# Patient Record
Sex: Female | Born: 1940 | Race: White
Health system: Southern US, Community
[De-identification: ages and names within clinical notes are randomized; demographics above are authoritative.]

## PROBLEM LIST (undated history)

## (undated) DIAGNOSIS — E039 Hypothyroidism, unspecified: Secondary | ICD-10-CM

## (undated) DIAGNOSIS — G629 Polyneuropathy, unspecified: Secondary | ICD-10-CM

## (undated) DIAGNOSIS — E7800 Pure hypercholesterolemia, unspecified: Secondary | ICD-10-CM

## (undated) DIAGNOSIS — M4802 Spinal stenosis, cervical region: Secondary | ICD-10-CM

## (undated) HISTORY — DX: Pure hypercholesterolemia, unspecified: E78.00

## (undated) HISTORY — PX: BACK SURGERY: SHX140

## (undated) HISTORY — DX: Hypothyroidism, unspecified: E03.9

## (undated) HISTORY — DX: Polyneuropathy, unspecified: G62.9

## (undated) HISTORY — PX: LAPAROSCOPIC HYSTERECTOMY: SHX1926

## (undated) HISTORY — PX: APPENDECTOMY: SHX54

## (undated) HISTORY — PX: ECTOPIC PREGNANCY SURGERY: SHX613

---
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
## 1969-09-09 HISTORY — PX: LAMINECTOMY, ANTERIOR LUMBAR DECOMPRESSION, LEVEL 1: SHX4440

## 1997-12-24 ENCOUNTER — Emergency Department: Admit: 1997-12-24 | Payer: Self-pay | Source: Emergency Department | Admitting: Emergency Medicine

## 2003-09-10 HISTORY — PX: PELVIC FLOOR REPAIR: SHX2192

## 2003-09-10 HISTORY — PX: HYSTERECTOMY, ABDOMINAL, TOTAL: SHX4214

## 2011-09-10 HISTORY — PX: COLONOSCOPY: SHX174

## 2014-06-28 DIAGNOSIS — M722 Plantar fascial fibromatosis: Secondary | ICD-10-CM | POA: Insufficient documentation

## 2014-06-28 DIAGNOSIS — E7800 Pure hypercholesterolemia, unspecified: Secondary | ICD-10-CM | POA: Insufficient documentation

## 2015-04-03 ENCOUNTER — Ambulatory Visit
Admission: RE | Admit: 2015-04-03 | Discharge: 2015-04-03 | Disposition: A | Discharge status: Home / Self Care | Payer: Medicare Other | Source: Ambulatory Visit | Attending: Family Medicine | Admitting: Family Medicine

## 2015-04-03 ENCOUNTER — Other Ambulatory Visit: Payer: Self-pay | Admitting: Family Medicine

## 2015-04-03 DIAGNOSIS — M25551 Pain in right hip: Secondary | ICD-10-CM

## 2015-04-05 ENCOUNTER — Other Ambulatory Visit: Payer: Self-pay | Admitting: Family Medicine

## 2015-04-05 ENCOUNTER — Ambulatory Visit
Admission: RE | Admit: 2015-04-05 | Discharge: 2015-04-05 | Disposition: A | Discharge status: Home / Self Care | Payer: Medicare Other | Source: Ambulatory Visit | Attending: Family Medicine | Admitting: Family Medicine

## 2015-04-05 DIAGNOSIS — M25551 Pain in right hip: Secondary | ICD-10-CM

## 2015-04-11 ENCOUNTER — Other Ambulatory Visit: Payer: Self-pay | Admitting: Family Medicine

## 2015-04-11 DIAGNOSIS — M25551 Pain in right hip: Secondary | ICD-10-CM

## 2015-04-11 DIAGNOSIS — M79604 Pain in right leg: Secondary | ICD-10-CM

## 2015-04-11 DIAGNOSIS — M5416 Radiculopathy, lumbar region: Secondary | ICD-10-CM

## 2015-04-14 ENCOUNTER — Ambulatory Visit
Admission: RE | Admit: 2015-04-14 | Discharge: 2015-04-14 | Disposition: A | Discharge status: Home / Self Care | Payer: Medicare Other | Source: Ambulatory Visit | Attending: Family Medicine | Admitting: Family Medicine

## 2015-04-14 DIAGNOSIS — M25551 Pain in right hip: Secondary | ICD-10-CM

## 2015-04-14 DIAGNOSIS — M5416 Radiculopathy, lumbar region: Secondary | ICD-10-CM

## 2015-04-14 DIAGNOSIS — M79604 Pain in right leg: Secondary | ICD-10-CM

## 2015-04-18 ENCOUNTER — Other Ambulatory Visit: Payer: Self-pay | Admitting: Family Medicine

## 2015-04-18 DIAGNOSIS — M5416 Radiculopathy, lumbar region: Secondary | ICD-10-CM

## 2015-04-28 ENCOUNTER — Other Ambulatory Visit: Payer: Self-pay | Admitting: Family Medicine

## 2015-04-28 ENCOUNTER — Ambulatory Visit
Admission: RE | Admit: 2015-04-28 | Discharge: 2015-04-28 | Disposition: A | Discharge status: Home / Self Care | Payer: Medicare Other | Source: Ambulatory Visit | Attending: Family Medicine | Admitting: Family Medicine

## 2015-04-28 DIAGNOSIS — M5416 Radiculopathy, lumbar region: Secondary | ICD-10-CM

## 2015-04-28 MED ORDER — IOHEXOL 180 MG/ML  SOLN: 1.0000 mL | Freq: Once | INTRAMUSCULAR | Status: DC | PRN

## 2015-04-28 MED ORDER — METHYLPREDNISOLONE ACETATE 40 MG/ML INJ SUSP (RADIOLOG: 120.0000 mg | Freq: Once | INTRAMUSCULAR | Status: DC

## 2015-04-28 NOTE — Discharge Instructions (Signed)

## 2015-10-26 ENCOUNTER — Other Ambulatory Visit (HOSPITAL_COMMUNITY): Payer: Self-pay | Admitting: Family Medicine

## 2015-10-26 DIAGNOSIS — R011 Cardiac murmur, unspecified: Secondary | ICD-10-CM

## 2015-10-31 DIAGNOSIS — R011 Cardiac murmur, unspecified: Secondary | ICD-10-CM | POA: Insufficient documentation

## 2015-10-31 DIAGNOSIS — I3489 Other nonrheumatic mitral valve disorders: Secondary | ICD-10-CM | POA: Insufficient documentation

## 2015-10-31 DIAGNOSIS — I493 Ventricular premature depolarization: Secondary | ICD-10-CM | POA: Insufficient documentation

## 2015-11-06 ENCOUNTER — Other Ambulatory Visit (HOSPITAL_COMMUNITY): Payer: Medicare Other

## 2015-11-13 ENCOUNTER — Other Ambulatory Visit: Payer: Self-pay | Admitting: Family Medicine

## 2015-11-13 ENCOUNTER — Ambulatory Visit (INDEPENDENT_AMBULATORY_CARE_PROVIDER_SITE_OTHER): Payer: Medicare Other

## 2015-11-13 ENCOUNTER — Ambulatory Visit
Admission: RE | Admit: 2015-11-13 | Discharge: 2015-11-13 | Disposition: A | Discharge status: Home / Self Care | Payer: Medicare Other | Source: Ambulatory Visit | Attending: Family Medicine | Admitting: Family Medicine

## 2015-11-13 ENCOUNTER — Other Ambulatory Visit: Payer: Self-pay

## 2015-11-13 ENCOUNTER — Ambulatory Visit (HOSPITAL_COMMUNITY): Payer: Medicare Other | Attending: Cardiovascular Disease

## 2015-11-13 DIAGNOSIS — Z8249 Family history of ischemic heart disease and other diseases of the circulatory system: Secondary | ICD-10-CM | POA: Diagnosis not present

## 2015-11-13 DIAGNOSIS — I340 Nonrheumatic mitral (valve) insufficiency: Secondary | ICD-10-CM | POA: Insufficient documentation

## 2015-11-13 DIAGNOSIS — I493 Ventricular premature depolarization: Secondary | ICD-10-CM

## 2015-11-13 DIAGNOSIS — R0600 Dyspnea, unspecified: Secondary | ICD-10-CM

## 2015-11-13 DIAGNOSIS — R011 Cardiac murmur, unspecified: Secondary | ICD-10-CM

## 2015-11-13 DIAGNOSIS — I071 Rheumatic tricuspid insufficiency: Secondary | ICD-10-CM | POA: Diagnosis not present

## 2016-11-13 ENCOUNTER — Other Ambulatory Visit: Payer: Self-pay | Admitting: Family Medicine

## 2016-11-13 DIAGNOSIS — R270 Ataxia, unspecified: Secondary | ICD-10-CM

## 2016-11-22 ENCOUNTER — Ambulatory Visit
Admission: RE | Admit: 2016-11-22 | Discharge: 2016-11-22 | Disposition: A | Discharge status: Home / Self Care | Payer: Medicare Other | Source: Ambulatory Visit | Attending: Family Medicine | Admitting: Family Medicine

## 2016-11-22 DIAGNOSIS — R270 Ataxia, unspecified: Secondary | ICD-10-CM

## 2016-11-26 ENCOUNTER — Ambulatory Visit (INDEPENDENT_AMBULATORY_CARE_PROVIDER_SITE_OTHER): Payer: Medicare Other | Admitting: Neurology

## 2016-11-26 ENCOUNTER — Encounter: Payer: Self-pay | Admitting: Neurology

## 2016-11-26 VITALS — BP 142/74 | HR 78 | Ht 61.0 in | Wt 126.6 lb

## 2016-11-26 DIAGNOSIS — R269 Unspecified abnormalities of gait and mobility: Secondary | ICD-10-CM

## 2016-11-26 DIAGNOSIS — G629 Polyneuropathy, unspecified: Secondary | ICD-10-CM | POA: Diagnosis not present

## 2016-11-26 DIAGNOSIS — G5730 Lesion of lateral popliteal nerve, unspecified lower limb: Secondary | ICD-10-CM | POA: Diagnosis not present

## 2016-11-26 DIAGNOSIS — M21372 Foot drop, left foot: Secondary | ICD-10-CM

## 2016-11-26 NOTE — Progress Notes (Signed)
[facility]    Provider:  Dr Lucia Gaskins Referring Provider: Mosetta Putt, MD Primary Care Physician:  Carolyne Fiscal, MD  CC:  Neuropathy  HPI:  [name] Trujillo is a 76 y.o. female here as a referral from Dr. Duaine Dredge for ataxia and weakness of the lower legs. 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 She has pain on the lateral side of her lower legs. Numbness or squeezing. She feels like she has something very tight on her legs. Her balance is worsening. She is constantly catching herself. She has had peripheral neuropathy for many years but the sensory changes on the lateral lower legs is new. She also has new weakness in the distal foot. Left foot is slapping when she walks. No new back pain. The pain starts at the knee. She had L2 radiculopathy and is feeling well. No weight loss recently. She denies crossing her legs. No boots or compressive etiology around the knee. She has had neuropathy for years with tingling in hte toes like her toes are asleep and she has some foot numbness which is chronic for 10 years or so and not worsening. The lateral lower leg symptoms started 6 months ago, the symptoms have progressed and recently she noticed the foot drop on the left. No inciting events, no falls. No knee swelling.    Reviewed notes, labs and imaging from outside physicians, which showed:  Reviewed primary care notes. Patient has had peripheral neuropathy since 2007. She has ataxia and paresthesias and weakness of the lower legs. She has had a negative evaluation for impaired glucose tolerance and B12 deficiency. She does have hypothyroidism that it has been well controlled.  She has chronic paresthesias in the lower legs and feet. She also has had significant arthritis of the feet and ankles for 7 years which is getting worse. She has noticed increasing symptoms of ataxia for the last year. She exhibited ataxia on exam. Unclear whether this was secondary to her orthopedic problems were that she has a neurologic cause. She does not have any dorsal or plantar flexion weakness but reported recently that she does have left plantar flexion weakness now. She has decreased sensation to pinprick below the knees bilaterally. She had an MRI of the brain scan on 11/22/2016 which was negative except for small vessel disease. She had a lumbar radiculopathy in 2016 with a disc extrusion on the right at L1-L2. This improved with an epidural injection.     Review of Systems: Patient complains of symptoms per HPI as well as the following symptoms: numbness, walking difficulty, no CP, no SOB. Pertinent negatives per HPI. All others negative.   Social History   Social History  . Marital status: Married    Spouse name: N/A  . Number of children: 7  . Years of education: 63   Occupational History  . Retired    Social History Main Topics  . Smoking status: Never Smoker  . Smokeless tobacco: Never Used  . Alcohol use Yes     Comment: once or twice a month  . Drug use: No  . Sexual activity: Not on file   Other Topics Concern  . Not on file   Social History Narrative   Lives at home w/ her husband and son  Right-handed   Caffeine: 1 cup of coffee in the morning    Family History  Problem Relation Age of Onset  . Neuropathy Neg Hx     Past Medical History:  Diagnosis Date  . High cholesterol   . Hypothyroidism   . Neuropathy [facility](HCC)     Past Surgical History:  Procedure Laterality Date  . APPEN[age]CTOMY     4717-20 yo  . BACK SURGERY     Age 76  . CESAREAN SECTION    . ECTOPIC PREGNANCY SURGERY    . LAPAROSCOPIC HYSTERECTOMY      Current Outpatient  Prescriptions  Medication Sig Dispense Refill  . levothyroxine (SYNTHROID, LEVOTHROID) 100 MCG tablet Take by mouth.    . pravastatin (PRAVACHOL) 20 MG tablet take 1 tablet by mouth once daily at bedtime after meals    . solifenacin (VESICARE) 10 MG tablet      No current facility-administered medications for this visit.     Allergies as of 11/26/2016 - Review Complete 11/26/2016  Allergen Reaction Noted  . Hydrocodone Nausea And Vomiting 04/28/2015    Vitals: BP (!) 142/74   Pulse 78   Ht 5\' 1"  (1.549 m)   Wt 126 lb 9.6 oz (57.4 kg)   BMI 23.92 kg/m  Last Weight:  Wt Readings from Last 1 Encounters:  11/26/16 126 lb 9.6 oz (57.4 kg)   Last Height:   Ht Readings from Last 1 Encounters:  11/26/16 5\' 1"  (1.549 m)   Physical exam: Exam: Gen: NAD, conversant, well groomed                     CV: RRR, no MRG. No Carotid Bruits. No peripheral edema, warm, nontender Eyes: Conjunctivae clear without exudates or hemorrhage  Neuro: Detailed Neurologic Exam  Speech:    Speech is normal; fluent and spontaneous with normal comprehension.  Cognition:    The patient is oriented to person, place, and time;     recent and remote memory intact;     language fluent;     normal attention, concentration,     fund of knowledge Cranial Nerves:    The pupils are equal, round, and reactive to light. The fundi are normal and spontaneous venous pulsations are present. Visual fields are full to finger confrontation. Extraocular movements are intact. Trigeminal sensation is intact and the muscles of mastication are normal. The face is symmetric. The palate elevates in the midline. Hearing intact. Voice is normal. Shoulder shrug is normal. The tongue has normal motion without fasciculations.   Coordination:    No dysmetria   Gait:   Cannot Heel-toe and tandem gait, imbalance with distal weakness left foot. Good stride when walking down hallway, normal width, dec arm swing on the right, foot  drop on the left.  Motor Observation:    no involuntary movements noted. Tone:    Normal muscle tone.    Posture:    Mildly stooped    Strength: R>L hip flexion mild weakness.   Strength is V/V in the upper and lower limbs.  Weakness of left DF 3+/5 and inversion 2/5. Impaired vibration distally in the toes     Sensation: No sognificant difference distally between foot and above knee pin prick per patient but there is Decreased Temp distally in a gradient fashion to the mid calf. There is relative numbness of the lateral vs medial lower leg. Dec vibration distally in the great toes       Reflex Exam:  DTR's:    Reduced AJs. Otherwise deep tendon reflexes in the upper and lower extremities are normal bilaterally.   Toes:    The toes are downgoing bilaterally.   Clonus:    Clonus is absent.       Assessment/Plan:   This is a lovely 76 y.o. female here as a referral from Dr. Duaine Dredge for ataxia and weakness of the lower legs. 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 She has a length dependent peripheral sensory neuropathy as well as what appears to be a peroneal neuropathy with left foot drop.   - emg/ncs bilateral lowers  to evaluate the peroneal neuropathy further, most common location is at the fibular head however she has no risk factors for this and would be unusual to have bilateral peroneal neuropathies, cannot rule out sciatic neuropathy vs lumbar radiculopathy. - B12, hgba1c normal. Will order serum neuropathy panel for less likely causes of neuropathy - Physical therapy for her distal weakness, foot drop as well as imbalance and gait abnormality - Increased risk of  falls, PT for balance and gait. - Will follow up with emg/ncs and further imaging may be warranted. - Spent an hour with patient discussing peripheral neuropathy. Discussed the causes of peripheral neuropathy, the most common being diabetes which patient does not report having. About 20 million people in the [place] states have some form of peripheral neuropathy. This is a condition that develops as a result of damage to the peripheral nervous system. 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 There are multiple causes including metabolic, toxic, infectious and endocrine disorders, small vessel disease, autoimmune diseases, and others.    Cc: Dr. Lavonia Dana, MD  [facility] [address] [place], [place] [address]  Phone [phone] Fax [phone]

## 2016-11-26 NOTE — Patient Instructions (Signed)
As far as diagnostic testing: emg/ncs, labs  I would like to see you back for emg/ncs, sooner if we need to. Please call us with any interim questions, concerns, problems, updates or refill requests.   Our phone number is [phone]. We also have an after hours call service for urgent matters and there is a physician on-call for urgent questions. For any emergencies you know to call 911 or go to the nearest emergency room   Common Peroneal Nerve Entrapment Common peroneal nerve entrapment is a condition that can make it hard to lift a foot. The condition results from pressure on a nerve in the lower leg called the common peroneal nerve. Your common peroneal nerve provides feeling to your outer lower leg and foot. It also supplies the muscles that move your foot and toes upward and outward. What are the causes? This condition may be caused by:  A hard, direct hit to the side of the lower leg.  Swelling from a knee injury.  A break (fracture) in one of the lower leg bones.  Wearing a boot or cast that ends just below the knee.  A growth or cyst near the nerve. What increases the risk? This condition is more likely to develop in people who play:  Contact sports, such as football or hockey.  Sports in which you wear high and stiff boots, such as skiing. What are the signs or symptoms? Symptoms of this condition include:  Trouble lifting your foot up.  Tripping often.  Your foot hitting the ground harder than normal as you walk.  Numbness, tingling, or pain in the outside of the knee, outside of the lower leg, and top of the foot.  Sensitivity to pressure on the front or side of the leg. How is this diagnosed? This condition may be diagnosed based on:  Your symptoms.  Your medical history.  A physical exam.  Tests, such as:  An X-ray to check the bones of your knee and leg.  MRI to check tendons that attach to the side of your knee.  An electromyogram (EMG) to check  your nerves. During your physical exam, your health care provider will check for numbness in your leg and test the strength of your lower leg muscles. He or she may tap the side of your lower leg to see if that causes tingling. How is this treated? Treatment for this condition may include:  Avoiding activities that make symptoms worse.  Using a brace to hold up your foot and toes.  Taking anti-inflammatory pain medicines to relieve swelling and reduce pain.  Having medicines injected into your ankle joint to reduce pain and swelling.  Physical therapy. This involves doing exercises.  Returning gradually to full activity.  Surgery to take pressure off the nerve. This may be needed if there is no improvement after 2-3 months or if there is a growth pushing on the nerve. Follow these instructions at home: If you have a brace:   Wear it as told by your health care provider. Remove it only as told by your health care provider.  Loosen the brace if your toes tingle, become numb, or turn cold and blue.  Keep the brace clean.  If the brace is not waterproof:  Do not let it get wet.  Cover it with a watertight covering when you take a bath or a shower.  Ask your health care provider when it is safe to drive with a brace on your foot. Activity  Return to your normal activities as told by your health care provider. Ask your health care provider what activities are safe for you.  Do not do any activities that make pain or swelling worse.  Do exercises as told by your health care provider. General instructions   Take over-the-counter and prescription medicines only as told by your health care provider.  Do not put your full weight on your knee until your health care provider says you can.  Keep all follow-up visits as told by your health care provider. This is important. How is this prevented?  Wear supportive footwear that is appropriate for your athletic activity.  Avoid  athletic activities that cause ankle pain or swelling.  Wear protective padding over your lower legs when playing contact sports.  Make sure your boots do not put extra pressure on the area just below your knees.  Do not sit cross-legged for long periods of time. Contact a health care provider if:  Your symptoms do not get better in 2-3 months.  The weakness or numbness in your leg or foot gets worse. This information is not intended to replace advice given to you by your health care provider. Make sure you discuss any questions you have with your health care provider. Document Released: 08/26/2005 Document Revised: 04/30/2016 Document Reviewed: 07/14/2015 Elsevier Interactive Patient Education  2017 [employer]

## 2016-11-27 LAB — PAN-ANCA
ANCA Proteinase 3: <3.5 U/mL (ref 0.0–3.5)
C-ANCA: <1:20 {titer}

## 2016-11-28 ENCOUNTER — Telehealth: Payer: Self-pay | Admitting: Neurology

## 2016-11-28 LAB — MULTIPLE MYELOMA PANEL, SERUM
Albumin SerPl Elph-Mcnc: 3.8 g/dL (ref 2.9–4.4)
Albumin/Glob SerPl: 1.3 (ref 0.7–1.7)
Alpha 1: 0.3 g/dL (ref 0.0–0.4)
Alpha2 Glob SerPl Elph-Mcnc: 0.7 g/dL (ref 0.4–1.0)
B-GLOBULIN SERPL ELPH-MCNC: 1 g/dL (ref 0.7–1.3)
Gamma Glob SerPl Elph-Mcnc: 1 g/dL (ref 0.4–1.8)
Globulin, Total: 3 g/dL (ref 2.2–3.9)
IGA/IMMUNOGLOBULIN A, SERUM: 113 mg/dL (ref 64–422)
IgG (Immunoglobin G), Serum: 840 mg/dL (ref 700–1600)
IgM (Immunoglobulin M), Srm: 42 mg/dL (ref 26–217)
TOTAL PROTEIN: 6.8 g/dL (ref 6.0–8.5)

## 2016-11-28 LAB — VITAMIN B6: Vitamin B6: 9.9 ug/L (ref 2.0–32.8)

## 2016-11-28 LAB — HEAVY METALS, BLOOD
ARSENIC: 6 ug/L (ref 2–23)
Lead, Blood: 2 ug/dL (ref 0–19)
Mercury: NOT DETECTED ug/L (ref 0.0–14.9)

## 2016-11-28 LAB — SJOGREN'S SYNDROME ANTIBODS(SSA + SSB)
ENA SSA (RO) Ab: <0.2 AI (ref 0.0–0.9)
ENA SSB (LA) Ab: <0.2 AI (ref 0.0–0.9)

## 2016-11-28 LAB — PAN-ANCA
ANCA Proteinase 3: <3.5 U/mL (ref 0.0–3.5)
Atypical pANCA: <1:20 {titer}
C-ANCA: <1:20 {titer}

## 2016-11-28 LAB — B. BURGDORFI ANTIBODIES: Lyme IgG/IgM Ab: <0.91 {ISR} (ref 0.00–0.90)

## 2016-11-28 LAB — SEDIMENTATION RATE: SED RATE: 6 mm/h (ref 0–40)

## 2016-11-28 LAB — VITAMIN B1

## 2016-11-28 LAB — ANA W/REFLEX: Anti Nuclear Antibody(ANA): NEGATIVE

## 2016-11-28 LAB — RHEUMATOID FACTOR

## 2016-11-28 NOTE — Telephone Encounter (Signed)
For pt's NCV/EMG, the earliest opening I saw was 4/19, but she had mentioned you would want to see her sooner. If you need me to do anything to schedule it, just let me know. Thanks!

## 2016-11-28 NOTE — Telephone Encounter (Signed)
[name] or [name] can you call and offer this to her please? Thanks!!!

## 2016-11-28 NOTE — Telephone Encounter (Signed)
For pt's NCV/EMG, the earliest opening I saw was 4/19, but she had mentioned you wanted to try and fit her in sooner. If you need me to do anything to schedule it, just let me know. Thanks!

## 2016-12-02 ENCOUNTER — Telehealth: Payer: Self-pay | Admitting: *Deleted

## 2016-12-02 NOTE — Telephone Encounter (Signed)
Attempted to reach patient with lab results. Husband answered phone; no DPR found. Will have to call back. Patient to be home by 4:30 per husband.

## 2016-12-03 ENCOUNTER — Ambulatory Visit: Payer: Medicare Other | Attending: Neurology | Admitting: Physical Therapy

## 2016-12-03 DIAGNOSIS — M21372 Foot drop, left foot: Secondary | ICD-10-CM | POA: Diagnosis present

## 2016-12-03 DIAGNOSIS — R260 Ataxic gait: Secondary | ICD-10-CM | POA: Diagnosis present

## 2016-12-03 DIAGNOSIS — M6281 Muscle weakness (generalized): Secondary | ICD-10-CM | POA: Insufficient documentation

## 2016-12-03 DIAGNOSIS — R2681 Unsteadiness on feet: Secondary | ICD-10-CM | POA: Diagnosis present

## 2016-12-03 NOTE — Telephone Encounter (Signed)
-----   Message from Anson FretAntonia B Ahern, MD sent at 11/28/2016  9:10 PM EDT ----- All labs normal

## 2016-12-03 NOTE — Telephone Encounter (Signed)
Called pt w/ normal lab results. May call back w/ additional questions/concerns. 

## 2016-12-04 ENCOUNTER — Ambulatory Visit: Payer: Medicare Other | Admitting: Physical Therapy

## 2016-12-04 DIAGNOSIS — M6281 Muscle weakness (generalized): Secondary | ICD-10-CM

## 2016-12-04 DIAGNOSIS — R260 Ataxic gait: Secondary | ICD-10-CM

## 2016-12-04 DIAGNOSIS — M21372 Foot drop, left foot: Secondary | ICD-10-CM

## 2016-12-04 DIAGNOSIS — R2681 Unsteadiness on feet: Secondary | ICD-10-CM | POA: Diagnosis not present

## 2016-12-04 NOTE — Therapy (Signed)
[facility] [place] [address]  [address] [place], [place], [address] Phone: [phone]   Fax:  [phone]  Physical Therapy Treatment  Patient Details  Name: [name] QuintCaryol Trujillo MRN: [id number] Date of Birth: [birth year] Referring Provider: Naomie DeanAntonia Ahern, MD  Encounter Date: 12/04/2016      PT End of Session - 12/04/16 1017    Visit Number 2   Number of Visits 16   Date for PT Re-Evaluation 02/07/17   Authorization Type UHC Medicare [place]   PT Start Time 1017   PT Stop Time 1109   PT Time Calculation (min) 52 min   Activity Tolerance Patient tolerated treatment well   Behavior During Therapy [facility] for tasks assessed/performed      Past Medical History:  Diagnosis Date  . High cholesterol   . Hypothyroidism   . Neuropathy [facility](HCC)     Past Surgical History:  Procedure Laterality Date  . APPEN[age]CTOMY     6117-20 yo  . BACK SURGERY     Age 76  . CESAREAN SECTION    . ECTOPIC PREGNANCY SURGERY    . LAPAROSCOPIC HYSTERECTOMY      There were no vitals filed for this visit.      Subjective Assessment - 12/04/16 1019    Subjective No new c/o or concerns.   Patient Stated Goals "to get my balance back so I can feel safer in the yard."   Currently in Pain? No/denies            [facility] - 12/03/16 1400      Assessment   Medical Diagnosis Abnormality of gait; L foot drop; Peripheral neuropathy   Referring Provider Naomie DeanAntonia Ahern, MD   Onset Date/Surgical Date --  1.5 yrs, worsening over past 6 months   Next MD Visit 12/18/16 for NCV study     Precautions   Precautions Fall     Balance Screen   Has the patient fallen in the past 6 months No   Has the patient had a decrease in activity level because of a fear of falling?  Yes   Is the patient reluctant to leave their home because of a fear of falling?  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[employer] Sneakers prior to herniated disk     Observation/Other Assessments   Focus on Therapeutic Outcomes (FOTO)  Foot - 47% (53% limitation); predicted 61% (39% limitation)     Sensation   Additional Comments pt describes sensation of tightness enveloping her lower legs     Coordination   Gross Motor Movements are Fluid and Coordinated No   Finger Nose Finger Test WNL   Heel Shin Test ataxia noted     ROM / Strength   AROM / PROM / Strength AROM;Strength     AROM   Overall AROM  Within functional limits for tasks performed   AROM Assessment Site Lumbar     Strength   Strength Assessment Site Hip;Knee;Ankle   Right/Left Hip Right;Left   Right Hip Flexion 4-/5   Right  Hip Extension 3+/5   Right Hip External Rotation  3+/5   Right Hip Internal Rotation 3+/5   Right Hip ABduction 4/5   Right Hip ADduction 4/5   Left Hip Flexion 4-/5   Left Hip Extension 3+/5   Left Hip External Rotation 3+/5   Left Hip Internal Rotation 3+/5   Left Hip ABduction 4/5   Left Hip ADduction 4/5   Right/Left Knee Right;Left   Right Knee Flexion 4/5   Right Knee Extension 4/5   Left Knee Flexion 4/5   Left Knee Extension 4/5   Right/Left Ankle Right;Left   Right Ankle Dorsiflexion 4-/5   Right Ankle Plantar Flexion 4-/5   Right Ankle Inversion 4-/5   Right Ankle Eversion 4-/5   Left Ankle Dorsiflexion 3/5   Left Ankle Plantar Flexion 3-/5   Left Ankle Inversion 3-/5   Left Ankle Eversion 3+/5     Ambulation/Gait   Ambulation/Gait Assistance 5: Supervision   Gait Pattern Step-through pattern;Decreased dorsiflexion - left;Lateral hip instability;Poor foot clearance - left  L foot slap   Ambulation  Surface Level;Indoor   Gait velocity 3.14 ft/sec   Stairs Yes   Stairs Assistance 5: Supervision   Stair Management Technique One rail Right;Alternating pattern;Forwards   Number of Stairs 13   Height of Stairs 7     Standardized Balance Assessment   Standardized Balance Assessment Berg Balance Test;Dynamic Gait Index;Timed Up and Go Test;10 meter walk test   10 Meter Walk 10.43     Berg Balance Test   Sit to Stand Able to stand without using hands and stabilize independently   Standing Unsupported Able to stand safely 2 minutes   Sitting with Back Unsupported but Feet Supported on Floor or Stool Able to sit safely and securely 2 minutes   Stand to Sit Uses backs of legs against chair to control descent   Transfers Able to transfer safely, minor use of hands   Standing Unsupported with Eyes Closed Needs help to keep from falling   Standing Ubsupported with Feet Together Able to place feet together independently and stand for 1 minute with supervision   From Standing, Reach Forward with Outstretched Arm Reaches forward but needs supervision   From Standing Position, Pick up Object from Floor Able to pick up shoe, needs supervision   From Standing Position, Turn to Look Behind Over each Shoulder Looks behind from both sides and weight shifts well   Turn 360 Degrees Needs close supervision or verbal cueing   Standing Unsupported, Alternately Place Feet on Step/Stool Able to complete 4 steps without aid or supervision   Standing Unsupported, One Foot in Front Able to take small step independently and hold 30 seconds   Standing on One Leg Tries to lift leg/unable to hold 3 seconds but remains standing independently   Total Score 35   Berg comment: < 36 high risk for falls (close to 100%)      Dynamic Gait Index   Level Surface Mild Impairment   Change in Gait Speed Moderate Impairment   Gait with Horizontal Head Turns Moderate Impairment   Gait with Vertical Head Turns Mild Impairment    Gait and Pivot Turn Mild Impairment   Step Over Obstacle Mild Impairment   Step Around Obstacles Mild Impairment   Steps Mild Impairment   Total Score 14   DGI comment: Scores of 19 or less are predictive of falls in older community living adults     Timed Up and Go Test  TUG Normal TUG   Normal TUG (seconds) 10.15                     [facility] Adult PT Treatment/Exercise - 12/04/16 1017      Knee/Hip Exercises: Standing   Hip Flexion Both;10 reps;Knee straight   Hip Flexion Limitations ab set + red TB   Hip ADduction Both;10 reps   Hip ADduction Limitations ab set + red TB   Hip Abduction Both;10 reps;Knee straight   Abduction Limitations ab set + red TB   Hip Extension Both;10 reps;Knee straight   Extension Limitations ab set + red TB     Knee/Hip Exercises: Supine   Bridges Limitations attempted but deferred d/t repeated cramping in HS   Straight Leg Raises Limitations attempted but deferred d/t repeated cramping in quads and HS   Other Supine Knee/Hip Exercises Abdominal bracing 10x5"     Ankle Exercises: Supine   T-Band Seated 4 way ankle x10 each - B PF with blue TB; L DF/IV/EV with yellow TB; R DF/EV with green TB, IV with red TB                 PT Education - 12/04/16 1109    Education provided Yes   Education Details Initial HEP   Person(s) Educated Patient   Methods Explanation;Demonstration;Handout   Comprehension Verbalized understanding;Returned demonstration;Need further instruction          PT Short Term Goals - 12/04/16 1109      PT SHORT TERM GOAL #1   Title Independent with initial HEP by 12/31/16   Status On-going     PT SHORT TERM GOAL #2   Title Berg >/= 42 to reduce risk for falls by 12/31/16   Status On-going           PT Long Term Goals - 12/04/16 1109      PT LONG TERM GOAL #1   Title Independent with advanced HEP by 02/07/17   Status On-going     PT LONG TERM GOAL #2   Title B LE strength >/= 4/5 for improved  gait stability by 02/07/17   Status On-going     PT LONG TERM GOAL #3   Title Berg >/= 48/56 to reduce risk for falls by 02/07/17   Status On-going     PT LONG TERM GOAL #4   Title DGI >/= 20/24 to improve gait safety and stability by 02/07/17   Status On-going     PT LONG TERM GOAL #5   Title Pt will ambulate with normal gait pattern w/o evidence of L foot drop/slap by 02/07/17   Status On-going               Plan - 12/04/16 1217    Clinical Impression Statement Pt preparing to leave on vacation for a week, therefore focus of therapy session was on creation of initial HEP for pt to work on while away. 4 way ankle exercises introduced with theraband resistance adjusted to accomodate for L drop foot. Pt experiencing repeated quad and HS cramping with attempts at supine proximal LE strengthening, therefore shifted focus to 4 resisted hip SLR + abdominal bracing in standing with pt demonstrating better tolerance.   Rehab Potential Good   Clinical Impairments Affecting Rehab Potential h/o scoliosis, DDD, neuropathy, remote h/o back surgery    PT Treatment/Interventions Patient/family education;Neuromuscular re-education;Balance training;Therapeutic exercise;Therapeutic activities;Functional mobility training;Gait training;Stair training;Manual techniques;Taping;Dry needling;Electrical Stimulation;ADLs/Self Care Home Management   PT Next Visit  Plan Review initial HEP; LE strengthening with emphasis on hip and ankles; Proprioceptive training   Consulted and Agree with Plan of Care Patient      Patient will benefit from skilled therapeutic intervention in order to improve the following deficits and impairments:  Decreased strength, Decreased balance, Decreased coordination, Impaired sensation, Abnormal gait, Difficulty walking, Decreased activity tolerance, Postural dysfunction  Visit Diagnosis: Unsteadiness on feet  Ataxic gait  Foot drop, left  Muscle weakness (generalized)        G-Codes - 12/31/2016 1449    Functional Assessment Tool Used (Outpatient Only) DGI = 14/24   Functional Limitation Mobility: Walking and moving around   Mobility: Walking and Moving Around Current Status [phone]) At least 40 percent but less than 60 percent impaired, limited or restricted   Mobility: Walking and Moving Around Goal Status [phone]) At least 20 percent but less than 40 percent impaired, limited or restricted      Problem List Patient Active Problem List   Diagnosis Date Noted  . Ventricular premature beats 10/31/2015  . Heart murmur 10/31/2015    Marry Guan, PT, MPT 12/04/2016, 12:25 PM  [facility] [address]  [address] [place], [place], [address] Phone: [phone]   Fax:  [phone]  Name: [name] Trujillo MRN: [id number] Date of Birth: 1941-06-05

## 2016-12-04 NOTE — Therapy (Signed)
[facility] [place] [address]  [address] [place], [place], [address] Phone: [phone]   Fax:  [phone]  Physical Therapy Evaluation  Patient Details  Name: [name] Trujillo MRN: [id number] Date of Birth: 1941-07-05 Referring Provider: Naomie DeanAntonia Ahern, MD  Encounter Date: 12/03/2016      PT End of Session - 12/03/16 1400    Visit Number 1   Number of Visits 16   Date for PT Re-Evaluation 02/07/17   Authorization Type UHC Medicare [place]   PT Start Time 1400   PT Stop Time 1449   PT Time Calculation (min) 49 min   Activity Tolerance Patient tolerated treatment well   Behavior During Therapy [facility] for tasks assessed/performed      Past Medical History:  Diagnosis Date  . High cholesterol   . Hypothyroidism   . Neuropathy [facility](HCC)     Past Surgical History:  Procedure Laterality Date  . APPENDECTOMY     69[age] yo  . BACK SURGERY     Age 76  . CESAREAN SECTION    . ECTOPIC PREGNANCY SURGERY    . LAPAROSCOPIC HYSTERECTOMY      There were no vitals filed for this visit.       Subjective Assessment - 12/03/16 1403    Subjective Pt reports h/o scoliosis as a child that has led her to stand with uneven posture. Over the past 1.5 yrs has noted gradual worsening of balance. Now notes lack of control in ankles, L>R, with inability to perform heel raise on L. Denies pain, but reports tightness in B calves.   Pertinent History Remote h/o surgery on L sciatic nerve; h/o lumbar HNP; pes planus   Diagnostic tests NCV study pending   Currently in Pain? No/denies            [facility] PT Assessment - 12/03/16 1400      Assessment   Medical Diagnosis Abnormality of gait; L foot drop; Peripheral neuropathy   Referring Provider Naomie DeanAntonia Ahern, MD   Onset Date/Surgical Date --  1.5 yrs, worsening over past 6 months   Next MD Visit 12/18/16 for NCV study     Precautions   Precautions Fall     Balance Screen   Has the patient  fallen in the past 6 months No   Has the patient had a decrease in activity level because of a fear of falling?  Yes   Is the patient reluctant to leave their home because of a fear of falling?  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[employer] Sne[employer] prior to herniated disk     Observation/Other Assessments   Focus on Therapeutic Outcomes (FOTO)  Foot - 47% (53% limitation); predicted 61% (39% limitation)     Sensation   Additional Comments pt describes sensation of tightness enveloping her lower legs     Coordination   Gross Motor Movements are Fluid and Coordinated No   Finger Nose Finger Test WNL   Heel Shin Test ataxia noted     ROM / Strength   AROM /  PROM / Strength AROM;Strength     AROM   Overall AROM  Within functional limits for tasks performed   AROM Assessment Site Lumbar     Strength   Strength Assessment Site Hip;Knee;Ankle   Right/Left Hip Right;Left   Right Hip Flexion 4-/5   Right Hip Extension 3+/5   Right Hip External Rotation  3+/5   Right Hip Internal Rotation 3+/5   Right Hip ABduction 4/5   Right Hip ADduction 4/5   Left Hip Flexion 4-/5   Left Hip Extension 3+/5   Left Hip External Rotation 3+/5   Left Hip Internal Rotation 3+/5   Left Hip ABduction 4/5   Left Hip ADduction 4/5   Right/Left Knee Right;Left   Right Knee Flexion 4/5   Right Knee Extension 4/5   Left Knee Flexion 4/5   Left Knee Extension 4/5   Right/Left Ankle Right;Left   Right Ankle Dorsiflexion 4-/5   Right Ankle Plantar Flexion 4-/5   Right Ankle Inversion 4-/5   Right Ankle Eversion 4-/5   Left Ankle Dorsiflexion 3/5    Left Ankle Plantar Flexion 3-/5   Left Ankle Inversion 3-/5   Left Ankle Eversion 3+/5     Ambulation/Gait   Ambulation/Gait Assistance 5: Supervision   Gait Pattern Step-through pattern;Decreased dorsiflexion - left;Lateral hip instability;Poor foot clearance - left  L foot slap   Ambulation Surface Level;Indoor   Gait velocity 3.14 ft/sec   Stairs Yes   Stairs Assistance 5: Supervision   Stair Management Technique One rail Right;Alternating pattern;Forwards   Number of Stairs 13   Height of Stairs 7     Standardized Balance Assessment   Standardized Balance Assessment Berg Balance Test;Dynamic Gait Index;Timed Up and Go Test;10 meter walk test   10 Meter Walk 10.43     Berg Balance Test   Sit to Stand Able to stand without using hands and stabilize independently   Standing Unsupported Able to stand safely 2 minutes   Sitting with Back Unsupported but Feet Supported on Floor or Stool Able to sit safely and securely 2 minutes   Stand to Sit Uses backs of legs against chair to control descent   Transfers Able to transfer safely, minor use of hands   Standing Unsupported with Eyes Closed Needs help to keep from falling   Standing Ubsupported with Feet Together Able to place feet together independently and stand for 1 minute with supervision   From Standing, Reach Forward with Outstretched Arm Reaches forward but needs supervision   From Standing Position, Pick up Object from Floor Able to pick up shoe, needs supervision   From Standing Position, Turn to Look Behind Over each Shoulder Looks behind from both sides and weight shifts well   Turn 360 Degrees Needs close supervision or verbal cueing   Standing Unsupported, Alternately Place Feet on Step/Stool Able to complete 4 steps without aid or supervision   Standing Unsupported, One Foot in Front Able to take small step independently and hold 30 seconds   Standing on One Leg Tries to lift leg/unable to hold 3 seconds but remains  standing independently   Total Score 35   Berg comment: < 36 high risk for falls (close to 100%)      Dynamic Gait Index   Level Surface Mild Impairment   Change in Gait Speed Moderate Impairment   Gait with Horizontal Head Turns Moderate Impairment   Gait with Vertical Head Turns Mild Impairment   Gait and Pivot Turn Mild Impairment  Step Over Obstacle Mild Impairment   Step Around Obstacles Mild Impairment   Steps Mild Impairment   Total Score 14   DGI comment: Scores of 19 or less are predictive of falls in older community living adults     Timed Up and Go Test   TUG Normal TUG   Normal TUG (seconds) 10.15                             PT Short Term Goals - 12-16-16 1449      PT SHORT TERM GOAL #1   Title Independent with initial HEP by 12/31/16   Status New     PT SHORT TERM GOAL #2   Title Berg >/= 42 to reduce risk for falls by 12/31/16   Status New           PT Long Term Goals - Dec 16, 2016 1449      PT LONG TERM GOAL #1   Title Independent with advanced HEP by 02/07/17   Status New     PT LONG TERM GOAL #2   Title B LE strength >/= 4/5 for improved gait stability by 02/07/17   Status New     PT LONG TERM GOAL #3   Title Berg >/= 48/56 to reduce risk for falls by 02/07/17   Status New     PT LONG TERM GOAL #4   Title DGI >/= 20/24 to improve gait safety and stability by 02/07/17   Status New     PT LONG TERM GOAL #5   Title Pt will ambulate with normal gait pattern w/o evidence of L foot drop/slap by 02/07/17   Status New               Plan - 12-16-16 1449    Clinical Impression Statement [name] Trujillo is a 76 y/o female who presents to OP with L foot drop, peroneal neuropathy and gait instability. Pt reporting gradual worsening of balance over past 1.5 years with recent awareness of l foot slap when walking. Assessment revealed mildly ataxic gait with decreased L foot clearance and lack of eccentric DF control on weight acceptance. MMT  revealed significant LE weakness, most pronounced in L foot/ ankle and proximally in B hips with deficits in LE gross motor coordination. Balance testing per Sharlene Motts (36/56) and DGI (14/24) revealed high risk for falls. Pt demonstrates good potential to benefit from skilled PT for LE strengthening, balance and coordination training, and dynamic gait training to improve safety with gait and mobility and reduce risk for falls.   Rehab Potential Good   Clinical Impairments Affecting Rehab Potential h/o scoliosis, DDD, neuropathy, remote h/o back surgery    PT Frequency 2x / week   PT Duration 8 weeks   PT Treatment/Interventions Patient/family education;Neuromuscular re-education;Balance training;Therapeutic exercise;Therapeutic activities;Functional mobility training;Gait training;Stair training;Manual techniques;Taping;Dry needling;Electrical Stimulation;ADLs/Self Care Home Management   PT Next Visit Plan Create initial HEP for LE strengthening   Consulted and Agree with Plan of Care Patient      Patient will benefit from skilled therapeutic intervention in order to improve the following deficits and impairments:  Decreased strength, Decreased balance, Decreased coordination, Impaired sensation, Abnormal gait, Difficulty walking, Decreased activity tolerance, Postural dysfunction  Visit Diagnosis: Unsteadiness on feet  Ataxic gait  Foot drop, left  Muscle weakness (generalized)      G-Codes - 2016/12/16 1449    Functional Assessment Tool Used (Outpatient Only) DGI = 14/24  Functional Limitation Mobility: Walking and moving around   Mobility: Walking and Moving Around Current Status [phone]) At least 40 percent but less than 60 percent impaired, limited or restricted   Mobility: Walking and Moving Around Goal Status [phone]) At least 20 percent but less than 40 percent impaired, limited or restricted       Problem List Patient Active Problem List   Diagnosis Date Noted  . Ventricular  premature beats 10/31/2015  . Heart murmur 10/31/2015    Marry Guan, PT, MPT 12/04/2016, 8:25 AM  [facility] [address]  [address] [place], [place], [address] Phone: [phone]   Fax:  [phone]  Name: [name] Trujillo MRN: [id number] Date of Birth: March 28, 1941

## 2016-12-18 ENCOUNTER — Ambulatory Visit (INDEPENDENT_AMBULATORY_CARE_PROVIDER_SITE_OTHER): Payer: Medicare Other | Admitting: Neurology

## 2016-12-18 ENCOUNTER — Ambulatory Visit (INDEPENDENT_AMBULATORY_CARE_PROVIDER_SITE_OTHER): Payer: Self-pay | Admitting: Neurology

## 2016-12-18 DIAGNOSIS — M21372 Foot drop, left foot: Secondary | ICD-10-CM

## 2016-12-18 DIAGNOSIS — R202 Paresthesia of skin: Secondary | ICD-10-CM | POA: Diagnosis not present

## 2016-12-18 DIAGNOSIS — M5416 Radiculopathy, lumbar region: Secondary | ICD-10-CM

## 2016-12-18 DIAGNOSIS — G5730 Lesion of lateral popliteal nerve, unspecified lower limb: Secondary | ICD-10-CM

## 2016-12-18 DIAGNOSIS — Z0289 Encounter for other administrative examinations: Secondary | ICD-10-CM

## 2016-12-18 DIAGNOSIS — R29898 Other symptoms and signs involving the musculoskeletal system: Secondary | ICD-10-CM

## 2016-12-18 NOTE — Progress Notes (Addendum)
Full Name: [name] Trujillo Gender: Female MRN #: [id number] Date of Birth: [birth year]    Visit Date: 12/18/2016 08:48 Age: 76 Years 9 Months Old Examining Physician: Naomie Dean, MD  Referring Physician: Lucia Gaskins, MD  History: This is a lovely 76 y.o. female here as a referral from Dr. Duaine Dredge for progressive ataxia and weakness of the lower legs. 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   Extensive lab testing including pan-Anca, B12, HgbA1c, vitamin B1, Lyme, double myeloma panel, B6, heavy metals, rheumatoid factor, Sjogren's, ANA with reflex and sedimentation rate were normal.    Summary: nerve conduction studies were completed on the bilateral lower extremities and left upper extremity.  The left peroneal motor nerve (recording from the tibialis anterior)  Showed decreased amplitude ( 1.80mV, normal greater than 3). The left peroneal motor nerve (recording from the extensor digitorum brevis) showed no response.  The right peroneal motor nerve (recording from the extensor digitorum brevis) showed delayed latency (8.1 ms, normal less than 6.5) and decreased amplitude (0.7 mV, normal greater than 2).. The left tibial motor nerve showed delayed latency (7.6 ms, normal less than 5.8) and decreased amplitude (0.2 mV, Normal greater than 4). The right tibial motor nerve showed delayed latency (8.2 ms, normal less than 5.8) and decreased amplitude (0.6 mV, Normal greater than 4). The left radial sensor nerve showed delayed latency (3.02 ms, normal less than 2.9) and decreased amplitude (13 V, normal greater than 15). The bilateral sural sensory nerves showed no response. The bilateral superficial sensory nerves  showed no response. The left ulnar orthodromic sensory nerve showed delayed latency (3.33 ms, normal less than 3.1)  The left tibialis anterior muscle showed increased spontaneous activity (fibrillations and positive sharp waves), prolonged motor unit duration, polyphasic motor units and diminished motor unit recruitment. The left tibialis posterior showed increased spontaneous activity (positive sharp waves), increased motor unit amplitude, and diminished motor unit recruitment. The left peroneal longus showed increased spontaneous activity (positive sharp waves), increased motor unit amplitude, prolonged motor unit duration and diminished motor unit recruitment. The left biceps femoris showed increased spontaneous activity (myotonia).  The left medial gastrocnemius showed increased spontaneous activity (positive sharp waves), increased motor unit amplitude, prolonged motor unit duration and diminished motor unit recruitment. The right tibialis anterior showed increased spontaneous activity (positive sharp waves), prolonged motor unit duration and polyphasic motor units with diminished motor unit recruitment. The right medial gastrocnemius showed increased spontaneous activity (positive sharp waves), increased motor unit amplitude, prolonged motor unit duration, polyphasic motor units and diminished motor unit recruitment.   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 There is acute/ongoing denervation and chronic neurogenic changes bilaterally in the distal lower extremities. The left leg is more affected than the right. Cannot rule out left greater than right L5/S1 radiculopathy. Recommend MRI of the lumbar spine.   Naomie Dean, M.D.  [facility] [address] [place], [place] [address] Tel: [phone] Fax: [phone]        [facility]    Nerve / Sites Rec. Site Latency Ref. Amplitude Ref. Rel Amp Segments  Distance Velocity Ref. Area    ms ms mV mV %  cm m/s m/s mVms  L Ulnar - ADM  Wrist ADM 2.9 ?3.3 8.8 ?6.0 100 Wrist - ADM 7   27.6     B.Elbow ADM 6.0  7.3  83 B.Elbow - Wrist 16 52 ?49 22.8     A.Elbow ADM 8.1  7.2  98.3 A.Elbow - B.Elbow 11 52 ?49 23.2         A.Elbow - Wrist      L Peroneal - EDB     Ankle EDB NR ?6.5 NR ?2.0 NR Ankle - EDB 9   NR     Fib head EDB NR  NR  NR Fib head - Ankle   ?44 NR         Pop fossa - Ankle      R Peroneal - EDB     Ankle EDB 8.1 ?6.5 0.7 ?2.0 100 Ankle - EDB 9   1.0     Fib head EDB 14.9  0.5  67.9 Fib head - Ankle 27 40 ?44 0.7     Pop fossa EDB 17.4  0.4  92.3 Pop fossa - Fib head 10 40 ?44 1.3         Pop fossa - Ankle      L Tibial - AH     Ankle AH 7.6 ?5.8 0.2 ?4.0 100 Ankle - AH 9   0.5     Pop fossa AH 17.9  0.2  99.3 Pop fossa - Ankle 35 34 ?41 0.9  R Tibial - AH     Ankle AH 8.2 ?5.8 0.6 ?4.0 100 Ankle - AH 9   1.4     Pop fossa AH 17.9  0.6  98 Pop fossa - Ankle 35 36 ?41 1.2  L Peroneal - Tib Ant     Fib Head Tib Ant 5.2 ?6.7 1.2 ?3.0 100 Fib Head - Tib Ant 10   7.9     Pop fossa Tib Ant 7.1  1.1  85.9 Pop fossa - Fib Head 9 45 ?44 6.9                 SNC    Nerve / Sites Rec. Site Peak Lat Ref.  Amp Ref. Segments Distance    ms ms V V  cm  L Radial - Anatomical snuff box (Forearm)     Forearm Wrist 3.02 ?2.90 13 ?15 Forearm - Wrist 10  L Sural - Ankle (Calf)     Calf Ankle NR ?4.40 NR ?6 Calf - Ankle 14  R Sural - Ankle (Calf)     Calf Ankle NR ?4.40 NR ?6 Calf - Ankle 14  L Superficial peroneal - Ankle     Lat leg Ankle NR ?4.40 NR ?6 Lat leg - Ankle 14  R Superficial peroneal - Ankle     Lat leg Ankle NR ?4.40 NR ?6 Lat leg - Ankle 14  L Ulnar - Orthodromic, (Dig V, Mid palm)     Dig V Wrist 3.33 ?3.10 5 ?5 Dig V - Wrist 51                 F  Wave    Nerve F Lat Ref.   ms ms  L Tibial - AH 62.9 ?56.0  R Tibial - AH 61.2 ?56.0  L Ulnar - ADM 28.4 ?32.0           EMG full       EMG Summary Table     Spontaneous MUAP Recruitment  Muscle IA Fib PSW Fasc Other Amp  Dur. Poly Pattern  L. Vastus medialis Normal None None None _______ Normal Normal Normal Normal  L. Tibialis anterior Normal +2 +3 None _______ Normal prolonged +1 reduced  L. Tibialis posterior Normal None +3 None _______ +1 Normal Normal reduced  L. Peroneus longus Normal None +4 None _______ +2 prolonged +1 reduced  L. Abductor hallucis Normal None None None _______ Normal Normal Normal Normal  L. Biceps femoris (long head) Increased None None None Myotonia Normal Normal Normal Normal  L. Gluteus maximus Normal None None None _______ Normal Normal Normal Normal  Left Medial Gastroc Normal None +3 None  +1 prolonged Normal reduced  L. Gluteus medius Normal None None None _______ Normal Normal Normal Normal  L.Abductor Hallucis Decreased    _______      L iliopsoas Nomal None None None _______ Normal Normal Normal Normal       EMG Summary Table    Spontaneous MUAP Recruitment  Muscle IA Fib PSW Fasc Other Amp Dur. Poly Pattern  R. Vastus medialis Normal None None None _______ Normal Normal Normal Normal  R. Tibialis anterior Normal None +1 None _______ Normal prolonged +1 reduced  R. Peroneus longus Normal None None None _______ Normal Normal Normal Normal  R. Biceps femoris (long head) Normal None None None _______ Normal Normal Normal Normal  R. Gluteus maximus Normal None None None _______ Normal Normal Normal Normal  R. Medial Gastroc Normal None +1 None  +1 prolonged +1 reduced

## 2016-12-19 ENCOUNTER — Ambulatory Visit: Payer: Medicare Other

## 2016-12-19 NOTE — Progress Notes (Signed)
See procedure note.

## 2016-12-23 ENCOUNTER — Ambulatory Visit: Payer: Medicare Other

## 2016-12-26 ENCOUNTER — Ambulatory Visit: Payer: Medicare Other | Admitting: Physical Therapy

## 2016-12-29 ENCOUNTER — Ambulatory Visit
Admission: RE | Admit: 2016-12-29 | Discharge: 2016-12-29 | Disposition: A | Discharge status: Home / Self Care | Payer: Medicare Other | Source: Ambulatory Visit | Attending: Neurology | Admitting: Neurology

## 2016-12-29 DIAGNOSIS — G5730 Lesion of lateral popliteal nerve, unspecified lower limb: Secondary | ICD-10-CM

## 2016-12-29 DIAGNOSIS — M21372 Foot drop, left foot: Secondary | ICD-10-CM

## 2016-12-29 DIAGNOSIS — R29898 Other symptoms and signs involving the musculoskeletal system: Secondary | ICD-10-CM

## 2016-12-29 DIAGNOSIS — M5416 Radiculopathy, lumbar region: Secondary | ICD-10-CM

## 2016-12-30 ENCOUNTER — Ambulatory Visit: Payer: Medicare Other

## 2016-12-30 NOTE — Procedures (Signed)
Full Name: [name] Trujillo Gender: Female MRN #: [id number] Date of Birth: 1941-05-01    Visit Date: 12/18/2016 08:48 Age: 76 Years 9 Months Old Examining Physician: Naomie Dean, MD  Referring Physician: Lucia Gaskins, MD  History: This is a lovely 76 y.o. female here as a referral from Dr. Duaine Dredge for progressive ataxia and weakness of the lower legs. 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   Extensive lab testing including pan-Anca, B12, HgbA1c, vitamin B1, Lyme, double myeloma panel, B6, heavy metals, rheumatoid factor, Sjogren's, ANA with reflex and sedimentation rate were normal.    Summary: nerve conduction studies were completed on the bilateral lower extremities and left upper extremity.  The left peroneal motor nerve (recording from the tibialis anterior)  Showed decreased amplitude ( 1.76mV, normal greater than 3). The left peroneal motor nerve (recording from the extensor digitorum brevis) showed no response.  The right peroneal motor nerve (recording from the extensor digitorum brevis) showed delayed latency (8.1 ms, normal less than 6.5) and decreased amplitude (0.7 mV, normal greater than 2).. The left tibial motor nerve showed delayed latency (7.6 ms, normal less than 5.8) and decreased amplitude (0.2 mV, Normal greater than 4). The right tibial motor nerve showed delayed latency (8.2 ms, normal less than 5.8) and decreased amplitude (0.6 mV, Normal greater than 4). The left radial sensor nerve showed delayed latency (3.02 ms, normal less than 2.9) and decreased amplitude (13 V, normal greater than 15). The bilateral sural sensory nerves showed no response. The bilateral superficial sensory nerves  showed no response. The left ulnar orthodromic sensory nerve showed delayed latency (3.33 ms, normal less than 3.1)  The left tibialis anterior muscle showed increased spontaneous activity (fibrillations and positive sharp waves), prolonged motor unit duration, polyphasic motor units and diminished motor unit recruitment. The left tibialis posterior showed increased spontaneous activity (positive sharp waves), increased motor unit amplitude, and diminished motor unit recruitment. The left peroneal longus showed increased spontaneous activity (positive sharp waves), increased motor unit amplitude, prolonged motor unit duration and diminished motor unit recruitment. The left biceps femoris showed increased spontaneous activity (myotonia).  The left medial gastrocnemius showed increased spontaneous activity (positive sharp waves), increased motor unit amplitude, prolonged motor unit duration and diminished motor unit recruitment. The right tibialis anterior showed increased spontaneous activity (positive sharp waves), prolonged motor unit duration and polyphasic motor units with diminished motor unit recruitment. The right medial gastrocnemius showed increased spontaneous activity (positive sharp waves), increased motor unit amplitude, prolonged motor unit duration, polyphasic motor units and diminished motor unit recruitment.   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 There is acute/ongoing denervation and chronic neurogenic changes bilaterally in the distal lower extremities. The left leg is more affected than the right. Cannot rule out left greater than right L5/S1 radiculopathy. Recommend MRI of the lumbar spine.   Naomie Dean, M.D.  [facility] [address] [place], [place] [address] Tel: [phone] Fax: [phone]        [facility]    Nerve / Sites Rec. Site Latency Ref. Amplitude Ref. Rel Amp Segments  Distance Velocity Ref. Area    ms ms mV mV %  cm m/s m/s mVms  L Ulnar - ADM  Wrist ADM 2.9 ?3.3 8.8 ?6.0 100 Wrist - ADM 7   27.6     B.Elbow ADM 6.0  7.3  83 B.Elbow - Wrist 16 52 ?49 22.8     A.Elbow ADM 8.1  7.2  98.3 A.Elbow - B.Elbow 11 52 ?49 23.2         A.Elbow - Wrist      L Peroneal - EDB     Ankle EDB NR ?6.5 NR ?2.0 NR Ankle - EDB 9   NR     Fib head EDB NR  NR  NR Fib head - Ankle   ?44 NR         Pop fossa - Ankle      R Peroneal - EDB     Ankle EDB 8.1 ?6.5 0.7 ?2.0 100 Ankle - EDB 9   1.0     Fib head EDB 14.9  0.5  67.9 Fib head - Ankle 27 40 ?44 0.7     Pop fossa EDB 17.4  0.4  92.3 Pop fossa - Fib head 10 40 ?44 1.3         Pop fossa - Ankle      L Tibial - AH     Ankle AH 7.6 ?5.8 0.2 ?4.0 100 Ankle - AH 9   0.5     Pop fossa AH 17.9  0.2  99.3 Pop fossa - Ankle 35 34 ?41 0.9  R Tibial - AH     Ankle AH 8.2 ?5.8 0.6 ?4.0 100 Ankle - AH 9   1.4     Pop fossa AH 17.9  0.6  98 Pop fossa - Ankle 35 36 ?41 1.2  L Peroneal - Tib Ant     Fib Head Tib Ant 5.2 ?6.7 1.2 ?3.0 100 Fib Head - Tib Ant 10   7.9     Pop fossa Tib Ant 7.1  1.1  85.9 Pop fossa - Fib Head 9 45 ?44 6.9                 SNC    Nerve / Sites Rec. Site Peak Lat Ref.  Amp Ref. Segments Distance    ms ms V V  cm  L Radial - Anatomical snuff box (Forearm)     Forearm Wrist 3.02 ?2.90 13 ?15 Forearm - Wrist 10  L Sural - Ankle (Calf)     Calf Ankle NR ?4.40 NR ?6 Calf - Ankle 14  R Sural - Ankle (Calf)     Calf Ankle NR ?4.40 NR ?6 Calf - Ankle 14  L Superficial peroneal - Ankle     Lat leg Ankle NR ?4.40 NR ?6 Lat leg - Ankle 14  R Superficial peroneal - Ankle     Lat leg Ankle NR ?4.40 NR ?6 Lat leg - Ankle 14  L Ulnar - Orthodromic, (Dig V, Mid palm)     Dig V Wrist 3.33 ?3.10 5 ?5 Dig V - Wrist 51                 F  Wave    Nerve F Lat Ref.   ms ms  L Tibial - AH 62.9 ?56.0  R Tibial - AH 61.2 ?56.0  L Ulnar - ADM 28.4 ?32.0           EMG full       EMG Summary Table     Spontaneous MUAP Recruitment  Muscle IA Fib PSW Fasc Other Amp  Dur. Poly Pattern  L. Vastus medialis Normal None None None _______ Normal Normal Normal Normal  L. Tibialis anterior Normal +2 +3 None _______ Normal prolonged +1 reduced  L. Tibialis posterior Normal None +3 None _______ +1 Normal Normal reduced  L. Peroneus longus Normal None +4 None _______ +2 prolonged +1 reduced  L. Abductor hallucis Normal None None None _______ Normal Normal Normal Normal  L. Biceps femoris (long head) Increased None None None Myotonia Normal Normal Normal Normal  L. Gluteus maximus Normal None None None _______ Normal Normal Normal Normal  Left Medial Gastroc Normal None +3 None  +1 prolonged Normal reduced  L. Gluteus medius Normal None None None _______ Normal Normal Normal Normal  L.Abductor Hallucis Decreased    _______      L iliopsoas Nomal None None None _______ Normal Normal Normal Normal       EMG Summary Table    Spontaneous MUAP Recruitment  Muscle IA Fib PSW Fasc Other Amp Dur. Poly Pattern  R. Vastus medialis Normal None None None _______ Normal Normal Normal Normal  R. Tibialis anterior Normal None +1 None _______ Normal prolonged +1 reduced  R. Peroneus longus Normal None None None _______ Normal Normal Normal Normal  R. Biceps femoris (long head) Normal None None None _______ Normal Normal Normal Normal  R. Gluteus maximus Normal None None None _______ Normal Normal Normal Normal  R. Medial Gastroc Normal None +1 None  +1 prolonged +1 reduced

## 2017-01-02 ENCOUNTER — Ambulatory Visit: Payer: Medicare Other | Admitting: Physical Therapy

## 2017-01-06 ENCOUNTER — Ambulatory Visit: Payer: Medicare Other | Attending: Neurology | Admitting: Physical Therapy

## 2017-01-06 ENCOUNTER — Telehealth: Payer: Self-pay

## 2017-01-06 DIAGNOSIS — G5730 Lesion of lateral popliteal nerve, unspecified lower limb: Secondary | ICD-10-CM

## 2017-01-06 DIAGNOSIS — R260 Ataxic gait: Secondary | ICD-10-CM

## 2017-01-06 DIAGNOSIS — M6281 Muscle weakness (generalized): Secondary | ICD-10-CM | POA: Insufficient documentation

## 2017-01-06 DIAGNOSIS — R269 Unspecified abnormalities of gait and mobility: Secondary | ICD-10-CM

## 2017-01-06 DIAGNOSIS — M21372 Foot drop, left foot: Secondary | ICD-10-CM

## 2017-01-06 DIAGNOSIS — R2681 Unsteadiness on feet: Secondary | ICD-10-CM | POA: Insufficient documentation

## 2017-01-06 NOTE — Telephone Encounter (Signed)
New orders for ongoing outpatient therapy placed in EPIC.

## 2017-01-06 NOTE — Telephone Encounter (Signed)
-----   Message from Christ Kick Trujillo sent at 01/06/2017  3:21 PM EDT ----- Regarding: FW: New Referral   ----- Message ----- From: Macario Golds Sent: 01/06/2017   9:18 AM To: Dan Humphreys, Marry Guan, PT Subject: New Referral                                   Hi Dana,  It's been over 30 days since we have seen [name] Trujillo and she is scheduled for an appointment this afternoon  Can we please get a new referral  Thanks,  Darl Pikes

## 2017-01-06 NOTE — Therapy (Signed)
[facility] [address]  [address] [place], [place], [address] Phone: [phone]   Fax:  [phone]  Physical Therapy Treatment  Patient Details  Name: [name] Trujillo MRN: [id number] Date of Birth: Apr 14, 1941 Referring Provider: Naomie Dean, MD  Encounter Date: 01/06/2017      PT End of Session - 01/06/17 1458    Visit Number 3   Number of Visits 16   Date for PT Re-Evaluation 02/07/17   Authorization Type [employer] Medicare - [place]: [place]   PT Start Time 1401   PT Stop Time 1453   PT Time Calculation (min) 52 min   Activity Tolerance Patient tolerated treatment well   Behavior During Therapy [facility] for tasks assessed/performed      Past Medical History:  Diagnosis Date  . High cholesterol   . Hypothyroidism   . Neuropathy [facility])     Past Surgical History:  Procedure Laterality Date  . APPEN[age]CTOMY     49-18 yo  . BACK SURGERY     Age 54  . CESAREAN SECTION    . ECTOPIC PREGNANCY SURGERY    . LAPAROSCOPIC HYSTERECTOMY      There were no vitals filed for this visit.      Subjective Assessment - 01/06/17 1417    Subjective Pt reporting difficulty with HEP; noting increasing numbness and tingling in LE after performing the exercises, so she has stopped. Pt reports she had her NCV test which showed "ongoing" denervation and was referred for an MRI for her lumbar spine. Has not heard the MRI results.   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 There is acute/ongoing denervation and chronic neurogenic changes bilaterally in the distal lower extremities. The left leg is more affected than the right. Cannot rule out left greater than right L5/S1 radiculopathy. Recommend MRI of the lumbar spine."  12/29/16 - Lumbar MRI: "Abnormal MRI lumbar spine  (without) demonstrating: 1. At L2-3: disc bulging and facet hypertrophy with mild spinal stenosis and moderate left foraminal stenosis. 2. At L5-S1: disc bulging and facet hypertrophy with moderate-severe left foraminal stenosis. 3. At L4-5: disc bulging and facet hypertrophy with mild right and moderate left foraminal stenosis. 4. At L1-2: disc bulging and facet hypertrophy with moderate right and mild left foraminal stenosis. 5. Additional degenerative spine changes as above. 6. Compared to MRI on 04/14/15, the L1-2 disc extrusion is no longer seen. Elsewhere the degenerative changes have mildly worsened."   Patient Stated Goals "to get my balance back so I can feel safer in the yard."   Currently in Pain? Yes   Pain Score 0-No pain   Pain Location Leg   Pain Orientation Right;Left   Pain Descriptors / Indicators Numbness;Tingling   Pain Radiating Towards R calf into foot; L ankle into foot   Pain Onset More than a month ago   Pain Frequency Intermittent            [facility] PT Assessment - 01/06/17 1401      Flexibility   Soft Tissue Assessment /Muscle Length yes   Hamstrings moderate tightness B   Quadriceps mild/mod hip flexor tightness B   ITB mod tightness B   Piriformis moderate/severe tightness B                     [facility]  Adult PT Treatment/Exercise - 01/06/17 1401      Knee/Hip Exercises: Stretches   Passive Hamstring Stretch Both;30 seconds;3 reps   Passive Hamstring Stretch Limitations 1st rep manual, then supine with strap   Hip Flexor Stretch Both;30 seconds;2 reps   Hip Flexor Stretch Limitations mod thomas with strap   ITB Stretch Both;30 seconds;2 reps   ITB Stretch Limitations supine with strap   Piriformis Stretch Both;30 seconds;2 reps  each   Piriformis Stretch Limitations figure 4 with overpressure +/- strap at foot; KTOS                PT Education - 01/06/17 1410    Education Details Reviewed NCV & MRI findings with pt, answering  questions and explaining impact on PT POC   Person(s) Educated Patient   Methods Explanation   Comprehension Verbalized understanding          PT Short Term Goals - 01/06/17 1545      PT SHORT TERM GOAL #1   Title Independent with initial HEP by 01/24/17   Status Revised     PT SHORT TERM GOAL #2   Title Berg >/= 42 to reduce risk for falls by 01/24/17   Status Revised           PT Long Term Goals - 12/04/16 1109      PT LONG TERM GOAL #1   Title Independent with advanced HEP by 02/07/17   Status On-going     PT LONG TERM GOAL #2   Title B LE strength >/= 4/5 for improved gait stability by 02/07/17   Status On-going     PT LONG TERM GOAL #3   Title Berg >/= 48/56 to reduce risk for falls by 02/07/17   Status On-going     PT LONG TERM GOAL #4   Title DGI >/= 20/24 to improve gait safety and stability by 02/07/17   Status On-going     PT LONG TERM GOAL #5   Title Pt will ambulate with normal gait pattern w/o evidence of L foot drop/slap by 02/07/17   Status On-going               Plan - 01/06/17 1451    Clinical Impression Statement Pt returning to PT after 30 day absence from PT due to vacation followed by hold per MD awaiting NCV and MRI testing. Pt noting worsening neuropathy in B LE, R > L, and reports she has stopped doing her HEP exercises as these seem to make the numbness and tingling worse. MRI reviewed revealing mutilevel degenerative changes, disk protrusions and spinal & foraminal stenosis which may contribute to LE neuropathy, but pt has yet to formally f/u with MD rearding MRI results. As such, remained conservative with therapt today, focusing on LE stretching to address muscle tightness and instructed pt to continue to defer initial HEP until able to review exercises in therapy. Pt also presented PT with secondary referral from ortho for bilateral posterior tibial tendon dysfunction, for which PT will more formally evaluate at next visit.   Rehab Potential  Good   Clinical Impairments Affecting Rehab Potential h/o scoliosis, DDD, neuropathy, remote h/o back surgery    PT Treatment/Interventions Patient/family education;Neuromuscular re-education;Balance training;Therapeutic exercise;Therapeutic activities;Functional mobility training;Gait training;Stair training;Manual techniques;Taping;Dry needling;Electrical Stimulation;ADLs/Self Care Home Management   PT Next Visit Plan Eval for B posterior tibial tendon dysfunction; Review stretching HEP & initial HEP as indicated; LE strengthening with emphasis on hip and ankles; Proprioceptive training  Consulted and Agree with Plan of Care Patient      Patient will benefit from skilled therapeutic intervention in order to improve the following deficits and impairments:  Decreased strength, Decreased balance, Decreased coordination, Impaired sensation, Abnormal gait, Difficulty walking, Decreased activity tolerance, Postural dysfunction  Visit Diagnosis: Unsteadiness on feet  Ataxic gait  Foot drop, left  Muscle weakness (generalized)     Problem List Patient Active Problem List   Diagnosis Date Noted  . Ventricular premature beats 10/31/2015  . Heart murmur 10/31/2015    Marry Guan, PT, MPT 01/06/2017, 4:05 PM  [facility] [address]  [address] [place], [place], [address] Phone: [phone]   Fax:  [phone]  Name: Madeleine Fenn MRN: [id number] Date of Birth: December 06, 1940

## 2017-01-08 ENCOUNTER — Telehealth: Payer: Self-pay

## 2017-01-08 DIAGNOSIS — R937 Abnormal findings on diagnostic imaging of other parts of musculoskeletal system: Secondary | ICD-10-CM

## 2017-01-08 DIAGNOSIS — M21371 Foot drop, right foot: Secondary | ICD-10-CM

## 2017-01-08 NOTE — Telephone Encounter (Signed)
-----   Message from Anson Fret, MD sent at 01/01/2017  8:14 AM EDT ----- Patient has a pinched nerve In the back that corresponds to the left foot drop. I think she needs neurosurgical evaluation for this. Please discuss with patient and let me know or just order it. thanks

## 2017-01-08 NOTE — Telephone Encounter (Signed)
Called pt w/ MRI results and recommendations for neurosurg referral. Results faxed to PCP (Dr. April Holding # [phone]) as requested. Verbalized understanding and appreciation for call.

## 2017-01-09 ENCOUNTER — Ambulatory Visit: Payer: Medicare Other | Attending: Neurology | Admitting: Physical Therapy

## 2017-01-09 DIAGNOSIS — M21372 Foot drop, left foot: Secondary | ICD-10-CM | POA: Diagnosis present

## 2017-01-09 DIAGNOSIS — R260 Ataxic gait: Secondary | ICD-10-CM | POA: Diagnosis present

## 2017-01-09 DIAGNOSIS — R2681 Unsteadiness on feet: Secondary | ICD-10-CM | POA: Insufficient documentation

## 2017-01-09 DIAGNOSIS — M6281 Muscle weakness (generalized): Secondary | ICD-10-CM

## 2017-01-09 NOTE — Therapy (Signed)
[facility] [address]  [address] [place], [place], [address] Phone: [phone]   Fax:  [phone]  Physical Therapy Treatment  Patient Details  Name: [name] Trujillo MRN: [id number] Date of Birth: December 10, 1940 Referring Provider: Naomie Dean, MD  Encounter Date: 01/09/2017      PT End of Session - 01/09/17 1400    Visit Number 4   Number of Visits 16   Date for PT Re-Evaluation 02/07/17   Authorization Type UHC Medicare [place]: [place]   PT Start Time 1401   PT Stop Time 1449   PT Time Calculation (min) 48 min   Activity Tolerance Patient tolerated treatment well   Behavior During Therapy [facility] for tasks assessed/performed      Past Medical History:  Diagnosis Date  . High cholesterol   . Hypothyroidism   . Neuropathy [facility])     Past Surgical History:  Procedure Laterality Date  . APPENDECTOMY     39-18 yo  . BACK SURGERY     Age 58  . CESAREAN SECTION    . ECTOPIC PREGNANCY SURGERY    . LAPAROSCOPIC HYSTERECTOMY      There were no vitals filed for this visit.      Subjective Assessment - 01/09/17 1405    Subjective Pt reporting good compliance with home stretches, but did request clarification/confirmation of 1 stretch. Denies pain today, but still experiencing some numbness.   Pertinent History Remote h/o surgery on L sciatic nerve; h/o lumbar HNP; pes planus   Patient Stated Goals "to get my balance back so I can feel safer in the yard."   Currently in Pain? No/denies   Pain Score 0-No pain   Pain Location Leg   Pain Orientation Right;Left   Pain Descriptors / Indicators Numbness;Tingling   Pain Onset More than a month ago            [facility] PT Assessment - 01/09/17 1400      ROM / Strength   AROM / PROM / Strength AROM;PROM;Strength     AROM   AROM Assessment Site Ankle   Right/Left Ankle Right;Left   Right Ankle Dorsiflexion 3   Right Ankle Plantar Flexion 56   Right Ankle Inversion 26   Right Ankle Eversion 18   Left Ankle Dorsiflexion -2   Left Ankle Plantar Flexion 54   Left Ankle Inversion 10   Left Ankle Eversion 20     PROM   PROM Assessment Site Ankle   Right/Left Ankle Left   Left Ankle Inversion 25     Strength   Right Ankle Dorsiflexion 4-/5   Right Ankle Plantar Flexion 4-/5   Right Ankle Inversion 4-/5   Right Ankle Eversion 4-/5   Left Ankle Dorsiflexion 3-/5   Left Ankle Plantar Flexion 3-/5   Left Ankle Inversion 2+/5   Left Ankle Eversion 3+/5                     [facility] Adult PT Treatment/Exercise - 01/09/17 1400      Knee/Hip Exercises: Stretches   Piriformis Stretch Left;30 seconds;1 rep   Piriformis Stretch Limitations figure 4 with overpressure +/- strap at foot;     Knee/Hip Exercises: Aerobic   Nustep lvl 4 x 6'     Ankle Exercises: Sidelying   Ankle Inversion Left;20 reps     Ankle Exercises: Supine   Isometrics B ankle inversion isometric with pillow squeeze 10x5"  Ankle Exercises: Seated   Ankle Circles/Pumps Left;10 reps  each (DF & circles)   Toe Raise 10 reps;3 seconds   Toe Raise Limitations limited range on L                  PT Short Term Goals - 01/09/17 1449      PT SHORT TERM GOAL #1   Title Independent with initial HEP by 01/24/17   Status On-going     PT SHORT TERM GOAL #2   Title Berg >/= 42 to reduce risk for falls by 01/24/17   Status On-going           PT Long Term Goals - 12/04/16 1109      PT LONG TERM GOAL #1   Title Independent with advanced HEP by 02/07/17   Status On-going     PT LONG TERM GOAL #2   Title B LE strength >/= 4/5 for improved gait stability by 02/07/17   Status On-going     PT LONG TERM GOAL #3   Title Berg >/= 48/56 to reduce risk for falls by 02/07/17   Status On-going     PT LONG TERM GOAL #4   Title DGI >/= 20/24 to improve gait safety and stability by 02/07/17   Status On-going     PT LONG TERM GOAL #5   Title Pt will ambulate with normal gait  pattern w/o evidence of L foot drop/slap by 02/07/17   Status On-going               Plan - 01/09/17 1449    Clinical Impression Statement Pt currently active with PT from neurology referral for L foot drop, peroneal neuropathy and gait instability and now presents with new referral from ortho PA, Alfredo MartinezJustin Ollis, 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 Pt reporting difficulty with initial HEP provided upon initial eval > 1 month ago, therefore revamped L ankle exercises with AROM & isometric strengthening focus due to poor control with theraband exercises. Will reassess remainder of initial HEP at next visit.   Rehab Potential Good   Clinical Impairments Affecting Rehab Potential h/o scoliosis, DDD, neuropathy, remote h/o back surgery    PT Treatment/Interventions Patient/family education;Neuromuscular re-education;Balance training;Therapeutic exercise;Therapeutic activities;Functional mobility training;Gait training;Stair training;Manual techniques;Taping;Dry needling;Electrical Stimulation;ADLs/Self Care Home Management   PT Next Visit Plan Review stretching HEP & proximal strengthening exercises from initial HEP as indicated; Core & LE strengthening with emphasis on hip and ankles; Proprioceptive training   Consulted and Agree with Plan of Care Patient      Patient will benefit from skilled therapeutic intervention in order to improve the following deficits and impairments:  Decreased strength, Decreased balance, Decreased coordination, Impaired sensation, Abnormal gait, Difficulty walking, Decreased activity tolerance, Postural dysfunction  Visit Diagnosis: Unsteadiness on feet  Ataxic gait  Foot drop, left  Muscle weakness (generalized)     Problem  List Patient Active Problem List   Diagnosis Date Noted  . Ventricular premature beats 10/31/2015  . Heart murmur 10/31/2015    Marry GuanJoAnne M Forest Pruden, PT, MPT 01/09/2017, 6:25 PM  [facility] [address]  [address] [place], [place], [address] Phone: [phone]   Fax:  [phone]  Name: Biagio QuintCaryol Berrong MRN: [id number] Date of Birth: 07-13-1941

## 2017-01-10 NOTE — Addendum Note (Signed)
Addended by: Donnelly AngelicaHOGAN, JENNIFER L on: 01/10/2017 11:45 AM   Modules accepted: Orders

## 2017-01-10 NOTE — Telephone Encounter (Signed)
[name] Trujillo/Dr Duaine DredgeBlomgren [phone].called wanting to know where the pt will be referred for neurosurg? Call and ask for [name] PrinceSherri Trujillo

## 2017-01-13 ENCOUNTER — Ambulatory Visit: Payer: Medicare Other

## 2017-01-13 DIAGNOSIS — R2681 Unsteadiness on feet: Secondary | ICD-10-CM

## 2017-01-13 DIAGNOSIS — R260 Ataxic gait: Secondary | ICD-10-CM

## 2017-01-13 DIAGNOSIS — M6281 Muscle weakness (generalized): Secondary | ICD-10-CM

## 2017-01-13 DIAGNOSIS — M21372 Foot drop, left foot: Secondary | ICD-10-CM

## 2017-01-13 NOTE — Therapy (Signed)
[facility] [address]  [address] [place], [place], [address] Phone: [phone]   Fax:  [phone]  Physical Therapy Treatment  Patient Details  Name: [name] QuintCaryol Trujillo MRN: [id number] Date of Birth: 09/21/1940 Referring Provider: Naomie DeanAntonia Ahern, MD  Encounter Date: 01/13/2017      PT End of Session - 01/13/17 1406    Visit Number 5   Number of Visits 16   Date for PT Re-Evaluation 02/07/17   Authorization Type UHC Medicare - [place]: [place]   PT Start Time 1402   PT Stop Time 1445   PT Time Calculation (min) 43 min   Activity Tolerance Patient tolerated treatment well   Behavior [facility] for tasks assessed/performed      Past Medical History:  Diagnosis Date  . High cholesterol   . Hypothyroidism   . Neuropathy [facility])     Past Surgical History:  Procedure Laterality Date  . APPENDECTOMY     4917-20 yo  . BACK SURGERY     Age 76  . CESAREAN SECTION    . ECTOPIC PREGNANCY SURGERY    . LAPAROSCOPIC HYSTERECTOMY      There were no vitals filed for this visit.      Subjective Assessment - 01/13/17 1404    Subjective Pt. reporting she wishes to review two stretches from HEP due to not being sure she is doing them correctly.     Patient Stated Goals "to get my balance back so I can feel safer in the yard."   Currently in Pain? No/denies   Pain Score 0-No pain   Multiple Pain Sites No                         [facility] Adult PT Treatment/Exercise - 01/13/17 1422      Self-Care   Self-Care Other Self-Care Comments   Other Self-Care Comments  Review of previous HEP activities for appropriateness      Knee/Hip Exercises: Stretches   Hip Flexor Stretch Right;1 rep;30 seconds  reviewed upon pt. request    Hip Flexor Stretch Limitations mod thomas with strap   Piriformis Stretch Right;1 rep;30 seconds  reviewed upon pt. request    Piriformis Stretch Limitations figure 4 with overpressure with strap       Knee/Hip Exercises: Aerobic   Recumbent Bike level 2, 6 min      Knee/Hip Exercises: Standing   Hip Flexion 10 reps;Knee straight;Right;Left;Stengthening  2 ski pole support    Hip Flexion Limitations standing on airex pad    Hip Abduction 10 reps;Knee straight;Right;Left;Stengthening  2 ski pole support    Abduction Limitations standing on airex pad    Hip Extension 10 reps;Knee straight;Right;Left;Stengthening  2 ski pole support    Extension Limitations standing on airex pad      Ankle Exercises: Seated   Heel Raises 10 reps;3 seconds  leaning on knees    Toe Raise 10 reps;3 seconds   Other Seated Ankle Exercises BAPS board side<>side, front/back (level 1) x 20 reps each way                PT Education - 01/13/17 1610    Education provided Yes   Education Details Seated adduction pillow squeeze, Previous HEP reviewed for appropriateness (instructed to continue performing standing core bracing, pelvic tilt, and continue all standing SLR activities without band except standing SLR adduction)  PT Short Term Goals - 01/09/17 1449      PT SHORT TERM GOAL #1   Title Independent with initial HEP by 01/24/17   Status On-going     PT SHORT TERM GOAL #2   Title Berg >/= 42 to reduce risk for falls by 01/24/17   Status On-going           PT Long Term Goals - 12/04/16 1109      PT LONG TERM GOAL #1   Title Independent with advanced HEP by 02/07/17   Status On-going     PT LONG TERM GOAL #2   Title B LE strength >/= 4/5 for improved gait stability by 02/07/17   Status On-going     PT LONG TERM GOAL #3   Title Berg >/= 48/56 to reduce risk for falls by 02/07/17   Status On-going     PT LONG TERM GOAL #4   Title DGI >/= 20/24 to improve gait safety and stability by 02/07/17   Status On-going     PT LONG TERM GOAL #5   Title Pt will ambulate with normal gait pattern w/o evidence of L foot drop/slap by 02/07/17   Status On-going                Plan - 01/13/17 1612    Clinical Impression Statement Pt. with some R hip tightness to start treatment, which resolved with warmup.  Some review of LE stretches upon pt. request today with min cueing for proper technique.  Reviewed past HEP for appropriateness with able to perform core bracing and SLR without TB well thus these kept for use in current HEP.  Pt. tolerated addition of weighted 3-way SLR on compliant surface without pain today.  Will plan to progress hip/ankle strengthening and proprioception in coming visits.     PT Treatment/Interventions Patient/family education;Neuromuscular re-education;Balance training;Therapeutic exercise;Therapeutic activities;Functional mobility training;Gait training;Stair training;Manual techniques;Taping;Dry needling;Electrical Stimulation;ADLs/Self Care Home Management   PT Next Visit Plan Check for tolerance for HEP prn; Core & LE strengthening with emphasis on hip and ankles; Proprioceptive training      Patient will benefit from skilled therapeutic intervention in order to improve the following deficits and impairments:  Decreased strength, Decreased balance, Decreased coordination, Impaired sensation, Abnormal gait, Difficulty walking, Decreased activity tolerance, Postural dysfunction  Visit Diagnosis: Unsteadiness on feet  Ataxic gait  Foot drop, left  Muscle weakness (generalized)     Problem List Patient Active Problem List   Diagnosis Date Noted  . Ventricular premature beats 10/31/2015  . Heart murmur 10/31/2015    Kermit Balo, PTA 01/13/17 6:18 PM  [facility] [address]  [address] [place], [place], [address] Phone: [phone]   Fax:  [phone]  Name: [name] Trujillo MRN: [id number] Date of Birth: Oct 22, 1940

## 2017-01-13 NOTE — Telephone Encounter (Signed)
Sherri with Dr. Duaine DredgeBlomgren called office in reference to neurosurgery referral Dr. Lucia GaskinsAhern has done and would like to know which neurosurgeon patient is being referred to.  Please call

## 2017-01-16 ENCOUNTER — Ambulatory Visit: Payer: Medicare Other | Admitting: Physical Therapy

## 2017-01-16 DIAGNOSIS — M21372 Foot drop, left foot: Secondary | ICD-10-CM

## 2017-01-16 DIAGNOSIS — M6281 Muscle weakness (generalized): Secondary | ICD-10-CM

## 2017-01-16 DIAGNOSIS — R2681 Unsteadiness on feet: Secondary | ICD-10-CM

## 2017-01-16 DIAGNOSIS — R260 Ataxic gait: Secondary | ICD-10-CM

## 2017-01-16 NOTE — Therapy (Signed)
[facility] [address]  [place], [place], [address] Phone: [phone]   Fax:  [phone]  Physical Therapy Treatment  Patient Details  Name: [name] Trujillo MRN: [id number] Date of Birth: [birth year] Referring Provider: Sarina Ill, MD  Encounter Date: 01/16/2017      PT End of Session - 01/16/17 1405    Visit Number 6   Number of Visits 16   Date for PT Re-Evaluation 02/07/17   Authorization Type UHC Medicare [place]: [place]   PT Start Time 5176   PT Stop Time 1446   PT Time Calculation (min) 41 min   Activity Tolerance Patient tolerated treatment well   Behavior During Therapy [facility] for tasks assessed/performed      Past Medical History:  Diagnosis Date  . High cholesterol   . Hypothyroidism   . Neuropathy [facility])     Past Surgical History:  Procedure Laterality Date  . APPENDECTOMY     101-20 yo  . BACK SURGERY     Age 71  . CESAREAN SECTION    . ECTOPIC PREGNANCY SURGERY    . LAPAROSCOPIC HYSTERECTOMY      There were no vitals filed for this visit.      Subjective Assessment - 01/16/17 1408    Subjective Pt noting improving ability to lift her toes while walking. States she met with the neurosurgeon, who is not recommending surgery at this time.   Pertinent History Remote h/o surgery on L sciatic nerve; h/o lumbar HNP; pes planus   Patient Stated Goals "to get my balance back so I can feel safer in the yard."   Currently in Pain? No/denies                         [facility] Adult PT Treatment/Exercise - 01/16/17 1405      Knee/Hip Exercises: Aerobic   Recumbent Bike lvl 2 x 5:30     Knee/Hip Exercises: Standing   Heel Raises Both;5 reps;2 sets;3 seconds   Heel Raises Limitations B concentric/primarily L eccentric   Hip Flexion Both;10 reps;Knee straight;Stengthening   Hip Flexion Limitations ab set + yellow TB, UE support on back of chair   Hip ADduction Both;10  reps;Strengthening   Hip ADduction Limitations ab set + yellow TB, UE support on back of chair   Hip Abduction Both;10 reps;Knee straight;Stengthening   Abduction Limitations ab set + yellow TB, UE support on back of chair   Hip Extension Both;10 reps;Knee straight;Stengthening   Extension Limitations ab set + yellow TB, UE support on back of chair   Other Standing Knee Exercises L hip hike to neutral from edge of 2" step x10     Ankle Exercises: Sidelying   Ankle Inversion Left;20 reps     Ankle Exercises: Seated   Toe Raise 10 reps;3 seconds   Toe Raise Limitations limited range on L, but improved since intiial attempt                PT Education - 01/16/17 1650    Education provided Yes   Education Details Heel raises with L eccentric lowering added to HEP, reintroduced 4 way SLR with TB resistance (reduced to yellow)   Person(s) Educated Patient   Methods Explanation;Demonstration;Handout   Comprehension Verbalized understanding;Returned demonstration;Need further instruction          PT Short Term Goals - 01/09/17 1449      PT SHORT TERM GOAL #  1   Title Independent with initial HEP by 01/24/17   Status On-going     PT SHORT TERM GOAL #2   Title Berg >/= 42 to reduce risk for falls by 01/24/17   Status On-going           PT Long Term Goals - 12/04/16 1109      PT LONG TERM GOAL #1   Title Independent with advanced HEP by 02/07/17   Status On-going     PT LONG TERM GOAL #2   Title B LE strength >/= 4/5 for improved gait stability by 02/07/17   Status On-going     PT LONG TERM GOAL #3   Title Berg >/= 48/56 to reduce risk for falls by 02/07/17   Status On-going     PT LONG TERM GOAL #4   Title DGI >/= 20/24 to improve gait safety and stability by 02/07/17   Status On-going     PT LONG TERM GOAL #5   Title Pt will ambulate with normal gait pattern w/o evidence of L foot drop/slap by 02/07/17   Status On-going               Plan - 01/16/17 1417     Clinical Impression Statement Pt pleased with neurousrgery opinion that surgery not indicated at present. She feels that she is noting improving control of L ankle with increased active DF and more symmetrical circular pattern with ankle circles. Also noting improving ability to perform heel raises, with eccentric focus on L added to HEP along with reintroduction of B standing 4 way SLR with yellow TB. Pt cautioned to perform every other day initially and defer if increased pain occurs such as happened when previously introduced.   PT Treatment/Interventions Patient/family education;Neuromuscular re-education;Balance training;Therapeutic exercise;Therapeutic activities;Functional mobility training;Gait training;Stair training;Manual techniques;Taping;Dry needling;Electrical Stimulation;ADLs/Self Care Home Management   PT Next Visit Plan Check for tolerance for HEP prn; Core & LE strengthening with emphasis on hip and ankles; Proprioceptive training   Consulted and Agree with Plan of Care Patient      Patient will benefit from skilled therapeutic intervention in order to improve the following deficits and impairments:  Decreased strength, Decreased balance, Decreased coordination, Impaired sensation, Abnormal gait, Difficulty walking, Decreased activity tolerance, Postural dysfunction  Visit Diagnosis: Unsteadiness on feet  Ataxic gait  Foot drop, left  Muscle weakness (generalized)     Problem List Patient Active Problem List   Diagnosis Date Noted  . Ventricular premature beats 10/31/2015  . Heart murmur 10/31/2015    Percival Spanish, PT, MPT 01/16/2017, 4:59 PM  [facility] [address]  [place], [place], [address] Phone: [phone]   Fax:  [phone]  Name: [name] Trujillo MRN: [id number] Date of Birth: [birth year]

## 2017-01-20 ENCOUNTER — Ambulatory Visit: Payer: Medicare Other | Admitting: Physical Therapy

## 2017-01-20 DIAGNOSIS — M6281 Muscle weakness (generalized): Secondary | ICD-10-CM

## 2017-01-20 DIAGNOSIS — R2681 Unsteadiness on feet: Secondary | ICD-10-CM | POA: Diagnosis not present

## 2017-01-20 DIAGNOSIS — M21372 Foot drop, left foot: Secondary | ICD-10-CM

## 2017-01-20 DIAGNOSIS — R260 Ataxic gait: Secondary | ICD-10-CM

## 2017-01-20 NOTE — Therapy (Signed)
[facility] [place] [name]  [address] [place], [place], [address] Phone: [phone]   Fax:  [phone]  Physical Therapy Treatment  Patient Details  Name: [name] QuintCaryol Azbill MRN: [id number] Date of Birth: May 01, 1941 Referring Provider: Naomie DeanAntonia Ahern, MD  Encounter Date: 01/20/2017      PT End of Session - 01/20/17 1615    Visit Number 7   Number of Visits 16   Date for PT Re-Evaluation 02/07/17   Authorization Type UHC Medicare [place]   PT Start Time 1615   PT Stop Time 1705   PT Time Calculation (min) 50 min   Activity Tolerance Patient tolerated treatment well   Behavior During Therapy [facility] for tasks assessed/performed      Past Medical History:  Diagnosis Date  . High cholesterol   . Hypothyroidism   . Neuropathy [facility](HCC)     Past Surgical History:  Procedure Laterality Date  . APPEN[age]CTOMY     5117-20 yo  . BACK SURGERY     Age 76  . CESAREAN SECTION    . ECTOPIC PREGNANCY SURGERY    . LAPAROSCOPIC HYSTERECTOMY      There were no vitals filed for this visit.      Subjective Assessment - 01/20/17 1618    Subjective Pt reports she was able to work in the garden yesterday w/o any issues or increased pain. Still noting some discomfort with standing 4 way hip SLR with TB so she has been doing these with fewer reps and days off in between.   Pertinent History Remote h/o surgery on L sciatic nerve; h/o lumbar HNP; pes planus   Patient Stated Goals "to get my balance back so I can feel safer in the yard."   Currently in Pain? No/denies            [facility] PT Assessment - 01/20/17 1615      AROM   Right Ankle Dorsiflexion 5   Left Ankle Dorsiflexion 4                     [facility] Adult PT Treatment/Exercise - 01/20/17 1615      Knee/Hip Exercises: Aerobic   Recumbent Bike lvl 2 x 6'     Ankle Exercises: Seated   Toe Raise 20 reps;3 seconds   Toe Raise Limitations improving clearance   BAPS  Sitting;Level 2;10 reps  each   BAPS Weights (lbs) post only at PM, 1# cuff wt on post at AM, 2# cuff wt on post at AL   BAPS Limitations DF/PF tilt maintaining neutral lateral tilt with wts as indicated below; inversion/eversion w/o added wt x10   Other Seated Ankle Exercises B seated figure 4 inversion (post tibialis) x20     Ankle Exercises: Sidelying   Ankle Inversion Left;20 reps     Ankle Exercises: Stretches   Gastroc Stretch 30 seconds;2 reps   Gastroc Stretch Limitations bilateral    Restaurant manager, fast foodlant Board Stretch 30 seconds;2 reps   Slant Board Stretch Limitations bilateral                 PT Education - 01/20/17 1721    Education provided Yes   Education Details HEP update - gastroc/soleus stretches, figure 4 seated inversion (posterior tibialis) w/o resistance   Person(s) Educated Patient   Methods Explanation;Demonstration;Handout   Comprehension Verbalized understanding;Returned demonstration;Need further instruction          PT Short Term Goals - 01/20/17 1717  PT SHORT TERM GOAL #1   Title Independent with initial HEP by 01/24/17   Status Achieved     PT SHORT TERM GOAL #2   Title Berg >/= 42 to reduce risk for falls by 01/24/17   Status On-going           PT Long Term Goals - 12/04/16 1109      PT LONG TERM GOAL #1   Title Independent with advanced HEP by 02/07/17   Status On-going     PT LONG TERM GOAL #2   Title B LE strength >/= 4/5 for improved gait stability by 02/07/17   Status On-going     PT LONG TERM GOAL #3   Title Berg >/= 48/56 to reduce risk for falls by 02/07/17   Status On-going     PT LONG TERM GOAL #4   Title DGI >/= 20/24 to improve gait safety and stability by 02/07/17   Status On-going     PT LONG TERM GOAL #5   Title Pt will ambulate with normal gait pattern w/o evidence of L foot drop/slap by 02/07/17   Status On-going               Plan - 01/20/17 1615    Clinical Impression Statement Pt demostrating good progress  with ability to actively engage dorsiflexors on L but still noting increased difficulty engaging active inversion with posterior tibialis. DF AROM improved with current limitation more related to tightness in gastroc/soleus complex, therefore added stretches for these muscles to HEP. Pt noting some increased pain since reintroducing hip strengthening exercises to HEP, but has been able to mitigate this by reducing reps and frequency and was painfree today. Pt please with return of more active control of DF on L foot.   Rehab Potential Good   Clinical Impairments Affecting Rehab Potential h/o scoliosis, DDD, neuropathy, remote h/o back surgery    PT Treatment/Interventions Patient/family education;Neuromuscular re-education;Balance training;Therapeutic exercise;Therapeutic activities;Functional mobility training;Gait training;Stair training;Manual techniques;Taping;Dry needling;Electrical Stimulation;ADLs/Self Care Home Management   PT Next Visit Plan Check for tolerance for HEP prn; Core & LE strengthening with emphasis on hip and ankles; Proprioceptive training   Consulted and Agree with Plan of Care Patient      Patient will benefit from skilled therapeutic intervention in order to improve the following deficits and impairments:  Decreased strength, Decreased balance, Decreased coordination, Impaired sensation, Abnormal gait, Difficulty walking, Decreased activity tolerance, Postural dysfunction  Visit Diagnosis: Unsteadiness on feet  Ataxic gait  Foot drop, left  Muscle weakness (generalized)     Problem List Patient Active Problem List   Diagnosis Date Noted  . Ventricular premature beats 10/31/2015  . Heart murmur 10/31/2015    Marry Guan, PT, MPT 01/20/2017, 5:34 PM  [facility] [address]  [address] [place], [place], [address] Phone: [phone]   Fax:  [phone]  Name: [name] Trujillo MRN: [id number] Date of  Birth: 1941/04/19

## 2017-01-20 NOTE — Therapy (Signed)
[facility] [address]  [address] [place], [place], [address] Phone: [phone]   Fax:  [phone]  Physical Therapy Treatment  Patient Details  Name: Biagio QuintCaryol Nova MRN: [id number] Date of Birth: [birth year] Referring Provider: Naomie DeanAntonia Ahern, MD  Encounter Date: 01/09/2017      PT End of Session - 01/20/17 1615    Visit Number 7   Number of Visits 16   Date for PT Re-Evaluation 02/07/17   Authorization Type UHC Medicare - [place]: [place]   PT Start Time 1615   Activity Tolerance Patient tolerated treatment well   Behavior During Therapy [facility] for tasks assessed/performed      Past Medical History:  Diagnosis Date  . High cholesterol   . Hypothyroidism   . Neuropathy [facility](HCC)     Past Surgical History:  Procedure Laterality Date  . APPENDECTOMY     4017-20 yo  . BACK SURGERY     Age 76  . CESAREAN SECTION    . ECTOPIC PREGNANCY SURGERY    . LAPAROSCOPIC HYSTERECTOMY      There were no vitals filed for this visit.      Subjective Assessment - 01/20/17 1618    Subjective Pt reports she was able to work in the garden yesterday w/o any issues or increased pain. Still noting some discomfort with standing 4 way hip SLR with TB so she has been doing these with fewer reps and days off in between.   Pertinent History Remote h/o surgery on L sciatic nerve; h/o lumbar HNP; pes planus   Patient Stated Goals "to get my balance back so I can feel safer in the yard."   Currently in Pain? No/denies                         [facility] Treatment/Exercise - 01/20/17 1615      Knee/Hip Exercises: Aerobic   Recumbent Bike lvl 2 x 6'     Ankle Exercises: Seated   BAPS Sitting;Level 2;10 reps  each   BAPS Weights (lbs) post only at PM, 1# cuff wt on post at AM, 2# cuff wt on post at AL   BAPS Limitations DF/PF tilt maintaining neutral lateral tilt                  PT Short Term Goals - 01/09/17 1449      PT SHORT TERM GOAL #1   Title Independent with initial HEP by 01/24/17   Status On-going     PT SHORT TERM GOAL #2   Title Berg >/= 42 to reduce risk for falls by 01/24/17   Status On-going           PT Long Term Goals - 12/04/16 1109      PT LONG TERM GOAL #1   Title Independent with advanced HEP by 02/07/17   Status On-going     PT LONG TERM GOAL #2   Title B LE strength >/= 4/5 for improved gait stability by 02/07/17   Status On-going     PT LONG TERM GOAL #3   Title Berg >/= 48/56 to reduce risk for falls by 02/07/17   Status On-going     PT LONG TERM GOAL #4   Title DGI >/= 20/24 to improve gait safety and stability by 02/07/17   Status On-going     PT LONG TERM GOAL #5   Title Pt will ambulate with normal  gait pattern w/o evidence of L foot drop/slap by 02/07/17   Status On-going               Plan - 01/20/17 1615    PT Treatment/Interventions Patient/family education;Neuromuscular re-education;Balance training;Therapeutic exercise;Therapeutic activities;Functional mobility training;Gait training;Stair training;Manual techniques;Taping;Dry needling;Electrical Stimulation;ADLs/Self Care Home Management   PT Next Visit Plan Check for tolerance for HEP prn; Core & LE strengthening with emphasis on hip and ankles; Proprioceptive training   Consulted and Agree with Plan of Care Patient      Patient will benefit from skilled therapeutic intervention in order to improve the following deficits and impairments:  Decreased strength, Decreased balance, Decreased coordination, Impaired sensation, Abnormal gait, Difficulty walking, Decreased activity tolerance, Postural dysfunction  Visit Diagnosis: Unsteadiness on feet  Ataxic gait  Foot drop, left  Muscle weakness (generalized)     Problem List Patient Active Problem List   Diagnosis Date Noted  . Ventricular premature beats 10/31/2015  . Heart murmur 10/31/2015    Marry Guan 01/20/2017, 5:14 PM  [facility] [address]  [address] [place], [place], [address] Phone: [phone]   Fax:  [phone]  Name: Sophee Mckimmy MRN: [id number] Date of Birth: [birth year]

## 2017-01-23 ENCOUNTER — Ambulatory Visit: Payer: Medicare Other | Admitting: Physical Therapy

## 2017-01-23 DIAGNOSIS — R2681 Unsteadiness on feet: Secondary | ICD-10-CM | POA: Diagnosis not present

## 2017-01-23 DIAGNOSIS — M6281 Muscle weakness (generalized): Secondary | ICD-10-CM

## 2017-01-23 DIAGNOSIS — M21372 Foot drop, left foot: Secondary | ICD-10-CM

## 2017-01-23 DIAGNOSIS — R260 Ataxic gait: Secondary | ICD-10-CM
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
## 2017-01-27 ENCOUNTER — Ambulatory Visit: Payer: Medicare Other | Admitting: Physical Therapy

## 2017-01-27 DIAGNOSIS — R260 Ataxic gait: Secondary | ICD-10-CM

## 2017-01-27 DIAGNOSIS — R2681 Unsteadiness on feet: Secondary | ICD-10-CM | POA: Diagnosis not present

## 2017-01-27 DIAGNOSIS — M21372 Foot drop, left foot: Secondary | ICD-10-CM

## 2017-01-27 DIAGNOSIS — M6281 Muscle weakness (generalized): Secondary | ICD-10-CM

## 2017-01-27 NOTE — Therapy (Signed)
[facility] [address]  [address] [place], [place], [address] Phone: [phone]   Fax:  [phone]  Physical Therapy Treatment  Patient Details  Name: [name] Trujillo MRN: [id number] Date of Birth: 05-May-1941 Referring Provider: Naomie DeanAntonia Ahern, MD  Encounter Date: 01/27/2017      PT End of Session - 01/27/17 1531    Visit Number 9   Number of Visits 16   Date for PT Re-Evaluation 02/07/17   Authorization Type UHC Medicare [place]: [place]   PT Start Time 1531   PT Stop Time 1616   PT Time Calculation (min) 45 min   Activity Tolerance Patient tolerated treatment well   Behavior During Therapy [facility] for tasks assessed/performed      Past Medical History:  Diagnosis Date  . High cholesterol   . Hypothyroidism   . Neuropathy [facility](HCC)     Past Surgical History:  Procedure Laterality Date  . APPENDECTOMY     7717-20 yo  . BACK SURGERY     Age 76  . CESAREAN SECTION    . ECTOPIC PREGNANCY SURGERY    . LAPAROSCOPIC HYSTERECTOMY      There were no vitals filed for this visit.      Subjective Assessment - 01/27/17 1535    Subjective Pt noting difficulty with toe release during towel scrunches.   Pertinent History Remote h/o surgery on L sciatic nerve; h/o lumbar HNP; pes planus   Patient Stated Goals "to get my balance back so I can feel safer in the yard."   Currently in Pain? No/denies                         [facility] PT Treatment/Exercise - 01/27/17 1531      Knee/Hip Exercises: Aerobic   Recumbent Bike lvl 2 x 6'     Knee/Hip Exercises: Standing   Forward Step Up Both;15 reps;Step Height: 6";Hand Hold: 2   Functional Squat 15 reps;3 seconds   Functional Squat Limitations counter squat   Other Standing Knee Exercises Alt toe clears to 8" step, standing on Airex pad x15     Ankle Exercises: Seated   Ankle Circles/Pumps Left;15 reps  DF & inversion - improving AROM              Balance  Exercises - 01/27/17 1531      Balance Exercises: Standing   Tandem Stance 30 secs;3 reps;Intermittent upper extremity support   Rebounder Double leg;Tandem;15 reps  lat & ant/post wt shift, marching in place           PT Education - 01/27/17 1618    Education provided Yes   Education Details HEP update - counter squats & toe taps   Person(s) Educated Patient   Methods Explanation;Demonstration;Handout   Comprehension Verbalized understanding;Returned demonstration;Need further instruction          PT Short Term Goals - 01/23/17 1648      PT SHORT TERM GOAL #1   Title Independent with initial HEP by 01/24/17   Status Achieved     PT SHORT TERM GOAL #2   Title Berg >/= 42 to reduce risk for falls by 01/24/17   Status Achieved           PT Long Term Goals - 12/04/16 1109      PT LONG TERM GOAL #1   Title Independent with advanced HEP by 02/07/17   Status On-going  PT LONG TERM GOAL #2   Title B LE strength >/= 4/5 for improved gait stability by 02/07/17   Status On-going     PT LONG TERM GOAL #3   Title Berg >/= 48/56 to reduce risk for falls by 02/07/17   Status On-going     PT LONG TERM GOAL #4   Title DGI >/= 20/24 to improve gait safety and stability by 02/07/17   Status On-going     PT LONG TERM GOAL #5   Title Pt will ambulate with normal gait pattern w/o evidence of L foot drop/slap by 02/07/17   Status On-going               Plan - 01/27/17 1538    Clinical Impression Statement Pt continues to demonstrate good progress with improving L ankle AROM in DF & inversion but still unable to tolerate resistance in these motions. Pt noting increasing confidence with working in yard but still feels limited with balance. Pt nearing end of current POC, but given continued progress and further potential for improvement, anticipate pt will benefit for recert on 02/06/17 visit.   Rehab Potential Good   Clinical Impairments Affecting Rehab Potential h/o scoliosis,  DDD, neuropathy, remote h/o back surgery    PT Treatment/Interventions Patient/family education;Neuromuscular re-education;Balance training;Therapeutic exercise;Therapeutic activities;Functional mobility training;Gait training;Stair training;Manual techniques;Taping;Dry needling;Electrical Stimulation;ADLs/Self Care Home Management   PT Next Visit Plan 10th visit FOTO & G-code; Core & LE strengthening with emphasis on hip and ankles; L foot intrinsic muscle re-education/strengthening; Proprioceptive training   Consulted and Agree with Plan of Care Patient      Patient will benefit from skilled therapeutic intervention in order to improve the following deficits and impairments:  Decreased strength, Decreased balance, Decreased coordination, Impaired sensation, Abnormal gait, Difficulty walking, Decreased activity tolerance, Postural dysfunction  Visit Diagnosis: Unsteadiness on feet  Ataxic gait  Foot drop, left  Muscle weakness (generalized)     Problem List Patient Active Problem List   Diagnosis Date Noted  . Ventricular premature beats 10/31/2015  . Heart murmur 10/31/2015    Marry Guan, PT, MPT 01/27/2017, 4:25 PM  [facility] [address]  [address] [place], [place], [address] Phone: [phone]   Fax:  [phone]  Name: [name] Trujillo MRN: [id number] Date of Birth: 23-Jun-1941

## 2017-01-30 ENCOUNTER — Ambulatory Visit: Payer: Medicare Other | Admitting: Rehabilitation

## 2017-01-30 ENCOUNTER — Encounter: Payer: Self-pay | Admitting: Rehabilitation

## 2017-01-30 DIAGNOSIS — M6281 Muscle weakness (generalized): Secondary | ICD-10-CM

## 2017-01-30 DIAGNOSIS — R2681 Unsteadiness on feet: Secondary | ICD-10-CM

## 2017-01-30 DIAGNOSIS — R260 Ataxic gait: Secondary | ICD-10-CM

## 2017-01-30 NOTE — Therapy (Signed)
[facility] [address]  [address] [place], [place], [address] Phone: [phone]   Fax:  [phone]  Physical Therapy Treatment  Patient Details  Name: [name] Trujillo MRN: [id number] Date of Birth: 04/16/1941 Referring Provider: Naomie DeanAntonia Ahern, MD  Encounter Date: 01/30/2017      PT End of Session - 01/30/17 1616    Visit Number 10   Number of Visits 17   Date for PT Re-Evaluation 02/07/17   Authorization Type UHC Medicare [place]: [place]   PT Start Time 1533   PT Stop Time 1614   PT Time Calculation (min) 41 min   Activity Tolerance Patient tolerated treatment well      Past Medical History:  Diagnosis Date  . High cholesterol   . Hypothyroidism   . Neuropathy     Past Surgical History:  Procedure Laterality Date  . APPENDECTOMY     817-18 yo  . BACK SURGERY     Age 76  . CESAREAN SECTION    . ECTOPIC PREGNANCY SURGERY    . LAPAROSCOPIC HYSTERECTOMY      There were no vitals filed for this visit.      Subjective Assessment - 01/30/17 1530    Subjective FOTO score worsening from 53% to 66% but pt reports that she is not any worse.     Currently in Pain? No/denies                         [facility]/Exercise - 01/30/17 0001      Knee/Hip Exercises: Aerobic   Recumbent Bike lvl 2 x 6'     Ankle Exercises: Stretches   Other Stretch rocker 2x30" bil     Ankle Exercises: Seated   Other Seated Ankle Exercises AROM into inversion and Df x 10 each             Balance Exercises - 01/30/17 1613      Balance Exercises: Standing   Standing Eyes Closed Narrow base of support (BOS);Solid surface;Foam/compliant surface;4 reps;10 secs  CGA   Step Ups Forward;6 inch;UE support 2  Also Sl tap ups onto step without support x 10bil   Other Standing Exercises Rebounder heel to toe x 10, march x 10, tandem stance bil 2x30" all CGA             PT Short Term Goals - 01/23/17  1648      PT SHORT TERM GOAL #1   Title Independent with initial HEP by 01/24/17   Status Achieved     PT SHORT TERM GOAL #2   Title Berg >/= 42 to reduce risk for falls by 01/24/17   Status Achieved           PT Long Term Goals - 01/30/17 1710      PT LONG TERM GOAL #1   Title Independent with advanced HEP by 02/07/17   Status On-going     PT LONG TERM GOAL #2   Title B LE strength >/= 4/5 for improved gait stability by 02/07/17   Status On-going     PT LONG TERM GOAL #3   Title Berg >/= 48/56 to reduce risk for falls by 02/07/17   Status On-going     PT LONG TERM GOAL #4   Title DGI >/= 20/24 to improve gait safety and stability by 02/07/17   Status On-going     PT LONG TERM GOAL #5  Title Pt will ambulate with normal gait pattern w/o evidence of L foot drop/slap by 02/07/17   Status On-going               Plan - 02/08/2017 1708    Clinical Impression Statement Pt demonstrates continued uncertainty about the reasons for her gait and balance troubles and wanted to discuss this during treatment.  Reviewed with patient diagnostic findings and potenital causes.  Treatment tolerated well with most LOB occuring with eyes closed and with weight shift posteriorly.     Clinical Impairments Affecting Rehab Potential h/o scoliosis, DDD, neuropathy, remote h/o back surgery    PT Treatment/Interventions Patient/family education;Neuromuscular re-education;Balance training;Therapeutic exercise;Therapeutic activities;Functional mobility training;Gait training;Stair training;Manual techniques;Taping;Dry needling;Electrical Stimulation;ADLs/Self Care Home Management   PT Next Visit Plan Core & LE strengthening with emphasis on hip and ankles; L foot intrinsic muscle re-education/strengthening; Proprioceptive training   Consulted and Agree with Plan of Care Patient      Patient will benefit from skilled therapeutic intervention in order to improve the following deficits and impairments:   Decreased strength, Decreased balance, Decreased coordination, Impaired sensation, Abnormal gait, Difficulty walking, Decreased activity tolerance, Postural dysfunction  Visit Diagnosis: Muscle weakness (generalized)  Ataxic gait  Unsteadiness on feet       G-Codes - 2017/02/08 1625    Functional Assessment Tool Used (Outpatient Only) FOTO; 66%   Functional Limitation Mobility: Walking and moving around   Mobility: Walking and Moving Around Current Status ([id number]) At least 60 percent but less than 80 percent impaired, limited or restricted   Mobility: Walking and Moving Around Goal Status [phone]) At least 40 percent but less than 60 percent impaired, limited or restricted      Problem List Patient Active Problem List   Diagnosis Date Noted  . Ventricular premature beats 10/31/2015  . Heart murmur 10/31/2015   . Idamae Lusher Feb 08, 2017, 5:11 PM  [facility] [address]  [address] [place], [place], [address] Phone: [phone]   Fax:  [phone]  Name: [name] Trujillo MRN: [id number] Date of Birth: 08-26-1941

## 2017-02-04 ENCOUNTER — Encounter: Payer: Self-pay | Admitting: Rehabilitation

## 2017-02-04 ENCOUNTER — Ambulatory Visit: Payer: Medicare Other | Admitting: Rehabilitation

## 2017-02-04 DIAGNOSIS — R2681 Unsteadiness on feet: Secondary | ICD-10-CM

## 2017-02-04 DIAGNOSIS — R260 Ataxic gait: Secondary | ICD-10-CM

## 2017-02-04 DIAGNOSIS — M6281 Muscle weakness (generalized): Secondary | ICD-10-CM

## 2017-02-04 DIAGNOSIS — M21372 Foot drop, left foot: Secondary | ICD-10-CM

## 2017-02-04 NOTE — Therapy (Signed)
[facility] [address]  [address] [place], [place], [address] Phone: [phone]   Fax:  [phone]  Physical Therapy Treatment  Patient Details  Name: Biagio QuintCaryol Senft MRN: [id number] Date of Birth: 1941/04/14 Referring Provider: Naomie DeanAntonia Ahern, MD  Encounter Date: 02/04/2017      PT End of Session - 02/04/17 1442    Visit Number 11   Number of Visits 17   Date for PT Re-Evaluation 02/07/17   PT Start Time 1402   PT Stop Time 1442   PT Time Calculation (min) 40 min   Activity Tolerance Patient tolerated treatment well      Past Medical History:  Diagnosis Date  . High cholesterol   . Hypothyroidism   . Neuropathy     Past Surgical History:  Procedure Laterality Date  . APPENDECTOMY     1317-20 yo  . BACK SURGERY     Age 76  . CESAREAN SECTION    . ECTOPIC PREGNANCY SURGERY    . LAPAROSCOPIC HYSTERECTOMY      There were no vitals filed for this visit.      Subjective Assessment - 02/04/17 1359    Subjective "I feel like the L foot is inverting"   Currently in Pain? No/denies                         [facility] Treatment/Exercise - 02/04/17 0001      Knee/Hip Exercises: Aerobic   Recumbent Bike lvl 2 x 6'     Knee/Hip Exercises: Standing   Forward Step Up Step Height: 8";10 reps  CGA and 2 poles   Other Standing Knee Exercises standing tandem forward and backward steps  CGA/min LOB x 3     Knee/Hip Exercises: Supine   Other Supine Knee/Hip Exercises hooklying clams green band x20bil and L only x 15     Ankle Exercises: Seated   Other Seated Ankle Exercises AROM DF/Inv x 10 L focus; yellow band resisted 4way ankle x 10 each bil AAROM for L ankle DF and inversion             Balance Exercises - 02/04/17 1433      Balance Exercises: Standing   Rebounder --  heel to toe rock CGA x 15, march x 15, EC stance 3x15"             PT Short Term Goals - 01/23/17 1648      PT SHORT TERM GOAL #1   Title Independent with initial HEP by 01/24/17   Status Achieved     PT SHORT TERM GOAL #2   Title Berg >/= 42 to reduce risk for falls by 01/24/17   Status Achieved           PT Long Term Goals - 01/30/17 1710      PT LONG TERM GOAL #1   Title Independent with advanced HEP by 02/07/17   Status On-going     PT LONG TERM GOAL #2   Title B LE strength >/= 4/5 for improved gait stability by 02/07/17   Status On-going     PT LONG TERM GOAL #3   Title Berg >/= 48/56 to reduce risk for falls by 02/07/17   Status On-going     PT LONG TERM GOAL #4   Title DGI >/= 20/24 to improve gait safety and stability by 02/07/17   Status On-going     PT  LONG TERM GOAL #5   Title Pt will ambulate with normal gait pattern w/o evidence of L foot drop/slap by 02/07/17   Status On-going               Plan - 02/04/17 1443    Clinical Impression Statement reports subjective improvements in L ankle AROM upon arriving today.  Was able to perform resisted 4way ankle with AAROM for L ankle DF and inversion.  LOB continues posteriorly with weight shifting and balance challenges.  Included some hip abduction strenthening today due to trendelenburg with marching on trampoline   PT Next Visit Plan Core & LE strengthening with emphasis on hip and ankles; L foot intrinsic muscle re-education/strengthening; Proprioceptive training      Patient will benefit from skilled therapeutic intervention in order to improve the following deficits and impairments:     Visit Diagnosis: Ataxic gait  Muscle weakness (generalized)  Unsteadiness on feet  Foot drop, left     Problem List Patient Active Problem List   Diagnosis Date Noted  . Ventricular premature beats 10/31/2015  . Heart murmur 10/31/2015    Idamae Lusher, DPT, CMP 02/04/2017, 2:45 PM  [facility] [address]  [address] [place], [place], [address] Phone:  [phone]   Fax:  [phone]  Name: Flossie Wexler MRN: [id number] Date of Birth: 10-Sep-1940

## 2017-02-06 ENCOUNTER — Ambulatory Visit: Payer: Medicare Other | Admitting: Physical Therapy

## 2017-02-06 DIAGNOSIS — R2681 Unsteadiness on feet: Secondary | ICD-10-CM

## 2017-02-06 DIAGNOSIS — M6281 Muscle weakness (generalized): Secondary | ICD-10-CM

## 2017-02-06 DIAGNOSIS — M21372 Foot drop, left foot: Secondary | ICD-10-CM

## 2017-02-06 DIAGNOSIS — R260 Ataxic gait: Secondary | ICD-10-CM

## 2017-02-06 NOTE — Therapy (Signed)
[facility] [address]  [place], [place], [address] Phone: [phone]   Fax:  [phone]  Physical Therapy Treatment  Patient Details  Name: [name] Trujillo MRN: [id number] Date of Birth: [birth year] Referring Provider: Sarina Ill, MD & Mechele Claude, PA-C  Encounter Date: 02/06/2017      PT End of Session - 02/06/17 0936    Visit Number 12   Number of Visits 20   Date for PT Re-Evaluation 03/14/17   Authorization Type UHC Medicare [place]: [place]   PT Start Time [phone]  pt arrived late   PT Stop Time 1015   PT Time Calculation (min) 39 min   Activity Tolerance Patient tolerated treatment well   Behavior During Therapy [facility] for tasks assessed/performed      Past Medical History:  Diagnosis Date  . High cholesterol   . Hypothyroidism   . Neuropathy     Past Surgical History:  Procedure Laterality Date  . APPENDECTOMY     3-18 yo  . BACK SURGERY     Age 89  . CESAREAN SECTION    . ECTOPIC PREGNANCY SURGERY    . LAPAROSCOPIC HYSTERECTOMY      There were no vitals filed for this visit.      Subjective Assessment - 02/06/17 0939    Subjective Pt noting increased soreness at hip after riding the bike at level 3 resistance, but able to resolve pain with ibuprofen.   Patient Stated Goals "to get my balance back so I can feel safer in the yard."   Currently in Pain? No/denies   Pain Score --  0.5/10            [facility] PT Assessment - 02/06/17 0936      Assessment   Medical Diagnosis Abnormality of gait; L foot drop; Peripheral neuropathy   Referring Provider Sarina Ill, MD & Mechele Claude, PA-C   Next MD Visit none scheduled     Prior Function   Level of Independence Independent   Vocation Retired   Leisure quilting, had done [employer] prior to herniated disk     AROM   Right Ankle Dorsiflexion 7   Right Ankle Plantar Flexion 62   Right Ankle Inversion 26   Right Ankle Eversion 22   Left Ankle Dorsiflexion 3   Left Ankle Plantar Flexion 60   Left Ankle Inversion 14   Left Ankle Eversion 23     Strength   Right Hip Flexion 4-/5   Right Hip Extension 4-/5   Right Hip External Rotation  4-/5   Right Hip Internal Rotation 4-/5   Right Hip ABduction 4/5   Right Hip ADduction 4-/5   Left Hip Flexion 4-/5   Left Hip Extension 4-/5   Left Hip External Rotation 4-/5   Left Hip Internal Rotation 4-/5   Left Hip ABduction 4/5   Left Hip ADduction 4-/5   Right Knee Flexion 4+/5   Right Knee Extension 4+/5   Left Knee Flexion 4+/5   Left Knee Extension 4+/5   Right Ankle Dorsiflexion 4-/5   Right Ankle Plantar Flexion 4-/5   Right Ankle Inversion 4/5   Right Ankle Eversion 4/5   Left Ankle Dorsiflexion 3-/5   Left Ankle Plantar Flexion 3+/5   Left Ankle Inversion 3-/5   Left Ankle Eversion 4-/5     Ambulation/Gait   Gait velocity 3.44 ft/sec     Standardized Balance Assessment   Standardized Balance Assessment  10 meter walk test;Dynamic Gait Index   10 Meter Walk 9.53     Dynamic Gait Index   Level Surface Mild Impairment   Change in Gait Speed Mild Impairment   Gait with Horizontal Head Turns Mild Impairment   Gait with Vertical Head Turns Mild Impairment   Gait and Pivot Turn Mild Impairment   Step Over Obstacle Mild Impairment   Step Around Obstacles Normal   Steps Mild Impairment   Total Score 17     Timed Up and Go Test   TUG Normal TUG   Normal TUG (seconds) 8.53                     [facility] Adult PT Treatment/Exercise - 02/06/17 0936      Knee/Hip Exercises: Aerobic   Recumbent Bike lvl 2 x 6'                  PT Short Term Goals - 01/23/17 1648      PT SHORT TERM GOAL #1   Title Independent with initial HEP by 01/24/17   Status Achieved     PT SHORT TERM GOAL #2   Title Berg >/= 42 to reduce risk for falls by 01/24/17   Status Achieved           PT Long Term Goals - 02/06/17 0942      PT LONG TERM GOAL #1    Title Independent with advanced HEP by 03/14/17   Status Partially Met  met for current HEP     PT LONG TERM GOAL #2   Title B LE strength >/= 4/5 for improved gait stability by 03/14/17   Status Partially Met     PT LONG TERM GOAL #3   Title Berg >/= 48/56 to reduce risk for falls by 03/14/17   Status On-going     PT LONG TERM GOAL #4   Title DGI >/= 20/24 to improve gait safety and stability by 03/14/17   Status On-going     PT LONG TERM GOAL #5   Title Pt will ambulate with normal gait pattern w/o evidence of L foot drop/slap by 03/14/17   Status On-going               Plan - 02/06/17 0942    Clinical Impression Statement Pt pleased with progress thus far with PT, noting improving L ankle AROM and decreasing incidence of foot drop/slap while ambulating. Overall LE strength improved by at least 1/2 grade, with greatest weakness still evident in B hips and L ankle. Pt reporting anecdotal impression of improving balance which is evidenced by improvements in the Berg from 35/56 to 47/56 and DGI from 14/24 to 17/24. TUG time and gait speed also improving. LTG's only partially met at this time and pt demonstrating good potential for further improvement, therefore will plan to recert for additional 8 visits to continue focus on restoring functional ankle AROM, strengthening and balance/dynamic gait training.   Rehab Potential Good   Clinical Impairments Affecting Rehab Potential h/o scoliosis, DDD, neuropathy, remote h/o back surgery    PT Frequency 2x / week   PT Duration 4 weeks   PT Treatment/Interventions Patient/family education;Neuromuscular re-education;Balance training;Therapeutic exercise;Therapeutic activities;Functional mobility training;Gait training;Stair training;Manual techniques;Taping;Dry needling;Electrical Stimulation;ADLs/Self Care Home Management   PT Next Visit Plan Core & LE strengthening with emphasis on hip and ankles; L foot intrinsic muscle  re-education/strengthening; Proprioceptive training   Consulted and Agree with Plan of Care Patient  Patient will benefit from skilled therapeutic intervention in order to improve the following deficits and impairments:  Decreased strength, Decreased balance, Decreased coordination, Impaired sensation, Abnormal gait, Difficulty walking, Decreased activity tolerance, Postural dysfunction  Visit Diagnosis: Unsteadiness on feet  Ataxic gait  Foot drop, left  Muscle weakness (generalized)     Problem List Patient Active Problem List   Diagnosis Date Noted  . Ventricular premature beats 10/31/2015  . Heart murmur 10/31/2015    Percival Spanish, PT, MPT 02/06/2017, 12:39 PM  [facility] [address]  [place], [place], [address] Phone: [phone]   Fax:  [phone]  Name: [name] Trujillo MRN: [id number] Date of Birth: 31-Dec-1940

## 2017-02-10 ENCOUNTER — Ambulatory Visit: Payer: Medicare Other | Attending: Neurology

## 2017-02-10 DIAGNOSIS — M6281 Muscle weakness (generalized): Secondary | ICD-10-CM | POA: Insufficient documentation

## 2017-02-10 DIAGNOSIS — R2681 Unsteadiness on feet: Secondary | ICD-10-CM | POA: Diagnosis not present

## 2017-02-10 DIAGNOSIS — R260 Ataxic gait: Secondary | ICD-10-CM | POA: Diagnosis present

## 2017-02-10 DIAGNOSIS — M21372 Foot drop, left foot: Secondary | ICD-10-CM

## 2017-02-10 NOTE — Therapy (Signed)
[facility] [address]  [place], [place], [address] Phone: [phone]   Fax:  [phone]  Physical Therapy Treatment  Patient Details  Name: [name] Trujillo MRN: [id number] Date of Birth: 1940-11-17 Referring Provider: Sarina Ill, MD & Mechele Claude, PA-C  Encounter Date: 02/10/2017      PT End of Session - 02/10/17 1454    Visit Number 13   Number of Visits 20   Date for PT Re-Evaluation 03/14/17   Authorization Type UHC Medicare [place]: [place]   PT Start Time 1448   PT Stop Time 1529   PT Time Calculation (min) 41 min   Activity Tolerance Patient tolerated treatment well   Behavior During Therapy [facility] for tasks assessed/performed      Past Medical History:  Diagnosis Date  . High cholesterol   . Hypothyroidism   . Neuropathy     Past Surgical History:  Procedure Laterality Date  . APPENDECTOMY     83-20 yo  . BACK SURGERY     Age 4  . CESAREAN SECTION    . ECTOPIC PREGNANCY SURGERY    . LAPAROSCOPIC HYSTERECTOMY      There were no vitals filed for this visit.      Subjective Assessment - 02/10/17 1453    Subjective Pt. doing well today.    Patient Stated Goals "to get my balance back so I can feel safer in the yard."   Currently in Pain? No/denies   Pain Score 0-No pain   Multiple Pain Sites No                         [facility] Adult PT Treatment/Exercise - 02/10/17 1452      Neuro Re-ed    Neuro Re-ed Details  Gait with diagonal head turns in hallway 2 x 200 ft;      Knee/Hip Exercises: Aerobic   Recumbent Bike lvl 3 x 6'     Knee/Hip Exercises: Standing   Forward Lunges Right;Left;1 set;20 reps   Forward Lunges Limitations lunging onto BOSU ball (up) x 20 reps each side; 2 ski pole support    Other Standing Knee Exercises Standing alternating step onto 9" standing on blue airex pad with cross-over reach to cone on bolster 2 x 15 reps     Knee/Hip Exercises: Supine   Bridges with Clamshell Both;10 reps;1 set  with sustained hip abd/ER with green TB; cramping x 3    Other Supine Knee/Hip Exercises hooklying clams green band x 15 reps each side     Knee/Hip Exercises: Sidelying   Clams B clam shell with red TB x 10 reps                  PT Short Term Goals - 01/23/17 1648      PT SHORT TERM GOAL #1   Title Independent with initial HEP by 01/24/17   Status Achieved     PT SHORT TERM GOAL #2   Title Berg >/= 42 to reduce risk for falls by 01/24/17   Status Achieved           PT Long Term Goals - 02/06/17 0942      PT LONG TERM GOAL #1   Title Independent with advanced HEP by 03/14/17   Status Partially Met  met for current HEP     PT LONG TERM GOAL #2   Title B LE strength >/= 4/5  for improved gait stability by 03/14/17   Status Partially Met     PT LONG TERM GOAL #3   Title Berg >/= 48/56 to reduce risk for falls by 03/14/17   Status On-going     PT LONG TERM GOAL #4   Title DGI >/= 20/24 to improve gait safety and stability by 03/14/17   Status On-going     PT LONG TERM GOAL #5   Title Pt will ambulate with normal gait pattern w/o evidence of L foot drop/slap by 03/14/17   Status On-going               Plan - 02/10/17 1456    Clinical Impression Statement Pt. doing well today.  Treatment focused on proximal hip strengthening and standing balance on compliant surfaces with stepping.  Pt. tolerated all well however with difficulty reaching beyond BOS.  Gait with head turns today with pt. deviating >12" outside of straight path and demonstrating difficulty maintaining balance.  Pt. would benefit from further skilled therapy to focus on remaining balance and strength deficits.   PT Treatment/Interventions Patient/family education;Neuromuscular re-education;Balance training;Therapeutic exercise;Therapeutic activities;Functional mobility training;Gait training;Stair training;Manual techniques;Taping;Dry needling;Electrical  Stimulation;ADLs/Self Care Home Management   PT Next Visit Plan updated HEP prn; Core & LE strengthening with emphasis on hip and ankles; L foot intrinsic muscle re-education/strengthening; Proprioceptive training      Patient will benefit from skilled therapeutic intervention in order to improve the following deficits and impairments:  Decreased strength, Decreased balance, Decreased coordination, Impaired sensation, Abnormal gait, Difficulty walking, Decreased activity tolerance, Postural dysfunction  Visit Diagnosis: Unsteadiness on feet  Ataxic gait  Foot drop, left  Muscle weakness (generalized)     Problem List Patient Active Problem List   Diagnosis Date Noted  . Ventricular premature beats 10/31/2015  . Heart murmur 10/31/2015    Bess Harvest, PTA 02/10/17 6:21 PM  [facility] [address]  [place], [place], [address] Phone: [phone]   Fax:  [phone]  Name: Adylynn Hertenstein MRN: [id number] Date of Birth: 1941/03/30

## 2017-02-17 ENCOUNTER — Encounter: Payer: Medicare Other | Admitting: Physical Therapy

## 2017-02-18 ENCOUNTER — Ambulatory Visit: Payer: Medicare Other | Admitting: Physical Therapy

## 2017-02-18 DIAGNOSIS — R2681 Unsteadiness on feet: Secondary | ICD-10-CM | POA: Diagnosis not present

## 2017-02-18 DIAGNOSIS — R260 Ataxic gait: Secondary | ICD-10-CM

## 2017-02-18 DIAGNOSIS — M21372 Foot drop, left foot: Secondary | ICD-10-CM

## 2017-02-18 DIAGNOSIS — M6281 Muscle weakness (generalized): Secondary | ICD-10-CM

## 2017-02-18 NOTE — Therapy (Addendum)
[facility] [address]  [place], [place], [address] Phone: [phone]   Fax:  [phone]  Physical Therapy Treatment  Patient Details  Name: [name] Trujillo MRN: [id number] Date of Birth: 12-13-1940 Referring Provider: Sarina Ill, MD & Mechele Claude, PA-C  Encounter Date: 02/18/2017      PT End of Session - 02/18/17 1358    Visit Number 14   Number of Visits 20   Date for PT Re-Evaluation 03/14/17   Authorization Type UHC Medicare [place]   PT Start Time 1916   PT Stop Time 1442   PT Time Calculation (min) 44 min   Activity Tolerance Patient tolerated treatment well   Behavior During Therapy [facility] for tasks assessed/performed      Past Medical History:  Diagnosis Date  . High cholesterol   . Hypothyroidism   . Neuropathy     Past Surgical History:  Procedure Laterality Date  . APPENDECTOMY     1-18 yo  . BACK SURGERY     Age 76  . CESAREAN SECTION    . ECTOPIC PREGNANCY SURGERY    . LAPAROSCOPIC HYSTERECTOMY      There were no vitals filed for this visit.      Subjective Assessment - 02/18/17 1401    Subjective Pt reporting less opportunity to complete exercises while traveling and noting more stiffness today.   Patient Stated Goals "to get my balance back so I can feel safer in the yard."   Currently in Pain? No/denies                         [facility] Adult PT Treatment/Exercise - 02/18/17 1358      Knee/Hip Exercises: Stretches   ITB Stretch Right;30 seconds;2 reps   ITB Stretch Limitations standing at wall   Other Knee/Hip Stretches Roller stick to lateral quads & ITB in sitting x1'     Knee/Hip Exercises: Aerobic   Recumbent Bike lvl 3 x 5'     Knee/Hip Exercises: Standing   Other Standing Knee Exercises Alt toe clears to 8" step, standing on Airex pad x20     Manual Therapy   Manual Therapy Soft tissue mobilization   Manual therapy comments pt in L sidelying  with R lower leg on bolster   Soft tissue mobilization STM & IASTM with roller stick to lateral quads & ITB             Balance Exercises - 02/18/17 1358      Balance Exercises: Standing   Standing Eyes Opened Foam/compliant surface;30 secs;5 reps  ball toss encouraging lateral & fwd step    SLS with Vectors Foam/compliant surface;Upper extremity assist 1;5 reps  2 sets, 1 pole A   Sidestepping Foam/compliant support;4 reps;Upper extremity support  foam balance beam   Other Standing Exercises Walking on red floor mat with pebbles btw layers - perimeter walk, diagonals & figure 8           PT Education - 02/18/17 1444    Education provided Yes   Education Details ITB rolling and standing stretch   Person(s) Educated Patient   Methods Explanation;Demonstration;Handout   Comprehension Verbalized understanding;Returned demonstration          PT Short Term Goals - 01/23/17 1648      PT SHORT TERM GOAL #1   Title Independent with initial HEP by 01/24/17   Status Achieved  PT SHORT TERM GOAL #2   Title Berg >/= 42 to reduce risk for falls by 01/24/17   Status Achieved           PT Long Term Goals - 02/06/17 0942      PT LONG TERM GOAL #1   Title Independent with advanced HEP by 03/14/17   Status Partially Met  met for current HEP     PT LONG TERM GOAL #2   Title B LE strength >/= 4/5 for improved gait stability by 03/14/17   Status Partially Met     PT LONG TERM GOAL #3   Title Berg >/= 48/56 to reduce risk for falls by 03/14/17   Status On-going     PT LONG TERM GOAL #4   Title DGI >/= 20/24 to improve gait safety and stability by 03/14/17   Status On-going     PT LONG TERM GOAL #5   Title Pt will ambulate with normal gait pattern w/o evidence of L foot drop/slap by 03/14/17   Status On-going               Plan - 02/18/17 1404    Clinical Impression Statement Pt continuing to note lateral R hip pain with attempts at 4 way SLR with HEP. Assessment  reveals ttp over R greater trochanter along with increased tension in R vastus lateralis and ITB. STM performed to this area and pt instructed pt in additional stretches for ITB as well as rolling techniques with pt noting immediate imporvement, therefore added to HEP. Remainder of treatment session focused on stability with static and dynamic activities on uneven surfaces with pt requiring intermittent UE support.   Rehab Potential Good   Clinical Impairments Affecting Rehab Potential h/o scoliosis, DDD, neuropathy, remote h/o back surgery    PT Treatment/Interventions Patient/family education;Neuromuscular re-education;Balance training;Therapeutic exercise;Therapeutic activities;Functional mobility training;Gait training;Stair training;Manual techniques;Taping;Dry needling;Electrical Stimulation;ADLs/Self Care Home Management   PT Next Visit Plan ionto patch to R greater trochanter if approved by MD; updated HEP prn; Core & LE strengthening with emphasis on hip and ankles; L foot intrinsic muscle re-education/strengthening; Proprioceptive training   Consulted and Agree with Plan of Care Patient      Patient will benefit from skilled therapeutic intervention in order to improve the following deficits and impairments:  Decreased strength, Decreased balance, Decreased coordination, Impaired sensation, Abnormal gait, Difficulty walking, Decreased activity tolerance, Postural dysfunction  Visit Diagnosis: Unsteadiness on feet  Ataxic gait  Foot drop, left  Muscle weakness (generalized)     Problem List Patient Active Problem List   Diagnosis Date Noted  . Ventricular premature beats 10/31/2015  . Heart murmur 10/31/2015    Percival Spanish, PT, MPT 02/19/2017, 1:17 PM  [facility] [address]  [place], [place], [address] Phone: [phone]   Fax:  [phone]  Name: [name] Trujillo MRN: [id number] Date of Birth:  [birth year]

## 2017-02-20 ENCOUNTER — Telehealth: Payer: Self-pay | Admitting: Neurology

## 2017-02-20 ENCOUNTER — Ambulatory Visit: Payer: Medicare Other | Admitting: Physical Therapy

## 2017-02-20 DIAGNOSIS — R2681 Unsteadiness on feet: Secondary | ICD-10-CM | POA: Diagnosis not present

## 2017-02-20 DIAGNOSIS — R260 Ataxic gait: Secondary | ICD-10-CM

## 2017-02-20 DIAGNOSIS — M6281 Muscle weakness (generalized): Secondary | ICD-10-CM

## 2017-02-20 DIAGNOSIS — M21372 Foot drop, left foot: Secondary | ICD-10-CM

## 2017-02-20 NOTE — Therapy (Signed)
[facility] [address]  [place], [place], [address] Phone: [phone]   Fax:  [phone]  Physical Therapy Treatment  Patient Details  Name: [name] Trujillo MRN: [id number] Date of Birth: February 17, 1941 Referring Provider: Sarina Ill, MD & Mechele Claude, PA-C  Encounter Date: 02/20/2017      PT End of Session - 02/20/17 1358    Visit Number 14   Number of Visits 20   Date for PT Re-Evaluation 03/14/17   Authorization Type UHC Medicare [place]: [place]   PT Start Time 3557   PT Stop Time 1445   PT Time Calculation (min) 47 min   Activity Tolerance Patient tolerated treatment well   Behavior During Therapy [facility] for tasks assessed/performed      Past Medical History:  Diagnosis Date  . High cholesterol   . Hypothyroidism   . Neuropathy     Past Surgical History:  Procedure Laterality Date  . APPENDECTOMY     23-20 yo  . BACK SURGERY     Age 10  . CESAREAN SECTION    . ECTOPIC PREGNANCY SURGERY    . LAPAROSCOPIC HYSTERECTOMY      There were no vitals filed for this visit.      Subjective Assessment - 02/20/17 1403    Subjective Pt reporting difficulty replicating the standing ITB stretch at home and requested PT to review. Pt reporting lessening occurence of anterior thigh pain that she used to have.   Pertinent History Remote h/o surgery on L sciatic nerve; h/o lumbar HNP; pes planus   Patient Stated Goals "to get my balance back so I can feel safer in the yard."   Currently in Pain? No/denies                         [facility] Adult PT Treatment/Exercise - 02/20/17 1358      Knee/Hip Exercises: Stretches   ITB Stretch Right;30 seconds;2 reps   ITB Stretch Limitations standing at wall     Knee/Hip Exercises: Aerobic   Recumbent Bike lvl 3 x 5'     Knee/Hip Exercises: Standing   Hip Flexion Both;10 reps;Knee straight;Stengthening   Hip Flexion Limitations ab set + yellow TB, UE support  on back of chair   Hip ADduction Both;10 reps;Strengthening   Hip ADduction Limitations ab set + yellow TB, UE support on back of chair   Hip Abduction Both;10 reps;Knee straight;Stengthening   Abduction Limitations ab set + yellow TB, UE support on back of chair   Hip Extension Both;10 reps;Knee straight;Stengthening   Extension Limitations ab set + yellow TB, UE support on back of chair   Lateral Step Up Both;10 reps;Step Height: 6";Hand Hold: 1   Lateral Step Up Limitations intermittent 1 pole A   Step Down Both;10 reps;Step Height: 4";Hand Hold: 2   Step Down Limitations lateral toe touch, 2 pole A     Ankle Exercises: Sidelying   Ankle Inversion Left;20 reps                  PT Short Term Goals - 01/23/17 1648      PT SHORT TERM GOAL #1   Title Independent with initial HEP by 01/24/17   Status Achieved     PT SHORT TERM GOAL #2   Title Berg >/= 42 to reduce risk for falls by 01/24/17   Status Achieved  PT Long Term Goals - 02/06/17 8295      PT LONG TERM GOAL #1   Title Independent with advanced HEP by 03/14/17   Status Partially Met  met for current HEP     PT LONG TERM GOAL #2   Title B LE strength >/= 4/5 for improved gait stability by 03/14/17   Status Partially Met     PT LONG TERM GOAL #3   Title Berg >/= 48/56 to reduce risk for falls by 03/14/17   Status On-going     PT LONG TERM GOAL #4   Title DGI >/= 20/24 to improve gait safety and stability by 03/14/17   Status On-going     PT LONG TERM GOAL #5   Title Pt will ambulate with normal gait pattern w/o evidence of L foot drop/slap by 03/14/17   Status On-going               Plan - 02/20/17 1408    Clinical Impression Statement Pt requiring clarification of standing ITB stretch and reports she has not yet attempted ITB rolling at home, but feels pain is lessening. Pt noting continued improvement in sense of stability but still demonstrates weakness throughout B LE's most pronounced  at hips and knees and trendelenbeurg gait pattern. Today's treatment focused on continued proximal strengthening, stability and balance, with pt reporting some mild pain/fatigue but otherwise tolerating exercises well. Pt continues to lack antigravity control of L ankle inversion, therefore will redirect focus of next treatment to include more distal LE strengthening & stability activities.   Rehab Potential Good   Clinical Impairments Affecting Rehab Potential h/o scoliosis, DDD, neuropathy, remote h/o back surgery    PT Treatment/Interventions Patient/family education;Neuromuscular re-education;Balance training;Therapeutic exercise;Therapeutic activities;Functional mobility training;Gait training;Stair training;Manual techniques;Taping;Dry needling;Electrical Stimulation;ADLs/Self Care Home Management   PT Next Visit Plan ionto patch to R greater trochanter if approved by MD; updated HEP prn; Core & LE strengthening with emphasis on hip and ankles; L foot intrinsic muscle re-education/strengthening; Proprioceptive training   Consulted and Agree with Plan of Care Patient      Patient will benefit from skilled therapeutic intervention in order to improve the following deficits and impairments:  Decreased strength, Decreased balance, Decreased coordination, Impaired sensation, Abnormal gait, Difficulty walking, Decreased activity tolerance, Postural dysfunction  Visit Diagnosis: Unsteadiness on feet  Ataxic gait  Foot drop, left  Muscle weakness (generalized)     Problem List Patient Active Problem List   Diagnosis Date Noted  . Ventricular premature beats 10/31/2015  . Heart murmur 10/31/2015    Percival Spanish, PT, MPT 02/20/2017, 7:24 PM  [facility] [address]  [place], [place], [address] Phone: [phone]   Fax:  [phone]  Name: Randilyn Foisy MRN: [id number] Date of Birth: 12/31/1940

## 2017-02-20 NOTE — Telephone Encounter (Signed)
[facility] need Dr. Trevor MaceAhern's approval for dexamethasone.

## 2017-02-20 NOTE — Telephone Encounter (Signed)
[name] Trujillo [phone] said she faxed referral yesterday to [phone] for the pt to ion Phoresor at appt today. Medical records has not rec'd it. She put it in EPIC as a referral. Please call

## 2017-02-21 NOTE — Telephone Encounter (Signed)
[name] Trujillo, it is fine for her to have this please let them know thanks

## 2017-02-24 ENCOUNTER — Ambulatory Visit: Payer: Medicare Other | Admitting: Physical Therapy

## 2017-02-24 DIAGNOSIS — M6281 Muscle weakness (generalized): Secondary | ICD-10-CM

## 2017-02-24 DIAGNOSIS — M21372 Foot drop, left foot: Secondary | ICD-10-CM

## 2017-02-24 DIAGNOSIS — R260 Ataxic gait: Secondary | ICD-10-CM

## 2017-02-24 DIAGNOSIS — R2681 Unsteadiness on feet: Secondary | ICD-10-CM

## 2017-02-24 NOTE — Therapy (Signed)
[facility] [address]  [place], [place], [address] Phone: [phone]   Fax:  [phone]  Physical Therapy Treatment  Patient Details  Name: Mariena Meares MRN: [id number] Date of Birth: November 17, 1940 Referring Provider: Sarina Ill, MD & Mechele Claude, PA-C  Encounter Date: 02/24/2017      PT End of Session - 02/24/17 1400    Visit Number 16   Number of Visits 20   Date for PT Re-Evaluation 03/14/17   Authorization Type UHC Medicare [place]: [place]   PT Start Time 1400   PT Stop Time 1445   PT Time Calculation (min) 45 min   Activity Tolerance Patient tolerated treatment well   Behavior During Therapy [facility] for tasks assessed/performed      Past Medical History:  Diagnosis Date  . High cholesterol   . Hypothyroidism   . Neuropathy     Past Surgical History:  Procedure Laterality Date  . APPENDECTOMY     65-18 yo  . BACK SURGERY     Age 72  . CESAREAN SECTION    . ECTOPIC PREGNANCY SURGERY    . LAPAROSCOPIC HYSTERECTOMY      There were no vitals filed for this visit.      Subjective Assessment - 02/24/17 1406    Subjective Pt reporting she was able to complete the 4 way standing hip SLR at home without pain.   Pertinent History Remote h/o surgery on L sciatic nerve; h/o lumbar HNP; pes planus   Patient Stated Goals "to get my balance back so I can feel safer in the yard."   Currently in Pain? No/denies                         [facility] Adult PT Treatment/Exercise - 02/24/17 1400      Knee/Hip Exercises: Aerobic   Recumbent Bike lvl 3 x 5'     Knee/Hip Exercises: Standing   Forward Step Up Both;10 reps;Hand Hold: 2;Step Height: 8"   Forward Step Up Limitations intermittent 2 pole A   Step Down Both;10 reps;Step Height: 4";Hand Hold: 2;2 sets   Step Down Limitations lateral & fwd eccentric lowering with heel touch each, 2 pole A   SLS B cone knock-over & righting x8     Ankle  Exercises: Standing   Heel Raises 5 reps;3 seconds  2 sets   Heel Raises Limitations 2nd set with L eccentric lowering   Toe Raise 10 reps;3 seconds   Toe Raise Limitations limited toe clearnce on L     Ankle Exercises: Seated   Other Seated Ankle Exercises L ankle - yellow band resisted 4 way ankle x15 each, AAROM for L ankle DF and inversion to acheive full ROM                  PT Short Term Goals - 01/23/17 1648      PT SHORT TERM GOAL #1   Title Independent with initial HEP by 01/24/17   Status Achieved     PT SHORT TERM GOAL #2   Title Berg >/= 42 to reduce risk for falls by 01/24/17   Status Achieved           PT Long Term Goals - 02/06/17 0942      PT LONG TERM GOAL #1   Title Independent with advanced HEP by 03/14/17   Status Partially Met  met for current HEP  PT LONG TERM GOAL #2   Title B LE strength >/= 4/5 for improved gait stability by 03/14/17   Status Partially Met     PT LONG TERM GOAL #3   Title Berg >/= 48/56 to reduce risk for falls by 03/14/17   Status On-going     PT LONG TERM GOAL #4   Title DGI >/= 20/24 to improve gait safety and stability by 03/14/17   Status On-going     PT LONG TERM GOAL #5   Title Pt will ambulate with normal gait pattern w/o evidence of L foot drop/slap by 03/14/17   Status On-going               Plan - 02/24/17 1409    Clinical Impression Statement Pt thrilled that she is able to tolerate more exercises at home w/o pain, esp 4 way standing resisted SLR with yellow TB. Treatment initially focused on 4 way resisted ankle exercises with yellow TB with pt able to move against band resistance for initial reps, but requiring AAROM for full ROM in DF and inversion with fatigue. Pt demonstrating improving antigravity control in L PF but unable to hold with SLS. Pt continues to feel like she is progressing on a daily basis and reports increasing confidence with daily mobility including walking on uneven ground and  stairs.   Rehab Potential Good   Clinical Impairments Affecting Rehab Potential h/o scoliosis, DDD, neuropathy, remote h/o back surgery    PT Treatment/Interventions Patient/family education;Neuromuscular re-education;Balance training;Therapeutic exercise;Therapeutic activities;Functional mobility training;Gait training;Stair training;Manual techniques;Taping;Dry needling;Electrical Stimulation;ADLs/Self Care Home Management   PT Next Visit Plan Core & LE strengthening with emphasis on hip and ankles; L foot intrinsic muscle re-education/strengthening; Proprioceptive training; ionto patch to R greater trochanter PRN (approved by MD)   Consulted and Agree with Plan of Care Patient      Patient will benefit from skilled therapeutic intervention in order to improve the following deficits and impairments:  Decreased strength, Decreased balance, Decreased coordination, Impaired sensation, Abnormal gait, Difficulty walking, Decreased activity tolerance, Postural dysfunction  Visit Diagnosis: Unsteadiness on feet  Ataxic gait  Foot drop, left  Muscle weakness (generalized)     Problem List Patient Active Problem List   Diagnosis Date Noted  . Ventricular premature beats 10/31/2015  . Heart murmur 10/31/2015    Percival Spanish, PT, MPT 02/24/2017, 4:05 PM  [facility] [address]  [place], [place], [address] Phone: [phone]   Fax:  [phone]  Name: Odessa Nishi MRN: [id number] Date of Birth: 1940-11-21

## 2017-02-27 ENCOUNTER — Ambulatory Visit: Payer: Medicare Other | Admitting: Physical Therapy

## 2017-02-27 DIAGNOSIS — R2681 Unsteadiness on feet: Secondary | ICD-10-CM | POA: Diagnosis not present

## 2017-02-27 DIAGNOSIS — M6281 Muscle weakness (generalized): Secondary | ICD-10-CM

## 2017-02-27 DIAGNOSIS — M21372 Foot drop, left foot: Secondary | ICD-10-CM

## 2017-02-27 DIAGNOSIS — R260 Ataxic gait: Secondary | ICD-10-CM

## 2017-02-27 NOTE — Therapy (Signed)
[facility] [address]  [place], [place], [address] Phone: [phone]   Fax:  [phone]  Physical Therapy Treatment  Patient Details  Name: [name] Trujillo MRN: [id number] Date of Birth: [birth year] Referring Provider: Sarina Ill, MD & Mechele Claude, PA-C  Encounter Date: 02/27/2017      PT End of Session - 02/27/17 1400    Visit Number 17   Number of Visits 20   Date for PT Re-Evaluation 03/14/17   Authorization Type UHC Medicare [place]: [place]   PT Start Time 1400   PT Stop Time 1452   PT Time Calculation (min) 52 min   Activity Tolerance Patient tolerated treatment well   Behavior During Therapy [facility] for tasks assessed/performed      Past Medical History:  Diagnosis Date  . High cholesterol   . Hypothyroidism   . Neuropathy     Past Surgical History:  Procedure Laterality Date  . APPENDECTOMY     67-20 yo  . BACK SURGERY     Age 69  . CESAREAN SECTION    . ECTOPIC PREGNANCY SURGERY    . LAPAROSCOPIC HYSTERECTOMY      There were no vitals filed for this visit.      Subjective Assessment - 02/27/17 1400    Subjective Pt reporting she was able to work in the garden for several hours this morning including walking on uneven ground.   Pertinent History Remote h/o surgery on L sciatic nerve; h/o lumbar HNP; pes planus   Patient Stated Goals "to get my balance back so I can feel safer in the yard."   Currently in Pain? Yes   Pain Score 1    Pain Location Hip   Pain Orientation Right   Pain Descriptors / Indicators Dull;Aching;Discomfort                         [facility] Adult PT Treatment/Exercise - 02/27/17 1400      Knee/Hip Exercises: Aerobic   Recumbent Bike lvl 3 x 6'     Knee/Hip Exercises: Standing   Functional Squat 15 reps;3 seconds   Functional Squat Limitations minisquat on inverted BOSU   Walking with Sports Cord B side-stepping with looped yellow TB at ankles 2 x  48f   Other Standing Knee Exercises inverted BOSU - ant/post & med/lat weight shift x10   Other Standing Knee Exercises Fitter B hip abduction & extension (1 black) x15 each             Balance Exercises - 02/27/17 1400      Balance Exercises: Standing   SLS with Vectors Foam/compliant surface;Upper extremity assist 1;5 reps  blue foam oval, looped yellow TB at ankles, 1 pole A   Tandem Gait Foam/compliant surface;Intermittent upper extremity support;Forward;5 reps;Retro;2 reps  foam balance beam   Sidestepping Foam/compliant support;4 reps;Upper extremity support  + cone knock-over & righting last pass in each direction           PT Education - 02/27/17 1457    Education provided Yes   Education Details sidestepping with yellow TB   Person(s) Educated Patient   Methods Explanation;Demonstration;Handout   Comprehension Verbalized understanding;Returned demonstration          PT Short Term Goals - 01/23/17 1648      PT SHORT TERM GOAL #1   Title Independent with initial HEP by 01/24/17   Status Achieved  PT SHORT TERM GOAL #2   Title Berg >/= 42 to reduce risk for falls by 01/24/17   Status Achieved           PT Long Term Goals - 02/06/17 0942      PT LONG TERM GOAL #1   Title Independent with advanced HEP by 03/14/17   Status Partially Met  met for current HEP     PT LONG TERM GOAL #2   Title B LE strength >/= 4/5 for improved gait stability by 03/14/17   Status Partially Met     PT LONG TERM GOAL #3   Title Berg >/= 48/56 to reduce risk for falls by 03/14/17   Status On-going     PT LONG TERM GOAL #4   Title DGI >/= 20/24 to improve gait safety and stability by 03/14/17   Status On-going     PT LONG TERM GOAL #5   Title Pt will ambulate with normal gait pattern w/o evidence of L foot drop/slap by 03/14/17   Status On-going               Plan - 02/27/17 1501    Clinical Impression Statement Pt continues to pleased with progress with PT  noting benefit from PT with improving sense of stability on uneven surfaces and inclines while working in her yard/garden and reports pain has been minimal to nonexisitent. Tolerating exercise progression with increasing use of uneven/compliant surfaces with decreasing need for UE support.   Rehab Potential Good   Clinical Impairments Affecting Rehab Potential h/o scoliosis, DDD, neuropathy, remote h/o back surgery    PT Treatment/Interventions Patient/family education;Neuromuscular re-education;Balance training;Therapeutic exercise;Therapeutic activities;Functional mobility training;Gait training;Stair training;Manual techniques;Taping;Dry needling;Electrical Stimulation;ADLs/Self Care Home Management   PT Next Visit Plan Core & LE strengthening with emphasis on hip and ankles; L foot intrinsic muscle re-education/strengthening; Proprioceptive training; ionto patch to R greater trochanter PRN (approved by MD)   Consulted and Agree with Plan of Care Patient      Patient will benefit from skilled therapeutic intervention in order to improve the following deficits and impairments:  Decreased strength, Decreased balance, Decreased coordination, Impaired sensation, Abnormal gait, Difficulty walking, Decreased activity tolerance, Postural dysfunction  Visit Diagnosis: Unsteadiness on feet  Ataxic gait  Foot drop, left  Muscle weakness (generalized)     Problem List Patient Active Problem List   Diagnosis Date Noted  . Ventricular premature beats 10/31/2015  . Heart murmur 10/31/2015    Percival Spanish, PT, MPT 02/27/2017, 3:06 PM  [facility] [address]  [place], [place], [address] Phone: [phone]   Fax:  [phone]  Name: [name] Trujillo MRN: [id number] Date of Birth: 1940-11-19

## 2017-03-03 ENCOUNTER — Ambulatory Visit: Payer: Medicare Other | Admitting: Physical Therapy

## 2017-03-03 DIAGNOSIS — R2681 Unsteadiness on feet: Secondary | ICD-10-CM | POA: Diagnosis not present

## 2017-03-03 DIAGNOSIS — R260 Ataxic gait: Secondary | ICD-10-CM

## 2017-03-03 DIAGNOSIS — M6281 Muscle weakness (generalized): Secondary | ICD-10-CM

## 2017-03-03 DIAGNOSIS — M21372 Foot drop, left foot: Secondary | ICD-10-CM

## 2017-03-03 NOTE — Therapy (Signed)
[facility] [address]  [place], [place], [address] Phone: [phone]   Fax:  [phone]  Physical Therapy Treatment  Patient Details  Name: [name] Trujillo MRN: [id number] Date of Birth: 1940/10/27 Referring Provider: Sarina Ill, MD & Mechele Claude, PA-C  Encounter Date: 03/03/2017      PT End of Session - 03/03/17 1400    Visit Number 18   Number of Visits 20   Date for PT Re-Evaluation 03/14/17   Authorization Type UHC Medicare [place]   PT Start Time 1400   PT Stop Time 1444   PT Time Calculation (min) 44 min   Activity Tolerance Patient tolerated treatment well   Behavior During Therapy [facility] for tasks assessed/performed      Past Medical History:  Diagnosis Date  . High cholesterol   . Hypothyroidism   . Neuropathy     Past Surgical History:  Procedure Laterality Date  . APPENDECTOMY     74-20 yo  . BACK SURGERY     Age 76  . CESAREAN SECTION    . ECTOPIC PREGNANCY SURGERY    . LAPAROSCOPIC HYSTERECTOMY      There were no vitals filed for this visit.      Subjective Assessment - 03/03/17 1404    Subjective Pt continues to rave about the improvement in her balance. Questioning if she is doing the sidestepping correctly at home as she feels like her L foot is "heavier" by the time she finishes.   Pertinent History Remote h/o surgery on L sciatic nerve; h/o lumbar HNP; pes planus   Patient Stated Goals "to get my balance back so I can feel safer in the yard."   Currently in Pain? No/denies                         [facility] Adult PT Treatment/Exercise - 03/03/17 1400      Knee/Hip Exercises: Aerobic   Recumbent Bike lvl 3 x 6'     Knee/Hip Exercises: Standing   Hip Flexion Both;10 reps;Knee straight;Stengthening   Hip Flexion Limitations ab set + yellow TB, opp LE SLS on blue foam oval, UE support on back of chair   Side Lunges Both;10 reps;3 seconds   Hip ADduction  Both;10 reps;Strengthening   Hip ADduction Limitations ab set + yellow TB, opp LE SLS on blue foam oval, UE support on back of chair   Hip Abduction Both;10 reps;Knee straight;Stengthening   Abduction Limitations ab set + yellow TB, opp LE SLS on blue foam oval, UE support on back of chair   Hip Extension Both;10 reps;Knee straight;Stengthening   Extension Limitations ab set + yellow TB, opp LE SLS on blue foam oval, UE support on back of chair   Functional Squat 10 reps;3 seconds   Functional Squat Limitations TRX + heel raises   Walking with Sports Cord B side-stepping with looped yellow TB at ankles 2 x 24f     Ankle Exercises: Standing   Heel Raises 10 reps;3 seconds   Toe Raise 10 reps;3 seconds   Toe Raise Limitations eccentric lowering on L                  PT Short Term Goals - 01/23/17 1648      PT SHORT TERM GOAL #1   Title Independent with initial HEP by 01/24/17   Status Achieved     PT SHORT TERM GOAL #  2   Title Berg >/= 42 to reduce risk for falls by 01/24/17   Status Achieved           PT Long Term Goals - 02/06/17 0942      PT LONG TERM GOAL #1   Title Independent with advanced HEP by 03/14/17   Status Partially Met  met for current HEP     PT LONG TERM GOAL #2   Title B LE strength >/= 4/5 for improved gait stability by 03/14/17   Status Partially Met     PT LONG TERM GOAL #3   Title Berg >/= 48/56 to reduce risk for falls by 03/14/17   Status On-going     PT LONG TERM GOAL #4   Title DGI >/= 20/24 to improve gait safety and stability by 03/14/17   Status On-going     PT LONG TERM GOAL #5   Title Pt will ambulate with normal gait pattern w/o evidence of L foot drop/slap by 03/14/17   Status On-going               Plan - 03/03/17 1408    Clinical Impression Statement Pt continues to express her pleasure with the improvements in her balance resulting from PT. L foot DF & PF strength improving with pt now able to demonstrate symmetrical B  heel raise, but limited L eccentric control and pt's strength continues to fade with fatigue. Pt continues to tolerate progression of exercise complexity with fatigue as expected.   Rehab Potential Good   Clinical Impairments Affecting Rehab Potential h/o scoliosis, DDD, neuropathy, remote h/o back surgery    PT Treatment/Interventions Patient/family education;Neuromuscular re-education;Balance training;Therapeutic exercise;Therapeutic activities;Functional mobility training;Gait training;Stair training;Manual techniques;Taping;Dry needling;Electrical Stimulation;ADLs/Self Care Home Management   PT Next Visit Plan Core & LE strengthening with emphasis on hip and ankles; L foot intrinsic muscle re-education/strengthening; Proprioceptive training; ionto patch to R greater trochanter PRN (approved by MD)   Consulted and Agree with Plan of Care Patient      Patient will benefit from skilled therapeutic intervention in order to improve the following deficits and impairments:  Decreased strength, Decreased balance, Decreased coordination, Impaired sensation, Abnormal gait, Difficulty walking, Decreased activity tolerance, Postural dysfunction  Visit Diagnosis: Unsteadiness on feet  Ataxic gait  Foot drop, left  Muscle weakness (generalized)     Problem List Patient Active Problem List   Diagnosis Date Noted  . Ventricular premature beats 10/31/2015  . Heart murmur 10/31/2015    Percival Spanish, PT, MPT 03/03/2017, 3:10 PM  [facility] [address]  [place], [place], [address] Phone: [phone]   Fax:  [phone]  Name: [name] Trujillo MRN: [id number] Date of Birth: [birth year]

## 2017-03-06 ENCOUNTER — Ambulatory Visit: Payer: Medicare Other | Admitting: Physical Therapy

## 2017-03-10 ENCOUNTER — Ambulatory Visit: Payer: Medicare Other | Attending: Neurology | Admitting: Physical Therapy

## 2017-03-10 DIAGNOSIS — M21372 Foot drop, left foot: Secondary | ICD-10-CM | POA: Diagnosis present

## 2017-03-10 DIAGNOSIS — R260 Ataxic gait: Secondary | ICD-10-CM | POA: Insufficient documentation

## 2017-03-10 DIAGNOSIS — R2681 Unsteadiness on feet: Secondary | ICD-10-CM | POA: Insufficient documentation

## 2017-03-10 DIAGNOSIS — M6281 Muscle weakness (generalized): Secondary | ICD-10-CM | POA: Insufficient documentation

## 2017-03-10 NOTE — Therapy (Signed)
[facility] [address]  [place], [place], [address] Phone: [phone]   Fax:  [phone]  Physical Therapy Treatment  Patient Details  Name: [name] Trujillo MRN: [id number] Date of Birth: 1941/06/01 Referring Provider: Sarina Ill, MD & [name] Claude, PA-C  Encounter Date: 03/10/2017      PT End of Session - 03/10/17 1400    Visit Number 19   Number of Visits 20   Date for PT Re-Evaluation 03/14/17   Authorization Type UHC Medicare [place]   PT Start Time 7782   PT Stop Time 1446   PT Time Calculation (min) 48 min   Activity Tolerance Patient tolerated treatment well   Behavior During Therapy [facility] for tasks assessed/performed      Past Medical History:  Diagnosis Date  . High cholesterol   . Hypothyroidism   . Neuropathy     Past Surgical History:  Procedure Laterality Date  . APPENDECTOMY     70-20 yo  . BACK SURGERY     Age 59  . CESAREAN SECTION    . ECTOPIC PREGNANCY SURGERY    . LAPAROSCOPIC HYSTERECTOMY      There were no vitals filed for this visit.      Subjective Assessment - 03/10/17 1359    Subjective Bought a new pair of sneakers - thinks they are too big - both feet were hurting this weekend; hip is not bothering her like it used to. - family is noticing a difference in patient   Pertinent History Remote h/o surgery on L sciatic nerve; h/o lumbar HNP; pes planus   Patient Stated Goals "to get my balance back so I can feel safer in the yard."   Currently in Pain? No/denies   Pain Score 0-No pain                         [facility] Adult PT Treatment/Exercise - 03/10/17 1401      Neuro Re-ed    Neuro Re-ed Details  standing balance on BOSU - weight shifting laterally and anterior/posterior x 15 each; tandem heel toe walking on firm surface - forward 2 x 20 feet, backward 2 x 20 feet; SLS on disc with cone taps B LE x 15 reps     Knee/Hip Exercises: Aerobic   Recumbent Bike L2 x 6 min     Ankle Exercises: Seated   Other Seated Ankle Exercises L ankle DF over dowel with inversion/eversion x 15 reps   Other Seated Ankle Exercises patient longsitting with foot intrinsic x 15 reps with yellow tband (toe crunch, ankle PF, ankle DF, toe uncurl)                  PT Short Term Goals - 01/23/17 1648      PT SHORT TERM GOAL #1   Title Independent with initial HEP by 01/24/17   Status Achieved     PT SHORT TERM GOAL #2   Title Berg >/= 42 to reduce risk for falls by 01/24/17   Status Achieved           PT Long Term Goals - 02/06/17 0942      PT LONG TERM GOAL #1   Title Independent with advanced HEP by 03/14/17   Status Partially Met  met for current HEP     PT LONG TERM GOAL #2   Title B LE strength >/= 4/5 for improved gait  stability by 03/14/17   Status Partially Met     PT LONG TERM GOAL #3   Title Berg >/= 48/56 to reduce risk for falls by 03/14/17   Status On-going     PT LONG TERM GOAL #4   Title DGI >/= 20/24 to improve gait safety and stability by 03/14/17   Status On-going     PT LONG TERM GOAL #5   Title Pt will ambulate with normal gait pattern w/o evidence of L foot drop/slap by 03/14/17   Status On-going               Plan - 03/10/17 1400    Clinical Impression Statement [name] doing well today - good progress of all balance activities with patient noticing great improvements in overall balance. Discussion of footwear today as patient reports B foot pain with new shoes - may be due to high arch support, of which patient is not accustomed to. Patient makig good progress towards all goals.    Clinical Impairments Affecting Rehab Potential h/o scoliosis, DDD, neuropathy, remote h/o back surgery    PT Treatment/Interventions Patient/family education;Neuromuscular re-education;Balance training;Therapeutic exercise;Therapeutic activities;Functional mobility training;Gait training;Stair training;Manual  techniques;Taping;Dry needling;Electrical Stimulation;ADLs/Self Care Home Management   PT Next Visit Plan Core & LE strengthening with emphasis on hip and ankles; L foot intrinsic muscle re-education/strengthening; Proprioceptive training; ionto patch to R greater trochanter PRN (approved by MD)   Consulted and Agree with Plan of Care Patient      Patient will benefit from skilled therapeutic intervention in order to improve the following deficits and impairments:  Decreased strength, Decreased balance, Decreased coordination, Impaired sensation, Abnormal gait, Difficulty walking, Decreased activity tolerance, Postural dysfunction  Visit Diagnosis: Unsteadiness on feet  Ataxic gait  Foot drop, left  Muscle weakness (generalized)     Problem List Patient Active Problem List   Diagnosis Date Noted  . Ventricular premature beats 10/31/2015  . Heart murmur 10/31/2015     [name] Trujillo, PT, DPT 03/10/17 5:40 PM   [facility] [address]  [place], [place], [address] Phone: [phone]   Fax:  [phone]  Name: [name] Trujillo MRN: [id number] Date of Birth: June 11, 1941

## 2017-03-13 ENCOUNTER — Ambulatory Visit: Payer: Medicare Other | Admitting: Physical Therapy

## 2017-03-13 DIAGNOSIS — M6281 Muscle weakness (generalized): Secondary | ICD-10-CM

## 2017-03-13 DIAGNOSIS — M21372 Foot drop, left foot: Secondary | ICD-10-CM

## 2017-03-13 DIAGNOSIS — R2681 Unsteadiness on feet: Secondary | ICD-10-CM | POA: Diagnosis not present

## 2017-03-13 DIAGNOSIS — R260 Ataxic gait: Secondary | ICD-10-CM

## 2017-03-13 NOTE — Therapy (Addendum)
[facility] [address]  [place], [place], [address] Phone: [phone]   Fax:  [phone]  Physical Therapy Treatment  Patient Details  Name: [name] Trujillo MRN: [id number] Date of Birth: 1941/01/12 Referring Provider: Sarina Ill, MD & Mechele Claude, PA-C  Encounter Date: 03/13/2017      PT End of Session - 03/13/17 1357    Visit Number 20   Number of Visits 28   Date for PT Re-Evaluation 05/02/17  extended time period to accomodate travel plans   Authorization Type UHC [place]   PT Start Time 9021   PT Stop Time 1453   PT Time Calculation (min) 56 min   Activity Tolerance Patient tolerated treatment well   Behavior During Therapy [facility] for tasks assessed/performed      Past Medical History:  Diagnosis Date  . High cholesterol   . Hypothyroidism   . Neuropathy     Past Surgical History:  Procedure Laterality Date  . APPENDECTOMY     3-20 yo  . BACK SURGERY     Age 50  . CESAREAN SECTION    . ECTOPIC PREGNANCY SURGERY    . LAPAROSCOPIC HYSTERECTOMY      There were no vitals filed for this visit.      Subjective Assessment - 03/13/17 1358    Subjective Pt pleased with progress with PT but feels like she can continue to improve.   Pertinent History Remote h/o surgery on L sciatic nerve; h/o lumbar HNP; pes planus   Patient Stated Goals "to get my balance back so I can feel safer in the yard."   Currently in Pain? No/denies   Pain Score 0-No pain            [facility] PT Assessment - 03/13/17 1357      Assessment   Medical Diagnosis Abnormality of gait; L foot drop; Peripheral neuropathy   Referring Provider Sarina Ill, MD & Mechele Claude, PA-C   Next MD Visit none scheduled     Prior Function   Level of Independence Independent   Vocation Retired   Leisure quilting, had done [employer] prior to herniated disk     Observation/Other Assessments   Focus on Therapeutic  Outcomes (FOTO)  Foot - 51% (49% limitation); predicted 61% (39% limitation)     Strength   Right Hip Flexion 4/5   Right Hip Extension 4-/5   Right Hip External Rotation  4/5   Right Hip Internal Rotation 4-/5   Right Hip ABduction 4/5   Right Hip ADduction 4-/5   Left Hip Flexion 4/5   Left Hip Extension 4-/5   Left Hip External Rotation 4/5   Left Hip Internal Rotation 4-/5   Left Hip ABduction 4/5   Left Hip ADduction 4-/5   Right Knee Flexion 4+/5   Right Knee Extension 4+/5   Left Knee Flexion 4+/5   Left Knee Extension 4+/5   Right Ankle Dorsiflexion 4-/5   Right Ankle Plantar Flexion 4/5   Right Ankle Inversion 4/5   Right Ankle Eversion 4+/5   Left Ankle Dorsiflexion 3+/5   Left Ankle Plantar Flexion 3+/5   Left Ankle Inversion 3+/5   Left Ankle Eversion 4/5     Ambulation/Gait   Gait velocity 3.49 ft/sec     Standardized Balance Assessment   Standardized Balance Assessment Berg Balance Test;Dynamic Gait Index;10 meter walk test   10 Meter Walk 9.41     [name]  Balance Test   Sit to Stand Able to stand without using hands and stabilize independently   Standing Unsupported Able to stand safely 2 minutes   Sitting with Back Unsupported but Feet Supported on Floor or Stool Able to sit safely and securely 2 minutes   Stand to Sit Sits safely with minimal use of hands   Transfers Able to transfer safely, minor use of hands   Standing Unsupported with Eyes Closed Able to stand 10 seconds safely   Standing Ubsupported with Feet Together Able to place feet together independently and stand 1 minute safely   From Standing, Reach Forward with Outstretched Arm Can reach forward >12 cm safely (5")   From Standing Position, Pick up Object from [place] to pick up shoe safely and easily   From Standing Position, Turn to Look Behind Over each Shoulder Looks behind from both sides and weight shifts well   Turn 360 Degrees Able to turn 360 degrees safely in 4 seconds or less    Standing Unsupported, Alternately Place Feet on Step/Stool Able to stand independently and safely and complete 8 steps in 20 seconds   Standing Unsupported, One Foot in Front Able to plae foot ahead of the other independently and hold 30 seconds   Standing on One Leg Tries to lift leg/unable to hold 3 seconds but remains standing independently   Total Score 51     Dynamic Gait Index   Level Surface Mild Impairment   Change in Gait Speed Normal   Gait with Horizontal Head Turns Normal   Gait with Vertical Head Turns Mild Impairment   Gait and Pivot Turn Normal   Step Over Obstacle Mild Impairment   Step Around Obstacles Normal   Steps Mild Impairment   Total Score 20                     [facility] Adult PT Treatment/Exercise - 03/13/17 1357      Knee/Hip Exercises: Aerobic   Recumbent Bike lvl 3 x 6'     Ankle Exercises: Seated   Other Seated Ankle Exercises L ankle - yellow band resisted 4 way ankle x10 each                PT Education - 03/13/17 1458    Education provided Yes   Education Details review and thinning of existing HEPs   Person(s) Educated Patient   Methods Explanation;Demonstration;Handout   Comprehension Verbalized understanding;Returned demonstration          PT Short Term Goals - 01/23/17 1648      PT SHORT TERM GOAL #1   Title Independent with initial HEP by 01/24/17   Status Achieved     PT SHORT TERM GOAL #2   Title Berg >/= 42 to reduce risk for falls by 01/24/17   Status Achieved           PT Long Term Goals - 03/13/17 1359      PT LONG TERM GOAL #1   Title Independent with advanced HEP by 05/02/17   Status Partially Met  met for current HEP     PT LONG TERM GOAL #2   Title B LE strength >/= 4/5 for improved gait stability by 05/02/17   Status Partially Met     PT LONG TERM GOAL #3   Title Berg >/= 48/56 to reduce risk for falls by 03/14/17   Status Achieved     PT LONG TERM GOAL #4  Title DGI >/= 20/24 to improve  gait safety and stability by 03/14/17   Status Achieved     PT LONG TERM GOAL #5   Title Pt will ambulate with normal gait pattern w/o evidence of L foot drop/slap by 05/02/17   Status On-going     PT LONG TERM GOAL #6   Title Pt will perform sit <> stand from low seat height (14-15") w/o need for UE assist to allow increased ease of public toilet transfers by 05/02/17   Status New     PT LONG TERM GOAL #7   Title Pt will ascend/descend stairs with reciprocal pattern w/o railing or UE support to increase safety with community mobility by 05/02/17   Status New               Plan - Mar 20, 2017 1359    Clinical Impression Statement Laynie continues to demonstrate consistent progress with PT with improving B hip and ankle strength and functional mobility, although notes difficulty rising from low seats such as public toilets and continued difficulty with reciprocal stair negotiation. Gait speed currently 3.49 ft/sec with decreasing incidence of foot drop/slap, although pt continues to report intermittent occurence at home. Pt reporting increased confidence and stability with ambulation on uneven surfaces, with significant gains noted in static and dynamic balance as evidenced by improvements in Berg to 51/56 from 35/56 on initial eval and DGI to 20/24 from 14/24 on eval. Balance goals now met with remaining goals only partially met or ongoing. Given current progress and continued motivation, pt demonstrates good/excellent potential to further benefit from skilled PT to [facility] functional strength and safety with mobility.   Rehab Potential Good   Clinical Impairments Affecting Rehab Potential h/o scoliosis, DDD, neuropathy, remote h/o back surgery    PT Frequency 2x / week   PT Duration 4 weeks  POC adjusted to accomodate pt's travel plans   PT Treatment/Interventions Patient/family education;Neuromuscular re-education;Balance training;Therapeutic exercise;Therapeutic activities;Functional  mobility training;Gait training;Stair training;Manual techniques;Taping;Dry needling;Electrical Stimulation;ADLs/Self Care Home Management   PT Next Visit Plan Core & LE strengthening with emphasis on hip and ankles; L foot intrinsic muscle re-education/strengthening; Proprioceptive training; ionto patch to R greater trochanter PRN (approved by MD)   Consulted and Agree with Plan of Care Patient      Patient will benefit from skilled therapeutic intervention in order to improve the following deficits and impairments:  Decreased strength, Decreased balance, Decreased coordination, Impaired sensation, Abnormal gait, Difficulty walking, Decreased activity tolerance, Postural dysfunction  Visit Diagnosis: Unsteadiness on feet  Ataxic gait  Foot drop, left  Muscle weakness (generalized)     G-Codes - Mar 20, 2017    Functional Assessment Tool Used (Outpatient Only) DGI = 14/24Foot FOTO = 51% (49% limitation)   Functional Limitation Mobility: Walking and moving around   Mobility: Walking and Moving Around Current Status (D7412) At least 40 percent but less than 60 percent impaired, limited or restricted   Mobility: Walking and Moving Around Goal Status (I7867) At least 40 percent but less than 60 percent impaired, limited or restricted     Problem List Patient Active Problem List   Diagnosis Date Noted  . Ventricular premature beats 10/31/2015  . Heart murmur 10/31/2015    Percival Spanish, PT, MPT 03/20/17, 3:19 PM  [facility] [address]  [place], [place], [address] Phone: [phone]   Fax:  [phone]  Name: Tahnee Cifuentes MRN: [id number] Date of Birth: 11/21/1940

## 2017-03-27 ENCOUNTER — Ambulatory Visit: Payer: Medicare Other | Admitting: Physical Therapy

## 2017-03-31 ENCOUNTER — Ambulatory Visit: Payer: Medicare Other | Admitting: Physical Therapy

## 2017-03-31 DIAGNOSIS — R260 Ataxic gait: Secondary | ICD-10-CM

## 2017-03-31 DIAGNOSIS — M6281 Muscle weakness (generalized): Secondary | ICD-10-CM

## 2017-03-31 DIAGNOSIS — R2681 Unsteadiness on feet: Secondary | ICD-10-CM | POA: Diagnosis not present

## 2017-03-31 DIAGNOSIS — M21372 Foot drop, left foot: Secondary | ICD-10-CM

## 2017-03-31 NOTE — Therapy (Signed)
[facility] [address]  [address] [place], [place], [address] Phone: [phone]   Fax:  [phone]  Physical Therapy Treatment  Patient Details  Name: [name] Trujillo MRN: [id number] Date of Birth: 1941-03-21 Referring Provider: Naomie Dean, MD & Alfredo Martinez, PA-C  Encounter Date: 03/31/2017      PT End of Session - 03/31/17 1404    Visit Number 21   Number of Visits 28   Date for PT Re-Evaluation 05/02/17   Authorization Type UHC Medicare [place]   PT Start Time 1404   PT Stop Time 1447   PT Time Calculation (min) 43 min   Activity Tolerance Patient tolerated treatment well   Behavior During Therapy [facility] for tasks assessed/performed      Past Medical History:  Diagnosis Date  . High cholesterol   . Hypothyroidism   . Neuropathy     Past Surgical History:  Procedure Laterality Date  . APPENDECTOMY     53-20 yo  . BACK SURGERY     Age 32  . CESAREAN SECTION    . ECTOPIC PREGNANCY SURGERY    . LAPAROSCOPIC HYSTERECTOMY      There were no vitals filed for this visit.      Subjective Assessment - 03/31/17 1410    Subjective Pt noting good activity tolerance while away from PT - walking on beach, around on inclines at [place]. [place]. Did note 1 near fall when climbing up to steps - feels like she did not have heel far enough on step. Pain good other than when riding for extended periods in car.   Pertinent History Remote h/o surgery on L sciatic nerve; h/o lumbar HNP; pes planus   Patient Stated Goals "to get my balance back so I can feel safer in the yard."   Currently in Pain? No/denies   Pain Score 0-No pain                         [facility] Adult PT Treatment/Exercise - 03/31/17 1404      Knee/Hip Exercises: Aerobic   Recumbent Bike lvl 3 x 6'     Knee/Hip Exercises: Standing   Walking with Sports Cord B side-stepping with looped red TB at ankles 2 x 83ft     Ankle Exercises: Standing    Heel Raises 10 reps;3 seconds   Heel Raises Limitations eccentric lowering with majority of weight shifted to L   Toe Raise 10 reps;3 seconds   Toe Raise Limitations limited toe clearnce on L   Other Standing Ankle Exercises B side-stepping with looped yellow TB at forefoot ankles 2 x 13ft                  PT Short Term Goals - 01/23/17 1648      PT SHORT TERM GOAL #1   Title Independent with initial HEP by 01/24/17   Status Achieved     PT SHORT TERM GOAL #2   Title Berg >/= 42 to reduce risk for falls by 01/24/17   Status Achieved           PT Long Term Goals - 03/13/17 1359      PT LONG TERM GOAL #1   Title Independent with advanced HEP by 05/02/17   Status Partially Met  met for current HEP     PT LONG TERM GOAL #2   Title B LE strength >/= 4/5 for improved  gait stability by 05/02/17   Status Partially Met     PT LONG TERM GOAL #3   Title Berg >/= 48/56 to reduce risk for falls by 03/14/17   Status Achieved     PT LONG TERM GOAL #4   Title DGI >/= 20/24 to improve gait safety and stability by 03/14/17   Status Achieved     PT LONG TERM GOAL #5   Title Pt will ambulate with normal gait pattern w/o evidence of L foot drop/slap by 05/02/17   Status On-going     PT LONG TERM GOAL #6   Title Pt will perform sit <> stand from low seat height (14-15") w/o need for UE assist to allow increased ease of public toilet transfers by 05/02/17   Status New     PT LONG TERM GOAL #7   Title Pt will ascend/descend stairs with reciprocal pattern w/o railing or UE support to increase safety with community mobility by 05/02/17   Status New               Plan - 03/31/17 1414    Clinical Impression Statement Pt returning from vacation reporting increasing activity tolerance and improved PF strength on L, now able to perform B heel raise with symmetrical lift, although still with poor unilateral eccentric control on L. Pt able to progress sidestepping to red TB at ankles  and initiate sidestepping with yellow TB at forefoot. Still demostrating limited ROM with 4 way resisted ankle with yellow TB but good isometric contraction noted where ROM limited.   Rehab Potential Good   Clinical Impairments Affecting Rehab Potential h/o scoliosis, DDD, neuropathy, remote h/o back surgery    PT Frequency 2x / week   PT Duration 4 weeks  POC adjusted to accomodate pt's travel plans   PT Treatment/Interventions Patient/family education;Neuromuscular re-education;Balance training;Therapeutic exercise;Therapeutic activities;Functional mobility training;Gait training;Stair training;Manual techniques;Taping;Dry needling;Electrical Stimulation;ADLs/Self Care Home Management   PT Next Visit Plan Core & LE strengthening with emphasis on hip and ankles; L foot intrinsic muscle re-education/strengthening; Proprioceptive training; ionto patch to R greater trochanter PRN (approved by MD)   Consulted and Agree with Plan of Care Patient      Patient will benefit from skilled therapeutic intervention in order to improve the following deficits and impairments:  Decreased strength, Decreased balance, Decreased coordination, Impaired sensation, Abnormal gait, Difficulty walking, Decreased activity tolerance, Postural dysfunction  Visit Diagnosis: Unsteadiness on feet  Ataxic gait  Foot drop, left  Muscle weakness (generalized)     Problem List Patient Active Problem List   Diagnosis Date Noted  . Ventricular premature beats 10/31/2015  . Heart murmur 10/31/2015    Percival Spanish, PT, MPT 03/31/2017, 3:47 PM  [facility] [address]  [place], [place], [address] Phone: [phone]   Fax:  [phone]  Name: [name] Trujillo MRN: [id number] Date of Birth: [birth year]

## 2017-04-03 ENCOUNTER — Ambulatory Visit: Payer: Medicare Other | Admitting: Physical Therapy

## 2017-04-03 DIAGNOSIS — R260 Ataxic gait: Secondary | ICD-10-CM

## 2017-04-03 DIAGNOSIS — R2681 Unsteadiness on feet: Secondary | ICD-10-CM | POA: Diagnosis not present

## 2017-04-03 DIAGNOSIS — M6281 Muscle weakness (generalized): Secondary | ICD-10-CM

## 2017-04-03 DIAGNOSIS — M21372 Foot drop, left foot: Secondary | ICD-10-CM

## 2017-04-03 NOTE — Therapy (Signed)
[facility] [address]  [place], [place], [address] Phone: [phone]   Fax:  [phone]  Physical Therapy Treatment  Patient Details  Name: [name] Trujillo MRN: [id number] Date of Birth: 1941/07/19 Referring Provider: Sarina Ill, MD & Mechele Claude, PA-C  Encounter Date: 04/03/2017      PT End of Session - 04/03/17 1441    Visit Number 22   Number of Visits 28   Date for PT Re-Evaluation 05/02/17   Authorization Type UHC Medicare [place]: [place]   PT Start Time 2703   PT Stop Time 1528   PT Time Calculation (min) 47 min   Activity Tolerance Patient tolerated treatment well   Behavior During Therapy [facility] for tasks assessed/performed      Past Medical History:  Diagnosis Date  . High cholesterol   . Hypothyroidism   . Neuropathy     Past Surgical History:  Procedure Laterality Date  . APPENDECTOMY     72-20 yo  . BACK SURGERY     Age 22  . CESAREAN SECTION    . ECTOPIC PREGNANCY SURGERY    . LAPAROSCOPIC HYSTERECTOMY      There were no vitals filed for this visit.      Subjective Assessment - 04/03/17 1445    Subjective Pt reporting she was unable to locate her looped red TB, therefore continued to use yellow band.   Pertinent History Remote h/o surgery on L sciatic nerve; h/o lumbar HNP; pes planus   Patient Stated Goals "to get my balance back so I can feel safer in the yard."   Currently in Pain? No/denies   Pain Score 0-No pain                         [facility] Adult PT Treatment/Exercise - 04/03/17 1441      Knee/Hip Exercises: Aerobic   Recumbent Bike lvl 3 x 6'     Knee/Hip Exercises: Standing   Step Down Both;20 reps;Step Height: 4";Hand Hold: 2   Step Down Limitations lateral & fwd eccentric lowering with heel touch each, 2 pole A   Functional Squat 15 reps;3 seconds   Functional Squat Limitations TRX + heel raises   Other Standing Knee Exercises B hip ABD/ext with  looped red TB at 45 dg angle x20             Balance Exercises - 04/03/17 1441      Balance Exercises: Standing   Tandem Gait Forward;Retro;Intermittent upper extremity support;5 reps   Sidestepping Foam/compliant support;4 reps;Upper extremity support  + cone knock-over & righting last pass in each direction             PT Short Term Goals - 01/23/17 1648      PT SHORT TERM GOAL #1   Title Independent with initial HEP by 01/24/17   Status Achieved     PT SHORT TERM GOAL #2   Title Berg >/= 42 to reduce risk for falls by 01/24/17   Status Achieved           PT Long Term Goals - 03/13/17 1359      PT LONG TERM GOAL #1   Title Independent with advanced HEP by 05/02/17   Status Partially Met  met for current HEP     PT LONG TERM GOAL #2   Title B LE strength >/= 4/5 for improved gait stability by 05/02/17  Status Partially Met     PT LONG TERM GOAL #3   Title Berg >/= 48/56 to reduce risk for falls by 03/14/17   Status Achieved     PT LONG TERM GOAL #4   Title DGI >/= 20/24 to improve gait safety and stability by 03/14/17   Status Achieved     PT LONG TERM GOAL #5   Title Pt will ambulate with normal gait pattern w/o evidence of L foot drop/slap by 05/02/17   Status On-going     PT LONG TERM GOAL #6   Title Pt will perform sit <> stand from low seat height (14-15") w/o need for UE assist to allow increased ease of public toilet transfers by 05/02/17   Status New     PT LONG TERM GOAL #7   Title Pt will ascend/descend stairs with reciprocal pattern w/o railing or UE support to increase safety with community mobility by 05/02/17   Status New               Plan - 04/03/17 1447    Clinical Impression Statement Continued focus on proximal stability/strength to reduce trendelenburg gait with pt tolerating increased reps with most exercises. Worked balance with narrow BOS in fwd/back tandem gait as well as dynamic activities on uneven/compliant surfaces with  pt demonstrating improving control but continuing to rely on intermittent UE support.   Rehab Potential Good   Clinical Impairments Affecting Rehab Potential h/o scoliosis, DDD, neuropathy, remote h/o back surgery    PT Frequency 2x / week   PT Duration 4 weeks  POC adjusted to accomodate pt's travel plans   PT Treatment/Interventions Patient/family education;Neuromuscular re-education;Balance training;Therapeutic exercise;Therapeutic activities;Functional mobility training;Gait training;Stair training;Manual techniques;Taping;Dry needling;Electrical Stimulation;ADLs/Self Care Home Management   PT Next Visit Plan Core & LE strengthening with emphasis on hip and ankles; L foot intrinsic muscle re-education/strengthening; Proprioceptive training; ionto patch to R greater trochanter PRN (approved by MD)   Consulted and Agree with Plan of Care Patient      Patient will benefit from skilled therapeutic intervention in order to improve the following deficits and impairments:  Decreased strength, Decreased balance, Decreased coordination, Impaired sensation, Abnormal gait, Difficulty walking, Decreased activity tolerance, Postural dysfunction  Visit Diagnosis: Unsteadiness on feet  Ataxic gait  Foot drop, left  Muscle weakness (generalized)     Problem List Patient Active Problem List   Diagnosis Date Noted  . Ventricular premature beats 10/31/2015  . Heart murmur 10/31/2015    Percival Spanish, PT, MPT 04/03/2017, 3:31 PM  [facility] [address]  [place], [place], [address] Phone: [phone]   Fax:  [phone]  Name: [name] Trujillo MRN: [id number] Date of Birth: 02/02/1941

## 2017-04-07 ENCOUNTER — Ambulatory Visit: Payer: Medicare Other | Admitting: Physical Therapy

## 2017-04-10 ENCOUNTER — Ambulatory Visit: Payer: Medicare Other | Attending: Neurology | Admitting: Physical Therapy

## 2017-04-10 DIAGNOSIS — M21372 Foot drop, left foot: Secondary | ICD-10-CM

## 2017-04-10 DIAGNOSIS — R260 Ataxic gait: Secondary | ICD-10-CM

## 2017-04-10 DIAGNOSIS — M6281 Muscle weakness (generalized): Secondary | ICD-10-CM | POA: Diagnosis present

## 2017-04-10 DIAGNOSIS — R2681 Unsteadiness on feet: Secondary | ICD-10-CM | POA: Insufficient documentation

## 2017-04-10 NOTE — Therapy (Signed)
[facility] [address]  [place], [place], [address] Phone: [phone]   Fax:  [phone]  Physical Therapy Treatment  Patient Details  Name: [name] Trujillo MRN: [id number] Date of Birth: May 17, 1941 Referring Provider: Sarina Ill, MD & Mechele Claude, PA-C  Encounter Date: 04/10/2017      PT End of Session - 04/10/17 1446    Visit Number 23   Number of Visits 28   Date for PT Re-Evaluation 05/02/17   Authorization Type UHC Medicare [place]   PT Start Time 1446   PT Stop Time 1534   PT Time Calculation (min) 48 min   Activity Tolerance Patient tolerated treatment well   Behavior During Therapy [facility] for tasks assessed/performed      Past Medical History:  Diagnosis Date  . High cholesterol   . Hypothyroidism   . Neuropathy     Past Surgical History:  Procedure Laterality Date  . APPENDECTOMY     46-20 yo  . BACK SURGERY     Age 15  . CESAREAN SECTION    . ECTOPIC PREGNANCY SURGERY    . LAPAROSCOPIC HYSTERECTOMY      There were no vitals filed for this visit.      Subjective Assessment - 04/10/17 1456    Subjective Pt reporting fatigue with traveling to [facility] over last weekend but no other issues. Noting numbness is decreasing.   Pertinent History Remote h/o surgery on L sciatic nerve; h/o lumbar HNP; pes planus   Patient Stated Goals "to get my balance back so I can feel safer in the yard."   Currently in Pain? No/denies                         [facility] Adult PT Treatment/Exercise - 04/10/17 1446      Knee/Hip Exercises: Stretches   Piriformis Stretch Both;30 seconds;1 rep   Piriformis Stretch Limitations seated   Other Knee/Hip Stretches seated & standing self-release to piriformis using small ball x1' each     Knee/Hip Exercises: Aerobic   Recumbent Bike lvl 3 x 6'     Knee/Hip Exercises: Standing   Other Standing Knee Exercises alt toe-clears from Airex pad to 9"  stool x20             Balance Exercises - 04/10/17 1446      Balance Exercises: Standing   Standing Eyes Opened Foam/compliant surface;Narrow base of support (BOS)  bean bag reach & toss x30   Tandem Gait Foam/compliant surface;Forward;Retro;4 reps;Intermittent upper extremity support   Sidestepping Foam/compliant support;3 reps  feet centered on foam beam, heels on bean, toes on beam             PT Short Term Goals - 01/23/17 1648      PT SHORT TERM GOAL #1   Title Independent with initial HEP by 01/24/17   Status Achieved     PT SHORT TERM GOAL #2   Title Berg >/= 42 to reduce risk for falls by 01/24/17   Status Achieved           PT Long Term Goals - 03/13/17 1359      PT LONG TERM GOAL #1   Title Independent with advanced HEP by 05/02/17   Status Partially Met  met for current HEP     PT LONG TERM GOAL #2   Title B LE strength >/= 4/5 for improved gait stability by  05/02/17   Status Partially Met     PT LONG TERM GOAL #3   Title Berg >/= 48/56 to reduce risk for falls by 03/14/17   Status Achieved     PT LONG TERM GOAL #4   Title DGI >/= 20/24 to improve gait safety and stability by 03/14/17   Status Achieved     PT LONG TERM GOAL #5   Title Pt will ambulate with normal gait pattern w/o evidence of L foot drop/slap by 05/02/17   Status On-going     PT LONG TERM GOAL #6   Title Pt will perform sit <> stand from low seat height (14-15") w/o need for UE assist to allow increased ease of public toilet transfers by 05/02/17   Status New     PT LONG TERM GOAL #7   Title Pt will ascend/descend stairs with reciprocal pattern w/o railing or UE support to increase safety with community mobility by 05/02/17   Status New               Plan - 04/10/17 1507    Clinical Impression Statement Pt continues to be proud of her progress, reporting improving stability and strength. Notes still with limited hip ER preventing her from working on gravity resisted  ankle TB exercises, therefore reviewed piriformis stretches and instructed in self-release for piriformis and glutes. Pt otherwise confident with HEP, but feels like balance is an area in need of further improvement, therefore remainder of visit focusing on balance related activities.   Rehab Potential Good   Clinical Impairments Affecting Rehab Potential h/o scoliosis, DDD, neuropathy, remote h/o back surgery    PT Frequency 2x / week   PT Duration 4 weeks  POC adjusted to accomodate pt's travel plans   PT Treatment/Interventions Patient/family education;Neuromuscular re-education;Balance training;Therapeutic exercise;Therapeutic activities;Functional mobility training;Gait training;Stair training;Manual techniques;Taping;Dry needling;Electrical Stimulation;ADLs/Self Care Home Management   PT Next Visit Plan Core & LE strengthening with emphasis on hip and ankles; L foot intrinsic muscle re-education/strengthening; Proprioceptive training; ionto patch to R greater trochanter PRN (approved by MD)   Consulted and Agree with Plan of Care Patient      Patient will benefit from skilled therapeutic intervention in order to improve the following deficits and impairments:  Decreased strength, Decreased balance, Decreased coordination, Impaired sensation, Abnormal gait, Difficulty walking, Decreased activity tolerance, Postural dysfunction  Visit Diagnosis: Unsteadiness on feet  Ataxic gait  Foot drop, left  Muscle weakness (generalized)     Problem List Patient Active Problem List   Diagnosis Date Noted  . Ventricular premature beats 10/31/2015  . Heart murmur 10/31/2015    Percival Spanish, PT, MPT 04/10/2017, 4:09 PM  [facility] [address]  [place], [place], [address] Phone: [phone]   Fax:  [phone]  Name: [name] Trujillo MRN: [id number] Date of Birth: [birth year]

## 2017-04-21 ENCOUNTER — Ambulatory Visit: Payer: Medicare Other | Admitting: Physical Therapy

## 2017-04-21 DIAGNOSIS — R260 Ataxic gait: Secondary | ICD-10-CM

## 2017-04-21 DIAGNOSIS — R2681 Unsteadiness on feet: Secondary | ICD-10-CM | POA: Diagnosis not present

## 2017-04-21 DIAGNOSIS — M21372 Foot drop, left foot: Secondary | ICD-10-CM

## 2017-04-21 DIAGNOSIS — M6281 Muscle weakness (generalized): Secondary | ICD-10-CM

## 2017-04-21 NOTE — Therapy (Addendum)
[facility] [address]  [place], [place], [address] Phone: [phone]   Fax:  [phone]  Physical Therapy Treatment  Patient Details  Name: [name] Trujillo MRN: [id number] Date of Birth: [birth year] Referring Provider: Sarina Ill, MD & Mechele Claude, PA-C  Encounter Date: 04/21/2017      PT End of Session - 04/21/17 1400    Visit Number 24   Number of Visits 28   Date for PT Re-Evaluation 05/02/17   Authorization Type UHC Medicare [place]: [place]   PT Start Time 1400   PT Stop Time 1447   PT Time Calculation (min) 47 min   Activity Tolerance Patient tolerated treatment well   Behavior During Therapy [facility] for tasks assessed/performed      Past Medical History:  Diagnosis Date  . High cholesterol   . Hypothyroidism   . Neuropathy     Past Surgical History:  Procedure Laterality Date  . APPENDECTOMY     1-20 yo  . BACK SURGERY     Age 53  . CESAREAN SECTION    . ECTOPIC PREGNANCY SURGERY    . LAPAROSCOPIC HYSTERECTOMY      There were no vitals filed for this visit.      Subjective Assessment - 04/21/17 1402    Subjective Pt reports limited completion of HEP while on vacation.   Pertinent History Remote h/o surgery on L sciatic nerve; h/o lumbar HNP; pes planus   Patient Stated Goals "to get my balance back so I can feel safer in the yard."   Currently in Pain? No/denies   Pain Score 0-No pain                         [facility] Adult PT Treatment/Exercise - 04/21/17 1400      Knee/Hip Exercises: Aerobic   Recumbent Bike lvl 3 x 6'     Knee/Hip Exercises: Standing   Functional Squat 15 reps;3 seconds   Functional Squat Limitations on inverted BOSU   SLS with Vectors B SLS clocks x5 - added to HEP             Balance Exercises - 04/21/17 1435      Balance Exercises: Standing   Tandem Gait Foam/compliant surface;Forward;Retro;Intermittent upper extremity support;5 reps    Sidestepping Foam/compliant support;4 reps  midfoot/heels/toes on beam, cone knock-down/righting           PT Education - 04/21/17 1447    Education provided Yes   Education Details balance clocks added to HEP   Person(s) Educated Patient   Methods Explanation;Demonstration;Handout   Comprehension Verbalized understanding;Returned demonstration          PT Short Term Goals - 01/23/17 1648      PT SHORT TERM GOAL #1   Title Independent with initial HEP by 01/24/17   Status Achieved     PT SHORT TERM GOAL #2   Title Berg >/= 42 to reduce risk for falls by 01/24/17   Status Achieved           PT Long Term Goals - 03/13/17 1359      PT LONG TERM GOAL #1   Title Independent with advanced HEP by 05/02/17   Status Partially Met  met for current HEP     PT LONG TERM GOAL #2   Title B LE strength >/= 4/5 for improved gait stability by 05/02/17   Status Partially Met  PT LONG TERM GOAL #3   Title Berg >/= 48/56 to reduce risk for falls by 03/14/17   Status Achieved     PT LONG TERM GOAL #4   Title DGI >/= 20/24 to improve gait safety and stability by 03/14/17   Status Achieved     PT LONG TERM GOAL #5   Title Pt will ambulate with normal gait pattern w/o evidence of L foot drop/slap by 05/02/17   Status On-going     PT LONG TERM GOAL #6   Title Pt will perform sit <> stand from low seat height (14-15") w/o need for UE assist to allow increased ease of public toilet transfers by 05/02/17   Status New     PT LONG TERM GOAL #7   Title Pt will ascend/descend stairs with reciprocal pattern w/o railing or UE support to increase safety with community mobility by 05/02/17   Status New               Plan - 04/21/17 1404    Clinical Impression Statement Pt returning after vacation reporting limited completion of HEP while away and noting some decline in balance as a result. Pt also reporting worsening of LE numbness with one day recently waking up with both LE feeling  numb and L foot with persistantly worse than usual numbess. Given reports of worsening numbness, encouraged pt to contact MD to determine if further testing or f/u indicated.   Rehab Potential Good   Clinical Impairments Affecting Rehab Potential h/o scoliosis, DDD, neuropathy, remote h/o back surgery    PT Frequency 2x / week   PT Duration 4 weeks  POC adjusted to accomodate pt's travel plans   PT Treatment/Interventions Patient/family education;Neuromuscular re-education;Balance training;Therapeutic exercise;Therapeutic activities;Functional mobility training;Gait training;Stair training;Manual techniques;Taping;Dry needling;Electrical Stimulation;ADLs/Self Care Home Management   PT Next Visit Plan Core & LE strengthening with emphasis on hip and ankles; L foot intrinsic muscle re-education/strengthening; Proprioceptive training; ionto patch to R greater trochanter PRN (approved by MD)   Consulted and Agree with Plan of Care Patient      Patient will benefit from skilled therapeutic intervention in order to improve the following deficits and impairments:  Decreased strength, Decreased balance, Decreased coordination, Impaired sensation, Abnormal gait, Difficulty walking, Decreased activity tolerance, Postural dysfunction  Visit Diagnosis: Unsteadiness on feet  Ataxic gait  Foot drop, left  Muscle weakness (generalized)     Problem List Patient Active Problem List   Diagnosis Date Noted  . Ventricular premature beats 10/31/2015  . Heart murmur 10/31/2015    Percival Spanish, PT, MPT 04/21/2017, 3:27 PM  [facility] [address]  [place], [place], [address] Phone: [phone]   Fax:  [phone]  Name: [name] Trujillo MRN: [id number] Date of Birth: 1941/01/10

## 2017-04-24 ENCOUNTER — Ambulatory Visit: Payer: Medicare Other | Admitting: Physical Therapy

## 2017-04-24 DIAGNOSIS — R260 Ataxic gait: Secondary | ICD-10-CM

## 2017-04-24 DIAGNOSIS — M21372 Foot drop, left foot: Secondary | ICD-10-CM

## 2017-04-24 DIAGNOSIS — M6281 Muscle weakness (generalized): Secondary | ICD-10-CM

## 2017-04-24 DIAGNOSIS — R2681 Unsteadiness on feet: Secondary | ICD-10-CM

## 2017-04-24 NOTE — Therapy (Signed)
[facility] [address]  [place], [place], [address] Phone: [phone]   Fax:  [phone]  Physical Therapy Treatment  Patient Details  Name: [name] Trujillo MRN: [id number] Date of Birth: [birth year] Referring Provider: Sarina Ill, MD & Mechele Claude, PA-C  Encounter Date: 04/24/2017      PT End of Session - 04/24/17 1448    Visit Number 25   Number of Visits 28   Date for PT Re-Evaluation 05/02/17   Authorization Type UHC Medicare [place]: [place]   PT Start Time 1448   PT Stop Time 1531   PT Time Calculation (min) 43 min   Activity Tolerance Patient tolerated treatment well   Behavior During Therapy [facility] for tasks assessed/performed      Past Medical History:  Diagnosis Date  . High cholesterol   . Hypothyroidism   . Neuropathy     Past Surgical History:  Procedure Laterality Date  . APPENDECTOMY     46-18 yo  . BACK SURGERY     Age 37  . CESAREAN SECTION    . ECTOPIC PREGNANCY SURGERY    . LAPAROSCOPIC HYSTERECTOMY      There were no vitals filed for this visit.      Subjective Assessment - 04/24/17 1454    Subjective Pt reporting feeling better today - increased numbness noted last visit has returned back to normal.   Pertinent History Remote h/o surgery on L sciatic nerve; h/o lumbar HNP; pes planus   Patient Stated Goals "to get my balance back so I can feel safer in the yard."   Currently in Pain? No/denies   Pain Score 0-No pain                         [facility] Adult PT Treatment/Exercise - 04/24/17 1448      Knee/Hip Exercises: Standing   Forward Lunges Right;Left;15 reps;3 seconds   Forward Lunges Limitations to BOSU (up), 1-2 pole A   Forward Step Up Right;Left;15 reps;Hand Hold: 1;Hand Hold: 2   Forward Step Up Limitations to BOSU (up), 1-2 pole A   Functional Squat 15 reps;3 seconds   Functional Squat Limitations on inverted BOSU   SLS B SLS on inverted BOSU 3x15"  (B HHA of PT)   Other Standing Knee Exercises Fitter B hip abduction & extension (1 black) x15 each                  PT Short Term Goals - 01/23/17 1648      PT SHORT TERM GOAL #1   Title Independent with initial HEP by 01/24/17   Status Achieved     PT SHORT TERM GOAL #2   Title Berg >/= 42 to reduce risk for falls by 01/24/17   Status Achieved           PT Long Term Goals - 03/13/17 1359      PT LONG TERM GOAL #1   Title Independent with advanced HEP by 05/02/17   Status Partially Met  met for current HEP     PT LONG TERM GOAL #2   Title B LE strength >/= 4/5 for improved gait stability by 05/02/17   Status Partially Met     PT LONG TERM GOAL #3   Title Berg >/= 48/56 to reduce risk for falls by 03/14/17   Status Achieved     PT LONG TERM GOAL #4  Title DGI >/= 20/24 to improve gait safety and stability by 03/14/17   Status Achieved     PT LONG TERM GOAL #5   Title Pt will ambulate with normal gait pattern w/o evidence of L foot drop/slap by 05/02/17   Status On-going     PT LONG TERM GOAL #6   Title Pt will perform sit <> stand from low seat height (14-15") w/o need for UE assist to allow increased ease of public toilet transfers by 05/02/17   Status New     PT LONG TERM GOAL #7   Title Pt will ascend/descend stairs with reciprocal pattern w/o railing or UE support to increase safety with community mobility by 05/02/17   Status New               Plan - 04/24/17 1456    Clinical Impression Statement Pt reporting numbness has returned back to prior level and feeling better overall, therefore she has decided not to make appt with MD at present. Continued focus on strengthening incorporating balance activities with pt continuing to require SBA to CGA/HHA of PT with many activities. At pt's request also briefly addressed exercises and activities to promote more upright posture.   Rehab Potential Good   Clinical Impairments Affecting Rehab Potential h/o  scoliosis, DDD, neuropathy, remote h/o back surgery    PT Frequency 2x / week   PT Duration 4 weeks  POC adjusted to accomodate pt's travel plans   PT Treatment/Interventions Patient/family education;Neuromuscular re-education;Balance training;Therapeutic exercise;Therapeutic activities;Functional mobility training;Gait training;Stair training;Manual techniques;Taping;Dry needling;Electrical Stimulation;ADLs/Self Care Home Management   PT Next Visit Plan Core & LE strengthening with emphasis on hip and ankles; L foot intrinsic muscle re-education/strengthening; Proprioceptive training; ionto patch to R greater trochanter PRN (approved by MD)   Consulted and Agree with Plan of Care Patient      Patient will benefit from skilled therapeutic intervention in order to improve the following deficits and impairments:  Decreased strength, Decreased balance, Decreased coordination, Impaired sensation, Abnormal gait, Difficulty walking, Decreased activity tolerance, Postural dysfunction  Visit Diagnosis: Unsteadiness on feet  Ataxic gait  Foot drop, left  Muscle weakness (generalized)     Problem List Patient Active Problem List   Diagnosis Date Noted  . Ventricular premature beats 10/31/2015  . Heart murmur 10/31/2015    Percival Spanish, PT, MPT 04/24/2017, 4:07 PM  [facility] [address]  [place], [place], [address] Phone: [phone]   Fax:  [phone]  Name: [name] Trujillo MRN: [id number] Date of Birth: [birth year]

## 2017-04-28 ENCOUNTER — Ambulatory Visit: Payer: Medicare Other | Admitting: Physical Therapy

## 2017-04-29 ENCOUNTER — Ambulatory Visit: Payer: Medicare Other | Admitting: Physical Therapy

## 2017-04-29 DIAGNOSIS — R2681 Unsteadiness on feet: Secondary | ICD-10-CM

## 2017-04-29 DIAGNOSIS — M21372 Foot drop, left foot: Secondary | ICD-10-CM

## 2017-04-29 DIAGNOSIS — R260 Ataxic gait: Secondary | ICD-10-CM

## 2017-04-29 DIAGNOSIS — M6281 Muscle weakness (generalized): Secondary | ICD-10-CM

## 2017-04-29 NOTE — Therapy (Signed)
[facility] [address]  [place], [place], [address] Phone: [phone]   Fax:  [phone]  Physical Therapy Treatment  Patient Details  Name: [name] Trujillo MRN: [id number] Date of Birth: 1940-10-10 Referring Provider: Sarina Ill, MD & Mechele Claude, PA-C  Encounter Date: 04/29/2017      PT End of Session - 04/29/17 1447    Visit Number 26   Number of Visits 28   Date for PT Re-Evaluation 05/02/17   Authorization Type UHC Medicare - [place]: [place]   PT Start Time 6962   PT Stop Time 1533   PT Time Calculation (min) 46 min   Activity Tolerance Patient tolerated treatment well   Behavior During Therapy [facility] for tasks assessed/performed      Past Medical History:  Diagnosis Date  . High cholesterol   . Hypothyroidism   . Neuropathy     Past Surgical History:  Procedure Laterality Date  . APPENDECTOMY     2-20 yo  . BACK SURGERY     Age 75  . CESAREAN SECTION    . ECTOPIC PREGNANCY SURGERY    . LAPAROSCOPIC HYSTERECTOMY      There were no vitals filed for this visit.      Subjective Assessment - 04/29/17 1449    Subjective Pt feeling like she is continuing to get stronger but her balance is slower to improve.   Pertinent History Remote h/o surgery on L sciatic nerve; h/o lumbar HNP; pes planus   Patient Stated Goals "to get my balance back so I can feel safer in the yard."   Currently in Pain? No/denies            [facility] PT Assessment - 04/29/17 1447      Standardized Balance Assessment   Standardized Balance Assessment Berg Balance Test     Berg Balance Test   Sit to Stand Able to stand without using hands and stabilize independently   Standing Unsupported Able to stand safely 2 minutes   Sitting with Back Unsupported but Feet Supported on Floor or Stool Able to sit safely and securely 2 minutes   Stand to Sit Sits safely with minimal use of hands   Transfers Able to transfer safely, minor  use of hands   Standing Unsupported with Eyes Closed Able to stand 10 seconds safely   Standing Ubsupported with Feet Together Able to place feet together independently and stand 1 minute safely   From Standing, Reach Forward with Outstretched Arm Can reach confidently >25 cm (10")   From Standing Position, Pick up Object from Floor Able to pick up shoe safely and easily   From Standing Position, Turn to Look Behind Over each Shoulder Looks behind from both sides and weight shifts well   Turn 360 Degrees Able to turn 360 degrees safely in 4 seconds or less   Standing Unsupported, Alternately Place Feet on Step/Stool Able to stand independently and safely and complete 8 steps in 20 seconds   Standing Unsupported, One Foot in Front Able to plae foot ahead of the other independently and hold 30 seconds   Standing on One Leg Tries to lift leg/unable to hold 3 seconds but remains standing independently   Total Score 52                     [facility] Adult PT Treatment/Exercise - 04/29/17 1447      Neuro Re-ed    Neuro Re-ed  Details  educated pt on progression of corner static balance exercises for HEP     Knee/Hip Exercises: Aerobic   Recumbent Bike lvl 3 x 7'     Knee/Hip Exercises: Standing   Functional Squat 10 reps;3 seconds   Functional Squat Limitations slight hip touch to Airex pad on chair             Balance Exercises - 04/29/17 1447      Balance Exercises: Standing   Standing Eyes Opened Narrow base of support (BOS);Solid surface;Foam/compliant surface;30 secs;2 reps;Head turns  Rhomberg stance   Standing Eyes Closed Narrow base of support (BOS);Solid surface;Foam/compliant surface;30 secs;2 reps;Head turns  Rhomberg stance   Tandem Stance Eyes open;Foam/compliant surface;30 secs;2 reps;Intermittent upper extremity support   Partial Tandem Stance Eyes open;Foam/compliant surface;30 secs;2 reps;Intermittent upper extremity support           PT Education -  04/29/17 1533    Education provided Yes   Education Details corner static balance progression   Person(s) Educated Patient   Methods Explanation;Demonstration;Handout   Comprehension Verbalized understanding;Returned demonstration          PT Short Term Goals - 01/23/17 1648      PT SHORT TERM GOAL #1   Title Independent with initial HEP by 01/24/17   Status Achieved     PT SHORT TERM GOAL #2   Title Berg >/= 42 to reduce risk for falls by 01/24/17   Status Achieved           PT Long Term Goals - 03/13/17 1359      PT LONG TERM GOAL #1   Title Independent with advanced HEP by 05/02/17   Status Partially Met  met for current HEP     PT LONG TERM GOAL #2   Title B LE strength >/= 4/5 for improved gait stability by 05/02/17   Status Partially Met     PT LONG TERM GOAL #3   Title Berg >/= 48/56 to reduce risk for falls by 03/14/17   Status Achieved     PT LONG TERM GOAL #4   Title DGI >/= 20/24 to improve gait safety and stability by 03/14/17   Status Achieved     PT LONG TERM GOAL #5   Title Pt will ambulate with normal gait pattern w/o evidence of L foot drop/slap by 05/02/17   Status On-going     PT LONG TERM GOAL #6   Title Pt will perform sit <> stand from low seat height (14-15") w/o need for UE assist to allow increased ease of public toilet transfers by 05/02/17   Status New     PT LONG TERM GOAL #7   Title Pt will ascend/descend stairs with reciprocal pattern w/o railing or UE support to increase safety with community mobility by 05/02/17   Status New               Plan - 04/29/17 1452    Clinical Impression Statement [name] Trujillo continues to report benefit from PT, feeling stronger with better ankle control and decreased foot slap during gait. Remains limited with static and dynamic standing balance, although dramatically better than when she started PT. Pt nearing end of current POC and would like to extend PT to continue focus on balance training for  improved stability and decreased fall risk, therefore will plan for reassessment with possible recert at next visit.   Rehab Potential Good   Clinical Impairments Affecting Rehab Potential h/o scoliosis, DDD, neuropathy, remote  h/o back surgery    PT Frequency 2x / week   PT Duration 4 weeks  POC adjusted to accomodate pt's travel plans   PT Treatment/Interventions Patient/family education;Neuromuscular re-education;Balance training;Therapeutic exercise;Therapeutic activities;Functional mobility training;Gait training;Stair training;Manual techniques;Taping;Dry needling;Electrical Stimulation;ADLs/Self Care Home Management   PT Next Visit Plan Probable recert with emphasis on balance, proprioceptive & coordination training; core & LE strengthening with emphasis on hip and ankles as indicated; L foot intrinsic muscle re-education/strengthening   Consulted and Agree with Plan of Care Patient      Patient will benefit from skilled therapeutic intervention in order to improve the following deficits and impairments:  Decreased strength, Decreased balance, Decreased coordination, Impaired sensation, Abnormal gait, Difficulty walking, Decreased activity tolerance, Postural dysfunction  Visit Diagnosis: Unsteadiness on feet  Ataxic gait  Foot drop, left  Muscle weakness (generalized)     Problem List Patient Active Problem List   Diagnosis Date Noted  . Ventricular premature beats 10/31/2015  . Heart murmur 10/31/2015    Percival Spanish, PT, MPT 04/29/2017, 4:05 PM  [facility] [address]  [place], [place], [address] Phone: [phone]   Fax:  [phone]  Name: [name] Trujillo MRN: [id number] Date of Birth: 1941-05-27

## 2017-05-01 ENCOUNTER — Ambulatory Visit: Payer: Medicare Other | Admitting: Physical Therapy

## 2017-05-01 DIAGNOSIS — M21372 Foot drop, left foot: Secondary | ICD-10-CM

## 2017-05-01 DIAGNOSIS — R260 Ataxic gait: Secondary | ICD-10-CM

## 2017-05-01 DIAGNOSIS — M6281 Muscle weakness (generalized): Secondary | ICD-10-CM

## 2017-05-01 DIAGNOSIS — R2681 Unsteadiness on feet: Secondary | ICD-10-CM

## 2017-05-01 NOTE — Therapy (Signed)
[facility] [address]  [address] [place], [place], [address] Phone: [phone]   Fax:  [phone]  Physical Therapy Treatment  Patient Details  Name: [name] Trujillo MRN: [id number] Date of Birth: Mar 19, 1941 Referring Provider: Naomie Dean, MD  Encounter Date: 05/01/2017      PT End of Session - 05/01/17 1449    Visit Number 27   Number of Visits 35   Date for PT Re-Evaluation 07/04/17   Authorization Type UHC Medicare - [place]: [place]   PT Start Time 1449   PT Stop Time 1533   PT Time Calculation (min) 44 min   Activity Tolerance Patient tolerated treatment well   Behavior During Therapy [facility] for tasks assessed/performed      Past Medical History:  Diagnosis Date  . High cholesterol   . Hypothyroidism   . Neuropathy     Past Surgical History:  Procedure Laterality Date  . APPENDECTOMY     44-20 yo  . BACK SURGERY     Age 48  . CESAREAN SECTION    . ECTOPIC PREGNANCY SURGERY    . LAPAROSCOPIC HYSTERECTOMY      There were no vitals filed for this visit.      Subjective Assessment - 05/01/17 1458    Subjective Pt "doing great" today.   Pertinent History Remote h/o surgery on L sciatic nerve; h/o lumbar HNP; pes planus   Patient Stated Goals "to get my balance back so I can feel safer in the yard."   Currently in Pain? No/denies   Pain Score 0-No pain            [facility] PT Assessment - 05/01/17 1449      Assessment   Medical Diagnosis Abnormality of gait; L foot drop; Peripheral neuropathy   Referring Provider Naomie Dean, MD   Next MD Visit none scheduled     Strength   Right Hip Flexion 4+/5   Right Hip Extension 4/5   Right Hip External Rotation  4/5   Right Hip Internal Rotation 4/5   Right Hip ABduction 4/5   Right Hip ADduction 4-/5   Left Hip Flexion 4+/5   Left Hip Extension 4/5   Left Hip External Rotation 4/5   Left Hip Internal Rotation 4/5   Left Hip ABduction 4-/5   Left Hip  ADduction 4-/5   Right Knee Flexion 5/5   Right Knee Extension 5/5   Left Knee Flexion 5/5   Left Knee Extension 5/5   Right Ankle Dorsiflexion 4/5   Right Ankle Plantar Flexion 4/5   Right Ankle Inversion 4/5   Right Ankle Eversion 4+/5   Left Ankle Dorsiflexion 4-/5   Left Ankle Plantar Flexion 3+/5   Left Ankle Inversion 4-/5   Left Ankle Eversion 4/5     Ambulation/Gait   Gait Pattern Step-through pattern;Trunk flexed   Gait velocity 3.33 ft/sec   Stairs Assistance 5: Supervision;6: Modified independent (Device/Increase time)   Stair Management Technique Alternating pattern;Forwards  no rail on ascent, intermittent rail use on descent   Number of Stairs 13   Height of Stairs 7     Standardized Balance Assessment   Standardized Balance Assessment Dynamic Gait Index;10 meter walk test   10 Meter Walk 9.84"     Dynamic Gait Index   Level Surface Normal   Change in Gait Speed Normal   Gait with Horizontal Head Turns Normal   Gait with Vertical Head Turns Normal   Gait  and Pivot Turn Normal   Step Over Obstacle Normal   Step Around Obstacles Normal   Steps Mild Impairment   Total Score 23     Functional Gait  Assessment   Gait assessed  Yes   Gait Level Surface Walks 20 ft in less than 7 sec but greater than 5.5 sec, uses assistive device, slower speed, mild gait deviations, or deviates 6-10 in outside of the 12 in walkway width.   Change in Gait Speed Able to smoothly change walking speed without loss of balance or gait deviation. Deviate no more than 6 in outside of the 12 in walkway width.   Gait with Horizontal Head Turns Performs head turns smoothly with no change in gait. Deviates no more than 6 in outside 12 in walkway width   Gait with Vertical Head Turns Performs task with slight change in gait velocity (eg, minor disruption to smooth gait path), deviates 6 - 10 in outside 12 in walkway width or uses assistive device   Gait and Pivot Turn Pivot turns safely within  3 sec and stops quickly with no loss of balance.   Step Over Obstacle Is able to step over 2 stacked shoe boxes taped together (9 in total height) without changing gait speed. No evidence of imbalance.   Gait with Narrow Base of Support Ambulates less than 4 steps heel to toe or cannot perform without assistance.   Gait with Eyes Closed Cannot walk 20 ft without assistance, severe gait deviations or imbalance, deviates greater than 15 in outside 12 in walkway width or will not attempt task.   Ambulating Backwards Walks 20 ft, slow speed, abnormal gait pattern, evidence for imbalance, deviates 10-15 in outside 12 in walkway width.   Steps Alternating feet, must use rail.   Total Score 19   FGA comment: 19-24 = medium risk fall                     [facility] Adult PT Treatment/Exercise - 05/01/17 1449      Knee/Hip Exercises: Aerobic   Recumbent Bike lvl 3 x 7'                  PT Short Term Goals - 01/23/17 1648      PT SHORT TERM GOAL #1   Title Independent with initial HEP by 01/24/17   Status Achieved     PT SHORT TERM GOAL #2   Title Berg >/= 42 to reduce risk for falls by 01/24/17   Status Achieved           PT Long Term Goals - 05/01/17 1526      PT LONG TERM GOAL #1   Title Independent with advanced HEP    Status Partially Met  met for current HEP   Target Date 07/04/17     PT LONG TERM GOAL #2   Title B LE strength >/= 4/5 for improved gait stability   Status Partially Met  met except B hip adduction & L ankle DF/PF/INV   Target Date 07/04/17     PT LONG TERM GOAL #3   Title Berg >/= 48/56 to reduce risk for falls by 03/14/17   Status Achieved     PT LONG TERM GOAL #4   Title DGI >/= 20/24 to improve gait safety and stability by 03/14/17   Status Achieved     PT LONG TERM GOAL #5   Title Pt will ambulate with normal gait pattern w/o evidence  of L foot drop/slap    Status Partially Met  pt able to ambulate with good heel strike and heel-toe  progression with conscious effort but L foot slap becomes more apparent during dynamic tasks or with fatigue   Target Date 07/04/17     PT LONG TERM GOAL #6   Title Pt will perform sit <> stand from low seat height (14-15") w/o need for UE assist to allow increased ease of public toilet transfers    Status On-going   Target Date 07/04/17     PT LONG TERM GOAL #7   Title Pt will ascend/descend stairs with reciprocal pattern w/o railing or UE support to increase safety with community mobility    Status Partially Met  able to ascend/descend reciprocally but continued intermittent use of railing necessary   Target Date 07/04/17     PT LONG TERM GOAL #8   Title FGA >/= 25/30 to reduce fall risk to low   Status New   Target Date 07/04/17               Plan - 05/01/17 1533    Clinical Impression Statement Caylah continues to demonstrate consistent progress with PT and she is very pleased with her improvement but still feels as if she is lacking with higher level static balance (tandem stance & SLS) and dynamic balance. Good gains made with B LE strength although fatiue still plays a factor with some motions, esp L foot F and inversion. Balance has also signifcantly improved with pt meeting or exceeding goals for Berg & DGI, however FSA still indicating a moderate risk for falls. Pt wishing to continue with PT for further focus on balance activities and given progress to date and good/excellent potential for further improvement, recommend recert for addtional 8 wks with frequency reduced to 1x/wk.   Rehab Potential Good   Clinical Impairments Affecting Rehab Potential h/o scoliosis, DDD, neuropathy, remote h/o back surgery    PT Frequency 1x / week   PT Duration 8 weeks  POC adjusted to accomodate pt's travel plans   PT Treatment/Interventions Patient/family education;Neuromuscular re-education;Balance training;Therapeutic exercise;Therapeutic activities;Functional mobility training;Gait  training;Stair training;Manual techniques;Taping;Dry needling;Electrical Stimulation;ADLs/Self Care Home Management   PT Next Visit Plan balance, proprioceptive & coordination training; gait training to normalize gait pattern + dynamic gait activities; core & LE strengthening with emphasis on hip and ankles as indicated to facilitate balance; L foot intrinsic muscle re-education/strengthening   Consulted and Agree with Plan of Care Patient      Patient will benefit from skilled therapeutic intervention in order to improve the following deficits and impairments:  Decreased strength, Decreased balance, Decreased coordination, Impaired sensation, Abnormal gait, Difficulty walking, Decreased activity tolerance, Postural dysfunction  Visit Diagnosis: Unsteadiness on feet  Ataxic gait  Foot drop, left  Muscle weakness (generalized)     Problem List Patient Active Problem List   Diagnosis Date Noted  . Ventricular premature beats 10/31/2015  . Heart murmur 10/31/2015    Percival Spanish, PT, MPT 05/01/2017, 7:33 PM  [facility] [address]  [place], [place], [address] Phone: [phone]   Fax:  [phone]  Name: Kashina Mecum MRN: [id number] Date of Birth: 06-Feb-1941

## 2017-05-06 ENCOUNTER — Ambulatory Visit: Payer: Medicare Other | Admitting: Physical Therapy

## 2017-05-06 DIAGNOSIS — R2681 Unsteadiness on feet: Secondary | ICD-10-CM

## 2017-05-06 DIAGNOSIS — M21372 Foot drop, left foot: Secondary | ICD-10-CM

## 2017-05-06 DIAGNOSIS — R260 Ataxic gait: Secondary | ICD-10-CM

## 2017-05-06 DIAGNOSIS — M6281 Muscle weakness (generalized): Secondary | ICD-10-CM

## 2017-05-06 NOTE — Therapy (Signed)
[facility] [address]  [place], [place], [address] Phone: [phone]   Fax:  [phone]  Physical Therapy Treatment  Patient Details  Name: Asa Fath MRN: [id number] Date of Birth: 03-28-1941 Referring Provider: Sarina Ill, MD  Encounter Date: 05/06/2017      PT End of Session - 05/06/17 0930    Visit Number 28   Number of Visits 35   Date for PT Re-Evaluation 07/04/17   Authorization Type UHC Medicare [place]: [place]   PT Start Time 0930   PT Stop Time 1011   PT Time Calculation (min) 41 min   Activity Tolerance Patient tolerated treatment well   Behavior During Therapy [facility] for tasks assessed/performed      Past Medical History:  Diagnosis Date  . High cholesterol   . Hypothyroidism   . Neuropathy     Past Surgical History:  Procedure Laterality Date  . APPENDECTOMY     42-18 yo  . BACK SURGERY     Age 56  . CESAREAN SECTION    . ECTOPIC PREGNANCY SURGERY    . LAPAROSCOPIC HYSTERECTOMY      There were no vitals filed for this visit.      Subjective Assessment - 05/06/17 0932    Subjective Pt noting more difficulty than she expected with walking backward or with eyes closed.   Pertinent History Remote h/o surgery on L sciatic nerve; h/o lumbar HNP; pes planus   Patient Stated Goals "to get my balance back so I can feel safer in the yard."                         [facility] Adult PT Treatment/Exercise - 05/06/17 0930      High Level Balance   High Level Balance Activities Backward walking  hand on wall for support     Self-Care   Self-Care Posture   Posture Focused on activities to promote upright posture with gait, including positional stretches over pool noodle and active extension with scap retraction against door frame.     Neuro Re-ed    Neuro Re-ed Details  PWR! Up flow (prone, quadruped, standing) focusing on trunk extensionf for improved upright posture     Knee/Hip Exercises: Aerobic   Recumbent Bike lvl 3 x 7'     Knee/Hip Exercises: Standing   Other Standing Knee Exercises Standing PWR! step fwd & back - initially at counter then progressing to TRX                  PT Short Term Goals - 01/23/17 1648      PT SHORT TERM GOAL #1   Title Independent with initial HEP by 01/24/17   Status Achieved     PT SHORT TERM GOAL #2   Title Berg >/= 42 to reduce risk for falls by 01/24/17   Status Achieved           PT Long Term Goals - 05/01/17 1526      PT LONG TERM GOAL #1   Title Independent with advanced HEP    Status Partially Met  met for current HEP   Target Date 07/04/17     PT LONG TERM GOAL #2   Title B LE strength >/= 4/5 for improved gait stability   Status Partially Met  met except B hip adduction & L ankle DF/PF/INV   Target Date 07/04/17     PT  LONG TERM GOAL #3   Title Berg >/= 48/56 to reduce risk for falls by 03/14/17   Status Achieved     PT LONG TERM GOAL #4   Title DGI >/= 20/24 to improve gait safety and stability by 03/14/17   Status Achieved     PT LONG TERM GOAL #5   Title Pt will ambulate with normal gait pattern w/o evidence of L foot drop/slap    Status Partially Met  pt able to ambulate with good heel strike and heel-toe progression with conscious effort but L foot slap becomes more apparent during dynamic tasks or with fatigue   Target Date 07/04/17     PT LONG TERM GOAL #6   Title Pt will perform sit <> stand from low seat height (14-15") w/o need for UE assist to allow increased ease of public toilet transfers    Status On-going   Target Date 07/04/17     PT LONG TERM GOAL #7   Title Pt will ascend/descend stairs with reciprocal pattern w/o railing or UE support to increase safety with community mobility    Status Partially Met  able to ascend/descend reciprocally but continued intermittent use of railing necessary   Target Date 07/04/17     PT LONG TERM GOAL #8   Title FGA >/=  25/30 to reduce fall risk to low   Status New   Target Date 07/04/17               Plan - 05/06/17 1006    Clinical Impression Statement Today's treatment focusing on improved upright posture with standing an gait activities to improved balance and stability for decreased fall risk. Incorporated PWR! Up moves in various positions to promote trunk and hip extension for imporved posture as well as PWR! stepping patterns to promote carryover of upright posture and improve stability and confidence with walking backwards.   Rehab Potential Good   Clinical Impairments Affecting Rehab Potential h/o scoliosis, DDD, neuropathy, remote h/o back surgery    PT Frequency 1x / week   PT Duration 8 weeks  POC adjusted to accomodate pt's travel plans   PT Treatment/Interventions Patient/family education;Neuromuscular re-education;Balance training;Therapeutic exercise;Therapeutic activities;Functional mobility training;Gait training;Stair training;Manual techniques;Taping;Dry needling;Electrical Stimulation;ADLs/Self Care Home Management   PT Next Visit Plan review PWR! Up moves and postural activities as needed; balance, proprioceptive & coordination training; gait training to normalize gait pattern + dynamic gait activities; core & LE strengthening with emphasis on hip and ankles as indicated to facilitate balance; L foot intrinsic muscle re-education/strengthening   Consulted and Agree with Plan of Care Patient      Patient will benefit from skilled therapeutic intervention in order to improve the following deficits and impairments:  Decreased strength, Decreased balance, Decreased coordination, Impaired sensation, Abnormal gait, Difficulty walking, Decreased activity tolerance, Postural dysfunction  Visit Diagnosis: Unsteadiness on feet  Ataxic gait  Foot drop, left  Muscle weakness (generalized)     Problem List Patient Active Problem List   Diagnosis Date Noted  . Ventricular  premature beats 10/31/2015  . Heart murmur 10/31/2015    Percival Spanish, PT, MPT 05/06/2017, 11:00 AM  [facility] [place] [address]  [place], [place], [address] Phone: [phone]   Fax:  [phone]  Name: Kjerstin Abrigo MRN: [id number] Date of Birth: 17-Feb-1941

## 2017-05-13 ENCOUNTER — Ambulatory Visit: Payer: Medicare Other | Attending: Neurology | Admitting: Physical Therapy

## 2017-05-13 DIAGNOSIS — M21372 Foot drop, left foot: Secondary | ICD-10-CM | POA: Diagnosis present

## 2017-05-13 DIAGNOSIS — R2681 Unsteadiness on feet: Secondary | ICD-10-CM | POA: Insufficient documentation

## 2017-05-13 DIAGNOSIS — M6281 Muscle weakness (generalized): Secondary | ICD-10-CM | POA: Insufficient documentation

## 2017-05-13 DIAGNOSIS — R260 Ataxic gait: Secondary | ICD-10-CM | POA: Diagnosis present

## 2017-05-13 NOTE — Therapy (Signed)
[facility] [address]  [place], [place], [address] Phone: [phone]   Fax:  [phone]  Physical Therapy Treatment  Patient Details  Name: [name] Trujillo MRN: [id number] Date of Birth: [birth year] Referring Provider: Sarina Ill, MD  Encounter Date: 05/13/2017      PT End of Session - 05/13/17 1443    Visit Number 29   Number of Visits 35   Date for PT Re-Evaluation 07/04/17   Authorization Type UHC Medicare [place]: [place]   PT Start Time 8592   PT Stop Time 1525   PT Time Calculation (min) 44 min   Activity Tolerance Patient tolerated treatment well   Behavior During Therapy [facility] for tasks assessed/performed      Past Medical History:  Diagnosis Date  . High cholesterol   . Hypothyroidism   . Neuropathy     Past Surgical History:  Procedure Laterality Date  . APPENDECTOMY     39-74 yo  . BACK SURGERY     Age 76  . CESAREAN SECTION    . ECTOPIC PREGNANCY SURGERY    . LAPAROSCOPIC HYSTERECTOMY      There were no vitals filed for this visit.      Subjective Assessment - 05/13/17 1446    Subjective Pt reporting worsening of her numbness with attempts quadruped PWR! Up at home, esp if she tries to sit back toward her heels.   Pertinent History Remote h/o surgery on L sciatic nerve; h/o lumbar HNP; pes planus   Patient Stated Goals "to get my balance back so I can feel safer in the yard."   Currently in Pain? No/denies   Pain Score 0-No pain                         [facility] Adult PT Treatment/Exercise - 05/13/17 1441      High Level Balance   High Level Balance Activities Side stepping;Negotiating over obstacles;Braiding;Tandem walking     Neuro Re-ed    Neuro Re-ed Details  PWR! Up quadruped reviewed d/t c/o of worsening numbness when performing at home - cues to kneel on cushion avoiding extreme PF at ankles as well as cues to limit backward rock before rising into tall kneeling -  pt reporting this resolved the issues she was havin at home     Knee/Hip Exercises: Aerobic   Recumbent Bike lvl 3 x 7'     Knee/Hip Exercises: Standing   Step Down Both;20 reps;Step Height: 6";Hand Hold: 2   Step Down Limitations lateral & fwd eccentric lowering with heel touch each   SLS with Vectors B SLS clocks x10 on blue foam oval, 1 pole A                PT Education - 05/13/17 1525    Education provided Yes   Education Details HEP additions - braiding for dynamic gait & eccentric step-downs for improved control on stair descent   Person(s) Educated Patient   Methods Explanation;Demonstration;Handout   Comprehension Verbalized understanding;Returned demonstration          PT Short Term Goals - 01/23/17 1648      PT SHORT TERM GOAL #1   Title Independent with initial HEP by 01/24/17   Status Achieved     PT SHORT TERM GOAL #2   Title Berg >/= 42 to reduce risk for falls by 01/24/17   Status Achieved  PT Long Term Goals - 05/01/17 1526      PT LONG TERM GOAL #1   Title Independent with advanced HEP    Status Partially Met  met for current HEP   Target Date 07/04/17     PT LONG TERM GOAL #2   Title B LE strength >/= 4/5 for improved gait stability   Status Partially Met  met except B hip adduction & L ankle DF/PF/INV   Target Date 07/04/17     PT LONG TERM GOAL #3   Title Berg >/= 48/56 to reduce risk for falls by 03/14/17   Status Achieved     PT LONG TERM GOAL #4   Title DGI >/= 20/24 to improve gait safety and stability by 03/14/17   Status Achieved     PT LONG TERM GOAL #5   Title Pt will ambulate with normal gait pattern w/o evidence of L foot drop/slap    Status Partially Met  pt able to ambulate with good heel strike and heel-toe progression with conscious effort but L foot slap becomes more apparent during dynamic tasks or with fatigue   Target Date 07/04/17     PT LONG TERM GOAL #6   Title Pt will perform sit <> stand from low  seat height (14-15") w/o need for UE assist to allow increased ease of public toilet transfers    Status On-going   Target Date 07/04/17     PT LONG TERM GOAL #7   Title Pt will ascend/descend stairs with reciprocal pattern w/o railing or UE support to increase safety with community mobility    Status Partially Met  able to ascend/descend reciprocally but continued intermittent use of railing necessary   Target Date 07/04/17     PT LONG TERM GOAL #8   Title FGA >/= 25/30 to reduce fall risk to low   Status New   Target Date 07/04/17               Plan - 05/13/17 1441    Clinical Impression Statement Pt expressing some concerns with latest HEP addtion of PWR! Up moves for improved posture, in particular reporting increased numbness with quadruped PWR! Up attempts. Exercise reviewed with slight modifications made and pt able to complete exercise w/o any further issues. While discussing HEP activities, pt requesting HEP exercise to promote increased eas of stair descent, therefore added eccentric step downs to HEP. Remainder of visit focused on dynamic gait activities with pt requiring less UE supoort today.   Rehab Potential Good   Clinical Impairments Affecting Rehab Potential h/o scoliosis, DDD, neuropathy, remote h/o back surgery    PT Treatment/Interventions Patient/family education;Neuromuscular re-education;Balance training;Therapeutic exercise;Therapeutic activities;Functional mobility training;Gait training;Stair training;Manual techniques;Taping;Dry needling;Electrical Stimulation;ADLs/Self Care Home Management   PT Next Visit Plan balance, proprioceptive & coordination training; gait training to normalize gait pattern + dynamic gait activities; core & LE strengthening with emphasis on hip and ankles as indicated to facilitate balance; L foot intrinsic muscle re-education/strengthening   Consulted and Agree with Plan of Care Patient      Patient will benefit from skilled  therapeutic intervention in order to improve the following deficits and impairments:  Decreased strength, Decreased balance, Decreased coordination, Impaired sensation, Abnormal gait, Difficulty walking, Decreased activity tolerance, Postural dysfunction  Visit Diagnosis: Unsteadiness on feet  Ataxic gait  Foot drop, left  Muscle weakness (generalized)     Problem List Patient Active Problem List   Diagnosis Date Noted  . Ventricular premature beats 10/31/2015  .  Heart murmur 10/31/2015    Percival Spanish, PT, MPT 05/13/2017, 6:43 PM  [facility] [address]  [place], [place], [address] Phone: [phone]   Fax:  [phone]  Name: Adreonna Yontz MRN: [id number] Date of Birth: January 02, 1941

## 2017-05-20 ENCOUNTER — Ambulatory Visit: Payer: Medicare Other | Admitting: Physical Therapy

## 2017-05-20 DIAGNOSIS — R260 Ataxic gait: Secondary | ICD-10-CM

## 2017-05-20 DIAGNOSIS — M21372 Foot drop, left foot: Secondary | ICD-10-CM

## 2017-05-20 DIAGNOSIS — R2681 Unsteadiness on feet: Secondary | ICD-10-CM

## 2017-05-20 DIAGNOSIS — M6281 Muscle weakness (generalized): Secondary | ICD-10-CM

## 2017-05-20 NOTE — Patient Instructions (Signed)

## 2017-05-20 NOTE — Therapy (Signed)
[facility] [address]  [place], [place], [address] Phone: [phone]   Fax:  [phone]  Physical Therapy Treatment  Patient Details  Name: [name] Trujillo MRN: [id number] Date of Birth: 1941/04/03 Referring Provider: Sarina Ill, MD  Encounter Date: 05/20/2017      PT End of Session - 05/20/17 1446    Visit Number 30   Number of Visits 35   Date for PT Re-Evaluation 07/04/17   Authorization Type UHC Medicare [place]   PT Start Time 1446   PT Stop Time 1535   PT Time Calculation (min) 49 min   Activity Tolerance Patient tolerated treatment well   Behavior During Therapy [facility] for tasks assessed/performed      Past Medical History:  Diagnosis Date  . High cholesterol   . Hypothyroidism   . Neuropathy     Past Surgical History:  Procedure Laterality Date  . APPENDECTOMY     72-18 yo  . BACK SURGERY     Age 57  . CESAREAN SECTION    . ECTOPIC PREGNANCY SURGERY    . LAPAROSCOPIC HYSTERECTOMY      There were no vitals filed for this visit.      Subjective Assessment - 05/20/17 1452    Subjective Pt reporting numbness back to baseline from flare-up last visit.   Pertinent History Remote h/o surgery on L sciatic nerve; h/o lumbar HNP; pes planus   Patient Stated Goals "to get my balance back so I can feel safer in the yard."   Currently in Pain? No/denies            [facility] PT Assessment - 05/20/17 1446      Assessment   Medical Diagnosis Abnormality of gait; L foot drop; Peripheral neuropathy   Referring Provider Sarina Ill, MD   Next MD Visit none scheduled     Observation/Other Assessments   Focus on Therapeutic Outcomes (FOTO)  Foot - 62% (38% limitation                     [facility] Adult PT Treatment/Exercise - 05/20/17 1446      Knee/Hip Exercises: Aerobic   Recumbent Bike lvl 3 x 7'     Manual Therapy   Manual Therapy Soft tissue mobilization;Myofascial release    Soft tissue mobilization L peroneus/fibularis muscles   Myofascial Release L fibularis brevis     Ankle Exercises: Seated   Ankle Circles/Pumps Left;15 reps   Other Seated Ankle Exercises L ankle - yellow band resisted 4 way ankle x10 each   Other Seated Ankle Exercises figure 4 ankle inversion with yellow TB x10          Trigger Point Dry Needling - 05/20/17 1446    Consent Given? Yes   Education Handout Provided Yes   Muscles Treated Lower Body --  fibular longus & brevis   Tibialis Anterior Response Twitch response elicited;Palpable increased muscle length  fibular longus & brevis              PT Education - 05/20/17 1510    Education provided Yes   Education Details Role of DN, expected response and post-treatment recommendations   Person(s) Educated Patient   Methods Explanation;Demonstration   Comprehension Verbalized understanding          PT Short Term Goals - 01/23/17 1648      PT SHORT TERM GOAL #1   Title Independent with initial HEP by  01/24/17   Status Achieved     PT SHORT TERM GOAL #2   Title Berg >/= 42 to reduce risk for falls by 01/24/17   Status Achieved           PT Long Term Goals - 05/01/17 1526      PT LONG TERM GOAL #1   Title Independent with advanced HEP    Status Partially Met  met for current HEP   Target Date 07/04/17     PT LONG TERM GOAL #2   Title B LE strength >/= 4/5 for improved gait stability   Status Partially Met  met except B hip adduction & L ankle DF/PF/INV   Target Date 07/04/17     PT LONG TERM GOAL #3   Title Berg >/= 48/56 to reduce risk for falls by 03/14/17   Status Achieved     PT LONG TERM GOAL #4   Title DGI >/= 20/24 to improve gait safety and stability by 03/14/17   Status Achieved     PT LONG TERM GOAL #5   Title Pt will ambulate with normal gait pattern w/o evidence of L foot drop/slap    Status Partially Met  pt able to ambulate with good heel strike and heel-toe progression with  conscious effort but L foot slap becomes more apparent during dynamic tasks or with fatigue   Target Date 07/04/17     PT LONG TERM GOAL #6   Title Pt will perform sit <> stand from low seat height (14-15") w/o need for UE assist to allow increased ease of public toilet transfers    Status On-going   Target Date 07/04/17     PT LONG TERM GOAL #7   Title Pt will ascend/descend stairs with reciprocal pattern w/o railing or UE support to increase safety with community mobility    Status Partially Met  able to ascend/descend reciprocally but continued intermittent use of railing necessary   Target Date 07/04/17     PT LONG TERM GOAL #8   Title FGA >/= 25/30 to reduce fall risk to low   Status New   Target Date 07/04/17               Plan - 05/20/17 1454    Clinical Impression Statement Natacha continues to note gains with PT and reports her family is commenting on her improvement as well - noting improving posture, balance and gait pattern with improving L ankle control esp inversion ROM with resistance. Pt still c/o restriction of fluid motion in L ankle with some motions such as ankle circles due to preceived tightness in lateral distal LE muscles, with increased tightness and muscle tension/ttp noted in L fibularis longus and brevis. Educated pt on possible use of DN to address increased muscle tension and after verbal pt consent received, performed DN to these muscles with positive twitch response and decreased muscle tension as well as increased easeof inversion ROM noted following treatment.   Rehab Potential Good   Clinical Impairments Affecting Rehab Potential h/o scoliosis, DDD, neuropathy, remote h/o back surgery    PT Treatment/Interventions Patient/family education;Neuromuscular re-education;Balance training;Therapeutic exercise;Therapeutic activities;Functional mobility training;Gait training;Stair training;Manual techniques;Taping;Dry needling;Electrical Stimulation;ADLs/Self  Care Home Management   PT Next Visit Plan assess response to DN; balance, proprioceptive & coordination training; gait training to normalize gait pattern + dynamic gait activities; core & LE strengthening with emphasis on hip and ankles as indicated to facilitate balance; L foot intrinsic muscle re-education/strengthening   Consulted and  Agree with Plan of Care Patient      Patient will benefit from skilled therapeutic intervention in order to improve the following deficits and impairments:  Decreased strength, Decreased balance, Decreased coordination, Impaired sensation, Abnormal gait, Difficulty walking, Decreased activity tolerance, Postural dysfunction  Visit Diagnosis: Unsteadiness on feet  Ataxic gait  Foot drop, left  Muscle weakness (generalized)       G-Codes - 05/20/17 1530    Functional Assessment Tool Used (Outpatient Only) Foot FOTO = 62% (38% limitation)   Functional Limitation Mobility: Walking and moving around   Mobility: Walking and Moving Around Current Status (G8978) At least 20 percent but less than 40 percent impaired, limited or restricted   Mobility: Walking and Moving Around Goal Status (G8979) At least 20 percent but less than 40 percent impaired, limited or restricted      Problem List Patient Active Problem List   Diagnosis Date Noted  . Ventricular premature beats 10/31/2015  . Heart murmur 10/31/2015    JoAnne M Kreis, PT, MPT 05/20/2017, 6:35 PM  [facility] [address]  [address] [place], [place], [address] Phone: [phone]   Fax:  [phone]  Name: Keyoni Gilkeson MRN: [id number] Date of Birth: 11/02/1940   

## 2017-05-27 ENCOUNTER — Ambulatory Visit: Payer: Medicare Other | Admitting: Physical Therapy

## 2017-05-27 DIAGNOSIS — M21372 Foot drop, left foot: Secondary | ICD-10-CM

## 2017-05-27 DIAGNOSIS — R260 Ataxic gait: Secondary | ICD-10-CM

## 2017-05-27 DIAGNOSIS — M6281 Muscle weakness (generalized): Secondary | ICD-10-CM

## 2017-05-27 DIAGNOSIS — R2681 Unsteadiness on feet: Secondary | ICD-10-CM | POA: Diagnosis not present

## 2017-05-27 NOTE — Therapy (Signed)
[facility] [address]  [place], [place], [address] Phone: [phone]   Fax:  [phone]  Physical Therapy Treatment  Patient Details  Name: [name] Trujillo MRN: [id number] Date of Birth: 05-Nov-1940 Referring Provider: Sarina Ill, MD  Encounter Date: 05/27/2017      PT End of Session - 05/27/17 1450    Visit Number 31   Number of Visits 35   Date for PT Re-Evaluation 07/04/17   Authorization Type UHC Medicare [place]   PT Start Time 1450   PT Stop Time 1533   PT Time Calculation (min) 43 min   Activity Tolerance Patient tolerated treatment well   Behavior During Therapy [facility] for tasks assessed/performed      Past Medical History:  Diagnosis Date  . High cholesterol   . Hypothyroidism   . Neuropathy     Past Surgical History:  Procedure Laterality Date  . APPENDECTOMY     82-20 yo  . BACK SURGERY     Age 34  . CESAREAN SECTION    . ECTOPIC PREGNANCY SURGERY    . LAPAROSCOPIC HYSTERECTOMY      There were no vitals filed for this visit.      Subjective Assessment - 05/27/17 1452    Subjective Pt reports the DN "was wonderful" with "movement so much better" by Thursday & Friday last week.   Pertinent History Remote h/o surgery on L sciatic nerve; h/o lumbar HNP; pes planus   Patient Stated Goals "to get my balance back so I can feel safer in the yard."   Currently in Pain? No/denies   Pain Score 0-No pain                         [facility] Adult PT Treatment/Exercise - 05/27/17 1450      Knee/Hip Exercises: Aerobic   Recumbent Bike lvl 3 x 6''     Manual Therapy   Manual Therapy Soft tissue mobilization;Myofascial release   Soft tissue mobilization L peroneus/fibularis muscles   Myofascial Release L fibularis longus & brevis     Ankle Exercises: Seated   Other Seated Ankle Exercises L ankle - blue band for PF, red band for DF/IV/EV resisted ankle x10 each           Trigger Point Dry Needling - 05/27/17 1450    Consent Given? Yes   Muscles Treated Lower Body --  fibularis longus & brevis   Tibialis Anterior Response Twitch response elicited;Palpable increased muscle length  fibularis longus & brevis                PT Short Term Goals - 01/23/17 1648      PT SHORT TERM GOAL #1   Title Independent with initial HEP by 01/24/17   Status Achieved     PT SHORT TERM GOAL #2   Title Berg >/= 42 to reduce risk for falls by 01/24/17   Status Achieved           PT Long Term Goals - 05/01/17 1526      PT LONG TERM GOAL #1   Title Independent with advanced HEP    Status Partially Met  met for current HEP   Target Date 07/04/17     PT LONG TERM GOAL #2   Title B LE strength >/= 4/5 for improved gait stability   Status Partially Met  met except B hip adduction & L  ankle DF/PF/INV   Target Date 07/04/17     PT LONG TERM GOAL #3   Title Berg >/= 48/56 to reduce risk for falls by 03/14/17   Status Achieved     PT LONG TERM GOAL #4   Title DGI >/= 20/24 to improve gait safety and stability by 03/14/17   Status Achieved     PT LONG TERM GOAL #5   Title Pt will ambulate with normal gait pattern w/o evidence of L foot drop/slap    Status Partially Met  pt able to ambulate with good heel strike and heel-toe progression with conscious effort but L foot slap becomes more apparent during dynamic tasks or with fatigue   Target Date 07/04/17     PT LONG TERM GOAL #6   Title Pt will perform sit <> stand from low seat height (14-15") w/o need for UE assist to allow increased ease of public toilet transfers    Status On-going   Target Date 07/04/17     PT LONG TERM GOAL #7   Title Pt will ascend/descend stairs with reciprocal pattern w/o railing or UE support to increase safety with community mobility    Status Partially Met  able to ascend/descend reciprocally but continued intermittent use of railing necessary   Target Date 07/04/17     PT  LONG TERM GOAL #8   Title FGA >/= 25/30 to reduce fall risk to low   Status New   Target Date 07/04/17               Plan - 05/27/17 1455    Clinical Impression Statement Pt noting improved ROM and motor control in ankle following dry needling last session and requesting progression of theraband resistance as a result. Able to progress PF resistance to blue and remaining motions to red. Repeated DN as part of today's session with pt continuing to note benefit with improved heel strike and DF with gait.   Rehab Potential Good   Clinical Impairments Affecting Rehab Potential h/o scoliosis, DDD, neuropathy, remote h/o back surgery    PT Treatment/Interventions Patient/family education;Neuromuscular re-education;Balance training;Therapeutic exercise;Therapeutic activities;Functional mobility training;Gait training;Stair training;Manual techniques;Taping;Dry needling;Electrical Stimulation;ADLs/Self Care Home Management   PT Next Visit Plan balance, proprioceptive & coordination training; gait training to normalize gait pattern + dynamic gait activities; core & LE strengthening with emphasis on hip and ankles as indicated to facilitate balance; L foot intrinsic muscle re-education/strengthening   Consulted and Agree with Plan of Care Patient      Patient will benefit from skilled therapeutic intervention in order to improve the following deficits and impairments:  Decreased strength, Decreased balance, Decreased coordination, Impaired sensation, Abnormal gait, Difficulty walking, Decreased activity tolerance, Postural dysfunction  Visit Diagnosis: Unsteadiness on feet  Ataxic gait  Foot drop, left  Muscle weakness (generalized)     Problem List Patient Active Problem List   Diagnosis Date Noted  . Ventricular premature beats 10/31/2015  . Heart murmur 10/31/2015    Percival Spanish, PT, MPT 05/27/2017, 6:27 PM  [facility] [address]  [place], [place], [address] Phone: [phone]   Fax:  [phone]  Name: Sharyl Panchal MRN: [id number] Date of Birth: 1941-08-12

## 2017-06-03 ENCOUNTER — Ambulatory Visit: Payer: Medicare Other | Admitting: Physical Therapy

## 2017-06-03 DIAGNOSIS — R2681 Unsteadiness on feet: Secondary | ICD-10-CM | POA: Diagnosis not present

## 2017-06-03 DIAGNOSIS — M6281 Muscle weakness (generalized): Secondary | ICD-10-CM

## 2017-06-03 DIAGNOSIS — M21372 Foot drop, left foot: Secondary | ICD-10-CM

## 2017-06-03 DIAGNOSIS — R260 Ataxic gait: Secondary | ICD-10-CM

## 2017-06-03 NOTE — Therapy (Signed)
[facility] [address]  [address] [place], [place], [address] Phone: [phone]   Fax:  [phone]  Physical Therapy Treatment  Patient Details  Name: Sparkles Mcneely MRN: [id number] Date of Birth: 1940/11/27 Referring Provider: Naomie Dean, MD  Encounter Date: 06/03/2017      PT End of Session - 06/03/17 1444    Visit Number 32   Number of Visits 35   Date for PT Re-Evaluation 07/04/17   Authorization Type UHC Medicare - [place]: [place]   PT Start Time 1444   PT Stop Time 1529   PT Time Calculation (min) 45 min   Activity Tolerance Patient tolerated treatment well   Behavior During Therapy [facility] for tasks assessed/performed      Past Medical History:  Diagnosis Date  . High cholesterol   . Hypothyroidism   . Neuropathy     Past Surgical History:  Procedure Laterality Date  . APPENDECTOMY     74-20 yo  . BACK SURGERY     Age 76  . CESAREAN SECTION    . ECTOPIC PREGNANCY SURGERY    . LAPAROSCOPIC HYSTERECTOMY      There were no vitals filed for this visit.      Subjective Assessment - 06/03/17 1446    Subjective Pt continues to report benefit from DN with less numbness and gait feeling "normal for several days" following DN. States able to work in the yard alll morning with less fatigue noted and improved stability on uneven ground.   Pertinent History Remote h/o surgery on L sciatic nerve; h/o lumbar HNP; pes planus   Patient Stated Goals "to get my balance back so I can feel safer in the yard."   Currently in Pain? No/denies   Pain Score 0-No pain                         [facility] Adult PT Treatment/Exercise - 06/03/17 1444      Knee/Hip Exercises: Aerobic   Recumbent Bike lvl 3 x 6.5''     Knee/Hip Exercises: Standing   Functional Squat 10 reps;3 seconds;2 sets   Functional Squat Limitations squat + heel riase upon rising -1st set at counter, 2nd set on TRX     Ankle Exercises: Seated   Other Seated Ankle Exercises L ankle inversion with red TB x15 - reviewed band tether using door     Ankle Exercises: Standing   Heel Raises 10 reps;3 seconds  2 sets   Heel Raises Limitations B con / L ecc - 1st set on floor, 2nd set on 2" rise (back of UBE)   Heel Walk (Round Trip) 20 ft along counter, intermittent UE support   Toe Walk (Round Trip) 20 ft along counter, intermittent UE support             Balance Exercises - 06/03/17 1444      Balance Exercises: Standing   Tandem Gait Foam/compliant surface;Forward;Retro;Intermittent upper extremity support;5 reps   Sidestepping Foam/compliant support;3 reps;Upper extremity support  midfoot/heels/toes on beam; 1 pole A & SBA of PT           PT Education - 06/03/17 1529    Education provided Yes   Education Details HEP update   Person(s) Educated Patient   Methods Explanation;Demonstration;Handout   Comprehension Verbalized understanding;Returned demonstration          PT Short Term Goals - 01/23/17 1648      PT  SHORT TERM GOAL #1   Title Independent with initial HEP by 01/24/17   Status Achieved     PT SHORT TERM GOAL #2   Title Berg >/= 42 to reduce risk for falls by 01/24/17   Status Achieved           PT Long Term Goals - 05/01/17 1526      PT LONG TERM GOAL #1   Title Independent with advanced HEP    Status Partially Met  met for current HEP   Target Date 07/04/17     PT LONG TERM GOAL #2   Title B LE strength >/= 4/5 for improved gait stability   Status Partially Met  met except B hip adduction & L ankle DF/PF/INV   Target Date 07/04/17     PT LONG TERM GOAL #3   Title Berg >/= 48/56 to reduce risk for falls by 03/14/17   Status Achieved     PT LONG TERM GOAL #4   Title DGI >/= 20/24 to improve gait safety and stability by 03/14/17   Status Achieved     PT LONG TERM GOAL #5   Title Pt will ambulate with normal gait pattern w/o evidence of L foot drop/slap    Status Partially Met  pt able  to ambulate with good heel strike and heel-toe progression with conscious effort but L foot slap becomes more apparent during dynamic tasks or with fatigue   Target Date 07/04/17     PT LONG TERM GOAL #6   Title Pt will perform sit <> stand from low seat height (14-15") w/o need for UE assist to allow increased ease of public toilet transfers    Status On-going   Target Date 07/04/17     PT LONG TERM GOAL #7   Title Pt will ascend/descend stairs with reciprocal pattern w/o railing or UE support to increase safety with community mobility    Status Partially Met  able to ascend/descend reciprocally but continued intermittent use of railing necessary   Target Date 07/04/17     PT LONG TERM GOAL #8   Title FGA >/= 25/30 to reduce fall risk to low   Status New   Target Date 07/04/17               Plan - 06/03/17 1451    Clinical Impression Statement Pt reporting continued benefit from DN with lessening of numbness and sensation of "more normal gait" w/o foot slap lasting for several days. Pt also noting improving stabilty and confidence on uneven ground, able to work in yard and garden for several hours this morning w/o issues, but did arrive to PT more fatigued from effort. Despite fatigue, she was still able to demonstrate improved DF/PF strength heel raises upon rising from squats, heel and toe walking, and improved eccentric control with L single limb lowering from B heel raises, therefore HEP update to include these activities/exercises.   Rehab Potential Good   Clinical Impairments Affecting Rehab Potential h/o scoliosis, DDD, neuropathy, remote h/o back surgery    PT Treatment/Interventions Patient/family education;Neuromuscular re-education;Balance training;Therapeutic exercise;Therapeutic activities;Functional mobility training;Gait training;Stair training;Manual techniques;Taping;Dry needling;Electrical Stimulation;ADLs/Self Care Home Management   PT Next Visit Plan balance,  proprioceptive & coordination training; gait training to normalize gait pattern + dynamic gait activities; core & LE strengthening with emphasis on hip and ankles as indicated to facilitate balance; L foot intrinsic muscle re-education/strengthening   Consulted and Agree with Plan of Care Patient  Patient will benefit from skilled therapeutic intervention in order to improve the following deficits and impairments:  Decreased strength, Decreased balance, Decreased coordination, Impaired sensation, Abnormal gait, Difficulty walking, Decreased activity tolerance, Postural dysfunction  Visit Diagnosis: Unsteadiness on feet  Ataxic gait  Foot drop, left  Muscle weakness (generalized)     Problem List Patient Active Problem List   Diagnosis Date Noted  . Ventricular premature beats 10/31/2015  . Heart murmur 10/31/2015    Percival Spanish, PT, MPT 06/03/2017, 7:26 PM  [facility] [address]  [place], [place], [address] Phone: [phone]   Fax:  [phone]  Name: Makaia Rappa MRN: [id number] Date of Birth: [birth year]

## 2017-06-10 ENCOUNTER — Ambulatory Visit: Payer: Medicare Other | Admitting: Physical Therapy

## 2017-06-17 ENCOUNTER — Ambulatory Visit: Payer: Medicare Other | Attending: Neurology | Admitting: Physical Therapy

## 2017-06-17 DIAGNOSIS — M6281 Muscle weakness (generalized): Secondary | ICD-10-CM | POA: Diagnosis present

## 2017-06-17 DIAGNOSIS — M21372 Foot drop, left foot: Secondary | ICD-10-CM

## 2017-06-17 DIAGNOSIS — R260 Ataxic gait: Secondary | ICD-10-CM | POA: Diagnosis present

## 2017-06-17 DIAGNOSIS — R2681 Unsteadiness on feet: Secondary | ICD-10-CM

## 2017-06-17 NOTE — Therapy (Signed)
[facility] [address]  [place], [place], [address] Phone: [phone]   Fax:  [phone]  Physical Therapy Treatment  Patient Details  Name: [name] Trujillo MRN: [id number] Date of Birth: [birth year] Referring Provider: Sarina Ill, MD  Encounter Date: 06/17/2017      PT End of Session - 06/17/17 1450    Visit Number 33   Number of Visits 35   Date for PT Re-Evaluation 07/04/17   Authorization Type UHC Medicare [place]: [place]   PT Start Time 1448   PT Stop Time 1529   PT Time Calculation (min) 41 min   Activity Tolerance Patient tolerated treatment well   Behavior During Therapy [facility] for tasks assessed/performed      Past Medical History:  Diagnosis Date  . High cholesterol   . Hypothyroidism   . Neuropathy     Past Surgical History:  Procedure Laterality Date  . APPENDECTOMY     40-20 yo  . BACK SURGERY     Age 27  . CESAREAN SECTION    . ECTOPIC PREGNANCY SURGERY    . LAPAROSCOPIC HYSTERECTOMY      There were no vitals filed for this visit.      Subjective Assessment - 06/17/17 1453    Subjective Pt reports she fell at home over a week ago when she tripped over her portable sewing machine, landing on her R buttocks.    Pertinent History Remote h/o surgery on L sciatic nerve; h/o lumbar HNP; pes planus   Patient Stated Goals "to get my balance back so I can feel safer in the yard."   Currently in Pain? No/denies   Pain Score 0-No pain                         [facility] Adult PT Treatment/Exercise - 06/17/17 1450      Knee/Hip Exercises: Stretches   Other Knee/Hip Stretches standing self-release to glute medius/minimus & piriformis using small ball on wall x1' each     Knee/Hip Exercises: Aerobic   Recumbent Bike lvl 3 x 6''     Manual Therapy   Manual Therapy Soft tissue mobilization;Myofascial release   Soft tissue mobilization L peroneus/fibularis muscles   Myofascial  Release L fibularis longus & brevis     Ankle Exercises: Stretches   Other Stretch manual L fibularis longus/brevis stretch 3x30"     Ankle Exercises: Supine   Isometrics L ankle inversion 10x5" - manual resistance by PT          Trigger Point Dry Needling - 06/17/17 1450    Consent Given? Yes   Muscles Treated Lower Body --  L fibularis longus & brevis, tibialis posterior   Tibialis Anterior Response Twitch response elicited;Palpable increased muscle length  L fibularis longus & brevis, tibialis posterior                PT Short Term Goals - 01/23/17 1648      PT SHORT TERM GOAL #1   Title Independent with initial HEP by 01/24/17   Status Achieved     PT SHORT TERM GOAL #2   Title Berg >/= 42 to reduce risk for falls by 01/24/17   Status Achieved           PT Long Term Goals - 05/01/17 1526      PT LONG TERM GOAL #1   Title Independent with advanced HEP  Status Partially Met  met for current HEP   Target Date 07/04/17     PT LONG TERM GOAL #2   Title B LE strength >/= 4/5 for improved gait stability   Status Partially Met  met except B hip adduction & L ankle DF/PF/INV   Target Date 07/04/17     PT LONG TERM GOAL #3   Title Berg >/= 48/56 to reduce risk for falls by 03/14/17   Status Achieved     PT LONG TERM GOAL #4   Title DGI >/= 20/24 to improve gait safety and stability by 03/14/17   Status Achieved     PT LONG TERM GOAL #5   Title Pt will ambulate with normal gait pattern w/o evidence of L foot drop/slap    Status Partially Met  pt able to ambulate with good heel strike and heel-toe progression with conscious effort but L foot slap becomes more apparent during dynamic tasks or with fatigue   Target Date 07/04/17     PT LONG TERM GOAL #6   Title Pt will perform sit <> stand from low seat height (14-15") w/o need for UE assist to allow increased ease of public toilet transfers    Status On-going   Target Date 07/04/17     PT LONG TERM GOAL  #7   Title Pt will ascend/descend stairs with reciprocal pattern w/o railing or UE support to increase safety with community mobility    Status Partially Met  able to ascend/descend reciprocally but continued intermittent use of railing necessary   Target Date 07/04/17     PT LONG TERM GOAL #8   Title FGA >/= 25/30 to reduce fall risk to low   Status New   Target Date 07/04/17               Plan - 06/17/17 1458    Clinical Impression Statement [name] Trujillo reporting trip and fall ~1.5 weeks ago landing on her R lateral hip. Increased pain initially, but states this has been slowly subsiding. Deferred her hip exercises since the fall since they exacerbated her pain  and also noting difficulty leading with R foot on stair ascent. Mechanism of injury and ttp indicate likely trauma to/irritation of R glute medius/minimus, therefore encouraged pt to work on self-release with small ball on wall as this has worked well for her in the past, then ease back into hip strengthening program. Pt also noting increased tightness in L distal lateral leg, most like due to compensation for increased hip pain, with increased muscle tension and ttp noted in L fibularis muscles today, therefore performed DN to these muscles as pt has had positive response to this in the past. Pt instructed in manual stretch to promote maintenance of improved flexibility created after DN. 2 visits remaining in current POC, therefore will work toward final updates/modifications of HEP in prep for anticipated discharge.   Rehab Potential Good   Clinical Impairments Affecting Rehab Potential h/o scoliosis, DDD, neuropathy, remote h/o back surgery    PT Treatment/Interventions Patient/family education;Neuromuscular re-education;Balance training;Therapeutic exercise;Therapeutic activities;Functional mobility training;Gait training;Stair training;Manual techniques;Taping;Dry needling;Electrical Stimulation;ADLs/Self Care Home Management   PT  Next Visit Plan balance, proprioceptive & coordination training; gait training to normalize gait pattern + dynamic gait activities; core & LE strengthening with emphasis on hip and ankles as indicated to facilitate balance; L foot intrinsic muscle re-education/strengthening   Consulted and Agree with Plan of Care Patient      Patient will benefit from skilled therapeutic  intervention in order to improve the following deficits and impairments:  Decreased strength, Decreased balance, Decreased coordination, Impaired sensation, Abnormal gait, Difficulty walking, Decreased activity tolerance, Postural dysfunction  Visit Diagnosis: Unsteadiness on feet  Ataxic gait  Foot drop, left  Muscle weakness (generalized)     Problem List Patient Active Problem List   Diagnosis Date Noted  . Ventricular premature beats 10/31/2015  . Heart murmur 10/31/2015    Percival Spanish, PT, MPT 06/17/2017, 4:58 PM  [facility] [address]  [place], [place], [address] Phone: [phone]   Fax:  [phone]  Name: [name] Trujillo MRN: [id number] Date of Birth: [birth year]

## 2017-06-24 ENCOUNTER — Ambulatory Visit: Payer: Medicare Other | Admitting: Physical Therapy

## 2017-07-01 ENCOUNTER — Ambulatory Visit: Payer: Medicare Other | Admitting: Physical Therapy

## 2017-07-01 DIAGNOSIS — R2681 Unsteadiness on feet: Secondary | ICD-10-CM

## 2017-07-01 DIAGNOSIS — M6281 Muscle weakness (generalized): Secondary | ICD-10-CM

## 2017-07-01 DIAGNOSIS — R260 Ataxic gait: Secondary | ICD-10-CM

## 2017-07-01 DIAGNOSIS — M21372 Foot drop, left foot: Secondary | ICD-10-CM

## 2017-07-01 NOTE — Therapy (Addendum)
[facility] [address]  [place], [place], [address] Phone: [phone]   Fax:  [phone]  Physical Therapy Treatment  Patient Details  Name: [name] Trujillo MRN: [id number] Date of Birth: Jan 02, 1941 Referring Provider: Sarina Ill, MD  Encounter Date: 07/01/2017      PT End of Session - 07/01/17 1445    Visit Number 34   Number of Visits 35   Date for PT Re-Evaluation 07/04/17   Authorization Type U[employer] Medicare - [place]: [place]   PT Start Time 1445   PT Stop Time 1535   PT Time Calculation (min) 50 min   Activity Tolerance Patient tolerated treatment well   Behavior During Therapy [facility] for tasks assessed/performed      Past Medical History:  Diagnosis Date  . High cholesterol   . Hypothyroidism   . Neuropathy     Past Surgical History:  Procedure Laterality Date  . APPENDECTOMY     90-18 yo  . BACK SURGERY     Age 64  . CESAREAN SECTION    . ECTOPIC PREGNANCY SURGERY    . LAPAROSCOPIC HYSTERECTOMY      There were no vitals filed for this visit.      Subjective Assessment - 07/01/17 1448    Subjective Pt reporting she has had on and off increased pain in R leg since her fall 3 weeks ago, but denies pain today.   Pertinent History Remote h/o surgery on L sciatic nerve; h/o lumbar HNP; pes planus   Patient Stated Goals "to get my balance back so I can feel safer in the yard."   Currently in Pain? No/denies   Pain Score 0-No pain            [facility] PT Assessment - 07/01/17 1445      Assessment   Medical Diagnosis Abnormality of gait; L foot drop; Peripheral neuropathy   Referring Provider Sarina Ill, MD   Next MD Visit none scheduled     Observation/Other Assessments   Focus on Therapeutic Outcomes (FOTO)  Foot - 68% (32% limitation     AROM   Right Ankle Dorsiflexion 7   Right Ankle Plantar Flexion 62   Right Ankle Inversion 26   Right Ankle Eversion 22   Left Ankle Dorsiflexion 6   Left Ankle Plantar Flexion 60   Left Ankle Inversion 17   Left Ankle Eversion 25     Strength   Right Hip Flexion 4+/5   Right Hip Extension 4+/5   Right Hip External Rotation  4/5   Right Hip Internal Rotation 4/5   Right Hip ABduction 4/5   Right Hip ADduction 4/5   Left Hip Flexion 4+/5   Left Hip Extension 4+/5   Left Hip External Rotation 4/5   Left Hip Internal Rotation 4+/5   Left Hip ABduction 4+/5   Left Hip ADduction 4/5   Right Knee Flexion 5/5   Right Knee Extension 5/5   Left Knee Flexion 5/5   Left Knee Extension 5/5   Right Ankle Dorsiflexion 4+/5   Right Ankle Plantar Flexion 4/5   Right Ankle Inversion 4/5   Right Ankle Eversion 4+/5   Left Ankle Dorsiflexion 4-/5   Left Ankle Plantar Flexion 3+/5   Left Ankle Inversion 4-/5   Left Ankle Eversion 4/5     Functional Gait  Assessment   Gait Level Surface Walks 20 ft in less than 7 sec but greater than 5.5 sec, uses  assistive device, slower speed, mild gait deviations, or deviates 6-10 in outside of the 12 in walkway width.   Change in Gait Speed Able to change speed, demonstrates mild gait deviations, deviates 6-10 in outside of the 12 in walkway width, or no gait deviations, unable to achieve a major change in velocity, or uses a change in velocity, or uses an assistive device.   Gait with Horizontal Head Turns Performs head turns smoothly with no change in gait. Deviates no more than 6 in outside 12 in walkway width   Gait with Vertical Head Turns Performs head turns with no change in gait. Deviates no more than 6 in outside 12 in walkway width.   Gait and Pivot Turn Pivot turns safely within 3 sec and stops quickly with no loss of balance.   Step Over Obstacle Is able to step over 2 stacked shoe boxes taped together (9 in total height) without changing gait speed. No evidence of imbalance.   Gait with Narrow Base of Support Ambulates 4-7 steps.   Gait with Eyes Closed Walks 20 ft, slow speed, abnormal gait  pattern, evidence for imbalance, deviates 10-15 in outside 12 in walkway width. Requires more than 9 sec to ambulate 20 ft.   Ambulating Backwards Walks 20 ft, no assistive devices, good speed, no evidence for imbalance, normal gait   Steps Alternating feet, must use rail.   Total Score 23                               PT Short Term Goals - 01/23/17 1648      PT SHORT TERM GOAL #1   Title Independent with initial HEP by 01/24/17   Status Achieved     PT SHORT TERM GOAL #2   Title Berg >/= 42 to reduce risk for falls by 01/24/17   Status Achieved           PT Long Term Goals - 07/01/17 1455      PT LONG TERM GOAL #1   Title Independent with advanced HEP    Status Achieved     PT LONG TERM GOAL #2   Title B LE strength >/= 4/5 for improved gait stability   Status Partially Met  met except L ankle DF & inv 4-/5 & PF 3+/5     PT LONG TERM GOAL #3   Title Berg >/= 48/56 to reduce risk for falls by 03/14/17   Status Achieved     PT LONG TERM GOAL #4   Title DGI >/= 20/24 to improve gait safety and stability by 03/14/17   Status Achieved     PT LONG TERM GOAL #5   Title Pt will ambulate with normal gait pattern w/o evidence of L foot drop/slap    Status Achieved     PT LONG TERM GOAL #6   Title Pt will perform sit <> stand from low seat height (14-15") w/o need for UE assist to allow increased ease of public toilet transfers    Status Achieved     PT LONG TERM GOAL #7   Title Pt will ascend/descend stairs with reciprocal pattern w/o railing or UE support to increase safety with community mobility    Status Partially Met  able to ascend/descend reciprocally but continued intermittent use of railing necessary on descent for sense of security     PT LONG TERM GOAL #8   Title FGA >/= 25/30 to  reduce fall risk to low   Status Not Met  FGA = 23/30 (improved from 19/30)               Plan - July 19, 2017 1445    Clinical Impression Statement [name] Trujillo  reporting "tremedous" improvement from PT, noting improved movement and control of both feet/ankles (especially L), increased overall strength, and improved balance and gait stability with only rare instances of L foot slap. Overall LE strength now 4/5 or greater with exception of L ankle, although still significantly improved. Balance greatly improved across all balance testing with only FGA goal not met. Remaining goals met or paritally met. Reviewed recommended continued HEP and pt feels confident with current exercises and ready to to transition to HEP at this time, but would like to keep chart open for 30 days in event that she experiences issues as she continues to progress with exercises at home.   Rehab Potential Good   Clinical Impairments Affecting Rehab Potential h/o scoliosis, DDD, neuropathy, remote h/o back surgery    PT Treatment/Interventions Patient/family education;Neuromuscular re-education;Balance training;Therapeutic exercise;Therapeutic activities;Functional mobility training;Gait training;Stair training;Manual techniques;Taping;Dry needling;Electrical Stimulation;ADLs/Self Care Home Management   PT Next Visit Plan transition to HEP - 30 day hold   Consulted and Agree with Plan of Care Patient      Patient will benefit from skilled therapeutic intervention in order to improve the following deficits and impairments:  Decreased strength, Decreased balance, Decreased coordination, Impaired sensation, Abnormal gait, Difficulty walking, Decreased activity tolerance, Postural dysfunction  Visit Diagnosis: Unsteadiness on feet  Ataxic gait  Foot drop, left  Muscle weakness (generalized)       G-Codes - 19-Jul-2017 1541    Functional Assessment Tool Used (Outpatient Only) Foot FOTO = 68% (32% limitation   Functional Limitation Mobility: Walking and moving around   Mobility: Walking and Moving Around Goal Status [id number]) At least 20 percent but less than 40 percent impaired,  limited or restricted   Mobility: Walking and Moving Around Discharge Status [phone]) At least 20 percent but less than 40 percent impaired, limited or restricted      Problem List Patient Active Problem List   Diagnosis Date Noted  . Ventricular premature beats 10/31/2015  . Heart murmur 10/31/2015    Percival Spanish, PT, MPT 07/19/2017, 6:21 PM  [facility] [address]  [place], [place], [address] Phone: [phone]   Fax:  [phone]  Name: [name] Trujillo MRN: [id number] Date of Birth: [birth year]  PHYSICAL THERAPY DISCHARGE SUMMARY  Visits from Start of Care: 52  Current functional level related to goals / functional outcomes:   Refer to above clinical impression. Pt has not needed to return to PT in >30 days, therefore will proceed with discharge from PT for this epsiode.   Remaining deficits:   As above.   Education / Equipment:   HEP  Plan: Patient agrees to discharge.  Patient goals were partially met. Patient is being discharged due to being pleased with the current functional level.  ?????    Percival Spanish, PT, MPT 08/18/17, 11:02 AM  [facility] [name]  [place], [place], [address] Phone: [phone]   Fax:  [phone]

## 2017-07-08 ENCOUNTER — Ambulatory Visit: Payer: Medicare Other | Admitting: Physical Therapy

## 2018-02-05 DIAGNOSIS — H43813 Vitreous degeneration, bilateral: Secondary | ICD-10-CM | POA: Insufficient documentation

## 2018-07-08 ENCOUNTER — Telehealth: Payer: Self-pay | Admitting: Cardiovascular Disease

## 2018-07-08 NOTE — Telephone Encounter (Signed)
Received records from [facility] on 07/08/18, Appt 08/03/18 @ 2:00AM.[place]

## 2018-08-03 ENCOUNTER — Ambulatory Visit: Payer: Medicare Other | Admitting: Cardiovascular Disease

## 2018-08-03 ENCOUNTER — Encounter

## 2018-08-03 ENCOUNTER — Encounter: Payer: Self-pay | Admitting: Cardiovascular Disease

## 2018-08-03 VITALS — BP 152/70 | HR 82 | Ht 62.0 in | Wt 128.0 lb

## 2018-08-03 DIAGNOSIS — E039 Hypothyroidism, unspecified: Secondary | ICD-10-CM | POA: Diagnosis not present

## 2018-08-03 DIAGNOSIS — R030 Elevated blood-pressure reading, without diagnosis of hypertension: Secondary | ICD-10-CM

## 2018-08-03 DIAGNOSIS — R000 Tachycardia, unspecified: Secondary | ICD-10-CM | POA: Diagnosis not present

## 2018-08-03 DIAGNOSIS — R0789 Other chest pain: Secondary | ICD-10-CM

## 2018-08-03 DIAGNOSIS — E7800 Pure hypercholesterolemia, unspecified: Secondary | ICD-10-CM

## 2018-08-03 NOTE — Progress Notes (Signed)
Cardiology Consultation Note:    Date:  08/03/2018   ID:  Viva Gallaher, DOB 10-29-1940, MRN [id number]  PCP:  Mosetta Putt, MD  Cardiologist:  No primary care provider on file.  Electrophysiologist:  None   Referring MD: Mosetta Putt, MD   Chief Complaint  Patient presents with  . Follow-up    pt stated she was walking at a fitness center and her heart rate went up to159 bpm  [name] Trujillo is a 77 y.o. female who is being seen today for the evaluation of tachycardia at the request of Mosetta Putt, MD.   History of Present Illness:    [name] Trujillo is a 77 y.o. female with a hx of treated hyperlipidemia and hypothyroidism presenting after detection of asymptomatic tachycardia.  During evaluation for a new exercise program at a fitness center, a pulse oximeter showed a heart rate of 195 bpm.  She was completely asymptomatic at the time.  She was not aware of palpitations.  Several years ago she had an episode of mild chest discomfort and was found to have a heart rate of 192.  She was not aware of palpitations at that time, either.  She has never experienced syncope or near syncope.  She denies exertional chest pain.  She has mild exertional dyspnea which she attributes to deconditioning.  She denies leg edema, claudication, she does not have a history of stroke/TIA or focal neurological events.  She has not had bleeding problems or falls.  Several months ago she had an isolated episode of substernal chest pain associated with a bad dream.  Has not occurred during intense physical activity.  In 2017 she underwent an echocardiogram which was essentially a normal study with the exception of aortic valve sclerosis and grade 1 diastolic dysfunction.  There was no left ventricular hypertrophy and the left atrium is normal in size.  Also in 2017 a 24-hour Holter monitor showed normal rhythm with occasional PACs and occasional PVCs.  She has problems with lumbar radiculopathy; she has  residual problems of left foot dorsiflexion and paresthesias related to peripheral neuropathy.  Pulmonary function tests have reportedly shown findings consistent with COPD, attributed to childhood secondhand smoke exposure.  Her father had myocardial infarction, but this occurred at the age of 32 when he passed away.  Her mother lived to be 58 and died of a stroke.  She has 2 siblings that are close and age and have good health.  Her most recent lipid profile showed an LDL of 93 and LDL particle number of 1366.  Her hemoglobin A1c was 5.5.  Her TSH was within normal range.  Past Medical History:  Diagnosis Date  . High cholesterol   . Hypothyroidism   . Neuropathy     Past Surgical History:  Procedure Laterality Date  . APPENDECTOMY     4-20 yo  . BACK SURGERY     Age 41  . CESAREAN SECTION    . ECTOPIC PREGNANCY SURGERY    . LAPAROSCOPIC HYSTERECTOMY      Current Medications: Current Meds  Medication Sig  . levothyroxine (SYNTHROID, LEVOTHROID) 100 MCG tablet Take 100 mcg by mouth daily before breakfast.   . pravastatin (PRAVACHOL) 40 MG tablet Take 1 tablet by mouth daily.  . solifenacin (VESICARE) 10 MG tablet      Allergies:   Hydrocodone   Social History   Socioeconomic History  . Marital status: Married    Spouse name: Not on file  . Number of children:  7  . Years of education: 316  . Highest education level: Not on file  Occupational History  . Occupation: Retired  Engineer, productionocial Needs  . Financial resource strain: Not on file  . Food insecurity:    Worry: Not on file    Inability: Not on file  . Transportation needs:    Medical: Not on file    Non-medical: Not on file  Tobacco Use  . Smoking status: Never Smoker  . Smokeless tobacco: Never Used  Substance and Sexual Activity  . Alcohol use: Yes    Comment: once or twice a month  . Drug use: No  . Sexual activity: Not on file  Lifestyle  . Physical activity:    Days per week: Not on file    Minutes per  session: Not on file  . Stress: Not on file  Relationships  . Social connections:    Talks on phone: Not on file    Gets together: Not on file    Attends religious service: Not on file    Active member of club or organization: Not on file    Attends meetings of clubs or organizations: Not on file    Relationship status: Not on file  Other Topics Concern  . Not on file  Social History Narrative   Lives at home w/ her husband and son   Right-handed   Caffeine: 1 cup of coffee in the morning     Family History: The patient's family history includes Alcohol abuse in her father; Heart attack in her father; Stroke in her mother. There is no history of Neuropathy.  ROS:   Please see the history of present illness.     All other systems reviewed and are negative.  EKGs/Labs/Other Studies Reviewed:    The following studies were reviewed today: Dr. Geoffery LyonsBlomgren's extensive notes, copy of previous ECG, echo from 2017, Holter monitor from 2017  EKG:  EKG is  ordered today.  The ekg ordered today demonstrates normal sinus rhythm, normal tracing  ECG performed and Dr. Geoffery LyonsBlomgren's office in February shows sinus rhythm with frequent PACs with a distinct inverted T waves suggesting junctional origin.  Recent Labs: Labs from October 30, 2017 Hemoglobin 14.2, hemoglobin A1c 5.5%, glucose 98, potassium 4.3, creatinine 0.79, high-sensitivity CRP 0.6, normal liver function tests, normal TSH 0.99 Recent Lipid Panel Total cholesterol 168, LDL 93, HDL 52, triglycerides 116, LDL particle # 1366, LDL size 21.0  Physical Exam:    VS:  BP (!) 152/70   Pulse 82   Ht 5\' 2"  (1.575 m)   Wt 128 lb (58.1 kg)   BMI 23.41 kg/m     Wt Readings from Last 3 Encounters:  08/03/18 128 lb (58.1 kg)  11/26/16 126 lb 9.6 oz (57.4 kg)     GEN: Slender, well nourished, well developed in no acute distress HEENT: Normal NECK: No JVD; No carotid bruits LYMPHATICS: No lymphadenopathy CARDIAC: RRR, no murmurs,  rubs, gallops RESPIRATORY:  Clear to auscultation without rales, wheezing or rhonchi  ABDOMEN: Soft, non-tender, non-distended MUSCULOSKELETAL:  No edema; No deformity  SKIN: Warm and dry NEUROLOGIC:  Alert and oriented x 3; walks with slightly asymmetric gait due to left foot drop PSYCHIATRIC:  Normal affect   ASSESSMENT:    No diagnosis found. PLAN:    In order of problems listed above:  1. Tachycardia: If her heart rate was indeed 195 bpm, it cannot be attributed to sinus tachycardia.  Since she was completely asymptomatic it is  doubtful that she had a ventricular arrhythmia.  The only SVT that would be important to diagnose would be paroxysmal atrial fibrillation.  That would raise the question of anticoagulation in this 77 year old (automatically she would have a CHADSVasc score of 3 due to age and gender).  If any other SVT is identified, it would be very questionable as to the need for treatment, since she is asymptomatic.  Plan a 30-day event monitor.  We reviewed implantable loop recorders, but that would probably be an excessive response to what is essentially an asymptomatic event. 2. HLP: Considering the absence of structural heart disease on echocardiography or of any known atherosclerotic abnormalities, the current lipid profile is well within target range. 3. Hypothyroidism: Euthyroid on hormone supplementation. 4. Chest pain: She had an episode of chest discomfort more than 6 months ago that has not recurred.  It was brief and occurred after a nightmare.  She was able to tolerate physical activity and a heart rate greater than 190 bpm without angina.  I do not think she needs evaluation for coronary disease at this time. 5. Elevated blood pressure: This is likely situational hypertension, being seen in a new doctor's office.  However, she had similar blood pressure when she saw Dr. Duaine Dredge recently.  Would like her to monitor her blood pressure to make sure she does not have true  hypertension.  If this is identified, a beta-blocker or centrally acting calcium channel blocker may be the optimal choice.  Review her event monitor. If this is normal, would not plan regular cardiology follow-up at this time.   Medication Adjustments/Labs and Tests Ordered: Current medicines are reviewed at length with the patient today.  Concerns regarding medicines are outlined above.  No orders of the defined types were placed in this encounter.  No orders of the defined types were placed in this encounter.   Patient Instructions  Medication Instructions:  Dr Royann Shivers recommends that you continue on your current medications as directed. Please refer to the Current Medication list given to you today.  If you need a refill on your cardiac medications before your next appointment, please call your pharmacy.   Testing/Procedures: Your physician has recommended that you wear a 30-day event monitor. Event monitors are medical devices that record the heart's electrical activity. Doctors most often [place] these monitors to diagnose arrhythmias. Arrhythmias are problems with the speed or rhythm of the heartbeat. The monitor is a small, portable device. You can wear one while you do your normal daily activities. This is usually used to diagnose what is causing palpitations/syncope (passing out).  Follow-Up:  Dr Royann Shivers recommends that you follow-up with him as needed.    Signed, Thurmon Fair, MD  08/03/2018 3:09 PM    [facility]

## 2018-08-03 NOTE — Patient Instructions (Signed)
Medication Instructions:  Dr Royann Shiversroitoru recommends that you continue on your current medications as directed. Please refer to the Current Medication list given to you today.  If you need a refill on your cardiac medications before your next appointment, please call your pharmacy.   Testing/Procedures: Your physician has recommended that you wear a 30-day event monitor. Event monitors are medical devices that record the heart's electrical activity. Doctors most often us these monitors to diagnose arrhythmias. Arrhythmias are problems with the speed or rhythm of the heartbeat. The monitor is a small, portable device. You can wear one while you do your normal daily activities. This is usually used to diagnose what is causing palpitations/syncope (passing out).  Follow-Up:  Dr Royann Shiversroitoru recommends that you follow-up with him as needed.

## 2018-08-17 ENCOUNTER — Ambulatory Visit (INDEPENDENT_AMBULATORY_CARE_PROVIDER_SITE_OTHER): Payer: Medicare Other

## 2018-08-17 DIAGNOSIS — R000 Tachycardia, unspecified: Secondary | ICD-10-CM | POA: Diagnosis not present

## 2018-10-23 IMAGING — MR MR HEAD W/O CM
11 series · 48 of 48 positions shown · non-contrast
Comparison: None.

CLINICAL DATA: Ataxia.  Bilateral foot and calf numbness.

EXAM:
MRI HEAD WITHOUT CONTRAST
TECHNIQUE: Multiplanar, multiecho pulse sequences of the brain and surrounding
structures were obtained without intravenous contrast.

[Series 2: T1 · sagittal · 5.0mm · 0.45mm/px · 1 of 19 slices shown]
[im 1/19]
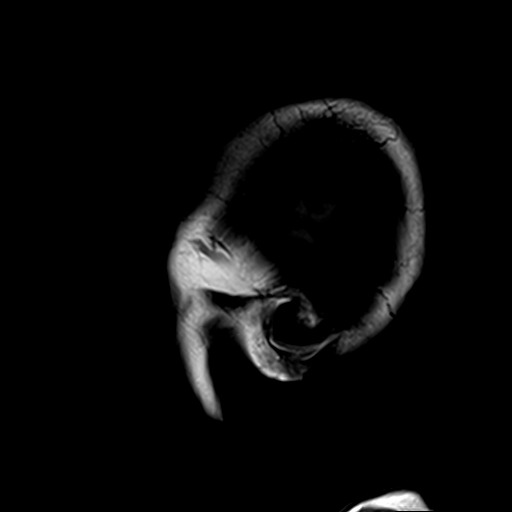

[Series 3: DWI · axial · 3.0mm · 1.80mm/px · z∈[-45,+96]mm · 7 of 96 slices shown (1 of 4)]
[im 1/96]
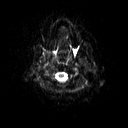
[im 16/96]
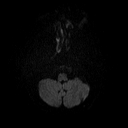
[im 32/96]
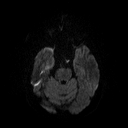
[im 48/96]
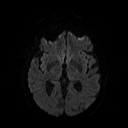
[im 64/96]
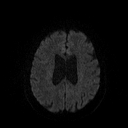
[im 80/96]
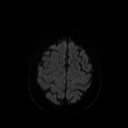
[im 96/96]
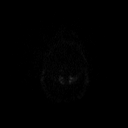

[Series 4: DWI · axial · 3.0mm · 1.80mm/px · z∈[-45,+96]mm · 4 of 47 slices shown (2 of 4)]
[im 1/47]
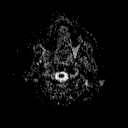
[im 16/47]
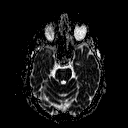
[im 31/47]
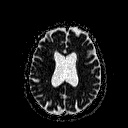
[im 47/47]
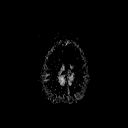

[Series 5: DWI · coronal · 5.0mm · 1.80mm/px · 5 of 62 slices shown (3 of 4)]
[im 1/62]
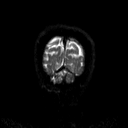
[im 16/62]
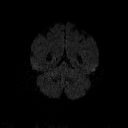
[im 31/62]
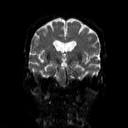
[im 46/62]
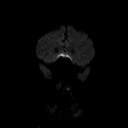
[im 62/62]
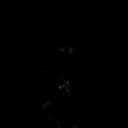

[Series 6: DWI · coronal · 5.0mm · 1.80mm/px · 2 of 31 slices shown (4 of 4)]
[im 1/31]
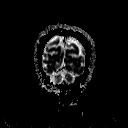
[im 31/31]
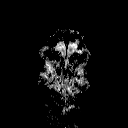

[Series 7: T2 · axial · 5.0mm · 0.51mm/px · z∈[-40,+93]mm · 2 of 20 slices shown (1 of 2)]
[im 1/20]
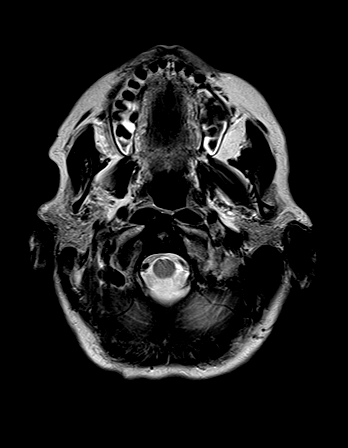
[im 20/20]
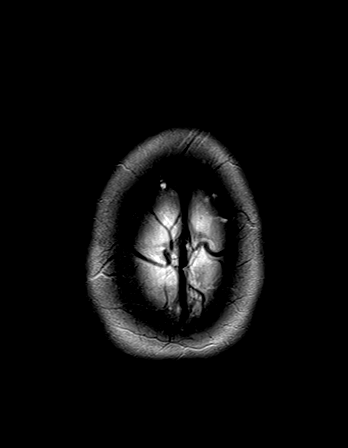

[Series 8: FLAIR · axial · 5.0mm · 0.45mm/px · z∈[-40,+93]mm · 2 of 20 slices shown]
[im 1/20]
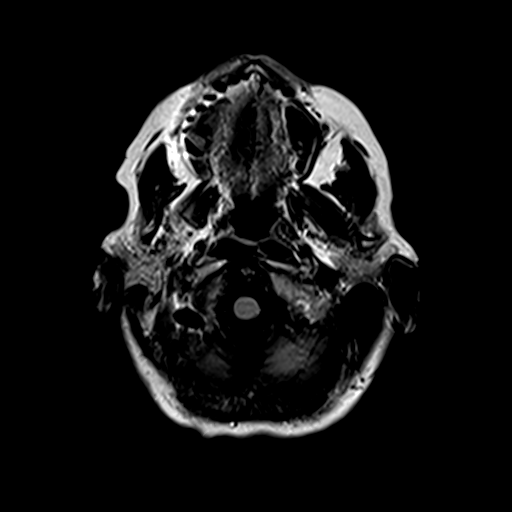
[im 20/20]
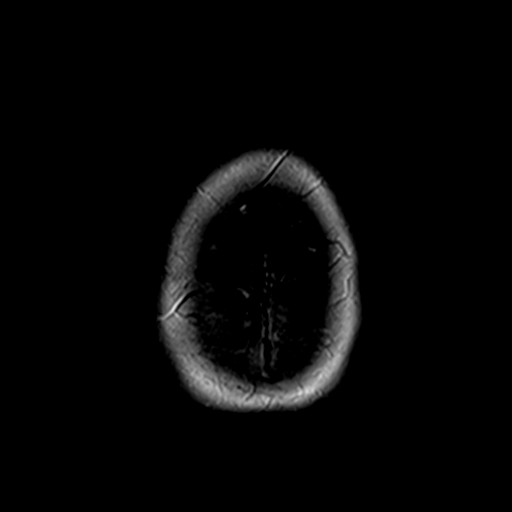

[Series 9: mip_images(sw) · axial · 16.0mm · 0.90mm/px · z∈[-46,+98]mm · 6 of 73 slices shown]
[im 1/73]
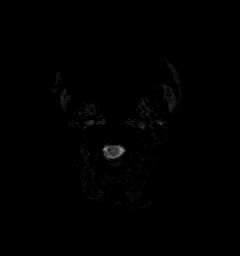
[im 15/73]
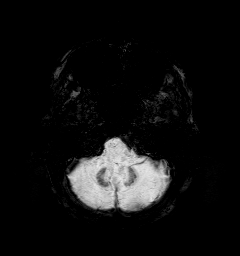
[im 29/73]
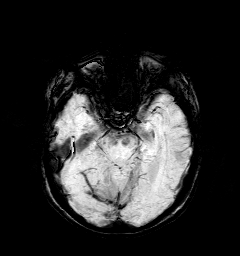
[im 44/73]
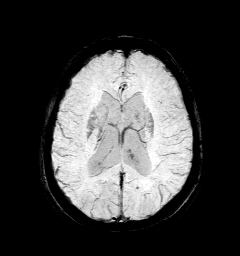
[im 58/73]
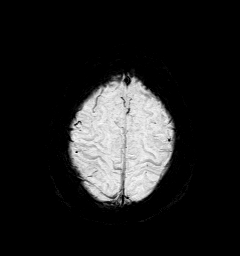
[im 73/73]
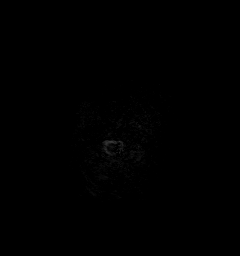

[Series 10: swi_images · axial · 2.0mm · 0.90mm/px · z∈[-53,+105]mm · 6 of 80 slices shown]
[im 1/80]
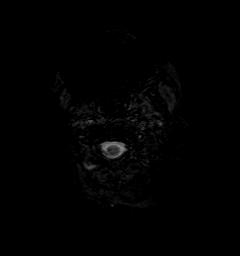
[im 16/80]
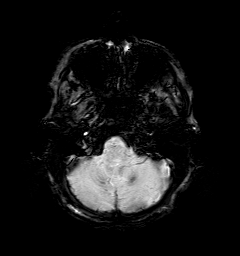
[im 32/80]
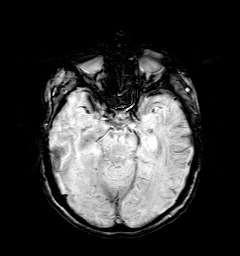
[im 48/80]
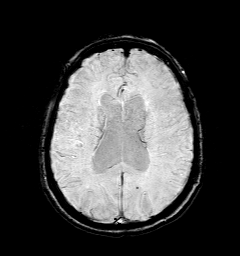
[im 64/80]
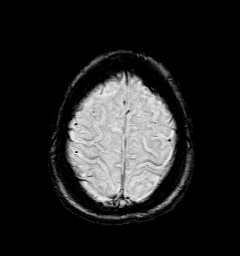
[im 80/80]
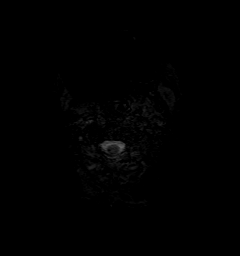

[Series 11: t1_mpr_tra · axial · 1.0mm · 0.72mm/px · z∈[-45,+98]mm · 11 of 144 slices shown]
[im 1/144]
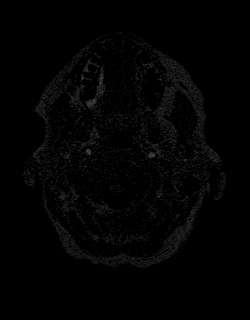
[im 15/144]
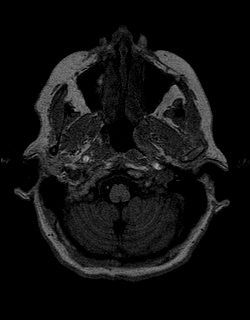
[im 29/144]
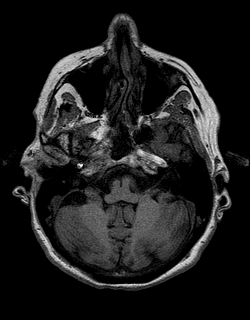
[im 43/144]
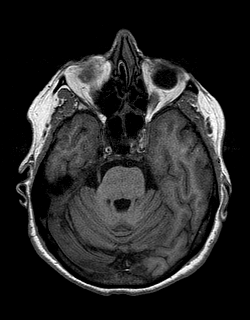
[im 58/144]
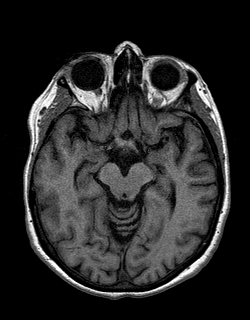
[im 72/144]
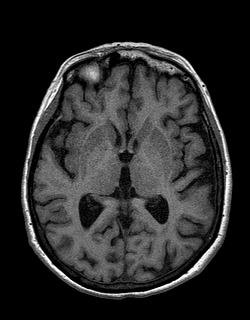
[im 86/144]
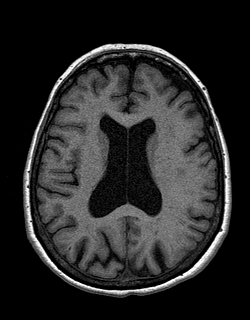
[im 101/144]
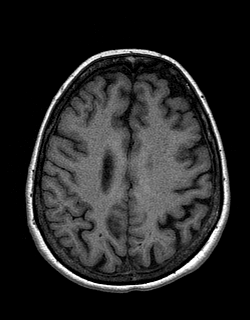
[im 115/144]
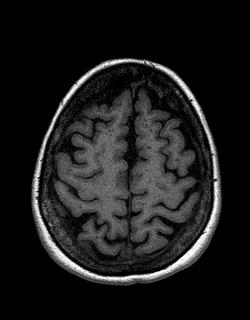
[im 129/144]
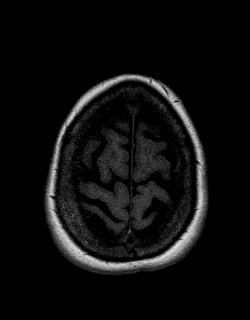
[im 144/144]
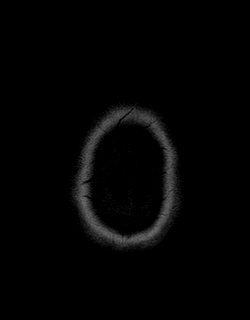

[Series 12: T2 · coronal · 5.0mm · 0.45mm/px · 2 of 23 slices shown (2 of 2)]
[im 1/23]
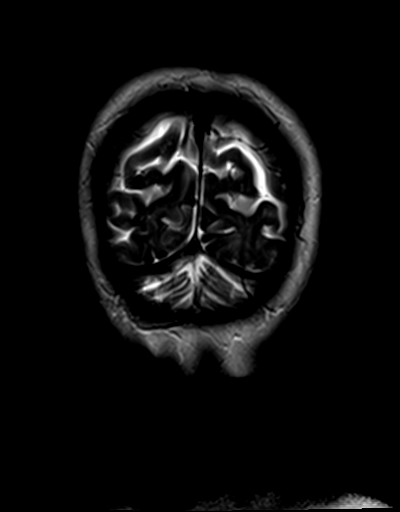
[im 23/23]
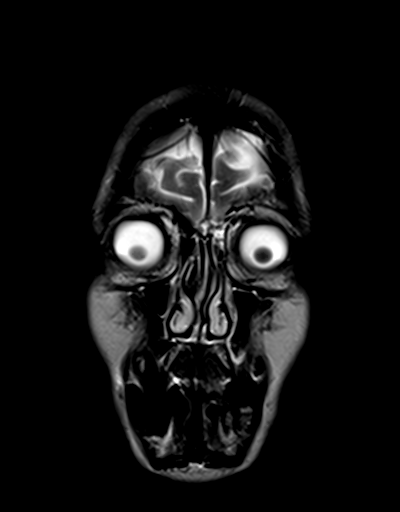

[48 of 48 positions shown; findings below may reference images not displayed]

FINDINGS: Brain: There is no evidence of acute infarct, intracranial
hemorrhage, mass, midline shift, or extra-axial fluid collection.
Small foci of T2 hyperintensity in the subcortical and
periventricular cerebral white matter bilaterally are nonspecific
but compatible with mild chronic small vessel ischemic disease. The
ventricles and sulci are within normal limits for age.

Vascular: Major intracranial vascular flow voids are preserved.

Skull and upper cervical spine: Unremarkable bone marrow signal.

Sinuses/Orbits: Unremarkable orbits. Mild mucosal thickening in the
paranasal sinuses. Clear mastoid air cells.

Other: None.
IMPRESSION: 1. No acute intracranial abnormality or mass.
2. Mild chronic small vessel ischemic disease.

## 2019-05-12 ENCOUNTER — Telehealth: Payer: Self-pay | Admitting: Physical Therapy

## 2019-05-24 ENCOUNTER — Other Ambulatory Visit: Payer: Self-pay

## 2019-05-24 ENCOUNTER — Ambulatory Visit: Payer: Medicare Other | Attending: Orthopedic Surgery | Admitting: Physical Therapy

## 2019-05-24 DIAGNOSIS — M21371 Foot drop, right foot: Secondary | ICD-10-CM | POA: Diagnosis present

## 2019-05-24 DIAGNOSIS — M21372 Foot drop, left foot: Secondary | ICD-10-CM | POA: Diagnosis present

## 2019-05-24 DIAGNOSIS — R2689 Other abnormalities of gait and mobility: Secondary | ICD-10-CM | POA: Insufficient documentation

## 2019-05-24 DIAGNOSIS — R2681 Unsteadiness on feet: Secondary | ICD-10-CM

## 2019-05-24 DIAGNOSIS — M6281 Muscle weakness (generalized): Secondary | ICD-10-CM

## 2019-05-24 NOTE — Therapy (Addendum)
[facility] [place] [address]  [address] [place], [place], [address] Phone: [phone]   Fax:  [phone]  Physical Therapy Evaluation  Patient Details  Name: [name] Trujillo MRN: [id number] Date of Birth: 29-Nov-1940 Referring Provider (PT): Toni ArthursJohn Hewitt, MD   Encounter Date: 05/24/2019  PT End of Session - 05/24/19 1101    Visit Number  1    Number of Visits  16    Date for PT Re-Evaluation  07/19/19    Authorization Type  UHC Medicare    PT Start Time  1101    PT Stop Time  1209    PT Time Calculation (min)  68 min    Activity Tolerance  Patient tolerated treatment well    Behavior During Therapy  [facility] for tasks assessed/performed       Past Medical History:  Diagnosis Date  . High cholesterol   . Hypothyroidism   . Neuropathy     Past Surgical History:  Procedure Laterality Date  . APPENDECTOMY     [age] yo  . BACK SURGERY     Age 78  . CESAREAN SECTION    . ECTOPIC PREGNANCY SURGERY    . LAPAROSCOPIC HYSTERECTOMY      There were no vitals filed for this visit.   Subjective Assessment - 05/24/19 1105    Subjective  Pt reporting gradual return of foot drop, now more bilaterally, worsening since activity decreased and [facility]. States it feels like she is wearing extremely tight socks with numbness in B distal lower legs and feet. States MD wanted her to get B AFO's and use a walker to reduce fall risk but she does not feel she need these and want to see if she can get things working again like we did back in 2018 (PT for similar issues).    Limitations  Standing    How long can you stand comfortably?  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  Yes    Pain Score  6    up to 9-10/10 for  numbness/tingling & tightness, but denies pain   Pain Location  Leg    Pain Orientation  Right;Left;Lower    Pain Descriptors / Indicators  Numbness;Tightness;Tingling    Pain Type  Acute pain;Chronic pain    Pain Frequency  Constant   but varies in intensity   Aggravating Factors   prolonged standing    Pain Relieving Factors  moving around    Effect of Pain on Daily Activities  diffculty squatting or bending over due to feeling that she might topple forward         [facility] - 05/24/19 1101      Assessment   Medical Diagnosis  Gait abnormality secondary to B foot drop    Referring Provider (PT)  Toni ArthursJohn Hewitt, MD    Onset Date/Surgical Date  --   initial onset 2018, exacerbation since March 2020   Next MD Visit  07/12/19    Prior Therapy  PT in 2018 for abnormality of gait due to peripheral neuropathy & L foot drop      Balance Screen   Has the patient fallen in the past 6 months  No  Has the patient had a decrease in activity level because of a fear of falling?   No    Is the patient reluctant to leave their home because of a fear of falling?   No      Home Environment   Living Environment  Private residence    Living Arrangements  Spouse/significant other    Type of Home  House    Home Access  Stairs to enter    Entrance Stairs-Number of Steps  5-6    Entrance Stairs-Rails  Left    Home Layout  One level;Laundry or work area in [employer]basement    Home Equipment  None      Prior Function   Level of Independence  Independent    Vocation  Retired    Leisure  working [employer] yard, walking ~3x/wk      Cognition   Overall Cognitive Status  Within Capital OneFunctional Limits for tasks assessed      ROM / Strength   AROM / PROM / Strength  AROM;Strength;PROM      AROM   AROM Assessment Site  Ankle    Right/Left Ankle  Right;Left    Right Ankle Dorsiflexion  -4    Right Ankle Plantar Flexion  50    Right Ankle Inversion  18    Right Ankle Eversion  28    Left Ankle  Dorsiflexion  -6    Left Ankle Plantar Flexion  48    Left Ankle Inversion  4    Left Ankle Eversion  18      PROM   Overall PROM   Within functional limits for tasks performed   except B DF   PROM Assessment Site  Ankle    Right/Left Ankle  Right;Left    Right Ankle Dorsiflexion  9    Left Ankle Dorsiflexion  4      Strength   Strength Assessment Site  Hip;Knee;Ankle    Right/Left Hip  Right;Left    Right Hip Flexion  4/5    Right Hip Extension  4/5    Right Hip External Rotation   4-/5    Right Hip Internal Rotation  4/5    Right Hip ABduction  4-/5    Right Hip ADduction  3+/5    Left Hip Flexion  4/5    Left Hip Extension  4-/5    Left Hip External Rotation  4-/5    Left Hip Internal Rotation  4/5    Left Hip ABduction  4-/5    Left Hip ADduction  3+/5    Right/Left Knee  Right;Left    Right Knee Flexion  4+/5    Right Knee Extension  4+/5    Left Knee Flexion  4+/5    Left Knee Extension  4+/5    Right/Left Ankle  Right;Left    Right Ankle Dorsiflexion  3+/5    Right Ankle Plantar Flexion  3/5    Right Ankle Inversion  3+/5    Right Ankle Eversion  4/5    Left Ankle Dorsiflexion  3-/5    Left Ankle Plantar Flexion  2/5    Left Ankle Inversion  1/5    Left Ankle Eversion  4-/5      Flexibility   Soft Tissue Assessment /Muscle Length  yes   B heelcord tightness   Hamstrings  mild/mod tightness B    Quadriceps  mild/mod hip flexor tightness R, mild/mod quad tightness on L    ITB  mild tight  on R    Piriformis  mod tightness B      Ambulation/Gait   Assistive device  None    Gait Pattern  Step-through pattern;Decreased dorsiflexion - left;Decreased dorsiflexion - right;Poor foot clearance - left;Poor foot clearance - right   B foot drop/slap (L>R)   Ambulation Surface  Level;Indoor                Objective measurements completed on examination: See above findings.      [place] [facility] [facility] PT Treatment/Exercise - 05/24/19 1101      Ankle Exercises:  Stretches   Soleus Stretch  30 seconds;2 reps    Soleus Stretch Limitations  standing    Gastroc Stretch  30 seconds;2 reps    Gastroc Stretch Limitations  standing    Other Stretch  B peroneal stretch 2 x 30 sec      Ankle Exercises: Seated   Heel Raises  Both;10 reps;3 seconds    Toe Raise  10 reps;3 seconds             PT Education - 05/24/19 1746    Education Details  PT eval findings, anticipated POC and initial HEP    Person(s) Educated  Patient    Methods  Explanation;Demonstration;Verbal cues;Handout    Comprehension  Verbalized understanding;Returned demonstration;Verbal cues required;Need further instruction       PT Short Term Goals - 05/24/19 1209      PT SHORT TERM GOAL #1   Title  Independent with initial HEP    Status  New    Target Date  06/14/19        PT Long Term Goals - 05/24/19 1209      PT LONG TERM GOAL #1   Title  Independent with advanced/ongoing HEP    Status  New    Target Date  07/19/19      PT LONG TERM GOAL #2   Title  B hip strength >/= 4/5 and B ankle strength >/= 3+/5 to 4-/5 for improved gait stability    Target Date  07/19/19      PT LONG TERM GOAL #3   Title  Berg >/= 52/56 to reduce risk for falls    Status  New    Target Date  07/19/19      PT LONG TERM GOAL #4   Title  FGA >/= 23/30 to improve gait safety and stability    Status  New    Target Date  07/19/19      PT LONG TERM GOAL #5   Title  Pt will ambulate with normal gait pattern w/o evidence of B foot drop/slap    Status  New    Target Date  07/19/19             Plan - 05/24/19 1209    Clinical Impression Statement  [name] Trujillo is a 78 y/o female who presents to OP with abnormal gait with B foot drop/slap (L>R). She was treated in 2018 by this PT for similar deficits including L foot drop, peroneal neuropathy and gait instability with significant improvement noted in strength, balance and foot/ankle control by the end of the episode. Patient admits she has  not kept with prior HEP and returns with decreased LE flexibility and decreased hip and ankle strength, most pronounced in L ankle DF and inversion. Gait patterns reveals decreased B active DF as well as lack of eccentric DF control on weight acceptance, L > R, with increased hip and knee flexion  to compensate and allow for foot clearance. She reports she does not feel as though her balance has declined as much as the deficits from her prior PT episode but will plan for formal assessment next visit. Given prior positive response to PT, anticipate [name] Trujillo will benefit from skilled PT for LE strengthening, balance and coordination training, and dynamic gait training to improve safety with gait and mobility and reduce risk for falls.    Personal Factors and Comorbidities  Time since onset of injury/illness/exacerbation;Past/Current Experience;Comorbidity 3+    Comorbidities  neuropathy; hypthyroidism; DDD; remote h/o back surgery at age 44; h/o scoliosis    Examination-Activity Limitations  Bend;Locomotion Level;Squat;Stand    Examination-Participation Restrictions  Yard Work;Community Activity;Laundry    Stability/Clinical Decision Making  Evolving/Moderate complexity    Clinical Decision Making  Moderate    Rehab Potential  Good    PT Frequency  2x / week   1-2x/wk per pt preference - pt wishing to start with 1x/wk to limit community exposure d/t COVID-19   PT Duration  8 weeks    PT Treatment/Interventions  ADLs/Self Care Home Management;Electrical Stimulation;Iontophoresis 4mg /ml Dexamethasone;Moist Heat;Ultrasound;Gait training;Stair training;Functional mobility training;Therapeutic activities;Therapeutic exercise;Balance training;Neuromuscular re-education;Patient/family education;Orthotic Fit/Training;Manual techniques;Passive range of motion;Dry needling;Taping;Joint Manipulations    PT Next Visit Plan  Balance assessment; progress B ankle ROM and LE strengthening    PT Home Exercise Plan  05/24/19 -  gastroc/soleus/peroneal stretches, seated heel-toe raises    Consulted and Agree with Plan of Care  Patient       Patient will benefit from skilled therapeutic intervention in order to improve the following deficits and impairments:  Abnormal gait, Decreased activity tolerance, Decreased balance, Decreased coordination, Decreased endurance, Decreased knowledge of use of DME, Decreased mobility, Decreased range of motion, Decreased safety awareness, Decreased strength, Difficulty walking, Increased edema, Increased fascial restricitons, Increased muscle spasms, Impaired perceived functional ability, Impaired flexibility, Pain  Visit Diagnosis: Other abnormalities of gait and mobility - Plan: PT plan of care cert/re-cert  Foot drop, left - Plan: PT plan of care cert/re-cert  Foot drop, right - Plan: PT plan of care cert/re-cert  Muscle weakness (generalized) - Plan: PT plan of care cert/re-cert  Unsteadiness on feet - Plan: PT plan of care cert/re-cert     Problem List Patient Active Problem List   Diagnosis Date Noted  . Ventricular premature beats 10/31/2015  . Heart murmur 10/31/2015    Marry Guan, PT, MPT 05/24/2019, 5:49 PM  [facility] [address]  [address] [place], [place], [address] Phone: [phone]   Fax:  [phone]  Name: [name] Trujillo MRN: [id number] Date of Birth: 26-Apr-1941

## 2019-05-27 ENCOUNTER — Other Ambulatory Visit: Payer: Self-pay

## 2019-05-27 ENCOUNTER — Ambulatory Visit: Payer: Medicare Other | Admitting: Physical Therapy

## 2019-05-27 ENCOUNTER — Encounter: Payer: Self-pay | Admitting: Physical Therapy

## 2019-05-27 DIAGNOSIS — M21372 Foot drop, left foot: Secondary | ICD-10-CM

## 2019-05-27 DIAGNOSIS — M6281 Muscle weakness (generalized): Secondary | ICD-10-CM

## 2019-05-27 DIAGNOSIS — R2689 Other abnormalities of gait and mobility: Secondary | ICD-10-CM | POA: Diagnosis not present

## 2019-05-27 DIAGNOSIS — R2681 Unsteadiness on feet: Secondary | ICD-10-CM

## 2019-05-27 DIAGNOSIS — M21371 Foot drop, right foot: Secondary | ICD-10-CM

## 2019-05-27 NOTE — Therapy (Signed)
[facility] [place] [address]  [address] [place], [place], [address] Phone: [phone]   Fax:  [phone]  Physical Therapy Treatment  Patient Details  Name: [name] Trujillo MRN: [id number] Date of Birth: [birth year] Referring Provider (PT): Toni ArthursJohn Hewitt, MD   Encounter Date: 05/27/2019  PT End of Session - 05/27/19 1312    Visit Number  2    Number of Visits  16    Date for PT Re-Evaluation  07/19/19    Authorization Type  UHC Medicare    PT Start Time  1312    PT Stop Time  1403    PT Time Calculation (min)  51 min    Activity Tolerance  Patient tolerated treatment well    Behavior During Therapy  [facility] for tasks assessed/performed       Past Medical History:  Diagnosis Date  . High cholesterol   . Hypothyroidism   . Neuropathy     Past Surgical History:  Procedure Laterality Date  . APPENDECTOMY     517-20 yo  . BACK SURGERY     Age 78  . CESAREAN SECTION    . ECTOPIC PREGNANCY SURGERY    . LAPAROSCOPIC HYSTERECTOMY      There were no vitals filed for this visit.      [facility] - 05/27/19 1312      Ambulation/Gait   Gait velocity  2.83 ft/sec      Standardized Balance Assessment   10 Meter Walk  11.61      Berg Balance Test   Sit to Stand  Able to stand without using hands and stabilize independently    Standing Unsupported  Able to stand safely 2 minutes    Sitting with Back Unsupported but Feet Supported on Floor or Stool  Able to sit safely and securely 2 minutes    Stand to Sit  Sits safely with minimal use of hands    Transfers  Able to transfer safely, minor use of hands    Standing Unsupported with Eyes Closed  Able to stand 10 seconds with supervision    Standing Unsupported with Feet Together  Able to place feet together independently and stand 1 minute safely    From Standing, Reach Forward with Outstretched Arm  Can reach forward >12 cm safely (5")    From Standing Position, Pick up  Object from Floor  Able to pick up shoe, needs supervision    From Standing Position, Turn to Look Behind Over each Shoulder  Looks behind one side only/other side shows less weight shift    Turn 360 Degrees  Able to turn 360 degrees safely in 4 seconds or less    Standing Unsupported, Alternately Place Feet on Step/Stool  Able to complete >2 steps/needs minimal assist    Standing Unsupported, One Foot in Front  Able to take small step independently and hold 30 seconds    Standing on One Leg  Tries to lift leg/unable to hold 3 seconds but remains standing independently    Total Score  44      Functional Gait  Assessment   Gait Level Surface  Walks 20 ft, slow speed, abnormal gait pattern, evidence for imbalance or deviates 10-15 in outside of the 12 in walkway width. Requires more than 7 sec to ambulate 20 ft.    Change in Gait Speed  Makes only minor adjustments to walking speed, or accomplishes a change in speed with significant gait deviations,  deviates 10-15 in outside the 12 in walkway width, or changes speed but loses balance but is able to recover and continue walking.    Gait with Horizontal Head Turns  Performs head turns smoothly with slight change in gait velocity (eg, minor disruption to smooth gait path), deviates 6-10 in outside 12 in walkway width, or uses an assistive device.    Gait with Vertical Head Turns  Performs task with slight change in gait velocity (eg, minor disruption to smooth gait path), deviates 6 - 10 in outside 12 in walkway width or uses assistive device    Gait and Pivot Turn  Pivot turns safely in greater than 3 sec and stops with no loss of balance, or pivot turns safely within 3 sec and stops with mild imbalance, requires small steps to catch balance.    Step Over Obstacle  Is able to step over one shoe box (4.5 in total height) without changing gait speed. No evidence of imbalance.    Gait with Narrow Base of Support  Ambulates less than 4 steps heel to toe or  cannot perform without assistance.    Gait with Eyes Closed  Cannot walk 20 ft without assistance, severe gait deviations or imbalance, deviates greater than 15 in outside 12 in walkway width or will not attempt task.    Ambulating Backwards  Walks 20 ft, slow speed, abnormal gait pattern, evidence for imbalance, deviates 10-15 in outside 12 in walkway width.    Steps  Alternating feet, must use rail.    Total Score  13    FGA comment:  < 19 = high risk fall                   [facility] Adult PT Treatment/Exercise - 05/27/19 1312      Knee/Hip Exercises: Aerobic   Recumbent Bike  L2 x 3 min      Ankle Exercises: Stretches   Other Stretch  B peroneal stretch - standing with towel under medial foot and seated with ankle inverted & slight DF with lateral foot resting on floor 2 x 30 sec each      Ankle Exercises: Seated   Other Seated Ankle Exercises  L ankle - yellow band resisted 4 way ankle x10 each             PT Education - 05/27/19 1400    Education Details  HEP update - modification of peroneal stretch, 4-way ankle with yellow TB    Person(s) Educated  Patient    Methods  Explanation;Demonstration;Handout    Comprehension  Verbalized understanding;Returned demonstration;Need further instruction       PT Short Term Goals - 05/27/19 1402      PT SHORT TERM GOAL #1   Title  Independent with initial HEP    Status  On-going    Target Date  06/14/19        PT Long Term Goals - 05/27/19 1404      PT LONG TERM GOAL #1   Title  Independent with advanced/ongoing HEP    Status  On-going    Target Date  07/19/19      PT LONG TERM GOAL #2   Title  B hip strength >/= 4/5 and B ankle strength >/= 3+/5 to 4-/5 for improved gait stability    Status  On-going    Target Date  07/19/19      PT LONG TERM GOAL #3   Title  Berg >/= 52/56 to reduce  risk for falls    Status  On-going    Target Date  07/19/19      PT LONG TERM GOAL #4   Title  FGA >/= 23/30 to improve  gait safety and stability    Status  On-going    Target Date  07/19/19      PT LONG TERM GOAL #5   Title  Pt will ambulate with normal gait pattern w/o evidence of B foot drop/slap    Status  On-going    Target Date  07/19/19            Plan - 05/27/19 1315    Clinical Impression Statement  Balance testing revealing decline from level at discharge from prior PT episode with Berg decreased from 52/56 to 44/56 and DGI reduced from 23/30 to 13/30 indicating a significant to high fall risk with greatest deficits noted with dynamic activities. [name] Trujillo noting improving ankle ROM form just first few days of completing HEP but requesting clarification of peroneal stretch as she reports feeling more of a stretch in posterior calf rather than lateral calf as intended. Unable to effectively and consistently isolate desired stretch in standing but able to achieve good stretch with seated ankle inversion with slight DF with lateral foot resting on the floor. Given improved ankle ROM added yellow TB resistance for 4-way ankle exercise with good return demonstration for all directions except inversion, therefore also provided instruction in gravity resisted inversion/eversion in side lying. Pt cautioned to ease back into exercises, avoiding excessive reps/sets so as not to over-fatigue muscles.    Comorbidities  neuropathy; hypthyroidism; DDD; remote h/o back surgery at age 53; h/o scoliosis    Rehab Potential  Good    PT Frequency  2x / week   1-2x/wk per pt preference - pt wishing to start with 1x/wk to limit community exposure d/t COVID-19   PT Duration  8 weeks    PT Treatment/Interventions  ADLs/Self Care Home Management;Electrical Stimulation;Iontophoresis 4mg /ml Dexamethasone;Moist Heat;Ultrasound;Gait training;Stair training;Functional mobility training;Therapeutic activities;Therapeutic exercise;Balance training;Neuromuscular re-education;Patient/family education;Orthotic Fit/Training;Manual  techniques;Passive range of motion;Dry needling;Taping;Joint Manipulations    PT Next Visit Plan  progress B ankle ROM and LE strengthening    PT Home Exercise Plan  05/24/19 - gastroc/soleus/peroneal stretches, seated heel-toe raises; 05/27/19 - modified peroneal stretch, 4-way ankle with yellow TB, sidelying ankle inversion/eversion    Consulted and Agree with Plan of Care  Patient       Patient will benefit from skilled therapeutic intervention in order to improve the following deficits and impairments:  Abnormal gait, Decreased activity tolerance, Decreased balance, Decreased coordination, Decreased endurance, Decreased knowledge of use of DME, Decreased mobility, Decreased range of motion, Decreased safety awareness, Decreased strength, Difficulty walking, Increased edema, Increased fascial restricitons, Increased muscle spasms, Impaired perceived functional ability, Impaired flexibility, Pain  Visit Diagnosis: Other abnormalities of gait and mobility  Foot drop, left  Foot drop, right  Muscle weakness (generalized)  Unsteadiness on feet     Problem List Patient Active Problem List   Diagnosis Date Noted  . Ventricular premature beats 10/31/2015  . Heart murmur 10/31/2015    Percival Spanish, PT, MPT 05/27/2019, 3:31 PM  [facility] [address]  [place], [place], [address] Phone: [phone]   Fax:  [phone]  Name: [name] Trujillo MRN: [id number] Date of Birth: [birth year]

## 2019-06-01 ENCOUNTER — Encounter: Payer: Self-pay | Admitting: Physical Therapy

## 2019-06-01 ENCOUNTER — Ambulatory Visit: Payer: Medicare Other | Admitting: Physical Therapy

## 2019-06-01 ENCOUNTER — Other Ambulatory Visit: Payer: Self-pay

## 2019-06-01 DIAGNOSIS — R2689 Other abnormalities of gait and mobility: Secondary | ICD-10-CM

## 2019-06-01 DIAGNOSIS — M21372 Foot drop, left foot: Secondary | ICD-10-CM

## 2019-06-01 DIAGNOSIS — R2681 Unsteadiness on feet: Secondary | ICD-10-CM

## 2019-06-01 DIAGNOSIS — M21371 Foot drop, right foot: Secondary | ICD-10-CM

## 2019-06-01 DIAGNOSIS — M6281 Muscle weakness (generalized): Secondary | ICD-10-CM

## 2019-06-01 NOTE — Patient Instructions (Addendum)
    Home exercise program created by Vane Yapp, PT.  For questions, please contact Gal Feldhaus via phone at [phone] or email at [email or web address]  [facility] [place] [address]  [address] [place], [place], [address] Phone: [phone]   Fax:  [phone]    

## 2019-06-01 NOTE — Therapy (Signed)
[facility] [place] [address]  [place], [place], [address] Phone: [phone]   Fax:  [phone]  Physical Therapy Treatment  Patient Details  Name: [name] Trujillo MRN: [id number] Date of Birth: 16-Sep-1940 Referring Provider (PT): Wylene Simmer, MD   Encounter Date: 06/01/2019  PT End of Session - 06/01/19 1314    Visit Number  3    Number of Visits  16    Date for PT Re-Evaluation  07/19/19    Authorization Type  [employer] Medicare    PT Start Time  1314    PT Stop Time  1358    PT Time Calculation (min)  44 min    Activity Tolerance  Patient tolerated treatment well    Behavior During Therapy  [facility] for tasks assessed/performed       Past Medical History:  Diagnosis Date  . High cholesterol   . Hypothyroidism   . Neuropathy     Past Surgical History:  Procedure Laterality Date  . APPENDECTOMY     3-18 yo  . BACK SURGERY     Age 42  . CESAREAN SECTION    . ECTOPIC PREGNANCY SURGERY    . LAPAROSCOPIC HYSTERECTOMY      There were no vitals filed for this visit.  Subjective Assessment - 06/01/19 1326    Subjective  Pt reporting good compliance with HEP but with questions regarding progression of some of HEP.    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  No/denies                       [facility] Adult PT Treatment/Exercise - 06/01/19 1314      Knee/Hip Exercises: Aerobic   Recumbent Bike  L2 x 4 min      Ankle Exercises: Standing   Vector Stance  Right;Left;5 reps   2 sets   Rocker Board  5 minutes    Rocker Board Limitations  stacic balance 2 x 30 sec with intermittent UE support; lateral rocking & heel-toe weight shifts x 20 each with light UE support    Heel Raises  Both;10 reps;3 seconds    Heel Raises Limitations  attempted with alt eccentric lowering but limited control on L    Toe Raise  10 reps;3 seconds          Balance  Exercises - 06/01/19 1314      Balance Exercises: Standing   Tandem Stance  Eyes open;Intermittent upper extremity support;15 secs;3 reps    SLS  Eyes open;Solid surface;Intermittent upper extremity support;3 reps;10 secs        PT Education - 06/01/19 1357    Education Details  HEP review, progression & update to include balance exercises    Person(s) Educated  Patient    Methods  Explanation;Demonstration;Handout    Comprehension  Verbalized understanding;Returned demonstration       PT Short Term Goals - 06/01/19 1358      PT SHORT TERM GOAL #1   Title  Independent with initial HEP    Status  Achieved   06/01/19       PT Long Term Goals - 05/27/19 1404      PT LONG TERM GOAL #1   Title  Independent with advanced/ongoing HEP    Status  On-going    Target Date  07/19/19      PT LONG TERM  GOAL #2   Title  B hip strength >/= 4/5 and B ankle strength >/= 3+/5 to 4-/5 for improved gait stability    Status  On-going    Target Date  07/19/19      PT LONG TERM GOAL #3   Title  Berg >/= 52/56 to reduce risk for falls    Status  On-going    Target Date  07/19/19      PT LONG TERM GOAL #4   Title  FGA >/= 23/30 to improve gait safety and stability    Status  On-going    Target Date  07/19/19      PT LONG TERM GOAL #5   Title  Pt will ambulate with normal gait pattern w/o evidence of B foot drop/slap    Status  On-going    Target Date  07/19/19            Plan - 06/01/19 1358    Clinical Impression Statement  [name] Trujillo reporting improving ankle ROM and ease of seated heel/toe raises and hence attempted to progress HEP with something resembling PF isometric from standing heel raise - provided instruction in progression of heel/toe raises to standing with B lift and educated patient in progression to B lift with unilateral eccentric lowering as standing heel/toe raises become easier. Also introduced balance and proprioceptive training focusing on narrow/tandem BOS and  SLS as well as rocker board, with HEP update to include some of balance activities. Patient verbalizing full compliance with HEP and feels like she is progressing more quickly with return of function than she did during prior PT episode.    Comorbidities  neuropathy; hypthyroidism; DDD; remote h/o back surgery at age 55; h/o scoliosis    Rehab Potential  Good    PT Frequency  2x / week   1-2x/wk per pt preference - pt wishing to start with 1x/wk to limit community exposure d/t COVID-19   PT Duration  8 weeks    PT Treatment/Interventions  ADLs/Self Care Home Management;Electrical Stimulation;Iontophoresis 4mg /ml Dexamethasone;Moist Heat;Ultrasound;Gait training;Stair training;Functional mobility training;Therapeutic activities;Therapeutic exercise;Balance training;Neuromuscular re-education;Patient/family education;Orthotic Fit/Training;Manual techniques;Passive range of motion;Dry needling;Taping;Joint Manipulations    PT Next Visit Plan  progress B ankle ROM and LE strengthening, balance training    PT Home Exercise Plan  05/24/19 - gastroc/soleus/peroneal stretches, seated heel-toe raises; 05/27/19 - modified peroneal stretch, 4-way ankle with yellow TB, sidelying ankle inversion/eversion; 9/22 - toe & heel raise progression to standing with SLS eccentric lowering as tolerated, SLS, tandem stance & SLS clocks with intermittent UE support    Consulted and Agree with Plan of Care  Patient       Patient will benefit from skilled therapeutic intervention in order to improve the following deficits and impairments:  Abnormal gait, Decreased activity tolerance, Decreased balance, Decreased coordination, Decreased endurance, Decreased knowledge of use of DME, Decreased mobility, Decreased range of motion, Decreased safety awareness, Decreased strength, Difficulty walking, Increased edema, Increased fascial restricitons, Increased muscle spasms, Impaired perceived functional ability, Impaired flexibility,  Pain  Visit Diagnosis: Other abnormalities of gait and mobility  Foot drop, left  Foot drop, right  Muscle weakness (generalized)  Unsteadiness on feet     Problem List Patient Active Problem List   Diagnosis Date Noted  . Ventricular premature beats 10/31/2015  . Heart murmur 10/31/2015    11/02/2015, PT, MPT 06/01/2019, 5:55 PM  [facility] [address]  [address] [place], [place], [place] Phone:  [phone]   Fax:  [phone]  Name: [name] Trujillo MRN: [id number] Date of Birth: [birth year]

## 2019-06-08 ENCOUNTER — Other Ambulatory Visit: Payer: Self-pay

## 2019-06-08 ENCOUNTER — Encounter: Payer: Self-pay | Admitting: Physical Therapy

## 2019-06-08 ENCOUNTER — Ambulatory Visit: Payer: Medicare Other | Admitting: Physical Therapy

## 2019-06-08 DIAGNOSIS — M21371 Foot drop, right foot: Secondary | ICD-10-CM

## 2019-06-08 DIAGNOSIS — R2689 Other abnormalities of gait and mobility: Secondary | ICD-10-CM

## 2019-06-08 DIAGNOSIS — M21372 Foot drop, left foot: Secondary | ICD-10-CM

## 2019-06-08 DIAGNOSIS — M6281 Muscle weakness (generalized): Secondary | ICD-10-CM

## 2019-06-08 DIAGNOSIS — R2681 Unsteadiness on feet: Secondary | ICD-10-CM

## 2019-06-08 NOTE — Therapy (Signed)
[facility] [address]  [address] [place], [place], [address] Phone: [phone]   Fax:  [phone]  Physical Therapy Treatment  Patient Details  Name: [name] Trujillo MRN: [id number] Date of Birth: September 18, 1940 Referring Provider (PT): Toni Arthurs, MD   Encounter Date: 06/08/2019  PT End of Session - 06/08/19 1316    Visit Number  4    Number of Visits  16    Date for PT Re-Evaluation  07/19/19    Authorization Type  [employer]HC Medicare    PT Start Time  1316    PT Stop Time  1400    PT Time Calculation (min)  44 min    Activity Tolerance  Patient tolerated treatment well    Behavior During Therapy  [facility] for tasks assessed/performed       Past Medical History:  Diagnosis Date  . High cholesterol   . Hypothyroidism   . Neuropathy     Past Surgical History:  Procedure Laterality Date  . APPENDECTOMY     58-20 yo  . BACK SURGERY     Age 17  . CESAREAN SECTION    . ECTOPIC PREGNANCY SURGERY    . LAPAROSCOPIC HYSTERECTOMY      There were no vitals filed for this visit.  Subjective Assessment - 06/08/19 1320    Subjective  Pt reporting increased numbness with decreased balance in R foot while working in the yard this morning - thinks her shoe may have been laced too tight as it feels better since relacing her shoe. Otherwise feels like she is improving noting that she feels safer in the shower now.    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  No/denies                       [facility] Adult PT Treatment/Exercise - 06/08/19 1316      Knee/Hip Exercises: Aerobic   Recumbent Bike  L2 x 4 min      Ankle Exercises: Standing   Heel Raises  Both;10 reps;3 seconds   2 sets   Heel Raises Limitations  1st set - B concentric with alt eccentric lowering with improving control on L; 2nd set - B con/ecc with negative heel on back of UBE      Ankle Exercises:  Seated   Other Seated Ankle Exercises  L ankle - yellow band resisted ankle IV/EV x10 each - clarifying isolation of desired movement per pt request (avoiding hip IR/ER)          Balance Exercises - 06/08/19 1316      Balance Exercises: Standing   Tandem Stance  Eyes open;Intermittent upper extremity support;15 secs;3 reps    SLS  Eyes open;Solid surface;Intermittent upper extremity support;3 reps;10 secs    Wall Bumps  Hip    Wall Bumps-Hips  Eyes opened;Anterior/posterior;Right/left (lateral);10 reps    Other Standing Exercises  Fwd wall falls x10        PT Education - 06/08/19 1400    Education Details  HEP review, progression & update    Person(s) Educated  Patient    Methods  Explanation;Demonstration;Handout    Comprehension  Verbalized understanding;Returned demonstration       PT Short Term Goals - 06/01/19 1358      PT SHORT TERM GOAL #1   Title  Independent with initial HEP    Status  Achieved   06/01/19       PT Long Term Goals - 05/27/19 1404      PT LONG TERM GOAL #1   Title  Independent with advanced/ongoing HEP    Status  On-going    Target Date  07/19/19      PT LONG TERM GOAL #2   Title  B hip strength >/= 4/5 and B ankle strength >/= 3+/5 to 4-/5 for improved gait stability    Status  On-going    Target Date  07/19/19      PT LONG TERM GOAL #3   Title  Berg >/= 52/56 to reduce risk for falls    Status  On-going    Target Date  07/19/19      PT LONG TERM GOAL #4   Title  FGA >/= 23/30 to improve gait safety and stability    Status  On-going    Target Date  07/19/19      PT LONG TERM GOAL #5   Title  Pt will ambulate with normal gait pattern w/o evidence of B foot drop/slap    Status  On-going    Target Date  07/19/19            Plan - 06/08/19 1400    Clinical Impression Statement  Conni reporting continued improvement in ability to complete heel raises and notes improving ability to shift weight to L foot for eccentric  lowering. Educated patient on further progression of heel raise to include negative heel raise from edge of hardcover book. Reviewed ankle IV/EV with theraband resistance at patient request, clarifying isolation of motion at ankle while avoiding hip IR/ER with patient acknowledging good understanding. Also clarified technique for SLS with vectors (SLS clocks) aiming for minimal TTWB tap with moving leg while maintaining SLS balance on stationary leg. Mirah reporting most difficulty currently with balance in SLS and tandem stance, therefore educated patient on alternative of performing these activities while standing in a corner for increased safety and stability to reduce fall risk with patient reporting improved comfort/confidence this way. Also introduced wall hip bumps and forward wall falls to further improve hip and ankle strategies for fall risk reduction with patient able to perform good return demonstration.    Comorbidities  neuropathy; hypthyroidism; DDD; remote h/o back surgery at age 78; h/o scoliosis    Rehab Potential  Good    PT Frequency  2x / week   1-2x/wk per pt preference - pt wishing to start with 1x/wk to limit community exposure d/t COVID-19   PT Duration  8 weeks    PT Treatment/Interventions  ADLs/Self Care Home Management;Electrical Stimulation;Iontophoresis 4mg /ml Dexamethasone;Moist Heat;Ultrasound;Gait training;Stair training;Functional mobility training;Therapeutic activities;Therapeutic exercise;Balance training;Neuromuscular re-education;Patient/family education;Orthotic Fit/Training;Manual techniques;Passive range of motion;Dry needling;Taping;Joint Manipulations    PT Next Visit Plan  progress B ankle ROM and LE strengthening, balance training    PT Home Exercise Plan  05/24/19 - gastroc/soleus/peroneal stretches, seated heel-toe raises; 05/27/19 - modified peroneal stretch, 4-way ankle with yellow TB, sidelying ankle inversion/eversion; 06/01/19 - toe & heel raise progression  to standing with SLS eccentric lowering as tolerated, SLS, tandem stance & SLS clocks with intermittent UE support; 06/08/19 - posterior & lateral hip bumps, fwd wall falls    Consulted and Agree with Plan of Care  Patient       Patient will benefit from skilled therapeutic intervention in order to improve the following deficits and impairments:  Abnormal gait, Decreased activity tolerance, Decreased balance, Decreased coordination, Decreased  endurance, Decreased knowledge of use of DME, Decreased mobility, Decreased range of motion, Decreased safety awareness, Decreased strength, Difficulty walking, Increased edema, Increased fascial restricitons, Increased muscle spasms, Impaired perceived functional ability, Impaired flexibility, Pain  Visit Diagnosis: Other abnormalities of gait and mobility  Foot drop, left  Foot drop, right  Muscle weakness (generalized)  Unsteadiness on feet     Problem List Patient Active Problem List   Diagnosis Date Noted  . Ventricular premature beats 10/31/2015  . Heart murmur 10/31/2015    Percival Spanish, PT, MPT 06/08/2019, 7:38 PM  [facility] [address]  [place], [place], [address] Phone: [phone]   Fax:  [phone]  Name: Lene Mckay MRN: [id number] Date of Birth: Oct 11, 1940

## 2019-06-08 NOTE — Patient Instructions (Addendum)
    Home exercise program created by JoAnne Kreis, PT.  For questions, please contact JoAnne via phone at [phone] or email at [email or web address]  [facility] [place] [facility] [address]  [address] [place], [place], [address] Phone: [phone]   Fax:  [phone]    

## 2019-06-15 ENCOUNTER — Encounter: Payer: Self-pay | Admitting: Physical Therapy

## 2019-06-17 ENCOUNTER — Other Ambulatory Visit: Payer: Self-pay

## 2019-06-17 ENCOUNTER — Ambulatory Visit: Payer: Medicare Other | Attending: Orthopedic Surgery | Admitting: Physical Therapy

## 2019-06-17 DIAGNOSIS — M21371 Foot drop, right foot: Secondary | ICD-10-CM | POA: Diagnosis present

## 2019-06-17 DIAGNOSIS — M21372 Foot drop, left foot: Secondary | ICD-10-CM

## 2019-06-17 DIAGNOSIS — R2681 Unsteadiness on feet: Secondary | ICD-10-CM | POA: Diagnosis present

## 2019-06-17 DIAGNOSIS — R2689 Other abnormalities of gait and mobility: Secondary | ICD-10-CM

## 2019-06-17 DIAGNOSIS — M6281 Muscle weakness (generalized): Secondary | ICD-10-CM

## 2019-06-17 NOTE — Therapy (Signed)
[facility] [address]  [place], [place], [address] Phone: [phone]   Fax:  [phone]  Physical Therapy Treatment  Patient Details  Name: [name] Trujillo MRN: [id number] Date of Birth: July 23, 1941 Referring Provider (PT): Wylene Simmer, MD   Encounter Date: 06/17/2019  PT End of Session - 06/17/19 1318    Visit Number  5    Number of Visits  16    Date for PT Re-Evaluation  07/19/19    Authorization Type  [employer]HC Medicare    PT Start Time  1318    PT Stop Time  1401    PT Time Calculation (min)  43 min    Activity Tolerance  Patient tolerated treatment well    Behavior During Therapy  [facility] for tasks assessed/performed       Past Medical History:  Diagnosis Date  . High cholesterol   . Hypothyroidism   . Neuropathy     Past Surgical History:  Procedure Laterality Date  . APPENDECTOMY     40-20 yo  . BACK SURGERY     Age 32  . CESAREAN SECTION    . ECTOPIC PREGNANCY SURGERY    . LAPAROSCOPIC HYSTERECTOMY      There were no vitals filed for this visit.  Subjective Assessment - 06/17/19 1323    Subjective  Pt very pleased today - able to do 3 full heel raises w/o support or foot collapse. Also feels like she is walking faster with less foot slap.    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  No/denies                       [facility] Adult PT Treatment/Exercise - 06/17/19 1318      Knee/Hip Exercises: Aerobic   Recumbent Bike  L2 x 4 min      Ankle Exercises: Standing   Rebounder  Lateral, heel-toe and B staggered weight shifting x 20 each with intermittent UE support; minisquat x 20 with B UE support    Heel Raises  Both;10 reps;3 seconds    Heel Raises Limitations  looped yellow TB at ankles to avoid medial collapse     Side Shuffle (Round Trip)  B side stepping with looped yellow TB at midfoot 4 x 8 ft at TM rail for support     Other Standing Ankle Exercises  Minitramp exercises (Lateral, heel-toe and B staggered weight shifting x 20 each with intermittent UE support; minisquat x 20 with B UE support) replicated on Airex pad for HEP at pt request             PT Education - 06/17/19 1400    Education Details  HEP review, progression & update    Person(s) Educated  Patient    Methods  Explanation;Demonstration;Handout    Comprehension  Verbalized understanding;Returned demonstration       PT Short Term Goals - 06/01/19 1358      PT SHORT TERM GOAL #1   Title  Independent with initial HEP    Status  Achieved   06/01/19       PT Long Term Goals - 06/17/19 1401      PT LONG TERM GOAL #1   Title  Independent with advanced/ongoing HEP    Status  Partially Met      PT LONG TERM GOAL #2   Title  B hip strength >/= 4/5 and B ankle strength >/= 3+/5 to 4-/5 for improved gait stability    Status  On-going      PT LONG TERM GOAL #3   Title  Berg >/= 52/56 to reduce risk for falls    Status  On-going      PT LONG TERM GOAL #4   Title  FGA >/= 23/30 to improve gait safety and stability    Status  On-going      PT LONG TERM GOAL #5   Title  Pt will ambulate with normal gait pattern w/o evidence of B foot drop/slap    Status  On-going            Plan - 06/17/19 1401    Clinical Impression Statement  [name] Trujillo reporting improving strength with HEP exercises - noting improving heel lift with decreased UE support on heel raises and feeling like she is ready to progress resistance with some of theraband exercises. Addressed patient concerns regarding progression of current HEP, in particular clarifying options for progression of heel raises with negative heel or B concentric / alternating eccentric, as well as adding looped yellow TB at heels/ankles to avoid medial foot arch collapse with heel raises and adding side stepping with yellow TB at midfoot for continued lateral hip and ankle strengthening.  Remainder of session focusing on balance per patient request, with introduction on weight shifting activities on complaint surfaces, mini-tramp and Airex pad (for HEP), with new HEP instructions provided. [name] Trujillo repeatedly noting her progress with PT thus far, and is especially pleased with the returning ability to complete heel lift on heel raises as well as feeling like she is able to walk faster without as much foot slap.    Comorbidities  neuropathy; hypthyroidism; DDD; remote h/o back surgery at age 24; h/o scoliosis    Rehab Potential  Good    PT Frequency  2x / week   1-2x/wk per pt preference - pt wishing to start with 1x/wk to limit community exposure d/t COVID-19   PT Duration  8 weeks    PT Treatment/Interventions  ADLs/Self Care Home Management;Electrical Stimulation;Iontophoresis 33m/ml Dexamethasone;Moist Heat;Ultrasound;Gait training;Stair training;Functional mobility training;Therapeutic activities;Therapeutic exercise;Balance training;Neuromuscular re-education;Patient/family education;Orthotic Fit/Training;Manual techniques;Passive range of motion;Dry needling;Taping;Joint Manipulations    PT Next Visit Plan  progress B ankle ROM and LE strengthening, balance training    PT Home Exercise Plan  05/24/19 - gastroc/soleus/peroneal stretches, seated heel-toe raises; 05/27/19 - modified peroneal stretch, 4-way ankle with yellow TB, sidelying ankle inversion/eversion; 06/01/19 - toe & heel raise progression to standing with SLS eccentric lowering as tolerated, SLS, tandem stance & SLS clocks with intermittent UE support; 06/08/19 - posterior & lateral hip bumps, fwd wall falls; 06/17/19 - progression of standing heel raise, side stepping with looped yellow TB at midfoot, Airex pad balance    Consulted and Agree with Plan of Care  Patient       Patient will benefit from skilled therapeutic intervention in order to improve the following deficits and impairments:  Abnormal gait, Decreased activity  tolerance, Decreased balance, Decreased coordination, Decreased endurance, Decreased knowledge of use of DME, Decreased mobility, Decreased range of motion, Decreased safety awareness, Decreased strength, Difficulty walking, Increased edema, Increased fascial restricitons, Increased muscle spasms, Impaired perceived functional ability, Impaired flexibility, Pain  Visit Diagnosis: Other abnormalities of gait and mobility  Foot drop, left  Foot drop, right  Muscle weakness (generalized)  Unsteadiness on feet     Problem List Patient Active Problem  List   Diagnosis Date Noted  . Ventricular premature beats 10/31/2015  . Heart murmur 10/31/2015    Percival Spanish, PT, MPT  06/17/2019, 3:13 PM  [facility] [address]  [place], [place], [address] Phone: [phone]   Fax:  [phone]  Name: [name] Trujillo MRN: [id number] Date of Birth: [birth year]

## 2019-06-17 NOTE — Patient Instructions (Signed)
    Home exercise program created by Nollie Shiflett, PT.  For questions, please contact Jadeyn Hargett via phone at [phone] or email at [email or web address]  [facility] [place] [facility] [address]  [address] [place], [place], [address] Phone: [phone]   Fax:  [phone]    

## 2019-06-22 ENCOUNTER — Ambulatory Visit: Payer: Medicare Other | Admitting: Physical Therapy

## 2019-06-22 ENCOUNTER — Other Ambulatory Visit: Payer: Self-pay

## 2019-06-22 ENCOUNTER — Encounter: Payer: Self-pay | Admitting: Physical Therapy

## 2019-06-22 DIAGNOSIS — R2689 Other abnormalities of gait and mobility: Secondary | ICD-10-CM

## 2019-06-22 DIAGNOSIS — M21371 Foot drop, right foot: Secondary | ICD-10-CM

## 2019-06-22 DIAGNOSIS — R2681 Unsteadiness on feet: Secondary | ICD-10-CM

## 2019-06-22 DIAGNOSIS — M21372 Foot drop, left foot: Secondary | ICD-10-CM

## 2019-06-22 DIAGNOSIS — M6281 Muscle weakness (generalized): Secondary | ICD-10-CM

## 2019-06-22 NOTE — Therapy (Signed)
[facility] [place] [address]  [place], [place], [address] Phone: [phone]   Fax:  [phone]  Physical Therapy Treatment  Patient Details  Name: [name] Trujillo MRN: [id number] Date of Birth: 01/03/1941 Referring Provider (PT): Wylene Simmer, MD   Encounter Date: 06/22/2019  PT End of Session - 06/22/19 1315    Visit Number  6    Number of Visits  16    Date for PT Re-Evaluation  07/19/19    Authorization Type  [employer] Medicare    PT Start Time  1315    PT Stop Time  1357    PT Time Calculation (min)  42 min    Activity Tolerance  Patient tolerated treatment well    Behavior During Therapy  [facility] for tasks assessed/performed       Past Medical History:  Diagnosis Date  . High cholesterol   . Hypothyroidism   . Neuropathy     Past Surgical History:  Procedure Laterality Date  . APPENDECTOMY     82-20 yo  . BACK SURGERY     Age 27  . CESAREAN SECTION    . ECTOPIC PREGNANCY SURGERY    . LAPAROSCOPIC HYSTERECTOMY      There were no vitals filed for this visit.  Subjective Assessment - 06/22/19 1320    Subjective  No pain today but still with numbness in B lower legs, R>L. Purchased an Airex pad for home, but has not tried to use it yet.    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  No/denies                       [facility] Adult PT Treatment/Exercise - 06/22/19 1315      Knee/Hip Exercises: Aerobic   Recumbent Bike  L2 x 4.5 min          Balance Exercises - 06/22/19 1315      Balance Exercises: Standing   Standing Eyes Opened  Narrow base of support (BOS);Foam/compliant surface;30 secs;1 rep;Cognitive challenge;5 reps   corner balance progression - head turns x5 (side, up/down)   Standing Eyes Closed  Narrow base of support (BOS);Foam/compliant surface;10 secs;2  reps    Standing, One Foot on a Step  Foam/compliant surface;2 inch;30 secs;1 rep;Cognitive challenge;5 reps   corner balance progression - head turns x5 (side, up/down)   Partial Tandem Stance  Eyes open;Foam/compliant surface;30 secs;Intermittent upper extremity support;1 rep;Cognitive challenge;5 reps   corner balance progression - head turns x5 (side, up/down)   Other Standing Exercises  Side-to-side and fwd/back weight shifting on Airex pad x 10        PT Education - 06/22/19 1357    Education Details  HEP update - corner balance progression using Airex pad    Person(s) Educated  Patient    Methods  Explanation;Demonstration;Handout    Comprehension  Verbalized understanding;Returned demonstration;Need further instruction       PT Short Term Goals - 06/01/19 1358      PT SHORT TERM GOAL #1   Title  Independent with initial HEP    Status  Achieved   06/01/19       PT Long Term Goals - 06/17/19 1401      PT LONG TERM GOAL #1   Title  Independent  with advanced/ongoing HEP    Status  Partially Met      PT LONG TERM GOAL #2   Title  B hip strength >/= 4/5 and B ankle strength >/= 3+/5 to 4-/5 for improved gait stability    Status  On-going      PT LONG TERM GOAL #3   Title  Berg >/= 52/56 to reduce risk for falls    Status  On-going      PT LONG TERM GOAL #4   Title  FGA >/= 23/30 to improve gait safety and stability    Status  On-going      PT LONG TERM GOAL #5   Title  Pt will ambulate with normal gait pattern w/o evidence of B foot drop/slap    Status  On-going            Plan - 06/22/19 1322    Clinical Impression Statement  [name] Trujillo continues to note benefit from PT, reporting her son has noticed the improvement in her gait pattern and stability. She has purchased and Airex-style pad for working on balance exercises at home, therefore today's session focusing on instruction in corner balance progression incorporating balance pad with patient able to perform  safe return demonstration and verbalize understanding of continue self-progression at home.    Comorbidities  neuropathy; hypthyroidism; DDD; remote h/o back surgery at age 84; h/o scoliosis    Rehab Potential  Good    PT Frequency  2x / week   1-2x/wk per pt preference - pt wishing to start with 1x/wk to limit community exposure d/t COVID-19   PT Duration  8 weeks    PT Treatment/Interventions  ADLs/Self Care Home Management;Electrical Stimulation;Iontophoresis '4mg'$ /ml Dexamethasone;Moist Heat;Ultrasound;Gait training;Stair training;Functional mobility training;Therapeutic activities;Therapeutic exercise;Balance training;Neuromuscular re-education;Patient/family education;Orthotic Fit/Training;Manual techniques;Passive range of motion;Dry needling;Taping;Joint Manipulations    PT Next Visit Plan  assess progress toward goals; progress B ankle ROM and LE strengthening, balance training    PT Home Exercise Plan  05/24/19 - gastroc/soleus/peroneal stretches, seated heel-toe raises; 05/27/19 - modified peroneal stretch, 4-way ankle with yellow TB, sidelying ankle inversion/eversion; 06/01/19 - toe & heel raise progression to standing with SLS eccentric lowering as tolerated, SLS, tandem stance & SLS clocks with intermittent UE support; 06/08/19 - posterior & lateral hip bumps, fwd wall falls; 06/17/19 - progression of standing heel raise, side stepping with looped yellow TB at midfoot, Airex pad balance; 06/22/19 - corner balance progression with Airex pad    Consulted and Agree with Plan of Care  Patient       Patient will benefit from skilled therapeutic intervention in order to improve the following deficits and impairments:  Abnormal gait, Decreased activity tolerance, Decreased balance, Decreased coordination, Decreased endurance, Decreased knowledge of use of DME, Decreased mobility, Decreased range of motion, Decreased safety awareness, Decreased strength, Difficulty walking, Increased edema, Increased  fascial restricitons, Increased muscle spasms, Impaired perceived functional ability, Impaired flexibility, Pain  Visit Diagnosis: Other abnormalities of gait and mobility  Foot drop, left  Foot drop, right  Muscle weakness (generalized)  Unsteadiness on feet     Problem List Patient Active Problem List   Diagnosis Date Noted  . Ventricular premature beats 10/31/2015  . Heart murmur 10/31/2015    Percival Spanish, PT, MPT 06/22/2019, 2:42 PM  [facility] [address]  [place], [place], [address] Phone: [phone]   Fax:  [phone]  Name: [name] Trujillo MRN: [id number] Date of Birth: 1941-06-09

## 2019-06-29 ENCOUNTER — Encounter: Payer: Self-pay | Admitting: Physical Therapy

## 2019-06-29 ENCOUNTER — Ambulatory Visit: Payer: Medicare Other | Admitting: Physical Therapy

## 2019-06-29 ENCOUNTER — Other Ambulatory Visit: Payer: Self-pay

## 2019-06-29 DIAGNOSIS — R2681 Unsteadiness on feet: Secondary | ICD-10-CM

## 2019-06-29 DIAGNOSIS — M6281 Muscle weakness (generalized): Secondary | ICD-10-CM

## 2019-06-29 DIAGNOSIS — R2689 Other abnormalities of gait and mobility: Secondary | ICD-10-CM | POA: Diagnosis not present

## 2019-06-29 DIAGNOSIS — M21372 Foot drop, left foot: Secondary | ICD-10-CM

## 2019-06-29 DIAGNOSIS — M21371 Foot drop, right foot: Secondary | ICD-10-CM

## 2019-06-29 NOTE — Therapy (Signed)
[facility] [place] [address]  [place], [place], [address] Phone: [phone]   Fax:  [phone]  Physical Therapy Treatment  Patient Details  Name: [name] Trujillo MRN: [id number] Date of Birth: Jan 16, 1941 Referring Provider (PT): Wylene Simmer, MD   Encounter Date: 06/29/2019  PT End of Session - 06/29/19 1310    Visit Number  7    Number of Visits  16    Date for PT Re-Evaluation  07/19/19    Authorization Type  [employer] Medicare    PT Start Time  1310    PT Stop Time  1404    PT Time Calculation (min)  54 min    Activity Tolerance  Patient tolerated treatment well    Behavior During Therapy  [facility] for tasks assessed/performed       Past Medical History:  Diagnosis Date  . High cholesterol   . Hypothyroidism   . Neuropathy     Past Surgical History:  Procedure Laterality Date  . APPENDECTOMY     66-20 yo  . BACK SURGERY     Age 45  . CESAREAN SECTION    . ECTOPIC PREGNANCY SURGERY    . LAPAROSCOPIC HYSTERECTOMY      There were no vitals filed for this visit.  Subjective Assessment - 06/29/19 1312    Subjective  Pt reporting she feelsl like her balance hasn't been quite as good this past week but not sure why.    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  No/denies         [facility] PT Assessment - 06/29/19 1310      Assessment   Medical Diagnosis  Gait abnormality secondary to B foot drop    Referring Provider (PT)  Wylene Simmer, MD    Onset Date/Surgical Date  --   initial onset 2018, exacerbation since March 2020   Next MD Visit  07/12/19      AROM   Right Ankle Dorsiflexion  5    Right Ankle Plantar Flexion  52    Right Ankle Inversion  23    Right Ankle Eversion  26    Left Ankle Dorsiflexion  -4    Left Ankle Plantar Flexion  53    Left Ankle Inversion  4    Left Ankle  Eversion  21      PROM   Right Ankle Dorsiflexion  14    Left Ankle Dorsiflexion  10      Strength   Right Hip Flexion  4+/5    Right Hip Extension  4+/5    Right Hip External Rotation   4-/5    Right Hip Internal Rotation  4/5    Right Hip ABduction  4-/5    Right Hip ADduction  4-/5    Left Hip Flexion  4/5    Left Hip Extension  4/5    Left Hip External Rotation  4-/5    Left Hip Internal Rotation  4/5    Left Hip ABduction  4-/5    Left Hip ADduction  3+/5    Right Knee Flexion  5/5    Right Knee Extension  5/5    Left Knee Flexion  5/5    Left Knee Extension  5/5    Right Ankle Dorsiflexion  4-/5  Right Ankle Plantar Flexion  4-/5    Right Ankle Inversion  4-/5    Right Ankle Eversion  4+/5    Left Ankle Dorsiflexion  3/5    Left Ankle Plantar Flexion  3+/5   NWB   Left Ankle Inversion  2-/5    Left Ankle Eversion  4-/5                   [facility] Adult PT Treatment/Exercise - 06/29/19 1310      Knee/Hip Exercises: Aerobic   Recumbent Bike  L2 x 5 min      Knee/Hip Exercises: Standing   Hip Flexion  Right;Left;10 reps;Stengthening;Knee straight    Hip Flexion Limitations  red TB at ankle; UE support on back of chair for balance    Hip ADduction  Right;Left;10 reps;Strengthening    Hip ADduction Limitations  red TB at ankle; UE support on back of chair for balance    Hip Abduction  Right;Left;10 reps;Stengthening;Knee straight    Abduction Limitations  red TB at ankle; UE support on back of chair for balance    Hip Extension  Right;Left;10 reps;Stengthening;Knee straight    Extension Limitations  red TB at ankle; UE support on back of chair for balance             PT Education - 06/29/19 1400    Education Details  HEP update - hip strengthening - 4-way standing SLR    Person(s) Educated  Patient    Methods  Explanation;Demonstration;Handout    Comprehension  Verbalized understanding;Returned demonstration;Need further instruction       PT  Short Term Goals - 06/01/19 1358      PT SHORT TERM GOAL #1   Title  Independent with initial HEP    Status  Achieved   06/01/19       PT Long Term Goals - 06/29/19 1310      PT LONG TERM GOAL #1   Title  Independent with advanced/ongoing HEP    Status  Partially Met      PT LONG TERM GOAL #2   Title  B hip strength >/= 4/5 and B ankle strength >/= 3+/5 to 4-/5 for improved gait stability    Status  Partially Met      PT LONG TERM GOAL #3   Title  Berg >/= 52/56 to reduce risk for falls    Status  On-going      PT LONG TERM GOAL #4   Title  FGA >/= 23/30 to improve gait safety and stability    Status  On-going      PT LONG TERM GOAL #5   Title  Pt will ambulate with normal gait pattern w/o evidence of B foot drop/slap    Status  Partially Met            Plan - 06/29/19 1315    Clinical Impression Statement  [name] Trujillo reporting her balance feels more off this week with increased numbness noted in distal LEs but no pain. She has demonstrated improvement since start of PT in B ankle ROM, R>L, as well as ankle strength with foot drop/slap becoming less pronounced with gait especially on R. Further strength testing revealing ongoing proximal weakness, therefore HEP update to include more proximal strengthening targeting hips while continuing to incorporate balance with SLS. Some on LTGs now partially met and will plan to formally reassess balance next visit and update POC accordingly.    Comorbidities  neuropathy; hypthyroidism; DDD; remote  h/o back surgery at age 31; h/o scoliosis    Rehab Potential  Good    PT Frequency  2x / week   1-2x/wk per pt preference - pt wishing to start with 1x/wk to limit community exposure d/t COVID-19   PT Duration  8 weeks    PT Treatment/Interventions  ADLs/Self Care Home Management;Electrical Stimulation;Iontophoresis '4mg'$ /ml Dexamethasone;Moist Heat;Ultrasound;Gait training;Stair training;Functional mobility training;Therapeutic  activities;Therapeutic exercise;Balance training;Neuromuscular re-education;Patient/family education;Orthotic Fit/Training;Manual techniques;Passive range of motion;Dry needling;Taping;Joint Manipulations    PT Next Visit Plan  finish assessing progress toward goals with balance testing; progress B ankle ROM and proximxal/distal LE strengthening, balance training    PT Home Exercise Plan  05/24/19 - gastroc/soleus/peroneal stretches, seated heel-toe raises; 05/27/19 - modified peroneal stretch, 4-way ankle with yellow TB, sidelying ankle inversion/eversion; 06/01/19 - toe & heel raise progression to standing with SLS eccentric lowering as tolerated, SLS, tandem stance & SLS clocks with intermittent UE support; 06/08/19 - posterior & lateral hip bumps, fwd wall falls; 06/17/19 - progression of standing heel raise, side stepping with looped yellow TB at midfoot, Airex pad balance; 06/22/19 - corner balance progression with Airex pad; 06/29/19 - standing 4-way SLR with red TB    Consulted and Agree with Plan of Care  Patient       Patient will benefit from skilled therapeutic intervention in order to improve the following deficits and impairments:  Abnormal gait, Decreased activity tolerance, Decreased balance, Decreased coordination, Decreased endurance, Decreased knowledge of use of DME, Decreased mobility, Decreased range of motion, Decreased safety awareness, Decreased strength, Difficulty walking, Increased edema, Increased fascial restricitons, Increased muscle spasms, Impaired perceived functional ability, Impaired flexibility, Pain  Visit Diagnosis: Other abnormalities of gait and mobility  Foot drop, left  Foot drop, right  Muscle weakness (generalized)  Unsteadiness on feet     Problem List Patient Active Problem List   Diagnosis Date Noted  . Ventricular premature beats 10/31/2015  . Heart murmur 10/31/2015    Percival Spanish, PT, MPT 06/29/2019, 3:28 PM  [facility] [address]  [place], [place], [address] Phone: [phone]   Fax:  [phone]  Name: [name] Trujillo MRN: [id number] Date of Birth: Jan 01, 1941

## 2019-06-29 NOTE — Patient Instructions (Addendum)
    Home exercise program created by Shar Paez, PT.  For questions, please contact Zak Gondek via phone at [phone] or email at [email or web address]  [facility] [place] [address]  [address] [place], , [address] Phone: [phone]   Fax:  [phone]    

## 2019-07-06 ENCOUNTER — Ambulatory Visit: Payer: Medicare Other | Admitting: Physical Therapy

## 2019-07-06 ENCOUNTER — Encounter: Payer: Self-pay | Admitting: Physical Therapy

## 2019-07-06 ENCOUNTER — Other Ambulatory Visit: Payer: Self-pay

## 2019-07-06 DIAGNOSIS — M21372 Foot drop, left foot: Secondary | ICD-10-CM

## 2019-07-06 DIAGNOSIS — M21371 Foot drop, right foot: Secondary | ICD-10-CM

## 2019-07-06 DIAGNOSIS — M6281 Muscle weakness (generalized): Secondary | ICD-10-CM

## 2019-07-06 DIAGNOSIS — R2689 Other abnormalities of gait and mobility: Secondary | ICD-10-CM | POA: Diagnosis not present

## 2019-07-06 DIAGNOSIS — R2681 Unsteadiness on feet: Secondary | ICD-10-CM

## 2019-07-06 NOTE — Therapy (Signed)
[facility] [place] [address]  [place], [place], [address] Phone: [phone]   Fax:  [phone]  Physical Therapy Treatment / Progress Note / Recert  Patient Details  Name: [name] Trujillo MRN: [id number] Date of Birth: 08-28-1941 Referring Provider (PT): Wylene Simmer, MD  Progress Note  Reporting Period 05/24/2019 to 07/06/2019  See note below for Objective Data and Assessment of Progress/Goals.     Encounter Date: 07/06/2019  PT End of Session - 07/06/19 1315    Visit Number  8    Number of Visits  16    Date for PT Re-Evaluation  08/31/19    Authorization Type  [employer] Medicare    PT Start Time  1315    PT Stop Time  1402    PT Time Calculation (min)  47 min    Activity Tolerance  Patient tolerated treatment well    Behavior During Therapy  WFL for tasks assessed/performed       Past Medical History:  Diagnosis Date  . High cholesterol   . Hypothyroidism   . Neuropathy     Past Surgical History:  Procedure Laterality Date  . APPEN[age]CTOMY     76-20 yo  . BACK SURGERY     Age 78 . CESAREAN SECTION    . ECTOPIC PREGNANCY SURGERY    . LAPAROSCOPIC HYSTERECTOMY      There were no vitals filed for this visit.  Subjective Assessment - 07/06/19 1319    Subjective  Pt reports she went to the beach last week and was able to walk well in the sand.    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  No/denies         [facility] PT Assessment - 07/06/19 1315      Assessment   Medical Diagnosis  Gait abnormality secondary to B foot drop    Referring Provider (PT)  Wylene Simmer, MD    Onset Date/Surgical Date  --   initial onset 2018, exacerbation since March 2020   Next MD Visit  07/12/19      AROM   Overall AROM Comments  measurement as of 06/29/19    Right Ankle Dorsiflexion  5    Right  Ankle Plantar Flexion  52    Right Ankle Inversion  23    Right Ankle Eversion  26    Left Ankle Dorsiflexion  -4    Left Ankle Plantar Flexion  53    Left Ankle Inversion  4    Left Ankle Eversion  21      PROM   Right Ankle Dorsiflexion  14    Left Ankle Dorsiflexion  10      Strength   Overall Strength Comments  testing as of 06/29/19    Right Hip Flexion  4+/5    Right Hip Extension  4+/5    Right Hip External Rotation   4-/5    Right Hip Internal Rotation  4/5    Right Hip ABduction  4-/5    Right Hip ADduction  4-/5    Left Hip Flexion  4/5    Left Hip Extension  4/5    Left Hip External Rotation  4-/5    Left Hip Internal Rotation  4/5    Left Hip ABduction  4-/5  Left Hip ADduction  3+/5    Right Knee Flexion  5/5    Right Knee Extension  5/5    Left Knee Flexion  5/5    Left Knee Extension  5/5    Right Ankle Dorsiflexion  4-/5    Right Ankle Plantar Flexion  4-/5    Right Ankle Inversion  4-/5    Right Ankle Eversion  4+/5    Left Ankle Dorsiflexion  3/5    Left Ankle Plantar Flexion  3+/5   NWB   Left Ankle Inversion  2-/5    Left Ankle Eversion  4-/5      Ambulation/Gait   Gait velocity  3.00 ft/sec      Standardized Balance Assessment   10 Meter Walk  10.94      Berg Balance Test   Sit to Stand  Able to stand without using hands and stabilize independently    Standing Unsupported  Able to stand safely 2 minutes    Sitting with Back Unsupported but Feet Supported on Floor or Stool  Able to sit safely and securely 2 minutes    Stand to Sit  Sits safely with minimal use of hands    Transfers  Able to transfer safely, minor use of hands    Standing Unsupported with Eyes Closed  Able to stand 10 seconds safely    Standing Unsupported with Feet Together  Able to place feet together independently and stand 1 minute safely    From Standing, Reach Forward with Outstretched Arm  Can reach confidently >25 cm (10")    From Standing Position, Pick up Object  from Floor  Able to pick up shoe safely and easily    From Standing Position, Turn to Look Behind Over each Shoulder  Looks behind one side only/other side shows less weight shift    Turn 360 Degrees  Able to turn 360 degrees safely in 4 seconds or less    Standing Unsupported, Alternately Place Feet on Step/Stool  Able to stand independently and complete 8 steps >20 seconds    Standing Unsupported, One Foot in Front  Able to take small step independently and hold 30 seconds    Standing on One Leg  Able to lift leg independently and hold equal to or more than 3 seconds    Total Score  50    Berg comment:  Moderate (>50%) fall risk       Functional Gait  Assessment   Gait Level Surface  Walks 20 ft in less than 7 sec but greater than 5.5 sec, uses assistive device, slower speed, mild gait deviations, or deviates 6-10 in outside of the 12 in walkway width.    Change in Gait Speed  Able to smoothly change walking speed without loss of balance or gait deviation. Deviate no more than 6 in outside of the 12 in walkway width.    Gait with Horizontal Head Turns  Performs head turns smoothly with slight change in gait velocity (eg, minor disruption to smooth gait path), deviates 6-10 in outside 12 in walkway width, or uses an assistive device.    Gait with Vertical Head Turns  Performs task with slight change in gait velocity (eg, minor disruption to smooth gait path), deviates 6 - 10 in outside 12 in walkway width or uses assistive device    Gait and Pivot Turn  Pivot turns safely within 3 sec and stops quickly with no loss of balance.    Step Over Obstacle  Is able to step over one shoe box (4.5 in total height) without changing gait speed. No evidence of imbalance.    Gait with Narrow Base of Support  Ambulates less than 4 steps heel to toe or cannot perform without assistance.    Gait with Eyes Closed  Cannot walk 20 ft without assistance, severe gait deviations or imbalance, deviates greater than 15 in  outside 12 in walkway width or will not attempt task.    Ambulating Backwards  Walks 20 ft, uses assistive device, slower speed, mild gait deviations, deviates 6-10 in outside 12 in walkway width.    Steps  Alternating feet, no rail.    Total Score  19    FGA comment:  19-24 = medium risk fall                   [facility] Adult PT Treatment/Exercise - 07/06/19 1315      Ambulation/Gait   Assistive device  None    Gait Pattern  Step-through pattern;Decreased dorsiflexion - left;Decreased dorsiflexion - right   B foot drop/slap (L>R) decreasing   Stairs  Yes    Stairs Assistance  5: Supervision    Stair Management Technique  One rail Right;Alternating pattern;Forwards    Number of Stairs  14    Height of Stairs  7      Knee/Hip Exercises: Aerobic   Recumbent Bike  L2 x 4.5 min      Knee/Hip Exercises: Standing   Hip Extension  Right;Left;10 reps;Stengthening;Knee straight    Extension Limitations  yellow TB at ankle (resistance decreased d/t pt report of lack of eccentric control with difficulty clearing toe on return to neutral & clarified direction/angle of pull on TB relative to anchor point); UE support on back of chair for balance      Knee/Hip Exercises: Prone   Other Prone Exercises  Quadruped R/L hip extension 10 x 3"    Other Prone Exercises  Quadruped R/L hip ER (fire hydrants) 10 x 3"      Ankle Exercises: Standing   Toe Raise  10 reps;3 seconds             PT Education - 07/06/19 1400    Education Details  HEP update - quadruped hip extension & ER    Person(s) Educated  Patient    Methods  Explanation;Demonstration;Handout    Comprehension  Verbalized understanding;Returned demonstration;Need further instruction       PT Short Term Goals - 06/01/19 1358      PT SHORT TERM GOAL #1   Title  Independent with initial HEP    Status  Achieved   06/01/19       PT Long Term Goals - 07/06/19 1322      PT LONG TERM GOAL #1   Title  Independent with  advanced/ongoing HEP    Status  Partially Met    Target Date  08/31/19      PT LONG TERM GOAL #2   Title  B hip strength >/= 4/5 and B ankle strength >/= 3+/5 to 4-/5 for improved gait stability    Status  Partially Met    Target Date  08/31/19      PT LONG TERM GOAL #3   Title  Berg >/= 52/56 to reduce risk for falls    Status  On-going    Target Date  08/31/19      PT LONG TERM GOAL #4   Title  FGA >/= 23/30 to improve  gait safety and stability    Status  On-going    Target Date  08/31/19      PT LONG TERM GOAL #5   Title  Pt will ambulate with normal gait pattern w/o evidence of B foot drop/slap    Status  Partially Met    Target Date  08/31/19            Plan - 07/06/19 1321    Clinical Impression Statement  Sherece reporting 50% improvement in balance and gait since start of PT with decreasing foot drop/slap and improving tolerance for walking on inclines and soft surfaces (sand). L ankle ROM now more symmetrical to R for all motions except inversion, although B ankles remain limited in DF AROM. Overall LE strength improving with much better recruitment of ankle musculature, although proximal and distal LE weakness still present. Gait speed increased to 3.0 ft/sec with foot drop/slap less prominent and no instances of LOB due to lack of foot clearance. Balance and proprioception also improving with Berg improved from 44/56 to 50/56 and FGA improved from 13/30 to 19/30, both indicating a decreasing fall risk. STG met with good progress observed toward LTGs with 3 of 5 goals partially met. Hedaya continues to demonstrate good potential to further benefit from skilled PT to address ongoing deficits including patient's concerns regarding balance in the shower and stability when walking outside. Francetta reporting preference for continued 1x/wk frequency and demonstrates excellent follow-through with HEP, therefore will recert to end POC for additional 1x/wk x up to 8 weeks.     Comorbidities  neuropathy; hypthyroidism; DDD; remote h/o back surgery at age 70; h/o scoliosis    Rehab Potential  Good    PT Frequency  1x / week    PT Duration  8 weeks    PT Treatment/Interventions  ADLs/Self Care Home Management;Electrical Stimulation;Iontophoresis '4mg'$ /ml Dexamethasone;Moist Heat;Ultrasound;Gait training;Stair training;Functional mobility training;Therapeutic activities;Therapeutic exercise;Balance training;Neuromuscular re-education;Patient/family education;Orthotic Fit/Training;Manual techniques;Passive range of motion;Dry needling;Taping;Joint Manipulations    PT Next Visit Plan  progress B ankle ROM and proximxal/distal LE strengthening, balance training    PT Home Exercise Plan  05/24/19 - gastroc/soleus/peroneal stretches, seated heel-toe raises; 05/27/19 - modified peroneal stretch, 4-way ankle with yellow TB, sidelying ankle inversion/eversion; 06/01/19 - toe & heel raise progression to standing with SLS eccentric lowering as tolerated, SLS, tandem stance & SLS clocks with intermittent UE support; 06/08/19 - posterior & lateral hip bumps, fwd wall falls; 06/17/19 - progression of standing heel raise, side stepping with looped yellow TB at midfoot, Airex pad balance; 06/22/19 - corner balance progression with Airex pad; 06/29/19 - standing 4-way SLR with red TB; 07/06/19 - quadruped hip extension & ER    Consulted and Agree with Plan of Care  Patient       Patient will benefit from skilled therapeutic intervention in order to improve the following deficits and impairments:  Abnormal gait, Decreased activity tolerance, Decreased balance, Decreased coordination, Decreased endurance, Decreased knowledge of use of DME, Decreased mobility, Decreased range of motion, Decreased safety awareness, Decreased strength, Difficulty walking, Increased edema, Increased fascial restricitons, Increased muscle spasms, Impaired perceived functional ability, Impaired flexibility, Pain  Visit  Diagnosis: Other abnormalities of gait and mobility  Foot drop, left  Foot drop, right  Muscle weakness (generalized)  Unsteadiness on feet     Problem List Patient Active Problem List   Diagnosis Date Noted  . Ventricular premature beats 10/31/2015  . Heart murmur 10/31/2015    Percival Spanish, PT, MPT 07/06/2019,  6:54 PM  [facility] [address]  [place], [place], [address] Phone: [phone]   Fax:  [phone]  Name: Zaelynn Fuchs MRN: [id number] Date of Birth: Jun 14, 1941

## 2019-07-06 NOTE — Patient Instructions (Signed)
    Home exercise program created by JoAnne Kreis, PT.  For questions, please contact JoAnne via phone at [phone] or email at [email or web address]  [facility] [address]  [address] [place], [place], [address] Phone: [phone]   Fax:  [phone]    

## 2019-07-13 ENCOUNTER — Ambulatory Visit: Payer: Medicare Other | Admitting: Physical Therapy

## 2019-07-20 ENCOUNTER — Other Ambulatory Visit: Payer: Self-pay

## 2019-07-20 ENCOUNTER — Ambulatory Visit: Payer: Medicare Other | Attending: Orthopedic Surgery | Admitting: Physical Therapy

## 2019-07-20 ENCOUNTER — Encounter: Payer: Self-pay | Admitting: Physical Therapy

## 2019-07-20 DIAGNOSIS — R2689 Other abnormalities of gait and mobility: Secondary | ICD-10-CM | POA: Diagnosis not present

## 2019-07-20 DIAGNOSIS — R2681 Unsteadiness on feet: Secondary | ICD-10-CM | POA: Diagnosis present

## 2019-07-20 DIAGNOSIS — M6281 Muscle weakness (generalized): Secondary | ICD-10-CM

## 2019-07-20 DIAGNOSIS — M21371 Foot drop, right foot: Secondary | ICD-10-CM | POA: Diagnosis present

## 2019-07-20 DIAGNOSIS — M21372 Foot drop, left foot: Secondary | ICD-10-CM | POA: Diagnosis present

## 2019-07-20 NOTE — Therapy (Signed)
[facility] [place] [facility] [address]  [place], [place], [address] Phone: [phone]   Fax:  [phone]  Physical Therapy Treatment  Patient Details  Name: [name] Trujillo MRN: [id number] Date of Birth: 1940/12/23 Referring Provider (PT): Wylene Simmer, MD   Encounter Date: 07/20/2019  PT End of Session - 07/20/19 1401    Number of Visits  16    Date for PT Re-Evaluation  08/31/19    Authorization Type  [employer] Medicare    PT Start Time  1401    PT Stop Time  1443    PT Time Calculation (min)  42 min    Activity Tolerance  Patient tolerated treatment well    Behavior During Therapy  [facility] for tasks assessed/performed       Past Medical History:  Diagnosis Date  . High cholesterol   . Hypothyroidism   . Neuropathy     Past Surgical History:  Procedure Laterality Date  . APPENDECTOMY     62-20 yo  . BACK SURGERY     Age 54  . CESAREAN SECTION    . ECTOPIC PREGNANCY SURGERY    . LAPAROSCOPIC HYSTERECTOMY      There were no vitals filed for this visit.  Subjective Assessment - 07/20/19 1406    Subjective  Pt reporting she missed last week due to acute onset of vertigo. Seems to be resolved now. Reports she was able to get down on the floor and clean for a long time yesterday.    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  No/denies                       [facility] Adult PT Treatment/Exercise - 07/20/19 1401      High Level Balance   High Level Balance Activities  Braiding      Knee/Hip Exercises: Aerobic   Elliptical  L2.0 x 2 min      Knee/Hip Exercises: Standing   Forward Lunges  Right;Left;10 reps;3 seconds    Forward Lunges Limitations  UE support on back of chair    Hip Abduction  Right;Left;15 reps;Knee bent;Stengthening    Abduction Limitations  Fitter (1 black) - 2 pole A     Hip Extension  Right;Left;15 reps;Knee bent;Stengthening    Extension Limitations  Fitter (1 black) - 2 pole A    Other Standing Knee Exercises  Alt toe clears to 9" step x 20, intermittent UE support on counter             PT Education - 07/20/19 1443    Education Details  HEP update - lunge       PT Short Term Goals - 06/01/19 1358      PT SHORT TERM GOAL #1   Title  Independent with initial HEP    Status  Achieved   06/01/19       PT Long Term Goals - 07/06/19 1322      PT LONG TERM GOAL #1   Title  Independent with advanced/ongoing HEP    Status  Partially Met    Target Date  08/31/19      PT LONG TERM GOAL #2   Title  B hip strength >/= 4/5 and B ankle strength >/= 3+/5 to 4-/5 for improved gait  stability    Status  Partially Met    Target Date  08/31/19      PT LONG TERM GOAL #3   Title  Berg >/= 52/56 to reduce risk for falls    Status  On-going    Target Date  08/31/19      PT LONG TERM GOAL #4   Title  FGA >/= 23/30 to improve gait safety and stability    Status  On-going    Target Date  08/31/19      PT LONG TERM GOAL #5   Title  Pt will ambulate with normal gait pattern w/o evidence of B foot drop/slap    Status  Partially Met    Target Date  08/31/19            Plan - 07/20/19 1443    Clinical Impression Statement  [name] Trujillo missed last week's visit secondary to acute onset of vertigo ("room spinning") lasting ~2 days occurring with changes of position most commonly rolling over to get out of bed. Symptoms now resolved per patient report. Educated patient on ability of PT to address vertigo if symptoms were to resume. She notes decreasing foot slap with better control of eccentric dorsiflexion but notes decreasing sensory input leading to increased difficulty with coordination of balance activities such as toe clears and braiding/grapevine, therefore practiced these activities with PT guarding for safety. Continued dynamic proximal  strengthening with Fitter hip abduction and extension with close guarding by PT and UE support for balance. Patient noting similarity to genuflecting at church which she has not had to do for past several months due to COVID-19 and questions her ability to do so when she is able to return to church services - introduced lunges for HEP to practice "genuflecting" motion.    Comorbidities  neuropathy; hypthyroidism; DDD; remote h/o back surgery at age 57; h/o scoliosis    Rehab Potential  Good    PT Frequency  1x / week    PT Duration  8 weeks    PT Treatment/Interventions  ADLs/Self Care Home Management;Electrical Stimulation;Iontophoresis 76m/ml Dexamethasone;Moist Heat;Ultrasound;Gait training;Stair training;Functional mobility training;Therapeutic activities;Therapeutic exercise;Balance training;Neuromuscular re-education;Patient/family education;Orthotic Fit/Training;Manual techniques;Passive range of motion;Dry needling;Taping;Joint Manipulations    PT Next Visit Plan  progress B ankle ROM and proximxal/distal LE strengthening, balance training    PT Home Exercise Plan  05/24/19 - gastroc/soleus/peroneal stretches, seated heel-toe raises; 05/27/19 - modified peroneal stretch, 4-way ankle with yellow TB, sidelying ankle inversion/eversion; 06/01/19 - toe & heel raise progression to standing with SLS eccentric lowering as tolerated, SLS, tandem stance & SLS clocks with intermittent UE support; 06/08/19 - posterior & lateral hip bumps, fwd wall falls; 06/17/19 - progression of standing heel raise, side stepping with looped yellow TB at midfoot, Airex pad balance; 06/22/19 - corner balance progression with Airex pad; 06/29/19 - standing 4-way SLR with red TB; 07/06/19 - quadruped hip extension & ER; 07/19/19 - supported lunge    Consulted and Agree with Plan of Care  Patient       Patient will benefit from skilled therapeutic intervention in order to improve the following deficits and impairments:  Abnormal  gait, Decreased activity tolerance, Decreased balance, Decreased coordination, Decreased endurance, Decreased knowledge of use of DME, Decreased mobility, Decreased range of motion, Decreased safety awareness, Decreased strength, Difficulty walking, Increased edema, Increased fascial restricitons, Increased muscle spasms, Impaired perceived functional ability, Impaired flexibility, Pain  Visit Diagnosis: Other abnormalities of gait and mobility  Foot drop, left  Foot drop,  right  Muscle weakness (generalized)  Unsteadiness on feet     Problem List Patient Active Problem List   Diagnosis Date Noted  . Ventricular premature beats 10/31/2015  . Heart murmur 10/31/2015    Percival Spanish, PT, MPT 07/20/2019, 3:02 PM  [facility] [address]  [place], [place], [address] Phone: [phone]   Fax:  [phone]  Name: [name] Trujillo MRN: [id number] Date of Birth: 09-29-1940

## 2019-07-27 ENCOUNTER — Encounter: Payer: Self-pay | Admitting: Physical Therapy

## 2019-07-27 ENCOUNTER — Ambulatory Visit: Payer: Medicare Other | Admitting: Physical Therapy

## 2019-07-27 ENCOUNTER — Other Ambulatory Visit: Payer: Self-pay

## 2019-07-27 DIAGNOSIS — M21371 Foot drop, right foot: Secondary | ICD-10-CM

## 2019-07-27 DIAGNOSIS — R2689 Other abnormalities of gait and mobility: Secondary | ICD-10-CM

## 2019-07-27 DIAGNOSIS — R2681 Unsteadiness on feet: Secondary | ICD-10-CM

## 2019-07-27 DIAGNOSIS — M6281 Muscle weakness (generalized): Secondary | ICD-10-CM

## 2019-07-27 DIAGNOSIS — M21372 Foot drop, left foot: Secondary | ICD-10-CM

## 2019-07-27 NOTE — Patient Instructions (Signed)
    Home exercise program created by JoAnne Kreis, PT.  For questions, please contact JoAnne via phone at [phone] or email at [email or web address]  [facility] [address]  [address] [place], [place], [address] Phone: [phone]   Fax:  [phone]    

## 2019-07-27 NOTE — Therapy (Signed)
[facility] [address]  [place], [place], [address] Phone: [phone]   Fax:  [phone]  Physical Therapy Treatment  Patient Details  Name: [name] Trujillo MRN: [id number] Date of Birth: 12-Sep-1940 Referring Provider (PT): Wylene Simmer, MD   Encounter Date: 07/27/2019  PT End of Session - 07/27/19 1315    Visit Number  10    Number of Visits  16    Date for PT Re-Evaluation  08/31/19    Authorization Type  [employer] Medicare    PT Start Time  1315    PT Stop Time  1401    PT Time Calculation (min)  46 min    Activity Tolerance  Patient tolerated treatment well    Behavior During Therapy  [facility] for tasks assessed/performed       Past Medical History:  Diagnosis Date  . High cholesterol   . Hypothyroidism   . Neuropathy     Past Surgical History:  Procedure Laterality Date  . APPENDECTOMY     74-20 yo  . BACK SURGERY     Age 62  . CESAREAN SECTION    . ECTOPIC PREGNANCY SURGERY    . LAPAROSCOPIC HYSTERECTOMY      There were no vitals filed for this visit.  Subjective Assessment - 07/27/19 1321    Subjective  Pt noting increased soreness with walking for a few days after warm-up on elliptical last session but otherwise noting progress with walking with improving endurance and stability.    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    Patient Stated Goals  "To walk with better control of my feet"                       [facility] Adult PT Treatment/Exercise - 07/27/19 1315      Knee/Hip Exercises: Aerobic   Recumbent Bike  L2 x 6 min             PT Education - 07/27/19 1400    Education Details  HEP update - foot intrinsic strengthening    Person(s) Educated  Patient    Methods  Explanation;Demonstration;Handout    Comprehension  Verbalized understanding;Returned demonstration;Need further instruction       PT Short Term Goals - 06/01/19 1358       PT SHORT TERM GOAL #1   Title  Independent with initial HEP    Status  Achieved   06/01/19       PT Long Term Goals - 07/27/19 1317      PT LONG TERM GOAL #1   Title  Independent with advanced/ongoing HEP    Status  Partially Met      PT LONG TERM GOAL #2   Title  B hip strength >/= 4/5 and B ankle strength >/= 3+/5 to 4-/5 for improved gait stability    Status  Partially Met      PT LONG TERM GOAL #3   Title  Berg >/= 52/56 to reduce risk for falls    Status  On-going      PT LONG TERM GOAL #4   Title  FGA >/= 23/30 to improve gait safety and stability    Status  On-going      PT LONG TERM GOAL #5   Title  Pt will ambulate with normal gait pattern w/o evidence of B foot drop/slap    Status  Partially Met  Plan - 07/27/19 1317    Clinical Impression Statement  [name] Trujillo reporting she saw Dr. Doran Durand for her f/u visit and he noted the improvement in her ankle control and gait but still wants her to obtain B AFOs to have available if her weakness returns or her sensory loss progresses. Patient noting improved ambulation tolerance with less wobbling/instability as well as improving ability to raise up on her toes to reach into the overhead cabinets and increased ease of getting up from the ground. However she also expresses concerns regarding feeling of legs "caving in" (equino & genu valgus) when standing w/o shoes such as when showering, hence treatment focusing on intrinsic foot strengthening for improved arch support and medial/lateral foot stability.    Comorbidities  neuropathy; hypthyroidism; DDD; remote h/o back surgery at age 26; h/o scoliosis    Rehab Potential  Good    PT Frequency  1x / week    PT Duration  8 weeks    PT Treatment/Interventions  ADLs/Self Care Home Management;Electrical Stimulation;Iontophoresis '4mg'$ /ml Dexamethasone;Moist Heat;Ultrasound;Gait training;Stair training;Functional mobility training;Therapeutic activities;Therapeutic  exercise;Balance training;Neuromuscular re-education;Patient/family education;Orthotic Fit/Training;Manual techniques;Passive range of motion;Dry needling;Taping;Joint Manipulations    PT Next Visit Plan  progress B ankle ROM and proximxal/distal LE strengthening, balance training    PT Home Exercise Plan  05/24/19 - gastroc/soleus/peroneal stretches, seated heel-toe raises; 05/27/19 - modified peroneal stretch, 4-way ankle with yellow TB, sidelying ankle inversion/eversion; 06/01/19 - toe & heel raise progression to standing with SLS eccentric lowering as tolerated, SLS, tandem stance & SLS clocks with intermittent UE support; 06/08/19 - posterior & lateral hip bumps, fwd wall falls; 06/17/19 - progression of standing heel raise, side stepping with looped yellow TB at midfoot, Airex pad balance; 06/22/19 - corner balance progression with Airex pad; 06/29/19 - standing 4-way SLR with red TB; 07/06/19 - quadruped hip extension & ER; 07/19/19 - supported lunge; 07/27/19 - foot instrinic strengthening    Consulted and Agree with Plan of Care  Patient       Patient will benefit from skilled therapeutic intervention in order to improve the following deficits and impairments:  Abnormal gait, Decreased activity tolerance, Decreased balance, Decreased coordination, Decreased endurance, Decreased knowledge of use of DME, Decreased mobility, Decreased range of motion, Decreased safety awareness, Decreased strength, Difficulty walking, Increased edema, Increased fascial restricitons, Increased muscle spasms, Impaired perceived functional ability, Impaired flexibility, Pain  Visit Diagnosis: Other abnormalities of gait and mobility  Foot drop, left  Foot drop, right  Muscle weakness (generalized)  Unsteadiness on feet     Problem List Patient Active Problem List   Diagnosis Date Noted  . Ventricular premature beats 10/31/2015  . Heart murmur 10/31/2015    Percival Spanish, PT, MPT 07/27/2019, 3:55  PM  [facility] [address]  [place], [place], [address] Phone: [phone]   Fax:  [phone]  Name: [name] Trujillo MRN: [id number] Date of Birth: 1940-10-04

## 2019-08-03 ENCOUNTER — Ambulatory Visit: Payer: Medicare Other | Admitting: Physical Therapy

## 2019-08-03 ENCOUNTER — Other Ambulatory Visit: Payer: Self-pay

## 2019-08-03 ENCOUNTER — Encounter: Payer: Self-pay | Admitting: Physical Therapy

## 2019-08-03 DIAGNOSIS — R2689 Other abnormalities of gait and mobility: Secondary | ICD-10-CM

## 2019-08-03 DIAGNOSIS — M21372 Foot drop, left foot: Secondary | ICD-10-CM

## 2019-08-03 DIAGNOSIS — M6281 Muscle weakness (generalized): Secondary | ICD-10-CM

## 2019-08-03 DIAGNOSIS — M21371 Foot drop, right foot: Secondary | ICD-10-CM

## 2019-08-03 DIAGNOSIS — R2681 Unsteadiness on feet: Secondary | ICD-10-CM

## 2019-08-03 NOTE — Therapy (Signed)
[facility] [address]  [place], [place], [address] Phone: [phone]   Fax:  [phone]  Physical Therapy Treatment  Patient Details  Name: [name] Trujillo MRN: [id number] Date of Birth: 09-Jul-1941 Referring Provider (PT): Wylene Simmer, MD   Encounter Date: 08/03/2019  PT End of Session - 08/03/19 1315    Visit Number  11    Number of Visits  16    Date for PT Re-Evaluation  08/31/19    Authorization Type  [employer] Medicare    PT Start Time  1315    PT Stop Time  1419    PT Time Calculation (min)  64 min    Activity Tolerance  Patient tolerated treatment well    Behavior During Therapy  [facility] for tasks assessed/performed       Past Medical History:  Diagnosis Date  . High cholesterol   . Hypothyroidism   . Neuropathy     Past Surgical History:  Procedure Laterality Date  . APPENDECTOMY     60-20 yo  . BACK SURGERY     Age 39  . CESAREAN SECTION    . ECTOPIC PREGNANCY SURGERY    . LAPAROSCOPIC HYSTERECTOMY      There were no vitals filed for this visit.  Subjective Assessment - 08/03/19 1318    Subjective  Pt reporting she has worked in the yard this week and was able to get up from the gorund easier. Feels R foot is more numb than L.    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  No/denies         [facility] PT Assessment - 08/03/19 1315      Assessment   Next MD Visit  none scheduled                   [facility] Adult PT Treatment/Exercise - 08/03/19 1315      Knee/Hip Exercises: Aerobic   Recumbent Bike  L2 x 6 min      Manual Therapy   Manual Therapy  Soft tissue mobilization;Myofascial release    Manual therapy comments  long-sitting    Soft tissue mobilization  STM to L anterior tibialis    Myofascial Release  manual TPR to L anterior tibialis; MFR and pin &  stretch to L anterior tibialis      Ankle Exercises: Standing   Heel Raises  Both;10 reps;3 seconds    Heel Raises Limitations  B con/ecc with negative heel on back of UBE    Heel Walk (Round Trip)  2 x 10 ft - UE support on counter - increased difficulty maintaining DF on L    Toe Walk (Round Trip)  2 x 10 ft - UE support on counter - increased difficulty maintaining PF/heel lift on L      Ankle Exercises: Supine   Other Supine Ankle Exercises  Long-sitting L DF x10 - verbal and tactile cues to maintain neutral foot avoiding eversion      Ankle Exercises: Seated   Other Seated Ankle Exercises  L ankle PF (green TB) x 10, DF (yellow TB) x 10      Ankle Exercises: Stretches   Other Stretch  L anterior tibialis stretch - standing with leg extended and toes on floor x 30 sec, standing with  toes propped on seat of chair x 30 sec, manual by PT in long-stting 2 x 30 sec       Trigger Point Dry Needling - 08/03/19 1315    Consent Given?  Yes    Education Handout Provided  Previously provided    Muscles Treated Lower Quadrant  Anterior tibialis   Left   Anterior tibialis Response  Twitch response elicited;Palpable increased muscle length           PT Education - 08/03/19 1350    Education Details  Role of DN; expected response to treatment and post-treatment activity recommendations; HEP update - tibialis anterior stretches    Person(s) Educated  Patient    Methods  Explanation;Demonstration;Handout    Comprehension  Verbalized understanding;Returned demonstration;Need further instruction       PT Short Term Goals - 06/01/19 1358      PT SHORT TERM GOAL #1   Title  Independent with initial HEP    Status  Achieved   06/01/19       PT Long Term Goals - 07/27/19 1317      PT LONG TERM GOAL #1   Title  Independent with advanced/ongoing HEP    Status  Partially Met      PT LONG TERM GOAL #2   Title  B hip strength >/= 4/5 and B ankle strength >/= 3+/5 to 4-/5 for improved  gait stability    Status  Partially Met      PT LONG TERM GOAL #3   Title  Berg >/= 52/56 to reduce risk for falls    Status  On-going      PT LONG TERM GOAL #4   Title  FGA >/= 23/30 to improve gait safety and stability    Status  On-going      PT LONG TERM GOAL #5   Title  Pt will ambulate with normal gait pattern w/o evidence of B foot drop/slap    Status  Partially Met            Plan - 08/03/19 1320    Clinical Impression Statement  [name] Trujillo reporting increased ease getting up from the ground when working outdoors in her yard but noting more difficulty controlling L foot slap today. She demonstrates difficulty isolating L anterior tibialis w/o peroneals taking over pulling foot into eversion. Palpation of L anterior tibialis revealing trace+ muscle activation but increased muscle tension and ttp/TPs identified throughout muscle belly.  Patient had demonstrated previous positive response to DN for similar issues in peroneals during last therapy episode, therefore upon informed patient consent, addressed abnormal muscle tension with manual therapy incorporating DN with positive twitch response elicited and palpable reduction in muscle tension noted. Manual therapy followed by relevant stretches for anterior tibialis and modification of L DF ROM/strengthening exercises to better isolated anterior tibialis while avoiding peroneal substitution. Patient able to walk out of clinic with better control of L foot slap.    Comorbidities  neuropathy; hypthyroidism; DDD; remote h/o back surgery at age 6; h/o scoliosis    Rehab Potential  Good    PT Frequency  1x / week    PT Duration  8 weeks    PT Treatment/Interventions  ADLs/Self Care Home Management;Electrical Stimulation;Iontophoresis '4mg'$ /ml Dexamethasone;Moist Heat;Ultrasound;Gait training;Stair training;Functional mobility training;Therapeutic activities;Therapeutic exercise;Balance training;Neuromuscular re-education;Patient/family  education;Orthotic Fit/Training;Manual techniques;Passive range of motion;Dry needling;Taping;Joint Manipulations    PT Next Visit Plan  progress B ankle ROM and proximxal/distal LE strengthening, balance training    PT Home Exercise  Plan  05/24/19 - gastroc/soleus/peroneal stretches, seated heel-toe raises; 05/27/19 - modified peroneal stretch, 4-way ankle with yellow TB, sidelying ankle inversion/eversion; 06/01/19 - toe & heel raise progression to standing with SLS eccentric lowering as tolerated, SLS, tandem stance & SLS clocks with intermittent UE support; 06/08/19 - posterior & lateral hip bumps, fwd wall falls; 06/17/19 - progression of standing heel raise, side stepping with looped yellow TB at midfoot, Airex pad balance; 06/22/19 - corner balance progression with Airex pad; 06/29/19 - standing 4-way SLR with red TB; 07/06/19 - quadruped hip extension & ER; 07/19/19 - supported lunge; 07/27/19 - foot instrinic strengthening    Consulted and Agree with Plan of Care  Patient       Patient will benefit from skilled therapeutic intervention in order to improve the following deficits and impairments:  Abnormal gait, Decreased activity tolerance, Decreased balance, Decreased coordination, Decreased endurance, Decreased knowledge of use of DME, Decreased mobility, Decreased range of motion, Decreased safety awareness, Decreased strength, Difficulty walking, Increased edema, Increased fascial restricitons, Increased muscle spasms, Impaired perceived functional ability, Impaired flexibility, Pain  Visit Diagnosis: Other abnormalities of gait and mobility  Foot drop, left  Foot drop, right  Muscle weakness (generalized)  Unsteadiness on feet     Problem List Patient Active Problem List   Diagnosis Date Noted  . Ventricular premature beats 10/31/2015  . Heart murmur 10/31/2015    Percival Spanish, PT, MPT 08/03/2019, 3:55 PM  [facility] [address]  [place], [place], [address] Phone: [phone]   Fax:  [phone]  Name: [name] Trujillo MRN: [id number] Date of Birth: 03-07-1941

## 2019-08-10 ENCOUNTER — Ambulatory Visit: Payer: Medicare Other | Attending: Orthopedic Surgery | Admitting: Physical Therapy

## 2019-08-10 ENCOUNTER — Encounter: Payer: Self-pay | Admitting: Physical Therapy

## 2019-08-10 ENCOUNTER — Other Ambulatory Visit: Payer: Self-pay

## 2019-08-10 DIAGNOSIS — M6281 Muscle weakness (generalized): Secondary | ICD-10-CM | POA: Diagnosis present

## 2019-08-10 DIAGNOSIS — M21372 Foot drop, left foot: Secondary | ICD-10-CM | POA: Diagnosis present

## 2019-08-10 DIAGNOSIS — R2681 Unsteadiness on feet: Secondary | ICD-10-CM | POA: Diagnosis present

## 2019-08-10 DIAGNOSIS — M21371 Foot drop, right foot: Secondary | ICD-10-CM

## 2019-08-10 DIAGNOSIS — R2689 Other abnormalities of gait and mobility: Secondary | ICD-10-CM | POA: Diagnosis not present

## 2019-08-10 NOTE — Therapy (Signed)
[facility] [address]  [place], [place], [address] Phone: [phone]   Fax:  [phone]  Physical Therapy Treatment  Patient Details  Name: [name] Trujillo MRN: [id number] Date of Birth: 1940/10/17 Referring Provider (PT): Wylene Simmer, MD   Encounter Date: 08/10/2019  PT End of Session - 08/10/19 1310    Visit Number  12    Number of Visits  16    Date for PT Re-Evaluation  08/31/19    Authorization Type  [employer] Medicare    PT Start Time  1310    PT Stop Time  1402    PT Time Calculation (min)  52 min    Activity Tolerance  Patient tolerated treatment well    Behavior During Therapy  [facility] for tasks assessed/performed       Past Medical History:  Diagnosis Date  . High cholesterol   . Hypothyroidism   . Neuropathy     Past Surgical History:  Procedure Laterality Date  . APPENDECTOMY     37-20 yo  . BACK SURGERY     Age 15  . CESAREAN SECTION    . ECTOPIC PREGNANCY SURGERY    . LAPAROSCOPIC HYSTERECTOMY      There were no vitals filed for this visit.  Subjective Assessment - 08/10/19 1313    Subjective  Pt feels like DN helped her better isolated the tib anterior w/o accessory motion from peroneals. Reports her dtr noting she was walking better at Thanksgiving holiday. Pt requesting review of latest HEP update.    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  No/denies                       [facility] Adult PT Treatment/Exercise - 08/10/19 1310      Knee/Hip Exercises: Aerobic   Recumbent Bike  L2 x 6 min      Manual Therapy   Manual Therapy  Soft tissue mobilization;Myofascial release    Manual therapy comments  supine and R side lying    Soft tissue mobilization  STM to L anterior tibialis & peroneals    Myofascial Release  manual TPR, MFR and pin & stretch to L  anterior tibialis and peroneals      Ankle Exercises: Supine   Isometrics  B inversion isometric pillow squeeze 10 x 5"    Other Supine Ankle Exercises  Long-sitting L DF x10 - verbal and tactile cues to maintain neutral foot avoiding eversion    Other Supine Ankle Exercises  L A/AAROM inversion/eversion 2 x10      Ankle Exercises: Stretches   Other Stretch  L/R anterior tibialis stretch - seated with leg tucked under seat of chair 2 x 30 sec       Trigger Point Dry Needling - 08/10/19 1310    Consent Given?  Yes    Muscles Treated Lower Quadrant  Posterior tibialis;Peroneals   Left   Peroneals Response  Twitch response elicited;Palpable increased muscle length    Posterior tibialis Response  Twitch response elicited;Palpable increased muscle length             PT Short Term Goals - 06/01/19 1358      PT SHORT TERM GOAL #1   Title  Independent with initial HEP  Status  Achieved   06/01/19       PT Long Term Goals - 07/27/19 1317      PT LONG TERM GOAL #1   Title  Independent with advanced/ongoing HEP    Status  Partially Met      PT LONG TERM GOAL #2   Title  B hip strength >/= 4/5 and B ankle strength >/= 3+/5 to 4-/5 for improved gait stability    Status  Partially Met      PT LONG TERM GOAL #3   Title  Berg >/= 52/56 to reduce risk for falls    Status  On-going      PT LONG TERM GOAL #4   Title  FGA >/= 23/30 to improve gait safety and stability    Status  On-going      PT LONG TERM GOAL #5   Title  Pt will ambulate with normal gait pattern w/o evidence of B foot drop/slap    Status  Partially Met            Plan - 08/10/19 1315    Clinical Impression Statement  [name] Trujillo reporting increased ease of L foot active DF with decreased foot slap following DN for anterior tibialis last session. Also noting decreased tendency to compensate with peroneals allowing for more neutral DF ROM. Patient did request review of anterior tibialis stretching as she had  difficulty replicating stretches at home - offered seated alternative with better ability to perform return demonstration. She continues to struggle with L ankle inversion and given positive response to DN last session, targeted further DN to L posterior tibialis and peroneals to allow for increased posterior tibialis recruitment with decreased peroneal inhibition. Reinforced desired muscle activity with A/AAROM and inversion isometrics.    Comorbidities  neuropathy; hypthyroidism; DDD; remote h/o back surgery at age 30; h/o scoliosis    Rehab Potential  Good    PT Frequency  1x / week    PT Duration  8 weeks    PT Treatment/Interventions  ADLs/Self Care Home Management;Electrical Stimulation;Iontophoresis '4mg'$ /ml Dexamethasone;Moist Heat;Ultrasound;Gait training;Stair training;Functional mobility training;Therapeutic activities;Therapeutic exercise;Balance training;Neuromuscular re-education;Patient/family education;Orthotic Fit/Training;Manual techniques;Passive range of motion;Dry needling;Taping;Joint Manipulations    PT Next Visit Plan  progress B ankle ROM and proximxal/distal LE strengthening, balance training    PT Home Exercise Plan  05/24/19 - gastroc/soleus/peroneal stretches, seated heel-toe raises; 05/27/19 - modified peroneal stretch, 4-way ankle with yellow TB, sidelying ankle inversion/eversion; 06/01/19 - toe & heel raise progression to standing with SLS eccentric lowering as tolerated, SLS, tandem stance & SLS clocks with intermittent UE support; 06/08/19 - posterior & lateral hip bumps, fwd wall falls; 06/17/19 - progression of standing heel raise, side stepping with looped yellow TB at midfoot, Airex pad balance; 06/22/19 - corner balance progression with Airex pad; 06/29/19 - standing 4-way SLR with red TB; 07/06/19 - quadruped hip extension & ER; 07/19/19 - supported lunge; 07/27/19 - foot instrinic strengthening; 08/03/19 - anterior tibialis stretching; 08/10/19 - ankle inversion isometrics     Consulted and Agree with Plan of Care  Patient       Patient will benefit from skilled therapeutic intervention in order to improve the following deficits and impairments:  Abnormal gait, Decreased activity tolerance, Decreased balance, Decreased coordination, Decreased endurance, Decreased knowledge of use of DME, Decreased mobility, Decreased range of motion, Decreased safety awareness, Decreased strength, Difficulty walking, Increased edema, Increased fascial restricitons, Increased muscle spasms, Impaired perceived functional ability, Impaired flexibility, Pain  Visit Diagnosis: Other abnormalities  of gait and mobility  Foot drop, left  Foot drop, right  Muscle weakness (generalized)  Unsteadiness on feet     Problem List Patient Active Problem List   Diagnosis Date Noted  . Ventricular premature beats 10/31/2015  . Heart murmur 10/31/2015    Percival Spanish, PT, MPT 08/10/2019, 7:40 PM  [facility] [address]  [place], [place], [address] Phone: [phone]   Fax:  [phone]  Name: [name] Trujillo MRN: [id number] Date of Birth: 04/14/1941

## 2019-08-17 ENCOUNTER — Other Ambulatory Visit: Payer: Self-pay

## 2019-08-17 ENCOUNTER — Ambulatory Visit: Payer: Medicare Other | Admitting: Physical Therapy

## 2019-08-17 ENCOUNTER — Encounter: Payer: Self-pay | Admitting: Physical Therapy

## 2019-08-17 DIAGNOSIS — M21371 Foot drop, right foot: Secondary | ICD-10-CM

## 2019-08-17 DIAGNOSIS — R2689 Other abnormalities of gait and mobility: Secondary | ICD-10-CM

## 2019-08-17 DIAGNOSIS — M21372 Foot drop, left foot: Secondary | ICD-10-CM

## 2019-08-17 DIAGNOSIS — M6281 Muscle weakness (generalized): Secondary | ICD-10-CM

## 2019-08-17 DIAGNOSIS — R2681 Unsteadiness on feet: Secondary | ICD-10-CM

## 2019-08-17 NOTE — Therapy (Signed)
[facility] [address]  [place], [place], [address] Phone: [phone]   Fax:  [phone]  Physical Therapy Treatment  Patient Details  Name: [name] Trujillo MRN: [id number] Date of Birth: Nov 06, 1940 Referring Provider (PT): Wylene Simmer, MD   Encounter Date: 08/17/2019  PT End of Session - 08/17/19 1319    Visit Number  13    Number of Visits  16    Date for PT Re-Evaluation  08/31/19    Authorization Type  [employer] Medicare    PT Start Time  1319    PT Stop Time  1402    PT Time Calculation (min)  43 min    Activity Tolerance  Patient tolerated treatment well    Behavior During Therapy  [facility] for tasks assessed/performed       Past Medical History:  Diagnosis Date  . High cholesterol   . Hypothyroidism   . Neuropathy     Past Surgical History:  Procedure Laterality Date  . APPENDECTOMY     27-18 yo  . BACK SURGERY     Age 35  . CESAREAN SECTION    . ECTOPIC PREGNANCY SURGERY    . LAPAROSCOPIC HYSTERECTOMY      There were no vitals filed for this visit.  Subjective Assessment - 08/17/19 1321    Subjective  Pt feels like DN "has worked wonders" - feels like her leg feels more normal. Reports she met with orthotist last week and will be getting B orthotics and AFOs - due to be available on 09/09/19.    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  No/denies                       [facility] Adult PT Treatment/Exercise - 08/17/19 1319      Knee/Hip Exercises: Aerobic   Recumbent Bike  L2 x 6.5 min      Manual Therapy   Manual Therapy  Soft tissue mobilization;Myofascial release;Passive ROM    Manual therapy comments  supine and R side lying    Soft tissue mobilization  STM to L anterior tibialis & peroneals    Myofascial Release  manual TPR, MFR and pin & stretch to L  anterior tibialis and peroneals    Passive ROM  L ankle inversion/eversion with manual peroneal stretch x 30 sec      Ankle Exercises: Seated   Ankle Circles/Pumps  Left;AROM;10 reps   1 set each   Ankle Circles/Pumps Limitations  CW/CCW circles - flattened medial arc; DF/PF attempting to maintain neurtral line (decreased eversion compensation noted)      Ankle Exercises: Stretches   Other Stretch  B peroneal stretch - standing with towel under medial foot and seated with ankle inverted & slight DF with lateral foot resting on floor 2 x 30 sec each - pt noting better stretch with seated version    Other Stretch  L/R anterior tibialis stretch - seated with leg tucked under seat of chair 2 x 30 sec      Ankle Exercises: Supine   Isometrics  L inversion isometric into PT's hand 10 x 5"       Trigger Point Dry Needling - 08/17/19 1319    Consent Given?  Yes    Muscles Treated Lower Quadrant  Posterior tibialis;Peroneals   Left   Peroneals Response  Twitch response elicited;Palpable increased muscle length    Posterior tibialis Response  Twitch response elicited;Palpable increased muscle length             PT Short Term Goals - 06/01/19 1358      PT SHORT TERM GOAL #1   Title  Independent with initial HEP    Status  Achieved   06/01/19       PT Long Term Goals - 07/27/19 1317      PT LONG TERM GOAL #1   Title  Independent with advanced/ongoing HEP    Status  Partially Met      PT LONG TERM GOAL #2   Title  B hip strength >/= 4/5 and B ankle strength >/= 3+/5 to 4-/5 for improved gait stability    Status  Partially Met      PT LONG TERM GOAL #3   Title  Berg >/= 52/56 to reduce risk for falls    Status  On-going      PT LONG TERM GOAL #4   Title  FGA >/= 23/30 to improve gait safety and stability    Status  On-going      PT LONG TERM GOAL #5   Title  Pt will ambulate with normal gait pattern w/o evidence of B foot drop/slap    Status  Partially Met             Plan - 08/17/19 1323    Clinical Impression Statement  [name] Trujillo reporting she met with the orthotist last week and was molded for B orthotics and AFOs which should be completed and ready on 09/09/19 - she is requesting to extend PT until after orthotics received to allow for assessment and training using orthotics, therefore will plan for recert at end of existing POC. She also states orthotist had her try a shoe lift to compensate for her leg length discrepancy stemming from her scoliosis, however by 2nd day, she was noting increased hip and back pain so she has stopped using the lift. [name] Trujillo noting more "normal feeling" in L lower leg following DN last session and reports she was better able to complete ankle CW/CW circles through full range as well as increased inversion AROM, with effects lasting until Saturday. She requested further DN today which was performed to L peroneals and posterior tibialis with positive twitch responses elicited and patient reporting she is already noting the difference with walking upon exiting the clinic.    Comorbidities  neuropathy; hypthyroidism; DDD; remote h/o back surgery at age 30; h/o scoliosis    Rehab Potential  Good    PT Frequency  1x / week    PT Duration  8 weeks    PT Treatment/Interventions  ADLs/Self Care Home Management;Electrical Stimulation;Iontophoresis '4mg'$ /ml Dexamethasone;Moist Heat;Ultrasound;Gait training;Stair training;Functional mobility training;Therapeutic activities;Therapeutic exercise;Balance training;Neuromuscular re-education;Patient/family education;Orthotic Fit/Training;Manual techniques;Passive range of motion;Dry needling;Taping;Joint Manipulations    PT Next Visit Plan  progress B ankle ROM and proximxal/distal LE strengthening, balance training, manual therapy including DN as indicated to normalize muscle tension and promote increased muscle activation    PT Home Exercise Plan  05/24/19 - gastroc/soleus/peroneal stretches,  seated heel-toe raises; 05/27/19 - modified peroneal stretch, 4-way ankle with yellow TB, sidelying ankle inversion/eversion; 06/01/19 - toe & heel raise progression to standing with SLS eccentric lowering as tolerated, SLS, tandem stance & SLS clocks with intermittent UE support; 06/08/19 - posterior & lateral hip bumps, fwd wall falls; 06/17/19 -  progression of standing heel raise, side stepping with looped yellow TB at midfoot, Airex pad balance; 06/22/19 - corner balance progression with Airex pad; 06/29/19 - standing 4-way SLR with red TB; 07/06/19 - quadruped hip extension & ER; 07/19/19 - supported lunge; 07/27/19 - foot instrinic strengthening; 08/03/19 - anterior tibialis stretching; 08/10/19 - ankle inversion isometrics    Consulted and Agree with Plan of Care  Patient       Patient will benefit from skilled therapeutic intervention in order to improve the following deficits and impairments:  Abnormal gait, Decreased activity tolerance, Decreased balance, Decreased coordination, Decreased endurance, Decreased knowledge of use of DME, Decreased mobility, Decreased range of motion, Decreased safety awareness, Decreased strength, Difficulty walking, Increased edema, Increased fascial restricitons, Increased muscle spasms, Impaired perceived functional ability, Impaired flexibility, Pain  Visit Diagnosis: Other abnormalities of gait and mobility  Foot drop, left  Foot drop, right  Muscle weakness (generalized)  Unsteadiness on feet     Problem List Patient Active Problem List   Diagnosis Date Noted  . Ventricular premature beats 10/31/2015  . Heart murmur 10/31/2015    Percival Spanish, PT, MPT 08/17/2019, 3:22 PM  [facility] [address]  [place], [place], [address] Phone: [phone]   Fax:  [phone]  Name: [name] Trujillo MRN: [id number] Date of Birth: 1941-08-08

## 2019-08-24 ENCOUNTER — Encounter: Payer: Self-pay | Admitting: Physical Therapy

## 2019-08-24 ENCOUNTER — Ambulatory Visit: Payer: Medicare Other | Admitting: Physical Therapy

## 2019-08-24 ENCOUNTER — Other Ambulatory Visit: Payer: Self-pay

## 2019-08-24 DIAGNOSIS — M21371 Foot drop, right foot: Secondary | ICD-10-CM

## 2019-08-24 DIAGNOSIS — M6281 Muscle weakness (generalized): Secondary | ICD-10-CM

## 2019-08-24 DIAGNOSIS — R2689 Other abnormalities of gait and mobility: Secondary | ICD-10-CM | POA: Diagnosis not present

## 2019-08-24 DIAGNOSIS — M21372 Foot drop, left foot: Secondary | ICD-10-CM

## 2019-08-24 DIAGNOSIS — R2681 Unsteadiness on feet: Secondary | ICD-10-CM

## 2019-08-24 NOTE — Therapy (Signed)
[facility] [address]  [place], [place], [address] Phone: [phone]   Fax:  [phone]  Physical Therapy Treatment  Patient Details  Name: [name] Trujillo MRN: [id number] Date of Birth: [birth year] Referring Provider (PT): Wylene Simmer, MD   Encounter Date: 08/24/2019  PT End of Session - 08/24/19 1314    Visit Number  14    Number of Visits  16    Date for PT Re-Evaluation  08/31/19    Authorization Type  [employer] Medicare    PT Start Time  1314    PT Stop Time  1356    PT Time Calculation (min)  42 min    Activity Tolerance  Patient tolerated treatment well    Behavior During Therapy  [facility] for tasks assessed/performed       Past Medical History:  Diagnosis Date  . High cholesterol   . Hypothyroidism   . Neuropathy     Past Surgical History:  Procedure Laterality Date  . APPENDECTOMY     46-18 yo  . BACK SURGERY     Age 89  . CESAREAN SECTION    . ECTOPIC PREGNANCY SURGERY    . LAPAROSCOPIC HYSTERECTOMY      There were no vitals filed for this visit.  Subjective Assessment - 08/24/19 1319    Subjective  Pt reporting worsening of numbness in R foot today without known trigger. Denies any pain. Pt seh has been doing more walking for exercise and notes less scissoring and better balance.    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  No/denies                       [facility] Adult PT Treatment/Exercise - 08/24/19 1314      Knee/Hip Exercises: Aerobic   Recumbent Bike  L2 x 6 min      Manual Therapy   Manual Therapy  Soft tissue mobilization;Myofascial release;Passive ROM    Manual therapy comments  supine     Soft tissue mobilization  STM to L flexor digitorum longus, anterior tibialis (small abrasion noted with ~2" bruise surrounding abrasion) & peroneals     Myofascial Release  manual TPR, MFR and pin & stretch to L anterior tibialis and peroneals    Passive ROM  L ankle inversion/eversion with manual peroneal stretch x 30 sec      Ankle Exercises: Sidelying   Ankle Inversion  Left;AAROM;10 reps    Ankle Inversion Limitations  AAROM concentric with attempt at slow eccentric AROM      Ankle Exercises: Supine   Isometrics  L inversion isometric into PT's hand 10 x 5"      Ankle Exercises: Seated   Ankle Circles/Pumps  Left;AROM;10 reps    Ankle Circles/Pumps Limitations  CW/CCW circles - flattened medial arc but slight improvement in continuous motion    Other Seated Ankle Exercises  L ankle inversion (self-AAROM concentric with attempt at slow eccentric AROM) in figure 4 position x 10       Trigger Point Dry Needling - 08/24/19 1314    Consent Given?  Yes    Muscles Treated Lower Quadrant  Posterior tibialis;Flexor Digitorum Longus   Left   Posterior tibialis Response  Twitch response elicited    Flexor digitorum longus Response  Twitch response elicited;Palpable increased muscle length             PT Short Term Goals - 06/01/19 1358      PT SHORT TERM GOAL #1   Title  Independent with initial HEP    Status  Achieved   06/01/19       PT Long Term Goals - 07/27/19 1317      PT LONG TERM GOAL #1   Title  Independent with advanced/ongoing HEP    Status  Partially Met      PT LONG TERM GOAL #2   Title  B hip strength >/= 4/5 and B ankle strength >/= 3+/5 to 4-/5 for improved gait stability    Status  Partially Met      PT LONG TERM GOAL #3   Title  Berg >/= 52/56 to reduce risk for falls    Status  On-going      PT LONG TERM GOAL #4   Title  FGA >/= 23/30 to improve gait safety and stability    Status  On-going      PT LONG TERM GOAL #5   Title  Pt will ambulate with normal gait pattern w/o evidence of B foot drop/slap    Status  Partially Met            Plan - 08/24/19 1322    Clinical Impression  Statement  Jerlene noting worsening of R foot numbness today without known trigger but denies any pain or new loss of motor control. She reports DN seems to be helping but still notes limited inversion ROM/control in L foot although ankle circles are becoming slightly more rounded medially and motion is more fluid vs choppy as it used to be. Performed further DN to promote increased posterior tibialis activation today as well as introduced AAROM concentric with AROM eccentric release to facilitate improved motor control. LE EMG testing at [facility] pending on 08/30/19. Patient continuing to express desire to continue PT until she has a chance to set how AFOs and orthotics work out, therefore will plan for recert next visit.    Personal Factors and Comorbidities  Time since onset of injury/illness/exacerbation;Past/Current Experience;Comorbidity 3+    Comorbidities  neuropathy; hypthyroidism; DDD; remote h/o back surgery at age 46; h/o scoliosis    Examination-Activity Limitations  Bend;Locomotion Level;Squat;Stand    Examination-Participation Restrictions  Yard Work;Community Activity;Laundry;Cleaning;Driving    Rehab Potential  Good    PT Frequency  1x / week    PT Duration  8 weeks    PT Treatment/Interventions  ADLs/Self Care Home Management;Electrical Stimulation;Iontophoresis '4mg'$ /ml Dexamethasone;Moist Heat;Ultrasound;Gait training;Stair training;Functional mobility training;Therapeutic activities;Therapeutic exercise;Balance training;Neuromuscular re-education;Patient/family education;Orthotic Fit/Training;Manual techniques;Passive range of motion;Dry needling;Taping;Joint Manipulations    PT Next Visit Plan  Recert; progress B ankle ROM and proximal/distal LE strengthening, balance training, manual therapy including DN as indicated to normalize muscle tension and promote increased muscle activation    PT Home Exercise Plan  05/24/19 - gastroc/soleus/peroneal stretches, seated heel-toe raises; 05/27/19 -  modified peroneal stretch, 4-way ankle with yellow TB, sidelying ankle inversion/eversion; 06/01/19 - toe & heel raise progression to standing with SLS eccentric lowering as tolerated, SLS, tandem stance & SLS clocks with intermittent UE support; 06/08/19 - posterior & lateral hip bumps, fwd wall falls; 06/17/19 - progression of standing heel raise, side stepping with looped yellow TB at midfoot, Airex pad balance; 06/22/19 - corner balance progression with Airex pad; 06/29/19 - standing 4-way SLR with red TB; 07/06/19 - quadruped hip  extension & ER; 07/19/19 - supported lunge; 07/27/19 - foot instrinic strengthening; 08/03/19 - anterior tibialis stretching; 08/10/19 - ankle inversion isometrics    Consulted and Agree with Plan of Care  Patient       Patient will benefit from skilled therapeutic intervention in order to improve the following deficits and impairments:  Abnormal gait, Decreased activity tolerance, Decreased balance, Decreased coordination, Decreased endurance, Decreased knowledge of use of DME, Decreased mobility, Decreased range of motion, Decreased safety awareness, Decreased strength, Difficulty walking, Increased edema, Increased fascial restricitons, Increased muscle spasms, Impaired perceived functional ability, Impaired flexibility, Pain  Visit Diagnosis: Other abnormalities of gait and mobility  Foot drop, left  Foot drop, right  Muscle weakness (generalized)  Unsteadiness on feet     Problem List Patient Active Problem List   Diagnosis Date Noted  . Ventricular premature beats 10/31/2015  . Heart murmur 10/31/2015    Percival Spanish, PT, MPT 08/24/2019, 2:27 PM  [facility] [address]  [place], [place], [address] Phone: [phone]   Fax:  [phone]  Name: Kebra Lowrimore MRN: [id number] Date of Birth: December 27, 1940

## 2019-08-31 ENCOUNTER — Encounter: Payer: Self-pay | Admitting: Physical Therapy

## 2019-08-31 ENCOUNTER — Other Ambulatory Visit: Payer: Self-pay

## 2019-08-31 ENCOUNTER — Ambulatory Visit: Payer: Medicare Other | Admitting: Physical Therapy

## 2019-08-31 DIAGNOSIS — R2689 Other abnormalities of gait and mobility: Secondary | ICD-10-CM

## 2019-08-31 DIAGNOSIS — M21372 Foot drop, left foot: Secondary | ICD-10-CM

## 2019-08-31 DIAGNOSIS — M6281 Muscle weakness (generalized): Secondary | ICD-10-CM

## 2019-08-31 DIAGNOSIS — R2681 Unsteadiness on feet: Secondary | ICD-10-CM

## 2019-08-31 DIAGNOSIS — M21371 Foot drop, right foot: Secondary | ICD-10-CM

## 2019-08-31 NOTE — Therapy (Signed)
[facility] [address]  [place], [place], [address] Phone: [phone]   Fax:  [phone]  Physical Therapy Treatment / Recert  Patient Details  Name: [name] Trujillo MRN: [id number] Date of Birth: Feb 27, 1941 Referring Provider (PT): Wylene Simmer, MD  Progress Note  Reporting Period 07/06/2019 to 08/31/2019  See note below for Objective Data and Assessment of Progress/Goals.     Encounter Date: 08/31/2019  PT End of Session - 08/31/19 1318    Visit Number  15    Number of Visits  21    Date for PT Re-Evaluation  10/19/19    Authorization Type  [employer] Medicare    PT Start Time  1318    PT Stop Time  1415    PT Time Calculation (min)  57 min    Activity Tolerance  Patient tolerated treatment well    Behavior During Therapy  WFL for tasks assessed/performed       Past Medical History:  Diagnosis Date  . High cholesterol   . Hypothyroidism   . Neuropathy     Past Surgical History:  Procedure Laterality Date  . APPENDECTOMY     57-18 yo  . BACK SURGERY     Age 80  . CESAREAN SECTION    . ECTOPIC PREGNANCY SURGERY    . LAPAROSCOPIC HYSTERECTOMY      There were no vitals filed for this visit.  Subjective Assessment - 08/31/19 1320    Subjective  Pt reporting she had to cancel her EMG testing yesterday and will have to reschedule. Notes increased back and neck muscle soreness after baking cookies for 3 hrs on Sunday - now resolved.    How long can you stand comfortably?  1 - 1.5 hrs    How long can you walk comfortably?  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  No/denies         [facility] PT Assessment - 08/31/19 1318      Assessment   Medical Diagnosis  Gait abnormality secondary to B foot drop    Referring Provider (PT)  Wylene Simmer, MD    Next MD Visit  none  scheduled      Strength   Right Hip Flexion  4+/5    Right Hip Extension  4+/5    Right Hip External Rotation   4-/5    Right Hip Internal Rotation  4/5    Right Hip ABduction  4/5    Right Hip ADduction  4/5    Left Hip Flexion  4+/5    Left Hip Extension  4+/5    Left Hip External Rotation  4-/5    Left Hip Internal Rotation  4/5    Left Hip ABduction  4/5    Left Hip ADduction  4-/5    Right Ankle Dorsiflexion  4-/5    Right Ankle Plantar Flexion  4-/5    Right Ankle Inversion  4-/5    Right Ankle Eversion  4+/5    Left Ankle Dorsiflexion  3+/5    Left Ankle Plantar Flexion  4-/5    Left Ankle Inversion  2+/5    Left Ankle Eversion  4-/5      Ambulation/Gait   Assistive device  None    Gait Pattern  Step-through pattern;Decreased dorsiflexion - left;Decreased dorsiflexion - right   no further foot slap but still limited eccentric DF control   Gait velocity  3.21 ft/sec      Standardized Balance Assessment   10 Meter Walk  10.22      Berg Balance Test   Sit to Stand  Able to stand without using hands and stabilize independently    Standing Unsupported  Able to stand safely 2 minutes    Sitting with Back Unsupported but Feet Supported on Floor or Stool  Able to sit safely and securely 2 minutes    Stand to Sit  Sits safely with minimal use of hands    Transfers  Able to transfer safely, minor use of hands    Standing Unsupported with Eyes Closed  Able to stand 10 seconds safely    Standing Unsupported with Feet Together  Able to place feet together independently and stand 1 minute safely    From Standing, Reach Forward with Outstretched Arm  Can reach confidently >25 cm (10")    From Standing Position, Pick up Object from Floor  Able to pick up shoe safely and easily    From Standing Position, Turn to Look Behind Over each Shoulder  Looks behind from both sides and weight shifts well    Turn 360 Degrees  Able to turn 360 degrees safely in 4 seconds or less    Standing  Unsupported, Alternately Place Feet on Step/Stool  Able to stand independently and safely and complete 8 steps in 20 seconds    Standing Unsupported, One Foot in Front  Able to plae foot ahead of the other independently and hold 30 seconds    Standing on One Leg  Tries to lift leg/unable to hold 3 seconds but remains standing independently    Total Score  52    Berg comment:  Lower risk for falls (> 25%)      Functional Gait  Assessment   Gait Level Surface  Walks 20 ft in less than 5.5 sec, no assistive devices, good speed, no evidence for imbalance, normal gait pattern, deviates no more than 6 in outside of the 12 in walkway width.    Change in Gait Speed  Able to change speed, demonstrates mild gait deviations, deviates 6-10 in outside of the 12 in walkway width, or no gait deviations, unable to achieve a major change in velocity, or uses a change in velocity, or uses an assistive device.    Gait with Horizontal Head Turns  Performs head turns smoothly with no change in gait. Deviates no more than 6 in outside 12 in walkway width    Gait with Vertical Head Turns  Performs task with slight change in gait velocity (eg, minor disruption to smooth gait path), deviates 6 - 10 in outside 12 in walkway width or uses assistive device    Gait and Pivot Turn  Pivot turns safely within 3 sec and stops quickly with no loss of balance.    Step Over Obstacle  Is able to step over 2 stacked shoe boxes taped together (9 in total height) without changing gait speed. No evidence of imbalance.    Gait with Narrow Base of Support  Ambulates less than 4 steps heel to toe or cannot perform without assistance.    Gait with Eyes Closed  Walks 20 ft, slow speed, abnormal gait pattern, evidence for imbalance, deviates 10-15 in outside 12 in walkway width. Requires more than 9 sec to ambulate  20 ft.    Ambulating Backwards  Walks 20 ft, uses assistive device, slower speed, mild gait deviations, deviates 6-10 in outside 12 in  walkway width.    Steps  Alternating feet, no rail.    Total Score  22    FGA comment:  19-24 = medium risk fall                   [facility] Adult PT Treatment/Exercise - 08/31/19 1318      Knee/Hip Exercises: Aerobic   Recumbent Bike  L2 x 6 min               PT Short Term Goals - 06/01/19 1358      PT SHORT TERM GOAL #1   Title  Independent with initial HEP    Status  Achieved   06/01/19       PT Long Term Goals - 08/31/19 1318      PT LONG TERM GOAL #1   Title  Independent with advanced/ongoing HEP    Status  Partially Met    Target Date  10/19/19      PT LONG TERM GOAL #2   Title  B hip strength >/= 4/5 and B ankle strength >/= 3+/5 to 4-/5 for improved gait stability    Status  Partially Met    Target Date  10/19/19      PT LONG TERM GOAL #3   Title  Berg >/= 52/56 to reduce risk for falls    Status  Achieved   08/31/19     PT LONG TERM GOAL #4   Title  FGA >/= 23/30 to improve gait safety and stability    Status  On-going    Target Date  10/19/19      PT LONG TERM GOAL #5   Title  Pt will ambulate with normal gait pattern w/o evidence of B foot drop/slap    Status  Partially Met    Target Date  10/19/19            Plan - 08/31/19 1322    Clinical Impression Statement  Joydan pleased with her progress with PT thus far, noting decreased B foot slap and scissoring with walking, improved standing tolerance and balance, improved stability and confidence with gait and increased ease of transitional mobility especially getting up from the floor. B hip and ankle strength continues to improve with average MMT scores increased by at least  grade. Standardized balance testing also continues to improve with [name] goal now met (52/26 - was 44/56 on initial assessment), increasing gait speed to 3.21 ft/sec (was 2.83 ft/sec) and FGA improved to 22/30 (was 13/30 on initial assessment). Almarie continues to progress toward goals with most goals at least  partially met but patient continuing to express desire to continue PT until she has a chance to see how AFOs and orthotics work out which will be ready as of 09/09/19. Will recommend recert for additional 1x/wk x 4-6 weeks as of the new year to further address remaining deficits and assess gait safety and stability with new orthotics and AFO's.    Personal Factors and Comorbidities  Time since onset of injury/illness/exacerbation;Past/Current Experience;Comorbidity 3+    Comorbidities  neuropathy; hypthyroidism; DDD; remote h/o back surgery at age 35; h/o scoliosis    Examination-Activity Limitations  Bend;Locomotion Level;Squat;Stand    Examination-Participation Restrictions  Yard Work;Community Activity;Laundry;Cleaning;Driving    Rehab Potential  Good    PT Frequency  1x / week  PT Treatment/Interventions  ADLs/Self Care Home Management;Electrical Stimulation;Iontophoresis 50m/ml Dexamethasone;Moist Heat;Ultrasound;Gait training;Stair training;Functional mobility training;Therapeutic activities;Therapeutic exercise;Balance training;Neuromuscular re-education;Patient/family education;Orthotic Fit/Training;Manual techniques;Passive range of motion;Dry needling;Taping;Joint Manipulations    PT Next Visit Plan  progress B ankle ROM and proximal/distal LE strengthening, balance training, manual therapy including DN as indicated to normalize muscle tension and promote increased muscle activation    PT Home Exercise Plan  05/24/19 - gastroc/soleus/peroneal stretches, seated heel-toe raises; 05/27/19 - modified peroneal stretch, 4-way ankle with yellow TB, sidelying ankle inversion/eversion; 06/01/19 - toe & heel raise progression to standing with SLS eccentric lowering as tolerated, SLS, tandem stance & SLS clocks with intermittent UE support; 06/08/19 - posterior & lateral hip bumps, fwd wall falls; 06/17/19 - progression of standing heel raise, side stepping with looped yellow TB at midfoot, Airex pad balance;  06/22/19 - corner balance progression with Airex pad; 06/29/19 - standing 4-way SLR with red TB; 07/06/19 - quadruped hip extension & ER; 07/19/19 - supported lunge; 07/27/19 - foot instrinic strengthening; 08/03/19 - anterior tibialis stretching; 08/10/19 - ankle inversion isometrics    Consulted and Agree with Plan of Care  Patient       Patient will benefit from skilled therapeutic intervention in order to improve the following deficits and impairments:  Abnormal gait, Decreased activity tolerance, Decreased balance, Decreased coordination, Decreased endurance, Decreased knowledge of use of DME, Decreased mobility, Decreased range of motion, Decreased safety awareness, Decreased strength, Difficulty walking, Increased edema, Increased fascial restricitons, Increased muscle spasms, Impaired perceived functional ability, Impaired flexibility, Pain  Visit Diagnosis: Other abnormalities of gait and mobility  Foot drop, left  Foot drop, right  Unsteadiness on feet  Muscle weakness (generalized)     Problem List Patient Active Problem List   Diagnosis Date Noted  . Ventricular premature beats 10/31/2015  . Heart murmur 10/31/2015    JPercival Spanish PT, MPT 08/31/2019, 4:58 PM  [facility] [place] [address]Phone: [phone]  Fax:  [phone] Name: [name] CordoneMRN: [id number] of Birth: [birth year]

## 2019-09-14 ENCOUNTER — Encounter: Payer: Self-pay | Admitting: Physical Therapy

## 2019-09-14 ENCOUNTER — Other Ambulatory Visit: Payer: Self-pay

## 2019-09-14 ENCOUNTER — Ambulatory Visit: Payer: Medicare PPO | Attending: Orthopedic Surgery | Admitting: Physical Therapy

## 2019-09-14 DIAGNOSIS — M21371 Foot drop, right foot: Secondary | ICD-10-CM | POA: Diagnosis present

## 2019-09-14 DIAGNOSIS — R2689 Other abnormalities of gait and mobility: Secondary | ICD-10-CM

## 2019-09-14 DIAGNOSIS — R2681 Unsteadiness on feet: Secondary | ICD-10-CM | POA: Diagnosis present

## 2019-09-14 DIAGNOSIS — M6281 Muscle weakness (generalized): Secondary | ICD-10-CM | POA: Insufficient documentation

## 2019-09-14 DIAGNOSIS — M21372 Foot drop, left foot: Secondary | ICD-10-CM | POA: Diagnosis present

## 2019-09-14 NOTE — Therapy (Signed)
[facility] [address]  [place], [place], [address] Phone: [phone]   Fax:  [phone]  Physical Therapy Treatment  Patient Details  Name: [name] Trujillo MRN: [id number] Date of Birth: 04/16/1941 Referring Provider (PT): Wylene Simmer, MD   Encounter Date: 09/14/2019  PT End of Session - 09/14/19 1406    Visit Number  16    Number of Visits  21    Date for PT Re-Evaluation  10/19/19    Authorization Type  [employer] Medicare    PT Start Time  1406    PT Stop Time  1441    PT Time Calculation (min)  35 min    Activity Tolerance  Patient tolerated treatment well    Behavior During Therapy  [facility] for tasks assessed/performed       Past Medical History:  Diagnosis Date  . High cholesterol   . Hypothyroidism   . Neuropathy     Past Surgical History:  Procedure Laterality Date  . APPENDECTOMY     27-20 yo  . BACK SURGERY     Age 80  . CESAREAN SECTION    . ECTOPIC PREGNANCY SURGERY    . LAPAROSCOPIC HYSTERECTOMY      There were no vitals filed for this visit.  Subjective Assessment - 09/14/19 1408    Subjective  Pt reporting she received her L spring DF assist AFO and B orthoses from Mechele Claude at [employer] [facility] [place] on 09/09/19. She attempted 2-2.5 hr wearing schedule as recommend by the orthotist but reports limited tolerance for AFO due to rubbing on medial ankle creating a small sore as well as too much pressure on toes. She has order a  size larger shoe and has scheduled a f/u with Mechele Claude for tomorrow 09/15/19 to have AFO evaluated for pressure areas.    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  No/denies                       [facility] Adult PT Treatment/Exercise - 09/14/19 1406      Knee/Hip Exercises: Aerobic   Recumbent Bike  L2 x 6 min                PT Short Term Goals - 06/01/19 1358      PT SHORT TERM GOAL #1   Title  Independent with initial HEP    Status  Achieved   06/01/19       PT Long Term Goals - 08/31/19 1318      PT LONG TERM GOAL #1   Title  Independent with advanced/ongoing HEP    Status  Partially Met    Target Date  10/19/19      PT LONG TERM GOAL #2   Title  B hip strength >/= 4/5 and B ankle strength >/= 3+/5 to 4-/5 for improved gait stability    Status  Partially Met    Target Date  10/19/19      PT LONG TERM GOAL #3   Title  Berg >/= 52/56 to reduce risk for falls    Status  Achieved   08/31/19     PT LONG TERM GOAL #4   Title  FGA >/= 23/30 to improve gait safety and stability  Status  On-going    Target Date  10/19/19      PT LONG TERM GOAL #5   Title  Pt will ambulate with normal gait pattern w/o evidence of B foot drop/slap    Status  Partially Met    Target Date  10/19/19            Plan - 09/14/19 1409    Clinical Impression Statement  Jaimya arriving to PT with new L spring DF assist AFO and B orthoses which she received from Mechele Claude at [facility] on 09/09/19. She reports she has been unable to wear the AFO beyond the initial 2-hour trial period as it has rubbed a small sore just anterior to her medial malleolus and also notes too much pressure on her toes and has scheduled a f/u appointment with the orthotist for tomorrow. Assessed AFO fit and alignment with patient noted to be securing straps too tightly not only creating increased medial ankle pressure but also leaving a persistent mark at level of lower anterior strap - educated patient on appropriate tightening of straps leaving room to slide at least 1-2 fingers under strap and also encouraged patient to inquire with orthotist about potential padding for lower strap. She reports she has ordered a  size up for her current shoes which should alleviate the tightness in the toe box.  Nusaybah notes good control with DF assist with no LOB although full gait assessment deferred today due to pain in toes and desire to avoid worsening of sore on medial ankle. PT also looked at B orthotics at patient request, with R orthosis noted to be slightly thicker than L which patient reports was designed to allow her to wear the R orthosis with the L AFO - she plans to inquire if any accommodations need to be made to create level height when wearing B orthoses. She reports increased muscle cramping in her distal LEs after attempting to wear the orthoses for a trial period over the weekend but declined need for PT to assess or treat for this problem today.    Personal Factors and Comorbidities  Time since onset of injury/illness/exacerbation;Past/Current Experience;Comorbidity 3+    Comorbidities  neuropathy; hypthyroidism; DDD; remote h/o back surgery at age 37; h/o scoliosis    Examination-Activity Limitations  Bend;Locomotion Level;Squat;Stand    Examination-Participation Restrictions  Yard Work;Community Activity;Laundry;Cleaning;Driving    Rehab Potential  Good    PT Frequency  1x / week    PT Duration  4 weeks    PT Treatment/Interventions  ADLs/Self Care Home Management;Electrical Stimulation;Iontophoresis 50m/ml Dexamethasone;Moist Heat;Ultrasound;Gait training;Stair training;Functional mobility training;Therapeutic activities;Therapeutic exercise;Balance training;Neuromuscular re-education;Patient/family education;Orthotic Fit/Training;Manual techniques;Passive range of motion;Dry needling;Taping;Joint Manipulations    PT Next Visit Plan  f/u regarding AFO adjustment; progress B ankle ROM and proximal/distal LE strengthening, balance training, manual therapy including DN as indicated to normalize muscle tension and promote increased muscle activation    PT Home Exercise Plan  05/24/19 - gastroc/soleus/peroneal stretches, seated heel-toe raises; 05/27/19 - modified peroneal stretch, 4-way ankle  with yellow TB, sidelying ankle inversion/eversion; 06/01/19 - toe & heel raise progression to standing with SLS eccentric lowering as tolerated, SLS, tandem stance & SLS clocks with intermittent UE support; 06/08/19 - posterior & lateral hip bumps, fwd wall falls; 06/17/19 - progression of standing heel raise, side stepping with looped yellow TB at midfoot, Airex pad balance; 06/22/19 - corner balance progression with Airex pad; 06/29/19 - standing 4-way SLR with red TB; 07/06/19 - quadruped  hip extension & ER; 07/19/19 - supported lunge; 07/27/19 - foot instrinic strengthening; 08/03/19 - anterior tibialis stretching; 08/10/19 - ankle inversion isometrics    Consulted and Agree with Plan of Care  Patient       Patient will benefit from skilled therapeutic intervention in order to improve the following deficits and impairments:  Abnormal gait, Decreased activity tolerance, Decreased balance, Decreased coordination, Decreased endurance, Decreased knowledge of use of DME, Decreased mobility, Decreased range of motion, Decreased safety awareness, Decreased strength, Difficulty walking, Increased edema, Increased fascial restricitons, Increased muscle spasms, Impaired perceived functional ability, Impaired flexibility, Pain  Visit Diagnosis: Other abnormalities of gait and mobility  Foot drop, left  Foot drop, right  Unsteadiness on feet  Muscle weakness (generalized)     Problem List Patient Active Problem List   Diagnosis Date Noted  . Ventricular premature beats 10/31/2015  . Heart murmur 10/31/2015    Percival Spanish, PT, MPT 09/14/2019, 7:59 PM  [facility] [address]  [place], [place], [address] Phone: [phone]   Fax:  [phone]  Name: Sangeeta Youse MRN: [id number] Date of Birth: 1941/03/13

## 2019-09-21 ENCOUNTER — Other Ambulatory Visit: Payer: Self-pay

## 2019-09-21 ENCOUNTER — Ambulatory Visit: Payer: Medicare PPO | Admitting: Physical Therapy

## 2019-09-28 ENCOUNTER — Ambulatory Visit: Payer: Medicare PPO | Admitting: Physical Therapy

## 2019-10-05 ENCOUNTER — Ambulatory Visit: Payer: Medicare PPO | Admitting: Physical Therapy

## 2019-10-06 ENCOUNTER — Ambulatory Visit: Payer: Medicare PPO | Admitting: Physical Therapy

## 2019-10-12 ENCOUNTER — Encounter: Payer: Self-pay | Admitting: Physical Therapy

## 2019-10-12 ENCOUNTER — Ambulatory Visit: Payer: Medicare PPO | Attending: Orthopedic Surgery | Admitting: Physical Therapy

## 2019-10-12 ENCOUNTER — Other Ambulatory Visit: Payer: Self-pay

## 2019-10-12 DIAGNOSIS — R2681 Unsteadiness on feet: Secondary | ICD-10-CM | POA: Diagnosis present

## 2019-10-12 DIAGNOSIS — R2689 Other abnormalities of gait and mobility: Secondary | ICD-10-CM | POA: Diagnosis not present

## 2019-10-12 DIAGNOSIS — M21371 Foot drop, right foot: Secondary | ICD-10-CM | POA: Insufficient documentation

## 2019-10-12 DIAGNOSIS — M6281 Muscle weakness (generalized): Secondary | ICD-10-CM

## 2019-10-12 DIAGNOSIS — M21372 Foot drop, left foot: Secondary | ICD-10-CM

## 2019-10-12 NOTE — Therapy (Signed)
[facility] [address]  [place], [place], [address] Phone: [phone]   Fax:  [phone]  Physical Therapy Treatment  Patient Details  Name: [name] Trujillo MRN: [id number] Date of Birth: [birth year] Referring Provider (PT): Wylene Simmer, MD   Encounter Date: 10/12/2019  PT End of Session - 10/12/19 1400    Visit Number  17    Number of Visits  21    Date for PT Re-Evaluation  10/19/19    Authorization Type  [employer] Medicare    PT Start Time  1400    PT Stop Time  1446    PT Time Calculation (min)  46 min    Activity Tolerance  Patient tolerated treatment well    Behavior During Therapy  [facility] for tasks assessed/performed       Past Medical History:  Diagnosis Date  . High cholesterol   . Hypothyroidism   . Neuropathy     Past Surgical History:  Procedure Laterality Date  . APPENDECTOMY     13-18 yo  . BACK SURGERY     Age 79  . CESAREAN SECTION    . ECTOPIC PREGNANCY SURGERY    . LAPAROSCOPIC HYSTERECTOMY      There were no vitals filed for this visit.  Subjective Assessment - 10/12/19 1406    Subjective  Pt reporting she has decided to hold off on following up with the orthotist re: her AFO until she receives her 2nd round COVID vaccine. She notes she has been trying to increase her walking for exercise and has not noted any issues with toe clearance or tripping and feels that her foot slap is lessening. She also notes improving AROM in L DF but does express concern regarding more difficulty with inversion ROM and feels her ankle circles are becoming more stiff/sticky (not smooth motion).    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  No/denies                       [facility] Adult PT Treatment/Exercise - 10/12/19 1400      Knee/Hip Exercises: Aerobic   Recumbent Bike   L2 x 6.5 min      Manual Therapy   Manual Therapy  Soft tissue mobilization;Myofascial release;Passive ROM    Manual therapy comments  supine & R side lying    Soft tissue mobilization  STM to L flexor digitorum longus, anterior tibialis & peroneals    Myofascial Release  manual TPR, MFR and pin & stretch to L anterior tibialis and peroneals    Passive ROM  L ankle inversion/eversion with manual peroneal stretch x 30 sec      Ankle Exercises: Sidelying   Ankle Inversion  Left;AAROM;10 reps    Ankle Inversion Limitations  AAROM concentric with attempt at slow eccentric AROM      Ankle Exercises: Seated   Ankle Circles/Pumps  Left;AROM;10 reps    Ankle Circles/Pumps Limitations  CW/CCW circles - flattened medial arc but slight improvement in continuous motion       Trigger Point Dry Needling - 10/12/19 1400    Consent Given?  Yes    Muscles Treated Lower Quadrant  Peroneals;Anterior tibialis;Posterior tibialis;Flexor Digitorum Longus   Left   Peroneals Response  Twitch response elicited;Palpable increased muscle length  Anterior tibialis Response  Twitch response elicited;Palpable increased muscle length    Posterior tibialis Response  Twitch response elicited    Flexor digitorum longus Response  Twitch response elicited;Palpable increased muscle length             PT Short Term Goals - 06/01/19 1358      PT SHORT TERM GOAL #1   Title  Independent with initial HEP    Status  Achieved   06/01/19       PT Long Term Goals - 08/31/19 1318      PT LONG TERM GOAL #1   Title  Independent with advanced/ongoing HEP    Status  Partially Met    Target Date  10/19/19      PT LONG TERM GOAL #2   Title  B hip strength >/= 4/5 and B ankle strength >/= 3+/5 to 4-/5 for improved gait stability    Status  Partially Met    Target Date  10/19/19      PT LONG TERM GOAL #3   Title  Berg >/= 52/56 to reduce risk for falls    Status  Achieved   08/31/19     PT LONG TERM GOAL #4    Title  FGA >/= 23/30 to improve gait safety and stability    Status  On-going    Target Date  10/19/19      PT LONG TERM GOAL #5   Title  Pt will ambulate with normal gait pattern w/o evidence of B foot drop/slap    Status  Partially Met    Target Date  10/19/19            Plan - 10/12/19 1407    Clinical Impression Statement  [name] returning for first time in 3 weeks due missed appointments for family illness, therapist out and reactions to COVID vaccine. She reports consistent compliance with HEP and has been attempting to increase her walking for exercise.  She is pleased that she has not noted any issues with toe clearance or tripping and feels that her foot slap is lessening and feels that she is improving with her AROM DF on the L but is frustrated by what she perceives as worsening ankle circles and inversion recently. Increased muscle tension noted in L anterior tibialis and peroneals with decreased posterior tibialis activation noted - addressed these issues with manual therapy including DN as patient feels that this has helped in the past with patient noting improvement after manual therapy. Aysha requesting formal review of HEP to help her determine what exercises will be best to help her prevent further declines as we conclude her current PT episode, therefore will plan to initiate this next visit. Given missed visits, will consider recert if indicated to ensure patient comfortable with ongoing HEP prior to discharge.    Personal Factors and Comorbidities  Time since onset of injury/illness/exacerbation;Past/Current Experience;Comorbidity 3+    Comorbidities  neuropathy; hypthyroidism; DDD; remote h/o back surgery at age 79; h/o scoliosis    Examination-Activity Limitations  Bend;Locomotion Level;Squat;Stand    Examination-Participation Restrictions  Yard Work;Community Activity;Laundry;Cleaning;Driving    Rehab Potential  Good    PT Frequency  1x / week    PT Duration  4  weeks    PT Treatment/Interventions  ADLs/Self Care Home Management;Electrical Stimulation;Iontophoresis '4mg'$ /ml Dexamethasone;Moist Heat;Ultrasound;Gait training;Stair training;Functional mobility training;Therapeutic activities;Therapeutic exercise;Balance training;Neuromuscular re-education;Patient/family education;Orthotic Fit/Training;Manual techniques;Passive range of motion;Dry needling;Taping;Joint Manipulations    PT Next Visit Plan  formal HEP review to  identify most appropriate onoing exercised for  B ankle ROM and proximal/distal LE strengthening, balance training;  manual therapy including DN as indicated to normalize muscle tension and promote increased muscle activation    PT Home Exercise Plan  05/24/19 - gastroc/soleus/peroneal stretches, seated heel-toe raises; 05/27/19 - modified peroneal stretch, 4-way ankle with yellow TB, sidelying ankle inversion/eversion; 06/01/19 - toe & heel raise progression to standing with SLS eccentric lowering as tolerated, SLS, tandem stance & SLS clocks with intermittent UE support; 06/08/19 - posterior & lateral hip bumps, fwd wall falls; 06/17/19 - progression of standing heel raise, side stepping with looped yellow TB at midfoot, Airex pad balance; 06/22/19 - corner balance progression with Airex pad; 06/29/19 - standing 4-way SLR with red TB; 07/06/19 - quadruped hip extension & ER; 07/19/19 - supported lunge; 07/27/19 - foot instrinic strengthening; 08/03/19 - anterior tibialis stretching; 08/10/19 - ankle inversion isometrics    Consulted and Agree with Plan of Care  Patient       Patient will benefit from skilled therapeutic intervention in order to improve the following deficits and impairments:  Abnormal gait, Decreased activity tolerance, Decreased balance, Decreased coordination, Decreased endurance, Decreased knowledge of use of DME, Decreased mobility, Decreased range of motion, Decreased safety awareness, Decreased strength, Difficulty walking,  Increased edema, Increased fascial restricitons, Increased muscle spasms, Impaired perceived functional ability, Impaired flexibility, Pain  Visit Diagnosis: Other abnormalities of gait and mobility  Foot drop, left  Foot drop, right  Unsteadiness on feet  Muscle weakness (generalized)     Problem List Patient Active Problem List   Diagnosis Date Noted  . Ventricular premature beats 10/31/2015  . Heart murmur 10/31/2015    Percival Spanish, PT, MPT 10/12/2019, 4:50 PM  [facility] [address]  [place], [place], [address] Phone: [phone]   Fax:  [phone]  Name: Shynice Sigel MRN: [id number] Date of Birth: November 14, 1940

## 2019-10-19 ENCOUNTER — Ambulatory Visit: Payer: Medicare PPO | Admitting: Physical Therapy

## 2019-10-19 ENCOUNTER — Other Ambulatory Visit: Payer: Self-pay

## 2019-10-19 ENCOUNTER — Encounter: Payer: Self-pay | Admitting: Physical Therapy

## 2019-10-19 DIAGNOSIS — R2689 Other abnormalities of gait and mobility: Secondary | ICD-10-CM

## 2019-10-19 DIAGNOSIS — R2681 Unsteadiness on feet: Secondary | ICD-10-CM

## 2019-10-19 DIAGNOSIS — M21371 Foot drop, right foot: Secondary | ICD-10-CM

## 2019-10-19 DIAGNOSIS — M21372 Foot drop, left foot: Secondary | ICD-10-CM

## 2019-10-19 DIAGNOSIS — M6281 Muscle weakness (generalized): Secondary | ICD-10-CM

## 2019-10-19 NOTE — Therapy (Signed)
[facility] [address]  [place], [place], [address] Phone: [phone]   Fax:  [phone]  Physical Therapy Treatment / Recert  Patient Details  Name: [name] Trujillo MRN: [id number] Date of Birth: [birth year] Referring Provider (PT): Wylene Simmer, MD  Progress Note  Reporting Period 08/31/2019 to 10/19/2019  See note below for Objective Data and Assessment of Progress/Goals.     Encounter Date: 10/19/2019  PT End of Session - 10/19/19 1401    Visit Number  18    Number of Visits  24    Date for PT Re-Evaluation  11/30/19    Authorization Type  [employer] Medicare    PT Start Time  1401    PT Stop Time  1454    PT Time Calculation (min)  53 min    Activity Tolerance  Patient tolerated treatment well    Behavior During Therapy  WFL for tasks assessed/performed       Past Medical History:  Diagnosis Date  . High cholesterol   . Hypothyroidism   . Neuropathy     Past Surgical History:  Procedure Laterality Date  . APPENDECTOMY     46-18 yo  . BACK SURGERY     Age 10  . CESAREAN SECTION    . ECTOPIC PREGNANCY SURGERY    . LAPAROSCOPIC HYSTERECTOMY      There were no vitals filed for this visit.  Subjective Assessment - 10/19/19 1404    Subjective  Pt reporting she feels like the DN helped again last visit - now able to pick the marbles again with her toes. Feels like she has better eccentric DF control with walking with less foot slap and able to walk faster. Also notes better endurance with walking and able to maintain better posture.    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  No/denies         [facility] PT Assessment - 10/19/19 1401      Assessment   Medical Diagnosis  Gait abnormality secondary to B foot drop    Referring Provider (PT)  Wylene Simmer, MD    Next MD Visit  none  scheduled      Prior Function   Level of Independence  Independent    Vocation  Retired    Leisure  working in the yard, walking ~3x/wk      AROM   Right Ankle Dorsiflexion  8    Right Ankle Plantar Flexion  60    Right Ankle Inversion  24    Right Ankle Eversion  33    Left Ankle Dorsiflexion  0    Left Ankle Plantar Flexion  54    Left Ankle Inversion  5    Left Ankle Eversion  25      PROM   Right Ankle Dorsiflexion  12    Left Ankle Dorsiflexion  12      Strength   Right Hip Flexion  4+/5    Right Hip Extension  4+/5    Right Hip External Rotation   4-/5    Right Hip Internal Rotation  4/5    Right Hip ABduction  4/5    Right Hip ADduction  4/5    Left Hip Flexion  5/5    Left Hip Extension  4+/5  Left Hip External Rotation  4/5    Left Hip Internal Rotation  4/5    Left Hip ABduction  4+/5    Left Hip ADduction  4/5    Right Knee Flexion  5/5    Right Knee Extension  5/5    Left Knee Flexion  5/5    Left Knee Extension  5/5    Right Ankle Dorsiflexion  4+/5    Right Ankle Plantar Flexion  4-/5    Right Ankle Inversion  4/5    Right Ankle Eversion  4+/5    Left Ankle Dorsiflexion  3+/5    Left Ankle Plantar Flexion  3+/5    Left Ankle Inversion  2+/5    Left Ankle Eversion  4/5      Ambulation/Gait   Assistive device  None    Gait Pattern  Step-through pattern;Decreased dorsiflexion - left;Decreased dorsiflexion - right    Ambulation Surface  Level;Indoor    Gait velocity  2.99 ft/sec    Gait Comments  intermittent L foot slap with limited eccentric DF control and occasional scissoring with LOB during high-level gait activities      Standardized Balance Assessment   10 Meter Walk  10.97      Functional Gait  Assessment   Gait Level Surface  Walks 20 ft in less than 7 sec but greater than 5.5 sec, uses assistive device, slower speed, mild gait deviations, or deviates 6-10 in outside of the 12 in walkway width.    Change in Gait Speed  Able to smoothly  change walking speed without loss of balance or gait deviation. Deviate no more than 6 in outside of the 12 in walkway width.    Gait with Horizontal Head Turns  Performs head turns smoothly with no change in gait. Deviates no more than 6 in outside 12 in walkway width    Gait with Vertical Head Turns  Performs task with slight change in gait velocity (eg, minor disruption to smooth gait path), deviates 6 - 10 in outside 12 in walkway width or uses assistive device    Gait and Pivot Turn  Pivot turns safely within 3 sec and stops quickly with no loss of balance.    Step Over Obstacle  Is able to step over 2 stacked shoe boxes taped together (9 in total height) without changing gait speed. No evidence of imbalance.    Gait with Narrow Base of Support  Ambulates less than 4 steps heel to toe or cannot perform without assistance.    Gait with Eyes Closed  Walks 20 ft, slow speed, abnormal gait pattern, evidence for imbalance, deviates 10-15 in outside 12 in walkway width. Requires more than 9 sec to ambulate 20 ft.    Ambulating Backwards  Walks 20 ft, no assistive devices, good speed, no evidence for imbalance, normal gait    Steps  Alternating feet, no rail.    Total Score  23    FGA comment:  19-24 = medium risk fall                   [facility] Adult PT Treatment/Exercise - 10/19/19 1401      Knee/Hip Exercises: Aerobic   Recumbent Bike  L2 x 6 min    Nustep  L4 x 2 min   discontinued d/t R hip discomfort            PT Education - 10/19/19 1454    Education Details  Initated review of existing  HEP helping pt identifiy most relevant exercises to address ongoing deficits    Person(s) Educated  Patient    Methods  Explanation    Comprehension  Verbalized understanding       PT Short Term Goals - 06/01/19 1358      PT SHORT TERM GOAL #1   Title  Independent with initial HEP    Status  Achieved   06/01/19       PT Long Term Goals - 10/19/19 1412      PT LONG TERM  GOAL #1   Title  Independent with advanced/ongoing HEP    Status  Partially Met    Target Date  11/30/19      PT LONG TERM GOAL #2   Title  B hip strength >/= 4/5 and B ankle strength >/= 3+/5 to 4-/5 for improved gait stability    Status  Partially Met    Target Date  11/30/19      PT LONG TERM GOAL #3   Title  Berg >/= 52/56 to reduce risk for falls    Status  Achieved   08/31/19     PT LONG TERM GOAL #4   Title  FGA >/= 25/30 to improve gait safety and stability    Status  Revised   10/19/19 - initial goal of 23/30 met, goal revised to 25/30   Target Date  11/30/19      PT LONG TERM GOAL #5   Title  Pt will ambulate with normal gait pattern w/o evidence of B foot drop/slap    Status  Partially Met    Target Date  11/30/19            Plan - 10/19/19 1454    Clinical Impression Statement  [name] Trujillo feels like she is continuing to improve. She notes benefit from latest DN session with improved intrinsic control in feet as well as better AROM and eccentric DF control, although continues to be frustrated by lack of inversion control. She reports she is trying to walk for exercise on a consistent basis and notes her walking tolerance is improving, with better postural control, increased speed, and less foot slap. LE strength continues to improve with greatest weakness remaining in L foot and R hip.  Balance continues to improve with FGA goal of 23/30 now met and goal revised to reflect potential for further improvement. Patient missed 3 visits in current certification period due to family illness, therapist out and reactions to COVID vaccine, and has expressed interest in extending PT POC to make up for missed visits to allow for further strengthening and balance training and customization of long-term HEP as well as follow up with orthotist regarding AFO and neuro consultation to hopefully better understand the etiology of her neuropathy. Hence will plan for recert for additional 1x/wk x  4-6 weeks.    Personal Factors and Comorbidities  Time since onset of injury/illness/exacerbation;Past/Current Experience;Comorbidity 3+    Comorbidities  neuropathy; hypthyroidism; DDD; remote h/o back surgery at age 39; h/o scoliosis    Examination-Activity Limitations  Bend;Locomotion Level;Squat;Stand    Examination-Participation Restrictions  Yard Work;Community Activity;Laundry;Cleaning;Driving    Rehab Potential  Good    PT Frequency  1x / week    PT Duration  4 weeks    PT Treatment/Interventions  ADLs/Self Care Home Management;Electrical Stimulation;Iontophoresis 34m/ml Dexamethasone;Moist Heat;Ultrasound;Gait training;Stair training;Functional mobility training;Therapeutic activities;Therapeutic exercise;Balance training;Neuromuscular re-education;Patient/family education;Orthotic Fit/Training;Manual techniques;Passive range of motion;Dry needling;Taping;Joint Manipulations    PT Next Visit Plan  formal  HEP review to identify most appropriate long-term exercises for  B ankle ROM and proximal/distal LE strengthening; balance and gait training; manual therapy including DN as indicated to normalize muscle tension and promote increased muscle activation    PT Home Exercise Plan  05/24/19 - gastroc/soleus/peroneal stretches, seated heel-toe raises; 05/27/19 - modified peroneal stretch, 4-way ankle with yellow TB, sidelying ankle inversion/eversion; 06/01/19 - toe & heel raise progression to standing with SLS eccentric lowering as tolerated, SLS, tandem stance & SLS clocks with intermittent UE support; 06/08/19 - posterior & lateral hip bumps, fwd wall falls; 06/17/19 - progression of standing heel raise, side stepping with looped yellow TB at midfoot, Airex pad balance; 06/22/19 - corner balance progression with Airex pad; 06/29/19 - standing 4-way SLR with red TB; 07/06/19 - quadruped hip extension & ER; 07/19/19 - supported lunge; 07/27/19 - foot instrinic strengthening; 08/03/19 - anterior tibialis  stretching; 08/10/19 - ankle inversion isometrics    Consulted and Agree with Plan of Care  Patient       Patient will benefit from skilled therapeutic intervention in order to improve the following deficits and impairments:  Abnormal gait, Decreased activity tolerance, Decreased balance, Decreased coordination, Decreased endurance, Decreased knowledge of use of DME, Decreased mobility, Decreased range of motion, Decreased safety awareness, Decreased strength, Difficulty walking, Increased edema, Increased fascial restricitons, Increased muscle spasms, Impaired perceived functional ability, Impaired flexibility, Pain  Visit Diagnosis: Other abnormalities of gait and mobility - Plan: PT plan of care cert/re-cert  Foot drop, left - Plan: PT plan of care cert/re-cert  Foot drop, right - Plan: PT plan of care cert/re-cert  Unsteadiness on feet - Plan: PT plan of care cert/re-cert  Muscle weakness (generalized) - Plan: PT plan of care cert/re-cert     Problem List Patient Active Problem List   Diagnosis Date Noted  . Ventricular premature beats 10/31/2015  . Heart murmur 10/31/2015    Percival Spanish, PT, MPT 10/19/2019, 6:03 PM  [facility] [address]  [place], [place], [address] Phone: [phone]   Fax:  [phone]  Name: [name] Trujillo MRN: [id number] Date of Birth: [birth year]

## 2019-10-28 ENCOUNTER — Ambulatory Visit: Payer: Medicare PPO | Admitting: Physical Therapy

## 2019-11-02 ENCOUNTER — Ambulatory Visit: Payer: Medicare PPO | Admitting: Physical Therapy

## 2019-11-02 ENCOUNTER — Other Ambulatory Visit: Payer: Self-pay

## 2019-11-02 ENCOUNTER — Encounter: Payer: Self-pay | Admitting: Physical Therapy

## 2019-11-02 DIAGNOSIS — M6281 Muscle weakness (generalized): Secondary | ICD-10-CM

## 2019-11-02 DIAGNOSIS — M21371 Foot drop, right foot: Secondary | ICD-10-CM

## 2019-11-02 DIAGNOSIS — R2689 Other abnormalities of gait and mobility: Secondary | ICD-10-CM | POA: Diagnosis not present

## 2019-11-02 DIAGNOSIS — M21372 Foot drop, left foot: Secondary | ICD-10-CM

## 2019-11-02 DIAGNOSIS — R2681 Unsteadiness on feet: Secondary | ICD-10-CM

## 2019-11-02 NOTE — Therapy (Signed)
[facility] [place] [address]  [place], [place], [address] Phone: [phone]   Fax:  [phone]  Physical Therapy Treatment  Patient Details  Name: [name] Trujillo MRN: [id number] Date of Birth: March 24, 1941 Referring Provider (PT): Wylene Simmer, MD   Encounter Date: 11/02/2019  PT End of Session - 11/02/19 1400    Visit Number  19    Number of Visits  24    Date for PT Re-Evaluation  11/30/19    Authorization Type  [employer] Medicare    PT Start Time  1400    PT Stop Time  1444    PT Time Calculation (min)  44 min    Activity Tolerance  Patient tolerated treatment well    Behavior During Therapy  [facility] for tasks assessed/performed       Past Medical History:  Diagnosis Date  . High cholesterol   . Hypothyroidism   . Neuropathy     Past Surgical History:  Procedure Laterality Date  . APPENDECTOMY     76-20 yo  . BACK SURGERY     Age 89  . CESAREAN SECTION    . ECTOPIC PREGNANCY SURGERY    . LAPAROSCOPIC HYSTERECTOMY      There were no vitals filed for this visit.  Subjective Assessment - 11/02/19 1405    Subjective  Pt reporting temporary worsening of er R LE numbess with decreased motor control yesterday (unable to pick up the marbles with her toes) with unknown trigger.    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  No/denies                       [facility] Adult PT Treatment/Exercise - 11/02/19 1400      Knee/Hip Exercises: Stretches   Piriformis Stretch  Right;Left;30 seconds;3 reps    Piriformis Stretch Limitations  supine figure 4 with overpressure, KTOS & figure 4 to chest - pt reporting increased pain with the former, but latter 2 stretches well tolerated      Knee/Hip Exercises: Aerobic   Recumbent Bike  L2 x 6 min      Knee/Hip Exercises: Supine   Other Supine  Knee/Hip Exercises  Hooklying alt hip ABD/ER clam with red TB x 10      Knee/Hip Exercises: Sidelying   Clams  R clam with red TB x 10      Manual Therapy   Manual Therapy  Soft tissue mobilization;Myofascial release    Manual therapy comments  L side lying    Soft tissue mobilization  STM/DTM to R glutes and piriformis - ttp t/o with "jump sign" and reproduction of yesterday's numbness with pressure over mid piriformis    Myofascial Release  manual TPR and pin & stretch to R piriformis               PT Short Term Goals - 06/01/19 1358      PT SHORT TERM GOAL #1   Title  Independent with initial HEP    Status  Achieved   06/01/19       PT Long Term Goals - 10/19/19 1412      PT LONG TERM GOAL #1   Title  Independent with advanced/ongoing HEP    Status  Partially Met    Target  Date  11/30/19      PT LONG TERM GOAL #2   Title  B hip strength >/= 4/5 and B ankle strength >/= 3+/5 to 4-/5 for improved gait stability    Status  Partially Met    Target Date  11/30/19      PT LONG TERM GOAL #3   Title  Berg >/= 52/56 to reduce risk for falls    Status  Achieved   08/31/19     PT LONG TERM GOAL #4   Title  FGA >/= 25/30 to improve gait safety and stability    Status  Revised   10/19/19 - initial goal of 23/30 met, goal revised to 25/30   Target Date  11/30/19      PT LONG TERM GOAL #5   Title  Pt will ambulate with normal gait pattern w/o evidence of B foot drop/slap    Status  Partially Met    Target Date  11/30/19            Plan - 11/02/19 1444    Clinical Impression Statement  [name] Trujillo expressing concern about temporary worsening R LE numbness and loss of distal LE motor control yesterday w/o known triggering event which now seems to be resolving. She verbalizes that she is attempting to reschedule her previously cancelled EMG/NCV study in hopes of getting better understanding of what is causing her LE numbness and weakness. She did note some increased pain  in R buttock yesterday when worsening numbness and decreased motor control present with mild discomfort still present today - palpation revealing increased muscle tension in glutes and piriformis with pressure over piriformis resulting in "jump sign" and reproduction of LE numbness. Addressed increased muscle tension with manual STM/DTM and MFR followed but stretching and strengthening exercises to further promote normal muscle activity. [name] Trujillo will benefit from continued skilled PT for further strengthening and balance training along with customization of long-term HEP.    Personal Factors and Comorbidities  Time since onset of injury/illness/exacerbation;Past/Current Experience;Comorbidity 3+    Comorbidities  neuropathy; hypthyroidism; DDD; remote h/o back surgery at age 62; h/o scoliosis    Examination-Activity Limitations  Bend;Locomotion Level;Squat;Stand    Examination-Participation Restrictions  Yard Work;Community Activity;Laundry;Cleaning;Driving    Rehab Potential  Good    PT Frequency  1x / week    PT Duration  4 weeks    PT Treatment/Interventions  ADLs/Self Care Home Management;Electrical Stimulation;Iontophoresis '4mg'$ /ml Dexamethasone;Moist Heat;Ultrasound;Gait training;Stair training;Functional mobility training;Therapeutic activities;Therapeutic exercise;Balance training;Neuromuscular re-education;Patient/family education;Orthotic Fit/Training;Manual techniques;Passive range of motion;Dry needling;Taping;Joint Manipulations    PT Next Visit Plan  formal HEP review to identify most appropriate long-term exercises for  B ankle ROM and proximal/distal LE strengthening; balance and gait training; manual therapy including DN as indicated to normalize muscle tension and promote increased muscle activation    PT Home Exercise Plan  05/24/19 - gastroc/soleus/peroneal stretches, seated heel-toe raises; 05/27/19 - modified peroneal stretch, 4-way ankle with yellow TB, sidelying ankle  inversion/eversion; 06/01/19 - toe & heel raise progression to standing with SLS eccentric lowering as tolerated, SLS, tandem stance & SLS clocks with intermittent UE support; 06/08/19 - posterior & lateral hip bumps, fwd wall falls; 06/17/19 - progression of standing heel raise, side stepping with looped yellow TB at midfoot, Airex pad balance; 06/22/19 - corner balance progression with Airex pad; 06/29/19 - standing 4-way SLR with red TB; 07/06/19 - quadruped hip extension & ER; 07/19/19 - supported lunge; 07/27/19 - foot instrinic strengthening; 08/03/19 - anterior tibialis stretching; 08/10/19 -  ankle inversion isometrics    Consulted and Agree with Plan of Care  Patient       Patient will benefit from skilled therapeutic intervention in order to improve the following deficits and impairments:  Abnormal gait, Decreased activity tolerance, Decreased balance, Decreased coordination, Decreased endurance, Decreased knowledge of use of DME, Decreased mobility, Decreased range of motion, Decreased safety awareness, Decreased strength, Difficulty walking, Increased edema, Increased fascial restricitons, Increased muscle spasms, Impaired perceived functional ability, Impaired flexibility, Pain  Visit Diagnosis: Other abnormalities of gait and mobility  Foot drop, left  Foot drop, right  Unsteadiness on feet  Muscle weakness (generalized)     Problem List Patient Active Problem List   Diagnosis Date Noted  . Ventricular premature beats 10/31/2015  . Heart murmur 10/31/2015    Percival Spanish, PT, MPT 11/02/2019, 6:04 PM  [facility] [address]  [place], [place], [address] Phone: [phone]   Fax:  [phone]  Name: [name] Trujillo MRN: [id number] Date of Birth: 1941/06/03

## 2019-11-09 ENCOUNTER — Ambulatory Visit: Payer: Medicare PPO | Admitting: Physical Therapy

## 2019-11-16 ENCOUNTER — Other Ambulatory Visit: Payer: Self-pay

## 2019-11-16 ENCOUNTER — Ambulatory Visit: Payer: Medicare PPO | Attending: Orthopedic Surgery | Admitting: Physical Therapy

## 2019-11-16 ENCOUNTER — Encounter: Payer: Self-pay | Admitting: Physical Therapy

## 2019-11-16 DIAGNOSIS — M21371 Foot drop, right foot: Secondary | ICD-10-CM

## 2019-11-16 DIAGNOSIS — M21372 Foot drop, left foot: Secondary | ICD-10-CM | POA: Diagnosis present

## 2019-11-16 DIAGNOSIS — R2681 Unsteadiness on feet: Secondary | ICD-10-CM | POA: Diagnosis present

## 2019-11-16 DIAGNOSIS — M6281 Muscle weakness (generalized): Secondary | ICD-10-CM | POA: Insufficient documentation

## 2019-11-16 DIAGNOSIS — R2689 Other abnormalities of gait and mobility: Secondary | ICD-10-CM | POA: Diagnosis not present

## 2019-11-16 NOTE — Patient Instructions (Addendum)
    Home exercise program created by Oanh Devivo, PT.  For questions, please contact Ikey Omary via phone at [phone] or email at [email or web address]  [facility] [place] [address]  [address] [place], [place], [address] Phone: [phone]   Fax:  [phone]    

## 2019-11-16 NOTE — Therapy (Signed)
[facility] [place] [address]  [place], [place], [address] Phone: [phone]   Fax:  [phone]  Physical Therapy Treatment  Patient Details  Name: [name] Trujillo MRN: [id number] Date of Birth: [birth year] Referring Provider (PT): Wylene Simmer, MD   Encounter Date: 11/16/2019  PT End of Session - 11/16/19 1406    Visit Number  20    Number of Visits  24    Date for PT Re-Evaluation  11/30/19    Authorization Type  [employer] Medicare    PT Start Time  1406    PT Stop Time  1453    PT Time Calculation (min)  47 min    Activity Tolerance  Patient tolerated treatment well    Behavior During Therapy  [facility] for tasks assessed/performed       Past Medical History:  Diagnosis Date  . High cholesterol   . Hypothyroidism   . Neuropathy     Past Surgical History:  Procedure Laterality Date  . APPENDECTOMY     77-20 yo  . BACK SURGERY     Age 79  . CESAREAN SECTION    . ECTOPIC PREGNANCY SURGERY    . LAPAROSCOPIC HYSTERECTOMY      There were no vitals filed for this visit.  Subjective Assessment - 11/16/19 1409    Subjective  Pt reporting she is up to walking ~1 mile with limit more due to feeling of unsteadiness due to vestibular type issues than due to foot and ankle issues.    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    Patient Stated Goals  "To walk with better control of my feet"         [facility] PT Assessment - 11/16/19 1406      Assessment   Medical Diagnosis  Gait abnormality secondary to B foot drop    Referring Provider (PT)  Wylene Simmer, MD    Next MD Visit  11/19/19 with Derinda Late, MD      Strength   Right Hip Flexion  4+/5    Right Hip Extension  5/5    Right Hip External Rotation   4/5    Right Hip Internal Rotation  4/5    Right Hip ABduction  4/5    Right Hip ADduction  4/5    Left Hip Flexion  5/5    Left Hip Extension  5/5    Left Hip External Rotation   4/5    Left Hip Internal Rotation  4/5    Left Hip ABduction  4+/5    Left Hip ADduction  4/5    Right Knee Flexion  5/5    Right Knee Extension  5/5    Left Knee Flexion  5/5    Left Knee Extension  5/5    Right Ankle Dorsiflexion  4/5    Right Ankle Plantar Flexion  4-/5    Right Ankle Inversion  4/5    Right Ankle Eversion  4+/5    Left Ankle Dorsiflexion  3+/5    Left Ankle Plantar Flexion  3+/5    Left Ankle Inversion  2+/5    Left Ankle Eversion  4/5                   [facility] Adult PT Treatment/Exercise - 11/16/19 1406      Ambulation/Gait   Assistive device  None    Gait Pattern  Step-through pattern;Decreased  dorsiflexion - left;Decreased dorsiflexion - right    Ambulation Surface  Level;Indoor    Gait Comments  Pt continues to have intermittent periods of increased L foot drop/slap but only vary rarely on R.      Knee/Hip Exercises: Stretches   Piriformis Stretch  Right;Left;30 seconds;1 rep    Piriformis Stretch Limitations  supine KTOS with opp LE straight      Knee/Hip Exercises: Aerobic   Recumbent Bike  L2 x 6 min      Knee/Hip Exercises: Supine   Other Supine Knee/Hip Exercises  Hooklying alt hip ABD/ER clam with red TB x 10               PT Short Term Goals - 06/01/19 1358      PT SHORT TERM GOAL #1   Title  Independent with initial HEP    Status  Achieved   06/01/19       PT Long Term Goals - 11/16/19 1445      PT LONG TERM GOAL #1   Title  Independent with advanced/ongoing HEP    Status  Partially Met      PT LONG TERM GOAL #2   Title  B hip strength >/= 4/5 and B ankle strength >/= 3+/5 to 4-/5 for improved gait stability    Status  Partially Met   11/16/19 - met for hips, met for ankles except L inversion     PT LONG TERM GOAL #3   Title  Berg >/= 52/56 to reduce risk for falls    Status  Achieved   08/31/19     PT LONG TERM GOAL #4   Title  FGA >/= 25/30 to improve gait safety and stability    Status  On-going    10/19/19 - initial goal of 23/30 met, goal revised to 25/30     PT LONG TERM GOAL #5   Title  Pt will ambulate with normal gait pattern w/o evidence of B foot drop/slap    Status  Partially Met   11/16/19 - no foot drop/slap on R but remains intermittent on L           Plan - 11/16/19 1413    Clinical Impression Statement  [name] Trujillo is pleased with her progress with PT noting improved ability to get up from/down to floor, improved balance and improved walking tolerance up to a mile without sensation of catching her foot or tripping although still notes intermittent L foot slap without predictable trigger, and at times feels unsure of her balance due to potential vestibular dysfunction (sensation of dizziness or slow response with sudden head turns or changes of position). She remains frustrated by lack of knowledge as to what is contributing to her ongoing loss of sensation and plans to follow up with her PCP on 11/19/19 and potentially pursue neuro consultation +/- follow up with previously planned NCV/EMG testing. Her PT goals are mostly met or nearly met for current episode and we plan to use remaining visits to condense and prioritize her HEP. Given recent apparent vestibular issues, [name] Trujillo may benefit from a formal vestibular eval and plans to ask her PCP about this at her visit on Friday.    Personal Factors and Comorbidities  Time since onset of injury/illness/exacerbation;Past/Current Experience;Comorbidity 3+    Comorbidities  neuropathy; hypthyroidism; DDD; remote h/o back surgery at age 41; h/o scoliosis    Examination-Activity Limitations  Bend;Locomotion Level;Squat;Stand    Examination-Participation Restrictions  Yard Work;Community Activity;Laundry;Cleaning;Driving  Rehab Potential  Good    PT Frequency  1x / week    PT Duration  4 weeks    PT Treatment/Interventions  ADLs/Self Care Home Management;Electrical Stimulation;Iontophoresis '4mg'$ /ml Dexamethasone;Moist Heat;Ultrasound;Gait  training;Stair training;Functional mobility training;Therapeutic activities;Therapeutic exercise;Balance training;Neuromuscular re-education;Patient/family education;Orthotic Fit/Training;Manual techniques;Passive range of motion;Dry needling;Taping;Joint Manipulations    PT Next Visit Plan  formal HEP review to identify most appropriate long-term exercises for  B ankle ROM and proximal/distal LE strengthening; balance and gait training; manual therapy including DN as indicated to normalize muscle tension and promote increased muscle activation    PT Home Exercise Plan  05/24/19 - gastroc/soleus/peroneal stretches, seated heel-toe raises; 05/27/19 - modified peroneal stretch, 4-way ankle with yellow TB, sidelying ankle inversion/eversion; 06/01/19 - toe & heel raise progression to standing with SLS eccentric lowering as tolerated, SLS, tandem stance & SLS clocks with intermittent UE support; 06/08/19 - posterior & lateral hip bumps, fwd wall falls; 06/17/19 - progression of standing heel raise, side stepping with looped yellow TB at midfoot, Airex pad balance; 06/22/19 - corner balance progression with Airex pad; 06/29/19 - standing 4-way SLR with red TB; 07/06/19 - quadruped hip extension & ER; 07/19/19 - supported lunge; 07/27/19 - foot instrinic strengthening; 08/03/19 - anterior tibialis stretching; 08/10/19 - ankle inversion isometrics    Consulted and Agree with Plan of Care  Patient       Patient will benefit from skilled therapeutic intervention in order to improve the following deficits and impairments:  Abnormal gait, Decreased activity tolerance, Decreased balance, Decreased coordination, Decreased endurance, Decreased knowledge of use of DME, Decreased mobility, Decreased range of motion, Decreased safety awareness, Decreased strength, Difficulty walking, Increased edema, Increased fascial restricitons, Increased muscle spasms, Impaired perceived functional ability, Impaired flexibility, Pain  Visit  Diagnosis: Other abnormalities of gait and mobility  Foot drop, left  Foot drop, right  Unsteadiness on feet  Muscle weakness (generalized)     Problem List Patient Active Problem List   Diagnosis Date Noted  . Ventricular premature beats 10/31/2015  . Heart murmur 10/31/2015    Percival Spanish, PT, MPT 11/16/2019, 6:02 PM  [facility] [address]  [place], [place], [address] Phone: [phone]   Fax:  [phone]  Name: Meckenzie Balsley MRN: [id number] Date of Birth: Jan 21, 1941

## 2019-11-23 ENCOUNTER — Ambulatory Visit: Payer: Medicare PPO | Admitting: Physical Therapy

## 2019-11-23 ENCOUNTER — Encounter: Payer: Self-pay | Admitting: Physical Therapy

## 2019-11-23 ENCOUNTER — Other Ambulatory Visit: Payer: Self-pay

## 2019-11-23 DIAGNOSIS — R2681 Unsteadiness on feet: Secondary | ICD-10-CM

## 2019-11-23 DIAGNOSIS — M21372 Foot drop, left foot: Secondary | ICD-10-CM

## 2019-11-23 DIAGNOSIS — R2689 Other abnormalities of gait and mobility: Secondary | ICD-10-CM | POA: Diagnosis not present

## 2019-11-23 DIAGNOSIS — M6281 Muscle weakness (generalized): Secondary | ICD-10-CM

## 2019-11-23 DIAGNOSIS — M21371 Foot drop, right foot: Secondary | ICD-10-CM

## 2019-11-23 NOTE — Therapy (Addendum)
[facility] [address]  [place], [place], [address] Phone: [phone]   Fax:  [phone]  Physical Therapy Treatment / Discharge Summary  Patient Details  Name: [name] Trujillo MRN: [id number] Date of Birth: 10-22-1940 Referring Provider (PT): Wylene Simmer, MD   Encounter Date: 11/23/2019  PT End of Session - 11/23/19 1402    Visit Number  21    Number of Visits  24    Date for PT Re-Evaluation  11/30/19    Authorization Type  [employer] Medicare    PT Start Time  1402    PT Stop Time  1446    PT Time Calculation (min)  44 min    Activity Tolerance  Patient tolerated treatment well    Behavior During Therapy  [facility] for tasks assessed/performed       Past Medical History:  Diagnosis Date  . High cholesterol   . Hypothyroidism   . Neuropathy     Past Surgical History:  Procedure Laterality Date  . APPENDECTOMY     23-20 yo  . BACK SURGERY     Age 76  . CESAREAN SECTION    . ECTOPIC PREGNANCY SURGERY    . LAPAROSCOPIC HYSTERECTOMY      There were no vitals filed for this visit.  Subjective Assessment - 11/23/19 1405    Subjective  Pt brought her HEP folder for review/update of ongoing HEP. She f/u'd with PCP last week who was unable to elicit any reflexes below her knees and wants her to see neurologist at [facility] again and proceed with EMG/NCV.    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  No/denies                       [facility] Adult PT Treatment/Exercise - 11/23/19 1402      Knee/Hip Exercises: Aerobic   Recumbent Bike  L2 x 6 min      Ankle Exercises: Seated   Heel Raises  Both;10 reps;3 seconds    Heel Raises Limitations  + eversion isometric into yellow/red TB - pt demonstrating tendency to ER at hips      Ankle Exercises: Standing   Heel Raises  Both;10 reps;3  seconds    Heel Raises Limitations  B concentric with alt weight shift for eccentric lowering (unable to fully shift weight into SLS while still on toes)          Balance Exercises - 11/23/19 1402      Balance Exercises: Standing   Wall Bumps  Hip;Shoulder;Eyes opened;5 reps    Wall Bumps-Hips  Anterior/posterior;Right/left (lateral);5 reps          PT Short Term Goals - 06/01/19 1358      PT SHORT TERM GOAL #1   Title  Independent with initial HEP    Status  Achieved   06/01/19       PT Long Term Goals - 11/16/19 1445      PT LONG TERM GOAL #1   Title  Independent with advanced/ongoing HEP    Status  Partially Met      PT LONG TERM GOAL #2   Title  B hip strength >/= 4/5 and B ankle strength >/= 3+/5 to 4-/5 for improved gait stability    Status  Partially Met   11/16/19 - met for hips, met for ankles except L inversion     PT LONG TERM GOAL #3   Title  Berg >/= 52/56 to reduce risk for falls    Status  Achieved   08/31/19     PT LONG TERM GOAL #4   Title  FGA >/= 25/30 to improve gait safety and stability    Status  On-going   10/19/19 - initial goal of 23/30 met, goal revised to 25/30     PT LONG TERM GOAL #5   Title  Pt will ambulate with normal gait pattern w/o evidence of B foot drop/slap    Status  Partially Met   11/16/19 - no foot drop/slap on R but remains intermittent on L           Plan - 11/23/19 1407    Clinical Impression Statement  [name] Trujillo reports she saw her PCP who noted worsening of her reflex testing and wants her to proceed with seeing the neurologist at [facility] as well as the EMG/NCV. She also reports she mentioned the potential vestibular issues to the PCP, but he felt that since she was not experiencing "spinning" dizziness, that he did not feel her problems were vertigo or inner ear related. PT explained that while she may not be experiencing BPPV type vertigo (spinning), the symptoms she experiences with positional changes and walking may  still indicate a vestibular deficit, and recommended that she mention it to the neurologist and/or f/u with her PCP if issues persist. Treatment session focusing on review of HEP addressing patient's questions regarding the exercises she performs most commonly and adjusting exercises to current functional level. [name] expressing desire to potentially defer her final visit with PT until she is able to meet with the neurologist in the event that a recert is indicated based on findings from the neurology assessment and testing - patient informed of ability to place her on hold for up to 30 days and hence patient will call to rescheduled final appointment as needed once neurology appointment set.    Personal Factors and Comorbidities  Time since onset of injury/illness/exacerbation;Past/Current Experience;Comorbidity 3+    Comorbidities  neuropathy; hypthyroidism; DDD; remote h/o back surgery at age 22; h/o scoliosis    Examination-Activity Limitations  Bend;Locomotion Level;Squat;Stand    Examination-Participation Restrictions  Yard Work;Community Activity;Laundry;Cleaning;Driving    Rehab Potential  Good    PT Frequency  1x / week    PT Duration  4 weeks    PT Treatment/Interventions  ADLs/Self Care Home Management;Electrical Stimulation;Iontophoresis '4mg'$ /ml Dexamethasone;Moist Heat;Ultrasound;Gait training;Stair training;Functional mobility training;Therapeutic activities;Therapeutic exercise;Balance training;Neuromuscular re-education;Patient/family education;Orthotic Fit/Training;Manual techniques;Passive range of motion;Dry needling;Taping;Joint Manipulations    PT Next Visit Plan  Discharge assessment vs recert pending neurology consult    PT Home Exercise Plan  05/24/19 - gastroc/soleus/peroneal stretches, seated heel-toe raises; 05/27/19 - modified peroneal stretch, 4-way ankle with yellow TB, sidelying ankle inversion/eversion; 06/01/19 - toe & heel raise progression to standing with SLS eccentric  lowering as tolerated, SLS, tandem stance & SLS clocks with intermittent UE support; 06/08/19 - posterior & lateral hip bumps, fwd wall falls; 06/17/19 - progression of standing heel raise, side stepping with looped yellow TB at midfoot, Airex pad balance; 06/22/19 - corner balance progression with Airex pad; 06/29/19 - standing 4-way SLR with red TB; 07/06/19 - quadruped hip extension & ER; 07/19/19 - supported lunge; 07/27/19 - foot instrinic strengthening; 08/03/19 - anterior tibialis stretching; 08/10/19 - ankle inversion isometrics  Consulted and Agree with Plan of Care  Patient       Patient will benefit from skilled therapeutic intervention in order to improve the following deficits and impairments:  Abnormal gait, Decreased activity tolerance, Decreased balance, Decreased coordination, Decreased endurance, Decreased knowledge of use of DME, Decreased mobility, Decreased range of motion, Decreased safety awareness, Decreased strength, Difficulty walking, Increased edema, Increased fascial restricitons, Increased muscle spasms, Impaired perceived functional ability, Impaired flexibility, Pain  Visit Diagnosis: Other abnormalities of gait and mobility  Foot drop, left  Foot drop, right  Unsteadiness on feet  Muscle weakness (generalized)     Problem List Patient Active Problem List   Diagnosis Date Noted  . Ventricular premature beats 10/31/2015  . Heart murmur 10/31/2015    Percival Spanish, PT, MPT 11/23/2019, 7:50 PM  [facility] [address]  [place], [place], [address] Phone: [phone]   Fax:  [phone]  Name: Arlicia Paquette MRN: [id number] Date of Birth: 03/20/1941   PHYSICAL THERAPY DISCHARGE SUMMARY  Visits from Start of Care: 21  Current functional level related to goals / functional outcomes:   Refer to above clinical impression for status as of last visit on 11/23/2019. Patient canceled her  final scheduled appointment and opted not to reschedule, therefore unable to complete formal discharge assessment but will proceed with discharge from PT for this episode.   Remaining deficits:   As above. Unable to formally assess status at discharge as patient failed to return for final visit.    Education / Equipment:   HEP  Plan: Patient agrees to discharge.  Patient goals were partially met. Patient is being discharged due to being pleased with the current functional level.  ?????     Percival Spanish, PT, MPT 01/19/20, 8:52 AM  [facility] [place], [place], [address] Phone: [phone]   Fax:  [phone]

## 2019-11-30 ENCOUNTER — Ambulatory Visit: Payer: Medicare PPO | Admitting: Physical Therapy

## 2020-04-04 NOTE — Progress Notes (Signed)
[facility]    Provider:  Dr Jaynee Eagles Referring Provider: Derinda Late, MD Primary Care Physician:  Derinda Late, MD  CC:  Neuropathy  Interval history April 04, 2020: This is a 79 year old female who I saw in March 2018 at the request of Dr. Sandi Mariscal for weakness of the lower legs and ataxia.  Past medical history right lumbar L2 radiculopathy, peripheral neuropathy with paresthesias below knee of the knees, left foot drop, ataxia, restless leg, COPD, significant arthritis of the feet and ankles with bilateral flat feet, hypothyroidism, COPD, systolic heart murmur, osteopenia, hypercholesterolemia.  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  Per Dr. Deboraha Sprang notes which I reviewed, the patient recently had a nerve conduction and EMG at [facility] which also showed polyneuropathy of both legs with superimposed bilateral L5-S1 chronic radiculopathies.  She is scheduled to follow-up with spine surgical consultation with Dr. Marvel Plan in [place] in early June per Dr. Sandi Mariscal, the paresthesias of her lower legs and feet are severe to the point that she is having trouble driving with loss of sensation, she also has associated sensory ataxia, per Dr. Deboraha Sprang notes some of the paresthesias have been burning at night she has some interested in trying medication at least at bedtime, she also has other pain complaints such as over the right posterior hip and upper thigh and may be related to radicular symptoms, if he does not consider her to be a surgical candidate she does not improve on pregabalin his note stated he would refer her back to me for neurologic consultation (so I assume she is not a surgical candidate).  Dr. Sandi Mariscal also goes to say that she has had physical therapy for more than a couple months for gait training and leg strengthening and does not feel like she would get  any additional benefit from doing that again.  Aside from the EMG nerve conduction study we did an extensive serum neuropathy panel including pan ANCA, B1, Lyme, IFE with SPEP, B6, heavy metals, rheumatoid factor, Sjogren's, ANA with reflex, sedimentation rate which did not show any etiology for her distal polyneuropathy.  Symptoms have worsened, she is here alone. She continue to have shooting pain down the legs, she went to PT and stopped crossing legs and the peroneal neuropathy/foot drop improved. Her balance improved with exercises. She saw ortho who did not suggest anything for her flat feet, she is trying to keep up with balance exercises and walk, she walks at least a mile. In March she was trying to back up and didn't know where her foot was and was trying to feel the brake which she couldn't and it scared hr and she has not been driving. Numbness in both her feet, the feet are completely numb and feels like she is walking on an inch of foam. She can't feet heat on her feet. Slowly progressive. She feels her hands are getting involved and in her shoulder and to the little finger and thumb. This new. Symptoms started in 2007.   Patient complains of symptoms per HPI as well as the following symptoms: Swelling in legs, numbness, weakness, cramps, aching muscles, spinning sensation, thyroid disease. Pertinent negatives and positives per HPI. All others negative  HPI 11/28/2016:  [name] Trujillo is a 79 year old female here as a referral from Dr. Sandi Mariscal for ataxia and weakness of the lower legs. Past medical history right lumbar L2 radiculopathy, peripheral  neuropathy with paresthesias below the knees, left foot drop, ataxia, restless leg syndrome, urge incontinence, COPD, one seizure in 2005, significant arthritis of the feet and ankles with bilateral flat feet, hypothyroidism, COPD, systolic heart murmur, osteopenia, hypercholesterolemia. She has pain on the lateral side of her lower legs. Numbness or  squeezing. She feels like she has something very tight on her legs. Her balance is worsening. She is constantly catching herself. She has had peripheral neuropathy for many years but the sensory changes on the lateral lower legs is new. She also has new weakness in the distal foot. Left foot is slapping when she walks. No new back pain. The pain starts at the knee. She had L2 radiculopathy and is feeling well. No weight loss recently. She denies crossing her legs. No boots or compressive etiology around the knee. She has had neuropathy for years with tingling in hte toes like her toes are asleep and she has some foot numbness which is chronic for 10 years or so and not worsening. The lateral lower leg symptoms started 6 months ago, the symptoms have progressed and recently she noticed the foot drop on the left. No inciting events, no falls. No knee swelling.    Reviewed notes, labs and imaging from outside physicians, which showed:  Reviewed primary care notes. Patient has had peripheral neuropathy since 2007. She has ataxia and paresthesias and weakness of the lower legs. She has had a negative evaluation for impaired glucose tolerance and B12 deficiency. She does have hypothyroidism that it has been well controlled. She has chronic paresthesias in the lower legs and feet. She also has had significant arthritis of the feet and ankles for 7 years which is getting worse. She has noticed increasing symptoms of ataxia for the last year. She exhibited ataxia on exam. Unclear whether this was secondary to her orthopedic problems were that she has a neurologic cause. She does not have any dorsal or plantar flexion weakness but reported recently that she does have left plantar flexion weakness now. She has decreased sensation to pinprick below the knees bilaterally. She had an MRI of the brain scan on 11/22/2016 which was negative except for small vessel disease. She had a lumbar radiculopathy in 2016 with a disc  extrusion on the right at L1-L2. This improved with an epidural injection.     Review of Systems: Patient complains of symptoms per HPI as well as the following symptoms: numbness, walking difficulty, no CP, no SOB. Pertinent negatives per HPI. All others negative.   Social History   Socioeconomic History  . Marital status: Married    Spouse name: Not on file  . Number of children: 7  . Years of education: 63  . Highest education level: Bachelor's degree (e.g., BA, AB, BS)  Occupational History  . Occupation: Retired  Tobacco Use  . Smoking status: Never Smoker  . Smokeless tobacco: Never Used  Substance and Sexual Activity  . Alcohol use: Yes    Comment: once or twice a month  . Drug use: No  . Sexual activity: Not on file  Other Topics Concern  . Not on file  Social History Narrative   Lives at home w/ her husband    Right-handed   Caffeine: 2 cups of coffee in the morning   Social Determinants of Health   Financial Resource Strain:   . Difficulty of Paying Living Expenses:   Food Insecurity:   . Worried About Charity fundraiser in the Last Year:   .  Ran Out of Food in the Last Year:   Transportation Needs:   . Film/video editor (Medical):   Marland Kitchen Lack of Transportation (Non-Medical):   Physical Activity:   . Days of Exercise per Week:   . Minutes of Exercise per Session:   Stress:   . Feeling of Stress :   Social Connections:   . Frequency of Communication with Friends and Family:   . Frequency of Social Gatherings with Friends and Family:   . Attends Religious Services:   . Active Member of Clubs or Organizations:   . Attends Archivist Meetings:   Marland Kitchen Marital Status:   Intimate Partner Violence:   . Fear of Current or Ex-Partner:   . Emotionally Abused:   Marland Kitchen Physically Abused:   . Sexually Abused:     Family History  Problem Relation Age of Onset  . Stroke Mother   . Heart attack Father   . Alcohol abuse Father   . Neuropathy Neg Hx      Past Medical History:  Diagnosis Date  . High cholesterol   . Hypothyroidism   . Neuropathy     Past Surgical History:  Procedure Laterality Date  . APPENDECTOMY     6-20 yo  . BACK SURGERY     Age 31  . CESAREAN SECTION    . ECTOPIC PREGNANCY SURGERY    . LAPAROSCOPIC HYSTERECTOMY      Current Outpatient Medications  Medication Sig Dispense Refill  . B Complex-C (SUPER B COMPLEX PO) Take by mouth daily.    Marland Kitchen levothyroxine (SYNTHROID, LEVOTHROID) 100 MCG tablet Take 100 mcg by mouth daily before breakfast.     . rosuvastatin (CRESTOR) 10 MG tablet rosuvastatin 10 mg tablet  TAKE ONE TABLET BY MOUTH EVERY NIGHT AFTER MEAL(S)    . solifenacin (VESICARE) 5 MG tablet Take 5 mg by mouth daily.     No current facility-administered medications for this visit.    Allergies as of 04/05/2020 - Review Complete 04/05/2020  Allergen Reaction Noted  . Oxycodone hcl Nausea And Vomiting 04/05/2020  . Primidone  04/05/2020  . Propranolol  04/05/2020  . Tolterodine  04/05/2020  . Hydrocodone Nausea And Vomiting 04/28/2015    Vitals: There were no vitals taken for this visit. Last Weight:  Wt Readings from Last 1 Encounters:  08/03/18 128 lb (58.1 kg)   Last Height:   Ht Readings from Last 1 Encounters:  08/03/18 '5\' 2"'$  (1.575 m)    Physical exam: Exam: Gen: NAD, conversant, well nourised, well groomed                     CV: RRR, no MRG. No Carotid Bruits. No peripheral edema, warm, nontender Eyes: Conjunctivae clear without exudates or hemorrhage  Neuro: Detailed Neurologic Exam  Speech:    Speech is normal; fluent and spontaneous with normal comprehension.  Cognition:    The patient is oriented to person, place, and time;     recent and remote memory intact;     language fluent;     normal attention, concentration,     fund of knowledge Cranial Nerves:    The pupils are equal, round, and reactive to light. Attempted fundoscopy could not visualize.  Visual  fields are full to finger confrontation. Extraocular movements are intact. Trigeminal sensation is intact and the muscles of mastication are normal. The face is symmetric. The palate elevates in the midline. Hearing intact. Voice is normal. Shoulder shrug is normal.  The tongue has normal motion without fasciculations.   Coordination:    No dysmetria or ataxia  Gait:    Steppage gait  Motor Observation:    No asymmetry, no atrophy, and no involuntary movements noted. Tone:    Normal muscle tone.    Posture:    Slightly stooped      Strength: Patient has mild weakness in the right hip flexion muscle, she does have bilateral weakness of dorsiflexion in the feet left 3+ out of 5 greater than right 4-5, 5- out of 5 left leg flexion and right hip flexion otherwise intact.      Sensation: Patient has intact pinprick and denies any difference between pinprick distally and above the knee but she has decreased temperature distally in a gradient fashion to below the knees, there is a relative numbness of the lateral versus medial lower legs (lateral with less sensation), absent proprioception and vibration at the great toes overall progressed from last examination.     Reflex Exam:  DTR's:    Deep tendon reflexes in the upper and lower extremities are absent in the lowers and 1+ biceps    Toes:    The toes are downgoing bilaterally.   Clonus:    Clonus is absent.       Assessment/Plan:   This is a lovely 79 y.o. female here as a referral from Dr. Sandi Mariscal for peripheral polyneuropathy. 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  Exam does show worsening distal sensory changes, now with  absent reflexes in the lowers, and absent proprioception and vibration which is new since last examination, progressive.   The severe pain in her feet appear to be multifactorial including distal axonal sensorimotor neuropathy with concomitant chronic L4-S1 radiculopathies.  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  I reviewed nerve conduction studies report and data from [facility] which was consistent and also showed polyneuropathy of both legs with superimposed bilateral L4-S1 chronic radiculopathies.  Aside from the EMG nerve conduction study we did serum neuropathy panel including pan ANCA, B1, Lyme, IFE with SPEP, B6, heavy metals, rheumatoid factor, Sjogren's, ANA with reflex, sedimentation rate which did not show any etiology for her severe distal polyneuropathy.  Dr. Sandi Mariscal evaluated her for B12 deficiency and diabetes which was by report negative.  IDIOPATHIC POLYNEUROPATHY: unknown etiology. We can repeat blood work today. At this point we can perform genetic testing. Very unlikely but we will check MRI of the brain and cervical spine for etiologies such as demyelination. Also LP to evaluate for inflammatory neuropathies.  emg/ncs on upper extremities due to new symptoms - start with right arm and if negative do not have to perform on the left. If there are findings on the right please compare to the left.   Genetic panel for 74 peripheral neuropathy genetic conditions then rebound to CMT panel if negative  Lumbar puncture for cell-protein dissociation/inflammatory or infectious causes  Orders Placed This Encounter  Procedures  . MR BRAIN W WO CONTRAST  . MR CERVICAL SPINE W WO CONTRAST  . DG FLUORO GUIDED LOC OF NEEDLE/CATH TIP FOR SPINAL INJECT LT  . Hemoglobin A1c  . B12 and Folate  Panel  . Methylmalonic acid, serum  . Vitamin B1  .  Vitamin B6  . TSH  . Sedimentation rate  . Sjogren's syndrome antibods(ssa + ssb)  . Pan-ANCA  . RPR  . Hepatitis C antibody  . Tissue transglutaminase, IgA  . Gliadin antibodies, serum  . Rheumatoid factor  . Heavy metals, blood  . Multiple Myeloma Panel (SPEP&IFE w/QIG)  . ANA, IFA (with reflex)  . Basic Metabolic Panel  . NCV with EMG(electromyography)   I spent 100 minutes of face-to-face and non-face-to-face time with patient on the  1. Idiopathic peripheral neuropathy   2. Numbness of lower limb   3. Focal sensory loss   4. Leg weakness, bilateral   5. Bilateral foot-drop   6. 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 Numbness and tingling in right hand   9. Idiopathic progressive neuropathy    diagnosis.  This included previsit chart review, lab review, study review, order entry, electronic health record documentation, patient education on the different diagnostic and therapeutic options, counseling and coordination of care, risks and benefits of management, compliance, or risk factor reduction  Cc: Dr. Glenna Durand, [facility] [address] [place], [place] [address]  Phone [phone] Fax [phone]

## 2020-04-05 ENCOUNTER — Ambulatory Visit: Payer: Medicare PPO | Admitting: Neurology

## 2020-04-05 ENCOUNTER — Other Ambulatory Visit: Payer: Self-pay

## 2020-04-05 ENCOUNTER — Encounter: Payer: Self-pay | Admitting: Neurology

## 2020-04-05 DIAGNOSIS — G603 Idiopathic progressive neuropathy: Secondary | ICD-10-CM

## 2020-04-05 DIAGNOSIS — R269 Unspecified abnormalities of gait and mobility: Secondary | ICD-10-CM

## 2020-04-05 DIAGNOSIS — R200 Anesthesia of skin: Secondary | ICD-10-CM | POA: Diagnosis not present

## 2020-04-05 DIAGNOSIS — R202 Paresthesia of skin: Secondary | ICD-10-CM

## 2020-04-05 DIAGNOSIS — M21372 Foot drop, left foot: Secondary | ICD-10-CM

## 2020-04-05 DIAGNOSIS — R449 Unspecified symptoms and signs involving general sensations and perceptions: Secondary | ICD-10-CM

## 2020-04-05 DIAGNOSIS — R29898 Other symptoms and signs involving the musculoskeletal system: Secondary | ICD-10-CM | POA: Diagnosis not present

## 2020-04-05 DIAGNOSIS — M21371 Foot drop, right foot: Secondary | ICD-10-CM

## 2020-04-05 DIAGNOSIS — G1220 Motor neuron disease, unspecified: Secondary | ICD-10-CM

## 2020-04-05 DIAGNOSIS — G609 Hereditary and idiopathic neuropathy, unspecified: Secondary | ICD-10-CM | POA: Diagnosis not present

## 2020-04-05 NOTE — Patient Instructions (Addendum)
Very unlikely but we will check MRI of the brain and cervical spine for etiologies such as demyelination.   Repeat emg/ncs on upper extremities due to new symptoms - start with right arm and if negative do not have to perform on the left. If there are findings on the right please compare to the left.   Genetic panel for 74 peripheral neuropathy genetic conditions then rebound to CMT(Charcot H[facility]ias Tooth) panel if negative  Lumbar puncture for cell-protein dissociation to look for breakdown in the fluid and protein. If we find this we can discuss a trial of IVIG treatment for possible inflammatory neuropathies  Peripheral Neuropathy Peripheral neuropathy is a type of nerve damage. It affects nerves that carry signals between the spinal cord and the arms, legs, and the rest of the body (peripheral nerves). It does not affect nerves in the spinal cord or brain. In peripheral neuropathy, one nerve or a group of nerves may be damaged. Peripheral neuropathy is a broad category that includes many specific nerve disorders, like diabetic neuropathy, hereditary neuropathy, and carpal tunnel syndrome. What are the causes? This condition may be caused by:  Diabetes. This is the most common cause of peripheral neuropathy.  Nerve injury.  Pressure or stress on a nerve that lasts a long time.  Lack (deficiency) of B vitamins. This can result from alcoholism, poor diet, or a restricted diet.  Infections.  Autoimmune diseases, such as rheumatoid arthritis and systemic lupus erythematosus.  Nerve diseases that are passed from parent to child (inherited).  Some medicines, such as cancer medicines (chemotherapy).  Poisonous (toxic) substances, such as lead and mercury.  Too little blood flowing to the legs.  Kidney disease.  Thyroid disease. In some cases, the cause of this condition is not known. What are the signs or symptoms? Symptoms of this condition depend on which of your nerves is damaged.  Common symptoms include:  Loss of feeling (numbness) in the feet, hands, or both.  Tingling in the feet, hands, or both.  Burning pain.  Very sensitive skin.  Weakness.  Not being able to move a part of the body (paralysis).  Muscle twitching.  Clumsiness or poor coordination.  Loss of balance.  Not being able to control your bladder.  Feeling dizzy.  Sexual problems. How is this diagnosed? Diagnosing and finding the cause of peripheral neuropathy can be difficult. Your health care provider will take your medical history and do a physical exam. A neurological exam will also be done. This involves checking things that are affected by your brain, spinal cord, and nerves (nervous system). For example, your health care provider will check your reflexes, how you move, and what you can feel. You may have other tests, such as:  Blood tests.  Electromyogram (EMG) and nerve conduction tests. These tests check nerve function and how well the nerves are controlling the muscles.  Imaging tests, such as CT scans or MRI to rule out other causes of your symptoms.  Removing a small piece of nerve to be examined in a lab (nerve biopsy). This is rare.  Removing and examining a small amount of the fluid that surrounds the brain and spinal cord (lumbar puncture). This is rare. How is this treated? Treatment for this condition may involve:  Treating the underlying cause of the neuropathy, such as diabetes, kidney disease, or vitamin deficiencies.  Stopping medicines that can cause neuropathy, such as chemotherapy.  Medicine to relieve pain. Medicines may include: ? Prescription or over-the-counter pain medicine. ?  Antiseizure medicine. ? Antidepressants. ? Pain-relieving patches that are applied to painful areas of skin.  Surgery to relieve pressure on a nerve or to destroy a nerve that is causing pain.  Physical therapy to help improve movement and balance.  Devices to help you  move around (assistive devices). Follow these instructions at home: Medicines  Take over-the-counter and prescription medicines only as told by your health care provider. Do not take any other medicines without first asking your health care provider.  Do not drive or use heavy machinery while taking prescription pain medicine. Lifestyle   Do not use any products that contain nicotine or tobacco, such as cigarettes and e-cigarettes. Smoking keeps blood from reaching damaged nerves. If you need help quitting, ask your health care provider.  Avoid or limit alcohol. Too much alcohol can cause a vitamin B deficiency, and vitamin B is needed for healthy nerves.  Eat a healthy diet. This includes: ? Eating foods that are high in fiber, such as fresh fruits and vegetables, whole grains, and beans. ? Limiting foods that are high in fat and processed sugars, such as fried or sweet foods. General instructions   If you have diabetes, work closely with your health care provider to keep your blood sugar under control.  If you have numbness in your feet: ? Check every day for signs of injury or infection. Watch for redness, warmth, and swelling. ? Wear padded socks and comfortable shoes. These help protect your feet.  Develop a good support system. Living with peripheral neuropathy can be stressful. Consider talking with a mental health specialist or joining a support group.  Use assistive devices and attend physical therapy as told by your health care provider. This may include using a walker or a cane.  Keep all follow-up visits as told by your health care provider. This is important. Contact a health care provider if:  You have new signs or symptoms of peripheral neuropathy.  You are struggling emotionally from dealing with peripheral neuropathy.  Your pain is not well-controlled. Get help right away if:  You have an injury or infection that is not healing normally.  You develop new  weakness in an arm or leg.  You fall frequently. Summary  Peripheral neuropathy is when the nerves in the arms, or legs are damaged, resulting in numbness, weakness, or pain.  There are many causes of peripheral neuropathy, including diabetes, pinched nerves, vitamin deficiencies, autoimmune disease, and hereditary conditions.  Diagnosing and finding the cause of peripheral neuropathy can be difficult. Your health care provider will take your medical history, do a physical exam, and do tests, including blood tests and nerve function tests.  Treatment involves treating the underlying cause of the neuropathy and taking medicines to help control pain. Physical therapy and assistive devices may also help. This information is not intended to replace advice given to you by your health care provider. Make sure you discuss any questions you have with your health care provider. Document Revised: 08/08/2017 Document Reviewed: 11/04/2016 Elsevier Patient Education  2020 [employer].  Commonly used medications associated with polyneuropathy include:  - Linezolid which can cause a sensory neuropathy and possibly an optic neuropathy - Chloramphenicol which can cause a motor predominant axonal neuropathy - Metronidazole which can cause a sensory predominant axonal neuropathy small fiber neuropathy and may also cause optic neuropathy, cerebellar ataxia and encephalopathy - Nitrofurantoin can cause a sensory motor axonal neuropathy or motor predominant neuropathy or sensory neuronopathy. Isoniazid can cause a sensory  motor axonal neuropathy and administration of pyridoxine prevents neuropathy development - Reverse transcript days inhibitors (zalcitabine, didanosine, stavudine) can cause sensory neuropathy and myopathy - Amiodarone can cause a mixed axonal and demyelinating sensorimotor neuropathy - Phenytoin can cause an axonal sensorimotor neuropathy - Pyridoxine or vitamin B6 can cause a sensory  neuropathy or neuronopathy - Colchicine can cause myopathy and a sensorimotor neuropathy - vinca, alkaloid (vincristine, vinblastine, vinorelbine) can cause a sensorimotor, rarely pure motor, cranial or automatic axonal neuropathy. Vincristine is the most neurotoxic - Taxane (paclitaxel, docetaxel and carbazitaxel) can cause a sensory neuropathy, sensorimotor peripheral nerve hyperexcitability axonal Protease inhibitors such as bortezomib can cause a sensory, sensorimotor or radiculoneuropathy of the axonal type rarely demyelinating and nerve biopsy may show vasculitis          - - Thalidomide can cause a sensory or sensorimotor or autonomic axonal neuropathy - Suramin and can cause a sensory or sensorimotor axonal with rarely demyelinating.

## 2020-04-06 ENCOUNTER — Encounter: Payer: Self-pay | Admitting: Neurology

## 2020-04-06 ENCOUNTER — Telehealth: Payer: Self-pay | Admitting: *Deleted

## 2020-04-06 DIAGNOSIS — G603 Idiopathic progressive neuropathy: Secondary | ICD-10-CM | POA: Insufficient documentation

## 2020-04-06 NOTE — Telephone Encounter (Signed)
Per v.o. [name], [facility] neuropathy genetic testing order form completed and is pending MD signature.

## 2020-04-06 NOTE — Telephone Encounter (Signed)
Genetic testing order form for neuropathy signed by [name] and faxed to [facility]. Received a receipt of confirmation.

## 2020-04-11 ENCOUNTER — Telehealth: Payer: Self-pay | Admitting: Neurology

## 2020-04-11 NOTE — Telephone Encounter (Signed)
[employer] pending uploaded notes,  Lanora Manis the patient daughter called to check on the status of the MRI that was just order on 7/28/1. I called the patient daughter back and left a voicemail informing her that her insurance [employer] requires authorization and that it is pending right now. Once it has been approved she or her mom will get a call to get the MRI's scheduled.

## 2020-04-13 ENCOUNTER — Telehealth: Payer: Self-pay | Admitting: Neurology

## 2020-04-13 LAB — MULTIPLE MYELOMA PANEL, SERUM
Albumin SerPl Elph-Mcnc: 4.2 g/dL (ref 2.9–4.4)
Albumin/Glob SerPl: 1.6 (ref 0.7–1.7)
Alpha 1: 0.2 g/dL (ref 0.0–0.4)
Alpha2 Glob SerPl Elph-Mcnc: 0.7 g/dL (ref 0.4–1.0)
B-Globulin SerPl Elph-Mcnc: 1 g/dL (ref 0.7–1.3)
Gamma Glob SerPl Elph-Mcnc: 0.8 g/dL (ref 0.4–1.8)
Globulin, Total: 2.7 g/dL (ref 2.2–3.9)
IgA/Immunoglobulin A, Serum: 101 mg/dL (ref 64–422)
IgG (Immunoglobin G), Serum: 906 mg/dL (ref 586–1602)
IgM (Immunoglobulin M), Srm: 37 mg/dL (ref 26–217)
Total Protein: 6.9 g/dL (ref 6.0–8.5)

## 2020-04-13 LAB — BASIC METABOLIC PANEL
BUN/Creatinine Ratio: 17 (ref 12–28)
BUN: 13 mg/dL (ref 8–27)
CO2: 22 mmol/L (ref 20–29)
Calcium: 9.8 mg/dL (ref 8.7–10.3)
Chloride: 104 mmol/L (ref 96–106)
Creatinine, Ser: 0.77 mg/dL (ref 0.57–1.00)
GFR calc Af Amer: 86 mL/min/{1.73_m2} (ref >59)
GFR calc non Af Amer: 75 mL/min/{1.73_m2} (ref >59)
Glucose: 98 mg/dL (ref 65–99)
Potassium: 4.2 mmol/L (ref 3.5–5.2)
Sodium: 141 mmol/L (ref 134–144)

## 2020-04-13 LAB — METHYLMALONIC ACID, SERUM: Methylmalonic Acid: 147 nmol/L (ref 0–378)

## 2020-04-13 LAB — SJOGREN'S SYNDROME ANTIBODS(SSA + SSB)
ENA SSA (RO) Ab: <0.2 AI (ref 0.0–0.9)
ENA SSB (LA) Ab: <0.2 AI (ref 0.0–0.9)

## 2020-04-13 LAB — VITAMIN B1: Thiamine: 177.8 nmol/L (ref 66.5–200.0)

## 2020-04-13 LAB — TISSUE TRANSGLUTAMINASE, IGA: Transglutaminase IgA: <2 U/mL (ref 0–3)

## 2020-04-13 LAB — PAN-ANCA
ANCA Proteinase 3: <3.5 U/mL (ref 0.0–3.5)
Atypical pANCA: <1:20 {titer}
C-ANCA: <1:20 {titer}
Myeloperoxidase Ab: <9 U/mL (ref 0.0–9.0)
P-ANCA: <1:20 {titer}

## 2020-04-13 LAB — RPR: RPR Ser Ql: NONREACTIVE

## 2020-04-13 LAB — HEMOGLOBIN A1C
Est. average glucose Bld gHb Est-mCnc: 114 mg/dL
Hgb A1c MFr Bld: 5.6 % (ref 4.8–5.6)

## 2020-04-13 LAB — B12 AND FOLATE PANEL
Folate: >20 ng/mL (ref >3.0)
Vitamin B-12: 541 pg/mL (ref 232–1245)

## 2020-04-13 LAB — HEAVY METALS, BLOOD
Arsenic: 5 ug/L (ref 2–23)
Lead, Blood: 1 ug/dL (ref 0–4)
Mercury: <1 ug/L (ref 0.0–14.9)

## 2020-04-13 LAB — GLIADIN ANTIBODIES, SERUM
Antigliadin Abs, IgA: 3 units (ref 0–19)
Gliadin IgG: 2 units (ref 0–19)

## 2020-04-13 LAB — VITAMIN B6: Vitamin B6: 7.7 ug/L (ref 2.0–32.8)

## 2020-04-13 LAB — RHEUMATOID FACTOR: Rheumatoid fact SerPl-aCnc: <10 IU/mL (ref 0.0–13.9)

## 2020-04-13 LAB — ANTINUCLEAR ANTIBODIES, IFA: ANA Titer 1: NEGATIVE

## 2020-04-13 LAB — SEDIMENTATION RATE: Sed Rate: 4 mm/hr (ref 0–40)

## 2020-04-13 LAB — TSH: TSH: 0.275 u[IU]/mL — ABNORMAL LOW (ref 0.450–4.500)

## 2020-04-13 LAB — HEPATITIS C ANTIBODY: Hep C Virus Ab: 0.1 s/co ratio (ref 0.0–0.9)

## 2020-04-13 NOTE — Telephone Encounter (Signed)
Called and spoke to the daughter and she relayed [name] Trujillo [phone] and she wants MRI orders and LP Orders to go to [facility] Daughter gave me telephone number.  Telephone [phone] Fax [phone]  I called [facility] Center and They relayed they would obtain authorization .  [name] had already started MRI authorization  for GI before daughter wanted change in places . MRI status is pending. We probably need to wait for our MRI status to come back first to  if approved that way [name] can just change status.  Daughter is not planning on having MRI and LP until the 3 rd week in August [name] this is not urgent.    [name] and I will fax LP and MRI orders at the same time will give order to [facility] for [name] to sign when she gets back.

## 2020-04-13 NOTE — Telephone Encounter (Signed)
Bethany you are correct . Patient will have to come to the office to have here for insurance reason's . Or Daughter can get PCP order and fax [name] the results.

## 2020-04-13 NOTE — Telephone Encounter (Signed)
Pt would like to speak to provider or RN about her NCV/EMG. She would like to know if the order can be sent out for her so that she can get it done somewhere near wear her daughter lives in [place]. Please advise.

## 2020-04-13 NOTE — Telephone Encounter (Signed)
Noted  

## 2020-04-13 NOTE — Telephone Encounter (Signed)
Spoke with pt. Will leave EMG appt as scheduled for now but she is aware if she wants that one done specifically in [place] then Dr. Duaine Dredge will need to order it. Pt was mostly concerned about the MRI and LP. I spoke with [name] Trujillo, we can send to a place of her choice. Pt would like [place] to call her daughter [name] Trujillo who can provide details of imaging location and help facilitate. Her number is [phone].

## 2020-04-17 NOTE — Telephone Encounter (Signed)
I called to check the status it is MD review.

## 2020-04-19 ENCOUNTER — Telehealth: Payer: Self-pay | Admitting: *Deleted

## 2020-04-19 NOTE — Telephone Encounter (Signed)
-----   Message from Anson Fret, MD sent at 04/17/2020  8:29 AM EDT ----- Labs all look fine, Her thyroid is slightly decreased but this is consistent with her thyroid disease and treatment and she follows with Dr. Duaine Dredge for this. Othrewise no reason for her neuropathy in the feet thanks

## 2020-04-19 NOTE — Telephone Encounter (Signed)
[employer] auth: [id number] (exp. 04/11/20 to 05/11/20) for CPT codes 70553 & 72156.   Faxed order to [facility] fax # [phone] & ph # [phone]. 

## 2020-04-19 NOTE — Telephone Encounter (Signed)
Francine Gra[facility]kley Harvey: [id number] (exp. 04/11/20 to 03-Jun-2020) for CPT codes 61901 & 22241.   Faxed order to [facility] fax # [phone] & ph # [phone].

## 2020-04-19 NOTE — Telephone Encounter (Signed)
I called the pt and LVM asking for call back. When she calls back, you can let her know that Dr Lucia Gaskins says her labs all look fine. Her thyroid is slightly decreased but this is consistent with her thyroid and treatment that she sees Dr Duaine Dredge for. Otherwise Dr Lucia Gaskins did not find anything in the labs that shows a reason for her neuropathy in her feet.   If she has any questions please let me know, thanks.

## 2020-04-25 NOTE — Telephone Encounter (Signed)
I called the pt and discussed her lab results per Dr Lucia Gaskins. Pt reported that she recently saw Dr Duaine Dredge and her thyroid was high so her synthroid dose was decreased to 88 mcg from 100 mcg. I faxed the results to Dr Duaine Dredge for his records. Pt also said she fell recently. She felt it was more from vertigo and not neuropathy. She did see Dr Duaine Dredge after the fall. Pt verbalized appreciation for the call.

## 2020-04-28 ENCOUNTER — Other Ambulatory Visit: Payer: Self-pay

## 2020-04-28 ENCOUNTER — Encounter (INDEPENDENT_AMBULATORY_CARE_PROVIDER_SITE_OTHER): Payer: Self-pay

## 2020-04-28 DIAGNOSIS — M5412 Radiculopathy, cervical region: Secondary | ICD-10-CM

## 2020-04-28 NOTE — Telephone Encounter (Signed)
NPI and Tax ID changed To [facility]  NPI [id number] Tax ID [id number] Address has been updated also .   Called [employer] and spoke to Apprentice 04/28/2020

## 2020-04-29 ENCOUNTER — Ambulatory Visit
Admission: RE | Admit: 2020-04-29 | Discharge: 2020-04-29 | Disposition: A | Discharge status: Home / Self Care | Payer: Medicare PPO | Source: Ambulatory Visit | Attending: Forensic Psychiatry | Admitting: Forensic Psychiatry

## 2020-04-29 ENCOUNTER — Other Ambulatory Visit: Payer: Self-pay

## 2020-04-29 DIAGNOSIS — M4802 Spinal stenosis, cervical region: Secondary | ICD-10-CM | POA: Insufficient documentation

## 2020-04-29 DIAGNOSIS — G609 Hereditary and idiopathic neuropathy, unspecified: Secondary | ICD-10-CM

## 2020-04-29 DIAGNOSIS — M5412 Radiculopathy, cervical region: Secondary | ICD-10-CM

## 2020-04-29 DIAGNOSIS — M4722 Other spondylosis with radiculopathy, cervical region: Secondary | ICD-10-CM | POA: Insufficient documentation

## 2020-04-29 MED ORDER — GADOBUTROL 1 MMOL/ML IV SOLN
5.5000 mL | Freq: Once | INTRAVENOUS | Status: AC | PRN
Start: 2020-04-29 — End: 2020-04-29
  Administered 2020-04-29: 5.5 mmol via INTRAVENOUS
  Filled 2020-04-29: qty 7.5

## 2020-05-01 NOTE — Telephone Encounter (Signed)
Noted, thank you

## 2020-05-04 ENCOUNTER — Encounter: Payer: Medicare PPO | Admitting: Neurology

## 2020-05-07 ENCOUNTER — Other Ambulatory Visit: Payer: Medicare PPO

## 2020-05-08 ENCOUNTER — Telehealth: Payer: Self-pay | Admitting: Neurology

## 2020-05-08 NOTE — Telephone Encounter (Signed)
[name] Trujillo: I reviewed patient's MRI of the cervical spine with and without contrast and MRI of the brain to try to find any etiologies for patient's lower extremity numbness and imbalance.  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  MRI of the brain was normal for age/unremarkable, no etiology found for her symptoms.  And of course we repeated extensive lab testing including hemoglobin A1c, B12 and folate, methylmalonic acid, B1, B6, sed rate, Sjogren's, ANCA, RPR, hep C, celiac's, rheumatoid factor, heavy metals, IFE/F Pap, rheumatologic labs ANA IFA with reflex and no etiology found.  We discussed a genetic panel as well at last appointment(Did he complete it?).  I also ordered a lumbar puncture for her and that does not appear to have been completed.  And not sure I can think of any other thing to try for her.  If no etiology found with these I would recommend Dr. Duaine Dredge send patient back to [facility] neurology where she also had EMG nerve conduction study performed already. thanks

## 2020-05-09 NOTE — Telephone Encounter (Addendum)
I called the pt and discussed the results below from Dr Lucia Gaskins. The patient verbalized understanding. She will reach out to the imaging facility to have them fax over her lumbar puncture results. She also stated she hasn't heard from [facility] so I let her know I would follow-up. The pt understands that if the rest of the workup does not show an etiology for her symptoms, Dr Lucia Gaskins isn't sure there is more she can offer so she would then recommend Dr Duaine Dredge refer her back to [facility] where she has had an EMG before. She verbalized appreciation for the call.   I spoke with [facility] representative. They will call the pt and set her up for specimen collection.  Per Dr Lucia Gaskins, send copy of MRIs to Dr Duaine Dredge and medical records. I called [facility] (Dr Duaine Dredge) to confirm their fax number. Unfortunately their fax machines have not been working since February and they request the results be sent to them via mail. I sent a copy to medical records.

## 2020-05-10 NOTE — Telephone Encounter (Signed)
I called the pt about [facility]. She isn't sure she wants so many "irons in the fire". She hasn't gotten the LP. I may have misunderstood yesterday. The EMG was done however and she will call tomorrow to have those results faxed to our office. The pt will also touch base with [place] tomorrow. She verbalized appreciation for the call.

## 2020-05-10 NOTE — Telephone Encounter (Signed)
Chena @ [facility] has called [name] Trujillo back to inform she reached out to pt re: kit choice but no response from pt yet.  Chena just wanted to give [facility] a heads up.  Please call Chena _0 [phone]

## 2020-05-11 NOTE — Telephone Encounter (Signed)
Noted, thanks!

## 2020-05-18 NOTE — Telephone Encounter (Addendum)
Spoke with Chena and let her know I had spoken with pt last week and let her know [facility] was trying to reach her but pt stated she wasn't sure she wanted so many irons in the fire. They will close out the case for now and when pt is ready she can contact them. They have been leaving pt voicemails every 2 days since about 05/09/20.

## 2020-05-18 NOTE — Telephone Encounter (Signed)
Chena @ [facility] has called Bethany back to inform she reached out to pt multiple times and has been unable to reach her.  [name] Trujillo is asking to be called at [phone]

## 2020-05-18 NOTE — Telephone Encounter (Signed)
Dr Lucia Gaskins aware pt isn't responding to [facility] and we are awaiting her to send results of EMG and get back in touch with [place].

## 2020-05-24 ENCOUNTER — Telehealth (INDEPENDENT_AMBULATORY_CARE_PROVIDER_SITE_OTHER): Payer: Medicare PPO | Admitting: Neurological Surgery

## 2020-05-24 DIAGNOSIS — R200 Anesthesia of skin: Secondary | ICD-10-CM

## 2020-05-24 NOTE — Progress Notes (Signed)
TELEMEDICINE VISIT              CLINICAL SUMMARY        Verbal Consent      Verbal consent has been obtained from the patient to conduct a telephone visit encounter: yes      Chief Complaint:      Hand numbness      Problem List, Medications, and Allergies reviewed: yes      HPI:  Stephanie Castaneda is a 79 year-old female, right handed, presenting today for a 6-9 month history of hand numbness. She notes two years ago she had numbness in both of her feet that progressively worsened, concerned that she will have similar issue in her hands. Does not have feeling in the bottom of her feet, no reflexes in the legs. Does not report any neck pain. Reports dexterity issues, particularly noted with sewing.     Patient currently resides in [place], [place].       Assessment/Plan:    Stephanie Castaneda is a 21 year-old female with bilateral hand numbness. 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 No evidence of peripheral neuropathy. Patient advised to follow-up in person for discussion for possible ACDF C5-6, C6-7.    Time spent in medical discussion: 11 to 20 minutes       All relevant and clinical information was transcribed by me, Rondel Oh, NP-C, acting as a scribe for Dr. [name].

## 2020-05-25 ENCOUNTER — Encounter: Payer: Medicare PPO | Admitting: Neurology

## 2020-06-28 ENCOUNTER — Ambulatory Visit (INDEPENDENT_AMBULATORY_CARE_PROVIDER_SITE_OTHER): Payer: Medicare PPO | Admitting: Neurological Surgery

## 2020-06-28 ENCOUNTER — Encounter (INDEPENDENT_AMBULATORY_CARE_PROVIDER_SITE_OTHER): Payer: Self-pay | Admitting: Neurological Surgery

## 2020-06-28 VITALS — BP 168/81 | HR 91 | Resp 18 | Ht 62.0 in | Wt 122.0 lb

## 2020-06-28 DIAGNOSIS — R200 Anesthesia of skin: Secondary | ICD-10-CM

## 2020-06-28 DIAGNOSIS — M4802 Spinal stenosis, cervical region: Secondary | ICD-10-CM

## 2020-06-28 NOTE — Progress Notes (Signed)
[facility]  Follow up Note    Previous Impression/Plan     Stephanie Castaneda is a 79 year-old female with bilateral hand numbness. 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 No evidence of peripheral neuropathy. Patient advised to follow-up in person for discussion for possible ACDF C5-6, C6-7.    HPI     Chief Complaint   Patient presents with   . Pre-op Exam     Cervical stenosis     Stephanie Castaneda is a 79 year old female following up today with bilateral hand numbness.  Here today to discuss surgical intervention for cervical stenosis.  To reiterate, patient has had a 69-month history of paresthesias in the upper and lower extremities.  Patient is here today with her daughter.  Patient spends a considerable amount of time quilting, concerns about her ability to so after surgery.    Physical Examination   VITAL SIGNS:   height is 1.575 m (5\' 2" ) and weight is 55.3 kg (122 lb). Her blood pressure is 168/81 and her pulse is 91. Her respiration is 18.        Awake, alert, oriented x3, Follows commands  GCS: 15   Speech is clear  Attention span and concentration: intact  Recent and remote memory: intact    Gait Intact  Able to walk on toes and heels    Point tenderness: None     Motor Exam:   R L   Deltoid    5 5  Tricep    5 5  Bicep    4 - 4  Wrist Extension  5 5  Wrest Flexion   5 5  Finger Abduction  4 4  Grip    4+ 4+  ADM    4 4  WE    4 4  WF    4 4    L2-3     Iliopsoas (Hip Flexion)                        5          5  L3-4     Quadriceps (Knee Extension)             5          5  L5-S1   Hamstring (Knee Flexion)                   5          5  L4-5     Tibialis Anterior (Foot Dorsiflexion)    5           4  S1        Gastrocsoleus (Plantar Flexion)         5          4  L5        EHL (Toe Dorsiflexion)                       4           4 -    Rapid alternating  movements intact    Sensation intact throughout     Reflexes:  DTRs 1+ throughout      Clonus: right negative, left negative   Babinski: right negative, left negative   Hoffman's: right  negative,  left negative    Review of Systems   Review of Systems   Neurological: Positive for tingling and weakness.     Radiology Interpretation     MRI CERVICAL SPINE WITHOUT AND WITH CONTRAST    HISTORY: Extremity numbness. Off balance. Neck pain.    COMPARISON: None.    TECHNIQUE: Multiplanar imaging of the cervical spine was performed before  and after the intravenous administration of 5.5cc Gadavist contrast.    FINDINGS: There is a mild broad right convex curvature of the cervical  spine and a mild-moderate left convex curvature of the upper thoracic  spine. There is reversal of the normal cervical lordosis with an associated  grade 1 retrolisthesis of C3 on C4 and grade 1 anterolistheses of C4 on C5  and C7 on T1. Vertebral body heights are within normal limits. There is  multilevel disc space narrowing. The cervical spinal cord signal appears  normal. No abnormal enhancement is seen.    C2-C3: No significant disc bulge/herniation or stenosis.    C3-C4: Mild diffuse disc bulge/osteophyte complex including right greater  than left uncovertebral hypertrophy with left greater than right facet  arthrosis resulting in moderate bilateral neural foraminal stenosis.    C4-C5: Mild diffuse disc bulge/osteophyte complex including right greater  than left uncovertebral hypertrophy with left facet arthrosis resulting in  mild-moderate right and mild left neural foraminal stenosis.    C5-C6: Mild-moderate diffuse disc bulge/osteophyte complex which is more  prominent towards the right including right greater than left uncovertebral  hypertrophy with mild bilateral facet arthrosis resulting in severe right  and mild-moderate left neural foraminal stenosis.    C6-C7: Mild-moderate diffuse disc bulge/osteophyte complex  including  bilateral uncovertebral hypertrophy and mild facet arthrosis resulting in  moderate-severe bilateral neural foraminal stenosis.    C7-T1: Bilateral facet arthrosis. No significant disc bulge/herniation or  stenosis.    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    Electronically signed by: Leandro Reasoner M.D.  [Interpreted at: [id number]]  [facility]    PM: 05/02/20    Impression     Stephanie Castaneda is a 79 year old female with cervical stenosis, most significant at C5-6, with a moderate diffuse disc bulge and osteophyte complex, more prominent to the right, resulting in severe right and moderate left neural foraminal stenosis, as well as moderate diffuse disc bulge and osteophyte complex at C6-7 resulting in moderate to severe bilateral neural foraminal stenosis.  Again, EMG and nerve conduction velocity study show a C6 and C7 radiculopathy.  Given that conservative measures have failed to manage her symptoms, my recommendation would be to proceed with an anterior cervical discectomy and fusion from C5-C7.    Potential benefits and outcomes include prevention of further spinal cord damage, decreased neck and back pain, numbness, tingling, and weakness in your extremities.  Potential risks and complications include, but are not limited to bleeding, infection, cerebrospinal fluid leak, swallowing difficulties, damage to surrounding structures, nerve damage, paralysis, incomplete relief of symptoms, need for further surgery, hardware malposition or failure, failure to fuse, adjunctive segment disease, and risks of anesthesia including coma and death.  Alternatives to the proposed surgery include no surgery and medical management.    Patient verbalized understanding of above-mentioned and would like to proceed with proposed surgical intervention.    IMED consent was signed for C5-7 ACDF.    Patient will require preoperative clearance from her primary care  physician and lab evaluation prior to surgery.  Follow-up   6 weeks after surgery    All relevant and clinical information was transcribed by me, Rondel Oh, NP-C, acting as a scribe for Dr. [name].    Marlaine Hind, MD

## 2020-06-29 ENCOUNTER — Ambulatory Visit: Admission: RE | Admit: 2020-06-29 | Payer: Self-pay | Source: Ambulatory Visit

## 2020-06-29 ENCOUNTER — Encounter (INDEPENDENT_AMBULATORY_CARE_PROVIDER_SITE_OTHER): Payer: Self-pay | Admitting: Neurological Surgery

## 2020-06-29 ENCOUNTER — Other Ambulatory Visit: Payer: Self-pay

## 2020-07-10 ENCOUNTER — Encounter (INDEPENDENT_AMBULATORY_CARE_PROVIDER_SITE_OTHER): Payer: Self-pay

## 2020-07-28 ENCOUNTER — Other Ambulatory Visit: Payer: Self-pay | Admitting: Neurology

## 2020-07-28 ENCOUNTER — Ambulatory Visit
Admission: RE | Admit: 2020-07-28 | Discharge: 2020-07-28 | Disposition: A | Discharge status: Home / Self Care | Payer: Medicare PPO | Source: Ambulatory Visit | Attending: Neurology | Admitting: Neurology

## 2020-07-28 ENCOUNTER — Other Ambulatory Visit (INDEPENDENT_AMBULATORY_CARE_PROVIDER_SITE_OTHER): Payer: Self-pay | Admitting: Neurological Surgery

## 2020-07-28 DIAGNOSIS — R200 Anesthesia of skin: Secondary | ICD-10-CM

## 2020-07-28 DIAGNOSIS — R201 Hypoesthesia of skin: Secondary | ICD-10-CM

## 2020-07-28 DIAGNOSIS — M4802 Spinal stenosis, cervical region: Secondary | ICD-10-CM | POA: Insufficient documentation

## 2020-07-28 DIAGNOSIS — M47816 Spondylosis without myelopathy or radiculopathy, lumbar region: Secondary | ICD-10-CM | POA: Insufficient documentation

## 2020-07-28 DIAGNOSIS — Z9889 Other specified postprocedural states: Secondary | ICD-10-CM | POA: Insufficient documentation

## 2020-07-28 MED ORDER — GADOBUTROL 1 MMOL/ML IV SOLN
6.0000 mL | Freq: Once | INTRAVENOUS | Status: AC | PRN
Start: 2020-07-28 — End: 2020-07-28
  Administered 2020-07-28: 6 mmol via INTRAVENOUS
  Filled 2020-07-28: qty 7.5

## 2020-08-10 ENCOUNTER — Encounter (INDEPENDENT_AMBULATORY_CARE_PROVIDER_SITE_OTHER): Payer: Self-pay | Admitting: Neurological Surgery

## 2020-08-28 ENCOUNTER — Other Ambulatory Visit (INDEPENDENT_AMBULATORY_CARE_PROVIDER_SITE_OTHER): Payer: Self-pay | Admitting: Nurse Practitioner

## 2020-08-28 MED ORDER — MISC. DEVICES KIT
PACK | 0 refills | Status: DC
Start: 2020-08-28 — End: 2021-08-10

## 2020-09-14 ENCOUNTER — Other Ambulatory Visit: Payer: Self-pay | Admitting: Neurological Surgery

## 2020-09-19 ENCOUNTER — Ambulatory Visit: Payer: Self-pay

## 2020-09-20 ENCOUNTER — Encounter: Payer: Self-pay | Admitting: Neurological Surgery

## 2020-09-20 ENCOUNTER — Encounter (INDEPENDENT_AMBULATORY_CARE_PROVIDER_SITE_OTHER): Payer: Self-pay | Admitting: Neurological Surgery

## 2020-09-20 ENCOUNTER — Other Ambulatory Visit: Payer: Self-pay

## 2020-09-20 DIAGNOSIS — Z01818 Encounter for other preprocedural examination: Secondary | ICD-10-CM

## 2020-09-20 NOTE — Discharge Instr - AVS First Page (Signed)
John  F. Hamilton, MD      [facility]  [address]  [place], [place] [address]    Phone :  [phone]      Fax:    [phone]      Postoperative  Instructions following Cervical Fusion Surgery    EXPECTATIONS of RECOVERY:  After surgery, you can expect to feel slightly weak and tired, but you should become stronger every day. It is normal to feel some discomfort around your incision, as well as in your arms. There may be some residual numbness and weakness after surgery, this may take months to go away, and may not ever completely resolve.  Call us if the symptoms suddenly worsen after surgery. The pain will often increase right after surgery and then decrease over time. If your surgery was done from the front of the neck, you can expect to experience a sore throat, difficulty swallowing or voice hoarseness.      ACTIVITY:  No bending or lifting anything greater than about 5-10 pounds  for the first several  weeks after surgery.   It is very important to get up and moving at home, but it is equally important to not overdo activities. Plan to have some assistance at home for the first few days. You will be able to walk, take stairs (carefully and slowly), use the bathroom, and get up from sitting to standing using the arms of the chair as support.   You may feel pressure when having a bowel movement if you had lower back surgery, and that is why we recommend using an over -the-counter  stool softener for the first few weeks after surgery.    You can start walking the day after  surgery and slowly increase your activity back to baseline.  Do not bend, twist or lean to pick things up or change positions. Remember good back mechanics and use your legs to sit down and stand up.    PHYSICAL THERAPY:  We will discuss PT at your first follow up visit.   Generally, PT will not begin until after your 3 month post-op visit when your brace is removed.     DRESSING CARE:  You may  remove the dressing TWO  days after surgery. If your surgery was on a Monday, then on Wednesday evening you can remove the dressing.   Leave the steri strips in place until they fall off themselves. Steri strips are little pieces of tape covering the wound. You will need someone to help you remove the dressing.  Keep your incision clean and dry at all times.   Do not apply any lotions , gel, ointments or other products to your incision unless directed.         SHOWERING:  You may shower on day THREE after the surgery. If your surgery was Monday, you may shower on Thursday. You may remove your brace or collar (if you have one) while in the shower, but avoid bending, flexing or leaning over. You may need assistance.   Cover the wound with occlusive (waterproof) dressing for showering.   You should not let the water hit the incision directly for long periods of time, not should you soak the incision in a bathtub.   When you exit the shower carefully dry the wound with a towel and remove the waterproof dressing.  . You may then swipe an alcohol pad   over the area to further assure it is completely   dry. If the steri-strips are still in place then you may swipe the alcohol around the strips.     MEDICATION:  You will be prescribed pain medications.  You may have pain after the surgery , but will most likely only need to take an opioid  pain medication for a few  weeks. Keep in mind there is an opioid component to the medication and you should avoid driving or operating any vehicle after taking the medication. It can cause drowsiness as well as constipation.   Over - the-counter stool softeners are recommended to avoid constipation. Continue taking the stool softeners while on pain medication to avoid any complications.   If you use all of your pain medication and desire a refill, we will evaluate your situation and you may need a special referral to a pain doctor at that time. Refills are limited to up to a max of 8  weeks.  You may take Tylenol (acetaminophen) for pain, but keep in mind there may be  acetaminophen in your opoid pain medication, so do not take both. Maximum dosage of acetaminophen per day is 4 grams or 400 mg.   Do NOT take any NSAIDS such as Advil, Ibuprofen, Aleve, Motrin, Toradol, or Celebrex for at least 6 weeks post-surgery.  Your surgeon will advise you when to resume these medications.  You may resume your home medications as indicated on your discharge form.  You should NOT resume your blood thinning medication such as Coumadin, Heparin, Aggrenox, Plavix or any Aspirin containing products until instructed to do so by your surgeon.    DRIVING:  You may resume driving after 1 week as long as you are no longer taking opioid pain medications.    SMOKING:  Smoking delays the healing  process; we request that you avoid smoking if possible.      EATING AND DRINKING:  You may resume a normal diet following your procedure.  Vitamins A, C, and Zinc/sulfate will help promote wound healing.  Please avoid alcoholic beverages while taking pain medication.      SEXUAL ACTIVITY:  Sexual activity is not advised until you have followed up with us in the clinic.  The jarring mechanism should be avoided for a short time post operatively.  After 10 days to 2 weeks you may resume activity with your back/ neck supported by the bed/ surface.     WORK:  Do not return to work until you have been advised to do so by staff. Generally we recommend taking 4 weeks off of work for recovery, but each individual situation will be reviewed independently.  We do not recommend you return to work until you have stopped taking opioids ( pain medication).    HOUSEWORK:  Avoid vacuuming or laundry until you have followed up in the clinic. The bending and lifting motion can place pressure on your back and neck    STITCHES OR STAPLES:  You may or may not have stitches or staples.  We will inform you if you have them, for you will need to return  to the office in 7-14 days for their removal.  You need to schedule a nurse visit at [phone] to  have them removed.  If you notice redness, swelling or drainage around your incision, or develop a fever > 101.5 F, please call the office immediately at [phone].     FOLLOW -UP APPOINTMENTS (Routine)  You will follow up 10-14 days after surgery for a wound check with the clinic   nurse.  You will not see your surgical team at this time.  You will see Curley Fayette, NP-C and /or Dr. Hamilton, 4-6 weeks after surgery for a post-op visit with x-rays.    Post-op at 3 months with x-rays, 6 months with x-rays, 1 year with x-rays and 19 months with x-rays     PROBLEMS or CONCERNS:  Please feel free to call the office [phone] at any time for problems, concerns, or questions you may have. We are always available to help you or answer a question.   In an emergency, please call 911 or go to the nearest Emergency Department.     For non-urgent issues or questions, please e-mail Abie Killian, NP-C at  [email or web address].

## 2020-09-20 NOTE — H&P (Signed)
ADMISSION HISTORY AND PHYSICAL EXAM    Date Time: 09/20/20 5:20 PM  Patient Name: Stephanie Castaneda  Attending Physician: Marlaine Hind, MD    Assessment:   Stephanie Castaneda is a 80 year old female with cervical stenosis, most significant at C5-6, with a moderate diffuse disc bulge and osteophyte complex, more prominent to the right, resulting in severe right and moderate left neural foraminal stenosis, as well as moderate diffuse disc bulge and osteophyte complex at C6-7 resulting in moderate to severe bilateral neural foraminal stenosis.     Plan:     C5-7 ACDF    History of Present Illness:   Stephanie Castaneda is a 80 y.o. female who presents to the hospital with with bilateral hand numbness.  Patient has had a 51-month history of paresthesias in the upper and lower extremities. Does not have feeling in the bottom of her feet, no reflexes in the legs.Reports dexterity issues, particularly noted with sewing.     Past Medical History:     Past Medical History:   Diagnosis Date    Cervical stenosis of spinal canal     pre-op dx    Hypercholesterolemia     Hypothyroidism        Past Surgical History:     Past Surgical History:   Procedure Laterality Date    CESAREAN SECTION      HYSTERECTOMY, ABDOMINAL, TOTAL      LAMINECTOMY, ANTERIOR LUMBAR DECOMPRESSION, LEVEL 1      50 years ago     PELVIC FLOOR REPAIR         Family History:   No family history on file.    Social History:     Social History     Socioeconomic History    Marital status: Married     Spouse name: Not on file    Number of children: Not on file    Years of education: Not on file    Highest education level: Not on file   Occupational History    Not on file   Tobacco Use    Smoking status: Never Smoker    Smokeless tobacco: Never Used   Vaping Use    Vaping Use: Never used   Substance and Sexual Activity    Alcohol use: Not Currently    Drug use: Not Currently    Sexual activity: Not Currently   Other Topics Concern    Not on file    Social History Narrative    Not on file     Social Determinants of Health     Financial Resource Strain:     Difficulty of Paying Living Expenses: Not on file   Food Insecurity:     Worried About Running Out of Food in the Last Year: Not on file    Ran Out of Food in the Last Year: Not on file   Transportation Needs:     Lack of Transportation (Medical): Not on file    Lack of Transportation (Non-Medical): Not on file   Physical Activity:     Days of Exercise per Week: Not on file    Minutes of Exercise per Session: Not on file   Stress:     Feeling of Stress : Not on file   Social Connections:     Frequency of Communication with Friends and Family: Not on file    Frequency of Social Gatherings with Friends and Family: Not on file    Attends Religious Services: Not on file  Active Member of Clubs or Organizations: Not on file    Attends Banker Meetings: Not on file    Marital Status: Not on file   Intimate Partner Violence:     Fear of Current or Ex-Partner: Not on file    Emotionally Abused: Not on file    Physically Abused: Not on file    Sexually Abused: Not on file   Housing Stability:     Unable to Pay for Housing in the Last Year: Not on file    Number of Places Lived in the Last Year: Not on file    Unstable Housing in the Last Year: Not on file       Allergies:     Allergies   Allergen Reactions    Oxycodone Nausea And Vomiting    Hydrocodone Nausea And Vomiting       Medications:     No medications prior to admission.       Review of Systems:   Review of Systems   Neurological: Positive for tingling and weakness.     Physical Exam:   There were no vitals filed for this visit.    Intake and Output Summary (Last 24 hours) at Date Time  No intake or output data in the 24 hours ending 09/20/20 1720    Cardiovascular: No murmurs, rubs, or gallops.  Regular rate and rhythm.  No peripheral edema  Respiratory: Auscultation clear bilaterally, no rhonchi, wheezes.  Chest  expansion equal bilaterally.    Awake, alert, oriented x3, Follows commands  GCS: 15   Speech is clear  Attention span and concentration: intact  Recent and remote memory: intact    Gait Intact  Able to walk on toes and heels    Point tenderness: None     Motor Exam:                            R          L            Deltoid                                     5          5  Tricep                                      5          5  Bicep                                       4 -        4  Wrist Extension                       5          5  Wrest Flexion                          5          5  Finger Abduction  4          4  Grip                                         4+        4+  ADM                                        4          4  WE                                          4          4  WF                                           4          4    L2-3Iliopsoas (Hip Flexion)55  L3-4Quadriceps (Knee Extension)55  L5-S1Hamstring (Knee Flexion)55  L4-5Tibialis Anterior (Foot Dorsiflexion)5 4  S1Gastrocsoleus(Plantar Flexion)54  L5EHL (Toe Dorsiflexion) 4 4 -    Rapid alternating movements intact    Sensation intact throughout                Reflexes:  DTRs 1+ throughout      Clonus: right negative, left negative   Babinski: right negative, left negative   Hoffman's: right  negative, left negative    Labs:     Results     Procedure Component Value Units Date/Time    OTHER LABORATORY SCANS [[id number]] Resulted: 09/20/20 1308     Updated: 09/20/20 1308    OTHER LABORATORY SCANS [[id number]] Resulted: 09/20/20 1124     Updated: 09/20/20 1124    OTHER LABORATORY SCANS [[id number]] Resulted: 09/20/20 1124     Updated: 09/20/20 1124          Labs reviewed    Rads:   Radiological Procedure reviewed.    Signed by:  Rondel Oh, NP

## 2020-09-20 NOTE — Pre-Procedure Instructions (Signed)
ANESTHESIA GUIDELINES:  MRSA, U/A T&S, CBC  SURGEON REQUIREMENT:   Medical evaluation  SPECIALIST NOTES/TEST RESULT REQUESTS  PCP office called for medical evaluation, labs, MRSA screening, U/A, EKG spoke to [facility]. Will fax to [phone]  FUTURE PLAN/UPCOMING APPTS:    T&S DOS   EMAIL SENT to  [email or web address] Hospital map & spine video

## 2020-09-21 ENCOUNTER — Ambulatory Visit
Admission: RE | Admit: 2020-09-21 | Discharge: 2020-09-22 | Disposition: A | Discharge status: Home / Self Care | Payer: Medicare PPO | Source: Ambulatory Visit | Attending: Neurological Surgery | Admitting: Neurological Surgery

## 2020-09-21 ENCOUNTER — Ambulatory Visit: Payer: Medicare PPO | Admitting: Anesthesiology

## 2020-09-21 ENCOUNTER — Encounter
Admission: RE | Disposition: A | Discharge status: Home / Self Care | Payer: Self-pay | Source: Ambulatory Visit | Attending: Neurological Surgery

## 2020-09-21 ENCOUNTER — Encounter: Payer: Self-pay | Admitting: Neurological Surgery

## 2020-09-21 ENCOUNTER — Ambulatory Visit: Payer: Medicare PPO

## 2020-09-21 DIAGNOSIS — R2689 Other abnormalities of gait and mobility: Secondary | ICD-10-CM | POA: Insufficient documentation

## 2020-09-21 DIAGNOSIS — M2141 Flat foot [pes planus] (acquired), right foot: Secondary | ICD-10-CM | POA: Insufficient documentation

## 2020-09-21 DIAGNOSIS — M2578 Osteophyte, vertebrae: Secondary | ICD-10-CM | POA: Insufficient documentation

## 2020-09-21 DIAGNOSIS — Z01818 Encounter for other preprocedural examination: Secondary | ICD-10-CM

## 2020-09-21 DIAGNOSIS — M50123 Cervical disc disorder at C6-C7 level with radiculopathy: Secondary | ICD-10-CM

## 2020-09-21 DIAGNOSIS — M19071 Primary osteoarthritis, right ankle and foot: Secondary | ICD-10-CM | POA: Insufficient documentation

## 2020-09-21 DIAGNOSIS — M4802 Spinal stenosis, cervical region: Secondary | ICD-10-CM | POA: Insufficient documentation

## 2020-09-21 DIAGNOSIS — E7800 Pure hypercholesterolemia, unspecified: Secondary | ICD-10-CM | POA: Insufficient documentation

## 2020-09-21 DIAGNOSIS — G629 Polyneuropathy, unspecified: Secondary | ICD-10-CM | POA: Insufficient documentation

## 2020-09-21 DIAGNOSIS — M2142 Flat foot [pes planus] (acquired), left foot: Secondary | ICD-10-CM | POA: Insufficient documentation

## 2020-09-21 DIAGNOSIS — G2581 Restless legs syndrome: Secondary | ICD-10-CM | POA: Insufficient documentation

## 2020-09-21 DIAGNOSIS — M50122 Cervical disc disorder at C5-C6 level with radiculopathy: Secondary | ICD-10-CM

## 2020-09-21 DIAGNOSIS — M19072 Primary osteoarthritis, left ankle and foot: Secondary | ICD-10-CM | POA: Insufficient documentation

## 2020-09-21 DIAGNOSIS — E039 Hypothyroidism, unspecified: Secondary | ICD-10-CM | POA: Insufficient documentation

## 2020-09-21 HISTORY — PX: DISCECTOMY, ANTERIOR CERVICAL, FUSION, LEVELS 2: SHX3751

## 2020-09-21 HISTORY — DX: Hypothyroidism, unspecified: E03.9

## 2020-09-21 HISTORY — DX: Spinal stenosis, cervical region: M48.02

## 2020-09-21 LAB — GLUCOSE WHOLE BLOOD - POCT: Whole Blood Glucose POCT: 109 mg/dL — ABNORMAL HIGH (ref 70–100)

## 2020-09-21 SURGERY — DISCECTOMY, FUSION, CERVICAL, ANTERIOR, LEVELS 2
Anesthesia: Anesthesia General | Site: Spine Cervical | Wound class: Clean

## 2020-09-21 MED ORDER — PROPOFOL 10 MG/ML IV EMUL (WRAP)
INTRAVENOUS | Status: AC
Start: 2020-09-21 — End: ?
  Filled 2020-09-21: qty 100

## 2020-09-21 MED ORDER — VANCOMYCIN 1000 MG IN 250 ML NS IVPB VIAL-MATE (CNR)
INTRAVENOUS | Status: AC
Start: 2020-09-21 — End: 2020-09-21
  Filled 2020-09-21: qty 250

## 2020-09-21 MED ORDER — CELECOXIB 200 MG PO CAPS
200.0000 mg | ORAL_CAPSULE | Freq: Once | ORAL | Status: AC
Start: 2020-09-21 — End: 2020-09-21
  Administered 2020-09-21: 08:00:00 200 mg via ORAL

## 2020-09-21 MED ORDER — BENZOCAINE-MENTHOL 15-3.6 MG MT LOZG
1.0000 | LOZENGE | OROMUCOSAL | Status: DC | PRN
Start: 2020-09-21 — End: 2020-09-22

## 2020-09-21 MED ORDER — NEOSTIGMINE METHYLSULFATE 1 MG/ML IJ/IV SOLN (WRAP)
Status: AC
Start: 2020-09-21 — End: ?
  Filled 2020-09-21: qty 5

## 2020-09-21 MED ORDER — ACETAMINOPHEN 500 MG PO TABS
1000.0000 mg | ORAL_TABLET | Freq: Once | ORAL | Status: AC
Start: 2020-09-21 — End: 2020-09-21
  Administered 2020-09-21: 08:00:00 1000 mg via ORAL

## 2020-09-21 MED ORDER — ALBUMIN HUMAN/BIOSIMILIAR 5% IV SOLN (WRAP)
INTRAVENOUS | Status: AC
Start: 2020-09-21 — End: ?
  Filled 2020-09-21: qty 250

## 2020-09-21 MED ORDER — GABAPENTIN 300 MG PO CAPS
300.0000 mg | ORAL_CAPSULE | Freq: Three times a day (TID) | ORAL | Status: DC
Start: 2020-09-21 — End: 2020-09-22
  Administered 2020-09-21 – 2020-09-22 (×3): 300 mg via ORAL
  Filled 2020-09-21 (×4): qty 1

## 2020-09-21 MED ORDER — METOCLOPRAMIDE HCL 5 MG/ML IJ SOLN
10.0000 mg | Freq: Once | INTRAMUSCULAR | Status: DC | PRN
Start: 2020-09-21 — End: 2020-09-21

## 2020-09-21 MED ORDER — ONDANSETRON HCL 4 MG/2ML IJ SOLN
INTRAMUSCULAR | Status: AC
Start: 2020-09-21 — End: ?
  Filled 2020-09-21: qty 2

## 2020-09-21 MED ORDER — CELECOXIB 200 MG PO CAPS
ORAL_CAPSULE | ORAL | Status: AC
Start: 2020-09-21 — End: 2020-09-21
  Filled 2020-09-21: qty 1

## 2020-09-21 MED ORDER — FENTANYL CITRATE (PF) 50 MCG/ML IJ SOLN (WRAP)
INTRAMUSCULAR | Status: AC
Start: 2020-09-21 — End: ?
  Filled 2020-09-21: qty 2

## 2020-09-21 MED ORDER — VASOPRESSIN 20 UNIT/ML IV SOLN
INTRAVENOUS | Status: AC
Start: 2020-09-21 — End: ?
  Filled 2020-09-21: qty 1

## 2020-09-21 MED ORDER — THROMBIN 5000 UNITS EX SOLR
CUTANEOUS | Status: DC | PRN
Start: 2020-09-21 — End: 2020-09-21
  Administered 2020-09-21: 15000 [IU] via TOPICAL

## 2020-09-21 MED ORDER — FLEET ENEMA 7-19 GM/118ML RE ENEM
1.0000 | ENEMA | Freq: Once | RECTAL | Status: DC | PRN
Start: 2020-09-21 — End: 2020-09-22

## 2020-09-21 MED ORDER — LIDOCAINE-EPINEPHRINE 1 %-1:100000 IJ SOLN
INTRAMUSCULAR | Status: DC | PRN
Start: 2020-09-21 — End: 2020-09-21
  Administered 2020-09-21: 7 mL

## 2020-09-21 MED ORDER — FENTANYL CITRATE (PF) 50 MCG/ML IJ SOLN (WRAP)
INTRAMUSCULAR | Status: AC
Start: 2020-09-21 — End: 2020-09-22
  Filled 2020-09-21: qty 2

## 2020-09-21 MED ORDER — ACETAMINOPHEN 325 MG PO TABS
650.0000 mg | ORAL_TABLET | Freq: Four times a day (QID) | ORAL | Status: DC
Start: 2020-09-21 — End: 2020-09-22
  Administered 2020-09-21 – 2020-09-22 (×5): 650 mg via ORAL
  Filled 2020-09-21 (×4): qty 2

## 2020-09-21 MED ORDER — TRAMADOL HCL 50 MG PO TABS
50.0000 mg | ORAL_TABLET | ORAL | Status: DC | PRN
Start: 2020-09-21 — End: 2020-09-22

## 2020-09-21 MED ORDER — DIPHENHYDRAMINE HCL 25 MG PO CAPS
25.0000 mg | ORAL_CAPSULE | Freq: Four times a day (QID) | ORAL | Status: DC | PRN
Start: 2020-09-21 — End: 2020-09-22

## 2020-09-21 MED ORDER — LIDOCAINE HCL 1 % IJ SOLN
1.0000 mL | Freq: Once | INTRAMUSCULAR | Status: DC | PRN
Start: 2020-09-21 — End: 2020-09-21

## 2020-09-21 MED ORDER — GLYCOPYRROLATE 0.2 MG/ML IJ SOLN (WRAP)
INTRAMUSCULAR | Status: DC | PRN
Start: 2020-09-21 — End: 2020-09-21
  Administered 2020-09-21: .2 mg via INTRAVENOUS

## 2020-09-21 MED ORDER — BACITRACIN ZINC 500 UNIT/GM EX OINT
TOPICAL_OINTMENT | CUTANEOUS | Status: AC
Start: 2020-09-21 — End: ?
  Filled 2020-09-21: qty 15

## 2020-09-21 MED ORDER — AMMONIA AROMATIC IN INHA
1.0000 | Freq: Once | RESPIRATORY_TRACT | Status: DC | PRN
Start: 2020-09-21 — End: 2020-09-21

## 2020-09-21 MED ORDER — SENNOSIDES-DOCUSATE SODIUM 8.6-50 MG PO TABS
1.0000 | ORAL_TABLET | Freq: Two times a day (BID) | ORAL | Status: DC
Start: 2020-09-21 — End: 2020-09-22
  Administered 2020-09-21 – 2020-09-22 (×2): 1 via ORAL
  Filled 2020-09-21 (×2): qty 1

## 2020-09-21 MED ORDER — LACTATED RINGERS IV SOLN
75.0000 mL/h | INTRAVENOUS | Status: DC
Start: 2020-09-21 — End: 2020-09-21
  Administered 2020-09-21: 12:00:00 75 mL/h via INTRAVENOUS

## 2020-09-21 MED ORDER — THROMBIN 5000 UNITS EX SOLR
CUTANEOUS | Status: AC
Start: 2020-09-21 — End: ?
  Filled 2020-09-21: qty 45000

## 2020-09-21 MED ORDER — ROCURONIUM BROMIDE 50 MG/5ML IV SOLN
INTRAVENOUS | Status: AC
Start: 2020-09-21 — End: ?
  Filled 2020-09-21: qty 5

## 2020-09-21 MED ORDER — DEXAMETHASONE SODIUM PHOSPHATE 20 MG/5ML IJ SOLN
INTRAMUSCULAR | Status: AC
Start: 2020-09-21 — End: ?
  Filled 2020-09-21: qty 5

## 2020-09-21 MED ORDER — GLYCOPYRROLATE 0.2 MG/ML IJ SOLN (WRAP)
INTRAMUSCULAR | Status: AC
Start: 2020-09-21 — End: ?
  Filled 2020-09-21: qty 1

## 2020-09-21 MED ORDER — METHOCARBAMOL 500 MG PO TABS
ORAL_TABLET | ORAL | Status: AC
Start: 2020-09-21 — End: 2020-09-22
  Filled 2020-09-21: qty 2

## 2020-09-21 MED ORDER — METHOCARBAMOL 500 MG PO TABS
750.0000 mg | ORAL_TABLET | Freq: Three times a day (TID) | ORAL | Status: DC | PRN
Start: 2020-09-21 — End: 2020-09-22
  Administered 2020-09-21: 13:00:00 750 mg via ORAL

## 2020-09-21 MED ORDER — NEOSTIGMINE METHYLSULFATE 1 MG/ML IJ/IV SOLN (WRAP)
Status: DC | PRN
Start: 2020-09-21 — End: 2020-09-21
  Administered 2020-09-21: 1.5 mg via INTRAVENOUS

## 2020-09-21 MED ORDER — LIDOCAINE HCL (PF) 2 % IJ SOLN
INTRAMUSCULAR | Status: AC
Start: 2020-09-21 — End: ?
  Filled 2020-09-21: qty 5

## 2020-09-21 MED ORDER — OXYCODONE HCL 5 MG PO TABS
5.0000 mg | ORAL_TABLET | ORAL | Status: DC | PRN
Start: 2020-09-21 — End: 2020-09-21

## 2020-09-21 MED ORDER — GABAPENTIN 300 MG PO CAPS
300.0000 mg | ORAL_CAPSULE | Freq: Once | ORAL | Status: AC
Start: 2020-09-21 — End: 2020-09-21
  Administered 2020-09-21: 08:00:00 300 mg via ORAL

## 2020-09-21 MED ORDER — REMIFENTANIL HCL 1 MG IV SOLR
INTRAVENOUS | Status: AC
Start: 2020-09-21 — End: ?
  Filled 2020-09-21: qty 2000

## 2020-09-21 MED ORDER — ONDANSETRON HCL 4 MG/2ML IJ SOLN
INTRAMUSCULAR | Status: DC | PRN
Start: 2020-09-21 — End: 2020-09-21
  Administered 2020-09-21: 4 mg via INTRAVENOUS

## 2020-09-21 MED ORDER — LEVOTHYROXINE SODIUM 88 MCG PO TABS
88.0000 ug | ORAL_TABLET | Freq: Every day | ORAL | Status: DC
Start: 2020-09-22 — End: 2020-09-22
  Administered 2020-09-22: 06:00:00 88 ug via ORAL
  Filled 2020-09-21: qty 1

## 2020-09-21 MED ORDER — LIDOCAINE HCL 2 % IJ SOLN
INTRAMUSCULAR | Status: DC | PRN
Start: 2020-09-21 — End: 2020-09-21
  Administered 2020-09-21: 50 mg

## 2020-09-21 MED ORDER — OXYCODONE HCL 5 MG PO TABS
5.0000 mg | ORAL_TABLET | Freq: Once | ORAL | Status: DC | PRN
Start: 2020-09-21 — End: 2020-09-21

## 2020-09-21 MED ORDER — SODIUM CHLORIDE 0.9 % IV SOLN
INTRAVENOUS | Status: DC | PRN
Start: 2020-09-21 — End: 2020-09-21
  Administered 2020-09-21: 09:00:00 15 ug/min via INTRAVENOUS

## 2020-09-21 MED ORDER — B COMPLEX VITAMINS PO CAPS
1.0000 | ORAL_CAPSULE | Freq: Every day | ORAL | Status: DC
Start: 2020-09-28 — End: 2021-08-10

## 2020-09-21 MED ORDER — OXYCODONE HCL 5 MG PO TABS
10.0000 mg | ORAL_TABLET | ORAL | Status: DC | PRN
Start: 2020-09-21 — End: 2020-09-21

## 2020-09-21 MED ORDER — LACTATED RINGERS IV SOLN
INTRAVENOUS | Status: DC | PRN
Start: 2020-09-21 — End: 2020-09-21

## 2020-09-21 MED ORDER — NALOXONE HCL 0.4 MG/ML IJ SOLN (WRAP)
0.4000 mg | INTRAMUSCULAR | Status: DC | PRN
Start: 2020-09-21 — End: 2020-09-22

## 2020-09-21 MED ORDER — ROCURONIUM BROMIDE 10 MG/ML IV SOLN (WRAP)
INTRAVENOUS | Status: DC | PRN
Start: 2020-09-21 — End: 2020-09-21
  Administered 2020-09-21: 50 mg via INTRAVENOUS
  Administered 2020-09-21: 10 mg via INTRAVENOUS

## 2020-09-21 MED ORDER — HYDROMORPHONE HCL 1 MG/ML IJ SOLN
INTRAMUSCULAR | Status: AC
Start: 2020-09-21 — End: ?
  Filled 2020-09-21: qty 1

## 2020-09-21 MED ORDER — SOLIFENACIN SUCCINATE 10 MG PO TABS
10.0000 mg | ORAL_TABLET | Freq: Every day | ORAL | Status: DC
Start: 2020-09-22 — End: 2020-09-22
  Administered 2020-09-22: 10:00:00 10 mg via ORAL
  Filled 2020-09-21 (×2): qty 1

## 2020-09-21 MED ORDER — FENTANYL CITRATE (PF) 50 MCG/ML IJ SOLN (WRAP)
INTRAMUSCULAR | Status: DC | PRN
Start: 2020-09-21 — End: 2020-09-21
  Administered 2020-09-21 (×4): 25 ug via INTRAVENOUS

## 2020-09-21 MED ORDER — MEPERIDINE HCL 25 MG/ML IJ SOLN
25.0000 mg | INTRAMUSCULAR | Status: DC | PRN
Start: 2020-09-21 — End: 2020-09-21

## 2020-09-21 MED ORDER — CEFAZOLIN SODIUM 1 G IJ SOLR
INTRAMUSCULAR | Status: AC
Start: 2020-09-21 — End: ?
  Filled 2020-09-21: qty 1000

## 2020-09-21 MED ORDER — FENTANYL CITRATE (PF) 50 MCG/ML IJ SOLN (WRAP)
25.0000 ug | INTRAMUSCULAR | Status: DC | PRN
Start: 2020-09-21 — End: 2020-09-21
  Administered 2020-09-21: 25 ug via INTRAVENOUS

## 2020-09-21 MED ORDER — HYDROMORPHONE HCL 0.5 MG/0.5 ML IJ SOLN
0.5000 mg | INTRAMUSCULAR | Status: DC | PRN
Start: 2020-09-21 — End: 2020-09-21

## 2020-09-21 MED ORDER — FAMOTIDINE 10 MG/ML IV SOLN (WRAP)
INTRAVENOUS | Status: DC | PRN
Start: 2020-09-21 — End: 2020-09-21
  Administered 2020-09-21: 20 mg via INTRAVENOUS

## 2020-09-21 MED ORDER — DEXAMETHASONE SODIUM PHOSPHATE 4 MG/ML IJ SOLN (WRAP)
INTRAMUSCULAR | Status: DC | PRN
Start: 2020-09-21 — End: 2020-09-21
  Administered 2020-09-21: 10 mg via INTRAVENOUS

## 2020-09-21 MED ORDER — ACETAMINOPHEN 500 MG PO TABS
ORAL_TABLET | ORAL | Status: AC
Start: 2020-09-21 — End: 2020-09-21
  Filled 2020-09-21: qty 2

## 2020-09-21 MED ORDER — CEFAZOLIN SODIUM-DEXTROSE 2-5 GM/50ML-% IV SOLN
2.0000 g | Freq: Three times a day (TID) | INTRAVENOUS | Status: AC
Start: 2020-09-21 — End: 2020-09-21
  Administered 2020-09-21 (×2): 2 g via INTRAVENOUS
  Filled 2020-09-21 (×2): qty 2

## 2020-09-21 MED ORDER — PROPOFOL 10 MG/ML IV EMUL (WRAP)
INTRAVENOUS | Status: AC
Start: 2020-09-21 — End: ?
  Filled 2020-09-21: qty 50

## 2020-09-21 MED ORDER — PROPOFOL 10 MG/ML IV EMUL (WRAP)
INTRAVENOUS | Status: AC
Start: 2020-09-21 — End: ?
  Filled 2020-09-21: qty 20

## 2020-09-21 MED ORDER — ROSUVASTATIN CALCIUM 10 MG PO TABS
10.0000 mg | ORAL_TABLET | Freq: Every evening | ORAL | Status: DC
Start: 2020-09-21 — End: 2020-09-22
  Administered 2020-09-21: 21:00:00 10 mg via ORAL
  Filled 2020-09-21 (×2): qty 1

## 2020-09-21 MED ORDER — HYDROMORPHONE HCL 0.5 MG/0.5 ML IJ SOLN
0.4000 mg | INTRAMUSCULAR | Status: DC | PRN
Start: 2020-09-21 — End: 2020-09-22
  Administered 2020-09-21: 0.4 mg via INTRAVENOUS
  Filled 2020-09-21: qty 1

## 2020-09-21 MED ORDER — LIDOCAINE 4 % EX CREA
TOPICAL_CREAM | Freq: Once | CUTANEOUS | Status: DC | PRN
Start: 2020-09-21 — End: 2020-09-21

## 2020-09-21 MED ORDER — ACETAMINOPHEN 325 MG PO TABS
ORAL_TABLET | ORAL | Status: AC
Start: 2020-09-21 — End: 2020-09-22
  Filled 2020-09-21: qty 2

## 2020-09-21 MED ORDER — LACTATED RINGERS IV SOLN
INTRAVENOUS | Status: DC
Start: 2020-09-21 — End: 2020-09-21
  Administered 2020-09-21: 08:00:00 1000 mL via INTRAVENOUS

## 2020-09-21 MED ORDER — BISACODYL 10 MG RE SUPP
10.0000 mg | Freq: Every day | RECTAL | Status: DC | PRN
Start: 2020-09-21 — End: 2020-09-22

## 2020-09-21 MED ORDER — ONDANSETRON HCL 4 MG/2ML IJ SOLN
4.0000 mg | Freq: Three times a day (TID) | INTRAMUSCULAR | Status: DC | PRN
Start: 2020-09-21 — End: 2020-09-22

## 2020-09-21 MED ORDER — MAGNESIUM HYDROXIDE 400 MG/5ML PO SUSP
10.0000 mL | ORAL | Status: DC | PRN
Start: 2020-09-21 — End: 2020-09-22

## 2020-09-21 MED ORDER — EPHEDRINE SULFATE 50 MG/ML IJ/IV SOLN (WRAP)
Status: AC
Start: 2020-09-21 — End: ?
  Filled 2020-09-21: qty 1

## 2020-09-21 MED ORDER — VANCOMYCIN 1000 MG IN 250 ML NS IVPB VIAL-MATE (CNR)
1000.0000 mg | INTRAVENOUS | Status: AC
Start: 2020-09-21 — End: 2020-09-21
  Administered 2020-09-21 (×2): 1000 mg via INTRAVENOUS

## 2020-09-21 MED ORDER — PROPOFOL 10 MG/ML IV EMUL (WRAP)
INTRAVENOUS | Status: DC | PRN
Start: 2020-09-21 — End: 2020-09-21
  Administered 2020-09-21: 150 mg via INTRAVENOUS

## 2020-09-21 MED ORDER — GABAPENTIN 300 MG PO CAPS
ORAL_CAPSULE | ORAL | Status: AC
Start: 2020-09-21 — End: 2020-09-21
  Filled 2020-09-21: qty 1

## 2020-09-21 MED ORDER — PROPOFOL INFUSION 10 MG/ML
INTRAVENOUS | Status: DC | PRN
Start: 2020-09-21 — End: 2020-09-21
  Administered 2020-09-21: 15 ug/kg/min via INTRAVENOUS

## 2020-09-21 MED ORDER — ONDANSETRON 4 MG PO TBDP
4.0000 mg | ORAL_TABLET | Freq: Three times a day (TID) | ORAL | Status: DC | PRN
Start: 2020-09-21 — End: 2020-09-22

## 2020-09-21 MED ORDER — ONDANSETRON HCL 4 MG/2ML IJ SOLN
4.0000 mg | Freq: Once | INTRAMUSCULAR | Status: DC | PRN
Start: 2020-09-21 — End: 2020-09-21

## 2020-09-21 MED ORDER — SODIUM CHLORIDE 0.9 % IV SOLN
INTRAVENOUS | Status: DC
Start: 2020-09-21 — End: 2020-09-22

## 2020-09-21 SURGICAL SUPPLY — 167 items
ADHESIVE LIQUID PUMP SPRAY BOTTLE (Prep) ×1 IMPLANT
ADHESIVE LIQUID PUMP SPRAY BOTTLE BENZOIN 4 OZ (Prep) ×1 IMPLANT
ADHESIVE LIQUID WATERPROOF VIAL PREP NONSTAIN MASTISOL STYRAX GUM (Skin Closure) ×1 IMPLANT
ADHESIVE LQ STYRAX GUM MASTIC ALC MTHY (Skin Closure) ×1 IMPLANT
ALLOGRAFT PRIMAGEN 1CC (Allograft) ×2 IMPLANT
ATTACHMENT SMKEVC ACVC STRL FLXB CAUT (Tubing) ×1 IMPLANT
ATTACHMENT SMOKE EVACUATOR FLEXIBLE CAUTERY HANDSWITCH ACCUVAC (Tubing) ×1 IMPLANT
BANDAGE ADH PLSTR POLYACRYLATE CVRL 2YD (Dressing) ×1
BANDAGE ADHESIVE L2 YD X W6 IN STRETCH (Dressing) ×1 IMPLANT
BANDAGE ADHESIVE L2 YD X W6 IN STRETCH NONWOVEN POROUS COVER-ROLL (Dressing) ×1 IMPLANT
BANDAGE STERI-STRIP 0.5X4IN (Dressing) ×1
BANDAGE STERI-STRIP 1/4INX4IN (Dressing) ×1
BLADE NEPTUNE ELECTRODE 70MM (Blade) ×2
BULB DRAINAGE LIGHTWEIGHT LOW LEVEL (Drain) IMPLANT
BULB DRAINAGE LIGHTWEIGHT LOW LEVEL SUCTION RELIAVAC SILICONE 100 CC (Drain) IMPLANT
BULB DRN SIL 100CC LF STRL LTWT LO LVL (Drain)
BUR DISSECTING MATCH 14CMX30MM (Burr)
CAGE ROIC LORD 12X15.5MM H7 (Cage) ×2 IMPLANT
CAGE SPINAL ROI-C W15.5 MM X H7 MM D12 MM TITANIUM CERVICAL (Cage) ×2 IMPLANT
CAGE SPINAL ROIC (Cage) ×2 IMPLANT
CAGE SPNL TI .41CC 7D 12MM ROI-C 15.5X7 (Cage) ×2 IMPLANT
CAP IV FEMALE LUER LOCK DEHP FREE (Drain) IMPLANT
CATH JELCO IV RADIOPQ 14GX2IN (IV Supply) ×1
CATHETER IV JELCO 14GA 2IN STRL RADOPQ (IV Supply) ×1
CATHETER IV OD14 GA L2 IN RADIOPAQUE (IV Supply) ×1 IMPLANT
CATHETER IV OD14 GA L2 IN RADIOPAQUE JELCO (IV Supply) ×1 IMPLANT
CNSR MEDIVAC CRD LINER W LID (Suction) ×2
CNTNRW LID CLR 8OZ (Suction) ×1
CONTAINER SPEC 8OZ NS SNPON LID TRNLU (Suction) ×1 IMPLANT
DRAIN INCS SIL RND 3/32IN 49IN LF STRL (Drain)
DRAIN SUCTION ROUND 7FR (Drain)
DRAN EVACUATOR WOUND 100CC (Drain)
DRAPE INST 20X16IN LF STRL MAG DISP GRN (Drape) ×1
DRAPE INSTRUMENT L20 IN X W16 IN MAGNETIC GREEN (Drape) ×1 IMPLANT
DRAPE INSTRUMENT MAGNETIC L20 IN X W16 (Drape) ×1 IMPLANT
DRAPE MAGNETIC INSTRMT 16X20IN (Drape) ×1
DRAPE SRG PE STRDRP 17X11IN LF STRL ADH (Drape) ×4 IMPLANT
DRAPE SRG SM STRDRP 25X22IN LF STRL ADH (Drape) ×1
DRAPE SRG TBRN LG CNVRT 98X72IN LF STRL (Drape) ×1
DRAPE STERI MINOR-PROCEDURE (Drape) ×1
DRAPE SURGICAL ADHESIVE APERTURE FLUID (Drape) ×1 IMPLANT
DRAPE SURGICAL ADHESIVE APERTURE FLUID CONTROL L25 IN X W22 IN (Drape) ×1 IMPLANT
DRAPE SURGICAL ADHESIVE STRIP SMALL (Drape) ×4
DRAPE SURGICAL ADHESIVE STRIP SMALL TOWEL MATTE FINISH L17 IN X W11 IN (Drape) ×4 IMPLANT
DRAPE SURGICAL FANFOLD L98 IN X W72 IN (Drape) ×1 IMPLANT
DRAPE SURGICAL FANFOLD L98 IN X W72 IN CONVERTORS TIBURON LARGE (Drape) ×1 IMPLANT
DRESSING STRETCH GAUZE 6INX2YD (Dressing) ×1
DRESSING SURG PATTY .5X.5IN (Dressing)
DRESSING TEGADERM 4X4X3/4IN (Dressing) ×1
DRESSING TRANSPARENT L4 3/4 IN X W4 IN (Dressing) ×1 IMPLANT
DRESSING TRANSPARENT L4 3/4 IN X W4 IN POLYURETHANE ADHESIVE (Dressing) ×1 IMPLANT
DRESSING TRNS PU STD TGDRM 4.75X4IN LF (Dressing) ×1
ELECTRODE ADULT PATIENT RETURN L9 FT REM POLYHESIVE ACRYLIC FOAM (Procedure Accessories) ×1 IMPLANT
ELECTRODE BLADE INSL/CTD 125MM (Endoscopic Supplies)
ELECTRODE ELECTROSURGICAL BLADE L125 MM (Endoscopic Supplies) IMPLANT
ELECTRODE ELECTROSURGICAL BLADE L125 MM NEPTUNE E-SEP INSULATE COATED (Endoscopic Supplies) IMPLANT
ELECTRODE ELECTROSURGICAL BLADE L70 MM (Blade) ×2 IMPLANT
ELECTRODE ELECTROSURGICAL BLADE L70 MM NEPTUNE E-SEP INSULATE COATED (Blade) ×2 IMPLANT
ELECTRODE ESURG BLDE NPTN E-SEP 125MM LF (Endoscopic Supplies)
ELECTRODE ESURG BLDE NPTN E-SEP 70MM LF (Blade) ×2
ELECTRODE PATIENT RETURN L9 FT VALLEYLAB (Procedure Accessories) ×1 IMPLANT
ELECTRODE PT RTN RM PHSV ACRL FM C30- LB (Procedure Accessories) ×1
FORCEPS ELECTROSURGICAL L7 3/4 IN 1.5 MM BAYONET BIPOLAR INSULATE CORD (Disposable Instruments) ×1 IMPLANT
FORCEPS ESURG SS 1.5MM BYNT LBRTY S-G (Disposable Instruments) ×1 IMPLANT
FORCEPS SCVL BAYONET 7-3/4IN (Disposable Instruments) ×1
GLOVE SRG 9 BGL PI INDCTR UNDERGLOVE 301 (Glove) ×1
GLOVE SRG PLISPRN 9 BGL PI MIC 301MM LF (Glove) ×1
GLOVE SURGICAL 9 BIOGEL PI INDICATOR (Glove) ×1 IMPLANT
GLOVE SURGICAL 9 BIOGEL PI INDICATOR UNDERGLOVE POWDER FREE SMOOTH (Glove) ×1 IMPLANT
GLOVE SURGICAL 9 BIOGEL PI MICRO POWDER (Glove) ×1 IMPLANT
GLOVE SURGICAL 9 BIOGEL PI MICRO POWDER FREE ANTISLIP MICRO ROUGHEN (Glove) ×1 IMPLANT
GLOVE UNDER PI BLUE SZ 9 (Glove) ×1
GLOVES BIOGEL PI MICRO SZ 9 (Glove) ×1
GOWN SMART IMP BREATHABLE XXLG (Gown) ×1
GOWN SRG 2XL SMARTGOWN LF STRL LVL 4 (Gown) ×1
GOWN SURGICAL 2XL SMARTGOWN LEVEL 4 (Gown) ×1 IMPLANT
GOWN SURGICAL 2XL SMARTGOWN LEVEL 4 BREATHABLE (Gown) ×1 IMPLANT
GRAFT BN 1CC PRIMAGEN ADV ALGRF (Allograft) ×2 IMPLANT
GRAFT BONE 1 CC ALLOGRAFT PRIMAGEN ADVANCED (Allograft) ×2 IMPLANT
KIT DURAPREP ANTIMICROBIAL (Prep) ×2
KIT HMST GELTN MTRX THRMB 10ML 13CM FLSL (Hemostat) ×2 IMPLANT
LINER SUCTION 3000 CC LOCK LID SHUTOFF (Suction) ×1 IMPLANT
MASTISOL VIAL 2/3CC STRL (Skin Closure) ×1
MRKR SKN W RULER AND LABELS (Positioning Supplies) ×1
NEEDLE 25GA 1 1/2 (Needles) ×1
NEEDLE HPO SS PP RW BD 25GA 1.5IN LF (Needles) ×1 IMPLANT
NEEDLE HPO SS PP THNWL BD 19GA 1.5IN LF (Needles) ×3 IMPLANT
NEEDLE REG BEVEL 19GX1.5IN (Needles) ×3
NEEDLE SPINAL BD OD18 GA L3 1/2 IN (Needles) IMPLANT
NEEDLE SPINAL DISP 18GX3.5IN (Needles)
NEEDLE SPINAL L3 1/2 IN REGULAR WALL QUINCKE TIP OD18 GA BD (Needles) IMPLANT
NEEDLE SPNL PP RW BD QNCK 18GA 3.5IN LF (Needles)
PAD ELECTROSRG GRND REM W CRD (Procedure Accessories) ×1
PEN SRGMRK 6IN LF STRL RLR LG RSRV REG (Positioning Supplies) ×1
PEN SURGICAL MARKING MEDLINE SKIN RULER (Positioning Supplies) ×1 IMPLANT
PEN SURGICAL MARKING SKIN RULER LARGE RESERVOIR REGULAR TIP LABEL (Positioning Supplies) ×1 IMPLANT
PENCIL SMKEVC LF COAT PSHBTN (Cautery) ×1
PENCIL SMOKE EVAC 70MM PSH BTN (Cautery) ×1
PENCIL SMOKE EVACUATOR COATED PUSH (Cautery) ×1 IMPLANT
PENCIL SMOKE EVACUATOR COATED PUSH BUTTON NEPTUNE E-SEP (Cautery) ×1 IMPLANT
PIN DISTRACTION STRL 14MM (Procedure Accessories) IMPLANT
PIN FIXATION THREADED TRINICA STAINLESS STEEL LARGE (Guide Pin) ×2 IMPLANT
PIN FIXATION TRINICA LARGE (Guide Pin) ×4 IMPLANT
PIN FX LG TRNC (Guide Pin) ×2
PLATE BN INVIZIA 40MM 2 LVL SPNE ANT CRV (Plate) ×1 IMPLANT
PLATE L40 MM SPINE 2 LEVEL INVIZIA BONE (Plate) ×1 IMPLANT
PLATE L40 MM SPINE ANTERIOR CERVICAL 2 LEVEL INVIZIA BONE (Plate) ×1 IMPLANT
PLATE TRINICA LOPRO 40MM (Plate) ×1 IMPLANT
SCREW BN TI AL VNDM INVIZIA 4.2MM 14MM (Screw) ×6 IMPLANT
SCREW BONE L14 MM OD4.2 MM TITANIUM ALLOY SPINE ANTERIOR CERVICAL SELF (Screw) ×6 IMPLANT
SCREW DSTRCTN 12MM (Other) ×4 IMPLANT
SCREW L14 MM OD4.2 MM TITANIUM ALUMINUM (Screw) ×6 IMPLANT
SCREW SLF DRILL 4.2X14 (Screw) ×6 IMPLANT
SEALANT FLOSEAL FAST PREP 10ML (Hemostat) ×2
SHEET LARGE DRAPE 72X86IN (Drape) ×1
SLN ALC ISOPROPYL 70 4OZ (Scrub Supplies) ×1
SMOKE EVACUATION ATTACHMENT (Tubing) ×1
SOL NACL INJ 0.9% 30ML BACTER (IV Solutions) ×2
SOL NACL.9% 1000ML IRR NONLTX (Irrigation Solutions) ×1
SOLUTION ANSEP 70% ISPRP 4OZ LF RUB BTL (Scrub Supplies) ×1
SOLUTION ANTISEPTIC RUB BOTTLE MEDLINE (Scrub Supplies) ×1 IMPLANT
SOLUTION IRR 0.9% NACL 1000ML LF STRL (Irrigation Solutions) ×1
SOLUTION IRRIGATION 0.9% SODIUM CHLORIDE (Irrigation Solutions) ×1 IMPLANT
SOLUTION IRRIGATION 0.9% SODIUM CHLORIDE 1000 ML PLASTIC POUR BOTTLE (Irrigation Solutions) ×1 IMPLANT
SOLUTION IV 0.9% SODIUM CHLORIDE 30 ML (IV Solutions) ×1 IMPLANT
SOLUTION IV 0.9% SODIUM CHLORIDE 30 ML BACTERIOSTATIC MULTIPLE DOSE (IV Solutions) ×1 IMPLANT
SOLUTION SRGPRP 74% ISPRP 0.7% IOD (Prep) ×2
SOLUTION SURGICAL PREP 26 ML DURAPREP (Prep) ×2 IMPLANT
SOLUTION SURGICAL PREP 26 ML DURAPREP 74% ISOPROPYL ALCOHOL 0.7% (Prep) ×2 IMPLANT
SPNG ABSORBABLE GELATIN (Hemostat) ×2
SPONGE ABSORBABLE L12.5 CM X W8 CM (Hemostat) ×1 IMPLANT
SPONGE ABSORBABLE L12.5 CM X W8 CM PORCINE GELATIN SURGIFOAM THK10 MM (Hemostat) ×1 IMPLANT
SPONGE SRG CTTND CDMN SUTUREWELD .5X.5IN (Dressing)
SPONGE SRGCL 1/2X1/2IN ABSRBNT PRECISION CUT RDPQ PTT CTTND CDMN (Dressing) IMPLANT
SPONGE SURGICAL L1/2 IN X W1/2 IN (Dressing) IMPLANT
SPRAY BENOZOIN 3.5 OZ (Prep) ×2
STRIP SKIN CLOSURE L4 IN X W1/2 IN (Dressing) ×1 IMPLANT
STRIP SKIN CLOSURE L4 IN X W1/2 IN REINFORCE STERI-STRIP POLYESTER (Dressing) ×1 IMPLANT
STRIP SKIN CLOSURE L4 IN X W1/4 IN (Dressing) ×1 IMPLANT
STRIP SKIN CLOSURE L4 IN X W1/4 IN REINFORCE STERI-STRIP POLYESTER (Dressing) ×1 IMPLANT
STRIP SKNCLS PLSTR STRSTRP 4X.25IN LF (Dressing) ×1
STRIP SKNCLS PLSTR STRSTRP 4X.5IN LF (Dressing) ×1
SUTURE ABS 3-0 SH VCL 18IN CR BRD 8 STRN (Suture) ×1
SUTURE ABS 4-0 PS2 MNCRL MTPS 18IN MFL (Suture) ×1
SUTURE COATED VICRYL 3-0 SH L18 IN (Suture) ×1 IMPLANT
SUTURE COATED VICRYL 3-0 SH L18 IN CONTROL BRD 8 STRAND UNDYED ABSRBBL (Suture) ×1 IMPLANT
SUTURE MONOCRYL 4-0 PS-2 L18 IN (Suture) ×1 IMPLANT
SUTURE MONOCRYL 4-0 PS-2 L18 IN MONOFILAMENT UNDYED ABSORBABLE (Suture) ×1 IMPLANT
SUTURE MONOCRYL 4-0 PS2 18 IN (Suture) ×1
SUTURE VICRYL 3-0 SH 8X18IN (Suture) ×1
SYRINGE 10 ML GRADUATE NONPYROGENIC DEHP (Syringes, Needles) ×3 IMPLANT
SYRINGE 10 ML GRADUATE NONPYROGENIC DEHP FREE PVC FREE LOK MEDICAL (Syringes, Needles) ×3 IMPLANT
SYRINGE 20 ML BD LUER-LOK MEDICAL (Syringes, Needles) ×1 IMPLANT
SYRINGE LUER LOCK 10CC (Syringes, Needles) ×3
SYRINGE LUER-LOK STERILE 20CC (Syringes, Needles) ×1
SYRINGE MED 10ML LL LF STRL GRAD N-PYRG (Syringes, Needles) ×3
SYRINGE MED 20ML LL LF STRL (Syringes, Needles) ×1 IMPLANT
TBNG CON SCTN STRL .1875X10 (Tubing) ×4
TOOL DISSECTING L14 CM MATCH HEAD FLUTE (Burr) IMPLANT
TOOL DISSECTING L14 CM MATCH HEAD FLUTE OD3 MM MIDAS REX LEGEND (Burr) IMPLANT
TOOL DSCT LGND 3MM 14MM (Burr)
TOWEL STERIDRAPE 17X11IN (Drape) ×4
TRAY SPINE ~~LOC~~ (Pack) ×1
TRAY SRG SPINE ~~LOC~~ (Pack) ×1 IMPLANT
TRAY SURGICAL SPINE (Pack) ×1 IMPLANT
TUBING SUCTION ID3/16 IN L10 FT (Tubing) ×2 IMPLANT
TUBING SUCTION ID3/16 IN L10 FT NONCONDUCTIVE FML CONNECTOR MEDLN (Tubing) ×2 IMPLANT

## 2020-09-21 NOTE — Progress Notes (Addendum)
Spine Navigator met with Patient. Pt POD 0 from  Procedure(s):  C5-C7 ACDF.  Pt reports pain, 3/10.  Pt has been out of bed and ambulating. aspen utilized at all times. Aspen collar is large have called ortho tech to adjust collar.       ASSESSMENT:  Orientation: Oriented x4  Sensation: diminished  Strength: normal throughout upper and lower extremities  Negative for gas   - Voiding without difficulty  Dressing to neck Clean/Dry/Intact.       Temp over past 24 hrs: Temp (24hrs), Avg:98.4 F (36.9 C), Min:98 F (36.7 C), Max:99 F (37.2 C)     Recent Vital Signs:  Visit Vitals  BP 134/71   Pulse 96   Temp 99 F (37.2 C) (Oral)   Resp 18   Ht 1.549 m (5\' 1" )   Wt 55.8 kg (123 lb)   SpO2 98%   BMI 23.24 kg/m          EDUCATION:  Educated pt on use of IS in preventing post-op PNA and ankle pumps in preventing DVT. Advised pt not to take NSAIDS. Reviewed Spinal Precautions (no bending, lifting or twisting).    DISCHARGE PLANNING:  Discharge likely Tomorrow.   Pt has not been evaluated by Occupational Therapy.   Pt has family/ daughter for support upon discharge.

## 2020-09-21 NOTE — Anesthesia Preprocedure Evaluation (Signed)
Anesthesia Evaluation    AIRWAY    Mallampati: III    TM distance: >3 FB  Neck ROM: limited  Mouth Opening:full   CARDIOVASCULAR    regular and normal       DENTAL    no notable dental hx     PULMONARY    clear to auscultation     OTHER FINDINGS                              Anesthesia Plan    ASA 3     general                     intravenous induction   Detailed anesthesia plan: general endotracheal        Post op pain management: per surgeon    informed consent obtained    Plan discussed with CRNA.    ECG reviewed  pertinent labs reviewed             Signed by: Lovey Newcomer, MD 09/21/20 6:52 AM

## 2020-09-21 NOTE — Progress Notes (Addendum)
NEUROSURGERY POST OP CHECK    Date Time: 09/21/20 1:01 PM  Patient Name: Stephanie Castaneda, Stephanie Castaneda  MRN #[id number]    Subjective:   Emerging from anesthesia  Paresthesia resolved    Assessment:   80 y.o. female POD#0 s/p C5-7 ACDF    Plan:   - Transfer to surgical floor when PACU criteria met.  - Medical management per primary  - BP parameters: SBP < 150  - q4h neurochecks  - Postop imaging: none  - Drain care: none  - Bracing: Aspen at all times  - Activity ad lib  - Multimodal pain control with QD bowel regimen  - Resume home medications except AC/AP  - ADAT to regular diet  - PT/OT eval and treat  - Encourage IS, mobilization  - SCDs  - No anticoagulants/NSAIDs/antiplatelets.  - Hold chemo dvt ppx at this time.  - Anticipated discharge home 1/14    Physical Exam:     Vitals:    09/21/20 1240   BP: 143/65   Pulse: 74   Resp: 14   Temp:    SpO2: 100%       Intake and Output Summary (Last 24 hours) at Date Time    Intake/Output Summary (Last 24 hours) at 09/21/2020 1301  Last data filed at 09/21/2020 1205  Gross per 24 hour   Intake 1250 ml   Output 415 ml   Net 835 ml       Neuro Exam:  Face symmetric, pupils equal bilaterally  Speech fluent  Moving all extremities spontaneously  5/5 BUE and BLE with exception of R tricep 4/5  Sensation grossly intact to light touch, bilateral hand paresthesia resolved   Incision: CDI, flat    Signed by: Carmela Hurt, PA

## 2020-09-21 NOTE — Transfer of Care (Signed)
Anesthesia Transfer of Care Note    Patient: Stephanie Castaneda    Procedures performed: Procedure(s) with comments:  C5-C7 ACDF - C5-C7 ACDF    Anesthesia type: General ETT    Patient location:Phase I PACU    Last vitals:   Vitals:    09/21/20 1206   BP: 151/68   Pulse: 70   Resp: 17   Temp:    SpO2: 100%       Post pain: Patient not complaining of pain, continue current therapy      Mental Status:drowsy    Respiratory Function: tolerating face mask    Cardiovascular: stable    Nausea/Vomiting: patient not complaining of nausea or vomiting    Hydration Status: adequate    Post assessment: no apparent anesthetic complications    Signed by: Justin Mend, CRNA  09/21/20 12:06 PM

## 2020-09-21 NOTE — Brief Op Note (Signed)
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 Item Serial No. Manufacturer Lot No. LRB No. Used Action   ALLOGRAFT PRIMAGEN 1CC - [id number] Allograft ALLOGRAFT PRIMAGEN 1CC [id number] [employer] NA N/A 1 Implanted   CAGE ROIC LORD 12X15.5MM H7 - SNA Cage CAGE ROIC LORD 12X15.5MM H7 NA [employer] [id number] N/A 1 Implanted   ALLOGRAFT PRIMAGEN 1CC - [id number] Allograft ALLOGRAFT PRIMAGEN 1CC [id number] [employer] NA N/A 1 Implanted   CAGE ROIC LORD 12X15.5MM H7 - SNA Cage CAGE ROIC LORD 12X15.5MM H7 NA [employer]MER [employer]ET [id number] N/A 1 Implanted   PLATE TRINICA LOPRO - [id number] Plate PLATE TRINICA LOPRO  [employer]  N/A 1 Implanted   SCREW SLF DRILL 4.2X14 - [id number] Screw SCREW SLF DRILL 4.2X14  [employer]IMMER [employer]ET  N/A 6 Implanted       Drains:       Specimens:   * No specimens in log *      Findings:       Complications:         Signed by: Marlaine Hind, MD                                                                           [place] [facility]

## 2020-09-21 NOTE — Anesthesia Postprocedure Evaluation (Signed)
Anesthesia Post Evaluation    Patient: Stephanie Castaneda    Procedure(s) with comments:  C5-C7 ACDF - C5-C7 ACDF    Anesthesia type: general    Last Vitals:   Vitals Value Taken Time   BP 153/75 09/21/20 1350   Temp 36.7 C (98.1 F) 09/21/20 1350   Pulse 100 09/21/20 1350   Resp 23 09/21/20 1350   SpO2 98 % 09/21/20 1350                 Anesthesia Post Evaluation:       Level of Consciousness: awake    Pain Management: adequate    Airway Patency: patent    Anesthetic complications: No          Cardiovascular status: acceptable  Respiratory status: acceptable  Hydration status: acceptable        Signed by: Lovey Newcomer, MD, 09/21/2020 3:14 PM

## 2020-09-21 NOTE — Progress Notes (Signed)
Orientation/Neuro: A&0X4, neurovascular checked every 4 hours. Numbness and tingling sensation felt on the lower extremities specially on left foot (its her baseline).   Tele/Cont Pulse Ox: not on telemetry, no complain of chest pain.   Oxygen/Airway: Clear On RA sating 95%   - Encouraged to do incentive spirometer reached 600  Pain Management: 5 moderate pain on the right shoulder, given dilaudid IV.   Lines/Drips/Cont Fluid: PIV to G18 left hand patent and not swelling.   Diet/Tolerating: Tolerating only apple sauce for now.   GI/GU: Output/ Last BM: Voiding freely and measure,   Ambulatory/T&R: Pt ambulatory with 1 person assist with walker.   Fall Score/Safety:High Falls Risk. Precautions in place. Floor mat present, bed alarm on, non-skid socks on.  Skin/Wounds: Wound care with aspen collar.

## 2020-09-22 ENCOUNTER — Encounter: Payer: Self-pay | Admitting: Neurological Surgery

## 2020-09-22 LAB — PRESURGICAL SURVEILLANCE, MSSA+MRSA

## 2020-09-22 MED ORDER — ACETAMINOPHEN 325 MG PO TABS
650.0000 mg | ORAL_TABLET | Freq: Four times a day (QID) | ORAL | 0 refills | Status: AC
Start: 2020-09-22 — End: 2020-09-29

## 2020-09-22 MED ORDER — ACETAMINOPHEN 325 MG PO TABS
650.0000 mg | ORAL_TABLET | Freq: Four times a day (QID) | ORAL | 0 refills | Status: DC
Start: 2020-09-22 — End: 2020-09-22

## 2020-09-22 MED ORDER — GABAPENTIN 300 MG PO CAPS
300.0000 mg | ORAL_CAPSULE | Freq: Three times a day (TID) | ORAL | 3 refills | Status: DC
Start: 2020-09-22 — End: 2020-12-13

## 2020-09-22 MED ORDER — GABAPENTIN 300 MG PO CAPS
300.0000 mg | ORAL_CAPSULE | Freq: Three times a day (TID) | ORAL | 3 refills | Status: DC
Start: 2020-09-22 — End: 2020-09-22

## 2020-09-22 MED ORDER — TRAMADOL HCL 50 MG PO TABS
50.0000 mg | ORAL_TABLET | ORAL | 0 refills | Status: AC | PRN
Start: 2020-09-22 — End: 2020-09-29

## 2020-09-22 MED ORDER — TRAMADOL HCL 50 MG PO TABS
50.0000 mg | ORAL_TABLET | Freq: Four times a day (QID) | ORAL | 0 refills | Status: DC | PRN
Start: 2020-09-22 — End: 2020-09-22

## 2020-09-22 MED ORDER — METHOCARBAMOL 750 MG PO TABS
750.0000 mg | ORAL_TABLET | Freq: Three times a day (TID) | ORAL | 0 refills | Status: DC | PRN
Start: 2020-09-22 — End: 2020-09-22

## 2020-09-22 MED ORDER — CALCITONIN (SALMON) 200 UNIT/ACT NA SOLN
1.0000 | Freq: Every day | NASAL | 0 refills | Status: DC
Start: 2020-09-22 — End: 2020-11-01

## 2020-09-22 MED ORDER — CALCITONIN (SALMON) 200 UNIT/ACT NA SOLN
1.0000 | Freq: Every day | NASAL | 0 refills | Status: DC
Start: 2020-09-22 — End: 2020-09-22

## 2020-09-22 MED ORDER — METHOCARBAMOL 750 MG PO TABS
750.0000 mg | ORAL_TABLET | Freq: Three times a day (TID) | ORAL | 0 refills | Status: AC | PRN
Start: 2020-09-22 — End: 2020-10-06

## 2020-09-22 MED ORDER — HYDROMORPHONE HCL 2 MG PO TABS
1.0000 mg | ORAL_TABLET | Freq: Four times a day (QID) | ORAL | 0 refills | Status: DC | PRN
Start: 2020-09-22 — End: 2020-09-22

## 2020-09-22 NOTE — Progress Notes (Signed)
NEUROSURGERY PROGRESS NOTE    Date Time: 09/22/20 10:00 AM  Patient Name: Stephanie Castaneda  Consulting Attending Physician: Dr. Deloria Lair  Covered By: Bartolo Darter PA-C    Assessment:   80 y.o. female POD#1 s/p C5-7 ACDF.    Plan:   - Discharge home after PT evaluation   - Bracing: Aspen at all times  - Activity ad lib  - Multimodal pain control with QD bowel regimen. Sent to patient's home pharmacy as her insurance is not accepted by our pharmacy.   - Resume home medications except AC/AP  - PT/OT eval and treat. OT recommending home with supervision, PT eval pending.  - No anticoagulants/NSAIDs/antiplatelets.    Remove dressing tomorrow, POD2. Ok to shower on POD3  Follow up in 2 weeks with RN for wound check   Follow up in 6 weeks with Dr. Deloria Lair for post-op visit.     Interim History:   Patient seen and examined this AM. NAEO. She reports that the numbness and tightness in b/l hands has greatly improved. She also reports that radicular pain into RUE has improved, it is now only into shoulder. She rates pain at 2/10 and says current regimen has been managing pain well. She denies any other complaints at this time.     Medications:     Current Facility-Administered Medications   Medication Dose Route Frequency   . acetaminophen  650 mg Oral 4 times per day   . gabapentin  300 mg Oral Q8H SCH   . levothyroxine  88 mcg Oral Daily at 0600   . rosuvastatin  10 mg Oral QHS   . senna-docusate  1 tablet Oral BID   . solifenacin  10 mg Oral Daily         Physical Exam:     Vitals:    09/22/20 0711   BP: 148/69   Pulse: 77   Resp: 17   Temp: 97.8 F (36.6 C)   SpO2: 93%       Intake and Output Summary (Last 24 hours) at Date Time    Intake/Output Summary (Last 24 hours) at 09/22/2020 1000  Last data filed at 09/21/2020 1855  Gross per 24 hour   Intake 1400 ml   Output 1115 ml   Net 285 ml       Neuro Exam:  Face symmetric, pupils equal bilaterally  Speech fluent  Moving all extremities spontaneously  5/5 BUE and BLE with  exception of R tricep 4/5  Sensation grossly intact to light touch, bilateral hand numbness resolved     Incision: dressing clean/dry/intact    Labs:   No results found for: WBC, HGB, HCT, MCV, PLT  No results found for: NA, K, CL, CO2  No results found for: INR, PT    Rads:     Radiology Results (24 Hour)     Procedure Component Value Units Date/Time    Fluoroscopy less than 1 hour [[id number]] Collected: 09/21/20 1210    Order Status: Completed Updated: 09/21/20 1213    Narrative:      HISTORY: ACDF     TECHNIQUE: Fluoroscopic imaging.    FINDINGS:   Fluoroscopic guidance was provided for this operative procedure,  performed without the presence of a radiologist.  Fluoroscopy time: 51 seconds  Number of fluoroscopic images obtained: 6      Impression:         Fluoroscopic guidance.            Janina Mayo, MD  09/21/2020 12:11 PM            Signed by: Ferman Hamming, PA    Date/Time: 09/22/20 10:00 AM

## 2020-09-22 NOTE — PT Progress Note (Signed)
[facility]   Physical Therapy Cancellation Note      Patient:  Stephanie Castaneda MRN#:  [id number]  Unit:  [facility] CCW GROUND UNIT Room/Bed:  FG31/FG31.01    09/22/2020  Time: 12:35 PM        Pt not seen for physical therapy secondary to per Occupational Therapy evaluation, pt is mobilizing independently.  No skilled PT needs identified. Please reconsult if new concerns arise. D/C PT.    Thank you,  Gifford Shave, PT [phone]

## 2020-09-22 NOTE — Progress Notes (Signed)
Spine Navigator met with Patient. Pt POD 1 from  Procedure(s):  C5-C7 ACDF.  Pt reports pain, 2/10.  Pt has been out of bed and ambulating. aspen utilized at all times.       ASSESSMENT:  Orientation: Oriented x4  Sensation: intact   Strength: normal throughout upper and lower extremities  Positive for gas   - Voiding without difficulty  Dressing to neck Clean/Dry/Intact.       Temp over past 24 hrs: Temp (24hrs), Avg:98.2 F (36.8 C), Min:97.1 F (36.2 C), Max:99 F (37.2 C)     Recent Vital Signs:  Visit Vitals  BP 148/69   Pulse 77   Temp 97.8 F (36.6 C) (Oral)   Resp 17   Ht 1.549 m (5\' 1" )   Wt 55.8 kg (123 lb)   SpO2 93%   BMI 23.24 kg/m          EDUCATION:  Educated pt on use of IS in preventing post-op PNA and ankle pumps in preventing DVT. Advised pt not to take NSAIDS. Reviewed Spinal Precautions (no bending, lifting or twisting).    DISCHARGE PLANNING:  Discharge likely pending PT   Pt has been evaluated by Occupational Therapy.   Pt has daughter  for support upon discharge.

## 2020-09-22 NOTE — UM Notes (Signed)
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   [place], [place] [address]  [email or web address]    For Immediate assistance:  Case Management Office:  Ph: [phone]  Fax: [phone]  Please use fax number to provide authorization for hospital services or to request additional information

## 2020-09-22 NOTE — OT Eval Note (Signed)
[facility]   Occupational Therapy Evaluation/Discharge    Patient: Stephanie Castaneda    MRN#: [id number]   Unit: [facility] CCW GROUND UNIT  Bed: FG31/FG31.01                                   Post Acute Care Therapy Recommendations:   Discharge Recommendations: Home with supervision     Milestones to be reached to achieve recommendation: None  Anticipate achievement in NA sessions    DME Recommended for Discharge: Tub transfer bench,Long-handled sponge [facility] shoehorn)        Therapy discharge recommendations may change with patient status.  Please refer to most recent note for up-to-date recommendations.    Assessment:   Significant Findings: None    Stephanie Castaneda is a 80 y.o. female admitted 09/21/2020.  Patient presents s/p C5-C7 ACDF with ASPEN collar donned. Pt reports slight numbness and tingly in B hands but significantly decreased from baseline, has WFL AROM and FMC. OT trained pt in spinal precautions, log roll technique, LB dressing, UB dressing, and home safety. Pt completed toilet t/f EOB>toilet with FWW and supervision with cues for spinal precautions during stand>sit on toilet. OT educated pt on DME recs for optimal safety in home. OT trained pt on compensatory strategies for feeding ADL, pt verbalized understanding. Pt reports d/cing to daughter's home and will have assist as needed. Rec d/c OT services at this time as pt verbalized understanding of precautions and training for safe d/c home.       Therapy Diagnosis: Decreased I with ADLs    Rehabilitation Potential: Good    Treatment Activities: Evaluation, self care  Educated the patient to role of occupational therapy, plan of care, goals of therapy and safety with mobility and ADLs, spine precautions, discharge instructions, home safety.    Plan:   D/C acute OT services     Treatment/Interventions: Self Care, Pt education, equipment education, home safety, compensatory strategies for ADLs    Risks/benefits/POC discussed with  pt       Precautions and Contraindications:   C-Spine precautions  L foot drop  ASPEN collar at all times    Consult received for Stephanie Castaneda for OT Evaluation and Treatment.  Patients medical condition is appropriate for Occupational Therapy intervention at this time.      History of Present Illness:    Stephanie Castaneda is a 80 y.o. female admitted on 09/21/2020 with s/p C5-C7 ACDF.    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 Stephanie Mayo, MD  09/21/2020 12:11 PM      Social History:   Prior Level of Function:  independent  Assistive Devices: FWW, walking stick  Baseline Activity: Community Mobility  DME Currently at Home: Raised toilet seat  Home Living Arrangements: Will d/c to daughter's home  Type of Home: [facility]  Home Layout: Will walk down stairs to basement    Subjective:    Patient is agreeable to participation in the therapy session.  Patient Goal: Use my hands  Pain:   Pt denies pain    Objective:   Patient is in bed with dressings, SCD's and cervical collar in place.  Pt wore mask during therapy session:Yes    Cognitive Status and Neuro Exam:  A&Ox4    Musculoskeletal Examination  Gross ROM: WFL (within spine precautions)  Gross Strength: WFL (within spine precautions)      Sensory/Oculomotor Examination  Auditory: WFL  Tactile: WFL  Vision: WFL      Activities of Daily Living  Eating: Independent  Grooming: Independent  Bathing: Mod Independent  UE Dressing: Mod Independent  LE Dressing: Mod  Independent  Toileting: Mod Independent    Functional Mobility:  Supine to Sit: Independent  Sit to Stand: Independent  Transfers: Supervision with FWW     PMP - Progressive  Mobility Protocol   PMP Activity: Step 7 - Walks out of Room  Distance Walked (ft) (Step 6,7): 100 Feet     Balance  Static Sitting: Good  Dynamic Sitting: Good  Static Standing: Good with FWW  Dynamic Standing: Good with FWW    Participation and Activity Tolerance  Participation Effort: Good  Endurance: Good    Patient left with call bell within reach, all needs met, SCDs on , fall mat in place, bed alarm on, chair alarm off and all questions answered. RN notified of session outcome and patient response.            PPE worn during session: procedural mask, goggles and gloves    Tech present: No  PPE worn by tech: N/A           Time of treatment:   OT Received On: 09/22/20  Start Time: 0800  Stop Time: 0900  Time Calculation (min): 60 min

## 2020-09-22 NOTE — Discharge Summary -  Nursing (Signed)
Patient discharged to Home with aspen collar.  PIV removed, catheter intact.  Patient educated on when to call 911 and/or come back to hospital.   Patient was educated and instructed all the restrictions and things that needs to know at home.  Patient educated on medications and how to take them as prescribed. (Instructed to pick up medications at pharmacy of choice).   AVS reviewed, patient verbalized understanding. All questions answered. Signed copy in chart.   Patient left at approximately this time with all belongings.   Patient left in stable condition.

## 2020-09-22 NOTE — Progress Notes (Signed)
Case management discharge note    Patient is medically clear to be discharged home and patient agreed to be discharged home today. Family to transport patient.     Dispo: Home with no need.      09/22/20 1331   Discharge Disposition   Patient preference/choice provided? Yes   Physical Discharge Disposition Home   Mode of Transportation Car   Patient/Family/POA notified of transfer plan Yes   Patient agreeable to discharge plan/expected d/c date? Yes   Family/POA agreeable to discharge plan/expected d/c date? Yes   Bedside nurse notified of transport plan? Yes   CM Interventions   Follow up appointment scheduled? No   Reason no follow up scheduled? Family to schedule   Referral made for home health RN visit? Does not meet home bound criteria     Harlon Ditty RN BSN CM I  [employer] [facility] Case management  [facility]  [address],  [place] [address]   [email or web address]  [phone]

## 2020-09-22 NOTE — Progress Notes (Signed)
Orientation/Neuro: A&0X4, neurovascular checked every 4 hours. Numbness and tingling sensation felt on the lower extremities specially on left foot (its her baseline).   Tele/Cont Pulse Ox: not on telemetry, no complain of chest pain.   Oxygen/Airway: Clear On RA sating 95%   - Encouraged to do incentive spirometer.  Pain Management: 5 moderate pain on the right shoulder, given dilaudid IV.   Lines/Drips/Cont Fluid: PIV to G18 left hand patent and not swelling.   Diet/Tolerating: Tolerating breakfast  GI/GU: Output/ Last BM: Voiding freely and measure to the bathroom  Ambulatory/T&R: Pt ambulatory with 1 person assist with walker.   Fall Score/Safety:High Falls Risk. Precautions in place. Floor mat present, bed alarm on, non-skid socks on.  Skin/Wounds: Wound care with aspen collar(new).

## 2020-09-25 NOTE — Op Note (Signed)
Date of surgery 09/21/20     Surgeon [name] MD PhD     First assistant Rowe Robert        Preop diagnosis:     1.  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  C5-6 anterior cervical discectomy with fusion via anterior approach  2.  C6-7 anterior cervical discectomy with fusion by anterior approach  3.  C5-6 intervertebral cage placement  4.  C6-7  intervertebral cage placement  5.  Placement of anterior cervical plate with vertebral body screws into C5-C6-C7  6.  Microsurgical technique  7.  Allograft morselized  8.  Autograft morselized        Indications:80 y.o. female who presents to the hospital with with bilateral hand numbness. Patient has had a 7-month history of paresthesias in the upper and lower extremities. Does not have feeling in the bottom of her feet, no reflexes in the legs.Reports dexterity issues, particularly noted with sewing. Here today to proceed with surgical intervention.     Description of procedure: Patient was brought into the operative room.  Patient was intubated.  Patient was padded prepped and draped in regular sterile fashion.  Right transverse incision was made through major skin fold.  Platysma was transected and then undermined.  Dissection was carried out to the mid cervical fascia, medial to the sternocleidomastoid and carotid lateral to the tracheoesophageal complex.  Prevertebral fascia was opened sharply.  Spinal needles were placed and localizing x-ray was obtained.  C5-6 disc level was marked.  Longus coli was undermined.  Self-retaining tractors were placed.  Caspar pins were placed.  Microscope was draped and brought into the field.  The space was incised from uncovertebral joint to uncovertebral  joint.  Superficial followed by deeper discectomy was performed.  Deeper discectomy was carried out with removal of an anterior osteophyte with the Midas Rex and the posterior osteophyte with Midas Rex.  PLL was then opened with a 15 blade and nerve.  PLL was removed with 2  millimeter Kerrison.  Foraminal decompression was carried out with 2 and 1 mm Kerrison.  Spinal cord and thecal sac and exiting nerve roots were decompressed.  Space was sized and a 6 mm cage was found to be appropriate.  Morselized bone graft from the removal of the anterior osteophyte was combined with allograft and placed into the cage which was then countersunk 1 mm.  Additional bone was placed around the cage.     Caspar pins were removed.  Retractors were adjusted to the C6-7 level.  Caspar pins were placed at C6-7.  Disc space was widened.  Disc space was incised from uncovertebral joint to uncovertebral joint.  Superficial followed by deeper discectomy was carried out.  PLL was opened with 15 blade and nerve hook.  PLL was removed with 2 mm and 1 mm Kerrison decompressing the thecal sac and exiting nerve roots.  Space was sized and a 7 mm cage was found to be appropriate.  Theautograft from the bone shavings were combined with allograft morselized.  The intervertebral cage was countersunk 1 mm.  A 42 mm plate was then measured and positioned and vertebral body screws were then placed at  C5-C6-C7.  Locking mechanisms were triggered and AP and lateral x-rays were obtained.  Wound was irrigated.  Soft tissue was inspected and no injury to the soft tissue was noted.  Platysma was closed with 3-0 Vicryl.  Dermis was closed 3-0 Vicryl.  4-0 Monocryl was used to close the subcuticular layer.  Wounds dressed with Steri-Strips gauze and Tegaderm.  Patient was extubated and transferred to recovery.  Aspen collar was placed.     [name] MD PhD

## 2020-09-26 ENCOUNTER — Encounter: Payer: Self-pay | Admitting: Neurological Surgery

## 2020-10-04 ENCOUNTER — Encounter (INDEPENDENT_AMBULATORY_CARE_PROVIDER_SITE_OTHER): Payer: Self-pay

## 2020-10-04 ENCOUNTER — Other Ambulatory Visit (INDEPENDENT_AMBULATORY_CARE_PROVIDER_SITE_OTHER): Payer: Self-pay | Admitting: Nurse Practitioner

## 2020-10-04 ENCOUNTER — Ambulatory Visit (INDEPENDENT_AMBULATORY_CARE_PROVIDER_SITE_OTHER): Payer: Medicare PPO

## 2020-10-04 DIAGNOSIS — Z5189 Encounter for other specified aftercare: Secondary | ICD-10-CM

## 2020-10-04 DIAGNOSIS — Z981 Arthrodesis status: Secondary | ICD-10-CM

## 2020-10-04 NOTE — Progress Notes (Addendum)
Patient and daughter here for nurse visit wound check for ACDF C5-C7 performed by Dr. Deloria Lair on 09/21/2020. Pt continues to wear cervical collar. Pt reports collar not fitting properly. Will provide pt number for  [employer] to refit. Pt continues to use cane to assist with ambulation. No discomfort reported other than ill fitting collar. Pt provided contact information for [employer] since it is close to where she is staying with daughter. It was preferred rather than the hospital where cervical braces are maintain.The incision site inspected and steri strips ready to be removed and same gently done. The incision site remains C/D/I with closed wound border. Incision appears to be healing appropriately. Dry 2x2 applied to cover incision during the travel home to prevent friction rub than pt to remove dressing and allow incision to air. Pt may resume shower with assistance. No application of anything to incision nor submerging incision in water. Pt to maintain restrictions as given in hospital. Pt request XR order for appt with Dr. Deloria Lair be sent to Dr. Duaine Dredge, PCP in [facility] and she will bring the XR back with her to appt on 11/01/2020. All questions answered.

## 2020-10-04 NOTE — Patient Instructions (Signed)
Contact office if any question or concerns.

## 2020-10-13 ENCOUNTER — Other Ambulatory Visit (INDEPENDENT_AMBULATORY_CARE_PROVIDER_SITE_OTHER): Payer: Self-pay | Admitting: Student in an Organized Health Care Education/Training Program

## 2020-10-26 ENCOUNTER — Other Ambulatory Visit: Payer: Self-pay

## 2020-10-26 ENCOUNTER — Ambulatory Visit
Admission: RE | Admit: 2020-10-26 | Discharge: 2020-10-26 | Disposition: A | Discharge status: Home / Self Care | Payer: Medicare PPO | Source: Ambulatory Visit | Attending: Family Medicine | Admitting: Family Medicine

## 2020-10-26 ENCOUNTER — Other Ambulatory Visit: Payer: Self-pay | Admitting: Family Medicine

## 2020-10-26 DIAGNOSIS — M4322 Fusion of spine, cervical region: Secondary | ICD-10-CM

## 2020-11-01 ENCOUNTER — Ambulatory Visit (INDEPENDENT_AMBULATORY_CARE_PROVIDER_SITE_OTHER): Payer: Medicare PPO | Admitting: Neurological Surgery

## 2020-11-01 ENCOUNTER — Ambulatory Visit
Admission: RE | Admit: 2020-11-01 | Discharge: 2020-11-01 | Disposition: A | Discharge status: Home / Self Care | Payer: Self-pay | Source: Ambulatory Visit

## 2020-11-01 ENCOUNTER — Other Ambulatory Visit: Payer: Self-pay

## 2020-11-01 VITALS — BP 168/78 | HR 109 | Resp 18 | Ht 61.0 in | Wt 121.0 lb

## 2020-11-01 DIAGNOSIS — G6289 Other specified polyneuropathies: Secondary | ICD-10-CM

## 2020-11-01 DIAGNOSIS — Z981 Arthrodesis status: Secondary | ICD-10-CM

## 2020-11-01 MED ORDER — CALCITONIN (SALMON) 200 UNIT/ACT NA SOLN
1.0000 | Freq: Every day | NASAL | 5 refills | Status: DC
Start: 2020-11-01 — End: 2021-08-10

## 2020-11-01 NOTE — Progress Notes (Signed)
[facility] Neurosurgery  Postop Note    HPI     Stephanie Castaneda is a 80 year-old female six weeks s/p C5-7 ACDF.Hand pain, strength, and numbness has improved. VAS 0/10. Numbness in the right thumb, otherwise improved. Reports residual tingling. Right hand still feeling weak. No dysphagia.  Does endorse bilateral foot numbness.     Physical Examination   VITAL SIGNS:    Visit Vitals  BP 168/78   Pulse (!) 109   Resp 18   Ht 1.549 m (5\' 1" )   Wt 54.9 kg (121 lb)   BMI 22.86 kg/m            Awake, alert, oriented x3, Follows commands  GCS: 15   Speech is clear  Attention span and concentration: intact  Recent and remote memory: intact    Gait Intact  Able to walk on toes and heels    Point tenderness: None     Motor Exam:   R L   Deltoid    5 5  Tricep    5 5  Bicep    5 5  Wrist Extension  5 5  Wrest Flexion   5 5  Finger Abduction  5 5  Grip    5 5    Bilateral lower extremities 5/5    Rapid alternating movements intact    Sensation intact bilaterlly     Reflexes:  DTRs symmetric      Clonus: right negative, left negative   Babinski: right negative, left negative   Hoffman's: right  negative, left negative    Wound Check: no erythema, cellulitis, or purulence    Radiology Interpretation   XRay cervical spine    Stable instrumented cervical fusion C5-7, hardware stable, no malalignment.    10/26/2020    Impression/Plan     Stephanie Castaneda is a 54 year-old female six weeks s/p C5-7 ACDF. I personally reviewed the x-ray studies of the cervical spine, showing a stable instrumented fusion without hardware failure or malalignment. She may discontinue ASPEN cervical collar. May resume quilting. No overhead work or lifting greater than 10 pounds for an additional six weeks. She will follow up in six weeks with xray studies of the cervical spine. Continue bone stimulator and calcitonin.    Referral placed for neurology to evaluate for peripheral neuropathy.  Follow-up   6 weeks with xray studies of the cervical  spine    All relevant and clinical information was transcribed by me, Rondel Oh, NP-C, acting as a scribe for Dr. [name].    Marlaine Hind, MD

## 2020-11-06 ENCOUNTER — Encounter (INDEPENDENT_AMBULATORY_CARE_PROVIDER_SITE_OTHER): Payer: Self-pay

## 2020-11-09 ENCOUNTER — Encounter (INDEPENDENT_AMBULATORY_CARE_PROVIDER_SITE_OTHER): Payer: Self-pay | Admitting: Neurological Surgery

## 2020-12-07 ENCOUNTER — Ambulatory Visit
Admission: RE | Admit: 2020-12-07 | Discharge: 2020-12-07 | Disposition: A | Discharge status: Home / Self Care | Payer: Medicare PPO | Source: Ambulatory Visit | Attending: Family Medicine | Admitting: Family Medicine

## 2020-12-07 ENCOUNTER — Other Ambulatory Visit: Payer: Self-pay

## 2020-12-07 ENCOUNTER — Other Ambulatory Visit: Payer: Self-pay | Admitting: Family Medicine

## 2020-12-07 DIAGNOSIS — Z981 Arthrodesis status: Secondary | ICD-10-CM

## 2020-12-12 ENCOUNTER — Encounter (INDEPENDENT_AMBULATORY_CARE_PROVIDER_SITE_OTHER): Payer: Self-pay | Admitting: Neurological Surgery

## 2020-12-13 ENCOUNTER — Ambulatory Visit (INDEPENDENT_AMBULATORY_CARE_PROVIDER_SITE_OTHER): Payer: Medicare PPO | Admitting: Neurological Surgery

## 2020-12-13 VITALS — BP 162/77 | HR 89 | Resp 18 | Ht 61.0 in | Wt 121.0 lb

## 2020-12-13 DIAGNOSIS — Z981 Arthrodesis status: Secondary | ICD-10-CM

## 2020-12-13 NOTE — Progress Notes (Signed)
Stephanie Castaneda  Follow up Note    Previous Impression/Plan     Stephanie Castaneda is a 80 year-old female six weeks s/p C5-7 ACDF. I personally reviewed the x-ray studies of the cervical spine, showing a stable instrumented fusion without hardware failure or malalignment. She may discontinue ASPEN cervical collar. May resume quilting. No overhead work or lifting greater than 10 pounds for an additional six weeks. She will follow up in six weeks with xray studies of the cervical spine. Continue bone stimulator and calcitonin.    Referral placed for neurology to evaluate for peripheral neuropathy.    HPI     Stephanie Castaneda is a 72 year-old female three months s/p C5-7 ACDF. She is doing well today. VAS 0/10. Endorses right hand numbness. Feeling heat more than prior to surgery. Otherwise doing well.    Physical Examination   VITAL SIGNS:    Visit Vitals  BP 162/77   Pulse 89   Resp 18   Ht 1.549 m (5\' 1" )   Wt 54.9 kg (121 lb)   BMI 22.86 kg/m          Awake, alert, oriented x3, Follows commands  GCS: 15   Speech is clear  Attention span and concentration: intact  Recent and remote memory: intact    Gait Intact  Able to walk on toes and heels    Point tenderness: None     Motor Exam:                            R          L            Deltoid                                     5          5  Tricep                                      5          5  Bicep                                       5          5  Wrist Extension                       5          5  Wrest Flexion                          5          5  Finger Abduction                     5          5  Grip                                         5  5    Bilateral lower extremities 5/5    Rapid alternating movements intact    Sensation intact bilaterlly                Reflexes:  DTRs symmetric      Clonus: right negative, left negative   Babinski: right negative, left negative   Hoffman's: right  negative, left  negative    Review of Systems   Review of Systems   All other systems reviewed and are negative.    Radiology Interpretation   XRAY Cervical   12/08/2020    Impression: post-surgical changes after C5-7 ACDF without complicating features.    Impression/Plan     Stephanie Castaneda is a 40 year-old female three months s/p C5-7 ACDF. I personally reviewed the x-ray studies of the cervical spine showing a stable instrumented fusion without evidence of hardware failure or malalignment. Will refer to PT, improve posture. Continue bone stimulator and calcitonin nasal spray. She will follow-up in three months with repeat xray studies of the cervical spine. Cleared to work in the yard.    Follow-up   3 months with xray cervical spine    All relevant and clinical information was transcribed by me, Rondel Oh, NP-C, acting as a scribe for Dr. [name].    Marlaine Hind, MD

## 2020-12-18 ENCOUNTER — Other Ambulatory Visit: Payer: Self-pay

## 2020-12-18 ENCOUNTER — Ambulatory Visit
Admission: RE | Admit: 2020-12-18 | Discharge: 2020-12-18 | Disposition: A | Discharge status: Home / Self Care | Payer: Self-pay | Source: Ambulatory Visit

## 2020-12-19 ENCOUNTER — Other Ambulatory Visit: Payer: Self-pay

## 2020-12-19 ENCOUNTER — Encounter: Payer: Self-pay | Admitting: Physical Therapy

## 2020-12-19 ENCOUNTER — Ambulatory Visit: Payer: Medicare PPO | Attending: Registered" | Admitting: Physical Therapy

## 2020-12-19 DIAGNOSIS — R2681 Unsteadiness on feet: Secondary | ICD-10-CM | POA: Diagnosis present

## 2020-12-19 DIAGNOSIS — R2689 Other abnormalities of gait and mobility: Secondary | ICD-10-CM | POA: Insufficient documentation

## 2020-12-19 DIAGNOSIS — M5412 Radiculopathy, cervical region: Secondary | ICD-10-CM | POA: Insufficient documentation

## 2020-12-19 DIAGNOSIS — M6281 Muscle weakness (generalized): Secondary | ICD-10-CM | POA: Diagnosis present

## 2020-12-19 DIAGNOSIS — M21372 Foot drop, left foot: Secondary | ICD-10-CM | POA: Diagnosis present

## 2020-12-19 DIAGNOSIS — M21371 Foot drop, right foot: Secondary | ICD-10-CM | POA: Insufficient documentation

## 2020-12-19 NOTE — Patient Instructions (Signed)
   Access Code: [email or web address] URL: [email or web address] Date: 12/19/2020 Prepared by: Glenetta Hew  Exercises Seated Cervical Retraction - 2 x daily - 7 x weekly - 2 sets - 10 reps - 3 sec hold Seated Scapular Retraction - 3 x daily - 7 x weekly - 2 sets - 10 reps - 3 sec hold

## 2020-12-19 NOTE — Therapy (Signed)
[facility] [address]  [address] [place], [place], [address] Phone: [phone]   Fax:  [phone]  Physical Therapy Evaluation  Patient Details  Name: [name] Trujillo MRN: [id number] Date of Birth: [birth year] Referring Provider (PT): Rondel Oh, NP for [name], MD   Encounter Date: 12/19/2020   PT End of Session - 12/19/20 0920    Visit Number 1    Number of Visits 16    Date for PT Re-Evaluation 02/13/21    Authorization Type [employer]    PT Start Time 0920    PT Stop Time 1032    PT Time Calculation (min) 72 min    Activity Tolerance Patient tolerated treatment well;Other (comment)   Pt stumbled upon getting up to leave session, noting feeling of dizziness. Pt returned to sitting until dizziness cleared and then escorted to the car where her husband was waiting.   Behavior During Therapy [facility] for tasks assessed/performed           Past Medical History:  Diagnosis Date  . High cholesterol   . Hypothyroidism   . Neuropathy     Past Surgical History:  Procedure Laterality Date  . APPEN[age]CTOMY     28-18 yo  . BACK SURGERY     Age 37  . CESAREAN SECTION    . ECTOPIC PREGNANCY SURGERY    . LAPAROSCOPIC HYSTERECTOMY      There were no vitals filed for this visit.    Subjective Assessment - 12/19/20 0920    Subjective Pt reports increasing weakness and decreasing coordination in her hands starting last year around May - her dtr noticed this while on vacation at the beach and encouraged her to get it checked out. Ended up having C5-6-7 ACDF on 09/21/20. Was in cervical collar 24-7 x 6 weeks then gradually weaned. Still using the bone stimulator at night and occasionally during the day. Initially was told not to do any of her prior PT HEP but has now been released from any precautions. She feels that her balance and stability with walking and mobility has declined as a result. Still has numbness in R  thumb and impaired temp sensation as well as weaknes and clumsiness in R>L hands.    Pertinent History 09/21/20 - ACDF C5-6-7    Patient Stated Goals "to strengthen my neck muscles and improve th motion in my neck to help with my balance"    Currently in Pain? No/denies    Pain Score 0-No pain   up to 2-3/10   Pain Location Neck    Pain Orientation Lower    Pain Descriptors / Indicators Dull;Aching    Pain Type Chronic pain    Pain Onset More than a month ago    Pain Frequency Intermittent    Aggravating Factors  fatigue    Pain Relieving Factors Tylenol & rest (laying down)              [facility] PT Assessment - 12/19/20 0920      Assessment   Medical Diagnosis Cervical ACDF C5-6-7    Referring Provider (PT) Rondel Oh, NP for [name], MD    Onset Date/Surgical Date 09/21/20    Hand Dominance Right    Next MD Visit 04/11/21   01/16/21 with neurologist in same office   Prior Therapy PT in 2018 & 2020/2021 for abnormality of gait due to peripheral neuropathy & L foot drop      Precautions  Precautions None      Restrictions   Weight Bearing Restrictions No      Balance Screen   Has the patient fallen in the past 6 months No    Has the patient had a decrease in activity level because of a fear of falling?  Yes    Is the patient reluctant to leave their home because of a fear of falling?  Yes      Home Environment   Living Environment Private residence    Living Arrangements Spouse/significant other    Type of Home House    Home Access Stairs to enter    Entrance Stairs-Number of Steps 5-6    Entrance Stairs-Rails Left    Home Layout One level;Laundry or work area in [employer]    Home Equipment --   hiking poles - usually uses just 1     Prior Function   Level of Independence Independent    Vocation Retired    Leisure walking 2x./day for total of ~1.5 miles; working in the yard; Curatorsewing/quilting      Cognition   Overall Cognitive Status Within Functional Limits  for tasks assessed      Observation/Other Assessments   Focus on Therapeutic Outcomes (FOTO)  Neck: FS = 38, predicted D/C FS = 56      Sensation   Light Touch Impaired by gross assessment   numbness in R thumb   Hot/Cold Impaired by gross assessment   improving since surgery     Coordination   Gross Motor Movements are Fluid and Coordinated No    Fine Motor Movements are Fluid and Coordinated Yes    Heel Shin Test mild impairment      Posture/Postural Control   Posture/Postural Control Postural limitations    Postural Limitations Forward head;Rounded Shoulders   mild   Posture Comments scoliosis      ROM / Strength   AROM / PROM / Strength AROM;Strength      AROM   Overall AROM  Within functional limits for tasks performed   B shoulders   AROM Assessment Site Cervical;Shoulder    Cervical Flexion 27    Cervical Extension 43    Cervical - Right Side Bend 25    Cervical - Left Side Bend 20    Cervical - Right Rotation 40   mild discomfort   Cervical - Left Rotation 40      Strength   Strength Assessment Site Shoulder;Elbow;Hand;Hip;Knee;Ankle    Right/Left Shoulder Right;Left    Right Shoulder Flexion 4-/5    Right Shoulder ABduction 4/5    Right Shoulder Internal Rotation 4/5    Right Shoulder External Rotation 4-/5    Left Shoulder Flexion 4-/5    Left Shoulder ABduction 4/5    Left Shoulder Internal Rotation 4/5    Left Shoulder External Rotation 4-/5    Right/Left Elbow Right;Left    Right Elbow Flexion 4/5    Right Elbow Extension 3+/5    Left Elbow Flexion 4/5    Left Elbow Extension 3+/5    Right/Left hand Right;Left    Right Hand Gross Grasp Functional    Right Hand Grip (lbs) 24   26, 25, 21   Right Hand Lateral Pinch 10 lbs   10, 10, 10   Right Hand 3 Point Pinch 9.67 lbs   10, 10, 9   Left Hand Grip (lbs) 22   26, 20, 20   Left Hand Lateral Pinch 10 lbs   10, 10,  10   Left Hand 3 Point Pinch 9.33 lbs   10, 9, 9   Right Hip Flexion 4/5    Right Hip  External Rotation  3+/5    Right Hip Internal Rotation 4-/5    Left Hip Flexion 4/5    Left Hip External Rotation 3+/5    Left Hip Internal Rotation 4-/5    Right Knee Flexion 5/5    Right Knee Extension 5/5    Left Knee Flexion 4+/5    Left Knee Extension 5/5    Right Ankle Dorsiflexion 3/5    Right Ankle Plantar Flexion 3/5   unable to initiate single leg heel raise; 4/5 with manual resistance   Right Ankle Inversion 3+/5    Right Ankle Eversion 3+/5    Left Ankle Dorsiflexion 2+/5    Left Ankle Plantar Flexion 3/5   unable to initiate single leg heel raise; 3+/5 with manual resistance   Left Ankle Inversion 2-/5    Left Ankle Eversion 3/5      Flexibility   Hamstrings mild tightness B    Quadriceps mild hip flexor tightness R, mild/mod quad tightness on L    ITB mild tight on R    Piriformis mod tightness B      Ambulation/Gait   Assistive device Other (Comment)   single walking pole   Gait Pattern Step-through pattern;Decreased dorsiflexion - left;Decreased dorsiflexion - right;Poor foot clearance - left;Poor foot clearance - right;Lateral hip instability;Scissoring;Ataxic;Decreased stride length;Decreased step length - right;Decreased step length - left   B foot drop/slap (L>R)   Ambulation Surface Level;Indoor                      Objective measurements completed on examination: See above findings.       [facility] Adult PT Treatment/Exercise - 12/19/20 0920      Neck Exercises: Seated   Neck Retraction 10 reps;3 secs    Other Seated Exercise Scap retraction + depression 10 x 3"                  PT Education - 12/19/20 1032    Education Details PT eval findings; anticipated POC & initial HEP - Access code: [email or web address]            PT Short Term Goals - 12/19/20 1032      PT SHORT TERM GOAL #1   Title Patient will be independent with initial HEP    Status New    Target Date 01/09/21             PT Long Term Goals - 12/19/20 1032      PT  LONG TERM GOAL #1   Title Patient will be independent with ongoing/advanced HEP for self-management at home    Status New    Target Date 02/13/21      PT LONG TERM GOAL #2   Title Patient to improve cervical AROM to [facility] without pain provocation    Status New    Target Date 02/13/21      PT LONG TERM GOAL #3   Title Patient will demonstrate improved B shoulder strength to >/= 4 to 4+/5 for functional UE use    Status New    Target Date 02/13/21      PT LONG TERM GOAL #4   Title Patient will demonstrate improved B hip strength to >/= 4/5 and B ankle strength to >/= 3+/5 to 4-/5  for improved stability and ease of mobility  Status New    Target Date 02/13/21      PT LONG TERM GOAL #5   Title Patient will ambulate with improved gait pattern with decreased B foot drop/slap and no evidence of instability/LOB    Status New    Target Date 02/13/21                  Plan - 12/19/20 1032    Clinical Impression Statement Rashaun is a 80 y/o female who presents to OP ~3 months s/p C5-7 ACDF. She was treated in 2018 and 2020/2021 by this PT for multiple problems including abnormal gait with B foot drop/slap (L>R), peroneal neuropathy, LE weakness and gait instability with significant improvement noted in strength, balance and foot/ankle control by the end of the episodes. New deficits include decreased cervical ROM, impaired UE sensation and mild/mod UE weakness. Patient reports she had been keeping up with the most recent HEP from her last PT episode until her surgery but has not attempted any exercises in the past 3 months per the surgeon's post-surgical restrictions, hence she has noted worsening of LE flexibility as well as hip and ankle strength, increasingly unsteady gait and worsening balance. Gait pattern with single walking pole reveals ataxic gait with decreased B active DF as well as lack of eccentric DF control on weight acceptance resulting in foot slap (L>R), with increased hip  and knee flexion to compensate and allow for foot clearance. Impairments noted in static and dynamic standing balance along with gait instability - will plan for formal assessment next visit. Pt also noted to experience dizziness/lightheadedness upon positional changes leading to a significant LOB (no fall) upon getting up to leave the clinic, therefore will also plan for orthostatic hypotension assessment. Given prior positive response to PT, anticipate Karleigh will benefit from skilled PT for cervical ROM, postural ad UE strengthening along with LE strengthening, balance and coordination training, and dynamic gait training to improve safety with gait and mobility and reduce risk for falls.    Personal Factors and Comorbidities Time since onset of injury/illness/exacerbation;Past/Current Experience;Comorbidity 3+;Age    Comorbidities neuropathy; hypthyroidism; DDD; remote h/o back surgery at age 56; h/o scoliosis    Examination-Activity Limitations Bathing;Bend;Caring for Others;Carry;Dressing;Hygiene/Grooming;Lift;Locomotion Level;Squat;Stairs;Stand;Transfers    Examination-Participation Restrictions Cleaning;Community Activity;Driving;Laundry;Meal Prep;Shop;Yard Work    Stability/Clinical Decision Making Unstable/Unpredictable    Clinical Decision Making High    Rehab Potential Good    PT Frequency 2x / week    PT Duration 8 weeks    PT Treatment/Interventions ADLs/Self Care Home Management;Electrical Stimulation;Iontophoresis /ml Dexamethasone;Moist Heat;Ultrasound;Gait training;Stair training;Functional mobility training;Therapeutic activities;Therapeutic exercise;Balance training;Neuromuscular re-education;Patient/family education;Orthotic Fit/Training;Manual techniques;Passive range of motion;Dry needling;Taping;Joint Manipulations;Vestibular;Energy conservation    PT Next Visit Plan Assess for orthostatic hypotension; balance assessment; postural ROM/strengthening    PT Home Exercise Plan  MedBridge Access Code: [email or web address] (4/12)    Consulted and Agree with Plan of Care Patient           Patient will benefit from skilled therapeutic intervention in order to improve the following deficits and impairments:  Abnormal gait,Decreased activity tolerance,Decreased balance,Decreased coordination,Decreased endurance,Decreased knowledge of use of DME,Decreased mobility,Decreased range of motion,Decreased safety awareness,Decreased strength,Difficulty walking,Increased edema,Increased fascial restricitons,Increased muscle spasms,Impaired perceived functional ability,Impaired flexibility,Impaired sensation,Impaired UE functional use,Improper body mechanics,Postural dysfunction,Dizziness  Visit Diagnosis: Radiculopathy, cervical region  Muscle weakness (generalized)  Unsteadiness on feet  Other abnormalities of gait and mobility  Foot drop, left  Foot drop, right     Problem List Patient Active Problem List  Diagnosis Date Noted  . 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 Ventricular premature beats 10/31/2015  . Heart murmur 10/31/2015    Marry Guan, PT, MPT 12/19/2020, 4:23 PM  [facility] [address]  [address] [place], [place], [address] Phone: [phone]   Fax:  [phone]  Name: [name] Trujillo MRN: [id number] Date of Birth: 10/03/1940

## 2020-12-20 ENCOUNTER — Encounter (INDEPENDENT_AMBULATORY_CARE_PROVIDER_SITE_OTHER): Payer: Self-pay | Admitting: Neurological Surgery

## 2020-12-26 ENCOUNTER — Ambulatory Visit: Payer: Medicare PPO | Admitting: Physical Therapy

## 2020-12-26 ENCOUNTER — Encounter: Payer: Self-pay | Admitting: Physical Therapy

## 2020-12-26 ENCOUNTER — Other Ambulatory Visit: Payer: Self-pay

## 2020-12-26 VITALS — BP 144/60 | HR 104

## 2020-12-26 DIAGNOSIS — R2681 Unsteadiness on feet: Secondary | ICD-10-CM

## 2020-12-26 DIAGNOSIS — M5412 Radiculopathy, cervical region: Secondary | ICD-10-CM

## 2020-12-26 DIAGNOSIS — M21371 Foot drop, right foot: Secondary | ICD-10-CM

## 2020-12-26 DIAGNOSIS — M6281 Muscle weakness (generalized): Secondary | ICD-10-CM

## 2020-12-26 DIAGNOSIS — M21372 Foot drop, left foot: Secondary | ICD-10-CM

## 2020-12-26 DIAGNOSIS — R2689 Other abnormalities of gait and mobility: Secondary | ICD-10-CM

## 2020-12-26 NOTE — Therapy (Signed)
[facility] [address]  [address] [place], [place], [address] Phone: [phone]   Fax:  [phone]  Physical Therapy Treatment  Patient Details  Name: [name] Trujillo MRN: [id number] Date of Birth: June 07, 1941 Referring Provider (PT): Rondel Oh, NP for [name] Beath, MD   Encounter Date: 12/26/2020   PT End of Session - 12/26/20 1449    Visit Number 2    Number of Visits 16    Date for PT Re-Evaluation 02/13/21    Authorization Type [employer]    PT Start Time 1449    PT Stop Time 1535    PT Time Calculation (min) 46 min    Equipment Utilized During Treatment Gait belt    Activity Tolerance Patient tolerated treatment well    Behavior During Therapy [facility] for tasks assessed/performed           Past Medical History:  Diagnosis Date  . High cholesterol   . Hypothyroidism   . Neuropathy     Past Surgical History:  Procedure Laterality Date  . APPENDECTOMY     39-18 yo  . BACK SURGERY     Age 56  . CESAREAN SECTION    . ECTOPIC PREGNANCY SURGERY    . LAPAROSCOPIC HYSTERECTOMY      Vitals:   12/26/20 1459 12/26/20 1501 12/26/20 1503  BP: 138/66 140/64 (!) 144/60  Pulse: (!) 104 96 (!) 104  SpO2: 97% 96% 96%     Subjective Assessment - 12/26/20 1456    Subjective Pt requesting clarification of chin tucks with HEP. No further dizziness or LOB as was experienced at the end of the eval session - she thinks it may have been related to the movements necessary as part of the initial assessment.    Pertinent History 09/21/20 - ACDF C5-6-7    Patient Stated Goals "to strengthen my neck muscles and improve th motion in my neck to help with my balance"    Currently in Pain? No/denies              [facility] PT Assessment - 12/26/20 1449      Ambulation/Gait   Stairs Yes    Stairs Assistance 4: Min guard;5: Supervision    Stair Management Technique One rail Right;Alternating pattern;Forwards       Standardized Balance Assessment   Standardized Balance Assessment Berg Balance Test;Timed Up and Go Test;Five Times Sit to Stand    Five times sit to stand comments  22.81 sec   pt requiring 1-2 fwd steps to right her balance with each sit to stand     [employer] Test   Sit to Stand Able to stand without using hands and stabilize independently    Standing Unsupported Able to stand 2 minutes with supervision    Sitting with Back Unsupported but Feet Supported on Floor or Stool Able to sit safely and securely 2 minutes    Stand to Sit Sits safely with minimal use of hands    Transfers Able to transfer with verbal cueing and /or supervision    Standing Unsupported with Eyes Closed Able to stand 3 seconds    Standing Unsupported with Feet Together Able to place feet together independently and stand for 1 minute with supervision    From Standing, Reach Forward with Outstretched Arm Can reach forward >5 cm safely (2")    From Standing Position, Pick up Object from Floor Unable to try/needs assist to keep balance    From  Standing Position, Turn to Look Behind Over each Shoulder Needs assist to keep from losing balance and falling    Turn 360 Degrees Needs close supervision or verbal cueing    Standing Unsupported, Alternately Place Feet on Step/Stool Able to complete >2 steps/needs minimal assist    Standing Unsupported, One Foot in Front Needs help to step but can hold 15 seconds    Standing on One Leg Tries to lift leg/unable to hold 3 seconds but remains standing independently    Total Score 28    Berg comment: < 36 high risk for falls (close to 100%)   previously 52/56 at end of last PT episode     Timed Up and Go Test   Normal TUG (seconds) 15.25   w/o AD; 11.44 sec with hiking pole              Vestibular Assessment - 12/26/20 1449      Orthostatics   BP supine (x 5 minutes) 138/66    HR supine (x 5 minutes) 104    BP sitting 140/64    HR sitting 96    BP standing (after 1  minute) 144/60    HR standing (after 1 minute) 104    Orthostatics Comment no significant c/o of dizziness or lightheadedness with changes of position                    [facility] Adult PT Treatment/Exercise - 12/26/20 1449      Exercises   Exercises Neck      Neck Exercises: Seated   Neck Retraction 10 reps;3 secs    Other Seated Exercise Scap retraction + depression 10 x 3"                    PT Short Term Goals - 12/26/20 1535      PT SHORT TERM GOAL #1   Title Patient will be independent with initial HEP    Status On-going    Target Date 01/09/21             PT Long Term Goals - 12/26/20 1721      PT LONG TERM GOAL #1   Title Patient will be independent with ongoing/advanced HEP for self-management at home    Status On-going    Target Date 02/13/21      PT LONG TERM GOAL #2   Title Patient to improve cervical AROM to [facility] without pain provocation    Status On-going    Target Date 02/13/21      PT LONG TERM GOAL #3   Title Patient will demonstrate improved B shoulder strength to >/= 4 to 4+/5 for functional UE use    Status On-going    Target Date 02/13/21      PT LONG TERM GOAL #4   Title Patient will demonstrate improved B hip strength to >/= 4/5 and B ankle strength to >/= 3+/5 to 4-/5  for improved stability and ease of mobility    Status On-going    Target Date 02/13/21      PT LONG TERM GOAL #5   Title Patient will ambulate with improved gait pattern with decreased B foot drop/slap and no evidence of instability/LOB    Status On-going    Target Date 02/13/21      PT LONG TERM GOAL #6   Title Patient will improve Berg score to >/= 46/56 to improve safety and stability with ADLs in standing and reduce risk  for falls    Status New    Target Date 02/13/21                 Plan - 12/26/20 1508    Clinical Impression Statement Session initiated with orthostatic BP assessment based on dizziness and LOB with rising to leave PT at  the end of last session with testing negative for orthostatic BP changes and no significant dizziness or lightheadedness reported. Reviewed the initial HEP at pt's request, clarifying desired degree of motion with cervical retraction. Balance assessed highlighting more stationary activities with [name] Trujillo & 5xSTS as pt reporting most of her perceived balance issues are more with static activities. Berg score revealing a major decline since end of last PT episode with current score of 28/56 as compared to 52/56 at discharge from PT for last episode ending in March of 2021. 5xSTS time of 22.81 sec indicates a recurrent fall risk (>15 sec) with pt requiring 1-2 steps on each standing effort to right her balance. TUG time of 15.25 sec w/o AD also indicates a high fall risk (>13.5 sec) although reduced to 11.44 sec with use of walking pole. Pt notes she often has to rely on forward motion to right her balance and feels more confident when moving vs standing still. Given significant decline in balance from 1 year ago, will plan to incorporate balance and fall prevention into current POC.    Personal Factors and Comorbidities Time since onset of injury/illness/exacerbation;Past/Current Experience;Comorbidity 3+;Age    Comorbidities neuropathy; hypthyroidism; DDD; remote h/o back surgery at age 8; h/o scoliosis    Examination-Activity Limitations Bathing;Bend;Caring for Others;Carry;Dressing;Hygiene/Grooming;Lift;Locomotion Level;Squat;Stairs;Stand;Transfers    Examination-Participation Restrictions Cleaning;Community Activity;Driving;Laundry;Meal Prep;Shop;Yard Work    Rehab Potential Good    PT Frequency 2x / week    PT Duration 8 weeks    PT Treatment/Interventions ADLs/Self Care Home Management;Electrical Stimulation;Iontophoresis 4mg /ml Dexamethasone;Moist Heat;Ultrasound;Gait training;Stair training;Functional mobility training;Therapeutic activities;Therapeutic exercise;Balance training;Neuromuscular  re-education;Patient/family education;Orthotic Fit/Training;Manual techniques;Passive range of motion;Dry needling;Taping;Joint Manipulations;Vestibular;Energy conservation    PT Next Visit Plan postural ROM/strengthening; progress cervical ROM as tolerated; balance training & fall prevention    PT Home Exercise Plan MedBridge Access Code: ([email or web address])    Consulted and Agree with Plan of Care Patient           Patient will benefit from skilled therapeutic intervention in order to improve the following deficits and impairments:  Abnormal gait,Decreased activity tolerance,Decreased balance,Decreased coordination,Decreased endurance,Decreased knowledge of use of DME,Decreased mobility,Decreased range of motion,Decreased safety awareness,Decreased strength,Difficulty walking,Increased edema,Increased fascial restricitons,Increased muscle spasms,Impaired perceived functional ability,Impaired flexibility,Impaired sensation,Impaired UE functional use,Improper body mechanics,Postural dysfunction,Dizziness  Visit Diagnosis: Radiculopathy, cervical region  Muscle weakness (generalized)  Unsteadiness on feet  Other abnormalities of gait and mobility  Foot drop, left  Foot drop, right     Problem List Patient Active Problem List   Diagnosis Date Noted  . 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 Ventricular premature beats 10/31/2015  . Heart murmur 10/31/2015    11/02/2015, PT, MPT 12/26/2020, 5:46 PM  [facility] [address]  [address] [place], [place], [place] Phone: [phone]   Fax:  [phone]  Name: [name] Trujillo MRN: [id number] Date of Birth: Mar 13, 1941

## 2021-01-02 ENCOUNTER — Ambulatory Visit: Payer: Medicare PPO | Admitting: Physical Therapy

## 2021-01-04 ENCOUNTER — Ambulatory Visit: Payer: Medicare PPO | Admitting: Physical Therapy

## 2021-01-09 ENCOUNTER — Encounter: Payer: Medicare PPO | Admitting: Physical Therapy

## 2021-01-11 ENCOUNTER — Ambulatory Visit: Payer: Medicare PPO | Attending: Registered" | Admitting: Physical Therapy

## 2021-01-11 ENCOUNTER — Other Ambulatory Visit: Payer: Self-pay

## 2021-01-11 ENCOUNTER — Encounter: Payer: Self-pay | Admitting: Physical Therapy

## 2021-01-11 DIAGNOSIS — R2681 Unsteadiness on feet: Secondary | ICD-10-CM | POA: Insufficient documentation

## 2021-01-11 DIAGNOSIS — M5412 Radiculopathy, cervical region: Secondary | ICD-10-CM | POA: Diagnosis not present

## 2021-01-11 DIAGNOSIS — M21372 Foot drop, left foot: Secondary | ICD-10-CM | POA: Diagnosis present

## 2021-01-11 DIAGNOSIS — R2689 Other abnormalities of gait and mobility: Secondary | ICD-10-CM | POA: Insufficient documentation

## 2021-01-11 DIAGNOSIS — M21371 Foot drop, right foot: Secondary | ICD-10-CM | POA: Insufficient documentation

## 2021-01-11 DIAGNOSIS — M6281 Muscle weakness (generalized): Secondary | ICD-10-CM | POA: Diagnosis present

## 2021-01-11 NOTE — Patient Instructions (Signed)
   Access Code: [email or web address] URL: [email or web address] Date: 01/11/2021 Prepared by: Glenetta Hew  Exercises Seated Cervical Retraction - 2 x daily - 7 x weekly - 2 sets - 10 reps - 3 sec hold Seated Scapular Retraction - 3 x daily - 7 x weekly - 2 sets - 10 reps - 3 sec hold Gentle Levator Scapulae Stretch - 2 x daily - 7 x weekly - 2 sets - 1 reps - 30 sec hold Seated Gentle Upper Trapezius Stretch - 2 x daily - 7 x weekly - 2 sets - 1 reps - 30 sec hold Squat with Counter Support - 2 x daily - 3-4 x weekly - 2 sets - 10 reps - 1 hold Standing Single Leg Stance with Counter Support - 1 x daily - 3-4 x weekly - 2 sets - 1 reps - 30 seconds hold

## 2021-01-11 NOTE — Therapy (Addendum)
[facility] Outpatient Rehabilitation [facility] [address]  [address] [place], [place], [address] Phone: [phone]   Fax:  [phone]  Physical Therapy Treatment  Patient Details  Name: [name] Trujillo MRN: [id number] Date of Birth: August 23, 1941 Referring Provider (PT): Rondel Oh, NP for [name] Beath, MD   Encounter Date: 01/11/2021   PT End of Session - 01/11/21 1502    Visit Number 3    Number of Visits 16    Date for PT Re-Evaluation 02/13/21    Authorization Type [employer]    PT Start Time 1359    PT Stop Time 1444    PT Time Calculation (min) 45 min    Equipment Utilized During Treatment Gait belt    Activity Tolerance Patient tolerated treatment well    Behavior During Therapy [facility] for tasks assessed/performed           Past Medical History:  Diagnosis Date  . High cholesterol   . Hypothyroidism   . Neuropathy     Past Surgical History:  Procedure Laterality Date  . APPENDECTOMY     23-20 yo  . BACK SURGERY     Age 44  . CESAREAN SECTION    . ECTOPIC PREGNANCY SURGERY    . LAPAROSCOPIC HYSTERECTOMY      There were no vitals filed for this visit.   Subjective Assessment - 01/11/21 1400    Subjective Pt reports little to no pain and tylenol relieves it. Not much dizziness since last session. The exercises have been going fine, does want a review for those.    Pertinent History 09/21/20 - ACDF C5-6-7    Patient Stated Goals "to strengthen my neck muscles and improve th motion in my neck to help with my balance"    Currently in Pain? No/denies    Pain Score 0-No pain              [facility] PT Assessment - 01/11/21 1503      Assessment   Medical Diagnosis Cervical ACDF C5-6-7    Referring Provider (PT) Rondel Oh, NP for [name] Beath, MD    Onset Date/Surgical Date 09/21/20    Hand Dominance Right    Next MD Visit 04/11/21   01/16/21 with neurologist in same office   Prior Therapy PT in 2018 & 2020/2021 for abnormality of  gait due to peripheral neuropathy & L foot drop      Observation/Other Assessments   Focus on Therapeutic Outcomes (FOTO)  Neck: FS = 38, predicted D/C FS = 56      AROM   Overall AROM  Within functional limits for tasks performed   B shoulders   Cervical Flexion 27    Cervical Extension 43    Cervical - Right Side Bend 25    Cervical - Left Side Bend 20    Cervical - Right Rotation 40   mild discomfort   Cervical - Left Rotation 40      Strength   Right Shoulder Flexion 4-/5    Right Shoulder ABduction 4/5    Right Shoulder Internal Rotation 4/5    Right Shoulder External Rotation 4-/5    Left Shoulder Flexion 4-/5    Left Shoulder ABduction 4/5    Left Shoulder Internal Rotation 4/5    Left Shoulder External Rotation 4-/5    Right Elbow Flexion 4/5    Right Elbow Extension 3+/5    Left Elbow Flexion 4/5    Left Elbow Extension 3+/5  Right Hand Gross Grasp Functional    Right Hand Grip (lbs) 24   26, 25, 21   Left Hand Grip (lbs) 22   26, 20, 20   Right Hip Flexion 4/5    Right Hip External Rotation  3+/5    Right Hip Internal Rotation 4-/5    Left Hip Flexion 4/5    Left Hip External Rotation 3+/5    Left Hip Internal Rotation 4-/5    Right Knee Flexion 5/5    Right Knee Extension 5/5    Left Knee Flexion 4+/5    Left Knee Extension 5/5    Right Ankle Dorsiflexion 3/5    Right Ankle Plantar Flexion 3/5   unable to initiate single leg heel raise; 4/5 with manual resistance   Right Ankle Inversion 3+/5    Right Ankle Eversion 3+/5    Left Ankle Dorsiflexion 2+/5    Left Ankle Plantar Flexion 3/5   unable to initiate single leg heel raise; 3+/5 with manual resistance   Left Ankle Inversion 2-/5    Left Ankle Eversion 3/5      Standardized Balance Assessment   Standardized Balance Assessment Berg Balance Test;Timed Up and Go Test;Five Times Sit to Stand    Five times sit to stand comments  22.81 sec   pt requiring 1-2 fwd steps to right her balance with each sit to  stand     [employer]   Sit to Stand Able to stand without using hands and stabilize independently    Standing Unsupported Able to stand 2 minutes with supervision    Sitting with Back Unsupported but Feet Supported on Floor or Stool Able to sit safely and securely 2 minutes    Stand to Sit Sits safely with minimal use of hands    Transfers Able to transfer with verbal cueing and /or supervision    Standing Unsupported with Eyes Closed Able to stand 3 seconds    Standing Unsupported with Feet Together Able to place feet together independently and stand for 1 minute with supervision    From Standing, Reach Forward with Outstretched Arm Can reach forward >5 cm safely (2")    From Standing Position, Pick up Object from Floor Unable to try/needs assist to keep balance    From Standing Position, Turn to Look Behind Over each Shoulder Needs assist to keep from losing balance and falling    Turn 360 Degrees Needs close supervision or verbal cueing    Standing Unsupported, Alternately Place Feet on Step/Stool Able to complete >2 steps/needs minimal assist    Standing Unsupported, One Foot in Front Needs help to step but can hold 15 seconds    Standing on One Leg Tries to lift leg/unable to hold 3 seconds but remains standing independently    Total Score 28    Berg comment: < 36 high risk for falls (close to 100%)   previously 52/56 at end of last PT episode     Timed Up and Go Test   Normal TUG (seconds) 15.25   w/o AD; 11.44 sec with hiking pole                        [facility] Adult PT Treatment/Exercise - 01/11/21 1400      Exercises   Exercises Neck      Neck Exercises: Machines for Strengthening   UBE (Upper Arm Bike) L2 x 6      Neck Exercises: Seated  Neck Retraction 3 secs;20 reps    Other Seated Exercise Scap retraction + depression 10 x 3"      Knee/Hip Exercises: Standing   Other Standing Knee Exercises Squat with counter HHA   2 x 10     Manual Therapy    Manual Therapy Soft tissue mobilization    Soft tissue mobilization STM/DTM B UT      Neck Exercises: Stretches   Upper Trapezius Stretch 2 reps;Right;Left;30 seconds    Upper Trapezius Stretch Limitations gentle UT stretch    Levator Stretch 30 seconds;Right;Left;1 rep    Levator Stretch Limitations gentle LS stretch               Balance Exercises - 01/11/21 0001      Balance Exercises: Standing   Standing Eyes Opened Narrow base of support (BOS);Wide (BOA);Head turns;Foam/compliant surface;30 secs   1 x 30 seconds each; 1 x 10 head turns each up and down, left and right: HHA required when eyes closed and for head turns   Standing Eyes Closed Narrow base of support (BOS);Wide (BOA);Foam/compliant surface;1 rep;30 secs    Tandem Stance Eyes open;Eyes closed;Foam/compliant surface;Upper extremity support 2;1 rep;30 secs    SLS Eyes open;1 rep;30 secs;Eyes closed;Solid surface   Trendelenberg sign when standing on R leg; Eyes closed she requires 2 HHA   Tandem Gait Forward;Upper extremity support;2 reps   Down and back using treadmill as HHA   Sidestepping Upper extremity support;2 reps   Down and back using treadmill as HHA            PT Education - 01/11/21 1455    Education Details Updated current HEP: code [id number]    Person(s) Educated Patient    Methods Explanation;Demonstration;Tactile cues;Verbal cues;Handout    Comprehension Tactile cues required;Verbal cues required;Returned demonstration;Verbalized understanding            PT Short Term Goals - 01/11/21 1520      PT SHORT TERM GOAL #1   Title Patient will be independent with initial HEP    Status Partially Met   Reviewed chin tucks and scapular retractions to correct form 01/11/2021   Target Date 01/09/21             PT Long Term Goals - 01/11/21 1444      PT LONG TERM GOAL #1   Title Patient will be independent with ongoing/advanced HEP for self-management at home    Status On-going    Target Date  02/13/21      PT LONG TERM GOAL #2   Title Patient to improve cervical AROM to [facility] without pain provocation    Status On-going    Target Date 02/13/21      PT LONG TERM GOAL #3   Title Patient will demonstrate improved B shoulder strength to >/= 4 to 4+/5 for functional UE use    Status On-going    Target Date 02/13/21      PT LONG TERM GOAL #4   Title Patient will demonstrate improved B hip strength to >/= 4/5 and B ankle strength to >/= 3+/5 to 4-/5  for improved stability and ease of mobility    Status On-going    Target Date 02/13/21      PT LONG TERM GOAL #5   Title Patient will ambulate with improved gait pattern with decreased B foot drop/slap and no evidence of instability/LOB    Status On-going    Target Date 02/13/21      PT  LONG TERM GOAL #6   Title Patient will improve Berg score to >/= 46/56 to improve safety and stability with ADLs in standing and reduce risk for falls    Status On-going    Target Date 02/13/21                 Plan - 01/11/21 1455    Clinical Impression Statement [name] Trujillo reports little to no pain in her neck and she takes Tylenol as needed which is effective in easing any pain. She hasn't been experiencing any dizziness but focuses on her balance. She has a few questions that were clarified today regarding HEP. She presents with tightness in her B UT and LS and mild FHP. She requires HHA with SLS and eyes closed, along with balance exercises on [employer] including tandem stance eyes open and closed, and wide or narrow BOS with eyes closed. During R SLS she presents with Trendelenburg sign indicating weakness in her hip ABD. Assessment information collected from previous 2 visits is included in this note for MD reference. [name] Trujillo will continue to benefit from skilled PT to address the deficits listed above.    Personal Factors and Comorbidities Time since onset of injury/illness/exacerbation;Past/Current Experience;Comorbidity 3+;Age    Comorbidities  neuropathy; hypthyroidism; DDD; remote h/o back surgery at age 78; h/o scoliosis    Examination-Activity Limitations Bathing;Bend;Caring for Others;Carry;Dressing;Hygiene/Grooming;Lift;Locomotion Level;Squat;Stairs;Stand;Transfers    Examination-Participation Restrictions Cleaning;Community Activity;Driving;Laundry;Meal Prep;Shop;Yard Work    Rehab Potential Good    PT Frequency 2x / week    PT Duration 8 weeks    PT Treatment/Interventions ADLs/Self Care Home Management;Electrical Stimulation;Iontophoresis 4mg /ml Dexamethasone;Moist Heat;Ultrasound;Gait training;Stair training;Functional mobility training;Therapeutic activities;Therapeutic exercise;Balance training;Neuromuscular re-education;Patient/family education;Orthotic Fit/Training;Manual techniques;Passive range of motion;Dry needling;Taping;Joint Manipulations;Vestibular;Energy conservation    PT Next Visit Plan Proximal LE strengthening tareting hip ABD; Balance training & fall prevention; postural ROM/strengthening; progress cervical ROM as tolerated;    PT Home Exercise Plan MedBridge Access Code: [email or web address] (4/12 updated 5/5)    Consulted and Agree with Plan of Care Patient           Patient will benefit from skilled therapeutic intervention in order to improve the following deficits and impairments:  Abnormal gait,Decreased activity tolerance,Decreased balance,Decreased coordination,Decreased endurance,Decreased knowledge of use of DME,Decreased mobility,Decreased range of motion,Decreased safety awareness,Decreased strength,Difficulty walking,Increased edema,Increased fascial restricitons,Increased muscle spasms,Impaired perceived functional ability,Impaired flexibility,Impaired sensation,Impaired UE functional use,Improper body mechanics,Postural dysfunction,Dizziness  Visit Diagnosis: Radiculopathy, cervical region  Muscle weakness (generalized)  Unsteadiness on feet  Other abnormalities of gait and mobility  Foot drop,  left  Foot drop, right     Problem List Patient Active Problem List   Diagnosis Date Noted  . 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 Ventricular premature beats 10/31/2015  . Heart murmur 10/31/2015    Newman Nickels SPT 01/11/2021, 3:21 PM  [facility] [address]  [place], [place], [address] Phone: [phone]   Fax:  [phone]  Name: [name] Trujillo MRN: [id number] Date of Birth: Dec 25, 1940

## 2021-01-16 ENCOUNTER — Ambulatory Visit: Payer: Medicare PPO | Admitting: Physical Therapy

## 2021-01-16 ENCOUNTER — Other Ambulatory Visit: Payer: Self-pay

## 2021-01-16 ENCOUNTER — Encounter: Payer: Self-pay | Admitting: Physical Therapy

## 2021-01-16 ENCOUNTER — Ambulatory Visit (INDEPENDENT_AMBULATORY_CARE_PROVIDER_SITE_OTHER): Payer: Medicare PPO | Admitting: Neurology

## 2021-01-16 DIAGNOSIS — R2689 Other abnormalities of gait and mobility: Secondary | ICD-10-CM

## 2021-01-16 DIAGNOSIS — M6281 Muscle weakness (generalized): Secondary | ICD-10-CM

## 2021-01-16 DIAGNOSIS — M21371 Foot drop, right foot: Secondary | ICD-10-CM

## 2021-01-16 DIAGNOSIS — M5412 Radiculopathy, cervical region: Secondary | ICD-10-CM

## 2021-01-16 DIAGNOSIS — M21372 Foot drop, left foot: Secondary | ICD-10-CM

## 2021-01-16 DIAGNOSIS — R2681 Unsteadiness on feet: Secondary | ICD-10-CM

## 2021-01-16 NOTE — Therapy (Signed)
[facility] [address]  [place], [place], [address] Phone: [phone]   Fax:  [phone]  Physical Therapy Treatment  Patient Details  Name: [name] Trujillo MRN: [id number] Date of Birth: [birth year] Referring Provider (PT): Dorena Dew, NP for [name], MD   Encounter Date: 01/16/2021   PT End of Session - 01/16/21 1315    Visit Number 4    Number of Visits 16    Date for PT Re-Evaluation 02/13/21    Authorization Type [employer]    PT Start Time 8338    PT Stop Time 1401    PT Time Calculation (min) 46 min    Equipment Utilized During Treatment Gait belt    Activity Tolerance Patient tolerated treatment well    Behavior During Therapy [facility] for tasks assessed/performed           Past Medical History:  Diagnosis Date  . High cholesterol   . Hypothyroidism   . Neuropathy     Past Surgical History:  Procedure Laterality Date  . APPENDECTOMY     34-20 yo  . BACK SURGERY     Age 80  . CESAREAN SECTION    . ECTOPIC PREGNANCY SURGERY    . LAPAROSCOPIC HYSTERECTOMY      There were no vitals filed for this visit.   Subjective Assessment - 01/16/21 1321    Subjective Pt reports LE soreness and weakness for a few days following the squats last session.    Pertinent History 09/21/20 - ACDF C5-6-7    Patient Stated Goals "to strengthen my neck muscles and improve th motion in my neck to help with my balance"    Currently in Pain? No/denies                             [facility] Adult PT Treatment/Exercise - 01/16/21 1315      Exercises   Exercises Neck      Neck Exercises: Seated   Other Seated Exercise Sit <> stand focusing on fwd weight shift with upright neck & torso posture x 10      Neck Exercises: Prone   Neck Retraction 10 reps;5 secs    Neck Retraction Limitations standing leaning over green Pball on mat table with PT stabilizing ball      Knee/Hip Exercises:  Aerobic   Recumbent Bike L2 x 6 min      Neck Exercises: Stretches   Upper Trapezius Stretch Right;Left;2 reps;30 seconds    Upper Trapezius Stretch Limitations cues for lateral flexion with slight upward rotation of head while anchoring hand under hip    Levator Stretch Right;Left;2 reps;30 seconds    Levator Stretch Limitations cues for lateral flexion with slight downward rotation of head while anchoring hand under/behind hip      Ankle Exercises: Seated   Other Seated Ankle Exercises R 4-way ankle with red TB x 10 - cues for posture & scap engagement while stablizing theraband to reduce neck strain   Pt to perform for B ankles at home                 PT Education - 01/16/21 1400    Education Details HEP update (to existing Access Code: [email or web address]) - yellow TB resisted row/extension + scap retraction    Person(s) Educated Patient    Methods Explanation;Demonstration;Verbal cues;Handout    Comprehension Verbalized understanding;Verbal cues required;Returned demonstration;Need further instruction  PT Short Term Goals - 01/16/21 1338      PT SHORT TERM GOAL #1   Title Patient will be independent with initial HEP    Status Achieved   01/16/21            PT Long Term Goals - 01/11/21 1444      PT LONG TERM GOAL #1   Title Patient will be independent with ongoing/advanced HEP for self-management at home    Status On-going    Target Date 02/13/21      PT LONG TERM GOAL #2   Title Patient to improve cervical AROM to [facility] without pain provocation    Status On-going    Target Date 02/13/21      PT LONG TERM GOAL #3   Title Patient will demonstrate improved B shoulder strength to >/= 4 to 4+/5 for functional UE use    Status On-going    Target Date 02/13/21      PT LONG TERM GOAL #4   Title Patient will demonstrate improved B hip strength to >/= 4/5 and B ankle strength to >/= 3+/5 to 4-/5  for improved stability and ease of mobility    Status On-going     Target Date 02/13/21      PT LONG TERM GOAL #5   Title Patient will ambulate with improved gait pattern with decreased B foot drop/slap and no evidence of instability/LOB    Status On-going    Target Date 02/13/21      PT LONG TERM GOAL #6   Title Patient will improve Berg score to >/= 46/56 to improve safety and stability with ADLs in standing and reduce risk for falls    Status On-going    Target Date 02/13/21                 Plan - 01/16/21 1401    Clinical Impression Statement Magdalene reports better understanding of HEP scapular retraction and depression after cues to aim like she is tucking her shoulder blades in her back pockets but still questioning positioning for UT and LS stretches. Reviewed both with cues to initiate both stretches with lateral flexion/side bend the add slight upward tilt/rotation for UT stretch and downward tilt/rotation for LS stretch - pt better able to distinguish between the two stretches following review. No concerns with remaining initial HEP - STG met. Progressed postural strengthening with addition of yellow TB scap retraction rows/extension as well as gravity resisted cervical retraction with good tolerance - HEP updated for rows/retraction. Reinforced upright posture and head position during sit <> stand transitions as well as during brief review of prior HEP ankle TB strengthening exercises (new red TB provided at pt request as pt reporting her old red TB is "shot"). Henrietta will continue to benefit from further postural strengthening/core stabilization as well as balance training to reduce neck strain and improve stability.    Personal Factors and Comorbidities Time since onset of injury/illness/exacerbation;Past/Current Experience;Comorbidity 3+;Age    Comorbidities neuropathy; hypthyroidism; DDD; remote h/o back surgery at age 80; h/o scoliosis    Examination-Activity Limitations Bathing;Bend;Caring for  Others;Carry;Dressing;Hygiene/Grooming;Lift;Locomotion Level;Squat;Stairs;Stand;Transfers    Examination-Participation Restrictions Cleaning;Community Activity;Driving;Laundry;Meal Prep;Shop;Yard Work    Rehab Potential Good    PT Frequency 2x / week    PT Duration 8 weeks    PT Treatment/Interventions ADLs/Self Care Home Management;Electrical Stimulation;Iontophoresis 39m/ml Dexamethasone;Moist Heat;Ultrasound;Gait training;Stair training;Functional mobility training;Therapeutic activities;Therapeutic exercise;Balance training;Neuromuscular re-education;Patient/family education;Orthotic Fit/Training;Manual techniques;Passive range of motion;Dry needling;Taping;Joint Manipulations;Vestibular;Energy conservation    PT  Next Visit Plan Proximal LE strengthening tareting hip ABD; Balance training & fall prevention; postural ROM/strengthening; progress cervical ROM as tolerated    PT Home Exercise Plan MedBridge Access Code: [email or web address] (4/12, updated 5/5, 5/10)    Consulted and Agree with Plan of Care Patient           Patient will benefit from skilled therapeutic intervention in order to improve the following deficits and impairments:  Abnormal gait,Decreased activity tolerance,Decreased balance,Decreased coordination,Decreased endurance,Decreased knowledge of use of DME,Decreased mobility,Decreased range of motion,Decreased safety awareness,Decreased strength,Difficulty walking,Increased edema,Increased fascial restricitons,Increased muscle spasms,Impaired perceived functional ability,Impaired flexibility,Impaired sensation,Impaired UE functional use,Improper body mechanics,Postural dysfunction,Dizziness  Visit Diagnosis: Radiculopathy, cervical region  Muscle weakness (generalized)  Unsteadiness on feet  Other abnormalities of gait and mobility  Foot drop, left  Foot drop, right     Problem List Patient Active Problem List   Diagnosis Date Noted  . Idiopathic progressive neuropathy  04/06/2020  . Ventricular premature beats 10/31/2015  . Heart murmur 10/31/2015    Percival Spanish, PT, MPT 01/16/2021, 2:22 PM  [facility] [address]  [place], [place], [address] Phone: [phone]   Fax:  [phone]  Name: Nandika Stetzer MRN: [id number] Date of Birth: 23-Dec-1940

## 2021-01-16 NOTE — Patient Instructions (Signed)
   Access Code: [email or web address] URL: [email or web address] Date: 01/16/2021 Prepared by: Glenetta Hew  Exercises Seated Cervical Retraction - 2 x daily - 7 x weekly - 2 sets - 10 reps - 3 sec hold Seated Scapular Retraction - 3 x daily - 7 x weekly - 2 sets - 10 reps - 3 sec hold Gentle Levator Scapulae Stretch - 2 x daily - 7 x weekly - 2 sets - 1 reps - 30 sec hold Seated Gentle Upper Trapezius Stretch - 2 x daily - 7 x weekly - 2 sets - 1 reps - 30 sec hold Squat with Counter Support - 2 x daily - 3-4 x weekly - 2 sets - 10 reps - 1 hold Standing Single Leg Stance with Counter Support - 1 x daily - 3-4 x weekly - 2 sets - 1 reps - 30 seconds hold Seated Shoulder Row with Anchored Resistance - 1 x daily - 7 x weekly - 2 sets - 10 reps - 3-5 hold hold Seated Shoulder Extension and Scapular Retraction with Resistance - 1 x daily - 7 x weekly - 2 sets - 10 reps - 3-5 sec hold

## 2021-01-18 ENCOUNTER — Other Ambulatory Visit: Payer: Self-pay

## 2021-01-18 ENCOUNTER — Ambulatory Visit: Payer: Medicare PPO | Admitting: Physical Therapy

## 2021-01-18 ENCOUNTER — Encounter: Payer: Self-pay | Admitting: Physical Therapy

## 2021-01-18 DIAGNOSIS — M21372 Foot drop, left foot: Secondary | ICD-10-CM

## 2021-01-18 DIAGNOSIS — M21371 Foot drop, right foot: Secondary | ICD-10-CM

## 2021-01-18 DIAGNOSIS — M6281 Muscle weakness (generalized): Secondary | ICD-10-CM

## 2021-01-18 DIAGNOSIS — M5412 Radiculopathy, cervical region: Secondary | ICD-10-CM | POA: Diagnosis not present

## 2021-01-18 DIAGNOSIS — R2681 Unsteadiness on feet: Secondary | ICD-10-CM

## 2021-01-18 DIAGNOSIS — R2689 Other abnormalities of gait and mobility: Secondary | ICD-10-CM

## 2021-01-18 NOTE — Therapy (Signed)
[facility] Outpatient Rehabilitation [facility] [address]  [address] [place], [place], [address] Phone: [phone]   Fax:  [phone]  Physical Therapy Treatment  Patient Details  Name: [name] Trujillo MRN: [id number] Date of Birth: [birth year] Referring Provider (PT): Rondel Oh, NP for [name], MD   Encounter Date: 01/18/2021   PT End of Session - 01/18/21 1400    Visit Number 5    Number of Visits 16    Date for PT Re-Evaluation 02/13/21    Authorization Type [employer]    PT Start Time 1400    PT Stop Time 1448    PT Time Calculation (min) 48 min    Activity Tolerance Patient tolerated treatment well    Behavior During Therapy [facility] for tasks assessed/performed           Past Medical History:  Diagnosis Date  . High cholesterol   . Hypothyroidism   . Neuropathy     Past Surgical History:  Procedure Laterality Date  . APPENDECTOMY     78-20 yo  . BACK SURGERY     Age 42  . CESAREAN SECTION    . ECTOPIC PREGNANCY SURGERY    . LAPAROSCOPIC HYSTERECTOMY      There were no vitals filed for this visit.   Subjective Assessment - 01/18/21 1404    Subjective Pt reports the soreness from the squatting exercises is now fully resolved. She notes she was dizzy yesterday so she was unable to try the new HEP exercises but was able to do them this morning - she notes improved awareness of scapular activation with these exercises.    Pertinent History 09/21/20 - ACDF C5-6-7    Patient Stated Goals "to strengthen my neck muscles and improve th motion in my neck to help with my balance"                             [facility] Adult PT Treatment/Exercise - 01/18/21 1400      Ambulation/Gait   Ambulation/Gait Assistance 5: Supervision;4: Min guard    Ambulation/Gait Assistance Details cues for coordination of hiking pole to reduce lateral hip instability and Trendelenberg hip drop on L    Ambulation Distance (Feet) 180 Feet     Assistive device Other (Comment)   single walking pole   Gait Pattern Step-through pattern;Trendelenburg;Lateral hip instability;Decreased dorsiflexion - right;Decreased dorsiflexion - left;Poor foot clearance - left;Poor foot clearance - right;Ataxic      Neck Exercises: Theraband   Scapula Retraction 10 reps   Yellow TB - 2 sets   Scapula Retraction Limitations seated with cues for posture & cervical retraction; 2nd set seated on dynadisc with feet on Airex pad to increase core acitvation & balance    Rows 10 reps   Yellow TB - 2 sets   Rows Limitations seated with cues for posture & cervical retraction; 2nd set seated on dynadisc with feet on Airex pad to increase core acitvation & balance    Horizontal ABduction 10 reps   Yellow TB   Horizontal ABduction Limitations seated on dynadisc with feet on Airex pad -  cues for posture & cervical retraction    Other Theraband Exercises Alt yellow TB UE diagonals x 10; seated on dynadisc with feet on Airex pad -  cues for posture & cervical retraction      Knee/Hip Exercises: Aerobic   Recumbent Bike L3 x 6 min  Knee/Hip Exercises: Standing   SLS L/R SLS 3 x 15 sec - focusing on trying to maintain level pelvis with minimal UE support for balance    Other Standing Knee Exercises L hip hike standing with R foot on 2" step x 10    Other Standing Knee Exercises L hip fwd/back arc in R SLS x 10 (focusing on level pelvis)                    PT Short Term Goals - 01/16/21 1338      PT SHORT TERM GOAL #1   Title Patient will be independent with initial HEP    Status Achieved   01/16/21            PT Long Term Goals - 01/11/21 1444      PT LONG TERM GOAL #1   Title Patient will be independent with ongoing/advanced HEP for self-management at home    Status On-going    Target Date 02/13/21      PT LONG TERM GOAL #2   Title Patient to improve cervical AROM to [facility] without pain provocation    Status On-going    Target Date  02/13/21      PT LONG TERM GOAL #3   Title Patient will demonstrate improved B shoulder strength to >/= 4 to 4+/5 for functional UE use    Status On-going    Target Date 02/13/21      PT LONG TERM GOAL #4   Title Patient will demonstrate improved B hip strength to >/= 4/5 and B ankle strength to >/= 3+/5 to 4-/5  for improved stability and ease of mobility    Status On-going    Target Date 02/13/21      PT LONG TERM GOAL #5   Title Patient will ambulate with improved gait pattern with decreased B foot drop/slap and no evidence of instability/LOB    Status On-going    Target Date 02/13/21      PT LONG TERM GOAL #6   Title Patient will improve Berg score to >/= 46/56 to improve safety and stability with ADLs in standing and reduce risk for falls    Status On-going    Target Date 02/13/21                 Plan - 01/18/21 1408    Clinical Impression Statement [name] Trujillo reports LE muscle soreness from overdoing the squats has resolved but limited with HEP performance yesterday due to dizziness, now resolved today. She requested review of yellow TB resisted scap retraction + extension, noting she was feeling it more in her shoulders than her scapular muscles - she was able to perform exercise appropriately today and feels that she have secure band anchor too high at home. Progressed postural strengthening with addition of horizontal ABD and UE diagonals to scap retraction exercises as well as increased core activation/balance training with introduction of unstable seating surface using dynadisc and Airex pad. Addressed Trendelenburg hip drop with strengthening for glute medius and worked on gait modifications to control Trendelenburg but pt will require additional training in this.    Personal Factors and Comorbidities Time since onset of injury/illness/exacerbation;Past/Current Experience;Comorbidity 3+;Age    Comorbidities neuropathy; hypthyroidism; DDD; remote h/o back surgery at age 4;  h/o scoliosis    Examination-Activity Limitations Bathing;Bend;Caring for Others;Carry;Dressing;Hygiene/Grooming;Lift;Locomotion Level;Squat;Stairs;Stand;Transfers    Examination-Participation Restrictions Cleaning;Community Activity;Driving;Laundry;Meal Prep;Shop;Yard Work    Rehab Potential Good    PT Frequency 2x / week  PT Duration 8 weeks    PT Treatment/Interventions ADLs/Self Care Home Management;Electrical Stimulation;Iontophoresis 4mg /ml Dexamethasone;Moist Heat;Ultrasound;Gait training;Stair training;Functional mobility training;Therapeutic activities;Therapeutic exercise;Balance training;Neuromuscular re-education;Patient/family education;Orthotic Fit/Training;Manual techniques;Passive range of motion;Dry needling;Taping;Joint Manipulations;Vestibular;Energy conservation    PT Next Visit Plan Proximal LE strengthening targeting hip ABD; Balance training & fall prevention; postural ROM/strengthening; progress cervical ROM as tolerated    PT Home Exercise Plan MedBridge Access Code: (4/12, updated 5/5, 5/10)    Consulted and Agree with Plan of Care Patient           Patient will benefit from skilled therapeutic intervention in order to improve the following deficits and impairments:  Abnormal gait,Decreased activity tolerance,Decreased balance,Decreased coordination,Decreased endurance,Decreased knowledge of use of DME,Decreased mobility,Decreased range of motion,Decreased safety awareness,Decreased strength,Difficulty walking,Increased edema,Increased fascial restricitons,Increased muscle spasms,Impaired perceived functional ability,Impaired flexibility,Impaired sensation,Impaired UE functional use,Improper body mechanics,Postural dysfunction,Dizziness  Visit Diagnosis: Radiculopathy, cervical region  Muscle weakness (generalized)  Unsteadiness on feet  Other abnormalities of gait and mobility  Foot drop, left  Foot drop, right     Problem List Patient Active  Problem List   Diagnosis Date Noted  . 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 Ventricular premature beats 10/31/2015  . Heart murmur 10/31/2015    11/02/2015, PT, MPT 01/18/2021, 6:45 PM  [facility] [address]  [address] [place], [place], [place] Phone: [phone]   Fax:  [phone]  Name: [name] Trujillo MRN: [id number] Date of Birth: 14-Jun-1941

## 2021-01-23 ENCOUNTER — Other Ambulatory Visit: Payer: Self-pay

## 2021-01-23 ENCOUNTER — Encounter: Payer: Self-pay | Admitting: Physical Therapy

## 2021-01-23 ENCOUNTER — Ambulatory Visit: Payer: Medicare PPO | Admitting: Physical Therapy

## 2021-01-23 DIAGNOSIS — M6281 Muscle weakness (generalized): Secondary | ICD-10-CM

## 2021-01-23 DIAGNOSIS — R2689 Other abnormalities of gait and mobility: Secondary | ICD-10-CM

## 2021-01-23 DIAGNOSIS — R2681 Unsteadiness on feet: Secondary | ICD-10-CM

## 2021-01-23 DIAGNOSIS — M5412 Radiculopathy, cervical region: Secondary | ICD-10-CM | POA: Diagnosis not present

## 2021-01-23 DIAGNOSIS — M21371 Foot drop, right foot: Secondary | ICD-10-CM

## 2021-01-23 DIAGNOSIS — M21372 Foot drop, left foot: Secondary | ICD-10-CM

## 2021-01-23 NOTE — Therapy (Signed)
[facility] Outpatient Rehabilitation [facility] [address]  [address] [place], [place], [address] Phone: [phone]   Fax:  [phone]  Physical Therapy Treatment  Patient Details  Name: [name] Trujillo MRN: [id number] Date of Birth: 02-Mar-1941 Referring Provider (PT): Rondel Oh, NP for [name], MD   Encounter Date: 01/23/2021   PT End of Session - 01/23/21 1317    Visit Number 6    Number of Visits 16    Date for PT Re-Evaluation 02/13/21    Authorization Type [employer]    PT Start Time 1317    PT Stop Time 1358    PT Time Calculation (min) 41 min    Activity Tolerance Patient tolerated treatment well    Behavior During Therapy [facility] for tasks assessed/performed           Past Medical History:  Diagnosis Date  . High cholesterol   . Hypothyroidism   . Neuropathy     Past Surgical History:  Procedure Laterality Date  . APPENDECTOMY     93-20 yo  . BACK SURGERY     Age 66  . CESAREAN SECTION    . ECTOPIC PREGNANCY SURGERY    . LAPAROSCOPIC HYSTERECTOMY      There were no vitals filed for this visit.   Subjective Assessment - 01/23/21 1323    Subjective Pt notes she still has to turn her body when trying look at someone seated next to her.    Pertinent History 09/21/20 - ACDF C5-6-7    Patient Stated Goals "to strengthen my neck muscles and improve th motion in my neck to help with my balance"    Currently in Pain? No/denies                             [facility] Adult PT Treatment/Exercise - 01/23/21 1317      Neck Exercises: Machines for Strengthening   UBE (Upper Arm Bike) L2.5 x 6 min (3' fwd/3' back)      Neck Exercises: Theraband   Shoulder External Rotation 10 reps   yellow TB   Shoulder External Rotation Limitations seated with cues for posture & cervical retraction      Neck Exercises: Seated   Cervical Rotation Right;Left;10 reps    Cervical Rotation Limitations cues for chin tuck/retraction     Money 10 reps;3 secs    Money Limitations seated with cues for posture & cervical retraction    Upper Extremity D2 Flexion;10 reps;Theraband    Theraband Level (UE D2) Level 1 (Yellow)    UE D2 Limitations head following hand                    PT Short Term Goals - 01/16/21 1338      PT SHORT TERM GOAL #1   Title Patient will be independent with initial HEP    Status Achieved   01/16/21            PT Long Term Goals - 01/11/21 1444      PT LONG TERM GOAL #1   Title Patient will be independent with ongoing/advanced HEP for self-management at home    Status On-going    Target Date 02/13/21      PT LONG TERM GOAL #2   Title Patient to improve cervical AROM to [facility] without pain provocation    Status On-going    Target Date 02/13/21  PT LONG TERM GOAL #3   Title Patient will demonstrate improved B shoulder strength to >/= 4 to 4+/5 for functional UE use    Status On-going    Target Date 02/13/21      PT LONG TERM GOAL #4   Title Patient will demonstrate improved B hip strength to >/= 4/5 and B ankle strength to >/= 3+/5 to 4-/5  for improved stability and ease of mobility    Status On-going    Target Date 02/13/21      PT LONG TERM GOAL #5   Title Patient will ambulate with improved gait pattern with decreased B foot drop/slap and no evidence of instability/LOB    Status On-going    Target Date 02/13/21      PT LONG TERM GOAL #6   Title Patient will improve Berg score to >/= 46/56 to improve safety and stability with ADLs in standing and reduce risk for falls    Status On-going    Target Date 02/13/21                 Plan - 01/23/21 1325    Clinical Impression Statement Odalis reports improving posture seems to also be helping with her balance, noting improved stability in the shower in particular. She still notes limitation in cervical rotation such that she still has to turn her body to talk to someone sitting next to her. Continued postural  strengthening/awareness while progressing cervical motion emphasizing rotational movements with good tolerance - HEP updated accordingly. Charlye will continue to benefit from skilled PT to restore functional cervical rotation, improve postural stability and balance and increased safety with mobility/ambulation.    Personal Factors and Comorbidities Time since onset of injury/illness/exacerbation;Past/Current Experience;Comorbidity 3+;Age    Comorbidities neuropathy; hypthyroidism; DDD; remote h/o back surgery at age 39; h/o scoliosis    Examination-Activity Limitations Bathing;Bend;Caring for Others;Carry;Dressing;Hygiene/Grooming;Lift;Locomotion Level;Squat;Stairs;Stand;Transfers    Examination-Participation Restrictions Cleaning;Community Activity;Driving;Laundry;Meal Prep;Shop;Yard Work    Rehab Potential Good    PT Frequency 2x / week    PT Duration 8 weeks    PT Treatment/Interventions ADLs/Self Care Home Management;Electrical Stimulation;Iontophoresis 4mg /ml Dexamethasone;Moist Heat;Ultrasound;Gait training;Stair training;Functional mobility training;Therapeutic activities;Therapeutic exercise;Balance training;Neuromuscular re-education;Patient/family education;Orthotic Fit/Training;Manual techniques;Passive range of motion;Dry needling;Taping;Joint Manipulations;Vestibular;Energy conservation    PT Next Visit Plan Postural ROM/strengthening; Progress cervical ROM as tolerated; Proximal LE strengthening targeting hip ABD; Balance training & fall prevention    PT Home Exercise Plan MedBridge Access Code: (4/12, updated 5/5, 5/10, 5/17)    Consulted and Agree with Plan of Care Patient           Patient will benefit from skilled therapeutic intervention in order to improve the following deficits and impairments:  Abnormal gait,Decreased activity tolerance,Decreased balance,Decreased coordination,Decreased endurance,Decreased knowledge of use of DME,Decreased mobility,Decreased range of  motion,Decreased safety awareness,Decreased strength,Difficulty walking,Increased edema,Increased fascial restricitons,Increased muscle spasms,Impaired perceived functional ability,Impaired flexibility,Impaired sensation,Impaired UE functional use,Improper body mechanics,Postural dysfunction,Dizziness  Visit Diagnosis: Radiculopathy, cervical region  Muscle weakness (generalized)  Unsteadiness on feet  Other abnormalities of gait and mobility  Foot drop, left  Foot drop, right     Problem List Patient Active Problem List   Diagnosis Date Noted  . 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 Ventricular premature beats 10/31/2015  . Heart murmur 10/31/2015    11/02/2015, PT, MPT 01/23/2021, 8:02 PM  [facility] [address]  [address] [place], [place], [place] Phone: [phone]   Fax:  [phone]  Name: Elody  Laughner MRN: [id number] Date of Birth: September 21, 1940

## 2021-01-23 NOTE — Patient Instructions (Addendum)
   Access Code: [email or web address] URL: [email or web address] Date: 01/23/2021 Prepared by: Glenetta Hew  Exercises Seated Cervical Retraction - 2 x daily - 7 x weekly - 2 sets - 10 reps - 3 sec hold Seated Scapular Retraction - 3 x daily - 7 x weekly - 2 sets - 10 reps - 3 sec hold Gentle Levator Scapulae Stretch - 2 x daily - 7 x weekly - 2 sets - 1 reps - 30 sec hold Seated Gentle Upper Trapezius Stretch - 2 x daily - 7 x weekly - 2 sets - 1 reps - 30 sec hold Squat with Counter Support - 2 x daily - 3-4 x weekly - 2 sets - 10 reps - 1 hold Standing Single Leg Stance with Counter Support - 1 x daily - 3-4 x weekly - 2 sets - 1 reps - 30 seconds hold Seated Shoulder Row with Anchored Resistance - 1 x daily - 7 x weekly - 2 sets - 10 reps - 3-5 hold hold Seated Shoulder Extension and Scapular Retraction with Resistance - 1 x daily - 7 x weekly - 2 sets - 10 reps - 3-5 sec hold Seated Cervical Retraction and Rotation - 1 x daily - 7 x weekly - 2 sets - 10 reps - 3 sec hold Seated Bilateral Shoulder External Rotation with Resistance - 1 x daily - 7 x weekly - 2 sets - 10 reps - 3 sec hold Seated Single Shoulder PNF D2 with Resistance - 1 x daily - 7 x weekly - 2 sets - 10 reps - 3 sec hold

## 2021-01-25 ENCOUNTER — Encounter: Payer: Self-pay | Admitting: Physical Therapy

## 2021-01-25 ENCOUNTER — Other Ambulatory Visit: Payer: Self-pay

## 2021-01-25 ENCOUNTER — Ambulatory Visit: Payer: Medicare PPO | Admitting: Physical Therapy

## 2021-01-25 DIAGNOSIS — R2689 Other abnormalities of gait and mobility: Secondary | ICD-10-CM

## 2021-01-25 DIAGNOSIS — M21371 Foot drop, right foot: Secondary | ICD-10-CM

## 2021-01-25 DIAGNOSIS — M6281 Muscle weakness (generalized): Secondary | ICD-10-CM

## 2021-01-25 DIAGNOSIS — R2681 Unsteadiness on feet: Secondary | ICD-10-CM

## 2021-01-25 DIAGNOSIS — M21372 Foot drop, left foot: Secondary | ICD-10-CM

## 2021-01-25 DIAGNOSIS — M5412 Radiculopathy, cervical region: Secondary | ICD-10-CM | POA: Diagnosis not present

## 2021-01-25 NOTE — Therapy (Signed)
[facility] [address]  [address] [place], [place], [address] Phone: [phone]   Fax:  [phone]  Physical Therapy Treatment  Patient Details  Name: [name] Trujillo MRN: [id number] Date of Birth: 01-May-1941 Referring Provider (PT): Rondel Oh, NP for [name], MD   Encounter Date: 01/25/2021   PT End of Session - 01/25/21 1401    Visit Number 7    Number of Visits 16    Date for PT Re-Evaluation 02/13/21    Authorization Type [employer]    PT Start Time 1401    PT Stop Time 1444    PT Time Calculation (min) 43 min    Activity Tolerance Patient tolerated treatment well    Behavior During Therapy [facility] for tasks assessed/performed           Past Medical History:  Diagnosis Date  . High cholesterol   . Hypothyroidism   . Neuropathy     Past Surgical History:  Procedure Laterality Date  . APPEN[age]CTOMY     98-20 yo  . BACK SURGERY     Age 73  . CESAREAN SECTION    . ECTOPIC PREGNANCY SURGERY    . LAPAROSCOPIC HYSTERECTOMY      There were no vitals filed for this visit.   Subjective Assessment - 01/25/21 1406    Subjective Pt noting she has some questions with the latest HEP update.    Pertinent History 09/21/20 - ACDF C5-6-7    Patient Stated Goals "to strengthen my neck muscles and improve th motion in my neck to help with my balance"    Currently in Pain? No/denies                             [facility] Adult PT Treatment/Exercise - 01/25/21 1401      Exercises   Exercises Neck      Neck Exercises: Machines for Strengthening   UBE (Upper Arm Bike) L2.5 x 6 min (3' fwd/3' back)      Neck Exercises: Theraband   Scapula Retraction 10 reps;Red    Scapula Retraction Limitations seated    Shoulder Extension 15 reps   yellow TB   Shoulder Extension Limitations + scap retraction in standing   SBA of PT for balance   Rows 10 reps;Red   2 sets   Rows Limitations seated &  standing   SBA of PT for balance in standing     Knee/Hip Exercises: Standing   Other Standing Knee Exercises L/R hip hike standing on 4" step x 10    Other Standing Knee Exercises L/R hip fwd/back arc in SLS x 10 (focusing on level pelvis with slight hip hike)                    PT Short Term Goals - 01/16/21 1338      PT SHORT TERM GOAL #1   Title Patient will be independent with initial HEP    Status Achieved   01/16/21            PT Long Term Goals - 01/11/21 1444      PT LONG TERM GOAL #1   Title Patient will be independent with ongoing/advanced HEP for self-management at home    Status On-going    Target Date 02/13/21      PT LONG TERM GOAL #2   Title Patient to improve cervical AROM to [facility]  without pain provocation    Status On-going    Target Date 02/13/21      PT LONG TERM GOAL #3   Title Patient will demonstrate improved B shoulder strength to >/= 4 to 4+/5 for functional UE use    Status On-going    Target Date 02/13/21      PT LONG TERM GOAL #4   Title Patient will demonstrate improved B hip strength to >/= 4/5 and B ankle strength to >/= 3+/5 to 4-/5  for improved stability and ease of mobility    Status On-going    Target Date 02/13/21      PT LONG TERM GOAL #5   Title Patient will ambulate with improved gait pattern with decreased B foot drop/slap and no evidence of instability/LOB    Status On-going    Target Date 02/13/21      PT LONG TERM GOAL #6   Title Patient will improve Berg score to >/= 46/56 to improve safety and stability with ADLs in standing and reduce risk for falls    Status On-going    Target Date 02/13/21                 Plan - 01/25/21 1444    Clinical Impression Statement [name] Trujillo reports improved ability to turn her head following exercises last session but notes issues with latest HEP update, in particular that she is getting irritation in the web space between her thumbs and index fingers from trying to grip the  theraband tight enough. Modified band adding hand grips and demonstrating how to shorten band as needed w/o having to wrap the band around her hands - better tolerance noted with no hand irritation. Advanced theraband resistance to red TB with seated rows/retractions and introduced standing row/retraction with SBA of PT for balance to increase core muscle activation and balance reactions - pt able to perform standing rows w/o LOB and noting increased muscle activation with performance. Session concluded with further attention to glute medius weakness and Trendelenburg hip drop exercises targeting level pelvic control in standing.    Personal Factors and Comorbidities Time since onset of injury/illness/exacerbation;Past/Current Experience;Comorbidity 3+;Age    Comorbidities neuropathy; hypthyroidism; DDD; remote h/o back surgery at age 38; h/o scoliosis    Examination-Activity Limitations Bathing;Bend;Caring for Others;Carry;Dressing;Hygiene/Grooming;Lift;Locomotion Level;Squat;Stairs;Stand;Transfers    Examination-Participation Restrictions Cleaning;Community Activity;Driving;Laundry;Meal Prep;Shop;Yard Work    Rehab Potential Good    PT Frequency 2x / week    PT Duration 8 weeks    PT Treatment/Interventions ADLs/Self Care Home Management;Electrical Stimulation;Iontophoresis 4mg /ml Dexamethasone;Moist Heat;Ultrasound;Gait training;Stair training;Functional mobility training;Therapeutic activities;Therapeutic exercise;Balance training;Neuromuscular re-education;Patient/family education;Orthotic Fit/Training;Manual techniques;Passive range of motion;Dry needling;Taping;Joint Manipulations;Vestibular;Energy conservation    PT Next Visit Plan Postural ROM/strengthening; Progress cervical ROM as tolerated; Proximal LE strengthening targeting hip ABD; Balance training & fall prevention    PT Home Exercise Plan MedBridge Access Code: (4/12, updated 5/5, 5/10, 5/17)    Consulted and Agree with Plan of  Care Patient           Patient will benefit from skilled therapeutic intervention in order to improve the following deficits and impairments:  Abnormal gait,Decreased activity tolerance,Decreased balance,Decreased coordination,Decreased endurance,Decreased knowledge of use of DME,Decreased mobility,Decreased range of motion,Decreased safety awareness,Decreased strength,Difficulty walking,Increased edema,Increased fascial restricitons,Increased muscle spasms,Impaired perceived functional ability,Impaired flexibility,Impaired sensation,Impaired UE functional use,Improper body mechanics,Postural dysfunction,Dizziness  Visit Diagnosis: Radiculopathy, cervical region  Muscle weakness (generalized)  Unsteadiness on feet  Other abnormalities of gait and mobility  Foot drop, left  Foot drop, right  Problem List Patient Active Problem List   Diagnosis Date Noted  . 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 Ventricular premature beats 10/31/2015  . Heart murmur 10/31/2015    Marry Guan, PT, MPT 01/25/2021, 3:59 PM  [facility] [address]  [address] [place], [place], [address] Phone: [phone]   Fax:  [phone]  Name: [name] Trujillo MRN: [id number] Date of Birth: [birth year]

## 2021-01-30 ENCOUNTER — Ambulatory Visit: Payer: Medicare PPO | Admitting: Physical Therapy

## 2021-01-30 ENCOUNTER — Encounter: Payer: Self-pay | Admitting: Physical Therapy

## 2021-01-30 ENCOUNTER — Other Ambulatory Visit: Payer: Self-pay

## 2021-01-30 DIAGNOSIS — M5412 Radiculopathy, cervical region: Secondary | ICD-10-CM

## 2021-01-30 DIAGNOSIS — M21372 Foot drop, left foot: Secondary | ICD-10-CM

## 2021-01-30 DIAGNOSIS — M6281 Muscle weakness (generalized): Secondary | ICD-10-CM

## 2021-01-30 DIAGNOSIS — R2689 Other abnormalities of gait and mobility: Secondary | ICD-10-CM

## 2021-01-30 DIAGNOSIS — R2681 Unsteadiness on feet: Secondary | ICD-10-CM

## 2021-01-30 DIAGNOSIS — M21371 Foot drop, right foot: Secondary | ICD-10-CM

## 2021-01-30 NOTE — Therapy (Signed)
[name] [address]  [address] [place], [place], [address] Phone: [phone]   Fax:  [phone]  Physical Therapy Treatment  Patient Details  Name: [name] Trujillo MRN: [id number] Date of Birth: 21-Mar-1941 Referring Provider (PT): [name] Oh, NP for [name] Beath, MD   Encounter Date: 01/30/2021   PT End of Session - 01/30/21 1315    Visit Number 8    Number of Visits 16    Date for PT Re-Evaluation 02/13/21    Authorization Type [employer]    PT Start Time 1315    PT Stop Time 1354    PT Time Calculation (min) 39 min    Activity Tolerance Patient tolerated treatment well    Behavior During Therapy [facility] for tasks assessed/performed           Past Medical History:  Diagnosis Date  . High cholesterol   . Hypothyroidism   . Neuropathy     Past Surgical History:  Procedure Laterality Date  . APPENDECTOMY     42-20 yo  . BACK SURGERY     Age 12  . CESAREAN SECTION    . ECTOPIC PREGNANCY SURGERY    . LAPAROSCOPIC HYSTERECTOMY      There were no vitals filed for this visit.   Subjective Assessment - 01/30/21 1320    Subjective Pt reports she has tried the standing rows at home with her husband spotting her - no LOB and feels that it is really helping. She does note some question/concern regarding the diagonals with the band.    Pertinent History 09/21/20 - ACDF C5-6-7    Patient Stated Goals "to strengthen my neck muscles and improve th motion in my neck to help with my balance"    Currently in Pain? No/denies                             [facility] Adult PT Treatment/Exercise - 01/30/21 1315      Neck Exercises: Machines for Strengthening   UBE (Upper Arm Bike) L3.0 x 6 min (3' fwd/3' back)      Neck Exercises: Seated   Upper Extremity D1 15 reps;Extension;Flexion;Theraband    Theraband Level (UE D1) Level 2 (Red)   band anchored in door   UE D1 Limitations head following hand     Upper Extremity D2 15 reps;Flexion;Extension;Theraband    Theraband Level (UE D2) Level 2 (Red)   band anchored in door   UE D2 Limitations head following hand                  PT Education - 01/30/21 1354    Education Details HEP update (to existing Access Code: [email or web address]) - D1/D2 UE diagonals in sitting    Person(s) Educated Patient    Methods Explanation;Demonstration;Verbal cues;Handout    Comprehension Verbalized understanding;Verbal cues required;Returned demonstration            PT Short Term Goals - 01/16/21 1338      PT SHORT TERM GOAL #1   Title Patient will be independent with initial HEP    Status Achieved   01/16/21            PT Long Term Goals - 01/11/21 1444      PT LONG TERM GOAL #1   Title Patient will be independent with ongoing/advanced HEP for self-management at home    Status On-going    Target Date 02/13/21  PT LONG TERM GOAL #2   Title Patient to improve cervical AROM to [facility] without pain provocation    Status On-going    Target Date 02/13/21      PT LONG TERM GOAL #3   Title Patient will demonstrate improved B shoulder strength to >/= 4 to 4+/5 for functional UE use    Status On-going    Target Date 02/13/21      PT LONG TERM GOAL #4   Title Patient will demonstrate improved B hip strength to >/= 4/5 and B ankle strength to >/= 3+/5 to 4-/5  for improved stability and ease of mobility    Status On-going    Target Date 02/13/21      PT LONG TERM GOAL #5   Title Patient will ambulate with improved gait pattern with decreased B foot drop/slap and no evidence of instability/LOB    Status On-going    Target Date 02/13/21      PT LONG TERM GOAL #6   Title Patient will improve Berg score to >/= 46/56 to improve safety and stability with ADLs in standing and reduce risk for falls    Status On-going    Target Date 02/13/21                 Plan - 01/30/21 1354    Clinical Impression Statement [name] Trujillo reports she has been able  to self-progress to the red TB with the standing rows and retraction with her husband spotting her with no LOB. She has also progressed the seated D2 flexion diagonal to the red TB but notes some discomfort in R shoulder when attempting to anchor the band for the diagonal. Changed the diagonal from a self-anchor to anchored in a closed door with better tolerance and progressed diagonals to include both D1 & D2 in both flexion and extension patterns while seated while reinforcing cervical ROM with eyes tracking movement of hand. HEP instructions only available for standing but pt cautioned to only attempt diagonals in sitting, with pt verbalizing understanding. [name] Trujillo notes exercises have also been helping with her sense of balance, noting decreasing reliance on her walking pole while ambulating.    Personal Factors and Comorbidities Time since onset of injury/illness/exacerbation;Past/Current Experience;Comorbidity 3+;Age    Comorbidities neuropathy; hypthyroidism; DDD; remote h/o back surgery at age 35; h/o scoliosis    Examination-Activity Limitations Bathing;Bend;Caring for Others;Carry;Dressing;Hygiene/Grooming;Lift;Locomotion Level;Squat;Stairs;Stand;Transfers    Examination-Participation Restrictions Cleaning;Community Activity;Driving;Laundry;Meal Prep;Shop;Yard Work    Rehab Potential Good    PT Frequency 2x / week    PT Duration 8 weeks    PT Treatment/Interventions ADLs/Self Care Home Management;Electrical Stimulation;Iontophoresis 4mg /ml Dexamethasone;Moist Heat;Ultrasound;Gait training;Stair training;Functional mobility training;Therapeutic activities;Therapeutic exercise;Balance training;Neuromuscular re-education;Patient/family education;Orthotic Fit/Training;Manual techniques;Passive range of motion;Dry needling;Taping;Joint Manipulations;Vestibular;Energy conservation    PT Next Visit Plan Postural ROM/strengthening; Progress cervical ROM as tolerated; Proximal LE strengthening targeting hip  ABD; Balance training & fall prevention    PT Home Exercise Plan MedBridge Access Code: (4/12, updated 5/5, 5/10, 5/17, 5/24)    Consulted and Agree with Plan of Care Patient           Patient will benefit from skilled therapeutic intervention in order to improve the following deficits and impairments:  Abnormal gait,Decreased activity tolerance,Decreased balance,Decreased coordination,Decreased endurance,Decreased knowledge of use of DME,Decreased mobility,Decreased range of motion,Decreased safety awareness,Decreased strength,Difficulty walking,Increased edema,Increased fascial restricitons,Increased muscle spasms,Impaired perceived functional ability,Impaired flexibility,Impaired sensation,Impaired UE functional use,Improper body mechanics,Postural dysfunction,Dizziness  Visit Diagnosis: Radiculopathy, cervical region  Muscle weakness (generalized)  Unsteadiness on  feet  Other abnormalities of gait and mobility  Foot drop, left  Foot drop, right     Problem List Patient Active Problem List   Diagnosis Date Noted  . 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 Ventricular premature beats 10/31/2015  . Heart murmur 10/31/2015    Marry Guan, PT, MPT 01/30/2021, 2:11 PM  [facility] [address]  [address] [place], [place], [address] Phone: [phone]   Fax:  [phone]  Name: [name] Trujillo MRN: [id number] Date of Birth: Apr 25, 1941

## 2021-01-30 NOTE — Patient Instructions (Signed)
    Access Code: [email or web address] URL: [email or web address] Date: 01/30/2021 Prepared by: Glenetta Hew  Exercises Seated Cervical Retraction - 2 x daily - 7 x weekly - 2 sets - 10 reps - 3 sec hold Seated Scapular Retraction - 3 x daily - 7 x weekly - 2 sets - 10 reps - 3 sec hold Gentle Levator Scapulae Stretch - 2 x daily - 7 x weekly - 2 sets - 1 reps - 30 sec hold Seated Gentle Upper Trapezius Stretch - 2 x daily - 7 x weekly - 2 sets - 1 reps - 30 sec hold Squat with Counter Support - 2 x daily - 3-4 x weekly - 2 sets - 10 reps - 1 hold Standing Single Leg Stance with Counter Support - 1 x daily - 3-4 x weekly - 2 sets - 1 reps - 30 seconds hold Seated Shoulder Row with Anchored Resistance - 1 x daily - 7 x weekly - 2 sets - 10 reps - 3-5 hold hold Seated Shoulder Extension and Scapular Retraction with Resistance - 1 x daily - 7 x weekly - 2 sets - 10 reps - 3-5 sec hold Seated Cervical Retraction and Rotation - 1 x daily - 7 x weekly - 2 sets - 10 reps - 3 sec hold Seated Bilateral Shoulder External Rotation with Resistance - 1 x daily - 7 x weekly - 2 sets - 10 reps - 3 sec hold Seated Shoulder Single Arm PNF D2 Flexion with Anchored Resistance - 1 x daily - 7 x weekly - 2 sets - 10 reps - 3 sec hold Seated Single Arm Shoulder PNF D1 Flexion with Anchored Resistance - 1 x daily - 7 x weekly - 2 sets - 10 reps - 3 sec hold Seated Shoulder Single Arm PNF D2 Extension with Anchored Resistance - 1 x daily - 7 x weekly - 2 sets - 10 reps - 3 sec hold Seated Single Arm Shoulder PNF D1 Extension with Anchored Resistance - 1 x daily - 7 x weekly - 2 sets - 10 reps - 3 sec hold

## 2021-02-01 ENCOUNTER — Other Ambulatory Visit: Payer: Self-pay

## 2021-02-01 ENCOUNTER — Ambulatory Visit: Payer: Medicare PPO | Admitting: Physical Therapy

## 2021-02-01 DIAGNOSIS — M5412 Radiculopathy, cervical region: Secondary | ICD-10-CM

## 2021-02-01 DIAGNOSIS — R2689 Other abnormalities of gait and mobility: Secondary | ICD-10-CM

## 2021-02-01 DIAGNOSIS — M6281 Muscle weakness (generalized): Secondary | ICD-10-CM

## 2021-02-01 DIAGNOSIS — M21371 Foot drop, right foot: Secondary | ICD-10-CM

## 2021-02-01 DIAGNOSIS — R2681 Unsteadiness on feet: Secondary | ICD-10-CM

## 2021-02-01 DIAGNOSIS — M21372 Foot drop, left foot: Secondary | ICD-10-CM

## 2021-02-01 NOTE — Therapy (Signed)
[facility] [address]  [place], [place], [address] Phone: [phone]   Fax:  [phone]  Physical Therapy Treatment  Patient Details  Name: [name] Trujillo MRN: [id number] Date of Birth: 08/04/1941 Referring Provider (PT): Dorena Dew, NP for [name], MD   Encounter Date: 02/01/2021   PT End of Session - 02/01/21 1358    Visit Number 9    Number of Visits 16    Date for PT Re-Evaluation 02/13/21    Authorization Type [employer]    PT Start Time 5284    PT Stop Time 1443    PT Time Calculation (min) 45 min    Activity Tolerance Patient tolerated treatment well    Behavior During Therapy [facility] for tasks assessed/performed           Past Medical History:  Diagnosis Date  . High cholesterol   . Hypothyroidism   . Neuropathy     Past Surgical History:  Procedure Laterality Date  . APPENDECTOMY     40-78 yo  . BACK SURGERY     Age 80  . CESAREAN SECTION    . ECTOPIC PREGNANCY SURGERY    . LAPAROSCOPIC HYSTERECTOMY      There were no vitals filed for this visit.   Subjective Assessment - 02/01/21 1404    Subjective Pt reports she was able to walk with her neighbor while carrying her hiking pole rather than having to use the pole - feels like her balance is getting much better.    Pertinent History 09/21/20 - ACDF C5-6-7    Patient Stated Goals "to strengthen my neck muscles and improve th motion in my neck to help with my balance"    Currently in Pain? No/denies                             [facility] Adult PT Treatment/Exercise - 02/01/21 1358      Neck Exercises: Machines for Strengthening   UBE (Upper Arm Bike) L3.0 x 6 min (3' fwd/3' back)      Neck Exercises: Theraband   Shoulder Extension 10 reps;Red    Shoulder Extension Limitations seated on orange Pball    Rows 10 reps;Red    Rows Limitations seated on orange Pball      Lumbar Exercises: Seated   Hip Flexion  on Ball Right;Left;10 reps    Hip Flexion on Ball Limitations oraneg Pball    Other Seated Lumbar Exercises R/L red TB pallof press x 12; seated on orange Pball      Knee/Hip Exercises: Standing   Hip Extension Right;Left;10 reps;Stengthening;Knee straight    Extension Limitations glute med 45 kick-back - looped yellow TB at ankles    Other Standing Knee Exercises Eccentric stand to sit with light glute tap to 2 Airex pads stacked on mat table x 10    Other Standing Knee Exercises L/R hip fwd/back arc in SLS 2 x 10 (focusing on level pelvis with slight hip hike) - 2nd set with looped yellow TB at ankles      Knee/Hip Exercises: Seated   Sit to Sand 10 reps;without UE support   cues for upright head & torso                   PT Short Term Goals - 01/16/21 1338      PT SHORT TERM GOAL #1   Title Patient will  be independent with initial HEP    Status Achieved   01/16/21            PT Long Term Goals - 02/01/21 1443      PT LONG TERM GOAL #1   Title Patient will be independent with ongoing/advanced HEP for self-management at home    Status Partially Met    Target Date 02/13/21      PT LONG TERM GOAL #2   Title Patient to improve cervical AROM to [facility] without pain provocation    Status On-going    Target Date 02/13/21      PT LONG TERM GOAL #3   Title Patient will demonstrate improved B shoulder strength to >/= 4 to 4+/5 for functional UE use    Status On-going    Target Date 02/13/21      PT LONG TERM GOAL #4   Title Patient will demonstrate improved B hip strength to >/= 4/5 and B ankle strength to >/= 3+/5 to 4-/5  for improved stability and ease of mobility    Status On-going    Target Date 02/13/21      PT LONG TERM GOAL #5   Title Patient will ambulate with improved gait pattern with decreased B foot drop/slap and no evidence of instability/LOB    Status Partially Met    Target Date 02/13/21      PT LONG TERM GOAL #6   Title Patient will improve Berg  score to >/= 46/56 to improve safety and stability with ADLs in standing and reduce risk for falls    Status On-going    Target Date 02/13/21                 Plan - 02/01/21 1443    Clinical Impression Statement [name] Trujillo reports improving posture and balance resulting in decreased reliance on her hiking pole while walking. Gait pattern with decreasing Trendelenburg but still slightly more pronounced on L vs R. Continued strengthening progression targeting proximal LE strength with sit to/from stand activities as well as addition of yellow TB to standing glute med exercises. Challenged balance and core strengthening with introduction of UB and LB exercises seated on orange Pball with good control observed. Pt noting some fatigue from "a good workout" today but no pain or other concerns. [name] Trujillo is progressing with her goals with some LTGs now partially met - will plan for more formal assessment of progress on next visit, but anticipate probable recert to further address strength, posture, balance and gait deficits.    Personal Factors and Comorbidities Time since onset of injury/illness/exacerbation;Past/Current Experience;Comorbidity 3+;Age    Comorbidities neuropathy; hypthyroidism; DDD; remote h/o back surgery at age 22; h/o scoliosis    Examination-Activity Limitations Bathing;Bend;Caring for Others;Carry;Dressing;Hygiene/Grooming;Lift;Locomotion Level;Squat;Stairs;Stand;Transfers    Examination-Participation Restrictions Cleaning;Community Activity;Driving;Laundry;Meal Prep;Shop;Yard Work    Rehab Potential Good    PT Frequency 2x / week    PT Duration 8 weeks    PT Treatment/Interventions ADLs/Self Care Home Management;Electrical Stimulation;Iontophoresis 4mg /ml Dexamethasone;Moist Heat;Ultrasound;Gait training;Stair training;Functional mobility training;Therapeutic activities;Therapeutic exercise;Balance training;Neuromuscular re-education;Patient/family education;Orthotic  Fit/Training;Manual techniques;Passive range of motion;Dry needling;Taping;Joint Manipulations;Vestibular;Energy conservation    PT Next Visit Plan 10th visit PN with probable recert; Postural ROM/strengthening; Progress cervical ROM as tolerated; Proximal LE strengthening targeting hip ABD; Balance training & fall prevention    PT Home Exercise Plan MedBridge Access Code: [email or web address] (4/12, updated 5/5, 5/10, 5/17, 5/24)    Consulted and Agree with Plan of Care Patient  Patient will benefit from skilled therapeutic intervention in order to improve the following deficits and impairments:  Abnormal gait,Decreased activity tolerance,Decreased balance,Decreased coordination,Decreased endurance,Decreased knowledge of use of DME,Decreased mobility,Decreased range of motion,Decreased safety awareness,Decreased strength,Difficulty walking,Increased edema,Increased fascial restricitons,Increased muscle spasms,Impaired perceived functional ability,Impaired flexibility,Impaired sensation,Impaired UE functional use,Improper body mechanics,Postural dysfunction,Dizziness  Visit Diagnosis: Radiculopathy, cervical region  Muscle weakness (generalized)  Unsteadiness on feet  Other abnormalities of gait and mobility  Foot drop, left  Foot drop, right     Problem List Patient Active Problem List   Diagnosis Date Noted  . 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 Ventricular premature beats 10/31/2015  . Heart murmur 10/31/2015    Percival Spanish, PT, MPT 02/01/2021, 4:43 PM  [facility] [address]  [place], [place], [address] Phone: [phone]   Fax:  [phone]  Name: [name] Trujillo MRN: [id number] Date of Birth: 1941/07/07

## 2021-02-13 ENCOUNTER — Ambulatory Visit: Payer: Medicare PPO | Admitting: Physical Therapy

## 2021-02-15 ENCOUNTER — Encounter: Payer: Self-pay | Admitting: Orthopedic Surgery

## 2021-02-15 ENCOUNTER — Ambulatory Visit: Payer: Self-pay

## 2021-02-15 ENCOUNTER — Ambulatory Visit: Payer: Medicare PPO | Attending: Registered" | Admitting: Physical Therapy

## 2021-02-15 ENCOUNTER — Ambulatory Visit (INDEPENDENT_AMBULATORY_CARE_PROVIDER_SITE_OTHER): Payer: Medicare PPO | Admitting: Orthopedic Surgery

## 2021-02-15 ENCOUNTER — Other Ambulatory Visit: Payer: Self-pay

## 2021-02-15 DIAGNOSIS — R2681 Unsteadiness on feet: Secondary | ICD-10-CM | POA: Insufficient documentation

## 2021-02-15 DIAGNOSIS — M6281 Muscle weakness (generalized): Secondary | ICD-10-CM | POA: Diagnosis present

## 2021-02-15 DIAGNOSIS — R2689 Other abnormalities of gait and mobility: Secondary | ICD-10-CM

## 2021-02-15 DIAGNOSIS — M5412 Radiculopathy, cervical region: Secondary | ICD-10-CM

## 2021-02-15 DIAGNOSIS — M25562 Pain in left knee: Secondary | ICD-10-CM

## 2021-02-15 DIAGNOSIS — M21371 Foot drop, right foot: Secondary | ICD-10-CM | POA: Insufficient documentation

## 2021-02-15 DIAGNOSIS — M21372 Foot drop, left foot: Secondary | ICD-10-CM

## 2021-02-15 NOTE — Progress Notes (Signed)
l °

## 2021-02-15 NOTE — Therapy (Signed)
[facility] [address]  [place], [place], [address] Phone: [phone]   Fax:  [phone]  Physical Therapy Treatment / Progress Note  Patient Details  Name: [name] Trujillo MRN: [id number] Date of Birth: 1940/09/20 Referring Provider (PT): Dorena Dew, NP for [name], MD  Progress Note  Reporting Period 12/19/2020 to 02/15/2021  See note below for Objective Data and Assessment of Progress/Goals.     Encounter Date: 02/15/2021   PT End of Session - 02/15/21 1321     Visit Number 10    Number of Visits 16    Date for PT Re-Evaluation 02/19/21    Authorization Type [employer]    PT Start Time 1321   PT arrived late   PT Stop Time 1402    PT Time Calculation (min) 41 min    Activity Tolerance Patient tolerated treatment well;Patient limited by pain   L knee pain limiting reassessment & balance testing   Behavior During Therapy WFL for tasks assessed/performed             Past Medical History:  Diagnosis Date   High cholesterol    Hypothyroidism    Neuropathy     Past Surgical History:  Procedure Laterality Date   APPENDECTOMY     102-18 yo   BACK SURGERY     Age 77   CESAREAN SECTION     ECTOPIC PREGNANCY SURGERY     LAPAROSCOPIC HYSTERECTOMY      There were no vitals filed for this visit.   Subjective Assessment - 02/15/21 1323     Subjective Pt reports she woke up with a swollen & painful L knee on Monday. On Sunday late she had been trying to toss a bucket of dirt and had pivoted on the knee - no pain/swelling at the time. Pain has limited her standing & walking tolerance since. She will be seeing Dr. Sharol Given today to have her knee looked at.    Pertinent History 09/21/20 - ACDF C5-6-7    Patient Stated Goals "to strengthen my neck muscles and improve the motion in my neck to help with my balance"    Currently in Pain? Yes    Pain Score 0-No pain   5/10 when standing or walking    Pain Location Knee    Pain Orientation Left    Pain Descriptors / Indicators Constant;Aching    Pain Type Acute pain    Pain Onset In the past 7 days    Pain Frequency Constant   when weight bearing   Aggravating Factors  standing & walking    Pain Relieving Factors ice    Effect of Pain on Daily Activities limited standing and walking tolerance, difficulty lifting her L LE                [facility] PT Assessment - 02/15/21 1321       Assessment   Medical Diagnosis Cervical ACDF C5-6-7    Referring Provider (PT) Dorena Dew, NP for [name], MD    Onset Date/Surgical Date 09/21/20    Hand Dominance Right    Next MD Visit 04/11/21      AROM   Cervical Flexion 44    Cervical Extension 45    Cervical - Right Side Bend 32    Cervical - Left Side Bend 25    Cervical - Right Rotation 58    Cervical - Left Rotation 52  Strength   Right Shoulder Flexion 4+/5    Right Shoulder ABduction 4+/5    Right Shoulder Internal Rotation 4+/5    Right Shoulder External Rotation 4/5    Left Shoulder Flexion 4+/5    Left Shoulder ABduction 4/5    Left Shoulder Internal Rotation 4+/5    Left Shoulder External Rotation 4/5    Right Elbow Flexion 5/5    Right Elbow Extension 4+/5    Left Elbow Flexion 5/5    Left Elbow Extension 4-/5    Right Hand Grip (lbs) 31.67   35, 30, 30   Left Hand Grip (lbs) 25   26, 25, 24   Right Hip Flexion 4/5    Right Hip Extension 4-/5    Right Hip External Rotation  4-/5    Right Hip Internal Rotation 4-/5    Right Hip ABduction 4/5    Right Hip ADduction 4/5    Left Hip Flexion 4/5    Left Hip Extension 4-/5    Left Hip External Rotation --   deferred MMT d/t new L knee pain   Left Hip Internal Rotation --   deferred MMT d/t new L knee pain   Left Hip ABduction 4/5    Left Hip ADduction 4/5    Right Ankle Dorsiflexion 3+/5    Right Ankle Plantar Flexion 3/5   unable to initiate single leg heel raise; 4/5 with manual resistance   Right Ankle  Inversion 3+/5    Right Ankle Eversion 3+/5    Left Ankle Dorsiflexion 2+/5    Left Ankle Plantar Flexion 3/5   unable to initiate single leg heel raise; 4-/5 with manual resistance   Left Ankle Inversion 2-/5    Left Ankle Eversion 3+/5                                   PT Education - 02/15/21 1400     Education Details Reviewed progress with PT and ongoing therapy amenable deficits - recert recommended; Discussed exercises to focus on until able to tolerate increased weight bearing on L knee    Person(s) Educated Patient    Methods Explanation    Comprehension Verbalized understanding              PT Short Term Goals - 01/16/21 1338       PT SHORT TERM GOAL #1   Title Patient will be independent with initial HEP    Status Achieved   01/16/21              PT Long Term Goals - 02/15/21 1328       PT LONG TERM GOAL #1   Title Patient will be independent with ongoing/advanced HEP for self-management at home    Status Partially Met      PT LONG TERM GOAL #2   Title Patient to improve cervical AROM to [facility] without pain provocation    Status Partially Met      PT LONG TERM GOAL #3   Title Patient will demonstrate improved B shoulder strength to >/= 4 to 4+/5 for functional UE use    Status Partially Met      PT LONG TERM GOAL #4   Title Patient will demonstrate improved B hip strength to >/= 4/5 and B ankle strength to >/= 3+/5 to 4-/5  for improved stability and ease of mobility    Status Partially Met  PT LONG TERM GOAL #5   Title Patient will ambulate with improved gait pattern with decreased B foot drop/slap and no evidence of instability/LOB    Status Partially Met      PT LONG TERM GOAL #6   Title Patient will improve Berg score to >/= 46/56 to improve safety and stability with ADLs in standing and reduce risk for falls    Status On-going                   Plan - 02/15/21 1402     Clinical Impression Statement  [name] Trujillo has demonstrated good progress with her posture, neck ROM and UE strength since starting PT following her ACDF C5-6-7 on 09/21/20. Cervical ROM improved in all planes with improved ability to check for traffic when crossing the street while out walking, but she still notes some limitation in her ability to carry on a conversation with someone seated next to her w/o turning her body. Overall B UE strength improved with average MMT now >/= 4/5 with only exception in L elbow extension at 4-/5. Grip strength also improving with L grip lagging behind R, although pt is R handed. [name] Trujillo reports people have noted her improved posture and she has improved tolerance for activities such sewing/quilting. We have also been making gains in LE strength and balance, however assessment today limited by new sudden onset of L knee pain and edema on Monday following a pivoting motion on her knee while working in the yard on Sunday evening - she will be seeing Dr. Meridee Score this afternoon to have her knee checked out. All STGs met with most LTGs at least partially met with exception of inability to complete balance testing due to limited weight bearing tolerance in standing and walking secondary to L knee pain/edema. [name] Trujillo has good potential to continue to benefit from skilled PT to further improve cervical ROM, UE and LE strength, as well as balance training to reduce fall risk - anticipate recert on next visit with potential formal assessment of L knee pain/edema as indicated following assessment by Dr. Sharol Given today.    Personal Factors and Comorbidities Time since onset of injury/illness/exacerbation;Past/Current Experience;Comorbidity 3+;Age    Comorbidities neuropathy; hypthyroidism; DDD; remote h/o back surgery at age 30; h/o scoliosis    Examination-Activity Limitations Bathing;Bend;Caring for Others;Carry;Dressing;Hygiene/Grooming;Lift;Locomotion Level;Squat;Stairs;Stand;Transfers    Examination-Participation  Restrictions Cleaning;Community Activity;Driving;Laundry;Meal Prep;Shop;Yard Work    Rehab Potential Good    PT Frequency 2x / week    PT Duration 8 weeks    PT Treatment/Interventions ADLs/Self Care Home Management;Electrical Stimulation;Iontophoresis 4mg /ml Dexamethasone;Moist Heat;Ultrasound;Gait training;Stair training;Functional mobility training;Therapeutic activities;Therapeutic exercise;Balance training;Neuromuscular re-education;Patient/family education;Orthotic Fit/Training;Manual techniques;Passive range of motion;Dry needling;Taping;Joint Manipulations;Vestibular;Energy conservation    PT Next Visit Plan Recert including knee assessment as indicated per Dr. Sharol Given; Postural ROM/strengthening; Progress cervical ROM as tolerated; Proximal LE strengthening targeting hip ABD; Balance training & fall prevention    PT Home Exercise Plan MedBridge Access Code: [email or web address] (4/12, updated 5/5, 5/10, 5/17, 5/24)    Consulted and Agree with Plan of Care Patient             Patient will benefit from skilled therapeutic intervention in order to improve the following deficits and impairments:  Abnormal gait, Decreased activity tolerance, Decreased balance, Decreased coordination, Decreased endurance, Decreased knowledge of use of DME, Decreased mobility, Decreased range of motion, Decreased safety awareness, Decreased strength, Difficulty walking, Increased edema, Increased fascial restricitons, Increased muscle spasms, Impaired perceived functional ability, Impaired flexibility, Impaired sensation, Impaired UE  functional use, Improper body mechanics, Postural dysfunction, Dizziness  Visit Diagnosis: Radiculopathy, cervical region  Muscle weakness (generalized)  Unsteadiness on feet  Other abnormalities of gait and mobility  Foot drop, left  Foot drop, right     Problem List Patient Active Problem List   Diagnosis Date Noted   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  [place], [place], [address] Phone: [phone]   Fax:  [phone]  Name: [name] Trujillo MRN: [id number] Date of Birth: April 03, 1941

## 2021-02-15 NOTE — Progress Notes (Signed)
Office Visit Note   Patient: [name] Trujillo           Date of Birth: 1941-04-04           MRN: [id number] Visit Date: 02/15/2021              Requested by: Mosetta Putt, MD [address] [place],  [place] [address] PCP: Mosetta Putt, MD  Chief Complaint  Patient presents with   Left Leg - Pain      HPI: 80 year old woman who fell on her left knee on 02/11/2021, patient states that she did have knee pain on Monday but she has been icing it, she states the swelling is down and she is able to ambulate with minimal pain.    Assessment & Plan: Visit Diagnoses:  1. Acute pain of left knee     Plan: Recommended continue ice she can try Voltaren gel increase her activities as tolerated.  Follow-Up Instructions: Return if symptoms worsen or fail to improve.   Ortho Exam  Patient is alert, oriented, no adenopathy, well-dressed, normal affect, normal respiratory effort. Examination patient ambulates without a limp using a cane.  Examination the left knee there is no ecchymosis no bruising there is mild swelling she has no tenderness to palpation over the medial or lateral joint line collaterals and cruciates are stable the patella tracks midline there is some mild crepitation with range of motion.  Imaging: XR Knee 1-2 Views Left  Result Date: 02/15/2021 2 view radiographs of the left knee shows no evidence of a fracture there is calcification of the medial and lateral meniscus.  Patella is midline.  No images are attached to the encounter.  Labs: Lab Results  Component Value Date   HGBA1C 5.6 04/05/2020   ESRSEDRATE 4 04/05/2020   ESRSEDRATE 6 11/26/2016     No results found for: ALBUMIN, PREALBUMIN, CBC  No results found for: MG No results found for: VD25OH  No results found for: PREALBUMIN No flowsheet data found.   There is no height or weight on file to calculate BMI.  Orders:  Orders Placed This Encounter  Procedures   XR Knee 1-2 Views Left   No  orders of the defined types were placed in this encounter.    Procedures: No procedures performed  Clinical Data: No additional findings.  ROS:  All other systems negative, except as noted in the HPI. Review of Systems  Objective: Vital Signs: There were no vitals taken for this visit.  Specialty Comments:  No specialty comments available.  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   Ventricular premature beats 10/31/2015   Heart murmur 10/31/2015   Past Medical History:  Diagnosis Date   High cholesterol    Hypothyroidism    Neuropathy     Family History  Problem Relation Age of Onset   Stroke Mother    Heart attack Father    Alcohol abuse Father    Neuropathy Neg Hx     Past Surgical History:  Procedure Laterality Date   APPENDECTOMY     67-20 yo   BACK SURGERY     Age 47   CESAREAN SECTION     ECTOPIC PREGNANCY SURGERY     LAPAROSCOPIC HYSTERECTOMY     Social History   Occupational History   Occupation: Retired  Tobacco Use   Smoking status: Never   Smokeless tobacco: Never  Substance and Sexual Activity  Alcohol use: Yes    Comment: once or twice a month   Drug use: No   Sexual activity: Not on file

## 2021-02-19 ENCOUNTER — Encounter: Payer: Self-pay | Admitting: Physical Therapy

## 2021-02-19 ENCOUNTER — Ambulatory Visit: Payer: Medicare PPO | Admitting: Physical Therapy

## 2021-02-19 ENCOUNTER — Other Ambulatory Visit: Payer: Self-pay

## 2021-02-19 DIAGNOSIS — M5412 Radiculopathy, cervical region: Secondary | ICD-10-CM

## 2021-02-19 DIAGNOSIS — M21371 Foot drop, right foot: Secondary | ICD-10-CM

## 2021-02-19 DIAGNOSIS — M6281 Muscle weakness (generalized): Secondary | ICD-10-CM

## 2021-02-19 DIAGNOSIS — M21372 Foot drop, left foot: Secondary | ICD-10-CM

## 2021-02-19 DIAGNOSIS — R2689 Other abnormalities of gait and mobility: Secondary | ICD-10-CM

## 2021-02-19 DIAGNOSIS — M25562 Pain in left knee: Secondary | ICD-10-CM

## 2021-02-19 DIAGNOSIS — R2681 Unsteadiness on feet: Secondary | ICD-10-CM

## 2021-02-19 NOTE — Therapy (Addendum)
[facility] [address]  [place], [place], [address] Phone: [phone]   Fax:  [phone]  Physical Therapy Treatment / Recert  Patient Details  Name: [name] Trujillo MRN: [id number] Date of Birth: 1941/02/19 Referring Provider (PT): Dorena Dew, NP for [name], MD   Progress Note  Reporting Period 12/19/2020 to 02/19/2021  See note below for Objective Data and Assessment of Progress/Goals.     Encounter Date: 02/19/2021   PT End of Session - 02/19/21 1701     Visit Number 11    Number of Visits 23    Date for PT Re-Evaluation 04/06/21    Authorization Type [employer]    Authorization Time Period 12/19/20 - 02/19/21    Authorization - Visit Number 10    Authorization - Number of Visits 17    PT Start Time 1701    PT Stop Time 7680    PT Time Calculation (min) 47 min    Activity Tolerance Patient tolerated treatment well;Patient limited by pain    Behavior During Therapy WFL for tasks assessed/performed             Past Medical History:  Diagnosis Date   High cholesterol    Hypothyroidism    Neuropathy     Past Surgical History:  Procedure Laterality Date   APPENDECTOMY     55-20 yo   BACK SURGERY     Age 76   CESAREAN SECTION     ECTOPIC PREGNANCY SURGERY     LAPAROSCOPIC HYSTERECTOMY      There were no vitals filed for this visit.   Subjective Assessment - 02/19/21 1707     Subjective Pt reports her L knee pain and edema has been improving allowing for longer standing tolerance for activities such as ironing. Dr. Sharol Given unable to identify any acute problem - he told her "it was likely just a bump in the road" and just told her to take it easy until the pain is gone. She has been able to manage the pain with just ice. Mao expressing interest in continuing with PT to further work on cerivcal ROM, ankle strength and balance.    Pertinent History 09/21/20 - ACDF C5-6-7    Patient  Stated Goals "to strengthen my neck muscles and improve the motion in my neck to help with my balance"    Currently in Pain? Yes    Pain Score 1     Pain Location Knee    Pain Orientation Left;Medial    Pain Descriptors / Indicators Sore    Pain Type Acute pain    Pain Onset 1 to 4 weeks ago    Pain Frequency Intermittent    Pain Relieving Factors ice                [facility] PT Assessment - 02/19/21 1701       Assessment   Medical Diagnosis Cervical ACDF C5-6-7    Referring Provider (PT) Dorena Dew, NP for [name], MD    Onset Date/Surgical Date 09/21/20    Hand Dominance Right    Next MD Visit 04/11/21      Prior Function   Level of Independence Independent    Vocation Retired    Leisure walking 2x./day for total of ~1.5 miles; working in the yard; sewing/quilting      AROM   Cervical Flexion 44    Cervical Extension 45    Cervical - Right Side Bend  32    Cervical - Left Side Bend 25    Cervical - Right Rotation 58    Cervical - Left Rotation 52      Strength   Right Shoulder Flexion 4+/5    Right Shoulder ABduction 4+/5    Right Shoulder Internal Rotation 4+/5    Right Shoulder External Rotation 4/5    Left Shoulder Flexion 4+/5    Left Shoulder ABduction 4/5    Left Shoulder Internal Rotation 4+/5    Left Shoulder External Rotation 4/5    Right Elbow Flexion 5/5    Right Elbow Extension 4+/5    Left Elbow Flexion 5/5    Left Elbow Extension 4-/5    Right Hand Grip (lbs) 31.67   35, 30, 30   Left Hand Grip (lbs) 25   26, 25, 24   Right Hip Flexion 4/5    Right Hip Extension 4-/5    Right Hip External Rotation  4-/5    Right Hip Internal Rotation 4-/5    Right Hip ABduction 4/5    Right Hip ADduction 4/5    Left Hip Flexion 4/5    Left Hip Extension 4-/5    Left Hip External Rotation 4-/5    Left Hip Internal Rotation 4-/5    Left Hip ABduction 4/5    Left Hip ADduction 4/5    Right Knee Flexion 5/5    Right Knee Extension 5/5    Left Knee  Flexion 4+/5    Left Knee Extension 5/5    Right Ankle Dorsiflexion 3+/5    Right Ankle Plantar Flexion 3/5   unable to initiate single leg heel raise; 4/5 with manual resistance   Right Ankle Inversion 3+/5    Right Ankle Eversion 3+/5    Left Ankle Dorsiflexion 2+/5    Left Ankle Plantar Flexion 3/5   unable to initiate single leg heel raise; 4-/5 with manual resistance   Left Ankle Inversion 2-/5    Left Ankle Eversion 3+/5      Ambulation/Gait   Ambulation/Gait Assistance 5: Supervision    Ambulation Distance (Feet) 180 Feet    Assistive device None;Other (Comment)   single walking pole   Gait Pattern Step-through pattern;Trendelenburg;Lateral hip instability;Decreased dorsiflexion - right;Decreased dorsiflexion - left;Poor foot clearance - left;Poor foot clearance - right;Ataxic   L>R foot slap   Ambulation Surface Level;Indoor    Gait velocity 2.55 ft/sec w/o AD;  2.68 ft/sec with hiking pole      Standardized Balance Assessment   Five times sit to stand comments  14.56 sec   with no LOB   10 Meter Walk 12.84 sec w/o AD; 12.26 sec with hiking pole      Berg Balance Test   Sit to Stand Able to stand without using hands and stabilize independently    Standing Unsupported Able to stand safely 2 minutes    Sitting with Back Unsupported but Feet Supported on Floor or Stool Able to sit safely and securely 2 minutes    Stand to Sit Sits safely with minimal use of hands    Transfers Able to transfer safely, minor use of hands    Standing Unsupported with Eyes Closed Able to stand 3 seconds    Standing Unsupported with Feet Together Able to place feet together independently and stand 1 minute safely    From Standing, Reach Forward with Outstretched Arm Can reach forward >12 cm safely (5")    From Standing Position, Pick up Object from Floor  Able to pick up shoe, needs supervision    From Standing Position, Turn to Look Behind Over each Shoulder Turn sideways only but maintains balance     Turn 360 Degrees Able to turn 360 degrees safely but slowly    Standing Unsupported, Alternately Place Feet on Step/Stool Able to stand independently and complete 8 steps >20 seconds    Standing Unsupported, One Foot in Front Able to take small step independently and hold 30 seconds    Standing on One Leg Able to lift leg independently and hold equal to or more than 3 seconds    Total Score 43    Berg comment: 37-45 significant risk for falls (>80%)      Timed Up and Go Test   Normal TUG (seconds) 11.5   w/o AD; 12.28 sec with hiking pole                          [facility] Adult PT Treatment/Exercise - 02/19/21 1701       Neck Exercises: Machines for Strengthening   UBE (Upper Arm Bike) L3.5 x 6 min (3' fwd/3' back)                      PT Short Term Goals - 01/16/21 1338       PT SHORT TERM GOAL #1   Title Patient will be independent with initial HEP    Status Achieved   01/16/21              PT Long Term Goals - 02/19/21 1744       PT LONG TERM GOAL #1   Title Patient will be independent with ongoing/advanced HEP for self-management at home    Status Partially Met   02/19/21 - met for current HEP   Target Date 04/06/21      PT LONG TERM GOAL #2   Title Patient to improve cervical AROM to [facility] without pain provocation    Status Partially Met   02/19/21 - met except B rotation remains functionally limited   Target Date 04/06/21      PT LONG TERM GOAL #3   Title Patient will demonstrate improved B shoulder strength to >/= 4 to 4+/5 for functional UE use    Status Partially Met   02/19/21 - met except L remains mildly weaker than R   Target Date 04/06/21      PT LONG TERM GOAL #4   Title Patient will demonstrate improved B hip strength to >/= 4/5 and B ankle strength to >/= 3+/5 to 4-/5  for improved stability and ease of mobility    Status Partially Met    Target Date 04/06/21      PT LONG TERM GOAL #5   Title Patient will ambulate with  improved gait pattern with decreased B foot drop/slap and no evidence of instability/LOB    Status Partially Met   02/19/21 - Improved gait pattern but ongoing mild [place] hip drop and continued L>R foot slap   Target Date 04/06/21      PT LONG TERM GOAL #6   Title Patient will improve Berg score to >/= 46/56 to improve safety and stability with ADLs in standing and reduce risk for falls    Baseline 28/56    Status Partially Met   02/19/21 [name] improved to 43/56   Target Date 04/06/21  Plan - 02/19/21 1748     Clinical Impression Statement Desare has demonstrated good progress across all therapy deficits since starting PT following her ACDF C5-6-7 on 09/21/20. Cervical ROM now [facility] for most motions with only functional limitation remaining in B rotation as she still notes some limitation in her ability to carry on a conversation with someone seated next to her w/o turning her body. Overall B UE strength improved with average MMT now >/= 4/5 with only exception in L elbow extension at 4-/5. Grip strength also improving with L grip lagging behind R, although pt is R handed. Tessla reports people have noted her improved posture and she has improved tolerance for activities such sewing/quilting. Improvements also noted in B hip and ankle strength with average MMT improved by -1 grade although still shy of her baseline as of the end of her last therapy episode in 2021. Significant gains noted in balance and gait stability with gait speed increased to 2.55 ft/sec w/o AD and 2.68 ft/sec with hiking pole, Berg increased from 28/56 to 43/56, 5xSTS reduced from 22.81 sec to 14.56 sec, and TUG reduced from 15.25 sec to 11.5 sec - all testing indicating decreasing risk for falls, but still higher risk than her baseline as of conclusion of her 2021 PT episode where her Berg = 52/56 and her gait speed was 2.99-3.21 ft/sec. STG met and all LTGs now at least partially met. Osha  continues to demonstrate good potential for further improvement based on higher PLOF and remains very motivated to improve her cervical ROM, UE and LE strength (esp ankle strength to reduce her foot slap) and balance to reduce her dependence on an AD for gait and increase her ability to safely participate in community activities, therefore will recommend recert for additional 2x/wk x 6 weeks to address ongoing deficits. Ortho evaluation of her recent onset acute L knee pain inconclusive but current presentation potentially indicative of pes anserine bursitis, therefore, will recommend addition of ionto patch to current POC to address L medial knee pain as indicated.    Personal Factors and Comorbidities Time since onset of injury/illness/exacerbation;Past/Current Experience;Comorbidity 3+;Age    Comorbidities neuropathy; hypthyroidism; DDD; remote h/o back surgery at age 37; h/o scoliosis    Examination-Activity Limitations Bathing;Bend;Caring for Others;Carry;Dressing;Hygiene/Grooming;Lift;Locomotion Level;Squat;Stairs;Stand;Transfers    Examination-Participation Restrictions Cleaning;Community Activity;Driving;Laundry;Meal Prep;Shop;Yard Work    Rehab Potential Good    PT Frequency 2x / week    PT Duration 6 weeks    PT Treatment/Interventions ADLs/Self Care Home Management;Electrical Stimulation;Iontophoresis 4mg /ml Dexamethasone;Moist Heat;Ultrasound;Gait training;Stair training;Functional mobility training;Therapeutic activities;Therapeutic exercise;Balance training;Neuromuscular re-education;Patient/family education;Orthotic Fit/Training;Manual techniques;Passive range of motion;Dry needling;Taping;Joint Manipulations;Vestibular;Energy conservation    PT Next Visit Plan Neck FOTO; Postural ROM/strengthening; Progress cervical ROM as tolerated; Proximal LE strengthening targeting hip ABD and ankle strength; Balance training & fall prevention    PT Home Exercise Plan MedBridge Access Code: [email or web address]  (4/12, updated 5/5, 5/10, 5/17, 5/24)    Consulted and Agree with Plan of Care Patient             Patient will benefit from skilled therapeutic intervention in order to improve the following deficits and impairments:  Abnormal gait, Decreased activity tolerance, Decreased balance, Decreased coordination, Decreased endurance, Decreased knowledge of use of DME, Decreased mobility, Decreased range of motion, Decreased safety awareness, Decreased strength, Difficulty walking, Increased edema, Increased fascial restricitons, Increased muscle spasms, Impaired perceived functional ability, Impaired flexibility, Impaired sensation, Impaired UE functional use, Improper body mechanics, Postural dysfunction, Dizziness  Visit Diagnosis: Radiculopathy, cervical  region  Muscle weakness (generalized)  Unsteadiness on feet  Other abnormalities of gait and mobility  Foot drop, left  Foot drop, right  Acute pain of left knee     Problem List Patient Active Problem List   Diagnosis Date Noted   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

## 2021-02-21 ENCOUNTER — Encounter: Payer: Self-pay | Admitting: Physical Therapy

## 2021-02-21 ENCOUNTER — Other Ambulatory Visit: Payer: Self-pay

## 2021-02-21 ENCOUNTER — Ambulatory Visit: Payer: Medicare PPO | Admitting: Physical Therapy

## 2021-02-21 DIAGNOSIS — M25562 Pain in left knee: Secondary | ICD-10-CM

## 2021-02-21 DIAGNOSIS — M5412 Radiculopathy, cervical region: Secondary | ICD-10-CM

## 2021-02-21 DIAGNOSIS — M21371 Foot drop, right foot: Secondary | ICD-10-CM

## 2021-02-21 DIAGNOSIS — R2681 Unsteadiness on feet: Secondary | ICD-10-CM

## 2021-02-21 DIAGNOSIS — M21372 Foot drop, left foot: Secondary | ICD-10-CM

## 2021-02-21 DIAGNOSIS — R2689 Other abnormalities of gait and mobility: Secondary | ICD-10-CM

## 2021-02-21 DIAGNOSIS — M6281 Muscle weakness (generalized): Secondary | ICD-10-CM

## 2021-02-21 NOTE — Patient Instructions (Signed)
      Access Code: [email or web address] URL: [email or web address] Date: 02/21/2021 Prepared by: Glenetta Hew  Exercises Gastroc Stretch on Wall - 2-3 x daily - 7 x weekly - 3 reps - 30 sec hold Soleus Stretch on Wall - 2-3 x daily - 7 x weekly - 3 reps - 30 sec hold Isometric Ankle Eversion at Wall - 1 x daily - 7 x weekly - 2 sets - 10 reps - 3 sec hold Isometric Ankle Inversion at Wall - 1 x daily - 7 x weekly - 2 sets - 10 reps - 3 sec hold Seated Ankle Plantar Flexion with Resistance Loop - 1 x daily - 7 x weekly - 2 sets - 10 reps - 3 sec hold Seated Ankle Dorsiflexion with Resistance - 1 x daily - 7 x weekly - 2 sets - 10 reps - 3 sec hold Seated Ankle Eversion with Resistance - 1 x daily - 7 x weekly - 2 sets - 10 reps - 3 sec hold Ankle Inversion with Anchored Resistance at Table - 1 x daily - 7 x weekly - 2 sets - 10 reps - 3 sec hold

## 2021-02-21 NOTE — Therapy (Addendum)
[facility] [address]  [place], [place], [address] Phone: [phone]   Fax:  [phone]  Physical Therapy Treatment  Patient Details  Name: [name] Trujillo MRN: [id number] Date of Birth: [birth year] Referring Provider (PT): Dorena Dew, NP for [name], MD   Encounter Date: 02/21/2021   PT End of Session - 02/21/21 1101     Visit Number 12    Number of Visits 23    Date for PT Re-Evaluation 04/06/21    Authorization Type [employer]    Authorization Time Period 02/21/21 - 04/06/21    Authorization - Visit Number 1    Authorization - Number of Visits 12    PT Start Time 1101    PT Stop Time 1145    PT Time Calculation (min) 44 min    Activity Tolerance Patient tolerated treatment well      Behavior During Therapy [facility] for tasks assessed/performed             Past Medical History:  Diagnosis Date   High cholesterol    Hypothyroidism    Neuropathy     Past Surgical History:  Procedure Laterality Date   APPENDECTOMY     78-20 yo   BACK SURGERY     Age 80   CESAREAN SECTION     ECTOPIC PREGNANCY SURGERY     LAPAROSCOPIC HYSTERECTOMY      There were no vitals filed for this visit.   Subjective Assessment - 02/21/21 1104     Subjective Pt reports her knee did not need icing this morning and she was able to stand to iron this morning w/o issue from her L knee. She is requesting review of the UE diagonal exercises and would like to focus more on her feet and ankles today.    Pertinent History 09/21/20 - ACDF C5-6-7    Patient Stated Goals "to strengthen my neck muscles and improve the motion in my neck to help with my balance"    Pain Onset 1 to 4 weeks ago                               [facility] Adult PT Treatment/Exercise - 02/21/21 1101       Neck Exercises: Machines for Strengthening   UBE (Upper Arm Bike) L3.5 x 6 min (3' fwd/3' back)      Neck Exercises: Seated    Upper Extremity D1 15 reps;Flexion;Theraband    Theraband Level (UE D1) Level 2 (Red)   band anchored in door   UE D1 Limitations head following hand    Upper Extremity D2 15 reps;Flexion;Theraband    Theraband Level (UE D2) Level 2 (Red)   band anchored in door   UE D2 Limitations head following hand      Ankle Exercises: Seated   Other Seated Ankle Exercises Yellow TB R 3-way ankle DF/IV/EV & B red TB PF x 15 - cues for posture & scap engagement while stablizing theraband to reduce neck strain    Other Seated Ankle Exercises L ankle IV/EV isometric with ball against wall 10 x 3"      Ankle Exercises: Stretches   Soleus Stretch 3 reps;30 seconds    Soleus Stretch Limitations runner's stretch with UE support on back of chair    Gastroc Stretch 3 reps;30 seconds    Gastroc Stretch Limitations runner's stretch with UE support on back of  chair                    PT Education - 02/21/21 1144     Education Details HEP update - ankle stretched & strengthening - Access Code: [email or web address]    Person(s) Educated Patient    Methods Explanation;Demonstration;Verbal cues;Handout    Comprehension Verbalized understanding;Verbal cues required;Returned demonstration;Need further instruction              PT Short Term Goals - 01/16/21 1338       PT SHORT TERM GOAL #1   Title Patient will be independent with initial HEP    Status Achieved   01/16/21              PT Long Term Goals - 02/19/21 1744       PT LONG TERM GOAL #1   Title Patient will be independent with ongoing/advanced HEP for self-management at home    Status Partially Met   02/19/21 - met for current HEP   Target Date 04/06/21      PT LONG TERM GOAL #2   Title Patient to improve cervical AROM to [facility] without pain provocation    Status Partially Met   02/19/21 - met except B rotation remains functionally limited   Target Date 04/06/21      PT LONG TERM GOAL #3   Title Patient will demonstrate improved B  shoulder strength to >/= 4 to 4+/5 for functional UE use    Status Partially Met   02/19/21 - met except L remains mildly weaker than R   Target Date 04/06/21      PT LONG TERM GOAL #4   Title Patient will demonstrate improved B hip strength to >/= 4/5 and B ankle strength to >/= 3+/5 to 4-/5  for improved stability and ease of mobility    Status Partially Met    Target Date 04/06/21      PT LONG TERM GOAL #5   Title Patient will ambulate with improved gait pattern with decreased B foot drop/slap and no evidence of instability/LOB    Status Partially Met   02/19/21 - Improved gait pattern but ongoing mild [place] hip drop and continued L>R foot slap   Target Date 04/06/21      PT LONG TERM GOAL #6   Title Patient will improve Berg score to >/= 46/56 to improve safety and stability with ADLs in standing and reduce risk for falls    Baseline 28/56    Status Partially Met   02/19/21 [name] improved to 43/56   Target Date 04/06/21                   Plan - 02/21/21 1107     Clinical Impression Statement Reviewed technique for UE flexion diagonals in sitting at pt's request, determining that pt was positioning chair too far forward at home resulting in increased trunk rotation - Ireta reporting good understanding and providing good return demonstration after review. Per pt request, proceeded with instruction in ankle stretching and basic strengthening to address ongoing foot slap with ambulation - pt able to demonstrate good control with red TB for B PF and yellow TB for R ankle DF/IV/EV but L ankle lacking as much control, therefore DF completed w/o resistance (just AROM into gravity) and IV/EV adjusted to isometric muscle activation with ball on wall. Cues necessary during ankle exercises to maintain good posture and scapular engagement while holding TB for LE motions. HEP updated  to include instruction in ankle stretching and strengthening per tolerance demonstrated today and will  continue progress as able in upcoming visits.    Personal Factors and Comorbidities Time since onset of injury/illness/exacerbation;Past/Current Experience;Comorbidity 3+;Age    Comorbidities neuropathy; hypthyroidism; DDD; remote h/o back surgery at age 81; h/o scoliosis    Examination-Activity Limitations Bathing;Bend;Caring for Others;Carry;Dressing;Hygiene/Grooming;Lift;Locomotion Level;Squat;Stairs;Stand;Transfers    Examination-Participation Restrictions Cleaning;Community Activity;Driving;Laundry;Meal Prep;Shop;Yard Work    Rehab Potential Good    PT Frequency 2x / week    PT Duration 6 weeks    PT Treatment/Interventions ADLs/Self Care Home Management;Electrical Stimulation;Iontophoresis 4mg /ml Dexamethasone;Moist Heat;Ultrasound;Gait training;Stair training;Functional mobility training;Therapeutic activities;Therapeutic exercise;Balance training;Neuromuscular re-education;Patient/family education;Orthotic Fit/Training;Manual techniques;Passive range of motion;Dry needling;Taping;Joint Manipulations;Vestibular;Energy conservation    PT Next Visit Plan Neck FOTO; Review & progression of ankle exercises PRN; Postural ROM/strengthening; Progress cervical ROM as tolerated; Proximal LE strengthening targeting hip ABD and ankle strength; Balance training & fall prevention    PT Home Exercise Plan MedBridge Access Code: [email or web address] (4/12, updated 5/5, 5/10, 5/17, 5/24); ankle HEP: [email or web address] (6/15)    Consulted and Agree with Plan of Care Patient             Patient will benefit from skilled therapeutic intervention in order to improve the following deficits and impairments:  Abnormal gait, Decreased activity tolerance, Decreased balance, Decreased coordination, Decreased endurance, Decreased knowledge of use of DME, Decreased mobility, Decreased range of motion, Decreased safety awareness, Decreased strength, Difficulty walking, Increased edema, Increased fascial restricitons, Increased muscle  spasms, Impaired perceived functional ability, Impaired flexibility, Impaired sensation, Impaired UE functional use, Improper body mechanics, Postural dysfunction, Dizziness  Visit Diagnosis: Radiculopathy, cervical region  Muscle weakness (generalized)  Unsteadiness on feet  Other abnormalities of gait and mobility  Foot drop, left  Foot drop, right  Acute pain of left knee     Problem List Patient Active Problem List   Diagnosis Date Noted   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  [place], [place], [address] Phone: [phone]   Fax:  [phone]  Name: Alazae Crymes MRN: [id number] Date of Birth: June 15, 1941

## 2021-02-27 ENCOUNTER — Encounter: Payer: Medicare PPO | Admitting: Physical Therapy

## 2021-02-28 ENCOUNTER — Encounter: Payer: Self-pay | Admitting: Physical Therapy

## 2021-02-28 ENCOUNTER — Other Ambulatory Visit: Payer: Self-pay

## 2021-02-28 ENCOUNTER — Ambulatory Visit: Payer: Medicare PPO | Admitting: Physical Therapy

## 2021-02-28 DIAGNOSIS — M6281 Muscle weakness (generalized): Secondary | ICD-10-CM

## 2021-02-28 DIAGNOSIS — R2681 Unsteadiness on feet: Secondary | ICD-10-CM

## 2021-02-28 DIAGNOSIS — R2689 Other abnormalities of gait and mobility: Secondary | ICD-10-CM

## 2021-02-28 DIAGNOSIS — M5412 Radiculopathy, cervical region: Secondary | ICD-10-CM | POA: Diagnosis not present

## 2021-02-28 DIAGNOSIS — M25562 Pain in left knee: Secondary | ICD-10-CM

## 2021-02-28 DIAGNOSIS — M21372 Foot drop, left foot: Secondary | ICD-10-CM

## 2021-02-28 DIAGNOSIS — M21371 Foot drop, right foot: Secondary | ICD-10-CM

## 2021-02-28 NOTE — Patient Instructions (Signed)
    Access Code: [email or web address] URL: [email or web address] Date: 02/28/2021 Prepared by: Glenetta Hew  Exercises Mid-Lower Cervical Extension SNAG with Strap - 1 x daily - 7 x weekly - 2 sets - 10 reps - 3 sec hold Seated Assisted Cervical Rotation with Towel - 1 x daily - 7 x weekly - 2 sets - 10 reps - 3 sec hold

## 2021-02-28 NOTE — Therapy (Addendum)
[facility] [address]  [place], [place], [address] Phone: [phone]   Fax:  [phone]  Physical Therapy Treatment  Patient Details  Name: [name] Trujillo MRN: [id number] Date of Birth: 10-16-1940 Referring Provider (PT): Dorena Dew, NP for [name], MD   Encounter Date: 02/28/2021   PT End of Session - 02/28/21 1022     Visit Number 13    Number of Visits 23    Date for PT Re-Evaluation 04/06/21    Authorization Type [employer]    Authorization Time Period 02/21/21 - 04/06/21    Authorization - Visit Number 2    Authorization - Number of Visits 12    PT Start Time 1022    PT Stop Time 1106    PT Time Calculation (min) 44 min    Activity Tolerance Patient tolerated treatment well      Behavior During Therapy [facility] for tasks assessed/performed             Past Medical History:  Diagnosis Date   High cholesterol    Hypothyroidism    Neuropathy     Past Surgical History:  Procedure Laterality Date   APPENDECTOMY     91-18 yo   BACK SURGERY     Age 69   CESAREAN SECTION     ECTOPIC PREGNANCY SURGERY     LAPAROSCOPIC HYSTERECTOMY      There were no vitals filed for this visit.   Subjective Assessment - 02/28/21 1026     Subjective Pt reports her knee is doing much better - she was able to resume walking ~1/2 her normal distance w/o pain and did not have to ice her knee afterwards. Swelling has gone down as well. She also notes improving UE and grip strength with band exercises.    Pertinent History 09/21/20 - ACDF C5-6-7    Patient Stated Goals "to strengthen my neck muscles and improve the motion in my neck to help with my balance"    Currently in Pain? No/denies                               [facility] Adult PT Treatment/Exercise - 02/28/21 1022       Neck Exercises: Machines for Strengthening   UBE (Upper Arm Bike) L3.5 x 8 min (4' fwd/4' back)      Neck  Exercises: Theraband   Shoulder Extension 15 reps;Red    Shoulder Extension Limitations standing    Rows 15 reps;Red    Rows Limitations standing      Neck Exercises: Seated   Cervical Rotation Right;Left;10 reps    Cervical Rotation Limitations gentle cervical rotation SNAGs - no overpressure    Other Seated Exercise gentle cervical extension SNAGs with pillowcase 10 x 3"      Ankle Exercises: Stretches   Soleus Stretch 3 reps;30 seconds    Soleus Stretch Limitations runner's stretch with UE support on wall    Gastroc Stretch 3 reps;30 seconds    Gastroc Stretch Limitations runner's stretch with UE support on wall                    PT Education - 02/28/21 1106     Education Details HEP update - cervical AAROM for extension & rotation (avoiding overpressure) - Access Code: [email or web address]    Person(s) Educated Patient    Methods Explanation;Demonstration;Verbal cues;Handout    Comprehension  Verbalized understanding;Verbal cues required;Returned demonstration;Need further instruction              PT Short Term Goals - 01/16/21 1338       PT SHORT TERM GOAL #1   Title Patient will be independent with initial HEP    Status Achieved   01/16/21              PT Long Term Goals - 02/19/21 1744       PT LONG TERM GOAL #1   Title Patient will be independent with ongoing/advanced HEP for self-management at home    Status Partially Met   02/19/21 - met for current HEP   Target Date 04/06/21      PT LONG TERM GOAL #2   Title Patient to improve cervical AROM to [facility] without pain provocation    Status Partially Met   02/19/21 - met except B rotation remains functionally limited   Target Date 04/06/21      PT LONG TERM GOAL #3   Title Patient will demonstrate improved B shoulder strength to >/= 4 to 4+/5 for functional UE use    Status Partially Met   02/19/21 - met except L remains mildly weaker than R   Target Date 04/06/21      PT LONG TERM GOAL #4   Title Patient  will demonstrate improved B hip strength to >/= 4/5 and B ankle strength to >/= 3+/5 to 4-/5  for improved stability and ease of mobility    Status Partially Met    Target Date 04/06/21      PT LONG TERM GOAL #5   Title Patient will ambulate with improved gait pattern with decreased B foot drop/slap and no evidence of instability/LOB    Status Partially Met   02/19/21 - Improved gait pattern but ongoing mild [place]burg hip drop and continued L>R foot slap   Target Date 04/06/21      PT LONG TERM GOAL #6   Title Patient will improve Berg score to >/= 46/56 to improve safety and stability with ADLs in standing and reduce risk for falls    Baseline 28/56    Status Partially Met   02/19/21 [name] Trujillo improved to 43/56   Target Date 04/06/21                   Plan - 02/28/21 1106     Clinical Impression Statement [name] Trujillo reports her recent knee pain seems to be resolving and her walking tolerance is improving w/o pain interference. She notes limited performance of recent HEP addition of ankle exercises d/t fear of aggravating her knee and requested clarification of ankle stretches - reviewed gastroc/soleus stretches clarifying positioning to best isolate desired stretch. Pt wishing to practice the theraband exercises on her own and will report any concerns next visit. She notes continued limitation in cervical extension and rotation ROM, therefore provided instruction in gentle self-assisted ROM/SNAGs avoiding overpressure - pt able to provide good return demonstration after verbal and visual cues. With knee pain improving, pt wishing to try resuming standing version of rows & retraction/shoulder extension again - able to complete these exercises w/o LOB but did experience LOB when attempting to progress UE diagonals to standing. [name] Trujillo will continue to benefit from skilled PT to further improve cervical ROM, postural control, LE strength (esp ankle strength) as well as balance and gait stability.     Personal Factors and Comorbidities Time since onset of injury/illness/exacerbation;Past/Current Experience;Comorbidity 3+;Age  Comorbidities neuropathy; hypthyroidism; DDD; remote h/o back surgery at age 27; h/o scoliosis    Examination-Activity Limitations Bathing;Bend;Caring for Others;Carry;Dressing;Hygiene/Grooming;Lift;Locomotion Level;Squat;Stairs;Stand;Transfers    Examination-Participation Restrictions Cleaning;Community Activity;Driving;Laundry;Meal Prep;Shop;Yard Work    Rehab Potential Good    PT Frequency 2x / week    PT Duration 6 weeks    PT Treatment/Interventions ADLs/Self Care Home Management;Electrical Stimulation;Iontophoresis 4mg /ml Dexamethasone;Moist Heat;Ultrasound;Gait training;Stair training;Functional mobility training;Therapeutic activities;Therapeutic exercise;Balance training;Neuromuscular re-education;Patient/family education;Orthotic Fit/Training;Manual techniques;Passive range of motion;Dry needling;Taping;Joint Manipulations;Vestibular;Energy conservation    PT Next Visit Plan Neck FOTO; Review & progression of ankle exercises PRN; Postural ROM/strengthening; Progress cervical ROM as tolerated; Proximal LE strengthening targeting hip ABD and ankle strength; Balance training & fall prevention    PT Home Exercise Plan MedBridge Access Code: [email or web address] (4/12, updated 5/5, 5/10, 5/17, 5/24); ankle HEP: [email or web address] (6/15)    Consulted and Agree with Plan of Care Patient             Patient will benefit from skilled therapeutic intervention in order to improve the following deficits and impairments:  Abnormal gait, Decreased activity tolerance, Decreased balance, Decreased coordination, Decreased endurance, Decreased knowledge of use of DME, Decreased mobility, Decreased range of motion, Decreased safety awareness, Decreased strength, Difficulty walking, Increased edema, Increased fascial restricitons, Increased muscle spasms, Impaired perceived functional ability,  Impaired flexibility, Impaired sensation, Impaired UE functional use, Improper body mechanics, Postural dysfunction, Dizziness  Visit Diagnosis: Radiculopathy, cervical region  Muscle weakness (generalized)  Unsteadiness on feet  Other abnormalities of gait and mobility  Foot drop, left  Foot drop, right  Acute pain of left knee     Problem List Patient Active Problem List   Diagnosis Date Noted   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

## 2021-03-06 ENCOUNTER — Other Ambulatory Visit: Payer: Self-pay

## 2021-03-06 ENCOUNTER — Ambulatory Visit: Payer: Medicare PPO | Admitting: Physical Therapy

## 2021-03-06 ENCOUNTER — Encounter: Payer: Self-pay | Admitting: Physical Therapy

## 2021-03-06 DIAGNOSIS — M25562 Pain in left knee: Secondary | ICD-10-CM

## 2021-03-06 DIAGNOSIS — R2689 Other abnormalities of gait and mobility: Secondary | ICD-10-CM

## 2021-03-06 DIAGNOSIS — M5412 Radiculopathy, cervical region: Secondary | ICD-10-CM

## 2021-03-06 DIAGNOSIS — M21371 Foot drop, right foot: Secondary | ICD-10-CM

## 2021-03-06 DIAGNOSIS — M21372 Foot drop, left foot: Secondary | ICD-10-CM

## 2021-03-06 DIAGNOSIS — M6281 Muscle weakness (generalized): Secondary | ICD-10-CM

## 2021-03-06 DIAGNOSIS — R2681 Unsteadiness on feet: Secondary | ICD-10-CM

## 2021-03-06 NOTE — Therapy (Addendum)
[facility] [address]  [place], [place], [address] Phone: [phone]   Fax:  [phone]  Physical Therapy Treatment  Patient Details  Name: [name] Trujillo MRN: [id number] Date of Birth: [birth year] Referring Provider (PT): Dorena Dew, NP for [name], MD   Encounter Date: 03/06/2021   PT End of Session - 03/06/21 1316     Visit Number 14    Number of Visits 23    Date for PT Re-Evaluation 04/06/21    Authorization Type [employer]    Authorization Time Period 02/21/21 - 04/06/21    Authorization - Visit Number 3    Authorization - Number of Visits 12    PT Start Time 1316    PT Stop Time 1402    PT Time Calculation (min) 46 min    Activity Tolerance Patient tolerated treatment well      Behavior During Therapy [facility] for tasks assessed/performed             Past Medical History:  Diagnosis Date   High cholesterol    Hypothyroidism    Neuropathy     Past Surgical History:  Procedure Laterality Date   APPENDECTOMY     20-20 yo   BACK SURGERY     Age 55   CESAREAN SECTION     ECTOPIC PREGNANCY SURGERY     LAPAROSCOPIC HYSTERECTOMY      There were no vitals filed for this visit.   Subjective Assessment - 03/06/21 1320     Subjective Pt reports her knee doesn't usually bother anymore - only a little tender when she sits and flexes her knee all the way back.    Pertinent History 09/21/20 - ACDF C5-6-7    Patient Stated Goals "to strengthen my neck muscles and improve the motion in my neck to help with my balance"    Currently in Pain? No/denies                [facility] PT Assessment - 03/06/21 1316       Observation/Other Assessments   Focus on Therapeutic Outcomes (FOTO)  Neck: FS = 42                           [facility] Adult PT Treatment/Exercise - 03/06/21 1316       Neck Exercises: Seated   Cervical Rotation Right;Left;10 reps    Cervical Rotation  Limitations gentle cervical rotation SNAGs - no overpressure   cues for proper alignement of pillow case to guide/not force movement   Other Seated Exercise gentle cervical extension SNAGs with pillowcase 10 x 3"      Knee/Hip Exercises: Aerobic   Recumbent Bike L3 x 6 min      Knee/Hip Exercises: Standing   Hip Extension Left;10 reps;Stengthening;Knee straight    Extension Limitations glute med 45 kick-back   mirror feedback to help maintain level pelvis   Other Standing Knee Exercises L/R hip fwd/side/back arc in opp SLS 2 x 10 (focusing on level pelvis with slight hip hike) - 2nd set on R with looped yellow TB at ankles   UE support on back of chair for balance - mirror feedback to help maintain level pelvis     Ankle Exercises: Seated   Other Seated Ankle Exercises Red TB R 4-way ankle & L yellow TB PF x 15 - cues for posture & scap engagement while stablizing theraband to reduce neck  strain   Pharmacist, hospital for posture   Other Seated Ankle Exercises L ankle IV/EV isometric with ball against wall 10 x 3"                      PT Short Term Goals - 01/16/21 1338       PT SHORT TERM GOAL #1   Title Patient will be independent with initial HEP    Status Achieved   01/16/21              PT Long Term Goals - 02/19/21 1744       PT LONG TERM GOAL #1   Title Patient will be independent with ongoing/advanced HEP for self-management at home    Status Partially Met   02/19/21 - met for current HEP   Target Date 04/06/21      PT LONG TERM GOAL #2   Title Patient to improve cervical AROM to [facility] without pain provocation    Status Partially Met   02/19/21 - met except B rotation remains functionally limited   Target Date 04/06/21      PT LONG TERM GOAL #3   Title Patient will demonstrate improved B shoulder strength to >/= 4 to 4+/5 for functional UE use    Status Partially Met   02/19/21 - met except L remains mildly weaker than R   Target Date 04/06/21      PT LONG  TERM GOAL #4   Title Patient will demonstrate improved B hip strength to >/= 4/5 and B ankle strength to >/= 3+/5 to 4-/5  for improved stability and ease of mobility    Status Partially Met    Target Date 04/06/21      PT LONG TERM GOAL #5   Title Patient will ambulate with improved gait pattern with decreased B foot drop/slap and no evidence of instability/LOB    Status Partially Met   02/19/21 - Improved gait pattern but ongoing mild [place] hip drop and continued L>R foot slap   Target Date 04/06/21      PT LONG TERM GOAL #6   Title Patient will improve Berg score to >/= 46/56 to improve safety and stability with ADLs in standing and reduce risk for falls    Baseline 28/56    Status Partially Met   02/19/21 [name] improved to 43/56   Target Date 04/06/21                   Plan - 03/06/21 1324     Clinical Impression Statement [name] Trujillo reports mild soreness in her neck which she is not sure if it is a result of the pillowcase assisted exercises or how she was turning her head to talk to a friend while sewing yesterday. Reviewed cervical SNAGs emphasize placement of pillowcase and alignment relative to head and neck during motions while reinforcing avoidance of overpressure into end ROM. Her L knee pain continues to improve allowing her to resume hip strengthening in standing to reduce Trendelenburg drop, although more difficulty noted with maintaining level pelvis in R SLS therefore mirror offered for visual feedback. Progressed ankle strengthening with R ankle able to advance to red TB for all motions, although L ankle remaining at yellow TB and/or isometric strengthening level. [name] will continue to benefit from skilled PT for further core, postural and LE strengthening to improve balance and gait stability.    Personal Factors and Comorbidities Time since onset of injury/illness/exacerbation;Past/Current Experience;Comorbidity 3+;Age  Comorbidities neuropathy; hypthyroidism;  DDD; remote h/o back surgery at age 23; h/o scoliosis    Examination-Activity Limitations Bathing;Bend;Caring for Others;Carry;Dressing;Hygiene/Grooming;Lift;Locomotion Level;Squat;Stairs;Stand;Transfers    Examination-Participation Restrictions Cleaning;Community Activity;Driving;Laundry;Meal Prep;Shop;Yard Work    Rehab Potential Good    PT Frequency 2x / week    PT Duration 6 weeks    PT Treatment/Interventions ADLs/Self Care Home Management;Electrical Stimulation;Iontophoresis 54m/ml Dexamethasone;Moist Heat;Ultrasound;Gait training;Stair training;Functional mobility training;Therapeutic activities;Therapeutic exercise;Balance training;Neuromuscular re-education;Patient/family education;Orthotic Fit/Training;Manual techniques;Passive range of motion;Dry needling;Taping;Joint Manipulations;Vestibular;Energy conservation    PT Next Visit Plan Review & progression of ankle exercises PRN; Postural ROM/strengthening; Progress cervical ROM as tolerated; Proximal LE strengthening targeting hip ABD and ankle strength; Balance training & fall prevention    PT Home Exercise Plan MedBridge Access Code: [email or web address](4/12, updated 5/5, 5/10, 5/17, 5/24); ankle HEP: [email or web address] (6/15); [email or web address] (6/22)    Consulted and Agree with Plan of Care Patient             Patient will benefit from skilled therapeutic intervention in order to improve the following deficits and impairments:  Abnormal gait, Decreased activity tolerance, Decreased balance, Decreased coordination, Decreased endurance, Decreased knowledge of use of DME, Decreased mobility, Decreased range of motion, Decreased safety awareness, Decreased strength, Difficulty walking, Increased edema, Increased fascial restricitons, Increased muscle spasms, Impaired perceived functional ability, Impaired flexibility, Impaired sensation, Impaired UE functional use, Improper body mechanics, Postural dysfunction, Dizziness  Visit Diagnosis: Radiculopathy, cervical  region  Muscle weakness (generalized)  Unsteadiness on feet  Other abnormalities of gait and mobility  Foot drop, left  Foot drop, right  Acute pain of left knee     Problem List Patient Active Problem List   Diagnosis Date Noted   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 [place] [address]Phone: [phone]  Fax:  [phone] Name: CPati ThinnesMRN: [id number] of Birth: 607-30-1942

## 2021-03-08 ENCOUNTER — Ambulatory Visit: Payer: Medicare PPO | Admitting: Physical Therapy

## 2021-03-08 ENCOUNTER — Other Ambulatory Visit: Payer: Self-pay

## 2021-03-08 ENCOUNTER — Encounter: Payer: Self-pay | Admitting: Physical Therapy

## 2021-03-08 DIAGNOSIS — M5412 Radiculopathy, cervical region: Secondary | ICD-10-CM | POA: Diagnosis not present

## 2021-03-08 DIAGNOSIS — M21371 Foot drop, right foot: Secondary | ICD-10-CM

## 2021-03-08 DIAGNOSIS — M6281 Muscle weakness (generalized): Secondary | ICD-10-CM

## 2021-03-08 DIAGNOSIS — M25562 Pain in left knee: Secondary | ICD-10-CM

## 2021-03-08 DIAGNOSIS — R2689 Other abnormalities of gait and mobility: Secondary | ICD-10-CM

## 2021-03-08 DIAGNOSIS — M21372 Foot drop, left foot: Secondary | ICD-10-CM

## 2021-03-08 DIAGNOSIS — R2681 Unsteadiness on feet: Secondary | ICD-10-CM

## 2021-03-08 NOTE — Therapy (Addendum)
[facility] [address]  [place], [place], [address] Phone: [phone]   Fax:  [phone]  Physical Therapy Evaluation  Patient Details  Name: [name] Trujillo MRN: [id number] Date of Birth: 06-15-1941 Referring Provider (PT): Dorena Dew, NP for [name], MD   Encounter Date: 03/08/2021   PT End of Session - 03/08/21 1400     Visit Number 15    Number of Visits 23    Date for PT Re-Evaluation 04/06/21    Authorization Type [employer]    Authorization Time Period 02/21/21 - 04/06/21    Authorization - Visit Number 4    Authorization - Number of Visits 12    PT Start Time 1400    PT Stop Time 1449    PT Time Calculation (min) 49 min    Activity Tolerance Patient tolerated treatment well      Behavior During Therapy [facility] for tasks assessed/performed             Past Medical History:  Diagnosis Date   High cholesterol    Hypothyroidism    Neuropathy     Past Surgical History:  Procedure Laterality Date   APPENDECTOMY     24-18 yo   BACK SURGERY     Age 51   CESAREAN SECTION     ECTOPIC PREGNANCY SURGERY     LAPAROSCOPIC HYSTERECTOMY      There were no vitals filed for this visit.    Subjective Assessment - 03/08/21 1406     Subjective Pt reports she stopped the towel assisted neck motion as it seemed to be bothering her neck. She inquired about trying balance activities on unstable surfaces.    Pertinent History 09/21/20 - ACDF C5-6-7    Patient Stated Goals "to strengthen my neck muscles and improve the motion in my neck to help with my balance"    Currently in Pain? No/denies                            Objective measurements completed on examination: See above findings.       [place] Adult PT Treatment/Exercise - 03/08/21 1400       Neck Exercises: Theraband   Horizontal ABduction 10 reps;Green    Horizontal ABduction Limitations seated      Neck  Exercises: Standing   Upper Extremity D1 Flexion;Extension;10 reps;Theraband    Theraband Level (UE D1) Level 1 (Yellow)    UE D1 Limitations head following hand    Upper Extremity D2 Flexion;Extension;10 reps;Theraband    Theraband Level (UE D2) Level 1 (Yellow)    UE D2 Limitations head following hand      Knee/Hip Exercises: Aerobic   Recumbent Bike L2 x 6 min      Knee/Hip Exercises: Standing   Functional Squat 10 reps;5 seconds;3 sets    Functional Squat Limitations 1st set - pt noting slight medial knee pain; 2nd set + hip ADD ball squeeze & 3rd set + yellow TB hip ABD isometric - no pain with latter 2 sets   counter squat with chair behind for safety                Balance Exercises - 03/08/21 1400       Balance Exercises: Standing   Standing Eyes Opened Foam/compliant surface;Narrow base of support (BOS);2 reps;30 secs;Head turns;5 reps   1 x 30 seconds each with arms at sides &  crossed on chest; 1 x 5 head turns each left and right, up and down   Standing Eyes Closed Foam/compliant surface;Narrow base of support (BOS);3 reps   CGA/HHA required when eyes closed and only able to balance for 3-5 sec   Partial Tandem Stance Foam/compliant surface;Eyes open;2 reps;30 secs   1 x 30 seconds each with arms at sides & crossed on chest; 1 x 5 head turns each left and right, up and down              PT Education - 03/08/21 1449     Education Details HEP review & progression - UE diagonals progressed to standing; green TB provided for horiz ABD; hip ADD and ABD isometrics added to coutner squats    Person(s) Educated Patient    Methods Explanation;Demonstration;Verbal cues;Tactile cues;Handout    Comprehension Verbalized understanding;Verbal cues required;Tactile cues required;Returned demonstration;Need further instruction              PT Short Term Goals - 01/16/21 1338       PT SHORT TERM GOAL #1   Title Patient will be independent with initial HEP    Status  Achieved   01/16/21              PT Long Term Goals - 02/19/21 1744       PT LONG TERM GOAL #1   Title Patient will be independent with ongoing/advanced HEP for self-management at home    Status Partially Met   02/19/21 - met for current HEP   Target Date 04/06/21      PT LONG TERM GOAL #2   Title Patient to improve cervical AROM to [facility] without pain provocation    Status Partially Met   02/19/21 - met except B rotation remains functionally limited   Target Date 04/06/21      PT LONG TERM GOAL #3   Title Patient will demonstrate improved B shoulder strength to >/= 4 to 4+/5 for functional UE use    Status Partially Met   02/19/21 - met except L remains mildly weaker than R   Target Date 04/06/21      PT LONG TERM GOAL #4   Title Patient will demonstrate improved B hip strength to >/= 4/5 and B ankle strength to >/= 3+/5 to 4-/5  for improved stability and ease of mobility    Status Partially Met    Target Date 04/06/21      PT LONG TERM GOAL #5   Title Patient will ambulate with improved gait pattern with decreased B foot drop/slap and no evidence of instability/LOB    Status Partially Met   02/19/21 - Improved gait pattern but ongoing mild [place] hip drop and continued L>R foot slap   Target Date 04/06/21      PT LONG TERM GOAL #6   Title Patient will improve Berg score to >/= 46/56 to improve safety and stability with ADLs in standing and reduce risk for falls    Baseline 28/56    Status Partially Met   02/19/21 [name] improved to 43/56   Target Date 04/06/21                    Plan - 03/08/21 1408     Clinical Impression Statement [name] Trujillo reports she has deferred the assisted neck ROM exercises as she felt that they were making her neck sore - PT confirming plan to hold off on these exercises. She reports her knee is  feeling much better and inquired about resuming some of her standing exercises and balance activities including resuming balance activities on  unstable surfaces. Attempted corner balance on Airex pad reviewing progression of complexity along with changing BOS - pt able to perform all activities without significant LOB in narrow BOS except eyes closed but found progression to partial tandem stance more challenging. Attempted counter squats but still noting slight L knee pain - resolved with addition of hip ADD ball squeeze or yellow TB hip ABD isometric. Also progressed UE diagonals from sitting to standing reducing resistance to yellow TB from red that she has been using in sitting in order to incorporate more balance component with UE and head movements - no significant LOB observed but pt having to pause to restabilize occasionally. [name] Trujillo continues to demonstrate good progress with posture and balance and notes decreasing reliance on walking pole for balance.    Personal Factors and Comorbidities Time since onset of injury/illness/exacerbation;Past/Current Experience;Comorbidity 3+;Age    Comorbidities neuropathy; hypthyroidism; DDD; remote h/o back surgery at age 82; h/o scoliosis    Examination-Activity Limitations Bathing;Bend;Caring for Others;Carry;Dressing;Hygiene/Grooming;Lift;Locomotion Level;Squat;Stairs;Stand;Transfers    Examination-Participation Restrictions Cleaning;Community Activity;Driving;Laundry;Meal Prep;Shop;Yard Work    Rehab Potential Good    PT Frequency 2x / week    PT Duration 6 weeks    PT Treatment/Interventions ADLs/Self Care Home Management;Electrical Stimulation;Iontophoresis 4mg /ml Dexamethasone;Moist Heat;Ultrasound;Gait training;Stair training;Functional mobility training;Therapeutic activities;Therapeutic exercise;Balance training;Neuromuscular re-education;Patient/family education;Orthotic Fit/Training;Manual techniques;Passive range of motion;Dry needling;Taping;Joint Manipulations;Vestibular;Energy conservation    PT Next Visit Plan Review & progression of ankle exercises PRN; Postural ROM/strengthening;  Progress cervical ROM as tolerated; Proximal LE strengthening targeting hip ABD and ankle strength; Balance training & fall prevention    PT Home Exercise Plan MedBridge Access Code: [email or web address] (4/12, updated 5/5, 5/10, 5/17, 5/24); ankle HEP: [email or web address] (6/15); [email or web address] (6/22)    Consulted and Agree with Plan of Care Patient             Patient will benefit from skilled therapeutic intervention in order to improve the following deficits and impairments:  Abnormal gait, Decreased activity tolerance, Decreased balance, Decreased coordination, Decreased endurance, Decreased knowledge of use of DME, Decreased mobility, Decreased range of motion, Decreased safety awareness, Decreased strength, Difficulty walking, Increased edema, Increased fascial restricitons, Increased muscle spasms, Impaired perceived functional ability, Impaired flexibility, Impaired sensation, Impaired UE functional use, Improper body mechanics, Postural dysfunction, Dizziness  Visit Diagnosis: Radiculopathy, cervical region  Muscle weakness (generalized)  Unsteadiness on feet  Other abnormalities of gait and mobility  Foot drop, left  Foot drop, right  Acute pain of left knee     Problem List Patient Active Problem List   Diagnosis Date Noted   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  [place], [place], [address] Phone: [phone]   Fax:  [phone]  Name: [name] Trujillo MRN: [id number] Date of Birth: 1941/04/07

## 2021-03-15 ENCOUNTER — Other Ambulatory Visit: Payer: Self-pay

## 2021-03-15 ENCOUNTER — Encounter: Payer: Self-pay | Admitting: Physical Therapy

## 2021-03-15 ENCOUNTER — Ambulatory Visit: Payer: Medicare PPO | Attending: Registered" | Admitting: Physical Therapy

## 2021-03-15 DIAGNOSIS — R2681 Unsteadiness on feet: Secondary | ICD-10-CM | POA: Diagnosis present

## 2021-03-15 DIAGNOSIS — M6281 Muscle weakness (generalized): Secondary | ICD-10-CM

## 2021-03-15 DIAGNOSIS — M25562 Pain in left knee: Secondary | ICD-10-CM | POA: Diagnosis present

## 2021-03-15 DIAGNOSIS — R2689 Other abnormalities of gait and mobility: Secondary | ICD-10-CM | POA: Insufficient documentation

## 2021-03-15 DIAGNOSIS — M21371 Foot drop, right foot: Secondary | ICD-10-CM | POA: Diagnosis present

## 2021-03-15 DIAGNOSIS — M5412 Radiculopathy, cervical region: Secondary | ICD-10-CM

## 2021-03-15 DIAGNOSIS — M21372 Foot drop, left foot: Secondary | ICD-10-CM

## 2021-03-15 NOTE — Therapy (Signed)
[facility] [address]  [place], [place], [address] Phone: [phone]   Fax:  [phone]  Physical Therapy Treatment  Patient Details  Name: [name] Trujillo MRN: [id number] Date of Birth: 1940-09-18 Referring Provider (PT): Dorena Dew, NP for [name] Amber, MD   Encounter Date: 03/15/2021   PT End of Session - 03/15/21 1314     Visit Number 16    Number of Visits 23    Date for PT Re-Evaluation 04/06/21    Authorization Type [employer]    Authorization Time Period 02/21/21 - 04/06/21    Authorization - Visit Number 5    Authorization - Number of Visits 12    PT Start Time 1314    PT Stop Time 1359    PT Time Calculation (min) 45 min    Activity Tolerance Patient tolerated treatment well    Behavior During Therapy [facility] for tasks assessed/performed             Past Medical History:  Diagnosis Date   High cholesterol    Hypothyroidism    Neuropathy     Past Surgical History:  Procedure Laterality Date   APPENDECTOMY     42-18 yo   BACK SURGERY     Age 10   CESAREAN SECTION     ECTOPIC PREGNANCY SURGERY     LAPAROSCOPIC HYSTERECTOMY      There were no vitals filed for this visit.   Subjective Assessment - 03/15/21 1318     Subjective Pt reports she went to the beach for 5 days - forgot to take her exercise bands but did walk on the beach. Some scissoring while walking on the beach but no LOB and did not use walking poles. Her knee continues to do well.    Pertinent History 09/21/20 - ACDF C5-6-7    Patient Stated Goals "to strengthen my neck muscles and improve the motion in my neck to help with my balance"    Currently in Pain? No/denies                               [facility] Adult PT Treatment/Exercise - 03/15/21 1314       Knee/Hip Exercises: Aerobic   Recumbent Bike L2 x 6 min      Knee/Hip Exercises: Standing   Heel Raises Both;10 reps;2 seconds    Heel  Raises Limitations UE support on counter    Other Standing Knee Exercises Yellow TB fwd & back monster walk 6 x 10 ft, single UE support on counter                 Balance Exercises - 03/15/21 1314       Balance Exercises: Standing   Standing Eyes Opened Foam/compliant surface;Narrow base of support (BOS);2 reps;30 secs;Head turns;5 reps   1 x 30 seconds each with arms at sides & crossed on chest; 1 x 5 head turns each left and right, up and down & diagonals   Tandem Stance Eyes open;Intermittent upper extremity support;30 secs   head turns   Wall Bumps Shoulder;Hip;10 reps;Eyes opened    Partial Tandem Stance Foam/compliant surface;Eyes open;2 reps;30 secs   1 x 30 seconds each with arms at sides & crossed on chest; 1 x 5 head turns each left and right, up and down   Retro Gait 5 reps;Upper extremity support   along counter  PT Short Term Goals - 01/16/21 1338       PT SHORT TERM GOAL #1   Title Patient will be independent with initial HEP    Status Achieved   01/16/21              PT Long Term Goals - 03/15/21 1356       PT LONG TERM GOAL #1   Title Patient will be independent with ongoing/advanced HEP for self-management at home    Status Partially Met   02/19/21 - met for current HEP   Target Date 04/06/21      PT LONG TERM GOAL #2   Title Patient to improve cervical AROM to [facility] without pain provocation    Status Partially Met   02/19/21 - met except B rotation remains functionally limited   Target Date 04/06/21      PT LONG TERM GOAL #3   Title Patient will demonstrate improved B shoulder strength to >/= 4 to 4+/5 for functional UE use    Status Partially Met   02/19/21 - met except L remains mildly weaker than R   Target Date 04/06/21      PT LONG TERM GOAL #4   Title Patient will demonstrate improved B hip strength to >/= 4/5 and B ankle strength to >/= 3+/5 to 4-/5  for improved stability and ease of mobility    Status Partially Met     Target Date 04/06/21      PT LONG TERM GOAL #5   Title Patient will ambulate with improved gait pattern with decreased B foot drop/slap and no evidence of instability/LOB    Status Partially Met   02/19/21 - Improved gait pattern but ongoing mild [place] hip drop and continued L>R foot slap   Target Date 04/06/21      PT LONG TERM GOAL #6   Title Patient will improve Berg score to >/= 46/56 to improve safety and stability with ADLs in standing and reduce risk for falls    Baseline 28/56    Status Partially Met   02/19/21 [name] improved to 43/56   Target Date 04/06/21                   Plan - 03/15/21 1359     Clinical Impression Statement [name] Trujillo reports she forgot to take her exercise bands with her on vacation and can tell the difference after not performing her exercises for 5 days. She was pleased with her ability to walk on the sand at the beach w/o an AD but did note increased scissoring when walking on soft sand and had a friend close by to reach out for as needed to steady herself. She requested review of corner balance progression with instructions reviewed for typical challenge progression including foot positioning, support surfaces, hand positioning, and head movements. Further targeted facilitation of balance reactions with hip and ankle control and strengthening activities as well as increasing comfort with retro stepping.    Comorbidities neuropathy; hypthyroidism; DDD; remote h/o back surgery at age 60; h/o scoliosis    Rehab Potential Good    PT Frequency 2x / week    PT Duration 6 weeks    PT Treatment/Interventions ADLs/Self Care Home Management;Electrical Stimulation;Iontophoresis 4mg /ml Dexamethasone;Moist Heat;Ultrasound;Gait training;Stair training;Functional mobility training;Therapeutic activities;Therapeutic exercise;Balance training;Neuromuscular re-education;Patient/family education;Orthotic Fit/Training;Manual techniques;Passive range of motion;Dry  needling;Taping;Joint Manipulations;Vestibular;Energy conservation    PT Next Visit Plan Review & progression of ankle exercises PRN; Postural ROM/strengthening; Progress cervical ROM as tolerated;  Proximal LE strengthening targeting hip ABD and ankle strength; Balance training & fall prevention    PT Home Exercise Plan MedBridge Access Code: [email or web address] (4/12, updated 5/5, 5/10, 5/17, 5/24); ankle HEP: [email or web address] (6/15); [email or web address] (6/22)    Consulted and Agree with Plan of Care Patient             Patient will benefit from skilled therapeutic intervention in order to improve the following deficits and impairments:  Abnormal gait, Decreased activity tolerance, Decreased balance, Decreased coordination, Decreased endurance, Decreased knowledge of use of DME, Decreased mobility, Decreased range of motion, Decreased safety awareness, Decreased strength, Difficulty walking, Increased edema, Increased fascial restricitons, Increased muscle spasms, Impaired perceived functional ability, Impaired flexibility, Impaired sensation, Impaired UE functional use, Improper body mechanics, Postural dysfunction, Dizziness  Visit Diagnosis: Radiculopathy, cervical region  Muscle weakness (generalized)  Unsteadiness on feet  Other abnormalities of gait and mobility  Foot drop, left  Foot drop, right  Acute pain of left knee     Problem List Patient Active Problem List   Diagnosis Date Noted   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  [place], [place], [address] Phone: [phone]   Fax:  [phone]  Name: [name] Trujillo MRN: [id number] Date of Birth: 1941-03-10

## 2021-03-20 ENCOUNTER — Ambulatory Visit: Payer: Medicare PPO | Admitting: Physical Therapy

## 2021-03-20 ENCOUNTER — Encounter: Payer: Self-pay | Admitting: Physical Therapy

## 2021-03-20 ENCOUNTER — Other Ambulatory Visit: Payer: Self-pay

## 2021-03-20 DIAGNOSIS — M6281 Muscle weakness (generalized): Secondary | ICD-10-CM

## 2021-03-20 DIAGNOSIS — M21371 Foot drop, right foot: Secondary | ICD-10-CM

## 2021-03-20 DIAGNOSIS — M21372 Foot drop, left foot: Secondary | ICD-10-CM

## 2021-03-20 DIAGNOSIS — M5412 Radiculopathy, cervical region: Secondary | ICD-10-CM | POA: Diagnosis not present

## 2021-03-20 DIAGNOSIS — R2689 Other abnormalities of gait and mobility: Secondary | ICD-10-CM

## 2021-03-20 DIAGNOSIS — M25562 Pain in left knee: Secondary | ICD-10-CM

## 2021-03-20 DIAGNOSIS — R2681 Unsteadiness on feet: Secondary | ICD-10-CM

## 2021-03-20 NOTE — Therapy (Signed)
[facility] [address]  [place], [place], [address] Phone: [phone]   Fax:  [phone]  Physical Therapy Treatment  Patient Details  Name: [name] Trujillo MRN: [id number] Date of Birth: 26-Jun-1941 Referring Provider (PT): Dorena Dew, NP for [name] Amber, MD   Encounter Date: 03/20/2021   PT End of Session - 03/20/21 1312     Visit Number 17    Number of Visits 23    Date for PT Re-Evaluation 04/06/21    Authorization Type [employer]    Authorization Time Period 02/21/21 - 04/06/21    Authorization - Visit Number 6    Authorization - Number of Visits 12    PT Start Time 9485    PT Stop Time 1356    PT Time Calculation (min) 44 min    Activity Tolerance Patient tolerated treatment well    Behavior During Therapy [facility] for tasks assessed/performed             Past Medical History:  Diagnosis Date   High cholesterol    Hypothyroidism    Neuropathy     Past Surgical History:  Procedure Laterality Date   APPENDECTOMY     24-20 yo   BACK SURGERY     Age 30   CESAREAN SECTION     ECTOPIC PREGNANCY SURGERY     LAPAROSCOPIC HYSTERECTOMY      There were no vitals filed for this visit.   Subjective Assessment - 03/20/21 1316     Subjective Pt still noting issues with posterior LOB and states she had to drop back to the red TB with the standing rows so as not to overbalance.    Pertinent History 09/21/20 - ACDF C5-6-7    Patient Stated Goals "to strengthen my neck muscles and improve the motion in my neck to help with my balance"    Currently in Pain? No/denies                               [facility] Adult PT Treatment/Exercise - 03/20/21 1312       Neck Exercises: Theraband   Shoulder Extension 15 reps;Red    Shoulder Extension Limitations standing with staggered stance    Rows 15 reps;Red    Rows Limitations standing with staggered stance      Knee/Hip Exercises:  Aerobic   Recumbent Bike L3 x 6 min      Knee/Hip Exercises: Standing   Other Standing Knee Exercises R/L fwd step with crossbody reach to cone atop 36" FR then return to neutral trying to avoid overcorrection with posterior LOB   PT providing CGA/SBA with gait belt   Other Standing Knee Exercises R/L step-back with trunk flexion and fwd UE reach to facilitate stepping strategies for balance reactions - initially with single UE support on back of chair, progressing to TRX then no support with chair close by for safety   PT providing CGA/SBA with gait belt                     PT Short Term Goals - 01/16/21 1338       PT SHORT TERM GOAL #1   Title Patient will be independent with initial HEP    Status Achieved   01/16/21              PT Long Term Goals - 03/15/21 1356  PT LONG TERM GOAL #1   Title Patient will be independent with ongoing/advanced HEP for self-management at home    Status Partially Met   02/19/21 - met for current HEP   Target Date 04/06/21      PT LONG TERM GOAL #2   Title Patient to improve cervical AROM to [facility] without pain provocation    Status Partially Met   02/19/21 - met except B rotation remains functionally limited   Target Date 04/06/21      PT LONG TERM GOAL #3   Title Patient will demonstrate improved B shoulder strength to >/= 4 to 4+/5 for functional UE use    Status Partially Met   02/19/21 - met except L remains mildly weaker than R   Target Date 04/06/21      PT LONG TERM GOAL #4   Title Patient will demonstrate improved B hip strength to >/= 4/5 and B ankle strength to >/= 3+/5 to 4-/5  for improved stability and ease of mobility    Status Partially Met    Target Date 04/06/21      PT LONG TERM GOAL #5   Title Patient will ambulate with improved gait pattern with decreased B foot drop/slap and no evidence of instability/LOB    Status Partially Met   02/19/21 - Improved gait pattern but ongoing mild [place] hip drop and  continued L>R foot slap   Target Date 04/06/21      PT LONG TERM GOAL #6   Title Patient will improve Berg score to >/= 46/56 to improve safety and stability with ADLs in standing and reduce risk for falls    Baseline 28/56    Status Partially Met   02/19/21 [name] improved to 43/56   Target Date 04/06/21                   Plan - 03/20/21 1319     Clinical Impression Statement [name] Trujillo expressing concerns regarding limited stability with activities where she is more likely to lose her balance posteriorly as she feels that she has more difficulty righting herself with posterior LOB, thus therapy session focusing strengthening targeting core stability in staggered stance as well as facilitation of stepping strategies to self-correct/stabilize posterior LOB and balance activities to challenge balance in staggered stance and with fwd/backward movements such as reaching and returning to neutral position. She required CGA/SBA of PT or presence of UE support surfaces for self-correction of LOB with most activities today but able to demonstrate improve stability with retro movements by end of session.    Comorbidities neuropathy; hypthyroidism; DDD; remote h/o back surgery at age 66; h/o scoliosis    Rehab Potential Good    PT Frequency 2x / week    PT Duration 6 weeks    PT Treatment/Interventions ADLs/Self Care Home Management;Electrical Stimulation;Iontophoresis 43m/ml Dexamethasone;Moist Heat;Ultrasound;Gait training;Stair training;Functional mobility training;Therapeutic activities;Therapeutic exercise;Balance training;Neuromuscular re-education;Patient/family education;Orthotic Fit/Training;Manual techniques;Passive range of motion;Dry needling;Taping;Joint Manipulations;Vestibular;Energy conservation    PT Next Visit Plan Proximal LE strengthening targeting hip ABD and ankle strength; Balance training & fall prevention; Postural ROM/strengthening; Progress cervical ROM as tolerated    PT  Home Exercise Plan MedBridge Access Code: [email or web address](4/12, updated 5/5, 5/10, 5/17, 5/24); ankle HEP: [email or web address] (6/15); [email or web address] (6/22)    Consulted and Agree with Plan of Care Patient             Patient will benefit from skilled therapeutic intervention in order to improve the following deficits and impairments:  Abnormal gait, Decreased activity tolerance, Decreased balance, Decreased coordination, Decreased endurance, Decreased knowledge of use of DME, Decreased mobility, Decreased range of motion, Decreased safety awareness, Decreased strength, Difficulty walking, Increased edema, Increased fascial restricitons, Increased muscle spasms, Impaired perceived functional ability, Impaired flexibility, Impaired sensation, Impaired UE functional use, Improper body mechanics, Postural dysfunction, Dizziness  Visit Diagnosis: Radiculopathy, cervical region  Muscle weakness (generalized)  Unsteadiness on feet  Other abnormalities of gait and mobility  Foot drop, left  Foot drop, right  Acute pain of left knee     Problem List Patient Active Problem List   Diagnosis Date Noted   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

## 2021-03-22 ENCOUNTER — Encounter: Payer: Medicare PPO | Admitting: Physical Therapy

## 2021-03-27 ENCOUNTER — Ambulatory Visit: Payer: Medicare PPO | Admitting: Physical Therapy

## 2021-03-27 ENCOUNTER — Other Ambulatory Visit: Payer: Self-pay

## 2021-03-27 ENCOUNTER — Encounter: Payer: Self-pay | Admitting: Physical Therapy

## 2021-03-27 DIAGNOSIS — R2689 Other abnormalities of gait and mobility: Secondary | ICD-10-CM

## 2021-03-27 DIAGNOSIS — M5412 Radiculopathy, cervical region: Secondary | ICD-10-CM | POA: Diagnosis not present

## 2021-03-27 DIAGNOSIS — M25562 Pain in left knee: Secondary | ICD-10-CM

## 2021-03-27 DIAGNOSIS — R2681 Unsteadiness on feet: Secondary | ICD-10-CM

## 2021-03-27 DIAGNOSIS — M21371 Foot drop, right foot: Secondary | ICD-10-CM

## 2021-03-27 DIAGNOSIS — M6281 Muscle weakness (generalized): Secondary | ICD-10-CM

## 2021-03-27 DIAGNOSIS — M21372 Foot drop, left foot: Secondary | ICD-10-CM

## 2021-03-27 NOTE — Therapy (Signed)
[name] Trujillo [address]  [place], [place], [address] Phone: [phone]   Fax:  [phone]  Physical Therapy Treatment  Patient Details  Name: [name] Trujillo MRN: [id number] Date of Birth: May 23, 1941 Referring Provider (PT): [name] Dew, NP for [name] Amber, MD   Encounter Date: 03/27/2021   PT End of Session - 03/27/21 1314     Visit Number 18    Number of Visits 23    Date for PT Re-Evaluation 04/06/21    Authorization Type [employer]    Authorization Time Period 02/21/21 - 04/06/21    Authorization - Visit Number 7    Authorization - Number of Visits 12    Progress Note Due on Visit --    PT Start Time 1314    PT Stop Time 1400    PT Time Calculation (min) 46 min    Activity Tolerance Patient tolerated treatment well    Behavior During Therapy [facility] for tasks assessed/performed             Past Medical History:  Diagnosis Date   High cholesterol    Hypothyroidism    Neuropathy     Past Surgical History:  Procedure Laterality Date   APPENDECTOMY     65-18 yo   BACK SURGERY     Age 8   CESAREAN SECTION     ECTOPIC PREGNANCY SURGERY     LAPAROSCOPIC HYSTERECTOMY      There were no vitals filed for this visit.   Subjective Assessment - 03/27/21 1320     Subjective Pt notes changing her foot position during the theraband rows has made a difference.    Pertinent History 09/21/20 - ACDF C5-6-7    Patient Stated Goals "to strengthen my neck muscles and improve the motion in my neck to help with my balance"    Currently in Pain? No/denies                               [facility] Adult PT Treatment/Exercise - 03/27/21 1314       Neuro Re-ed    Neuro Re-ed Details  alternating R/L fwd step onto Ariex pad with crossbody reach to cone atop 36" FR then return to neutral trying to avoid overcorrection with posterior LOB - cues to increase BOS as she returne to start position;  alternating R/L side-step with lateral and crossbody reaches to cone atop 36" FR then return to neutral      Knee/Hip Exercises: Aerobic   Recumbent Bike L3 x 6 min                 Balance Exercises - 03/27/21 1314       Balance Exercises: Standing   SLS Upper extremity support 1;4 reps   R/L SLS + opp foot ring flip and place on post (2 sets)                PT Short Term Goals - 01/16/21 1338       PT SHORT TERM GOAL #1   Title Patient will be independent with initial HEP    Status Achieved   01/16/21              PT Long Term Goals - 03/27/21 1321       PT LONG TERM GOAL #1   Title Patient will be independent with ongoing/advanced HEP for self-management at home  Status Partially Met   02/19/21 - met for current HEP   Target Date 04/06/21      PT LONG TERM GOAL #2   Title Patient to improve cervical AROM to [facility] without pain provocation    Status Partially Met   02/19/21 - met except B rotation remains functionally limited   Target Date 04/06/21      PT LONG TERM GOAL #3   Title Patient will demonstrate improved B shoulder strength to >/= 4 to 4+/5 for functional UE use    Status Partially Met   02/19/21 - met except L remains mildly weaker than R   Target Date 04/06/21      PT LONG TERM GOAL #4   Title Patient will demonstrate improved B hip strength to >/= 4/5 and B ankle strength to >/= 3+/5 to 4-/5  for improved stability and ease of mobility    Status Partially Met    Target Date 04/06/21      PT LONG TERM GOAL #5   Title Patient will ambulate with improved gait pattern with decreased B foot drop/slap and no evidence of instability/LOB    Status Partially Met   02/19/21 - Improved gait pattern but ongoing mild [place] hip drop and continued L>R foot slap   Target Date 04/06/21      PT LONG TERM GOAL #6   Title Patient will improve Berg score to >/= 46/56 to improve safety and stability with ADLs in standing and reduce risk for falls     Baseline 28/56    Status Partially Met   02/19/21 [name] Trujillo improved to 43/56   Target Date 04/06/21                   Plan - 03/27/21 1323     Clinical Impression Statement [name] Trujillo is nearing the end of her current POC and feels that she still needs to work on her balance, therefore therapeutic activities today targeting SLS stability and progression of stepping strategies to facilitate improved balance and balance reactions - PT assist needed to correct occasional LOB during SLS with ring manipulation with opposite foot but improved stability with stepping and reaching activities, although pt slightly more unsteady with increased need for UE support to stabilize when stepping and reaching to R. Will plan for formal balance and goal reassessment in upcoming visits to determine potential need for continued skilled PT.    Comorbidities neuropathy; hypthyroidism; DDD; remote h/o back surgery at age 38; h/o scoliosis    Rehab Potential Good    PT Frequency 2x / week    PT Duration 6 weeks    PT Treatment/Interventions ADLs/Self Care Home Management;Electrical Stimulation;Iontophoresis 20m/ml Dexamethasone;Moist Heat;Ultrasound;Gait training;Stair training;Functional mobility training;Therapeutic activities;Therapeutic exercise;Balance training;Neuromuscular re-education;Patient/family education;Orthotic Fit/Training;Manual techniques;Passive range of motion;Dry needling;Taping;Joint Manipulations;Vestibular;Energy conservation    PT Next Visit Plan Initiate balance & goal assessment in prep for recert vs D/C; Proximal LE strengthening targeting hip ABD and ankle strength; Balance training & fall prevention; Postural ROM/strengthening; Progress cervical ROM as tolerated    PT Home Exercise Plan MedBridge Access Code: [email or web address](4/12, updated 5/5, 5/10, 5/17, 5/24); ankle HEP: [email or web address] (6/15); [email or web address] (6/22)    Consulted and Agree with Plan of Care Patient             Patient will benefit from  skilled therapeutic intervention in order to improve the following deficits and impairments:  Abnormal gait, Decreased activity tolerance, Decreased balance, Decreased coordination, Decreased endurance, Decreased knowledge of use of  DME, Decreased mobility, Decreased range of motion, Decreased safety awareness, Decreased strength, Difficulty walking, Increased edema, Increased fascial restricitons, Increased muscle spasms, Impaired perceived functional ability, Impaired flexibility, Impaired sensation, Impaired UE functional use, Improper body mechanics, Postural dysfunction, Dizziness  Visit Diagnosis: Radiculopathy, cervical region  Muscle weakness (generalized)  Unsteadiness on feet  Other abnormalities of gait and mobility  Foot drop, left  Foot drop, right  Acute pain of left knee     Problem List Patient Active Problem List   Diagnosis Date Noted   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

## 2021-04-03 ENCOUNTER — Ambulatory Visit: Payer: Medicare PPO | Admitting: Physical Therapy

## 2021-04-03 ENCOUNTER — Other Ambulatory Visit: Payer: Self-pay

## 2021-04-03 ENCOUNTER — Encounter: Payer: Self-pay | Admitting: Physical Therapy

## 2021-04-03 DIAGNOSIS — M6281 Muscle weakness (generalized): Secondary | ICD-10-CM

## 2021-04-03 DIAGNOSIS — R2689 Other abnormalities of gait and mobility: Secondary | ICD-10-CM

## 2021-04-03 DIAGNOSIS — M21372 Foot drop, left foot: Secondary | ICD-10-CM

## 2021-04-03 DIAGNOSIS — M5412 Radiculopathy, cervical region: Secondary | ICD-10-CM

## 2021-04-03 DIAGNOSIS — M21371 Foot drop, right foot: Secondary | ICD-10-CM

## 2021-04-03 DIAGNOSIS — R2681 Unsteadiness on feet: Secondary | ICD-10-CM

## 2021-04-03 DIAGNOSIS — M25562 Pain in left knee: Secondary | ICD-10-CM

## 2021-04-03 NOTE — Patient Instructions (Signed)
    Access Code: [email or web address] URL: [email or web address] Date: 04/03/2021 Prepared by: Glenetta Hew  Exercises Seated Isometric Hip Abduction with Resistance - 1 x daily - 7 x weekly - 2 sets - 10 reps - 3 sec hold Seated Hip Internal Rotation with Ball and Resistance - 1 x daily - 7 x weekly - 2 sets - 10 reps - 3 sec hold Seated Rhomboid Stretch - 1 x daily - 7 x weekly - 3 reps - 30 sec hold

## 2021-04-03 NOTE — Therapy (Signed)
[facility] [address]  [place], [place], [address] Phone: [phone]   Fax:  [phone]  Physical Therapy Treatment  Patient Details  Name: [name] Trujillo MRN: [id number] Date of Birth: February 14, 1941 Referring Provider (PT): Dorena Dew, NP for [name], MD   Encounter Date: 04/03/2021   PT End of Session - 04/03/21 1315     Visit Number 19    Number of Visits 23    Date for PT Re-Evaluation 04/06/21    Authorization Type [employer]    Authorization Time Period 02/21/21 - 04/06/21    Authorization - Visit Number 8    Authorization - Number of Visits 12    PT Start Time 2060    PT Stop Time 1406    PT Time Calculation (min) 51 min    Activity Tolerance Patient tolerated treatment well    Behavior During Therapy [facility] for tasks assessed/performed             Past Medical History:  Diagnosis Date   High cholesterol    Hypothyroidism    Neuropathy     Past Surgical History:  Procedure Laterality Date   APPENDECTOMY     41-78 yo   BACK SURGERY     Age 80   CESAREAN SECTION     ECTOPIC PREGNANCY SURGERY     LAPAROSCOPIC HYSTERECTOMY      There were no vitals filed for this visit.   Subjective Assessment - 04/03/21 1319     Subjective Pt feels that she is getting stronger. She notes a ache between her shoulder blades for the last few nights following her evening walk.    Pertinent History 09/21/20 - ACDF C5-6-7    Patient Stated Goals "to strengthen my neck muscles and improve the motion in my neck to help with my balance"    Currently in Pain? No/denies                [facility] PT Assessment - 04/03/21 1315       Assessment   Medical Diagnosis Cervical ACDF C5-6-7    Referring Provider (PT) Dorena Dew, NP for [name], MD    Onset Date/Surgical Date 09/21/20    Hand Dominance Right    Next MD Visit 04/11/21      AROM   Cervical Flexion 53    Cervical Extension 54     Cervical - Right Side Bend 32    Cervical - Left Side Bend 30    Cervical - Right Rotation 58    Cervical - Left Rotation 56      Strength   Right Shoulder Flexion 4+/5    Right Shoulder ABduction 4+/5    Right Shoulder Internal Rotation 5/5    Right Shoulder External Rotation 4+/5    Left Shoulder Flexion 4+/5    Left Shoulder ABduction 4+/5    Left Shoulder Internal Rotation 5/5    Left Shoulder External Rotation 4+/5    Right Elbow Flexion 5/5    Right Elbow Extension 4+/5    Left Elbow Flexion 5/5    Left Elbow Extension 4+/5    Right Hand Grip (lbs) 29.3   32, 30, 26   Left Hand Grip (lbs) 18   20, 18, 16   Right Hip Flexion 4/5    Right Hip Extension 4/5    Right Hip External Rotation  4-/5    Right Hip Internal Rotation 4/5    Right  Hip ABduction 4/5    Right Hip ADduction 4/5    Left Hip Flexion 4+/5    Left Hip Extension 4/5    Left Hip External Rotation 4-/5    Left Hip Internal Rotation 4/5    Left Hip ABduction 4/5    Left Hip ADduction 4/5    Right Knee Flexion 5/5    Right Knee Extension 5/5    Left Knee Flexion 5/5    Left Knee Extension 5/5    Right Ankle Dorsiflexion 3+/5    Right Ankle Plantar Flexion 3/5   unable to initiate single leg heel raise; 4/5 with manual resistance   Right Ankle Inversion 3+/5    Right Ankle Eversion 4-/5    Left Ankle Dorsiflexion 3/5    Left Ankle Plantar Flexion 3/5   unable to initiate single leg heel raise; 4/5 with manual resistance   Left Ankle Inversion 2-/5    Left Ankle Eversion 3+/5                           [facility] Adult PT Treatment/Exercise - 04/03/21 1315       Knee/Hip Exercises: Aerobic   Recumbent Bike L3 x 6 min      Knee/Hip Exercises: Seated   Clamshell with TheraBand Red   alt hip ABD/ER 10 x 3"   Other Seated Knee/Hip Exercises R/L yellow TB hip IR clam + ball squeeze 10 x 3"      Neck Exercises: Stretches   Other Neck Stretches B rhomboid stretch 2 x 30 sec                     PT Education - 04/03/21 1405     Education Details HEP review & update - Access Code: [email or web address]    Person(s) Educated Patient    Methods Explanation;Demonstration;Verbal cues;Handout    Comprehension Verbalized understanding;Verbal cues required;Returned demonstration;Need further instruction              PT Short Term Goals - 01/16/21 1338       PT SHORT TERM GOAL #1   Title Patient will be independent with initial HEP    Status Achieved   01/16/21              PT Long Term Goals - 04/03/21 1325       PT LONG TERM GOAL #1   Title Patient will be independent with ongoing/advanced HEP for self-management at home    Status Partially Met   02/19/21 - met for current HEP     PT LONG TERM GOAL #2   Title Patient to improve cervical AROM to [facility] without pain provocation    Status Achieved   04/03/21     PT LONG TERM GOAL #3   Title Patient will demonstrate improved B shoulder strength to >/= 4 to 4+/5 for functional UE use    Status Achieved   04/03/21     PT LONG TERM GOAL #4   Title Patient will demonstrate improved B hip strength to >/= 4/5 and B ankle strength to >/= 3+/5 to 4-/5  for improved stability and ease of mobility    Status Partially Met      PT LONG TERM GOAL #5   Title Patient will ambulate with improved gait pattern with decreased B foot drop/slap and no evidence of instability/LOB    Status Partially Met   02/19/21 - Improved gait pattern but ongoing  mild [place] hip drop and continued L>R foot slap     PT LONG TERM GOAL #6   Title Patient will improve Berg score to >/= 46/56 to improve safety and stability with ADLs in standing and reduce risk for falls    Baseline 28/56    Status Partially Met   02/19/21 [name] improved to 43/56                  Plan - 04/03/21 1406     Clinical Impression Statement - Adalia continues to demonstrate good progress with PT s/p cervical fusion. Her cervical ROM is now [facility], and B shoulder  strength is now 4+/5 to 5/5, although mild grip weakness still evident in L hand - LTGs #2 & 3 met. Her LE strength is also improving, although mild weakness still evident proximally at hips with more pronounced ongoing weakness present in L>R ankle - LTG #4 partially met. HEP exercises reviewed emphasizing prioritization of ongoing strengthening with new instructions provided for hip rotator strengthening and rhomboid stretch. She notes improved gait stability with decreased reliance on walking pole as AD, however B foot slap still evident along with some lateral hip instability. Will plan for formal balance reassessment next visit and anticipate recommendation for recert as pt feels that she has further potential to benefit from PT.    Comorbidities neuropathy; hypthyroidism; DDD; remote h/o back surgery at age 10; h/o scoliosis    Rehab Potential Good    PT Frequency 2x / week    PT Duration 6 weeks    PT Treatment/Interventions ADLs/Self Care Home Management;Electrical Stimulation;Iontophoresis 4mg /ml Dexamethasone;Moist Heat;Ultrasound;Gait training;Stair training;Functional mobility training;Therapeutic activities;Therapeutic exercise;Balance training;Neuromuscular re-education;Patient/family education;Orthotic Fit/Training;Manual techniques;Passive range of motion;Dry needling;Taping;Joint Manipulations;Vestibular;Energy conservation    PT Next Visit Plan Complete balance & goal assessment with anticipated recert for continued: Proximal LE strengthening targeting hip ABD and ankle strength; Balance training & fall prevention; Postural ROM/strengthening; Progress cervical ROM as tolerated    PT Home Exercise Plan MedBridge Access Code: [email or web address] (4/12, updated 5/5, 5/10, 5/17, 5/24); ankle HEP: [email or web address] (6/15); [email or web address] (6/22), [email or web address] (7/26)    Consulted and Agree with Plan of Care Patient             Patient will benefit from skilled therapeutic intervention in order to improve the following  deficits and impairments:  Abnormal gait, Decreased activity tolerance, Decreased balance, Decreased coordination, Decreased endurance, Decreased knowledge of use of DME, Decreased mobility, Decreased range of motion, Decreased safety awareness, Decreased strength, Difficulty walking, Increased edema, Increased fascial restricitons, Increased muscle spasms, Impaired perceived functional ability, Impaired flexibility, Impaired sensation, Impaired UE functional use, Improper body mechanics, Postural dysfunction, Dizziness  Visit Diagnosis: Radiculopathy, cervical region  Muscle weakness (generalized)  Unsteadiness on feet  Other abnormalities of gait and mobility  Foot drop, left  Foot drop, right  Acute pain of left knee     Problem List Patient Active Problem List   Diagnosis Date Noted   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  [place], [place], [address] Phone: [phone]   Fax:  [phone]  Name: Taneah Masri MRN: [id number] Date of Birth: 03/21/1941

## 2021-04-05 ENCOUNTER — Ambulatory Visit
Admission: RE | Admit: 2021-04-05 | Discharge: 2021-04-05 | Disposition: A | Discharge status: Home / Self Care | Payer: Medicare PPO | Source: Ambulatory Visit | Attending: Family Medicine | Admitting: Family Medicine

## 2021-04-05 ENCOUNTER — Other Ambulatory Visit: Payer: Self-pay

## 2021-04-05 ENCOUNTER — Ambulatory Visit: Payer: Medicare PPO | Admitting: Physical Therapy

## 2021-04-05 ENCOUNTER — Encounter: Payer: Self-pay | Admitting: Physical Therapy

## 2021-04-05 ENCOUNTER — Other Ambulatory Visit: Payer: Self-pay | Admitting: Family Medicine

## 2021-04-05 DIAGNOSIS — R2681 Unsteadiness on feet: Secondary | ICD-10-CM

## 2021-04-05 DIAGNOSIS — Z981 Arthrodesis status: Secondary | ICD-10-CM

## 2021-04-05 DIAGNOSIS — M5412 Radiculopathy, cervical region: Secondary | ICD-10-CM | POA: Diagnosis not present

## 2021-04-05 DIAGNOSIS — M6281 Muscle weakness (generalized): Secondary | ICD-10-CM

## 2021-04-05 DIAGNOSIS — M21371 Foot drop, right foot: Secondary | ICD-10-CM

## 2021-04-05 DIAGNOSIS — M21372 Foot drop, left foot: Secondary | ICD-10-CM

## 2021-04-05 DIAGNOSIS — R2689 Other abnormalities of gait and mobility: Secondary | ICD-10-CM

## 2021-04-05 NOTE — Therapy (Signed)
[facility] [address]  [place], [place], [address] Phone: [phone]   Fax:  [phone]  Physical Therapy Treatment / Recert  Patient Details  Name: [name] Trujillo MRN: [id number] Date of Birth: 05-Dec-1940 Referring Provider (PT): Dorena Dew, NP for [name], MD  Progress Note  Reporting Period 02/19/2021 to 04/05/2021  See note below for Objective Data and Assessment of Progress/Goals.     Encounter Date: 04/05/2021   PT End of Session - 04/05/21 1312     Visit Number 20    Number of Visits 28    Date for PT Re-Evaluation 05/31/21    Authorization Type [employer]    Authorization Time Period 02/21/21 - 04/06/21    Authorization - Visit Number 9    Authorization - Number of Visits 12    PT Start Time 1312    PT Stop Time 1402    PT Time Calculation (min) 50 min    Activity Tolerance Patient tolerated treatment well    Behavior During Therapy WFL for tasks assessed/performed             Past Medical History:  Diagnosis Date   High cholesterol    Hypothyroidism    Neuropathy     Past Surgical History:  Procedure Laterality Date   APPEN[age]CTOMY     [age] yo   BACK SURGERY     Age 80   CESAREAN SECTION     ECTOPIC PREGNANCY SURGERY     LAPAROSCOPIC HYSTERECTOMY      There were no vitals filed for this visit.   Subjective Assessment - 04/05/21 1313     Subjective Pt expressing interest in continuing with PT for further LE strengthening and balance. She feels that her rehab for her neck has done well - her neck "feels like my normal neck".    Pertinent History 09/21/20 - ACDF C5-6-7    Patient Stated Goals "to strengthen my neck muscles and improve the motion in my neck to help with my balance"    Currently in Pain? No/denies                [facility] PT Assessment - 04/05/21 1312       Assessment   Medical Diagnosis Cervical ACDF C5-6-7; Unsteadiness of feet/Gait  instability    Referring Provider (PT) Dorena Dew, NP for [name], MD    Onset Date/Surgical Date 09/21/20    Hand Dominance Right    Next MD Visit 04/11/21      Prior Function   Level of Independence Independent    Vocation Retired    Leisure walking 2x./day for total of ~1.5 miles; working in the yard; sewing/quilting      Strength   Right Shoulder Flexion 4+/5    Right Shoulder ABduction 4+/5    Right Shoulder Internal Rotation 5/5    Right Shoulder External Rotation 4+/5    Left Shoulder Flexion 4+/5    Left Shoulder ABduction 4+/5    Left Shoulder Internal Rotation 5/5    Left Shoulder External Rotation 4+/5    Right Elbow Flexion 5/5    Right Elbow Extension 4+/5    Left Elbow Flexion 5/5    Left Elbow Extension 4+/5    Right Hand Grip (lbs) 29.3   32, 30, 26   Left Hand Grip (lbs) 18   20, 18, 16   Right Hip Flexion 4/5    Right Hip Extension 4/5  Right Hip External Rotation  4-/5    Right Hip Internal Rotation 4/5    Right Hip ABduction 4/5    Right Hip ADduction 4/5    Left Hip Flexion 4+/5    Left Hip Extension 4/5    Left Hip External Rotation 4-/5    Left Hip Internal Rotation 4/5    Left Hip ABduction 4/5    Left Hip ADduction 4/5    Right Knee Flexion 5/5    Right Knee Extension 5/5    Left Knee Flexion 5/5    Left Knee Extension 5/5    Right Ankle Dorsiflexion 3+/5    Right Ankle Plantar Flexion 3/5   unable to initiate single leg heel raise; 4/5 with manual resistance   Right Ankle Inversion 3+/5    Right Ankle Eversion 4-/5    Left Ankle Dorsiflexion 3/5    Left Ankle Plantar Flexion 3/5   unable to initiate single leg heel raise; 4/5 with manual resistance   Left Ankle Inversion 2-/5    Left Ankle Eversion 3+/5      Ambulation/Gait   Gait velocity 2.49 ft/sec w/o AD;  2.55 ft/sec with hiking pole      Standardized Balance Assessment   Five times sit to stand comments  14.85 sec   with no LOB   10 Meter Walk 13.16 sec w/o AD; 12.84 sec  with hiking pole      Berg Balance Test   Sit to Stand Able to stand without using hands and stabilize independently    Standing Unsupported Able to stand safely 2 minutes    Sitting with Back Unsupported but Feet Supported on Floor or Stool Able to sit safely and securely 2 minutes    Stand to Sit Sits safely with minimal use of hands    Transfers Able to transfer safely, minor use of hands    Standing Unsupported with Eyes Closed Able to stand 10 seconds with supervision    Standing Unsupported with Feet Together Able to place feet together independently and stand 1 minute safely    From Standing, Reach Forward with Outstretched Arm Can reach forward >12 cm safely (5")    From Standing Position, Pick up Object from Floor Able to pick up shoe safely and easily    From Standing Position, Turn to Look Behind Over each Shoulder Looks behind one side only/other side shows less weight shift    Turn 360 Degrees Able to turn 360 degrees safely one side only in 4 seconds or less    Standing Unsupported, Alternately Place Feet on Step/Stool Able to stand independently and complete 8 steps >20 seconds    Standing Unsupported, One Foot in Front Able to take small step independently and hold 30 seconds    Standing on One Leg Tries to lift leg/unable to hold 3 seconds but remains standing independently    Total Score 46    Berg comment: 46-51 moderate fall risk (>50%)      Timed Up and Go Test   Normal TUG (seconds) 11.69   w/o AD; 11.69 sec with hiking pole     Functional Gait  Assessment   Gait Level Surface Walks 20 ft, slow speed, abnormal gait pattern, evidence for imbalance or deviates 10-15 in outside of the 12 in walkway width. Requires more than 7 sec to ambulate 20 ft.    Change in Gait Speed Able to change speed, demonstrates mild gait deviations, deviates 6-10 in outside of the 12 in  walkway width, or no gait deviations, unable to achieve a major change in velocity, or uses a change in  velocity, or uses an assistive device.    Gait with Horizontal Head Turns Performs head turns with moderate changes in gait velocity, slows down, deviates 10-15 in outside 12 in walkway width but recovers, can continue to walk.    Gait with Vertical Head Turns Performs task with slight change in gait velocity (eg, minor disruption to smooth gait path), deviates 6 - 10 in outside 12 in walkway width or uses assistive device    Gait and Pivot Turn Pivot turns safely in greater than 3 sec and stops with no loss of balance, or pivot turns safely within 3 sec and stops with mild imbalance, requires small steps to catch balance.    Step Over Obstacle Is able to step over 2 stacked shoe boxes taped together (9 in total height) without changing gait speed. No evidence of imbalance.    Gait with Narrow Base of Support Ambulates 4-7 steps.    Gait with Eyes Closed Cannot walk 20 ft without assistance, severe gait deviations or imbalance, deviates greater than 15 in outside 12 in walkway width or will not attempt task.    Ambulating Backwards Walks 20 ft, uses assistive device, slower speed, mild gait deviations, deviates 6-10 in outside 12 in walkway width.    Steps Alternating feet, must use rail.    Total Score 16    FGA comment: < 19 = high risk fall                           [facility] Adult PT Treatment/Exercise - 04/05/21 1312       Knee/Hip Exercises: Aerobic   Recumbent Bike L3 x 6 min                      PT Short Term Goals - 01/16/21 1338       PT SHORT TERM GOAL #1   Title Patient will be independent with initial HEP    Status Achieved   01/16/21              PT Long Term Goals - 04/05/21 1317       PT LONG TERM GOAL #1   Title Patient will be independent with ongoing/advanced HEP for self-management at home    Status Partially Met   05/06/21 - met for current HEP   Target Date 05/31/21      PT LONG TERM GOAL #2   Title Patient to improve cervical  AROM to [facility] without pain provocation    Status Achieved   04/03/21     PT LONG TERM GOAL #3   Title Patient will demonstrate improved B shoulder strength to >/= 4 to 4+/5 for functional UE use    Status Achieved   04/03/21     PT LONG TERM GOAL #4   Title Patient will demonstrate improved B hip strength to >/= 4/5 and B ankle strength to >/= 3+/5 to 4-/5  for improved stability and ease of mobility    Status Partially Met    Target Date 05/31/21      PT LONG TERM GOAL #5   Title Patient will ambulate with improved gait pattern with decreased B foot drop/slap and no evidence of instability/LOB    Status Partially Met   04/05/21 - Improved gait pattern and stability with decreasing dependence on AD,  but ongoing mild [place]rend[place]burg hip drop and continued L>R foot slap   Target Date 05/31/21      PT LONG TERM GOAL #6   Title Patient will improve Berg score to >/= 50/56 to improve safety and stability with ADLs in standing and reduce risk for falls    Baseline 28/56 on eval, 46/56 as of 04/05/21   baseline as of D/C from PT episode in March 2021 = 52/56   Status Revised   04/05/21 - [name] Trujillo improved to 46/56 (original LTG of 46/56 met & revised to reflect potential for further improvment)   Target Date 05/31/21      PT LONG TERM GOAL #7   Title Patient will increase gait speed to >/= 2.62 ft/sec to increase safety with community ambulation    Baseline 2.49 ft/sec w/o AD and 2.55 ft/sec with hiking pole as of 04/05/21    Status New    Target Date 05/31/21      PT LONG TERM GOAL #8   Title Patient will improve FGA score to >/= 20/30 to improve gait stability and reduce risk for falls    Baseline 16/30   baseline as of D/C from PT episode in March 2021 = 23/30   Status New    Target Date 05/31/21                   Plan - 04/05/21 1402     Clinical Impression Statement [name] Trujillo is very pleased with her rehab progress following her cervical fusion, noting improved posture, functional  cervical ROM and functional UE strength. Her cervical ROM is now [facility], and B shoulder strength is now 4+/5 to 5/5, although mild grip weakness still evident in L hand - LTGs #2 & 3 met. Her LE strength is also improving, although mild weakness still evident proximally at hips with more pronounced ongoing weakness present in L>R ankle - LTG #4 partially met. Her gait speed is currently 2.49 ft/sec w/o AD and 2.55 ft/sec with hiking pole. Trendelenburg hip drop and lateral instability as well as B foot slap evident with gait as a result of ongoing LE weakness, although she reports improved gait stability with decreased reliance on walking pole as AD. Balance has improved per all standardized testing with 5xSTS decreased to 14.85 sec (w/o any instability noted) from 22.81 sec (with patient requiring 1-2 forward steps to right her balance with each sit to stand), Berg increased from 28/56 to 46/56 indicating a reduced fall risk to moderate (>50%) from high (close to 100%), and TUG reduced from 15.25 sec w/o AD to 11.69 sec (with & w/o walking pole). Despite gains, her balance is not back to her baseline from what she had achieved at the end of her last PT episode in March 2021 at which time her gait speed was 2.99 ft/sec and she scored 52/56 on the Berg and 23/30 on the FGA - FGA currently 16/30. Vance is very motivated and has good potential to continue to benefit from additional skilled PT to address ongoing LE strength and balance deficits to further reduce her fall risk and increase safety with gait, therefore will recommend recert for additional 8 weeks with frequency reduced to 1x/wk.    Personal Factors and Comorbidities Time since onset of injury/illness/exacerbation;Past/Current Experience;Comorbidity 3+;Age    Comorbidities neuropathy; hypthyroidism; DDD; remote h/o back surgery at age 10; h/o scoliosis    Examination-Activity Limitations Bathing;Bend;Caring for Others;Carry;Dressing;Lift;Locomotion  Level;Squat;Stairs;Stand;Transfers    Examination-Participation Restrictions Cleaning;Community Activity;Driving;Laundry;Meal Prep;Shop;Yard Work  Rehab Potential Good    PT Frequency 1x / week    PT Duration 8 weeks    PT Treatment/Interventions ADLs/Self Care Home Management;Electrical Stimulation;Iontophoresis 84m/ml Dexamethasone;Moist Heat;Ultrasound;Gait training;Stair training;Functional mobility training;Therapeutic activities;Therapeutic exercise;Balance training;Neuromuscular re-education;Patient/family education;Orthotic Fit/Training;Manual techniques;Passive range of motion;Dry needling;Taping;Joint Manipulations;Vestibular;Energy conservation    PT Next Visit Plan Postural/core strengthening; Proximal LE strengthening targeting hip ABD and ankle strengthening; Balance training & fall prevention    PT Home Exercise Plan [employer] Access Codes: neck HEP - [id number](01-09-2023 updated 5/5, 5/10, 5/17 & 5/24), [id number] (6/22); ankle HEP - [id number] (6/15); hip - [id number](7/26)    Consulted and Agree with Plan of Care Patient             Patient will benefit from skilled therapeutic intervention in order to improve the following deficits and impairments:  Abnormal gait, Decreased activity tolerance, Decreased balance, Decreased coordination, Decreased endurance, Decreased knowledge of use of DME, Decreased mobility, Decreased range of motion, Decreased safety awareness, Decreased strength, Difficulty walking, Increased edema, Increased fascial restricitons, Increased muscle spasms, Impaired perceived functional ability, Impaired flexibility, Impaired sensation, Impaired UE functional use, Improper body mechanics, Postural dysfunction, Dizziness  Visit Diagnosis: Unsteadiness on feet  Other abnormalities of gait and mobility  Muscle weakness (generalized)  Foot drop, left  Foot drop, right     Problem List Patient Active Problem List   Diagnosis Date Noted   Idiopathic  progressive neuropathy 04/06/2020   Ventricular premature beats 10/31/2015   Heart murmur 10/31/2015    JPercival Spanish PT, MPT 04/05/2021, 7:05 PM  [facility] [address] [place] [address]Phone: [phone]  Fax:  [phone] Name: [name] LangeMRN: [id number] of Birth: [birth year]

## 2021-04-10 ENCOUNTER — Encounter (INDEPENDENT_AMBULATORY_CARE_PROVIDER_SITE_OTHER): Payer: Self-pay | Admitting: Neurology

## 2021-04-10 ENCOUNTER — Ambulatory Visit (INDEPENDENT_AMBULATORY_CARE_PROVIDER_SITE_OTHER): Payer: Medicare PPO | Admitting: Neurology

## 2021-04-10 ENCOUNTER — Telehealth (INDEPENDENT_AMBULATORY_CARE_PROVIDER_SITE_OTHER): Payer: Self-pay

## 2021-04-10 VITALS — BP 132/69 | HR 80 | Ht 61.0 in | Wt 123.0 lb

## 2021-04-10 DIAGNOSIS — R200 Anesthesia of skin: Secondary | ICD-10-CM | POA: Insufficient documentation

## 2021-04-10 DIAGNOSIS — Z9889 Other specified postprocedural states: Secondary | ICD-10-CM

## 2021-04-10 DIAGNOSIS — M21372 Foot drop, left foot: Secondary | ICD-10-CM

## 2021-04-10 DIAGNOSIS — M21371 Foot drop, right foot: Secondary | ICD-10-CM

## 2021-04-10 DIAGNOSIS — M4802 Spinal stenosis, cervical region: Secondary | ICD-10-CM

## 2021-04-10 NOTE — Progress Notes (Signed)
Subjective:      Patient ID: Stephanie Castaneda is a 80 y.o. female.      HPI  This is a 80 year old woman who was been referred here by her neurosurgeon for numbness in her legs.    The patient states that she lives in [place] but comes here for medical care because her daughter who is a physician lives in this area.    When she was 80 years of age she needed a back surgery for a ruptured disc which had calcified.  At that time she had excruciating back pain, she needed a myelogram for testing.  Around 2018 the patient started noticing numbness in the left leg, starting in the thigh and going down the leg.  She had detailed testing at [facility], it showed peripheral neuropathy for which she had detailed blood work which did not reveal any etiology.  The patient saw another neurologist in this area and was diagnosed with bilateral foot drop.  At that time she was also diagnosed with severe cervical spinal stenosis and she saw Dr. Marlowe Kays neurosurgery.  She had surgery and noticed significant improvement in her hand symptoms.  She was referred here by neurosurgery for numbness in the legs.  She started noticing numbness on the right thigh in 2021.  She has noticed worsening of the left foot drop as well.    Relevant epic records were reviewed.  She had an MRI of the lumbar spine in November 2021 which showed degenerative changes.  She has an EMG from [facility] in epic.    The following portions of the patient's history were reviewed and updated as appropriate: allergies, current medications, past family history, past medical history, past social history, past surgical history and problem list.    Review of Systems  Reviewed all systems and negative other than what is mentioned above.  Objective:   Neurologic Exam  Vital Signs:  BP 132/69 (BP Site: Left arm, Patient Position: Sitting, Cuff Size: Small)   Pulse 80   Ht 1.549 m (5\' 1" )   Wt 55.8 kg (123 lb)   BMI 23.24 kg/m     General: The  patient was well developed and well nourished.  No acute distress. Cooperative with the exam.  Extremities:  extremities normal in color    Mental Status: The patient was awake, alert and oriented to person, place, and time.  Affect is normal.  Attention span and concentration appear normal.  Language function is normal. There is no evidence of aphasia in conversational speech.    Cranial nerves:   -CN II: Visual fields full to bedside confrontation   -CN III, IV, VI: Pupils equal, round, and reactive to light; extraocular movements intact; no ptosis              -CN V: Facial sensation intact in V1 through V3 distributions   -CN VII: Face symmetric   -CN VIII: Hearing intact to conversational speech   -CN IX, X: Palate elevates symmetrically; normal phonation   -CN XII: Tongue protrudes midline    Motor: Muscle tone normal without spasticity or flaccidity. No atrophy.  No pronator drift.  Strength  R / L    R / L  Deltoid  5 / 5  Hip Flexion 5 / 5  Triceps  5 / 5     Biceps  5 / 5   Knee flexion 5 / 5  Wrist ext 5 / 5  Knee ext 5 /  5  Wrist flexion 5 / 5  Dorsiflexion 4+ / 4-  FF   5 / 5      Sensory:   Light touch sensation reduced on legs bilaterally.    Reflexes:  Hyporeflexic bilaterally    Coordination: FTN intact. No tremors.    Gait: Walks with a cane, noticeable left foot drop.    Assessment:     See below.    Plan:     Impression and plan:  This is a 80 year old woman with history of surgery for cervical spinal stenosis, history of low back surgery in the past who has been referred here for numbness in both legs and foot drop.  Patient is aware of the symptoms for the past few years and has noticed gradually worsening.  Per patient she has had an EMG and a blood work done and no specific etiology has been found.  Patient is going to try and arrange the blood work results and EMG results to be sent to [place].  The patient has had an MRI of the lumbar spine in November 2021.  Would recommend follow-up with her  neurosurgeon.  The etiology of her symptoms is probably multifactorial.  Patient has done physical therapy in the past.    1. Numbness in both legs    2. Bilateral foot-drop    3. History of back surgery    4. Cervical stenosis of spinal canal    Follow up after tests. Further plans will be made as test results are available. The patient will call if there are any questions before that. The patient should seek emergent care if there is any change in the symptoms.     Gwenevere Abbot, MD - [employer] [facility]   Board Certified in Neurology by ABPN   Board Certified Clinical Neurophysiology by ABPN   Board Certified in Electrodiagnostic Medicine by ABEM

## 2021-04-10 NOTE — Telephone Encounter (Signed)
Attempted to call patient to confirm appt with XR.  Patient already obtained XR and will be bringing in disk but no report.  Patient had them done in [place] as she is a resident there.    Appt confirmed with XR disk only.    3:02PM ET  04/10/21  Tyson Babinski, MA

## 2021-04-11 ENCOUNTER — Other Ambulatory Visit: Payer: Self-pay

## 2021-04-11 ENCOUNTER — Encounter (INDEPENDENT_AMBULATORY_CARE_PROVIDER_SITE_OTHER): Payer: Self-pay | Admitting: Neurological Surgery

## 2021-04-11 ENCOUNTER — Encounter (INDEPENDENT_AMBULATORY_CARE_PROVIDER_SITE_OTHER): Payer: Self-pay

## 2021-04-11 ENCOUNTER — Ambulatory Visit (INDEPENDENT_AMBULATORY_CARE_PROVIDER_SITE_OTHER): Payer: Medicare PPO | Admitting: Neurological Surgery

## 2021-04-11 VITALS — BP 147/66 | HR 87 | Resp 18 | Ht 61.0 in | Wt 123.8 lb

## 2021-04-11 DIAGNOSIS — M5416 Radiculopathy, lumbar region: Secondary | ICD-10-CM

## 2021-04-11 DIAGNOSIS — Z981 Arthrodesis status: Secondary | ICD-10-CM

## 2021-04-11 NOTE — Progress Notes (Addendum)
[facility] Neurosurgery  Follow up Note    Previous Impression/Plan     Stephanie Castaneda is a 80 year-old female three months s/p C5-7 ACDF. I personally reviewed the x-ray studies of the cervical spine showing a stable instrumented fusion without evidence of hardware failure or malalignment. Will refer to PT, improve posture. Continue bone stimulator and calcitonin nasal spray. She will follow-up in three months with repeat xray studies of the cervical spine. Cleared to work in the yard.    HPI     Chief Complaint   Patient presents with    Follow-up     DOS 09/21/20  Status post cervical spinal fusion  XR C-SPINE     Stephanie Castaneda is a 24 year-old female following up today six months after a C5-7 ACDF. She does not endorse any neck pain today. Her hand strength and sensation in the hands has improved considerably. She has returned to her quilting hobby without any issues. VAS 0/10 for neck pain.    She has had an ongoing issue with gait instability, numbness in both of her legs. At the age of 25, she had a right microdiscectomy and laminectomy for a disc herniation. However, over the last few years she has had worsening numbness in both of her legs with progressive weakness in the left foot. She has completed multiple courses of physical therapy in attempt to address her issue without relief. Her left foot drags on the ground when she walks. Uses a walking stick to maintain her balance to prevent falls. She was evaluated by Dr. Lonia Mad who suspected lumbar stenosis, referred the patient for an MRI of the lumbar spine which she has for review today. The weakness, numbness, and gait instability has significantly impacted her quality of life and limits her ability to complete activities of daily living.    Physical Examination   VITAL SIGNS:   height is 1.549 m (5\' 1" ) and weight is 56.2 kg (123 lb 12.8 oz). Her blood pressure is 147/66 and her pulse is 87. Her respiration is 18.        Awake, alert,  oriented x3, Follows commands  GCS: 15   Speech is clear  Attention span and concentration: intact  Recent and remote memory: intact    Gait Intact  Able to walk on toes and heels    Point tenderness: None     Motor Exam:   R L   Deltoid    5 5  Tricep    5 5  Bicep    5 5  Wrist Extension  5 5  Wrest Flexion   5 5  Finger Abduction  5 5  Grip    5 5    L2-3     Iliopsoas (Hip Flexion)                        5          5  L3-4     Quadriceps (Knee Extension)             5          5  L5-S1   Hamstring (Knee Flexion)                   5          5  L4-5     Tibialis Anterior (Foot Dorsiflexion)    5  4  S1        Gastrocsoleus (Plantar Flexion)         5          5  L5        EHL (Toe Dorsiflexion)                       5          4    Rapid alternating movements intact    Decreased sensation to LT and PP along the RLE from the hip to the foot, and decreased sensation LLE foot     Reflexes:  DTRs symmetric, 1+ throughout      Clonus: right negative, left negative   Babinski: right negative, left negative   Hoffman's: right  negative, left negative     Review of Systems   A review of systems is otherwise negative for all systems other than what has been indicated in the HPI.      Radiology Interpretation   X-ray cervical spine    Stable instrumented anterior cervical fusion without evidence of hardware failure or malalignment at C5 to C7.       MRI LUMBAR SPINE WITHOUT AND WITH CONTRAST     HISTORY: Bilateral leg numbness 6 cc Gadavist injected, postop   COMPARISON: None.     TECHNIQUE: Routine  MRI lumbar spine with and without contrast. GADAVIST 6  cc injected.   FINDINGS:  At L4-5: Postoperative changes noted, with findings of right laminotomy.  Minimal residual broad disc osteophyte complex evident without recurrent  stenosis or disc herniation, with degenerative crowding of both foramen. No  evidence of abnormal fluid collection. No evidence of recurrent stenosis.     There is normal vertebral body height  without evidence of acute fracture.      The marrow signal intensity of the bones are normal.      Segmental analysis as below:  At L1-L2: L1-L2 moderate to severe disc degeneration, with 3 mm  degenerative retrolisthesis.Minimal osteophyte, with slightly more focal  right foraminal disc osteophyte complex which may cause right foraminal  encroachment.  At L2-L3: L2-L3 moderate disc degeneration with 3 mm degenerative  retrolisthesis. Broad osteophyte and facet/ligamentum flavum thickening  causing degenerative crowding of both lateral recess with slight crowded  appearance, with degenerative crowding of both foramen at this level.  At L3-L4: Minimal osteophyte with broad annular bulge, causing slight  degenerative crowding of both lateral recess. L3-L4 bilateral facet  degeneration evident with component of mild acute inflammatory facet  arthropathy with increased synovial enhancement and minimal edema of the  marrow.   At L4-L5: Postop as noted above.  At L5-S1: Left foraminal disc osteophyte causing left foraminal stenosis.     IMPRESSION:       1.  L4-5 postoperative changes as noted above.     2.  Degenerative changes of lumbar spine as noted above.      Electronically signed by: Trinna Post (Einar Pheasant M.D.  [Interpreted at: 22]  [facility]     AL: 07/28/20  Impression/Plan     Stephanie Castaneda is a 32 year-old female six months s/p C5-7 ACDF. I personally reviewed the x-ray studies of the cervical spine, demonstrating a stable instrumented fusion without evidence of hardware failure or malalignment.  She will plan to follow-up in six months with repeat x-ray studies of the cervical spine.    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 We will repeat the MRI of the lumbar for an updated evaluation along with an EMG/NCV to  assess lumbar radiculopathy. She will follow-up with me upon completion of the MRI of the Lumbar spine and EMG/NCV.  Orders Placed This Encounter   Procedures    MRI Lumbar Spine WO Contrast     Standing Status:   Future     Standing Expiration Date:   04/11/2022     Order Specific Question:   Does the patient have a pacemaker or defibrillator?     Answer:   No     Order Specific Question:   Clinical info for radiologist     Answer:   lumbar spondylosis     Order Specific Question:   Release to patient     Answer:   Immediate    Referral to Neurology: Outside Organization (EXTERNAL)     Referral Priority:   Routine     Referral Type:   Consultation     Referral Reason:   Specialty Services Required     Referred to Provider:   Fidel Levy, MD     Number of Visits Requested:   1     Follow-up   After MRI Lumbar and EMG/NCV of BLE    In six months with repeat x-ray studies of the cervical spine.    All relevant and clinical information was transcribed by me, Rondel Oh, NP-C, acting as a scribe for Dr. [name].    I, [name], MD, personally performed the services documented. Rondel Oh, NP is scribing for me for this patient. This note and the patient instructions accurately reflect work and decisions made by me, [name], MD.    Marlaine Hind, MD

## 2021-04-12 ENCOUNTER — Other Ambulatory Visit: Payer: Self-pay

## 2021-04-12 ENCOUNTER — Ambulatory Visit: Payer: Medicare PPO | Attending: Neonatology | Admitting: Physical Therapy

## 2021-04-12 DIAGNOSIS — M21372 Foot drop, left foot: Secondary | ICD-10-CM | POA: Diagnosis present

## 2021-04-12 DIAGNOSIS — R2689 Other abnormalities of gait and mobility: Secondary | ICD-10-CM | POA: Insufficient documentation

## 2021-04-12 DIAGNOSIS — R2681 Unsteadiness on feet: Secondary | ICD-10-CM | POA: Insufficient documentation

## 2021-04-12 DIAGNOSIS — M21371 Foot drop, right foot: Secondary | ICD-10-CM

## 2021-04-12 DIAGNOSIS — M6281 Muscle weakness (generalized): Secondary | ICD-10-CM | POA: Diagnosis present

## 2021-04-12 NOTE — Patient Instructions (Signed)
    Access Code: [email or web address] URL: [email or web address] Date: 04/12/2021 Prepared by: Glenetta Hew  Exercises Seated March with Resistance - 1 x daily - 7 x weekly - 2 sets - 10 reps - 3 sec hold Supine Posterior Pelvic Tilt - 1 x daily - 7 x weekly - 2 sets - 10 reps - 5 sec hold Sitting to Supine Roll  Patient Education Log Roll

## 2021-04-12 NOTE — Therapy (Addendum)
[facility] [address]  [place], [place], [address] Phone: [phone]   Fax:  [phone]  Physical Therapy Treatment  Patient Details  Name: [name] Trujillo MRN: [id number] Date of Birth: 02-25-1941 Referring Provider (PT): Dorena Dew, NP for [name], MD Cristal Generous, MD (neurology))   Encounter Date: 04/12/2021   PT End of Session - 04/12/21 1104     Visit Number 21    Number of Visits 28    Date for PT Re-Evaluation 05/31/21    Authorization Type [employer]    Authorization Time Period 04/05/21 - 05/31/21    Authorization - Visit Number 1    Authorization - Number of Visits 4    PT Start Time 1104    PT Stop Time 1151    PT Time Calculation (min) 47 min    Activity Tolerance Patient tolerated treatment well    Behavior During Therapy [facility] for tasks assessed/performed             Past Medical History:  Diagnosis Date   High cholesterol    Hypothyroidism    Neuropathy     Past Surgical History:  Procedure Laterality Date   APPENDECTOMY     39-78 yo   BACK SURGERY     Age 80   CESAREAN SECTION     ECTOPIC PREGNANCY SURGERY     LAPAROSCOPIC HYSTERECTOMY      There were no vitals filed for this visit.   Subjective Assessment - 04/12/21 1108     Subjective Pt reports MDs were very pleased with her recovery from her cervical fusion and states Dr. Shawn Stall felt that he might be able to help with her lumbar spine and LE issues pending further diagnostic testing with EMG.    Pertinent History 09/21/20 - ACDF C5-6-7    Patient Stated Goals "to strengthen my neck muscles and improve the motion in my neck to help with my balance"    Currently in Pain? No/denies                               [facility] Adult PT Treatment/Exercise - 04/12/21 1104       Transfers   Transfers Supine to Sit;Sit to Supine    Supine to Sit 5: Supervision    Supine to Sit Details  (indicate cue type and reason) instruction in supine to/from sit via log roll in the event that lumbar surgery is indicated upon further studies by MD    Supine to Sit Details Verbal cues for technique;Verbal cues for sequencing    Sit to Supine 5: Supervision    Sit to Supine Details (indicate cue type and reason) Verbal cues for technique;Verbal cues for sequencing      Lumbar Exercises: Supine   Pelvic Tilt 10 reps;5 seconds    Pelvic Tilt Limitations PPT + red TB hip ABD isometric    Bridge Limitations attempted with red TB hip ABD isometric but deferred due repeated HS cramping      Knee/Hip Exercises: Aerobic   Recumbent Bike L3 x 6 min      Knee/Hip Exercises: Seated   Clamshell with TheraBand Red   R/L single ABD/ER 10 x 3"   Other Seated Knee/Hip Exercises R/L yellow TB hip IR clam + ball squeeze 10 x 3"    Marching Both;10 reps;2 sets;Strengthening    Marching Limitations looped red TB at knees;  2n set seated on Dynadisc with feet on Airex pad to increase abdominal activation                    PT Education - 04/12/21 1150     Education Details HEP update & instruction in log rolling technique - Access Code: [email or web address]    Person(s) Educated Patient    Methods Explanation;Demonstration;Verbal cues;Handout    Comprehension Verbalized understanding;Verbal cues required;Returned demonstration;Need further instruction              PT Short Term Goals - 01/16/21 1338       PT SHORT TERM GOAL #1   Title Patient will be independent with initial HEP    Status Achieved   01/16/21              PT Long Term Goals - 04/12/21 1144       PT LONG TERM GOAL #1   Title Patient will be independent with ongoing/advanced HEP for self-management at home    Status Partially Met   05/06/21 - met for current HEP   Target Date 05/31/21      PT LONG TERM GOAL #2   Title Patient to improve cervical AROM to [facility] without pain provocation    Status Achieved   04/03/21      PT LONG TERM GOAL #3   Title Patient will demonstrate improved B shoulder strength to >/= 4 to 4+/5 for functional UE use    Status Achieved   04/03/21     PT LONG TERM GOAL #4   Title Patient will demonstrate improved B hip strength to >/= 4/5 and B ankle strength to >/= 3+/5 to 4-/5  for improved stability and ease of mobility    Status Partially Met    Target Date 05/31/21      PT LONG TERM GOAL #5   Title Patient will ambulate with improved gait pattern with decreased B foot drop/slap and no evidence of instability/LOB    Status Partially Met   04/05/21 - Improved gait pattern and stability with decreasing dependence on AD, but ongoing mild [place] hip drop and continued L>R foot slap   Target Date 05/31/21      PT LONG TERM GOAL #6   Title Patient will improve Berg score to >/= 50/56 to improve safety and stability with ADLs in standing and reduce risk for falls    Baseline 28/56 on eval, 46/56 as of 04/05/21   baseline as of D/C from PT episode in March 2021 = 52/56   Status Revised   04/05/21 - [name] improved to 46/56 (original LTG of 46/56 met & revised to reflect potential for further improvment)   Target Date 05/31/21      PT LONG TERM GOAL #7   Title Patient will increase gait speed to >/= 2.62 ft/sec to increase safety with community ambulation    Baseline 2.49 ft/sec w/o AD and 2.55 ft/sec with hiking pole as of 04/05/21    Status On-going    Target Date 05/31/21      PT LONG TERM GOAL #8   Title Patient will improve FGA score to >/= 20/30 to improve gait stability and reduce risk for falls    Baseline 16/30   baseline as of D/C from PT episode in March 2021 = 23/30   Status On-going    Target Date 05/31/21  Plan - 04/12/21 1216     Clinical Impression Statement Jasdeep reports MDs pleased with her progress s/p her cervical fusion and is excited because Dr. Shawn Stall is hopeful that he could perform lumbar surgery that would help address her  ongoing LE deficits pending further testing with EMG to determine extent of nerve damage. With this in mind, will continue to address core/abdominal/lumbopelvic and LE strengthening in addition to balance training in upcoming visits and update HEP as indicated. Discussed maintenance frequency for cervical and upper body HEP, recommended continued performance ~3x/wk. Also initiated instruction in log rolling technique for supine to/from sit in prep for potential post-surgical mobility precautions.    Comorbidities neuropathy; hypthyroidism; DDD; remote h/o back surgery at age 38; h/o scoliosis    Rehab Potential Good    PT Frequency 1x / week    PT Duration 8 weeks    PT Treatment/Interventions ADLs/Self Care Home Management;Electrical Stimulation;Iontophoresis 4mg /ml Dexamethasone;Moist Heat;Ultrasound;Gait training;Stair training;Functional mobility training;Therapeutic activities;Therapeutic exercise;Balance training;Neuromuscular re-education;Patient/family education;Orthotic Fit/Training;Manual techniques;Passive range of motion;Dry needling;Taping;Joint Manipulations;Vestibular;Energy conservation    PT Next Visit Plan D/C FOTO for neck; Postural/core strengthening; Proximal LE strengthening targeting hip ABD and ankle strengthening; Balance training & fall prevention    PT Home Exercise Plan MedBridge Access Codes: neck HEP - [id number] 2023/01/04, updated 5/5, 5/10, 5/17 & 5/24), [id number] (6/22); ankle HEP - [id number] (6/15); hip - [id number] (7/26); lumbopelvic - [id number] (8/4)    Consulted and Agree with Plan of Care Patient             Patient will benefit from skilled therapeutic intervention in order to improve the following deficits and impairments:  Abnormal gait, Decreased activity tolerance, Decreased balance, Decreased coordination, Decreased endurance, Decreased knowledge of use of DME, Decreased mobility, Decreased range of motion, Decreased safety awareness, Decreased strength, Difficulty  walking, Increased edema, Increased fascial restricitons, Increased muscle spasms, Impaired perceived functional ability, Impaired flexibility, Impaired sensation, Impaired UE functional use, Improper body mechanics, Postural dysfunction, Dizziness  Visit Diagnosis: Unsteadiness on feet  Other abnormalities of gait and mobility  Muscle weakness (generalized)  Foot drop, left  Foot drop, right     Problem List Patient Active Problem List   Diagnosis Date Noted   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  [place], [place], [address] Phone: [phone]   Fax:  [phone]  Name: Marketa Midkiff MRN: [id number] Date of Birth: [birth year]

## 2021-04-18 ENCOUNTER — Encounter: Payer: Medicare PPO | Admitting: Physical Therapy

## 2021-04-24 ENCOUNTER — Encounter: Payer: Medicare PPO | Admitting: Physical Therapy

## 2021-05-01 ENCOUNTER — Encounter: Payer: Medicare PPO | Admitting: Physical Therapy

## 2021-05-01 ENCOUNTER — Ambulatory Visit: Payer: Medicare PPO

## 2021-05-08 ENCOUNTER — Ambulatory Visit: Payer: Medicare PPO | Admitting: Physical Therapy

## 2021-05-08 ENCOUNTER — Encounter: Payer: Self-pay | Admitting: Physical Therapy

## 2021-05-08 ENCOUNTER — Other Ambulatory Visit: Payer: Self-pay

## 2021-05-08 DIAGNOSIS — M21371 Foot drop, right foot: Secondary | ICD-10-CM

## 2021-05-08 DIAGNOSIS — M6281 Muscle weakness (generalized): Secondary | ICD-10-CM

## 2021-05-08 DIAGNOSIS — M21372 Foot drop, left foot: Secondary | ICD-10-CM

## 2021-05-08 DIAGNOSIS — R2681 Unsteadiness on feet: Secondary | ICD-10-CM

## 2021-05-08 DIAGNOSIS — R2689 Other abnormalities of gait and mobility: Secondary | ICD-10-CM

## 2021-05-08 NOTE — Therapy (Signed)
[facility] [address]  [place], [place], [address] Phone: [phone]   Fax:  [phone]  Physical Therapy Treatment  Patient Details  Name: [name] Trujillo MRN: [id number] Date of Birth: 01/22/1941 Referring Provider (PT): Dorena Dew, NP for [name], MD Cristal Generous, MD (neurology))   Encounter Date: 05/08/2021   PT End of Session - 05/08/21 1315     Visit Number 22    Number of Visits 28    Date for PT Re-Evaluation 05/31/21    Authorization Type [employer]    Authorization Time Period 04/05/21 - 05/31/21    Authorization - Visit Number 2    Authorization - Number of Visits 4    PT Start Time 1315    PT Stop Time 1400    PT Time Calculation (min) 45 min    Activity Tolerance Patient tolerated treatment well    Behavior During Therapy [facility] for tasks assessed/performed             Past Medical History:  Diagnosis Date   High cholesterol    Hypothyroidism    Neuropathy     Past Surgical History:  Procedure Laterality Date   APPEN[age]CTOMY     78-18 yo   BACK SURGERY     Age 80   CESAREAN SECTION     ECTOPIC PREGNANCY SURGERY     LAPAROSCOPIC HYSTERECTOMY      There were no vitals filed for this visit.   Subjective Assessment - 05/08/21 1318     Subjective Pt feels like her balance is getting better - able to stop and chat with her neighbor while on her nightly walk and stand for longer period w/o LOB. R LE is more numb recently. Her appointment with the surgeon to see if something can be done for her back is set for early October.    Pertinent History 09/21/20 - ACDF C5-6-7    Patient Stated Goals "to strengthen my neck muscles and improve the motion in my neck to help with my balance"    Currently in Pain? No/denies                [facility] PT Assessment - 05/08/21 1315       Observation/Other Assessments   Focus on Therapeutic Outcomes (FOTO)  Neck = 59      AROM    Cervical Flexion 51    Cervical Extension 50    Cervical - Right Side Bend 30    Cervical - Left Side Bend 28    Cervical - Right Rotation 50    Cervical - Left Rotation 50                           [facility] Adult PT Treatment/Exercise - 05/08/21 1315       Lumbar Exercises: Stretches   Passive Hamstring Stretch Right;Left;3 reps;30 seconds    Passive Hamstring Stretch Limitations supine with strap    ITB Stretch Right;Left;3 reps;30 seconds    ITB Stretch Limitations supine crossbody with strap    Piriformis Stretch Right;Left;3 reps;30 seconds    Piriformis Stretch Limitations supine KTOS    Figure 4 Stretch 3 reps;30 seconds;Supine;With overpressure      Knee/Hip Exercises: Aerobic   Recumbent Bike L3 x 6 min                      PT Short Term Goals -  01/16/21 1338       PT SHORT TERM GOAL #1   Title Patient will be independent with initial HEP    Status Achieved   01/16/21              PT Long Term Goals - 05/08/21 1322       PT LONG TERM GOAL #1   Title Patient will be independent with ongoing/advanced HEP for self-management at home    Status Partially Met   05/06/21 - met for current HEP   Target Date 05/31/21      PT LONG TERM GOAL #2   Title Patient to improve cervical AROM to [facility] without pain provocation    Status Achieved   04/03/21     PT LONG TERM GOAL #3   Title Patient will demonstrate improved B shoulder strength to >/= 4 to 4+/5 for functional UE use    Status Achieved   04/03/21     PT LONG TERM GOAL #4   Title Patient will demonstrate improved B hip strength to >/= 4/5 and B ankle strength to >/= 3+/5 to 4-/5  for improved stability and ease of mobility    Status Partially Met    Target Date 05/31/21      PT LONG TERM GOAL #5   Title Patient will ambulate with improved gait pattern with decreased B foot drop/slap and no evidence of instability/LOB    Status Partially Met   04/05/21 - Improved gait pattern and  stability with decreasing dependence on AD, but ongoing mild [place] hip drop and continued L>R foot slap   Target Date 05/31/21      PT LONG TERM GOAL #6   Title Patient will improve Berg score to >/= 50/56 to improve safety and stability with ADLs in standing and reduce risk for falls    Baseline 28/56 on eval, 46/56 as of 04/05/21   baseline as of D/C from PT episode in March 2021 = 52/56   Status On-going   04/05/21 - Berg improved to 46/56 (original LTG of 46/56 met & revised to reflect potential for further improvment)   Target Date 05/31/21      PT LONG TERM GOAL #7   Title Patient will increase gait speed to >/= 2.62 ft/sec to increase safety with community ambulation    Baseline 2.49 ft/sec w/o AD and 2.55 ft/sec with hiking pole as of 04/05/21    Status On-going    Target Date 05/31/21      PT LONG TERM GOAL #8   Title Patient will improve FGA score to >/= 20/30 to improve gait stability and reduce risk for falls    Baseline 16/30   baseline as of D/C from PT episode in March 2021 = 23/30   Status On-going    Target Date 05/31/21                   Plan - 05/08/21 1324     Clinical Impression Statement [name] Trujillo notes improved functional balance while walking in her neighborhood allowing her to stop and chat with her neighbors w/o losing her balance as she once would while standing stationary w/o support. She does note slightly stiffer motion in her neck recently but admits to less focus on her neck exercises recently - ROM assessment revealing slight decline (~2 with most motions, but 6-8 in B rotation) - pt to resume more AROM exercises and will reassess need for more specific stretching pending response to exercise.  Attempted to resume core/lumbopelvic strengthening initiated last visit but still limited by cramping sensation in HS with bridge attempts, therefore shifted focus to proximal LE flexibility/stretching to address increased muscle tension/tightness  potentially contributing to the cramping. Bridging reattempted at end of session with pt noting improving tolerance but still having sensation that muscle want to cramp. [name] Trujillo to continue with stretching at home and will attempt to resume progression of core/lumbopelvic strengthening next visit.    Comorbidities neuropathy; hypthyroidism; DDD; remote h/o back surgery at age 89; h/o scoliosis    Rehab Potential Good    PT Frequency 1x / week    PT Duration 8 weeks    PT Treatment/Interventions ADLs/Self Care Home Management;Electrical Stimulation;Iontophoresis 75m/ml Dexamethasone;Moist Heat;Ultrasound;Gait training;Stair training;Functional mobility training;Therapeutic activities;Therapeutic exercise;Balance training;Neuromuscular re-education;Patient/family education;Orthotic Fit/Training;Manual techniques;Passive range of motion;Dry needling;Taping;Joint Manipulations;Vestibular;Energy conservation    PT Next Visit Plan Postural/core strengthening; Proximal LE strengthening targeting hip ABD and ankle strengthening; Balance training & fall prevention    PT Home Exercise Plan MedBridge Access Codes: neck HEP - [id number](Jan 08, 2023 updated 5/5, 5/10, 5/17 & 5/24), [id number] (6/22); ankle HEP - [id number] (6/15); hip - [id number](7/26); lumbopelvic - [id number] (8/4)    Consulted and Agree with Plan of Care Patient             Patient will benefit from skilled therapeutic intervention in order to improve the following deficits and impairments:  Abnormal gait, Decreased activity tolerance, Decreased balance, Decreased coordination, Decreased endurance, Decreased knowledge of use of DME, Decreased mobility, Decreased range of motion, Decreased safety awareness, Decreased strength, Difficulty walking, Increased edema, Increased fascial restricitons, Increased muscle spasms, Impaired perceived functional ability, Impaired flexibility, Impaired sensation, Impaired UE functional use, Improper body mechanics,  Postural dysfunction, Dizziness  Visit Diagnosis: Unsteadiness on feet  Other abnormalities of gait and mobility  Muscle weakness (generalized)  Foot drop, left  Foot drop, right     Problem List Patient Active Problem List   Diagnosis Date Noted   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 [place] [address]Phone: [phone]  Fax:  [phone] Name: CAcquanetta CabanillaMRN: [id number] of Birth: [birth year]

## 2021-05-15 ENCOUNTER — Other Ambulatory Visit: Payer: Self-pay

## 2021-05-15 ENCOUNTER — Ambulatory Visit: Payer: Medicare PPO | Attending: Registered" | Admitting: Physical Therapy

## 2021-05-15 DIAGNOSIS — R2681 Unsteadiness on feet: Secondary | ICD-10-CM

## 2021-05-15 DIAGNOSIS — R2689 Other abnormalities of gait and mobility: Secondary | ICD-10-CM | POA: Diagnosis present

## 2021-05-15 DIAGNOSIS — M21372 Foot drop, left foot: Secondary | ICD-10-CM | POA: Diagnosis present

## 2021-05-15 DIAGNOSIS — M21371 Foot drop, right foot: Secondary | ICD-10-CM | POA: Diagnosis present

## 2021-05-15 DIAGNOSIS — M6281 Muscle weakness (generalized): Secondary | ICD-10-CM | POA: Diagnosis present

## 2021-05-15 NOTE — Patient Instructions (Signed)
     Access Code: [email or web address] URL: [email or web address] Date: 05/15/2021 Prepared by: Glenetta Hew  Exercises Seated Isometric Hip Abduction with Resistance - 1 x daily - 7 x weekly - 2 sets - 10 reps - 3 sec hold Seated Hip Internal Rotation with Ball and Resistance - 1 x daily - 7 x weekly - 2 sets - 10 reps - 3 sec hold Seated Rhomboid Stretch - 1 x daily - 7 x weekly - 3 reps - 30 sec hold Sidelying Hip Abduction - 1 x daily - 7 x weekly - 2 sets - 10 reps - 3-5 sec hold Sidelying Diagonal Hip Abduction - 1 x daily - 7 x weekly - 2 sets - 10 reps - 3 sec hold Sidelying Hip Extension in Abduction - 1 x daily - 7 x weekly - 2 sets - 10 reps - 3 sec hold Sidelying Hip Circles - 1 x daily - 7 x weekly - 2 sets - 10 reps - 3 sec hold

## 2021-05-15 NOTE — Therapy (Signed)
[facility] [address]  [place], [place], [address] Phone: [phone]   Fax:  [phone]  Physical Therapy Treatment  Patient Details  Name: [name] Trujillo MRN: [id number] Date of Birth: 1941-05-29 Referring Provider (PT): [name] Dew, NP for [name] Amber, MD [name] Generous, MD (neurology))   Encounter Date: 05/15/2021   PT End of Session - 05/15/21 1314     Visit Number 23    Number of Visits 28    Date for PT Re-Evaluation 05/31/21    Authorization Type [employer]    Authorization Time Period 04/05/21 - 05/31/21    Authorization - Visit Number 3    Authorization - Number of Visits 4    PT Start Time 1314    PT Stop Time 1354    PT Time Calculation (min) 40 min    Activity Tolerance Patient tolerated treatment well    Behavior During Therapy [facility] for tasks assessed/performed             Past Medical History:  Diagnosis Date   High cholesterol    Hypothyroidism    Neuropathy     Past Surgical History:  Procedure Laterality Date   APPENDECTOMY     8-18 yo   BACK SURGERY     Age 40   CESAREAN SECTION     ECTOPIC PREGNANCY SURGERY     LAPAROSCOPIC HYSTERECTOMY      There were no vitals filed for this visit.   Subjective Assessment - 05/15/21 1317     Subjective Pt reports cramping has been better with stretches. She feels that her balance is doing well. Pt inquiring about how to complete floor transfers.    Pertinent History 09/21/20 - ACDF C5-6-7    Patient Stated Goals "to strengthen my neck muscles and improve the motion in my neck to help with my balance"                               [facility] PT Treatment/Exercise - 05/15/21 1314       Transfers   Transfers Floor to Transfer    Floor to Transfer 5: Supervision;4: Min guard;Multiple attempts;With upper extremity assist    Floor to Transfer Details (indicate cue type and reason) provided instruction in  floor to stand transfer with and w/o furniture support    Floor to Transfer Details Verbal cues for technique;Visual cues/gestures for sequencing    Number of Reps --   4 reps     Knee/Hip Exercises: Aerobic   Recumbent Bike L3 x 6 min      Knee/Hip Exercises: Sidelying   Hip ABduction Left;Right;10 reps;2 sets    Hip ABduction Limitations + slight hip extension on 1st set & diagonals on 2nd set    Other Sidelying Knee/Hip Exercises L/R fwd/back arcing tap (tic-toc) over opp LE x 15    Other Sidelying Knee/Hip Exercises L/R hip circles CW/CCW 2 x 5                   PT Education - 05/15/21 1354     Education Details HEP update (sidelying hip stengthening) - Access Code: [email or web address] & instruction in floor transfers    Person(s) Educated Patient    Methods Explanation;Demonstration;Verbal cues;Tactile cues    Comprehension Verbalized understanding;Verbal cues required;Tactile cues required;Returned demonstration;Need further instruction  PT Short Term Goals - 01/16/21 1338       PT SHORT TERM GOAL #1   Title Patient will be independent with initial HEP    Status Achieved   01/16/21              PT Long Term Goals - 05/08/21 1322       PT LONG TERM GOAL #1   Title Patient will be independent with ongoing/advanced HEP for self-management at home    Status Partially Met   05/06/21 - met for current HEP   Target Date 05/31/21      PT LONG TERM GOAL #2   Title Patient to improve cervical AROM to [facility] without pain provocation    Status Achieved   04/03/21     PT LONG TERM GOAL #3   Title Patient will demonstrate improved B shoulder strength to >/= 4 to 4+/5 for functional UE use    Status Achieved   04/03/21     PT LONG TERM GOAL #4   Title Patient will demonstrate improved B hip strength to >/= 4/5 and B ankle strength to >/= 3+/5 to 4-/5  for improved stability and ease of mobility    Status Partially Met    Target Date 05/31/21      PT LONG TERM  GOAL #5   Title Patient will ambulate with improved gait pattern with decreased B foot drop/slap and no evidence of instability/LOB    Status Partially Met   04/05/21 - Improved gait pattern and stability with decreasing dependence on AD, but ongoing mild [place] hip drop and continued L>R foot slap   Target Date 05/31/21      PT LONG TERM GOAL #6   Title Patient will improve Berg score to >/= 50/56 to improve safety and stability with ADLs in standing and reduce risk for falls    Baseline 28/56 on eval, 46/56 as of 04/05/21   baseline as of D/C from PT episode in March 2021 = 52/56   Status On-going   04/05/21 - [name] Trujillo improved to 46/56 (original LTG of 46/56 met & revised to reflect potential for further improvment)   Target Date 05/31/21      PT LONG TERM GOAL #7   Title Patient will increase gait speed to >/= 2.62 ft/sec to increase safety with community ambulation    Baseline 2.49 ft/sec w/o AD and 2.55 ft/sec with hiking pole as of 04/05/21    Status On-going    Target Date 05/31/21      PT LONG TERM GOAL #8   Title Patient will improve FGA score to >/= 20/30 to improve gait stability and reduce risk for falls    Baseline 16/30   baseline as of D/C from PT episode in March 2021 = 23/30   Status On-going    Target Date 05/31/21                   Plan - 05/15/21 1343     Clinical Impression Statement [name] Trujillo reports a feeling of improved balance with decreasing reliance on external support although she continues to note persistent numbness in R>L LE. She inquired about how to get up from the floor in case of a fall or need to get down on the floor, therefore provided instruction in floor transfers with and without furniture support. She was only able to complete floor transfer w/o furniture support through R  kneel leading with L LE due to numbness in R  LE and did demonstrate slight wobble upon standing for first few reps but verbalized good understanding and confidence in the  technique. Remainder of session focused on continued proximal LE strengthening address hip abduction and extension from a sidelying position - pt demonstrating limited coordination of movement but wanting to practice more on her own, therefore updated HEP instructions provided.    Comorbidities neuropathy; hypthyroidism; DDD; remote h/o back surgery at age 56; h/o scoliosis    Rehab Potential Good    PT Frequency 1x / week    PT Duration 8 weeks    PT Treatment/Interventions ADLs/Self Care Home Management;Electrical Stimulation;Iontophoresis 45m/ml Dexamethasone;Moist Heat;Ultrasound;Gait training;Stair training;Functional mobility training;Therapeutic activities;Therapeutic exercise;Balance training;Neuromuscular re-education;Patient/family education;Orthotic Fit/Training;Manual techniques;Passive range of motion;Dry needling;Taping;Joint Manipulations;Vestibular;Energy conservation    PT Next Visit Plan Goal assessment with probable transition to HEP    PT Home Exercise Plan MedBridge Access Codes: neck HEP - [id number](12/22/22 updated 5/5, 5/10, 5/17 & 5/24), [id number] (6/22); ankle HEP - [id number] (6/15); hip - [id number](7/26, updated 9/6); lumbopelvic - [id number] (8/4)    Consulted and Agree with Plan of Care Patient             Patient will benefit from skilled therapeutic intervention in order to improve the following deficits and impairments:  Abnormal gait, Decreased activity tolerance, Decreased balance, Decreased coordination, Decreased endurance, Decreased knowledge of use of DME, Decreased mobility, Decreased range of motion, Decreased safety awareness, Decreased strength, Difficulty walking, Increased edema, Increased fascial restricitons, Increased muscle spasms, Impaired perceived functional ability, Impaired flexibility, Impaired sensation, Impaired UE functional use, Improper body mechanics, Postural dysfunction, Dizziness  Visit Diagnosis: Unsteadiness on feet  Other abnormalities  of gait and mobility  Muscle weakness (generalized)  Foot drop, left  Foot drop, right     Problem List Patient Active Problem List   Diagnosis Date Noted   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 [place] [address]Phone: [phone]  Fax:  [phone] Name: CKayloni RoccoMRN: [id number] of Birth: 601-26-1942

## 2021-05-22 ENCOUNTER — Encounter: Payer: Medicare PPO | Admitting: Physical Therapy

## 2021-05-29 ENCOUNTER — Encounter: Payer: Self-pay | Admitting: Physical Therapy

## 2021-05-29 ENCOUNTER — Ambulatory Visit: Payer: Medicare PPO | Admitting: Physical Therapy

## 2021-05-29 ENCOUNTER — Other Ambulatory Visit: Payer: Self-pay

## 2021-05-29 DIAGNOSIS — R2689 Other abnormalities of gait and mobility: Secondary | ICD-10-CM

## 2021-05-29 DIAGNOSIS — R2681 Unsteadiness on feet: Secondary | ICD-10-CM | POA: Diagnosis not present

## 2021-05-29 DIAGNOSIS — M21371 Foot drop, right foot: Secondary | ICD-10-CM

## 2021-05-29 DIAGNOSIS — M21372 Foot drop, left foot: Secondary | ICD-10-CM

## 2021-05-29 DIAGNOSIS — M6281 Muscle weakness (generalized): Secondary | ICD-10-CM

## 2021-05-29 NOTE — Therapy (Signed)
[facility] [address]  [place], [place], [address] Phone: [phone]   Fax:  [phone]  Physical Therapy Treatment / Discharge Summary  Patient Details  Name: [name] Trujillo MRN: [id number] Date of Birth: 1940-11-25 Referring Provider (PT): Dorena Dew, NP for [name], MD (Cristal Generous, MD (neurology))  Progress Note Reporting Period 04/05/2021 to 05/29/2021  See note below for Objective Data and Assessment of Progress/Goals.     Encounter Date: 05/29/2021   PT End of Session - 05/29/21 1318     Visit Number 24    Number of Visits 28    Date for PT Re-Evaluation 05/31/21    Authorization Type [employer]    Authorization Time Period 04/05/21 - 05/31/21    Authorization - Visit Number 4    Authorization - Number of Visits 4    PT Start Time 1318    PT Stop Time 1412    PT Time Calculation (min) 54 min    Activity Tolerance Patient tolerated treatment well    Behavior During Therapy WFL for tasks assessed/performed             Past Medical History:  Diagnosis Date   High cholesterol    Hypothyroidism    Neuropathy     Past Surgical History:  Procedure Laterality Date   APPEN[age]CTOMY     33-18 yo   BACK SURGERY     Age 60   CESAREAN SECTION     ECTOPIC PREGNANCY SURGERY     LAPAROSCOPIC HYSTERECTOMY      There were no vitals filed for this visit.   Subjective Assessment - 05/29/21 1322     Subjective Pt reports the more recent hip strengthening exercises seemed to flare up her back and caused some radiculopathy down to her knees - now resolved.    Pertinent History 09/21/20 - ACDF C5-6-7    Patient Stated Goals "to strengthen my neck muscles and improve the motion in my neck to help with my balance"    Currently in Pain? No/denies                [facility] PT Assessment - 05/29/21 1318       Assessment   Medical Diagnosis Cervical ACDF C5-6-7; Unsteadiness of  feet/Gait instability    Referring Provider (PT) Dorena Dew, NP for [name], MD   Cristal Generous, MD (neurology)   Onset Date/Surgical Date 09/21/20      Strength   Right Shoulder Flexion 5/5    Right Shoulder ABduction 4+/5    Right Shoulder Internal Rotation 5/5    Right Shoulder External Rotation 4+/5    Left Shoulder Flexion 5/5    Left Shoulder ABduction 4+/5    Left Shoulder Internal Rotation 5/5    Left Shoulder External Rotation 4+/5    Right Elbow Flexion 5/5    Right Elbow Extension 5/5    Left Elbow Flexion 5/5    Left Elbow Extension 5/5    Right Hand Grip (lbs) 30   35, 30, 25   Left Hand Grip (lbs) 24.33   28, 23, 22   Right Hip Flexion 4/5    Right Hip Extension 4+/5   tested in sitting per pt request   Right Hip External Rotation  4-/5    Right Hip Internal Rotation 4/5    Right Hip ABduction 4+/5   tested in sitting per pt request   Right Hip ADduction 4+/5  tested in sitting per pt request   Left Hip Flexion 4+/5    Left Hip Extension 4+/5   tested in sitting per pt request   Left Hip External Rotation 4-/5    Left Hip Internal Rotation 4/5    Left Hip ABduction 4+/5   tested in sitting per pt request   Left Hip ADduction 4+/5    Right Knee Flexion 5/5    Right Knee Extension 5/5    Left Knee Flexion 5/5    Left Knee Extension 5/5    Right Ankle Dorsiflexion 4-/5    Right Ankle Plantar Flexion 3/5   unable to initiate single leg heel raise; 4/5 with manual resistance   Right Ankle Inversion 4+/5    Right Ankle Eversion 4-/5    Left Ankle Dorsiflexion 3/5    Left Ankle Plantar Flexion 3/5   unable to initiate single leg heel raise; 4/5 with manual resistance   Left Ankle Inversion 2-/5    Left Ankle Eversion 3+/5      Ambulation/Gait   Ambulation/Gait Assistance 6: Modified independent (Device/Increase time)    Assistive device None;Other (Comment)   single walking pole   Gait Pattern --   L>R foot slap   Ambulation Surface Level;Indoor     Gait velocity 2.49 ft/sec w/o AD; 2.74 ft/sec with hiking pole      Standardized Balance Assessment   10 Meter Walk 13.19 sec w/o AD; 11.97 sec with hiking pole      Berg Balance Test   Sit to Stand Able to stand without using hands and stabilize independently    Standing Unsupported Able to stand safely 2 minutes    Sitting with Back Unsupported but Feet Supported on Floor or Stool Able to sit safely and securely 2 minutes    Stand to Sit Sits safely with minimal use of hands    Transfers Able to transfer safely, minor use of hands    Standing Unsupported with Eyes Closed Able to stand 10 seconds safely    Standing Unsupported with Feet Together Able to place feet together independently and stand 1 minute safely    From Standing, Reach Forward with Outstretched Arm Can reach forward >12 cm safely (5")    From Standing Position, Pick up Object from Floor Able to pick up shoe safely and easily    From Standing Position, Turn to Look Behind Over each Shoulder Looks behind from both sides and weight shifts well    Turn 360 Degrees Able to turn 360 degrees safely one side only in 4 seconds or less    Standing Unsupported, Alternately Place Feet on Step/Stool Able to stand independently and complete 8 steps >20 seconds    Standing Unsupported, One Foot in Front Able to plae foot ahead of the other independently and hold 30 seconds    Standing on One Leg Tries to lift leg/unable to hold 3 seconds but remains standing independently    Total Score 49    Berg comment: 46-51 moderate fall risk (>50%)      Timed Up and Go Test   Normal TUG (seconds) 11.1   w/o AD; 10.88 sec with hiking pole     Functional Gait  Assessment   Gait Level Surface Walks 20 ft in less than 7 sec but greater than 5.5 sec, uses assistive device, slower speed, mild gait deviations, or deviates 6-10 in outside of the 12 in walkway width.    Change in Gait Speed Able to smoothly  change walking speed without loss of balance or  gait deviation. Deviate no more than 6 in outside of the 12 in walkway width.    Gait with Horizontal Head Turns Performs head turns smoothly with slight change in gait velocity (eg, minor disruption to smooth gait path), deviates 6-10 in outside 12 in walkway width, or uses an assistive device.    Gait with Vertical Head Turns Performs task with slight change in gait velocity (eg, minor disruption to smooth gait path), deviates 6 - 10 in outside 12 in walkway width or uses assistive device    Gait and Pivot Turn Pivot turns safely in greater than 3 sec and stops with no loss of balance, or pivot turns safely within 3 sec and stops with mild imbalance, requires small steps to catch balance.    Step Over Obstacle Is able to step over 2 stacked shoe boxes taped together (9 in total height) without changing gait speed. No evidence of imbalance.    Gait with Narrow Base of Support Ambulates 4-7 steps.    Gait with Eyes Closed Cannot walk 20 ft without assistance, severe gait deviations or imbalance, deviates greater than 15 in outside 12 in walkway width or will not attempt task.    Ambulating Backwards Walks 20 ft, uses assistive device, slower speed, mild gait deviations, deviates 6-10 in outside 12 in walkway width.    Steps Alternating feet, must use rail.    Total Score 19    FGA comment: 19-24 = medium risk fall                           [facility] Adult PT Treatment/Exercise - 05/29/21 1318       Knee/Hip Exercises: Aerobic   Recumbent Bike L3 x 6 min                       PT Short Term Goals - 01/16/21 1338       PT SHORT TERM GOAL #1   Title Patient will be independent with initial HEP    Status Achieved   01/16/21              PT Long Term Goals - 05/29/21 1335       PT LONG TERM GOAL #1   Title Patient will be independent with ongoing/advanced HEP for self-management at home    Status Achieved   05/29/21     PT LONG TERM GOAL #2   Title Patient  to improve cervical AROM to [facility] without pain provocation    Status Achieved   04/03/21     PT LONG TERM GOAL #3   Title Patient will demonstrate improved B shoulder strength to >/= 4 to 4+/5 for functional UE use    Status Achieved   04/03/21     PT LONG TERM GOAL #4   Title Patient will demonstrate improved B hip strength to >/= 4/5 and B ankle strength to >/= 3+/5 to 4-/5  for improved stability and ease of mobility    Status Partially Met   05/29/21 - met for hips except B ER 4-/5; met for R ankle ankle except weightbearing resisted PF; not met for L ankle     PT LONG TERM GOAL #5   Title Patient will ambulate with improved gait pattern with decreased B foot drop/slap and no evidence of instability/LOB    Status Partially Met   05/29/21 - Trendelenburg  hip drop and lateral instability is less prominent with no LOB, but intermittent B foot slap still evident with gait as a result of ongoing foot/ankle weakness and loss of sensation     PT LONG TERM GOAL #6   Title Patient will improve Berg score to >/= 50/56 to improve safety and stability with ADLs in standing and reduce risk for falls    Baseline 28/56 on eval, 46/56 as of 04/05/21   baseline as of D/C from PT episode in March 2021 = 52/56   Status Partially Met   05/29/21 - Berg increased to 49/56     PT LONG TERM GOAL #7   Title Patient will increase gait speed to >/= 2.62 ft/sec to increase safety with community ambulation    Baseline 2.49 ft/sec w/o AD and 2.55 ft/sec with hiking pole as of 04/05/21    Status Achieved   05/29/21 - met for gait with AD - 2.74 ft/sec with hiking pole     PT LONG TERM GOAL #8   Title Patient will improve FGA score to >/= 20/30 to improve gait stability and reduce risk for falls    Baseline 16/30   baseline as of D/C from PT episode in March 2021 = 23/30   Status Partially Met   05/29/21 - FGA improved to 19/30                  Plan - 05/29/21 1412     Clinical Impression Statement Nidhi is  very pleased with her progress with her cervical ROM, posture and UE strength with PT following her ACDF as well as the improvements noted in her balance, gait and overall LE strength. Her cervical ROM is now [facility], and B shoulder strength is now 4+/5 to 5/5, with improved muscle mass and tone noted in her hand with good functional grips bilaterally. Continued gains noted in B LE strength with only mild weakness still evident proximally at hips with ER, although more pronounced ongoing weakness present in L>R ankle. Her gait speed is currently 2.49 ft/sec w/o AD and 2.74 ft/sec with hiking pole. Trendelenburg hip drop and lateral instability is less prominent but intermittent B foot slap still evident with gait as a result of ongoing foot/ankle weakness and loss of sensation, although she reports improved gait stability with decreased reliance on walking pole as AD. Balance has improved per all standardized testing with 5xSTS decreased to 14.85 sec (w/o any instability noted) from 22.81 sec (with patient requiring 1-2 forward steps to right her balance with each sit to stand), Berg increased from 28/56 to 49/56 indicating a reduced fall risk to moderate (>50%) from high (close to 100%), FGA increased from 16/30 to 19/30 indicating a reduced fall risk from high to medium, and TUG reduced from 15.25 sec w/o AD to 11.1 sec w/o AD and 10.88 sec with hiking pole no longer indicating a high fall risk. LTGs #1, 2, 3 & 7 now met with remaining goals at least partially met. Rebbeca will transition to her HEP at this time while she proceeds with f/u with neurosurgery for possible surgical intervention for her LE myelopathy.    Comorbidities neuropathy; hypthyroidism; DDD; remote h/o back surgery at age 38; h/o scoliosis    Rehab Potential Good    PT Treatment/Interventions ADLs/Self Care Home Management;Electrical Stimulation;Iontophoresis 18m/ml Dexamethasone;Moist Heat;Ultrasound;Gait training;Stair training;Functional  mobility training;Therapeutic activities;Therapeutic exercise;Balance training;Neuromuscular re-education;Patient/family education;Orthotic Fit/Training;Manual techniques;Passive range of motion;Dry needling;Taping;Joint Manipulations;Vestibular;Energy conservation    PT Next Visit Plan  Discharge from PT with transition to [place] Access Codes: neck HEP - [id number] 12-22-22, updated 5/5, 5/10, 5/17 & 5/24), [id number] (6/22); ankle HEP - [id number] (6/15); hip - [id number] (7/26, updated 9/6); lumbopelvic - [id number] (8/4)    Consulted and Agree with Plan of Care Patient             Patient will benefit from skilled therapeutic intervention in order to improve the following deficits and impairments:  Abnormal gait, Decreased activity tolerance, Decreased balance, Decreased coordination, Decreased endurance, Decreased knowledge of use of DME, Decreased mobility, Decreased range of motion, Decreased safety awareness, Decreased strength, Difficulty walking, Increased edema, Increased fascial restricitons, Increased muscle spasms, Impaired perceived functional ability, Impaired flexibility, Impaired sensation, Impaired UE functional use, Improper body mechanics, Postural dysfunction, Dizziness  Visit Diagnosis: Unsteadiness on feet  Other abnormalities of gait and mobility  Muscle weakness (generalized)  Foot drop, left  Foot drop, right     Problem List Patient Active Problem List   Diagnosis Date Noted   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   Remaining deficits:   Ongoing LE myelopathy resulting in continued distal LE weakness (L>R), impaired balance and gait instability.   Education / Equipment:   HEP    Patient agrees to discharge. Patient goals were partially met. Patient is being discharged due to pending neurosurgical evaluation and being pleased with the current functional level.  Percival Spanish, PT 05/29/2021, 7:21 PM  [facility] [address]  [place], [place], [address] Phone: [phone]   Fax:  [phone]  Name: Andrienne Havener MRN: [id number] Date of Birth: 1941-05-21

## 2021-06-12 ENCOUNTER — Ambulatory Visit: Payer: Medicare PPO | Attending: Nurse Practitioner

## 2021-06-12 ENCOUNTER — Telehealth (INDEPENDENT_AMBULATORY_CARE_PROVIDER_SITE_OTHER): Payer: Self-pay

## 2021-06-12 DIAGNOSIS — M48061 Spinal stenosis, lumbar region without neurogenic claudication: Secondary | ICD-10-CM | POA: Insufficient documentation

## 2021-06-12 DIAGNOSIS — M5416 Radiculopathy, lumbar region: Secondary | ICD-10-CM | POA: Insufficient documentation

## 2021-06-12 NOTE — Telephone Encounter (Signed)
Attempted to call neurology of [facility] for EMG report request - indicated that the patient obtained EMG today and report is not yet ready.     Indicated they will let Dr. Scherry Ran know that patient has an appointment with [place] tomorrow and will try to fax over before patient's appt.    2:40PM ET  06/12/21  Tyson Babinski, MA

## 2021-06-13 ENCOUNTER — Encounter (INDEPENDENT_AMBULATORY_CARE_PROVIDER_SITE_OTHER): Payer: Self-pay | Admitting: Neurological Surgery

## 2021-06-13 ENCOUNTER — Ambulatory Visit (INDEPENDENT_AMBULATORY_CARE_PROVIDER_SITE_OTHER): Payer: Medicare PPO | Admitting: Neurological Surgery

## 2021-06-13 VITALS — BP 152/70 | HR 97 | Resp 18 | Ht 61.5 in | Wt 123.8 lb

## 2021-06-13 DIAGNOSIS — M5416 Radiculopathy, lumbar region: Secondary | ICD-10-CM

## 2021-06-13 NOTE — Progress Notes (Signed)
[facility] Neurosurgery  Follow up Note    Previous Impression/Plan     Stephanie Castaneda is a 80 year-old female six months s/p C5-7 ACDF. I personally reviewed the x-ray studies of the cervical spine, demonstrating a stable instrumented fusion without evidence of hardware failure or malalignment.  She will plan to follow-up in six months with repeat x-ray studies of the cervical spine.     We reviewed the MRI of the lumbar spien completed in November of 2021 which demonstrates multilevel lumbar spondylosis with degenerative disc disease most significant at L1-2 and L4-5 with multilevel foraminal stenosis. We will repeat the MRI of the lumbar for an updated evaluation along with an EMG/NCV to assess lumbar radiculopathy. She will follow-up with me upon completion of the MRI of the Lumbar spine and EMG/NCV.    HPI     Chief Complaint   Patient presents with    Follow-up     Lumbar radiculopathy   DOS 09/21/20  S/P C5-7 ACDF  EMG  MRI L-SPINE  BLE - sx not improving for numbness and tingling     Stephanie Castaneda is a 35 year-old female following up today 8 months after a C5-7 ACDF, following up today with continued bilateral lower extremity numbness, gait instability.  Patient reports that her numbness in her right leg is worse than it was before.  She feels very unsteady on her feet because of this.  Approximately 3 weeks ago, her physical therapist gave her a new exercise to do at home, began to have an "crinkling pain" in her back, so she stopped doing the exercises and the pain stopped when she stopped the exercises.  Her foot drop persists, that has been present for approximately 2 to 3 years in the left foot.  She has numbness in both of her feet, wraps around both of her feet and the top and bottom of the foot.    Physical Examination   VITAL SIGNS:   height is 1.562 m (5' 1.5") and weight is 56.2 kg (123 lb 12.8 oz). Her blood pressure is 152/70 and her pulse is 97. Her respiration is 18.        Awake,  alert, oriented x3, Follows commands  GCS: 15   Speech is clear  Attention span and concentration: intact  Recent and remote memory: intact     Gait Intact  Able to walk on toes and heels     Point tenderness: None      Motor Exam:                            R          L            Deltoid                                     5          5  Tricep                                      5          5  Bicep  5          5  Wrist Extension                       5          5  Wrest Flexion                          5          5  Finger Abduction                     5          5  Grip                                         5          5     L2-3     Iliopsoas (Hip Flexion)                        5          5  L3-4     Quadriceps (Knee Extension)             5          5  L5-S1   Hamstring (Knee Flexion)                   5          5  L4-5     Tibialis Anterior (Foot Dorsiflexion)    4          4-  S1        Gastrocsoleus (Plantar Flexion)         5          5  L5        EHL (Toe Dorsiflexion)                       4          4-     Rapid alternating movements intact     Decreased sensation to LT and PP along the RLE from the hip to the foot, and decreased sensation LLE foot                Reflexes:  DTRs symmetric, 1+ throughout        Clonus: right negative, left negative   Babinski: right negative, left negative   Hoffman's: right  negative, left negative     Review of Systems   A review of systems is otherwise negative for all systems other than what has been indicated in the HPI.    Radiology Interpretation   MRI Lumbar Spine WO Contrast [[id number]] (Order [id number])  Status: Final result     Study Result    Narrative & Impression   HISTORY: Lumbar spondylosis. Bilateral leg numbness.     COMPARISON: MRI of the lumbar spine dated July 28, 2020.     TECHNIQUE: MRI of the lumbar spine performed on a 3.0 Tesla scanner  without intravenous contrast.        FINDINGS:   There is a normal  lumbar lordosis. There are multilevel Schmorl's nodes.  The overall vertebral body heights are maintained. There is  a 4 mm grade  1 retrolisthesis of L1 on L2. There is a 3 Miller grade 1 retrolisthesis  of L2 on L3. There is degenerative disc disease with multilevel loss of  disc height, disc bulging and endplate osteophytic spurring. The conus  medullaris is normal and terminates at L1.     At L1-L2, there is diffuse disc bulging and endplate osteophytic  spurring. There is facet hypertrophy. There is no spinal canal stenosis.  There is mild to moderate right neural foraminal stenosis.     At L2-L3, there is diffuse disc bulging and endplate osteophytic  spurring. There is facet and ligamentum flavum hypertrophy. There is  moderate spinal canal stenosis. There is mild bilateral neural foraminal  stenosis.     At L3-L4, there is diffuse disc bulging and endplate osteophytic  spurring. There is facet and ligamentum flavum hypertrophy. There is  mild spinal canal stenosis. There is mild bilateral neural foraminal  stenosis.     At L4-L5, there is irregularity of the lamina, suggestive of prior  laminectomy. There is diffuse disc bulging and endplate osteophytic  spurring. There is facet hypertrophy. There is no spinal canal stenosis.  There is mild right and moderate left neural foraminal stenosis.     At L5-S1, there is irregularity of the left lamina which may reflect  previous laminectomy. There is disc bulging and endplate osteophytic  spurring. There is facet hypertrophy. There is no spinal canal stenosis.  There is mild right and moderate left neural foraminal stenosis.     Overall, degenerative findings are similar compared to July 28, 2020.     IMPRESSION:      1.  Postoperative and degenerative changes of the lumbar spine as  described, similar compared to July 28, 2020. At L2-L3, there is  moderate spinal canal stenosis due to disc bulging, endplate osteophytic  spurring, facet and ligamentum  flavum hypertrophy. Milder spinal canal  stenosis is seen at L3-L4. There is multilevel neural foraminal  stenosis.     Georgann Housekeeper, MD   06/14/2021 9:59 AM       Impression/Plan     Baxter Flattery Stephanie Castaneda is a 80 year old female with significant lumbar spondylosis, with moderate central stenosis at L2-3, mild to moderate central stenosis at L3-4, with right lateral recess stenosis at L1-2, bilateral lateral recess stenosis at L2-3, bilateral lateral recess stenosis at L4-5, left lateral recess stenosis at L5-S1.  At this time, my recommendation would be to try and manage symptoms conservatively with physical therapy and epidural steroid injections.  I will refer the patient for epidural steroid injection at L2-3, and then in a 3-week delayed fashion epidural steroid injection at L4-5.  She will plan to follow-up with [place] approximately 2 to 3 weeks after her second epidural steroid injection for reevaluation of symptoms.    Follow-up   2 to 3 weeks after second ESI    All relevant and clinical information was transcribed by me, Rondel Oh, NP-C, acting as a scribe for Dr. [name].    I, [name], MD, personally performed the services documented. Rondel Oh, NP is scribing for me for this patient. This note and the patient instructions accurately reflect work and decisions made by me, [name], MD.    Marlaine Hind, MD

## 2021-06-14 ENCOUNTER — Encounter (INDEPENDENT_AMBULATORY_CARE_PROVIDER_SITE_OTHER): Payer: Self-pay

## 2021-06-28 ENCOUNTER — Encounter (INDEPENDENT_AMBULATORY_CARE_PROVIDER_SITE_OTHER): Payer: Self-pay

## 2021-07-05 ENCOUNTER — Ambulatory Visit: Payer: Medicare PPO | Attending: Registered" | Admitting: Physical Therapy

## 2021-07-05 ENCOUNTER — Encounter: Payer: Self-pay | Admitting: Physical Therapy

## 2021-07-05 ENCOUNTER — Other Ambulatory Visit: Payer: Self-pay

## 2021-07-05 DIAGNOSIS — M6281 Muscle weakness (generalized): Secondary | ICD-10-CM | POA: Diagnosis present

## 2021-07-05 DIAGNOSIS — M21372 Foot drop, left foot: Secondary | ICD-10-CM | POA: Diagnosis present

## 2021-07-05 DIAGNOSIS — R2681 Unsteadiness on feet: Secondary | ICD-10-CM | POA: Diagnosis present

## 2021-07-05 DIAGNOSIS — M5416 Radiculopathy, lumbar region: Secondary | ICD-10-CM | POA: Insufficient documentation

## 2021-07-05 DIAGNOSIS — R2689 Other abnormalities of gait and mobility: Secondary | ICD-10-CM | POA: Insufficient documentation

## 2021-07-05 DIAGNOSIS — M21371 Foot drop, right foot: Secondary | ICD-10-CM | POA: Insufficient documentation

## 2021-07-05 NOTE — Therapy (Addendum)
[facility] Outpatient Rehabilitation [facility] [address]  [address] [place], [place], [address] Phone: [phone]   Fax:  [phone]  Physical Therapy Evaluation  Patient Details  Name: [name] Trujillo MRN: [id number] Date of Birth: 24-Mar-1941 Referring Provider (PT): Rondel Oh, NP for [name], MD   Encounter Date: 07/05/2021   PT End of Session - 07/05/21 1337     Visit Number 1    Number of Visits 16    Date for PT Re-Evaluation 08/30/21    Authorization Type [employer]    PT Start Time 1317    PT Stop Time 1411    PT Time Calculation (min) 54 min    Activity Tolerance Patient tolerated treatment well    Behavior During Therapy [facility] for tasks assessed/performed              Past Medical History:  Diagnosis Date   High cholesterol    Hypothyroidism    Neuropathy     Past Surgical History:  Procedure Laterality Date   APPEN[age]CTOMY     7-18 yo   BACK SURGERY     Age 40   CESAREAN SECTION     ECTOPIC PREGNANCY SURGERY     LAPAROSCOPIC HYSTERECTOMY      There were no vitals filed for this visit.    Subjective Assessment - 07/05/21 1321     Subjective [name] Trujillo reports she had her appt with the MD for the f/u with her neck surgery as well as consultation for her worsening back/LE radiculopathy issues. She feels that she is fully recovered as far as her neck is concerned, but feels that her legs are getting worse. She states the MD was concerned about her worsening drop foot on her L and well as reduced NCV/EMG signals. She is scheduled to have ESI injections x 2 (11/11 & 12/2) to help further diagnose what might be going on with her back. In the interim, the MD wants her to work with PT for her lumbar radiculopathy. She denies back pain or radicular pain in LEs but reports worsening B LE numbness and tingling. She notes increased difficulty with getting in/out of shower and reduced walking tolerance, but feels like her balance has  not changed as much since last therapy episode.    Pertinent History 09/21/20 - ACDF C5-6-7    Limitations Sitting;Standing;Walking;House hold activities    How long can you sit comfortably? 20-25 minutes    How long can you stand comfortably? >20 minutes    How long can you walk comfortably? 1/2-3/4 mile but feels like she has to push herself more than she used to    Diagnostic tests 06/12/21 - Lumbar MRI: Postoperative and degenerative changes of the lumbar spine as described, similar compared to July 28, 2020. At L2-L3, there is moderate spinal canal stenosis due to disc bulging, endplate osteophytic spurring, facet and ligamentum flavum hypertrophy. Milder spinal canal stenosis is seen at L3-L4. There is multilevel neural foraminal stenosis.    Patient Stated Goals "to keep me walking"    Currently in Pain? No/denies    Pain Score 0-No pain    Pain Location Leg    Pain Orientation Right;Left;Distal    Pain Descriptors / Indicators Numbness;Tingling                [facility] PT Assessment - 07/05/21 1317       Assessment   Medical Diagnosis Lumbar radiculopathy    Referring Provider (PT) Rondel Oh,  NP for [name], MD    Hand Dominance Right    Next MD Visit TBD   ESI on 07/20/21 & 08/10/21 - f/u with MD 3 wks s/p 2nd injection   Prior Therapy PT s/p ACDF earlier this year; PT in 2018 & 2020/2021 for abnormality of gait due to peripheral neuropathy & L foot drop      Precautions   Precautions Fall      Restrictions   Weight Bearing Restrictions No      Balance Screen   Has the patient fallen in the past 6 months No    Has the patient had a decrease in activity level because of a fear of falling?  Yes    Is the patient reluctant to leave their home because of a fear of falling?  Yes      Home Environment   Living Environment Private residence    Living Arrangements Spouse/significant other    Type of Home House    Home Access Stairs to enter    Entrance  Stairs-Number of Steps 5-6    Entrance Stairs-Rails Left    Home Layout One level;Laundry or work area in [employer] --   hiking poles - usually uses just 1     Prior Function   Level of Independence Independent    Vocation Retired    Leisure walking 2x./day for total of ~1.5 miles; working in the yard; Curator   Overall Cognitive Status Within Functional Limits for tasks assessed      ROM / Strength   AROM / PROM / Strength AROM;Strength      AROM   Overall AROM Comments ROM assessment completed with SBA/CGA of PT for balance    AROM Assessment Site Lumbar    Lumbar Flexion hands to mid shins    Lumbar Extension 25% limited    Lumbar - Right Side Bend hand to fibular head    Lumbar - Left Side Bend hand to fibular head    Lumbar - Right Rotation 50% limited    Lumbar - Left Rotation 30% limited      Strength   Right Hip Flexion 4-/5    Right Hip Extension 4+/5    Right Hip External Rotation  3+/5    Right Hip Internal Rotation 4/5    Right Hip ABduction 4-/5    Right Hip ADduction 4-/5    Left Hip Flexion 4/5    Left Hip Extension 4+/5    Left Hip External Rotation 4-/5    Left Hip Internal Rotation 4/5    Left Hip ABduction 4-/5    Left Hip ADduction 4-/5    Right Knee Flexion 5/5    Right Knee Extension 5/5    Left Knee Flexion 5/5    Left Knee Extension 5/5    Right Ankle Dorsiflexion 3+/5    Right Ankle Plantar Flexion 3/5   unable to initiate single leg heel raise; 4/5 with manual resistance   Right Ankle Inversion 4-/5    Right Ankle Eversion 4/5    Left Ankle Dorsiflexion 2+/5    Left Ankle Plantar Flexion 3/5   unable to initiate single leg heel raise; 4-/5 with manual resistance   Left Ankle Inversion 2-/5    Left Ankle Eversion 3+/5      Flexibility   Soft Tissue Assessment /Muscle Length yes    Hamstrings mild/mod tightness B    Quadriceps mod tight  quads > hip flexors B    Piriformis mod tightness B       Special Tests    Special Tests Lumbar    Lumbar Tests Slump Test;Straight Leg Raise      Slump test   Findings Positive    Side Right   >Lt   Comment increased numbness & tingling      Straight Leg Raise   Findings Positive    Side  Right    Comment increased numbness      Ambulation/Gait   Ambulation/Gait Assistance 5: Supervision    Assistive device Other (Comment)   single walking/hiking pole   Gait Pattern Step-through pattern;Ataxic;Scissoring;Decreased dorsiflexion - left;Decreased dorsiflexion - right   L>R foot slap   Ambulation Surface Level;Indoor                        Objective measurements completed on examination: See above findings.                PT Education - 07/05/21 1410     Education Details PT eval findings and anticipated POC    Person(s) Educated Patient    Methods Explanation    Comprehension Verbalized understanding              PT Short Term Goals - 07/05/21 1411       PT SHORT TERM GOAL #1   Title Patient will be independent with initial HEP    Status New    Target Date 07/26/21      PT SHORT TERM GOAL #2   Title Patient will verbalize/demonstrate understanding of neutral spine posture and proper body mechanics to reduce strain on lumbar spine    Status New    Target Date 07/26/21               PT Long Term Goals - 07/05/21 1411       PT LONG TERM GOAL #1   Title Patient will be independent with ongoing/advanced HEP for self-management at home    Status New    Target Date 08/16/21      PT LONG TERM GOAL #2   Title Patient will demonstrate improved B hip strength to >/= 4/5 and B ankle strength to >/= 3+/5 to 4-/5  for improved stability and ease of mobility    Status New    Target Date 08/16/21      PT LONG TERM GOAL #3   Title Patient will ambulate with improved gait pattern with decreased B foot drop/slap and no evidence of instability/LOB    Status New    Target Date 08/16/21      PT  LONG TERM GOAL #4   Title Patient will report ability to resume prior level of walking for exercise w/o limitation due to LE radiculopathy    Status New    Target Date 08/16/21      PT LONG TERM GOAL #5   Title Patient will report improved ability to climb in/out of bath w/o feeling of instability    Status New    Target Date 08/16/21                    Plan - 07/05/21 1411     Clinical Impression Statement [name] Trujillo is a 80 y/o female who presents to OP PT for worsening lumbar radiculopathy. She is well-known to this therapist having recently completed a PT episode for impaired cervical ROM, impaired UE sensation and  mild/mod UE weakness along with balance impairments following C5-7 ACDF on 09/21/20 which was complicated by acute onset L knee pain mid episode. She was also treated in 2018 and 2020/2021 by this PT for multiple problems including abnormal gait with B foot drop/slap (L>R), peroneal neuropathy, LE weakness and gait instability with significant improvement noted in strength, balance and foot/ankle control by the end of the episodes. She feels that she is fully recovered as far as her neck is concerned, but feels that her legs are getting worse, with worsening of her B LE numbness and tingling creating increased difficulty with getting in/out of shower and reduced standing and walking tolerance along with increased fear of falling. She reports worsening NCV/EMG signals on recent assessment and MRI revealing multilevel degenerative changes including canal and foraminal stenosis. Current deficits include decreased lumbar ROM in all planes, limited proximal LE flexibility, impaired LE sensation, positive slump and SLR tests for worsening numbness and tingling, mild/mod proximal and distal LE weakness and ataxic gait with L>R foot slap. She does feel that her balance has declined but would benefit from further balance assessment to determine if decline has occurred as unsteadiness  observed during gait and lumbar ROM assessments. Given prior positive responses to PT, anticipate Braelynn will benefit from skilled PT to address above deficits and improve proximal LE flexibility and strength to and increase tolerance for mobility, walking and daily activities for improved QOL as well as improve safety with gait and mobility to reduce risk for falls.    Personal Factors and Comorbidities Time since onset of injury/illness/exacerbation;Past/Current Experience;Comorbidity 3+;Age;Transportation    Comorbidities C5-7 ACDF 09/21/20; idiopathic progressive neuropathy; hypthyroidism; DDD; remote h/o back surgery at age 52; h/o scoliosis    Examination-Activity Limitations Bathing;Bend;Caring for Others;Carry;Dressing;Lift;Locomotion Level;Squat;Stairs;Stand;Transfers    Examination-Participation Restrictions Cleaning;Community Activity;Driving;Laundry;Meal Prep;Shop;Yard Work;Interpersonal Relationship    Stability/Clinical Decision Making Unstable/Unpredictable    Clinical Decision Making High    Rehab Potential Good    PT Frequency 2x / week    PT Duration 6 weeks    PT Treatment/Interventions ADLs/Self Care Home Management;Cryotherapy;Electrical Stimulation;Moist Heat;Ultrasound;DME Instruction;Gait training;Stair training;Functional mobility training;Therapeutic activities;Therapeutic exercise;Balance training;Neuromuscular re-education;Patient/family education;Manual techniques;Passive range of motion;Dry needling;Energy conservation;Taping;Vestibular;Spinal Manipulations;Joint Manipulations    PT Next Visit Plan Lumbar FOTO; balance assessment; create initial HEP    Consulted and Agree with Plan of Care Patient             Patient will benefit from skilled therapeutic intervention in order to improve the following deficits and impairments:  Abnormal gait, Decreased activity tolerance, Decreased balance, Decreased coordination, Decreased endurance, Decreased knowledge of use of DME,  Decreased mobility, Decreased range of motion, Decreased safety awareness, Decreased strength, Difficulty walking, Increased fascial restricitons, Increased muscle spasms, Impaired perceived functional ability, Impaired flexibility, Impaired sensation, Dizziness, Impaired tone, Improper body mechanics, Postural dysfunction  Visit Diagnosis: Radiculopathy, lumbar region  Muscle weakness (generalized)  Foot drop, left  Foot drop, right  Other abnormalities of gait and mobility  Unsteadiness on feet     Problem List Patient Active Problem List   Diagnosis Date Noted   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

## 2021-07-10 ENCOUNTER — Ambulatory Visit: Payer: Medicare PPO | Admitting: Physical Therapy

## 2021-07-11 ENCOUNTER — Ambulatory Visit: Payer: Medicare PPO | Attending: Registered" | Admitting: Physical Therapy

## 2021-07-11 ENCOUNTER — Encounter: Payer: Self-pay | Admitting: Physical Therapy

## 2021-07-11 ENCOUNTER — Other Ambulatory Visit: Payer: Self-pay

## 2021-07-11 DIAGNOSIS — M5416 Radiculopathy, lumbar region: Secondary | ICD-10-CM | POA: Insufficient documentation

## 2021-07-11 DIAGNOSIS — R2681 Unsteadiness on feet: Secondary | ICD-10-CM | POA: Insufficient documentation

## 2021-07-11 DIAGNOSIS — R2689 Other abnormalities of gait and mobility: Secondary | ICD-10-CM | POA: Insufficient documentation

## 2021-07-11 DIAGNOSIS — M21371 Foot drop, right foot: Secondary | ICD-10-CM | POA: Insufficient documentation

## 2021-07-11 DIAGNOSIS — M6281 Muscle weakness (generalized): Secondary | ICD-10-CM | POA: Insufficient documentation

## 2021-07-11 DIAGNOSIS — M21372 Foot drop, left foot: Secondary | ICD-10-CM | POA: Insufficient documentation

## 2021-07-11 NOTE — Patient Instructions (Signed)
     Access Code: [email or web address] URL: [email or web address] Date: 07/11/2021 Prepared by: Glenetta Hew  Exercises Supine Hamstring Stretch with Strap - 2 x daily - 7 x weekly - 3 reps - 30 sec hold Supine ITB Stretch with Strap - 2 x daily - 7 x weekly - 3 reps - 30 sec hold Supine Quadriceps Stretch with Strap on Table - 2 x daily - 7 x weekly - 3 reps - 30 sec hold Supine Figure 4 Piriformis Stretch - 2 x daily - 7 x weekly - 3 reps - 30 sec hold Supine Piriformis Stretch with Foot on Ground - 2 x daily - 7 x weekly - 3 reps - 30 sec hold Supine Piriformis Stretch Pulling Heel to Hip - 2 x daily - 7 x weekly - 3 reps - 30 sec hold Gastroc Stretch on Wall - 2 x daily - 7 x weekly - 3 reps - 30 sec hold Soleus Stretch on Wall - 2 x daily - 7 x weekly - 3 reps - 30 sec hold Supine Transversus Abdominis Bracing - Hands on Stomach - 2 x daily - 7 x weekly - 2 sets - 10 reps - 5 sec hold Seated Transversus Abdominis Bracing - 2 x daily - 7 x weekly - 2 sets - 10 reps - 3 sec hold

## 2021-07-11 NOTE — Therapy (Signed)
[facility] Outpatient Rehabilitation [facility] [address]  [address] [place], [place], [address] Phone: [phone]   Fax:  [phone]  Physical Therapy Treatment  Patient Details  Name: [name] Trujillo MRN: [id number] Date of Birth: 09/03/1941 Referring Provider (PT): Rondel Oh, NP for [name], MD   Encounter Date: 07/11/2021   PT End of Session - 07/11/21 1145     Visit Number 2    Number of Visits 13    Date for PT Re-Evaluation 08/30/21   time frame extended to 8 weeks secondary to holidays and limited scheduling availability   Authorization Type [employer]    Authorization Time Period 07/11/21 - 08/30/21    Authorization - Visit Number 1    Authorization - Number of Visits 12    PT Start Time 1145    PT Stop Time 1241    PT Time Calculation (min) 56 min    Activity Tolerance Patient tolerated treatment well    Behavior During Therapy [facility] for tasks assessed/performed             Past Medical History:  Diagnosis Date   High cholesterol    Hypothyroidism    Neuropathy     Past Surgical History:  Procedure Laterality Date   APPENDECTOMY     6-18 yo   BACK SURGERY     Age 53   CESAREAN SECTION     ECTOPIC PREGNANCY SURGERY     LAPAROSCOPIC HYSTERECTOMY      There were no vitals filed for this visit.   Subjective Assessment - 07/11/21 1155     Subjective Pt reports she had cramps in her L thigh last night for the first time in a while.    Pertinent History 09/21/20 - ACDF C5-6-7    Diagnostic tests 06/12/21 - Lumbar MRI: Postoperative and degenerative changes of the lumbar spine as described, similar compared to July 28, 2020. At L2-L3, there is moderate spinal canal stenosis due to disc bulging, endplate osteophytic spurring, facet and ligamentum flavum hypertrophy. Milder spinal canal stenosis is seen at L3-L4. There is multilevel neural foraminal stenosis.    Patient Stated Goals "to keep me walking"    Currently in  Pain? No/denies                [facility] PT Assessment - 07/11/21 1145       Observation/Other Assessments   Focus on Therapeutic Outcomes (FOTO)  Lumbar: FS = 28, predicted D/C FS = 47      Ambulation/Gait   Ambulation/Gait Assistance 5: Supervision    Assistive device None;Other (Comment)   single walking/hiking pole   Gait Pattern Step-through pattern;Ataxic;Scissoring;Decreased dorsiflexion - left;Decreased dorsiflexion - right   L>R foot slap   Ambulation Surface Level;Indoor    Gait velocity 2.53 ft/sec w/o AD; 2.63 ft/sec with hiking pole      Standardized Balance Assessment   10 Meter Walk 12.94 sec w/o AD; 12.47 sec with hiking pole      Functional Gait  Assessment   Gait assessed  Yes    Gait Level Surface Walks 20 ft in less than 7 sec but greater than 5.5 sec, uses assistive device, slower speed, mild gait deviations, or deviates 6-10 in outside of the 12 in walkway width.    Change in Gait Speed Able to change speed, demonstrates mild gait deviations, deviates 6-10 in outside of the 12 in walkway width, or no gait deviations, unable to achieve a major change in  velocity, or uses a change in velocity, or uses an assistive device.    Gait with Horizontal Head Turns Performs head turns smoothly with slight change in gait velocity (eg, minor disruption to smooth gait path), deviates 6-10 in outside 12 in walkway width, or uses an assistive device.    Gait with Vertical Head Turns Performs task with slight change in gait velocity (eg, minor disruption to smooth gait path), deviates 6 - 10 in outside 12 in walkway width or uses assistive device    Gait and Pivot Turn Pivot turns safely in greater than 3 sec and stops with no loss of balance, or pivot turns safely within 3 sec and stops with mild imbalance, requires small steps to catch balance.    Step Over Obstacle Is able to step over one shoe box (4.5 in total height) without changing gait speed. No evidence of imbalance.     Gait with Narrow Base of Support Ambulates less than 4 steps heel to toe or cannot perform without assistance.    Gait with Eyes Closed Cannot walk 20 ft without assistance, severe gait deviations or imbalance, deviates greater than 15 in outside 12 in walkway width or will not attempt task.    Ambulating Backwards Walks 20 ft, slow speed, abnormal gait pattern, evidence for imbalance, deviates 10-15 in outside 12 in walkway width.    Steps Alternating feet, must use rail.    Total Score 15    FGA comment: < 19 = high risk fall                           [facility] Adult PT Treatment/Exercise - 07/11/21 1145       Exercises   Exercises Lumbar      Lumbar Exercises: Stretches   Passive Hamstring Stretch Right;Left;3 reps;30 seconds    Passive Hamstring Stretch Limitations supine with strap    Hip Flexor Stretch Right;Left;2 reps;30 seconds    Hip Flexor Stretch Limitations mod thomas with strap    ITB Stretch Right;Left;2 reps;30 seconds    ITB Stretch Limitations supine crossbody with strap    Piriformis Stretch Right;Left;2 reps;30 seconds    Piriformis Stretch Limitations hooklying KTOS    Figure 4 Stretch 2 reps;30 seconds;Supine;With overpressure    Figure 4 Stretch Limitations overpressure with ankle propped on knee & single leg figure-4 to chest    Gastroc Stretch Limitations verbal review of gastroc & soleus stretches at wall from prior HEP      Lumbar Exercises: Aerobic   Recumbent Bike L3 x 6 min      Lumbar Exercises: Seated   Other Seated Lumbar Exercises Abd bracing with hannds pressing gently into folded pillow on lap      Lumbar Exercises: Supine   Ab Set 10 reps;5 seconds    AB Set Limitations hooklying                     PT Education - 07/11/21 1241     Education Details Results of balance assessment as compared to prior testing; Initial lumbopelvic HEP - Access Code: [email or web address]    Person(s) Educated Patient    Methods  Explanation;Demonstration;Verbal cues;Tactile cues;Handout    Comprehension Verbalized understanding;Verbal cues required;Tactile cues required;Returned demonstration;Need further instruction              PT Short Term Goals - 07/11/21 1154       PT SHORT TERM GOAL #1  Title Patient will be independent with initial HEP    Status On-going    Target Date 07/26/21      PT SHORT TERM GOAL #2   Title Patient will verbalize/demonstrate understanding of neutral spine posture and proper body mechanics to reduce strain on lumbar spine    Status On-going    Target Date 07/26/21               PT Long Term Goals - 07/11/21 1155       PT LONG TERM GOAL #1   Title Patient will be independent with ongoing/advanced HEP for self-management at home    Status On-going    Target Date 08/30/21      PT LONG TERM GOAL #2   Title Patient will demonstrate improved B hip strength to >/= 4/5 and B ankle strength to >/= 3+/5 to 4-/5  for improved stability and ease of mobility    Status New    Target Date 08/30/21      PT LONG TERM GOAL #3   Title Patient will ambulate with improved gait pattern with decreased B foot drop/slap and no evidence of instability/LOB    Status New    Target Date 08/30/21      PT LONG TERM GOAL #4   Title Patient will report ability to resume prior level of walking for exercise w/o limitation due to LE radiculopathy    Status New    Target Date 08/30/21      PT LONG TERM GOAL #5   Title Patient will report improved ability to climb in/out of bath w/o feeling of instability    Status New    Target Date 08/30/21      PT LONG TERM GOAL #6   Title Patient will improve FGA score to >/= 190/30 to improve gait stability and reduce risk for falls    Baseline 15/30 on eval    Status New    Target Date 08/30/21                   Plan - 07/11/21 1220     Clinical Impression Statement Further gait and balance assessment completed with gait speed  essentially unchanged from discharge assessment at and of last PT episode, however FGA reduced to 15/30 from 19/30 indicating worsening risk for falls with return to high fall risk status. Initial lumbar HEP initiated today with emphasis on proximal LE stretching and core/abdominal muscle activation to promote improved lumbar stability. Stretching completed in supine to promote maintenance of neutral spine with core muscle activation initiated both in supine/hooklying and sitting in preparation for progression of lumbopelvic strengthening in upcoming visits. Kerrin able to provide return demonstration with cueing necessary to avoid accessory muscle activation during stretches to ensure adequate muscle relaxation into stretch positions.    Comorbidities C5-7 ACDF 09/21/20; idiopathic progressive neuropathy; hypthyroidism; DDD; remote h/o back surgery at age 51; h/o scoliosis    Rehab Potential Good    PT Frequency 2x / week    PT Duration 6 weeks    PT Treatment/Interventions ADLs/Self Care Home Management;Cryotherapy;Electrical Stimulation;Moist Heat;Ultrasound;DME Instruction;Gait training;Stair training;Functional mobility training;Therapeutic activities;Therapeutic exercise;Balance training;Neuromuscular re-education;Patient/family education;Manual techniques;Passive range of motion;Dry needling;Energy conservation;Taping;Vestibular;Spinal Manipulations;Joint Manipulations    PT Next Visit Plan Review initial HEP; progress lumbopelvic flexibility and strengthening    PT Home Exercise Plan Access Code: [email or web address]    Consulted and Agree with Plan of Care Patient  Patient will benefit from skilled therapeutic intervention in order to improve the following deficits and impairments:  Abnormal gait, Decreased activity tolerance, Decreased balance, Decreased coordination, Decreased endurance, Decreased knowledge of use of DME, Decreased mobility, Decreased range of motion, Decreased safety  awareness, Decreased strength, Difficulty walking, Increased fascial restricitons, Increased muscle spasms, Impaired perceived functional ability, Impaired flexibility, Impaired sensation, Dizziness, Impaired tone, Improper body mechanics, Postural dysfunction  Visit Diagnosis: Radiculopathy, lumbar region  Muscle weakness (generalized)  Foot drop, left  Foot drop, right  Other abnormalities of gait and mobility  Unsteadiness on feet     Problem List Patient Active Problem List   Diagnosis Date Noted   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 [address]  [address] [place], [place], [address] Phone: [phone]   Fax:  [phone]  Name: [name] Trujillo MRN: [id number] Date of Birth: Apr 09, 1941

## 2021-07-17 ENCOUNTER — Encounter: Payer: Self-pay | Admitting: Physical Therapy

## 2021-07-17 ENCOUNTER — Ambulatory Visit: Payer: Medicare PPO | Admitting: Physical Therapy

## 2021-07-17 ENCOUNTER — Other Ambulatory Visit: Payer: Self-pay

## 2021-07-17 DIAGNOSIS — R2689 Other abnormalities of gait and mobility: Secondary | ICD-10-CM

## 2021-07-17 DIAGNOSIS — M21372 Foot drop, left foot: Secondary | ICD-10-CM

## 2021-07-17 DIAGNOSIS — R2681 Unsteadiness on feet: Secondary | ICD-10-CM

## 2021-07-17 DIAGNOSIS — M5416 Radiculopathy, lumbar region: Secondary | ICD-10-CM | POA: Diagnosis not present

## 2021-07-17 DIAGNOSIS — M6281 Muscle weakness (generalized): Secondary | ICD-10-CM

## 2021-07-17 DIAGNOSIS — M21371 Foot drop, right foot: Secondary | ICD-10-CM

## 2021-07-17 NOTE — Patient Instructions (Signed)
    Access Code: [email or web address] URL: [email or web address] Date: 07/17/2021 Prepared by: Glenetta Hew  Exercises Seated Hamstring Stretch with Strap - 2 x daily - 7 x weekly - 3 reps - 30 sec hold Standing ITB Stretch - 2 x daily - 7 x weekly - 3 reps - 30 sec hold Supine Quadriceps Stretch with Strap on Table - 2 x daily - 7 x weekly - 3 reps - 30 sec hold Supine Figure 4 Piriformis Stretch - 2 x daily - 7 x weekly - 3 reps - 30 sec hold Supine Piriformis Stretch with Foot on Ground - 2 x daily - 7 x weekly - 3 reps - 30 sec hold Supine Piriformis Stretch Pulling Heel to Hip - 2 x daily - 7 x weekly - 3 reps - 30 sec hold Gastroc Stretch on Wall - 2 x daily - 7 x weekly - 3 reps - 30 sec hold Soleus Stretch on Wall - 2 x daily - 7 x weekly - 3 reps - 30 sec hold Supine Transversus Abdominis Bracing - Hands on Stomach - 2 x daily - 7 x weekly - 2 sets - 10 reps - 5 sec hold Seated Transversus Abdominis Bracing - 2 x daily - 7 x weekly - 2 sets - 10 reps - 3 sec hold Hooklying Isometric Clamshell - 1 x daily - 7 x weekly - 2 sets - 10 reps - 3 sec hold Supine March with Resistance Band - 1 x daily - 7 x weekly - 2 sets - 10 reps - 2-3 sec hold hold Supine Hip Adduction Isometric with Ball - 1 x daily - 7 x weekly - 2 sets - 10 reps - 5 sec hold Abdominal Press into [place] - 1 x daily - 7 x weekly - 2 sets - 10 reps - 3 sec hold

## 2021-07-17 NOTE — Therapy (Signed)
[facility] [address]  [address] [place], [place], [address] Phone: [phone]   Fax:  [phone]  Physical Therapy Treatment  Patient Details  Name: [name] Trujillo MRN: [id number] Date of Birth: 15-Sep-1940 Referring Provider (PT): Rondel Oh, NP for [name], MD   Encounter Date: 07/17/2021   PT End of Session - 07/17/21 1400     Visit Number 3    Number of Visits 13    Date for PT Re-Evaluation 08/30/21   time frame extended to 8 weeks secondary to holidays and limited scheduling availability   Authorization Type [employer]    Authorization Time Period 07/11/21 - 08/30/21    Authorization - Visit Number 2    Authorization - Number of Visits 12    PT Start Time 1400    PT Stop Time 1451    PT Time Calculation (min) 51 min    Activity Tolerance Patient tolerated treatment well    Behavior During Therapy [facility] for tasks assessed/performed             Past Medical History:  Diagnosis Date   High cholesterol    Hypothyroidism    Neuropathy     Past Surgical History:  Procedure Laterality Date   APPENDECTOMY     7-18 yo   BACK SURGERY     Age 80   CESAREAN SECTION     ECTOPIC PREGNANCY SURGERY     LAPAROSCOPIC HYSTERECTOMY      There were no vitals filed for this visit.   Subjective Assessment - 07/17/21 1404     Subjective Pt notes that she had some midline cervical/thoracic pain when she was doing the ITB stretch with the strap at home.    Pertinent History 09/21/20 - ACDF C5-6-7    Diagnostic tests 06/12/21 - Lumbar MRI: Postoperative and degenerative changes of the lumbar spine as described, similar compared to July 28, 2020. At L2-L3, there is moderate spinal canal stenosis due to disc bulging, endplate osteophytic spurring, facet and ligamentum flavum hypertrophy. Milder spinal canal stenosis is seen at L3-L4. There is multilevel neural foraminal stenosis.    Patient Stated Goals "to  keep me walking"    Currently in Pain? No/denies                               [facility] Adult PT Treatment/Exercise - 07/17/21 0001       Neck Exercises: Machines for Strengthening   UBE (Upper Arm Bike) L1.0 x 6 min (3' fwd/3' back)      Lumbar Exercises: Stretches   Passive Hamstring Stretch Right;Left;3 reps;30 seconds    Passive Hamstring Stretch Limitations seated hip hinge + strap for gastroc stretch    Standing Side Bend Right;Left;3 reps;30 seconds    Standing Side Bend Limitations ITB/QL stretch using counter for support      Lumbar Exercises: Aerobic   Recumbent Bike L3 x 4 min      Lumbar Exercises: Supine   Ab Set 10 reps;5 seconds    AB Set Limitations hooklying    Clam 10 reps;3 seconds    Clam Limitations TrA + hookying red TB bent-knee fall-out    Bent Knee Raise 10 reps;3 seconds    Bent Knee Raise Limitations TrA + red TB march    Large Ball Oblique Isometric 10 reps;3 seconds    Large Ball Oblique Isometric Limitations green/blue striped ball  Other Supine Lumbar Exercises PPT + hip ADD isometric ball squeeze 10 x 5"                     PT Education - 07/17/21 1450     Education Details HEP modification/update - alternatives for HS & ITB stretches, initial lumbopelvic strengthening - Access Code: [email or web address]    Person(s) Educated Patient    Methods Explanation;Demonstration;Verbal cues;Tactile cues;Handout    Comprehension Verbalized understanding;Verbal cues required;Tactile cues required;Returned demonstration;Need further instruction              PT Short Term Goals - 07/11/21 1154       PT SHORT TERM GOAL #1   Title Patient will be independent with initial HEP    Status On-going    Target Date 07/26/21      PT SHORT TERM GOAL #2   Title Patient will verbalize/demonstrate understanding of neutral spine posture and proper body mechanics to reduce strain on lumbar spine    Status On-going    Target Date 07/26/21                PT Long Term Goals - 07/17/21 1450       PT LONG TERM GOAL #1   Title Patient will be independent with ongoing/advanced HEP for self-management at home    Status On-going    Target Date 08/30/21      PT LONG TERM GOAL #2   Title Patient will demonstrate improved B hip strength to >/= 4/5 and B ankle strength to >/= 3+/5 to 4-/5  for improved stability and ease of mobility    Status On-going    Target Date 08/30/21      PT LONG TERM GOAL #3   Title Patient will ambulate with improved gait pattern with decreased B foot drop/slap and no evidence of instability/LOB    Status On-going    Target Date 08/30/21      PT LONG TERM GOAL #4   Title Patient will report ability to resume prior level of walking for exercise w/o limitation due to LE radiculopathy    Status On-going    Target Date 08/30/21      PT LONG TERM GOAL #5   Title Patient will report improved ability to climb in/out of bath w/o feeling of instability    Status New      PT LONG TERM GOAL #6   Title Patient will improve FGA score to >/= 190/30 to improve gait stability and reduce risk for falls    Baseline 15/30 on eval    Status On-going    Target Date 08/30/21                   Plan - 07/17/21 1451     Clinical Impression Statement Jannatul reports increased upper back discomfort since completing the initial HEP which she believes is related to the HS and ITB stretches with the strap where she has to support the weight of her LE, therefore provided instruction in alternative versions in sitting (HS) and standing (ITB) which seem to be better tolerated. She denies any issues or need for review with remaining stretches and exercises from initial HEP, therefore remainder of session focusing on progression of lumbopelvic strengthening and stabilization with PT modifying exercises as necessary to achieve desired muscle activation w/o creating increased discomfort or muscle strain. HEP updated to  reflect basic strengthening exercises.    Comorbidities C5-7 ACDF 09/21/20; idiopathic progressive neuropathy; hypthyroidism;  DDD; remote h/o back surgery at age 80; h/o scoliosis    Rehab Potential Good    PT Frequency 2x / week    PT Duration 6 weeks    PT Treatment/Interventions ADLs/Self Care Home Management;Cryotherapy;Electrical Stimulation;Moist Heat;Ultrasound;DME Instruction;Gait training;Stair training;Functional mobility training;Therapeutic activities;Therapeutic exercise;Balance training;Neuromuscular re-education;Patient/family education;Manual techniques;Passive range of motion;Dry needling;Energy conservation;Taping;Vestibular;Spinal Manipulations;Joint Manipulations    PT Next Visit Plan progress lumbopelvic flexibility and strengthening; review/update HEP PRN    PT Home Exercise Plan Access Code: [email or web address] (11/2, updated 11/8)    Consulted and Agree with Plan of Care Patient             Patient will benefit from skilled therapeutic intervention in order to improve the following deficits and impairments:  Abnormal gait, Decreased activity tolerance, Decreased balance, Decreased coordination, Decreased endurance, Decreased knowledge of use of DME, Decreased mobility, Decreased range of motion, Decreased safety awareness, Decreased strength, Difficulty walking, Increased fascial restricitons, Increased muscle spasms, Impaired perceived functional ability, Impaired flexibility, Impaired sensation, Dizziness, Impaired tone, Improper body mechanics, Postural dysfunction  Visit Diagnosis: Radiculopathy, lumbar region  Muscle weakness (generalized)  Foot drop, left  Foot drop, right  Other abnormalities of gait and mobility  Unsteadiness on feet     Problem List Patient Active Problem List   Diagnosis Date Noted   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   Ventricular premature beats 10/31/2015   Heart murmur 10/31/2015    Marry Guan, PT 07/17/2021, 3:21  PM  [facility] [address]  [address] [place], [place], [address] Phone: [phone]   Fax:  [phone]  Name: Tiny Chaudhary MRN: [id number] Date of Birth: 03-20-1941

## 2021-07-20 ENCOUNTER — Encounter: Payer: Self-pay | Admitting: Anesthesiology

## 2021-07-20 ENCOUNTER — Ambulatory Visit
Admission: RE | Admit: 2021-07-20 | Discharge: 2021-07-20 | Disposition: A | Discharge status: Home / Self Care | Payer: Medicare PPO | Source: Ambulatory Visit | Attending: Anesthesiology | Admitting: Anesthesiology

## 2021-07-20 VITALS — BP 152/75 | HR 111 | Temp 98.8°F | Ht 61.0 in | Wt 120.0 lb

## 2021-07-20 DIAGNOSIS — M5416 Radiculopathy, lumbar region: Secondary | ICD-10-CM | POA: Insufficient documentation

## 2021-07-20 DIAGNOSIS — M48062 Spinal stenosis, lumbar region with neurogenic claudication: Secondary | ICD-10-CM | POA: Insufficient documentation

## 2021-07-20 DIAGNOSIS — Z79899 Other long term (current) drug therapy: Secondary | ICD-10-CM | POA: Insufficient documentation

## 2021-07-20 MED ORDER — IOHEXOL 240 MG/ML IJ SOLN
3.0000 mL | Freq: Once | INTRAMUSCULAR | Status: AC
Start: 2021-07-20 — End: 2021-07-20
  Administered 2021-07-20: 3 mL

## 2021-07-20 MED ORDER — TRIAMCINOLONE ACETONIDE 40 MG/ML IJ SUSP
40.0000 mg | Freq: Once | INTRAMUSCULAR | Status: AC
Start: 2021-07-20 — End: 2021-07-20
  Administered 2021-07-20: 40 mg

## 2021-07-20 MED ORDER — LIDOCAINE HCL (PF) 1 % IJ SOLN
INTRAMUSCULAR | Status: AC
Start: 2021-07-20 — End: ?
  Filled 2021-07-20: qty 5

## 2021-07-20 MED ORDER — BUPIVACAINE HCL (PF) 0.25 % IJ SOLN
5.0000 mL | Freq: Once | INTRAMUSCULAR | Status: AC
Start: 2021-07-20 — End: 2021-07-20
  Administered 2021-07-20: 5 mL

## 2021-07-20 MED ORDER — TRIAMCINOLONE ACETONIDE 40 MG/ML IJ SUSP
INTRAMUSCULAR | Status: AC
Start: 2021-07-20 — End: ?
  Filled 2021-07-20: qty 1

## 2021-07-20 NOTE — Patient Instructions (Signed)
[facility]  [facility]  [facility]  [address], [address]  [place], [place] [address]  T: [phone]    What to expect    It is normal to feel improvement in pain for a few hours if local anesthetic used for the procedure.  If no local anesthetic used for the procedure, or once the local anesthetic wears off, you may feel worsening of your pain for a few days.  If you are tender at the injection site you may use an ice pack wrapped in a towel for twenty (20) minutes three to four times a day    The pain relief from the steroid typically takes 2 to 5 days to see an effect.    Instructions    No immersion in water for 24 hours (no bath, no hot tub, no Jacuzzi etc.)  May shower after 6 hours if desired    You may resume your normal diet and medication regimen unless directed otherwise by your provider.  If you are taking a blood thinner, ask when it is acceptable to restart as this will vary depending upon the medication and the procedure you underwent    You may resume regular activities as your comfort level allows, but do not do more than you would on a typical day    The skin in the area where your procedure was performed, will have a discoloration from the skin cleanser used for the procedure.  The current recommendation is not to wash this off immediately following the procedure, unless it is causing problems like itching or rash.  The disinfectant used is typically colored blue or orange.  This is normal and you may wash it off after 2 hours if desired.    You may remove bandage that we placed over the injection site after 2 hours, if desired    Potential Side effects    Temporary side effects of steroids could include:  Redness in the face  Swelling in the extremities  Insomnia  Increase in blood sugar level    If local anesthesia used for the procedure may have mild and temporary numbness or weakness.  This typically resolves in a few hours.  If  you notice severe numbness or weakness please call the office (or if after hours go to emergency room for evaluation).  If the numbness or weakness does not resolve after 12 hours please call the office.    When to call the office    Please call the office for the following:  Temperature greater than 100.3 F  Severe headache  Severe back pain does not go away with over-the-counter medications  New onset numbness or weakness that is progressively getting worse  Redness at the injection site that is getting bigger, or discharge from the injection site

## 2021-07-20 NOTE — Procedures (Signed)
[facility]  [facility]  Stephanie Castaneda [facility]  [address]  [place], [place] [address]  T: [phone] F: [phone]    Stephanie Castaneda   DOB: 1941/08/03   MRN: [id number]   Date of Service: July 20, 2021      Referring Physician: [name], MD  Pain Management Physician: Stephanie Bongo, MD    Procedure Note:    Diagnosis:   Lumbar Radiculopathy  Spinal Stenosis with Neurogenic claudication, Lumbar    Procedure: Right and Left L2-3 Transforaminal Epidural Steroid Injection with Fluoroscopic Guidance    Anesthesia: Local    Indications:     Stephanie Castaneda is a 80 year-old female who presents with persistent and refractory bilateral lower extremity numbness.  She presents today as a referral from Dr. Deloria Lair for epidural steroid injections at the L2-3 level to be separated at least 2 weeks apart then by ESI's at the L4-5 level to help diagnose whether her numbness is caused from stenosis at the L2-3 versus L4-5 levels.  If she has benefit that is short-lived Dr. Deloria Lair is then considering decompressive surgery.      Pain history:    Stephanie Castaneda has a long history of of low back pain issues have been intermittent and have resolved with conservative care.      When she was 80 years of age she needed a back surgery for a ruptured disc which had calcified.  At that time she had excruciating back pain, she needed a myelogram for testing. Around 2018 the patient started noticing numbness in the left leg, starting in the thigh and going down the leg.  She had detailed testing at [facility] which showed peripheral neuropathy for which she had detailed blood work which did not reveal any etiology.      The patient saw another neurologist in this area and was diagnosed with bilateral foot drop.  At that time she was also diagnosed with severe cervical spinal stenosis and she saw Dr. Deloria Lair.  She had surgery and noticed significant improvement  in her hand symptoms.  She was referred here by neurosurgery for numbness in the legs.  She started noticing numbness on the right thigh in 2021.  She has noticed worsening of the left foot drop as well.      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     Current Outpatient Medications   Medication Sig Dispense Refill    rosuvastatin (CRESTOR) 10 MG tablet Take 1 tablet (10 mg) by mouth daily      solifenacin (VESICARE) 10 MG tablet Take 1 tablet (10 mg) by mouth daily      Synthroid 88 MCG tablet Take 1 tablet (88 mcg) by mouth daily      b complex vitamins capsule Take 1 capsule by mouth daily (Patient not taking: Reported on 07/20/2021)      calcitonin, salmon, (MIACALCIN) 200 UNIT/ACT nasal spray 1 spray by Nasal route daily 3.7 mL 5    Misc. Devices Kit Bone stimulator (Patient not taking: Reported on 07/20/2021) 1 kit 0     Current Facility-Administered Medications   Medication Dose Route Frequency Provider Last Rate Last Admin    bupivacaine (PF) (MARCAINE) 0.25 % injection 5 mL  5 mL Other Once Stephanie Castaneda, Stephanie Dell, MD  iohexol (OMNIPAQUE) 240 MG/ML solution 3 mL  3 mL Other Once Stephanie Castaneda, Stephanie Dell, MD        triamcinolone acetonide (KENALOG-40) 40 MG/ML injection 40 mg  40 mg Other Once Stephanie Castaneda, Stephanie Dell, MD            Allergies   Allergen Reactions    Oxycodone Nausea And Vomiting    Hydrocodone Nausea And Vomiting        Social History     Tobacco Use    Smoking status: Never    Smokeless tobacco: Never   Vaping Use    Vaping Use: Never used   Substance Use Topics    Alcohol use: Not Currently    Drug use: Not Currently     Radiological Findings:   MRI of the lumbar spine dated July 28, 2020.     TECHNIQUE: MRI of the lumbar spine performed on a 3.0 Tesla scanner  without intravenous contrast.        FINDINGS:   There is a normal lumbar  lordosis. There are multilevel Schmorl's nodes.  The overall vertebral body heights are maintained. There is a 4 mm grade  1 retrolisthesis of L1 on L2. There is a 3 Miller grade 1 retrolisthesis  of L2 on L3. There is degenerative disc disease with multilevel loss of  disc height, disc bulging and endplate osteophytic spurring. The conus  medullaris is normal and terminates at L1.     At L1-L2, there is diffuse disc bulging and endplate osteophytic  spurring. There is facet hypertrophy. There is no spinal canal stenosis.  There is mild to moderate right neural foraminal stenosis.     At L2-L3, there is diffuse disc bulging and endplate osteophytic  spurring. There is facet and ligamentum flavum hypertrophy. There is  moderate spinal canal stenosis. There is mild bilateral neural foraminal  stenosis.     At L3-L4, there is diffuse disc bulging and endplate osteophytic  spurring. There is facet and ligamentum flavum hypertrophy. There is  mild spinal canal stenosis. There is mild bilateral neural foraminal  stenosis.     At L4-L5, there is irregularity of the lamina, suggestive of prior  laminectomy. There is diffuse disc bulging and endplate osteophytic  spurring. There is facet hypertrophy. There is no spinal canal stenosis.  There is mild right and moderate left neural foraminal stenosis.     At L5-S1, there is irregularity of the left lamina which may reflect  previous laminectomy. There is disc bulging and endplate osteophytic  spurring. There is facet hypertrophy. There is no spinal canal stenosis.  There is mild right and moderate left neural foraminal stenosis.     Overall, degenerative findings are similar compared to July 28, 2020.     IMPRESSION:      1.  Postoperative and degenerative changes of the lumbar spine as  described, similar compared to July 28, 2020. At L2-L3, there is  moderate spinal canal stenosis due to disc bulging, endplate osteophytic  spurring, facet and ligamentum flavum  hypertrophy. Milder spinal canal  stenosis is seen at L3-L4. There is multilevel neural foraminal  stenosis.    Physical Exam   Visit Vitals  BP 152/75 (BP Site: Left arm)   Pulse (!) 111   Temp 98.8 F (37.1 C) (Oral)   Ht 1.549 m (5\' 1" )   Wt 54.4 kg (120 lb)   SpO2 95%   BMI 22.67 kg/m        GENERAL:  Well developed, well nourished, no acute distress.  Normal mood and affect.   ABDOMEN: Non-distended.   NEUROLOGIC: Sensory and Motor exam grossly intact; gait -- normal  SPINE/MUSCULOSKELETAL:   ROM lumbar spine: decreased   Spinal curvature: normal   No palpable trigger points.   SKIN: No evidence of swelling, edema, rashes, or infection on exposed areas of skin.   PSYCHIATRIC: The patient is alert and oriented.  Affect is appropriate.  she does not appear sedated or somnolent on exam.     Description of Procedure:     The risk benefits and alternatives were reviewed and the patient agreed to proceed.  I had the patient either sign a written informed consent for serial procedures over the next six months or I confirmed there was a valid prior signed informed consent for serial procedures after confirming there were no further questions or concerns.  The patient was then taken to the procedure room and positioned prone.  I prepped the back with ChloraPrep, allowed adequate time for the ChloraPrep to dry, and then draped the back in usual sterile fashion.  I wore a mask and sterile gloves for the procedure.  Using fluoroscopic guidance identified the right and left L2-3 neural foramen and anesthetized the skin and underlying tissues above with 3 cc's 1% lidocaine.  I then advanced a 3-1/2 inch 23-gauge Quincke spinal needle into the neural foramen.  There were no paresthesias needle placement.  Injection of 2 cc's Omnipaque 240 outlined epidural spread.  There is no vascular runoff.  I then injected a solution containing a mixture of  1cc 1% lidocaine mixed with 20mg  depomedrol at each foramen.  I flushed the  needle before removal and had a sterile bandage applied to the needle insertion site.  Patient tolerated the procedures well and was taken to the recovery area in stable condition.    Fluoroscopy Data:   Refer to imaging section in Epic    Medications used for the procedure:  Lot numbers and amount used saved in Epic    Plan:     Home Exercise Program  Activity Modifications  Continue Pain Medications as currently prescribed  Follow up in 2-4 weeks for TFESI's at L4-5  Follow up with Dr Deloria Lair following second ESI      Glendell Docker, MD  [facility] Program Director for Pain Medicine  Assistant Professor, [employer] [facility]  Team Physician, Pain Medicine, [facility] Football Team  [facility]  [address]  [place], [place] [address]  T [phone]          This note was generated in part by the Epic EMR system/ Dragon speech recognition and may contain errors or omissions not intended by the user. Grammatical errors, random word insertions, deletions, pronoun errors and incomplete sentences are occasional consequences of this technology due to software limitations. Not all errors are caught or corrected. If there are questions or concerns about the content of this note or information contained within the body of this dictation they should be addressed directly with the author for clarification

## 2021-07-21 ENCOUNTER — Inpatient Hospital Stay: Admission: RE | Admit: 2021-07-21 | Payer: Self-pay | Source: Ambulatory Visit

## 2021-07-21 DIAGNOSIS — M5416 Radiculopathy, lumbar region: Secondary | ICD-10-CM

## 2021-07-21 DIAGNOSIS — M48062 Spinal stenosis, lumbar region with neurogenic claudication: Secondary | ICD-10-CM

## 2021-07-23 ENCOUNTER — Encounter: Payer: Medicare PPO | Admitting: Physical Therapy

## 2021-07-26 ENCOUNTER — Other Ambulatory Visit: Payer: Self-pay

## 2021-07-26 ENCOUNTER — Ambulatory Visit: Payer: Medicare PPO | Admitting: Physical Therapy

## 2021-07-26 ENCOUNTER — Encounter: Payer: Self-pay | Admitting: Physical Therapy

## 2021-07-26 DIAGNOSIS — M5416 Radiculopathy, lumbar region: Secondary | ICD-10-CM

## 2021-07-26 DIAGNOSIS — R2681 Unsteadiness on feet: Secondary | ICD-10-CM

## 2021-07-26 DIAGNOSIS — M21372 Foot drop, left foot: Secondary | ICD-10-CM

## 2021-07-26 DIAGNOSIS — R2689 Other abnormalities of gait and mobility: Secondary | ICD-10-CM

## 2021-07-26 DIAGNOSIS — M6281 Muscle weakness (generalized): Secondary | ICD-10-CM

## 2021-07-26 DIAGNOSIS — M21371 Foot drop, right foot: Secondary | ICD-10-CM

## 2021-07-26 NOTE — Patient Instructions (Signed)

## 2021-07-26 NOTE — Therapy (Signed)
[facility] [address]  [address] [place], [place], [address] Phone: [phone]   Fax:  [phone]  Physical Therapy Treatment  Patient Details  Name: [name] Trujillo MRN: [id number] Date of Birth: 1940-09-20 Referring Provider (PT): Rondel Oh, NP for [name] Beath, MD   Encounter Date: 07/26/2021   PT End of Session - 07/26/21 1317     Visit Number 4    Number of Visits 13    Date for PT Re-Evaluation 08/30/21   time frame extended to 8 weeks secondary to holidays and limited scheduling availability   Authorization Type [employer]    Authorization Time Period 07/11/21 - 08/30/21    Authorization - Visit Number 3    Authorization - Number of Visits 12    PT Start Time 1317    PT Stop Time 1403    PT Time Calculation (min) 46 min    Activity Tolerance Patient tolerated treatment well    Behavior During Therapy [facility] for tasks assessed/performed             Past Medical History:  Diagnosis Date   High cholesterol    Hypothyroidism    Neuropathy     Past Surgical History:  Procedure Laterality Date   APPENDECTOMY     30-18 yo   BACK SURGERY     Age 72   CESAREAN SECTION     ECTOPIC PREGNANCY SURGERY     LAPAROSCOPIC HYSTERECTOMY      There were no vitals filed for this visit.   Subjective Assessment - 07/26/21 1321     Subjective Pt reports she is 7 days s/p her first ESI and today is the day the MD told her to expect the most change - she notes less of a feeling of tightness in her leg (like her skin was too tight) but no change in the numbness.    Pertinent History 09/21/20 - ACDF C5-6-7    Diagnostic tests 06/12/21 - Lumbar MRI: Postoperative and degenerative changes of the lumbar spine as described, similar compared to July 28, 2020. At L2-L3, there is moderate spinal canal stenosis due to disc bulging, endplate osteophytic spurring, facet and ligamentum flavum hypertrophy. Milder spinal  canal stenosis is seen at L3-L4. There is multilevel neural foraminal stenosis.    Patient Stated Goals "to keep me walking"                               [facility] Adult PT Treatment/Exercise - 07/26/21 1317       Self-Care   Self-Care Posture    Posture Provided education in proper posture and body mechanics to reduce strain on lumbar spine with modifications to accommodate for balance deficits.      Lumbar Exercises: Aerobic   Recumbent Bike L3 x 6 min                     PT Education - 07/26/21 1400     Education Details Posture and body mechanics education to reduce strain on lumbar spine with modifications to accommodate for balance deficits.    Person(s) Educated Patient    Methods Explanation;Demonstration;Verbal cues;Tactile cues;Handout    Comprehension Verbalized understanding;Verbal cues required;Tactile cues required;Returned demonstration;Need further instruction              PT Short Term Goals - 07/26/21 1335       PT SHORT TERM GOAL #  1   Title Patient will be independent with initial HEP    Status On-going   07/26/21 - Pt reports limited opportunity to attempt HEP over the past week due to traveling to [place] for her [facility]   Target Date 07/26/21      PT SHORT TERM GOAL #2   Title Patient will verbalize/demonstrate understanding of neutral spine posture and proper body mechanics to reduce strain on lumbar spine    Status On-going   07/26/21 - Education provided today   Target Date 07/26/21               PT Long Term Goals - 07/26/21 1400       PT LONG TERM GOAL #1   Title Patient will be independent with ongoing/advanced HEP for self-management at home    Status On-going    Target Date 08/30/21      PT LONG TERM GOAL #2   Title Patient will demonstrate improved B hip strength to >/= 4/5 and B ankle strength to >/= 3+/5 to 4-/5  for improved stability and ease of mobility    Status On-going    Target Date 08/30/21       PT LONG TERM GOAL #3   Title Patient will ambulate with improved gait pattern with decreased B foot drop/slap and no evidence of instability/LOB    Status On-going    Target Date 08/30/21      PT LONG TERM GOAL #4   Title Patient will report ability to resume prior level of walking for exercise w/o limitation due to LE radiculopathy    Status On-going    Target Date 08/30/21      PT LONG TERM GOAL #5   Title Patient will report improved ability to climb in/out of bath w/o feeling of instability    Status On-going    Target Date 08/30/21      PT LONG TERM GOAL #6   Title Patient will improve FGA score to >/= 19/30 to improve gait stability and reduce risk for falls    Baseline 15/30 on eval    Status On-going    Target Date 08/30/21                   Plan - 07/26/21 1403     Clinical Impression Statement [name] Trujillo reports she is uncertain of how much of an effect the ESI 7 days ago may have had but she does note a decrease in the sensation of tightness in her leg (no change in numbness). Increased instability noted today with more dependence on walking pole but pt is unsure if this is a result of the ESI or her limited ability to work on her HEP due to traveling to [place] for the [facility] and fatigue upon her return. Focus of today's session was on education in proper posture and body mechanics to reduce strain on lumbar spine - handout provided with PT demonstrating proper positioning and movement patterns with modifications discussed and demonstrated to accommodate for balance deficits. [name] Trujillo verbalizes good understanding and notes several things she would like to try to see how they work for her at home, therefore will f/u as indicated next visit.    Comorbidities C5-7 ACDF 09/21/20; idiopathic progressive neuropathy; hypthyroidism; DDD; remote h/o back surgery at age 64; h/o scoliosis    Rehab Potential Good    PT Frequency 2x / week    PT Duration 6 weeks    PT Treatment/Interventions  ADLs/Self Care Home  Management;Cryotherapy;Electrical Stimulation;Moist Heat;Ultrasound;DME Instruction;Gait training;Stair training;Functional mobility training;Therapeutic activities;Therapeutic exercise;Balance training;Neuromuscular re-education;Patient/family education;Manual techniques;Passive range of motion;Dry needling;Energy conservation;Taping;Vestibular;Spinal Manipulations;Joint Manipulations    PT Next Visit Plan STG goal reassessment - initial HEP & posture/body mechanics review as indicated; progress lumbopelvic flexibility and strengthening; review/update HEP PRN    PT Home Exercise Plan Access Code: [email or web address] (11/2, updated 11/8)    Consulted and Agree with Plan of Care Patient             Patient will benefit from skilled therapeutic intervention in order to improve the following deficits and impairments:  Abnormal gait, Decreased activity tolerance, Decreased balance, Decreased coordination, Decreased endurance, Decreased knowledge of use of DME, Decreased mobility, Decreased range of motion, Decreased safety awareness, Decreased strength, Difficulty walking, Increased fascial restricitons, Increased muscle spasms, Impaired perceived functional ability, Impaired flexibility, Impaired sensation, Dizziness, Impaired tone, Improper body mechanics, Postural dysfunction  Visit Diagnosis: Radiculopathy, lumbar region  Muscle weakness (generalized)  Foot drop, left  Foot drop, right  Other abnormalities of gait and mobility  Unsteadiness on feet     Problem List Patient Active Problem List   Diagnosis Date Noted   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   Ventricular premature beats 10/31/2015   Heart murmur 10/31/2015    Marry Guan, PT 07/26/2021, 7:34 PM  [facility] [address]  [address] [place], [place], [address] Phone: [phone]   Fax:  [phone]  Name: [name] Trujillo MRN:  [id number] Date of Birth: 04-04-1941

## 2021-07-31 ENCOUNTER — Ambulatory Visit: Payer: Medicare PPO | Admitting: Physical Therapy

## 2021-08-06 ENCOUNTER — Encounter: Payer: Self-pay | Admitting: Anesthesiology

## 2021-08-07 ENCOUNTER — Encounter: Payer: Self-pay | Admitting: Physical Therapy

## 2021-08-07 ENCOUNTER — Ambulatory Visit: Payer: Medicare PPO | Admitting: Physical Therapy

## 2021-08-07 ENCOUNTER — Other Ambulatory Visit: Payer: Self-pay

## 2021-08-07 DIAGNOSIS — R2689 Other abnormalities of gait and mobility: Secondary | ICD-10-CM

## 2021-08-07 DIAGNOSIS — M5416 Radiculopathy, lumbar region: Secondary | ICD-10-CM

## 2021-08-07 DIAGNOSIS — R2681 Unsteadiness on feet: Secondary | ICD-10-CM

## 2021-08-07 DIAGNOSIS — M21371 Foot drop, right foot: Secondary | ICD-10-CM

## 2021-08-07 DIAGNOSIS — M21372 Foot drop, left foot: Secondary | ICD-10-CM

## 2021-08-07 DIAGNOSIS — M6281 Muscle weakness (generalized): Secondary | ICD-10-CM

## 2021-08-07 NOTE — Therapy (Signed)
[facility] [address]  [place], [place], [address] Phone: [phone]   Fax:  [phone]  Physical Therapy Treatment  Patient Details  Name: [name] Trujillo MRN: [id number] Date of Birth: Feb 05, 1941 Referring Provider (PT): Dorena Dew, NP for [name], MD   Encounter Date: 08/07/2021   PT End of Session - 08/07/21 1317     Visit Number 5    Number of Visits 13    Date for PT Re-Evaluation 08/30/21   time frame extended to 8 weeks secondary to holidays and limited scheduling availability   Authorization Type [employer]    Authorization Time Period 07/11/21 - 08/30/21    Authorization - Visit Number 4    Authorization - Number of Visits 12    PT Start Time 6962    PT Stop Time 1407    PT Time Calculation (min) 50 min    Activity Tolerance Patient tolerated treatment well    Behavior During Therapy [facility] for tasks assessed/performed             Past Medical History:  Diagnosis Date   High cholesterol    Hypothyroidism    Neuropathy     Past Surgical History:  Procedure Laterality Date   APPENDECTOMY     11-20 yo   BACK SURGERY     Age 55   CESAREAN SECTION     ECTOPIC PREGNANCY SURGERY     LAPAROSCOPIC HYSTERECTOMY      There were no vitals filed for this visit.   Subjective Assessment - 08/07/21 1320     Subjective Pt reports her balance seems to be more variable since the ESI and her R leg is maybe less numb overall but where numbness persists it seems more intense and heavier. Does not notice any difference in L leg. She reports increased incidence of urinary fecal incontinence/frequency over past 2 weeks.    Pertinent History 09/21/20 - ACDF C5-6-7    Diagnostic tests 06/12/21 - Lumbar MRI: Postoperative and degenerative changes of the lumbar spine as described, similar compared to July 28, 2020. At L2-L3, there is moderate spinal canal stenosis due to disc bulging, endplate  osteophytic spurring, facet and ligamentum flavum hypertrophy. Milder spinal canal stenosis is seen at L3-L4. There is multilevel neural foraminal stenosis.    Patient Stated Goals "to keep me walking"    Currently in Pain? No/denies                               [facility] Adult PT Treatment/Exercise - 08/07/21 1317       Lumbar Exercises: Aerobic   UBE (Upper Arm Bike) L3.0 x 8 min (4' fwd/4' back)      Lumbar Exercises: Seated   Other Seated Lumbar Exercises TrA & alternating oblique abd bracing with hands pressing gently into blue/green striped ball on lap 10 x 3-5" - 1 set each      Lumbar Exercises: Supine   Dead Bug 10 reps;3 seconds    Dead Bug Limitations from hooklying    Isometric Hip Flexion 10 reps;5 seconds    Isometric Hip Flexion Limitations unliateral & bilateral    Large Ball Oblique Isometric 10 reps;5 seconds    Large Ball Oblique Isometric Limitations oblique isometric w/o ball      Knee/Hip Exercises: Seated   Ball Squeeze TrA + pelvic floor + hip ADD isometric 10 x 5"  Ankle Exercises: Seated   Other Seated Ankle Exercises Provided cues for proper posture including abd bracing and scap retraction for core stabilization while holding band for 4-way ankle strengthening                     PT Education - 08/07/21 1405     Education Details HEP update - lumbopelvic strengthening progression - Access Code: [email or web address]    Person(s) Educated Patient    Methods Explanation;Demonstration;Verbal cues;Tactile cues;Handout    Comprehension Verbalized understanding;Verbal cues required;Tactile cues required;Returned demonstration;Need further instruction              PT Short Term Goals - 08/07/21 1323       PT SHORT TERM GOAL #1   Title Patient will be independent with initial HEP    Status Achieved   08/07/21   Target Date --      PT SHORT TERM GOAL #2   Title Patient will verbalize/demonstrate understanding of neutral spine  posture and proper body mechanics to reduce strain on lumbar spine    Status Achieved   08/07/21              PT Long Term Goals - 07/26/21 1400       PT LONG TERM GOAL #1   Title Patient will be independent with ongoing/advanced HEP for self-management at home    Status On-going    Target Date 08/30/21      PT LONG TERM GOAL #2   Title Patient will demonstrate improved B hip strength to >/= 4/5 and B ankle strength to >/= 3+/5 to 4-/5  for improved stability and ease of mobility    Status On-going    Target Date 08/30/21      PT LONG TERM GOAL #3   Title Patient will ambulate with improved gait pattern with decreased B foot drop/slap and no evidence of instability/LOB    Status On-going    Target Date 08/30/21      PT LONG TERM GOAL #4   Title Patient will report ability to resume prior level of walking for exercise w/o limitation due to LE radiculopathy    Status On-going    Target Date 08/30/21      PT LONG TERM GOAL #5   Title Patient will report improved ability to climb in/out of bath w/o feeling of instability    Status On-going    Target Date 08/30/21      PT LONG TERM GOAL #6   Title Patient will improve FGA score to >/= 19/30 to improve gait stability and reduce risk for falls    Baseline 15/30 on eval    Status On-going    Target Date 08/30/21                   Plan - 08/07/21 1407     Clinical Impression Statement Kellyjo reports decreased sensation of tightness in R upper LE and potentially decreased numbness in her R LE since the first ESI, although where numbness persists it seems more intense and heavier. She notes worsening of her urinary urge incontinence and has started experiencing some fecal incontinence correlating with the urinary incontinence over the past 2 weeks - encouraged pt to bring this to her MD's attention. She also notes more variability with her balance, sometimes walking off w/o walking pole with no sense of imbalance and  other times more dependent on walking pole. She expresses good understanding of posture and body mechanics  training provided last visit (STG #2 met) but admits to difficulty incorporating techniques with some activities such as gardening earlier today. She denies issues with recent HEP exercises (STG #1 met) but expressed concerns regarding her ability to complete her previously provided ankle strengthening exercises due to strain on neck and back from anchoring the theraband - cues provided for abdominal bracing and postural stabilization while anchoring the band with better tolerance noted. Progressed core/abdominal strengthening, adding pelvic floor contractions to promote better control of incontinence - pt initially reporting difficulty recognizing desired muscle activation/contraction but improved with verbal and tactile cueing as well as varying positioning for exercise performance.    Comorbidities C5-7 ACDF 09/21/20; idiopathic progressive neuropathy; hypthyroidism; DDD; remote h/o back surgery at age 82; h/o scoliosis    Rehab Potential Good    PT Frequency 2x / week    PT Duration 6 weeks    PT Treatment/Interventions ADLs/Self Care Home Management;Cryotherapy;Electrical Stimulation;Moist Heat;Ultrasound;DME Instruction;Gait training;Stair training;Functional mobility training;Therapeutic activities;Therapeutic exercise;Balance training;Neuromuscular re-education;Patient/family education;Manual techniques;Passive range of motion;Dry needling;Energy conservation;Taping;Vestibular;Spinal Manipulations;Joint Manipulations    PT Next Visit Plan progress lumbopelvic flexibility and strengthening incorporating pelvic floor as indicated; review/update HEP PRN; posture/body mechanics review as indicated    PT Home Exercise Plan Access Code: [email or web address] (11/2, updated 11/8 & 11/29)    Consulted and Agree with Plan of Care Patient             Patient will benefit from skilled therapeutic intervention in  order to improve the following deficits and impairments:  Abnormal gait, Decreased activity tolerance, Decreased balance, Decreased coordination, Decreased endurance, Decreased knowledge of use of DME, Decreased mobility, Decreased range of motion, Decreased safety awareness, Decreased strength, Difficulty walking, Increased fascial restricitons, Increased muscle spasms, Impaired perceived functional ability, Impaired flexibility, Impaired sensation, Dizziness, Impaired tone, Improper body mechanics, Postural dysfunction  Visit Diagnosis: Radiculopathy, lumbar region  Muscle weakness (generalized)  Foot drop, left  Foot drop, right  Other abnormalities of gait and mobility  Unsteadiness on feet     Problem List Patient Active Problem List   Diagnosis Date Noted   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   Ventricular premature beats 10/31/2015   Heart murmur 10/31/2015    Percival Spanish, PT 08/07/2021, 8:37 PM  [facility] [address]  [place], [place], [address] Phone: [phone]   Fax:  [phone]  Name: Kayleena Eke MRN: [id number] Date of Birth: 07/22/1941

## 2021-08-07 NOTE — Patient Instructions (Signed)
   Access Code: [email or web address] URL: [email or web address] Date: 08/07/2021 Prepared by: Glenetta Hew  Exercises Seated Hamstring Stretch with Strap - 2 x daily - 7 x weekly - 3 reps - 30 sec hold Standing ITB Stretch - 2 x daily - 7 x weekly - 3 reps - 30 sec hold Supine Quadriceps Stretch with Strap on Table - 2 x daily - 7 x weekly - 3 reps - 30 sec hold Supine Figure 4 Piriformis Stretch - 2 x daily - 7 x weekly - 3 reps - 30 sec hold Supine Piriformis Stretch with Foot on Ground - 2 x daily - 7 x weekly - 3 reps - 30 sec hold Supine Piriformis Stretch Pulling Heel to Hip - 2 x daily - 7 x weekly - 3 reps - 30 sec hold Gastroc Stretch on Wall - 2 x daily - 7 x weekly - 3 reps - 30 sec hold Soleus Stretch on Wall - 2 x daily - 7 x weekly - 3 reps - 30 sec hold Supine Transversus Abdominis Bracing - Hands on Stomach - 2 x daily - 7 x weekly - 2 sets - 10 reps - 5 sec hold Seated Transversus Abdominis Bracing - 2 x daily - 7 x weekly - 2 sets - 10 reps - 3 sec hold Hooklying Isometric Clamshell - 1 x daily - 7 x weekly - 2 sets - 10 reps - 3 sec hold Supine March with Resistance Band - 1 x daily - 7 x weekly - 2 sets - 10 reps - 2-3 sec hold hold Supine Hip Adduction Isometric with Ball - 1 x daily - 7 x weekly - 2 sets - 10 reps - 5 sec hold Abdominal Press into [place] - 1 x daily - 7 x weekly - 2 sets - 10 reps - 3 sec hold Hooklying Isometric Hip Flexion - 1 x daily - 5 x weekly - 2 sets - 10 reps - 5 sec hold Hooklying Isometric Hip Flexion with Opposite Arm - 1 x daily - 5 x weekly - 2 sets - 10 reps - 5 sec hold Supine Dead Bug with Leg Extension - 1 x daily - 5 x weekly - 2 sets - 10 reps - 3 sec hold Hooklying Sequential Leg March and Lower - 1 x daily - 5 x weekly - 2 sets - 10 reps - 3 sec hold  Patient Education Hospital doctor

## 2021-08-09 ENCOUNTER — Encounter: Payer: Self-pay | Admitting: Physical Therapy

## 2021-08-09 ENCOUNTER — Ambulatory Visit: Payer: Medicare PPO | Attending: Registered" | Admitting: Physical Therapy

## 2021-08-09 ENCOUNTER — Other Ambulatory Visit: Payer: Self-pay

## 2021-08-09 DIAGNOSIS — R2689 Other abnormalities of gait and mobility: Secondary | ICD-10-CM | POA: Diagnosis present

## 2021-08-09 DIAGNOSIS — R2681 Unsteadiness on feet: Secondary | ICD-10-CM

## 2021-08-09 DIAGNOSIS — M21371 Foot drop, right foot: Secondary | ICD-10-CM | POA: Diagnosis present

## 2021-08-09 DIAGNOSIS — M21372 Foot drop, left foot: Secondary | ICD-10-CM

## 2021-08-09 DIAGNOSIS — M6281 Muscle weakness (generalized): Secondary | ICD-10-CM | POA: Diagnosis present

## 2021-08-09 DIAGNOSIS — M5416 Radiculopathy, lumbar region: Secondary | ICD-10-CM

## 2021-08-09 NOTE — Therapy (Signed)
[facility] [address]  [address] [place], [place], [address] Phone: [phone]   Fax:  [phone]  Physical Therapy Treatment  Patient Details  Name: [name] Trujillo MRN: [id number] Date of Birth: 23-Feb-1941 Referring Provider (PT): Rondel Oh, NP for [name], MD   Encounter Date: 08/09/2021   PT End of Session - 08/09/21 1317     Visit Number 6    Number of Visits 13    Date for PT Re-Evaluation 08/30/21   time frame extended to 8 weeks secondary to holidays and limited scheduling availability   Authorization Type [employer]    Authorization Time Period 07/11/21 - 08/30/21    Authorization - Visit Number 5    Authorization - Number of Visits 12    PT Start Time 1317    PT Stop Time 1403    PT Time Calculation (min) 46 min    Activity Tolerance Patient tolerated treatment well    Behavior During Therapy [facility] for tasks assessed/performed             Past Medical History:  Diagnosis Date   High cholesterol    Hypothyroidism    Neuropathy     Past Surgical History:  Procedure Laterality Date   APPEN[age]CTOMY     78-20 yo   BACK SURGERY     Age 80   CESAREAN SECTION     ECTOPIC PREGNANCY SURGERY     LAPAROSCOPIC HYSTERECTOMY      There were no vitals filed for this visit.   Subjective Assessment - 08/09/21 1336     Subjective Pt reports she has not been able to attempt the new HEP exercises as she ended having to go to the ER with her husband after a fall, and in the choas she misplaced the HEP handout. That night she reports she reports she experience B full LE muscle spasms while in bed that took over a minute to subside. She does note she was able to clean her bathroom yesterday with an easier time getting up from the floor than she would have previously.    Pertinent History 09/21/20 - ACDF C5-6-7    Diagnostic tests 06/12/21 - Lumbar MRI: Postoperative and degenerative changes of the lumbar  spine as described, similar compared to July 28, 2020. At L2-L3, there is moderate spinal canal stenosis due to disc bulging, endplate osteophytic spurring, facet and ligamentum flavum hypertrophy. Milder spinal canal stenosis is seen at L3-L4. There is multilevel neural foraminal stenosis.    Patient Stated Goals "to keep me walking"    Currently in Pain? No/denies                               [facility] Adult PT Treatment/Exercise - 08/09/21 1317       Exercises   Exercises Lumbar      Lumbar Exercises: Aerobic   UBE (Upper Arm Bike) L3.0 x 8 min (4' fwd/4' back)      Lumbar Exercises: Seated   Other Seated Lumbar Exercises TrA & alternating oblique abd bracing with hands pressing gently into knee 10 x 3-5" - 1 set each      Lumbar Exercises: Supine   Bent Knee Raise 10 reps;3 seconds    Bent Knee Raise Limitations sequential march to 90/90 position & lower    Dead Bug 10 reps;3 seconds    Dead Bug Limitations from hooklying  Isometric Hip Flexion 10 reps;5 seconds    Isometric Hip Flexion Limitations unilateral & bilateral    Large Ball Oblique Isometric 10 reps;5 seconds    Large Ball Oblique Isometric Limitations oblique isometric w/o ball      Ankle Exercises: Seated   Other Seated Ankle Exercises Reviewed proper posture including abd bracing and scap retraction for core stabilization while holding band for 4-way ankle strengthening - green TB for PF 2 x 10, yellow TB for EV and yellow TB to no band for DF x 10                       PT Short Term Goals - 08/07/21 1323       PT SHORT TERM GOAL #1   Title Patient will be independent with initial HEP    Status Achieved   08/07/21   Target Date --      PT SHORT TERM GOAL #2   Title Patient will verbalize/demonstrate understanding of neutral spine posture and proper body mechanics to reduce strain on lumbar spine    Status Achieved   08/07/21              PT Long Term Goals -  07/26/21 1400       PT LONG TERM GOAL #1   Title Patient will be independent with ongoing/advanced HEP for self-management at home    Status On-going    Target Date 08/30/21      PT LONG TERM GOAL #2   Title Patient will demonstrate improved B hip strength to >/= 4/5 and B ankle strength to >/= 3+/5 to 4-/5  for improved stability and ease of mobility    Status On-going    Target Date 08/30/21      PT LONG TERM GOAL #3   Title Patient will ambulate with improved gait pattern with decreased B foot drop/slap and no evidence of instability/LOB    Status On-going    Target Date 08/30/21      PT LONG TERM GOAL #4   Title Patient will report ability to resume prior level of walking for exercise w/o limitation due to LE radiculopathy    Status On-going    Target Date 08/30/21      PT LONG TERM GOAL #5   Title Patient will report improved ability to climb in/out of bath w/o feeling of instability    Status On-going    Target Date 08/30/21      PT LONG TERM GOAL #6   Title Patient will improve FGA score to >/= 19/30 to improve gait stability and reduce risk for falls    Baseline 15/30 on eval    Status On-going    Target Date 08/30/21                   Plan - 08/09/21 1355     Clinical Impression Statement Kree reports she was not able to attempt the new HEP exercises as she misplaced the new handout when she had to take her husband to the ER after a fall following her last PT appointment. Reprint of handout provided and new exercises reviewed along with postural muscle and core/abdominal muscle activation related to stabilization of spine while holding resistant bands for ankle exercises. Pt able to demonstrate increased L ankle eversion today but still with limited DF and minimal to no inversion. Dannette remains uncertain of benefit from L2-3 ESI but did note increased ease of getting up  from floor after cleaning her bathroom yesterday. She is scheduled for her 2nd round of  ESI to L4-5 tomorrow.    Comorbidities C5-7 ACDF 09/21/20; idiopathic progressive neuropathy; hypthyroidism; DDD; remote h/o back surgery at age 5; h/o scoliosis    Rehab Potential Good    PT Frequency 2x / week    PT Duration 6 weeks    PT Treatment/Interventions ADLs/Self Care Home Management;Cryotherapy;Electrical Stimulation;Moist Heat;Ultrasound;DME Instruction;Gait training;Stair training;Functional mobility training;Therapeutic activities;Therapeutic exercise;Balance training;Neuromuscular re-education;Patient/family education;Manual techniques;Passive range of motion;Dry needling;Energy conservation;Taping;Vestibular;Spinal Manipulations;Joint Manipulations    PT Next Visit Plan progress lumbopelvic flexibility and strengthening incorporating pelvic floor as indicated; review/update HEP PRN; posture/body mechanics review as indicated    PT Home Exercise Plan Access Code: [email or web address] (11/2, updated 11/8 & 11/29)    Consulted and Agree with Plan of Care Patient             Patient will benefit from skilled therapeutic intervention in order to improve the following deficits and impairments:  Abnormal gait, Decreased activity tolerance, Decreased balance, Decreased coordination, Decreased endurance, Decreased knowledge of use of DME, Decreased mobility, Decreased range of motion, Decreased safety awareness, Decreased strength, Difficulty walking, Increased fascial restricitons, Increased muscle spasms, Impaired perceived functional ability, Impaired flexibility, Impaired sensation, Dizziness, Impaired tone, Improper body mechanics, Postural dysfunction  Visit Diagnosis: Radiculopathy, lumbar region  Muscle weakness (generalized)  Foot drop, left  Foot drop, right  Other abnormalities of gait and mobility  Unsteadiness on feet     Problem List Patient Active Problem List   Diagnosis Date Noted   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   Ventricular premature beats  10/31/2015   Heart murmur 10/31/2015    Marry Guan, PT 08/09/2021, 8:05 PM  [facility] [address]  [address] [place], [place], [address] Phone: [phone]   Fax:  [phone]  Name: Ajeenah Heiny MRN: [id number] Date of Birth: [birth year]

## 2021-08-10 ENCOUNTER — Ambulatory Visit
Admission: RE | Admit: 2021-08-10 | Discharge: 2021-08-10 | Disposition: A | Discharge status: Home / Self Care | Payer: Medicare PPO | Source: Ambulatory Visit | Attending: Anesthesiology | Admitting: Anesthesiology

## 2021-08-10 ENCOUNTER — Encounter: Payer: Self-pay | Admitting: Anesthesiology

## 2021-08-10 DIAGNOSIS — M5416 Radiculopathy, lumbar region: Secondary | ICD-10-CM

## 2021-08-10 DIAGNOSIS — M48062 Spinal stenosis, lumbar region with neurogenic claudication: Secondary | ICD-10-CM

## 2021-08-10 DIAGNOSIS — M21372 Foot drop, left foot: Secondary | ICD-10-CM | POA: Insufficient documentation

## 2021-08-10 DIAGNOSIS — M4726 Other spondylosis with radiculopathy, lumbar region: Secondary | ICD-10-CM | POA: Insufficient documentation

## 2021-08-10 DIAGNOSIS — M5116 Intervertebral disc disorders with radiculopathy, lumbar region: Secondary | ICD-10-CM | POA: Insufficient documentation

## 2021-08-10 DIAGNOSIS — Z885 Allergy status to narcotic agent status: Secondary | ICD-10-CM | POA: Insufficient documentation

## 2021-08-10 DIAGNOSIS — Z79899 Other long term (current) drug therapy: Secondary | ICD-10-CM | POA: Insufficient documentation

## 2021-08-10 MED ORDER — ROPIVACAINE HCL 2 MG/ML IJ SOLN
INTRAMUSCULAR | Status: AC
Start: 2021-08-10 — End: ?
  Filled 2021-08-10: qty 20

## 2021-08-10 MED ORDER — LIDOCAINE HCL (PF) 1 % IJ SOLN
3.0000 mL | Freq: Once | INTRAMUSCULAR | Status: AC
Start: 2021-08-10 — End: 2021-08-10
  Administered 2021-08-10: 3 mL

## 2021-08-10 MED ORDER — IOHEXOL 240 MG/ML IJ SOLN
3.0000 mL | Freq: Once | INTRAMUSCULAR | Status: AC
Start: 2021-08-10 — End: 2021-08-10
  Administered 2021-08-10: 3 mL

## 2021-08-10 MED ORDER — TRIAMCINOLONE ACETONIDE 40 MG/ML IJ SUSP
40.0000 mg | Freq: Once | INTRAMUSCULAR | Status: AC
Start: 2021-08-10 — End: 2021-08-10
  Administered 2021-08-10: 40 mg

## 2021-08-10 MED ORDER — TRIAMCINOLONE ACETONIDE 40 MG/ML IJ SUSP
INTRAMUSCULAR | Status: AC
Start: 2021-08-10 — End: ?
  Filled 2021-08-10: qty 1

## 2021-08-10 NOTE — Patient Instructions (Signed)
[facility]  [facility]  [facility]  [address], [address]  [place], [place] [address]  T: [phone]    What to expect    It is normal to feel improvement in pain for a few hours if local anesthetic used for the procedure.  If no local anesthetic used for the procedure, or once the local anesthetic wears off, you may feel worsening of your pain for a few days.  If you are tender at the injection site you may use an ice pack wrapped in a towel for twenty (20) minutes three to four times a day    The pain relief from the steroid typically takes 2 to 5 days to see an effect.    Instructions    No immersion in water for 24 hours (no bath, no hot tub, no Jacuzzi etc.)  May shower after 6 hours if desired    You may resume your normal diet and medication regimen unless directed otherwise by your provider.  If you are taking a blood thinner, ask when it is acceptable to restart as this will vary depending upon the medication and the procedure you underwent    You may resume regular activities as your comfort level allows, but do not do more than you would on a typical day    The skin in the area where your procedure was performed, will have a discoloration from the skin cleanser used for the procedure.  The current recommendation is not to wash this off immediately following the procedure, unless it is causing problems like itching or rash.  The disinfectant used is typically colored blue or orange.  This is normal and you may wash it off after 2 hours if desired.    You may remove bandage that we placed over the injection site after 2 hours, if desired    Potential Side effects    Temporary side effects of steroids could include:  Redness in the face  Swelling in the extremities  Insomnia  Increase in blood sugar level    If local anesthesia used for the procedure may have mild and temporary numbness or weakness.  This typically resolves in a few hours.  If  you notice severe numbness or weakness please call the office (or if after hours go to emergency room for evaluation).  If the numbness or weakness does not resolve after 12 hours please call the office.    When to call the office    Please call the office for the following:  Temperature greater than 100.3 F  Severe headache  Severe back pain that does not go away with over-the-counter medications  New onset numbness or weakness that is progressively getting worse  Redness at the injection site that is getting bigger, or discharge from the injection site

## 2021-08-10 NOTE — Procedures (Signed)
[facility]  [facility]  [facility]  [address]  [place], [place] [address]  T: [phone] F: [phone]    Stephanie Castaneda   DOB: [birth year]   MRN: [id number]   Date of Service: August 10, 2021      Referring Physician: [name], MD  Pain Management Physician: Karmen Bongo, MD  PCP: Pcp, None, MD    Procedure Note:    Diagnosis:   Lumbar Radiculopathy  Spinal Stenosis with Neurogenic claudication, Lumbar     Procedure: Right and Left L4-5 Transforaminal Epidural Steroid Injection with Fluoroscopic Guidance     Anesthesia: Local     Indications:      Stephanie Castaneda is a 80 year-old female who presents with persistent and refractory bilateral lower extremity numbness.  She presents today as a referral from Dr. Deloria Lair for epidural steroid injections at the L2-3 level to be separated at least 2 weeks apart then by ESI's at the L4-5 level to help diagnose whether her numbness is caused from stenosis at the L2-3 versus L4-5 levels.  If she has benefit that is short-lived Dr. Deloria Lair is then considering decompressive surgery.       Interval History:    Stephanie Castaneda underwent TFESI's at L2-3 on July 20, 2021,  She notes about a 20-30% improvement in her symptoms.  She presents today for injections at L4-5.    Pain history:     Stephanie Castaneda has a long history of of low back pain issues have been intermittent and have resolved with conservative care.       When she was 80 years of age she needed a back surgery for a ruptured disc which had calcified.  At that time she had excruciating back pain, she needed a myelogram for testing. Around 2018 the patient started noticing numbness in the left leg, starting in the thigh and going down the leg.  She had detailed testing at [facility] which showed peripheral neuropathy for which she had detailed blood work which did not reveal any etiology.       The patient saw another  neurologist in this area and was diagnosed with bilateral foot drop.  At that time she was also diagnosed with severe cervical spinal stenosis and she saw Dr. Deloria Lair.  She had surgery and noticed significant improvement in her hand symptoms.  She was referred here by neurosurgery for numbness in the legs.  She started noticing numbness on the right thigh in 2021.  She has noticed worsening of the left foot drop as well.       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     Current Outpatient Medications   Medication Sig Dispense Refill    rosuvastatin (CRESTOR) 10 MG tablet Take 1 tablet (10 mg) by mouth daily      solifenacin (VESICARE) 10 MG tablet Take 1 tablet (10 mg) by mouth daily      Synthroid 88 MCG tablet Take 1 tablet (88 mcg) by mouth daily       Current Facility-Administered Medications   Medication Dose Route Frequency Provider Last Rate Last Admin    iohexol (OMNIPAQUE) 240 MG/ML solution 3 mL  3 mL Other Once Noga Fogg, Gloriajean Dell, MD        lidocaine (PF) (XYLOCAINE) 1 %  injection 3 mL  3 mL Other Once Amariya Liskey, Gloriajean Dell, MD        triamcinolone acetonide (KENALOG-40) 40 MG/ML injection 40 mg  40 mg Other Once Elecia Serafin, Gloriajean Dell, MD            Allergies   Allergen Reactions    Oxycodone Nausea And Vomiting    Hydrocodone Nausea And Vomiting        Social History     Tobacco Use    Smoking status: Never    Smokeless tobacco: Never   Vaping Use    Vaping Use: Never used   Substance Use Topics    Alcohol use: Not Currently    Drug use: Not Currently       Radiological Findings:   MRI of the lumbar spine dated July 28, 2020.     TECHNIQUE: MRI of the lumbar spine performed on a 3.0 Tesla scanner  without intravenous contrast.        FINDINGS:   There is a normal lumbar lordosis. There are multilevel Schmorl's nodes.  The overall vertebral body  heights are maintained. There is a 4 mm grade  1 retrolisthesis of L1 on L2. There is a 3 Miller grade 1 retrolisthesis  of L2 on L3. There is degenerative disc disease with multilevel loss of  disc height, disc bulging and endplate osteophytic spurring. The conus  medullaris is normal and terminates at L1.     At L1-L2, there is diffuse disc bulging and endplate osteophytic  spurring. There is facet hypertrophy. There is no spinal canal stenosis.  There is mild to moderate right neural foraminal stenosis.     At L2-L3, there is diffuse disc bulging and endplate osteophytic  spurring. There is facet and ligamentum flavum hypertrophy. There is  moderate spinal canal stenosis. There is mild bilateral neural foraminal  stenosis.     At L3-L4, there is diffuse disc bulging and endplate osteophytic  spurring. There is facet and ligamentum flavum hypertrophy. There is  mild spinal canal stenosis. There is mild bilateral neural foraminal  stenosis.     At L4-L5, there is irregularity of the lamina, suggestive of prior  laminectomy. There is diffuse disc bulging and endplate osteophytic  spurring. There is facet hypertrophy. There is no spinal canal stenosis.  There is mild right and moderate left neural foraminal stenosis.     At L5-S1, there is irregularity of the left lamina which may reflect  previous laminectomy. There is disc bulging and endplate osteophytic  spurring. There is facet hypertrophy. There is no spinal canal stenosis.  There is mild right and moderate left neural foraminal stenosis.     Overall, degenerative findings are similar compared to July 28, 2020.     IMPRESSION:      1.  Postoperative and degenerative changes of the lumbar spine as  described, similar compared to July 28, 2020. At L2-L3, there is  moderate spinal canal stenosis due to disc bulging, endplate osteophytic  spurring, facet and ligamentum flavum hypertrophy. Milder spinal canal  stenosis is seen at L3-L4. There is multilevel  neural foraminal  stenosis.    Physical Exam   Visit Vitals  BP 160/65 (BP Site: Left arm)   Pulse 68   Temp 98 F (36.7 C) (Oral)   Ht 1.54 m (5' 0.63")   Wt 54.4 kg (119 lb 14.9 oz)   SpO2 99%   BMI 22.94 kg/m      GENERAL: Well developed, well  nourished, no acute distress.  Normal mood and affect.   ABDOMEN: Non-distended.   NEUROLOGIC: Sensory and Motor exam grossly intact; gait -- normal  SPINE/MUSCULOSKELETAL:   ROM lumbar spine: decreased   Spinal curvature: normal   No palpable trigger points.   SKIN: No evidence of swelling, edema, rashes, or infection on exposed areas of skin.   PSYCHIATRIC: The patient is alert and oriented.  Affect is appropriate.  she does not appear sedated or somnolent on exam.      Description of Procedure:      The risk benefits and alternatives were reviewed and the patient agreed to proceed.  I had the patient either sign a written informed consent for serial procedures over the next six months or I confirmed there was a valid prior signed informed consent for serial procedures after confirming there were no further questions or concerns.  The patient was then taken to the procedure room and positioned prone.  I prepped the back with ChloraPrep, allowed adequate time for the ChloraPrep to dry, and then draped the back in usual sterile fashion.  I wore a mask and sterile gloves for the procedure.  Using fluoroscopic guidance identified the right and left L4-5 neural foramen and anesthetized the skin and underlying tissues above with 3 cc's 1% lidocaine.  I then advanced a 3-1/2 inch 23-gauge Quincke spinal needle into the neural foramen.  There were no paresthesias needle placement.  Injection of 2 cc's Omnipaque 240 outlined epidural spread.  There is no vascular runoff.  I then injected a solution containing a mixture of  1cc 1% lidocaine mixed with 20mg  triamcinolone at each foramen.  I flushed the needle before removal and had a sterile bandage applied to the needle insertion  site.  Patient tolerated the procedures well and was taken to the recovery area in stable condition.     Fluoroscopy Data:   Refer to imaging section in Epic     Medications used for the procedure:  Lot numbers and amount used saved in Epic     Plan:      Home Exercise Program  Activity Modifications  Continue Pain Medications as currently prescribed  Follow up in 3-4 weeks to assess improvement in L4-5 injection  Follow up with Dr Deloria Lair to assess for possible surgical options      Glendell Docker, MD  [facility] Program Director for Pain Medicine  Assistant Professor, [employer] [facility]  Team Physician, Pain Medicine, [facility] Football Team  [facility]  [address]  [place], [place] [address]  T [phone]          This note was generated in part by the Epic EMR system/ Dragon speech recognition and may contain errors or omissions not intended by the user. Grammatical errors, random word insertions, deletions, pronoun errors and incomplete sentences are occasional consequences of this technology due to software limitations. Not all errors are caught or corrected. If there are questions or concerns about the content of this note or information contained within the body of this dictation they should be addressed directly with the author for clarification

## 2021-08-10 NOTE — Addendum Note (Signed)
Encounter addended by: Katha Hamming, RN on: 08/10/2021 4:41 PM   Actions taken: Flowsheet accepted, MAR administration accepted

## 2021-08-14 ENCOUNTER — Ambulatory Visit: Payer: Medicare PPO | Admitting: Physical Therapy

## 2021-08-14 ENCOUNTER — Other Ambulatory Visit: Payer: Self-pay

## 2021-08-14 ENCOUNTER — Encounter: Payer: Self-pay | Admitting: Physical Therapy

## 2021-08-14 DIAGNOSIS — M5416 Radiculopathy, lumbar region: Secondary | ICD-10-CM | POA: Diagnosis not present

## 2021-08-14 DIAGNOSIS — M21372 Foot drop, left foot: Secondary | ICD-10-CM

## 2021-08-14 DIAGNOSIS — R2681 Unsteadiness on feet: Secondary | ICD-10-CM

## 2021-08-14 DIAGNOSIS — M21371 Foot drop, right foot: Secondary | ICD-10-CM

## 2021-08-14 DIAGNOSIS — M6281 Muscle weakness (generalized): Secondary | ICD-10-CM

## 2021-08-14 DIAGNOSIS — R2689 Other abnormalities of gait and mobility: Secondary | ICD-10-CM

## 2021-08-14 NOTE — Therapy (Signed)
[facility] [address]  [address] [place], [place], [address] Phone: [phone]   Fax:  [phone]  Physical Therapy Treatment  Patient Details  Name: [name] Trujillo MRN: [id number] Date of Birth: 21-Feb-1941 Referring Provider (PT): Rondel Oh, NP for [name], MD   Encounter Date: 08/14/2021   PT End of Session - 08/14/21 1315     Visit Number 7    Number of Visits 13    Date for PT Re-Evaluation 08/30/21   time frame extended to 8 weeks secondary to holidays and limited scheduling availability   Authorization Type [employer]    Authorization Time Period 07/11/21 - 08/30/21    Authorization - Visit Number 6    Authorization - Number of Visits 12    PT Start Time 1315    PT Stop Time 1402    PT Time Calculation (min) 47 min    Activity Tolerance Patient tolerated treatment well    Behavior During Therapy [facility] for tasks assessed/performed             Past Medical History:  Diagnosis Date   High cholesterol    Hypothyroidism    Neuropathy     Past Surgical History:  Procedure Laterality Date   APPENDECTOMY     25-18 yo   BACK SURGERY     Age 16   CESAREAN SECTION     ECTOPIC PREGNANCY SURGERY     LAPAROSCOPIC HYSTERECTOMY      There were no vitals filed for this visit.   Subjective Assessment - 08/14/21 1323     Subjective Pt reports second ESI at L4-5 was uneventful but she has yet to notice any change thus far.    Pertinent History 09/21/20 - ACDF C5-6-7    Diagnostic tests 06/12/21 - Lumbar MRI: Postoperative and degenerative changes of the lumbar spine as described, similar compared to July 28, 2020. At L2-L3, there is moderate spinal canal stenosis due to disc bulging, endplate osteophytic spurring, facet and ligamentum flavum hypertrophy. Milder spinal canal stenosis is seen at L3-L4. There is multilevel neural foraminal stenosis.    Patient Stated Goals "to keep me walking"     Currently in Pain? No/denies                [facility] PT Assessment - 08/14/21 1315       Assessment   Medical Diagnosis Lumbar radiculopathy    Referring Provider (PT) Rondel Oh, NP for [name], MD    Onset Date/Surgical Date --   chronic   Next MD Visit 09/19/21                           [facility] Adult PT Treatment/Exercise - 08/14/21 1315       Exercises   Exercises Lumbar      Lumbar Exercises: Aerobic   Recumbent Bike L3 x 6 min      Lumbar Exercises: Standing   Forward Lunge 10 reps;3 seconds   2 sets - 1 each with R & L leg fwd   Forward Lunge Limitations cues to avoid knee fwd of toes; UE support on counter    Side Lunge 10 reps;2 seconds    Side Lunge Limitations cues for alignment of foot and hip; UE support on counter   pt reporting no issues during exercise but notes increased LE numbness upon completion   Other Standing Lumbar Exercises R/L red TB pallof  press 2 x 10      Lumbar Exercises: Seated   Long Arc Quad on [employer] Right;Left;10 reps;AROM;Strengthening    LAQ on [place] Limitations seated on Dynadisc with feet on Airex pad    Hip Flexion on Ball Right;Left;10 reps    Hip Flexion on Ball Limitations seated on Dynadisc with feet on Airex pad                     PT Education - 08/14/21 1400     Education Details HEP update (per pt request) - lumbopelvic strengthening progression in dynamic sitting and standing - Access Code: [email or web address]    Person(s) Educated Patient    Methods Explanation;Demonstration;Verbal cues;Handout    Comprehension Verbalized understanding;Verbal cues required;Returned demonstration;Need further instruction              PT Short Term Goals - 08/07/21 1323       PT SHORT TERM GOAL #1   Title Patient will be independent with initial HEP    Status Achieved   08/07/21   Target Date --      PT SHORT TERM GOAL #2   Title Patient will verbalize/demonstrate understanding of neutral spine posture  and proper body mechanics to reduce strain on lumbar spine    Status Achieved   08/07/21              PT Long Term Goals - 07/26/21 1400       PT LONG TERM GOAL #1   Title Patient will be independent with ongoing/advanced HEP for self-management at home    Status On-going    Target Date 08/30/21      PT LONG TERM GOAL #2   Title Patient will demonstrate improved B hip strength to >/= 4/5 and B ankle strength to >/= 3+/5 to 4-/5  for improved stability and ease of mobility    Status On-going    Target Date 08/30/21      PT LONG TERM GOAL #3   Title Patient will ambulate with improved gait pattern with decreased B foot drop/slap and no evidence of instability/LOB    Status On-going    Target Date 08/30/21      PT LONG TERM GOAL #4   Title Patient will report ability to resume prior level of walking for exercise w/o limitation due to LE radiculopathy    Status On-going    Target Date 08/30/21      PT LONG TERM GOAL #5   Title Patient will report improved ability to climb in/out of bath w/o feeling of instability    Status On-going    Target Date 08/30/21      PT LONG TERM GOAL #6   Title Patient will improve FGA score to >/= 19/30 to improve gait stability and reduce risk for falls    Baseline 15/30 on eval    Status On-going    Target Date 08/30/21                   Plan - 08/14/21 1402     Clinical Impression Statement [name] Trujillo reports no changes thus far from her most recent ESI at L4-5 last week. She continues to note fluctuations in her balance, sometimes/days not feeling like she needs her walking pole and other days/times feeling that she cannot walk w/o support. Continued focus on core and proximal LE strengthening reintroducing fwd lunges (previously done in a prior PT episode) and introducing pallof presses and lateral lunges -  all well tolerated but pt noting increased feeling of LE numbness following lateral lunges, therefore deferred from HEP.     Comorbidities C5-7 ACDF 09/21/20; idiopathic progressive neuropathy; hypthyroidism; DDD; remote h/o back surgery at age 9; h/o scoliosis    Rehab Potential Good    PT Frequency 2x / week    PT Duration 6 weeks    PT Treatment/Interventions ADLs/Self Care Home Management;Cryotherapy;Electrical Stimulation;Moist Heat;Ultrasound;DME Instruction;Gait training;Stair training;Functional mobility training;Therapeutic activities;Therapeutic exercise;Balance training;Neuromuscular re-education;Patient/family education;Manual techniques;Passive range of motion;Dry needling;Energy conservation;Taping;Vestibular;Spinal Manipulations;Joint Manipulations    PT Next Visit Plan progress lumbopelvic flexibility and strengthening incorporating pelvic floor as indicated; review/update HEP PRN; posture/body mechanics review as indicated    PT Home Exercise Plan Access Code: [email or web address] (11/2, updated 11/8 & 11/29)    Consulted and Agree with Plan of Care Patient             Patient will benefit from skilled therapeutic intervention in order to improve the following deficits and impairments:  Abnormal gait, Decreased activity tolerance, Decreased balance, Decreased coordination, Decreased endurance, Decreased knowledge of use of DME, Decreased mobility, Decreased range of motion, Decreased safety awareness, Decreased strength, Difficulty walking, Increased fascial restricitons, Increased muscle spasms, Impaired perceived functional ability, Impaired flexibility, Impaired sensation, Dizziness, Impaired tone, Improper body mechanics, Postural dysfunction  Visit Diagnosis: Radiculopathy, lumbar region  Muscle weakness (generalized)  Foot drop, left  Foot drop, right  Other abnormalities of gait and mobility  Unsteadiness on feet     Problem List Patient Active Problem List   Diagnosis Date Noted   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   Ventricular premature beats 10/31/2015   Heart murmur  10/31/2015    Marry Guan, PT 08/14/2021, 4:35 PM  [facility] [address]  [address] [place], [place], [address] Phone: [phone]   Fax:  [phone]  Name: [name] Trujillo MRN: [id number] Date of Birth: Aug 13, 1941

## 2021-08-14 NOTE — Patient Instructions (Signed)
    Access Code: [email or web address] URL: [email or web address] Date: 08/14/2021 Prepared by: Glenetta Hew  Exercises Seated Hamstring Stretch with Strap - 2 x daily - 7 x weekly - 3 reps - 30 sec hold Standing ITB Stretch - 2 x daily - 7 x weekly - 3 reps - 30 sec hold Supine Quadriceps Stretch with Strap on Table - 2 x daily - 7 x weekly - 3 reps - 30 sec hold Supine Figure 4 Piriformis Stretch - 2 x daily - 7 x weekly - 3 reps - 30 sec hold Supine Piriformis Stretch with Foot on Ground - 2 x daily - 7 x weekly - 3 reps - 30 sec hold Supine Piriformis Stretch Pulling Heel to Hip - 2 x daily - 7 x weekly - 3 reps - 30 sec hold Gastroc Stretch on Wall - 2 x daily - 7 x weekly - 3 reps - 30 sec hold Soleus Stretch on Wall - 2 x daily - 7 x weekly - 3 reps - 30 sec hold Supine Transversus Abdominis Bracing - Hands on Stomach - 2 x daily - 7 x weekly - 2 sets - 10 reps - 5 sec hold Seated Transversus Abdominis Bracing - 2 x daily - 7 x weekly - 2 sets - 10 reps - 3 sec hold Hooklying Isometric Clamshell - 1 x daily - 7 x weekly - 2 sets - 10 reps - 3 sec hold Supine March with Resistance Band - 1 x daily - 7 x weekly - 2 sets - 10 reps - 2-3 sec hold hold Supine Hip Adduction Isometric with Ball - 1 x daily - 7 x weekly - 2 sets - 10 reps - 5 sec hold Abdominal Press into [place] - 1 x daily - 7 x weekly - 2 sets - 10 reps - 3 sec hold Hooklying Isometric Hip Flexion - 1 x daily - 5 x weekly - 2 sets - 10 reps - 5 sec hold Hooklying Isometric Hip Flexion with Opposite Arm - 1 x daily - 5 x weekly - 2 sets - 10 reps - 5 sec hold Supine Dead Bug with Leg Extension - 1 x daily - 5 x weekly - 2 sets - 10 reps - 3 sec hold Hooklying Sequential Leg March and Lower - 1 x daily - 5 x weekly - 2 sets - 10 reps - 3 sec hold Standing Anti-Rotation Press with Anchored Resistance - 1 x daily - 7 x weekly - 2 sets - 10 reps - 3 sec hold Lunge with Counter Support - 1 x daily - 7 x weekly - 2 sets - 10 reps  - 3 sec hold Swiss Ball March - 1 x daily - 7 x weekly - 2 sets - 10 reps - 3 sec hold Swiss Ball Knee Extension - 1 x daily - 7 x weekly - 2 sets - 10 reps - 3 sec hold Shoulder Horizontal Abduction with Resistance on Swiss Ball - 1 x daily - 7 x weekly - 2 sets - 10 reps - 3 sec hold Seated Upper Extremity Diagonals with Resistance on Swiss Ball - 1 x daily - 7 x weekly - 2 sets - 10 reps - 3 sec hold  Patient Education Hospital doctor

## 2021-08-16 ENCOUNTER — Encounter: Payer: Self-pay | Admitting: Physical Therapy

## 2021-08-16 ENCOUNTER — Ambulatory Visit: Payer: Medicare PPO | Admitting: Physical Therapy

## 2021-08-16 ENCOUNTER — Other Ambulatory Visit: Payer: Self-pay

## 2021-08-16 DIAGNOSIS — M21371 Foot drop, right foot: Secondary | ICD-10-CM

## 2021-08-16 DIAGNOSIS — M6281 Muscle weakness (generalized): Secondary | ICD-10-CM

## 2021-08-16 DIAGNOSIS — M5416 Radiculopathy, lumbar region: Secondary | ICD-10-CM

## 2021-08-16 DIAGNOSIS — R2689 Other abnormalities of gait and mobility: Secondary | ICD-10-CM

## 2021-08-16 DIAGNOSIS — R2681 Unsteadiness on feet: Secondary | ICD-10-CM

## 2021-08-16 DIAGNOSIS — M21372 Foot drop, left foot: Secondary | ICD-10-CM

## 2021-08-16 NOTE — Therapy (Signed)
[facility] [address]  [address] [place], [place], [address] Phone: [phone]   Fax:  [phone]  Physical Therapy Treatment  Patient Details  Name: [name] Trujillo MRN: [id number] Date of Birth: Jul 05, 1941 Referring Provider (PT): Rondel Oh, NP for [name] Beath, MD   Encounter Date: 08/16/2021   PT End of Session - 08/16/21 1316     Visit Number 8    Number of Visits 13    Date for PT Re-Evaluation 08/30/21   time frame extended to 8 weeks secondary to holidays and limited scheduling availability   Authorization Type [employer]    Authorization Time Period 07/11/21 - 08/30/21    Authorization - Visit Number 7    Authorization - Number of Visits 12    PT Start Time 1316    PT Stop Time 1348    PT Time Calculation (min) 32 min    Activity Tolerance Patient tolerated treatment well    Behavior During Therapy [facility] for tasks assessed/performed             Past Medical History:  Diagnosis Date   High cholesterol    Hypothyroidism    Neuropathy     Past Surgical History:  Procedure Laterality Date   APPENDECTOMY     60-18 yo   BACK SURGERY     Age 68   CESAREAN SECTION     ECTOPIC PREGNANCY SURGERY     LAPAROSCOPIC HYSTERECTOMY      There were no vitals filed for this visit.   Subjective Assessment - 08/16/21 1319     Subjective Pt reports increased sensation of heaviness and tightness (dead feeling) in a "boot distribution" on the R LE as well some tingling in her L LE and brief intermittent tingling in her L toes.    Pertinent History 09/21/20 - ACDF C5-6-7    Diagnostic tests 06/12/21 - Lumbar MRI: Postoperative and degenerative changes of the lumbar spine as described, similar compared to July 28, 2020. At L2-L3, there is moderate spinal canal stenosis due to disc bulging, endplate osteophytic spurring, facet and ligamentum flavum hypertrophy. Milder spinal canal stenosis is seen at L3-L4.  There is multilevel neural foraminal stenosis.    Patient Stated Goals "to keep me walking"    Currently in Pain? No/denies                [facility] PT Assessment - 08/16/21 0001       Strength   Right Hip Flexion 4-/5    Right Hip Extension 4+/5    Right Hip External Rotation  3+/5    Right Hip Internal Rotation 4-/5    Right Hip ABduction 4+/5    Right Hip ADduction 4/5    Left Hip Flexion 4/5    Left Hip Extension 4+/5    Left Hip External Rotation 4-/5    Left Hip Internal Rotation 4/5    Left Hip ABduction 4+/5    Left Hip ADduction 4/5    Right Knee Flexion 5/5    Right Knee Extension 5/5    Left Knee Flexion 5/5    Left Knee Extension 5/5    Right Ankle Dorsiflexion 3+/5    Right Ankle Plantar Flexion 3/5   unable to initiate single leg heel raise; 4/5 with manual resistance   Right Ankle Inversion 4-/5    Right Ankle Eversion 4-/5    Left Ankle Dorsiflexion 3-/5    Left Ankle Plantar Flexion 3/5  unable to initiate single leg heel raise; 4-/5 to 4/5 with manual resistance   Left Ankle Inversion 2-/5    Left Ankle Eversion 4-/5                                      PT Short Term Goals - 08/07/21 1323       PT SHORT TERM GOAL #1   Title Patient will be independent with initial HEP    Status Achieved   08/07/21   Target Date --      PT SHORT TERM GOAL #2   Title Patient will verbalize/demonstrate understanding of neutral spine posture and proper body mechanics to reduce strain on lumbar spine    Status Achieved   08/07/21              PT Long Term Goals - 07/26/21 1400       PT LONG TERM GOAL #1   Title Patient will be independent with ongoing/advanced HEP for self-management at home    Status On-going    Target Date 08/30/21      PT LONG TERM GOAL #2   Title Patient will demonstrate improved B hip strength to >/= 4/5 and B ankle strength to >/= 3+/5 to 4-/5  for improved stability and ease of mobility    Status  On-going    Target Date 08/30/21      PT LONG TERM GOAL #3   Title Patient will ambulate with improved gait pattern with decreased B foot drop/slap and no evidence of instability/LOB    Status On-going    Target Date 08/30/21      PT LONG TERM GOAL #4   Title Patient will report ability to resume prior level of walking for exercise w/o limitation due to LE radiculopathy    Status On-going    Target Date 08/30/21      PT LONG TERM GOAL #5   Title Patient will report improved ability to climb in/out of bath w/o feeling of instability    Status On-going    Target Date 08/30/21      PT LONG TERM GOAL #6   Title Patient will improve FGA score to >/= 19/30 to improve gait stability and reduce risk for falls    Baseline 15/30 on eval    Status On-going    Target Date 08/30/21                   Plan - 08/16/21 1348     Clinical Impression Statement [name] Trujillo now 6 days post-procedure from her latest ESI at L4-5 (the window where the MD expects to see the most response). She reports some sensory changes since the most recent ESI, noting feeling as if her legs are waking up from being asleep for a long time but with feeling of wearing a tight, heavy boot on the R and some tingling on the L. She also notes improved ability to recognize when she steps on something in bare feet which is something she has not been able to do for a few years. Motor control for AROM of L DF also seems mildly improved but still unable to initiate AROM into L ankle inversion or generate any force against MMT resistance for both motions. Otherwise, strength changes are variable with a few MMT grades improved in hips ankles while others unchanged or decreased (B knee strength remains 5/5). Functionally  she has noted improvement with transitional movements, in particular increased ease of floor to stand transfers, and overall feels her balance has been better with decreased reliance on her walking pole at times but  still has periods where she does not feel that she could walk w/o external support. She continues to note issues with bowel and bladder control but feels like it is not as bad as it had been previously. Upon completion of above discussion and assessment, Breezie requesting to defer any further PT today due c/o not feeling well.    Comorbidities C5-7 ACDF 09/21/20; idiopathic progressive neuropathy; hypthyroidism; DDD; remote h/o back surgery at age 71; h/o scoliosis    Rehab Potential Good    PT Frequency 2x / week    PT Duration 6 weeks    PT Treatment/Interventions ADLs/Self Care Home Management;Cryotherapy;Electrical Stimulation;Moist Heat;Ultrasound;DME Instruction;Gait training;Stair training;Functional mobility training;Therapeutic activities;Therapeutic exercise;Balance training;Neuromuscular re-education;Patient/family education;Manual techniques;Passive range of motion;Dry needling;Energy conservation;Taping;Vestibular;Spinal Manipulations;Joint Manipulations    PT Next Visit Plan progress lumbopelvic flexibility and strengthening incorporating pelvic floor as indicated; review/update HEP PRN; posture/body mechanics review as indicated    PT Home Exercise Plan Access Code: [email or web address] (11/2, updated 11/8 & 11/29)    Consulted and Agree with Plan of Care Patient             Patient will benefit from skilled therapeutic intervention in order to improve the following deficits and impairments:  Abnormal gait, Decreased activity tolerance, Decreased balance, Decreased coordination, Decreased endurance, Decreased knowledge of use of DME, Decreased mobility, Decreased range of motion, Decreased safety awareness, Decreased strength, Difficulty walking, Increased fascial restricitons, Increased muscle spasms, Impaired perceived functional ability, Impaired flexibility, Impaired sensation, Dizziness, Impaired tone, Improper body mechanics, Postural dysfunction  Visit Diagnosis: Radiculopathy, lumbar  region  Muscle weakness (generalized)  Foot drop, left  Foot drop, right  Other abnormalities of gait and mobility  Unsteadiness on feet     Problem List Patient Active Problem List   Diagnosis Date Noted   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   Ventricular premature beats 10/31/2015   Heart murmur 10/31/2015    Marry Guan, PT 08/16/2021, 7:21 PM  [facility] [address]  [address] [place], [place], [address] Phone: [phone]   Fax:  [phone]  Name: [name] Trujillo MRN: [id number] Date of Birth: 07/27/1941

## 2021-08-21 ENCOUNTER — Ambulatory Visit: Payer: Medicare PPO | Admitting: Physical Therapy

## 2021-08-21 ENCOUNTER — Other Ambulatory Visit: Payer: Self-pay

## 2021-08-21 ENCOUNTER — Encounter: Payer: Self-pay | Admitting: Physical Therapy

## 2021-08-21 DIAGNOSIS — R2689 Other abnormalities of gait and mobility: Secondary | ICD-10-CM

## 2021-08-21 DIAGNOSIS — M5416 Radiculopathy, lumbar region: Secondary | ICD-10-CM

## 2021-08-21 DIAGNOSIS — M21371 Foot drop, right foot: Secondary | ICD-10-CM

## 2021-08-21 DIAGNOSIS — M21372 Foot drop, left foot: Secondary | ICD-10-CM

## 2021-08-21 DIAGNOSIS — M6281 Muscle weakness (generalized): Secondary | ICD-10-CM

## 2021-08-21 DIAGNOSIS — R2681 Unsteadiness on feet: Secondary | ICD-10-CM

## 2021-08-21 NOTE — Therapy (Signed)
[facility] [address]  [address] [place], [place], [address] Phone: [phone]   Fax:  [phone]  Physical Therapy Treatment  Patient Details  Name: [name] Trujillo MRN: [id number] Date of Birth: 1940-12-05 Referring Provider (PT): Rondel Oh, NP for [name], MD   Encounter Date: 08/21/2021   PT End of Session - 08/21/21 1315     Visit Number 9    Number of Visits 13    Date for PT Re-Evaluation 08/30/21   time frame extended to 8 weeks secondary to holidays and limited scheduling availability   Authorization Type [employer]    Authorization Time Period 07/11/21 - 08/30/21    Authorization - Visit Number 8    Authorization - Number of Visits 12    PT Start Time 1315    PT Stop Time 1359    PT Time Calculation (min) 44 min    Activity Tolerance Patient tolerated treatment well    Behavior During Therapy [facility] for tasks assessed/performed             Past Medical History:  Diagnosis Date   High cholesterol    Hypothyroidism    Neuropathy     Past Surgical History:  Procedure Laterality Date   APPENDECTOMY     78-20 yo   BACK SURGERY     Age 80   CESAREAN SECTION     ECTOPIC PREGNANCY SURGERY     LAPAROSCOPIC HYSTERECTOMY      There were no vitals filed for this visit.   Subjective Assessment - 08/21/21 1320     Subjective Pt reports worsening of her LE numbness in her R LE but no longer has the tingling in her upper thigh. Also noting variable motor control in her legs - able to pick up marbles with her toes which she hasn't been able to do in a long while but then later unable to do so. She feels less secure with her balance as well.    Pertinent History 09/21/20 - ACDF C5-6-7    Diagnostic tests 06/12/21 - Lumbar MRI: Postoperative and degenerative changes of the lumbar spine as described, similar compared to July 28, 2020. At L2-L3, there is moderate spinal canal stenosis due to disc  bulging, endplate osteophytic spurring, facet and ligamentum flavum hypertrophy. Milder spinal canal stenosis is seen at L3-L4. There is multilevel neural foraminal stenosis.    Patient Stated Goals "to keep me walking"    Currently in Pain? No/denies                               [facility] Adult PT Treatment/Exercise - 08/21/21 1315       Lumbar Exercises: Aerobic   UBE (Upper Arm Bike) L3.0 x 6 min (3' fwd/3' back)      Lumbar Exercises: Supine   Bent Knee Raise 5 reps;5 seconds    Bent Knee Raise Limitations double knee    Dead Bug 10 reps;2 seconds;5 reps    Dead Bug Limitations LE only from 90/90 x 10; alt UE/LE from UE & LE at 90/90 x 5      Lumbar Exercises: Quadruped   Madcat/Old Horse 10 reps    Straight Leg Raise 10 reps;3 seconds      Ankle Exercises: Seated   Marble Pickup R x 8, L x 2  PT Short Term Goals - 08/07/21 1323       PT SHORT TERM GOAL #1   Title Patient will be independent with initial HEP    Status Achieved   08/07/21   Target Date --      PT SHORT TERM GOAL #2   Title Patient will verbalize/demonstrate understanding of neutral spine posture and proper body mechanics to reduce strain on lumbar spine    Status Achieved   08/07/21              PT Long Term Goals - 07/26/21 1400       PT LONG TERM GOAL #1   Title Patient will be independent with ongoing/advanced HEP for self-management at home    Status On-going    Target Date 08/30/21      PT LONG TERM GOAL #2   Title Patient will demonstrate improved B hip strength to >/= 4/5 and B ankle strength to >/= 3+/5 to 4-/5  for improved stability and ease of mobility    Status On-going    Target Date 08/30/21      PT LONG TERM GOAL #3   Title Patient will ambulate with improved gait pattern with decreased B foot drop/slap and no evidence of instability/LOB    Status On-going    Target Date 08/30/21      PT LONG TERM GOAL #4   Title  Patient will report ability to resume prior level of walking for exercise w/o limitation due to LE radiculopathy    Status On-going    Target Date 08/30/21      PT LONG TERM GOAL #5   Title Patient will report improved ability to climb in/out of bath w/o feeling of instability    Status On-going    Target Date 08/30/21      PT LONG TERM GOAL #6   Title Patient will improve FGA score to >/= 19/30 to improve gait stability and reduce risk for falls    Baseline 15/30 on eval    Status On-going    Target Date 08/30/21                   Plan - 08/21/21 1359     Clinical Impression Statement [name] Trujillo reports increased intensity of the numbness in her R LE but improved hot/cold recognition and variable motor control - she is unsure how much of this is related to the ESI. She notes an increased feeling of insecurity with standing and gait with increased hesitancy to let go of her walking stick even to remove her coat. Todays session focusing on progression of lumbopelvic strengthening with some initial difficulty coordinating movement patterns but otherwise well-tolerated.    Comorbidities C5-7 ACDF 09/21/20; idiopathic progressive neuropathy; hypthyroidism; DDD; remote h/o back surgery at age 46; h/o scoliosis    Rehab Potential Good    PT Frequency 2x / week    PT Duration 6 weeks    PT Treatment/Interventions ADLs/Self Care Home Management;Cryotherapy;Electrical Stimulation;Moist Heat;Ultrasound;DME Instruction;Gait training;Stair training;Functional mobility training;Therapeutic activities;Therapeutic exercise;Balance training;Neuromuscular re-education;Patient/family education;Manual techniques;Passive range of motion;Dry needling;Energy conservation;Taping;Vestibular;Spinal Manipulations;Joint Manipulations    PT Next Visit Plan progress lumbopelvic flexibility and strengthening incorporating pelvic floor as indicated; review/update HEP PRN; posture/body mechanics review as indicated     PT Home Exercise Plan Access Code: [email or web address] (11/2, updated 11/8 & 11/29)    Consulted and Agree with Plan of Care Patient             Patient will benefit  from skilled therapeutic intervention in order to improve the following deficits and impairments:  Abnormal gait, Decreased activity tolerance, Decreased balance, Decreased coordination, Decreased endurance, Decreased knowledge of use of DME, Decreased mobility, Decreased range of motion, Decreased safety awareness, Decreased strength, Difficulty walking, Increased fascial restricitons, Increased muscle spasms, Impaired perceived functional ability, Impaired flexibility, Impaired sensation, Dizziness, Impaired tone, Improper body mechanics, Postural dysfunction  Visit Diagnosis: Radiculopathy, lumbar region  Muscle weakness (generalized)  Foot drop, left  Foot drop, right  Other abnormalities of gait and mobility  Unsteadiness on feet     Problem List Patient Active Problem List   Diagnosis Date Noted   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   Ventricular premature beats 10/31/2015   Heart murmur 10/31/2015    Marry Guan, PT 08/21/2021, 6:21 PM  [facility] [address]  [address] [place], [place], [address] Phone: [phone]   Fax:  [phone]  Name: [name] Trujillo MRN: [id number] Date of Birth: 1941-01-27

## 2021-08-23 ENCOUNTER — Encounter: Payer: Self-pay | Admitting: Physical Therapy

## 2021-08-23 ENCOUNTER — Ambulatory Visit: Payer: Medicare PPO | Admitting: Physical Therapy

## 2021-08-23 ENCOUNTER — Other Ambulatory Visit: Payer: Self-pay

## 2021-08-23 DIAGNOSIS — M5416 Radiculopathy, lumbar region: Secondary | ICD-10-CM

## 2021-08-23 DIAGNOSIS — M6281 Muscle weakness (generalized): Secondary | ICD-10-CM

## 2021-08-23 DIAGNOSIS — R2681 Unsteadiness on feet: Secondary | ICD-10-CM

## 2021-08-23 DIAGNOSIS — M21372 Foot drop, left foot: Secondary | ICD-10-CM

## 2021-08-23 DIAGNOSIS — R2689 Other abnormalities of gait and mobility: Secondary | ICD-10-CM

## 2021-08-23 DIAGNOSIS — M21371 Foot drop, right foot: Secondary | ICD-10-CM

## 2021-08-23 NOTE — Therapy (Signed)
[facility] [address]  [place], [place], [address] Phone: [phone]   Fax:  [phone]  Physical Therapy Treatment / Progress Note  Patient Details  Name: [name] Trujillo MRN: [id number] Date of Birth: 01-15-1941 Referring Provider (PT): Dorena Dew, NP for [name], MD  Progress Note  Reporting Period 07/05/2021 to 08/23/2021  See note below for Objective Data and Assessment of Progress/Goals.     Encounter Date: 08/23/2021   PT End of Session - 08/23/21 1312     Visit Number 10    Number of Visits 13    Date for PT Re-Evaluation 08/30/21   time frame extended to 8 weeks secondary to holidays and limited scheduling availability   Authorization Type [employer]    Authorization Time Period 07/11/21 - 08/30/21    Authorization - Visit Number 9    Authorization - Number of Visits 12    PT Start Time 1312    PT Stop Time 1354    PT Time Calculation (min) 42 min    Activity Tolerance Patient tolerated treatment well    Behavior During Therapy WFL for tasks assessed/performed             Past Medical History:  Diagnosis Date   High cholesterol    Hypothyroidism    Neuropathy     Past Surgical History:  Procedure Laterality Date   APPENDECTOMY     78-20 yo   BACK SURGERY     Age 80   CESAREAN SECTION     ECTOPIC PREGNANCY SURGERY     LAPAROSCOPIC HYSTERECTOMY      There were no vitals filed for this visit.   Subjective Assessment - 08/23/21 1320     Subjective Pt states my legs feel like Forrest Gump - "you never know what your are going to get". She notes some increased muscle soreness following last visit. Also notes she has some leg cramps again the night she was here last.    Pertinent History 09/21/20 - ACDF C5-6-7    Diagnostic tests 06/12/21 - Lumbar MRI: Postoperative and degenerative changes of the lumbar spine as described, similar compared to July 28, 2020. At  L2-L3, there is moderate spinal canal stenosis due to disc bulging, endplate osteophytic spurring, facet and ligamentum flavum hypertrophy. Milder spinal canal stenosis is seen at L3-L4. There is multilevel neural foraminal stenosis.    Patient Stated Goals "to keep me walking"                [facility] PT Assessment - 08/23/21 1312       Assessment   Medical Diagnosis Lumbar radiculopathy    Referring Provider (PT) Dorena Dew, NP for [name], MD    Next MD Visit 09/19/21      Ambulation/Gait   Gait velocity 2.68 ft/sec w/o AD; 3.03 ft/sec with hiking pole      Standardized Balance Assessment   10 Meter Walk 12.22 sec w/o AD; 10.81 sec with hiking pole      Functional Gait  Assessment   Gait Level Surface Walks 20 ft in less than 7 sec but greater than 5.5 sec, uses assistive device, slower speed, mild gait deviations, or deviates 6-10 in outside of the 12 in walkway width.    Change in Gait Speed Able to smoothly change walking speed without loss of balance or gait deviation. Deviate no more than 6 in outside of the 12 in walkway width.  Gait with Horizontal Head Turns Performs head turns smoothly with no change in gait. Deviates no more than 6 in outside 12 in walkway width    Gait with Vertical Head Turns Performs head turns with no change in gait. Deviates no more than 6 in outside 12 in walkway width.    Gait and Pivot Turn Pivot turns safely within 3 sec and stops quickly with no loss of balance.    Step Over Obstacle Is able to step over 2 stacked shoe boxes taped together (9 in total height) without changing gait speed. No evidence of imbalance.    Gait with Narrow Base of Support Ambulates less than 4 steps heel to toe or cannot perform without assistance.    Gait with Eyes Closed Cannot walk 20 ft without assistance, severe gait deviations or imbalance, deviates greater than 15 in outside 12 in walkway width or will not attempt task.    Ambulating Backwards Walks 20  ft, no assistive devices, good speed, no evidence for imbalance, normal gait    Steps Alternating feet, must use rail.    Total Score 22    FGA comment: 19-24 = medium risk fall                           [facility] Adult PT Treatment/Exercise - 08-29-21 1312       Lumbar Exercises: Aerobic   UBE (Upper Arm Bike) L4.0 x 6 min (3' fwd/3' back)      Lumbar Exercises: Supine   Bent Knee Raise 5 reps;5 seconds    Bent Knee Raise Limitations double knee    Dead Bug 10 reps;2 seconds;5 reps    Dead Bug Limitations LE only from 90/90 x 10; alt UE/LE from UE & LE at 90/90 x 5                       PT Short Term Goals - 08/07/21 1323       PT SHORT TERM GOAL #1   Title Patient will be independent with initial HEP    Status Achieved   08/07/21   Target Date --      PT SHORT TERM GOAL #2   Title Patient will verbalize/demonstrate understanding of neutral spine posture and proper body mechanics to reduce strain on lumbar spine    Status Achieved   08/07/21              PT Long Term Goals - 08-29-21 1321       PT LONG TERM GOAL #1   Title Patient will be independent with ongoing/advanced HEP for self-management at home    Status Partially Met   2021-08-29 - met for current HEP   Target Date 08/30/21      PT LONG TERM GOAL #2   Title Patient will demonstrate improved B hip strength to >/= 4/5 and B ankle strength to >/= 3+/5 to 4-/5  for improved stability and ease of mobility    Status On-going    Target Date 08/30/21      PT LONG TERM GOAL #3   Title Patient will ambulate with improved gait pattern with decreased B foot drop/slap and no evidence of instability/LOB    Status Partially Met   29-Aug-2021 - foot slap less pronounced/frequent on R vs L   Target Date 08/30/21      PT LONG TERM GOAL #4   Title Patient will report ability  to resume prior level of walking for exercise w/o limitation due to LE radiculopathy    Status Partially Met   08/23/21 -  Pt reports she is "just about" at her prior level of walking in the neighborhood   Target Date 08/30/21      PT LONG TERM GOAL #5   Title Patient will report improved ability to climb in/out of bath w/o feeling of instability    Status Partially Met   08/23/21 - pt reports she does not feel as wobbly as she did earlier this year but still does not feel steady   Target Date 08/30/21      PT LONG TERM GOAL #6   Title Patient will improve FGA score to >/= 19/30 to improve gait stability and reduce risk for falls    Baseline 15/30 on eval    Status Achieved   08/23/21   Target Date 08/30/21                   Plan - 08/23/21 1354     Clinical Impression Statement Whittney reports her legs feel very unpredictable since the ESI injections - some days she feels stronger and steadier where she can walk w/o external support and other days more dependent on her walking pole. She continues to report more dense feeling of the numbness but feels that her motor control is somewhat improved but again variable from day to day and sometimes hour to hour. She has demonstrated improvement on her standardized tests with gait speed increased to 2.68 ft/sec w/o AD and 3.03 ft/sec with hiking pole, and the FGA improved from 15/30 to 22/30 (LTG #6 met). She continues to demonstrate L>R foot slap with gait, especially when multitasking during gait. She has demonstrated progress with all of her PT goals, and we anticipate transitioning her to her HEP next week while she awaits her f/u with the surgeon on 09/19/21.    Comorbidities C5-7 ACDF 09/21/20; idiopathic progressive neuropathy; hypthyroidism; DDD; remote h/o back surgery at age 77; h/o scoliosis    Rehab Potential Good    PT Frequency 2x / week    PT Duration 6 weeks    PT Treatment/Interventions ADLs/Self Care Home Management;Cryotherapy;Electrical Stimulation;Moist Heat;Ultrasound;DME Instruction;Gait training;Stair training;Functional mobility  training;Therapeutic activities;Therapeutic exercise;Balance training;Neuromuscular re-education;Patient/family education;Manual techniques;Passive range of motion;Dry needling;Energy conservation;Taping;Vestibular;Spinal Manipulations;Joint Manipulations    PT Next Visit Plan Complete remainder of goal assessment and review/update HEP PRN; anticipate discharge with transition to HEP    PT Home Exercise Plan Access Code: [email or web address] (11/2, updated 11/8 & 11/29)    Consulted and Agree with Plan of Care Patient             Patient will benefit from skilled therapeutic intervention in order to improve the following deficits and impairments:  Abnormal gait, Decreased activity tolerance, Decreased balance, Decreased coordination, Decreased endurance, Decreased knowledge of use of DME, Decreased mobility, Decreased range of motion, Decreased safety awareness, Decreased strength, Difficulty walking, Increased fascial restricitons, Increased muscle spasms, Impaired perceived functional ability, Impaired flexibility, Impaired sensation, Dizziness, Impaired tone, Improper body mechanics, Postural dysfunction  Visit Diagnosis: Radiculopathy, lumbar region  Muscle weakness (generalized)  Foot drop, left  Foot drop, right  Other abnormalities of gait and mobility  Unsteadiness on feet     Problem List Patient Active Problem List   Diagnosis Date Noted   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   Ventricular premature beats 10/31/2015   Heart murmur 10/31/2015    Percival Spanish, PT 08/23/2021,  4:11 PM  [facility] [address]  [place], [place], [address] Phone: [phone]   Fax:  [phone]  Name: Yuliya Nova MRN: [id number] Date of Birth: 1941-04-05

## 2021-08-28 ENCOUNTER — Ambulatory Visit: Payer: Medicare PPO | Admitting: Physical Therapy

## 2021-08-28 ENCOUNTER — Other Ambulatory Visit: Payer: Self-pay

## 2021-08-28 DIAGNOSIS — M5416 Radiculopathy, lumbar region: Secondary | ICD-10-CM

## 2021-08-28 DIAGNOSIS — M21372 Foot drop, left foot: Secondary | ICD-10-CM

## 2021-08-28 DIAGNOSIS — R2681 Unsteadiness on feet: Secondary | ICD-10-CM

## 2021-08-28 DIAGNOSIS — R2689 Other abnormalities of gait and mobility: Secondary | ICD-10-CM

## 2021-08-28 DIAGNOSIS — M6281 Muscle weakness (generalized): Secondary | ICD-10-CM

## 2021-08-28 DIAGNOSIS — M21371 Foot drop, right foot: Secondary | ICD-10-CM

## 2021-08-28 NOTE — Patient Instructions (Signed)
°  Access Code: [email or web address] URL: [email or web address] Date: 08/28/2021 Prepared by: Glenetta Hew  Exercises Seated Hamstring Stretch with Strap - 2 x daily - 7 x weekly - 3 reps - 30 sec hold Standing ITB Stretch - 2 x daily - 7 x weekly - 3 reps - 30 sec hold Supine Quadriceps Stretch with Strap on Table - 2 x daily - 7 x weekly - 3 reps - 30 sec hold Supine Figure 4 Piriformis Stretch - 2 x daily - 7 x weekly - 3 reps - 30 sec hold Supine Piriformis Stretch with Foot on Ground - 2 x daily - 7 x weekly - 3 reps - 30 sec hold Supine Piriformis Stretch Pulling Heel to Hip - 2 x daily - 7 x weekly - 3 reps - 30 sec hold Gastroc Stretch on Wall - 2 x daily - 7 x weekly - 3 reps - 30 sec hold Soleus Stretch on Wall - 2 x daily - 7 x weekly - 3 reps - 30 sec hold Supine Transversus Abdominis Bracing - Hands on Stomach - 2 x daily - 7 x weekly - 2 sets - 10 reps - 5 sec hold Seated Transversus Abdominis Bracing - 2 x daily - 7 x weekly - 2 sets - 10 reps - 3 sec hold Hooklying Isometric Clamshell - 1 x daily - 7 x weekly - 2 sets - 10 reps - 3 sec hold Supine March with Resistance Band - 1 x daily - 7 x weekly - 2 sets - 10 reps - 2-3 sec hold hold Supine Hip Adduction Isometric with Ball - 1 x daily - 7 x weekly - 2 sets - 10 reps - 5 sec hold Abdominal Press into [place] - 1 x daily - 7 x weekly - 2 sets - 10 reps - 3 sec hold Hooklying Isometric Hip Flexion - 1 x daily - 5 x weekly - 2 sets - 10 reps - 5 sec hold Hooklying Isometric Hip Flexion with Opposite Arm - 1 x daily - 5 x weekly - 2 sets - 10 reps - 5 sec hold Supine Dead Bug with Leg Extension - 1 x daily - 5 x weekly - 2 sets - 10 reps - 3 sec hold Hooklying Sequential Leg March and Lower - 1 x daily - 5 x weekly - 2 sets - 10 reps - 3 sec hold Standing Anti-Rotation Press with Anchored Resistance - 1 x daily - 7 x weekly - 2 sets - 10 reps - 3 sec hold Lunge with Counter Support - 1 x daily - 7 x weekly - 2 sets - 10 reps - 3  sec hold Swiss Ball March - 1 x daily - 7 x weekly - 2 sets - 10 reps - 3 sec hold Swiss Ball Knee Extension - 1 x daily - 7 x weekly - 2 sets - 10 reps - 3 sec hold Shoulder Horizontal Abduction with Resistance on Swiss Ball - 1 x daily - 7 x weekly - 2 sets - 10 reps - 3 sec hold Seated Upper Extremity Diagonals with Resistance on Swiss Ball - 1 x daily - 7 x weekly - 2 sets - 10 reps - 3 sec hold Seated Hip Flexor Stretch - 2-3 x daily - 7 x weekly - 3 reps - 30 sec hold  Patient Education Hospital doctor

## 2021-08-28 NOTE — Therapy (Addendum)
[facility] [address]  [place], [place], [address] Phone: [phone]   Fax:  [phone]  Physical Therapy Treatment / Discharge Summary   Patient Details  Name: [name] Trujillo MRN: [id number] Date of Birth: [birth year] Referring Provider (PT): Dorena Dew, NP for [name], MD   Encounter Date: 08/28/2021   PT End of Session - 08/28/21 1317     Visit Number 11    Number of Visits 13    Date for PT Re-Evaluation 08/30/21   time frame extended to 8 weeks secondary to holidays and limited scheduling availability   Authorization Type [employer]    Authorization Time Period 07/11/21 - 08/30/21    Authorization - Visit Number 10    Authorization - Number of Visits 12    PT Start Time 0947    PT Stop Time 1358    PT Time Calculation (min) 41 min    Activity Tolerance Patient tolerated treatment well    Behavior During Therapy [facility] for tasks assessed/performed             Past Medical History:  Diagnosis Date   High cholesterol    Hypothyroidism    Neuropathy     Past Surgical History:  Procedure Laterality Date   APPEN[age]CTOMY     78-18 yo   BACK SURGERY     Age 80   CESAREAN SECTION     ECTOPIC PREGNANCY SURGERY     LAPAROSCOPIC HYSTERECTOMY      There were no vitals filed for this visit.       [facility] PT Assessment - 08/28/21 1317       Assessment   Medical Diagnosis Lumbar radiculopathy    Referring Provider (PT) Dorena Dew, NP for [name], MD    Next MD Visit 09/19/21      Strength   Right Hip Flexion 4/5    Right Hip Extension 4+/5    Right Hip External Rotation  4-/5    Right Hip Internal Rotation 4/5    Right Hip ABduction 4+/5    Right Hip ADduction 4/5    Left Hip Flexion 4/5    Left Hip Extension 4+/5    Left Hip External Rotation 4-/5    Left Hip Internal Rotation 4/5    Left Hip ABduction 4+/5    Left Hip ADduction 4/5    Right Knee Flexion 5/5     Right Knee Extension 5/5    Left Knee Flexion 5/5    Left Knee Extension 5/5    Right Ankle Dorsiflexion 3+/5    Right Ankle Plantar Flexion 3/5   unable to initiate single leg heel raise; 4/5 with manual resistance   Right Ankle Inversion 4-/5    Right Ankle Eversion 4-/5    Left Ankle Dorsiflexion 3/5    Left Ankle Plantar Flexion 3/5   unable to initiate single leg heel raise; 4-/5 to 4/5 with manual resistance   Left Ankle Inversion 2-/5    Left Ankle Eversion 4-/5      Functional Gait  Assessment   Gait assessed  --   as of 08/23/21   Gait Level Surface Walks 20 ft in less than 7 sec but greater than 5.5 sec, uses assistive device, slower speed, mild gait deviations, or deviates 6-10 in outside of the 12 in walkway width.    Change in Gait Speed Able to smoothly change walking speed without loss of balance or gait deviation.  Deviate no more than 6 in outside of the 12 in walkway width.    Gait with Horizontal Head Turns Performs head turns smoothly with no change in gait. Deviates no more than 6 in outside 12 in walkway width    Gait with Vertical Head Turns Performs head turns with no change in gait. Deviates no more than 6 in outside 12 in walkway width.    Gait and Pivot Turn Pivot turns safely within 3 sec and stops quickly with no loss of balance.    Step Over Obstacle Is able to step over 2 stacked shoe boxes taped together (9 in total height) without changing gait speed. No evidence of imbalance.    Gait with Narrow Base of Support Ambulates less than 4 steps heel to toe or cannot perform without assistance.    Gait with Eyes Closed Cannot walk 20 ft without assistance, severe gait deviations or imbalance, deviates greater than 15 in outside 12 in walkway width or will not attempt task.    Ambulating Backwards Walks 20 ft, no assistive devices, good speed, no evidence for imbalance, normal gait    Steps Alternating feet, must use rail.    Total Score 22    FGA comment: 19-24 =  medium risk fall                                    PT Education - 2021-09-01 1359     Education Details HEP review helping pt identify most important ongoing stretches and exercises from both lumbar and cerivcal episodes    Person(s) Educated Patient    Methods Explanation;Demonstration;Verbal cues    Comprehension Verbalized understanding;Returned demonstration              PT Short Term Goals - 08/07/21 1323       PT SHORT TERM GOAL #1   Title Patient will be independent with initial HEP    Status Achieved   08/07/21   Target Date --      PT SHORT TERM GOAL #2   Title Patient will verbalize/demonstrate understanding of neutral spine posture and proper body mechanics to reduce strain on lumbar spine    Status Achieved   08/07/21              PT Long Term Goals - 01-Sep-2021 1330       PT LONG TERM GOAL #1   Title Patient will be independent with ongoing/advanced HEP for self-management at home    Status Achieved   08/23/21 - met for current HEP     PT LONG TERM GOAL #2   Title Patient will demonstrate improved B hip strength to >/= 4/5 and B ankle strength to >/= 3+/5 to 4-/5  for improved stability and ease of mobility    Status Partially Met   September 01, 2021     PT LONG TERM GOAL #3   Title Patient will ambulate with improved gait pattern with decreased B foot drop/slap and no evidence of instability/LOB    Status Partially Met   01-Sep-2021 - foot slap less pronounced/frequent on R vs L but still present B; stability seems to vary from day to day  per pt report   Target Date 08/30/21      PT LONG TERM GOAL #4   Title Patient will report ability to resume prior level of walking for exercise w/o limitation due to LE radiculopathy  Status Partially Met   08/28/21 - Pt reports she is "just about" at her prior level of walking in the neighborhood, with her main concern being her balance     PT LONG TERM GOAL #5   Title Patient will report  improved ability to climb in/out of bath w/o feeling of instability    Status Partially Met   08/28/21 - pt reports she does not feel as wobbly as she did earlier this year but still does not feel steady as she is not confident that her foot will clear the edge of the tub     PT LONG TERM GOAL #6   Title Patient will improve FGA score to >/= 19/30 to improve gait stability and reduce risk for falls    Baseline 15/30 on eval    Status Achieved   08/23/21 - FGA = 22/30                  Plan - 08/28/21 1358     Clinical Impression Statement Mettie reports benefit from PT with improved core strength and stability as well as postural control but continues to note fluctuations in sensation and balance control since the ESIs. She has demonstrated progress with her proximal LE strength but continues to demonstrate pronounced L>R ankle weakness which contributes to continued L>R foot slap during gait. She has demonstrated progress with dynamic gait stability as evidenced by improved FGA score of 22/30 and has been able to resume her walking for exercise at nearly her prior level but remains somewhat reserved due to balance concerns related to her altered LE sensation and motor control. Balance concerns and lack of sensory/proprioceptive feedback also contribute to her ongoing caution with tub transfers. Mandie has met her STGs along with LTGs #1 and 6, with remaining LTGs at least partially met. We have reviewed her HEPs and identified the most relevant ongoing stretches and strengthening exercises from both her lumbar and post-cervical fusion therapy episodes this year and she feels confident transitioning to her HEP while awaiting her f/u with the surgeon on 09/19/21. She will remain on hold for 30 days in the event that her MD indicates a need for her to resume/continue with PT.    Comorbidities C5-7 ACDF 09/21/20; idiopathic progressive neuropathy; hypthyroidism; DDD; remote h/o back surgery at age  61; h/o scoliosis    PT Treatment/Interventions ADLs/Self Care Home Management;Cryotherapy;Electrical Stimulation;Moist Heat;Ultrasound;DME Instruction;Gait training;Stair training;Functional mobility training;Therapeutic activities;Therapeutic exercise;Balance training;Neuromuscular re-education;Patient/family education;Manual techniques;Passive range of motion;Dry needling;Energy conservation;Taping;Vestibular;Spinal Manipulations;Joint Manipulations    PT Next Visit Plan transition to HEP + 30-day hold    PT Home Exercise Plan Access Code: [email or web address] (11/2, updated 11/8 & 11/29)    Consulted and Agree with Plan of Care Patient             Patient will benefit from skilled therapeutic intervention in order to improve the following deficits and impairments:  Abnormal gait, Decreased activity tolerance, Decreased balance, Decreased coordination, Decreased endurance, Decreased knowledge of use of DME, Decreased mobility, Decreased range of motion, Decreased safety awareness, Decreased strength, Difficulty walking, Increased fascial restricitons, Increased muscle spasms, Impaired perceived functional ability, Impaired flexibility, Impaired sensation, Dizziness, Impaired tone, Improper body mechanics, Postural dysfunction  Visit Diagnosis: Radiculopathy, lumbar region  Muscle weakness (generalized)  Foot drop, left  Foot drop, right  Other abnormalities of gait and mobility  Unsteadiness on feet     Problem List Patient Active Problem List   Diagnosis Date Noted   Idiopathic progressive neuropathy  04/06/2020   Ventricular premature beats 10/31/2015   Heart murmur 10/31/2015    Percival Spanish, PT 08/28/2021, 7:26 PM  [facility] [address]  [place], [place], [address] Phone: [phone]   Fax:  [phone]  Name: Sheryl Saintil MRN: [id number] Date of Birth: 08/05/1941   PHYSICAL THERAPY DISCHARGE  SUMMARY  Visits from Start of Care: 11  Current functional level related to goals / functional outcomes:   Refer to above clinical impression for status as of last visit on 08/28/2021. Patient was placed on hold for 30 days and has not needed to return to PT, therefore will proceed with discharge from PT for this episode.   Remaining deficits:   As above.   Education / Equipment:   HEP, Biomedical scientist education   Patient agrees to discharge. Patient goals were partially met. Patient is being discharged due to being pleased with the current functional level. Patient awaiting f/u with surgeon to determine next course of action as of last PT visit.  Percival Spanish, PT, MPT 10/08/21, 3:24 PM  [facility] [address]  [address] [place], [place], [address] Phone: [phone]   Fax:  [phone]

## 2021-08-31 ENCOUNTER — Encounter: Payer: Self-pay | Admitting: Nurse Practitioner

## 2021-09-14 ENCOUNTER — Telehealth: Payer: Self-pay | Admitting: Neurological Surgery

## 2021-09-14 NOTE — Telephone Encounter (Signed)
Lvm to patient [phone]) in regards to upcoming appt w/ Dr. Deloria Lair on 1/11 Wednesday. Notified pt of new appt time and date. Also instructed to call back to confirm new date and time.

## 2021-09-17 ENCOUNTER — Other Ambulatory Visit: Payer: Self-pay | Admitting: Family Medicine

## 2021-09-17 ENCOUNTER — Other Ambulatory Visit: Payer: Self-pay

## 2021-09-17 ENCOUNTER — Ambulatory Visit
Admission: RE | Admit: 2021-09-17 | Discharge: 2021-09-17 | Disposition: A | Discharge status: Home / Self Care | Payer: Medicare PPO | Source: Ambulatory Visit | Attending: Family Medicine | Admitting: Family Medicine

## 2021-09-17 ENCOUNTER — Ambulatory Visit
Admission: RE | Admit: 2021-09-17 | Discharge: 2021-09-17 | Disposition: A | Discharge status: Home / Self Care | Payer: Self-pay | Source: Ambulatory Visit

## 2021-09-17 DIAGNOSIS — Z981 Arthrodesis status: Secondary | ICD-10-CM

## 2021-09-19 ENCOUNTER — Encounter: Payer: Self-pay | Admitting: Neurological Surgery

## 2021-09-19 ENCOUNTER — Ambulatory Visit: Payer: Medicare PPO | Attending: Neurological Surgery | Admitting: Neurological Surgery

## 2021-09-19 ENCOUNTER — Other Ambulatory Visit: Payer: Self-pay

## 2021-09-19 VITALS — BP 175/84 | HR 103 | Resp 18 | Ht 61.5 in | Wt 122.6 lb

## 2021-09-19 DIAGNOSIS — Z981 Arthrodesis status: Secondary | ICD-10-CM | POA: Insufficient documentation

## 2021-09-19 DIAGNOSIS — M5416 Radiculopathy, lumbar region: Secondary | ICD-10-CM | POA: Insufficient documentation

## 2021-09-19 NOTE — Progress Notes (Signed)
[facility] Neurosurgery  Follow up Note    Previous Impression/Plan     Caleigha ONEDIA VARGUS is a 81 year old female with significant lumbar spondylosis, with moderate central stenosis at L2-3, mild to moderate central stenosis at L3-4, with right lateral recess stenosis at L1-2, bilateral lateral recess stenosis at L2-3, bilateral lateral recess stenosis at L4-5, left lateral recess stenosis at L5-S1.  At this time, my recommendation would be to try and manage symptoms conservatively with physical therapy and epidural steroid injections.  I will refer the patient for epidural steroid injection at L2-3, and then in a 3-week delayed fashion epidural steroid injection at L4-5.  She will plan to follow-up with [place] approximately 2 to 3 weeks after her second epidural steroid injection for reevaluation of symptoms.    HPI     Chief Complaint   Patient presents with    Follow-up     Lumbar radiculopathy   DOS 09/21/20  S/P C5-7 ACDF  ESI - helped a little but not significantly   RE EVAL SX      Roy MAURA BRAATEN is a 81 year-old female one year s/p C5-7 ACDF, following up today with gait instability, BLE numbness. Notes a 20% improvement in numbness after ESI at L2-3 and L4-5. Feels her sensation is improving in her feet as well as motor function-able to pick up a marble with her toes, able to feel manicurist with toenail treatments. Does not endorse any pain. Numbness is worse in RLE compared to LLE. Has tightness if right calf, LLE foot drop.     Physical Examination   VITAL SIGNS:   height is 1.562 m (5' 1.5") and weight is 55.6 kg (122 lb 9.6 oz). Her blood pressure is 175/84 and her pulse is 103 (abnormal). Her respiration is 18.        Awake, alert, oriented x3, Follows commands  GCS: 15   Speech is clear  Attention span and concentration: intact  Recent and remote memory: intact     Gait Intact  Able to walk on toes and heels     Point tenderness: None      Motor Exam:                            R          L             Deltoid                                     5          5  Tricep                                      5          5  Bicep                                       5          5  Wrist Extension                       5  5  Wrest Flexion                          5          5  Finger Abduction                     5          5  Grip                                         5          5     L2-3     Iliopsoas (Hip Flexion)                        5          5  L3-4     Quadriceps (Knee Extension)             5          5  L5-S1   Hamstring (Knee Flexion)                   5          5  L4-5     Tibialis Anterior (Foot Dorsiflexion)    4          3  S1        Gastrocsoleus (Plantar Flexion)         5          5  L5        EHL (Toe Dorsiflexion)                       4          3     Rapid alternating movements intact     Decreased sensation to LT and PP along the RLE from the hip to the foot, and decreased sensation LLE foot                Reflexes:  DTRs symmetric, 1+ throughout        Clonus: right negative, left negative   Babinski: right negative, left negative   Hoffman's: right  negative, left negative     Review of Systems   A review of systems is otherwise negative for all systems other than what has been indicated in the HPI.      Radiology Interpretation   No results found.    Impression/Plan     Margret ESTERLENE ATIYEH is a 81 year-old female with multilevel spondylosis, bilateral lower extremity numbness. Again, we reviewed the EMG/NCV demonstrating a chronic length dependent neuropathy in the lower extremities. Would recommend continuation of conservative measures as she has noted some improvement. Would not recommend surgical intervention in absence of pain, acute neurological deficit. Advised patient to use fish oil, vitamin B12. Advised patient she may try another ESI. May consider surgical intervention in presence of pain.     With respect to cervical spine, xray studies appear stable. She will plan to  follow-up in six months with repeat x-ray studies of the cervical spine.    Follow-up   6 months with xray cervical    All relevant and clinical information was transcribed by me, Shanda Bumps  Allena Katz, NP-C, acting as a scribe for Dr. [name].    Marlaine Hind, MD

## 2021-10-26 ENCOUNTER — Encounter: Payer: Self-pay | Admitting: Anesthesiology

## 2021-11-13 ENCOUNTER — Ambulatory Visit
Admission: RE | Admit: 2021-11-13 | Discharge: 2021-11-13 | Disposition: A | Discharge status: Home / Self Care | Payer: Medicare PPO | Source: Ambulatory Visit | Attending: Anesthesiology | Admitting: Anesthesiology

## 2021-11-13 ENCOUNTER — Encounter: Payer: Self-pay | Admitting: Anesthesiology

## 2021-11-13 DIAGNOSIS — M5416 Radiculopathy, lumbar region: Secondary | ICD-10-CM | POA: Insufficient documentation

## 2021-11-13 DIAGNOSIS — M48062 Spinal stenosis, lumbar region with neurogenic claudication: Secondary | ICD-10-CM

## 2021-11-13 MED ORDER — METHYLPREDNISOLONE ACETATE 40 MG/ML IJ SUSP
40.0000 mg | Freq: Once | INTRAMUSCULAR | Status: AC
Start: 2021-11-13 — End: 2021-11-13
  Administered 2021-11-13: 40 mg via EPIDURAL

## 2021-11-13 MED ORDER — BUPIVACAINE HCL (PF) 0.25 % IJ SOLN
5.0000 mL | Freq: Once | INTRAMUSCULAR | Status: AC
Start: 2021-11-13 — End: 2021-11-13
  Administered 2021-11-13: 5 mL

## 2021-11-13 MED ORDER — IOHEXOL 180 MG/ML IJ SOLN
2.0000 mL | Freq: Once | INTRAMUSCULAR | Status: AC
Start: 2021-11-13 — End: 2021-11-13
  Administered 2021-11-13: 2 mL

## 2021-11-13 MED ORDER — METHYLPREDNISOLONE ACETATE 40 MG/ML IJ SUSP
INTRAMUSCULAR | Status: AC
Start: 2021-11-13 — End: ?
  Filled 2021-11-13: qty 1

## 2021-11-13 MED ORDER — BUPIVACAINE HCL (PF) 0.25 % IJ SOLN
INTRAMUSCULAR | Status: AC
Start: 2021-11-13 — End: ?
  Filled 2021-11-13: qty 10

## 2021-11-13 NOTE — Addendum Note (Signed)
Encounter addended by: Rodena Piety, RN on: 11/13/2021 4:28 PM   Actions taken: Flowsheet accepted, MAR administration accepted

## 2021-11-13 NOTE — Patient Instructions (Signed)
[facility]  [facility]  [facility]  [address]  [place], [place] [address]  T: [phone]    What to expect    It is normal to feel improvement in pain for a few hours if local anesthetic used for the procedure.  If no local anesthetic used for the procedure, or once the local anesthetic wears off, you may feel worsening of your pain for a few days.  If you are tender at the injection site you may use an ice pack wrapped in a towel for twenty (20) minutes three to four times a day    The pain relief from the steroid typically takes 2 to 5 days to see an effect.    Instructions    No immersion in water for 24 hours (no bath, no hot tub, no Jacuzzi etc.)  May shower after 6 hours if desired    You may resume your normal diet and medication regimen unless directed otherwise by your provider.  If you are taking a blood thinner, ask when it is acceptable to restart as this will vary depending upon the medication and the procedure you underwent    You may resume regular activities as your comfort level allows, but do not do more than you would on a typical day    The skin in the area where your procedure was performed, will have a discoloration from the skin cleanser used for the procedure.  The current recommendation is not to wash this off immediately following the procedure, unless it is causing problems like itching or rash.  The disinfectant used is typically colored blue or orange.  This is normal and you may wash it off after 2 hours if desired.    You may remove bandage that we placed over the injection site after 2 hours, if desired    Potential Side effects    Temporary side effects of steroids could include:  Redness in the face  Swelling in the extremities  Insomnia  Increase in blood sugar level    If local anesthesia used for the procedure may have mild and temporary numbness or weakness.  This typically resolves in a few hours.  If  you notice severe numbness or weakness please call the office (or if after hours go to emergency room for evaluation).  If the numbness or weakness does not resolve after 12 hours please call the office.    When to call the office    Please call the office for the following:  Temperature greater than 100.3 F  Severe headache  Severe back pain that does not go away with over-the-counter medications (or if after hours go to emergency room for evaluation).   New onset numbness or weakness that is progressively getting worse (or if after hours go to emergency room for evaluation).   Redness at the injection site that is getting bigger, or discharge from the injection site

## 2021-11-13 NOTE — Procedures (Signed)
[facility]  [facility]  [facility]  [address]  [place], [place] [address]  T: [phone] F: [phone]    Stephanie DONALSON   DOB: 1940/12/17   MRN: [id number]   Date of Service: November 13, 2021      Pain Management Physician: Karmen Bongo, MD  PCP: Pcp, None, MD    Procedure Note:    Diagnosis:   Lumbar Radiculopathy  Spinal Stenosis with Neurogenic claudication, Lumbar     Procedure: Right and Left L4-5 Transforaminal Epidural Steroid Injection with Fluoroscopic Guidance     Anesthesia: Local     Indications:      Stephanie Castaneda is a 81 year-old female who presents with persistent and refractory bilateral lower extremity numbness.  She presents today as a referral from Dr. Deloria Lair for epidural steroid injections at the L2-3 level to be separated at least 2 weeks apart then by ESI's at the L4-5 level to help diagnose whether her numbness is caused from stenosis at the L2-3 versus L4-5 levels.  If she has benefit that is short-lived Dr. Deloria Lair is then considering decompressive surgery.       Interval History:     Stephanie Castaneda underwent TFESI's at L4-5 on August 10, 2021 and noted 50+% improvement in her symptoms for over three months.  Prior to that injection, I had performed L2-3 TFESI's on July 20, 2021,  She notes about a 20-30% improvement in her symptoms.  She presents today for repeat injections at L4-5 as she had more improvement in her symptoms from that injection.. Dr Deloria Lair not considering surgery at this time.     Pain history:     Stephanie Castaneda has a long history of of low back pain issues have been intermittent and have resolved with conservative care.       When she was 81 years of age she needed a back surgery for a ruptured disc which had calcified.  At that time she had excruciating back pain, she needed a myelogram for testing. Around 2018 the patient started noticing numbness in the left leg, starting in the  thigh and going down the leg.  She had detailed testing at [facility] which showed peripheral neuropathy for which she had detailed blood work which did not reveal any etiology.       The patient saw another neurologist in this area and was diagnosed with bilateral foot drop.  At that time she was also diagnosed with severe cervical spinal stenosis and she saw Dr. Deloria Lair.  She had surgery and noticed significant improvement in her hand symptoms.  She was referred here by neurosurgery for numbness in the legs.  She started noticing numbness on the right thigh in 2021.  She has noticed worsening of the left foot drop as well.       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     Current Outpatient Medications   Medication Sig Dispense Refill    rosuvastatin (CRESTOR) 10 MG tablet Take 1 tablet (10 mg) by mouth daily      solifenacin (VESICARE) 10 MG tablet Take 1 tablet (10 mg) by mouth daily      Synthroid 88 MCG tablet Take 1 tablet (88 mcg) by mouth daily       Current Facility-Administered Medications  Medication Dose Route Frequency Provider Last Rate Last Admin    bupivacaine (PF) (MARCAINE) 0.25 % injection 5 mL  5 mL Other Once Genevia Bouldin, Gloriajean Dell, MD        iohexol (OMNIPAQUE) 180 MG/ML injection SOLN 2 mL  2 mL Other Once Makeda Peeks, Gloriajean Dell, MD        methylPREDNISolone acetate (DEPO-MEDROL) injection 40 mg  40 mg Epidural Once Ellia Knowlton, Gloriajean Dell, MD            Allergies   Allergen Reactions    Oxycodone Nausea And Vomiting    Hydrocodone Nausea And Vomiting        Social History     Tobacco Use    Smoking status: Never    Smokeless tobacco: Never   Vaping Use    Vaping Use: Never used   Substance Use Topics    Alcohol use: Not Currently    Drug use: Not Currently     MRI of the lumbar spine dated July 28, 2020.     TECHNIQUE: MRI of the  lumbar spine performed on a 3.0 Tesla scanner  without intravenous contrast.        FINDINGS:   There is a normal lumbar lordosis. There are multilevel Schmorl's nodes.  The overall vertebral body heights are maintained. There is a 4 mm grade  1 retrolisthesis of L1 on L2. There is a 3 Miller grade 1 retrolisthesis  of L2 on L3. There is degenerative disc disease with multilevel loss of  disc height, disc bulging and endplate osteophytic spurring. The conus  medullaris is normal and terminates at L1.     At L1-L2, there is diffuse disc bulging and endplate osteophytic  spurring. There is facet hypertrophy. There is no spinal canal stenosis.  There is mild to moderate right neural foraminal stenosis.     At L2-L3, there is diffuse disc bulging and endplate osteophytic  spurring. There is facet and ligamentum flavum hypertrophy. There is  moderate spinal canal stenosis. There is mild bilateral neural foraminal  stenosis.     At L3-L4, there is diffuse disc bulging and endplate osteophytic  spurring. There is facet and ligamentum flavum hypertrophy. There is  mild spinal canal stenosis. There is mild bilateral neural foraminal  stenosis.     At L4-L5, there is irregularity of the lamina, suggestive of prior  laminectomy. There is diffuse disc bulging and endplate osteophytic  spurring. There is facet hypertrophy. There is no spinal canal stenosis.  There is mild right and moderate left neural foraminal stenosis.     At L5-S1, there is irregularity of the left lamina which may reflect  previous laminectomy. There is disc bulging and endplate osteophytic  spurring. There is facet hypertrophy. There is no spinal canal stenosis.  There is mild right and moderate left neural foraminal stenosis.     Overall, degenerative findings are similar compared to July 28, 2020.     IMPRESSION:      1.  Postoperative and degenerative changes of the lumbar spine as  described, similar compared to July 28, 2020. At L2-L3, there  is  moderate spinal canal stenosis due to disc bulging, endplate osteophytic  spurring, facet and ligamentum flavum hypertrophy. Milder spinal canal  stenosis is seen at L3-L4. There is multilevel neural foraminal  stenosis.    Physical Exam   Visit Vitals  BP 150/75 (BP Site: Right arm, Patient Position: Sitting, Cuff Size: Medium)   Pulse 92   Temp  97.9 F (36.6 C) (Oral)   Resp 15   Wt 57 kg (125 lb 10.6 oz)   SpO2 97%   BMI 23.36 kg/m      GENERAL: Well developed, well nourished, no acute distress.  Normal mood and affect.   ABDOMEN: Non-distended.   NEUROLOGIC: Sensory and Motor exam grossly intact; gait -- normal  SPINE/MUSCULOSKELETAL:   ROM lumbar spine: decreased   Spinal curvature: normal   No palpable trigger points.   SKIN: No evidence of swelling, edema, rashes, or infection on exposed areas of skin.   PSYCHIATRIC: The patient is alert and oriented.  Affect is appropriate.  she does not appear sedated or somnolent on exam.      Description of Procedure:      The risk benefits and alternatives were reviewed and the patient agreed to proceed.  I had the patient either sign a written informed consent for serial procedures over the next six months or I confirmed there was a valid prior signed informed consent for serial procedures after confirming there were no further questions or concerns.  The patient was then taken to the procedure room and positioned prone.  I prepped the back with ChloraPrep, allowed adequate time for the ChloraPrep to dry, and then draped the back in usual sterile fashion.  I wore a mask and sterile gloves for the procedure.  Using fluoroscopic guidance identified the right and left L4-5 neural foramen and anesthetized the skin and underlying tissues above with 3 cc's 1% lidocaine.  I then advanced a 3-1/2 inch 23-gauge Quincke spinal needle into the neural foramen.  There were no paresthesias needle placement.  Injection of 2 cc's Omnipaque 180 outlined epidural spread.  There is  no vascular runoff.  I then injected a solution containing a mixture of  1cc 1% lidocaine mixed with 20mg  methylprednisolone at each foramen.  I flushed the needle before removal and had a sterile bandage applied to the needle insertion site.  Patient tolerated the procedures well and was taken to the recovery area in stable condition.     Fluoroscopy Data:   Refer to imaging section in Epic     Medications used for the procedure:  Lot numbers and amount used saved in Epic     Plan:      Home Exercise Program  Activity Modifications  Continue Pain Medications as currently prescribed  Follow up in 3 months for repeat ESI, if needed    Glendell Docker, MD  [facility] Program Director for Pain Medicine  Assistant Professor, [employer] [facility]  Team Physician, Pain Medicine, [facility] Football Team  [facility]  [address]  [place], [place] [address]  T [phone]          This note was generated in part by the Epic EMR system/ Dragon speech recognition and may contain errors or omissions not intended by the user. Grammatical errors, random word insertions, deletions, pronoun errors and incomplete sentences are occasional consequences of this technology due to software limitations. Not all errors are caught or corrected. If there are questions or concerns about the content of this note or information contained within the body of this dictation they should be addressed directly with the author for clarification

## 2021-11-22 ENCOUNTER — Other Ambulatory Visit: Payer: Self-pay

## 2021-11-22 ENCOUNTER — Ambulatory Visit: Payer: Medicare PPO | Attending: Registered" | Admitting: Physical Therapy

## 2021-11-22 ENCOUNTER — Encounter: Payer: Self-pay | Admitting: Physical Therapy

## 2021-11-22 DIAGNOSIS — M6281 Muscle weakness (generalized): Secondary | ICD-10-CM | POA: Insufficient documentation

## 2021-11-22 DIAGNOSIS — R2689 Other abnormalities of gait and mobility: Secondary | ICD-10-CM | POA: Diagnosis present

## 2021-11-22 DIAGNOSIS — M21372 Foot drop, left foot: Secondary | ICD-10-CM | POA: Insufficient documentation

## 2021-11-22 DIAGNOSIS — M5416 Radiculopathy, lumbar region: Secondary | ICD-10-CM | POA: Diagnosis not present

## 2021-11-22 DIAGNOSIS — R2681 Unsteadiness on feet: Secondary | ICD-10-CM | POA: Insufficient documentation

## 2021-11-22 DIAGNOSIS — M21371 Foot drop, right foot: Secondary | ICD-10-CM | POA: Insufficient documentation

## 2021-11-22 NOTE — Therapy (Addendum)
[facility] ?[address] ?[place], [place], [address] ?Phone: [phone]   Fax:  [phone] ? ?Physical Therapy Evaluation ? ?Patient Details  ?Name: [name] Trujillo ?MRN: [id number] ?Date of Birth: 1941/02/06 ?Referring Provider (PT): Dorena Dew, NP for [name], MD ? ? ?Encounter Date: 11/22/2021 ? ? PT End of Session - 11/22/21 1133   ? ? Visit Number 1   ? Number of Visits 20   ? Date for PT Re-Evaluation 02/14/22   ? Authorization Type [employer]   ? PT Start Time 1102   ? PT Stop Time 1218   ? PT Time Calculation (min) 76 min   ? Activity Tolerance Patient tolerated treatment well   ? Behavior During Therapy [facility] for tasks assessed/performed   ? ?  ?  ? ?  ? ? ?Past Medical History:  ?Diagnosis Date  ? High cholesterol   ? Hypothyroidism   ? Neuropathy   ? ? ?Past Surgical History:  ?Procedure Laterality Date  ? APPENDECTOMY    ? 47-18 yo  ? BACK SURGERY    ? Age 30  ? CESAREAN SECTION    ? ECTOPIC PREGNANCY SURGERY    ? LAPAROSCOPIC HYSTERECTOMY    ? ? ?There were no vitals filed for this visit. ? ? ? Subjective Assessment - 11/22/21 1121   ? ? Subjective Pt reports her f/u with Dr. Shawn Stall was inconclusive in determing which of the [place] had produced the relief/improvement in her R LE following her initial round of ESI to L2-3 and L4-5, therefore he wanted to try just a single level injection at L4-5. She had to wait 3 months following the initial injections, therefore injection only completed recently on 11/13/21. She notes in the time since the completion of our last PT episode, she has seen a gradual decline in her R ankle motion/control. She has also noted a decline in her balance since receiving the most recent injection.   ? Pertinent History 09/21/20 - ACDF C5-6-7   ? Diagnostic tests 06/12/21 - Lumbar MRI: Postoperative and degenerative changes of the lumbar spine as described, similar compared to July 28, 2020. At L2-L3,  there is moderate spinal canal stenosis due to disc bulging, endplate osteophytic spurring, facet and ligamentum flavum hypertrophy. Milder spinal canal stenosis is seen at L3-L4. There is multilevel neural foraminal stenosis.   ? Patient Stated Goals "to walk with a more normal gait and feel steadier"   ? Currently in Pain? No/denies   ? ?  ?  ? ?  ? ? ? ? ? [facility] PT Assessment - 11/22/21 1102   ? ?  ? Assessment  ? Medical Diagnosis Lumbar radiculopathy   ? Referring Provider (PT) Dorena Dew, NP for [name], MD   ? Onset Date/Surgical Date --   chronic  ? Next MD Visit 03/20/22   ? Prior Therapy PT s/p ACDF and for lumbar radiculopathy in 2022; PT in 2018 & 2020/2021 for abnormality of gait due to peripheral neuropathy & L foot drop   ?  ? Precautions  ? Precautions Fall   ?  ? Balance Screen  ? Has the patient fallen in the past 6 months No   ? Has the patient had a decrease in activity level because of a fear of falling?  Yes   ? Is the patient reluctant to leave their home because of a fear of falling?  No   ?  ?  Home Environment  ? Living Environment Private residence   ? Living Arrangements Spouse/significant other   ? Type of Home House   ? Home Access Stairs to enter   ? Entrance Stairs-Number of Steps 5-6   ? Entrance Stairs-Rails Left   ? Home Layout One level;Laundry or work area in basement   ?  ? Prior Function  ? Level of Independence Independent   ? Vocation Retired   ? Leisure walking daily for total of ~1.5 miles; working in the yard; sewing/quilting   ?  ? Cognition  ? Overall Cognitive Status Within Functional Limits for tasks assessed   ?  ? Observation/Other Assessments  ? Focus on Therapeutic Outcomes (FOTO)  Lumbar = 34, predicted D/C FS = 50   ?  ? Sensation  ? Additional Comments numbness in ~L4 & L5 distribution R>L with tingling in R>L ankle and foot   ?  ? Coordination  ? Gross Motor Movements are Fluid and Coordinated No   uncoordinated rapid alternating movements DF/PF  ? Heel  Shin Test less coordinated with R heel on L shin than reverse   ?  ? AROM  ? Lumbar Flexion hands to mid shins   ? Lumbar Extension 25% limited   ? Lumbar - Right Side Bend hand to fibular head   ? Lumbar - Left Side Bend hand to fibular head   ? Lumbar - Right Rotation 50% limited   ? Lumbar - Left Rotation 30% limited   ?  ? Strength  ? Right Hip Flexion 4-/5   ? Right Hip Extension 4+/5   ? Right Hip External Rotation  3+/5   ? Right Hip Internal Rotation 4-/5   ? Right Hip ABduction 4/5   ? Right Hip ADduction 4/5   ? Left Hip Flexion 4-/5   ? Left Hip Extension 4+/5   ? Left Hip External Rotation 3+/5   ? Left Hip Internal Rotation 4-/5   ? Left Hip ABduction 4+/5   ? Left Hip ADduction 4/5   ? Right Knee Flexion 5/5   ? Right Knee Extension 4+/5   ? Left Knee Flexion 5/5   ? Left Knee Extension 4+/5   ? Right Ankle Dorsiflexion 3/5   ? Right Ankle Plantar Flexion 3/5   unable to initiate single leg heel raise; 4/5 with manual resistance  ? Right Ankle Inversion 3+/5   ? Right Ankle Eversion 3+/5   ? Left Ankle Dorsiflexion 3-/5   ? Left Ankle Plantar Flexion 3/5   unable to initiate single leg heel raise; 4-/5 to 4/5 with manual resistance  ? Left Ankle Inversion 2-/5   ? Left Ankle Eversion 3+/5   ?  ? Flexibility  ? Soft Tissue Assessment /Muscle Length yes   ? Hamstrings mild/mod tightness B   ? Quadriceps mod tight quads > hip flexors B   ? Piriformis mod tightness B   ?  ? Slump test  ? Findings Positive   ? Side Right   >Lt  ? Comment increased numbness & tingling   ?  ? Straight Leg Raise  ? Findings Positive   ? Side  Right   ? Comment increased numbness   ?  ? Ambulation/Gait  ? Ambulation/Gait Assistance 5: Supervision   ? Assistive device Other (Comment)   single walking/hiking pole  ? Gait Pattern Step-through pattern;Ataxic;Lateral hip instability;Decreased dorsiflexion - right;Decreased dorsiflexion - left   L>R foot slap  ? Ambulation Surface Level;Indoor   ?  ?  Balance  ? Balance Assessed Yes    ?  ? Static Standing Balance  ? Static Standing - Balance Support Right upper extremity supported   walking/hiking pole  ? Static Standing - Level of Assistance 5: Stand by assistance;4: Min assist   ? Static Standing - Comment/# of Minutes 2   ? ?  ?  ? ?  ? ? ? ? ? ? ? ? ? ? ? ? ? ?Objective measurements completed on examination: See above findings.  ? ? ? ? ? ? ? ? ? ? ? ? ? ? PT Education - 11/22/21 1215   ? ? Education Details Eval findings, review of prior HEP identifying which exercises to resume at this time   ? Person(s) Educated Patient   ? Methods Explanation;Demonstration;Verbal cues   ? Comprehension Verbalized understanding   ? ?  ?  ? ?  ? ? ? PT Short Term Goals - 11/22/21 1218   ? ?  ? PT SHORT TERM GOAL #1  ? Title Patient will be independent with initial HEP   ? Status New   ? Target Date 01/03/22   ?  ? PT SHORT TERM GOAL #2  ? Title Complete balance assessment an update LTGs as indicated   ? Status New   ? Target Date 12/20/21   ? ?  ?  ? ?  ? ? ? ? PT Long Term Goals - 11/22/21 1218   ? ?  ? PT LONG TERM GOAL #1  ? Title Patient will be independent with ongoing/advanced HEP for self-management at home   ? Status New   ? Target Date 02/14/22   ?  ? PT LONG TERM GOAL #2  ? Title Patient will demonstrate improved B hip strength to >/= 4/5 and R ankle strength to >/= 3+/5 to 4-/5  for improved stability and ease of mobility   ? Status New   ? Target Date 02/14/22   ?  ? PT LONG TERM GOAL #3  ? Title Patient will ambulate with improved gait pattern with decreased B foot drop/slap and no evidence of instability/LOB   ? Status New   ? Target Date 02/14/22   ?  ? PT LONG TERM GOAL #4  ? Title Patient will report improved comfort with stair negotiation for safe community access   ? Status New   ? Target Date 02/14/22   ?  ? PT LONG TERM GOAL #5  ? Title Patient will report improved feeling of stability with working in her garden   ? Status New   ? Target Date 02/14/22   ? ?  ?  ? ?  ? ? ? ? ? ? ? ? ?  Plan - 11/22/21 1334   ? ? Clinical Impression Statement Gea is a 81 y/o female who presents to OP PT for worsening lumbar radiculopathy. She is well-known to this therapist having completed PT episodes

## 2021-11-23 ENCOUNTER — Encounter: Payer: Self-pay | Admitting: Nurse Practitioner

## 2021-11-27 ENCOUNTER — Ambulatory Visit: Payer: Medicare PPO | Admitting: Physical Therapy

## 2021-11-27 ENCOUNTER — Other Ambulatory Visit: Payer: Self-pay

## 2021-11-27 DIAGNOSIS — M5416 Radiculopathy, lumbar region: Secondary | ICD-10-CM

## 2021-11-27 DIAGNOSIS — M6281 Muscle weakness (generalized): Secondary | ICD-10-CM

## 2021-11-27 DIAGNOSIS — R2681 Unsteadiness on feet: Secondary | ICD-10-CM

## 2021-11-27 DIAGNOSIS — M21371 Foot drop, right foot: Secondary | ICD-10-CM

## 2021-11-27 DIAGNOSIS — M21372 Foot drop, left foot: Secondary | ICD-10-CM

## 2021-11-27 DIAGNOSIS — R2689 Other abnormalities of gait and mobility: Secondary | ICD-10-CM

## 2021-11-27 NOTE — Therapy (Addendum)
[facility] [address]  [address] [place], [place], [address] Phone: [phone]   Fax:  [phone]  Physical Therapy Treatment  Patient Details  Name: [name] Trujillo MRN: [id number] Date of Birth: [birth year] Referring Provider (PT): Rondel Oh, NP for [name], MD   Encounter Date: 11/27/2021   PT End of Session - 11/27/21 1018     Visit Number 2    Number of Visits 20    Date for PT Re-Evaluation 02/14/22    Authorization Type [employer]    Authorization Time Period 11/22/21 - 02/14/22    Authorization - Visit Number 1    Authorization - Number of Visits 10    PT Start Time 1018    PT Stop Time 1100    PT Time Calculation (min) 42 min    Activity Tolerance Patient tolerated treatment well    Behavior During Therapy [facility] for tasks assessed/performed             Past Medical History:  Diagnosis Date   High cholesterol    Hypothyroidism    Neuropathy     Past Surgical History:  Procedure Laterality Date   APPENDECTOMY     55-18 yo   BACK SURGERY     Age 49   CESAREAN SECTION     ECTOPIC PREGNANCY SURGERY     LAPAROSCOPIC HYSTERECTOMY      There were no vitals filed for this visit.       [facility] PT Assessment - 11/27/21 1018       Ambulation/Gait   Ambulation/Gait Assistance 5: Supervision    Assistive device Other (Comment)   single walking/hiking pole   Gait Pattern Step-through pattern;Ataxic;Lateral hip instability;Decreased dorsiflexion - right;Decreased dorsiflexion - left   L>R foot slap   Gait velocity 2.64 ft/sec w/o AD; 2.66 ft/sec with hiking pole      Standardized Balance Assessment   Standardized Balance Assessment Berg Balance Test;10 meter walk test    10 Meter Walk 12.44 sec w/o AD; 12.32 sec with hiking pole      Berg Balance Test   Sit to Stand Able to stand without using hands and stabilize independently    Standing Unsupported Able to stand 2 minutes with  supervision    Sitting with Back Unsupported but Feet Supported on Floor or Stool Able to sit safely and securely 2 minutes    Stand to Sit Sits safely with minimal use of hands    Transfers Able to transfer safely, minor use of hands    Standing Unsupported with Eyes Closed Able to stand 10 seconds with supervision    Standing Unsupported with Feet Together Able to place feet together independently and stand for 1 minute with supervision    From Standing, Reach Forward with Outstretched Arm Can reach forward >12 cm safely (5")    From Standing Position, Pick up Object from Floor Able to pick up shoe safely and easily    From Standing Position, Turn to Look Behind Over each Shoulder Turn sideways only but maintains balance    Turn 360 Degrees Needs close supervision or verbal cueing    Standing Unsupported, Alternately Place Feet on Step/Stool Able to complete 4 steps without aid or supervision    Standing Unsupported, One Foot in Front Able to take small step independently and hold 30 seconds   rear foot/leg ER   Standing on One Leg Tries to lift leg/unable to hold 3 seconds but remains  standing independently    Total Score 40    Berg comment: 37-45 significant risk for falls (>80%)      Functional Gait  Assessment   Gait Level Surface Walks 20 ft in less than 7 sec but greater than 5.5 sec, uses assistive device, slower speed, mild gait deviations, or deviates 6-10 in outside of the 12 in walkway width.    Change in Gait Speed Able to change speed, demonstrates mild gait deviations, deviates 6-10 in outside of the 12 in walkway width, or no gait deviations, unable to achieve a major change in velocity, or uses a change in velocity, or uses an assistive device.    Gait with Horizontal Head Turns Performs head turns smoothly with slight change in gait velocity (eg, minor disruption to smooth gait path), deviates 6-10 in outside 12 in walkway width, or uses an assistive device.    Gait with  Vertical Head Turns Performs task with slight change in gait velocity (eg, minor disruption to smooth gait path), deviates 6 - 10 in outside 12 in walkway width or uses assistive device    Gait and Pivot Turn Pivot turns safely in greater than 3 sec and stops with no loss of balance, or pivot turns safely within 3 sec and stops with mild imbalance, requires small steps to catch balance.    Step Over Obstacle Is able to step over 2 stacked shoe boxes taped together (9 in total height) without changing gait speed. No evidence of imbalance.    Gait with Narrow Base of Support Ambulates less than 4 steps heel to toe or cannot perform without assistance.    Gait with Eyes Closed Cannot walk 20 ft without assistance, severe gait deviations or imbalance, deviates greater than 15 in outside 12 in walkway width or will not attempt task.    Ambulating Backwards Walks 20 ft, uses assistive device, slower speed, mild gait deviations, deviates 6-10 in outside 12 in walkway width.    Steps Alternating feet, must use rail.    Total Score 17    FGA comment: 19-24 = medium risk fall                                      PT Short Term Goals - 11/27/21 1238       PT SHORT TERM GOAL #1   Title Patient will be independent with initial HEP    Status On-going    Target Date 01/03/22      PT SHORT TERM GOAL #2   Title Complete balance assessment an update LTGs as indicated    Status Achieved  11/27/21   Target Date 12/20/21               PT Long Term Goals - 11/27/21 1100       PT LONG TERM GOAL #1   Title Patient will be independent with ongoing/advanced HEP for self-management at home    Status On-going    Target Date 02/14/22      PT LONG TERM GOAL #2   Title Patient will demonstrate improved B hip strength to >/= 4/5 and R ankle strength to >/= 3+/5 to 4-/5  for improved stability and ease of mobility    Status On-going    Target Date 02/14/22      PT LONG TERM GOAL  #3   Title Patient will ambulate with improved gait pattern  with decreased B foot drop/slap and no evidence of instability/LOB    Status On-going    Target Date 02/14/22      PT LONG TERM GOAL #4   Title Patient will report improved comfort with stair negotiation for safe community access    Status On-going    Target Date 02/14/22      PT LONG TERM GOAL #5   Title Patient will report improved feeling of stability with working in her garden    Status On-going    Target Date 02/14/22      PT LONG TERM GOAL #6   Title Patient will improve FGA score to >/= 21/30 to improve gait stability and reduce risk for falls    Baseline 17/30 (11/27/21)    Status New    Target Date 02/14/22                   Plan - 11/27/21 1100     Clinical Impression Statement Balance assessment completed with pt demonstrating decline from prior testing in this clinic - Berg currently 40/56 (significant (>80%) risk for falls) as compared to 49/56 as of 05/29/21 and FGA currently 17/30 (high risk for falls) as compared with 22/30 as of 08/23/21. Gait speed essentially unchanged w/o AD (2.64 ft/sec vs 2.68 ft/sec previously) but slightly decreased with hiking/walking pole (2.66 ft/sec vs 3.03 ft/sec) with increased ataxia and sway noted as compared to previously. Pt also more easily fatigued during activities requiring more frequent seated rest breaks and leaving no time for HEP review, thus will address this next visit.    Comorbidities C5-7 ACDF 09/21/20; idiopathic progressive neuropathy; hypthyroidism; DDD; remote h/o back surgery at age 69; h/o scoliosis    Rehab Potential Good    PT Frequency 2x / week    PT Duration 12 weeks   8-12 weeks - due to scheduling availability cert window will extend for 12 weeks   PT Treatment/Interventions ADLs/Self Care Home Management;Cryotherapy;Electrical Stimulation;Moist Heat;Ultrasound;DME Instruction;Gait training;Stair training;Functional mobility training;Therapeutic  activities;Therapeutic exercise;Balance training;Neuromuscular re-education;Patient/family education;Manual techniques;Passive range of motion;Dry needling;Energy conservation;Taping;Vestibular;Spinal Manipulations;Joint Manipulations    PT Next Visit Plan further review/update of HEP including balance activities    PT Home Exercise Plan Access Code: [email or web address]    Consulted and Agree with Plan of Care Patient             Patient will benefit from skilled therapeutic intervention in order to improve the following deficits and impairments:  Abnormal gait, Decreased activity tolerance, Decreased balance, Decreased coordination, Decreased endurance, Decreased knowledge of use of DME, Decreased mobility, Decreased range of motion, Decreased safety awareness, Decreased strength, Difficulty walking, Increased fascial restricitons, Increased muscle spasms, Impaired perceived functional ability, Impaired flexibility, Impaired sensation, Dizziness, Impaired tone, Improper body mechanics, Postural dysfunction  Visit Diagnosis: Radiculopathy, lumbar region  Muscle weakness (generalized)  Foot drop, left  Foot drop, right  Other abnormalities of gait and mobility  Unsteadiness on feet     Problem List Patient Active Problem List   Diagnosis Date Noted   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   Ventricular premature beats 10/31/2015   Heart murmur 10/31/2015    Marry Guan, PT 11/27/2021, 3:20 PM  [facility] [address]  [address] [place], [place], [address] Phone: [phone]   Fax:  [phone]  Name: Anaijah Seman MRN: [id number] Date of Birth: [birth year]

## 2021-12-04 ENCOUNTER — Encounter: Payer: Self-pay | Admitting: Physical Therapy

## 2021-12-04 ENCOUNTER — Other Ambulatory Visit: Payer: Self-pay

## 2021-12-04 ENCOUNTER — Ambulatory Visit: Payer: Medicare PPO | Admitting: Physical Therapy

## 2021-12-04 DIAGNOSIS — M6281 Muscle weakness (generalized): Secondary | ICD-10-CM

## 2021-12-04 DIAGNOSIS — M21371 Foot drop, right foot: Secondary | ICD-10-CM

## 2021-12-04 DIAGNOSIS — R2681 Unsteadiness on feet: Secondary | ICD-10-CM

## 2021-12-04 DIAGNOSIS — M5416 Radiculopathy, lumbar region: Secondary | ICD-10-CM

## 2021-12-04 DIAGNOSIS — R2689 Other abnormalities of gait and mobility: Secondary | ICD-10-CM

## 2021-12-04 DIAGNOSIS — M21372 Foot drop, left foot: Secondary | ICD-10-CM

## 2021-12-04 NOTE — Patient Instructions (Signed)
? ? ? ?  Access Code: [email or web address] ?URL: [email or web address] ?Date: 12/04/2021 ?Prepared by: Glenetta Hew ? ?Exercises ?- Supine Figure 4 Piriformis Stretch  - 2 x daily - 7 x weekly - 3 reps - 30 sec hold ?- Seated Flexion Stretch with Swiss Ball  - 2 x daily - 7 x weekly - 3 reps - 30 sec hold ?- Seated Thoracic Flexion and Rotation with Swiss Ball  - 2 x daily - 7 x weekly - 3 reps - 30 sec hold ?- Seated Hamstring Stretch  - 2 x daily - 7 x weekly - 3 reps - 30 sec hold ?- Seated Hip Flexor Stretch  - 2 x daily - 7 x weekly - 3 reps - 30 sec hold ?- Gastroc Stretch on Step  - 2 x daily - 7 x weekly - 3 reps - 30 sec hold ?- Beginner Front Arm Support  - 1 x daily - 7 x weekly - 2 sets - 10 reps - 3 sec hold ?- Seated Isometric Hip Abduction with Resistance  - 1 x daily - 7 x weekly - 2 sets - 10 reps - 3 sec hold ?

## 2021-12-04 NOTE — Therapy (Addendum)
[name] Trujillo ?Outpatient Rehabilitation [facility] ?[place] ?[place], [place], [address] ?Phone: [phone]   Fax:  [phone] ? ?Physical Therapy Treatment ? ?Patient Details  ?Name: [name] Trujillo ?MRN: [id number] ?Date of Birth: [birth year] ?Referring Provider (PT): Dorena Dew, NP for [name], MD ? ? ?Encounter Date: 12/04/2021 ? ? PT End of Session - 12/04/21 1401   ? ? Visit Number 3   ? Number of Visits 20   ? Date for PT Re-Evaluation 02/14/22   ? Authorization Type [employer]   ? Authorization Time Period 11/22/21 - 02/14/22   ? Authorization - Visit Number 2   ? Authorization - Number of Visits 10   ? PT Start Time 1401   ? PT Stop Time [id number]   ? PT Time Calculation (min) 53 min   ? Activity Tolerance Patient tolerated treatment well   ? Behavior During Therapy [facility] for tasks assessed/performed   ? ?  ?  ? ?  ? ? ?Past Medical History:  ?Diagnosis Date  ? High cholesterol   ? Hypothyroidism   ? Neuropathy   ? ? ?Past Surgical History:  ?Procedure Laterality Date  ? APPENDECTOMY    ? 10-18 yo  ? BACK SURGERY    ? Age 81  ? CESAREAN SECTION    ? ECTOPIC PREGNANCY SURGERY    ? LAPAROSCOPIC HYSTERECTOMY    ? ? ?There were no vitals filed for this visit. ? ? Subjective Assessment - 12/04/21 1404   ? ? Subjective Pt not noting much change since the ESI. Leg is still very numb esp along instep.   ? Pertinent History 09/21/20 - ACDF C5-6-7   ? Diagnostic tests 06/12/21 - Lumbar MRI: Postoperative and degenerative changes of the lumbar spine as described, similar compared to July 28, 2020. At L2-L3, there is moderate spinal canal stenosis due to disc bulging, endplate osteophytic spurring, facet and ligamentum flavum hypertrophy. Milder spinal canal stenosis is seen at L3-L4. There is multilevel neural foraminal stenosis.   ? Patient Stated Goals "to walk with a more normal gait and feel steadier"   ? Currently in Pain? No/denies   ? ?  ?  ? ?   ? ? ? ? ? ? ? ? ? ? ? ? ? ? ? ? ? ? ? ? [place] Adult PT Treatment/Exercise - 12/04/21 1401   ? ?  ? Lumbar Exercises: Stretches  ? Passive Hamstring Stretch Right;Left;2 reps;30 seconds   ? Passive Hamstring Stretch Limitations seated hip hinge   ? Hip Flexor Stretch Right;Left;2 reps;30 seconds   ? Hip Flexor Stretch Limitations lunge position over edge of chair   ? Quadruped Mid Back Stretch 2 reps;30 seconds   ? Quadruped Mid Back Stretch Limitations quadruped and seated 3-way prayer stretch/child's pose - hiking pole used for UE support with seated versions   pt preferring the seated version  ? Figure 4 Stretch 2 reps;30 seconds;Seated;Supine;With overpressure   ? Figure 4 Stretch Limitations side stiting (pt reports this bothered her lateral lower leg regardless of attempted modification); better tolerance for hoolkying figure-4   ? Gastroc Stretch Right;Left;2 reps;30 seconds   ? Gastroc Stretch Limitations negative heel over edge of step   ?  ? Lumbar Exercises: Aerobic  ? Recumbent Bike L3 x 6 min   ?  ? Lumbar Exercises: Quadruped  ? Straight Leg Raise 10 reps;3 seconds   ? Straight Leg Raises Limitations cues to avoid twisting/elevating hip   ?  ?  Knee/Hip Exercises: Seated  ? Clamshell with TheraBand Red   alt single leg hip ABD/ER 10 x 3"  ? ?  ?  ? ?  ? ? ? ? ? ? ? ? ? ? PT Education - 12/04/21 1450   ? ? Education Details Initial HEP - Access Code: [email or web address]   ? Person(s) Educated Patient   ? Methods Explanation;Demonstration;Verbal cues;Handout;Tactile cues   ? Comprehension Verbalized understanding;Verbal cues required;Tactile cues required;Returned demonstration;Need further instruction   ? ?  ?  ? ?  ? ? ? PT Short Term Goals - 11/27/21 1238   ? ?  ? PT SHORT TERM GOAL #1  ? Title Patient will be independent with initial HEP   ? Status On-going     ? Target Date 01/03/22   ?  ? PT SHORT TERM GOAL #2  ? Title Complete balance assessment an update LTGs as indicated   ? Status Achieved  11/27/21  ?  Target Date 12/20/21   ? ?  ?  ? ?  ? ? ? ? PT Long Term Goals - 12/04/21 1406   ? ?  ? PT LONG TERM GOAL #1  ? Title Patient will be independent with ongoing/advanced HEP for self-management at home   ? Status On-going   ? Target Date 02/14/22   ?  ? PT LONG TERM GOAL #2  ? Title Patient will demonstrate improved B hip strength to >/= 4/5 and R ankle strength to >/= 3+/5 to 4-/5  for improved stability and ease of mobility   ? Status On-going   ? Target Date 02/14/22   ?  ? PT LONG TERM GOAL #3  ? Title Patient will ambulate with improved gait pattern with decreased B foot drop/slap and no evidence of instability/LOB   ? Status On-going   ? Target Date 02/14/22   ?  ? PT LONG TERM GOAL #4  ? Title Patient will report improved comfort with stair negotiation for safe community access   ? Status On-going   ? Target Date 02/14/22   ?  ? PT LONG TERM GOAL #5  ? Title Patient will report improved feeling of stability with working in her garden   ? Status On-going   ? Target Date 02/14/22   ?  ? PT LONG TERM GOAL #6  ? Title Patient will improve FGA score to >/= 21/30 to improve gait stability and reduce risk for falls   ? Baseline 17/30 (11/27/21)   ? Status On-going   ? Target Date 02/14/22   ? ?  ?  ? ?  ? ? ? ? ? ? ? ? Plan - 12/04/21 1454   ? ? Clinical Impression Statement Mackensi reports she would prefer exercises and stretches from a sitting position where appropriate as she feels that sometimes the supine exercises bother her neck. Reviewed stretches from prior HEP and identified seated alternatives for most of the supine stretches, leaving only the piriformis stretch in supine/hooklying per pt preference. Initiated some lumbopelvic/hip strengthening, again avoiding supine per pt request, targeting areas of greatest weakness identified on eval. Will proceed with further strengthening progression as well as balance exercises next visit.   ? Comorbidities C5-7 ACDF 09/21/20; idiopathic progressive neuropathy;  hypthyroidism; DDD; remote h/o back surgery at age 31; h/o scoliosis   ? Rehab Potential Good   ? PT Frequency 2x / week   ? PT Duration 12 weeks   8-12 weeks - due to scheduling availability cert  window will extend for 12 weeks  ? PT Treatment/Interventions ADLs/Self Care Home Management;Cryotherapy;Electrical Stimulation;Moist Heat;Ultrasound;DME Instruction;Gait training;Stair training;Functional mobility training;Therapeutic activities;Therapeutic exercise;Balance training;Neuromuscular re-education;Patient/family education;Manual techniques;Passive range of motion;Dry needling;Energy conservation;Taping;Vestibular;Spinal Manipulations;Joint Manipulations   ? PT Next Visit Plan further review/update of HEP including core/lumbopelvic strengthening & balance activities   ? PT Home Exercise Plan Access Code: [email or web address]   ? Consulted and Agree with Plan of Care Patient   ? ?  ?  ? ?  ? ? ?Patient will benefit from skilled therapeutic intervention in order to improve the following deficits and impairments:  Abnormal gait, Decreased activity tolerance, Decreased balance, Decreased coordination, Decreased endurance, Decreased knowledge of use of DME, Decreased mobility, Decreased range of motion, Decreased safety awareness, Decreased strength, Difficulty walking, Increased fascial restricitons, Increased muscle spasms, Impaired perceived functional ability, Impaired flexibility, Impaired sensation, Dizziness, Impaired tone, Improper body mechanics, Postural dysfunction ? ?Visit Diagnosis: ?Radiculopathy, lumbar region ? ?Muscle weakness (generalized) ? ?Foot drop, left ? ?Foot drop, right ? ?Other abnormalities of gait and mobility ? ?Unsteadiness on feet ? ? ? ? ?Problem List ?Patient Active Problem List  ? Diagnosis Date Noted  ? 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  ? Ventricular premature beats 10/31/2015  ? Heart murmur 10/31/2015  ? ? ?Percival Spanish, PT ?12/04/2021, 3:13 PM ? ?[place] ?Outpatient  [facility]habilitation [facility] ?[place] ?[place], [place], [address] ?Phone: [phone]   Fax:  [phone] ? ?Name: Batool Reasonover ?MRN: [id number] ?Date of Birth: 09-20-1940 ? ? ? ?

## 2021-12-17 ENCOUNTER — Ambulatory Visit: Payer: Medicare PPO | Admitting: Physical Therapy

## 2021-12-19 ENCOUNTER — Encounter: Payer: Medicare PPO | Admitting: Physical Therapy

## 2021-12-25 ENCOUNTER — Encounter: Payer: Self-pay | Admitting: Physical Therapy

## 2021-12-25 ENCOUNTER — Ambulatory Visit: Payer: Medicare PPO | Attending: Registered" | Admitting: Physical Therapy

## 2021-12-25 DIAGNOSIS — M21371 Foot drop, right foot: Secondary | ICD-10-CM | POA: Diagnosis present

## 2021-12-25 DIAGNOSIS — R2681 Unsteadiness on feet: Secondary | ICD-10-CM | POA: Diagnosis present

## 2021-12-25 DIAGNOSIS — M21372 Foot drop, left foot: Secondary | ICD-10-CM | POA: Diagnosis present

## 2021-12-25 DIAGNOSIS — M6281 Muscle weakness (generalized): Secondary | ICD-10-CM | POA: Insufficient documentation

## 2021-12-25 DIAGNOSIS — M5416 Radiculopathy, lumbar region: Secondary | ICD-10-CM | POA: Diagnosis present

## 2021-12-25 DIAGNOSIS — R2689 Other abnormalities of gait and mobility: Secondary | ICD-10-CM | POA: Insufficient documentation

## 2021-12-25 NOTE — Patient Instructions (Signed)
? ? ?  Access Code: [email or web address] ?URL: [email or web address] ?Date: 12/25/2021 ?Prepared by: Annie Paras ? ?Exercises ?- Supine Figure 4 Piriformis Stretch  - 2 x daily - 7 x weekly - 3 reps - 30 sec hold ?- Seated Flexion Stretch with Swiss Ball  - 2 x daily - 7 x weekly - 3 reps - 30 sec hold ?- Seated Thoracic Flexion and Rotation with Swiss Ball  - 2 x daily - 7 x weekly - 3 reps - 30 sec hold ?- Seated Hamstring Stretch  - 2 x daily - 7 x weekly - 3 reps - 30 sec hold ?- Seated Hip Flexor Stretch  - 2 x daily - 7 x weekly - 3 reps - 30 sec hold ?- Gastroc Stretch on Step  - 2 x daily - 7 x weekly - 3 reps - 30 sec hold ?- Beginner Front Arm Support  - 1 x daily - 7 x weekly - 2 sets - 10 reps - 3 sec hold ?- Seated Isometric Hip Abduction with Resistance  - 1 x daily - 7 x weekly - 2 sets - 10 reps - 3 sec hold ?- Seated Toe Towel Scrunches  - 1 x daily - 7 x weekly - 2 sets - 10 reps - 3 sec hold ?- Toe Yoga - Alternating Great Toe and Lesser Toe Extension  - 1 x daily - 7 x weekly - 2 sets - 10 reps - 3 sec hold ?- Toe Flexion with Resistance  - 1 x daily - 7 x weekly - 2 sets - 10 reps - 3 sec hold ? ?

## 2021-12-25 NOTE — Therapy (Addendum)
[place] ?[facility] ?[address]  [address] ?[place], [place], [address] ?Phone: [phone]   Fax:  [phone] ? ?Physical Therapy Treatment ? ?Patient Details  ?Name: [name] Trujillo ?MRN: [id number] ?Date of Birth: 01-Jul-1941 ?Referring Provider (PT): Rondel Oh, NP for [name], MD ? ? ?Encounter Date: 12/25/2021 ? ? PT End of Session - 12/25/21 1315   ? ? Visit Number 4   ? Number of Visits 20   ? Date for PT Re-Evaluation 02/14/22   ? Authorization Type [employer]   ? Authorization Time Period 11/22/21 - 02/14/22   ? Authorization - Visit Number 3   ? Authorization - Number of Visits 10   ? PT Start Time 1315   ? PT Stop Time 1353   ? PT Time Calculation (min) 38 min   ? Activity Tolerance Patient tolerated treatment well   ? Behavior During Therapy [facility] for tasks assessed/performed   ? ?  ?  ? ?  ? ? ?Past Medical History:  ?Diagnosis Date  ? High cholesterol   ? Hypothyroidism   ? Neuropathy   ? ? ?Past Surgical History:  ?Procedure Laterality Date  ? APPENDECTOMY    ? 48-18 yo  ? BACK SURGERY    ? Age 59  ? CESAREAN SECTION    ? ECTOPIC PREGNANCY SURGERY    ? LAPAROSCOPIC HYSTERECTOMY    ? ? ?There were no vitals filed for this visit. ? ? Subjective Assessment - 12/25/21 1319   ? ? Subjective Pt reports worsening control of feet and ankles with increasing difficulty completing ankle circles and picking up marbles with her toes. Also notes increasing B knee discomfort, esp aching at night.   ? Pertinent History 09/21/20 - ACDF C5-6-7   ? Diagnostic tests 06/12/21 - Lumbar MRI: Postoperative and degenerative changes of the lumbar spine as described, similar compared to July 28, 2020. At L2-L3, there is moderate spinal canal stenosis due to disc bulging, endplate osteophytic spurring, facet and ligamentum flavum hypertrophy. Milder spinal canal stenosis is seen at L3-L4. There is multilevel neural foraminal stenosis.   ? Patient Stated Goals "to walk with  a more normal gait and feel steadier"   ? Currently in Pain? No/denies   ? ?  ?  ? ?  ? ? ? ? ? ? ? ? ? ? ? ? ? ? ? ? ? ? ? ? [facility] Adult PT Treatment/Exercise - 12/25/21 1315   ? ?  ? Lumbar Exercises: Aerobic  ? Recumbent Bike L4-3 x 6 min   ?  ? Ankle Exercises: Seated  ? Ankle Circles/Pumps Right;Left;10 reps   ? Ankle Circles/Pumps Limitations circles   ? Towel Crunch Limitations R/L x 10 reps   ? Other Seated Ankle Exercises R/L yellow TB DF x 10   ?  ? Ankle Exercises: Supine  ? T-Band R longsitting yellow TB toe curls   ? Other Supine Ankle Exercises L longsitting toe curls w/o resistance using strap to maintain neutral ankle x 10   ? ?  ?  ? ?  ? ? ? ? ? ? ? ? ? ? PT Education - 12/25/21 1352   ? ? Education Details HEP update - review and update of foot/toe and ankle exercises   ? Person(s) Educated Patient   ? Methods Explanation;Demonstration;Verbal cues;Tactile cues;Handout   ? Comprehension Verbalized understanding;Verbal cues required;Tactile cues required;Returned demonstration;Need further instruction   ? ?  ?  ? ?  ? ? ?  PT Short Term Goals - 12/25/21 1353   ? ?  ? PT SHORT TERM GOAL #1  ? Title Patient will be independent with initial HEP   ? Status On-going     ? Target Date 01/03/22   ?  ? PT SHORT TERM GOAL #2  ? Title Complete balance assessment an update LTGs as indicated   ? Status Achieved   11/27/21  ? ?  ?  ? ?  ? ? ? ? PT Long Term Goals - 12/04/21 1406   ? ?  ? PT LONG TERM GOAL #1  ? Title Patient will be independent with ongoing/advanced HEP for self-management at home   ? Status On-going   ? Target Date 02/14/22   ?  ? PT LONG TERM GOAL #2  ? Title Patient will demonstrate improved B hip strength to >/= 4/5 and R ankle strength to >/= 3+/5 to 4-/5  for improved stability and ease of mobility   ? Status On-going   ? Target Date 02/14/22   ?  ? PT LONG TERM GOAL #3  ? Title Patient will ambulate with improved gait pattern with decreased B foot drop/slap and no evidence of instability/LOB    ? Status On-going   ? Target Date 02/14/22   ?  ? PT LONG TERM GOAL #4  ? Title Patient will report improved comfort with stair negotiation for safe community access   ? Status On-going   ? Target Date 02/14/22   ?  ? PT LONG TERM GOAL #5  ? Title Patient will report improved feeling of stability with working in her garden   ? Status On-going   ? Target Date 02/14/22   ?  ? PT LONG TERM GOAL #6  ? Title Patient will improve FGA score to >/= 21/30 to improve gait stability and reduce risk for falls   ? Baseline 17/30 (11/27/21)   ? Status On-going   ? Target Date 02/14/22   ? ?  ?  ? ?  ? ? ? ? ? ? ? ? Plan - 12/25/21 1353   ? ? Clinical Impression Statement [name] Trujillo reports worsening of her movement and control in B ankles over the past 1-2 weeks, noting increased difficulty completing ankle circles and picking up marbles with her from her prior HEPs, although she has not yet noted any impact on her gait. Gait assessed with L>R foot drop/slap still evident although not notably more pronounced than before. She is still completing the ankle stretches but notes more difficulty with DF AROM, L>R, and feels that her R great toe wants to remain extended rather than curling when curling the rest of her toes as with marble pickup. Reviewed exercises to isolate ankle DF as well as toe flexion with and w/o yellow TB, and toe flexion/extension dissociation with toe yoga. Pt requesting to finish session early as her husband is waiting for her and they need to get home to finish their taxes.   ? Comorbidities C5-7 ACDF 09/21/20; idiopathic progressive neuropathy; hypthyroidism; DDD; remote h/o back surgery at age 52; h/o scoliosis   ? Rehab Potential Good   ? PT Frequency 2x / week   ? PT Duration 12 weeks   8-12 weeks - due to scheduling availability, cert window will extend for 12 weeks  ? PT Treatment/Interventions ADLs/Self Care Home Management;Cryotherapy;Electrical Stimulation;Moist Heat;Ultrasound;DME Instruction;Gait  training;Stair training;Functional mobility training;Therapeutic activities;Therapeutic exercise;Balance training;Neuromuscular re-education;Patient/family education;Manual techniques;Passive range of motion;Dry needling;Energy conservation;Taping;Vestibular;Spinal Manipulations;Joint Manipulations   ?  PT Next Visit Plan further review/update of HEP including core/lumbopelvic strengthening & balance activities   ? PT Home Exercise Plan Access Code: [email or web address]   ? Consulted and Agree with Plan of Care Patient   ? ?  ?  ? ?  ? ? ?Patient will benefit from skilled therapeutic intervention in order to improve the following deficits and impairments:  Abnormal gait, Decreased activity tolerance, Decreased balance, Decreased coordination, Decreased endurance, Decreased knowledge of use of DME, Decreased mobility, Decreased range of motion, Decreased safety awareness, Decreased strength, Difficulty walking, Increased fascial restricitons, Increased muscle spasms, Impaired perceived functional ability, Impaired flexibility, Impaired sensation, Dizziness, Impaired tone, Improper body mechanics, Postural dysfunction ? ?Visit Diagnosis: ?Radiculopathy, lumbar region ? ?Muscle weakness (generalized) ? ?Foot drop, left ? ?Foot drop, right ? ?Other abnormalities of gait and mobility ? ?Unsteadiness on feet ? ? ? ? ?Problem List ?Patient Active Problem List  ? Diagnosis Date Noted  ? 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  ? Ventricular premature beats 10/31/2015  ? Heart murmur 10/31/2015  ? ? ?Marry GuanJoAnne M Chares Slaymaker, PT ?12/25/2021, 2:10 PM ? ?[place] ?[facility] ?[address]  [address] ?[place], [place], [address] ?Phone: [phone]   Fax:  [phone] ? ?Name: [name] QuintCaryol Trujillo ?MRN: [id number] ?Date of Birth: Mar 15, 1941 ? ? ? ?

## 2021-12-27 ENCOUNTER — Encounter: Payer: Self-pay | Admitting: Physical Therapy

## 2021-12-27 ENCOUNTER — Ambulatory Visit: Payer: Medicare PPO | Admitting: Physical Therapy

## 2021-12-27 DIAGNOSIS — M5416 Radiculopathy, lumbar region: Secondary | ICD-10-CM | POA: Diagnosis not present

## 2021-12-27 DIAGNOSIS — M6281 Muscle weakness (generalized): Secondary | ICD-10-CM

## 2021-12-27 DIAGNOSIS — R2689 Other abnormalities of gait and mobility: Secondary | ICD-10-CM

## 2021-12-27 DIAGNOSIS — R2681 Unsteadiness on feet: Secondary | ICD-10-CM

## 2021-12-27 DIAGNOSIS — M21371 Foot drop, right foot: Secondary | ICD-10-CM

## 2021-12-27 DIAGNOSIS — M21372 Foot drop, left foot: Secondary | ICD-10-CM

## 2021-12-27 NOTE — Therapy (Signed)
[place] ?[facility] ?[address]  [address] ?[place], [place], [address] ?Phone: [phone]   Fax:  [phone] ? ?Physical Therapy Treatment ? ?Patient Details  ?Name: [name] Trujillo ?MRN: [id number] ?Date of Birth: 1941-03-19 ?Referring Provider (PT): Rondel OhJessica Patel, NP for [name] BeathJohn Hamilton, MD ? ? ?Encounter Date: 12/27/2021 ? ? PT End of Session - 12/27/21 1108   ? ? Visit Number 5   ? Number of Visits 20   ? Date for PT Re-Evaluation 02/14/22   ? Authorization Type [employer]   ? Authorization Time Period 11/22/21 - 02/14/22   ? Authorization - Visit Number 4   ? Authorization - Number of Visits 10   ? PT Start Time 1108   ? PT Stop Time 1149   ? PT Time Calculation (min) 41 min   ? Activity Tolerance Patient tolerated treatment well   ? [facility] for tasks assessed/performed   ? ?  ?  ? ?  ? ? ?Past Medical History:  ?Diagnosis Date  ? High cholesterol   ? Hypothyroidism   ? Neuropathy   ? ? ?Past Surgical History:  ?Procedure Laterality Date  ? APPENDECTOMY    ? 2517-18 yo  ? BACK SURGERY    ? Age 81  ? CESAREAN SECTION    ? ECTOPIC PREGNANCY SURGERY    ? LAPAROSCOPIC HYSTERECTOMY    ? ? ?There were no vitals filed for this visit. ? ? Subjective Assessment - 12/27/21 1114   ? ? Subjective Pt reports she is not typically as steady in the morning.   ? Pertinent History 09/21/20 - ACDF C5-6-7   ? Diagnostic tests 06/12/21 - Lumbar MRI: Postoperative and degenerative changes of the lumbar spine as described, similar compared to July 28, 2020. At L2-L3, there is moderate spinal canal stenosis due to disc bulging, endplate osteophytic spurring, facet and ligamentum flavum hypertrophy. Milder spinal canal stenosis is seen at L3-L4. There is multilevel neural foraminal stenosis.   ? Patient Stated Goals "to walk with a more normal gait and feel steadier"   ? ?  ?  ? ?  ? ? ? ? ? ? ? ? ? ? ? ? ? ? ? ? ? ? ? ? [facility] Adult PT Treatment/Exercise - 12/27/21 1108    ? ?  ? Ambulation/Gait  ? Pre-Gait Activities R/L fwd/back weightshifting step-over 1/2 FR targeting heel strike with slow eccentric DF on fwd weight shift, then active DF prior to lifting foot back over 1/2 FR x 20 each leg   single UE support on counter  ?  ? Neuro Re-ed   ? Neuro Re-ed Details  B side-step over 1/2 FR  x 20 each leg, B UE support on counter; Alt R/L toe clears to 9" step x 20, single UE support on counter   ?  ? Lumbar Exercises: Aerobic  ? Recumbent Bike L4-3 x 6 min   ?  ? Ankle Exercises: Seated  ? Heel Raises Both;20 reps;2 seconds   ? Toe Raise 20 reps;2 seconds   ? ?  ?  ? ?  ? ? ? ? ? ? ? ? ? ? ? ? PT Short Term Goals - 12/25/21 1353   ? ?  ? PT SHORT TERM GOAL #1  ? Title Patient will be independent with initial HEP   ? Status On-going   08/07/21  ? Target Date 01/03/22   ?  ? PT SHORT TERM  GOAL #2  ? Title Complete balance assessment an update LTGs as indicated   ? Status Achieved   11/27/21  ? ?  ?  ? ?  ? ? ? ? PT Long Term Goals - 12/04/21 1406   ? ?  ? PT LONG TERM GOAL #1  ? Title Patient will be independent with ongoing/advanced HEP for self-management at home   ? Status On-going   ? Target Date 02/14/22   ?  ? PT LONG TERM GOAL #2  ? Title Patient will demonstrate improved B hip strength to >/= 4/5 and R ankle strength to >/= 3+/5 to 4-/5  for improved stability and ease of mobility   ? Status On-going   ? Target Date 02/14/22   ?  ? PT LONG TERM GOAL #3  ? Title Patient will ambulate with improved gait pattern with decreased B foot drop/slap and no evidence of instability/LOB   ? Status On-going   ? Target Date 02/14/22   ?  ? PT LONG TERM GOAL #4  ? Title Patient will report improved comfort with stair negotiation for safe community access   ? Status On-going   ? Target Date 02/14/22   ?  ? PT LONG TERM GOAL #5  ? Title Patient will report improved feeling of stability with working in her garden   ? Status On-going   ? Target Date 02/14/22   ?  ? PT LONG TERM GOAL #6  ? Title  Patient will improve FGA score to >/= 21/30 to improve gait stability and reduce risk for falls   ? Baseline 17/30 (11/27/21)   ? Status On-going   ? Target Date 02/14/22   ? ?  ?  ? ?  ? ? ? ? ? ? ? ? Plan - 12/27/21 1149   ? ? Clinical Impression Statement [name] Trujillo reports no concerns or need for further review following the overview of the ankle exercises last visit. Worked on standing balance and pre-gait activities targeting heel strike and eccentric DF control to reduce foot slap and promote more normal step pattern with gait today using ? FR to encourage hip and knee flexion for improved foot clearance, as well as lateral hip strength/stability. Good tolerance with pt noting feeling of motor control becoming more natural with repetition.   ? Comorbidities C5-7 ACDF 09/21/20; idiopathic progressive neuropathy; hypthyroidism; DDD; remote h/o back surgery at age 23; h/o scoliosis   ? Rehab Potential Good   ? PT Frequency 2x / week   ? PT Duration 12 weeks   8-12 weeks - due to scheduling availability, cert window will extend for 12 weeks  ? PT Treatment/Interventions ADLs/Self Care Home Management;Cryotherapy;Electrical Stimulation;Moist Heat;Ultrasound;DME Instruction;Gait training;Stair training;Functional mobility training;Therapeutic activities;Therapeutic exercise;Balance training;Neuromuscular re-education;Patient/family education;Manual techniques;Passive range of motion;Dry needling;Energy conservation;Taping;Vestibular;Spinal Manipulations;Joint Manipulations   ? PT Next Visit Plan further review/update of HEP including core/lumbopelvic strengthening & balance activities   ? PT Home Exercise Plan Access Code: [email or web address]   ? Consulted and Agree with Plan of Care Patient   ? ?  ?  ? ?  ? ? ?Patient will benefit from skilled therapeutic intervention in order to improve the following deficits and impairments:  Abnormal gait, Decreased activity tolerance, Decreased balance, Decreased coordination, Decreased  endurance, Decreased knowledge of use of DME, Decreased mobility, Decreased range of motion, Decreased safety awareness, Decreased strength, Difficulty walking, Increased fascial restricitons, Increased muscle spasms, Impaired perceived functional ability, Impaired flexibility, Impaired sensation, Dizziness, Impaired tone, Improper body mechanics,  Postural dysfunction ? ?Visit Diagnosis: ?Radiculopathy, lumbar region ? ?Muscle weakness (generalized) ? ?Foot drop, left ? ?Foot drop, right ? ?Other abnormalities of gait and mobility ? ?Unsteadiness on feet ? ? ? ? ?Problem List ?Patient Active Problem List  ? Diagnosis Date Noted  ? 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  ? Ventricular premature beats 10/31/2015  ? Heart murmur 10/31/2015  ? ? ?Marry Guan, PT ?12/27/2021, 11:55 AM ? ?[place] ?[facility] ?[address]  [address] ?[place], [place], [address] ?Phone: [phone]   Fax:  [phone] ? ?Name: [name] Trujillo ?MRN: [id number] ?Date of Birth: March 01, 1941 ? ? ? ?

## 2021-12-31 ENCOUNTER — Encounter: Payer: Medicare PPO | Admitting: Physical Therapy

## 2022-01-07 ENCOUNTER — Inpatient Hospital Stay (HOSPITAL_COMMUNITY)
Admission: EM | Admit: 2022-01-07 | Discharge: 2022-01-10 | DRG: 287 | Disposition: A | Discharge status: Home / Self Care | Payer: Medicare PPO | Attending: Internal Medicine | Admitting: Internal Medicine

## 2022-01-07 ENCOUNTER — Emergency Department (HOSPITAL_COMMUNITY): Payer: Medicare PPO

## 2022-01-07 DIAGNOSIS — I214 Non-ST elevation (NSTEMI) myocardial infarction: Principal | ICD-10-CM

## 2022-01-07 DIAGNOSIS — Z885 Allergy status to narcotic agent status: Secondary | ICD-10-CM

## 2022-01-07 DIAGNOSIS — R000 Tachycardia, unspecified: Secondary | ICD-10-CM | POA: Diagnosis present

## 2022-01-07 DIAGNOSIS — Z8249 Family history of ischemic heart disease and other diseases of the circulatory system: Secondary | ICD-10-CM

## 2022-01-07 DIAGNOSIS — R778 Other specified abnormalities of plasma proteins: Secondary | ICD-10-CM

## 2022-01-07 DIAGNOSIS — I420 Dilated cardiomyopathy: Secondary | ICD-10-CM | POA: Diagnosis present

## 2022-01-07 DIAGNOSIS — W010XXA Fall on same level from slipping, tripping and stumbling without subsequent striking against object, initial encounter: Secondary | ICD-10-CM | POA: Diagnosis present

## 2022-01-07 DIAGNOSIS — I5181 Takotsubo syndrome: Secondary | ICD-10-CM | POA: Diagnosis not present

## 2022-01-07 DIAGNOSIS — Z981 Arthrodesis status: Secondary | ICD-10-CM

## 2022-01-07 DIAGNOSIS — Z888 Allergy status to other drugs, medicaments and biological substances status: Secondary | ICD-10-CM

## 2022-01-07 DIAGNOSIS — Z811 Family history of alcohol abuse and dependence: Secondary | ICD-10-CM

## 2022-01-07 DIAGNOSIS — Z823 Family history of stroke: Secondary | ICD-10-CM

## 2022-01-07 DIAGNOSIS — E785 Hyperlipidemia, unspecified: Secondary | ICD-10-CM

## 2022-01-07 DIAGNOSIS — R7989 Other specified abnormal findings of blood chemistry: Secondary | ICD-10-CM

## 2022-01-07 DIAGNOSIS — E7800 Pure hypercholesterolemia, unspecified: Secondary | ICD-10-CM | POA: Diagnosis present

## 2022-01-07 DIAGNOSIS — Z79899 Other long term (current) drug therapy: Secondary | ICD-10-CM

## 2022-01-07 DIAGNOSIS — E039 Hypothyroidism, unspecified: Secondary | ICD-10-CM

## 2022-01-07 DIAGNOSIS — G629 Polyneuropathy, unspecified: Secondary | ICD-10-CM | POA: Diagnosis present

## 2022-01-07 DIAGNOSIS — R1110 Vomiting, unspecified: Secondary | ICD-10-CM | POA: Diagnosis present

## 2022-01-07 DIAGNOSIS — Z79890 Hormone replacement therapy (postmenopausal): Secondary | ICD-10-CM

## 2022-01-07 LAB — COMPREHENSIVE METABOLIC PANEL
ALT: 13 U/L (ref 0–44)
AST: 14 U/L — ABNORMAL LOW (ref 15–41)
Albumin: 4 g/dL (ref 3.5–5.0)
Alkaline Phosphatase: 67 U/L (ref 38–126)
Anion gap: 10 (ref 5–15)
BUN: 18 mg/dL (ref 8–23)
CO2: 23 mmol/L (ref 22–32)
Calcium: 9.4 mg/dL (ref 8.9–10.3)
Chloride: 105 mmol/L (ref 98–111)
Creatinine, Ser: 0.83 mg/dL (ref 0.44–1.00)
GFR, Estimated: >60 mL/min (ref >60)
Glucose, Bld: 159 mg/dL — ABNORMAL HIGH (ref 70–99)
Potassium: 3.5 mmol/L (ref 3.5–5.1)
Sodium: 138 mmol/L (ref 135–145)
Total Bilirubin: 0.5 mg/dL (ref 0.3–1.2)
Total Protein: 6.7 g/dL (ref 6.5–8.1)

## 2022-01-07 LAB — CBC WITH DIFFERENTIAL/PLATELET
Abs Immature Granulocytes: 0.04 10*3/uL (ref 0.00–0.07)
Basophils Absolute: 0.1 10*3/uL (ref 0.0–0.1)
Basophils Relative: 1 %
Eosinophils Absolute: 0 10*3/uL (ref 0.0–0.5)
Eosinophils Relative: 0 %
HCT: 41.1 % (ref 36.0–46.0)
Hemoglobin: 13.7 g/dL (ref 12.0–15.0)
Immature Granulocytes: 0 %
Lymphocytes Relative: 14 %
Lymphs Abs: 1.4 10*3/uL (ref 0.7–4.0)
MCH: 32.2 pg (ref 26.0–34.0)
MCHC: 33.3 g/dL (ref 30.0–36.0)
MCV: 96.7 fL (ref 80.0–100.0)
Monocytes Absolute: 0.5 10*3/uL (ref 0.1–1.0)
Monocytes Relative: 5 %
Neutro Abs: 8.2 10*3/uL — ABNORMAL HIGH (ref 1.7–7.7)
Neutrophils Relative %: 80 %
Platelets: 245 10*3/uL (ref 150–400)
RBC: 4.25 MIL/uL (ref 3.87–5.11)
RDW: 12.1 % (ref 11.5–15.5)
WBC: 10.2 10*3/uL (ref 4.0–10.5)
nRBC: 0 % (ref 0.0–0.2)

## 2022-01-07 LAB — LIPASE, BLOOD: Lipase: 29 U/L (ref 11–51)

## 2022-01-07 LAB — TROPONIN I (HIGH SENSITIVITY): Troponin I (High Sensitivity): 523 ng/L (ref <18)

## 2022-01-07 MED ORDER — ONDANSETRON 4 MG PO TBDP
4.0000 mg | ORAL_TABLET | Freq: Once | ORAL | Status: AC
  Administered 2022-01-07: 4 mg via ORAL
  Filled 2022-01-07: qty 1

## 2022-01-07 NOTE — ED Provider Triage Note (Signed)
Emergency Medicine Provider Triage Evaluation Note ? ?[name] Trujillo , a 81 y.o. female  was evaluated in triage.  Pt complains of N/V & chest pain that began shortly PTA.  Patient states that her husband had a fall this evening and is on blood thinners, he needed to come to the emergency department for further evaluation.  She states that shortly after this she became nauseated and developed chest pain.  She reports she had 4 episodes of vomiting as well as two fairly normal bowel movements.  She has had fairly constant chest pain since, central, nonradiating, tight in nature.  No alleviating or aggravating factors to her symptoms. ? ?Review of Systems  ?Positive: Chest pain, nausea, vomiting ?Negative: Dyspnea, diaphoresis, diarrhea, melena, hematochezia, or hematemesis. ? ?Physical Exam  ?BP 131/75   Pulse 87   Temp 98 ?F (36.7 ?C) (Oral)   Resp 14   SpO2 99%  ?Gen:   Awake, no distress   ?Resp:  Normal effort  ?MSK:   Moves extremities without difficulty  ?Other:  Heart regular rate and rhythm.  Breath sounds present bilaterally.  Mild epigastric tenderness.  No peritoneal signs.  2+ symmetric radial pulses. ? ?Medical Decision Making  ?Medically screening exam initiated at 10:24 PM.  Appropriate orders placed.  [name] was informed that the remainder of the evaluation will be completed by another provider, this initial triage assessment does not replace that evaluation, and the importance of remaining in the ED until their evaluation is complete. ? ?Chest pain ?  ?Cherly Anderson, PA-C ?01/07/22 2232 ? ?

## 2022-01-07 NOTE — ED Triage Notes (Signed)
Pt w CP/nausea after husband suffered fall earlier today. Husband came in as level 2 trauma fall on eliquis. Pt w 3-4 episodes emesis, no blood noted. Pt advised "bowel trouble," no diarrhea.  ?

## 2022-01-08 ENCOUNTER — Other Ambulatory Visit: Payer: Self-pay

## 2022-01-08 ENCOUNTER — Ambulatory Visit: Payer: Medicare PPO | Admitting: Physical Therapy

## 2022-01-08 ENCOUNTER — Inpatient Hospital Stay (HOSPITAL_COMMUNITY): Payer: Medicare PPO

## 2022-01-08 ENCOUNTER — Encounter (HOSPITAL_COMMUNITY): Payer: Self-pay | Admitting: Internal Medicine

## 2022-01-08 ENCOUNTER — Emergency Department (HOSPITAL_COMMUNITY): Payer: Medicare PPO

## 2022-01-08 DIAGNOSIS — I5181 Takotsubo syndrome: Secondary | ICD-10-CM | POA: Diagnosis present

## 2022-01-08 DIAGNOSIS — R778 Other specified abnormalities of plasma proteins: Secondary | ICD-10-CM | POA: Diagnosis not present

## 2022-01-08 DIAGNOSIS — Z981 Arthrodesis status: Secondary | ICD-10-CM | POA: Diagnosis not present

## 2022-01-08 DIAGNOSIS — I420 Dilated cardiomyopathy: Secondary | ICD-10-CM | POA: Diagnosis present

## 2022-01-08 DIAGNOSIS — I10 Essential (primary) hypertension: Secondary | ICD-10-CM

## 2022-01-08 DIAGNOSIS — W010XXA Fall on same level from slipping, tripping and stumbling without subsequent striking against object, initial encounter: Secondary | ICD-10-CM | POA: Diagnosis present

## 2022-01-08 DIAGNOSIS — E7800 Pure hypercholesterolemia, unspecified: Secondary | ICD-10-CM | POA: Diagnosis present

## 2022-01-08 DIAGNOSIS — R000 Tachycardia, unspecified: Secondary | ICD-10-CM | POA: Diagnosis present

## 2022-01-08 DIAGNOSIS — R1110 Vomiting, unspecified: Secondary | ICD-10-CM | POA: Diagnosis present

## 2022-01-08 DIAGNOSIS — G629 Polyneuropathy, unspecified: Secondary | ICD-10-CM | POA: Diagnosis present

## 2022-01-08 DIAGNOSIS — I214 Non-ST elevation (NSTEMI) myocardial infarction: Secondary | ICD-10-CM

## 2022-01-08 DIAGNOSIS — Z8249 Family history of ischemic heart disease and other diseases of the circulatory system: Secondary | ICD-10-CM | POA: Diagnosis not present

## 2022-01-08 DIAGNOSIS — Z79899 Other long term (current) drug therapy: Secondary | ICD-10-CM | POA: Diagnosis not present

## 2022-01-08 DIAGNOSIS — I21A1 Myocardial infarction type 2: Secondary | ICD-10-CM | POA: Insufficient documentation

## 2022-01-08 DIAGNOSIS — Z888 Allergy status to other drugs, medicaments and biological substances status: Secondary | ICD-10-CM | POA: Diagnosis not present

## 2022-01-08 DIAGNOSIS — I248 Other forms of acute ischemic heart disease: Secondary | ICD-10-CM | POA: Diagnosis not present

## 2022-01-08 DIAGNOSIS — E039 Hypothyroidism, unspecified: Secondary | ICD-10-CM | POA: Diagnosis present

## 2022-01-08 DIAGNOSIS — Z811 Family history of alcohol abuse and dependence: Secondary | ICD-10-CM | POA: Diagnosis not present

## 2022-01-08 DIAGNOSIS — Z885 Allergy status to narcotic agent status: Secondary | ICD-10-CM | POA: Diagnosis not present

## 2022-01-08 DIAGNOSIS — Z823 Family history of stroke: Secondary | ICD-10-CM | POA: Diagnosis not present

## 2022-01-08 DIAGNOSIS — Z79890 Hormone replacement therapy (postmenopausal): Secondary | ICD-10-CM | POA: Diagnosis not present

## 2022-01-08 DIAGNOSIS — E7849 Other hyperlipidemia: Secondary | ICD-10-CM | POA: Diagnosis not present

## 2022-01-08 HISTORY — PX: TRANSTHORACIC ECHOCARDIOGRAM: SHX275

## 2022-01-08 HISTORY — DX: Takotsubo syndrome: I51.81

## 2022-01-08 HISTORY — DX: Myocardial infarction type 2: I21.A1

## 2022-01-08 LAB — HEMOGLOBIN A1C
Hgb A1c MFr Bld: 5.3 % (ref 4.8–5.6)
Mean Plasma Glucose: 105.41 mg/dL

## 2022-01-08 LAB — CBC
HCT: 37.9 % (ref 36.0–46.0)
Hemoglobin: 13.3 g/dL (ref 12.0–15.0)
MCH: 33.3 pg (ref 26.0–34.0)
MCHC: 35.1 g/dL (ref 30.0–36.0)
MCV: 94.8 fL (ref 80.0–100.0)
Platelets: 261 10*3/uL (ref 150–400)
RBC: 4 MIL/uL (ref 3.87–5.11)
RDW: 12.2 % (ref 11.5–15.5)
WBC: 10.8 10*3/uL — ABNORMAL HIGH (ref 4.0–10.5)
nRBC: 0 % (ref 0.0–0.2)

## 2022-01-08 LAB — ECHOCARDIOGRAM COMPLETE
AR max vel: 2.3 cm2
AV Peak grad: 5.3 mmHg
Ao pk vel: 1.15 m/s
Area-P 1/2: 5.16 cm2
Calc EF: 28.4 %
Height: 61 in
S' Lateral: 2.8 cm
Single Plane A2C EF: 29.4 %
Single Plane A4C EF: 28.3 %
Weight: 1904 oz

## 2022-01-08 LAB — LIPID PANEL
Cholesterol: 144 mg/dL (ref 0–200)
HDL: 58 mg/dL (ref >40)
LDL Cholesterol: 81 mg/dL (ref 0–99)
Total CHOL/HDL Ratio: 2.5 RATIO
Triglycerides: 25 mg/dL (ref <150)
VLDL: 5 mg/dL (ref 0–40)

## 2022-01-08 LAB — HEPARIN LEVEL (UNFRACTIONATED)
Heparin Unfractionated: 0.27 IU/mL — ABNORMAL LOW (ref 0.30–0.70)
Heparin Unfractionated: 0.12 IU/mL — ABNORMAL LOW (ref 0.30–0.70)

## 2022-01-08 LAB — BRAIN NATRIURETIC PEPTIDE: B Natriuretic Peptide: 162.1 pg/mL — ABNORMAL HIGH (ref 0.0–100.0)

## 2022-01-08 LAB — TROPONIN I (HIGH SENSITIVITY): Troponin I (High Sensitivity): 1396 ng/L (ref <18)

## 2022-01-08 MED ORDER — ASPIRIN EC 81 MG PO TBEC
81.0000 mg | DELAYED_RELEASE_TABLET | Freq: Once | ORAL | Status: AC
  Administered 2022-01-09: 81 mg via ORAL
  Filled 2022-01-08: qty 1

## 2022-01-08 MED ORDER — LEVOTHYROXINE SODIUM 88 MCG PO TABS
88.0000 ug | ORAL_TABLET | Freq: Every day | ORAL | Status: DC
  Administered 2022-01-08 – 2022-01-10 (×3): 88 ug via ORAL
  Filled 2022-01-08 (×4): qty 1

## 2022-01-08 MED ORDER — ATORVASTATIN CALCIUM 80 MG PO TABS
80.0000 mg | ORAL_TABLET | Freq: Every day | ORAL | Status: DC
  Administered 2022-01-08: 80 mg via ORAL
  Filled 2022-01-08: qty 2

## 2022-01-08 MED ORDER — ASPIRIN EC 81 MG PO TBEC
81.0000 mg | DELAYED_RELEASE_TABLET | Freq: Every day | ORAL | Status: DC
  Administered 2022-01-10: 81 mg via ORAL
  Filled 2022-01-08: qty 1

## 2022-01-08 MED ORDER — NITROGLYCERIN 2 % TD OINT
0.5000 [in_us] | TOPICAL_OINTMENT | Freq: Once | TRANSDERMAL | Status: AC
Start: 2022-01-08 — End: 2022-01-08
  Administered 2022-01-08: 0.5 [in_us] via TOPICAL
  Filled 2022-01-08: qty 1

## 2022-01-08 MED ORDER — SODIUM CHLORIDE 0.9 % WEIGHT BASED INFUSION: 3.0000 mL/kg/h | INTRAVENOUS | Status: AC

## 2022-01-08 MED ORDER — HEPARIN (PORCINE) 25000 UT/250ML-% IV SOLN
600.0000 [IU]/h | INTRAVENOUS | Status: DC
  Administered 2022-01-08: 600 [IU]/h via INTRAVENOUS
  Filled 2022-01-08: qty 250

## 2022-01-08 MED ORDER — METOPROLOL TARTRATE 25 MG PO TABS
12.5000 mg | ORAL_TABLET | Freq: Two times a day (BID) | ORAL | Status: DC
  Administered 2022-01-08 (×2): 12.5 mg via ORAL
  Filled 2022-01-08 (×2): qty 1

## 2022-01-08 MED ORDER — SODIUM CHLORIDE 0.9 % IV SOLN: 250.0000 mL | INTRAVENOUS | Status: DC | PRN

## 2022-01-08 MED ORDER — SODIUM CHLORIDE 0.9% FLUSH
3.0000 mL | Freq: Two times a day (BID) | INTRAVENOUS | Status: DC
  Administered 2022-01-08 – 2022-01-09 (×2): 3 mL via INTRAVENOUS

## 2022-01-08 MED ORDER — IOHEXOL 350 MG/ML SOLN
100.0000 mL | Freq: Once | INTRAVENOUS | Status: AC | PRN
  Administered 2022-01-08: 100 mL via INTRAVENOUS

## 2022-01-08 MED ORDER — SODIUM CHLORIDE 0.9 % WEIGHT BASED INFUSION
1.0000 mL/kg/h | INTRAVENOUS | Status: DC
  Administered 2022-01-08: 1 mL/kg/h via INTRAVENOUS

## 2022-01-08 MED ORDER — ASPIRIN 325 MG PO TABS
325.0000 mg | ORAL_TABLET | Freq: Every day | ORAL | Status: DC
  Administered 2022-01-08: 325 mg via ORAL
  Filled 2022-01-08: qty 1

## 2022-01-08 MED ORDER — NITROGLYCERIN 0.4 MG SL SUBL: 0.4000 mg | SUBLINGUAL_TABLET | SUBLINGUAL | Status: DC | PRN

## 2022-01-08 MED ORDER — PERFLUTREN LIPID MICROSPHERE
1.0000 mL | INTRAVENOUS | Status: AC | PRN
  Administered 2022-01-08: 2 mL via INTRAVENOUS
  Filled 2022-01-08: qty 10

## 2022-01-08 MED ORDER — LACTATED RINGERS IV SOLN: INTRAVENOUS | Status: AC

## 2022-01-08 MED ORDER — HEPARIN BOLUS VIA INFUSION
3000.0000 [IU] | Freq: Once | INTRAVENOUS | Status: AC
Start: 2022-01-08 — End: 2022-01-08
  Administered 2022-01-08: 3000 [IU] via INTRAVENOUS
  Filled 2022-01-08: qty 3000

## 2022-01-08 MED ORDER — ACETAMINOPHEN 325 MG PO TABS
650.0000 mg | ORAL_TABLET | ORAL | Status: DC | PRN
  Administered 2022-01-10: 650 mg via ORAL
  Filled 2022-01-08: qty 2

## 2022-01-08 MED ORDER — HEPARIN (PORCINE) 25000 UT/250ML-% IV SOLN
750.0000 [IU]/h | INTRAVENOUS | Status: DC
  Administered 2022-01-08: 650 [IU]/h via INTRAVENOUS
  Filled 2022-01-08: qty 250

## 2022-01-08 MED ORDER — SODIUM CHLORIDE 0.9% FLUSH: 3.0000 mL | INTRAVENOUS | Status: DC | PRN

## 2022-01-08 MED ORDER — METOPROLOL SUCCINATE ER 25 MG PO TB24
25.0000 mg | ORAL_TABLET | Freq: Every day | ORAL | Status: DC
Start: 2022-01-09 — End: 2022-01-10
  Administered 2022-01-09 – 2022-01-10 (×2): 25 mg via ORAL
  Filled 2022-01-08 (×2): qty 1

## 2022-01-08 MED ORDER — ONDANSETRON HCL 4 MG/2ML IJ SOLN: 4.0000 mg | Freq: Four times a day (QID) | INTRAMUSCULAR | Status: DC | PRN

## 2022-01-08 NOTE — Progress Notes (Signed)
ANTICOAGULATION CONSULT NOTE - Initial Consult ? ?Pharmacy Consult for heparin ?Indication:  NSTEMI ? ?Allergies  ?Allergen Reactions  ? Oxycodone Hcl Nausea And Vomiting  ? Primidone   ? Propranolol   ? Tolterodine   ? Hydrocodone Nausea And Vomiting  ? ? ?Patient Measurements: ?Height: 5\' 1"  (154.9 cm) ?Weight: 54 kg (119 lb) ?IBW/kg (Calculated) : 47.8 ? ?Vital Signs: ?BP: 106/61 (05/02 1152) ?Pulse Rate: 80 (05/02 1152) ? ?Labs: ?Recent Labs  ?  01/07/22 ?2234 01/08/22 ?0105 01/08/22 ?0425 01/08/22 ?1230  ?HGB 13.7  --  13.3  --   ?HCT 41.1  --  37.9  --   ?PLT 245  --  261  --   ?HEPARINUNFRC  --   --   --  0.27*  ?CREATININE 0.83  --   --   --   ?TROPONINIHS 523* 1,396*  --   --   ? ? ?Estimated Creatinine Clearance: 42.2 mL/min (by C-G formula based on SCr of 0.83 mg/dL). ? ? ?Medical History: ?Past Medical History:  ?Diagnosis Date  ? High cholesterol   ? Hypothyroidism   ? Neuropathy   ? ? ?Assessment: ?81yo female c/o CP associated w/ nausea, troponin found to be elevated and rising in the setting of her husband falling . No anticoagulation prior to admission. Pharmacy consulted for heparin.   ? ?Heparin level 0.27 is just subtherapeutic on 600 units/hr. Plan for Cath 5/2.  No issues with infusion or bleeding per RN.   ? ? ?Goal of Therapy:  ?Heparin level 0.3-0.7 units/ml ?Monitor platelets by anticoagulation protocol: Yes ?  ?Plan:  ?Increase Heparin to 650 units/hr ?F/u 8hr HL  ?Monitor daily heparin level, CBC ?Monitor for signs/symptoms of bleeding  ?F/u post-cath plan  ? ?7/2, PharmD, BCPS, BCCP ?Clinical Pharmacist ? ?Please check AMION for all [facility] phone numbers ?After 10:00 PM, call Main Pharmacy [phone] ? ?

## 2022-01-08 NOTE — H&P (Signed)
?Cardiology Admission History and Physical:  ? ?Patient ID: [name] Trujillo ?MRN: [id number]; DOB: 1940-12-13  ? ?Admission date: 01/07/2022 ? ?PCP:  Mosetta PuttBlomgren, Peter, MD ?  ?[facility] Providers ?Cardiologist:  None   { ? ?Chief Complaint:  "My chest started to hurt and I got sick to my stomach" ? ?Patient Profile:  ? ?[name] Trujillo is a 81 y.o. female with hx of hyperlipidemia and hypothyroidism who is being seen 01/08/2022 for the evaluation of NSTEMI. ? ?History of Present Illness:  ? ?[name] Trujillo has no known cardiac history. She was referred for an echo in 2017 for a murmer, but had normal biventricular function and no significant valvular disease.  ? ?This evening, [name] Trujillo was at home while her husband was taking out the trash.  He slipped and fell on his way back up the driveway, and landed on his face.  Fortunately [name] Trujillo's neighbor was visiting them and went to check on her husband.  She brought him back up to the house and unfortunately was bleeding.  They decided to call 911 to [facility] her [facility].  Around this time she reports that she suddenly developed upper chest "tightness."  She had never experienced this sensation before.  This was associated with significant nausea.  She denied any shortness of breath, jaw pain, arm pain.  She got in the car with her neighbor and in route her chest pain remained unchanged.  Her nausea progressed and she vomited just outside the emergency department here.  When she came inside she went to the bathroom and vomited a few more times.  She was seen by one of the nursing staff and it was recommended that she be evaluated.  At baseline [name] Trujillo walks at least a half a mile per day with no chest pain or shortness of breath. ? ?Her initial EKG was sinus with no ST changes.  She was slightly tachycardic but otherwise hemodynamically stable.  Her initial troponin was highly elevated at 523 and then quickly rose to 1396.  She was loaded with  aspirin and started on a heparin drip.  She was given nitroglycerin in the emergency department which largely resolved her chest discomfort. ? ?Past Medical History:  ?Diagnosis Date  ? High cholesterol   ? Hypothyroidism   ? Neuropathy   ? ? ?Past Surgical History:  ?Procedure Laterality Date  ? APPENDECTOMY    ? 5217-18 yo  ? BACK SURGERY    ? Age 81  ? CESAREAN SECTION    ? ECTOPIC PREGNANCY SURGERY    ? LAPAROSCOPIC HYSTERECTOMY    ?  ? ?Medications Prior to Admission: ?Prior to Admission medications   ?Medication Sig Start Date End Date Taking? Authorizing Provider  ?B Complex-C (SUPER B COMPLEX PO) Take by mouth daily. ?Patient not taking: Reported on 11/22/2021    [provider]  ?levothyroxine (SYNTHROID, LEVOTHROID) 100 MCG tablet Take 88 mcg by mouth daily before breakfast.    [provider]  ?rosuvastatin (CRESTOR) 10 MG tablet rosuvastatin 10 mg tablet ? TAKE ONE TABLET BY MOUTH EVERY NIGHT AFTER MEAL(S)    [provider]  ?solifenacin (VESICARE) 5 MG tablet Take 10 mg by mouth daily.    [provider]  ?  ? ?Allergies:    ?Allergies  ?Allergen Reactions  ? Oxycodone Hcl Nausea And Vomiting  ? Primidone   ? Propranolol   ? Tolterodine   ? Hydrocodone Nausea And Vomiting  ? ? ?Social  History:   ?Social History  ? ?Socioeconomic History  ? Marital status: Married  ?  Spouse name: Not on file  ? Number of children: 7  ? Years of education: 81  ? Highest education level: Bachelor's degree (e.g., BA, AB, BS)  ?Occupational History  ? Occupation: Retired  ?Tobacco Use  ? Smoking status: Never  ? Smokeless tobacco: Never  ?Substance and Sexual Activity  ? Alcohol use: Yes  ?  Comment: once or twice a month  ? Drug use: No  ? Sexual activity: Not on file  ?Other Topics Concern  ? Not on file  ?Social History Narrative  ? Lives at home w/ her husband   ? Right-handed  ? Caffeine: 2 cups of coffee in the morning  ? ?Social Determinants of Health  ? ?Financial Resource Strain:  Not on file  ?Food Insecurity: Not on file  ?Transportation Needs: Not on file  ?Physical Activity: Not on file  ?Stress: Not on file  ?Social Connections: Not on file  ?Intimate Partner Violence: Not on file  ?  ?Family History:   ?The patient's family history includes Alcohol abuse in her father; Heart attack in her father; Stroke in her mother. There is no history of Neuropathy.   ? ?ROS:  ?Please see the history of present illness.  ?All other ROS reviewed and negative.    ? ?Physical Exam/Data:  ? ?Vitals:  ? 01/08/22 0154 01/08/22 0215 01/08/22 0245 01/08/22 0300  ?BP: 122/80 122/68 123/68 128/73  ?Pulse: (!) 103 (!) 101 (!) 106 (!) 108  ?Resp: 16 19 18    ?Temp:      ?TempSrc:      ?SpO2: 93% 91% 93% 91%  ? ?No intake or output data in the 24 hours ending 01/08/22 0324 ? ?  08/03/2018  ?  2:23 PM 11/26/2016  ?  1:27 PM  ?Last 3 Weights  ?Weight (lbs) 128 lb 126 lb 9.6 oz  ?Weight (kg) 58.06 kg 57.425 kg  ?   ?There is no height or weight on file to calculate BMI.  ?General:  Well nourished, well developed, in no acute distress ?HEENT: normal ?Neck: no JVD ?Vascular: No carotid bruits; Distal pulses 2+ bilaterally   ?Cardiac:  normal S1, S2; Tachycardic, regular rhythm; no murmur  ?Lungs:  clear to auscultation bilaterally, no wheezing, rhonchi or rales  ?Abd: soft, nontender, no hepatomegaly  ?Ext: no edema ?Musculoskeletal:  No deformities, BUE and BLE strength normal and equal ?Skin: warm and dry  ?Neuro:  CNs 2-12 intact, no focal abnormalities noted ?Psych:  Normal affect  ? ?EKG:  The ECG that was done 01/07/22 was personally reviewed and demonstrates NSR ? ?Relevant CV Studies: ?TTE (11/2015): ?- Left ventricle: The cavity size was normal. Systolic function was  ?  normal. The estimated ejection fraction was in the range of 60%  ?  to 65%. Wall motion was normal; there were no regional wall  ?  motion abnormalities. Doppler parameters are consistent with  ?  abnormal left ventricular relaxation (grade 1  diastolic  ?  dysfunction). Doppler parameters are consistent with elevated  ?  ventricular end-diastolic filling pressure.  ?- Aortic valve: Trileaflet; normal thickness, mildly calcified  ?  leaflets. Transvalvular velocity was within the normal range.  ?  There was no stenosis. There was no regurgitation.  ?- Mitral valve: Transvalvular velocity was within the normal range.  ?  There was no evidence for stenosis. There was trivial  ?  regurgitation.  ?-  Right ventricle: The cavity size was normal. Wall thickness was  ?  normal.  ?- Atrial septum: No defect or patent foramen ovale was identified  ?  by color flow Doppler.  ?- Tricuspid valve: There was trivial regurgitation.  ?- Inferior vena cava: The vessel was normal in size. The  ?  respirophasic diameter changes were in the normal range (= 50%),  ?  consistent with normal central venous pressure.  ? ?Laboratory Data: ? ?High Sensitivity Troponin:   ?Recent Labs  ?Lab 01/07/22 ?2234 01/08/22 ?0105  ?TROPONINIHS 523* 1,396*  ?    ?Chemistry ?Recent Labs  ?Lab 01/07/22 ?2234  ?NA 138  ?K 3.5  ?CL 105  ?CO2 23  ?GLUCOSE 159*  ?BUN 18  ?CREATININE 0.83  ?CALCIUM 9.4  ?GFRNONAA >60  ?ANIONGAP 10  ?  ?Recent Labs  ?Lab 01/07/22 ?2234  ?PROT 6.7  ?ALBUMIN 4.0  ?AST 14*  ?ALT 13  ?ALKPHOS 67  ?BILITOT 0.5  ? ?Lipids No results for input(s): CHOL, TRIG, HDL, LABVLDL, LDLCALC, CHOLHDL in the last 168 hours. ?Hematology ?Recent Labs  ?Lab 01/07/22 ?2234  ?WBC 10.2  ?RBC 4.25  ?HGB 13.7  ?HCT 41.1  ?MCV 96.7  ?MCH 32.2  ?MCHC 33.3  ?RDW 12.1  ?PLT 245  ? ?Thyroid No results for input(s): TSH, FREET4 in the last 168 hours. ?BNPNo results for input(s): BNP, PROBNP in the last 168 hours.  ?DDimer No results for input(s): DDIMER in the last 168 hours. ? ? ?Radiology/Studies:  ?DG Chest 2 View ? ?Result Date: 01/07/2022 ?CLINICAL DATA:  Chest pain. EXAM: CHEST - 2 VIEW COMPARISON:  Chest x-ray 11/13/2015 FINDINGS: The heart size and mediastinal contours are within normal limits.  Both lungs are clear. Cervical spinal fusion plate is present. No acute fractures are seen. IMPRESSION: No active cardiopulmonary disease. Electronically Signed   By: Darliss Cheney M.D.   On: 01/07/2022

## 2022-01-08 NOTE — Progress Notes (Addendum)
? ?Progress Note ? ?Patient Name: [name] Trujillo ?Date of Encounter: 01/08/2022 ? ?[facility] Cardiologist: None  ? ?Subjective  ? ?No recurrent chest pain. /Shortness of breath, PND or orthopnea ? ?Inpatient Medications  ?  ?Scheduled Meds: ? [START ON 01/09/2022] aspirin EC  81 mg Oral Daily  ? aspirin  325 mg Oral Daily  ? atorvastatin  80 mg Oral Daily  ? levothyroxine  88 mcg Oral Q0600  ? metoprolol tartrate  12.5 mg Oral BID  ? ?Continuous Infusions: ? heparin 600 Units/hr (01/08/22 0416)  ? ?PRN Meds: ?acetaminophen, nitroGLYCERIN, ondansetron (ZOFRAN) IV, perflutren lipid microspheres (DEFINITY) IV suspension  ? ?Vital Signs  ?  ?Vitals:  ? 01/08/22 0912 01/08/22 0913 01/08/22 0915 01/08/22 1152  ?BP: 111/66  107/83 106/61  ?Pulse: 92  95 80  ?Resp:  17 18 19   ?Temp:      ?TempSrc:      ?SpO2:   94% 92%  ?Weight:      ?Height:      ? ? ?Intake/Output Summary (Last 24 hours) at 01/08/2022 1204 ?Last data filed at 01/08/2022 03/10/2022 ?Gross per 24 hour  ?Intake 1000 ml  ?Output --  ?Net 1000 ml  ? ? ?  01/08/2022  ?  3:52 AM 08/03/2018  ?  2:23 PM 11/26/2016  ?  1:27 PM  ?Last 3 Weights  ?Weight (lbs) 119 lb 128 lb 126 lb 9.6 oz  ?Weight (kg) 53.978 kg 58.06 kg 57.425 kg  ?   ? ?Telemetry  ?  ?Sinus Rhythm - Personally Reviewed ? ?ECG  ?  ?No new tracing ? ?Physical Exam  ? ?GEN: No acute distress.  Sitting up comfortably in the side of the bed.  No complaints. ?Neck: No JVD ?Cardiac: RRR, no murmurs, rubs, or gallops.  ?Respiratory: Clear to auscultation bilaterally. ?GI: Soft, nontender, non-distended  ?MS: No edema; No deformity. ?Neuro:  Nonfocal  ?Psych: Normal affect  ? ?Labs  ?  ?High Sensitivity Troponin:   ?Recent Labs  ?Lab 01/07/22 ?2234 01/08/22 ?0105  ?TROPONINIHS 523* 1,396*  ?   ?Chemistry ?Recent Labs  ?Lab 01/07/22 ?2234  ?NA 138  ?K 3.5  ?CL 105  ?CO2 23  ?GLUCOSE 159*  ?BUN 18  ?CREATININE 0.83  ?CALCIUM 9.4  ?PROT 6.7  ?ALBUMIN 4.0  ?AST 14*  ?ALT 13  ?ALKPHOS 67  ?BILITOT 0.5  ?GFRNONAA >60   ?ANIONGAP 10  ?  ?Lipids  ?Recent Labs  ?Lab 01/08/22 ?0251  ?CHOL 144  ?TRIG 25  ?HDL 58  ?LDLCALC 81  ?CHOLHDL 2.5  ?  ?Hematology ?Recent Labs  ?Lab 01/07/22 ?2234 01/08/22 ?0425  ?WBC 10.2 10.8*  ?RBC 4.25 4.00  ?HGB 13.7 13.3  ?HCT 41.1 37.9  ?MCV 96.7 94.8  ?MCH 32.2 33.3  ?MCHC 33.3 35.1  ?RDW 12.1 12.2  ?PLT 245 261  ? ?Thyroid No results for input(s): TSH, FREET4 in the last 168 hours.  ?BNP ?Recent Labs  ?Lab 01/08/22 ?0251  ?BNP 162.1*  ?  ?DDimer No results for input(s): DDIMER in the last 168 hours.  ? ?Radiology  ?  ?DG Chest 2 View ? ?Result Date: 01/07/2022 ?CLINICAL DATA:  Chest pain. EXAM: CHEST - 2 VIEW COMPARISON:  Chest x-ray 11/13/2015 FINDINGS: The heart size and mediastinal contours are within normal limits. Both lungs are clear. Cervical spinal fusion plate is present. No acute fractures are seen. IMPRESSION: No active cardiopulmonary disease. Electronically Signed   By: 01/13/2016 M.D.   On: 01/07/2022  22:51  ? ?CT Angio Chest/Abd/Pel for Dissection W and/or Wo Contrast ? ?Result Date: 01/08/2022 ?CLINICAL DATA:  Chest pain, recent fall while on blood thinners EXAM: CT ANGIOGRAPHY CHEST, ABDOMEN AND PELVIS TECHNIQUE: Non-contrast CT of the chest was initially obtained. Multidetector CT imaging through the chest, abdomen and pelvis was performed using the standard protocol during bolus administration of intravenous contrast. Multiplanar reconstructed images and MIPs were obtained and reviewed to evaluate the vascular anatomy. RADIATION DOSE REDUCTION: This exam was performed according to the departmental dose-optimization program which includes automated exposure control, adjustment of the mA and/or kV according to patient size and/or use of iterative reconstruction technique. CONTRAST:  OMNIPAQUE IOHEXOL 350 MG/ML SOLN COMPARISON:  Chest x-ray from earlier in the same day. FINDINGS: CTA CHEST FINDINGS Cardiovascular: Initial precontrast images demonstrate no hyperdense crescent to  suggest acute aortic injury. Post-contrast images demonstrate atherosclerotic calcifications of the thoracic aorta. No aneurysmal dilatation or dissection is noted. No cardiac enlargement is seen. No coronary calcifications are noted. No pulmonary emboli are noted. Mediastinum/Nodes: Thoracic inlet is within normal limits. No sizable hilar or mediastinal adenopathy is noted. The esophagus shows evidence of a small sliding-type hiatal hernia. Lungs/Pleura: Mild emphysematous changes of lungs are noted. No acute infiltrate or sizable effusion is seen. Mild dependent atelectatic changes are noted. No parenchymal nodules are noted. Musculoskeletal: Postsurgical changes in the lower cervical spine are seen. No acute rib abnormality is noted. Old healed rib fractures are noted on the right. Review of the MIP images confirms the above findings. CTA ABDOMEN AND PELVIS FINDINGS VASCULAR Aorta: Atherosclerotic calcifications are noted. No aneurysmal dilatation or dissection is seen. Celiac: Patent without evidence of aneurysm, dissection, vasculitis or significant stenosis. SMA: Patent without evidence of aneurysm, dissection, vasculitis or significant stenosis. 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 Single renal artery on the right. No significant stenosis is seen. IMA: Patent without evidence of aneurysm, dissection, vasculitis or significant stenosis. Inflow: Iliac show atherosclerotic calcifications without aneurysmal dilatation or dissection. Veins: No specific venous abnormality is noted. Review of the MIP images confirms the above findings. NON-VASCULAR Hepatobiliary: Liver is within normal limits. Gallbladder is well distended. A few tiny dependent gallstones are seen. No ductal dilatation is seen. Pancreas: Unremarkable. No pancreatic ductal dilatation or surrounding inflammatory changes. Spleen: Normal in size without focal abnormality. Adrenals/Urinary Tract: Adrenal glands are within normal limits.  Kidneys demonstrate a normal enhancement pattern bilaterally. No renal calculi or obstructive changes are seen. Ureters are within normal limits. The bladder is well distended. Stomach/Bowel: Diverticular changes noted most prevalent in the sigmoid colon with smooth muscle hypertrophy. No obstructive or inflammatory changes are noted. The appendix is not seen consistent with prior surgical excision. Small bowel and stomach again show hiatal hernia. Destructive changes are noted. Lymphatic: No significant lymphadenopathy is noted. Reproductive: Status post hysterectomy. No adnexal masses. Other: No abdominal wall hernia or abnormality. No abdominopelvic ascites. Musculoskeletal: Degenerative changes of lumbar spine are noted. Review of the MIP images confirms the above findings. IMPRESSION: CTA of the chest: Atherosclerotic calcifications without aneurysmal dilatation or dissection. Small sliding-type hiatal hernia. Emphysematous changes. CTA of the abdomen and pelvis: Few small dependent gallstones without complicating factors. Diverticulosis without diverticulitis. Aortic Atherosclerosis (ICD10-I70.0) and Emphysema (ICD10-J43.9). Electronically Signed   By: Alcide Clever M.D.   On: 01/08/2022 02:07   ? ?Cardiac Studies  ? ?Echo: EF 25 to 30% with severely decreased function.  GRII DD.  Normal basal function with mid to apical  akinesis consider Takotsubo cardiomyopathy versus wraparound LAD CAD.  Moderate LA dilation.  Mild to moderate TR moderately elevated PAP ? ?Patient Profile  ?   ?80 y.o. female with hx of hyperlipidemia and hypothyroidism who is being seen 01/08/2022 for the evaluation of NSTEMI. ?  ?Assessment & Plan  ?  ?NSTEMI: hsTn 523>>1396, remains on IV heparin. No recurrent chest pain after receiving nitro. Plan for cardiac cath today to rule out obstructive disease.  ?-- continue IV heparin, ASA, statin, metoprolol, nitropaste ?-- echo pending ? 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  CTA of the chest does not s

## 2022-01-08 NOTE — Progress Notes (Signed)
ANTICOAGULATION CONSULT NOTE - Initial Consult ? ?Pharmacy Consult for heparin ?Indication:  NSTEMI ? ?Allergies  ?Allergen Reactions  ? Oxycodone Hcl Nausea And Vomiting  ? Primidone   ? Propranolol   ? Tolterodine   ? Hydrocodone Nausea And Vomiting  ? ? ?Patient Measurements: ?Height: 5\' 1"  (154.9 cm) ?Weight: 54 kg (119 lb) ?IBW/kg (Calculated) : 47.8 ? ?Vital Signs: ?Temp: 98 ?F (36.7 ?C) (05/01 2220) ?Temp Source: Oral (05/01 2220) ?BP: 124/99 (05/02 0345) ?Pulse Rate: 99 (05/02 0345) ? ?Labs: ?Recent Labs  ?  01/07/22 ?2234 01/08/22 ?0105  ?HGB 13.7  --   ?HCT 41.1  --   ?PLT 245  --   ?CREATININE 0.83  --   ?TROPONINIHS 523* 1,396*  ? ? ?Estimated Creatinine Clearance: 42.2 mL/min (by C-G formula based on SCr of 0.83 mg/dL). ? ? ?Medical History: ?Past Medical History:  ?Diagnosis Date  ? High cholesterol   ? Hypothyroidism   ? Neuropathy   ? ? ?Assessment: ?81yo female c/o CP associated w/ nausea, troponin found to be elevated and rising, to begin heparin. ? ?Goal of Therapy:  ?Heparin level 0.3-0.7 units/ml ?Monitor platelets by anticoagulation protocol: Yes ?  ?Plan:  ?Heparin 3000 units IV bolus x1 followed by infusion at 600 units/hr and monitor heparin levels and CBC. ? ?Wynona Neat, PharmD, BCPS  ?01/08/2022,4:05 AM ? ? ?

## 2022-01-08 NOTE — ED Provider Notes (Signed)
?[facility] ?Provider Note ? ? ?CSN: [id number] ?Arrival date & time: 01/07/22  2211 ? ?  ? ?History ? ?Chief Complaint  ?Patient presents with  ? Chest Pain  ? Emesis  ? ? ?Biagio QuintCaryol Reever is a 81 y.o. female. ? ?HPI ? ?  ? ?This is a 81 year old female with a history of hyperlipidemia who presents with chest pain.  Patient reports that she had called 911 for her husband who fell.  She was preparing to come to the hospital with her husband when she had acute onset of anterior pressure-like chest pain and tightness.  She states that it was nonradiating.  It has persisted.  She is currently having 2 out of 10 pain.  She had associated nonbilious, nonbloody emesis x4.  Denies any recent changes in bowels.  Denies any fevers or cough.  Denies any significant diaphoresis.  Does report some associated shortness of breath.  Patient denies any history of heart disease.  She is a non-smoker.  Denies history of hypertension. ? ?Home Medications ?Prior to Admission medications   ?Medication Sig Start Date End Date Taking? Authorizing Provider  ?B Complex-C (SUPER B COMPLEX PO) Take by mouth daily. ?Patient not taking: Reported on 11/22/2021    [provider]  ?levothyroxine (SYNTHROID, LEVOTHROID) 100 MCG tablet Take 88 mcg by mouth daily before breakfast.    [provider]  ?rosuvastatin (CRESTOR) 10 MG tablet rosuvastatin 10 mg tablet ? TAKE ONE TABLET BY MOUTH EVERY NIGHT AFTER MEAL(S)    [provider]  ?solifenacin (VESICARE) 5 MG tablet Take 10 mg by mouth daily.    [provider]  ?   ? ?Allergies    ?Oxycodone hcl, Primidone, Propranolol, Tolterodine, and Hydrocodone   ? ?Review of Systems   ?Review of Systems  ?Constitutional:  Negative for fever.  ?Respiratory:  Positive for chest tightness and shortness of breath.   ?Gastrointestinal:  Positive for nausea and vomiting. Negative for abdominal pain and diarrhea.  ?All other systems reviewed  and are negative. ? ?Physical Exam ?Updated Vital Signs ?BP 128/73   Pulse (!) 108   Temp 98 ?F (36.7 ?C) (Oral)   Resp 18   SpO2 91%  ?Physical Exam ?Vitals and nursing note reviewed.  ?Constitutional:   ?   Appearance: She is well-developed. She is not ill-appearing.  ?HENT:  ?   Head: Normocephalic and atraumatic.  ?Eyes:  ?   Pupils: Pupils are equal, round, and reactive to light.  ?Cardiovascular:  ?   Rate and Rhythm: Normal rate and regular rhythm.  ?   Heart sounds: Normal heart sounds.  ?Pulmonary:  ?   Effort: Pulmonary effort is normal. No respiratory distress.  ?   Breath sounds: No wheezing.  ?Abdominal:  ?   General: Bowel sounds are normal.  ?   Palpations: Abdomen is soft.  ?Musculoskeletal:  ?   Cervical back: Neck supple.  ?   Right lower leg: Edema present.  ?   Left lower leg: Edema present.  ?Skin: ?   General: Skin is warm and dry.  ?Neurological:  ?   General: No focal deficit present.  ?   Mental Status: She is alert and oriented to person, place, and time.  ?Psychiatric:     ?   Mood and Affect: Mood normal.  ? ? ?ED Results / Procedures / Treatments   ?Labs ?(all labs ordered are listed, but only abnormal results are displayed) ?Labs Reviewed  ?  COMPREHENSIVE METABOLIC PANEL - Abnormal; Notable for the following components:  ?    Result Value  ? Glucose, Bld 159 (*)   ? AST 14 (*)   ? All other components within normal limits  ?CBC WITH DIFFERENTIAL/PLATELET - Abnormal; Notable for the following components:  ? Neutro Abs 8.2 (*)   ? All other components within normal limits  ?TROPONIN I (HIGH SENSITIVITY) - Abnormal; Notable for the following components:  ? Troponin I (High Sensitivity) 523 (*)   ? All other components within normal limits  ?TROPONIN I (HIGH SENSITIVITY) - Abnormal; Notable for the following components:  ? Troponin I (High Sensitivity) 1,396 (*)   ? All other components within normal limits  ?LIPASE, BLOOD  ?BRAIN NATRIURETIC PEPTIDE  ?HEMOGLOBIN A1C  ?LIPID PANEL   ? ? ?EKG ?EKG Interpretation ? ?Date/Time:  Monday Jan 07 2022 22:18:56 EDT ?Ventricular Rate:  82 ?PR Interval:  166 ?QRS Duration: 90 ?QT Interval:  352 ?QTC Calculation: 411 ?R Axis:   83 ?Text Interpretation: Normal sinus rhythm with sinus arrhythmia Normal ECG No previous ECGs available Confirmed by Ross Marcus ([id number]) on 01/08/2022 12:59:37 AM ? ?Radiology ?DG Chest 2 View ? ?Result Date: 01/07/2022 ?CLINICAL DATA:  Chest pain. EXAM: CHEST - 2 VIEW COMPARISON:  Chest x-ray 11/13/2015 FINDINGS: The heart size and mediastinal contours are within normal limits. Both lungs are clear. Cervical spinal fusion plate is present. No acute fractures are seen. IMPRESSION: No active cardiopulmonary disease. Electronically Signed   By: Darliss Cheney M.D.   On: 01/07/2022 22:51  ? ?CT Angio Chest/Abd/Pel for Dissection W and/or Wo Contrast ? ?Result Date: 01/08/2022 ?CLINICAL DATA:  Chest pain, recent fall while on blood thinners EXAM: CT ANGIOGRAPHY CHEST, ABDOMEN AND PELVIS TECHNIQUE: Non-contrast CT of the chest was initially obtained. Multidetector CT imaging through the chest, abdomen and pelvis was performed using the standard protocol during bolus administration of intravenous contrast. Multiplanar reconstructed images and MIPs were obtained and reviewed to evaluate the vascular anatomy. RADIATION DOSE REDUCTION: This exam was performed according to the departmental dose-optimization program which includes automated exposure control, adjustment of the mA and/or kV according to patient size and/or use of iterative reconstruction technique. CONTRAST:  OMNIPAQUE IOHEXOL 350 MG/ML SOLN COMPARISON:  Chest x-ray from earlier in the same day. FINDINGS: CTA CHEST FINDINGS Cardiovascular: Initial precontrast images demonstrate no hyperdense crescent to suggest acute aortic injury. Post-contrast images demonstrate atherosclerotic calcifications of the thoracic aorta. No aneurysmal dilatation or dissection is noted. No  cardiac enlargement is seen. No coronary calcifications are noted. No pulmonary emboli are noted. Mediastinum/Nodes: Thoracic inlet is within normal limits. No sizable hilar or mediastinal adenopathy is noted. The esophagus shows evidence of a small sliding-type hiatal hernia. Lungs/Pleura: Mild emphysematous changes of lungs are noted. No acute infiltrate or sizable effusion is seen. Mild dependent atelectatic changes are noted. No parenchymal nodules are noted. Musculoskeletal: Postsurgical changes in the lower cervical spine are seen. No acute rib abnormality is noted. Old healed rib fractures are noted on the right. Review of the MIP images confirms the above findings. CTA ABDOMEN AND PELVIS FINDINGS VASCULAR Aorta: Atherosclerotic calcifications are noted. No aneurysmal dilatation or dissection is seen. Celiac: Patent without evidence of aneurysm, dissection, vasculitis or significant stenosis. SMA: Patent without evidence of aneurysm, dissection, vasculitis or significant stenosis. 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 Single renal artery on the right. No significant stenosis is seen. IMA: Patent without evidence of aneurysm, dissection, vasculitis or  significant stenosis. Inflow: Iliac show atherosclerotic calcifications without aneurysmal dilatation or dissection. Veins: No specific venous abnormality is noted. Review of the MIP images confirms the above findings. NON-VASCULAR Hepatobiliary: Liver is within normal limits. Gallbladder is well distended. A few tiny dependent gallstones are seen. No ductal dilatation is seen. Pancreas: Unremarkable. No pancreatic ductal dilatation or surrounding inflammatory changes. Spleen: Normal in size without focal abnormality. Adrenals/Urinary Tract: Adrenal glands are within normal limits. Kidneys demonstrate a normal enhancement pattern bilaterally. No renal calculi or obstructive changes are seen. Ureters are within normal limits. The bladder is well  distended. Stomach/Bowel: Diverticular changes noted most prevalent in the sigmoid colon with smooth muscle hypertrophy. No obstructive or inflammatory changes are noted. The appendix is not seen consistent with prior su

## 2022-01-08 NOTE — H&P (View-Only) (Signed)
? ?Progress Note ? ?Patient Name: [name] Trujillo ?Date of Encounter: 01/08/2022 ? ?[facility] Cardiologist: None  ? ?Subjective  ? ?No recurrent chest pain. /Shortness of breath, PND or orthopnea ? ?Inpatient Medications  ?  ?Scheduled Meds: ? [START ON 01/09/2022] aspirin EC  81 mg Oral Daily  ? aspirin  325 mg Oral Daily  ? atorvastatin  80 mg Oral Daily  ? levothyroxine  88 mcg Oral Q0600  ? metoprolol tartrate  12.5 mg Oral BID  ? ?Continuous Infusions: ? heparin 600 Units/hr (01/08/22 0416)  ? ?PRN Meds: ?acetaminophen, nitroGLYCERIN, ondansetron (ZOFRAN) IV, perflutren lipid microspheres (DEFINITY) IV suspension  ? ?Vital Signs  ?  ?Vitals:  ? 01/08/22 0912 01/08/22 0913 01/08/22 0915 01/08/22 1152  ?BP: 111/66  107/83 106/61  ?Pulse: 92  95 80  ?Resp:  17 18 19   ?Temp:      ?TempSrc:      ?SpO2:   94% 92%  ?Weight:      ?Height:      ? ? ?Intake/Output Summary (Last 24 hours) at 01/08/2022 1204 ?Last data filed at 01/08/2022 03/10/2022 ?Gross per 24 hour  ?Intake 1000 ml  ?Output --  ?Net 1000 ml  ? ? ?  01/08/2022  ?  3:52 AM 08/03/2018  ?  2:23 PM 11/26/2016  ?  1:27 PM  ?Last 3 Weights  ?Weight (lbs) 119 lb 128 lb 126 lb 9.6 oz  ?Weight (kg) 53.978 kg 58.06 kg 57.425 kg  ?   ? ?Telemetry  ?  ?Sinus Rhythm - Personally Reviewed ? ?ECG  ?  ?No new tracing ? ?Physical Exam  ? ?GEN: No acute distress.  Sitting up comfortably in the side of the bed.  No complaints. ?Neck: No JVD ?Cardiac: RRR, no murmurs, rubs, or gallops.  ?Respiratory: Clear to auscultation bilaterally. ?GI: Soft, nontender, non-distended  ?MS: No edema; No deformity. ?Neuro:  Nonfocal  ?Psych: Normal affect  ? ?Labs  ?  ?High Sensitivity Troponin:   ?Recent Labs  ?Lab 01/07/22 ?2234 01/08/22 ?0105  ?TROPONINIHS 523* 1,396*  ?   ?Chemistry ?Recent Labs  ?Lab 01/07/22 ?2234  ?NA 138  ?K 3.5  ?CL 105  ?CO2 23  ?GLUCOSE 159*  ?BUN 18  ?CREATININE 0.83  ?CALCIUM 9.4  ?PROT 6.7  ?ALBUMIN 4.0  ?AST 14*  ?ALT 13  ?ALKPHOS 67  ?BILITOT 0.5  ?GFRNONAA >60   ?ANIONGAP 10  ?  ?Lipids  ?Recent Labs  ?Lab 01/08/22 ?0251  ?CHOL 144  ?TRIG 25  ?HDL 58  ?LDLCALC 81  ?CHOLHDL 2.5  ?  ?Hematology ?Recent Labs  ?Lab 01/07/22 ?2234 01/08/22 ?0425  ?WBC 10.2 10.8*  ?RBC 4.25 4.00  ?HGB 13.7 13.3  ?HCT 41.1 37.9  ?MCV 96.7 94.8  ?MCH 32.2 33.3  ?MCHC 33.3 35.1  ?RDW 12.1 12.2  ?PLT 245 261  ? ?Thyroid No results for input(s): TSH, FREET4 in the last 168 hours.  ?BNP ?Recent Labs  ?Lab 01/08/22 ?0251  ?BNP 162.1*  ?  ?DDimer No results for input(s): DDIMER in the last 168 hours.  ? ?Radiology  ?  ?DG Chest 2 View ? ?Result Date: 01/07/2022 ?CLINICAL DATA:  Chest pain. EXAM: CHEST - 2 VIEW COMPARISON:  Chest x-ray 11/13/2015 FINDINGS: The heart size and mediastinal contours are within normal limits. Both lungs are clear. Cervical spinal fusion plate is present. No acute fractures are seen. IMPRESSION: No active cardiopulmonary disease. Electronically Signed   By: 01/13/2016 M.D.   On: 01/07/2022  22:51  ? ?CT Angio Chest/Abd/Pel for Dissection W and/or Wo Contrast ? ?Result Date: 01/08/2022 ?CLINICAL DATA:  Chest pain, recent fall while on blood thinners EXAM: CT ANGIOGRAPHY CHEST, ABDOMEN AND PELVIS TECHNIQUE: Non-contrast CT of the chest was initially obtained. Multidetector CT imaging through the chest, abdomen and pelvis was performed using the standard protocol during bolus administration of intravenous contrast. Multiplanar reconstructed images and MIPs were obtained and reviewed to evaluate the vascular anatomy. RADIATION DOSE REDUCTION: This exam was performed according to the departmental dose-optimization program which includes automated exposure control, adjustment of the mA and/or kV according to patient size and/or use of iterative reconstruction technique. CONTRAST:  OMNIPAQUE IOHEXOL 350 MG/ML SOLN COMPARISON:  Chest x-ray from earlier in the same day. FINDINGS: CTA CHEST FINDINGS Cardiovascular: Initial precontrast images demonstrate no hyperdense crescent to  suggest acute aortic injury. Post-contrast images demonstrate atherosclerotic calcifications of the thoracic aorta. No aneurysmal dilatation or dissection is noted. No cardiac enlargement is seen. No coronary calcifications are noted. No pulmonary emboli are noted. Mediastinum/Nodes: Thoracic inlet is within normal limits. No sizable hilar or mediastinal adenopathy is noted. The esophagus shows evidence of a small sliding-type hiatal hernia. Lungs/Pleura: Mild emphysematous changes of lungs are noted. No acute infiltrate or sizable effusion is seen. Mild dependent atelectatic changes are noted. No parenchymal nodules are noted. Musculoskeletal: Postsurgical changes in the lower cervical spine are seen. No acute rib abnormality is noted. Old healed rib fractures are noted on the right. Review of the MIP images confirms the above findings. CTA ABDOMEN AND PELVIS FINDINGS VASCULAR Aorta: Atherosclerotic calcifications are noted. No aneurysmal dilatation or dissection is seen. Celiac: Patent without evidence of aneurysm, dissection, vasculitis or significant stenosis. SMA: Patent without evidence of aneurysm, dissection, vasculitis or significant stenosis. 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 Single renal artery on the right. No significant stenosis is seen. IMA: Patent without evidence of aneurysm, dissection, vasculitis or significant stenosis. Inflow: Iliac show atherosclerotic calcifications without aneurysmal dilatation or dissection. Veins: No specific venous abnormality is noted. Review of the MIP images confirms the above findings. NON-VASCULAR Hepatobiliary: Liver is within normal limits. Gallbladder is well distended. A few tiny dependent gallstones are seen. No ductal dilatation is seen. Pancreas: Unremarkable. No pancreatic ductal dilatation or surrounding inflammatory changes. Spleen: Normal in size without focal abnormality. Adrenals/Urinary Tract: Adrenal glands are within normal limits.  Kidneys demonstrate a normal enhancement pattern bilaterally. No renal calculi or obstructive changes are seen. Ureters are within normal limits. The bladder is well distended. Stomach/Bowel: Diverticular changes noted most prevalent in the sigmoid colon with smooth muscle hypertrophy. No obstructive or inflammatory changes are noted. The appendix is not seen consistent with prior surgical excision. Small bowel and stomach again show hiatal hernia. Destructive changes are noted. Lymphatic: No significant lymphadenopathy is noted. Reproductive: Status post hysterectomy. No adnexal masses. Other: No abdominal wall hernia or abnormality. No abdominopelvic ascites. Musculoskeletal: Degenerative changes of lumbar spine are noted. Review of the MIP images confirms the above findings. IMPRESSION: CTA of the chest: Atherosclerotic calcifications without aneurysmal dilatation or dissection. Small sliding-type hiatal hernia. Emphysematous changes. CTA of the abdomen and pelvis: Few small dependent gallstones without complicating factors. Diverticulosis without diverticulitis. Aortic Atherosclerosis (ICD10-I70.0) and Emphysema (ICD10-J43.9). Electronically Signed   By: Alcide Clever M.D.   On: 01/08/2022 02:07   ? ?Cardiac Studies  ? ?Echo: EF 25 to 30% with severely decreased function.  GRII DD.  Normal basal function with mid to apical  akinesis consider Takotsubo cardiomyopathy versus wraparound LAD CAD.  Moderate LA dilation.  Mild to moderate TR moderately elevated PAP ? ?Patient Profile  ?   ?81 y.o. female with hx of hyperlipidemia and hypothyroidism who is being seen 01/08/2022 for the evaluation of NSTEMI. ?  ?Assessment & Plan  ?  ?NSTEMI: hsTn 523>>1396, remains on IV heparin. No recurrent chest pain after receiving nitro. Plan for cardiac cath today to rule out obstructive disease.  ?-- continue IV heparin, ASA, statin, metoprolol, nitropaste ?-- echo pending ? 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  CTA of the chest does not s

## 2022-01-08 NOTE — ED Notes (Signed)
Pt assisted to bathroom in w/c  ?

## 2022-01-08 NOTE — Progress Notes (Signed)
ANTICOAGULATION CONSULT NOTE - Initial Consult ? ?Pharmacy Consult for heparin ?Indication:  NSTEMI ? ?Allergies  ?Allergen Reactions  ? Oxycodone Hcl Nausea And Vomiting  ? Primidone   ? Propranolol   ? Tolterodine   ? Hydrocodone Nausea And Vomiting  ? ? ?Patient Measurements: ?Height: 5\' 1"  (154.9 cm) ?Weight: 53.6 kg (118 lb 2.7 oz) ?IBW/kg (Calculated) : 47.8 ? ?Vital Signs: ?Temp: 97.9 ?F (36.6 ?C) (05/02 2029) ?Temp Source: Oral (05/02 2029) ?BP: 102/62 (05/02 2029) ?Pulse Rate: 84 (05/02 2029) ? ?Labs: ?Recent Labs  ?  01/07/22 ?2234 01/08/22 ?0105 01/08/22 ?0425 01/08/22 ?1230 01/08/22 ?2210  ?HGB 13.7  --  13.3  --   --   ?HCT 41.1  --  37.9  --   --   ?PLT 245  --  261  --   --   ?HEPARINUNFRC  --   --   --  0.27* 0.12*  ?CREATININE 0.83  --   --   --   --   ?TROPONINIHS 523* 1,396*  --   --   --   ? ? ? ?Estimated Creatinine Clearance: 42.2 mL/min (by C-G formula based on SCr of 0.83 mg/dL). ? ? ?Medical History: ?Past Medical History:  ?Diagnosis Date  ? High cholesterol   ? Hypothyroidism   ? Neuropathy   ? ? ?Assessment: ?81yo female c/o CP associated w/ nausea, troponin found to be elevated and rising in the setting of her husband falling . No anticoagulation prior to admission. Pharmacy consulted for heparin.   ? ?Heparin level 0.12 is subtherapeutic on 650 units/hr. Plan for Cath 5/2.  No issues with infusion or bleeding per RN.   ? ? ?Goal of Therapy:  ?Heparin level 0.3-0.7 units/ml ?Monitor platelets by anticoagulation protocol: Yes ?  ?Plan:  ?Increase Heparin to 750 units/hr ?F/u 8hr HL  ?Monitor daily heparin level, CBC ?Monitor for signs/symptoms of bleeding  ?F/u post-cath plan  ? ?7/2, PharmD, FCCM ?Clinical Pharmacist ?Please see AMION for all Pharmacists' Contact Phone Numbers ?01/08/2022, 10:48 PM  ? ? ?

## 2022-01-09 ENCOUNTER — Encounter (HOSPITAL_COMMUNITY): Payer: Self-pay | Admitting: Cardiovascular Disease

## 2022-01-09 ENCOUNTER — Encounter (HOSPITAL_COMMUNITY)
Admission: EM | Disposition: A | Discharge status: Home / Self Care | Payer: Self-pay | Source: Home / Self Care | Attending: Internal Medicine

## 2022-01-09 DIAGNOSIS — I248 Other forms of acute ischemic heart disease: Secondary | ICD-10-CM | POA: Diagnosis not present

## 2022-01-09 DIAGNOSIS — R778 Other specified abnormalities of plasma proteins: Secondary | ICD-10-CM

## 2022-01-09 DIAGNOSIS — I5181 Takotsubo syndrome: Secondary | ICD-10-CM | POA: Diagnosis not present

## 2022-01-09 HISTORY — PX: LEFT HEART CATH AND CORONARY ANGIOGRAPHY: CATH118249

## 2022-01-09 LAB — CBC
HCT: 38.8 % (ref 36.0–46.0)
Hemoglobin: 13.2 g/dL (ref 12.0–15.0)
MCH: 32.4 pg (ref 26.0–34.0)
MCHC: 34 g/dL (ref 30.0–36.0)
MCV: 95.1 fL (ref 80.0–100.0)
Platelets: 212 10*3/uL (ref 150–400)
RBC: 4.08 MIL/uL (ref 3.87–5.11)
RDW: 12.4 % (ref 11.5–15.5)
WBC: 6.2 10*3/uL (ref 4.0–10.5)
nRBC: 0 % (ref 0.0–0.2)

## 2022-01-09 SURGERY — LEFT HEART CATH AND CORONARY ANGIOGRAPHY
Anesthesia: LOCAL

## 2022-01-09 MED ORDER — LIDOCAINE HCL (PF) 1 % IJ SOLN
INTRAMUSCULAR | Status: DC | PRN
  Administered 2022-01-09: 2 mL

## 2022-01-09 MED ORDER — IOHEXOL 350 MG/ML SOLN
INTRAVENOUS | Status: DC | PRN
  Administered 2022-01-09: 20 mL

## 2022-01-09 MED ORDER — SPIRONOLACTONE 12.5 MG HALF TABLET
12.5000 mg | ORAL_TABLET | Freq: Every day | ORAL | Status: DC
  Administered 2022-01-09 – 2022-01-10 (×2): 12.5 mg via ORAL
  Filled 2022-01-09 (×2): qty 1

## 2022-01-09 MED ORDER — FENTANYL CITRATE (PF) 100 MCG/2ML IJ SOLN
INTRAMUSCULAR | Status: AC
  Filled 2022-01-09: qty 2

## 2022-01-09 MED ORDER — SODIUM CHLORIDE 0.9 % IV SOLN: 250.0000 mL | INTRAVENOUS | Status: DC | PRN

## 2022-01-09 MED ORDER — HEPARIN (PORCINE) IN NACL 1000-0.9 UT/500ML-% IV SOLN
INTRAVENOUS | Status: AC
  Filled 2022-01-09: qty 500

## 2022-01-09 MED ORDER — HEPARIN SODIUM (PORCINE) 1000 UNIT/ML IJ SOLN
INTRAMUSCULAR | Status: AC
  Filled 2022-01-09: qty 10

## 2022-01-09 MED ORDER — MIDAZOLAM HCL 2 MG/2ML IJ SOLN
INTRAMUSCULAR | Status: DC | PRN
  Administered 2022-01-09 (×2): 1 mg via INTRAVENOUS

## 2022-01-09 MED ORDER — SODIUM CHLORIDE 0.9 % WEIGHT BASED INFUSION
1.0000 mL/kg/h | INTRAVENOUS | Status: AC
  Administered 2022-01-09: 1 mL/kg/h via INTRAVENOUS

## 2022-01-09 MED ORDER — VERAPAMIL HCL 2.5 MG/ML IV SOLN
INTRAVENOUS | Status: DC | PRN
  Administered 2022-01-09: 10 mL via INTRA_ARTERIAL

## 2022-01-09 MED ORDER — MIDAZOLAM HCL 2 MG/2ML IJ SOLN
INTRAMUSCULAR | Status: AC
  Filled 2022-01-09: qty 2

## 2022-01-09 MED ORDER — ROSUVASTATIN CALCIUM 20 MG PO TABS
20.0000 mg | ORAL_TABLET | Freq: Every day | ORAL | Status: DC
  Administered 2022-01-09 – 2022-01-10 (×2): 20 mg via ORAL
  Filled 2022-01-09 (×2): qty 1

## 2022-01-09 MED ORDER — LIDOCAINE HCL (PF) 1 % IJ SOLN
INTRAMUSCULAR | Status: AC
  Filled 2022-01-09: qty 30

## 2022-01-09 MED ORDER — HEPARIN (PORCINE) IN NACL 1000-0.9 UT/500ML-% IV SOLN
INTRAVENOUS | Status: DC | PRN
Start: 2022-01-09 — End: 2022-01-09
  Administered 2022-01-09 (×2): 500 mL

## 2022-01-09 MED ORDER — VERAPAMIL HCL 2.5 MG/ML IV SOLN
INTRAVENOUS | Status: AC
  Filled 2022-01-09: qty 2

## 2022-01-09 MED ORDER — SODIUM CHLORIDE 0.9 % WEIGHT BASED INFUSION: 1.0000 mL/kg/h | INTRAVENOUS | Status: DC

## 2022-01-09 MED ORDER — HEPARIN SODIUM (PORCINE) 1000 UNIT/ML IJ SOLN
INTRAMUSCULAR | Status: DC | PRN
  Administered 2022-01-09: 3000 [IU] via INTRAVENOUS

## 2022-01-09 MED ORDER — SODIUM CHLORIDE 0.9% FLUSH: 3.0000 mL | Freq: Two times a day (BID) | INTRAVENOUS | Status: DC

## 2022-01-09 MED ORDER — SODIUM CHLORIDE 0.9% FLUSH: 3.0000 mL | INTRAVENOUS | Status: DC | PRN

## 2022-01-09 MED ORDER — HYDRALAZINE HCL 20 MG/ML IJ SOLN: 10.0000 mg | INTRAMUSCULAR | Status: AC | PRN

## 2022-01-09 MED ORDER — SODIUM CHLORIDE 0.9 % WEIGHT BASED INFUSION: 3.0000 mL/kg/h | INTRAVENOUS | Status: DC

## 2022-01-09 MED ORDER — FENTANYL CITRATE (PF) 100 MCG/2ML IJ SOLN
INTRAMUSCULAR | Status: DC | PRN
  Administered 2022-01-09: 25 ug via INTRAVENOUS

## 2022-01-09 SURGICAL SUPPLY — 9 items
CATH 5FR JL3.5 JR4 ANG PIG MP (CATHETERS) ×1 IMPLANT
DEVICE RAD TR BAND REGULAR (VASCULAR PRODUCTS) ×1 IMPLANT
GLIDESHEATH SLEND SS 6F .021 (SHEATH) ×1 IMPLANT
GUIDEWIRE INQWIRE 1.5J.035X260 (WIRE) IMPLANT
INQWIRE 1.5J .035X260CM (WIRE) ×2
KIT HEART LEFT (KITS) ×2 IMPLANT
PACK CARDIAC CATHETERIZATION (CUSTOM PROCEDURE TRAY) ×2 IMPLANT
TRANSDUCER W/STOPCOCK (MISCELLANEOUS) ×2 IMPLANT
TUBING CIL FLEX 10 FLL-RA (TUBING) ×2 IMPLANT

## 2022-01-09 NOTE — Progress Notes (Addendum)
? ?Progress Note ? ?Patient Name: Tsering Leaman ?Date of Encounter: 01/09/2022 ? ?[facility] Cardiologist: None  ? ?Subjective  ? ?Feeling well this morning. Just back from cath.  ? ?Inpatient Medications  ?  ?Scheduled Meds: ? aspirin EC  81 mg Oral Daily  ? atorvastatin  80 mg Oral Daily  ? levothyroxine  88 mcg Oral Q0600  ? metoprolol succinate  25 mg Oral Daily  ? sodium chloride flush  3 mL Intravenous Q12H  ? sodium chloride flush  3 mL Intravenous Q12H  ? ?Continuous Infusions: ? sodium chloride    ? sodium chloride 1 mL/kg/hr (01/09/22 0820)  ? ?PRN Meds: ?sodium chloride, acetaminophen, hydrALAZINE, nitroGLYCERIN, ondansetron (ZOFRAN) IV, sodium chloride flush  ? ?Vital Signs  ?  ?Vitals:  ? 01/09/22 0102 01/09/22 0753 01/09/22 0758 01/09/22 0817  ?BP: 131/69 112/61 117/63 107/60  ?Pulse: 99 94 88 90  ?Resp: (!) 22 (!) 22 17 18   ?Temp:    97.7 ?F (36.5 ?C)  ?TempSrc:    Oral  ?SpO2: 97% 95% 93% 93%  ?Weight:      ?Height:      ? ? ?Intake/Output Summary (Last 24 hours) at 01/09/2022 0845 ?Last data filed at 01/09/2022 03/11/2022 ?Gross per 24 hour  ?Intake 2338.98 ml  ?Output --  ?Net 2338.98 ml  ? ? ?  01/08/2022  ?  6:31 PM 01/08/2022  ?  3:52 AM 08/03/2018  ?  2:23 PM  ?Last 3 Weights  ?Weight (lbs) 118 lb 2.7 oz 119 lb 128 lb  ?Weight (kg) 53.6 kg 53.978 kg 58.06 kg  ?   ? ?Telemetry  ?  ?Sinus Rhythm - Personally Reviewed ? ?ECG  ?  ?No new tracing ? ?Physical Exam  ? ?GEN: No acute distress.   ?Neck: No JVD ?Cardiac: RRR, no murmurs, rubs, or gallops.  ?Respiratory: Clear to auscultation bilaterally. ?GI: Soft, nontender, non-distended  ?MS: No edema; No deformity. TR band in place ?Neuro:  Nonfocal  ?Psych: Normal affect  ? ?Labs  ?  ?High Sensitivity Troponin:   ?Recent Labs  ?Lab 01/07/22 ?2234 01/08/22 ?0105  ?TROPONINIHS 523* 1,396*  ?   ?Chemistry ?Recent Labs  ?Lab 01/07/22 ?2234  ?NA 138  ?K 3.5  ?CL 105  ?CO2 23  ?GLUCOSE 159*  ?BUN 18  ?CREATININE 0.83  ?CALCIUM 9.4  ?PROT 6.7  ?ALBUMIN 4.0  ?AST 14*   ?ALT 13  ?ALKPHOS 67  ?BILITOT 0.5  ?GFRNONAA >60  ?ANIONGAP 10  ?  ?Lipids  ?Recent Labs  ?Lab 01/08/22 ?0251  ?CHOL 144  ?TRIG 25  ?HDL 58  ?LDLCALC 81  ?CHOLHDL 2.5  ?  ?Hematology ?Recent Labs  ?Lab 01/07/22 ?2234 01/08/22 ?0425 01/09/22 ?0503  ?WBC 10.2 10.8* 6.2  ?RBC 4.25 4.00 4.08  ?HGB 13.7 13.3 13.2  ?HCT 41.1 37.9 38.8  ?MCV 96.7 94.8 95.1  ?MCH 32.2 33.3 32.4  ?MCHC 33.3 35.1 34.0  ?RDW 12.1 12.2 12.4  ?PLT 245 261 212  ? ?Thyroid No results for input(s): TSH, FREET4 in the last 168 hours.  ?BNP ?Recent Labs  ?Lab 01/08/22 ?0251  ?BNP 162.1*  ?  ?DDimer No results for input(s): DDIMER in the last 168 hours.  ? ?Radiology  ?  ?DG Chest 2 View ? ?Result Date: 01/07/2022 ?CLINICAL DATA:  Chest pain. EXAM: CHEST - 2 VIEW COMPARISON:  Chest x-ray 11/13/2015 FINDINGS: The heart size and mediastinal contours are within normal limits. Both lungs are clear. Cervical spinal fusion plate is  present. No acute fractures are seen. IMPRESSION: No active cardiopulmonary disease. Electronically Signed   By: Darliss CheneyAmy  Guttmann M.D.   On: 01/07/2022 22:51  ? ?CARDIAC CATHETERIZATION ? ?Result Date: 01/09/2022 ?1.  Widely patent coronary arteries with mild nonobstructive LAD plaquing (right dominant coronary circulation) 2.  Normal LVEDP Clinical presentation and cardiac catheterization findings consistent with acute Takotsubo syndrome.   ? ?ECHOCARDIOGRAM COMPLETE ? ?Result Date: 01/08/2022 ?   ECHOCARDIOGRAM REPORT   Patient Name:   Biagio QuintCARYOL Recchia Date of Exam: 01/08/2022 Medical Rec #:  [id number]      Height:       61.0 in Accession #:    [id number]     Weight:       119.0 lb Date of Birth:  1941-02-13      BSA:          1.515 m? Patient Age:    80 years       BP:           108/61 mmHg Patient Gender: F              HR:           79 bpm. Exam Location:  Inpatient Procedure: 2D Echo, Cardiac Doppler, Color Doppler and Intracardiac            Opacification Agent Indications:    NSTEMI  History:        Patient has prior history of  Echocardiogram examinations.  Sonographer:    Cleatis Polkaaylor Peper Referring Phys: [id number] CHRISTOPHER A WROBEL IMPRESSIONS  1. Left ventricular ejection fraction, by estimation, is 25 to 30%. The left ventricle has severely decreased function. The left ventricle demonstrates regional wall motion abnormalities (see scoring diagram/findings for description). There is mild left  ventricular hypertrophy. Left ventricular diastolic parameters are consistent with Grade II diastolic dysfunction (pseudonormalization). Elevated left atrial pressure.  2. Right ventricular systolic function is normal. The right ventricular size is normal. There is moderately elevated pulmonary artery systolic pressure. The estimated right ventricular systolic pressure is 48.7 mmHg.  3. Left atrial size was moderately dilated.  4. The mitral valve is normal in structure. No evidence of mitral valve regurgitation. No evidence of mitral stenosis.  5. Tricuspid valve regurgitation is mild to moderate.  6. The aortic valve is tricuspid. Aortic valve regurgitation is not visualized. Aortic valve sclerosis is present, with no evidence of aortic valve stenosis.  7. The inferior vena cava is normal in size with <50% respiratory variability, suggesting right atrial pressure of 8 mmHg. Conclusion(s)/Recommendation(s): Normal systolic function at base with mid to apical akinesis. 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 FINDINGS  Left Ventricle: Left ventricular ejection fraction, by estimation, is 25 to 30%. The left ventricle has severely decreased function. The left ventricle demonstrates regional wall motion abnormalities. The left ventricular internal cavity size was normal  in size. There is mild left ventricular hypertrophy. Left ventricular diastolic parameters are consistent with Grade II diastolic dysfunction (pseudonormalization). Elevated left atrial pressure.  LV Wall Scoring: The mid and distal anterior wall, mid and distal  lateral wall, mid and distal anterior septum, entire apex, mid and distal inferior wall, mid anterolateral segment, and mid inferoseptal segment are akinetic. The basal anteroseptal segment, basal inferolateral segment, basal anterolateral segment, basal anterior segment, basal inferior segment, and basal inferoseptal segment are normal. Right Ventricle: The right ventricular size is normal. No increase in right ventricular wall thickness. Right ventricular systolic function is normal. There is moderately elevated  pulmonary artery systolic pressure. The tricuspid regurgitant velocity is 3.19 m/s, and with an assumed right atrial pressure of 8 mmHg, the estimated right ventricular systolic pressure is 48.7 mmHg. Left Atrium: Left atrial size was moderately dilated. Right Atrium: Right atrial size was normal in size. Pericardium: There is no evidence of pericardial effusion. Mitral Valve: The mitral valve is normal in structure. No evidence of mitral valve regurgitation. No evidence of mitral valve stenosis. Tricuspid Valve: The tricuspid valve is normal in structure. Tricuspid valve regurgitation is mild to moderate. Aortic Valve: The aortic valve is tricuspid. Aortic valve regurgitation is not visualized. Aortic valve sclerosis is present, with no evidence of aortic valve stenosis. Aortic valve peak gradient measures 5.3 mmHg. Pulmonic Valve: The pulmonic valve was not well visualized. Pulmonic valve regurgitation is not visualized. Aorta: The aortic root and ascending aorta are structurally normal, with no evidence of dilitation. Venous: The inferior vena cava is normal in size with less than 50% respiratory variability, suggesting right atrial pressure of 8 mmHg. IAS/Shunts: The interatrial septum was not well visualized.  LEFT VENTRICLE PLAX 2D LVIDd:         3.80 cm      Diastology LVIDs:         2.80 cm      LV e' medial:    5.43 cm/s LV PW:         1.00 cm      LV E/e' medial:  16.0 LV IVS:        1.30 cm       LV e' lateral:   6.31 cm/s LVOT diam:     2.00 cm      LV E/e' lateral: 13.8 LV SV:         50 LV SV Index:   33 LVOT Area:     3.14 cm?  LV Volumes (MOD) LV vol d, MOD A2C: 102.0 ml LV vol d, MOD A4C: 9

## 2022-01-09 NOTE — Interval H&P Note (Signed)
History and Physical Interval Note: ? ?01/09/2022 ?7:33 AM ? ?[name] Trujillo  has presented today for surgery, with the diagnosis of nstemi.  The various methods of treatment have been discussed with the patient and family. After consideration of risks, benefits and other options for treatment, the patient has consented to  Procedure(s): ?LEFT HEART CATH AND CORONARY ANGIOGRAPHY (N/A) as a surgical intervention.  The patient's history has been reviewed, patient examined, no change in status, stable for surgery.  I have reviewed the patient's chart and labs.  Questions were answered to the patient's satisfaction.   ? ? ?Sherren Mocha ? ? ?

## 2022-01-09 NOTE — Progress Notes (Signed)
CARDIAC REHAB PHASE I  ? ?Began MI/HF education with pt. Pt educated on importance of daily weights and monitoring symptoms. Reviewed site care, restrictions, and exercise guidelines. Will f/u tomorrow to ambulate and complete education. ? ?[id number] ?Rufina Falco, RN BSN ?01/09/2022 ?3:10 PM ? ?

## 2022-01-09 NOTE — Care Management (Signed)
?  Transition of Care (TOC) Screening Note ? ? ?Patient Details  ?Name: [name] Trujillo ?Date of Birth: [birth year] ? ? ?Transition of Care (TOC) CM/SW Contact:    ?Graves-Bigelow, Ocie Cornfield, RN ?Phone Number: ?01/09/2022, 12:33 PM ? ? ? ?Transition of Care Department [facility]) has reviewed the patient and no TOC needs have been identified at this time. We will continue to monitor patient advancement through interdisciplinary progression rounds. If new patient transition needs arise, please place a TOC consult. ? ? ?

## 2022-01-10 ENCOUNTER — Other Ambulatory Visit (HOSPITAL_COMMUNITY): Payer: Self-pay

## 2022-01-10 ENCOUNTER — Ambulatory Visit: Payer: Medicare PPO | Admitting: Physical Therapy

## 2022-01-10 DIAGNOSIS — R778 Other specified abnormalities of plasma proteins: Secondary | ICD-10-CM | POA: Diagnosis not present

## 2022-01-10 DIAGNOSIS — I5181 Takotsubo syndrome: Secondary | ICD-10-CM | POA: Diagnosis not present

## 2022-01-10 DIAGNOSIS — E039 Hypothyroidism, unspecified: Secondary | ICD-10-CM

## 2022-01-10 DIAGNOSIS — E7849 Other hyperlipidemia: Secondary | ICD-10-CM

## 2022-01-10 DIAGNOSIS — E785 Hyperlipidemia, unspecified: Secondary | ICD-10-CM

## 2022-01-10 DIAGNOSIS — I248 Other forms of acute ischemic heart disease: Secondary | ICD-10-CM | POA: Diagnosis not present

## 2022-01-10 DIAGNOSIS — R7989 Other specified abnormal findings of blood chemistry: Secondary | ICD-10-CM

## 2022-01-10 LAB — BASIC METABOLIC PANEL
Anion gap: 7 (ref 5–15)
BUN: 10 mg/dL (ref 8–23)
CO2: 23 mmol/L (ref 22–32)
Calcium: 9.4 mg/dL (ref 8.9–10.3)
Chloride: 110 mmol/L (ref 98–111)
Creatinine, Ser: 0.83 mg/dL (ref 0.44–1.00)
GFR, Estimated: >60 mL/min (ref >60)
Glucose, Bld: 159 mg/dL — ABNORMAL HIGH (ref 70–99)
Potassium: 3.5 mmol/L (ref 3.5–5.1)
Sodium: 140 mmol/L (ref 135–145)

## 2022-01-10 MED ORDER — LOSARTAN POTASSIUM 25 MG PO TABS
12.5000 mg | ORAL_TABLET | Freq: Every day | ORAL | Status: DC
  Administered 2022-01-10: 12.5 mg via ORAL
  Filled 2022-01-10: qty 1

## 2022-01-10 MED ORDER — SPIRONOLACTONE 25 MG PO TABS
12.5000 mg | ORAL_TABLET | Freq: Every day | ORAL | 0 refills | Status: DC
  Filled 2022-01-10: qty 45, 90d supply, fill #0

## 2022-01-10 MED ORDER — METOPROLOL SUCCINATE ER 25 MG PO TB24
25.0000 mg | ORAL_TABLET | Freq: Every day | ORAL | 0 refills | Status: DC
  Filled 2022-01-10: qty 90, 90d supply, fill #0

## 2022-01-10 MED ORDER — LOSARTAN POTASSIUM 25 MG PO TABS
12.5000 mg | ORAL_TABLET | Freq: Every day | ORAL | 0 refills | Status: DC
  Filled 2022-01-10: qty 45, 90d supply, fill #0

## 2022-01-10 NOTE — Progress Notes (Signed)
CARDIAC REHAB PHASE I  ? ?PRE:  Rate/Rhythm: 84 SR ? ?BP:  Sitting: 116/73     ? ?SaO2: 95 RA ? ?MODE:  Ambulation: 200 ft  ? ?POST:  Rate/Rhythm: 109 SR ? ?BP:  Sitting: 128/60 ? ?  SaO2: 97 RA ? ?Pt ambulated 213ft in hallway assist of one. Pt c/o leg weakness, but denies CP, SOB, or dizziness. Pt given low sodium diets and HF booklet. Reviewed importance of daily weights, site care, restrictions, and exercise guidelines. Will refer to CRP II [place] to meet the requirement, pt currently participating in outpatient physical therapy. ? ?[phone] ?Reynold Bowen, RN BSN ?01/10/2022 ?8:51 AM ? ?

## 2022-01-10 NOTE — Discharge Summary (Addendum)
?Discharge Summary  ?  ?Patient ID: [name] Trujillo ?MRN: [id number]; DOB: 1940/11/26 ? ?Admit date: 01/07/2022 ?Discharge date: 01/10/2022 ? ?PCP:  Mosetta Putt, MD ?  ?[facility] Providers ?Cardiologist:  Bryan Lemma, MD    ? ?Discharge Diagnoses  ?  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  Elevated troponin ?  Hyperlipidemia ?  Hypothyroid ? ? ?Diagnostic Studies/Procedures  ?  ?Cath: 01/09/22 ?  ?1.  Widely patent coronary arteries with mild nonobstructive LAD plaquing (right dominant coronary circulation) ?2.  Normal LVEDP ? ?Clinical presentation and cardiac catheterization findings consistent with acute Takotsubo syndrome.   ?  ?Echo: 01/08/22 ? ? ? 1. Left ventricular ejection fraction, by estimation, is 25 to 30%. The  ?left ventricle has severely decreased function. The left ventricle  ?demonstrates regional wall motion abnormalities (see scoring  ?diagram/findings for description). There is mild left  ? ventricular hypertrophy. Left ventricular diastolic parameters are  ?consistent with Grade II diastolic dysfunction (pseudonormalization).  ?Elevated left atrial pressure.  ? 2. Right ventricular systolic function is normal. The right ventricular  ?size is normal. There is moderately elevated pulmonary artery systolic  ?pressure. The estimated right ventricular systolic pressure is 48.7 mmHg.  ? 3. Left atrial size was moderately dilated.  ? 4. The mitral valve is normal in structure. No evidence of mitral valve  ?regurgitation. No evidence of mitral stenosis.  ? 5. Tricuspid valve regurgitation is mild to moderate.  ? 6. The aortic valve is tricuspid. Aortic valve regurgitation is not  ?visualized. Aortic valve sclerosis is present, with no evidence of aortic  ?valve stenosis.  ? 7. The inferior vena cava is normal in size with <50% respiratory  ?variability, suggesting right atrial pressure of 8 mmHg.  ? ?Conclusion(s)/Recommendation(s): Normal systolic function at base with  mid  ?to apical akinesis. If obstructive CAD ruled out, consider Takotsubo  ?cardiomyopathy.  ? ?FINDINGS  ? Left Ventricle: Left ventricular ejection fraction, by estimation, is 25  ?to 30%. The left ventricle has severely decreased function. The left  ?ventricle demonstrates regional wall motion abnormalities. The left  ?ventricular internal cavity size was normal  ? in size. There is mild left ventricular hypertrophy. Left ventricular  ?diastolic parameters are consistent with Grade II diastolic dysfunction  ?(pseudonormalization). Elevated left atrial pressure.  ? ?   ?LV Wall Scoring:  ?The mid and distal anterior wall, mid and distal lateral wall, mid and  ?distal  ?anterior septum, entire apex, mid and distal inferior wall, mid  ?anterolateral  ?segment, and mid inferoseptal segment are akinetic. The basal anteroseptal  ?segment, basal inferolateral segment, basal anterolateral segment, basal  ?anterior segment, basal inferior segment, and basal inferoseptal segment  ?are  ?normal.  ? ?Right Ventricle: The right ventricular size is normal. No increase in  ?right ventricular wall thickness. Right ventricular systolic function is  ?normal. There is moderately elevated pulmonary artery systolic pressure.  ?The tricuspid regurgitant velocity is  ?3.19 m/s, and with an assumed right atrial pressure of 8 mmHg, the  ?estimated right ventricular systolic pressure is 48.7 mmHg.  ? ?Left Atrium: Left atrial size was moderately dilated.  ? ?Right Atrium: Right atrial size was normal in size.  ? ?Pericardium: There is no evidence of pericardial effusion.  ? ?Mitral Valve: The mitral valve is normal in structure. No evidence of  ?mitral valve regurgitation. No evidence of mitral valve stenosis.  ? ?Tricuspid Valve: The tricuspid valve is normal in structure. Tricuspid  ?valve regurgitation is mild  to moderate.  ? ?Aortic Valve: The aortic valve is tricuspid. Aortic valve regurgitation is  ?not visualized. Aortic valve  sclerosis is present, with no evidence of  ?aortic valve stenosis. Aortic valve peak gradient measures 5.3 mmHg.  ? ?Pulmonic Valve: The pulmonic valve was not well visualized. Pulmonic valve  ?regurgitation is not visualized.  ? ?Aorta: The aortic root and ascending aorta are structurally normal, with  ?no evidence of dilitation.  ? ?Venous: The inferior vena cava is normal in size with less than 50%  ?respiratory variability, suggesting right atrial pressure of 8 mmHg.  ? ?IAS/Shunts: The interatrial septum was not well visualized.  ?_____________ ?  ?History of Present Illness   ?  ?[name] Trujillo is a 81 y.o. female with HLD and hypothyroidism  who presented with chest pain for NSTEMI. [name] Trujillo had no known cardiac history. She was referred for an echo in 2017 for a murmur, but had normal biventricular function and no significant valvular disease.  ?  ?The evening of admission, [name] Trujillo was at home while her husband was taking out the trash.  He slipped and fell on his way back up the driveway, and landed on his face.  Fortunately [name] Trujillo's neighbor was visiting them and went to check on her husband.  She brought him back up to the house and unfortunately was bleeding.  They decided to call 911 to bring her husband to [facility] in for evaluation.  Around this time she reports that she suddenly developed upper chest "tightness."  She had never experienced this sensation before.  This was associated with significant nausea.  She denied any shortness of breath, jaw pain, arm pain.  She got in the car with her neighbor and in route her chest pain remained unchanged.  Her nausea progressed and she vomited just outside the emergency department here.  When she came inside she went to the bathroom and vomited a few more times.  She was seen by one of the nursing staff and it was recommended that she be evaluated.  At baseline [name] Trujillo walks at least a half a mile per day with no chest pain or shortness of  breath. ?  ?Her initial EKG was sinus with no ST changes.  She was slightly tachycardic but otherwise hemodynamically stable.  Her initial troponin was highly elevated at 523 and then quickly rose to 1396.  She was loaded with aspirin and started on a heparin drip.  She was given nitroglycerin in the emergency department which largely resolved her chest discomfort. ?  ? ?Hospital Course  ?   ?Elevated Troponin/demand ischemia: hsTn 523>>1396, treated with IV heparin. Underwent cardiac cath with no obstructive CAD. Recommendations for medical management. ?  ?HFrEF/NICM/stress-induced/Takotsubo cardiomyopathy: Echo showed LVEF of 25-30% with pattern suggestive of Takotsubo cardiomyopathy.  ?-- Switched to Toprol 25mg  daily, add spiro 12.5mg  daily and losartan 12.5mg  daily prior to discharge. LVEDP normal on cath ?-- seen by CR and ambulated without symptoms ? ?Hypothyroidism: on synthroid ?  ?HLD: LDL 81 ?-- started on atorvastatin 80mg  daily on admission with presumed ACS, will switch back to Crestor 10mg  daily at DC ?  ?General: Well developed, well nourished, female appearing in no acute distress. ?Head: Normocephalic, atraumatic.  ?Neck: Supple without bruits, JVD. ?Lungs:  Resp regular and unlabored, CTA. ?Heart: RRR, S1, S2, no S3, S4, or murmur; no rub. ?Abdomen: Soft, non-tender, non-distended with normoactive bowel sounds. No hepatomegaly. No rebound/guarding. No obvious abdominal masses. ?Extremities: No clubbing, cyanosis,  edema. Distal pedal pulses are 2+ bilaterally. Right radial cath site stable without significant bruising or hematoma ?Neuro: Alert and oriented X 3. Moves all extremities spontaneously. ?Psych: Normal affect. ? ?Patient seen by Dr. Herbie BaltimoreHarding and deemed stable for discharge home. Follow up in the office has been arranged. [facility] Educated by pharmD prior to discharge.  ? ?Did the patient have an acute coronary syndrome (MI, NSTEMI, STEMI, etc) this admission?:   No.   ?The elevated Troponin was due to the acute medical illness (demand ischemia).  ? ?  ?The patient will be scheduled for a TOC follow up appointment in 10-14 days.  [facility]

## 2022-01-15 ENCOUNTER — Ambulatory Visit: Payer: Medicare PPO | Admitting: Physical Therapy

## 2022-01-17 ENCOUNTER — Encounter: Payer: Medicare PPO | Admitting: Physical Therapy

## 2022-01-18 ENCOUNTER — Telehealth (HOSPITAL_COMMUNITY): Payer: Self-pay

## 2022-01-18 ENCOUNTER — Encounter: Payer: Self-pay | Admitting: Physician Assistant

## 2022-01-18 ENCOUNTER — Ambulatory Visit: Payer: Medicare PPO | Admitting: Physician Assistant

## 2022-01-18 VITALS — BP 128/68 | HR 92 | Ht 61.0 in | Wt 122.4 lb

## 2022-01-18 DIAGNOSIS — I5181 Takotsubo syndrome: Secondary | ICD-10-CM | POA: Diagnosis not present

## 2022-01-18 DIAGNOSIS — E7849 Other hyperlipidemia: Secondary | ICD-10-CM | POA: Diagnosis not present

## 2022-01-18 DIAGNOSIS — I214 Non-ST elevation (NSTEMI) myocardial infarction: Secondary | ICD-10-CM

## 2022-01-18 DIAGNOSIS — I2510 Atherosclerotic heart disease of native coronary artery without angina pectoris: Secondary | ICD-10-CM

## 2022-01-18 DIAGNOSIS — E039 Hypothyroidism, unspecified: Secondary | ICD-10-CM

## 2022-01-18 NOTE — Telephone Encounter (Signed)
Per phase I cardiac rehab, fax cardiac rehab referral to [place] [facility]. 

## 2022-01-18 NOTE — Progress Notes (Signed)
?Cardiology Office Note:   ? ?Date:  01/18/2022  ? ?IDCorean Trujillo, DOB 1941-05-15, MRN [id number] ? ?PCP:  Mosetta Putt, MD ?  ?[facility] Providers ?Cardiologist:  Bryan Lemma, MD ?Cardiology APP:  Marcelino Duster, [place] { ?Referring MD: Mosetta Putt, MD  ? ?Chief Complaint  ?Patient presents with  ? Follow-up  ? ? ?History of Present Illness:   ? ?[name] Trujillo is a 81 y.o. 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  Prior to admission, she remained active walking at least a half a mile per day.  She developed sudden chest pain after witnessing her husband fall on the driveway.  Troponin elevated from [phone].  Heart catheterization revealed no obstructive CAD, recommendations for medical management.  Echocardiogram revealed an LVEF 25 to 30% in a pattern suggestive of Takotsubo cardiomyopathy.  GDMT was titrated to include 25 mg Toprol, 12.5 mg spironolactone, and 12.5 mg losartan at discharge.  LVEDP normal on cath.  Her home statin was continued: Crestor 10 mg.  LDL was 81.  She was discharged on 5//23. ? ?She presents for TOC follow-up. Her daughter is an Administrator, arts MD in [place] and has guided her BP log beautifully. BP running 100-110/40-50s. She is already walking with occasional chest twinges. Overall, no complaints. We discussed course and next steps. I encouraged cardiac rehab. She is doing great, as is her husband.  ? ? ?Past Medical History:  ?Diagnosis Date  ? High cholesterol   ? Hypothyroidism   ? Neuropathy   ? ? ?Past Surgical History:  ?Procedure Laterality Date  ? APPENDECTOMY    ? 51-20 yo  ? BACK SURGERY    ? Age 23  ? CESAREAN SECTION    ? ECTOPIC PREGNANCY SURGERY    ? LAPAROSCOPIC HYSTERECTOMY    ? LEFT HEART CATH AND CORONARY ANGIOGRAPHY N/A 01/09/2022  ? Procedure: LEFT HEART CATH AND CORONARY ANGIOGRAPHY;  Surgeon: Tonny Bollman, MD;  Location: [facility] INVASIVE CV LAB;  Service: Cardiovascular;   Laterality: N/A;  ? ? ?Current Medications: ?Current Meds  ?Medication Sig  ? acetaminophen (TYLENOL) 325 MG tablet Take 975 mg by mouth daily as needed for moderate pain.  ? Carboxymethylcellulose Sodium (REFRESH TEARS OP) Place 2 drops into both eyes 3 (three) times daily.  ? levothyroxine (SYNTHROID) 88 MCG tablet Take 88 mcg by mouth daily before breakfast.  ? losartan (COZAAR) 25 MG tablet Take 1/2 tablet (12.5 mg total) by mouth daily.  ? metoprolol succinate (TOPROL-XL) 25 MG 24 hr tablet Take 1 tablet (25 mg total) by mouth daily.  ? rosuvastatin (CRESTOR) 10 MG tablet Take 10 mg by mouth at bedtime.  ? solifenacin (VESICARE) 10 MG tablet Take 10 mg by mouth daily.  ? spironolactone (ALDACTONE) 25 MG tablet Take 1/2 tablet (12.5 mg total) by mouth daily.  ? vitamin B-12 (CYANOCOBALAMIN) 1000 MCG tablet Take 1,000 mcg by mouth daily.  ?  ? ?Allergies:   Oxycodone hcl, Primidone, Propranolol, Tolterodine, and Hydrocodone  ? ?Social History  ? ?Socioeconomic History  ? Marital status: Married  ?  Spouse name: Not on file  ? Number of children: 7  ? Years of education: 60  ? Highest education level: Bachelor's degree (e.g., BA, AB, BS)  ?Occupational History  ? Occupation: Retired  ?Tobacco Use  ? Smoking status: Never  ? Smokeless tobacco: Never  ?Substance and Sexual Activity  ? Alcohol use: Yes  ?  Comment: once or  twice a month  ? Drug use: No  ? Sexual activity: Not on file  ?Other Topics Concern  ? Not on file  ?Social History Narrative  ? Lives at home w/ her husband   ? Right-handed  ? Caffeine: 2 cups of coffee in the morning  ? ?Social Determinants of Health  ? ?Financial Resource Strain: Not on file  ?Food Insecurity: Not on file  ?Transportation Needs: Not on file  ?Physical Activity: Not on file  ?Stress: Not on file  ?Social Connections: Not on file  ?  ? ?Family History: ?The patient's family history includes Alcohol abuse in her father; Heart attack in her father; Stroke in her mother. There is  no history of Neuropathy. ? ?ROS:   ?Please see the history of present illness.    ? All other systems reviewed and are negative. ? ?EKGs/Labs/Other Studies Reviewed:   ? ?The following studies were reviewed today: ? ?Cath: 01/09/22 ?  ?1.  Widely patent coronary arteries with mild nonobstructive LAD plaquing (right dominant coronary circulation) ?2.  Normal LVEDP ? ?Clinical presentation and cardiac catheterization findings consistent with acute Takotsubo syndrome.   ?  ? ?Echo: 01/08/22 ? 1. Left ventricular ejection fraction, by estimation, is 25 to 30%. The  ?left ventricle has severely decreased function. The left ventricle  ?demonstrates regional wall motion abnormalities (see scoring  ?diagram/findings for description). There is mild left  ? ventricular hypertrophy. Left ventricular diastolic parameters are  ?consistent with Grade II diastolic dysfunction (pseudonormalization).  ?Elevated left atrial pressure.  ? 2. Right ventricular systolic function is normal. The right ventricular  ?size is normal. There is moderately elevated pulmonary artery systolic  ?pressure. The estimated right ventricular systolic pressure is 48.7 mmHg.  ? 3. Left atrial size was moderately dilated.  ? 4. The mitral valve is normal in structure. No evidence of mitral valve  ?regurgitation. No evidence of mitral stenosis.  ? 5. Tricuspid valve regurgitation is mild to moderate.  ? 6. The aortic valve is tricuspid. Aortic valve regurgitation is not  ?visualized. Aortic valve sclerosis is present, with no evidence of aortic  ?valve stenosis.  ? 7. The inferior vena cava is normal in size with <50% respiratory  ?variability, suggesting right atrial pressure of 8 mmHg.   ? ?EKG:  EKG is not ordered today.   ? ?Recent Labs: ?01/07/2022: ALT 13 ?01/08/2022: B Natriuretic Peptide 162.1 ?01/09/2022: Hemoglobin 13.2; Platelets 212 ?01/10/2022: BUN 10; Creatinine, Ser 0.83; Potassium 3.5; Sodium 140  ?Recent Lipid Panel ?   ?Component Value Date/Time  ?  CHOL 144 01/08/2022 0251  ? TRIG 25 01/08/2022 0251  ? HDL 58 01/08/2022 0251  ? CHOLHDL 2.5 01/08/2022 0251  ? VLDL 5 01/08/2022 0251  ? LDLCALC 81 01/08/2022 0251  ? ? ? ?Risk Assessment/Calculations:   ?  ? ?    ? ?Physical Exam:   ? ?VS:  BP 128/68   Pulse 92   Ht 5\' 1"  (1.549 m)   Wt 122 lb 6.4 oz (55.5 kg)   SpO2 98%   BMI 23.13 kg/m?    ? ?Wt Readings from Last 3 Encounters:  ?01/18/22 122 lb 6.4 oz (55.5 kg)  ?01/08/22 118 lb 2.7 oz (53.6 kg)  ?08/03/18 128 lb (58.1 kg)  ?  ? ?GEN:  Well nourished, well developed in no acute distress ?HEENT: Normal ?NECK: No JVD; No carotid bruits ?LYMPHATICS: No lymphadenopathy ?CARDIAC: RRR, no murmurs, rubs, gallops ?RESPIRATORY:  Clear to auscultation without rales, wheezing  or rhonchi  ?ABDOMEN: Soft, non-tender, non-distended ?MUSCULOSKELETAL:  No edema; No deformity  ?SKIN: Warm and dry ?NEUROLOGIC:  Alert and oriented x 3 ?PSYCHIATRIC:  Normal affect  ?Radial cath site C/D/I with expected bruising ? ?ASSESSMENT:   ? ?1. Takotsubo cardiomyopathy   ?2. NSTEMI (non-ST elevated myocardial infarction) (HCC)   ?3. Coronary artery disease involving native heart without angina pectoris, unspecified vessel or lesion type   ?4. Other hyperlipidemia   ?5. Hypothyroidism, unspecified type   ? ?PLAN:   ? ?In order of problems listed above: ? ?Nonobstructive CAD ?NSTEMI 01/08/2022 ?Risk factor modification ? ? ?Hyperlipidemia with LDL goal less than 70 ?01/08/2022: Cholesterol 144; HDL 58; LDL Cholesterol 81; Triglycerides 25; VLDL 5 ?Maintained on 10 mg Crestor ?She is increasing activity. Recheck in 3 months, may need to increase to 20 mg crestor.  ? ? ?Takotsubo cardiomyopathy ?LVEF was 25 to 30% ?GDMT: 25 mg Toprol, 12.5 mg spironolactone, 12.5 mg losartan ?We will obtain BMP today ?BP well controlled in the 100-110s - no room to titrate medications today. She will call if SBP consistently above 120 and I will increase losartan. Repeat an echocardiogram in 2 months with appt  right after.  ? ? ?Hypothyroidism ?Continue Synthroid ? ? ?Follow up in 2 months after echo.    ? ? ?Medication Adjustments/Labs and Tests Ordered: ?Current medicines are reviewed at length with the patient today.  Co

## 2022-01-18 NOTE — Patient Instructions (Signed)
Medication Instructions:  ?Your physician recommends that you continue on your current medications as directed. Please refer to the Current Medication list given to you today. ? ?*If you need a refill on your cardiac medications before your next appointment, please call your pharmacy* ? ?Lab Work: ?Your physician recommends that you return for lab work TODAY:  ?BMET ?If you have labs (blood work) drawn today and your tests are completely normal, you will receive your results only by: ?MyChart Message (if you have MyChart) OR ?A paper copy in the mail ?If you have any lab test that is abnormal or we need to change your treatment, we will call you to review the results. ? ?Testing/Procedures: ?Your physician has requested that you have an echocardiogram. Echocardiography is a painless test that uses sound waves to create images of your heart. It provides your doctor with information about the size and shape of your heart and how well your heart?s chambers and valves are working. This procedure takes approximately one hour. There are no restrictions for this procedure. This test is performed at [address], [place], [place] [address] ? ?Please schedule for 2 months  ? ?Follow-Up: ?At [facility], you and your health needs are our priority.  As part of our continuing mission to provide you with exceptional heart care, we have created designated Provider Care Teams.  These Care Teams include your primary Cardiologist (physician) and Advanced Practice Providers (APPs -  Physician Assistants and Nurse Practitioners) who all work together to provide you with the care you need, when you need it. ? ?We recommend signing up for the patient portal called "MyChart".  Sign up information is provided on this After Visit Summary.  MyChart is used to connect with patients for Virtual Visits (Telemedicine).  Patients are able to view lab/test results, encounter notes, upcoming appointments, etc.  Non-urgent messages can  be sent to your provider as well.   ?To learn more about what you can do with MyChart, go to [email or web address].   ? ?Your next appointment:   ?2 month(s) after Echo ? ?The format for your next appointment:   ?In Person ? ?Provider:   ?Glenetta Hew, MD  or Fabian Sharp, PA-C      ? ? ?Other Instructions ? ? ?Important Information About Sugar ? ? ? ? ? ? ?

## 2022-01-19 LAB — BASIC METABOLIC PANEL
BUN/Creatinine Ratio: 21 (ref 12–28)
BUN: 18 mg/dL (ref 8–27)
CO2: 25 mmol/L (ref 20–29)
Calcium: 10.6 mg/dL — ABNORMAL HIGH (ref 8.7–10.3)
Chloride: 101 mmol/L (ref 96–106)
Creatinine, Ser: 0.87 mg/dL (ref 0.57–1.00)
Glucose: 84 mg/dL (ref 70–99)
Potassium: 5 mmol/L (ref 3.5–5.2)
Sodium: 140 mmol/L (ref 134–144)
eGFR: 68 mL/min/{1.73_m2} (ref >59)

## 2022-01-21 ENCOUNTER — Telehealth: Payer: Self-pay | Admitting: Physician Assistant

## 2022-01-21 NOTE — Addendum Note (Signed)
Addended by: Minette Brine on: 01/21/2022 11:52 AM ? ? Modules accepted: Orders ? ?

## 2022-01-21 NOTE — Telephone Encounter (Signed)
Called pt, went over her lab results and the recommendations to have labs redrawn. She verbalized understanding, no further questions at this time.  ?

## 2022-01-21 NOTE — Telephone Encounter (Signed)
Patient returning call for lab results. 

## 2022-01-22 ENCOUNTER — Encounter: Payer: Self-pay | Admitting: Physical Therapy

## 2022-01-22 ENCOUNTER — Ambulatory Visit: Payer: Medicare PPO | Attending: Registered" | Admitting: Physical Therapy

## 2022-01-22 DIAGNOSIS — R2681 Unsteadiness on feet: Secondary | ICD-10-CM

## 2022-01-22 DIAGNOSIS — M21372 Foot drop, left foot: Secondary | ICD-10-CM | POA: Diagnosis present

## 2022-01-22 DIAGNOSIS — M5416 Radiculopathy, lumbar region: Secondary | ICD-10-CM | POA: Diagnosis present

## 2022-01-22 DIAGNOSIS — M21371 Foot drop, right foot: Secondary | ICD-10-CM | POA: Diagnosis present

## 2022-01-22 DIAGNOSIS — M6281 Muscle weakness (generalized): Secondary | ICD-10-CM | POA: Diagnosis present

## 2022-01-22 DIAGNOSIS — R2689 Other abnormalities of gait and mobility: Secondary | ICD-10-CM

## 2022-01-22 NOTE — Therapy (Signed)
[facility] ?[address]  [address] ?[place], [place], [address] ?Phone: [phone]   Fax:  [phone] ? ?Physical Therapy Treatment ? ?Patient Details  ?Name: [name] Trujillo ?MRN: [id number] ?Date of Birth: 02/09/1941 ?Referring Provider (PT): Rondel Oh, NP for [name], MD ? ? ?Encounter Date: 01/22/2022 ? ? PT End of Session - 01/22/22 1312   ? ? Visit Number 6   ? Number of Visits 20   ? Date for PT Re-Evaluation 02/14/22   ? Authorization Type [employer]   ? Authorization Time Period 11/22/21 - 02/14/22   ? Authorization - Visit Number 5   ? Authorization - Number of Visits 10   ? PT Start Time 1312   ? PT Stop Time 1356   ? PT Time Calculation (min) 44 min   ? Activity Tolerance Patient tolerated treatment well   ? Behavior During Therapy [facility] for tasks assessed/performed   ? ?  ?  ? ?  ? ? ?Past Medical History:  ?Diagnosis Date  ? High cholesterol   ? Hypothyroidism   ? Neuropathy   ? ? ?Past Surgical History:  ?Procedure Laterality Date  ? APPENDECTOMY    ? 38-18 yo  ? BACK SURGERY    ? Age 55  ? CESAREAN SECTION    ? ECTOPIC PREGNANCY SURGERY    ? LAPAROSCOPIC HYSTERECTOMY    ? LEFT HEART CATH AND CORONARY ANGIOGRAPHY N/A 01/09/2022  ? Procedure: LEFT HEART CATH AND CORONARY ANGIOGRAPHY;  Surgeon: Tonny Bollman, MD;  Location: [facility] INVASIVE CV LAB;  Service: Cardiovascular;  Laterality: N/A;  ? ? ?There were no vitals filed for this visit. ? ? Subjective Assessment - 01/22/22 1351   ? ? Subjective Pt returning to PT after 3 wk absence after suffering a NSTEMI on 01/07/22 d/t Takotsubo cardiomyopathy after her husband suffered a fall in their driveway. She was released by her cardiologist as of 01/18/22 to resume PT but cautioned to "listen to her body". Her cardiologist wants her to participate in cardiac rehab but she does know when this will start. She has gradually resumed her walking for exercise but since the MI she has noted increased  sensation of her R foot drawing up on her, esp at night, which keeps her awake. She also notes increased LBP for a few days after being released from the hospital but states this seems to have resumed.   ? Patient Stated Goals "to walk with a more normal gait and feel steadier"   ? Currently in Pain? No/denies   ? ?  ?  ? ?  ? ? ? ? ? ? ? ? ? ? ? ? ? ? ? ? ? ? ? ? [facility] Adult PT Treatment/Exercise - 01/22/22 1312   ? ?  ? Manual Therapy  ? Manual Therapy Soft tissue mobilization;Myofascial release   ? Manual therapy comments skilled palpation and monitoring of soft tissue during DN   ? Soft tissue mobilization STM/DTM R anterior tibialis and peroneals   ? Myofascial Release pin and stretch to R anterior tibialis and peroneals   ?  ? Ankle Exercises: Stretches  ? Other Stretch Seated R anterior tibialis stretch 3 x 30 sec   ? Other Stretch Standing R peroneal stretch 3 x 30 sec   ? ?  ?  ? ?  ? ? ? Trigger Point Dry Needling - 01/22/22 1312   ? ? Consent Given? Yes   ? Education  Handout Provided Previously provided   Pt familiar with DN from a prior episode  ? Muscles Treated Lower Quadrant Anterior tibialis;Peroneals   ? Dry Needling Comments Right   ? Peroneals Response Twitch response elicited;Palpable increased muscle length   ? Anterior tibialis Response Twitch response elicited;Palpable increased muscle length   ? ?  ?  ? ?  ? ? ? ? ? ? ? ? ? ? PT Short Term Goals - 01/22/22 1355   ? ?  ? PT SHORT TERM GOAL #1  ? Title Patient will be independent with initial HEP   ? Status Achieved   01/22/22  ? Target Date 01/03/22   ?  ? PT SHORT TERM GOAL #2  ? Title Complete balance assessment an update LTGs as indicated   ? Status Achieved   11/27/21  ? ?  ?  ? ?  ? ? ? ? PT Long Term Goals - 12/04/21 1406   ? ?  ? PT LONG TERM GOAL #1  ? Title Patient will be independent with ongoing/advanced HEP for self-management at home   ? Status On-going   ? Target Date 02/14/22   ?  ? PT LONG TERM GOAL #2  ? Title Patient will  demonstrate improved B hip strength to >/= 4/5 and R ankle strength to >/= 3+/5 to 4-/5  for improved stability and ease of mobility   ? Status On-going   ? Target Date 02/14/22   ?  ? PT LONG TERM GOAL #3  ? Title Patient will ambulate with improved gait pattern with decreased B foot drop/slap and no evidence of instability/LOB   ? Status On-going   ? Target Date 02/14/22   ?  ? PT LONG TERM GOAL #4  ? Title Patient will report improved comfort with stair negotiation for safe community access   ? Status On-going   ? Target Date 02/14/22   ?  ? PT LONG TERM GOAL #5  ? Title Patient will report improved feeling of stability with working in her garden   ? Status On-going   ? Target Date 02/14/22   ?  ? PT LONG TERM GOAL #6  ? Title Patient will improve FGA score to >/= 21/30 to improve gait stability and reduce risk for falls   ? Baseline 17/30 (11/27/21)   ? Status On-going   ? Target Date 02/14/22   ? ?  ?  ? ?  ? ? ? ? ? ? ? ? Plan - 01/22/22 1356   ? ? Clinical Impression Statement [name] Trujillo is returning to PT after 3 wk absence after suffering a NSTEMI on 01/07/22 d/t Takotsubo cardiomyopathy - she was released by her cardiologist as of 01/18/22 to resume PT but cautioned to "listen to her body". She reports she was very sedentary initially upon returning home per MD orders but now has been gradually resuming her walking for exercise. Since resuming activity, she has noted a feeling of her R foot drawing up at night which has interfered with her sleeping. Increased muscle tension noted in R anterior tib, peroneals and lateral gastroc which was addressed with MT incorporating DN followed by instruction in relevant stretching for the associated muscles, with pt noting less tension in her R foot afterwards. Will re-assess response next visit and address any remaining concerns, then proceed with resumption of lumbopelvic strengthening and balance training per pt tolerance.   ? Comorbidities C5-7 ACDF 09/21/20; idiopathic  progressive neuropathy; hypthyroidism; DDD; remote h/o back  surgery at age 81; h/o scoliosis   ? Rehab Potential Good   ? PT Frequency 2x / week   ? PT Duration 12 weeks   8-12 weeks - due to scheduling availability, cert window will extend for 12 weeks  ? PT Treatment/Interventions ADLs/Self Care Home Management;Cryotherapy;Electrical Stimulation;Moist Heat;Ultrasound;DME Instruction;Gait training;Stair training;Functional mobility training;Therapeutic activities;Therapeutic exercise;Balance training;Neuromuscular re-education;Patient/family education;Manual techniques;Passive range of motion;Dry needling;Energy conservation;Taping;Vestibular;Spinal Manipulations;Joint Manipulations   ? PT Next Visit Plan re-assess abnormal muscle tension in R LE and treat as indicated; further review/update of HEP including core/lumbopelvic strengthening & balance activities   ? PT Home Exercise Plan Access Code: [email or web address]   ? Consulted and Agree with Plan of Care Patient   ? ?  ?  ? ?  ? ? ?Patient will benefit from skilled therapeutic intervention in order to improve the following deficits and impairments:  Abnormal gait, Decreased activity tolerance, Decreased balance, Decreased coordination, Decreased endurance, Decreased knowledge of use of DME, Decreased mobility, Decreased range of motion, Decreased safety awareness, Decreased strength, Difficulty walking, Increased fascial restricitons, Increased muscle spasms, Impaired perceived functional ability, Impaired flexibility, Impaired sensation, Dizziness, Impaired tone, Improper body mechanics, Postural dysfunction ? ?Visit Diagnosis: ?Radiculopathy, lumbar region ? ?Muscle weakness (generalized) ? ?Foot drop, left ? ?Foot drop, right ? ?Other abnormalities of gait and mobility ? ?Unsteadiness on feet ? ? ? ? ?Problem List ?Patient Active Problem List  ? Diagnosis Date Noted  ? Elevated troponin 01/10/2022  ? Takotsubo cardiomyopathy 01/10/2022  ? Hyperlipidemia 01/10/2022  ?  Hypothyroid 01/10/2022  ? NSTEMI (non-ST elevated myocardial infarction) (HCC) 01/08/2022  ? 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  ? Ventricular premature beats 10/31/2015  ? Heart murmur 10/31/2015  ? ? ?JoA

## 2022-01-24 ENCOUNTER — Ambulatory Visit: Payer: Medicare PPO | Admitting: Physical Therapy

## 2022-01-24 ENCOUNTER — Encounter: Payer: Self-pay | Admitting: Physical Therapy

## 2022-01-24 VITALS — BP 132/62 | HR 80

## 2022-01-24 DIAGNOSIS — M5416 Radiculopathy, lumbar region: Secondary | ICD-10-CM | POA: Diagnosis not present

## 2022-01-24 DIAGNOSIS — M6281 Muscle weakness (generalized): Secondary | ICD-10-CM

## 2022-01-24 DIAGNOSIS — M21372 Foot drop, left foot: Secondary | ICD-10-CM

## 2022-01-24 DIAGNOSIS — R2681 Unsteadiness on feet: Secondary | ICD-10-CM

## 2022-01-24 DIAGNOSIS — M21371 Foot drop, right foot: Secondary | ICD-10-CM

## 2022-01-24 DIAGNOSIS — R2689 Other abnormalities of gait and mobility: Secondary | ICD-10-CM

## 2022-01-24 NOTE — Therapy (Signed)
[facility] [address]  [place], [place], [address] Phone: [phone]   Fax:  [phone]  Physical Therapy Treatment  Patient Details  Name: [name] Trujillo MRN: [id number] Date of Birth: Jul 15, 1941 Referring Provider (PT): Dorena Dew, NP for [name], MD   Encounter Date: 01/24/2022   PT End of Session - 01/24/22 1315     Visit Number 7    Number of Visits 20    Date for PT Re-Evaluation 02/14/22    Authorization Type [employer]    Authorization Time Period 11/22/21 - 02/14/22    Authorization - Visit Number 6    Authorization - Number of Visits 10    PT Start Time [id number]    PT Stop Time 1357    PT Time Calculation (min) 42 min    Activity Tolerance Patient tolerated treatment well    Behavior During Therapy [facility] for tasks assessed/performed             Past Medical History:  Diagnosis Date   High cholesterol    Hypothyroidism    Neuropathy     Past Surgical History:  Procedure Laterality Date   APPENDECTOMY     17-18 yo   BACK SURGERY     Age 46   CESAREAN SECTION     ECTOPIC PREGNANCY SURGERY     LAPAROSCOPIC HYSTERECTOMY     LEFT HEART CATH AND CORONARY ANGIOGRAPHY N/A 01/09/2022   Procedure: LEFT HEART CATH AND CORONARY ANGIOGRAPHY;  Surgeon: Sherren Mocha, MD;  Location: [facility];  Service: Cardiovascular;  Laterality: N/A;    Vitals:   01/24/22 1318 01/24/22 1328  BP: 120/62 132/62  Pulse: 78 80  SpO2: 96% 97%     Subjective Assessment - 01/24/22 1319     Subjective Pt reports improvment with her sleep since the DN last visit - the foot no longer drawing up on her. She notes her R flank/buttock also has been bothering her more limiting her standing tolerance.    Patient Stated Goals "to walk with a more normal gait and feel steadier"                               [facility] Adult PT Treatment/Exercise - 01/24/22 1315       Lumbar Exercises:  Aerobic   Recumbent Bike L2 x 6 min      Lumbar Exercises: Standing   Functional Squats 10 reps;2 seconds    Functional Squats Limitations Glute touch to Airex pad on mat table + hip ABD isometric      Lumbar Exercises: Seated   Sit to Stand 10 reps   + red TB hip ABD isometric     Knee/Hip Exercises: Seated   Ball Squeeze 10 x 5" + TrA    Clamshell with TheraBand Red   TrA + alt single leg hip ABD/ER 10 x 3", 2 sets   Marching Both;2 sets;10 reps;Strengthening    Marching Limitations red TB march   cues for TrA, avoiding PPT                      PT Short Term Goals - 01/22/22 1355       PT SHORT TERM GOAL #1   Title Patient will be independent with initial HEP    Status Achieved   01/22/22   Target Date 01/03/22  PT SHORT TERM GOAL #2   Title Complete balance assessment an update LTGs as indicated    Status Achieved   11/27/21              PT Long Term Goals - 01/22/22 1355       PT LONG TERM GOAL #1   Title Patient will be independent with ongoing/advanced HEP for self-management at home    Status On-going    Target Date 02/14/22      PT LONG TERM GOAL #2   Title Patient will demonstrate improved B hip strength to >/= 4/5 and R ankle strength to >/= 3+/5 to 4-/5  for improved stability and ease of mobility    Status On-going    Target Date 02/14/22      PT LONG TERM GOAL #3   Title Patient will ambulate with improved gait pattern with decreased B foot drop/slap and no evidence of instability/LOB    Status On-going    Target Date 02/14/22      PT LONG TERM GOAL #4   Title Patient will report improved comfort with stair negotiation for safe community access    Status On-going    Target Date 02/14/22      PT LONG TERM GOAL #5   Title Patient will report improved feeling of stability with working in her garden    Status On-going    Target Date 02/14/22      PT LONG TERM GOAL #6   Title Patient will improve FGA score to >/= 21/30 to  improve gait stability and reduce risk for falls    Baseline 17/30 (11/27/21)    Status On-going    Target Date 02/14/22                   Plan - 01/24/22 1357     Clinical Impression Statement [name] Trujillo reports good relief from the DN and states she was able to sleep better with her R foot no longer drawing up on her. She notes decreased standing tolerance recently due to discomfort in her R flank/glutes but no TTP observed on palpation. TE focusing on core activation with glute and proximal LE strengthening, encouraging TrA activation with majority of exercises performed in sitting to minimize cardiac exertion. Exercises well tolerated and VSS throughout visit.    Comorbidities C5-7 ACDF 09/21/20; idiopathic progressive neuropathy; hypthyroidism; DDD; remote h/o back surgery at age 88; h/o scoliosis    Rehab Potential Good    PT Frequency 2x / week    PT Duration 12 weeks   8-12 weeks - due to scheduling availability, cert window will extend for 12 weeks   PT Treatment/Interventions ADLs/Self Care Home Management;Cryotherapy;Electrical Stimulation;Moist Heat;Ultrasound;DME Instruction;Gait training;Stair training;Functional mobility training;Therapeutic activities;Therapeutic exercise;Balance training;Neuromuscular re-education;Patient/family education;Manual techniques;Passive range of motion;Dry needling;Energy conservation;Taping;Vestibular;Spinal Manipulations;Joint Manipulations    PT Next Visit Plan further review/update of HEP including core/lumbopelvic strengthening & balance activities as tolerated; re-assess abnormal muscle tension in R LE and treat as indicated    PT Home Exercise Plan Access Code: [email or web address]    Consulted and Agree with Plan of Care Patient             Patient will benefit from skilled therapeutic intervention in order to improve the following deficits and impairments:  Abnormal gait, Decreased activity tolerance, Decreased balance, Decreased coordination,  Decreased endurance, Decreased knowledge of use of DME, Decreased mobility, Decreased range of motion, Decreased safety awareness, Decreased strength, Difficulty walking, Increased fascial restricitons, Increased muscle spasms,  Impaired perceived functional ability, Impaired flexibility, Impaired sensation, Dizziness, Impaired tone, Improper body mechanics, Postural dysfunction   Rationale for Evaluation and Treatment Rehabilitation   Visit Diagnosis: Radiculopathy, lumbar region  Muscle weakness (generalized)  Foot drop, left  Foot drop, right  Other abnormalities of gait and mobility  Unsteadiness on feet     Problem List Patient Active Problem List   Diagnosis Date Noted   Elevated troponin 01/10/2022   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  [place], [place], [address] Phone: [phone]   Fax:  [phone]  Name: Brisa Pratt MRN: [id number] Date of Birth: 08/13/1941

## 2022-01-29 ENCOUNTER — Encounter: Payer: Self-pay | Admitting: Physical Therapy

## 2022-01-29 ENCOUNTER — Ambulatory Visit: Payer: Medicare PPO | Admitting: Physical Therapy

## 2022-01-29 DIAGNOSIS — R2689 Other abnormalities of gait and mobility: Secondary | ICD-10-CM

## 2022-01-29 DIAGNOSIS — R2681 Unsteadiness on feet: Secondary | ICD-10-CM

## 2022-01-29 DIAGNOSIS — M5416 Radiculopathy, lumbar region: Secondary | ICD-10-CM | POA: Diagnosis not present

## 2022-01-29 DIAGNOSIS — M21372 Foot drop, left foot: Secondary | ICD-10-CM

## 2022-01-29 DIAGNOSIS — M6281 Muscle weakness (generalized): Secondary | ICD-10-CM

## 2022-01-29 DIAGNOSIS — M21371 Foot drop, right foot: Secondary | ICD-10-CM

## 2022-01-29 NOTE — Therapy (Signed)
[facility] [address]  [place], [place], [address] Phone: [phone]   Fax:  [phone]  Physical Therapy Treatment  Patient Details  Name: Crystalmarie Domm MRN: [id number] Date of Birth: [birth year] Referring Provider (PT): Dorena Dew, NP for [name], MD   Encounter Date: 01/29/2022   PT End of Session - 01/29/22 1314     Visit Number 8    Number of Visits 20    Date for PT Re-Evaluation 02/14/22    Authorization Type [employer]    Authorization Time Period 11/22/21 - 02/14/22    Authorization - Visit Number 7    Authorization - Number of Visits 10    PT Start Time 1314    PT Stop Time 1401    PT Time Calculation (min) 47 min    Activity Tolerance Patient tolerated treatment well    Behavior During Therapy [facility] for tasks assessed/performed             Past Medical History:  Diagnosis Date   High cholesterol    Hypothyroidism    Neuropathy     Past Surgical History:  Procedure Laterality Date   APPENDECTOMY     17-18 yo   BACK SURGERY     Age 40   CESAREAN SECTION     ECTOPIC PREGNANCY SURGERY     LAPAROSCOPIC HYSTERECTOMY     LEFT HEART CATH AND CORONARY ANGIOGRAPHY N/A 01/09/2022   Procedure: LEFT HEART CATH AND CORONARY ANGIOGRAPHY;  Surgeon: Sherren Mocha, MD;  Location: [facility];  Service: Cardiovascular;  Laterality: N/A;    There were no vitals filed for this visit.   Subjective Assessment - 01/29/22 1317     Subjective Pt reports she has been able to gradually increase her walking but still feels limited with her activity tolerance. Some increased muscle soreness noted in thighs after last session but otherwise no c/o. Still noting more limited standing tolerance due to pain in her lumbar spine.    Patient Stated Goals "to walk with a more normal gait and feel steadier"    Currently in Pain? No/denies                               [facility]  Adult PT Treatment/Exercise - 01/29/22 1314       Lumbar Exercises: Stretches   Other Lumbar Stretch Exercise seated 3-way prayer stretch/child's pose using hiking pole used for UE support 3 x 30 sec    Other Lumbar Stretch Exercise seated R/L QL stretch 3 x 30 sec      Lumbar Exercises: Aerobic   Recumbent Bike L2 x 6 min      Lumbar Exercises: Standing   Scapular Retraction Both;10 reps;Strengthening;Theraband    Theraband Level (Scapular Retraction) Level 2 (Red)    Scapular Retraction Limitations cues for TrA bracing with mini-shoulder extensions - attempted with feet in wide BOS and staggered stance to increase core activation    Row Both;10 reps;Strengthening;Theraband    Theraband Level (Row) Level 2 (Red)    Row Limitations cues for TrA bracing and scap retraction - attempted with feet in wide BOS and staggered stance to increase core activation      Lumbar Exercises: Seated   Other Seated Lumbar Exercises R/L pallof antirotation press x 15    Other Seated Lumbar Exercises TrA + red TB alt UE diagonals x 10  Manual Therapy   Manual Therapy Soft tissue mobilization;Myofascial release    Soft tissue mobilization STM/DTM L>R lumbar paraspinals and QL    Myofascial Release pin and stretch to L lumbar paraspinals and QL                       PT Short Term Goals - 01/22/22 1355       PT SHORT TERM GOAL #1   Title Patient will be independent with initial HEP    Status Achieved   01/22/22   Target Date 01/03/22      PT SHORT TERM GOAL #2   Title Complete balance assessment an update LTGs as indicated    Status Achieved   11/27/21              PT Long Term Goals - 01/22/22 1355       PT LONG TERM GOAL #1   Title Patient will be independent with ongoing/advanced HEP for self-management at home    Status On-going    Target Date 02/14/22      PT LONG TERM GOAL #2   Title Patient will demonstrate improved B hip strength to >/= 4/5 and R ankle  strength to >/= 3+/5 to 4-/5  for improved stability and ease of mobility    Status On-going    Target Date 02/14/22      PT LONG TERM GOAL #3   Title Patient will ambulate with improved gait pattern with decreased B foot drop/slap and no evidence of instability/LOB    Status On-going    Target Date 02/14/22      PT LONG TERM GOAL #4   Title Patient will report improved comfort with stair negotiation for safe community access    Status On-going    Target Date 02/14/22      PT LONG TERM GOAL #5   Title Patient will report improved feeling of stability with working in her garden    Status On-going    Target Date 02/14/22      PT LONG TERM GOAL #6   Title Patient will improve FGA score to >/= 21/30 to improve gait stability and reduce risk for falls    Baseline 17/30 (11/27/21)    Status On-going    Target Date 02/14/22                   Plan - 01/29/22 1401     Clinical Impression Statement Yassmin reports continued gradual improvement in her walking tolerance but continues to experience limited standing tolerance while working in her kitchen due to LBP. Today's session initially focusing on manual therapy to address increased muscle tension in lumbar paraspinals and QL (pt opting to defer DN to lumbar musculature) with good relief noted. Reinforced improved muscle flexibility and reduce muscle tension with seated lumbar and QL stretches followed by progression of lumbar/core strengthening targeting TrA, obliques and lumbar paraspinals. All stretches and exercises well tolerated with pt noting decrease in muscle tension and discomfort upon standing by end of session.    Comorbidities C5-7 ACDF 09/21/20; idiopathic progressive neuropathy; hypthyroidism; DDD; remote h/o back surgery at age 60; h/o scoliosis    Rehab Potential Good    PT Frequency 2x / week    PT Duration 12 weeks   8-12 weeks - due to scheduling availability, cert window will extend for 12 weeks   PT  Treatment/Interventions ADLs/Self Care Home Management;Cryotherapy;Electrical Stimulation;Moist Heat;Ultrasound;DME Instruction;Gait training;Stair training;Functional mobility training;Therapeutic activities;Therapeutic  exercise;Balance training;Neuromuscular re-education;Patient/family education;Manual techniques;Passive range of motion;Dry needling;Energy conservation;Taping;Vestibular;Spinal Manipulations;Joint Manipulations    PT Next Visit Plan further review/update of HEP including core/lumbopelvic strengthening & balance activities as tolerated; re-assess abnormal muscle tension in R LE +/- low back and treat as indicated    PT Home Exercise Plan Access Code: [email or web address]    Consulted and Agree with Plan of Care Patient             Patient will benefit from skilled therapeutic intervention in order to improve the following deficits and impairments:  Abnormal gait, Decreased activity tolerance, Decreased balance, Decreased coordination, Decreased endurance, Decreased knowledge of use of DME, Decreased mobility, Decreased range of motion, Decreased safety awareness, Decreased strength, Difficulty walking, Increased fascial restricitons, Increased muscle spasms, Impaired perceived functional ability, Impaired flexibility, Impaired sensation, Dizziness, Impaired tone, Improper body mechanics, Postural dysfunction  Visit Diagnosis: Radiculopathy, lumbar region  Muscle weakness (generalized)  Foot drop, left  Foot drop, right  Other abnormalities of gait and mobility  Unsteadiness on feet   Rationale for Evaluation and Treatment Rehabilitation    Problem List Patient Active Problem List   Diagnosis Date Noted   Elevated troponin 01/10/2022   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  [place], [place], [address] Phone: [phone]   Fax:  [phone]  Name: Charlyn Somerville MRN: [id number] Date of Birth: October 24, 1940

## 2022-01-30 LAB — BASIC METABOLIC PANEL
BUN/Creatinine Ratio: 23 (ref 12–28)
BUN: 19 mg/dL (ref 8–27)
CO2: 23 mmol/L (ref 20–29)
Calcium: 10.2 mg/dL (ref 8.7–10.3)
Chloride: 102 mmol/L (ref 96–106)
Creatinine, Ser: 0.84 mg/dL (ref 0.57–1.00)
Glucose: 100 mg/dL — ABNORMAL HIGH (ref 70–99)
Potassium: 5.1 mmol/L (ref 3.5–5.2)
Sodium: 139 mmol/L (ref 134–144)
eGFR: 71 mL/min/{1.73_m2} (ref >59)

## 2022-01-31 ENCOUNTER — Ambulatory Visit: Payer: Medicare PPO | Admitting: Physical Therapy

## 2022-02-06 ENCOUNTER — Telehealth: Payer: Self-pay | Admitting: Cardiology

## 2022-02-06 NOTE — Telephone Encounter (Signed)
Patient calling to say that she been seeing spots for the last 4 days. Unsure if it has anything to do with her medication. Please advise

## 2022-02-06 NOTE — Telephone Encounter (Signed)
-  Pt called stating for the past 2-3 days she's been seeing spots on and off. -She denies dizziness are feeling like she is going to pass out but report she is scared to walk of fear from falling.  -She state she's been trying to drink plenty fluid and rest and it seems to help with symptoms a little. - Pt report BP has been averaging around 114  Will forward to PA for recommendations

## 2022-02-07 NOTE — Telephone Encounter (Signed)
Please make sure the patient is not having weakness on one side of her body or slurred speech. I do not see any cardiac medications on her list that is associated with this type of visual disturbance.  Would defer to eye doctor for this.

## 2022-02-07 NOTE — Telephone Encounter (Signed)
Left message to call back  

## 2022-02-11 NOTE — Telephone Encounter (Signed)
Follow Up:     Patient is returning a call from Friday.

## 2022-02-11 NOTE — Telephone Encounter (Signed)
Patient stated the dark lines in her eyes only lasted on and off for 3 days. She drank water and laid down for an hour before the lines disappeared. She never had any dizziness, weakness, headache, or slurred speech. Speech clear on the phone. Gave her A. Duke, PA recommendation for her to see eye care practitioner. Patient voiced understanding.

## 2022-02-12 ENCOUNTER — Ambulatory Visit: Payer: Medicare PPO | Attending: Registered" | Admitting: Physical Therapy

## 2022-02-12 ENCOUNTER — Encounter: Payer: Self-pay | Admitting: Physical Therapy

## 2022-02-12 DIAGNOSIS — M21372 Foot drop, left foot: Secondary | ICD-10-CM | POA: Diagnosis present

## 2022-02-12 DIAGNOSIS — R2681 Unsteadiness on feet: Secondary | ICD-10-CM | POA: Diagnosis present

## 2022-02-12 DIAGNOSIS — M6281 Muscle weakness (generalized): Secondary | ICD-10-CM | POA: Diagnosis present

## 2022-02-12 DIAGNOSIS — M5416 Radiculopathy, lumbar region: Secondary | ICD-10-CM | POA: Diagnosis present

## 2022-02-12 DIAGNOSIS — R2689 Other abnormalities of gait and mobility: Secondary | ICD-10-CM

## 2022-02-12 DIAGNOSIS — M21371 Foot drop, right foot: Secondary | ICD-10-CM | POA: Diagnosis present

## 2022-02-12 NOTE — Therapy (Signed)
[facility] [address]  [place], [place], [address] Phone: [phone]   Fax:  [phone]  Physical Therapy Treatment  Patient Details  Name: [name] Trujillo MRN: [id number] Date of Birth: 1941-02-14 Referring Provider (PT): Dorena Dew, NP for [name], MD   Encounter Date: 02/12/2022   PT End of Session - 02/12/22 1315     Visit Number 9    Number of Visits 20    Date for PT Re-Evaluation 02/14/22    Authorization Type [employer]    Authorization Time Period 11/22/21 - 02/14/22    Authorization - Visit Number 8    Authorization - Number of Visits 10    PT Start Time 8676    PT Stop Time 1359    PT Time Calculation (min) 44 min    Activity Tolerance Patient tolerated treatment well    Behavior During Therapy [facility] for tasks assessed/performed             Past Medical History:  Diagnosis Date   High cholesterol    Hypothyroidism    Neuropathy     Past Surgical History:  Procedure Laterality Date   APPENDECTOMY     17-20 yo   BACK SURGERY     Age 51   CESAREAN SECTION     ECTOPIC PREGNANCY SURGERY     LAPAROSCOPIC HYSTERECTOMY     LEFT HEART CATH AND CORONARY ANGIOGRAPHY N/A 01/09/2022   Procedure: LEFT HEART CATH AND CORONARY ANGIOGRAPHY;  Surgeon: Sherren Mocha, MD;  Location: [facility];  Service: Cardiovascular;  Laterality: N/A;    There were no vitals filed for this visit.   Subjective Assessment - 02/12/22 1319     Subjective Pt denies any pain in her back but notes some pain in her legs at times - relieved with topical analgesic.    Pertinent History 09/21/20 - ACDF C5-6-7    Patient Stated Goals "to walk with a more normal gait and feel steadier"    Currently in Pain? No/denies                [facility] PT Assessment - 02/12/22 1315       Assessment   Medical Diagnosis Lumbar radiculopathy    Referring Provider (PT) Dorena Dew, NP for [name], MD     Next MD Visit 03/20/22      Strength   Right Hip Flexion 4/5    Right Hip Extension 5/5    Right Hip External Rotation  3+/5    Right Hip Internal Rotation 4-/5    Right Hip ABduction 4/5    Right Hip ADduction 4+/5    Left Hip Flexion 4/5    Left Hip Extension 5/5    Left Hip External Rotation 3+/5    Left Hip Internal Rotation 4-/5    Left Hip ABduction 4+/5    Left Hip ADduction 4/5    Right Knee Flexion 5/5    Right Knee Extension 5/5    Left Knee Flexion 4+/5    Left Knee Extension 5/5    Right Ankle Dorsiflexion 3+/5    Right Ankle Plantar Flexion 3/5   unable to initiate single leg heel raise; 4/5 with manual resistance   Right Ankle Inversion 3+/5    Right Ankle Eversion 4-/5    Left Ankle Dorsiflexion 3/5    Left Ankle Plantar Flexion 3/5   unable to initiate single leg heel raise; 4-/5 to 4/5 with  manual resistance   Left Ankle Inversion 2-/5    Left Ankle Eversion 3+/5                           [facility] Adult PT Treatment/Exercise - 02/12/22 1315       Ambulation/Gait   Ambulation Distance (Feet) 240 Feet    Assistive device Other (Comment)   single walking/hiking pole   Gait Pattern Step-through pattern;Ataxic;Lateral hip instability;Decreased dorsiflexion - right;Decreased dorsiflexion - left   L>R foot slap   Stairs Yes    Stairs Assistance 6: Modified independent (Device/Increase time)    Stair Management Technique One rail Right;Alternating pattern;Forwards    Number of Stairs 14    Height of Stairs 7      Lumbar Exercises: Aerobic   Recumbent Bike L3 x 6 min                       PT Short Term Goals - 01/22/22 1355       PT SHORT TERM GOAL #1   Title Patient will be independent with initial HEP    Status Achieved   01/22/22   Target Date 01/03/22      PT SHORT TERM GOAL #2   Title Complete balance assessment an update LTGs as indicated    Status Achieved   11/27/21              PT Long Term Goals - 02/12/22  1333       PT LONG TERM GOAL #1   Title Patient will be independent with ongoing/advanced HEP for self-management at home    Status On-going    Target Date 02/14/22      PT LONG TERM GOAL #2   Title Patient will demonstrate improved B hip strength to >/= 4/5 and R ankle strength to >/= 3+/5 to 4-/5  for improved stability and ease of mobility    Status Partially Met   02/12/22   Target Date 02/14/22      PT LONG TERM GOAL #3   Title Patient will ambulate with improved gait pattern with decreased B foot drop/slap and no evidence of instability/LOB    Status Partially Met   02/12/22 - pt continues to demonstrate B foot slap but feels less unsteady than previously   Target Date 02/14/22      PT LONG TERM GOAL #4   Title Patient will report improved comfort with stair negotiation for safe community access    Status Achieved   02/12/22 - Pt reports increased comfort from a stability standpoint but notes some increased fatigue related to SOB since her MI   Target Date 02/14/22      PT LONG TERM GOAL #5   Title Patient will report improved feeling of stability with working in her garden    Status Achieved   02/12/22   Target Date 02/14/22      PT LONG TERM GOAL #6   Title Patient will improve FGA score to >/= 21/30 to improve gait stability and reduce risk for falls    Baseline 17/30 (11/27/21)    Status On-going    Target Date 02/14/22                   Plan - 02/12/22 1359     Clinical Impression Statement [name] Trujillo reports she has been scheduled to start cardiac rehab on 04/02/22 at [place] at the [facility]  [place] and hence would like to plan to wrap up her PT episode this week rather than consider recert. We initiated her goal assessment with improvements noted in her gait stability and ability to navigate stairs although B foot slap still evident with gait. She has demonstrated continued gains in her LE strength although some weakness still evident in B  hips and ankles. Will plan to complete the balance assessment and remaining goal assessment along with address any questions regarding her ongoing HEP next visit, then anticipate discharge from PT.    Comorbidities C5-7 ACDF 09/21/20; idiopathic progressive neuropathy; hypthyroidism; DDD; remote h/o back surgery at age 77; h/o scoliosis    Rehab Potential Good    PT Frequency 2x / week    PT Duration 12 weeks   8-12 weeks - due to scheduling availability, cert window will extend for 12 weeks   PT Treatment/Interventions ADLs/Self Care Home Management;Cryotherapy;Electrical Stimulation;Moist Heat;Ultrasound;DME Instruction;Gait training;Stair training;Functional mobility training;Therapeutic activities;Therapeutic exercise;Balance training;Neuromuscular re-education;Patient/family education;Manual techniques;Passive range of motion;Dry needling;Energy conservation;Taping;Vestibular;Spinal Manipulations;Joint Manipulations    PT Next Visit Plan complete goal assessment; review HEP as needed; anticipate discharge from PT    PT Home Exercise Plan Access Code: [email or web address]    Consulted and Agree with Plan of Care Patient             Patient will benefit from skilled therapeutic intervention in order to improve the following deficits and impairments:  Abnormal gait, Decreased activity tolerance, Decreased balance, Decreased coordination, Decreased endurance, Decreased knowledge of use of DME, Decreased mobility, Decreased range of motion, Decreased safety awareness, Decreased strength, Difficulty walking, Increased fascial restricitons, Increased muscle spasms, Impaired perceived functional ability, Impaired flexibility, Impaired sensation, Dizziness, Impaired tone, Improper body mechanics, Postural dysfunction  Visit Diagnosis: Radiculopathy, lumbar region  Muscle weakness (generalized)  Foot drop, left  Foot drop, right  Other abnormalities of gait and mobility  Unsteadiness on  feet   Rationale for Evaluation and Treatment: Rehabilitation    Problem List Patient Active Problem List   Diagnosis Date Noted   Elevated troponin 01/10/2022   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  [place], [place], [address] Phone: [phone]   Fax:  [phone]  Name: [name] Trujillo MRN: [id number] Date of Birth: [birth year]

## 2022-02-14 ENCOUNTER — Encounter: Payer: Self-pay | Admitting: Physical Therapy

## 2022-02-14 ENCOUNTER — Ambulatory Visit: Payer: Medicare PPO | Admitting: Physical Therapy

## 2022-02-14 DIAGNOSIS — R2689 Other abnormalities of gait and mobility: Secondary | ICD-10-CM

## 2022-02-14 DIAGNOSIS — M5416 Radiculopathy, lumbar region: Secondary | ICD-10-CM | POA: Diagnosis not present

## 2022-02-14 DIAGNOSIS — M21372 Foot drop, left foot: Secondary | ICD-10-CM

## 2022-02-14 DIAGNOSIS — M6281 Muscle weakness (generalized): Secondary | ICD-10-CM

## 2022-02-14 DIAGNOSIS — M21371 Foot drop, right foot: Secondary | ICD-10-CM

## 2022-02-14 DIAGNOSIS — R2681 Unsteadiness on feet: Secondary | ICD-10-CM

## 2022-02-14 NOTE — Therapy (Signed)
[facility] [address]  [place], [place], [address] Phone: [phone]   Fax:  [phone]  Physical Therapy Treatment / Discharge Summary  Patient Details  Name: [name] Trujillo MRN: [id number] Date of Birth: 11-13-1940 Referring Provider (PT): Dorena Dew, NP for [name], MD  Progress Note  Reporting Period 11/22/2021 to 02/14/2022  See note below for Objective Data and Assessment of Progress/Goals.     Encounter Date: 02/14/2022   PT End of Session - 02/14/22 1315     Visit Number 10    Number of Visits 20    Date for PT Re-Evaluation 02/14/22    Authorization Type [employer]    Authorization Time Period 11/22/21 - 02/14/22    Authorization - Visit Number 9    Authorization - Number of Visits 10    PT Start Time 1315    PT Stop Time 1356    PT Time Calculation (min) 41 min    Activity Tolerance Patient tolerated treatment well    Behavior During Therapy WFL for tasks assessed/performed             Past Medical History:  Diagnosis Date   High cholesterol    Hypothyroidism    Neuropathy     Past Surgical History:  Procedure Laterality Date   APPENDECTOMY     17-20 yo   BACK SURGERY     Age 82   CESAREAN SECTION     ECTOPIC PREGNANCY SURGERY     LAPAROSCOPIC HYSTERECTOMY     LEFT HEART CATH AND CORONARY ANGIOGRAPHY N/A 01/09/2022   Procedure: LEFT HEART CATH AND CORONARY ANGIOGRAPHY;  Surgeon: Sherren Mocha, MD;  Location: [facility];  Service: Cardiovascular;  Laterality: N/A;    There were no vitals filed for this visit.   Subjective Assessment - 02/14/22 1316     Subjective Pt reports she has been tolerating working in her garden and resuming her walking for exercise since her heart attack. She feels that her balance has been better.    Pertinent History 09/21/20 - ACDF C5-6-7    Patient Stated Goals "to walk with a more normal gait and feel steadier"    Currently in  Pain? No/denies                [facility] PT Assessment - 02/14/22 1315       Assessment   Medical Diagnosis Lumbar radiculopathy    Referring Provider (PT) Dorena Dew, NP for [name], MD    Onset Date/Surgical Date --   chronic   Next MD Visit 03/20/22      Ambulation/Gait   Ambulation/Gait Assistance 6: Modified independent (Device/Increase time)    Assistive device --   single walking/hiking pole   Gait Pattern Step-through pattern;Ataxic;Lateral hip instability;Decreased dorsiflexion - right;Decreased dorsiflexion - left   L>R foot slap   Gait velocity 2.46 ft/sec w/o AD; 2.41 ft/sec with hiking pole      Standardized Balance Assessment   10 Meter Walk 13.32 sec w/o AD; 13.6 sec with hiking pole      Berg Balance Test   Sit to Stand Able to stand without using hands and stabilize independently    Standing Unsupported Able to stand safely 2 minutes    Sitting with Back Unsupported but Feet Supported on Floor or Stool Able to sit safely and securely 2 minutes    Stand to Sit Sits safely with minimal use of hands  Transfers Able to transfer safely, minor use of hands    Standing Unsupported with Eyes Closed Able to stand 10 seconds safely    Standing Unsupported with Feet Together Able to place feet together independently and stand 1 minute safely    From Standing, Reach Forward with Outstretched Arm Can reach confidently >25 cm (10")    From Standing Position, Pick up Object from Floor Able to pick up shoe safely and easily    From Standing Position, Turn to Look Behind Over each Shoulder Looks behind one side only/other side shows less weight shift    Turn 360 Degrees Able to turn 360 degrees safely but slowly    Standing Unsupported, Alternately Place Feet on Step/Stool Able to stand independently and complete 8 steps >20 seconds    Standing Unsupported, One Foot in Front Able to plae foot ahead of the other independently and hold 30 seconds    Standing on One Leg  Able to lift leg independently and hold equal to or more than 3 seconds    Total Score 49    Berg comment: 46-51 moderate risk for falls (>50%      Functional Gait  Assessment   Gait Level Surface Walks 20 ft in less than 7 sec but greater than 5.5 sec, uses assistive device, slower speed, mild gait deviations, or deviates 6-10 in outside of the 12 in walkway width.    Change in Gait Speed Able to smoothly change walking speed without loss of balance or gait deviation. Deviate no more than 6 in outside of the 12 in walkway width.    Gait with Horizontal Head Turns Performs head turns smoothly with slight change in gait velocity (eg, minor disruption to smooth gait path), deviates 6-10 in outside 12 in walkway width, or uses an assistive device.    Gait with Vertical Head Turns Performs task with slight change in gait velocity (eg, minor disruption to smooth gait path), deviates 6 - 10 in outside 12 in walkway width or uses assistive device    Gait and Pivot Turn Pivot turns safely within 3 sec and stops quickly with no loss of balance.    Step Over Obstacle Is able to step over 2 stacked shoe boxes taped together (9 in total height) without changing gait speed. No evidence of imbalance.    Gait with Narrow Base of Support Ambulates less than 4 steps heel to toe or cannot perform without assistance.    Gait with Eyes Closed Cannot walk 20 ft without assistance, severe gait deviations or imbalance, deviates greater than 15 in outside 12 in walkway width or will not attempt task.    Ambulating Backwards Walks 20 ft, uses assistive device, slower speed, mild gait deviations, deviates 6-10 in outside 12 in walkway width.    Steps Alternating feet, must use rail.    Total Score 19    FGA comment: 19-24 = medium risk fall                                    PT Education - 02/14/22 1350     Education Details Verbal review of most relevant ongoing HEP exercises and activities  for maintenance program    Person(s) Educated Patient    Methods Explanation    Comprehension Verbalized understanding              PT Short Term Goals - 01/22/22 1355  PT SHORT TERM GOAL #1   Title Patient will be independent with initial HEP    Status Achieved   01/22/22   Target Date 01/03/22      PT SHORT TERM GOAL #2   Title Complete balance assessment an update LTGs as indicated    Status Achieved   11/27/21              PT Long Term Goals - 02/14/22 1352       PT LONG TERM GOAL #1   Title Patient will be independent with ongoing/advanced HEP for self-management at home    Status Achieved   02/14/22   Target Date 02/14/22      PT LONG TERM GOAL #2   Title Patient will demonstrate improved B hip strength to >/= 4/5 and R ankle strength to >/= 3+/5 to 4-/5  for improved stability and ease of mobility    Status Partially Met   02/12/22   Target Date 02/14/22      PT LONG TERM GOAL #3   Title Patient will ambulate with improved gait pattern with decreased B foot drop/slap and no evidence of instability/LOB    Status Partially Met   02/12/22 - pt continues to demonstrate B foot slap but feels less unsteady than previously   Target Date 02/14/22      PT LONG TERM GOAL #4   Title Patient will report improved comfort with stair negotiation for safe community access    Status Achieved   02/12/22 - Pt reports increased comfort from a stability standpoint but notes some increased fatigue related to SOB since her MI   Target Date 02/14/22      PT LONG TERM GOAL #5   Title Patient will report improved feeling of stability with working in her garden    Status Achieved   02/12/22   Target Date 02/14/22      PT LONG TERM GOAL #6   Title Patient will improve FGA score to >/= 21/30 to improve gait stability and reduce risk for falls    Baseline 17/30 (11/27/21)    Status Partially Met   02/14/22 - FGA improved to 19/30   Target Date 02/14/22                   Plan  - 02/14/22 1356     Clinical Impression Statement [name] Trujillo reports improved sense of core stability and balance allowing her to work in her garden and continue/increase her walking for exercise w/o LOB. Standardized balance testing revealing gains from 40/56 to 49/56 on Berg and 17/30 to 19/30 on FGA, increasing a reduction in her fall risk from high to moderate/medium. Her gait stability is improved both with and w/o her hiking pole. Her gait speed is essentially unchanged although she has intentionally been avoiding pushing herself too hard as instructed by her cardiologist following her recent MI/Takotsubo cardiomyopathy event. She will be starting cardiac rehab on 04/02/22 following her cardiac event and hence would like to transition to her PT HEP at this time - we reviewed her current exercises and identified the most relevant exercises to continue as part of her maintenance level HEP to prevent a future decline in function. Her PT goals are all met or partially met and she reports good comfort with recommendations for ongoing exercises and activity, therefore will proceed with D/C from PT.    Comorbidities C5-7 ACDF 09/21/20; idiopathic progressive neuropathy; hypthyroidism; DDD; remote h/o back surgery at age 49; h/o  scoliosis    PT Treatment/Interventions ADLs/Self Care Home Management;Cryotherapy;Electrical Stimulation;Moist Heat;Ultrasound;DME Instruction;Gait training;Stair training;Functional mobility training;Therapeutic activities;Therapeutic exercise;Balance training;Neuromuscular re-education;Patient/family education;Manual techniques;Passive range of motion;Dry needling;Energy conservation;Taping;Vestibular;Spinal Manipulations;Joint Manipulations    PT Next Visit Plan Discharge from PT    PT Home Exercise Plan Access Code: [email or web address]    Consulted and Agree with Plan of Care Patient             Patient will benefit from skilled therapeutic intervention in order to improve the following  deficits and impairments:  Abnormal gait, Decreased activity tolerance, Decreased balance, Decreased coordination, Decreased endurance, Decreased knowledge of use of DME, Decreased mobility, Decreased range of motion, Decreased safety awareness, Decreased strength, Difficulty walking, Increased fascial restricitons, Increased muscle spasms, Impaired perceived functional ability, Impaired flexibility, Impaired sensation, Dizziness, Impaired tone, Improper body mechanics, Postural dysfunction  Visit Diagnosis: Radiculopathy, lumbar region  Muscle weakness (generalized)  Foot drop, left  Foot drop, right  Other abnormalities of gait and mobility  Unsteadiness on feet   Rationale for Evaluation and Treatment:  Rehabilitation   Problem List Patient Active Problem List   Diagnosis Date Noted   Elevated troponin 01/10/2022   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   Remaining deficits:    As above.   Education / Equipment:    HEP   Patient agrees to discharge. Patient goals were partially met. Patient is being discharged due to being pleased with the current functional level.   Percival Spanish, PT 02/14/2022, 2:24 PM  [facility] [address]  [place], [place], [address] Phone: [phone]   Fax:  [phone]  Name: [name] Trujillo MRN: [id number] Date of Birth: [birth year]

## 2022-03-14 ENCOUNTER — Other Ambulatory Visit: Payer: Self-pay | Admitting: Family Medicine

## 2022-03-14 ENCOUNTER — Ambulatory Visit
Admission: RE | Admit: 2022-03-14 | Discharge: 2022-03-14 | Disposition: A | Discharge status: Home / Self Care | Payer: Medicare PPO | Source: Ambulatory Visit | Attending: Family Medicine | Admitting: Family Medicine

## 2022-03-14 DIAGNOSIS — Z981 Arthrodesis status: Secondary | ICD-10-CM

## 2022-03-18 ENCOUNTER — Ambulatory Visit (HOSPITAL_BASED_OUTPATIENT_CLINIC_OR_DEPARTMENT_OTHER)
Admission: RE | Admit: 2022-03-18 | Discharge: 2022-03-18 | Disposition: A | Discharge status: Home / Self Care | Payer: Medicare PPO | Source: Ambulatory Visit | Attending: Physician Assistant | Admitting: Physician Assistant

## 2022-03-18 DIAGNOSIS — I341 Nonrheumatic mitral (valve) prolapse: Secondary | ICD-10-CM

## 2022-03-18 DIAGNOSIS — I5181 Takotsubo syndrome: Secondary | ICD-10-CM | POA: Insufficient documentation

## 2022-03-18 HISTORY — PX: TRANSTHORACIC ECHOCARDIOGRAM: SHX275

## 2022-03-18 HISTORY — DX: Nonrheumatic mitral (valve) prolapse: I34.1

## 2022-03-18 LAB — ECHOCARDIOGRAM COMPLETE
AR max vel: 2.02 cm2
AV Area VTI: 2.09 cm2
AV Area mean vel: 2.08 cm2
AV Mean grad: 3 mmHg
AV Peak grad: 7.3 mmHg
Ao pk vel: 1.35 m/s
Area-P 1/2: 6.48 cm2
S' Lateral: 2.6 cm

## 2022-03-18 NOTE — Progress Notes (Signed)
  Echocardiogram 2D Echocardiogram has been performed.  [name] Trujillo F 03/18/2022, 1:44 PM

## 2022-03-19 ENCOUNTER — Telehealth (INDEPENDENT_AMBULATORY_CARE_PROVIDER_SITE_OTHER): Payer: Self-pay

## 2022-03-19 NOTE — Telephone Encounter (Signed)
Attempted to call patient to confirm appt with XR.      Patient stated that disk/report will be brought to appt. Obtained from [place].      3:23PM ET  March 19, 2022   Stephanie Castaneda, [place]

## 2022-03-20 ENCOUNTER — Ambulatory Visit: Payer: Medicare PPO | Attending: Neurological Surgery | Admitting: Neurological Surgery

## 2022-03-20 ENCOUNTER — Encounter: Payer: Self-pay | Admitting: Neurological Surgery

## 2022-03-20 VITALS — BP 150/68 | HR 111 | Resp 18 | Ht 62.0 in | Wt 123.6 lb

## 2022-03-20 DIAGNOSIS — M5136 Other intervertebral disc degeneration, lumbar region: Secondary | ICD-10-CM | POA: Insufficient documentation

## 2022-03-20 DIAGNOSIS — R200 Anesthesia of skin: Secondary | ICD-10-CM | POA: Insufficient documentation

## 2022-03-20 DIAGNOSIS — M48062 Spinal stenosis, lumbar region with neurogenic claudication: Secondary | ICD-10-CM | POA: Insufficient documentation

## 2022-03-20 DIAGNOSIS — M47812 Spondylosis without myelopathy or radiculopathy, cervical region: Secondary | ICD-10-CM | POA: Insufficient documentation

## 2022-03-20 DIAGNOSIS — M48061 Spinal stenosis, lumbar region without neurogenic claudication: Secondary | ICD-10-CM

## 2022-03-20 DIAGNOSIS — R531 Weakness: Secondary | ICD-10-CM | POA: Insufficient documentation

## 2022-03-21 NOTE — Progress Notes (Signed)
[facility] Neurosurgery  Follow up Note    Previous Impression/Plan   Stephanie Castaneda is a 81 year-old female with multilevel spondylosis, bilateral lower extremity numbness. Again, we reviewed the EMG/NCV demonstrating a chronic length dependent neuropathy in the lower extremities. Would recommend continuation of conservative measures as she has noted some improvement. Would not recommend surgical intervention in absence of pain, acute neurological deficit. Advised patient to use fish oil, vitamin B12. Advised patient she may try another ESI. May consider surgical intervention in presence of pain.      With respect to cervical spine, xray studies appear stable. She will plan to follow-up in six months with repeat x-ray studies of the cervical spine.    HPI     Chief Complaint   Patient presents with    Follow-up     DOS 09/21/20  s/p C5-7 ACDF  XR C-SPINE  Stephanie Castaneda([place])  PT   Stephanie Castaneda is here for 6 month post-op follow-up. She reports that her hansa and neck are "perfect". However, she notes continued numbness in the RLE(knee to foot) and bilateral leg weakness that she feels is worsening. She reports that she has had several instances where she was close to falling due to the leg weakness. She received an L4/5 TFESI by Dr. Chandra BatchFischer 11/2021. She would like to pursue surgical options if possible to help with her legs.    Physical Examination   VITAL SIGNS:   height is 1.575 m (5\' 2" ) and weight is 56.1 kg (123 lb 9.6 oz). Her blood pressure is 150/68 and her pulse is 111 (abnormal). Her respiration is 18.        Neurologic Exam  General: Well developed, well nourished, no acute distress  HEENT: Normocephalic, atraumatic, extra-ocular movements intact, pupils equally round and reactive, sclera are anicteric, conjunctiva normal, nares patent,   Respiratory: Equal Chest Rise; No labored breathing  Cardiovascular: No marked edema  Skin: warm, dry and well perfused.      Neuro exam:    Awake and alert. Oriented  x 3   Fund of knowledge appropriate  Normal attention span, concentration, and language  Speech fluent    Cranial nerves:   CN II-XII grossly intact    Normal muscle bulk and tone  Motor:  RUE G 5/5  B 5/5 T 5/5 D 5/5  LUE  G 5/5  B 5/5 T 5/5 D 5/5  RLE IP 4/5 Q 5/5 H 5/5 DF 4-/5 PF 4+/5   LLE   IP 4/5 Q 5/5 H 5/5 DF 3/5 PF 4+/5     Sensory: Sensation intact to light touch decreased RLE- below knee   Gait: appears slightly unsteady  Assistive device: cane  No spontaneous abnormal movement, tremor, dysmetria, or ataxia  Reflexes:   Patellar 1+ b/l  Achilles 1+ b/l    Review of Systems   Review of Systems  As per HPI  Radiology Interpretation   No results found.    Impression   Stephanie Castaneda is a 81 year-old female with multilevel spondylosis, right lower extremity numbness, IP weakness and worsening R foot weakness. After discussion with patient, it was decided that she would like to proceed with surgery. We will proceed with L2/3 XLIF and L2/3 posterior fusion. [facility] Procedure was discussed in detail regarding risks/benefits/complications. All questions were answered. Informed consent was signed in IMED.  Secondary to the severe foraminal stenosis and facet arthropathy greater  than 50  pct of the facet joint will be necessary to removed in order to achieve decompression and therefore a fusion is necessary.  Plan   OR for  L2/3 XLIF and L2/3 decompression and posterior fusion  No orders of the defined types were placed in this encounter.    Follow-up   OR    All relevant and clinical information was transcribed by me, Iva Lento, PA-C, acting as a scribe for Dr. [name].      Marlaine Hind, MD  Joy A. Rostron, PA-C

## 2022-03-22 ENCOUNTER — Other Ambulatory Visit: Payer: Self-pay

## 2022-03-22 DIAGNOSIS — M5416 Radiculopathy, lumbar region: Secondary | ICD-10-CM

## 2022-03-25 ENCOUNTER — Encounter: Payer: Self-pay | Admitting: *Deleted

## 2022-04-05 NOTE — Progress Notes (Unsigned)
Cardiology Office Note:    Date:  04/09/2022   ID:  [name] Trujillo, DOB 07/15/1941, MRN [id number]  PCP:  Derinda Late, [facility] Cardiologist:  Glenetta Hew, MD Cardiology APP:  Ledora Bottcher, PA     Referring MD: Derinda Late, MD   Chief Complaint  Patient presents with   Follow-up    2 months.   Pre-op Exam    Stress CM    History of Present Illness:    [name] Trujillo is a 81 y.o. 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  Prior to admission, she remained active walking at least a half a mile per day.  She developed sudden chest pain after witnessing her husband fall on the driveway.  Troponin elevated from [phone].  Heart catheterization revealed no obstructive CAD, recommendations for medical management.  Echocardiogram revealed an LVEF 25 to 30% in a pattern suggestive of Takotsubo cardiomyopathy.  GDMT was titrated to include 25 mg Toprol, 12.5 mg spironolactone, and 12.5 mg losartan at discharge.  LVEDP normal on cath.  Her home statin was continued: Crestor 10 mg.  LDL was 81.  She was discharged on 01/10/22. I saw her for TOC follow up 01/18/22. Her daughter is an internal medicine physician in [place]. BP log with well-controlled BP.   She presents for follow up of her repeat echo.  She is doing remarkably well and is taking daily walks.  She denies chest pain, shortness of breath, dyspnea on exertion, orthopnea, and lower extremity swelling.  She has avoided salt intake.  We reviewed that her EF has normalized.  She thinks that she may be having some cognitive slowing with the metoprolol.  I think it is safe to reduce this to 12.5 mg Toprol at this time.  She asks about spironolactone and losartan.  I would like to leave her on these medications for at least 6 months following her event.  We will likely be able to titrate back this winter.  We also review her mitral valve  noted on echocardiogram.  She is asymptomatic.  Plan to repeat an echo in 1 year.  She also reports that she is expecting to have lumbar fusion surgery scheduled for 05/24/2022 and requests preoperative evaluation for her surgeon.  We have not yet received this preop request.  Her daughter is an Administrator, Civil Service in [place].  I let her know that I am happy to call her if she wishes.   Past Medical History:  Diagnosis Date   High cholesterol    Hypothyroidism    Neuropathy     Past Surgical History:  Procedure Laterality Date   APPENDECTOMY     17-18 yo   BACK SURGERY     Age 7   CESAREAN SECTION     ECTOPIC PREGNANCY SURGERY     LAPAROSCOPIC HYSTERECTOMY     LEFT HEART CATH AND CORONARY ANGIOGRAPHY N/A 01/09/2022   Procedure: LEFT HEART CATH AND CORONARY ANGIOGRAPHY;  Surgeon: Sherren Mocha, MD;  Location: [facility];  Service: Cardiovascular;  Laterality: N/A;    Current Medications: Current Meds  Medication Sig   acetaminophen (TYLENOL) 325 MG tablet Take 975 mg by mouth daily as needed for moderate pain.   Carboxymethylcellulose Sodium (REFRESH TEARS OP) Place 2 drops into both eyes 3 (three) times daily.   levothyroxine (SYNTHROID) 88 MCG tablet Take 88 mcg by mouth daily before breakfast.   metoprolol  succinate (TOPROL XL) 25 MG 24 hr tablet Take 0.5 tablets (12.5 mg total) by mouth daily.   solifenacin (VESICARE) 10 MG tablet Take 10 mg by mouth daily.   vitamin B-12 (CYANOCOBALAMIN) 1000 MCG tablet Take 1,000 mcg by mouth daily.   [DISCONTINUED] losartan (COZAAR) 25 MG tablet Take 1/2 tablet (12.5 mg total) by mouth daily.   [DISCONTINUED] metoprolol succinate (TOPROL-XL) 25 MG 24 hr tablet Take 1 tablet (25 mg total) by mouth daily.   [DISCONTINUED] rosuvastatin (CRESTOR) 10 MG tablet Take 10 mg by mouth at bedtime.   [DISCONTINUED] spironolactone (ALDACTONE) 25 MG tablet Take 1/2 tablet (12.5 mg total) by mouth daily.     Allergies:   Oxycodone hcl, Primidone,  Propranolol, Tolterodine, and Hydrocodone   Social History   Socioeconomic History   Marital status: Married    Spouse name: Not on file   Number of children: 7   Years of education: 16   Highest education level: Bachelor's degree (e.g., BA, AB, BS)  Occupational History   Occupation: Retired  Tobacco Use   Smoking status: Never   Smokeless tobacco: Never  Substance and Sexual Activity   Alcohol use: Yes    Comment: once or twice a month   Drug use: No   Sexual activity: Not on file  Other Topics Concern   Not on file  Social History Narrative   Lives at home w/ her husband    Right-handed   Caffeine: 2 cups of coffee in the morning   Social Determinants of Health   Financial Resource Strain: Not on file  Food Insecurity: Not on file  Transportation Needs: Not on file  Physical Activity: Not on file  Stress: Not on file  Social Connections: Not on file     Family History: The patient's family history includes Alcohol abuse in her father; Heart attack in her father; Stroke in her mother. There is no history of Neuropathy.  ROS:   Please see the history of present illness.     All other systems reviewed and are negative.  EKGs/Labs/Other Studies Reviewed:    The following studies were reviewed today:  Echo 03/2022: 1. Significant improvement of LVEF noted. Left ventricular ejection  fraction, by estimation, is 60 to 65%. The left ventricle has normal  function. The left ventricle has no regional wall motion abnormalities.  Left ventricular diastolic parameters were  normal.   2. Right ventricular systolic function is normal. The right ventricular  size is normal. There is mildly elevated pulmonary artery systolic  pressure.   3. Left atrial size was mildly dilated.   4. The mitral valve is myxomatous. Mild mitral valve regurgitation. No  evidence of mitral stenosis. There is mild late systolic prolapse of the  middle segment of the anterior leaflet of the  mitral valve.   5. The aortic valve is normal in structure. Aortic valve regurgitation is  not visualized. No aortic stenosis is present.   6. The inferior vena cava is normal in size with greater than 50%  respiratory variability, suggesting right atrial pressure of 3 mmHg.   EKG:  EKG is not ordered today.    Recent Labs: 01/07/2022: ALT 13 01/08/2022: B Natriuretic Peptide 162.1 01/09/2022: Hemoglobin 13.2; Platelets 212 01/29/2022: BUN 19; Creatinine, Ser 0.84; Potassium 5.1; Sodium 139  Recent Lipid Panel    Component Value Date/Time   CHOL 144 01/08/2022 0251   TRIG 25 01/08/2022 0251   HDL 58 01/08/2022 0251   CHOLHDL 2.5 01/08/2022  0251   VLDL 5 01/08/2022 0251   LDLCALC 81 01/08/2022 0251     Risk Assessment/Calculations:           Physical Exam:    VS:  BP (!) 118/52 (BP Location: Left Arm, Patient Position: Sitting, Cuff Size: Normal)   Pulse 74   Ht 5\' 2"  (1.575 m)   Wt 125 lb (56.7 kg)   BMI 22.86 kg/m     Wt Readings from Last 3 Encounters:  04/09/22 125 lb (56.7 kg)  01/18/22 122 lb 6.4 oz (55.5 kg)  01/08/22 118 lb 2.7 oz (53.6 kg)     GEN:  Well nourished, well developed in no acute distress HEENT: Normal NECK: No JVD; No carotid bruits LYMPHATICS: No lymphadenopathy CARDIAC: RRR, mild murmur on left lateal chest RESPIRATORY:  Clear to auscultation without rales, wheezing or rhonchi  ABDOMEN: Soft, non-tender, non-distended MUSCULOSKELETAL:  No edema; No deformity  SKIN: Warm and dry NEUROLOGIC:  Alert and oriented x 3 PSYCHIATRIC:  Normal affect   ASSESSMENT:    1. NSTEMI (non-ST elevated myocardial infarction) (HCC)   2. Coronary artery disease involving native heart without angina pectoris, unspecified vessel or lesion type   3. Takotsubo cardiomyopathy   4. Mitral valve insufficiency, unspecified etiology   5. Hypothyroidism, unspecified type   6. Preoperative cardiovascular examination    PLAN:    In order of problems listed  above:  Nonobstructive CAD NSTEMI 01/08/2022 Risk factor modification   Hyperlipidemia with LDL goal less than 70 01/08/2022: Cholesterol 144; HDL 58; LDL Cholesterol 81; Triglycerides 25; VLDL 5 Maintained on 10 mg Crestor   Takotsubo cardiomyopathy LVEF was 25 to 30% GDMT: 25 mg Toprol, 12.5 mg spironolactone, 12.5 mg losartan - no room to titrate GDMT Repeat an echocardiogram shows complete recovery of function: LVEF 60-65%, mild MR.  - Reduce Toprol to 12.5 mg - We will continue low-dose renal lactone and losartan for now, will likely be able to titrate off of these medications this winter - I advised to continue sodium restriction to 2000 mg daily, she would like to have her chips and dip back   Mild MR Follow clinically Mild murmur on exam No shortness of breath or lower extremity swelling Consider repeating an echocardiogram in 1 year   Hypothyroidism Continue Synthroid   Preoperative risk evaluation for Mace for upcoming surgery She has had a recent heart catheterization and recovery from a stress cardiomyopathy.  She has a 0.9% risk of Mace during the perioperative.  We discussed her risk and I believe she is optimized to proceed with surgery.  I expect to receive this request in the next 30 days.  She is able to complete greater than 4.0 METS and would be at acceptable risk to proceed.   Follow-up with Dr. 11-25-1985 in 3 months.   Medication Adjustments/Labs and Tests Ordered: Current medicines are reviewed at length with the patient today.  Concerns regarding medicines are outlined above.  No orders of the defined types were placed in this encounter.  Meds ordered this encounter  Medications   losartan (COZAAR) 25 MG tablet    Sig: Take 1/2 tablet (12.5 mg total) by mouth daily.    Dispense:  45 tablet    Refill:  3   rosuvastatin (CRESTOR) 10 MG tablet    Sig: Take 1 tablet (10 mg total) by mouth at bedtime.    Dispense:  90 tablet    Refill:  3    spironolactone (ALDACTONE) 25 MG  tablet    Sig: Take 1/2 tablet (12.5 mg total) by mouth daily.    Dispense:  45 tablet    Refill:  3   metoprolol succinate (TOPROL XL) 25 MG 24 hr tablet    Sig: Take 0.5 tablets (12.5 mg total) by mouth daily.    Dispense:  90 tablet    Refill:  3    Patient Instructions  Medication Instructions:  Decrease Metoprolol Succinate to 12.5 mg ( Take half of a 25 mg Tablet daily). *If you need a refill on your cardiac medications before your next appointment, please call your pharmacy*   Lab Work: No Labs If you have labs (blood work) drawn today and your tests are completely normal, you will receive your results only by: [place] (if you have MyChart) OR A paper copy in the mail If you have any lab test that is abnormal or we need to change your treatment, we will call you to review the results.   Testing/Procedures: No Testing   Follow-Up: At [facility], you and your health needs are our priority.  As part of our continuing mission to provide you with exceptional heart care, we have created designated Provider Care Teams.  These Care Teams include your primary Cardiologist (physician) and Advanced Practice Providers (APPs -  Physician Assistants and Nurse Practitioners) who all work together to provide you with the care you need, when you need it.  We recommend signing up for the patient portal called "MyChart".  Sign up information is provided on this After Visit Summary.  MyChart is used to connect with patients for Virtual Visits (Telemedicine).  Patients are able to view lab/test results, encounter notes, upcoming appointments, etc.  Non-urgent messages can be sent to your provider as well.   To learn more about what you can do with MyChart, go to [email or web address].    Your next appointment:   3 month(s)  The format for your next appointment:   In Person  Provider:   Glenetta Hew, MD         Signed, [place], [place]  04/09/2022 3:51 PM    [place]

## 2022-04-09 ENCOUNTER — Ambulatory Visit: Payer: Medicare PPO | Admitting: Physician Assistant

## 2022-04-09 ENCOUNTER — Encounter: Payer: Self-pay | Admitting: Physician Assistant

## 2022-04-09 VITALS — BP 118/52 | HR 74 | Ht 62.0 in | Wt 125.0 lb

## 2022-04-09 DIAGNOSIS — E039 Hypothyroidism, unspecified: Secondary | ICD-10-CM

## 2022-04-09 DIAGNOSIS — I2510 Atherosclerotic heart disease of native coronary artery without angina pectoris: Secondary | ICD-10-CM

## 2022-04-09 DIAGNOSIS — I214 Non-ST elevation (NSTEMI) myocardial infarction: Secondary | ICD-10-CM | POA: Diagnosis not present

## 2022-04-09 DIAGNOSIS — Z01810 Encounter for preprocedural cardiovascular examination: Secondary | ICD-10-CM

## 2022-04-09 DIAGNOSIS — I340 Nonrheumatic mitral (valve) insufficiency: Secondary | ICD-10-CM | POA: Diagnosis not present

## 2022-04-09 DIAGNOSIS — I5181 Takotsubo syndrome: Secondary | ICD-10-CM | POA: Diagnosis not present

## 2022-04-09 MED ORDER — SPIRONOLACTONE 25 MG PO TABS: 12.5000 mg | ORAL_TABLET | Freq: Every day | ORAL | 3 refills | Status: DC

## 2022-04-09 MED ORDER — ROSUVASTATIN CALCIUM 10 MG PO TABS: 10.0000 mg | ORAL_TABLET | Freq: Every day | ORAL | 3 refills | Status: DC

## 2022-04-09 MED ORDER — METOPROLOL SUCCINATE ER 25 MG PO TB24: 12.5000 mg | ORAL_TABLET | Freq: Every day | ORAL | 3 refills | Status: DC

## 2022-04-09 MED ORDER — LOSARTAN POTASSIUM 25 MG PO TABS: 12.5000 mg | ORAL_TABLET | Freq: Every day | ORAL | 3 refills | Status: DC

## 2022-04-09 NOTE — Patient Instructions (Signed)
Medication Instructions:  Decrease Metoprolol Succinate to 12.5 mg ( Take half of a 25 mg Tablet daily). *If you need a refill on your cardiac medications before your next appointment, please call your pharmacy*   Lab Work: No Labs If you have labs (blood work) drawn today and your tests are completely normal, you will receive your results only by: MyChart Message (if you have MyChart) OR A paper copy in the mail If you have any lab test that is abnormal or we need to change your treatment, we will call you to review the results.   Testing/Procedures: No Testing   Follow-Up: At [facility], you and your health needs are our priority.  As part of our continuing mission to provide you with exceptional heart care, we have created designated Provider Care Teams.  These Care Teams include your primary Cardiologist (physician) and Advanced Practice Providers (APPs -  Physician Assistants and Nurse Practitioners) who all work together to provide you with the care you need, when you need it.  We recommend signing up for the patient portal called "MyChart".  Sign up information is provided on this After Visit Summary.  MyChart is used to connect with patients for Virtual Visits (Telemedicine).  Patients are able to view lab/test results, encounter notes, upcoming appointments, etc.  Non-urgent messages can be sent to your provider as well.   To learn more about what you can do with MyChart, go to [email or web address].    Your next appointment:   3 month(s)  The format for your next appointment:   In Person  Provider:   Bryan Lemma, MD

## 2022-05-06 ENCOUNTER — Ambulatory Visit (INDEPENDENT_AMBULATORY_CARE_PROVIDER_SITE_OTHER): Payer: Medicare PPO | Admitting: Registered Nurse

## 2022-05-06 ENCOUNTER — Encounter (INDEPENDENT_AMBULATORY_CARE_PROVIDER_SITE_OTHER): Payer: Self-pay | Admitting: Registered Nurse

## 2022-05-06 VITALS — BP 132/61 | HR 76 | Ht 62.0 in | Wt 123.0 lb

## 2022-05-06 DIAGNOSIS — I340 Nonrheumatic mitral (valve) insufficiency: Secondary | ICD-10-CM | POA: Insufficient documentation

## 2022-05-06 DIAGNOSIS — I071 Rheumatic tricuspid insufficiency: Secondary | ICD-10-CM | POA: Insufficient documentation

## 2022-05-06 DIAGNOSIS — Z87898 Personal history of other specified conditions: Secondary | ICD-10-CM | POA: Insufficient documentation

## 2022-05-06 DIAGNOSIS — I10 Essential (primary) hypertension: Secondary | ICD-10-CM | POA: Insufficient documentation

## 2022-05-06 DIAGNOSIS — R112 Nausea with vomiting, unspecified: Secondary | ICD-10-CM

## 2022-05-06 DIAGNOSIS — Z01818 Encounter for other preprocedural examination: Secondary | ICD-10-CM

## 2022-05-06 DIAGNOSIS — T40605A Adverse effect of unspecified narcotics, initial encounter: Secondary | ICD-10-CM | POA: Insufficient documentation

## 2022-05-06 DIAGNOSIS — Z9889 Other specified postprocedural states: Secondary | ICD-10-CM

## 2022-05-06 DIAGNOSIS — E039 Hypothyroidism, unspecified: Secondary | ICD-10-CM | POA: Insufficient documentation

## 2022-05-06 DIAGNOSIS — I5181 Takotsubo syndrome: Secondary | ICD-10-CM | POA: Insufficient documentation

## 2022-05-06 DIAGNOSIS — I341 Nonrheumatic mitral (valve) prolapse: Secondary | ICD-10-CM | POA: Insufficient documentation

## 2022-05-06 DIAGNOSIS — I517 Cardiomegaly: Secondary | ICD-10-CM | POA: Insufficient documentation

## 2022-05-06 DIAGNOSIS — I2510 Atherosclerotic heart disease of native coronary artery without angina pectoris: Secondary | ICD-10-CM

## 2022-05-06 LAB — URINALYSIS REFLEX TO MICROSCOPIC EXAM - REFLEX TO CULTURE
Bilirubin, UA: NEGATIVE
Glucose, UA: NEGATIVE
Ketones UA: NEGATIVE
Nitrite, UA: POSITIVE — AB
Specific Gravity UA: 1.021 (ref 1.001–1.035)
Urine pH: 6 (ref 5.0–8.0)
Urobilinogen, UA: NORMAL mg/dL

## 2022-05-06 LAB — CBC AND DIFFERENTIAL
Absolute NRBC: 0 10*3/uL (ref 0.00–0.00)
Basophils Absolute Automated: 0.06 10*3/uL (ref 0.00–0.08)
Basophils Automated: 1 %
Eosinophils Absolute Automated: 0.07 10*3/uL (ref 0.00–0.44)
Eosinophils Automated: 1.2 %
Hematocrit: 40.3 % (ref 34.7–43.7)
Hgb: 13.6 g/dL (ref 11.4–14.8)
Immature Granulocytes Absolute: 0.01 10*3/uL (ref 0.00–0.07)
Immature Granulocytes: 0.2 %
Instrument Absolute Neutrophil Count: 3.98 10*3/uL (ref 1.10–6.33)
Lymphocytes Absolute Automated: 1.36 10*3/uL (ref 0.42–3.22)
Lymphocytes Automated: 22.7 %
MCH: 31.9 pg (ref 25.1–33.5)
MCHC: 33.7 g/dL (ref 31.5–35.8)
MCV: 94.6 fL (ref 78.0–96.0)
MPV: 10.2 fL (ref 8.9–12.5)
Monocytes Absolute Automated: 0.5 10*3/uL (ref 0.21–0.85)
Monocytes: 8.4 %
Neutrophils Absolute: 3.98 10*3/uL (ref 1.10–6.33)
Neutrophils: 66.5 %
Nucleated RBC: 0 /100 WBC (ref 0.0–0.0)
Platelets: 254 10*3/uL (ref 142–346)
RBC: 4.26 10*6/uL (ref 3.90–5.10)
RDW: 12 % (ref 11–15)
WBC: 5.98 10*3/uL (ref 3.10–9.50)

## 2022-05-06 LAB — PT AND APTT
PT INR: 0.9 (ref 0.9–1.1)
PT: 10.7 s (ref 10.1–12.9)
PTT: 29 s (ref 27–39)

## 2022-05-06 LAB — THYROID STIMULATING HORMONE (TSH), REFLEX ON ABNORMAL TO FREE T4, SERUM: TSH, Abn Reflex to Free T4, Serum: 1.51 u[IU]/mL (ref 0.35–4.94)

## 2022-05-06 NOTE — PEC In-Person Visit (H&P) (Addendum)
Pre-Anesthesia Evaluation     Pre-op Interview visit requested by:   Reason for pre-op interview visit: Patient anticipating L2-L3 XLIF, L2-L3 POSTERIOR DECOMPRESSION AND FUSION, LAMINECTOMY, POSTERIOR LUMBAR, DECOMP, INST, FUSION LEVEL1 procedure.         History of Present Illness/Summary:  Patient presents to the [facility] clinic for a pre-operative evaluation.    Assessment/Plan:    Encounter for Pre-Operative Examination (Z01.818)  Education was provided to patient regarding Ensure Pre-Surgery clear carbohydrate drink, CHG instructions, incentive spirometer use, preparing for surgery tips and pain management instructions.  The patient was given a Pre-Surgery packet containing Ensure Pre-Surgery clear carbohydrate drink, Hibiclens solution and an incentive spirometer.  Patient verbalizes understanding of ERAS education and instructions.  Orders Placed This Encounter   Procedures    Presurgical Surveillance, MSSA+MRSA    Presurgical Surveillance, MSSA+MRSA    Urine culture    CBC and differential    PT/APTT    TSH, Abn Reflex to Free T4, Serum    Urinalysis Reflex to Microscopic Exam- Reflex to Culture      Surgical Diagnosis: Lumbar radiculopathy [M54.16]    Hypertension: Well controlled.      CAD: Patient is currently taking ASA and Rosuvastatin. She denies CP, SOB, DOE, PND, or othopnea.    Hypothyroidism: Will send for TSH today.  ADDENDUM 05/07/22: TSH 1.51 and WNL.     PONV: Patient reports she has had nausea and vomiting after her hysterectomy procedure. Scopolamine patch contraindicated due to age.       Patient evaluated in [facility] today. Final determination pending lab results. Message sent to surgeon regarding recommendation to continue ASA perioperatively; awaiting response.  ADDENDUM 1630: Received message from surgery team and OK to continue ASA perioperatively.  ADDENDUM 05/07/22 0900: Labs reviewed. MSSA/MRSA results pending. U/A positive for leukocytes and nitrites with trace protein, small blood, RBC 26-50,  and WBC TNTC. Awaiting urine culture. Patient is at elevated risk for perioperative CV complications secondary to CAD. However, patient is at acceptable risk to proceed with the planned surgery.  ADDENDUM 05/08/22 0845: MSSA/MRSA swabs negative. Urine culture pending.  ADDENDUM 05/09/22 1415: Urine culture positive for E. Coli. Surgery team notified. Left VM for patient to discuss results; awaiting response.   ADDENDUM 05/10/22 0950: Discussed with patient and Augmentin 500/125mg  BID x7 days sent to patient's preferred pharmacy.       Patient voiced understanding of all instructions.  All questions and concerns addressed at this time. This assessment will be conveyed to the surgery and anesthesiology teams & the patient will be evaluated the morning of surgery.    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   04/10/2021    Numbness in both legs R20.0   04/10/2021    Lumbar radiculopathy M54.16   03/22/2022    Narcotic-induced nausea and vomiting R11.2, T40.605A   05/06/2022    Takotsubo cardiomyopathy I51.81   05/06/2022  Hypertension I10   05/06/2022    History of seizure Z87.898   05/06/2022    Overview Signed 05/06/2022  3:17 PM by Thresa Ross, DNP NP     x1 in 2001, not currently on meds at this time         Hypothyroidism E03.9   05/06/2022    Coronary artery disease I25.10   05/06/2022    Left atrial dilation I51.7   05/06/2022    Overview Signed 05/06/2022  3:43 PM by Thresa Ross, DNP NP     Mild on ECHO 03/18/22         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   05/06/2022    Overview Signed 05/06/2022  3:44 PM by  Thresa Ross, DNP NP     on ECHO 03/18/22           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   05/06/2022    Overview Signed 05/06/2022  3:44 PM by Thresa Ross, DNP NP     on ECHO 03/18/22           Mild mitral regurgitation I34.0   05/06/2022    Overview Signed 05/06/2022  3:44 PM by Thresa Ross, DNP NP     on ECHO 03/18/22           Mild tricuspid regurgitation I07.1   05/06/2022    Overview Signed 05/06/2022  3:44 PM by Thresa Ross, DNP NP     on ECHO 03/18/22           PONV (postoperative nausea and vomiting) R11.2, Z98.890   05/06/2022        Medical History   Diagnosis Date    Cervical stenosis of spinal canal     pre-op dx    Hypercholesterolemia     Hypothyroidism      Past Surgical History:   Procedure Laterality Date    APPENDECTOMY (OPEN)  1962    CESAREAN SECTION  1988    COLONOSCOPY  2013    normal    DISCECTOMY, ANTERIOR CERVICAL, FUSION, LEVELS 2 N/A 09/21/2020    Procedure: C5-C7 ACDF;  Surgeon: Marlaine Hind, MD;  Location: [facility];  Service: Neurosurgery;  Laterality: N/A;  C5-C7 ACDF    HYSTERECTOMY, ABDOMINAL, TOTAL  2005    LAMINECTOMY, ANTERIOR LUMBAR DECOMPRESSION, LEVEL 1  1971    PELVIC FLOOR REPAIR  2005        Medication List            Accurate as of May 06, 2022 11:59 PM. Always use your most recent med list.                ASPIRIN 81 PO  Take by mouth  Medication Adjustments for Surgery: Take as prescribed     cyanocobalamin 1000 MCG tablet  Take 1 tablet (1,000 mcg) by mouth daily  Medication Adjustments for Surgery: Hold day of surgery     losartan 25 MG tablet  Take 0.5 tablets (12.5 mg) by mouth daily  Commonly known as: COZAAR  Medication Adjustments for Surgery: Hold day of surgery     metoprolol succinate XL 25 MG 24 hr tablet  Take 0.5 tablets (12.5 mg) by mouth daily  Commonly known as: TOPROL-XL  Medication Adjustments for Surgery: Take as prescribed     REFRESH TEARS OP  Apply to eye  Medication Adjustments for Surgery: Take as prescribed     rosuvastatin  10 MG tablet  Take 1 tablet (10 mg) by mouth daily  Commonly known as: CRESTOR  Medication Adjustments for Surgery: Take as prescribed     solifenacin 10 MG tablet  Take 1 tablet (10 mg) by mouth daily  Commonly known as: VESICARE  Medication Adjustments for Surgery: Take as prescribed     spironolactone 25 MG tablet  Take 0.5 tablets (12.5 mg) by mouth daily  Commonly known as: ALDACTONE  Medication Adjustments for Surgery: Take as prescribed     Synthroid 88 MCG tablet  Take 1 tablet (88 mcg) by mouth daily  Generic drug: levothyroxine  Medication Adjustments for Surgery: Take as prescribed     TYLENOL PO  Take by mouth  Medication Adjustments for Surgery: Take as needed            No Known Allergies    Social History     Occupational History    Not on file   Tobacco Use    Smoking status: Never    Smokeless tobacco: Never   Vaping Use    Vaping Use: Never used   Substance and Sexual Activity    Alcohol use: Not Currently    Drug use: Not Currently    Sexual activity: Not Currently       Menstrual History:   LMP / Status  Postmenopausal     No LMP recorded. Patient is postmenopausal.    Tubal Ligation?  No valid surgical or medical questions entered.             Exam Scores:   SDB score  Risk Category: No Risk    PONV score  Nausea Risk: VERY SEVERE RISK    MST score  MST Score: 0    Allergy score  Risk Category: Low Risk    Frailty score  CFS Score: 3    MICA  MICA %: 0.45    RCRI score  RCRI Score: 1    DASI  DASI Score: 15.45  METs Level: 4.64    EA-DIVA  EA-DIVA Score: 0       Visit Vitals  BP 132/61   Pulse 76   Ht 1.575 m (5\' 2" )   Wt 55.8 kg (123 lb)   SpO2 93%   BMI 22.50 kg/m       Review of Systems   Constitutional: Negative.    HENT: Negative.     Eyes: Negative.    Respiratory: Negative.     Cardiovascular: Negative.    Gastrointestinal:  Positive for heartburn.   Genitourinary: Negative.    Musculoskeletal:  Positive for joint pain.   Skin: Negative.    Neurological: Negative.    Endo/Heme/Allergies:  Negative.    Psychiatric/Behavioral: Negative.         Physical Exam:  Mallampati: I  TM distance: > 3 FB (> 6 cm)  Mouth opening: > 3 FB (> 6 cm)  Neck extension: full  Upper lip bite assessment: class I    Dental comments: Patient reports multiple caps, unsure of location    Mental status: alert and oriented x3  Speech: normal    Normal cardiovascular exam  Heart rhythm: regular  no murmur:    (-) a murmur and peripheral edema    Normal pulmonary exam  Breath sounds clear to auscultation bilaterally        Normal abdominal exam.        Recent Labs   CBC (last 180 days) 05/06/22  1531   WBC 5.98   RBC 4.26   Hgb 13.6   Hematocrit 40.3  MCV 94.6   MCH 31.9   MCHC 33.7   RDW 12   Platelets 254   MPV 10.2   Nucleated RBC 0.0   Absolute NRBC 0.00         Recent Labs   Coag Panel (last 180 days) 05/06/22  1531   PTT 29   PT 10.7   PT INR 0.9     Recent Labs   Other (last 180 days) 05/06/22  1531   TSH, Abn Reflex to Free T4, Serum 1.51   Culture Staph and MRSA Surveillance No Staph aureus isolated and  No Methicillin Resistant Staph aureus isolated    No Staph aureus isolated and  No Methicillin Resistant Staph aureus isolated                 Labs CE 01/07/22:  CBC: WBC 10.2, H/H 13.7/41.1, Plt 245  CMP: Glucose 159, BUN 18, Cr 0.83, Na 138, K 3.5, Cl 105, CO2 23, Cal 9.4, Albumin 4.0, T bili 0.5, AST 14, ALT 13, Alk Phos 67    Cardiac Studies:  ECHO 03/18/22: EF 60-65%, there is mildly elevated pulmonary artery systolic pressure with RVSP 42.4 mmHg, LA is mildly dilated, mitral valve is myxomatous, mild late systolic prolapse of the middle segment of the anterior leaflet of the mitral valve, mild MR, mild TR, AR is not visualized, PR is not visualized  LHC 01/09/22:   Coronary Findings Diagnostic Dominance: Right   Left Main: Vessel is angiographically normal.   Left Anterior Descending: The vessel exhibits minimal luminal irregularities. Mid LAD lesion is 25% stenosed. Mild nonobstructive plaquing noted in the proximal  to mid LAD   Left Circumflex: Vessel is angiographically normal.   Right Coronary Artery: Vessel is large. Vessel is angiographically normal. Large, dominant RCA widely patent throughout its course     Anesthesia Plan:  ASA 3   Anesthetic Options Discussed: General  Potential anesthesia/Perioperative problems: Increased Risk of Cardiac Event and PONV    DNR status: Patient or surrogate requests that DNR status remain full code.  Discussed DNR status with: Patient      A discussion with regarding next steps to prepare for the procedure and the planned anesthesia care took place during today's visit.  I explained that the patient will meet with their anesthesiology providers on the DOS.  Discussed with Patient      Acceptability of blood products: Accepted  Use of blood products discussed with Patient

## 2022-05-07 LAB — PRESURGICAL SURVEILLANCE, MSSA+MRSA

## 2022-05-10 MED ORDER — AMOXICILLIN-POT CLAVULANATE 500-125 MG PO TABS
1.0000 | ORAL_TABLET | Freq: Two times a day (BID) | ORAL | 0 refills | Status: AC
Start: 2022-05-10 — End: 2022-05-17

## 2022-05-10 NOTE — Addendum Note (Signed)
Addended by: Thresa Ross on: 05/10/2022 09:52 AM     Modules accepted: Orders

## 2022-05-19 ENCOUNTER — Encounter: Payer: Self-pay | Admitting: Nurse Practitioner

## 2022-05-19 ENCOUNTER — Other Ambulatory Visit: Payer: Self-pay | Admitting: Nurse Practitioner

## 2022-05-19 MED ORDER — CALCITONIN (SALMON) 200 UNIT/ACT NA SOLN
1.0000 | Freq: Every day | NASAL | 6 refills | Status: AC
Start: 2022-05-19 — End: ?

## 2022-05-19 MED ORDER — MISC. DEVICES KIT
PACK | 0 refills | Status: AC
Start: 2022-05-19 — End: ?

## 2022-05-22 NOTE — H&P (Signed)
ADMISSION HISTORY AND PHYSICAL EXAM    Date Time: 05/22/22 1:46 PM  Patient Name: Stephanie Castaneda  Attending Physician: Marlaine Hind, MD  NEUROSURGERY TEAM A: Dr. Deloria Lair  *CALL PRIMARY TEAM FOR ALL OTHER ISSUES*    For all neurosurgical issues, please call:  1st Call: Neurosurgery Inpatient Team [phone])  2nd Call: Monday-Friday, 8AM-5PM: Bartolo Darter, PA-C [phone])  6PM-6AM: Night Coverage [phone])    Call Spectra #'s first. Do not page neurosurgery attending MD.    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     Plan:   XLIF L2-3/PSF L2-3    History of Present Illness:   Stephanie Castaneda is a 81 y.o. female who presents to the hospital with numbness in the RLE(knee to foot) and bilateral leg weakness that she feels is worsening. She reports that she has had several instances where she was close to falling due to the leg weakness. She received an L4/5 TFESI by Dr. Chandra Batch 11/2021. She has trialed and failed conservative management of symptoms. Therefore, she is here to proceed with proposed surgical intervention.    Past Medical History:     Past Medical History:   Diagnosis Date    Cervical stenosis of spinal canal     pre-op dx    Hypercholesterolemia     Hypothyroidism        Past Surgical History:     Past Surgical History:   Procedure Laterality Date    APPENDECTOMY (OPEN)  1962    CESAREAN SECTION  1988    COLONOSCOPY  2013    normal    DISCECTOMY, ANTERIOR CERVICAL, FUSION, LEVELS 2 N/A 09/21/2020    Procedure: C5-C7 ACDF;  Surgeon: Marlaine Hind, MD;  Location: [facility];  Service: Neurosurgery;  Laterality: N/A;  C5-C7 ACDF    HYSTERECTOMY, ABDOMINAL, TOTAL  2005    LAMINECTOMY, ANTERIOR LUMBAR DECOMPRESSION, LEVEL 1  1971    PELVIC FLOOR REPAIR  2005        Family History:     Family History   Problem Relation Age of Onset    Stroke Mother     Heart attack Father        Social History:     Social History     Socioeconomic History    Marital status: Married     Spouse name: Not on file    Number of children: Not on file    Years of education: Not on file    Highest education level: Not on file   Occupational History    Not on file   Tobacco Use    Smoking status: Never    Smokeless tobacco: Never   Vaping Use    Vaping Use: Never used   Substance and Sexual Activity    Alcohol use: Not Currently    Drug use: Not Currently    Sexual activity: Not Currently   Other Topics Concern    Not on file   Social History Narrative    Not on file     Social Determinants of Health     Financial Resource Strain: Low Risk  (04/08/2021)    Overall Financial Resource Strain (CARDIA)     Difficulty of Paying Living Expenses: Not very hard   Food Insecurity: No Food Insecurity (  04/08/2021)    Hunger Vital Sign     Worried About Running Out of Food in the Last Year: Never true     Ran Out of Food in the Last Year: Never true   Transportation Needs: No Transportation Needs (04/08/2021)    PRAPARE - Therapist, art (Medical): No     Lack of Transportation (Non-Medical): No   Physical Activity: Sufficiently Active (04/08/2021)    Exercise Vital Sign     Days of Exercise per Week: 6 days     Minutes of Exercise per Session: 50 min   Stress: No Stress Concern Present (04/08/2021)    [employer] of Occupational Health - Occupational Stress Questionnaire     Feeling of Stress : Only a little   Social Connections: Socially Integrated (04/08/2021)    Social Connection and Isolation Panel [NHANES]     Frequency of Communication with Friends and Family: More than three times a week     Frequency of Social Gatherings with Friends and Family: Once a week     Attends Religious Services: More than 4 times per year     Active Member of [employer] or Organizations: Yes     Attends  Engineer, structural: More than 4 times per year     Marital Status: Married   Catering manager Violence: Not At Risk (04/08/2021)    Humiliation, Afraid, Rape, and Kick questionnaire     Fear of Current or Ex-Partner: No     Emotionally Abused: No     Physically Abused: No     Sexually Abused: No   Housing Stability: Low Risk  (04/08/2021)    Housing Stability Vital Sign     Unable to Pay for Housing in the Last Year: No     Number of Places Lived in the Last Year: 1     Unstable Housing in the Last Year: No       Allergies:   No Known Allergies    Medications:     No medications prior to admission.       Review of Systems:   A review of systems is otherwise negative for all systems other than what has been indicated in the HPI.     Physical Exam:   There were no vitals filed for this visit.    Intake and Output Summary (Last 24 hours) at Date Time  No intake or output data in the 24 hours ending 05/22/22 1346    Cardiovascular: No murmurs, rubs, or gallops.  Regular rate and rhythm.  No peripheral edema  Respiratory: Auscultation clear bilaterally, no rhonchi, wheezes.  Chest expansion equal bilaterally.  Abdomen: non-distended, soft, and non-tender     Neuro exam:    Awake and alert. Oriented x 3   Fund of knowledge appropriate  Normal attention span, concentration, and language  Speech fluent     Cranial nerves:   CN II-XII grossly intact     Normal muscle bulk and tone  Motor:  RUE G 5/5  B 5/5 T 5/5 D 5/5  LUE  G 5/5  B 5/5 T 5/5 D 5/5  RLE IP 4/5 Q 5/5 H 5/5 DF 4-/5 PF 4+/5   LLE   IP 4/5 Q 5/5 H 5/5 DF 3/5 PF 4+/5      Sensory: Sensation intact to light touch decreased RLE- below knee   Gait: appears slightly unsteady  Assistive device: cane  No spontaneous abnormal movement,  tremor, dysmetria, or ataxia  Reflexes:   Patellar 1+ b/l  Achilles 1+ b/l  Labs:     Results       ** No results found for the last 24 hours. **            Labs reviewed    Rads:   Radiological Procedure reviewed.    Signed by:  Rondel Oh, NP        Pt seen examined and agree with above    [facility]

## 2022-05-22 NOTE — Discharge Instr - AVS First Page (Addendum)
Stephanie  F. Hamilton, MD      [facility]  [address]  ,  [address]    Phone:  [phone]     Fax:    [phone]    Postoperative  Instructions following Lumbar Back Fusion  Surgery    EXPECTATIONS of RECOVERY:  After surgery, you can expect to feel slightly weak and tired, but you should become stronger every day. It is normal to feel some discomfort around your incision, as well as in your legs  There may be some residual numbness and weakness after surgery, this may take months to go away, and may not ever completely go away .   Call us if the symptoms suddenly worsen after surgery. The pain will often increase right after surgery and then decrease over time.     ACTIVITY:  Do not lay directly on your back. Keep pressure off of your incision. You should sleep on your side or stomach as much as possible.  No bending or lifting anything greater than about  5-10 pounds  for the first several  weeks after surgery.   It is very important to get up and moving at home, but it is equally important to not overdo activities. Plan to have some assistance at home for the first few days. You will be able to walk, take stairs (carefully and slowly), use the bathroom, and get up from sitting to standing using the arms of the chair as support.   You may feel pressure when having a bowel movement if you had lower back surgery, and that is why we recommend using an over -the-counter  stool softener for the first few weeks after surgery.    You can start walking the day after  surgery and slowly increase your activity back to baseline.  Do not bend, twist or lean to pick things up or change positions. Remember good back mechanics and use your legs to sit down and stand up.    PHYSICAL THERAPY:  We will discuss PT at your first follow up visit.   Generally, PT will not begin until after your 3 month post-op visit when your brace is removed.     DRESSING CARE:  You may remove  the dressing 2 days after surgery. If your surgery was on a Monday, then on Wednesday evening you can remove the dressing. Leave the steri strips in place until they fall off themselves. Steri strips are little pieces of tape covering the wound. You will need someone to help you remove the dressing.  Keep your incision clean and dry at all times.   Do not apply any lotions , gel, ointments or other products to your incision unless directed.         SHOWERING:  You may shower on day 3 after the surgery. If your surgery was Monday, you may shower on Thursday.   You may remove your brace to shower,  but avoid bending, flexing or leaning over. You may need assistance.   Cover the wound with occlusive (waterproof) dressing for showering.   You should not let the water hit the incision directly for long periods of time, not should you soak the incision in a bathtub.   When you exit the shower carefully dry the wound with a towel and remove the waterproof dressing.  . You may then swipe an alcohol pad           over the area to further   assure it is completely dry. If the steri-strips are still in place then you may swipe the alcohol around the strips.     MEDICATION:  You will be prescribed pain medications.  You may have pain after the surgery , but will most likely only need to take an opioid  pain medication for a few  weeks. Keep in mind there is an opioid component to the medication and you should avoid driving or operating any vehicle after taking the medication. It can cause drowsiness as well as constipation. Over - the-counter stool softeners are recommended to avoid constipation. Continue taking the stool softeners while on pain medication to avoid any complications.   You may be prescribed calcitonin nasal spray and a bone stimulator to begin using after surgery. This will help bony fusion to take place at the area of your fusion.  If you use all of your pain medication and desire a refill, we will evaluate your  situation and you may need a special referral to a pain doctor at that time. Refills are limited to up to a max of 8 weeks.   You may take Tylenol (acetaminophen) for pain, but keep in mind there may be  acetaminophen in your opoid pain medication, so do not take both. Maximum dosage of acetaminophen per day is 4 grams or 4000 mg.   Do NOT take any NSAIDS such as Advil, Ibuprofen, Aleve, Motrin, Toradol, or Celebrex for at least 6 months post-surgery.  Your surgeon will advise you when to resume these medications.  You may resume your home medications as indicated on your discharge form.  You should NOT resume your blood thinning medication such as Coumadin, Heparin, Aggrenox, Plavix or any Aspirin containing products until instructed to do so by your surgeon.    DRIVING:  You are not cleared to drive until you are seen by Dr. Hamilton at six weeks after surgery. He will assess your ability to drive at that time.    SMOKING:  Smoking delays the healing  process; we request that you avoid smoking if possible.      EATING AND DRINKING:  You may resume a normal diet following your procedure.  Vitamins A, C, and Zinc/sulfate will help promote wound healing.  Please avoid alcoholic beverages while taking pain medication.      SEXUAL ACTIVITY:  Sexual activity is not advised until you have follow up with us in the clinic.  The jarring mechanism should be avoided for a short time port operatively.  After 10 days to 2 weeks you may resume activity with your back/neck supported by the bed/surface.     WORK:  Do not return to work until you have been advised to do so by staff. Generally we recommend taking 4 weeks off of work for recovery, but each individual situation will be reviewed independently.  We do not recommend you return to work until you have stopped taking opioids ( pain medication).  If you need short term disability or Family Medical Leave Act paperwork to be filled out, please have that available after  surgery.  We fill it out for 6 weeks, with possible extension to three months.  Anything greater than three months will have to be filled out by your primary care physician.  We do not fill out forms prior to surgery.     HOUSEWORK:  Avoid vacuuming or laundry until you have followed up in the clinic. The bending and lifting motion can place pressure on your   back and neck    STITCHES OR STAPLES:  You may or may not have stitches or staples.  We will inform you if you have them, for you will need to return to the office in 7-14 days for their removal.  You need to schedule a nurse visit at [phone] to  have them removed.  If you notice redness, swelling or drainage around your incision, or develop a fever > 101.5 F, please call the office immediately at [phone] for instructions.     FOLLOW -UP APPOINTMENTS (Routine)  You will follow up 10-14 days after surgery for a wound check with the clinic nurse.  You will not see your surgical team at this time.  You will see Rondel Oh, NP-C and /or Dr. Deloria Lair, 4-6 weeks after surgery for a post-op visit with x-rays.    Post-op at 3 months with x-rays, 6 months with x-rays, 1 year with x-rays and 18 months with x-rays     PROBLEMS or CONCERNS:  Please feel free to call the office at any time for problems, concerns, or questions you may have. Or you may email   Rondel Oh, NP-C at [email or web address] for non-emergent issues.  We are always available to help you or answer any questions.  In an EMERGENCY, please call 911 or go to the nearest Emergency Department.        Home Health Discharge Information     Your doctor has ordered Physical Therapy and Occupational Therapy in-home service(s) for you while you recuperate at home, to assist you in the transition from hospital to home.     The agency that you or your representative chose to provide the service:  Name of Home Health Agency Placement: [facility]][phone]           The above  services were set up by:  Guy Begin, RN (Home Health Liaison) Phone [phone]        IF YOU HAVE NOT HEARD FROM YOUR HOME YOUR HOME HEALTH AGENCY WITHIN 24-48 HOURS AFTER DISCHARGE PLEASE CALL YOUR AGENCY TO ARRANGE A TIME FOR YOUR FIRST VISIT. FOR ANY SCHEDULING CONCERNS OR QUESTIONS RELATED TO HOME HEALTH, SUCH AS TIME OR DATE PLEASE CONTACT YOUR HOME HEALTH AGENCY AT THE NUMBER LISTED ABOVE.     Additional comments:     Spoke to daughter Dr. Charlesetta Garibaldi and son Pilar Grammes on the phone for coordination of home health services , in agrement to PT and OT only , no Sn .  Home address as follows- [address] [place], [place]., [address]  DME - inplace      PCP- will arrange with [facility] - Dr Rachel Moulds ,    But following surgeon is Dr [name], MD

## 2022-05-24 ENCOUNTER — Inpatient Hospital Stay: Payer: Medicare PPO

## 2022-05-24 ENCOUNTER — Inpatient Hospital Stay: Payer: Medicare PPO | Admitting: Registered Nurse

## 2022-05-24 ENCOUNTER — Encounter
Admission: RE | Disposition: A | Discharge status: Home / Self Care | Payer: Self-pay | Source: Home / Self Care | Attending: Hospitalist

## 2022-05-24 ENCOUNTER — Inpatient Hospital Stay
Admission: RE | Admit: 2022-05-24 | Discharge: 2022-05-26 | DRG: 454 | Disposition: A | Discharge status: Home / Self Care | Payer: Medicare PPO | Attending: Hospitalist | Admitting: Hospitalist

## 2022-05-24 DIAGNOSIS — R079 Chest pain, unspecified: Secondary | ICD-10-CM | POA: Diagnosis not present

## 2022-05-24 DIAGNOSIS — M48062 Spinal stenosis, lumbar region with neurogenic claudication: Principal | ICD-10-CM | POA: Diagnosis present

## 2022-05-24 DIAGNOSIS — M4316 Spondylolisthesis, lumbar region: Secondary | ICD-10-CM | POA: Diagnosis present

## 2022-05-24 DIAGNOSIS — G834 Cauda equina syndrome: Secondary | ICD-10-CM | POA: Diagnosis present

## 2022-05-24 DIAGNOSIS — M4726 Other spondylosis with radiculopathy, lumbar region: Secondary | ICD-10-CM | POA: Diagnosis present

## 2022-05-24 DIAGNOSIS — M5416 Radiculopathy, lumbar region: Principal | ICD-10-CM | POA: Insufficient documentation

## 2022-05-24 DIAGNOSIS — I10 Essential (primary) hypertension: Secondary | ICD-10-CM | POA: Diagnosis present

## 2022-05-24 DIAGNOSIS — Z79890 Hormone replacement therapy (postmenopausal): Secondary | ICD-10-CM

## 2022-05-24 DIAGNOSIS — Z79899 Other long term (current) drug therapy: Secondary | ICD-10-CM

## 2022-05-24 DIAGNOSIS — Z8249 Family history of ischemic heart disease and other diseases of the circulatory system: Secondary | ICD-10-CM

## 2022-05-24 DIAGNOSIS — Z7982 Long term (current) use of aspirin: Secondary | ICD-10-CM

## 2022-05-24 DIAGNOSIS — M5136 Other intervertebral disc degeneration, lumbar region: Secondary | ICD-10-CM

## 2022-05-24 DIAGNOSIS — I2510 Atherosclerotic heart disease of native coronary artery without angina pectoris: Secondary | ICD-10-CM | POA: Diagnosis present

## 2022-05-24 DIAGNOSIS — I252 Old myocardial infarction: Secondary | ICD-10-CM

## 2022-05-24 DIAGNOSIS — E7800 Pure hypercholesterolemia, unspecified: Secondary | ICD-10-CM | POA: Diagnosis present

## 2022-05-24 DIAGNOSIS — M47816 Spondylosis without myelopathy or radiculopathy, lumbar region: Secondary | ICD-10-CM

## 2022-05-24 DIAGNOSIS — M48061 Spinal stenosis, lumbar region without neurogenic claudication: Secondary | ICD-10-CM

## 2022-05-24 DIAGNOSIS — Z981 Arthrodesis status: Secondary | ICD-10-CM

## 2022-05-24 DIAGNOSIS — M5116 Intervertebral disc disorders with radiculopathy, lumbar region: Secondary | ICD-10-CM | POA: Diagnosis present

## 2022-05-24 DIAGNOSIS — E039 Hypothyroidism, unspecified: Secondary | ICD-10-CM | POA: Diagnosis present

## 2022-05-24 HISTORY — PX: X-LIFT SPINAL PROCEDURE: SHX5732

## 2022-05-24 HISTORY — PX: LAMINECTOMY, POSTERIOR LUMBAR, DECOMP, INST, FUSION LEVEL 1: SHX4455

## 2022-05-24 HISTORY — PX: POSTERIOR FUSION LUMBAR SPINE: SUR632

## 2022-05-24 LAB — TYPE AND SCREEN
AB Screen Gel: NEGATIVE
ABO Rh: A POS

## 2022-05-24 LAB — BLOOD TYPE CONFIRMATION: Blood Type Confirmation: A POS

## 2022-05-24 LAB — HIGH SENSITIVITY TROPONIN-I: hs Troponin-I: 8.1 ng/L

## 2022-05-24 LAB — IHS 2ND ABORH REQUEST

## 2022-05-24 SURGERY — X-LIF SPINAL PROCEDURE
Anesthesia: Anesthesia General | Site: Spine Lumbar | Wound class: Clean

## 2022-05-24 MED ORDER — SUCCINYLCHOLINE CHLORIDE 20 MG/ML IJ SOLN
INTRAMUSCULAR | Status: DC | PRN
Start: 2022-05-24 — End: 2022-05-24
  Administered 2022-05-24: 60 mg via INTRAVENOUS

## 2022-05-24 MED ORDER — PHENYLEPHRINE 100 MCG/ML IV SYRINGE FOR INFUSION (ANESTHESIA)
PREFILLED_SYRINGE | INTRAVENOUS | Status: DC | PRN
Start: 2022-05-24 — End: 2022-05-24
  Administered 2022-05-24: 20 ug/min via INTRAVENOUS

## 2022-05-24 MED ORDER — CELECOXIB 200 MG PO CAPS
200.0000 mg | ORAL_CAPSULE | Freq: Once | ORAL | Status: AC
Start: 2022-05-24 — End: 2022-05-24
  Administered 2022-05-24: 200 mg via ORAL

## 2022-05-24 MED ORDER — ACETAMINOPHEN 500 MG PO TABS
1000.0000 mg | ORAL_TABLET | Freq: Once | ORAL | Status: AC
Start: 2022-05-24 — End: 2022-05-24
  Administered 2022-05-24: 1000 mg via ORAL

## 2022-05-24 MED ORDER — ROSUVASTATIN CALCIUM 10 MG PO TABS
10.0000 mg | ORAL_TABLET | Freq: Every day | ORAL | Status: DC
Start: 2022-05-25 — End: 2022-05-26
  Administered 2022-05-25 – 2022-05-26 (×2): 10 mg via ORAL
  Filled 2022-05-24 (×3): qty 1

## 2022-05-24 MED ORDER — GABAPENTIN 300 MG PO CAPS
ORAL_CAPSULE | ORAL | Status: AC
Start: 2022-05-24 — End: ?
  Filled 2022-05-24: qty 1

## 2022-05-24 MED ORDER — SOLIFENACIN SUCCINATE 5 MG PO TABS
10.0000 mg | ORAL_TABLET | Freq: Every day | ORAL | Status: DC
Start: 2022-05-25 — End: 2022-05-26
  Administered 2022-05-25 – 2022-05-26 (×2): 10 mg via ORAL
  Filled 2022-05-24: qty 2
  Filled 2022-05-24 (×2): qty 1
  Filled 2022-05-24 (×2): qty 2

## 2022-05-24 MED ORDER — CELECOXIB 200 MG PO CAPS
ORAL_CAPSULE | ORAL | Status: AC
Start: 2022-05-24 — End: ?
  Filled 2022-05-24: qty 1

## 2022-05-24 MED ORDER — METOPROLOL SUCCINATE ER 25 MG PO TB24
12.5000 mg | ORAL_TABLET | Freq: Every day | ORAL | Status: DC
Start: 2022-05-25 — End: 2022-05-26
  Administered 2022-05-25 – 2022-05-26 (×2): 12.5 mg via ORAL
  Filled 2022-05-24 (×3): qty 1

## 2022-05-24 MED ORDER — PROPOFOL INFUSION 10 MG/ML
INTRAVENOUS | Status: DC | PRN
Start: 2022-05-24 — End: 2022-05-24
  Administered 2022-05-24: 120 ug/kg/min via INTRAVENOUS

## 2022-05-24 MED ORDER — SPIRONOLACTONE 25 MG PO TABS
12.5000 mg | ORAL_TABLET | Freq: Every day | ORAL | Status: DC
Start: 2022-05-25 — End: 2022-05-26
  Administered 2022-05-25 – 2022-05-26 (×2): 12.5 mg via ORAL
  Filled 2022-05-24 (×3): qty 1

## 2022-05-24 MED ORDER — HYDROMORPHONE HCL 2 MG PO TABS
2.0000 mg | ORAL_TABLET | Freq: Once | ORAL | Status: DC | PRN
Start: 2022-05-24 — End: 2022-05-25

## 2022-05-24 MED ORDER — CALCIUM CHLORIDE 10 % IV SOLN
INTRAVENOUS | Status: DC | PRN
Start: 2022-05-24 — End: 2022-05-24
  Administered 2022-05-24: 1 g via INTRAVENOUS

## 2022-05-24 MED ORDER — HYDROMORPHONE HCL 1 MG/ML IJ SOLN
INTRAMUSCULAR | Status: DC | PRN
Start: 2022-05-24 — End: 2022-05-24
  Administered 2022-05-24: .2 mg via INTRAVENOUS

## 2022-05-24 MED ORDER — METOPROLOL SUCCINATE ER 25 MG PO TB24
25.0000 mg | ORAL_TABLET | Freq: Every day | ORAL | Status: DC
Start: 2022-05-24 — End: 2022-05-25

## 2022-05-24 MED ORDER — DIPHENHYDRAMINE HCL 50 MG/ML IJ SOLN
12.5000 mg | Freq: Once | INTRAMUSCULAR | Status: DC | PRN
Start: 2022-05-24 — End: 2022-05-25

## 2022-05-24 MED ORDER — TOBRAMYCIN SULFATE 80 MG/2ML IJ SOLN
INTRAMUSCULAR | Status: DC | PRN
Start: 2022-05-24 — End: 2022-05-24
  Administered 2022-05-24: 240 mg

## 2022-05-24 MED ORDER — HYDROMORPHONE HCL 0.5 MG/0.5 ML IJ SOLN
0.5000 mg | INTRAMUSCULAR | Status: DC | PRN
Start: 2022-05-24 — End: 2022-05-25
  Administered 2022-05-25: 0.5 mg via INTRAVENOUS

## 2022-05-24 MED ORDER — HYDRALAZINE HCL 20 MG/ML IJ SOLN
5.0000 mg | Freq: Once | INTRAMUSCULAR | Status: AC
Start: 2022-05-24 — End: 2022-05-24

## 2022-05-24 MED ORDER — HYDRALAZINE HCL 20 MG/ML IJ SOLN
INTRAMUSCULAR | Status: AC
Start: 2022-05-24 — End: 2022-05-24
  Administered 2022-05-24: 5 mg via INTRAVENOUS
  Filled 2022-05-24: qty 1

## 2022-05-24 MED ORDER — ONDANSETRON HCL 4 MG/2ML IJ SOLN
INTRAMUSCULAR | Status: DC | PRN
Start: 2022-05-24 — End: 2022-05-24
  Administered 2022-05-24: 4 mg via INTRAVENOUS

## 2022-05-24 MED ORDER — REMIFENTANIL HCL 1 MG IV SOLR
INTRAVENOUS | Status: DC | PRN
Start: 2022-05-24 — End: 2022-05-24
  Administered 2022-05-24: .15 ug/kg/min via INTRAVENOUS

## 2022-05-24 MED ORDER — PROMETHAZINE HCL 25 MG/ML IJ SOLN
6.2500 mg | Freq: Once | INTRAMUSCULAR | Status: DC | PRN
Start: 2022-05-24 — End: 2022-05-25

## 2022-05-24 MED ORDER — FAMOTIDINE 10 MG/ML IV SOLN (WRAP)
INTRAVENOUS | Status: DC | PRN
Start: 2022-05-24 — End: 2022-05-24
  Administered 2022-05-24: 20 mg via INTRAVENOUS

## 2022-05-24 MED ORDER — FENTANYL CITRATE (PF) 50 MCG/ML IJ SOLN (WRAP)
INTRAMUSCULAR | Status: DC | PRN
Start: 2022-05-24 — End: 2022-05-24
  Administered 2022-05-24: 25 ug via INTRAVENOUS
  Administered 2022-05-24: 50 ug via INTRAVENOUS
  Administered 2022-05-24: 25 ug via INTRAVENOUS

## 2022-05-24 MED ORDER — VANCOMYCIN HCL 1 G IV SOLR
INTRAVENOUS | Status: AC
Start: 2022-05-24 — End: ?
  Filled 2022-05-24: qty 1000

## 2022-05-24 MED ORDER — FENTANYL CITRATE (PF) 50 MCG/ML IJ SOLN (WRAP)
25.0000 ug | INTRAMUSCULAR | Status: AC | PRN
Start: 2022-05-24 — End: 2022-05-24
  Administered 2022-05-24 (×3): 25 ug via INTRAVENOUS

## 2022-05-24 MED ORDER — THROMBIN 5000 UNITS EX SOLR (WRAP)
CUTANEOUS | Status: AC
Start: 2022-05-24 — End: ?
  Filled 2022-05-24: qty 15000

## 2022-05-24 MED ORDER — METOPROLOL SUCCINATE ER 25 MG PO TB24
25.0000 mg | ORAL_TABLET | Freq: Every day | ORAL | Status: DC
Start: 2022-05-24 — End: 2022-05-25
  Filled 2022-05-24: qty 1

## 2022-05-24 MED ORDER — LIDOCAINE 4 % EX CREA
TOPICAL_CREAM | Freq: Once | CUTANEOUS | Status: DC | PRN
Start: 2022-05-24 — End: 2022-05-25

## 2022-05-24 MED ORDER — ACETAMINOPHEN 500 MG PO TABS
ORAL_TABLET | ORAL | Status: AC
Start: 2022-05-24 — End: ?
  Filled 2022-05-24: qty 2

## 2022-05-24 MED ORDER — ALBUMIN HUMAN/BIOSIMILIAR 5% IV SOLN (WRAP)
INTRAVENOUS | Status: DC | PRN
Start: 2022-05-24 — End: 2022-05-24

## 2022-05-24 MED ORDER — LACTATED RINGERS IV SOLN
INTRAVENOUS | Status: DC
Start: 2022-05-24 — End: 2022-05-25

## 2022-05-24 MED ORDER — LEVOTHYROXINE SODIUM 88 MCG PO TABS
88.0000 ug | ORAL_TABLET | Freq: Every day | ORAL | Status: DC
Start: 2022-05-25 — End: 2022-05-26
  Administered 2022-05-25 – 2022-05-26 (×2): 88 ug via ORAL
  Filled 2022-05-24 (×3): qty 1

## 2022-05-24 MED ORDER — DEXAMETHASONE SODIUM PHOSPHATE 4 MG/ML IJ SOLN (WRAP)
INTRAMUSCULAR | Status: DC | PRN
Start: 2022-05-24 — End: 2022-05-24
  Administered 2022-05-24: 8 mg via INTRAVENOUS

## 2022-05-24 MED ORDER — HYDROMORPHONE HCL 1 MG/ML IJ SOLN
INTRAMUSCULAR | Status: AC
Start: 2022-05-24 — End: 2022-05-24
  Administered 2022-05-24: 0.5 mg via INTRAVENOUS
  Filled 2022-05-24: qty 1

## 2022-05-24 MED ORDER — CEFAZOLIN SODIUM-DEXTROSE 2-3 GM-%(50ML) IV SOLR
INTRAVENOUS | Status: AC
Start: 2022-05-24 — End: ?
  Filled 2022-05-24: qty 50

## 2022-05-24 MED ORDER — VANCOMYCIN HCL 1 G IV SOLR
INTRAVENOUS | Status: DC | PRN
Start: 2022-05-24 — End: 2022-05-24
  Administered 2022-05-24: 1000 mg via INTRAVENOUS

## 2022-05-24 MED ORDER — MEPERIDINE HCL 25 MG/ML IJ SOLN
12.5000 mg | Freq: Once | INTRAMUSCULAR | Status: DC | PRN
Start: 2022-05-24 — End: 2022-05-25

## 2022-05-24 MED ORDER — LIDOCAINE HCL 2 % IJ SOLN
INTRAMUSCULAR | Status: DC | PRN
Start: 2022-05-24 — End: 2022-05-24
  Administered 2022-05-24: 50 mg via INTRAVENOUS

## 2022-05-24 MED ORDER — LIDOCAINE-EPINEPHRINE 1.5 %-1:200000 IJ SOLN
INTRAMUSCULAR | Status: DC | PRN
Start: 2022-05-24 — End: 2022-05-24
  Administered 2022-05-24: 10 mL via INTRADERMAL

## 2022-05-24 MED ORDER — GABAPENTIN 300 MG PO CAPS
300.0000 mg | ORAL_CAPSULE | Freq: Once | ORAL | Status: AC
Start: 2022-05-24 — End: 2022-05-24
  Administered 2022-05-24: 300 mg via ORAL

## 2022-05-24 MED ORDER — LIDOCAINE HCL 1 % IJ SOLN
1.0000 mL | Freq: Once | INTRAMUSCULAR | Status: DC | PRN
Start: 2022-05-24 — End: 2022-05-25

## 2022-05-24 MED ORDER — ROCURONIUM BROMIDE 10 MG/ML IV SOLN (WRAP)
INTRAVENOUS | Status: DC | PRN
Start: 2022-05-24 — End: 2022-05-24
  Administered 2022-05-24: 10 mg via INTRAVENOUS

## 2022-05-24 MED ORDER — THROMBIN 5000 UNITS EX SOLR (WRAP)
CUTANEOUS | Status: DC | PRN
Start: 2022-05-24 — End: 2022-05-24
  Administered 2022-05-24: 5000 [IU] via TOPICAL
  Administered 2022-05-24: 15000 [IU] via TOPICAL

## 2022-05-24 MED ORDER — TOBRAMYCIN SULFATE 80 MG/2ML IJ SOLN
INTRAMUSCULAR | Status: AC
Start: 2022-05-24 — End: ?
  Filled 2022-05-24: qty 6

## 2022-05-24 MED ORDER — MAGNESIUM SULFATE 50 % IJ SOLN
INTRAMUSCULAR | Status: DC | PRN
Start: 2022-05-24 — End: 2022-05-24
  Administered 2022-05-24 (×2): 1 g via INTRAVENOUS

## 2022-05-24 MED ORDER — STERILE WATER FOR INJECTION IJ/IV SOLN (WRAP)
2.0000 g | INTRAMUSCULAR | Status: AC
Start: 2022-05-24 — End: 2022-05-24
  Administered 2022-05-24 (×2): 2 g via INTRAVENOUS

## 2022-05-24 MED ORDER — ONDANSETRON HCL 4 MG/2ML IJ SOLN
4.0000 mg | Freq: Once | INTRAMUSCULAR | Status: DC | PRN
Start: 2022-05-24 — End: 2022-05-25

## 2022-05-24 MED ORDER — SODIUM CHLORIDE 0.9 % IR SOLN
Status: DC | PRN
Start: 2022-05-24 — End: 2022-05-24
  Administered 2022-05-24: 1000 mL

## 2022-05-24 MED ORDER — FENTANYL CITRATE (PF) 50 MCG/ML IJ SOLN (WRAP)
INTRAMUSCULAR | Status: AC
Start: 2022-05-24 — End: 2022-05-24
  Administered 2022-05-24: 25 ug via INTRAVENOUS
  Filled 2022-05-24: qty 2

## 2022-05-24 MED ORDER — PROPOFOL 10 MG/ML IV EMUL (WRAP)
INTRAVENOUS | Status: DC | PRN
Start: 2022-05-24 — End: 2022-05-24
  Administered 2022-05-24: 120 mg via INTRAVENOUS

## 2022-05-24 MED ORDER — GLYCOPYRROLATE 0.2 MG/ML IJ SOLN (WRAP)
INTRAMUSCULAR | Status: DC | PRN
Start: 2022-05-24 — End: 2022-05-24
  Administered 2022-05-24: .1 mg via INTRAVENOUS

## 2022-05-24 MED ORDER — OXIDIZED CELLULOSE EX PADS
MEDICATED_PAD | CUTANEOUS | Status: DC | PRN
Start: 2022-05-24 — End: 2022-05-24
  Administered 2022-05-24: 1 via TOPICAL

## 2022-05-24 MED ORDER — CARBOXYMETHYLCELLULOSE SODIUM 0.5 % OP SOLN
1.0000 [drp] | Freq: Two times a day (BID) | OPHTHALMIC | Status: DC
Start: 2022-05-25 — End: 2022-05-25
  Filled 2022-05-24 (×3): qty 1

## 2022-05-24 MED ORDER — VITAMIN B-12 500 MCG PO TABS
1000.0000 ug | ORAL_TABLET | Freq: Every day | ORAL | Status: DC
Start: 2022-05-25 — End: 2022-05-26
  Filled 2022-05-24 (×2): qty 2

## 2022-05-24 MED ORDER — GELATIN ABSORBABLE 100 EX MISC
CUTANEOUS | Status: DC | PRN
Start: 2022-05-24 — End: 2022-05-24
  Administered 2022-05-24: 1 via TOPICAL

## 2022-05-24 MED ORDER — CALCITONIN (SALMON) 200 UNIT/ACT NA SOLN
1.0000 | Freq: Every day | NASAL | Status: DC
Start: 2022-05-25 — End: 2022-05-26
  Administered 2022-05-25 – 2022-05-26 (×2): 1 via NASAL
  Filled 2022-05-24: qty 3.7

## 2022-05-24 MED ORDER — HEPARIN SODIUM (PORCINE) 1000 UNIT/ML IJ SOLN
INTRAMUSCULAR | Status: DC | PRN
Start: 2022-05-24 — End: 2022-05-24
  Administered 2022-05-24: 10000 [IU]

## 2022-05-24 MED ORDER — EPHEDRINE SULFATE 50 MG/ML IJ/IV SOLN (WRAP)
Status: DC | PRN
Start: 2022-05-24 — End: 2022-05-24
  Administered 2022-05-24: 10 mg via INTRAVENOUS

## 2022-05-24 MED ORDER — LOSARTAN POTASSIUM 25 MG PO TABS
12.5000 mg | ORAL_TABLET | Freq: Every day | ORAL | Status: DC
Start: 2022-05-25 — End: 2022-05-26
  Administered 2022-05-25 – 2022-05-26 (×2): 12.5 mg via ORAL
  Filled 2022-05-24 (×3): qty 1

## 2022-05-24 SURGICAL SUPPLY — 191 items
ADHESIVE LIQUID WATERPROOF VIAL PREP NONSTAIN MASTISOL STYRAX GUM (Skin Closure) ×2 IMPLANT
ADHESIVE LQ STYRAX GUM MASTIC ALC MTHY (Skin Closure) ×2 IMPLANT
AGENT FLSL RECOTHROM 10ML (Hemostat) ×4
AGENT HEMOSTATIC MATRIX RECOTHROM (Hemostat) ×4 IMPLANT
AGENT HEMOSTATIC MATRIX RECOTHROM FLOSEAL ADS202110 (Hemostat) ×4 IMPLANT
ATTACHMENT SMKEVC ACVC STRL FLXB CAUT (Tubing) IMPLANT
ATTACHMENT SMOKE EVACUATOR FLEXIBLE CAUTERY HANDSWITCH ACCUVAC (Tubing) IMPLANT
BANDAGE ADH PLSTR POLYACRYLATE CVRL 2YD (Dressing)
BANDAGE ADHESIVE L2 YD X W6 IN STRETCH (Dressing) IMPLANT
BANDAGE ADHESIVE L2 YD X W6 IN STRETCH NONWOVEN POROUS COVER-ROLL (Dressing) IMPLANT
BLADE 11 BARD-PARKER RIB-BACK CARBON (Blade) IMPLANT
BLADE 11 BARD-PARKER RIB-BACK CARBON STEEL TISSUE SURGICAL (Blade) IMPLANT
BLADE SRG CBNSTL 11 BP RB-BCK LF STRL (Blade)
BULB DRAINAGE LIGHTWEIGHT LOW LEVEL (Drain) IMPLANT
BULB DRAINAGE LIGHTWEIGHT LOW LEVEL SUCTION RELIAVAC SILICONE 100 CC (Drain) IMPLANT
BULB DRN SIL 100CC LF STRL LTWT LO LVL (Drain)
CAGE TIMBERLINE MPF 45X18X8MM 0D (Cage) ×2 IMPLANT
CAP IV FEMALE LUER LOCK DEHP FREE (Drain) IMPLANT
CATHETER IV JELCO 14GA 2IN STRL RADOPQ (IV Supply) ×2
CATHETER IV OD14 GA L2 IN RADIOPAQUE (IV Supply) ×2 IMPLANT
CATHETER IV OD14 GA L2 IN RADIOPAQUE JELCO (IV Supply) ×2 IMPLANT
COVER STAND W23 IN REINFORCE PLASTIC (Drape) IMPLANT
COVER STND PLS MAYO CNVRT 23IN LF STRL (Drape) IMPLANT
COVER TABLE L90 IN X W65 IN REINFORCE (Drape) IMPLANT
COVER TABLE L90 IN X W65 IN REINFORCE HEAVY DUTY CONVERTORS POLY (Drape) IMPLANT
COVER TBL POLY CNVRT 90X65IN STRL REINF (Drape)
DRAIN INCS SIL RND 3/32IN 49IN LF STRL (Drain)
DRAPE EQP C-ARMOR STRL XPD CLPSBL CARM (Drape) ×4 IMPLANT
DRAPE EXPAND COLLAPSIBLE EQUIPMENT C-ARMOR C ARM FLUOROSCOPE STERILE (Drape) ×4 IMPLANT
DRAPE INST 20X16IN LF STRL MAG DISP GRN (Drape) ×2
DRAPE INSTRUMENT L20 IN X W16 IN MAGNETIC GREEN (Drape) ×2 IMPLANT
DRAPE INSTRUMENT MAGNETIC L20 IN X W16 (Drape) ×2 IMPLANT
DRAPE SRG IOBN 23X17IN STRL 2 INCS FLM (Drape) ×2
DRAPE SRG PE STRDRP 17X11IN LF STRL ADH (Drape) ×2 IMPLANT
DRAPE SRG TBRN CNVRT 122X106X77IN LF (Drape) ×2 IMPLANT
DRAPE SRG TBRN XL CNVRT 98X77IN LF STRL (Drape) ×6
DRAPE SURGICAL 2 INCISE FILM (Drape) ×2 IMPLANT
DRAPE SURGICAL ADHESIVE STRIP SMALL (Drape) ×2
DRAPE SURGICAL ADHESIVE STRIP SMALL TOWEL MATTE FINISH L17 IN X W11 IN (Drape) ×2 IMPLANT
DRAPE SURGICAL IMPERVIOUS REINFORCEMENT FENESTRATE ABSORBENT ARMBOARD (Drape) ×2 IMPLANT
DRAPE SURGICAL SHEET FANFOLD L98 IN X (Drape) ×6 IMPLANT
DRAPE SURGICAL SHEET FANFOLD L98 IN X W77 IN TIBURON XL PINK (Drape) ×6 IMPLANT
DRESSING TRANSPARENT L4 3/4 IN X W4 IN (Dressing) ×2 IMPLANT
DRESSING TRANSPARENT L4 3/4 IN X W4 IN POLYURETHANE ADHESIVE (Dressing) ×2 IMPLANT
DRESSING TRNS PU STD TGDRM 4.75X4IN LF (Dressing) ×2
ELECTRODE ADULT PATIENT RETURN L9 FT REM POLYHESIVE ACRYLIC FOAM (Procedure Accessories) ×2 IMPLANT
ELECTRODE ELECTROSURGICAL BLADE L125 MM (Endoscopic Supplies) ×2 IMPLANT
ELECTRODE ELECTROSURGICAL BLADE L125 MM NEPTUNE E-SEP INSULATE COATED (Endoscopic Supplies) ×2 IMPLANT
ELECTRODE ELECTROSURGICAL BLADE L4 IN (Cautery) ×2 IMPLANT
ELECTRODE ELECTROSURGICAL BLADE L4 IN OD3/32 IN EDGE L.2 IN INSULATE (Cautery) ×2 IMPLANT
ELECTRODE ESURG BLDE EDG 3/32IN 4IN LF (Cautery) ×2
ELECTRODE ESURG BLDE NPTN E-SEP 125MM LF (Endoscopic Supplies) ×2
ELECTRODE PATIENT RETURN L9 FT VALLEYLAB (Procedure Accessories) ×2 IMPLANT
ELECTRODE PT RTN RM PHSV ACRL FM C30- LB (Procedure Accessories) ×2
END CAP SPNL TI TIMBERLINE 8-14MM 2 HL (Cover) ×2 IMPLANT
END CAP TIMBERLINE L8-14 MM 2 HOLE COVER (Cover) ×2 IMPLANT
EXTENDER BNGF HYALURONIC ACD POLY (Graft) ×4 IMPLANT
EXTENDER BONE GRAFT 10 CC PASTE MIX PLUS INQU HYALURONIC ACID POLY (Graft) ×4 IMPLANT
FILLER BN VOID CA SLF 10CC STIMULAN (Cement) ×2 IMPLANT
FILLER BONE VOID 10CC CALCIUM SULFATE STIMULAN RAPID CURE KIT PST BEAD (Cement) ×2 IMPLANT
FORCEPS ELECTROSURGICAL L7 3/4 IN 1.5 MM BAYONET BIPOLAR INSULATE CORD (Disposable Instruments) ×2 IMPLANT
FORCEPS ESURG SS 1.5MM BYNT LBRTY S-G (Disposable Instruments) ×2 IMPLANT
GLOVE SRG 9 BGL PI INDCTR UNDERGLOVE 301 (Glove) ×4
GLOVE SRG PLISPRN 9 BGL PI MIC 301MM LF (Glove) ×4
GLOVE SURGICAL 9 BIOGEL PI INDICATOR (Glove) ×4 IMPLANT
GLOVE SURGICAL 9 BIOGEL PI INDICATOR UNDERGLOVE POWDER FREE SMOOTH (Glove) ×4 IMPLANT
GLOVE SURGICAL 9 BIOGEL PI MICRO POWDER (Glove) ×4 IMPLANT
GLOVE SURGICAL 9 BIOGEL PI MICRO POWDER FREE ANTISLIP MICRO ROUGHEN (Glove) ×4 IMPLANT
GOWN SRG 2XL SMARTGOWN LF STRL LVL 4 (Gown) ×4
GOWN SURGICAL 2XL SMARTGOWN LEVEL 4 (Gown) ×4 IMPLANT
GOWN SURGICAL 2XL SMARTGOWN LEVEL 4 BREATHABLE (Gown) ×4 IMPLANT
GRAFT BN 10CC INFLUX SPARC ALGRF DMSO FR (Graft) ×2 IMPLANT
GRAFT BN 5CC PRIMAGEN ALGRF ADV (Allograft) ×2 IMPLANT
GRAFT BONE 10 CC ALLOGRAFT DMSO FREE (Graft) ×2 IMPLANT
GRAFT BONE 10 CC ALLOGRAFT DMSO FREE INFLUX SPARC (Graft) ×2 IMPLANT
GRAFT BONE 5 CC ALLOGRAFT PRIMAGEN (Allograft) ×2 IMPLANT
GRAFT BONE 5 CC ALLOGRAFT PRIMAGEN ADVANCED (Allograft) ×2 IMPLANT
GRAFT INFUSE BN KT XX SM (Graft) ×2 IMPLANT
INSTRUMENT SPINAL STANDARD CLOSURE TOP TORQUE LIMIT OD5.5-6 MM (Screw) ×8 IMPLANT
KIT BONE GRAFT 2XS INFUSE (Graft) ×2 IMPLANT
KIT PREP LABORATORY ERYTHROCYTE AND MARROW MAGELLAN MAR01 (Kits) ×2 IMPLANT
KIT PREP MARROW (Kits) ×2 IMPLANT
KIT SPINAL FUSION LATERAL TIMBERLINE 87009112 (Kits) ×2 IMPLANT
KIT TIMBERLINE ACCESS (Kits) ×2 IMPLANT
LEADWIRE EKG L6 FT MULTI STAGE TOUCHPROOF CONNECTOR OD3/4 IN (Procedure Accessories) ×2 IMPLANT
LEADWIRE MULTI-STAGE 72IN (Procedure Accessories) ×2 IMPLANT
NEEDLE BLNT STD 18GA 1.5IN LF FLTR (Needle) ×2
NEEDLE HPO STD 25GA 1.5IN LF REG BVL (Needles) ×2
NEEDLE HYPODERMIC L1.5 IN OD25 GA (Needles) ×2 IMPLANT
NEEDLE HYPODERMIC L1.5 IN OD25 GA STANDARD REGULAR BEVEL (Needles) ×2 IMPLANT
NEEDLE L1.5 IN OD18 GA FILTER BLUNT (Needle) ×2 IMPLANT
NEEDLE L1.5 IN OD18 GA FILTER BLUNT STANDARD (Needle) ×2 IMPLANT
NEEDLE SPINAL BD OD18 GA L3 1/2 IN (Needles) IMPLANT
NEEDLE SPINAL L3 1/2 IN REGULAR WALL QUINCKE TIP OD18 GA BD (Needles) IMPLANT
NEEDLE SPNL PP RW BD QNCK 18GA 3.5IN LF (Needles)
PACK SRG TBRN UNV LF STRL ABS REINF ADH (Pack) ×2
PACK SURGICAL UNIVERSAL ABSORBENT (Pack) ×2 IMPLANT
PACK SURGICAL UNIVERSAL ABSORBENT REINFORCE ADHESIVE CORD HOLD STRAP (Pack) ×2 IMPLANT
PLATE COVER 2 HOLE 8-14 PLATE SIZE TIMBERLINE TITANIUM SPINE (Cover) ×2 IMPLANT
PLATE L21 MM 8 MM SPINE 2 HOLE LOW (Plate) ×2 IMPLANT
PLATE L21 MM 8 SPINE 2 HOLE LOW PROFILE TITANIUM TIMBERLINE BONE (Plate) ×2 IMPLANT
PLATE TIMBERLINE MPF 2H SZ8 (Plate) ×2 IMPLANT
POSITIONER HEAD FOAM SLOTTED (Positioning Supplies) ×2
POSITIONER OR L8.5 IN X W8 IN X H4 IN (Positioning Supplies) ×2 IMPLANT
POSITIONER OR L8.5 IN X W8 IN X H4 IN HIGH RESILIENT SLOT FOAM HEAD (Positioning Supplies) ×2 IMPLANT
PROBE DISP BALL TIP 200MM (Instrument) ×2 IMPLANT
PROBE STIMULATOR DIRECT NERVE MONOPOLAR BALL TIP 200MM 302430000200 (Instrument) ×2 IMPLANT
ROD SPINAL VITALITY L50 MM OD5.5 MM (Rods) ×4 IMPLANT
ROD SPINAL VITALITY L50 MM OD5.5 MM LARGE HEX END TITANIUM CURVE (Rods) ×4 IMPLANT
ROD SPNL TI CRV VTL 5.5MM 50MM LG HEX (Rods) ×4 IMPLANT
SCREW BN VTL 4MM 45MM NS PA SPNE (Screw) ×8 IMPLANT
SCREW L30 MM OD5.5 MM TITANIUM SPINE (Screw) ×4 IMPLANT
SCREW L30 MM OD5.5 MM TITANIUM SPINE SELF TAPPING LOW PROFILE VARIABLE (Screw) ×4 IMPLANT
SCREW L45 MM OD4 MM SPINE THORACOLUMBAR (Screw) ×8 IMPLANT
SCREW L45 MM OD4 MM TITANIUM SPINE THORACOLUMBAR POLYAXIAL BONE (Screw) ×8 IMPLANT
SCREW SPINAL STANDARD TORQUE LIMIT (Screw) ×8 IMPLANT
SCREW SPNL TI STD VTL TRQ LMT SPNE (Screw) ×8 IMPLANT
SCREW TIMBERLINE VAR 5.5X30MM (Screw) ×4 IMPLANT
SOL NACL INJ 0.9% 30ML BACTER (IV Solutions)
SOLUTION IRR 0.9% NACL 1000ML LF STRL (Irrigation Solutions) ×2
SOLUTION IRRIGATION 0.9% SODIUM CHLORIDE (Irrigation Solutions) ×2 IMPLANT
SOLUTION IRRIGATION 0.9% SODIUM CHLORIDE 1000 ML PLASTIC POUR BOTTLE (Irrigation Solutions) ×2 IMPLANT
SOLUTION IV 0.9% SODIUM CHLORIDE 30 ML (IV Solutions) IMPLANT
SOLUTION IV 0.9% SODIUM CHLORIDE 30 ML BACTERIOSTATIC MULTIPLE DOSE (IV Solutions) IMPLANT
SOLUTION SRGPRP 74% ISPRP 0.7% IOD (Prep) ×8
SOLUTION SURGICAL PREP 26 ML DURAPREP (Prep) ×8 IMPLANT
SOLUTION SURGICAL PREP 26 ML DURAPREP 74% ISOPROPYL ALCOHOL 0.7% (Prep) ×8 IMPLANT
SPACER L45 MM X W18 MM X H4 MM 0DEG LORDOTIC TIMBERLINE PEEK-OPTIMA (Cage) ×2 IMPLANT
SPACER LATERAL MODULAR PLATE FIXATION (Cage) ×2 IMPLANT
SPNG ABSORBABLE GELATIN (Hemostat) ×2
SPONGE ABSORBABLE L12.5 CM X W8 CM (Hemostat) ×2 IMPLANT
SPONGE ABSORBABLE L12.5 CM X W8 CM PORCINE GELATIN SURGIFOAM THK10 MM (Hemostat) ×2 IMPLANT
SPONGE GAUZE L4 IN X W4 IN 12 PLY CURITY (Sponge) IMPLANT
SPONGE GAUZE L4 IN X W4 IN 12 PLY WOVEN (Dressing) ×2 IMPLANT
SPONGE GAUZE L4 IN X W4 IN 12 PLY WOVEN FOLD EDGE XRAY DETECT COTTON (Dressing) ×2 IMPLANT
SPONGE GZE CTTN 4X4IN LF STRL 12 PLY WVN (Dressing) ×2
SPONGE GZE CTTN CRTY 4X4IN LF STRL 12 (Sponge)
SPONGE LAP CTTN 18X18IN LF STRL 4 PLY (Sponge)
SPONGE LAPAROTOMY L18 IN X W18 IN 4 PLY (Sponge) IMPLANT
SPONGE LAPAROTOMY L18 IN X W18 IN 4 PLY RADIOPAQUE PYRONEMA FREE HIGH (Sponge) IMPLANT
SPONGE SRG VISTEC 8X4IN LF STRL 12 PLY (Sponge)
SPONGE SURGICAL L8 IN X W4 IN 12 PLY (Sponge) IMPLANT
SPONGE SURGICAL L8 IN X W4 IN 12 PLY RADIOPAQUE BAND VISTEC BLUE WHITE (Sponge) IMPLANT
SPONGE SURGICEL HEMO 4X8IN (Hemostat) ×4 IMPLANT
STAPLER SKIN 35 COUNT REGULAR STAPLE (Staplers) ×2 IMPLANT
STAPLER SKIN 35 COUNT REGULAR STAPLE CARTRIDGE ERGONOMIC HANDLE (Staplers) ×2 IMPLANT
STAPLER SKN SS PLS APPOSE LF 35 CNT REG (Staplers) ×2
STRIP SKIN CLOSURE L4 IN X W1/2 IN (Dressing) ×2 IMPLANT
STRIP SKIN CLOSURE L4 IN X W1/2 IN REINFORCE STERI-STRIP POLYESTER (Dressing) ×2 IMPLANT
STRIP SKIN CLOSURE L5 IN X W1 IN (Dressing) ×2 IMPLANT
STRIP SKIN CLOSURE L5 IN X W1 IN REINFORCE STERI-STRIP POLYESTER WHITE (Dressing) ×2 IMPLANT
STRIP SKNCLS PLSTR STRSTRP 4X.5IN LF (Dressing) ×2
STRIP SKNCLS PLSTR STRSTRP 5X1IN LF STRL (Dressing) ×2
SUTURE ABS 0 OS-6 VCL 18IN CR BRD 3 STRN (Suture) ×2
SUTURE ABS 2-0 CP-2 VCL 18IN CR BRD 8 (Suture) ×2
SUTURE ABS 2-0 CT1 VCL 18IN CR BRD 8 (Suture) ×2
SUTURE ABS 4-0 PS2 MNCRL MTPS 18IN MFL (Suture) ×2
SUTURE COATED VICRYL 0 OS-6 L18 IN (Suture) ×2 IMPLANT
SUTURE COATED VICRYL 0 OS-6 L18 IN CONTROL RELEASE BRAID 3 STRAND (Suture) ×2 IMPLANT
SUTURE COATED VICRYL 2-0 CP-2 L18 IN (Suture) ×2 IMPLANT
SUTURE COATED VICRYL 2-0 CT-1 18IN CNTRL BRD 8 STRND UNDYED (Suture) ×2 IMPLANT
SUTURE COATED VICRYL 2-0 CT-1 L18 IN (Suture) ×2 IMPLANT
SUTURE MONOCRYL 4-0 PS-2 L18 IN (Suture) ×2 IMPLANT
SUTURE MONOCRYL 4-0 PS-2 L18 IN MONOFILAMENT UNDYED ABSORBABLE (Suture) ×2 IMPLANT
SYRINGE 10 ML LUER LOCK MEDLINE MEDICAL (Needles) ×8 IMPLANT
SYRINGE 20 ML LUER LOCK MEDLINE MEDICAL (Syringes, Needles) ×2 IMPLANT
SYRINGE MED 10ML LF STRL LL (Needles) ×8
SYRINGE MED 20ML LF STRL LL (Syringes, Needles) ×2
SYSTEM WND IRR 0.05% CHG IRRISEPT LF (Solution) ×2
SYSTEM WOUND IRRIGATION DEBRIDEMENT (Solution) ×2 IMPLANT
SYSTEM WOUND IRRIGATION DEBRIDEMENT CLEANSING IRRISEPT 0.05% (Solution) ×2 IMPLANT
TAPE SRG CLTH DPRE 10YDX3IN LF HPOAL WTR (Tape) ×6
TAPE SURGICAL L10 YD X W3 IN (Tape) ×6 IMPLANT
TAPE SURGICAL L10 YD X W3 IN HYPOALLERGENIC WATER RESISTANT (Tape) ×6 IMPLANT
TBNG CON SCTN STRL .1875X10 (Tubing) ×6
TOOL DISSECTING L10 CM MATCH HEAD OD1.7 (Burr) IMPLANT
TOOL DISSECTING L10 CM MATCH HEAD OD1.7 MM LEGEND BURR (Burr) IMPLANT
TOOL DISSECTING L14 CM MATCH HEAD FLUTE (Burr) ×2 IMPLANT
TOOL DISSECTING L14 CM MATCH HEAD FLUTE OD3 MM MIDAS REX LEGEND (Burr) ×2 IMPLANT
TOOL DSCT LGND 1.7MM 10CM STRL MTCH HD (Burr)
TOOL DSCT LGND 3MM 14MM (Burr) ×2
TOWEL L27 IN X W17 IN COTTON PREWASH (Other) ×2 IMPLANT
TOWEL L27 IN X W17 IN COTTON PREWASH DELINT HIGH ABSORBENT BLUE (Other) ×2 IMPLANT
TOWEL SRG CTTN 27X17IN LF STRL PREWASH (Other) ×2
TRAY SRG SPINE ~~LOC~~ (Pack) ×2 IMPLANT
TRAY SURGICAL SPINE (Pack) ×2 IMPLANT
TUBING SUCTION ID3/16 IN L10 FT (Tubing) ×6 IMPLANT
TUBING SUCTION ID3/16 IN L10 FT NONCONDUCTIVE FML CONNECTOR MEDLN (Tubing) ×6 IMPLANT
WIRE FIXATION TIMBERLINE SPINE CHAMFER (Ortho Supply) ×4 IMPLANT
WIRE FIXATION TIMBERLINE SPINE CHAMFER LATERAL FUSION SYSTEM (Ortho Supply) ×4 IMPLANT
WIRE FX TIMBERLINE CHMFR SPNE LAT FSN (Ortho Supply) ×4

## 2022-05-24 NOTE — Progress Notes (Signed)
NEUROSURGERY PROGRESS NOTE    Date Time: 05/24/22 9:57 PM  Patient Name: Stephanie Castaneda  Consulting Attending Physician: Dr. Deloria Lair  Covered By: Team A: [phone]    Assessment:   SHALISE ROSADO is a 81 y.o. female with PMH cervical fusion presenting w/ lumbar spondylosis, BLE distal weakness, now POD0 s/p L2-3 XLIF and PSF with decompression.      Plan:   - Vital signs per unit routine  - q4h neurochecks  - Plan for EKG and troponins now  - Imaging:  - No further imaging unless neurologic decline  - Activity/Positioning:  - Ad lib  - Out of bed to chair tonight  - Walking tonight  - Encourage IS  - Diet:   - Advance diet as tolerated to regular diet  - Medications:  - Ancef x24h  - Multimodal pain control with QD bowel regimen.  - Resume home medications exceptASA 81  - No therapeutic anticoagulants/antiplatelets/NSAIDs    - May resume ASA in 7 days  - Consults:  - PT/OT eval and treat  - TLSO brace placement; ok to don on/off at edge of bed; does not need it tonight to ambulate  - DVT Prophylaxis:  - SCDs  - Chemo DVT PPx: hold in the setting of recent surgery  - Wound Care:  - Dressing down 9/17  - Suture/Staple removal by NSGY team in 2 weeks  - Disposition:   - Floor    Plan discussed and formulated with Dr. Deloria Lair.    Interim History:   OR Date 05/24/22: OR for L2-3 XLIF and PSF    24h Events:   - Patient endorses mild chest pain post op that is not reproducible to palpation and does not radiate. It has been improving over the past 10 mins. She denies radiculopathy.    Medications:     Current Facility-Administered Medications   Medication Dose Route Frequency    [START ON 05/25/2022] calcitonin (salmon)  1 spray Alternating Nares Daily    carboxymethylcellulose (PF)  1 drop Both Eyes BID    [START ON 05/25/2022] levothyroxine  88 mcg Oral Daily    [START ON 05/25/2022] losartan  12.5 mg Oral Daily    [START ON 05/25/2022] metoprolol succinate XL  12.5 mg Oral Daily    metoprolol succinate XL  25 mg Oral Daily     metoprolol succinate XL  25 mg Oral Daily    [START ON 05/25/2022] rosuvastatin  10 mg Oral Daily    [START ON 05/25/2022] solifenacin  10 mg Oral Daily    [START ON 05/25/2022] spironolactone  12.5 mg Oral Daily    [START ON 05/25/2022] vitamin B-12  1,000 mcg Oral Daily       Physical Exam:     Vitals:    05/24/22 2110   BP: 156/72   Pulse: 69   Resp: 16   Temp:    SpO2: 96%       Intake and Output Summary (Last 24 hours) at Date Time    Intake/Output Summary (Last 24 hours) at 05/24/2022 2157  Last data filed at 05/24/2022 2047  Gross per 24 hour   Intake 2550 ml   Output 950 ml   Net 1600 ml         General Appearance: Mildly uncomfortable  HEENT: atraumatic, normocephalic. Trachea midline.  CV: Hemodynamically stable, appears well-perfused  Pulm: Equal chest rise, normal effort, no respiratory distress  GI: soft, NTND  Ext: No edema, distal pulses present  Skin: No wounds  Incision: clean dry and intact, dressing in place x2    NEURO:  A&O x3  PERRLA 3mm                        Language: intact    CN II-XII grossly intact         Motor:  Normal bulk and tone throughout. No abnormal movements.    LE  Hip F  (L2) Knee E (L3) DF (L4)  EHL (L5) PF  (S1)    R  4/5 5/5 4/5 4/5 5/5   L 4/5 5/5 3/5 3/5 4/5     Sensory: Sensation intact to LT throughout. No spinal level.       Labs:     Lab Results   Component Value Date    WBC 5.98 05/06/2022    HGB 13.6 05/06/2022    HCT 40.3 05/06/2022    MCV 94.6 05/06/2022    PLT 254 05/06/2022     No results found for: "NA", "K", "CL", "CO2"  Lab Results   Component Value Date    INR 0.9 05/06/2022    PT 10.7 05/06/2022       Rads:     Radiology Results (24 Hour)       Procedure Component Value Units Date/Time    Fluoroscopy less than 1 hour [[id number]] Collected: 05/24/22 2122    Order Status: Completed Updated: 05/24/22 2125    Narrative:      HISTORY: intraoperative imaging.    COMPARISON: None.    TECHNIQUE: Fluoroscopic guidance was provided for this operative  procedure,  performed without the presence of a radiologist.    Fluoroscopy Time: 101.8 seconds.  Cumulative Air Kerma: 90.75 mGy.    FINDINGS:  L2-3, L3-4 fusion Please refer to the procedure report for additional  details.       Impression:         Fluoroscopic guidance provided without the presence of a radiologist.    [name]  05/24/2022 9:23 PM            Signed by: Clois Dupes, MD  Neurosurgery  PGY-4  05/24/22 9:57 PM

## 2022-05-24 NOTE — Brief Op Note (Signed)
BRIEF OP NOTE    Date Time: 05/24/22 9:13 PM    Patient Name:   Stephanie Castaneda, Stephanie Castaneda (MRN: [id number])    Date of Operation:   05/24/2022     Providers Performing:   Surgeon(s) and Role:     Marlaine Hind, MD - Primary    Surgical First Assistant(s):   First Assistant: Kathreen Cosier; Wynelle Fanny, SA    Operative Procedure:   L2-L3 XLIF, L2-L3 POSTERIOR DECOMPRESSION AND FUSION: 22558 (CPT)  LAMINECTOMY,  L3-4 POSTERIOR LUMBAR, DECOMP, INST, FUSION LEVEL1:     Preoperative Diagnosis:   Pre-Op Diagnosis Codes:     * Lumbar radiculopathy [M54.16]    Postoperative Diagnosis:   Post-Op Diagnosis Codes:     * Lumbar radiculopathy [M54.16]    Findings:       Anesthesia:   General    Estimated Blood Loss:    50 mL    Implants:     Implant Name Type Inv. Item Serial No. Model No. Manufacturer Lot No. LRB No. Used Action   GRAFT INFUSE BN KT XX SM - [id number] Graft GRAFT INFUSE BN KT XX SM  [id number] [employer] [id number] Left 1 Implanted   GRAFT BN 5CC PRIMAGEN ALGRF ADV - [id number] Allograft GRAFT BN 5CC PRIMAGEN ALGRF ADV [id number] [employer] [id number] Left 1 Implanted   EXTENDER BNGF HYALURONIC ACD POLY - [id number] Graft EXTENDER BNGF HYALURONIC ACD POLY  [id number] [employer] [id number] N/A 2 Implanted   GRAFT BN 10CC INFLUX SPARC ALGRF DMSO FR - [id number] Graft GRAFT BN 10CC INFLUX SPARC ALGRF DMSO FR [id number] [employer] [id number] N/A 1 Implanted   FILLER BN VOID CA SLF 10CC STIMULAN - [id number] Cement FILLER BN VOID CA SLF 10CC STIMULAN  [id number] [employer] [id number] N/A 1 Implanted   CAGE TIMBERLINE MPF 45X18X8MM 0D - SN/A Cage CAGE TIMBERLINE MPF 45X18X8MM 0D N/A [id number] [employer] N/A N/A 1 Implanted   PLATE TIMBERLINE MPF 2H SZ8 - SN/A Plate PLATE TIMBERLINE MPF 2H SZ8 N/A [id number] [employer] N/A N/A 1 Implanted   END CAP SPNL TI TIMBERLINE 8-14MM 2 HL - SN/A Cover END CAP SPNL TI TIMBERLINE 8-14MM 2 HL N/A [id number] [employer] N/A N/A 1  Implanted   SCREW TIMBERLINE VAR 5.5X30MM - SN/A Screw SCREW TIMBERLINE VAR 5.5X30MM N/A [id number] [employer] N/A N/A 2 Implanted   SCREW BN VTL  NS PA SPNE - SN/A Screw SCREW BN VTL  NS PA SPNE N/A [id number] [employer] N/A N/A 4 Implanted   SCREW SPNL TI STD VTL TRQ LMT SPNE - SN/A Screw SCREW SPNL TI STD VTL TRQ LMT SPNE N/A [id number] [employer] N/A N/A 4 Implanted   ROD SPNL TI CRV VTL 5.5MM LG HEX - SN/A Rods ROD SPNL TI CRV VTL 5.5MM LG HEX N/A [id number] [employer] N/A N/A 2 Implanted           Drains:   Drains: no            Specimens:   * No specimens in log *       Complications:   None     Signed by: Marlaine Hind, MD  [facility]

## 2022-05-24 NOTE — Transfer of Care (Signed)
Anesthesia Transfer of Care Note    Patient: Stephanie Castaneda    Procedures performed: Procedure(s):  L2-L3 XLIF, L2-L3 POSTERIOR DECOMPRESSION AND FUSION  LAMINECTOMY,  L3-4 POSTERIOR LUMBAR, DECOMP, INST, FUSION LEVEL1    Anesthesia type: General ETT    Patient location:PACU    Last vitals:   Vitals:    05/24/22 2045   BP: 156/68   Pulse: 81   Resp: 14   Temp: 36.3 C (97.3 F)   SpO2: 100%       Post pain: Patient not complaining of pain, continue current therapy      Mental Status:awake and alert     Respiratory Function: tolerating face mask    Cardiovascular: stable    Nausea/Vomiting: patient not complaining of nausea or vomiting    Hydration Status: adequate    Post assessment: no apparent anesthetic complications, no reportable events and no evidence of recall    Signed by: Rowland Lathe, CRNA  05/24/22 8:46 PM

## 2022-05-24 NOTE — Anesthesia Postprocedure Evaluation (Signed)
Anesthesia Post Evaluation    Patient: Stephanie Castaneda    Procedure(s):  L2-L3 XLIF, L2-L3 POSTERIOR DECOMPRESSION AND FUSION  LAMINECTOMY,  L3-4 POSTERIOR LUMBAR, DECOMP, INST, FUSION LEVEL1    Anesthesia type: general    Last Vitals:   Vitals Value Taken Time   BP 156/72 05/24/22 2110   Temp 36.3 C (97.3 F) 05/24/22 2045   Pulse 69 05/24/22 2110   Resp 16 05/24/22 2110   SpO2 96 % 05/24/22 2110                 Anesthesia Post Evaluation:     Patient Evaluated: bedside  Patient Participation: complete - patient participated  Level of Consciousness: awake and alert    Pain Management: adequate    Airway Patency: patent        Anesthetic complications: No      PONV Status: controlled    Cardiovascular status: hemodynamically stable (chest pain)  Respiratory status: acceptable  Hydration status: acceptable          Signed by: Duard Larsen, MD, 05/24/2022 9:43 PM

## 2022-05-24 NOTE — Anesthesia Preprocedure Evaluation (Signed)
Anesthesia Evaluation    AIRWAY    Mallampati: I    TM distance: >3 FB  Neck ROM: full  Mouth Opening:full   CARDIOVASCULAR    regular       DENTAL    no notable dental hx               PULMONARY    clear to auscultation     OTHER FINDINGS                                      Relevant Problems   ANESTHESIA   (+) PONV (postoperative nausea and vomiting)      CARDIO   (+) Coronary artery disease   (+) Hypertension   (+) Mild mitral regurgitation   (+) Mild mitral valve prolapse      ENDO   (+) Hypothyroidism       Hx n/v with narcotics    METS > 4          Current Facility-Administered Medications:   .  acetaminophen (TYLENOL) tablet 1,000 mg, 1,000 mg, Oral, Once, Rondel Oh, NP  .  ceFAZolin sodium (ANCEF) 2 g in sterile water (preservative free) 10 mL IV push injection, 2 g, Intravenous, Pre-Op, Rondel Oh, NP  .  celecoxib (CeleBREX) capsule 200 mg, 200 mg, Oral, Once, Rondel Oh, NP  .  gabapentin (NEURONTIN) capsule 300 mg, 300 mg, Oral, Once, Rondel Oh, NP  .  lactated ringers infusion, , Intravenous, Continuous, Thresa Ross, DNP NP  .  lidocaine (LMX) 4 % cream, , Topical, Once PRN, Thresa Ross, DNP NP  .  lidocaine (XYLOCAINE) 1 % injection 1 mL, 1 mL, Intradermal, Once PRN, Thresa Ross, DNP NP      Last VS    97%  165/64  86         No results found for: "CREAT", "BUN", "NA", "K", "CL", "CO2"  Lab Results   Component Value Date    WBC 5.98 05/06/2022     Lab Results   Component Value Date    PLT 254 05/06/2022     Lab Results   Component Value Date    HCT 40.3 05/06/2022                         Anesthesia Plan    ASA 3     general                     Detailed anesthesia plan: general endotracheal        Post op pain management: per surgeon and other    informed consent obtained    Plan discussed with CRNA.    ECG reviewed  pertinent labs reviewed             Signed by: Jenne Campus, MD 05/24/22 1:21 PM

## 2022-05-24 NOTE — Consults (Signed)
[facility]ICINE CONSULT    Date Time: 05/25/22 6:54 AM  Patient Name: Stephanie Castaneda,Stephanie Castaneda  Attending Physician: Gae DryGray, Brigid C, MD  Primary Care Physician: Pcp, None, MD    CC: chest pain after surgery      History of Presenting Illness:   Stephanie Castaneda is a 81 y.o. female hx hypothyroid, HLD, [place] 5/1-01/10/22 for NSTEMI + Takotsubo cardiomyopathy (heart catheterization showed patent coronary arteries - initial EF was 25-30% but improved to 60-65% on 03/18/22) who underwent L2-3 fusion with Dr. Deloria LairHamilton. In the PACU she had chest pain. By the time I saw her it had resolved. She denies shortness of breath or diaphoresis. These symptoms are sudden onset, moderate intensity, without alleviating factors.    Past Medical History:     Past Medical History:   Diagnosis Date    Cervical stenosis of spinal canal     pre-op dx    Hypercholesterolemia     Hypothyroidism        Past Surgical History:     Past Surgical History:   Procedure Laterality Date    APPENDECTOMY (OPEN)  1962    CESAREAN SECTION  1988    COLONOSCOPY  2013    normal    DISCECTOMY, ANTERIOR CERVICAL, FUSION, LEVELS 2 N/A 09/21/2020    Procedure: C5-C7 ACDF;  Surgeon: Marlaine HindHamilton, John F, MD;  [facility];  Service: Neurosurgery;  Laterality: N/A;  C5-C7 ACDF    HYSTERECTOMY, ABDOMINAL, TOTAL  2005    LAMINECTOMY, ANTERIOR LUMBAR DECOMPRESSION, LEVEL 1  1971    PELVIC FLOOR REPAIR  2005       Family History:     Family History   Problem Relation Age of Onset    Stroke Mother     Heart attack Father        Social History:     Social History     Socioeconomic History    Marital status: Married     Spouse name: Not on file    Number of children: Not on file    Years of education: Not on file    Highest education level: Not on file   Occupational History    Not on file   Tobacco Use    Smoking status: Never    Smokeless tobacco: Never   Vaping Use    Vaping Use: Never used   Substance and Sexual Activity    Alcohol  use: Not Currently    Drug use: Not Currently    Sexual activity: Not Currently   Other Topics Concern    Not on file   Social History Narrative    Not on file     Social Determinants of Health     Financial Resource Strain: Low Risk  (04/08/2021)    Overall Financial Resource Strain (CARDIA)     Difficulty of Paying Living Expenses: Not very hard   Food Insecurity: No Food Insecurity (04/08/2021)    Hunger Vital Sign     Worried About Running Out of Food in the Last Year: Never true     Ran Out of Food in the Last Year: Never true   Transportation Needs: No Transportation Needs (04/08/2021)    PRAPARE - Therapist, artTransportation     Lack of Transportation (Medical): No     Lack of Transportation (Non-Medical): No   Physical Activity: Sufficiently Active (04/08/2021)    Exercise Vital Sign     Days of Exercise per Week: 6 days  Minutes of Exercise per Session: 50 min   Stress: No Stress Concern Present (04/08/2021)    [employer] of Occupational Health - Occupational Stress Questionnaire     Feeling of Stress : Only a little   Social Connections: Socially Integrated (04/08/2021)    Social Connection and Isolation Panel [NHANES]     Frequency of Communication with Friends and Family: More than three times a week     Frequency of Social Gatherings with Friends and Family: Once a week     Attends Religious Services: More than 4 times per year     Active Member of [employer] or Organizations: Yes     Attends Engineer, structural: More than 4 times per year     Marital Status: Married   Catering manager Violence: Not At Risk (04/08/2021)    Humiliation, Afraid, Rape, and Kick questionnaire     Fear of Current or Ex-Partner: No     Emotionally Abused: No     Physically Abused: No     Sexually Abused: No   Housing Stability: Low Risk  (04/08/2021)    Housing Stability Vital Sign     Unable to Pay for Housing in the Last Year: No     Number of Places Lived in the Last Year: 1     Unstable Housing in the Last Year: No        Allergies:     Allergies   Allergen Reactions    Oxycodone Nausea And Vomiting    Hydrocodone Nausea And Vomiting       Medications:     Prior to Admission medications    Medication Sig Start Date End Date Taking? Authorizing Provider   Acetaminophen (TYLENOL PO) Take by mouth   Yes [provider]   ASPIRIN 81 PO Take by mouth   Yes [provider]   Carboxymethylcellulose Sodium (REFRESH TEARS OP) Apply to eye   Yes [provider]   losartan (COZAAR) 25 MG tablet Take 0.5 tablets (12.5 mg) by mouth daily 01/10/22  Yes [provider]   metoprolol succinate XL (TOPROL-XL) 25 MG 24 hr tablet Take 0.5 tablets (12.5 mg) by mouth daily 01/10/22  Yes [provider]   rosuvastatin (CRESTOR) 10 MG tablet Take 1 tablet (10 mg) by mouth daily 04/13/20  Yes [provider]   solifenacin (VESICARE) 10 MG tablet Take 1 tablet (10 mg) by mouth daily 04/13/20  Yes [provider]   spironolactone (ALDACTONE) 25 MG tablet Take 0.5 tablets (12.5 mg) by mouth daily 01/10/22  Yes [provider]   Synthroid 88 MCG tablet Take 1 tablet (88 mcg) by mouth daily 10/17/20  Yes [provider]   calcitonin, salmon, (MIACALCIN) 200 UNIT/ACT nasal spray 1 spray by Nasal route daily 05/19/22   Rondel Oh, NP   cyanocobalamin 1000 MCG tablet Take 1 tablet (1,000 mcg) by mouth daily    [provider]   Misc. Devices Kit Bone growth stimulator 05/19/22   Rondel Oh, NP       Review of Systems:   All other systems were reviewed and are negative except: as above.    Physical Exam:     Vitals:    05/25/22 0520   BP:    Pulse:    Resp:    Temp:    SpO2: 93%       Intake and Output Summary (Last 24 hours) at Date Time    Intake/Output Summary (Last 24  hours) at 05/25/2022 0654  Last data filed at 05/25/2022 0500  Gross per 24 hour   Intake 2550 ml   Output 1100 ml   Net 1450 ml       General: awake, alert, oriented x 3; no acute distress.  HEENT: perrla, eomi,  sclera anicteric  oropharynx clear without lesions, mucous membranes moist  Neck: supple, no lymphadenopathy, no thyromegaly, no JVD, no carotid bruits  Cardiovascular: regular rate and rhythm, no murmurs, rubs or gallops  Lungs: clear to auscultation bilaterally, without wheezing, rhonchi, or rales  Neuro: cranial nerves grossly intact, strength 5/5 in upper and lower extremities, sensation intact, follows commands  Skin: no rashes or lesions noted      Labs:     Results       Procedure Component Value Units Date/Time    High Sensitivity Troponin-I at 2 hrs with calculated Delta [[id number]] Collected: 05/25/22 0208    Specimen: Blood Updated: 05/25/22 0245     hs Troponin-I <2.7 ng/L      hs Troponin-I Delta Unable toCalc. ng/L     High Sensitivity Troponin-I at 0 hrs [[id number]] Collected: 05/24/22 2213    Specimen: Blood Updated: 05/24/22 2255     hs Troponin-I 8.1 ng/L     RBC O.R. HOLD Blood Product, 2 Units [[id number]] Collected: 05/24/22 1323     Updated: 05/24/22 1455    Narrative:      Pre-Surgical/Pre-Procedure->Yes  Has the patient been transfused w/i the last 3  months?->Unknown  Has patient been pregnant within past 3 months?->No  For transfusion?->Yes  Special Requirements->No special requirements  Surgery/ Procedure (to be Performed)->L2-3 posterior lumbar  fusion; L2-3 XLIF  Surgery/Procedure Date:->05/24/22  Has patient been pregnant within past 3 months?->No  Has the patient been transfused w/i the last 3  months?->Unknown    Type and Screen [[id number]] Collected: 05/24/22 1323    Specimen: Blood Updated: 05/24/22 1436     ABO Rh A POS     AB Screen Gel NEG    Narrative:      Pre-Surgical/Pre-Procedure->Yes  Has the patient been transfused w/i the last 3  months?->Unknown  Has patient been pregnant within past 3 months?->No  For transfusion?->Yes    Blood Type Confirmation [[id number]] Collected: 05/24/22 1335    Specimen: Blood Updated: 05/24/22 1413     Blood Type Confirmation A POS    2nd ABORH  Request [[id number]] Collected: 05/24/22 1323     Updated: 05/24/22 1336     2nd ABORH Request FT    Narrative:      Pre-Surgical/Pre-Procedure->Yes  Has the patient been transfused w/i the last 3  months?->Unknown  Has patient been pregnant within past 3 months?->No  For transfusion?->Yes            Radiology Results (24 Hour)       Procedure Component Value Units Date/Time    Fluoroscopy less than 1 hour [[id number]] Collected: 05/24/22 2122    Order Status: Completed Updated: 05/24/22 2125    Narrative:      HISTORY: intraoperative imaging.    COMPARISON: None.    TECHNIQUE: Fluoroscopic guidance was provided for this operative procedure,  performed without the presence of a radiologist.    Fluoroscopy Time: 101.8 seconds.  Cumulative Air Kerma: 90.75 mGy.    FINDINGS:  L2-3, L3-4 fusion Please refer to the procedure report for additional  details.       Impression:         Fluoroscopic guidance provided without  the presence of a radiologist.    [name]  05/24/2022 9:23 PM              Assessment:     Patient Active Problem List   Diagnosis    Cervical stenosis of spinal canal    History of back surgery    Bilateral foot-drop    Numbness in both legs    Lumbar radiculopathy    Narcotic-induced nausea and vomiting    Takotsubo cardiomyopathy    Hypertension    History of seizure    Hypothyroidism    Coronary artery disease    Left atrial dilation    Myxomatous mitral valve    Mild mitral valve prolapse    Mild mitral regurgitation    Mild tricuspid regurgitation    PONV (postoperative nausea and vomiting)    Lumbar stenosis with neurogenic claudication       81 y.o. female hx hypothyroid, HLD, [place] admit 5/1-01/10/22 for NSTEMI + Takotsubo cardiomyopathy (heart catheterization showed patent coronary arteries - initial EF was 25-30% but improved to 60-65% on 03/18/22) who underwent L2-3 fusion with Dr. Deloria Lair.    Recommendations:   Chest pain - hs troponin was 8.1 -> 2.7. Neurosurgery contacted cardiology  (Dr. Youlanda Mighty?). Telemetry. Repeat EKG in AM.     Hx hypothyroid, HLD, [place] admit 5/1-01/10/22 for NSTEMI + Takotsubo cardiomyopathy (heart catheterization showed patent coronary arteries - initial EF was 25-30% but improved to 60-65% on 03/18/22) - Noted.     DVT/GI prophylaxis - SCDs.     Thank you for the consultation. We will follow the patient with you.    Signed by: Emeline Darling, MD, MD   cc:Pcp, None, MD

## 2022-05-25 DIAGNOSIS — M48062 Spinal stenosis, lumbar region with neurogenic claudication: Principal | ICD-10-CM

## 2022-05-25 DIAGNOSIS — R079 Chest pain, unspecified: Secondary | ICD-10-CM | POA: Diagnosis present

## 2022-05-25 LAB — HIGH SENSITIVITY TROPONIN-I WITH DELTA
hs Troponin-I Delta: UNDETERMINED ng/L
hs Troponin-I: <2.7 ng/L

## 2022-05-25 MED ORDER — STERILE WATER FOR INJECTION IJ/IV SOLN (WRAP)
2.0000 g | Freq: Three times a day (TID) | INTRAMUSCULAR | Status: AC
Start: 2022-05-25 — End: 2022-05-25
  Administered 2022-05-25 (×2): 2 g via INTRAVENOUS
  Filled 2022-05-25 (×2): qty 2000

## 2022-05-25 MED ORDER — MORPHINE SULFATE 4 MG/ML IJ/IV SOLN (WRAP)
2.0000 mg | Status: DC | PRN
Start: 2022-05-25 — End: 2022-05-26

## 2022-05-25 MED ORDER — CARBOXYMETHYLCELLULOSE SOD PF 0.5 % OP SOLN
1.0000 [drp] | Freq: Four times a day (QID) | OPHTHALMIC | Status: DC
Start: 2022-05-26 — End: 2022-05-26
  Administered 2022-05-25 – 2022-05-26 (×3): 1 [drp] via OPHTHALMIC
  Filled 2022-05-25: qty 1

## 2022-05-25 MED ORDER — ACETAMINOPHEN 325 MG PO TABS
650.0000 mg | ORAL_TABLET | Freq: Four times a day (QID) | ORAL | Status: DC
Start: 2022-05-25 — End: 2022-05-26
  Administered 2022-05-25 – 2022-05-26 (×6): 650 mg via ORAL
  Filled 2022-05-25 (×6): qty 2

## 2022-05-25 MED ORDER — PROMETHAZINE HCL 25 MG RE SUPP
25.0000 mg | Freq: Four times a day (QID) | RECTAL | Status: DC | PRN
Start: 2022-05-25 — End: 2022-05-26

## 2022-05-25 MED ORDER — ONDANSETRON HCL 4 MG/2ML IJ SOLN
4.0000 mg | Freq: Three times a day (TID) | INTRAMUSCULAR | Status: DC | PRN
Start: 2022-05-25 — End: 2022-05-26
  Administered 2022-05-25: 4 mg via INTRAVENOUS
  Filled 2022-05-25: qty 2

## 2022-05-25 MED ORDER — CYCLOBENZAPRINE HCL 10 MG PO TABS
5.0000 mg | ORAL_TABLET | Freq: Three times a day (TID) | ORAL | Status: DC | PRN
Start: 2022-05-25 — End: 2022-05-26
  Administered 2022-05-25: 5 mg via ORAL
  Filled 2022-05-25: qty 1

## 2022-05-25 MED ORDER — OXYCODONE HCL 5 MG PO TABS
5.0000 mg | ORAL_TABLET | ORAL | Status: DC | PRN
Start: 2022-05-25 — End: 2022-05-25
  Filled 2022-05-25: qty 1

## 2022-05-25 MED ORDER — HYDROMORPHONE HCL 2 MG PO TABS
2.0000 mg | ORAL_TABLET | ORAL | Status: DC | PRN
Start: 2022-05-25 — End: 2022-05-26

## 2022-05-25 MED ORDER — MAGNESIUM HYDROXIDE 400 MG/5ML PO SUSP
10.0000 mL | ORAL | Status: DC | PRN
Start: 2022-05-25 — End: 2022-05-26
  Administered 2022-05-26: 10 mL via ORAL
  Filled 2022-05-25: qty 30

## 2022-05-25 MED ORDER — OXYCODONE HCL 5 MG PO TABS
10.0000 mg | ORAL_TABLET | ORAL | Status: DC | PRN
Start: 2022-05-25 — End: 2022-05-25

## 2022-05-25 MED ORDER — FLEET ENEMA 7-19 GM/118ML RE ENEM
1.0000 | ENEMA | Freq: Once | RECTAL | Status: DC | PRN
Start: 2022-05-25 — End: 2022-05-26

## 2022-05-25 MED ORDER — SODIUM CHLORIDE (PF) 0.9 % IJ SOLN
3.0000 mL | Freq: Three times a day (TID) | INTRAMUSCULAR | Status: DC
Start: 2022-05-25 — End: 2022-05-26
  Administered 2022-05-25 – 2022-05-26 (×4): 3 mL via INTRAVENOUS

## 2022-05-25 MED ORDER — BENZOCAINE-MENTHOL MT LOZG (WRAP)
1.0000 | LOZENGE | OROMUCOSAL | Status: DC | PRN
Start: 2022-05-25 — End: 2022-05-26

## 2022-05-25 MED ORDER — BISACODYL 10 MG RE SUPP
10.0000 mg | Freq: Every day | RECTAL | Status: DC | PRN
Start: 2022-05-25 — End: 2022-05-26
  Administered 2022-05-26: 10 mg via RECTAL
  Filled 2022-05-25 (×3): qty 1

## 2022-05-25 MED ORDER — CYCLOBENZAPRINE HCL 5 MG PO TABS
5.0000 mg | ORAL_TABLET | Freq: Three times a day (TID) | ORAL | 0 refills | Status: AC | PRN
Start: 2022-05-25 — End: 2022-06-22

## 2022-05-25 MED ORDER — NALOXONE HCL 0.4 MG/ML IJ SOLN (WRAP)
0.4000 mg | INTRAMUSCULAR | Status: DC | PRN
Start: 2022-05-25 — End: 2022-05-26

## 2022-05-25 MED ORDER — GABAPENTIN 100 MG PO CAPS
100.0000 mg | ORAL_CAPSULE | Freq: Three times a day (TID) | ORAL | Status: DC
Start: 2022-05-25 — End: 2022-05-26
  Administered 2022-05-25 – 2022-05-26 (×3): 100 mg via ORAL
  Filled 2022-05-25 (×4): qty 1

## 2022-05-25 MED ORDER — SENNOSIDES-DOCUSATE SODIUM 8.6-50 MG PO TABS
1.0000 | ORAL_TABLET | Freq: Two times a day (BID) | ORAL | Status: DC
Start: 2022-05-25 — End: 2022-05-26
  Administered 2022-05-25 – 2022-05-26 (×3): 1 via ORAL
  Filled 2022-05-25 (×3): qty 1

## 2022-05-25 MED ORDER — MAGNESIUM HYDROXIDE 400 MG/5ML PO SUSP
10.0000 mL | ORAL | 0 refills | Status: AC | PRN
Start: 2022-05-25 — End: ?

## 2022-05-25 MED ORDER — DIPHENHYDRAMINE HCL 25 MG PO CAPS
25.0000 mg | ORAL_CAPSULE | Freq: Four times a day (QID) | ORAL | Status: DC | PRN
Start: 2022-05-25 — End: 2022-05-26

## 2022-05-25 MED ORDER — TRAMADOL HCL 50 MG PO TABS
50.0000 mg | ORAL_TABLET | Freq: Four times a day (QID) | ORAL | 0 refills | Status: AC | PRN
Start: 2022-05-25 — End: 2022-06-01

## 2022-05-25 MED ORDER — SODIUM CHLORIDE 0.9 % IV SOLN
6.2500 mg | Freq: Four times a day (QID) | INTRAVENOUS | Status: DC | PRN
Start: 2022-05-25 — End: 2022-05-26

## 2022-05-25 MED ORDER — SODIUM CHLORIDE 0.9 % IV SOLN
INTRAVENOUS | Status: DC
Start: 2022-05-25 — End: 2022-05-26

## 2022-05-25 MED ORDER — ONDANSETRON 4 MG PO TBDP
4.0000 mg | ORAL_TABLET | Freq: Three times a day (TID) | ORAL | Status: DC | PRN
Start: 2022-05-25 — End: 2022-05-26

## 2022-05-25 MED ORDER — PROMETHAZINE HCL 25 MG PO TABS
25.0000 mg | ORAL_TABLET | Freq: Four times a day (QID) | ORAL | Status: DC | PRN
Start: 2022-05-25 — End: 2022-05-26

## 2022-05-25 MED ORDER — CARBOXYMETHYLCELLULOSE SOD PF 0.5 % OP SOLN
1.0000 [drp] | Freq: Two times a day (BID) | OPHTHALMIC | Status: DC
Start: 2022-05-25 — End: 2022-05-25
  Administered 2022-05-25: 1 [drp] via OPHTHALMIC
  Filled 2022-05-25 (×2): qty 1

## 2022-05-25 MED ORDER — TRAMADOL HCL 50 MG PO TABS
50.0000 mg | ORAL_TABLET | Freq: Four times a day (QID) | ORAL | Status: DC | PRN
Start: 2022-05-25 — End: 2022-05-26
  Administered 2022-05-25: 50 mg via ORAL
  Filled 2022-05-25: qty 1

## 2022-05-25 MED ORDER — GABAPENTIN 100 MG PO CAPS
100.0000 mg | ORAL_CAPSULE | Freq: Three times a day (TID) | ORAL | 0 refills | Status: DC
Start: 2022-05-25 — End: 2022-06-20

## 2022-05-25 NOTE — Progress Notes (Signed)
NEUROSURGERY PROGRESS NOTE    Date Time: 05/25/22 9:56 AM  Patient Name: Stephanie Castaneda,Saleemah M  Consulting Attending Physician: Dr. Deloria LairHamilton  Covered By: Team A: [phone]    Assessment:   Stephanie GratesCaryol M Mumme is a 81 y.o. female with PMH cervical fusion presenting w/ lumbar spondylosis, BLE distal weakness, now POD 0 (9/15) s/p L2-3 XLIF and PSF with decompression. LLE with improved strength and sensation postop.    Plan:   - Primary medical management per [facility].  - Vital signs per unit routine  - q4h neurochecks  - No further chest pain workup required unless symptoms recur  - Imaging:  - No further imaging unless neurologic decline  - Activity/Positioning:  - Ad lib  - TLSO brace when OOB   - PMP without brace okay   - May don/doff at edge of bed  - Out of bed to chair Qshift  - Ambulate TID with assist  - Encourage IS  - Diet:   - Advance diet as tolerated to regular diet  - Medications:  - Multimodal pain control with QD bowel regimen.  - Resume home medications exceptASA 81  - No therapeutic anticoagulants/antiplatelets/NSAIDs    - May resume ASA in 7 days  - Consults:  - PT/OT eval and treat  - DVT Prophylaxis:  - SCDs  - Chemo DVT PPx: May initiate POD 2 if remains hospitalized  - Wound Care:  - Dressing down 9/17 by neurosurgeyr team  - Suture/Staple removal by NSGY team in 2 weeks  - Disposition:   - Floor   - Anticipate discharge home pending PT/OT 9/17 if pain controlled.    Plan discussed and formulated with Dr. Deloria LairHamilton.    Interim History:   OR Date 05/24/22: OR for L2-3 XLIF and PSF    24h Events:   - Chest pain from overnight resolved. No additional events noted. Feels well this morning.  - AFVSS. Ambulated in room. Voiding spont. Tolerating applesauce. Pain appears controlled.  - Endorses increased LLE strength and improved sensation postop.    Medications:     Current Facility-Administered Medications   Medication Dose Route Frequency    acetaminophen  650 mg Oral 4 times per day    calcitonin  (salmon)  1 spray Alternating Nares Daily    artificial tears (REFRESH PLUS)  1 drop Both Eyes BID    ceFAZolin  2 g Intravenous Q8H    levothyroxine  88 mcg Oral Daily at 0600    losartan  12.5 mg Oral Daily    metoprolol succinate XL  12.5 mg Oral Daily    rosuvastatin  10 mg Oral Daily    senna-docusate  1 tablet Oral BID    sodium chloride (PF)  3 mL Intravenous Q8H    solifenacin  10 mg Oral Daily    spironolactone  12.5 mg Oral Daily    vitamin B-12  1,000 mcg Oral Daily     Physical Exam:     Vitals:    05/25/22 0838   BP: 127/66   Pulse: 96   Resp:    Temp:    SpO2:      Intake and Output Summary (Last 24 hours) at Date Time    Intake/Output Summary (Last 24 hours) at 05/25/2022 0956  Last data filed at 05/25/2022 0500  Gross per 24 hour   Intake 2550 ml   Output 1150 ml   Net 1400 ml       General Appearance:  NAD  HEENT: atraumatic, normocephalic. Trachea midline.  CV: Hemodynamically stable, appears well-perfused  Pulm: Equal chest rise, normal effort, no respiratory distress  GI: soft, NTND  Ext: No edema, distal pulses present  Skin: No wounds  Incision: clean dry and intact, dressing in place x2    NEURO:  A&O x3  PERRLA 65mm                        Language: intact    CN II-XII grossly intact         Motor:  Normal bulk and tone throughout. No abnormal movements.    LE  Hip F  (L2) Knee E (L3) DF (L4)  EHL (L5) PF  (S1)    R  5/5 5/5 5/5 5/5 5/5   L 5/5 5/5 4/5 4/5 4+/5     Sensory: Sensation intact to LT throughout. No spinal level.       Labs:     Lab Results   Component Value Date    WBC 5.98 05/06/2022    HGB 13.6 05/06/2022    HCT 40.3 05/06/2022    MCV 94.6 05/06/2022    PLT 254 05/06/2022     No results found for: "NA", "K", "CL", "CO2"  Lab Results   Component Value Date    INR 0.9 05/06/2022    PT 10.7 05/06/2022     Rads:     Radiology Results (24 Hour)       Procedure Component Value Units Date/Time    Fluoroscopy less than 1 hour [[id number]] Collected: 05/24/22 2122    Order Status: Completed  Updated: 05/24/22 2125    Narrative:      HISTORY: intraoperative imaging.    COMPARISON: None.    TECHNIQUE: Fluoroscopic guidance was provided for this operative procedure,  performed without the presence of a radiologist.    Fluoroscopy Time: 101.8 seconds.  Cumulative Air Kerma: 90.75 mGy.    FINDINGS:  L2-3, L3-4 fusion Please refer to the procedure report for additional  details.       Impression:         Fluoroscopic guidance provided without the presence of a radiologist.    [name]  05/24/2022 9:23 PM          Signed by: Rivka Spring, MD  Neurosurgery  PGY-6  05/25/22 9:56 AM

## 2022-05-25 NOTE — PT Progress Note (Signed)
Physical Therapy Treatment  Stephanie Castaneda  Post Acute Care Therapy Recommendations:     Discharge Recommendations:  Home with supervision, Home with home health PT    D/C Milestones: transfer, gait, and stair training. Anticipate achievement in 2-3 sessions    DME needs IF patient is discharging home: Front wheel walker    Therapy discharge recommendations may change with patient status.  Please refer to most recent note for up-to-date recommendations.     ASSESSMENT:     Significant Findings: none    Patient seated in a bedside chair upon arrival, amenable to physical therapy treatment session. Pt demonstrates good participation in PT and good progress towards goals. Pt progressed with ambulation distance, requiring CGA with the rolling walker for safety. Patient will continue to benefit from inpatient PT services to maximize functional mobility and independence.    Treatment Activities: transfers, gait, and patient education    Educated the patient to role of physical therapy, plan of care, goals of therapy and safety with mobility and ADLs, spine precautions. Pt able to recall 3/3 spinal precautions.     PLAN:   Treatment/Interventions: Investment banker, operational, Stair training, Functional transfer training, LE strengthening/ROM, Endurance training, Patient/family training, Equipment eval/education, Bed mobility    PT Frequency: 4-5x/wk    Continue plan of care.    Unit: [facility]  Bed: F831/F831.01       Precautions and Contraindications:   Falls  Spinal Precautions (no bending, lifting or twisting)  Brace Precautions: Back brace: TLSO and only when OOB    Updated Medical Status/Imaging/Labs: Reviewed, no barriers to therapy  Fluoroscopy less than 1 hour    Result Date: 05/24/2022   Fluoroscopic guidance provided without the presence of a radiologist. [name] 05/24/2022 9:23 PM     Lab Results   Component Value Date/Time    TROPI <2.7 05/25/2022 02:08 AM    TROPI Unable toCalc. 05/25/2022  02:08 AM    TROPI 8.1 05/24/2022 10:13 PM      SUBJECTIVE: "The brace feels comfortable."   Patient's medical condition is appropriate for Physical Therapy intervention at this time. Patient is agreeable to participation in the therapy session. Nursing clears patient for therapy.    Pt goal: to get better    Pain Assessment:  Numeric Scale (0-10): 3  Location: L side incision  Intervention: positioned for comfort     OBJECTIVE:   Observation of Patient:   Patient is seated in a bedside chair with telemetry and TLSO in place.  Patient wore mask during therapy session: No      Observation of Vital Signs:  did not complain of dizziness     Cognition/Neuro Status:  Behavior: calm, cooperative, attentive  Orientation Level: Person, Place , Time, and Situation    Functional Mobility:  PMP - Progressive Mobility Protocol   PMP Activity: Step 7 - Walks out of Room  Distance Walked (ft) (Step 6,7): 150 Feet    Bed Mobility:     NT; pt sitting up in chair    Transfers:  Sit to Stand: Contact-guard assistance   Stand to Sit: Contact-guard assistance   Device: Front wheel walker    Ambulation:  Distance: 150 Feet   Device: Front wheel walker  Assist Level: Contact-guard assistance  Pattern: decreased pace, decreased cadence    Balance:  Static sitting: good   Dynamic sitting: good   Static standing: good ; at rolling walker  Dynamic standing: fair  Patient Participation: good  Patient Endurance: good     Patient left seated in a bedside chair with call bell within reach, all needs met, and all questions answered.    SCD's off (as found)  Floor mat in place  Chair alarm in place    Goals:  Goals  Goal Formulation: With patient  Time for Goal Acheivement: 7 visits  Goals: Select goal  Pt Will Go Supine To Sit: modified independent  Pt Will Perform Sit To Supine: modified independent, with log roll  Pt Will Transfer Bed/Chair: with rolling walker, with supervision, Partly met  Pt Will Ambulate: 101-150 feet, with rolling  walker, with supervision, Partly met  Pt Will Go Up / Down Stairs: 1 flight, With SPC, with contact guard assist  Other Goal: Pt will go up/down 2 steps without rails with LRAD with CGA    PPE worn during session: gloves  Tech present: N/A  PPE worn by tech: N/A    Time of Treatment  PT Received On: 05/25/22  Start Time: 1630  Stop Time: 1655  Time Calculation (min): 25 min  Treatment # 1 out of 7 visits    Kallie Edward, PT, DPT  Pager [phone]

## 2022-05-25 NOTE — PT Eval Note (Addendum)
Physical Therapy Evaluation  Janautica Ewing Schlein      Post Acute Care Therapy Recommendations:     Discharge Recommendation: Home with supervision, Home with home health PT    D/C Milestones: increased ambulation distance and stair training. Anticipate achievement in 2-3 sessions    DME needs IF patient is discharging home: Front wheel walker    Therapy discharge recommendations may change with patient status. Please refer to most recent note for up-to-date recommendations.     ASSESSMENT:     Significant Findings: none    Sindy JAILYN LEESON is a 81 y.o. female admitted 05/24/2022.  Patient in bed upon arrival, amenable to physical therapy evaluation. Prior to admission, pt was independent with mobility using a walking stick and lived with her spouse. Pt reports that she plans to discharge to her daughter's home for 2 weeks and then to her son's home. Pt is currently SBA for bed mobility via log roll, CGA for transfers, and min assist for gait using a rolling walker. Pt is performing below functional baseline and will benefit from continued PT to progress independence and safety with mobility.    Impairments: decreased LE strength, decreased endurance/activity tolerance, decreased functional mobility, decreased balance, and gait impairment    Therapy Diagnosis: impaired functional mobility    Rehabilitation Potential: good    Treatment Activities: Initial evaluation, bed mobility, transfers, gait, and patient education    Educated the patient to role of physical therapy, plan of care, goals of therapy and safety with mobility and ADLs, spine precautions.     PLAN:   Treatment/Interventions: Investment banker, operational, Museum/gallery curator, Functional transfer training, LE strengthening/ROM, Endurance training, Patient/family training, Equipment eval/education, Bed mobility     PT Frequency: 4-5x/wk   Risks/Benefits/POC Discussed with Pt/Family: With patient     Unit: [facility] TOWER 8  Bed: F831/F831.01       Precautions  and Contraindications:   Falls  Spinal Precautions (no bending, lifting or twisting)  Brace Precautions: Back brace: TLSO and only when OOB    Consult received for Delphia Grates for PT Evaluation and Treatment.  Patient's medical condition is appropriate for Physical therapy intervention at this time.     HISTORY OF PRESENT ILLNESS:   Per H&P:  KRYSTOL ROCCO is a 81 y.o. 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 [name] 05/24/2022 9:23 PM     Lab Results   Component Value Date/Time    TROPI <2.7 05/25/2022 02:08 AM    TROPI Unable toCalc. 05/25/2022 02:08 AM    TROPI 8.1 05/24/2022 10:13 PM        SOCIAL HISTORY:   Prior Level of Function:  Prior level of function: Independent with ADLs and  Independent with mobility using a walking stick, with difficulty due to foot numbness  Baseline activity level: community distances  DME Currently at Home: Shower chair and walking stick    Home Living Arrangements (plans to d/c to daughter's home for 2 weeks, then son's home. Daughter's home info detailed below):   Living Arrangements: patient lives with husband, plans to d/c to daughter's home initially with son available to assist  Type of Home: House  Home Layout: Multi-level, bedroom/bathroom downstairs  Stairs  to Enter:  2 stairs to enter; flight to bedroom/bathroom. Pt reports no rail on flight of stairs down to basement level.     SUBJECTIVE: "I need to go to the bathroom first."   Patient is agreeable to participation in the therapy session. Nursing clears patient for therapy.     Patient goal: to get better    Pain Assessment:  No report of pain at rest     OBJECTIVE:   Observation of Patient:   Patient is in bed with dressings, telemetry and SCDs in place.  Patient wore mask during therapy session: No      Observation of Vital Signs:  BP: 126/54 after transfer to the chair     Cognition/Neuro Status:  Behavior: calm, cooperative, attentive  Arousal/Alertness: Appropriate responses to stimuli  Orientation Level: Person, Place , Time, and Situation  Attention Span: Appears intact  Memory: Appears intact  Safety Awareness: minimal verbal instruction  Command Following: Follows all commands and directions without difficulty  Insights: Educated in Engineer, building services  Problem Solving: Assistance required to identify errors made  Motor Planning: Intact      Musculoskeletal Examination:  Gross ROM  RUE: within normal limits  LUE: within normal limits  RLE: within normal limits  LLE: within normal limits    Gross Strength  RUE: 4/5  LUE: 4/5  RLE: 3+/5  LLE: 3+/5    Functional Mobility:  PMP - Progressive Mobility Protocol   PMP Activity: Step 6 - Walks in Room  Distance Walked (ft) (Step 6,7): 30 Feet    Bed Mobility:     Supine to Sit: Stand-by assistance via log roll    Transfers:  Sit to Stand: Contact-guard assistance   Stand to Sit: Contact-guard assistance   Bed to Chair: Contact-guard assistance   Device: Front wheel walker    Ambulation:  Distance: 30 Feet   Device: Front wheel walker  Assist Level: Minimal assistance for steadying  Pattern: decreased pace, decreased cadence, shuffle    Balance:  Static sitting: good   Dynamic sitting: good   Static standing: good ; at rolling walker  Dynamic standing: fair      Sensation:  Right UE: intact   Left UE: intact   Right LE: intact   Left LE: intact     Participation and Activity Tolerance:  Participation Effort: good  Patient Endurance: good    Patient left seated in a bedside chair with call bell within reach, all needs met, and all questions answered    SCD's off  Floor mat in place  Chair alarm in place    Goals:   Goals  Goal Formulation: With patient  Time for Goal Acheivement: 7 visits  Goals: Select goal  Pt Will Go Supine To Sit: modified independent  Pt Will Perform Sit To Supine: modified independent, with log roll  Pt Will Transfer Bed/Chair: with rolling walker, with supervision  Pt Will Ambulate: 101-150 feet, with rolling walker, with supervision  Pt Will Go  Up / Down Stairs: 1 flight, With SPC, with contact guard assist  Other Goal: Pt will go up/down 2 steps without rails with LRAD with CGA    PPE worn during session: gloves  Tech present: N/A  PPE worn by tech: N/A    Time of treatment:   PT Received On: 05/25/22  Start Time: 0955  Stop Time: 1037  Time Calculation (min): 42 min    Kallie Edward, PT, DPT  Pager [phone]

## 2022-05-25 NOTE — UM Notes (Signed)
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     INITIAL VS:  97.7, 76, 18, 93% sats,  132/61    ------------------------------------------------------    IV ANCEF PRE-OP X TWO    IV FENTANYL PRN FOR MODERATE PAIN (needing several doses)    IV APRESOLINE X ONE    IV DILAUDID Q 5 MIN PRN FOR PAIN (is using)    L.R at 50 cc/hr    IV ALBUMIN cont infusion    IV DECADRON, IV PEPCID, IV ROBINOL. ETC.....WHILE IN O.R      Plan:   XLIF L2-3/PSF L2-3     History of Present Illness:   Stephanie Castaneda is a 81 y.o. female who presents to the hospital with numbness in the RLE(knee to foot) and bilateral leg weakness that she feels is worsening. She reports that she has had several instances where she was close to falling due to the leg weakness. She received an L4/5 TFESI by Dr. Chandra Batch 11/2021. She has trialed and failed conservative management of symptoms. Therefore, she is here to proceed with proposed surgical intervention.    TAKEN TO O.R FOR THE FOLLOWING:    Operative Procedure:   L2-L3 XLIF, L2-L3 POSTERIOR DECOMPRESSION AND FUSION: 22558 (CPT)  LAMINECTOMY,  L3-4 POSTERIOR LUMBAR, DECOMP, INST, FUSION LEVEL1:      Preoperative Diagnosis:   Pre-Op Diagnosis Codes:     * Lumbar radiculopathy [M54.16]     Postoperative Diagnosis:   Post-Op Diagnosis Codes:     * Lumbar radiculopathy [M54.16]      ===============================================================================      9/16.  INPATIENT CSR    Interim History:   OR Date 05/24/22: OR for L2-3 XLIF and PSF     24h Events:   - Chest pain from overnight resolved. No additional events noted. Feels well this morning.  - AFVSS. Ambulated in room. Voiding  spont. Tolerating applesauce. Pain appears controlled.  - Endorses increased LLE strength and improved sensation postop.    Assessment:   Stephanie Castaneda is a 81 y.o. female with PMH cervical fusion presenting w/ lumbar spondylosis, BLE distal weakness, now POD 0 (9/15) s/p L2-3 XLIF and PSF with decompression. LLE with improved strength and sensation postop.    CURRENT VS:    BP 112/43   Pulse 81   Temp 97.9 F (36.6 C) (Oral)   Resp 18   Ht 1.575 m (5\' 2" )   Wt 56.5 kg (124 lb 9 oz)   SpO2 95%   BMI 22.78 kg/m        Plan:   - Primary medical management per [facility].  - Vital signs per unit routine  - q4h neurochecks  - No further chest pain workup required unless symptoms recur  - Imaging:  - No further imaging unless neurologic decline  - Activity/Positioning:  - Ad lib  - TLSO brace when OOB              - PMP without brace okay              - May don/doff at edge of bed  - Out of bed to chair Qshift  - Ambulate TID with  assist  - Encourage IS  - Diet:              - Advance diet as tolerated to regular diet  - Medications:  - Multimodal pain control with QD bowel regimen.  - Resume home medications exceptASA 81  - No therapeutic anticoagulants/antiplatelets/NSAIDs                          - May resume ASA in 7 days  - Consults:  - PT/OT eval and treat  - DVT Prophylaxis:  - SCDs  - Chemo DVT PPx: May initiate POD 2 if remains hospitalized  - Wound Care:  - Dressing down 9/17 by neurosurgeyr team  - Suture/Staple removal by NSGY team in 2 weeks  - Disposition:              - Floor              - Anticipate discharge home pending PT/OT 9/17 if pain controlled.    ------------------------------------------------------    Medications:             Current Facility-Administered Medications   Medication Dose Route Frequency    acetaminophen  650 mg Oral 4 times per day    calcitonin (salmon)  1 spray Alternating Nares Daily    artificial tears (REFRESH PLUS)  1 drop Both Eyes BID    ceFAZolin  2 g  Intravenous Q8H    levothyroxine  88 mcg Oral Daily at 0600    losartan  12.5 mg Oral Daily    metoprolol succinate XL  12.5 mg Oral Daily    rosuvastatin  10 mg Oral Daily    senna-docusate  1 tablet Oral BID    sodium chloride (PF)  3 mL Intravenous Q8H    solifenacin  10 mg Oral Daily    spironolactone  12.5 mg Oral Daily    vitamin B-12  1,000 mcg Oral Daily

## 2022-05-25 NOTE — Plan of Care (Addendum)
Assessment  Neuro:Patient is alert and oriented x4. Neurovascular unchanged. Baseline neuropathy otherwise full sensation.  Mobility: As tolerated. Ambulates w/ walker x1 assist.   Cardiovascular: Regular rhythm.  Pulmonary: Clear, on RA. Encouraging IS use.  GI: Diet reg. Last BM 9/13.  GU: Voids in bathroom.  Skin/incision: Dressing location on back & L flank. Clean, dry, intact.   Safety/Fall Prevention: Bed alarm on. Falls mat within place. Call bell within reach.   Pain: Patient rates pain 2-5/10. Current pain regimen: tylenol, flexeril, tramadol.  Offered pharmacological and non-pharmacological interventions.       Notable Shift Events  Pain managed w/ current pain regimen. TLSO brace order faxed, but allowed to get OOB w/o brace until arrival. Ambulated in room but unable to get to hallway d/t double vision/nausea/vomiting; zofran given. Voiding well in bathroom.          Problem: Moderate/High Fall Risk Score >5  Goal: Patient will remain free of falls  Outcome: Progressing     Problem: Lumbar Discectomy/Laminectomy/Fusion  Goal: Free from infection  Outcome: Progressing  Goal: Hemodynamic stability  Outcome: Progressing  Goal: Stable neurovascular status  Outcome: Progressing  Goal: Mobility/activity is maintained at optimum level for patient  Outcome: Progressing  Goal: Pain at adequate level as identified by patient  Outcome: Progressing  Goal: Patient will maintain normal GI status  Outcome: Progressing  Goal: Patient/patient care companion demonstrates understanding of surgery/treatment plan, medication, and discharge plans  Outcome: Progressing     Problem: Safety  Goal: Patient will be free from injury during hospitalization  Outcome: Progressing  Goal: Patient will be free from infection during hospitalization  Outcome: Progressing     Problem: Pain  Goal: Pain at adequate level as identified by patient  Outcome: Progressing     Problem: Side Effects from Pain Analgesia  Goal: Patient will experience  minimal side effects of analgesic therapy  Outcome: Progressing     Problem: Discharge Barriers  Goal: Patient will be discharged home or other facility with appropriate resources  Outcome: Progressing     Problem: Psychosocial and Spiritual Needs  Goal: Demonstrates ability to cope with hospitalization/illness  Outcome: Progressing

## 2022-05-25 NOTE — Consults (Signed)
[place] [facility] C[facility] CONSULTATION REPORT    [facility]  [facility] TOWER 8  [address]  [place] [address]  Loc: [phone]      Date Time: 05/25/22 10:47 AM  Patient Name: Stephanie Castaneda  Requesting Physician: Gae Dry, MD    Reason for Consultation:   Chest pain    Chief Complaint:   Chest pain    History of Present Illness:   Stephanie Castaneda is a 81 y.o. female admitted on 05/24/2022.  We have been asked by Gae Dry, MD to provide cardiac consultation as indicated above.      POD0 s/p L2-3 XLIF and PSF w/ decompression after presenting with lumbar spondylosis and b/l LE weakness.  I got a call a last night that she was having CP but HR elevated and SBP  elevated.  Gave suggestions like BB or hydralazine and to order cardiac enzymes.      She tells me for long time she had ches pain just left of her sternum.  She walks all the time.  This morning, after walker, done and lay down, felt it transiently again.  Not associated with N/V/diaphoresis.      No chest pain, dyspnea with exertion itself, PND/orthopnea, lightheadedness/syncope, palpitations, or edema.  Sx are not like tako tsubo in May              Past Medical/Surgical History:   Patient  has a past medical history of Cervical stenosis of spinal canal, Hypercholesterolemia, and Hypothyroidism.    Patient  has a past surgical history that includes LAMINECTOMY, ANTERIOR LUMBAR DECOMPRESSION, LEVEL 1 (1971); Cesarean section (1988); HYSTERECTOMY, ABDOMINAL, TOTAL (2005); Pelvic floor repair (2005); DISCECTOMY, ANTERIOR CERVICAL, FUSION, LEVELS 2 (N/A, 09/21/2020); APPENDECTOMY (OPEN) (1962); and Colonoscopy (2013).    Family History:   Patient's family history includes Heart attack in her father; Stroke in her mother.    Social History:   Patient  reports that she has never smoked. She has never used smokeless tobacco. She reports that she does not currently use alcohol. She reports that she does not  currently use drugs.    Allergies:     Allergies   Allergen Reactions    Oxycodone Nausea And Vomiting    Hydrocodone Nausea And Vomiting       Medications:     Current Outpatient Medications   Medication Instructions    Acetaminophen (TYLENOL PO) Oral    ASPIRIN 81 PO Oral    calcitonin, salmon, (MIACALCIN) 200 UNIT/ACT nasal spray 1 spray, Nasal, Daily    Carboxymethylcellulose Sodium (REFRESH TEARS OP) Ophthalmic    cyanocobalamin 1,000 mcg, Oral, Daily    losartan (COZAAR) 12.5 mg, Oral, Daily    metoprolol succinate XL (TOPROL-XL) 12.5 mg, Oral, Daily    Misc. Devices Kit Bone growth stimulator    rosuvastatin (CRESTOR) 10 MG tablet 1 tablet, Oral, Daily    solifenacin (VESICARE) 10 MG tablet 1 tablet, Oral, Daily    spironolactone (ALDACTONE) 12.5 mg, Oral, Daily    Synthroid 88 MCG tablet 1 tablet, Oral, Daily       Inpatient Scheduled Meds: PRN Meds:    acetaminophen, 650 mg, Oral, 4 times per day  calcitonin (salmon), 1 spray, Alternating Nares, Daily  artificial tears (REFRESH PLUS), 1 drop, Both Eyes, BID  ceFAZolin, 2 g, Intravenous, Q8H  levothyroxine, 88 mcg, Oral, Daily at 0600  losartan, 12.5 mg, Oral, Daily  metoprolol succinate XL, 12.5 mg, Oral, Daily  rosuvastatin, 10 mg, Oral, Daily  senna-docusate, 1 tablet, Oral, BID  sodium chloride (PF), 3 mL, Intravenous, Q8H  solifenacin, 10 mg, Oral, Daily  spironolactone, 12.5 mg, Oral, Daily  vitamin B-12, 1,000 mcg, Oral, Daily        Continuous Infusions:   sodium chloride      lactated ringers Stopped (05/24/22 2047)    lactated ringers 50 mL/hr at 05/24/22 2200    benzocaine-menthol, 1 lozenge, PRN  bisacodyl, 10 mg, Daily PRN  cyclobenzaprine, 5 mg, TID PRN  diphenhydrAMINE, 25 mg, Q6H PRN  HYDROmorphone, 2 mg, Q4H PRN  magnesium hydroxide, 10 mL, Q4H PRN  morphine, 2 mg, Q2H PRN  naloxone, 0.4 mg, PRN  ondansetron, 4 mg, Q8H PRN   Or  ondansetron, 4 mg, Q8H PRN  promethazine, 25 mg, Q6H PRN   Or  promethazine, 25 mg, Q6H PRN   Or  promethazine  (PHENERGAN) IVPB, 6.25 mg, Q6H PRN  sodium phosphate, 1 enema, Once PRN  traMADol, 50 mg, Q6H PRN          Physical Exam:   Patient  height is 1.575 m (5\' 2" ) and weight is 56.5 kg (124 lb 9 oz). Her oral temperature is 98.2 F (36.8 C). Her blood pressure is 127/66 and her pulse is 96. Her respiration is 18 and oxygen saturation is 96%.     Physical Exam  Vitals and nursing note reviewed.   Constitutional:       Appearance: Normal appearance.   HENT:      Right Ear: External ear normal.      Left Ear: External ear normal.      Nose: Nose normal.      Mouth/Throat:      Mouth: Mucous membranes are moist.   Eyes:      General: No scleral icterus.  Cardiovascular:      Rate and Rhythm: Normal rate and regular rhythm.      Heart sounds: Normal heart sounds.   Pulmonary:      Effort: Pulmonary effort is normal. No respiratory distress.      Breath sounds: No wheezing.      Comments: Limited exam due to brace  Abdominal:      General: There is no distension.      Tenderness: There is no abdominal tenderness.   Musculoskeletal:      Cervical back: Neck supple.      Right lower leg: No edema.      Left lower leg: No edema.   Skin:     General: Skin is warm and dry.   Neurological:      General: No focal deficit present.      Mental Status: She is alert and oriented to person, place, and time.   Psychiatric:         Mood and Affect: Mood normal.         Behavior: Behavior normal.         Lab/Test Findings:     ECG personally reviewed: pending    Recent Labs     05/25/22  0208   hs Troponin-I <2.7   hs Troponin-I Delta Unable toCalc.       ASSESSMENT:   Patient is a 81 y.o. female with the following cardiology relevant diagnoses.      Chest pain, negative hsTrop with elevated systolics last night s/p 5 mg IV hydralazine  POD0 s/p L2-3 XLIF and PSF w/ decompression after presenting with lumbar spondylosis and b/l LE  weakness.   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    Negative hsTroponin and recent cath without obstructive disease - unlikely ACS.    EKG today.  If normal will sign off.    Cont CAD RF modification as you are with BB, ARB and statin.    Thank you for consult, please call us with questions.        Recommendations discussed with primary medical team.  -------------------------------------------------------------------------------------  Signed by:         Grace Blight, MD, [facility]  [facility]    [facility] Contact Information   [facility]  Secure Chat (Group):   [facility] [place] [facility]    APP Spectralink:  [phone]    MD Spectralink :  [phone]  [phone]    After hours, non urgent consult line:  [phone]    After hours, physician on-call:  [phone] [facility]  Secure Chat (Group):   [facility] [place] [facility]    APP Spectralink:  [phone]    MD Spectralink :  [phone]      After hours, non urgent consult line:  [phone]    After hours, physician on-call:  [phone] [facility]  Secure Chat (Group):   [facility] [place] [facility]    APP Spectralink:  [phone]    MD Spectralink :  [phone]      After hours, non urgent consult line:  [phone]    After hours, physician on-call:  [phone] [facility]  Secure Chat (Group):   [facility] [place] [facility]    APP Spectralink:  [phone]    MD Spectralink :  [phone]      After hours, non urgent consult line:  [phone]    After hours, physician on-call:  [phone]       This note was generated by the Dragon speech recognition and may contain errors or omissions not intended by the user. Grammatical errors, random word insertions, deletions, pronoun errors, and incomplete sentences are occasional consequences of this technology due to software limitations. Not all errors are caught or corrected. If there are questions or concerns about the  content of this note or information contained within the body of this dictation, they should be addressed directly with the author for clarification.

## 2022-05-25 NOTE — Plan of Care (Signed)
Stephanie Castaneda is a 81 y.o. female who presents to the hospital on 05/24/2022       Procedure(s):  L2-L3 XLIF, L2-L3 POSTERIOR DECOMPRESSION AND FUSION  LAMINECTOMY,  L3-4 POSTERIOR LUMBAR, DECOMP, INST, FUSION LEVEL1    1 Day Post-Op  -------------------     Most Recent Set of Vitals   Visit Vitals  BP 147/67   Pulse 94   Temp 97.9 F (36.6 C) (Oral)   Resp 18   Ht 1.575 m (5\' 2" )   Wt 56.5 kg (124 lb 9 oz)   SpO2 95%   BMI 22.78 kg/m       Pt A&O X4. VSS.  Tolerated whole pills with thin liquids.  Pt able to ambulate to move to chair as well as reposition self in bed with assistance.   Encouraged TCDB and IS use.   Continent of bowel and bladder. LBM 05/22/2022.  Pt voiding via bathroom toilet.   Pain Controlled with schedule medication.   Good pedal pulses bilaterally,cap refill <3 sec in all 4 extremities.  All dressings are clean, dry, and intact and all sites appear free from visual signs of infection.    Call bell and bedside table within reach at all times. Will continue to monitor patient  closely.     EKG done, NSR. Telemetry shows NSR. Patient denies SOB, chest pain, blurry vision, or nausea, or vomiting.   Will continue to monitor.   Notable Shift Events        Intake and Output Summary (Last 24 hours) at Date Time    Intake/Output Summary (Last 24 hours) at 05/25/2022 1337  Last data filed at 05/25/2022 0500  Gross per 24 hour   Intake 2550 ml   Output 1150 ml   Net 1400 ml         Problem: Moderate/High Fall Risk Score >5  Goal: Patient will remain free of falls  Outcome: Progressing     Problem: Moderate/High Fall Risk Score >5  Goal: Patient will remain free of falls  Outcome: Progressing     Problem: Lumbar Discectomy/Laminectomy/Fusion  Goal: Free from infection  Outcome: Progressing  Goal: Hemodynamic stability  Outcome: Progressing  Goal: Stable neurovascular status  Outcome: Progressing  Goal: Mobility/activity is maintained at optimum level for patient  Outcome: Progressing  Goal: Pain at  adequate level as identified by patient  Outcome: Progressing  Goal: Patient will maintain normal GI status  Outcome: Progressing  Goal: Patient/patient care companion demonstrates understanding of surgery/treatment plan, medication, and discharge plans  Outcome: Progressing     Problem: Lumbar Discectomy/Laminectomy/Fusion  Goal: Free from infection  Outcome: Progressing     Problem: Lumbar Discectomy/Laminectomy/Fusion  Goal: Hemodynamic stability  Outcome: Progressing     Problem: Lumbar Discectomy/Laminectomy/Fusion  Goal: Hemodynamic stability  Outcome: Progressing     Problem: Lumbar Discectomy/Laminectomy/Fusion  Goal: Stable neurovascular status  Outcome: Progressing     Problem: Lumbar Discectomy/Laminectomy/Fusion  Goal: Stable neurovascular status  Outcome: Progressing     Problem: Lumbar Discectomy/Laminectomy/Fusion  Goal: Mobility/activity is maintained at optimum level for patient  Outcome: Progressing     Problem: Lumbar Discectomy/Laminectomy/Fusion  Goal: Mobility/activity is maintained at optimum level for patient  Outcome: Progressing     Problem: Lumbar Discectomy/Laminectomy/Fusion  Goal: Pain at adequate level as identified by patient  Outcome: Progressing     Problem: Lumbar Discectomy/Laminectomy/Fusion  Goal: Patient will maintain normal GI status  Outcome: Progressing     Problem: Lumbar Discectomy/Laminectomy/Fusion  Goal: Patient will  maintain normal GI status  Outcome: Progressing     Problem: Lumbar Discectomy/Laminectomy/Fusion  Goal: Patient/patient care companion demonstrates understanding of surgery/treatment plan, medication, and discharge plans  Outcome: Progressing     Problem: Lumbar Discectomy/Laminectomy/Fusion  Goal: Patient/patient care companion demonstrates understanding of surgery/treatment plan, medication, and discharge plans  Outcome: Progressing     Problem: Safety  Goal: Patient will be free from injury during hospitalization  Outcome: Progressing  Goal: Patient  will be free from infection during hospitalization  Outcome: Progressing     Problem: Safety  Goal: Patient will be free from injury during hospitalization  Outcome: Progressing     Problem: Safety  Goal: Patient will be free from injury during hospitalization  Outcome: Progressing     Problem: Safety  Goal: Patient will be free from infection during hospitalization  Outcome: Progressing     Problem: Safety  Goal: Patient will be free from infection during hospitalization  Outcome: Progressing     Problem: Pain  Goal: Pain at adequate level as identified by patient  Outcome: Progressing     Problem: Pain  Goal: Pain at adequate level as identified by patient  Outcome: Progressing     Problem: Side Effects from Pain Analgesia  Goal: Patient will experience minimal side effects of analgesic therapy  Outcome: Progressing     Problem: Side Effects from Pain Analgesia  Goal: Patient will experience minimal side effects of analgesic therapy  Outcome: Progressing     Problem: Side Effects from Pain Analgesia  Goal: Patient will experience minimal side effects of analgesic therapy  Outcome: Progressing     Problem: Discharge Barriers  Goal: Patient will be discharged home or other facility with appropriate resources  Outcome: Progressing     Problem: Discharge Barriers  Goal: Patient will be discharged home or other facility with appropriate resources  Outcome: Progressing     Problem: Discharge Barriers  Goal: Patient will be discharged home or other facility with appropriate resources  Outcome: Progressing     Problem: Psychosocial and Spiritual Needs  Goal: Demonstrates ability to cope with hospitalization/illness  Outcome: Progressing     Problem: Psychosocial and Spiritual Needs  Goal: Demonstrates ability to cope with hospitalization/illness  Outcome: Progressing

## 2022-05-25 NOTE — Plan of Care (Signed)
Progress note:   Assumed care of patient. Pt A&O X4. VSS. No SOB. Lying in bed.  surgical dressings are clean, dry, and intact.  Pt able to ambulate with aide of walker.   Continent of bowel and bladder. LBM 05/22/22. Milk of Magnesia administered this morning.   Good pedal pulses bilaterally,cap refill <3 sec in all 4 extremities.  Denies Nausea and vomiting.  Encouraged TCDB and IS use.   Will continue hourly rounding and maintain safety, call light remains within reach. Pt utilizes this to express her needs.   Pain: Patient denies pain this shift.    Diet: regular  Voiding: bathroom  Mobility: .walker plus one person assist   Tele: yes  Safety/Fall Interventions: Safety checks in place. Bed in lowest position, call bell and belongings within reach. Will continue to monitor.       Most Recent Set of Vitals   Visit Vitals  BP 113/58   Pulse 88   Temp 98.1 F (36.7 C) (Oral)   Resp 17   Ht 1.575 m (5\' 2" )   Wt 56.5 kg (124 lb 9 oz)   SpO2 93%   BMI 22.78 kg/m             Procedure(s):  L2-L3 XLIF, L2-L3 POSTERIOR DECOMPRESSION AND FUSION  LAMINECTOMY,  L3-4 POSTERIOR LUMBAR, DECOMP, INST, FUSION LEVEL1    2 Days Post-Op  -------------------     Patient Lines/Drains/Airways Status       Active Lines, Drains and Airways       Name Placement date Placement time Site Days    Peripheral IV 05/24/22 18 G Left Forearm 05/24/22  1120  Forearm  1                    Notable Shift Events      Athira Janowicz Mawu Klu, RN  05/26/22  6:51 AM   Problem: Moderate/High Fall Risk Score >5  Goal: Patient will remain free of falls  Outcome: Progressing     Problem: Lumbar Discectomy/Laminectomy/Fusion  Goal: Free from infection  Outcome: Progressing  Goal: Hemodynamic stability  Outcome: Progressing  Goal: Stable neurovascular status  Outcome: Progressing  Goal: Mobility/activity is maintained at optimum level for patient  Outcome: Progressing  Goal: Pain at adequate level as identified by patient  Outcome: Progressing  Goal: Patient will  maintain normal GI status  Outcome: Progressing  Goal: Patient/patient care companion demonstrates understanding of surgery/treatment plan, medication, and discharge plans  Outcome: Progressing     Problem: Safety  Goal: Patient will be free from injury during hospitalization  Outcome: Progressing  Goal: Patient will be free from infection during hospitalization  Outcome: Progressing     Problem: Pain  Goal: Pain at adequate level as identified by patient  Outcome: Progressing     Problem: Side Effects from Pain Analgesia  Goal: Patient will experience minimal side effects of analgesic therapy  Outcome: Progressing     Problem: Discharge Barriers  Goal: Patient will be discharged home or other facility with appropriate resources  Outcome: Progressing     Problem: Psychosocial and Spiritual Needs  Goal: Demonstrates ability to cope with hospitalization/illness  Outcome: Progressing

## 2022-05-25 NOTE — Nursing Progress Note (Signed)
4 eyes in 4 hours pressure injury assessment note:      Completed with: [facility]  Unit & Time admitted: NT8             Bony Prominences: Check appropriate box; if wound is present enter wound assessment in LDA     Occiput:                 [x] WNL  []  Wound present  Face:                     [x] WNL  []  Wound present  Ears:                      [x] WNL  []  Wound present  Spine:                    [x] WNL  []  Wound present  Shoulders:             [x] WNL  []  Wound present  Elbows:                  [x] WNL  []  Wound present  Sacrum/coccyx:     [x] WNL  []  Wound present  Ischial Tuberosity:  [x] WNL  []  Wound present  Trochanter/Hip:      [x] WNL  []  Wound present  Knees:                   [x] WNL  []  Wound present  Ankles:                   [x] WNL  []  Wound present  Heels:                    [x] WNL  []  Wound present  Other pressure areas:  []  Wound location       Device related: []  Device name:         LDA completed if wound present: yes/no  Consult WOCN if necessary    Other skin related issues, ie tears, rash, etc, document in Integumentary flowsheet    Abrasion L knee, R thigh

## 2022-05-25 NOTE — Progress Notes (Signed)
NEUROSURGERY PROGRESS NOTE    Date Time: 05/26/22 9:17 AM  Patient Name: Delphia GratesFORSYTH,Ronnett M  Consulting Attending Physician: Dr. Deloria LairHamilton  Covered By: Team A: [phone]    Assessment:   Delphia GratesCaryol M Muzyka is a 81 y.o. female with PMH cervical fusion presenting w/ lumbar spondylosis, BLE distal weakness, now POD 2 (9/15) s/p L2-3 XLIF and PSF with decompression. LLE with improved strength and sensation postop.    Plan:   - Primary medical management per [facility].  - Vital signs per unit routine  - q4h neurochecks  - No further chest pain workup required unless symptoms recur  - Imaging:  - No further imaging unless neurologic decline  - Activity/Positioning:  - Ad lib  - TLSO brace when OOB   - PMP without brace okay   - May don/doff at edge of bed  - Out of bed to chair Qshift  - Ambulate TID with assist  - Encourage IS  - Diet:   - Advance diet as tolerated to regular diet  - Medications:  - Multimodal pain control with QD bowel regimen.  - Resume home medications except ASA 81  - No therapeutic anticoagulants/antiplatelets/NSAIDs    - May resume ASA in 7 days (9/22)  - Consults:  - PT/OT eval and treat  - DVT Prophylaxis:  - SCDs  - Chemo DVT PPx: Hold given impending discharge.   - Wound Care:  - Dressing down 9/17 by neurosurgery team  - Suture/Staple removal by NSGY team in 2 weeks  - Disposition:   - Discharge home today 9/17.   - Follow-up in clinic in 2 weeks for wound check.   - Follow-up in clinic in 6 weeks with repeat UXR with Dr. Deloria LairHamilton for postop eval.    Plan discussed and formulated with Dr. Deloria LairHamilton.    Interim History:   05/24/22: OR for L2-3 XLIF and PSF    24h Events:   - NAEO. AFVSS. No additional episodes of CP >24h.   - Voiding spont. Ambulating. Tolerating PO. Pain controlled.    Medications:     Current Facility-Administered Medications   Medication Dose Route Frequency    acetaminophen  650 mg Oral 4 times per day    calcitonin (salmon)  1 spray Alternating Nares Daily    artificial tears  (REFRESH PLUS)  1 drop Both Eyes QID    gabapentin  100 mg Oral Q8H SCH    levothyroxine  88 mcg Oral Daily at 0600    losartan  12.5 mg Oral Daily    metoprolol succinate XL  12.5 mg Oral Daily    rosuvastatin  10 mg Oral Daily    senna-docusate  1 tablet Oral BID    sodium chloride (PF)  3 mL Intravenous Q8H    solifenacin  10 mg Oral Daily    spironolactone  12.5 mg Oral Daily    vitamin B-12  1,000 mcg Oral Daily     Physical Exam:     Vitals:    05/26/22 0755   BP: 117/57   Pulse: 98   Resp: 18   Temp: 98.6 F (37 C)   SpO2: 92%     Intake and Output Summary (Last 24 hours) at Date Time  No intake or output data in the 24 hours ending 05/26/22 0917  General Appearance:  NAD  HEENT: atraumatic, normocephalic. Trachea midline.  CV: Hemodynamically stable, appears well-perfused  Pulm: Equal chest rise, normal effort, no respiratory distress  GI: soft, NTND  Ext: No edema, distal pulses present  Skin: No wounds  Incision: clean dry and intact, dressing in place x2    NEURO:  A&O x3  PERRLA 58mm                        Language: intact    CN II-XII grossly intact         Motor:  Normal bulk and tone throughout. No abnormal movements.    LE  Hip F  (L2) Knee E (L3) DF (L4)  EHL (L5) PF  (S1)    R  5/5 5/5 5/5 5/5 5/5   L 5/5 5/5 4/5 4/5 4+/5     Sensory: Sensation intact to LT throughout. No spinal level.       Labs:     Lab Results   Component Value Date    WBC 9.06 05/26/2022    HGB 10.0 (L) 05/26/2022    HCT 29.7 (L) 05/26/2022    MCV 95.2 05/26/2022    PLT 182 05/26/2022     Lab Results   Component Value Date    NA 138 05/26/2022    K 4.0 05/26/2022    CL 109 05/26/2022    CO2 23 05/26/2022     Lab Results   Component Value Date    INR 0.9 05/06/2022    PT 10.7 05/06/2022     Rads:     Radiology Results (24 Hour)       ** No results found for the last 24 hours. **          Signed by: Rivka Spring, MD  Neurosurgery  PGY-6  05/26/22 9:17 AM

## 2022-05-26 DIAGNOSIS — M5416 Radiculopathy, lumbar region: Secondary | ICD-10-CM

## 2022-05-26 DIAGNOSIS — I10 Essential (primary) hypertension: Secondary | ICD-10-CM

## 2022-05-26 LAB — ECG 12-LEAD
Atrial Rate: 90 {beats}/min
Atrial Rate: 87 {beats}/min
IHS MUSE NARRATIVE AND IMPRESSION: NORMAL
IHS MUSE NARRATIVE AND IMPRESSION: NORMAL
P Axis: 77 degrees
P Axis: 72 degrees
P-R Interval: 166 ms
P-R Interval: 164 ms
Q-T Interval: 336 ms
Q-T Interval: 348 ms
QRS Duration: 82 ms
QRS Duration: 84 ms
QTC Calculation (Bezet): 411 ms
QTC Calculation (Bezet): 418 ms
R Axis: 79 degrees
R Axis: 80 degrees
T Axis: 83 degrees
T Axis: 79 degrees
Ventricular Rate: 90 {beats}/min
Ventricular Rate: 87 {beats}/min

## 2022-05-26 LAB — BASIC METABOLIC PANEL
Anion Gap: 6 (ref 5.0–15.0)
BUN: 12 mg/dL (ref 7.0–21.0)
CO2: 23 mEq/L (ref 17–29)
Calcium: 8.8 mg/dL (ref 7.9–10.2)
Chloride: 109 mEq/L (ref 99–111)
Creatinine: 0.7 mg/dL (ref 0.4–1.0)
Glucose: 98 mg/dL (ref 70–100)
Potassium: 4 mEq/L (ref 3.5–5.3)
Sodium: 138 mEq/L (ref 135–145)
eGFR: >60 mL/min/{1.73_m2} (ref >=60)

## 2022-05-26 LAB — CBC
Absolute NRBC: 0 10*3/uL (ref 0.00–0.00)
Hematocrit: 29.7 % — ABNORMAL LOW (ref 34.7–43.7)
Hgb: 10 g/dL — ABNORMAL LOW (ref 11.4–14.8)
MCH: 32.1 pg (ref 25.1–33.5)
MCHC: 33.7 g/dL (ref 31.5–35.8)
MCV: 95.2 fL (ref 78.0–96.0)
MPV: 10 fL (ref 8.9–12.5)
Nucleated RBC: 0 /100 WBC (ref 0.0–0.0)
Platelets: 182 10*3/uL (ref 142–346)
RBC: 3.12 10*6/uL — ABNORMAL LOW (ref 3.90–5.10)
RDW: 13 % (ref 11–15)
WBC: 9.06 10*3/uL (ref 3.10–9.50)

## 2022-05-26 MED ORDER — LACTULOSE 10 GM/15ML PO SOLN
20.0000 g | Freq: Four times a day (QID) | ORAL | Status: DC | PRN
Start: 2022-05-26 — End: 2022-05-26

## 2022-05-26 NOTE — OT Eval Note (Signed)
Occupational Therapy Eval Stephanie Castaneda        Post Acute Care Therapy Recommendations:     Discharge Recommendations:  Home with supervision, Home with home health OT    DME needs IF patient is discharging home: Reacher, Dressing stick, Long-handled sponge, Sock aid    Therapy discharge recommendations may change with patient status.  Please refer to most recent note for up-to-date recommendations.    Assessment:   Significant Findings: none    Stephanie Castaneda is a 81 y.o. female admitted 05/24/2022.  Patient presents with BLE distal weakness, now s/p L2-3 XLIF and PSF with decompression. Pt received in chair with TLSO donned and agreeable to OT evaluation.  Pt educated on spinal precautions with pt verbalizing understanding and demonstrating good carryover t/o session.  Pt required SBA for sit<>stand, during household mobility with RW, during car transfer, when toileting.  Mod I for LB dressing with DME following education and no ADL questions/concerns at end of session.  Positioned for comfort in chair and needs met.  Pt appears to be close to functional baseline at this time.  Will d/c OT services, please re-consult if pt's status changes.      Therapy Diagnosis: assessed ADL independence    Rehabilitation Potential: good    Treatment Activities: Initial OT eval, ADLs, functional mobility/transfers, pt training     Educated the patient to role of occupational therapy, plan of care, goals of therapy and safety with mobility and ADLs, energy conservation techniques, spine precautions.    Plan:   OT Frequency Recommended: one time visit - therapy discontinued     Treatment Interventions: ADL retraining;Functional transfer training;UE strengthening/ROM;Endurance training;Patient/Family training;Equipment eval/education     Risks/benefits/POC discussed: yes      Unit: [facility] TOWER 8  Bed: F831/F831.01         Precautions and Contraindications:   Precautions  Weight Bearing Status: no  restrictions  Back Brace Applied: yes, OOB (TLSO)  Spinal Precautions: no bending, no twisting, no lifting  Other Precautions: Falls      Consult received for [employer] for OT Evaluation and Treatment.  Patient's medical condition is appropriate for Occupational Therapy intervention at this time.    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 female admitted on 05/24/2022 with BLE distal weakness, now s/p L2-3 XLIF and PSF with decompression.      Past Medical/Surgical History:  Past Medical History:   Diagnosis Date    Cervical stenosis of spinal canal     pre-op dx    Hypercholesterolemia     Hypothyroidism         Imaging/Tests/Labs:  Fluoroscopy less than 1 hour    Result Date: 05/24/2022   Fluoroscopic guidance provided without the presence of a radiologist. Stephanie Castaneda 05/24/2022 9:23 PM      Social History:   Prior Level of Function:  Prior level of function: Independent with ADLs, Ambulates independently  Baseline Activity Level: Community ambulation  Ambulated 100 feet or more prior to admission: Yes  DME Currently at Home: ADL- Paediatric nurse (walking stick)    Home Living Arrangements:  Living Arrangements: Spouse/significant other  Type of Home: House  Home Layout: Two level (2 STE, FOS down to bed/bath)  Bathroom Shower/Tub: Walk-in shower  DME Currently at Home: ADL- Paediatric nurse (walking stick)  Subjective: "This makes it a lot easier." Re: LB dressing DME.   Patient is agreeable to participation in the therapy session. Nursing clears patient for therapy.     Pain Assessment  Pain Assessment: No/denies pain    Objective:   Observation of Patient/Vital Signs:  Patient is seated in a bedside chair with TLSO in place.  Pt wore mask during therapy session:No      Cognitive Status and Neuro Exam:  Cognition/Neuro Status  Arousal/Alertness: Appropriate responses to  stimuli  Attention Span: Appears intact  Orientation Level: Oriented X4  Memory: Appears intact  Following Commands: independent  Safety Awareness: minimal verbal instruction  Insights: Fully aware of deficits;Educated in safety awareness  Problem Solving: Able to problem solve independently  Behavior: attentive;calm;cooperative    Neuro Status  Behavior: attentive;calm;cooperative         Musculoskeletal Examination  Gross ROM  Right Upper Extremity ROM: within functional limits  Left Upper Extremity ROM: within functional limits    Gross Strength  Right Upper Extremity Strength: within functional limits  Left Upper Extremity Strength: within functional limits              Sensory/Oculomotor Examination  Sensory  Auditory: intact  Tactile - Light Touch: intact  Visual Acuity: wears glasses         Activities of Daily Living  Self-care and Home Management  Eating: Independent  Grooming: Stand by Assist  Bathing: Stand by Assist  UB Dressing: Stand by Assist  LB Dressing: Modified Independent;sitting;Don/doff R sock;Don/doff L sock;Thread RLE into pants;Thread LLE into pants;Thread RLE into underwear;Thread LLE into underwear (after education with DME)  Toileting: Stand by Assist    Functional Mobility:  Mobility and Transfers  Sit to Stand: Stand by Assist  Functional Mobility/Ambulation: Stand by Assist (with RW)     PMP Activity: Step 7 - Walks out of Room     Balance  Balance  Static Sitting Balance: Stand by Assist  Dyanamic Sitting Balance: Stand by Assist  Static Standing Balance: Stand by Assist  Dynamic Standing Balance: Stand by Assist (with RW)    Participation and Activity Tolerance  Participation and Endurance  Participation Effort: good  Endurance: Endurance does not limit participation in activity    Patient left with call bell within reach, all needs met, SCDs off as found, fall mat in place, chair alarm off as found and all questions answered. RN notified of session outcome and patient response.        Goals:  Time For Goal Achievement: 2 visits  ADL Goals  Patient will groom self: Stand by Assist, at sinkside, 2 visits, Goal met  Patient will dress upper body: Stand by Assist, 2 visits  Patient will dress lower body: Modified Independent, with AE, 2 visits, Goal met  Patient will toilet: Stand by Assist, 2 visits, Goal met  Mobility and Transfer Goals  Pt will perform functional transfers: Stand by Assist, 2 visits, Goal met                           PPE worn during session: procedural mask and gloves    Tech present: n/a  PPE worn by tech: N/A    Berline Lopes, OTR/L  Pager [phone]        Time of Treatment:   OT Received On: 05/26/22  Start Time: 1020  Stop Time: 1050  Time Calculation (min): 30 min

## 2022-05-26 NOTE — PT Progress Note (Signed)
Physical Therapy Note    IPhysical Therapy Treatment  Stephanie Ewing SchleinM Top      Post Acute Care Therapy Recommendations:     Discharge Recommendations:  Home with supervision      DME needs IF patient is discharging home: Front wheel walker    Therapy discharge recommendations may change with patient status.  Please refer to most recent note for up-to-date recommendations.    Assessment:   Significant Findings: none    Stephanie Castaneda is a 81 y.o. female admitted 05/24/2022.  Patient is pending discharge today.  She was received in bathroom.  Ambulated in room and reviewed safety, pain management, progression and home safety.  Patient presents with impaired mobility due to pain, spine precautions, decreased strength, decreased balance, decreased endurance. Pt will benefit from additional PT to improve safety and functional level, however is demonstrates good safety, will have family support.           Therapy Diagnosis: Impaired mobility, abnormal gait      Rehabilitation Potential:  good for goals    Treatment Activities: Bed mobility, transfers, gait training, stair training    Educated the patient to role of physical therapy, plan of care, goals of therapy, safety with mobility and ADLs, spine precautions, discharge instructions, home safety, brace management.    Plan:   Treatment/Interventions: Investment banker, operationalGait training, Stair training, Functional transfer training, LE strengthening/ROM, Endurance training, Patient/family training, Equipment eval/education, Bed mobility     PT Frequency: 4-5x/wk           [facility]  Bed: F831/F831.01      Precautions:   Falls  Spine  TLSO may be donned when sitting      Updated medical information:   Reviewed    Subjective:  No questions at this time.  I feel comfortable about going home.             Patient's medical condition is appropriate for Physical Therapy intervention at this time.  Patient is agreeable to participation in the therapy session. Nursing  clears patient for therapy.    Pain:   Scale: Not rated, but pt appears ~ 2-3/10 by Wong-Baker FACES scale  Location: surgical  Intervention: Mobilized and positioned      Objective:   Patient is seated in a bedside chair with telemetry, SCD's, peripheral IV, and TLSO in place.    Pt wore mask during therapy session:No        Cognition :     A&OX4. Pleasant and cooperative.    Functional Mobility:  Bed: NT  Sit to Stand/stand to sit: SB VCs/SB VCs  Transfers: SB with FWW    PMP - Progressive Mobility Protocol   PMP Activity: Step 6 - Walks in Room  Distance Walked (ft) (Step 6,7): 25 Feet     Ambulation  Level of Assistance required: Mod i  Ambulation Distance:  25  Device used: FWW  Pattern: slow cadence, decreased foot clearance/step length  Stair Management: NT, verbally reviewed      Balance  Sitting (Static/dynamic): I/I  Standing (Static/dynamic):  SB/Sb with FWW    Patient Participation: excellent  Patient Endurance: excellent    Patient left in chair with call bell within reach, all needs met and all questions answered. RN notified of session outcome and patient response.   SCDs: yes  Fall mat: yes  Bed alarm: n/a  Chair alarm:  no    Goals:  Goals  Goal Formulation: With  patient  Time for Goal Acheivement: 7 visits  Goals: Select goal  Pt Will Go Supine To Sit: modified independent  Pt Will Perform Sit To Supine: modified independent, with log roll  Pt Will Transfer Bed/Chair: with rolling walker, with supervision, Partly met  Pt Will Ambulate: 101-150 feet, with rolling walker, with supervision, Partly met  Pt Will Go Up / Down Stairs: 1 flight, With SPC, with contact guard assist  Other Goal: Pt will go up/down 2 steps without rails with LRAD with CGA      PPE worn during session: procedural mask    Tech present: none  PPE worn by tech: N/A        Time of Treatment  PT Received On: 05/26/22  Start Time: 1430  Stop Time: 1501  Time Calculation (min): 31 min  Treatment # 3 out of 7      Thank you,    Suzan Garibaldi, PT, Pager [phone]

## 2022-05-26 NOTE — Progress Notes (Signed)
Peripheral IV removed, dulcolax suppository given x2, both came out. She refused the enema. Daughter and patient aware that needs for laxative, pt passes good gas.     All discharge instructions given to the daughter and patient. Patient's dressing with steri strip, c/d/I. Will continue to monitor.

## 2022-05-26 NOTE — Progress Notes (Signed)
Called daughter for update; that patient is going home today.   Dulcolax suppository given. Result pending.

## 2022-05-26 NOTE — PT Progress Note (Signed)
Physical Therapy Note    IPhysical Therapy Treatment  Stephanie Castaneda      Post Acute Care Therapy Recommendations:     Discharge Recommendations:  Home with supervision, Home with home health PT      DME needs IF patient is discharging home: Front wheel walker    Therapy discharge recommendations may change with patient status.  Please refer to most recent note for up-to-date recommendations.    Assessment:   Significant Findings: none    Stephanie Castaneda is a 81 y.o. female admitted 05/24/2022.  Pt concerned due to her daughter's house not having a banister for interior stairs.  Practiced stairs without rails using assist of one person (see below).  Additionally, practiced bed mobility, once pt received education regarding technique, pt was able to manage stair mobility independently.  Patient presents with impaired mobility due to pain, spine precautions, decreased strength, decreased balance, decreased endurance. Pt will benefit from additional PT to improve safety and functional level.            Therapy Diagnosis: Impaired mobility, abnormal gait      Rehabilitation Potential:  good for goals    Treatment Activities: Bed mobility, transfers, gait training, stair training    Educated the patient to role of physical therapy, plan of care, goals of therapy, safety with mobility and ADLs, spine precautions, discharge instructions, home safety, brace management.    Plan:   Treatment/Interventions: Investment banker, operational, Stair training, Functional transfer training, LE strengthening/ROM, Endurance training, Patient/family training, Equipment eval/education, Bed mobility     PT Frequency: 4-5x/wk           Unit: [facility]  Bed: F831/F831.01      Precautions:   Falls  Spine  TLSO may be donned when sitting      Updated medical information:   Reviewed    Subjective:  I feel good about stairs             Patient's medical condition is appropriate for Physical Therapy intervention at this time.   Patient is agreeable to participation in the therapy session. Nursing clears patient for therapy.    Pain:   Scale: 3/10  Location: surgical  Intervention: Mobilized and positioned      Objective:   Patient is seated in a bedside chair with telemetry, SCD's, peripheral IV, and TLSO in place.    Pt wore mask during therapy session:No        Cognition :     A&OX4. Pleasant and cooperative.    Functional Mobility:  Bed: Independent once provided with instruction using spine technique  Sit to Stand/stand to sit: SB VCs/SB VCs  Transfers: SB with FWW    PMP - Progressive Mobility Protocol   PMP Activity: Step 7 - Walks out of Room  Distance Walked (ft) (Step 6,7): 150 Feet     Ambulation  Level of Assistance required: Mod i  Ambulation Distance:  150  Device used: FWW  Pattern: slow cadence, decreased foot clearance/step length  Stair Management: Hand held assist for ascending,  descending      Balance  Sitting (Static/dynamic): I/I  Standing (Static/dynamic):  SB/Sb with FWW    Patient Participation: excellent  Patient Endurance: excellent    Patient left in chair with call bell within reach, all needs met and all questions answered. RN notified of session outcome and patient response.   SCDs: yes  Fall mat: yes  Bed alarm: n/a  Chair  alarm:  no    Goals:  Goals  Goal Formulation: With patient  Time for Goal Acheivement: 7 visits  Goals: Select goal  Pt Will Go Supine To Sit: modified independent  Pt Will Perform Sit To Supine: modified independent, with log roll  Pt Will Transfer Bed/Chair: with rolling walker, with supervision, Partly met  Pt Will Ambulate: 101-150 feet, with rolling walker, with supervision, Partly met  Pt Will Go Up / Down Stairs: 1 flight, With SPC, with contact guard assist  Other Goal: Pt will go up/down 2 steps without rails with LRAD with CGA      PPE worn during session: procedural mask    Tech present: none  PPE worn by tech: N/A        Time of Treatment  PT Received On: 05/26/22  Start Time:  1140  Stop Time: 1230  Time Calculation (min): 50 min  Treatment # 2 out of 7      Thank you,    Suzan Garibaldi, PT, Pager [phone]

## 2022-05-26 NOTE — Discharge Summary (Signed)
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]    Problems:   Lists the present on admission hospital problems  Present on Admission:   Lumbar stenosis with neurogenic claudication   Chest pain      Problem Lists:  Patient Active Problem List   Diagnosis    Cervical stenosis of spinal canal    History of back surgery    Bilateral foot-drop    Numbness in both legs    Lumbar radiculopathy    Narcotic-induced nausea and vomiting    Takotsubo cardiomyopathy    Hypertension    History of seizure    Hypothyroidism    Coronary artery disease    Left atrial dilation    Myxomatous mitral valve    Mild mitral valve prolapse    Mild mitral regurgitation    Mild tricuspid regurgitation    PONV (postoperative nausea and vomiting)    Lumbar stenosis with neurogenic claudication    Chest pain       Discharge Dx:   Lumbar radiculopathy [M54.16]  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]  hx hypothyroid, HLD, [place] admit 5/1-01/10/22 for NSTEMI + Takotsubo cardiomyopathy (heart catheterization showed patent coronary arteries - initial EF was 25-30% but improved to 60-65% on 03/18/22)   Intermittent chest pain  Consultations:   Treatment Team: Attending Provider: Gae Dry, MD; Surgeon: Marlaine Hind, MD; Consulting Physician: Gae Dry, MD; Physical Therapist: Michell Heinrich, PT; Registered Nurse: Ellie Lunch, RN; Case Manager: Joslyn Devon, RN    Procedures performed:     Fluoroscopy less than 1 hour   Final Result       Fluoroscopic guidance provided without the presence of a radiologist.      [name]   05/24/2022 9:23 PM          Presenting history and hospital Course:     Per CNS note "Stephanie Castaneda is a 81 y.o.  female hx hypothyroid, HLD, [place] admit 5/1-01/10/22 for NSTEMI + Takotsubo cardiomyopathy (heart catheterization showed patent coronary arteries - initial EF was 25-30% but improved to 60-65% on 03/18/22) who underwent L2-3 fusion with Dr. Deloria Lair. In the PACU she had chest pain. By the time I saw her it had resolved. She denies shortness of breath or diaphoresis. These symptoms are sudden onset, moderate intensity, without alleviating factors."  Patient seen discussed with bedside nurse.  Patient is currently on room air.  She is tolerating food by mouth.  Her last bowel movement was this last Wednesday.  She is urinating without the use of Foley.  Patient reports denies headache dizziness, shortness of breath or chest pain.  Patient was evaluated by PT/OT recommended for home.  Discharged home order was placed on 05/26/2022.  Plan/summary:  Chest pain - hs troponin was 8.1 -> 2.7.  Seen by cardiology-signed off. Continue BB, ARB and statin Telemetry. Repeat EKG no change     Hx hypothyroid, HLD, [place] admit 5/1-01/10/22 for NSTEMI + Takotsubo cardiomyopathy (heart catheterization showed patent coronary arteries - initial EF was 25-30% but improved to 60-65% on 03/18/22) - Noted.      DVT/GI prophylaxis - SCDs.   Physical exam  at discharge:  Vitals:    05/26/22 0755   BP: 117/57   Pulse: 98   Resp: 18   Temp: 98.6 F (37 C)   SpO2: 92%     General: awake, alert, no acute distress.  HEENT: perrla, eomi, sclera anicteric  oropharynx clear without lesions, mucous membranes moist  Neck: supple  Cardiovascular: regular rate and rhythm, no murmurs, rubs or gallops  Lungs: clear to auscultation bilaterally, without wheezing, rhonchi, or rales  Abdomen: Normal active bowel sounds  Extremities: no clubbing, cyanosis, or edema  Neuro: Appears symmetric all extremities antigravity      Discharge Medications:        Medication List        START taking these medications      cyclobenzaprine 5 MG tablet  Commonly  known as: FLEXERIL  Take 1 tablet (5 mg) by mouth 3 (three) times daily as needed for Muscle spasms     gabapentin 100 MG capsule  Commonly known as: NEURONTIN  Take 1 capsule (100 mg) by mouth every 8 (eight) hours for 28 days     magnesium hydroxide 400 MG/5ML suspension  Commonly known as: MILK OF MAGNESIA  Take 10 mLs by mouth every 4 (four) hours as needed for Constipation     traMADol 50 MG tablet  Commonly known as: ULTRAM  Take 1 tablet (50 mg) by mouth every 6 (six) hours as needed for Pain            CONTINUE taking these medications      calcitonin (salmon) 200 UNIT/ACT nasal spray  Commonly known as: MIACALCIN  1 spray by Nasal route daily     cyanocobalamin 1000 MCG tablet     losartan 25 MG tablet  Commonly known as: COZAAR     metoprolol succinate XL 25 MG 24 hr tablet  Commonly known as: TOPROL-XL     Misc. Devices Kit  Bone growth stimulator     REFRESH TEARS OP     rosuvastatin 10 MG tablet  Commonly known as: CRESTOR     solifenacin 10 MG tablet  Commonly known as: VESICARE     spironolactone 25 MG tablet  Commonly known as: ALDACTONE     Synthroid 88 MCG tablet  Generic drug: levothyroxine     TYLENOL PO            STOP taking these medications      ASPIRIN 81 PO               Where to Get Your Medications        These medications were sent to [facility]  [address] [place] [address]      Hours: Monday - Friday 8AM to 8PM, Saturday - Sunday 8AM to 8PM Phone: [phone]   cyclobenzaprine 5 MG tablet  gabapentin 100 MG capsule  magnesium hydroxide 400 MG/5ML suspension  traMADol 50 MG tablet        Discharge Instructions:   [facility]  [address]  [place] [address]  [phone]  Follow up  For wound re-check with RN    Marlaine Hind, MD  [address]  [place] [address]  [phone]    Follow up in 6 week(s)  For post-op visit with Dr. Deloria Lair    your primary care proivider    Call in  1 day(s)  to schedule follow up appt  [name], [name], MD  [address]  [place] [id number]  [phone]    Call in 1 day(s)  to schedule follow up appt        TIME SPENT:   On discharge and care coordination is 35 minutes.      Signed by: Gae DryBrigid C Chrisotpher Rivero, MD, MD    CC to: Marisa SprinklesPcp, None, MD

## 2022-05-27 ENCOUNTER — Other Ambulatory Visit: Payer: Self-pay | Admitting: Nurse Practitioner

## 2022-05-27 ENCOUNTER — Encounter: Payer: Self-pay | Admitting: Neurological Surgery

## 2022-05-27 MED ORDER — ONDANSETRON 4 MG PO TBDP
4.0000 mg | ORAL_TABLET | Freq: Three times a day (TID) | ORAL | 0 refills | Status: AC | PRN
Start: 2022-05-27 — End: ?

## 2022-05-27 NOTE — Progress Notes (Addendum)
Involvement - Spine     Reason for visit - Fitting and delivery      Ordering physician - Dewar, Manuela Schwartz, MD    Rx - TLSO for OOB use      Dx - L2-3 XLIF and PSF with decompression     Clinical plan - DAW     Goal - Provide support and limit range of motion     Clinical outcome - Brace meets specifications of order and is free from defects     Patient caretaker is able to properly don and doff device - YES     Fitting and delivery by [facility]

## 2022-05-28 NOTE — Op Note (Addendum)
Procedure Date:05/24/22  SURGEON: Marlaine Hind MD   ASSISTANT: Karoline Caldwell     PREOPERATIVE DIAGNOSES:   1. Lumbar axial pain with bilateral lumbar radicular pain secondary to degenerative disk disease with L2/3  2.  L2/3  degenerative disc disease and bilateral   L2/3  severe foraminal and central and lateral recess  stenosis   3. Mechanical low back pain  4 L2/3   bilateral facet hypertrophy with facet effusion and reactive changes suggestive of instability   5 Difficulty with ambulation   6 Cauda equina compression   7. L2/3 Modic changes   8. Prior Lumbar surgery   9 L3/4 Stenosis     POSTOPERATIVE DIAGNOSES:   1. Lumbar axial pain with bilateral lumbar radicular pain secondary to degenerative disk disease with L2/3  2.  L2/3  degenerative disc disease and bilateral   L2/3  severe foraminal and central and lateral recess  stenosis   3. Mechanical low back pain  4 L2/3   bilateral facet hypertrophy with facet effusion and reactive changes suggestive of instability   5 Difficulty with ambulation   6 Cauda equina compression   7. L2/3 Modic changes   8. Prior Lumbar surgery   9 L3/4 stenosis     TITLE OF PROCEDURE:       1.L2-L3 anterior lumbar diskectomy with left retroperitoneal transpsoas  Approach  2. Placement of an intervertebral parralell biomechanical cage at L2-L3  with allograft .   7.L2/3 anterior arthrodesis   8. Anterior plate with vertebral body screws in L2 and L3   9 Allograft     ESTIMATED BLOOD LOSS:   50 mL.   COMPLICATIONS:   None.   Implants:Zimvie  8x 18 x 45 mm 0degrees ; Lateral plate with 30 mm screws in L2 and L3        INDICATIONS: 81 y.o. female who presents to the hospital with numbness in the RLE(knee to foot) and bilateral leg weakness that she feels is worsening. She reports that she has had several instances where she was close to falling due to the leg weakness. She received an L4/5 TFESI by Dr. Chandra Batch 11/2021. She has trialed and failed conservative management of symptoms. Therefore,  she is here to proceed with proposed surgical intervention.Given failure of conservative management, here today to proceed with proposed surgical intervention.       I would recommend proceeding with XLIF L2/3 , with posterior decompression and fusion at L2-3 and hemilaminectomy at L3-4.  Patient was apprised of the risks including bleeding, infection, paralysis, injury to the bowel, malposition   of instrumentation, failure to fuse, injury to the psoas muscle with   weakness either temporary or permanent, and chronic pain in  leg, and  has elected to proceed.      DESCRIPTION OF PROCEDURE:      The patient was brought into the operating room, and positioned lateral decubitus position left side up.  At this point, the patient was prepped and draped in a   regular sterile fashion. An incision was made in the posterior portion of   the flank over the quadratus lumborum.         A direct lateral incision was also   made. Using blunt dissection through the retroperitoneal space, a plane   was developed down to the transverse process. The corridor was connected to the   more direct lateral incision. A small incision was made in the direct   lateral plane and was  dissected down and the retroperitoneal plane and down   through the psoas muscle using intraoperative neural monitoring to check for the   lumbar plexus.  The psoas was manually retracted posteriorly and repositioning of the probe  was   performed to the anterior 25th of the disk space at L2/3. The psoas muscle was dissected through down to the disk space at L2/3 and a K-wire was docked at the anterior quintile of the disc space. A series of dilators were then placed over the initial dilator and the K-wire.There was  no significant stimulation of the lumbar plexus noted after the second dilator was placed.     Stimulation at this point was > 10 mamp in all planes .  As the dilators were placed, a   retractor was then placed over the last dilator and expanded.    The posterior blade was expanded posteriorly under the lumbar plexus.  After   placement of each dilator, stimulation was performed simultaneously to be   sure that there was no injury to the significant motor component of the   lumbar plexus. After dilating the retractor, light sources were placed,   and the areas were probed for firing of the muscles innervated by the   lumbar plexus. No firing was noted within the area of the disk space exposed. Disc shims were placed and the surgery was conducted through that protected corridor.        The blades were widened in a anterior to posterior direction .   This allowed access to the disk space. At this point, an   annulotomy was performed, and using a combination of angled curettes, shavers and   pituitary rongeur, and angled periosteal elevators the disk space was removed by sneaking under the psoas muscle and   lumbar plexus in an mildly oblique ant to posterior direction through the disc space.   . A periosteal elevator was used to perform the annulotomy on the contralateral side, and this was all   done under direct fluoroscopy. A complete diskectomy at L2-L3 was performed with care to preserve the endplate. A   series of dilators was then placed, and an 10 degree interbody cage was deemed   to be appropriate. This PEEK cage was packed with  Allograft+ BMP,   it was placed across the disk space  to the opposite vertebral body in a direct lateral fashion ,   apophyseal ring to apophyseal ring, with a nice recreation of normal disk height   and indirect decompression of the foraminal stenosis. This resulted in indirect foraminal decompression.  The lateral anterior plate was then sized and vertebral body screws were placed at L2 and L3.  Awl was placed followed by screw placement.       At this point, the retractors were loosened and backed out.   The psoas muscle was checked to ascertain that   there was no significant hematoma present.       The psoas muscle was  checked to ascertain that   there was no significant hematoma present. MEPS remain normal.   I wide AP fluoroscopy confirmed that there was no unplanned retained object besides implants.   The incision in the posterior area and the direct lateral incisions were closed with a 0 Vicryl stitch   through the subdermal layer, followed by a 4-0 running Monocryl stitch.   The wounds were then dressed with gauze and tape, and the patient was   returned to a supine  position. The estimated blood loss for this procedure   was approximately 50  mL.        JFH

## 2022-05-28 NOTE — Op Note (Addendum)
Procedure Date:05/24/22  SURGEON: Marlaine Hind MD   ASSISTANT: Karoline Caldwell     PREOPERATIVE DIAGNOSES:   1. Lumbar axial pain with bilateral lumbar radicular pain secondary to degenerative disk disease with L2/3  2.  L2/3  degenerative disc disease and bilateral   L2/3  severe foraminal and central and lateral recess  stenosis   3. Mechanical low back pain  4 L2/3   bilateral facet hypertrophy with facet effusion and reactive changes suggestive of instability   5 Difficulty with ambulation   6 Cauda equina compression   7. L2/3 Modic changes   8. Prior Lumbar surgery   9 L3/4 Stenosis     POSTOPERATIVE DIAGNOSES:   1. Lumbar axial pain with bilateral lumbar radicular pain secondary to degenerative disk disease with L2/3  2.  L2/3  degenerative disc disease and bilateral   L2/3  severe foraminal and central and lateral recess  stenosis   3. Mechanical low back pain  4 L2/3   bilateral facet hypertrophy with facet effusion and reactive changes suggestive of instability   5 Difficulty with ambulation   6 Cauda equina compression   7. L2/3 Modic changes   8. Prior Lumbar surgery   9 L3/4 stenosis     TITLE OF PROCEDURE:   Posterior non segmental fixation with pedicle screws  Navigation for screw placement  Evlyn Clines osteotomy L2/3   Transforaminal decompression L2/3 with microdiscectomy  Left L3/4 hemilaminectomy medial facetectomy and foraminotomy    ESTIMATED BLOOD LOSS:   10  0 mL.   COMPLICATIONS:   None.   Implants:Zimvie 4.0 x 45 mmm screws        INDICATIONS: 81 y.o. female who presents to the hospital with numbness in the RLE(knee to foot) and bilateral leg weakness that she feels is worsening. She reports that she has had several instances where she was close to falling due to the leg weakness. She received an L4/5 TFESI by Dr. Chandra Batch 11/2021. She has trialed and failed conservative management of symptoms. Therefore, she is here to proceed with proposed surgical intervention.Given failure of conservative  management, here today to proceed with proposed surgical intervention.       I would recommend proceeding with XLIF L2/3 , with posterior decompression and fusion at L2-3 and hemilaminectomy at L3-4.  Patient was apprised of the risks including bleeding, infection, paralysis, injury to the bowel, malposition   of instrumentation, failure to fuse, injury to the psoas muscle with   weakness either temporary or permanent, and chronic pain in  leg, and  has elected to proceed.      DESCRIPTION OF PROCEDURE:      Patient was positioned prone on [place] table.Patient was padded then prepped and draped . Fluoro was used to localize and mark incision.     Jamshidi needle was placed into the left superior iliac spine.  60 cc of bone marrow aspiration was obtained.  PRP and PPP was distilled.  PRP was used for arthrodesis and PPP was used to coat the incision.    Midline incision was made L2-4 Dorsal fascia was transected and bilateral subperiosteal dissection was carried out. O arm tracker was placed and a spin was performed. Pedicle screws  were placed with navigation assistance in L2 and L3. Alt followed by tap followed by screw placement. All screws stimulated at > 10 mamps.  An additional O-arm spin was performed all screws appear to be in good position.    Smith-Petersen osteotomy was  performed at L2-3.  The inferior facet of L2 superior facet of L3 was shaved down with Midas Rex drill hemilaminectomy and undercutting of the spinous process was completed with Kerrison rongeur.  This was followed by the ligamentum flavum utilize microsurgical technique.  Microscope was draped brought into the field.      A 5.5 mm rod was then placed through the L2 and 3 screws.  Locking caps were placed and tightened to the final torque strength.  Posterior lateral gutters were decorticated at L2-3 and allograft autograft PRP was placed for arthrodesis between L2-L3.      At the level of L3-4 left-sided hemilaminectomy medial facetectomy  was performed by thinning the lamina and the medial facet.  This followed by ligamentum flavum with curettes and Kerrison rongeurs.  In the left foramen was widened with a Kerrison rongeur decompressing the exiting nerve root.  All bone edges were waxed.  Hemostasis was obtained with paraspinals.  Marcaine was infiltrated muscle.  Fascia was reapproximated 0 Vicryl.  Fascia was closed with 2-0 Vicryl.  Dermis closed with Vicryl.  4 Monocryl subcuticular stitch was then placed.  Was dressed with sterile gauze and Tegaderm.      Patient was returned supine position extubated and transferred recovery.    JFH

## 2022-05-30 NOTE — Progress Notes (Signed)
Home Health Referral      Referral from Joslyn Devon, Stephanie Castaneda  (Case Manager) for home health care upon discharge.    By [employer], the patient has the right to freely choose [facility] home care provider.    Arrangements have been made with:    Castaneda company of the patients choosing. We have supplied the patient with Cast[facility] listing of providers in your area who asked to be included and participate in Medicare.   [facility], formerly [facility], Cast[facility]eda home care agency that provides adult home care services and participates in Medicare   The preferred provider of your insurance company. Choosing [facility] home care provider other than your insurance company's preferred provider may affect your insurance coverage.      Home Health Discharge Information    Your doctor has ordered Physical Therapy and Occupational Therapy in-home service(s) for you while you recuperate at home, to assist you in the transition from hospital to home.    The agency that you or your representative chose to provide the service:  Name of Home Health Agency Placement: [phone]        The above services were set up by:  Stephanie Begin, Stephanie Castaneda (Home Health Liaison) Phone [phone]      IF YOU HAVE NOT HEARD FROM YOUR HOME YOUR HOME HEALTH AGENCY WITHIN 24-48 HOURS AFTER DISCHARGE PLEASE CALL YOUR AGENCY TO ARRANGE [facility] TIME FOR YOUR FIRST VISIT. FOR ANY SCHEDULING CONCERNS OR QUESTIONS RELATED TO HOME HEALTH, SUCH AS TIME OR DATE PLEASE CONTACT YOUR HOME HEALTH AGENCY AT THE NUMBER LISTED ABOVE.    Additional comments:    Spoke to daughter Stephanie Castaneda and son Stephanie Castaneda on the phone for coordination of home health services , in agrement to PT and OT only , no Sn .  Home address as follows- [address] [place], [place]., [address]  DME - inplace     PCP- will arrange with [facility] - Stephanie Castaneda ,    But following surgeon is Stephanie [name], Stephanie Castaneda       TITLE OF PROCEDURE:       1.L2-L3 anterior lumbar diskectomy  with left retroperitoneal transpsoas  Approach  2. Placement of an intervertebral parralell biomechanical cage at L2-L3  with allograft .   7.L2/3 anterior arthrodesis   8. Anterior plate with vertebral body screws in L2 and L3   9 Allograft       Home Health face-to-face (FTF) Encounter (Order [id number])  Consult  Date: 05/30/2022 Department: [facility] Ordering/Authorizing: Stephanie Dry, Stephanie Castaneda     Order Information    Order Date/Time Release Date/Time Start Date/Time End Date/Time   05/30/22 04:41 PM None 05/26/22 12:00 AM 05/26/22 12:00 AM     Order Details    Frequency Duration Priority Order Class   Once 1  occurrence Routine Hospital Performed     Standing Order Information    Remaining Occurrences Interval Last Released     0/1 Once 05/30/2022              Provider Information    Ordering User Ordering Provider Authorizing Provider   Stephanie Begin, Stephanie Castaneda Stephanie Dry, Stephanie Castaneda Stephanie Dry, Stephanie Castaneda   Attending Provider(s) Admitting Provider PCP   Stephanie Hind, Stephanie Castaneda; Stephanie Dry, Stephanie Castaneda Stephanie Hind, Stephanie Castaneda Pcp, None, Stephanie Castaneda     Verbal Order Info  Action Created on Order Mode Entered by Responsible Provider Signed by Signed on   Ordering 05/30/22 1641 Telephone with readback Stephanie Castaneda, Stephanie Hassan Castaneda, Stephanie Castaneda Stephanie Castaneda, Stephanie Castaneda, Stephanie Castaneda             Comments    Lumbar radiculopathy M54.16   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                Home Health face-to-face (FTF) Encounter: Patient Communication     Not Released  Not seen         Order Questions    Question Answer   Date I saw the patient face-to-face: 05/26/2022   Medical conditions that necessitate Home Health care: B.  Functional impairment due to recent hospitalization/procedure/treatment    Castaneda.  Risk for complication/infection/pain requiring follow up and monitoring    D.  Chronic illness & risk for  re-hospitalization due to unstable disease status    E.  Exacerbation of disease requiring follow up monitoring   Clinical findings that support the need for Skilled Nursing. SN will: O. N/Castaneda   Clinical findings that support the need for Physical Therapy. PT will F.  Perform home safety assessment & develop safe in home exercise program   Clinical findings support the need for OT (needs SN/PT order).OT will Castaneda.  Develop in home program to improve ability to perform ADLs    B.  Develop restorative program to improve mobility and independence   Clinical findings that support the need for SLP. ST will F.  N/Castaneda   Per clinical findings, following services are medically necessary: PT    OT   Evidence this patient is homebound because: Castaneda.  Decreased endurance, strength, ROM, cadence, safety/judgment during mobility    G.  Fall risk due to impaired coordination, gait and decreased balance                    Process Instructions    Please select Home Care Services medically necessary.     Based on the above findings, I certify that this patient is confined to the home and needs intermittent skilled nursing care, physical therapry and / or speech therapy or continues to need occupational therapy. The patient is under my care, and I have initiated the establishment of the plan of care. This patient will be followed by Castaneda physician who will periodically review the plan of care.      Collection Information            Consult Order Info    ID Description Priority Start Date Start Time   [id number] Home Health face-to-face (FTF) Encounter Routine 05/26/2022 12:00 AM   Provider Specialty Referred to   ______________________________________ _____________________________________             Verbal Order Info    Action Created on Order Mode Entered by Responsible Provider Signed by Signed on   Ordering 05/30/22 1641 Telephone with readback Stephanie Castaneda, Stephanie Skolnick Castaneda, Stephanie Castaneda Stephanie Castaneda, Stephanie Castaneda, Stephanie Castaneda             Patient Information    Patient Name   Stephanie Castaneda,  Stephanie Castaneda Legal Sex   Female DOB   08-Jul-1941       Reprint Order Requisition    Home Health face-to-face (FTF) Encounter (Order #[id number]) on 05/30/22       Additional  Information    Associated Reports External References   Priority and Order Details [employer]       Account Information     Hosp Acct #   [id number] Patient Class   Inpatient Service  Ortho/ Spine Accommodation Code  Semi-Private      Admission Information     Admitting Physician:  Attending Physician: Stephanie Hind, Stephanie Castaneda    Unit  [facility] TOWER 8 E* L&D Status      Admitting Diagnosis: Lumbar radiculopathy; Lumbar stenosis with neurogenic claudication Room / Bed  F831/F831.01 L&D - Last Menstrual Cycle      Chief Complaint:       Admit Type:  Admit Date/Time:  Discharge Date/Time: Elective  05/24/2022 / 1045  05/26/2022 / 1658 Length of Stay: 2 Days    L&D EDD   Estimated Date of Delivery: None noted.      Patient Information              Home Address: [address]  [place] [address] Employer:  Employer Address:       ,     Main Phone: [phone] Employer Phone:     SSN: [id number]       DOB: Mar 03, 1941 (81 yrs)       Sex: Female Primary Care Physician: Pcp, None, Stephanie Castaneda   Marital Status: Married Referring Physician:       No ref. provider found   Race: White or Caucasian       Ethnicity: Not of Hispanic/Latino/S*       Emergency Contacts  Name Home Phone Work Phone Mobile Phone Relationship Stephanie Castaneda   [facility]     [phone] Daughter           Guarantor Information     Guarantor Name: MEDRITH, VEILLON Guarantor ID: [id number]   Guarantor Relationship to Pt: Self Guarantor Type: Personal/Family   Guarantor DOB:    July 26, 1941       Guarantor Address: [address]   [place], [place] [address]          Guarantor Home Phone: [phone] Guarantor Employer:        Guarantor Work Phone:   Pharmacologist Phone:                     [employer]     Insurance Name: MEDICARE [facility] MEDICARE PPO Subscriber Name: [employer]   Insurance  Address:    [address] [address]  [place], [place] [address] Subscriber DOB: 03/14/1941      Subscriber ID: [id number]   Insurance Phone: [phone] Pt Relationship to Sub:   Self   Insurance ID:         Group Name:   Preauthorization #: [id number]   Group #: [id number] Preauthorization Days:

## 2022-05-31 ENCOUNTER — Telehealth (INDEPENDENT_AMBULATORY_CARE_PROVIDER_SITE_OTHER): Payer: Self-pay

## 2022-05-31 NOTE — Telephone Encounter (Signed)
[facility] [employer] (previously [facility]):     Received referral to schedule a follow up appointment with the [facility] for [employer]. Spoke to patient and she declined to schedule an appointment at this time. Patient stated she lives in [place] and has a primary care provider there that she can follow up with.     Dawayne Patricia  Patient Access Associate II  [facility] [employer]   [phone]

## 2022-06-10 ENCOUNTER — Ambulatory Visit: Payer: Medicare PPO

## 2022-06-20 ENCOUNTER — Other Ambulatory Visit: Payer: Self-pay | Admitting: Nurse Practitioner

## 2022-06-20 MED ORDER — GABAPENTIN 100 MG PO CAPS
100.0000 mg | ORAL_CAPSULE | Freq: Three times a day (TID) | ORAL | 2 refills | Status: DC
Start: 2022-06-20 — End: 2022-06-25

## 2022-06-25 ENCOUNTER — Other Ambulatory Visit: Payer: Self-pay | Admitting: Nurse Practitioner

## 2022-06-25 DIAGNOSIS — Z981 Arthrodesis status: Secondary | ICD-10-CM

## 2022-06-25 MED ORDER — GABAPENTIN 100 MG PO CAPS
100.0000 mg | ORAL_CAPSULE | Freq: Three times a day (TID) | ORAL | 2 refills | Status: AC
Start: 2022-06-25 — End: 2022-09-23

## 2022-07-03 ENCOUNTER — Other Ambulatory Visit: Payer: Self-pay | Admitting: Nurse Practitioner

## 2022-07-10 ENCOUNTER — Encounter: Payer: Self-pay | Admitting: Neurological Surgery

## 2022-07-10 ENCOUNTER — Encounter: Payer: Self-pay | Admitting: Nurse Practitioner

## 2022-07-10 ENCOUNTER — Ambulatory Visit: Payer: Medicare PPO | Attending: Neurological Surgery | Admitting: Neurological Surgery

## 2022-07-10 VITALS — BP 172/68 | HR 112 | Resp 18 | Ht 61.0 in | Wt 127.8 lb

## 2022-07-10 DIAGNOSIS — Z981 Arthrodesis status: Secondary | ICD-10-CM

## 2022-07-10 NOTE — Progress Notes (Signed)
[facility]  Postop Note    HPI     Stephanie Castaneda is a 81 year-old female s/p cervical fusion, now six weeks s/p L2-3 XLIF/PSF. Does not have much pain. VAS 0/10. Takes APAP PRN. Has improved sensation in both feet. Wearing brace. Was taking gabapentin post-op, stopped medication ten days ago. Using bone stimulator and calcitonin nasal spray. Walking daily, uses walking stick with ambulation.    Physical Examination   VITAL SIGNS: Visit Vitals  BP 172/68   Pulse (!) 112   Resp 18   Ht 1.549 Castaneda (5\' 1" )   Wt 58 kg (127 lb 12.8 oz)   BMI 24.15 kg/Castaneda             Awake, alert, oriented x3, Follows commands  GCS: 15   Speech is clear  Attention span and concentration: intact  Recent and remote memory: intact     Gait Intact, ambulates with walking stick  Able to walk on toes and heels     Point tenderness: None      Motor Exam:                                                    R          L            L2-3     Iliopsoas (Hip Flexion)                        5          5  L3-4     Quadriceps (Knee Extension)             5          5  L5-S1   Hamstring (Knee Flexion)                   5          5  L4-5     Tibialis Anterior (Foot Dorsiflexion)    5          2  S1        Gastrocsoleus (Plantar Flexion)         5          5  L5        EHL (Toe Dorsiflexion)                       5          2        Decreased sensation BLE below the knee                Reflexes:           Patellar (L3-4): right  1+, left 1+  Achilles (L5-S1): right 1+, left 1+    Clonus: right negative, left negative   Babinski: right negative, left negative      Straight leg raise:  right negative, left negative      Wound Check: well-healed, no erythema, cellulitis or purulence     Radiology Interpretation   XR Lumbar Spine AP And Lateral    Result Date: 07/04/2022  [facility] [place] [facility], Stephanie Castaneda  EXAM PERFORMED AT: [id number]       DOB:07-05-1941                 [address] 07/03/2022     AGE:81   Gender:F              Physicians Only: [phone]      [name] PATEL NP                                   [F]      [address]      [place], [place] [address] LUMBAR SPINE 2 OR 3 VIEWS HISTORY: Status post lumbar spinal fusion. COMPARISON: Correlation with 06/12/2021 lumbar spine MRI. FINDINGS: Interval changes related to discectomy, left lateral plate and screw fixation, as well as bilateral posterior transpedicular screw and vertical rod fixation involving L2 and L3. Hardware is in expected configuration/alignment without evidence of significant loosening or other complication. There is improved alignment at this level. Otherwise unchanged vertebral body alignment with mild retrolisthesis of L1 on L2. No acute fracture. Vertebral body heights are maintained. No destructive focal osseous lesion. Degenerative changes involving the nonsurgical levels appear stable.     Expected postoperative appearance related to L2-L3 fixation. Electronically signed by: Susy Frizzle Castaneda.D. [facility] DF: 07/04/22    Impression/Plan     Stephanie Castaneda is a 81 year-old female six weeks s/p L2-3 XLIF/PSF. I personally reviewed the x-ray studies of the lumbar spine, demonstrating a stable instrumented fusion without evidence of hardware failure or malalignment.  Incision well-healed without complication. Continue brace, bone stimulator and calcitonin nasal spray. Advised patient to maintain spine precautions: no bending, lifting greater than 10 pounds, twisting. Use vitamin B12, Fish Oil, Coenzyme Q OTC. She will plan to follow-up in six weeks with repeat x-ray lumbar.    Follow-up   6 weeks with XR Lumbar  Patient personally seen and examined with Rondel Oh ,NP on 07/10/2022 who participated in the visit. The assessment and plan were formulated together  and has been reviewed and updated as needed.   Marlaine Hind, MD

## 2022-07-22 ENCOUNTER — Encounter: Payer: Self-pay | Admitting: Cardiology

## 2022-07-22 ENCOUNTER — Ambulatory Visit: Payer: Medicare PPO | Admitting: Physical Therapy

## 2022-07-22 ENCOUNTER — Ambulatory Visit: Payer: Medicare PPO | Attending: Cardiology | Admitting: Cardiology

## 2022-07-22 VITALS — BP 138/62 | HR 82 | Ht 62.0 in | Wt 123.2 lb

## 2022-07-22 DIAGNOSIS — I21A1 Myocardial infarction type 2: Secondary | ICD-10-CM | POA: Diagnosis not present

## 2022-07-22 DIAGNOSIS — I2510 Atherosclerotic heart disease of native coronary artery without angina pectoris: Secondary | ICD-10-CM

## 2022-07-22 DIAGNOSIS — E785 Hyperlipidemia, unspecified: Secondary | ICD-10-CM | POA: Diagnosis not present

## 2022-07-22 DIAGNOSIS — I3489 Other nonrheumatic mitral valve disorders: Secondary | ICD-10-CM

## 2022-07-22 DIAGNOSIS — I5181 Takotsubo syndrome: Secondary | ICD-10-CM

## 2022-07-22 NOTE — Patient Instructions (Signed)
Medication Instructions:   Stop taking Spironolactone  *If you need a refill on your cardiac medications before your next appointment, please call your pharmacy*   Lab Work: Not needed    Testing/Procedures: Not needed   Follow-Up: At [facility], you and your health needs are our priority.  As part of our continuing mission to provide you with exceptional heart care, we have created designated Provider Care Teams.  These Care Teams include your primary Cardiologist (physician) and Advanced Practice Providers (APPs -  Physician Assistants and Nurse Practitioners) who all work together to provide you with the care you need, when you need it.     Your next appointment:   6 month(s)  The format for your next appointment:   In Person  Provider:   Bryan Lemma, MD

## 2022-07-22 NOTE — Progress Notes (Signed)
Primary Care Provider: Mosetta PuttBlomgren, Peter, MD [facility] Cardiologist: Bryan Lemmaavid Ollen Rao, MD Electrophysiologist: None  Clinic Note: Chief Complaint  Patient presents with   Follow-up    3 months cardiac and initial postop cardiac visit after L-spine fusion.   Cardiomyopathy    Resolved Takotsubo cardiomyopathy.  No CHF symptoms.   ===================================  ASSESSMENT/PLAN   Problem List Items Addressed This Visit       Cardiology Problems   Takotsubo cardiomyopathy - Primary (Chronic)    As noted, her hospital visitation was actually not an ACS.  Was an Acute Takotsubo Cardiomyopathy syndrome.  She had significant anginal symptoms but also had to be reduced EF and CHF at that time.  Has been covered remarkably, her follow-up echocardiogram was completely normal with exception of some mild mitral valve disease.  No wall motion O'Malley and reduced EF actively resolved.  Plan: TID has not been taking spironolactone, we will simply just have her discontinue it altogether.  Open it was restarted postop, but apparently she had not been taking it preop) Continue low-dose Toprol and low-dose losartan cardioprotection Euvolemic, does not require diuretic.      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  The True Diagnosis Will Be Type II Myocardial Infarction - in the Vernacular - Demand Ischemia-Infarction.  She had an acute Takotsubo Syndrome which is not ACS.  She had acute onset chest pain which has been really symptoms consistent with ACS in the setting stressors of having fallen and injuring his head and face.  Ischemic evaluation was negative with normal coronaries by catheterization.  Wall motion on echo was consistent with Takotsubo, and the EF/wall motion abnormalities have completely resolved.  She now remains on low-dose losartan and  Toprol. With no evidence of CAD, she is not on aspirin      Myxomatous degeneration of mitral valve (Chronic)    Her murmur is somewhat difficult to hear.  I do not necessarily hear today but does a lot of talking in the background.  She has a myxomatous mitral valve with mild MVP and mild MS.  Will probably evaluate with echocardiogram in a couple years.      Hyperlipidemia with target LDL less than 100 (Chronic)    LDL 81 on low-dose rosuvastatin.  Continue current dose.  No change.       ===================================  HPI:    [name] Trujillo is a 81 y.o. female with PMH notable for HLD, hypothyroidism and recent hospitalization for Takotsubo Cardiomyopathy who is being seen today for 457-month follow-up at the request of Mosetta PuttBlomgren, Peter, MD.  May 2023: History of Takotsubo Cardiomyopathy with elevated troponin (not true non-STEMI), -> she witnessed her husband fall but walking in the driveway.  He landed on his face.  While caring for him before EMS arrival, she developed chest tightness and dyspnea.  Radiating to the jaw and left arm/shoulder.  She also had significant nausea and vomiting. ==> On evaluation in the ER, troponin levels were positive hence the diagnosis non-STEMI-which is not what she is not having. Initial echo EF was 25 to 30% -> started on low-doses of Toprol, losartan and spironolactone. F/uEcho August 2023: EF up to 60 to 65% with mild MR. ->  Toprol reduced to 12.5 mg daily.  Plan 397-month follow-up. Prior to this episode was walking only for half mile a day  [name] Trujillo was seen for her second hospital follow-up on April 09, 2022 by Bettina Gavia, NP.  This was actually follow-up after repeat 2D echo with improved EF as noted.  Doing remarkably well.  Taking daily walks again.  No chest pain or dyspnea with rest exertion.  No orthopnea PND or edema.  Working on avoiding salt intake.  There is a concern about cognitive slowing on Toprol therefore dose was reduced  to 12.5 mg daily.   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  Recent Hospitalizations:  05/24/2022 [facility], [place]; [name], MD): L2-L3 XLIF, L2-L3 POSTERIOR DECOMPRESSION AND FUSION  Had CP post-op that resolved quickly.  No dyspnea or diaphoresis.  Had been prolonged time for BM.= given Rx for MOM.   Reviewed  CV studies:    The following studies were reviewed today: (if available, images/films reviewed: From Epic Chart or Care Everywhere) Echo 03/18/2022: Echo  EF 60-65% p no RWMA Myxomatous MV with mild MS & mild late prolapse   Cardiac Cath: 01/09/2022: Widely patent coronaries with mild nonobstructive LAD plaquing.  Right dominant system.  Normal LVEDP.  Based on clinical presentation, findings are consistent with acute Takotsubo Cardiomyopathy Syndrome  Echo: 01/08/22:  Severely decreased LV function-EF 25 to 30%.  Mid to apical (mostly anterior) with normal basal motion.  GR 2 DD-elevated LAP.  Mildly dilated LA.  Aortic sclerosis with no stenosis.  No AI.  Normal MV with mild to moderate TR.  Mildly elevated RAP, and PAP (estimated 49 mg). If LAD CAD ruled out - consistent with Takotsubo CM Syndrome.   Interval History:   [name] Trujillo returns here today with her daughters doing remarkably well.  She is already back walking thoroughly.  She actually just got back home after doing her rehab.  She still has her back brace on but feels almost back pain.  She is doing remarkably well from a cardiac standpoint though with no chest pain or pressure with rest or exertion.  No PND orthopnea edema.  No palpitations or irregular heartbeats.  No syncope or near syncope, TIA or amaurosis fugax.  No claudication.  In point of clarification -- she DID NOT HAVE A NON-STEMI - which is an Acute Coronary Syndrome (ACS) Diagnosis.   She had ACUTE TAKOTSUBO (STRESS) CARDIOMYOPATHY with elevated Troponin Levels - this would be  considered "Demand Ischemia - Demand Infarction" & NOT associated with ACS/CAD.    She seems to be tolerating her medications without any major issues.  REVIEWED OF SYSTEMS   Review of Systems  Constitutional:  Negative for malaise/fatigue (Getting frustrated with not being able to g"get up & about" post-op as fast as she would like to.) and weight loss.  HENT:  Negative for congestion.   Respiratory:  Negative for shortness of breath.   Cardiovascular:  Negative for leg swelling.  Gastrointestinal:  Negative for blood in stool and melena.  Genitourinary:  Negative for hematuria.  Musculoskeletal:  Positive for back pain and joint pain.  Neurological:  Positive for dizziness (She did have some dizziness initially postop, but doing better.) and weakness (Legs are still little weak.).  Psychiatric/Behavioral:  Negative for depression and memory loss (Memory seems to be much intact.). The patient is not nervous/anxious and does not have insomnia.   All other systems reviewed and are negative.  I have reviewed and (if needed) personally updated the patient's problem list, medications, allergies, past  medical and surgical history, social and family history.   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   She had ACUTE TAKOTSUBO (STRESS) CARDIOMYOPATHY with elevated Troponin Levels - this would be considered "Demand Ischemia - Demand Infarction" & NOT associated with ACS/CAD.     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  - Cath with NO CAD. ==> RESOLVED: f/u Echo 03/2022: EF 60-65%.    PAST SURGICAL HISTORY   Past  Surgical History:  Procedure Laterality Date   APPEN[age]CTOMY     70-18 yo   BACK SURGERY     Age 64   CESAREAN SECTION     ECTOPIC PREGNANCY SURGERY     LAPAROSCOPIC HYSTERECTOMY     LEFT HEART CATH AND CORONARY ANGIOGRAPHY N/A 01/09/2022   Procedure: LEFT HEART CATH AND CORONARY ANGIOGRAPHY;  Surgeon: Tonny Bollman, MD;  Location: [facility] INVASIVE CV LAB;  Service: CV:: Widely patent coronaries with mild nonobstructive LAD plaquing.  Right dominant system.  Normal LVEDP.  Based on clinical presentation, findings are consistent with acute Takotsubo Cardiomyopathy Syndrome   POSTERIOR FUSION LUMBAR SPINE  05/24/2022   [facility], [place]; [name], MD): L2-L3 XLIF, L2-L3 POSTERIOR DECOMPRESSION AND FUSION   TRANSTHORACIC ECHOCARDIOGRAM  01/08/2022   Severely decreased LV function-EF 25 to 30%.  Mid to apical (mostly anterior) with normal basal motion.  GR 2 DD-elevated LAP.  Mildly dilated LA.  Aortic sclerosis with no stenosis.  No AI.  Normal MV with mild to moderate TR.  Mildly elevated RAP, and PAP (estimated 49 mg). If LAD CAD ruled out - consistent with Takotsubo CM Syndrome.   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     There is no immunization history on file for this patient.  MEDICATIONS/ALLERGIES   Current Meds  Medication Sig   acetaminophen (TYLENOL) 325 MG tablet Take 975 mg by mouth daily as needed for moderate pain.   Carboxymethylcellulose Sodium (REFRESH TEARS OP) Place 2 drops into both eyes 3 (three) times daily.   levothyroxine (SYNTHROID) 88 MCG tablet Take 88 mcg by mouth daily before breakfast.   losartan (COZAAR) 25 MG tablet Take 1/2 tablet (12.5 mg total) by mouth daily.   metoprolol succinate (TOPROL XL) 25 MG 24 hr tablet Take 0.5 tablets (12.5 mg total) by mouth daily.   rosuvastatin (CRESTOR) 10 MG tablet Take 1 tablet (10 mg  total) by mouth at bedtime.   solifenacin (VESICARE) 10 MG tablet Take 10 mg by mouth daily.   spironolactone (ALDACTONE) 25 MG tablet Take 1/2 tablet (12.5 mg total) by mouth daily.   vitamin B-12 (CYANOCOBALAMIN) 1000 MCG tablet Take 1,000 mcg by mouth daily.    Allergies  Allergen Reactions   Oxycodone Hcl Nausea And Vomiting   Primidone    Propranolol    Tolterodine    Hydrocodone Nausea And Vomiting    SOCIAL HISTORY/FAMILY HISTORY   Reviewed in Epic:   Social History   Tobacco Use  Smoking status: Never   Smokeless tobacco: Never  Substance Use Topics   Alcohol use: Yes    Comment: once or twice a month   Drug use: No   Social History   Social History Narrative   Lives at home w/ her husband    Right-handed   Caffeine: 2 cups of coffee in the morning   Family History  Problem Relation Age of Onset   Stroke Mother    Heart attack Father    Alcohol abuse Father    Neuropathy Neg Hx     OBJCTIVE -PE, EKG, labs   Wt Readings from Last 3 Encounters:  07/22/22 123 lb 3.2 oz (55.9 kg)  04/09/22 125 lb (56.7 kg)  01/18/22 122 lb 6.4 oz (55.5 kg)    Physical Exam: BP 138/62 (BP Location: Left Arm, Patient Position: Sitting, Cuff Size: Normal)   Pulse 82   Ht 5\' 2"  (1.575 m)   Wt 123 lb 3.2 oz (55.9 kg)   SpO2 98%   BMI 22.53 kg/m  Physical Exam Vitals reviewed.  Constitutional:      General: She is not in acute distress.    Appearance: Normal appearance. She is normal weight. She is not ill-appearing or toxic-appearing.  HENT:     Head: Normocephalic and atraumatic.  Neck:     Vascular: No carotid bruit or JVD.  Cardiovascular:     Rate and Rhythm: Normal rate and regular rhythm. No extrasystoles are present.    Chest Wall: PMI is not displaced.     Pulses: Normal pulses.     Heart sounds: S1 normal and S2 normal. No midsystolic click and no opening snap (Unable to auscultate.). No murmur (Unable to hear mitral murmur either systolic or  diastolic) heard.    No friction rub. No gallop.  Musculoskeletal:     Cervical back: Normal range of motion and neck supple.  Neurological:     Mental Status: She is alert.     Adult ECG Report  Rate: 82 ;  Rhythm: normal sinus rhythm and normal axis, intervals durations.  Borderline low voltage. ;   Narrative Interpretation: Stable/normal  Recent Labs: Reviewed Lab Results  Component Value Date   CHOL 144 01/08/2022   HDL 58 01/08/2022   LDLCALC 81 01/08/2022   TRIG 25 01/08/2022   CHOLHDL 2.5 01/08/2022   Lab Results  Component Value Date   CREATININE 0.84 01/29/2022   BUN 19 01/29/2022   NA 139 01/29/2022   K 5.1 01/29/2022   CL 102 01/29/2022   CO2 23 01/29/2022      Latest Ref Rng & Units 01/09/2022    5:03 AM 01/08/2022    4:25 AM 01/07/2022   10:34 PM  CBC  WBC 4.0 - 10.5 K/uL 6.2  10.8  10.2   Hemoglobin 12.0 - 15.0 g/dL 03/09/2022  54.2  70.6   Hematocrit 36.0 - 46.0 % 38.8  37.9  41.1   Platelets 150 - 400 K/uL 212  261  245     Lab Results  Component Value Date   HGBA1C 5.3 01/08/2022   Lab Results  Component Value Date   TSH 0.275 (L) 04/05/2020    ================================================== I spent a total of 29 minutes with the patient spent in direct patient consultation.  Additional time spent with chart review  / charting (studies, outside notes, etc): 16 min Total Time: 45 min  Current medicines are reviewed at length with the patient today.  (+/- concerns) none  Notice: This dictation was prepared with Dragon dictation along with smart phrase technology. Any transcriptional errors that result from this process are unintentional and may not be corrected upon review.   Studies Ordered:  Orders Placed This Encounter  Procedures   EKG 12-Lead   No orders of the defined types were placed in this encounter.   Patient Instructions / Medication Changes & Studies & Tests Ordered   Patient Instructions  Medication Instructions:   Stop  taking Spironolactone  *If you need a refill on your cardiac medications before your next appointment, please call your pharmacy*   Lab Work: Not needed    Testing/Procedures: Not needed   Follow-Up: At [facility], you and your health needs are our priority.  As part of our continuing mission to provide you with exceptional heart care, we have created designated Provider Care Teams.  These Care Teams include your primary Cardiologist (physician) and Advanced Practice Providers (APPs -  Physician Assistants and Nurse Practitioners) who all work together to provide you with the care you need, when you need it.     Your next appointment:   6 month(s)  The format for your next appointment:   In Person  Provider:   Bryan Lemma, MD       Marykay Lex, MD, MS Bryan Lemma, M.D., M.S. Interventional Cardiologist  [facility]  Pager # [phone] Phone # [phone] [address] [place], [place] [address]   Thank you for choosing [facility] [place]!!

## 2022-07-24 ENCOUNTER — Encounter: Payer: Medicare PPO | Admitting: Physical Therapy

## 2022-07-29 ENCOUNTER — Encounter: Payer: Medicare PPO | Admitting: Physical Therapy

## 2022-08-05 ENCOUNTER — Encounter: Payer: Self-pay | Admitting: Podiatry

## 2022-08-05 ENCOUNTER — Ambulatory Visit: Payer: Medicare PPO | Admitting: Podiatry

## 2022-08-05 DIAGNOSIS — L600 Ingrowing nail: Secondary | ICD-10-CM | POA: Diagnosis not present

## 2022-08-05 DIAGNOSIS — L03032 Cellulitis of left toe: Secondary | ICD-10-CM

## 2022-08-05 NOTE — Patient Instructions (Signed)

## 2022-08-06 ENCOUNTER — Encounter: Payer: Medicare PPO | Admitting: Physical Therapy

## 2022-08-06 NOTE — Progress Notes (Signed)
Subjective:   Patient ID: [name] Trujillo, female   DOB: 81 y.o.   MRN: [id number]   HPI Patient presents with a red irritated toenail left big toe lateral side that is been going on now for around 10 days.  Patient states that she had back surgery around 6 weeks ago was doing well but wants to make sure she does not have infection and did take a round of antibiotics for 7 days.  Patient does not smoke likes to be active   Review of Systems  All other systems reviewed and are negative.       Objective:  Physical Exam Vitals and nursing note reviewed.  Constitutional:      Appearance: She is well-developed.  Pulmonary:     Effort: Pulmonary effort is normal.  Musculoskeletal:        General: Normal range of motion.  Skin:    General: Skin is warm.  Neurological:     Mental Status: She is alert.     Neurovascular status intact muscle strength was found to be adequate range of motion adequate redness and discomfort on the distal lateral aspect of the left hallux nail lateral side sore when pressed with no proximal edema erythema or drainage noted.  Good digital perfusion well-oriented     Assessment:  Combination of paronychia infection left hallux lateral side with probability for chronic ingrown toenail     Plan:  H&P educated her on this and infection process.  At this point we will get a take an aggressive conservative approach and I anesthetized the left hallux 60 mg like Marcaine mixture sterile prep done and using sterile instrumentation remove the lateral border under sterile condition cleaned out necrotic tissue flushed the area applied sterile dressing instructed on soaks and reappoint as needed and may ultimately require permanent procedure which I made her aware of today

## 2022-08-08 ENCOUNTER — Encounter: Payer: Medicare PPO | Admitting: Physical Therapy

## 2022-08-12 ENCOUNTER — Encounter: Payer: Medicare PPO | Admitting: Physical Therapy

## 2022-08-15 ENCOUNTER — Encounter: Payer: Medicare PPO | Admitting: Physical Therapy

## 2022-08-18 ENCOUNTER — Encounter: Payer: Self-pay | Admitting: Cardiology

## 2022-08-18 NOTE — Assessment & Plan Note (Signed)
LDL 81 on low-dose rosuvastatin.  Continue current dose.  No change.

## 2022-08-18 NOTE — Assessment & Plan Note (Addendum)
Clarification, it was not a non-STEMI -> ACS.  The True Diagnosis Will Be Type II Myocardial Infarction - in the Vernacular - Demand Ischemia-Infarction.  She had an acute Takotsubo Syndrome which is not ACS.  She had acute onset chest pain which has been really symptoms consistent with ACS in the setting stressors of having fallen and injuring his head and face.  Ischemic evaluation was negative with normal coronaries by catheterization.  Wall motion on echo was consistent with Takotsubo, and the EF/wall motion abnormalities have completely resolved.  She now remains on low-dose losartan and Toprol. With no evidence of CAD, she is not on aspirin

## 2022-08-18 NOTE — Assessment & Plan Note (Signed)
As noted, her hospital visitation was actually not an ACS.  Was an Acute Takotsubo Cardiomyopathy syndrome.  She had significant anginal symptoms but also had to be reduced EF and CHF at that time.  Has been covered remarkably, her follow-up echocardiogram was completely normal with exception of some mild mitral valve disease.  No wall motion O'Malley and reduced EF actively resolved.  Plan: TID has not been taking spironolactone, we will simply just have her discontinue it altogether.  Open it was restarted postop, but apparently she had not been taking it preop) Continue low-dose Toprol and low-dose losartan cardioprotection Euvolemic, does not require diuretic.

## 2022-08-18 NOTE — Assessment & Plan Note (Signed)
Her murmur is somewhat difficult to hear.  I do not necessarily hear today but does a lot of talking in the background.  She has a myxomatous mitral valve with mild MVP and mild MS.  Will probably evaluate with echocardiogram in a couple years.

## 2022-08-19 ENCOUNTER — Encounter: Payer: Medicare PPO | Admitting: Physical Therapy

## 2022-08-22 ENCOUNTER — Encounter: Payer: Medicare PPO | Admitting: Physical Therapy

## 2022-08-22 NOTE — Therapy (Signed)
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   She had ACUTE TAKOTSUBO (STRESS) CARDIOMYOPATHY with elevated Troponin Levels - this would be considered "Demand Ischemia - Demand Infarction" & NOT associated with ACS/CAD.     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  - Cath with NO CAD. ==> RESOLVED: f/u Echo 03/2022: EF 60-65%.   Past Surgical History:  Procedure Laterality Date   APPENDECTOMY     5-18 yo   BACK SURGERY     Age 89   CESAREAN SECTION     ECTOPIC PREGNANCY SURGERY     LAPAROSCOPIC HYSTERECTOMY     LEFT HEART CATH AND CORONARY ANGIOGRAPHY N/A 01/09/2022   Procedure: LEFT HEART CATH AND CORONARY ANGIOGRAPHY;  Surgeon: Tonny Bollman, MD;  Location: [facility] INVASIVE CV LAB;  Service: CV:: Widely patent coronaries with mild nonobstructive LAD plaquing.  Right dominant system.  Normal LVEDP.  Based on clinical presentation, findings are consistent with acute Takotsubo Cardiomyopathy Syndrome   POSTERIOR FUSION LUMBAR SPINE  05/24/2022   [facility], [place]; [name], MD): L2-L3 XLIF, L2-L3 POSTERIOR DECOMPRESSION AND FUSION    TRANSTHORACIC ECHOCARDIOGRAM  01/08/2022   Severely decreased LV function-EF 25 to 30%.  Mid to apical (mostly anterior) with normal basal motion.  GR 2 DD-elevated LAP.  Mildly dilated LA.  Aortic sclerosis with no stenosis.  No AI.  Normal MV with mild to moderate TR.  Mildly elevated RAP, and PAP (estimated 49 mg). If LAD CAD ruled out - consistent with Takotsubo CM Syndrome.   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    PCP: Mosetta Putt, MD   REFERRING PROVIDER: Rondel Oh, NP for Marlaine Hind, MD   REFERRING DIAG: Z98.1 (ICD-10-CM) - S/P lumbar spinal fusion   THERAPY DIAG:  Radiculopathy, lumbar region  Muscle weakness (generalized)  Other abnormalities of gait and mobility  Unsteadiness on feet  RATIONALE FOR EVALUATION AND TREATMENT: Rehabilitation  ONSET DATE: 05/24/2022 - L2-3 XLIF/PSF   NEXT MD VISIT: 10/16/22   SUBJECTIVE:  SUBJECTIVE STATEMENT: Pt reports she worked with [facility] PT from POD #10 through late October and has been continuing with  her walking program and a few simple HEP since. Walking has been hindered by an infection in her toe. Still in TLSO when OOB but can sleep without the brace. She has a post-op x-ray pending to determine id she will need to continue  with the TLSO and back precautions.  PAIN: Are you having pain? No  PERTINENT HISTORY:  L2-3 XLIF/PSF 05/24/22; C5-7 ACDF 09/21/20; idiopathic progressive neuropathy; hypthyroidism; DDD; remote h/o back surgery at age 81; h/o scoliosis; Takotsubo cardiomyopathy 01/10/22   PRECAUTIONS: Back - No BLT until MD reviews pending x-rays  WEIGHT BEARING RESTRICTIONS: No  FALLS:  Has patient fallen in last 6 months? No  LIVING ENVIRONMENT: Lives with: lives with their spouse Lives in: House/apartment Stairs: Yes: External: 5-6 steps; on left going up Has following equipment at home: Walker - 2 wheeled, shower chair, and hiking/walking poles  OCCUPATION: Retired Runner, broadcasting/film/videoteacher  PLOF: Independent and Leisure: walking daily for total of ~1.5 miles; working in the yard; sewing/quilting    PATIENT GOALS: "Be able to move and do things in the house and yard w/o worrying about falling."   OBJECTIVE:   DIAGNOSTIC FINDINGS:  Post-op f/u pending currently  07/01/22 - Lumbar spine x-ray: Expected postoperative appearance related to L2-L3 fixation.   06/12/21 - Lumbar MRI: Postoperative and degenerative changes of the lumbar spine as described, similar compared to July 28, 2020. At L2-L3, there is moderate spinal canal stenosis due to disc bulging, endplate osteophytic spurring, facet and ligamentum flavum hypertrophy. Milder spinal canal stenosis is seen at L3-L4. There is multilevel neural foraminal stenosis.   PATIENT SURVEYS:  Modified Oswestry 22 / 50 = 44.0%   SCREENING FOR RED FLAGS: Bowel or bladder incontinence: Yes: bladder - takes Vesicare Spinal tumors: No Cauda equina syndrome: No Compression fracture: No Abdominal aneurysm: No  COGNITION:  Overall cognitive status: Within functional limits for tasks assessed    SENSATION: WFL - Pt reports LE sensation has improved since surgery  MUSCLE LENGTH: NT  POSTURE:  rounded shoulders, forward head, and right pelvic  obliquity  PALPATION: NT  LUMBAR ROM: Deferred pending MD clearance to release her from post-surgical precautions   Active  A/PROM  eval  Flexion   Extension   Right lateral flexion   Left lateral flexion   Right rotation   Left rotation   (Blank rows = not tested)  LOWER EXTREMITY ROM:    Grossly WFL  LOWER EXTREMITY MMT:  (tested in sitting on eval)  MMT Right eval Left eval  Hip flexion 4 4  Hip extension 4- 4-  Hip abduction 4+ 4+  Hip adduction 4+ 4+  Hip internal rotation 4- 4-  Hip external rotation 3+ 3+  Knee flexion 4+ 4+  Knee extension 4+ 4+  Ankle dorsiflexion 3- 3-  Ankle plantarflexion 4- NWB 4- NWB  Ankle inversion 3- 2-  Ankle eversion 4- 3+   (Blank rows = not tested)  LUMBAR SPECIAL TESTS:  NT  FUNCTIONAL TESTS:  5 times sit to stand: 22.19 sec Timed up and go (TUG): TBA 10 meter walk test: TBA Berg Balance Scale: 27/56; < 36 high risk for falls (close to 100%)  Functional gait assessment: TBA Left hip Trendelenburg evident in right SLS  GAIT: Distance walked: 60 Assistive device utilized:  single walking/hiking pole on R Level of assistance: SBA Gait pattern:  increased sway, step  through pattern, decreased stride length, trendelenburg, and lateral hip instability Comments: L>R foot slap   TODAY'S TREATMENT:   08/26/22 Eval only   PATIENT EDUCATION:  Education details: PT eval findings, anticipated POC, and need for further assessment of dynamic balance while walking Person educated: Patient Education method: Explanation Education comprehension: verbalized understanding  HOME EXERCISE PROGRAM: TBD   ASSESSMENT:  CLINICAL IMPRESSION: [name] Trujillo is a 81 y.o. female  who was seen today for physical therapy evaluation and treatment s/p L2-3 XLIF/PSF on 05/24/22.  She reports she completed a home health therapy episode while standing with her daughter initially postoperatively and has continued with a walking program and  HEP for the last month and a half.  She is awaiting a follow-up postop lumbar x-ray to determine how well her fusion is healing, and remains in her TLSO with BLT back precautions.  She reports minimal to no back or lower extremity radicular pain and notes improvement in her lower extremity sensation since the fusion, but continues to experience L>R lower extremity weakness and unsteady gait.  Current deficits include restricted lumbar ROM, bilateral LE weakness with impaired coordination, balance deficits and gait instability with high fall risk per standardized balance testing Sharlene Motts = 27/56).  Further assessment of dynamic gait balance will be completed next visit but anticipate scores will also reflect high fall risk.  [name] Trujillo will benefit from skilled PT to address above deficits to improve mobility and activity tolerance with decreased pain interference and improved balance to reduce fall risk.  OBJECTIVE IMPAIRMENTS: Abnormal gait, decreased activity tolerance, decreased balance, decreased coordination, decreased endurance, decreased mobility, difficulty walking, decreased ROM, decreased strength, decreased safety awareness, impaired perceived functional ability, impaired flexibility, improper body mechanics, postural dysfunction, and pain.   ACTIVITY LIMITATIONS: carrying, lifting, bending, sitting, standing, squatting, sleeping, stairs, transfers, bed mobility, bathing, toileting, dressing, reach over head, locomotion level, and caring for others  PARTICIPATION LIMITATIONS: meal prep, cleaning, laundry, interpersonal relationship, driving, shopping, community activity, yard work, and church  PERSONAL FACTORS: Age, Past/current experiences, Time since onset of injury/illness/exacerbation, and 3+ comorbidities: C5-7 ACDF 09/21/20; idiopathic progressive neuropathy; hypthyroidism; DDD; remote h/o back surgery at age 34; h/o scoliosis; Takotsubo cardiomyopathy 01/10/22   are also affecting patient's  functional outcome.   REHAB POTENTIAL: Good  CLINICAL DECISION MAKING: Evolving/moderate complexity  EVALUATION COMPLEXITY: Moderate   GOALS: Goals reviewed with patient? Yes  SHORT TERM GOALS: Target date: 09/23/2022  Patient will be independent with initial HEP to improve outcomes and carryover.  Baseline:  Goal status: INITIAL  2.  Complete balance assessment and update LTGs as indicated.  Baseline:  Goal status: INITIAL  LONG TERM GOALS: Target date: 10/21/2022  Patient will be independent with ongoing/advanced HEP for self-management at home.  Baseline:  Goal status: INITIAL  2.  Patient to demonstrate ability to achieve and maintain good spinal alignment/posturing and body mechanics needed for daily activities. Baseline:  Goal status: INITIAL  3.  Patient will demonstrate functional pain free lumbar ROM to perform ADLs.   Baseline:  Goal status: INITIAL  4.  Patient will demonstrate improved B proximal LE strength to >/= 4 to 4+/5 for improved stability and ease of mobility. Baseline:  Goal status: INITIAL  5  Patient will demonstrate improved B ankle strength to >/= 3+ to 4-/5 for improved gait stability. Baseline:  Goal status: INITIAL  6.  Patient will ambulate with improved gait pattern with decreased B foot drop/slap and no evidence of instability/LOB. Baseline:  Goal status:  INITIAL   7. Patient will report </= 32% on Modified Oswestry to demonstrate improved functional ability with decreased pain interference. Baseline: 22 / 50 = 44.0 % Goal status: INITIAL  8.  Patient will improve Berg score to >/= 36/56 to improve safety and stability with ADLs in standing and reduce risk for falls. Baseline: 27/56 Goal status: INITIAL  9.  Patient will improve FGA score to >/= 19/30 to improve gait stability and reduce risk for falls. Baseline: TBD Goal status: INITIAL    PLAN:  PT FREQUENCY: 2x/week  PT DURATION: 8 weeks  PLANNED INTERVENTIONS:  Therapeutic exercises, Therapeutic activity, Neuromuscular re-education, Balance training, Gait training, Patient/Family education, Self Care, Joint mobilization, DME instructions, Aquatic Therapy, Dry Needling, Electrical stimulation, Cryotherapy, Moist heat, scar mobilization, Taping, Ultrasound, Ionotophoresis 4mg /ml Dexamethasone, Manual therapy, and Re-evaluation  PLAN FOR NEXT SESSION: Complete dynamic gait/balance assessment - , TUG and DGI vs FGA; review HHPT HEP and update/modify as indicated; lower extremity strengthening; core stabilization; balance training and gait stability   , PT 08/26/2022, 1:40 PM

## 2022-08-26 ENCOUNTER — Ambulatory Visit: Payer: Medicare PPO | Attending: Registered" | Admitting: Physical Therapy

## 2022-08-26 ENCOUNTER — Other Ambulatory Visit: Payer: Self-pay

## 2022-08-26 ENCOUNTER — Encounter: Payer: Self-pay | Admitting: Physical Therapy

## 2022-08-26 ENCOUNTER — Encounter: Payer: Self-pay | Admitting: Nurse Practitioner

## 2022-08-26 DIAGNOSIS — M6281 Muscle weakness (generalized): Secondary | ICD-10-CM | POA: Diagnosis present

## 2022-08-26 DIAGNOSIS — R2689 Other abnormalities of gait and mobility: Secondary | ICD-10-CM | POA: Diagnosis present

## 2022-08-26 DIAGNOSIS — M5416 Radiculopathy, lumbar region: Secondary | ICD-10-CM | POA: Diagnosis present

## 2022-08-26 DIAGNOSIS — R2681 Unsteadiness on feet: Secondary | ICD-10-CM | POA: Insufficient documentation

## 2022-08-27 ENCOUNTER — Other Ambulatory Visit: Payer: Self-pay | Admitting: Family Medicine

## 2022-08-27 ENCOUNTER — Ambulatory Visit
Admission: RE | Admit: 2022-08-27 | Discharge: 2022-08-27 | Disposition: A | Discharge status: Home / Self Care | Payer: Medicare PPO | Source: Ambulatory Visit | Attending: Family Medicine | Admitting: Family Medicine

## 2022-08-27 ENCOUNTER — Ambulatory Visit
Admission: RE | Admit: 2022-08-27 | Discharge: 2022-08-27 | Disposition: A | Discharge status: Home / Self Care | Payer: Self-pay | Source: Ambulatory Visit

## 2022-08-27 DIAGNOSIS — M5416 Radiculopathy, lumbar region: Secondary | ICD-10-CM

## 2022-08-28 ENCOUNTER — Ambulatory Visit: Payer: Medicare PPO | Admitting: Physical Therapy

## 2022-08-28 ENCOUNTER — Encounter: Payer: Self-pay | Admitting: Physical Therapy

## 2022-08-28 DIAGNOSIS — M6281 Muscle weakness (generalized): Secondary | ICD-10-CM

## 2022-08-28 DIAGNOSIS — R2689 Other abnormalities of gait and mobility: Secondary | ICD-10-CM

## 2022-08-28 DIAGNOSIS — M5416 Radiculopathy, lumbar region: Secondary | ICD-10-CM

## 2022-08-28 DIAGNOSIS — R2681 Unsteadiness on feet: Secondary | ICD-10-CM

## 2022-08-28 NOTE — Therapy (Signed)
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   She had ACUTE TAKOTSUBO (STRESS) CARDIOMYOPATHY with elevated Troponin Levels - this would be considered "Demand Ischemia - Demand Infarction" & NOT associated with ACS/CAD.     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  - Cath with NO CAD. ==> RESOLVED: f/u Echo 03/2022: EF 60-65%.   Past Surgical History:  Procedure Laterality Date   APPENDECTOMY     39-18 yo   BACK SURGERY     Age 85   CESAREAN SECTION     ECTOPIC PREGNANCY SURGERY     LAPAROSCOPIC HYSTERECTOMY     LEFT HEART CATH AND CORONARY ANGIOGRAPHY N/A 01/09/2022   Procedure: LEFT HEART CATH AND CORONARY ANGIOGRAPHY;  Surgeon: Tonny Bollman, MD;  Location: [facility];  Service: CV:: Widely patent coronaries with mild nonobstructive LAD plaquing.  Right dominant system.  Normal LVEDP.  Based on clinical presentation, findings are consistent with acute Takotsubo Cardiomyopathy Syndrome   POSTERIOR FUSION LUMBAR SPINE  05/24/2022   [facility] [place]; [name], MD): L2-L3 XLIF, L2-L3 POSTERIOR  DECOMPRESSION AND FUSION   TRANSTHORACIC ECHOCARDIOGRAM  01/08/2022   Severely decreased LV function-EF 25 to 30%.  Mid to apical (mostly anterior) with normal basal motion.  GR 2 DD-elevated LAP.  Mildly dilated LA.  Aortic sclerosis with no stenosis.  No AI.  Normal MV with mild to moderate TR.  Mildly elevated RAP, and PAP (estimated 49 mg). If LAD CAD ruled out - consistent with Takotsubo CM Syndrome.   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    PCP: Mosetta Putt, MD   REFERRING PROVIDER: Rondel Oh, NP for Marlaine Hind, MD   REFERRING DIAG: Z98.1 (ICD-10-CM) - S/P lumbar spinal fusion   THERAPY DIAG:  Radiculopathy, lumbar region  Muscle weakness (generalized)  Other abnormalities of gait and mobility  Unsteadiness on feet  RATIONALE FOR EVALUATION AND TREATMENT: Rehabilitation  ONSET DATE: 05/24/2022 - L2-3 XLIF/PSF   NEXT MD VISIT: 10/16/22  SUBJECTIVE:                                                                                                                                                                                                         SUBJECTIVE STATEMENT: Patient able to resume wearing athletic shoes today after having to wear sandals for several weeks due to ingrown R great toe toenail.  She thinks this will help with her balance.  PAIN: Are you having pain? No  PERTINENT HISTORY:  L2-3 XLIF/PSF 05/24/22; C5-7 ACDF 09/21/20; idiopathic  progressive neuropathy; hypthyroidism; DDD; remote h/o back surgery at age 89; h/o scoliosis; Takotsubo cardiomyopathy 01/10/22   PRECAUTIONS: Back - No BLT until MD reviews pending x-rays  WEIGHT BEARING RESTRICTIONS: No  FALLS:  Has patient fallen in last 6 months? No  LIVING ENVIRONMENT: Lives with: lives with their spouse Lives in: House/apartment Stairs: Yes: External: 5-6 steps; on left going up Has following equipment at home: Walker - 2 wheeled, shower chair, and hiking/walking poles  OCCUPATION: Retired Runner, broadcasting/film/videoteacher  PLOF: Independent and Leisure: walking daily for total of ~1.5 miles; working in the yard; sewing/quilting    PATIENT GOALS: "Be able to move and do things in the house and yard w/o worrying about falling."   OBJECTIVE:   DIAGNOSTIC FINDINGS:  08/27/22 - Lumbar spine x-ray completed as part of postop series - results pending   07/01/22 - Lumbar spine x-ray: Expected postoperative appearance related to L2-L3 fixation.   06/12/21 - Lumbar MRI: Postoperative and degenerative changes of the lumbar spine as described, similar compared to July 28, 2020. At L2-L3, there is moderate spinal canal stenosis due to disc bulging, endplate osteophytic spurring, facet and ligamentum flavum hypertrophy. Milder spinal canal stenosis is seen at L3-L4. There is multilevel neural foraminal stenosis.   PATIENT SURVEYS:  Modified Oswestry 22 / 50 = 44.0%   SCREENING FOR RED FLAGS: Bowel or bladder incontinence: Yes: bladder - takes Vesicare Spinal tumors: No Cauda equina syndrome: No Compression fracture: No Abdominal aneurysm: No  COGNITION:  Overall cognitive status: Within functional limits for tasks assessed    SENSATION: WFL - Pt reports LE sensation has improved since surgery  MUSCLE LENGTH: NT  POSTURE:  rounded shoulders, forward head, and right pelvic obliquity  PALPATION: NT  LUMBAR ROM: Deferred pending MD clearance to release her from post-surgical  precautions   Active  A/PROM  eval  Flexion   Extension   Right lateral flexion   Left lateral flexion   Right rotation   Left rotation   (Blank rows =  not tested)  LOWER EXTREMITY ROM:    Grossly WFL  LOWER EXTREMITY MMT:  (tested in sitting on eval)  MMT Right eval Left eval  Hip flexion 4 4  Hip extension 4- 4-  Hip abduction 4+ 4+  Hip adduction 4+ 4+  Hip internal rotation 4- 4-  Hip external rotation 3+ 3+  Knee flexion 4+ 4+  Knee extension 4+ 4+  Ankle dorsiflexion 3- 3-  Ankle plantarflexion 4- NWB 4- NWB  Ankle inversion 3- 2-  Ankle eversion 4- 3+   (Blank rows = not tested)  LUMBAR SPECIAL TESTS:  NT  FUNCTIONAL TESTS:  5 times sit to stand: 22.19 sec Timed up and go (TUG): 14.84 sec with hiking pole (08/28/22) 10 meter walk test: 15.47 sec with hiking pole; Gait speed = 2.12 ft/sec (08/28/22) Berg Balance Scale: 27/56; < 36 high risk for falls (close to 100%)  Functional gait assessment: 8/30; < 19 = high risk fall (08/28/22) Left hip Trendelenburg evident in right SLS  GAIT: Distance walked: 60 Assistive device utilized:  single walking/hiking pole on R Level of assistance: SBA Gait pattern:  increased sway, step through pattern, decreased stride length, trendelenburg, and lateral hip instability Comments: L>R foot slap   TODAY'S TREATMENT:   08/28/22 THERAPEUTIC ACTIVITIES: TUG: 14.84 sec with hiking pole : 15.47 sec with hiking (completed with hiking pole as AD) Gait speed = 2.12 ft/sec with hiking pole FGA: 8/30  (hiking pole used during testing)  THERAPEUTIC EXERCISE: to improve flexibility, strength and mobility.  Verbal and tactile cues throughout for technique. Seated R 4-way ankle with RTB x 10 Seated L ankle DF, IV and EV AROM x 10 Seated B heel/toe raises x 20 SLS + opp LE hip ABD isometric into ball against counter 10 x 5" bil   08/26/22 Eval only   PATIENT EDUCATION:  Education details: PT eval findings,  anticipated POC, and need for further assessment of dynamic balance while walking Person educated: Patient Education method: Explanation Education comprehension: verbalized understanding  HOME EXERCISE PROGRAM: Access Code: [email or web address] URL: [email or web address] Date: 08/28/2022 Prepared by: Glenetta Hew  Exercises - Seated Heel Toe Raises  - 1 x daily - 7 x weekly - 2 sets - 10 reps - 3 sec hold - Standing Isometric Hip Abduction with Ball on Wall  - 1 x daily - 7 x weekly - 2 sets - 10 reps - 3 sec hold   ASSESSMENT:  CLINICAL IMPRESSION: Gait and dynamic balance testing completed today with results showing significant decline from patient's prior level of function when last seen preoperatively as of June of this year.  FGA in particular showing very high risk for falls with score of 8/30.  Strengthening and AROM exercises initiated today addressing proximal and distal lower extremity weakness.  Patient noting feeling of better control in ankles than she had had previously before surgery but left ankle still unable to any resistance with exercises.  Most recent postop x-rays completed yesterday with results still pending - will await MD response in regards to weaning from TLSO and back precautions.  OBJECTIVE IMPAIRMENTS: Abnormal gait, decreased activity tolerance, decreased balance, decreased coordination, decreased endurance, decreased mobility, difficulty walking, decreased ROM, decreased strength, decreased safety awareness, impaired perceived functional ability, impaired flexibility, improper body mechanics, postural dysfunction, and pain.   ACTIVITY LIMITATIONS: carrying, lifting, bending, sitting, standing, squatting, sleeping, stairs, transfers, bed mobility, bathing, toileting, dressing, reach over head, locomotion level, and caring for others  PARTICIPATION LIMITATIONS: meal prep, cleaning,  laundry, interpersonal relationship, driving, shopping, community activity,  yard work, and church  PERSONAL FACTORS: Age, Past/current experiences, Time since onset of injury/illness/exacerbation, and 3+ comorbidities: C5-7 ACDF 09/21/20; idiopathic progressive neuropathy; hypthyroidism; DDD; remote h/o back surgery at age 77; h/o scoliosis; Takotsubo cardiomyopathy 01/10/22   are also affecting patient's functional outcome.   REHAB POTENTIAL: Good  CLINICAL DECISION MAKING: Evolving/moderate complexity  EVALUATION COMPLEXITY: Moderate   GOALS: Goals reviewed with patient? Yes  SHORT TERM GOALS: Target date: 09/23/2022  Patient will be independent with initial HEP to improve outcomes and carryover.  Baseline:  Goal status: IN PROGRESS  2.  Complete balance assessment and update LTGs as indicated.  Baseline:  Goal status: IN PROGRESS  LONG TERM GOALS: Target date: 10/21/2022  Patient will be independent with ongoing/advanced HEP for self-management at home.  Baseline:  Goal status: IN PROGRESS  2.  Patient to demonstrate ability to achieve and maintain good spinal alignment/posturing and body mechanics needed for daily activities. Baseline:  Goal status: IN PROGRESS  3.  Patient will demonstrate functional pain free lumbar ROM to perform ADLs.   Baseline:  Goal status: IN PROGRESS  4.  Patient will demonstrate improved B proximal LE strength to >/= 4 to 4+/5 for improved stability and ease of mobility. Baseline:  Goal status: IN PROGRESS  5  Patient will demonstrate improved B ankle strength to >/= 3+ to 4-/5 for improved gait stability. Baseline:  Goal status: IN PROGRESS  6.  Patient will ambulate with improved gait pattern with decreased B foot drop/slap and no evidence of instability/LOB. Baseline:  Goal status: IN PROGRESS   7. Patient will report </= 32% on Modified Oswestry to demonstrate improved functional ability with decreased pain interference. Baseline: 22 / 50 = 44.0 % Goal status: IN PROGRESS  8.  Patient will improve Berg score  to >/= 36/56 to improve safety and stability with ADLs in standing and reduce risk for falls. Baseline: 27/56 Goal status: IN PROGRESS  9.  Patient will improve FGA score to >/= 19/30 to improve gait stability and reduce risk for falls. Baseline: 8/30 Goal status: IN PROGRESS    PLAN:  PT FREQUENCY: 2x/week  PT DURATION: 8 weeks  PLANNED INTERVENTIONS: Therapeutic exercises, Therapeutic activity, Neuromuscular re-education, Balance training, Gait training, Patient/Family education, Self Care, Joint mobilization, DME instructions, Aquatic Therapy, Dry Needling, Electrical stimulation, Cryotherapy, Moist heat, scar mobilization, Taping, Ultrasound, Ionotophoresis 4mg /ml Dexamethasone, Manual therapy, and Re-evaluation - [id number] (E-stim) not approved per [facility] [place] prior authorization  PLAN FOR NEXT SESSION: lower extremity strengthening; core stabilization; balance training and gait stability   [facility], PT 08/28/2022, 3:15 PM

## 2022-08-29 ENCOUNTER — Encounter: Payer: Self-pay | Admitting: Neurological Surgery

## 2022-09-03 ENCOUNTER — Encounter: Payer: Self-pay | Admitting: Physical Therapy

## 2022-09-03 ENCOUNTER — Ambulatory Visit: Payer: Medicare PPO | Admitting: Physical Therapy

## 2022-09-03 DIAGNOSIS — M5416 Radiculopathy, lumbar region: Secondary | ICD-10-CM

## 2022-09-03 DIAGNOSIS — R2681 Unsteadiness on feet: Secondary | ICD-10-CM

## 2022-09-03 DIAGNOSIS — M6281 Muscle weakness (generalized): Secondary | ICD-10-CM

## 2022-09-03 DIAGNOSIS — R2689 Other abnormalities of gait and mobility: Secondary | ICD-10-CM

## 2022-09-03 NOTE — Therapy (Signed)
OUTPATIENT PHYSICAL THERAPY TREATMENT   Patient Name: [name] Trujillo MRN: [id number] DOB:1941-06-25, 81 y.o., female Today's Date: 09/03/2022  END OF SESSION:  PT End of Session - 09/03/22 1016     Visit Number 3    Date for PT Re-Evaluation 10/21/22    Authorization Type [employer]    Authorization Time Period 08/26/22 - 10/21/22    Authorization - Visit Number 3    Authorization - Number of Visits 10    PT Start Time 1016    PT Stop Time 1058    PT Time Calculation (min) 42 min    Activity Tolerance Patient tolerated treatment well    Behavior During Therapy [facility] for tasks assessed/performed              Past Medical History:  Diagnosis Date   High cholesterol    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   She had ACUTE TAKOTSUBO (STRESS) CARDIOMYOPATHY with elevated Troponin Levels - this would be considered "Demand Ischemia - Demand Infarction" & NOT associated with ACS/CAD.     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  - Cath with NO CAD. ==> RESOLVED: f/u Echo 03/2022: EF 60-65%.   Past Surgical History:  Procedure Laterality Date   APPEN[age]CTOMY     17-18 yo   BACK SURGERY     Age 74   CESAREAN SECTION     ECTOPIC PREGNANCY SURGERY     LAPAROSCOPIC HYSTERECTOMY     LEFT HEART CATH AND CORONARY ANGIOGRAPHY N/A 01/09/2022   Procedure: LEFT HEART CATH AND CORONARY ANGIOGRAPHY;  Surgeon: Sherren Mocha, MD;  Location: [facility];  Service: CV:: Widely patent coronaries with mild nonobstructive LAD plaquing.  Right dominant system.  Normal LVEDP.  Based on clinical presentation, findings are consistent with acute Takotsubo Cardiomyopathy Syndrome   POSTERIOR  FUSION LUMBAR SPINE  05/24/2022   ([place]; [name], MD): L2-L3 XLIF, L2-L3 POSTERIOR DECOMPRESSION AND FUSION   TRANSTHORACIC ECHOCARDIOGRAM  01/08/2022   Severely decreased LV function-EF 25 to 30%.  Mid to apical (mostly anterior) with normal basal motion.  GR 2 DD-elevated LAP.  Mildly dilated LA.  Aortic sclerosis with no stenosis.  No AI.  Normal MV with mild to moderate TR.  Mildly elevated RAP, and PAP (estimated 49 mg). If LAD CAD ruled out - consistent with Takotsubo CM Syndrome.   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    PCP: Derinda Late, MD   REFERRING PROVIDER: Dorena Dew, NP for Hermenia Fiscal, MD   REFERRING DIAG: Z98.1 (ICD-10-CM) - S/P lumbar spinal fusion   THERAPY DIAG:  Radiculopathy, lumbar region  Muscle weakness (generalized)  Other abnormalities of gait  and mobility  Unsteadiness on feet  RATIONALE FOR EVALUATION AND TREATMENT: Rehabilitation  ONSET DATE: 05/24/2022 - L2-3 XLIF/PSF   NEXT MD VISIT: 10/16/22   SUBJECTIVE:                                                                                                                                                                                                         SUBJECTIVE STATEMENT: Patient reports she really felt her muscles following the HEP - had to take Tylenol but no pain now. No f/u received re: recent post-op imaging and clearance to wean from from  brace.  PAIN: Are you having pain? No  PERTINENT HISTORY:  L2-3 XLIF/PSF 05/24/22; C5-7 ACDF 09/21/20; idiopathic progressive neuropathy; hypthyroidism; DDD; remote h/o back surgery at age 31; h/o scoliosis; Takotsubo cardiomyopathy 01/10/22   PRECAUTIONS: Back - No BLT until MD reviews pending x-rays  WEIGHT BEARING RESTRICTIONS: No  FALLS:  Has patient fallen in last 6 months? No  LIVING ENVIRONMENT: Lives with: lives with their spouse Lives in: House/apartment Stairs: Yes: External: 5-6 steps; on left going up Has following equipment at home: Walker - 2 wheeled, shower chair, and hiking/walking poles  OCCUPATION: Retired Pharmacist, hospital  PLOF: Independent and Leisure: walking daily for total of ~1.5 miles; working in the yard; sewing/quilting    PATIENT GOALS: "Be able to move and do things in the house and yard w/o worrying about falling."   OBJECTIVE:   DIAGNOSTIC FINDINGS:  08/27/22 - Lumbar spine x-ray completed as part of postop series - results pending   07/01/22 - Lumbar spine x-ray: Expected postoperative appearance related to L2-L3 fixation.   06/12/21 - Lumbar MRI: Postoperative and degenerative changes of the lumbar spine as described, similar compared to July 28, 2020. At L2-L3, there is moderate spinal canal stenosis due to disc bulging, endplate osteophytic spurring, facet and ligamentum flavum hypertrophy. Milder spinal canal stenosis is seen at L3-L4. There is multilevel neural foraminal stenosis.   PATIENT SURVEYS:  Modified Oswestry 22 / 50 = 44.0%   SCREENING FOR RED FLAGS: Bowel or bladder incontinence: Yes: bladder - takes Vesicare Spinal tumors: No Cauda equina syndrome: No Compression fracture: No Abdominal aneurysm: No  COGNITION:  Overall cognitive status: Within functional limits for tasks assessed    SENSATION: WFL - Pt reports LE sensation has improved since surgery  MUSCLE LENGTH: NT  POSTURE:  rounded shoulders, forward head, and right  pelvic obliquity  PALPATION: NT  LUMBAR ROM: Deferred pending MD clearance to release her from post-surgical precautions   Active  A/PROM  eval  Flexion   Extension   Right lateral flexion   Left lateral flexion   Right rotation   Left rotation   (Blank rows = not tested)  LOWER EXTREMITY ROM:    Grossly WFL  LOWER EXTREMITY MMT:  (tested in sitting on eval)  MMT Right eval Left eval  Hip flexion 4 4  Hip extension 4- 4-  Hip abduction 4+ 4+  Hip adduction 4+ 4+  Hip internal rotation 4- 4-  Hip external rotation 3+ 3+  Knee flexion 4+ 4+  Knee extension 4+ 4+  Ankle dorsiflexion 3- 3-  Ankle plantarflexion 4- NWB 4- NWB  Ankle inversion 3- 2-  Ankle eversion 4- 3+   (Blank rows = not tested)  LUMBAR SPECIAL TESTS:  NT  FUNCTIONAL TESTS:  5 times sit to stand: 22.19 sec Timed up and go (TUG): 14.84 sec with hiking pole (08/28/22) 10 meter walk test: 15.47 sec with hiking pole; Gait speed = 2.12 ft/sec (08/28/22) Berg Balance Scale: 27/56; < 36 high risk for falls (close to 100%)  Functional gait assessment: 8/30; < 19 = high risk fall (08/28/22) 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  Verbal and tactile cues throughout for technique. Rec bike - L3 x 6 min Seated B ankle inversion isometric 10 x 5", 2 sets Seated R/L ankle eversion isometric 10 x 5", 2 sets Seated B heel/toe raises x 20 SLS + opp LE hip ABD isometric into ball against counter 10 x 5" bil Standing R/L hip abduction (holding L knee bent during R hip ABD for better foot clearance d/t Trendelenburg hip drop) x 10 Standing R/L hip  extension (holding L knee bent during R hip EXT for better foot clearance d/t Trendelenburg hip drop) x 10   08/28/22 THERAPEUTIC ACTIVITIES: TUG: 14.84 sec with hiking pole 10MWT: 15.47 sec with hiking (completed with hiking pole as AD) Gait speed = 2.12 ft/sec with hiking pole FGA: 8/30  (hiking pole used during testing)  THERAPEUTIC EXERCISE: to improve flexibility, strength and mobility.  Verbal and tactile cues throughout for technique. Seated R 4-way ankle with RTB x 10 Seated L ankle DF, IV and EV AROM x 10 Seated B heel/toe raises x 20 SLS + opp LE hip ABD isometric into ball against counter 10 x 5" bil   08/26/22 Eval only   PATIENT EDUCATION:  Education details: PT eval findings, anticipated POC, and need for further assessment of dynamic balance while walking Person educated: Patient Education method: Explanation Education comprehension: verbalized understanding  HOME EXERCISE PROGRAM: Access Code: [email or web address] URL: [email or web address] Date: 08/28/2022 Prepared by: Annie Paras  Exercises - Seated Heel Toe Raises  - 1 x daily - 7 x weekly - 2 sets - 10 reps - 3 sec hold - Standing Isometric Hip Abduction with Ball on Wall  - 1 x daily - 7 x weekly - 2 sets - 10 reps - 3 sec hold   ASSESSMENT:  CLINICAL IMPRESSION: Jailey reports increased muscle soreness/fatigue following performance of home exercise program.  Cautioned her to avoid overdoing home exercises to the point where fatigue interferes with her safety with mobility as patient noted to have increased left foot  drop following ankle exercises as well as proximal fatigue following standing hip strengthening.  Cues necessary for proper alignment and muscle activation during exercise performance today the patient able to correct form as indicated.  [place] will benefit from skilled PT to address ongoing strength and balance deficits deficits to improve mobility and activity tolerance with decreased  fall risk.  OBJECTIVE IMPAIRMENTS: Abnormal gait, decreased activity tolerance, decreased balance, decreased coordination, decreased endurance, decreased mobility, difficulty walking, decreased ROM, decreased strength, decreased safety awareness, impaired perceived functional ability, impaired flexibility, improper body mechanics, postural dysfunction, and pain.   ACTIVITY LIMITATIONS: carrying, lifting, bending, sitting, standing, squatting, sleeping, stairs, transfers, bed mobility, bathing, toileting, dressing, reach over head, locomotion level, and caring for others  PARTICIPATION LIMITATIONS: meal prep, cleaning, laundry, interpersonal relationship, driving, shopping, community activity, yard work, and church  PERSONAL FACTORS: Age, Past/current experiences, Time since onset of injury/illness/exacerbation, and 3+ comorbidities: C5-7 ACDF 09/21/20; idiopathic progressive neuropathy; hypthyroidism; DDD; remote h/o back surgery at age 23; h/o scoliosis; Takotsubo cardiomyopathy 01/10/22   are also affecting patient's functional outcome.   REHAB POTENTIAL: Good  CLINICAL DECISION MAKING: Evolving/moderate complexity  EVALUATION COMPLEXITY: Moderate   GOALS: Goals reviewed with patient? Yes  SHORT TERM GOALS: Target date: 09/23/2022  Patient will be independent with initial HEP to improve outcomes and carryover.  Baseline:  Goal status: IN PROGRESS  2.  Complete balance assessment and update LTGs as indicated.  Baseline:  Goal status: MET  08/28/22  LONG TERM GOALS: Target date: 10/21/2022  Patient will be independent with ongoing/advanced HEP for self-management at home.  Baseline:  Goal status: IN PROGRESS  2.  Patient to demonstrate ability to achieve and maintain good spinal alignment/posturing and body mechanics needed for daily activities. Baseline:  Goal status: IN PROGRESS  3.  Patient will demonstrate functional pain free lumbar ROM to perform ADLs.   Baseline:  Goal  status: IN PROGRESS  4.  Patient will demonstrate improved B proximal LE strength to >/= 4 to 4+/5 for improved stability and ease of mobility. Baseline:  Goal status: IN PROGRESS  5  Patient will demonstrate improved B ankle strength to >/= 3+ to 4-/5 for improved gait stability. Baseline:  Goal status: IN PROGRESS  6.  Patient will ambulate with improved gait pattern with decreased B foot drop/slap and no evidence of instability/LOB. Baseline:  Goal status: IN PROGRESS   7. Patient will report </= 32% on Modified Oswestry to demonstrate improved functional ability with decreased pain interference. Baseline: 22 / 50 = 44.0 % Goal status: IN PROGRESS  8.  Patient will improve Berg score to >/= 36/56 to improve safety and stability with ADLs in standing and reduce risk for falls. Baseline: 27/56 Goal status: IN PROGRESS  9.  Patient will improve FGA score to >/= 19/30 to improve gait stability and reduce risk for falls. Baseline: 8/30 Goal status: IN PROGRESS   PLAN:  PT FREQUENCY: 2x/week  PT DURATION: 8 weeks  PLANNED INTERVENTIONS: Therapeutic exercises, Therapeutic activity, Neuromuscular re-education, Balance training, Gait training, Patient/Family education, Self Care, Joint mobilization, DME instructions, Aquatic Therapy, Dry Needling, Electrical stimulation, Cryotherapy, Moist heat, scar mobilization, Taping, Ultrasound, Ionotophoresis 15m/ml Dexamethasone, Manual therapy, and Re-evaluation - [id number] (E-stim) not approved per [facility] authorization  PLAN FOR NEXT SESSION: lower extremity strengthening; core stabilization; balance training and gait stability   JPercival Spanish PT 09/03/2022, 12:38 PM

## 2022-09-04 ENCOUNTER — Other Ambulatory Visit: Payer: Self-pay

## 2022-09-05 ENCOUNTER — Encounter: Payer: Self-pay | Admitting: Physical Therapy

## 2022-09-05 ENCOUNTER — Ambulatory Visit: Payer: Medicare PPO | Admitting: Physical Therapy

## 2022-09-05 DIAGNOSIS — M5416 Radiculopathy, lumbar region: Secondary | ICD-10-CM

## 2022-09-05 DIAGNOSIS — M6281 Muscle weakness (generalized): Secondary | ICD-10-CM

## 2022-09-05 DIAGNOSIS — R2681 Unsteadiness on feet: Secondary | ICD-10-CM

## 2022-09-05 DIAGNOSIS — R2689 Other abnormalities of gait and mobility: Secondary | ICD-10-CM

## 2022-09-05 NOTE — Therapy (Signed)
OUTPATIENT PHYSICAL THERAPY TREATMENT   Patient Name: [name] Trujillo MRN: [id number] DOB:1941/08/11, 81 y.o., female Today's Date: 09/05/2022  END OF SESSION:  PT End of Session - 09/05/22 1021     Visit Number 4    Date for PT Re-Evaluation 10/21/22    Authorization Type [employer]    Authorization Time Period 08/26/22 - 10/21/22    Authorization - Visit Number 4    Authorization - Number of Visits 10    PT Start Time 1021    PT Stop Time 1101    PT Time Calculation (min) 40 min    Activity Tolerance Patient tolerated treatment well    Behavior During Therapy [facility] for tasks assessed/performed              Past Medical History:  Diagnosis Date   High cholesterol    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   She had ACUTE TAKOTSUBO (STRESS) CARDIOMYOPATHY with elevated Troponin Levels - this would be considered "Demand Ischemia - Demand Infarction" & NOT associated with ACS/CAD.     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  - Cath with NO CAD. ==> RESOLVED: f/u Echo 03/2022: EF 60-65%.   Past Surgical History:  Procedure Laterality Date   [age]EN[age]CTOMY     17-18 yo   BACK SURGERY     Age 53   CESAREAN SECTION     ECTOPIC PREGNANCY SURGERY     LAPAROSCOPIC HYSTERECTOMY     LEFT HEART CATH AND CORONARY ANGIOGRAPHY N/A 01/09/2022   Procedure: LEFT HEART CATH AND CORONARY ANGIOGRAPHY;  Surgeon: Sherren Mocha, MD;  Location: [facility];  Service: CV:: Widely patent coronaries with mild nonobstructive LAD plaquing.  Right dominant system.  Normal LVEDP.  Based on clinical presentation, findings are consistent with acute Takotsubo Cardiomyopathy Syndrome   POSTERIOR  FUSION LUMBAR SPINE  05/24/2022   ([place]; [name], MD): L2-L3 XLIF, L2-L3 POSTERIOR DECOMPRESSION AND FUSION   TRANSTHORACIC ECHOCARDIOGRAM  01/08/2022   Severely decreased LV function-EF 25 to 30%.  Mid to apical (mostly anterior) with normal basal motion.  GR 2 DD-elevated LAP.  Mildly dilated LA.  Aortic sclerosis with no stenosis.  No AI.  Normal MV with mild to moderate TR.  Mildly elevated RAP, and PAP (estimated 49 mg). If LAD CAD ruled out - consistent with Takotsubo CM Syndrome.   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    PCP: Derinda Late, MD   REFERRING PROVIDER: Dorena Dew, NP for Hermenia Fiscal, MD   REFERRING DIAG: Z98.1 (ICD-10-CM) - S/P lumbar spinal fusion   THERAPY DIAG:  Radiculopathy, lumbar region  Muscle weakness (generalized)  Other abnormalities of gait  and mobility  Unsteadiness on feet  RATIONALE FOR EVALUATION AND TREATMENT: Rehabilitation  ONSET DATE: 05/24/2022 - L2-3 XLIF/PSF   NEXT MD VISIT: 10/16/22   SUBJECTIVE:                                                                                                                                                                                                         SUBJECTIVE STATEMENT: Patient continues to deny pain but does note some muscle soreness following exercises.  Recent imaging has been dropped off at MD's office but she has not heard from MD regarding  weaning brace or back precautions.  PAIN: Are you having pain? No  PERTINENT HISTORY:  L2-3 XLIF/PSF 05/24/22; C5-7 ACDF 09/21/20; idiopathic progressive neuropathy; hypthyroidism; DDD; remote h/o back surgery at age 38; h/o scoliosis; Takotsubo cardiomyopathy 01/10/22   PRECAUTIONS: Back - No BLT and continue with TLSO until MD reviews most recent x-rays  WEIGHT BEARING RESTRICTIONS: No  FALLS:  Has patient fallen in last 6 months? No  LIVING ENVIRONMENT: Lives with: lives with their spouse Lives in: House/apartment Stairs: Yes: External: 5-6 steps; on left going up Has following equipment at home: Walker - 2 wheeled, shower chair, and hiking/walking poles  OCCUPATION: Retired Pharmacist, hospital  PLOF: Independent and Leisure: walking daily for total of ~1.5 miles; working in the yard; sewing/quilting    PATIENT GOALS: "Be able to move and do things in the house and yard w/o worrying about falling."   OBJECTIVE:   DIAGNOSTIC FINDINGS:  08/27/22 - Lumbar spine x-ray completed as part of postop series - results pending   07/01/22 - Lumbar spine x-ray: Expected postoperative appearance related to L2-L3 fixation.   06/12/21 - Lumbar MRI: Postoperative and degenerative changes of the lumbar spine as described, similar compared to July 28, 2020. At L2-L3, there is moderate spinal canal stenosis due to disc bulging, endplate osteophytic spurring, facet and ligamentum flavum hypertrophy. Milder spinal canal stenosis is seen at L3-L4. There is multilevel neural foraminal stenosis.   PATIENT SURVEYS:  Modified Oswestry 22 / 50 = 44.0%   SCREENING FOR RED FLAGS: Bowel or bladder incontinence: Yes: bladder - takes Vesicare Spinal tumors: No Cauda equina syndrome: No Compression fracture: No Abdominal aneurysm: No  COGNITION:  Overall cognitive status: Within functional limits for tasks assessed    SENSATION: WFL - Pt reports LE sensation has improved since surgery  MUSCLE  LENGTH: NT  POSTURE:  rounded shoulders, forward head, and right pelvic obliquity  PALPATION: NT  LUMBAR ROM: Deferred pending MD clearance to release her from  post-surgical precautions   Active  A/PROM  eval  Flexion   Extension   Right lateral flexion   Left lateral flexion   Right rotation   Left rotation   (Blank rows = not tested)  LOWER EXTREMITY ROM:    Grossly WFL  LOWER EXTREMITY MMT:  (tested in sitting on eval)  MMT Right eval Left eval  Hip flexion 4 4  Hip extension 4- 4-  Hip abduction 4+ 4+  Hip adduction 4+ 4+  Hip internal rotation 4- 4-  Hip external rotation 3+ 3+  Knee flexion 4+ 4+  Knee extension 4+ 4+  Ankle dorsiflexion 3- 3-  Ankle plantarflexion 4- NWB 4- NWB  Ankle inversion 3- 2-  Ankle eversion 4- 3+   (Blank rows = not tested)  LUMBAR SPECIAL TESTS:  NT  FUNCTIONAL TESTS:  5 times sit to stand: 22.19 sec Timed up and go (TUG): 14.84 sec with hiking pole (08/28/22) 10 meter walk test: 15.47 sec with hiking pole; Gait speed = 2.12 ft/sec (08/28/22) Berg Balance Scale: 27/56; < 36 high risk for falls (close to 100%)  Functional gait assessment: 8/30; < 19 = high risk fall (08/28/22) 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  Verbal and tactile cues throughout for technique. Rec bike - L3 x 6 min Seated TrA isometric pressing into blue/green ball 10 x 5" Seated oblique isometric pressing into blue/green ball 10 x 5" bil Seated TrA + hip ADD isometric ball squeeze 10 x 5" Seated TrA + alt hip ABD/ER with looped RTB 10 x 5" Seated TrA + hip flexion march with looped RTB  10 x 3" Eccentric mini-squat with glute touches to Airex pad on mat table x 10  NEUROMUSCULAR RE-EDUCATION: To improve balance, proprioception, and reduce fall risk. Standing on Airex pad in front of mat table with back of chair in front of patient: Static stance x 30 seconds without upper extremity support Lateral weight shift x 20 Anterior posterior weight shift (church pews) x 20   09/03/22 THERAPEUTIC EXERCISE: to improve flexibility, strength and mobility.  Verbal and tactile cues throughout for technique. Rec bike - L3 x 6 min Seated B ankle inversion isometric 10 x 5", 2 sets Seated R/L ankle eversion isometric 10 x 5", 2 sets Seated B heel/toe raises x 20 SLS + opp LE hip ABD isometric into ball against counter 10 x 5" bil Standing R/L hip abduction (holding L knee bent during R hip ABD for better foot clearance d/t Trendelenburg hip drop) x 10 Standing R/L hip extension (holding L knee bent during R hip EXT for better foot clearance d/t Trendelenburg hip drop) x 10   08/28/22 THERAPEUTIC ACTIVITIES: TUG: 14.84 sec with hiking pole 10MWT: 15.47 sec with hiking (completed with hiking pole as AD) Gait speed = 2.12 ft/sec with hiking pole FGA: 8/30  (hiking pole used during testing)  THERAPEUTIC EXERCISE: to improve flexibility, strength and mobility.  Verbal and tactile cues throughout for technique. Seated R 4-way ankle with RTB x 10 Seated L ankle DF, IV and EV AROM x 10 Seated B heel/toe raises x 20 SLS + opp LE hip ABD isometric into ball against counter 10  x 5" bil   PATIENT EDUCATION:  Education details: PT eval findings, anticipated POC, and need for further assessment of dynamic balance while walking Person educated: Patient Education method: Explanation Education comprehension: verbalized understanding  HOME EXERCISE PROGRAM: Access Code: [email or web address] URL: [email or web address] Date: 09/05/2022 Prepared by: Annie Paras  Exercises - Seated Heel  Toe Raises  - 1 x daily - 7 x weekly - 2 sets - 10 reps - 3 sec hold - Standing Isometric Hip Abduction with Ball on Wall  - 1 x daily - 7 x weekly - 2 sets - 10 reps - 3 sec hold - Seated Transversus Abdominis Bracing  - 2 x daily - 7 x weekly - 2 sets - 10 reps - 3-5 sec hold   ASSESSMENT:  CLINICAL IMPRESSION: Desha notes some muscle soreness following her exercises but denies any increased pain.  Introduced core activation and isometric strengthening in sitting, as well as progressed proximal LE strengthening in conjunction with core activation with good tolerance.  Challenged balance with unstable surfaces working towards static control and improving proprioception with lateral and anterior/posterior weight shifts.  Patient requiring close guarding during balance activities with occasional need to grasp chair to correct balance with 1 incidence of patient needing to return to sit due to LOB.   OBJECTIVE IMPAIRMENTS: Abnormal gait, decreased activity tolerance, decreased balance, decreased coordination, decreased endurance, decreased mobility, difficulty walking, decreased ROM, decreased strength, decreased safety awareness, impaired perceived functional ability, impaired flexibility, improper body mechanics, postural dysfunction, and pain.   ACTIVITY LIMITATIONS: carrying, lifting, bending, sitting, standing, squatting, sleeping, stairs, transfers, bed mobility, bathing, toileting, dressing, reach over head, locomotion level, and caring for others  PARTICIPATION LIMITATIONS: meal prep, cleaning, laundry, interpersonal relationship, driving, shopping, community activity, yard work, and church  PERSONAL FACTORS: Age, Past/current experiences, Time since onset of injury/illness/exacerbation, and 3+ comorbidities: C5-7 ACDF 09/21/20; idiopathic progressive neuropathy; hypthyroidism; DDD; remote h/o back surgery at age 26; h/o scoliosis; Takotsubo cardiomyopathy 01/10/22   are also affecting patient's  functional outcome.   REHAB POTENTIAL: Good  CLINICAL DECISION MAKING: Evolving/moderate complexity  EVALUATION COMPLEXITY: Moderate   GOALS: Goals reviewed with patient? Yes  SHORT TERM GOALS: Target date: 09/23/2022  Patient will be independent with initial HEP to improve outcomes and carryover.  Baseline:  Goal status: IN PROGRESS  2.  Complete balance assessment and update LTGs as indicated.  Baseline:  Goal status: MET  08/28/22  LONG TERM GOALS: Target date: 10/21/2022  Patient will be independent with ongoing/advanced HEP for self-management at home.  Baseline:  Goal status: IN PROGRESS  2.  Patient to demonstrate ability to achieve and maintain good spinal alignment/posturing and body mechanics needed for daily activities. Baseline:  Goal status: IN PROGRESS  3.  Patient will demonstrate functional pain free lumbar ROM to perform ADLs.   Baseline:  Goal status: IN PROGRESS  4.  Patient will demonstrate improved B proximal LE strength to >/= 4 to 4+/5 for improved stability and ease of mobility. Baseline:  Goal status: IN PROGRESS  5  Patient will demonstrate improved B ankle strength to >/= 3+ to 4-/5 for improved gait stability. Baseline:  Goal status: IN PROGRESS  6.  Patient will ambulate with improved gait pattern with decreased B foot drop/slap and no evidence of instability/LOB. Baseline:  Goal status: IN PROGRESS   7. Patient will report </= 32% on Modified Oswestry to demonstrate improved functional ability with decreased pain interference. Baseline: 22 / 50 = 44.0 % Goal status:  IN PROGRESS  8.  Patient will improve Berg score to >/= 36/56 to improve safety and stability with ADLs in standing and reduce risk for falls. Baseline: 27/56 Goal status: IN PROGRESS  9.  Patient will improve FGA score to >/= 19/30 to improve gait stability and reduce risk for falls. Baseline: 8/30 Goal status: IN PROGRESS   PLAN:  PT FREQUENCY: 2x/week  PT  DURATION: 8 weeks  PLANNED INTERVENTIONS: Therapeutic exercises, Therapeutic activity, Neuromuscular re-education, Balance training, Gait training, Patient/Family education, Self Care, Joint mobilization, DME instructions, Aquatic Therapy, Dry Needling, Electrical stimulation, Cryotherapy, Moist heat, scar mobilization, Taping, Ultrasound, Ionotophoresis 17m/ml Dexamethasone, Manual therapy, and Re-evaluation - [id number] (E-stim) not approved per [facility] authorization  PLAN FOR NEXT SESSION: lower extremity strengthening; core stabilization; balance training and gait stability   JPercival Spanish PT 09/05/2022, 11:59 AM

## 2022-09-11 ENCOUNTER — Ambulatory Visit: Payer: Medicare PPO | Admitting: Physical Therapy

## 2022-09-11 ENCOUNTER — Telehealth: Payer: Self-pay | Admitting: Nurse Practitioner

## 2022-09-11 NOTE — Telephone Encounter (Signed)
X-ray lumbar spine reviewed. Instrumentation appears stable. Patient advise she may discontinue brace, progress activity slowly.    She will follow-up with Dr. Shawn Stall as scheduled.

## 2022-09-13 ENCOUNTER — Ambulatory Visit: Payer: Medicare PPO | Attending: Registered" | Admitting: Physical Therapy

## 2022-09-13 ENCOUNTER — Encounter: Payer: Self-pay | Admitting: Physical Therapy

## 2022-09-13 DIAGNOSIS — M6281 Muscle weakness (generalized): Secondary | ICD-10-CM | POA: Insufficient documentation

## 2022-09-13 DIAGNOSIS — M5416 Radiculopathy, lumbar region: Secondary | ICD-10-CM | POA: Insufficient documentation

## 2022-09-13 DIAGNOSIS — R2689 Other abnormalities of gait and mobility: Secondary | ICD-10-CM | POA: Diagnosis present

## 2022-09-13 DIAGNOSIS — R2681 Unsteadiness on feet: Secondary | ICD-10-CM | POA: Insufficient documentation

## 2022-09-13 NOTE — Therapy (Signed)
OUTPATIENT PHYSICAL THERAPY TREATMENT   Patient Name: Fabrienne Zebell MRN: [id number] DOB:1940/12/23, 82 y.o., female Today's Date: 09/13/2022  END OF SESSION:  PT End of Session - 09/13/22 1100     Visit Number 5    Date for PT Re-Evaluation 10/21/22    Authorization Type [employer]    Authorization Time Period 08/26/22 - 10/21/22    Authorization - Visit Number 5    Authorization - Number of Visits 10    PT Start Time 1100    PT Stop Time 1142    PT Time Calculation (min) 42 min    Activity Tolerance Patient tolerated treatment well    Behavior During Therapy [facility] for tasks assessed/performed              Past Medical History:  Diagnosis Date   High cholesterol    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   She had ACUTE TAKOTSUBO (STRESS) CARDIOMYOPATHY with elevated Troponin Levels - this would be considered "Demand Ischemia - Demand Infarction" & NOT associated with ACS/CAD.     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  - Cath with NO CAD. ==> RESOLVED: f/u Echo 03/2022: EF 60-65%.   Past Surgical History:  Procedure Laterality Date   APPEN[age]CTOMY     17-18 yo   BACK SURGERY     Age 80   CESAREAN SECTION     ECTOPIC PREGNANCY SURGERY     LAPAROSCOPIC HYSTERECTOMY     LEFT HEART CATH AND CORONARY ANGIOGRAPHY N/A 01/09/2022   Procedure: LEFT HEART CATH AND CORONARY ANGIOGRAPHY;  Surgeon: Sherren Mocha, MD;  Location: [facility];  Service: CV:: Widely patent coronaries with mild nonobstructive LAD plaquing.  Right dominant system.  Normal LVEDP.  Based on clinical presentation, findings are consistent with acute Takotsubo Cardiomyopathy Syndrome   POSTERIOR  FUSION LUMBAR SPINE  05/24/2022   ([place]; [name], MD): L2-L3 XLIF, L2-L3 POSTERIOR DECOMPRESSION AND FUSION   TRANSTHORACIC ECHOCARDIOGRAM  01/08/2022   Severely decreased LV function-EF 25 to 30%.  Mid to apical (mostly anterior) with normal basal motion.  GR 2 DD-elevated LAP.  Mildly dilated LA.  Aortic sclerosis with no stenosis.  No AI.  Normal MV with mild to moderate TR.  Mildly elevated RAP, and PAP (estimated 49 mg). If LAD CAD ruled out - consistent with Takotsubo CM Syndrome.   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    PCP: Derinda Late, MD   REFERRING PROVIDER: Dorena Dew, NP for Hermenia Fiscal, MD   REFERRING DIAG: Z98.1 (ICD-10-CM) - S/P lumbar spinal fusion   THERAPY DIAG:  Radiculopathy, lumbar region  Muscle weakness (generalized)  Other abnormalities of gait  and mobility  Unsteadiness on feet  RATIONALE FOR EVALUATION AND TREATMENT: Rehabilitation  ONSET DATE: 05/24/2022 - L2-3 XLIF/PSF   NEXT MD VISIT: 10/16/22   SUBJECTIVE:                                                                                                                                                                                                         SUBJECTIVE STATEMENT: Patient reports the MD was pleased with her latest x-rays and released her to wean from her TLSO but want her to progress cautiously. She reports her cardiologist wants to increase  her statins for her heart but warned her that she may feel some limb weakness from the increased dosage.  PAIN: Are you having pain? No  PERTINENT HISTORY:  L2-3 XLIF/PSF 05/24/22; C5-7 ACDF 09/21/20; idiopathic progressive neuropathy; hypthyroidism; DDD; remote h/o back surgery at age 23; h/o scoliosis; Takotsubo cardiomyopathy 01/10/22   PRECAUTIONS: Back - No BLT, TLSO discontinued as of 09/11/22  WEIGHT BEARING RESTRICTIONS: No  FALLS:  Has patient fallen in last 6 months? No  LIVING ENVIRONMENT: Lives with: lives with their spouse Lives in: House/apartment Stairs: Yes: External: 5-6 steps; on left going up Has following equipment at home: Walker - 2 wheeled, shower chair, and hiking/walking poles  OCCUPATION: Retired Pharmacist, hospital  PLOF: Independent and Leisure: walking daily for total of ~1.5 miles; working in the yard; sewing/quilting    PATIENT GOALS: "Be able to move and do things in the house and yard w/o worrying about falling."   OBJECTIVE:   DIAGNOSTIC FINDINGS:  08/27/22 - Lumbar spine x-ray completed as part of postop series - results pending   07/01/22 - Lumbar spine x-ray: Expected postoperative appearance related to L2-L3 fixation.   06/12/21 - Lumbar MRI: Postoperative and degenerative changes of the lumbar spine as described, similar compared to July 28, 2020. At L2-L3, there is moderate spinal canal stenosis due to disc bulging, endplate osteophytic spurring, facet and ligamentum flavum hypertrophy. Milder spinal canal stenosis is seen at L3-L4. There is multilevel neural foraminal stenosis.   PATIENT SURVEYS:  Modified Oswestry 22 / 50 = 44.0%   SCREENING FOR RED FLAGS: Bowel or bladder incontinence: Yes: bladder - takes Vesicare Spinal tumors: No Cauda equina syndrome: No Compression fracture: No Abdominal aneurysm: No  COGNITION:  Overall cognitive status: Within functional limits for tasks assessed    SENSATION: WFL - Pt reports LE sensation has  improved since surgery  MUSCLE LENGTH: NT  POSTURE:  rounded shoulders, forward head, and right pelvic obliquity  PALPATION: NT  LUMBAR  ROM: Deferred pending MD clearance to release her from post-surgical precautions   Active  A/PROM  eval  Flexion   Extension   Right lateral flexion   Left lateral flexion   Right rotation   Left rotation   (Blank rows = not tested)  LOWER EXTREMITY ROM:    Grossly WFL  LOWER EXTREMITY MMT:  (tested in sitting on eval)  MMT Right eval Left eval  Hip flexion 4 4  Hip extension 4- 4-  Hip abduction 4+ 4+  Hip adduction 4+ 4+  Hip internal rotation 4- 4-  Hip external rotation 3+ 3+  Knee flexion 4+ 4+  Knee extension 4+ 4+  Ankle dorsiflexion 3- 3-  Ankle plantarflexion 4- NWB 4- NWB  Ankle inversion 3- 2-  Ankle eversion 4- 3+   (Blank rows = not tested)  LUMBAR SPECIAL TESTS:  NT  FUNCTIONAL TESTS:  5 times sit to stand: 22.19 sec Timed up and go (TUG): 14.84 sec with hiking pole (08/28/22) 10 meter walk test: 15.47 sec with hiking pole; Gait speed = 2.12 ft/sec (08/28/22) Berg Balance Scale: 27/56; < 36 high risk for falls (close to 100%)  Functional gait assessment: 8/30; < 19 = high risk fall (08/28/22) 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  Verbal and tactile cues throughout for technique. Rec bike - L3 x 6 min Seated TrA isometric pressing into blue/green ball 15 x 5" Seated oblique isometric pressing into blue/green ball 15 x 5" bil Seated RTB B rows 10 x 5" Seated alt unilateral RTB row 10 x 3" each side Seated RTB R/L pallof press 10 x 3' each  side Seated TrA + alt hip ABD/ER with looped RTB 15 x 5" Seated TrA + hip flexion march with looped RTB 10 x 3"     09/05/22 THERAPEUTIC EXERCISE: to improve flexibility, strength and mobility.  Verbal and tactile cues throughout for technique. Rec bike - L3 x 6.5 min Seated TrA isometric pressing into blue/green ball 10 x 5" Seated oblique isometric pressing into blue/green ball 10 x 5" bil Seated TrA + hip ADD isometric ball squeeze 10 x 5" Seated TrA + alt hip ABD/ER with looped RTB 10 x 5" Seated TrA + hip flexion march with looped RTB 10 x 3" Eccentric mini-squat with glute touches to Airex pad on mat table x 10  NEUROMUSCULAR RE-EDUCATION: To improve balance, proprioception, and reduce fall risk. Standing on Airex pad in front of mat table with back of chair in front of patient: Static stance x 30 seconds without upper extremity support Lateral weight shift x 20 Anterior posterior weight shift (church pews) x 20   09/03/22 THERAPEUTIC EXERCISE: to improve flexibility, strength and mobility.  Verbal and tactile cues throughout for technique. Rec bike - L3 x 6 min Seated B ankle inversion isometric 10 x 5", 2 sets Seated R/L ankle eversion isometric 10 x 5", 2 sets Seated B heel/toe raises x 20 SLS + opp LE hip ABD isometric into ball against counter 10 x 5" bil Standing R/L hip abduction (holding L knee bent during R hip ABD for better foot clearance d/t Trendelenburg hip drop) x 10 Standing R/L hip extension (  holding L knee bent during R hip EXT for better foot clearance d/t Trendelenburg hip drop) x 10   08/28/22 THERAPEUTIC ACTIVITIES: TUG: 14.84 sec with hiking pole 10MWT: 15.47 sec with hiking (completed with hiking pole as AD) Gait speed = 2.12 ft/sec with hiking pole FGA: 8/30  (hiking pole used during testing)  THERAPEUTIC EXERCISE: to improve flexibility, strength and mobility.  Verbal and tactile cues throughout for technique. Seated R 4-way ankle with RTB x  10 Seated L ankle DF, IV and EV AROM x 10 Seated B heel/toe raises x 20 SLS + opp LE hip ABD isometric into ball against counter 10 x 5" bil   PATIENT EDUCATION:  Education details: PT eval findings, anticipated POC, and need for further assessment of dynamic balance while walking Person educated: Patient Education method: Explanation Education comprehension: verbalized understanding  HOME EXERCISE PROGRAM: Access Code: [email or web address] URL: [email or web address] Date: 09/13/2022 Prepared by: Annie Paras  Exercises - Seated Heel Toe Raises  - 1 x daily - 7 x weekly - 2 sets - 10 reps - 3 sec hold - Standing Isometric Hip Abduction with Ball on Wall  - 1 x daily - 7 x weekly - 2 sets - 10 reps - 3 sec hold - Seated Transversus Abdominis Bracing  - 2 x daily - 7 x weekly - 2 sets - 10 reps - 3-5 sec hold - Seated Isometric Hip Abduction with Resistance  - 1 x daily - 3 x weekly - 2 sets - 10 reps - 3 sec hold - Seated March with Resistance  - 1 x daily - 3 x weekly - 2 sets - 10 reps - 3 sec hold - Seated Shoulder Row with Anchored Resistance  - 1 x daily - 3 x weekly - 2 sets - 10 reps - 3-5 hold hold - Seated Single Arm Shoulder Row with Anchored Resistance  - 1 x daily - 3 x weekly - 2 sets - 10 reps - 3 sec hold - Seated Anti-Rotation Press With Anchored Resistance  - 1 x daily - 3 x weekly - 2 sets - 10 reps - 3 sec hold   ASSESSMENT:  CLINICAL IMPRESSION: Darriel was released from her TLSO as of Wed 09/11/22 by the MD, but cautioned to progress activity slowly. She also notes her cardiologist will be increasing her statin meds to double the dose starting tonight, and is worried that she will have difficulty telling if she has adverse reaction from the meds vs muscle soreness from the exercises. Exercises performed in sitting today focusing on awareness of core/trunk extensor activation. Pt requesting a list of the exercise for home, but cautioned her that not all exercises  need to be done at home and that strengthening exercises should only be performed qod.   OBJECTIVE IMPAIRMENTS: Abnormal gait, decreased activity tolerance, decreased balance, decreased coordination, decreased endurance, decreased mobility, difficulty walking, decreased ROM, decreased strength, decreased safety awareness, impaired perceived functional ability, impaired flexibility, improper body mechanics, postural dysfunction, and pain.   ACTIVITY LIMITATIONS: carrying, lifting, bending, sitting, standing, squatting, sleeping, stairs, transfers, bed mobility, bathing, toileting, dressing, reach over head, locomotion level, and caring for others  PARTICIPATION LIMITATIONS: meal prep, cleaning, laundry, interpersonal relationship, driving, shopping, community activity, yard work, and church  PERSONAL FACTORS: Age, Past/current experiences, Time since onset of injury/illness/exacerbation, and 3+ comorbidities: C5-7 ACDF 09/21/20; idiopathic progressive neuropathy; hypthyroidism; DDD; remote h/o back surgery at age 50; h/o scoliosis; Takotsubo cardiomyopathy 01/10/22   are also affecting patient's  functional outcome.   REHAB POTENTIAL: Good  CLINICAL DECISION MAKING: Evolving/moderate complexity  EVALUATION COMPLEXITY: Moderate   GOALS: Goals reviewed with patient? Yes  SHORT TERM GOALS: Target date: 09/23/2022  Patient will be independent with initial HEP to improve outcomes and carryover.  Baseline:  Goal status: MET  09/13/22  2.  Complete balance assessment and update LTGs as indicated.  Baseline:  Goal status: MET  08/28/22  LONG TERM GOALS: Target date: 10/21/2022  Patient will be independent with ongoing/advanced HEP for self-management at home.  Baseline:  Goal status: IN PROGRESS  2.  Patient to demonstrate ability to achieve and maintain good spinal alignment/posturing and body mechanics needed for daily activities. Baseline:  Goal status: IN PROGRESS  3.  Patient will  demonstrate functional pain free lumbar ROM to perform ADLs.   Baseline:  Goal status: IN PROGRESS  4.  Patient will demonstrate improved B proximal LE strength to >/= 4 to 4+/5 for improved stability and ease of mobility. Baseline:  Goal status: IN PROGRESS  5  Patient will demonstrate improved B ankle strength to >/= 3+ to 4-/5 for improved gait stability. Baseline:  Goal status: IN PROGRESS  6.  Patient will ambulate with improved gait pattern with decreased B foot drop/slap and no evidence of instability/LOB. Baseline:  Goal status: IN PROGRESS   7. Patient will report </= 32% on Modified Oswestry to demonstrate improved functional ability with decreased pain interference. Baseline: 22 / 50 = 44.0 % Goal status: IN PROGRESS  8.  Patient will improve Berg score to >/= 36/56 to improve safety and stability with ADLs in standing and reduce risk for falls. Baseline: 27/56 Goal status: IN PROGRESS  9.  Patient will improve FGA score to >/= 19/30 to improve gait stability and reduce risk for falls. Baseline: 8/30 Goal status: IN PROGRESS   PLAN:  PT FREQUENCY: 2x/week  PT DURATION: 8 weeks  PLANNED INTERVENTIONS: Therapeutic exercises, Therapeutic activity, Neuromuscular re-education, Balance training, Gait training, Patient/Family education, Self Care, Joint mobilization, DME instructions, Aquatic Therapy, Dry Needling, Electrical stimulation, Cryotherapy, Moist heat, scar mobilization, Taping, Ultrasound, Ionotophoresis 4mg /ml Dexamethasone, Manual therapy, and Re-evaluation - [id number] (E-stim) not approved per [facility] prior authorization  PLAN FOR NEXT SESSION: lower extremity strengthening; core stabilization; balance training and gait stability   Percival Spanish, PT 09/13/2022, 11:59 AM

## 2022-09-16 ENCOUNTER — Encounter: Payer: Medicare PPO | Admitting: Physical Therapy

## 2022-09-17 ENCOUNTER — Ambulatory Visit: Payer: Medicare PPO | Admitting: Physical Therapy

## 2022-09-17 ENCOUNTER — Encounter: Payer: Self-pay | Admitting: Physical Therapy

## 2022-09-17 DIAGNOSIS — M5416 Radiculopathy, lumbar region: Secondary | ICD-10-CM | POA: Diagnosis not present

## 2022-09-17 DIAGNOSIS — R2689 Other abnormalities of gait and mobility: Secondary | ICD-10-CM

## 2022-09-17 DIAGNOSIS — R2681 Unsteadiness on feet: Secondary | ICD-10-CM

## 2022-09-17 DIAGNOSIS — M6281 Muscle weakness (generalized): Secondary | ICD-10-CM

## 2022-09-17 NOTE — Therapy (Addendum)
OUTPATIENT PHYSICAL THERAPY TREATMENT   Patient Name: [name] Trujillo MRN: [id number] DOB:1941-03-09, 82 y.o., female Today's Date: 09/17/2022  END OF SESSION:  PT End of Session - 09/17/22 1457     Visit Number 6    Date for PT Re-Evaluation 10/21/22    Authorization Type [employer]    Authorization Time Period 08/26/22 - 10/21/22    Authorization - Visit Number 6    Authorization - Number of Visits 10    PT Start Time 1454    PT Stop Time 1536    PT Time Calculation (min) 42 min    Activity Tolerance Patient tolerated treatment well    Behavior During Therapy [facility] for tasks assessed/performed              Past Medical History:  Diagnosis Date   High cholesterol    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   She had ACUTE TAKOTSUBO (STRESS) CARDIOMYOPATHY with elevated Troponin Levels - this would be considered "Demand Ischemia - Demand Infarction" & NOT associated with ACS/CAD.     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  - Cath with NO CAD. ==> RESOLVED: f/u Echo 03/2022: EF 60-65%.   Past Surgical History:  Procedure Laterality Date   APPEN[age]CTOMY     48-18 yo   BACK SURGERY     Age 68   CESAREAN SECTION     ECTOPIC PREGNANCY SURGERY     LAPAROSCOPIC HYSTERECTOMY     LEFT HEART CATH AND CORONARY ANGIOGRAPHY N/A 01/09/2022   Procedure: LEFT HEART CATH AND CORONARY ANGIOGRAPHY;  Surgeon: Tonny Bollman, MD;  Location: [facility] INVASIVE CV LAB;  Service: CV:: Widely patent coronaries with mild nonobstructive LAD plaquing.  Right dominant system.  Normal LVEDP.  Based on clinical presentation, findings are consistent with acute Takotsubo Cardiomyopathy Syndrome   POSTERIOR  FUSION LUMBAR SPINE  05/24/2022   [facility], [place]; [name], MD): L2-L3 XLIF, L2-L3 POSTERIOR DECOMPRESSION AND FUSION   TRANSTHORACIC ECHOCARDIOGRAM  01/08/2022   Severely decreased LV function-EF 25 to 30%.  Mid to apical (mostly anterior) with normal basal motion.  GR 2 DD-elevated LAP.  Mildly dilated LA.  Aortic sclerosis with no stenosis.  No AI.  Normal MV with mild to moderate TR.  Mildly elevated RAP, and PAP (estimated 49 mg). If LAD CAD ruled out - consistent with Takotsubo CM Syndrome.   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    PCP: Mosetta Putt, MD   REFERRING PROVIDER: Rondel Oh, NP for Marlaine Hind, MD   REFERRING DIAG: Z98.1 (ICD-10-CM) - S/P lumbar spinal fusion   THERAPY DIAG:  Radiculopathy, lumbar region  Muscle weakness (generalized)  Other abnormalities of gait  and mobility  Unsteadiness on feet  RATIONALE FOR EVALUATION AND TREATMENT: Rehabilitation  ONSET DATE: 05/24/2022 - L2-3 XLIF/PSF   NEXT MD VISIT: 10/16/22   SUBJECTIVE:                                                                                                                                                                                                         SUBJECTIVE STATEMENT: Pt denies pain but did note some abdominal muscle soreness following the exercises last visit. She inquired about the [facility] authorization letter she received which  denied  PAIN: Are you having pain? No  PERTINENT HISTORY:  L2-3 XLIF/PSF 05/24/22; C5-7 ACDF 09/21/20; idiopathic progressive neuropathy; hypthyroidism; DDD; remote h/o back surgery at age 4; h/o scoliosis; Takotsubo cardiomyopathy 01/10/22   PRECAUTIONS: Back - No BLT, TLSO discontinued as of 09/11/22  WEIGHT BEARING RESTRICTIONS: No  FALLS:  Has patient fallen in last 6 months? No  LIVING ENVIRONMENT: Lives with: lives with their spouse Lives in: House/apartment Stairs: Yes: External: 5-6 steps; on left going up Has following equipment at home: Walker - 2 wheeled, shower chair, and hiking/walking poles  OCCUPATION: Retired Pharmacist, hospital  PLOF: Independent and Leisure: walking daily for total of ~1.5 miles; working in the yard; sewing/quilting    PATIENT GOALS: "Be able to move and do things in the house and yard w/o worrying about falling."   OBJECTIVE:   DIAGNOSTIC FINDINGS:  08/27/22 - Lumbar spine x-ray completed as part of postop series - results pending   07/01/22 - Lumbar spine x-ray: Expected postoperative appearance related to L2-L3 fixation.   06/12/21 - Lumbar MRI: Postoperative and degenerative changes of the lumbar spine as described, similar compared to July 28, 2020. At L2-L3, there is moderate spinal canal stenosis due to disc bulging, endplate osteophytic spurring, facet and ligamentum flavum hypertrophy. Milder spinal canal stenosis is seen at L3-L4. There is multilevel neural foraminal stenosis.   PATIENT SURVEYS:  Modified Oswestry 22 / 50 = 44.0%   SCREENING FOR RED FLAGS: Bowel or bladder incontinence: Yes: bladder - takes Vesicare Spinal tumors: No Cauda equina syndrome: No Compression fracture: No Abdominal aneurysm: No  COGNITION:  Overall cognitive status: Within functional limits for tasks assessed    SENSATION: WFL - Pt reports LE sensation has improved since surgery  MUSCLE LENGTH: NT  POSTURE:  rounded shoulders, forward head, and right  pelvic obliquity  PALPATION: NT  LUMBAR ROM: Deferred pending MD clearance to release her from post-surgical precautions   Active  A/PROM  eval  Flexion   Extension  Right lateral flexion   Left lateral flexion   Right rotation   Left rotation   (Blank rows = not tested)  LOWER EXTREMITY ROM:    Grossly WFL  LOWER EXTREMITY MMT:  (tested in sitting on eval)  MMT Right eval Left eval  Hip flexion 4 4  Hip extension 4- 4-  Hip abduction 4+ 4+  Hip adduction 4+ 4+  Hip internal rotation 4- 4-  Hip external rotation 3+ 3+  Knee flexion 4+ 4+  Knee extension 4+ 4+  Ankle dorsiflexion 3- 3-  Ankle plantarflexion 4- NWB 4- NWB  Ankle inversion 3- 2-  Ankle eversion 4- 3+   (Blank rows = not tested)  LUMBAR SPECIAL TESTS:  NT  FUNCTIONAL TESTS:  5 times sit to stand: 22.19 sec Timed up and go (TUG): 14.84 sec with hiking pole (08/28/22) 10 meter walk test: 15.47 sec with hiking pole; Gait speed = 2.12 ft/sec (08/28/22) Berg Balance Scale: 27/56; < 36 high risk for falls (close to 100%)  Functional gait assessment: 8/30; < 19 = high risk fall (08/28/22) Left hip Trendelenburg evident in right SLS  GAIT: Distance walked: 60 Assistive device utilized:  single walking/hiking pole on R Level of assistance: SBA Gait pattern:  increased sway, step through pattern, decreased stride length, trendelenburg, and lateral hip instability Comments: L>R foot slap   TODAY'S TREATMENT:   09/17/22 THERAPEUTIC EXERCISE: to improve flexibility, strength and mobility.  Verbal and tactile cues throughout for technique. Rec bike - L4 x 3 min, L3 x 3 min Standing RTB B rows 10 x 5" Standing RTB R/L pallof press 10 x 3' each side Eccentric glute touches to Airex pad on mat table x 12; UE support on back of chair Seated B arch lifts with feet resting on Airex pad 10 x 3-5"  NEUROMUSCULAR RE-EDUCATION: To improve posture, balance, proprioception, coordination, and reduce fall  risk. Standing in bare feet on Airex pad in front of mat table with with intermittent UE support on back of chair: Lateral weight x 10 Anterior/posterior weight shift (church pews) x 10   09/13/22 THERAPEUTIC EXERCISE: to improve flexibility, strength and mobility.  Verbal and tactile cues throughout for technique. Rec bike - L3 x 6 min Seated TrA isometric pressing into blue/green ball 15 x 5" Seated oblique isometric pressing into blue/green ball 15 x 5" bil Seated RTB B rows 10 x 5" Seated alt unilateral RTB row 10 x 3" each side Seated RTB R/L pallof press 10 x 3' each side Seated TrA + alt hip ABD/ER with looped RTB 15 x 5" Seated TrA + hip flexion march with looped RTB 10 x 3"   09/05/22 THERAPEUTIC EXERCISE: to improve flexibility, strength and mobility.  Verbal and tactile cues throughout for technique. Rec bike - L3 x 6.5 min Seated TrA isometric pressing into blue/green ball 10 x 5" Seated oblique isometric pressing into blue/green ball 10 x 5" bil Seated TrA + hip ADD isometric ball squeeze 10 x 5" Seated TrA + alt hip ABD/ER with looped RTB 10 x 5" Seated TrA + hip flexion march with looped RTB 10 x 3" Eccentric mini-squat with glute touches to Airex pad on mat table x 10  NEUROMUSCULAR RE-EDUCATION: To improve balance, proprioception, and reduce fall risk. Standing on Airex pad in front of mat table with back of chair in front of patient: Static stance x 30 seconds without upper extremity support Lateral weight shift x 20 Anterior posterior weight shift (church pews)  x 20   PATIENT EDUCATION:  Education details: progress with PT and HEP progression Person educated: Patient Education method: Explanation Education comprehension: verbalized understanding  HOME EXERCISE PROGRAM: Access Code: [email or web address] URL: [email or web address] Date: 09/13/2022 Prepared by: Annie Paras  Exercises - Seated Heel Toe Raises  - 1 x daily - 7 x weekly - 2 sets - 10 reps  - 3 sec hold - Standing Isometric Hip Abduction with Ball on Wall  - 1 x daily - 7 x weekly - 2 sets - 10 reps - 3 sec hold - Seated Transversus Abdominis Bracing  - 2 x daily - 7 x weekly - 2 sets - 10 reps - 3-5 sec hold - Seated Isometric Hip Abduction with Resistance  - 1 x daily - 3 x weekly - 2 sets - 10 reps - 3 sec hold - Seated March with Resistance  - 1 x daily - 3 x weekly - 2 sets - 10 reps - 3 sec hold - Seated Shoulder Row with Anchored Resistance  - 1 x daily - 3 x weekly - 2 sets - 10 reps - 3-5 hold hold - Seated Single Arm Shoulder Row with Anchored Resistance  - 1 x daily - 3 x weekly - 2 sets - 10 reps - 3 sec hold - Seated Anti-Rotation Press With Anchored Resistance  - 1 x daily - 3 x weekly - 2 sets - 10 reps - 3 sec hold   ASSESSMENT:  CLINICAL IMPRESSION: Skyley notes that she still does not feel quite as steady w/o her TLSO, noting feeling of falling forward while standing at the sink to brush her teeth. Progressed some core strengthening to standing with rows and pallof press, but close guarding necessary to ensure no LOB. Resumed balance and proprioceptive training on Airex pad, again with close guarding, with pt noting improving sensory awareness in parts of each foot. Limited arch control noted in standing, therefore worked on this in sitting. Pt fatigued by end of session noting more awareness of foot slap than on arrival to PT.  OBJECTIVE IMPAIRMENTS: Abnormal gait, decreased activity tolerance, decreased balance, decreased coordination, decreased endurance, decreased mobility, difficulty walking, decreased ROM, decreased strength, decreased safety awareness, impaired perceived functional ability, impaired flexibility, improper body mechanics, postural dysfunction, and pain.   ACTIVITY LIMITATIONS: carrying, lifting, bending, sitting, standing, squatting, sleeping, stairs, transfers, bed mobility, bathing, toileting, dressing, reach over head, locomotion level, and  caring for others  PARTICIPATION LIMITATIONS: meal prep, cleaning, laundry, interpersonal relationship, driving, shopping, community activity, yard work, and church  PERSONAL FACTORS: Age, Past/current experiences, Time since onset of injury/illness/exacerbation, and 3+ comorbidities: C5-7 ACDF 09/21/20; idiopathic progressive neuropathy; hypthyroidism; DDD; remote h/o back surgery at age 39; h/o scoliosis; Takotsubo cardiomyopathy 01/10/22   are also affecting patient's functional outcome.   REHAB POTENTIAL: Good  CLINICAL DECISION MAKING: Evolving/moderate complexity  EVALUATION COMPLEXITY: Moderate   GOALS: Goals reviewed with patient? Yes  SHORT TERM GOALS: Target date: 09/23/2022  Patient will be independent with initial HEP to improve outcomes and carryover.  Baseline:  Goal status: MET  09/13/22  2.  Complete balance assessment and update LTGs as indicated.  Baseline:  Goal status: MET  08/28/22  LONG TERM GOALS: Target date: 10/21/2022  Patient will be independent with ongoing/advanced HEP for self-management at home.  Baseline:  Goal status: IN PROGRESS  2.  Patient to demonstrate ability to achieve and maintain good spinal alignment/posturing and body mechanics needed for daily activities. Baseline:  Goal status:  IN PROGRESS  3.  Patient will demonstrate functional pain free lumbar ROM to perform ADLs.   Baseline:  Goal status: IN PROGRESS  4.  Patient will demonstrate improved B proximal LE strength to >/= 4 to 4+/5 for improved stability and ease of mobility. Baseline:  Goal status: IN PROGRESS  5  Patient will demonstrate improved B ankle strength to >/= 3+ to 4-/5 for improved gait stability. Baseline:  Goal status: IN PROGRESS  6.  Patient will ambulate with improved gait pattern with decreased B foot drop/slap and no evidence of instability/LOB. Baseline:  Goal status: IN PROGRESS   7. Patient will report </= 32% on Modified Oswestry to demonstrate  improved functional ability with decreased pain interference. Baseline: 22 / 50 = 44.0 % Goal status: IN PROGRESS  8.  Patient will improve Berg score to >/= 36/56 to improve safety and stability with ADLs in standing and reduce risk for falls. Baseline: 27/56 Goal status: IN PROGRESS  9.  Patient will improve FGA score to >/= 19/30 to improve gait stability and reduce risk for falls. Baseline: 8/30 Goal status: IN PROGRESS   PLAN:  PT FREQUENCY: 2x/week  PT DURATION: 8 weeks  PLANNED INTERVENTIONS: Therapeutic exercises, Therapeutic activity, Neuromuscular re-education, Balance training, Gait training, Patient/Family education, Self Care, Joint mobilization, DME instructions, Aquatic Therapy, Dry Needling, Electrical stimulation, Cryotherapy, Moist heat, scar mobilization, Taping, Ultrasound, Ionotophoresis 4mg /ml Dexamethasone, Manual therapy, and Re-evaluation - [phone] approved for E-stim, but [id number] (E-stim) not approved per [facility] prior authorization  PLAN FOR NEXT SESSION: lower extremity strengthening; core stabilization; balance training and gait stability   Percival Spanish, PT 09/17/2022, 3:53 PM

## 2022-09-19 ENCOUNTER — Encounter: Payer: Self-pay | Admitting: Physical Therapy

## 2022-09-19 ENCOUNTER — Ambulatory Visit: Payer: Medicare PPO | Admitting: Physical Therapy

## 2022-09-19 DIAGNOSIS — M5416 Radiculopathy, lumbar region: Secondary | ICD-10-CM

## 2022-09-19 DIAGNOSIS — R2689 Other abnormalities of gait and mobility: Secondary | ICD-10-CM

## 2022-09-19 DIAGNOSIS — M6281 Muscle weakness (generalized): Secondary | ICD-10-CM

## 2022-09-19 DIAGNOSIS — R2681 Unsteadiness on feet: Secondary | ICD-10-CM

## 2022-09-19 NOTE — Therapy (Signed)
OUTPATIENT PHYSICAL THERAPY TREATMENT   Patient Name: [name] Trujillo MRN: [id number] DOB:1941-05-26, 82 y.o., female Today's Date: 09/19/2022  END OF SESSION:  PT End of Session - 09/19/22 1102     Visit Number 7    Date for PT Re-Evaluation 10/21/22    Authorization Type [employer]    Authorization Time Period 08/26/22 - 10/21/22    Authorization - Visit Number 7    Authorization - Number of Visits 10    PT Start Time 1102    PT Stop Time 1147    PT Time Calculation (min) 45 min    Activity Tolerance Patient tolerated treatment well    Behavior During Therapy [facility] for tasks assessed/performed              Past Medical History:  Diagnosis Date   High cholesterol    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   She had ACUTE TAKOTSUBO (STRESS) CARDIOMYOPATHY with elevated Troponin Levels - this would be considered "Demand Ischemia - Demand Infarction" & NOT associated with ACS/CAD.     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  - Cath with NO CAD. ==> RESOLVED: f/u Echo 03/2022: EF 60-65%.   Past Surgical History:  Procedure Laterality Date   APPENDECTOMY     90-18 yo   BACK SURGERY     Age 48   CESAREAN SECTION     ECTOPIC PREGNANCY SURGERY     LAPAROSCOPIC HYSTERECTOMY     LEFT HEART CATH AND CORONARY ANGIOGRAPHY N/A 01/09/2022   Procedure: LEFT HEART CATH AND CORONARY ANGIOGRAPHY;  Surgeon: Tonny Bollman, MD;  Location: [facility] INVASIVE CV LAB;  Service: CV:: Widely patent coronaries with mild nonobstructive LAD plaquing.  Right dominant system.  Normal LVEDP.  Based on clinical presentation, findings are consistent with acute Takotsubo Cardiomyopathy Syndrome   POSTERIOR  FUSION LUMBAR SPINE  05/24/2022   [facility], [place]; [name], MD): L2-L3 XLIF, L2-L3 POSTERIOR DECOMPRESSION AND FUSION   TRANSTHORACIC ECHOCARDIOGRAM  01/08/2022   Severely decreased LV function-EF 25 to 30%.  Mid to apical (mostly anterior) with normal basal motion.  GR 2 DD-elevated LAP.  Mildly dilated LA.  Aortic sclerosis with no stenosis.  No AI.  Normal MV with mild to moderate TR.  Mildly elevated RAP, and PAP (estimated 49 mg). If LAD CAD ruled out - consistent with Takotsubo CM Syndrome.   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    PCP: Mosetta Putt, MD   REFERRING PROVIDER: Rondel Oh, NP for Marlaine Hind, MD   REFERRING DIAG: Z98.1 (ICD-10-CM) - S/P lumbar spinal fusion   THERAPY DIAG:  Radiculopathy, lumbar region  Muscle weakness (generalized)  Other abnormalities of gait  and mobility  Unsteadiness on feet  RATIONALE FOR EVALUATION AND TREATMENT: Rehabilitation  ONSET DATE: 05/24/2022 - L2-3 XLIF/PSF   NEXT MD VISIT: 10/16/22   SUBJECTIVE:                                                                                                                                                                                                         SUBJECTIVE STATEMENT: Pt reports increased LE muscle soreness yesterday after her last PT visit 2 days ago - thinks it may have been due to increasing the resistance on the bike but not sure if the  recent increased statin dose may be also affecting the muscles.  PAIN: Are you having pain? No  PERTINENT HISTORY:  L2-3 XLIF/PSF 05/24/22; C5-7 ACDF 09/21/20; idiopathic progressive neuropathy; hypthyroidism; DDD; remote h/o back surgery at age 24; h/o scoliosis; Takotsubo cardiomyopathy 01/10/22   PRECAUTIONS: Back - No BLT, TLSO discontinued as of 09/11/22  WEIGHT BEARING RESTRICTIONS: No  FALLS:  Has patient fallen in last 6 months? No  LIVING ENVIRONMENT: Lives with: lives with their spouse Lives in: House/apartment Stairs: Yes: External: 5-6 steps; on left going up Has following equipment at home: Walker - 2 wheeled, shower chair, and hiking/walking poles  OCCUPATION: Retired Pharmacist, hospital  PLOF: Independent and Leisure: walking daily for total of ~1.5 miles; working in the yard; sewing/quilting    PATIENT GOALS: "Be able to move and do things in the house and yard w/o worrying about falling."   OBJECTIVE:   DIAGNOSTIC FINDINGS:  08/27/22 - Lumbar spine x-ray completed as part of postop series - results pending   07/01/22 - Lumbar spine x-ray: Expected postoperative appearance related to L2-L3 fixation.   06/12/21 - Lumbar MRI: Postoperative and degenerative changes of the lumbar spine as described, similar compared to July 28, 2020. At L2-L3, there is moderate spinal canal stenosis due to disc bulging, endplate osteophytic spurring, facet and ligamentum flavum hypertrophy. Milder spinal canal stenosis is seen at L3-L4. There is multilevel neural foraminal stenosis.   PATIENT SURVEYS:  Modified Oswestry 22 / 50 = 44.0%   SCREENING FOR RED FLAGS: Bowel or bladder incontinence: Yes: bladder - takes Vesicare Spinal tumors: No Cauda equina syndrome: No Compression fracture: No Abdominal aneurysm: No  COGNITION:  Overall cognitive status: Within functional limits for tasks assessed    SENSATION: WFL - Pt reports LE sensation has improved since surgery  MUSCLE  LENGTH: NT  POSTURE:  rounded shoulders, forward head, and right pelvic obliquity  PALPATION: NT  LUMBAR ROM: Deferred pending MD clearance to  release her from post-surgical precautions   Active  A/PROM  eval  Flexion   Extension   Right lateral flexion   Left lateral flexion   Right rotation   Left rotation   (Blank rows = not tested)  LOWER EXTREMITY ROM:    Grossly WFL  LOWER EXTREMITY MMT:  (tested in sitting on eval)  MMT Right eval Left eval  Hip flexion 4 4  Hip extension 4- 4-  Hip abduction 4+ 4+  Hip adduction 4+ 4+  Hip internal rotation 4- 4-  Hip external rotation 3+ 3+  Knee flexion 4+ 4+  Knee extension 4+ 4+  Ankle dorsiflexion 3- 3-  Ankle plantarflexion 4- NWB 4- NWB  Ankle inversion 3- 2-  Ankle eversion 4- 3+   (Blank rows = not tested)  LUMBAR SPECIAL TESTS:  NT  FUNCTIONAL TESTS:  5 times sit to stand: 22.19 sec Timed up and go (TUG): 14.84 sec with hiking pole (08/28/22) 10 meter walk test: 15.47 sec with hiking pole; Gait speed = 2.12 ft/sec (08/28/22) Berg Balance Scale: 27/56; < 36 high risk for falls (close to 100%)  Functional gait assessment: 8/30; < 19 = high risk fall (08/28/22) Left hip Trendelenburg evident in right SLS  GAIT: Distance walked: 60 Assistive device utilized:  single walking/hiking pole on R Level of assistance: SBA Gait pattern:  increased sway, step through pattern, decreased stride length, trendelenburg, and lateral hip instability Comments: L>R foot slap   TODAY'S TREATMENT:   09/19/22 THERAPEUTIC EXERCISE: to improve flexibility, strength and mobility.  Verbal and tactile cues throughout for technique. Rec bike - L4 x 1 min, L3 x 5 min R ankle DF, IV, EV with red TB 2 x 10 each L ankle DF, IV, EV AROM 2 x 10 each - still with minimal inversion Seated hip hinge at bedtime stretch 2 x 30 sec; cues to keep back straight, avoiding rounding lumbar spine Standing R/L gastroc runner's stretch 2 x 30 sec;  cues for upright posture to increase hip flexor stretch in this position  THERAPEUTIC ACTIVITIES: B Side-stepping along counter 10 ft x 3 each direction B Side-stepping along counter with looped yellow TB at knees 10 ft x 3 each direction Fwb/back monster walk along counter with looped yellow TB at knees 10 ft x 3 each direction; SBA of PT  NEUROMUSCULAR RE-EDUCATION: To improve balance, proprioception, coordination, and reduce fall risk. Tandem stance at counter 2 x 20 sec with each foot fwd Tandem gait with intermittent UE support on counter and SBA of PT 10 ft x 2   09/17/22 THERAPEUTIC EXERCISE: to improve flexibility, strength and mobility.  Verbal and tactile cues throughout for technique. Rec bike - L4 x 3 min, L3 x 3 min Standing RTB B rows 10 x 5" Standing RTB R/L pallof press 10 x 3' each side Eccentric glute touches to Airex pad on mat table x 12; UE support on back of chair Seated B arch lifts with feet resting on Airex pad 10 x 3-5"  NEUROMUSCULAR RE-EDUCATION: To improve posture, balance, proprioception, coordination, and reduce fall risk. Standing in bare on Airex pad in front of mat table with with intermittent UE support on back of chair: Lateral weight x 10 Anterior/posterior weight shift (church pews) x 10   09/13/22 THERAPEUTIC EXERCISE: to improve flexibility, strength and mobility.  Verbal and tactile cues throughout for technique. Rec bike - L3 x 6 min Seated TrA isometric pressing into blue/green ball 15 x 5" Seated  oblique isometric pressing into blue/green ball 15 x 5" bil Seated RTB B rows 10 x 5" Seated alt unilateral RTB row 10 x 3" each side Seated RTB R/L pallof press 10 x 3' each side Seated TrA + alt hip ABD/ER with looped RTB 15 x 5" Seated TrA + hip flexion march with looped RTB 10 x 3"   PATIENT EDUCATION:  Education details: progress with PT and HEP progression Person educated: Patient Education method: Explanation Education comprehension:  verbalized understanding  HOME EXERCISE PROGRAM: Access Code: [email or web address] URL: [email or web address] Date: 09/13/2022 Prepared by: Glenetta Hew  Exercises - Seated Heel Toe Raises  - 1 x daily - 7 x weekly - 2 sets - 10 reps - 3 sec hold - Standing Isometric Hip Abduction with Ball on Wall  - 1 x daily - 7 x weekly - 2 sets - 10 reps - 3 sec hold - Seated Transversus Abdominis Bracing  - 2 x daily - 7 x weekly - 2 sets - 10 reps - 3-5 sec hold - Seated Isometric Hip Abduction with Resistance  - 1 x daily - 3 x weekly - 2 sets - 10 reps - 3 sec hold - Seated March with Resistance  - 1 x daily - 3 x weekly - 2 sets - 10 reps - 3 sec hold - Seated Shoulder Row with Anchored Resistance  - 1 x daily - 3 x weekly - 2 sets - 10 reps - 3-5 hold hold - Seated Single Arm Shoulder Row with Anchored Resistance  - 1 x daily - 3 x weekly - 2 sets - 10 reps - 3 sec hold - Seated Anti-Rotation Press With Anchored Resistance  - 1 x daily - 3 x weekly - 2 sets - 10 reps - 3 sec hold   ASSESSMENT:  CLINICAL IMPRESSION: Tierre reports increased muscle soreness following last session but uncertain if the recent changes in her statin medications are making this more pronounced.  She is able to demonstrate increased AROM and tolerance for resistance with right ankle motions but continues to be more limited on left.  She notes an increased sense of tightness with feeling of need for stretching predominantly in the right LE, therefore reviewed stretching for hamstrings, gastroc and hip flexors.  Therapeutic activities targeting multidirectional movements for improved balance with patient able to demonstrate improved stability during tandem stance and gait.  Contrina will continue to benefit from skilled PT to address above strength, motor control, and balance deficits to improve mobility and activity tolerance with decreased risk for falls.   OBJECTIVE IMPAIRMENTS: Abnormal gait, decreased activity  tolerance, decreased balance, decreased coordination, decreased endurance, decreased mobility, difficulty walking, decreased ROM, decreased strength, decreased safety awareness, impaired perceived functional ability, impaired flexibility, improper body mechanics, postural dysfunction, and pain.   ACTIVITY LIMITATIONS: carrying, lifting, bending, sitting, standing, squatting, sleeping, stairs, transfers, bed mobility, bathing, toileting, dressing, reach over head, locomotion level, and caring for others  PARTICIPATION LIMITATIONS: meal prep, cleaning, laundry, interpersonal relationship, driving, shopping, community activity, yard work, and church  PERSONAL FACTORS: Age, Past/current experiences, Time since onset of injury/illness/exacerbation, and 3+ comorbidities: C5-7 ACDF 09/21/20; idiopathic progressive neuropathy; hypthyroidism; DDD; remote h/o back surgery at age 89; h/o scoliosis; Takotsubo cardiomyopathy 01/10/22   are also affecting patient's functional outcome.   REHAB POTENTIAL: Good  CLINICAL DECISION MAKING: Evolving/moderate complexity  EVALUATION COMPLEXITY: Moderate   GOALS: Goals reviewed with patient? Yes  SHORT TERM GOALS: Target date: 09/23/2022  Patient will be independent  with initial HEP to improve outcomes and carryover.  Baseline:  Goal status: MET  09/13/22  2.  Complete balance assessment and update LTGs as indicated.  Baseline:  Goal status: MET  08/28/22  LONG TERM GOALS: Target date: 10/21/2022  Patient will be independent with ongoing/advanced HEP for self-management at home.  Baseline:  Goal status: IN PROGRESS  2.  Patient to demonstrate ability to achieve and maintain good spinal alignment/posturing and body mechanics needed for daily activities. Baseline:  Goal status: IN PROGRESS  3.  Patient will demonstrate functional pain free lumbar ROM to perform ADLs.   Baseline:  Goal status: IN PROGRESS  4.  Patient will demonstrate improved B proximal LE  strength to >/= 4 to 4+/5 for improved stability and ease of mobility. Baseline:  Goal status: IN PROGRESS  5  Patient will demonstrate improved B ankle strength to >/= 3+ to 4-/5 for improved gait stability. Baseline:  Goal status: IN PROGRESS  6.  Patient will ambulate with improved gait pattern with decreased B foot drop/slap and no evidence of instability/LOB. Baseline:  Goal status: IN PROGRESS   7. Patient will report </= 32% on Modified Oswestry to demonstrate improved functional ability with decreased pain interference. Baseline: 22 / 50 = 44.0 % Goal status: IN PROGRESS  8.  Patient will improve Berg score to >/= 36/56 to improve safety and stability with ADLs in standing and reduce risk for falls. Baseline: 27/56 Goal status: IN PROGRESS  9.  Patient will improve FGA score to >/= 19/30 to improve gait stability and reduce risk for falls. Baseline: 8/30 Goal status: IN PROGRESS   PLAN:  PT FREQUENCY: 2x/week  PT DURATION: 8 weeks  PLANNED INTERVENTIONS: Therapeutic exercises, Therapeutic activity, Neuromuscular re-education, Balance training, Gait training, Patient/Family education, Self Care, Joint mobilization, DME instructions, Aquatic Therapy, Dry Needling, Electrical stimulation, Cryotherapy, Moist heat, scar mobilization, Taping, Ultrasound, Ionotophoresis 4mg /ml Dexamethasone, Manual therapy, and Re-evaluation - [phone] approved for E-stim, but [id number] (E-stim) not approved per [facility] prior authorization  PLAN FOR NEXT SESSION: lower extremity strengthening; core stabilization; balance training and gait stability   Percival Spanish, PT 09/19/2022, 12:33 PM

## 2022-09-23 ENCOUNTER — Encounter: Payer: Medicare PPO | Admitting: Physical Therapy

## 2022-09-24 ENCOUNTER — Ambulatory Visit: Payer: Medicare PPO | Admitting: Physical Therapy

## 2022-09-26 ENCOUNTER — Encounter: Payer: Medicare PPO | Admitting: Physical Therapy

## 2022-09-30 ENCOUNTER — Ambulatory Visit: Payer: Medicare PPO | Admitting: Physical Therapy

## 2022-10-01 ENCOUNTER — Encounter: Payer: Self-pay | Admitting: Physical Therapy

## 2022-10-01 ENCOUNTER — Ambulatory Visit: Payer: Medicare PPO | Admitting: Physical Therapy

## 2022-10-01 DIAGNOSIS — M5416 Radiculopathy, lumbar region: Secondary | ICD-10-CM

## 2022-10-01 DIAGNOSIS — M6281 Muscle weakness (generalized): Secondary | ICD-10-CM

## 2022-10-01 DIAGNOSIS — R2681 Unsteadiness on feet: Secondary | ICD-10-CM

## 2022-10-01 DIAGNOSIS — R2689 Other abnormalities of gait and mobility: Secondary | ICD-10-CM

## 2022-10-01 NOTE — Therapy (Signed)
OUTPATIENT PHYSICAL THERAPY TREATMENT   Patient Name: [name] Trujillo MRN: [id number] DOB:[birth year], 82 y.o., female Today's Date: 10/01/2022  END OF SESSION:  PT End of Session - 10/01/22 1310     Visit Number 8    Date for PT Re-Evaluation 10/21/22    Authorization Type [employer]    Authorization Time Period 08/26/22 - 10/21/22    Authorization - Visit Number 8    Authorization - Number of Visits 10    PT Start Time 1310    PT Stop Time 1355    PT Time Calculation (min) 45 min    Activity Tolerance Patient tolerated treatment well    Behavior During Therapy [facility] for tasks assessed/performed               Past Medical History:  Diagnosis Date   High cholesterol    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   She had ACUTE TAKOTSUBO (STRESS) CARDIOMYOPATHY with elevated Troponin Levels - this would be considered "Demand Ischemia - Demand Infarction" & NOT associated with ACS/CAD.     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  - Cath with NO CAD. ==> RESOLVED: f/u Echo 03/2022: EF 60-65%.   Past Surgical History:  Procedure Laterality Date   [age]EN[age]CTOMY     17-18 yo   BACK SURGERY     Age 69   CESAREAN SECTION     ECTOPIC PREGNANCY SURGERY     LAPAROSCOPIC HYSTERECTOMY     LEFT HEART CATH AND CORONARY ANGIOGRAPHY N/A 01/09/2022   Procedure: LEFT HEART CATH AND CORONARY ANGIOGRAPHY;  Surgeon: Sherren Mocha, MD;  Location: [facility];  Service: CV:: Widely patent coronaries with mild nonobstructive LAD plaquing.  Right dominant system.  Normal LVEDP.  Based on clinical presentation, findings are consistent with acute Takotsubo Cardiomyopathy Syndrome   POSTERIOR  FUSION LUMBAR SPINE  05/24/2022   ([facility]; [name], MD): L2-L3 XLIF, L2-L3 POSTERIOR DECOMPRESSION AND FUSION   TRANSTHORACIC ECHOCARDIOGRAM  01/08/2022   Severely decreased LV function-EF 25 to 30%.  Mid to apical (mostly anterior) with normal basal motion.  GR 2 DD-elevated LAP.  Mildly dilated LA.  Aortic sclerosis with no stenosis.  No AI.  Normal MV with mild to moderate TR.  Mildly elevated RAP, and PAP (estimated 49 mg). If LAD CAD ruled out - consistent with Takotsubo CM Syndrome.   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    PCP: Derinda Late, MD   REFERRING PROVIDER: Dorena Dew, NP for Hermenia Fiscal, MD   REFERRING DIAG: Z98.1 (ICD-10-CM) - S/P lumbar spinal fusion   THERAPY DIAG:  Radiculopathy, lumbar region  Muscle weakness (generalized)  Other abnormalities of  gait and mobility  Unsteadiness on feet  RATIONALE FOR EVALUATION AND TREATMENT: Rehabilitation  ONSET DATE: 05/24/2022 - L2-3 XLIF/PSF   NEXT MD VISIT: 10/16/22   SUBJECTIVE:                                                                                                                                                                                                         SUBJECTIVE STATEMENT: Had the flu last week and hasn't been able to do much, felt like she pulled a muscle (QL) on L side while doing rows so she stopped doing them. Has had some trouble with walking but  says she feels more of her sensation coming back to her LE.   PAIN: Are you having pain? No  PERTINENT HISTORY:  L2-3 XLIF/PSF 05/24/22; C5-7 ACDF 09/21/20; idiopathic progressive neuropathy; hypthyroidism; DDD; remote h/o back surgery at age 75; h/o scoliosis; Takotsubo cardiomyopathy 01/10/22   PRECAUTIONS: Back - No BLT, TLSO discontinued as of 09/11/22  WEIGHT BEARING RESTRICTIONS: No  FALLS:  Has patient fallen in last 6 months? No  LIVING ENVIRONMENT: Lives with: lives with their spouse Lives in: House/apartment Stairs: Yes: External: 5-6 steps; on left going up Has following equipment at home: Walker - 2 wheeled, shower chair, and hiking/walking poles  OCCUPATION: Retired Runner, broadcasting/film/video  PLOF: Independent and Leisure: walking daily for total of ~1.5 miles; working in the yard; sewing/quilting    PATIENT GOALS: "Be able to move and do things in the house and yard w/o worrying about falling."   OBJECTIVE:   DIAGNOSTIC FINDINGS:  08/27/22 - Lumbar spine x-ray completed as part of postop series - results pending   07/01/22 - Lumbar spine x-ray: Expected postoperative appearance related to L2-L3 fixation.   06/12/21 - Lumbar MRI: Postoperative and degenerative changes of the lumbar spine as described, similar compared to July 28, 2020. At L2-L3, there is moderate spinal canal stenosis due to disc bulging, endplate osteophytic spurring, facet and ligamentum flavum hypertrophy. Milder spinal canal stenosis is seen at L3-L4. There is multilevel neural foraminal stenosis.   PATIENT SURVEYS:  Modified Oswestry 22 / 50 = 44.0%   SCREENING FOR RED FLAGS: Bowel or bladder incontinence: Yes: bladder - takes Vesicare Spinal tumors: No Cauda equina syndrome: No Compression fracture: No Abdominal aneurysm: No  COGNITION:  Overall cognitive status: Within functional limits for tasks assessed    SENSATION: WFL - Pt reports LE sensation has improved since surgery  MUSCLE  LENGTH: NT  POSTURE:  rounded shoulders, forward head, and right pelvic obliquity  PALPATION: NT  LUMBAR ROM: Deferred pending MD clearance to release her from post-surgical precautions   Active  A/PROM  eval  Flexion   Extension   Right lateral flexion   Left lateral flexion   Right rotation   Left rotation   (Blank rows = not tested)  LOWER EXTREMITY ROM:    Grossly WFL  LOWER EXTREMITY MMT:  (tested in sitting on eval)  MMT Right eval Left eval  Hip flexion 4 4  Hip extension 4- 4-  Hip abduction 4+ 4+  Hip adduction 4+ 4+  Hip internal rotation 4- 4-  Hip external rotation 3+ 3+  Knee flexion 4+ 4+  Knee extension 4+ 4+  Ankle dorsiflexion 3- 3-  Ankle plantarflexion 4- NWB 4- NWB  Ankle inversion 3- 2-  Ankle eversion 4- 3+   (Blank rows = not tested)  LUMBAR SPECIAL TESTS:  NT  FUNCTIONAL TESTS:  5 times sit to stand: 22.19 sec Timed up and go (TUG): 14.84 sec with hiking pole (08/28/22) 10 meter walk test: 15.47 sec with hiking pole; Gait speed = 2.12 ft/sec (08/28/22) Berg Balance Scale: 27/56; < 36 high risk for falls (close to 100%)  Functional gait assessment: 8/30; < 19 = high risk fall (08/28/22) Left hip Trendelenburg evident in right SLS  GAIT: Distance walked: 60 Assistive device utilized:  single walking/hiking pole on R Level of assistance: SBA Gait pattern:  increased sway, step through pattern, decreased stride length, trendelenburg, and lateral hip instability Comments: L>R foot slap   TODAY'S TREATMENT:   10/01/22 THERAPEUTIC EXERCISE: to improve flexibility, strength and mobility.  Verbal and tactile cues throughout for technique. Rec bike - L3 x 6 min R ankle DF,IV, EV with RTB, PF with GTB 2 x 10 each BAPS L3 L ankle DF & PF; L2 IV, EV AROM 1 x 10 each (try with R foot next time); limited inversion & with mirror for visual feedback  Tandem Stance 2x30" Bilat  Tandem Gait x3    09/19/22 THERAPEUTIC EXERCISE: to improve  flexibility, strength and mobility.  Verbal and tactile cues throughout for technique. Rec bike - L4 x 1 min, L3 x 5 min R ankle DF, IV, EV with red TB 2 x 10 each L ankle DF, IV, EV AROM 2 x 10 each - still with minimal inversion Seated hip hinge at bedtime stretch 2 x 30 sec; cues to keep back straight, avoiding rounding lumbar spine Standing R/L gastroc runner's stretch 2 x 30 sec; cues for upright posture to increase hip flexor stretch in this position  THERAPEUTIC ACTIVITIES: B Side-stepping along counter 10 ft x 3 each direction B Side-stepping along counter with looped yellow TB at knees 10 ft x 3 each direction Fwb/back monster walk along counter with looped yellow TB at knees 10 ft x 3 each direction; SBA of PT  NEUROMUSCULAR RE-EDUCATION: To improve balance, proprioception, coordination, and reduce fall risk. Tandem stance at counter 2 x 20 sec with each foot fwd Tandem gait with intermittent UE support on counter and SBA of PT 10 ft x 2   09/17/22 THERAPEUTIC EXERCISE: to improve flexibility, strength and mobility.  Verbal and tactile cues throughout for technique. Rec bike - L4 x 3 min, L3 x 3 min Standing RTB B rows 10 x 5" Standing RTB R/L pallof press 10 x 3' each side Eccentric glute touches to Airex pad on mat table x 12; UE support on back of chair Seated B arch lifts with feet resting on Airex  pad 10 x 3-5"  NEUROMUSCULAR RE-EDUCATION: To improve posture, balance, proprioception, coordination, and reduce fall risk. Standing in bare on Airex pad in front of mat table with with intermittent UE support on back of chair: Lateral weight x 10 Anterior/posterior weight shift (church pews) x 10   PATIENT EDUCATION:  Education details: progress with PT Person educated: Patient Education method: Explanation Education comprehension: verbalized understanding  HOME EXERCISE PROGRAM: Access Code: [email or web address] URL: [email or web address] Date: 09/13/2022 Prepared  by: Glenetta Hew  Exercises - Seated Heel Toe Raises  - 1 x daily - 7 x weekly - 2 sets - 10 reps - 3 sec hold - Standing Isometric Hip Abduction with Ball on Wall  - 1 x daily - 7 x weekly - 2 sets - 10 reps - 3 sec hold - Seated Transversus Abdominis Bracing  - 2 x daily - 7 x weekly - 2 sets - 10 reps - 3-5 sec hold - Seated Isometric Hip Abduction with Resistance  - 1 x daily - 3 x weekly - 2 sets - 10 reps - 3 sec hold - Seated March with Resistance  - 1 x daily - 3 x weekly - 2 sets - 10 reps - 3 sec hold - Seated Shoulder Row with Anchored Resistance  - 1 x daily - 3 x weekly - 2 sets - 10 reps - 3-5 hold hold - Seated Single Arm Shoulder Row with Anchored Resistance  - 1 x daily - 3 x weekly - 2 sets - 10 reps - 3 sec hold - Seated Anti-Rotation Press With Anchored Resistance  - 1 x daily - 3 x weekly - 2 sets - 10 reps - 3 sec hold   ASSESSMENT:  CLINICAL IMPRESSION:  Pt came into today's session after being sick for a week with the flu. Due to the illness, she stated she was not able to do much of her exercises at home. The focus of today's session was the increase the proprioception in her ankles to aid with gait and ADLs. Pt had moderate difficulty performing the ROM on her L ankle with the BAPS board, so this is something that should continue to be addressed. Balance work was also conducted towards the end of the session but pt felt fatigued because of the previous ankle exercises. Pt will continue to benefit from skilled PT intervention as she continues to be motivated to achieve her goals and finish her POC.    OBJECTIVE IMPAIRMENTS: Abnormal gait, decreased activity tolerance, decreased balance, decreased coordination, decreased endurance, decreased mobility, difficulty walking, decreased ROM, decreased strength, decreased safety awareness, impaired perceived functional ability, impaired flexibility, improper body mechanics, postural dysfunction, and pain.   ACTIVITY LIMITATIONS:  carrying, lifting, bending, sitting, standing, squatting, sleeping, stairs, transfers, bed mobility, bathing, toileting, dressing, reach over head, locomotion level, and caring for others  PARTICIPATION LIMITATIONS: meal prep, cleaning, laundry, interpersonal relationship, driving, shopping, community activity, yard work, and church  PERSONAL FACTORS: Age, Past/current experiences, Time since onset of injury/illness/exacerbation, and 3+ comorbidities: C5-7 ACDF 09/21/20; idiopathic progressive neuropathy; hypthyroidism; DDD; remote h/o back surgery at age 10; h/o scoliosis; Takotsubo cardiomyopathy 01/10/22   are also affecting patient's functional outcome.   REHAB POTENTIAL: Good  CLINICAL DECISION MAKING: Evolving/moderate complexity  EVALUATION COMPLEXITY: Moderate   GOALS: Goals reviewed with patient? Yes  SHORT TERM GOALS: Target date: 09/23/2022  Patient will be independent with initial HEP to improve outcomes and carryover.  Baseline:  Goal status: MET  09/13/22  2.  Complete balance assessment and update LTGs as indicated.  Baseline:  Goal status: MET  08/28/22  LONG TERM GOALS: Target date: 10/21/2022  Patient will be independent with ongoing/advanced HEP for self-management at home.  Baseline:  Goal status: IN PROGRESS  2.  Patient to demonstrate ability to achieve and maintain good spinal alignment/posturing and body mechanics needed for daily activities. Baseline:  Goal status: IN PROGRESS  3.  Patient will demonstrate functional pain free lumbar ROM to perform ADLs.   Baseline:  Goal status: IN PROGRESS  4.  Patient will demonstrate improved B proximal LE strength to >/= 4 to 4+/5 for improved stability and ease of mobility. Baseline:  Goal status: IN PROGRESS  5  Patient will demonstrate improved B ankle strength to >/= 3+ to 4-/5 for improved gait stability. Baseline:  Goal status: IN PROGRESS  6.  Patient will ambulate with improved gait pattern with decreased  B foot drop/slap and no evidence of instability/LOB. Baseline:  Goal status: IN PROGRESS   7. Patient will report </= 32% on Modified Oswestry to demonstrate improved functional ability with decreased pain interference. Baseline: 22 / 50 = 44.0 % Goal status: IN PROGRESS  8.  Patient will improve Berg score to >/= 36/56 to improve safety and stability with ADLs in standing and reduce risk for falls. Baseline: 27/56 Goal status: IN PROGRESS  9.  Patient will improve FGA score to >/= 19/30 to improve gait stability and reduce risk for falls. Baseline: 8/30 Goal status: IN PROGRESS   PLAN:  PT FREQUENCY: 2x/week  PT DURATION: 8 weeks  PLANNED INTERVENTIONS: Therapeutic exercises, Therapeutic activity, Neuromuscular re-education, Balance training, Gait training, Patient/Family education, Self Care, Joint mobilization, DME instructions, Aquatic Therapy, Dry Needling, Electrical stimulation, Cryotherapy, Moist heat, scar mobilization, Taping, Ultrasound, Ionotophoresis 4mg /ml Dexamethasone, Manual therapy, and Re-evaluation - [phone] approved for E-stim, but [id number] (E-stim) not approved per [facility] prior authorization  PLAN FOR NEXT SESSION: begin to reassess LTGs; continue BAPS/ankle mobility & strengthening, core/lumbar strengthening; balance training and gait stability    Landy Dunnavant Romero-Perozo, Student-PT 10/01/2022, 2:04 PM

## 2022-10-03 ENCOUNTER — Ambulatory Visit: Payer: Medicare PPO | Admitting: Physical Therapy

## 2022-10-03 DIAGNOSIS — M5416 Radiculopathy, lumbar region: Secondary | ICD-10-CM | POA: Diagnosis not present

## 2022-10-03 DIAGNOSIS — R2681 Unsteadiness on feet: Secondary | ICD-10-CM

## 2022-10-03 DIAGNOSIS — R2689 Other abnormalities of gait and mobility: Secondary | ICD-10-CM

## 2022-10-03 DIAGNOSIS — M6281 Muscle weakness (generalized): Secondary | ICD-10-CM

## 2022-10-03 NOTE — Therapy (Signed)
OUTPATIENT PHYSICAL THERAPY TREATMENT   Patient Name: [name] Trujillo MRN: [id number] DOB:1941/01/03, 82 y.o., female Today's Date: 10/03/2022  END OF SESSION:  PT End of Session - 10/03/22 1104     Visit Number 9    Date for PT Re-Evaluation 10/21/22    Authorization Type [employer]    Authorization Time Period 08/26/22 - 10/21/22    Authorization - Visit Number 9    Authorization - Number of Visits 10    Progress Note Due on Visit 10    PT Start Time 1104    PT Stop Time 1153    PT Time Calculation (min) 49 min    Activity Tolerance Patient tolerated treatment well    Behavior During Therapy [facility] for tasks assessed/performed               Past Medical History:  Diagnosis Date   High cholesterol    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   She had ACUTE TAKOTSUBO (STRESS) CARDIOMYOPATHY with elevated Troponin Levels - this would be considered "Demand Ischemia - Demand Infarction" & NOT associated with ACS/CAD.     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  - Cath with NO CAD. ==> RESOLVED: f/u Echo 03/2022: EF 60-65%.   Past Surgical History:  Procedure Laterality Date   APPENDECTOMY     16-18 yo   BACK SURGERY     Age 61   CESAREAN SECTION     ECTOPIC PREGNANCY SURGERY     LAPAROSCOPIC HYSTERECTOMY     LEFT HEART CATH AND CORONARY ANGIOGRAPHY N/A 01/09/2022   Procedure: LEFT HEART CATH AND CORONARY ANGIOGRAPHY;  Surgeon: Tonny Bollman, MD;  Location: [facility] INVASIVE CV LAB;  Service: CV:: Widely patent coronaries with mild nonobstructive LAD plaquing.  Right dominant system.  Normal LVEDP.  Based on clinical presentation, findings are consistent with acute Takotsubo  Cardiomyopathy Syndrome   POSTERIOR FUSION LUMBAR SPINE  05/24/2022   [facility] [place]; [name], MD): L2-L3 XLIF, L2-L3 POSTERIOR DECOMPRESSION AND FUSION   TRANSTHORACIC ECHOCARDIOGRAM  01/08/2022   Severely decreased LV function-EF 25 to 30%.  Mid to apical (mostly anterior) with normal basal motion.  GR 2 DD-elevated LAP.  Mildly dilated LA.  Aortic sclerosis with no stenosis.  No AI.  Normal MV with mild to moderate TR.  Mildly elevated RAP, and PAP (estimated 49 mg). If LAD CAD ruled out - consistent with Takotsubo CM Syndrome.   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    PCP: Mosetta Putt, MD   REFERRING PROVIDER: Rondel Oh, NP for Marlaine Hind, MD   REFERRING DIAG: Z98.1 (ICD-10-CM) - S/P lumbar spinal fusion   THERAPY DIAG:  Radiculopathy, lumbar  region  Muscle weakness (generalized)  Other abnormalities of gait and mobility  Unsteadiness on feet  RATIONALE FOR EVALUATION AND TREATMENT: Rehabilitation  ONSET DATE: 05/24/2022 - L2-3 XLIF/PSF   NEXT MD VISIT: 10/16/22   SUBJECTIVE:                                                                                                                                                                                                         SUBJECTIVE STATEMENT: Pt reports working with the [employer] board last visit felt like it helped her "find" the muscles in her foot and ankle to activate  them.  PAIN: Are you having pain? No  PERTINENT HISTORY:  L2-3 XLIF/PSF 05/24/22; C5-7 ACDF 09/21/20; idiopathic progressive neuropathy; hypthyroidism; DDD; remote h/o back surgery at age 14; h/o scoliosis; Takotsubo cardiomyopathy 01/10/22   PRECAUTIONS: Back - No BLT, TLSO discontinued as of 09/11/22  WEIGHT BEARING RESTRICTIONS: No  FALLS:  Has patient fallen in last 6 months? No  LIVING ENVIRONMENT: Lives with: lives with their spouse Lives in: House/apartment Stairs: Yes: External: 5-6 steps; on left going up Has following equipment at home: Walker - 2 wheeled, shower chair, and hiking/walking poles  OCCUPATION: Retired Runner, broadcasting/film/video  PLOF: Independent and Leisure: walking daily for total of ~1.5 miles; working in the yard; sewing/quilting    PATIENT GOALS: "Be able to move and do things in the house and yard w/o worrying about falling."   OBJECTIVE:   DIAGNOSTIC FINDINGS:  08/27/22 - Lumbar spine x-ray completed as part of postop series - results pending   07/01/22 - Lumbar spine x-ray: Expected postoperative appearance related to L2-L3 fixation.   06/12/21 - Lumbar MRI: Postoperative and degenerative changes of the lumbar spine as described, similar compared to July 28, 2020. At L2-L3, there is moderate spinal canal stenosis due to disc bulging, endplate osteophytic spurring, facet and ligamentum flavum hypertrophy. Milder spinal canal stenosis is seen at L3-L4. There is multilevel neural foraminal stenosis.   PATIENT SURVEYS:  Modified Oswestry 22 / 50 = 44.0%   SCREENING FOR RED FLAGS: Bowel or bladder incontinence: Yes: bladder - takes Vesicare Spinal tumors: No Cauda equina syndrome: No Compression fracture: No Abdominal aneurysm: No  COGNITION:  Overall cognitive status: Within functional limits for tasks assessed    SENSATION: WFL - Pt reports LE sensation has improved since surgery  MUSCLE LENGTH: NT  POSTURE:  rounded shoulders, forward head, and right  pelvic obliquity  PALPATION: NT  LUMBAR ROM: Deferred pending MD clearance to release her from post-surgical precautions   Active  A/PROM  eval  Flexion   Extension   Right lateral flexion   Left lateral flexion   Right rotation   Left rotation   (Blank rows = not tested)  LOWER EXTREMITY ROM:    Grossly WFL  LOWER EXTREMITY MMT:  (tested in sitting on eval)  MMT Right eval Left eval  Hip flexion 4 4  Hip extension 4- 4-  Hip abduction 4+ 4+  Hip adduction 4+ 4+  Hip internal rotation 4- 4-  Hip external rotation 3+ 3+  Knee flexion 4+ 4+  Knee extension 4+ 4+  Ankle dorsiflexion 3- 3-  Ankle plantarflexion 4- NWB 4- NWB  Ankle inversion 3- 2-  Ankle eversion 4- 3+   (Blank rows = not tested)  LUMBAR SPECIAL TESTS:  NT  FUNCTIONAL TESTS:  5 times sit to stand: 22.19 sec Timed up and go (TUG): 14.84 sec with hiking pole (08/28/22) 10 meter walk test: 15.47 sec with hiking pole; Gait speed = 2.12 ft/sec (08/28/22) Berg Balance Scale: 27/56; < 36 high risk for falls (close to 100%)  Functional gait assessment: 8/30; < 19 = high risk fall (08/28/22) Left hip Trendelenburg evident in right SLS  GAIT: Distance walked: 60 Assistive device utilized:  single walking/hiking pole on R Level of assistance: SBA Gait pattern:  increased sway, step through pattern, decreased stride length, trendelenburg, and lateral hip instability Comments: L>R foot slap   TODAY'S TREATMENT:   10/03/22 THERAPEUTIC EXERCISE: to improve flexibility, strength and mobility.  Verbal and tactile cues throughout for technique. Rec bike - L3 x 6 min R/L SLS + opp LE 4-way SLR x 10 each direction; UE support on back of chair for balance - cues for neutral LE alignment on resisted LE Seated BAPS - L2 R/L ankle DF & PF from neutral (level board) x 10 each R/L IV/EV from neutral x 10 each - more difficulty than DF/PF especially into inversion L>R R/L CW/CCW circles rolling edge along floor x  10 each   10/01/22 THERAPEUTIC EXERCISE: to improve flexibility, strength and mobility.  Verbal and tactile cues throughout for technique. Rec bike - L3 x 6 min R ankle DF,IV, EV with RTB, PF with GTB 2 x 10 each BAPS L3 L ankle DF & PF; L2 IV, EV AROM 1 x 10 each (try with R foot next time); limited inversion & with mirror for visual feedback  Tandem Stance 2x30" Bilat  Tandem Gait x3    09/19/22 THERAPEUTIC EXERCISE: to improve flexibility, strength and mobility.  Verbal and tactile cues throughout for technique. Rec bike - L4 x 1 min, L3 x 5 min R ankle DF, IV, EV with red TB 2 x 10 each L ankle DF, IV, EV AROM 2 x 10 each - still with minimal inversion Seated hip hinge HS stretch 2 x 30 sec; cues to keep back straight, avoiding rounding lumbar spine Standing R/L gastroc runner's stretch 2 x 30 sec; cues for upright posture to increase hip flexor stretch in this position  THERAPEUTIC ACTIVITIES: B Side-stepping along counter 10 ft x 3 each direction B Side-stepping along counter with looped yellow TB at knees 10 ft x 3 each direction Fwb/back monster walk along counter with looped yellow TB at knees 10 ft x 3 each direction; SBA of PT  NEUROMUSCULAR RE-EDUCATION: To improve balance, proprioception, coordination, and reduce fall risk. Tandem stance at counter 2 x 20 sec with each foot fwd Tandem gait with intermittent UE support on counter and SBA  of PT 10 ft x 2   PATIENT EDUCATION:  Education details: progress with PT Person educated: Patient Education method: Explanation Education comprehension: verbalized understanding  HOME EXERCISE PROGRAM: Access Code: [email or web address] URL: [email or web address] Date: 09/13/2022 Prepared by: Annie Paras  Exercises - Seated Heel Toe Raises  - 1 x daily - 7 x weekly - 2 sets - 10 reps - 3 sec hold - Standing Isometric Hip Abduction with Ball on Wall  - 1 x daily - 7 x weekly - 2 sets - 10 reps - 3 sec hold - Seated Transversus  Abdominis Bracing  - 2 x daily - 7 x weekly - 2 sets - 10 reps - 3-5 sec hold - Seated Isometric Hip Abduction with Resistance  - 1 x daily - 3 x weekly - 2 sets - 10 reps - 3 sec hold - Seated March with Resistance  - 1 x daily - 3 x weekly - 2 sets - 10 reps - 3 sec hold - Seated Shoulder Row with Anchored Resistance  - 1 x daily - 3 x weekly - 2 sets - 10 reps - 3-5 hold hold - Seated Single Arm Shoulder Row with Anchored Resistance  - 1 x daily - 3 x weekly - 2 sets - 10 reps - 3 sec hold - Seated Anti-Rotation Press With Anchored Resistance  - 1 x daily - 3 x weekly - 2 sets - 10 reps - 3 sec hold   ASSESSMENT:  CLINICAL IMPRESSION:  Glenys reports improved awareness of muscle activation in her ankles following the BAPS board exercises last visit, therefore worked on this again today bilaterally - she continues to have more difficulty with lateral control into inversion/eversion but was able to better isolate the motions for the first few reps. Challenged SLS stability while continuing to progress hip strengthening with introduction of YTB resisted 4-way SLR in standing utilizing back of chair for UE support for balance as needed - limited control in some patterns even with minimal resistance with cues necessary to maintain neutral LE alignment particularly in abduction and adduction motions. She is due for her 10th progress note next visit and will also require [facility] reauthorization - may consider early recert pending progress with LTGs.  OBJECTIVE IMPAIRMENTS: Abnormal gait, decreased activity tolerance, decreased balance, decreased coordination, decreased endurance, decreased mobility, difficulty walking, decreased ROM, decreased strength, decreased safety awareness, impaired perceived functional ability, impaired flexibility, improper body mechanics, postural dysfunction, and pain.   ACTIVITY LIMITATIONS: carrying, lifting, bending, sitting, standing, squatting, sleeping, stairs,  transfers, bed mobility, bathing, toileting, dressing, reach over head, locomotion level, and caring for others  PARTICIPATION LIMITATIONS: meal prep, cleaning, laundry, interpersonal relationship, driving, shopping, community activity, yard work, and church  PERSONAL FACTORS: Age, Past/current experiences, Time since onset of injury/illness/exacerbation, and 3+ comorbidities: C5-7 ACDF 09/21/20; idiopathic progressive neuropathy; hypthyroidism; DDD; remote h/o back surgery at age 74; h/o scoliosis; Takotsubo cardiomyopathy 01/10/22   are also affecting patient's functional outcome.   REHAB POTENTIAL: Good  CLINICAL DECISION MAKING: Evolving/moderate complexity  EVALUATION COMPLEXITY: Moderate   GOALS: Goals reviewed with patient? Yes  SHORT TERM GOALS: Target date: 09/23/2022  Patient will be independent with initial HEP to improve outcomes and carryover.  Baseline:  Goal status: MET  09/13/22  2.  Complete balance assessment and update LTGs as indicated.  Baseline:  Goal status: MET  08/28/22  LONG TERM GOALS: Target date: 10/21/2022  Patient will be independent with ongoing/advanced HEP for self-management at home.  Baseline:  Goal status: IN PROGRESS  2.  Patient to demonstrate ability to achieve and maintain good spinal alignment/posturing and body mechanics needed for daily activities. Baseline:  Goal status: IN PROGRESS  3.  Patient will demonstrate functional pain free lumbar ROM to perform ADLs.   Baseline:  Goal status: IN PROGRESS  4.  Patient will demonstrate improved B proximal LE strength to >/= 4 to 4+/5 for improved stability and ease of mobility. Baseline:  Goal status: IN PROGRESS  5  Patient will demonstrate improved B ankle strength to >/= 3+ to 4-/5 for improved gait stability. Baseline:  Goal status: IN PROGRESS  6.  Patient will ambulate with improved gait pattern with decreased B foot drop/slap and no evidence of instability/LOB. Baseline:  Goal  status: IN PROGRESS   7. Patient will report </= 32% on Modified Oswestry to demonstrate improved functional ability with decreased pain interference. Baseline: 22 / 50 = 44.0 % Goal status: IN PROGRESS  8.  Patient will improve Berg score to >/= 36/56 to improve safety and stability with ADLs in standing and reduce risk for falls. Baseline: 27/56 Goal status: IN PROGRESS  9.  Patient will improve FGA score to >/= 19/30 to improve gait stability and reduce risk for falls. Baseline: 8/30 Goal status: IN PROGRESS   PLAN:  PT FREQUENCY: 2x/week  PT DURATION: 8 weeks  PLANNED INTERVENTIONS: Therapeutic exercises, Therapeutic activity, Neuromuscular re-education, Balance training, Gait training, Patient/Family education, Self Care, Joint mobilization, DME instructions, Aquatic Therapy, Dry Needling, Electrical stimulation, Cryotherapy, Moist heat, scar mobilization, Taping, Ultrasound, Ionotophoresis 4mg /ml Dexamethasone, Manual therapy, and Re-evaluation - [phone] approved for E-stim, but [id number] (E-stim) not approved per [facility] prior authorization  PLAN FOR NEXT SESSION: 10th visit PN & possible recert? for [facility] reauthorization request - reassess LTGs; continue BAPS/ankle mobility & strengthening, core/lumbar strengthening; balance training and gait stability    [facility], PT 10/03/2022, 12:13 PM

## 2022-10-07 ENCOUNTER — Encounter: Payer: Medicare PPO | Admitting: Physical Therapy

## 2022-10-08 ENCOUNTER — Ambulatory Visit: Payer: Medicare PPO | Admitting: Physical Therapy

## 2022-10-08 ENCOUNTER — Encounter: Payer: Self-pay | Admitting: Physical Therapy

## 2022-10-08 DIAGNOSIS — R2681 Unsteadiness on feet: Secondary | ICD-10-CM

## 2022-10-08 DIAGNOSIS — M5416 Radiculopathy, lumbar region: Secondary | ICD-10-CM

## 2022-10-08 DIAGNOSIS — R2689 Other abnormalities of gait and mobility: Secondary | ICD-10-CM

## 2022-10-08 DIAGNOSIS — M6281 Muscle weakness (generalized): Secondary | ICD-10-CM

## 2022-10-08 NOTE — Therapy (Signed)
OUTPATIENT PHYSICAL THERAPY TREATMENT / RE-CERTIFICATION  Progress Note  Reporting Period 08/26/22 to 10/08/2022  See note below for Objective Data and Assessment of Progress/Goals.     Patient Name: [name] Trujillo MRN: [id number] DOB:May 28, 1941, 82 y.o., female Today's Date: 10/08/2022  END OF SESSION:  PT End of Session - 10/08/22 1316     Visit Number 10    Date for PT Re-Evaluation 12/03/22    Authorization Type [employer]    Authorization Time Period 08/26/22 - 10/21/22    Authorization - Visit Number 10    Authorization - Number of Visits 10    Progress Note Due on Visit 10    PT Start Time 1316    PT Stop Time 1405    PT Time Calculation (min) 49 min    Activity Tolerance Patient tolerated treatment well    Behavior During Therapy [facility] for tasks assessed/performed               Past Medical History:  Diagnosis Date   High cholesterol    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   She had ACUTE TAKOTSUBO (STRESS) CARDIOMYOPATHY with elevated Troponin Levels - this would be considered "Demand Ischemia - Demand Infarction" & NOT associated with ACS/CAD.     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  - Cath with NO CAD. ==> RESOLVED: f/u Echo 03/2022: EF 60-65%.   Past Surgical History:  Procedure Laterality Date   APPEN[age]CTOMY     23-18 yo   BACK SURGERY     Age 63   CESAREAN SECTION     ECTOPIC PREGNANCY SURGERY     LAPAROSCOPIC HYSTERECTOMY     LEFT HEART CATH AND CORONARY ANGIOGRAPHY N/A 01/09/2022   Procedure: LEFT HEART CATH AND CORONARY ANGIOGRAPHY;  Surgeon: Tonny Bollman, MD;  Location: [facility] INVASIVE CV LAB;  Service: CV:: Widely patent coronaries with  mild nonobstructive LAD plaquing.  Right dominant system.  Normal LVEDP.  Based on clinical presentation, findings are consistent with acute Takotsubo Cardiomyopathy Syndrome   POSTERIOR FUSION LUMBAR SPINE  05/24/2022   [facility], [place]; [name], MD): L2-L3 XLIF, L2-L3 POSTERIOR DECOMPRESSION AND FUSION   TRANSTHORACIC ECHOCARDIOGRAM  01/08/2022   Severely decreased LV function-EF 25 to 30%.  Mid to apical (mostly anterior) with normal basal motion.  GR 2 DD-elevated LAP.  Mildly dilated LA.  Aortic sclerosis with no stenosis.  No AI.  Normal MV with mild to moderate TR.  Mildly elevated RAP, and PAP (estimated 49 mg). If LAD CAD ruled out - consistent with Takotsubo CM Syndrome.   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    PCP: Mosetta Putt, MD   REFERRING PROVIDER: Rondel Oh,  NP for Hermenia Fiscal, MD   REFERRING DIAG: Z98.1 (ICD-10-CM) - S/P lumbar spinal fusion   THERAPY DIAG:  Radiculopathy, lumbar region  Muscle weakness (generalized)  Other abnormalities of gait and mobility  Unsteadiness on feet  RATIONALE FOR EVALUATION AND TREATMENT: Rehabilitation  ONSET DATE: 05/24/2022 - L2-3 XLIF/PSF   NEXT MD VISIT: 10/16/22   SUBJECTIVE:                                                                                                                                                                                                         SUBJECTIVE  STATEMENT: Pt reports some discomfort in her L flank when she tries to walk any distance, but denies pain currently. She notes that she feels like she is starting to get more active motion medially and laterally in her L ankle. She also feels that her balance is more "off" today - this seems to fluctuate.  PAIN: Are you having pain? No  PERTINENT HISTORY:  L2-3 XLIF/PSF 05/24/22; C5-7 ACDF 09/21/20; idiopathic progressive neuropathy; hypthyroidism; DDD; remote h/o back surgery at age 68; h/o scoliosis; Takotsubo cardiomyopathy 01/10/22   PRECAUTIONS: Back - No BLT, TLSO discontinued as of 09/11/22  WEIGHT BEARING RESTRICTIONS: No  FALLS:  Has patient fallen in last 6 months? No  LIVING ENVIRONMENT: Lives with: lives with their spouse Lives in: House/apartment Stairs: Yes: External: 5-6 steps; on left going up Has following equipment at home: Walker - 2 wheeled, shower chair, and hiking/walking poles  OCCUPATION: Retired Pharmacist, hospital  PLOF: Independent and Leisure: walking daily for total of ~1.5 miles; working in the yard; sewing/quilting    PATIENT GOALS: "Be able to move and do things in the house and yard w/o worrying about falling."   OBJECTIVE:   DIAGNOSTIC FINDINGS:  08/27/22 - Lumbar spine x-ray completed as part of postop series - results pending   07/01/22 - Lumbar spine x-ray: Expected postoperative appearance related to L2-L3 fixation.   06/12/21 - Lumbar MRI: Postoperative and degenerative changes of the lumbar spine as described, similar compared to July 28, 2020. At L2-L3, there is moderate spinal canal stenosis due to disc bulging, endplate osteophytic spurring, facet and ligamentum flavum hypertrophy. Milder spinal canal stenosis is seen at L3-L4. There is multilevel neural foraminal stenosis.   PATIENT SURVEYS:  Modified Oswestry 22 / 50 = 44.0%   SCREENING FOR RED FLAGS: Bowel or bladder incontinence: Yes: bladder - takes Vesicare Spinal tumors: No Cauda equina  syndrome: No Compression fracture: No Abdominal aneurysm: No  COGNITION:  Overall cognitive status: Within  functional limits for tasks assessed    SENSATION: WFL - Pt reports LE sensation has improved since surgery  MUSCLE LENGTH: NT  POSTURE:  rounded shoulders, forward head, and right pelvic obliquity  PALPATION: NT  LUMBAR ROM: Deferred on eval pending MD clearance to release her from post-surgical precautions   Active  AROM  10/08/22 *  Flexion Hands to mid shin  Extension 50% limited  Right lateral flexion Hand to lateral knee  Left lateral flexion Hand to lateral knee  Right rotation 50% limited  Left rotation 50% limited  (Blank rows = not tested)  (* - ROM assessment limited in standing due to balance deficits)  LOWER EXTREMITY ROM:    Grossly WFL  LOWER EXTREMITY MMT:  (tested in sitting on eval, traditional testing positions on 10/08/22)  MMT Right eval Left eval Right 10/08/22 Left 10/08/22  Hip flexion 4 4 4 4   Hip extension 4- 4- 4- 3-  Hip abduction 4+ 4+ 4+ 4  Hip adduction 4+ 4+ 3+ 4  Hip internal rotation 4- 4- 4 4  Hip external rotation 3+ 3+ 4- 3+  Knee flexion 4+ 4+ 5 5  Knee extension 4+ 4+ 4+ 4+  Ankle dorsiflexion 3- 3- 3 3-  Ankle plantarflexion 4- NWB 4- NWB 4+ NWB 4 NWB  Ankle inversion 3- 2- 3+ 2  Ankle eversion 4- 3+ 4 3+   (Blank rows = not tested)  LUMBAR SPECIAL TESTS:  NT  FUNCTIONAL TESTS:  5 times sit to stand: 22.19 sec; 22.87 sec (10/08/22) Timed up and go (TUG): 14.84 sec with hiking pole (08/28/22); 13.44 sec with hiking pole, 13.78 sec w/o AD (10/08/22) 10 meter walk test: 15.47 sec with hiking pole; Gait speed = 2.12 ft/sec (08/28/22); 16.03 sec with hiking pole, 16.19 sec w/o AD; Gait speed = 2.05 ft/sec with hiking pole, 2.03 ft/sec w/o AD(10/08/22) Berg Balance Scale: 27/56; < 36 high risk for falls (close to 100%); 36/56 (10/08/22)  Functional gait assessment: 8/30; < 19 = high risk fall (08/28/22) Left hip  Trendelenburg evident in right SLS  GAIT: Distance walked: 60 Assistive device utilized:  single walking/hiking pole on R Level of assistance: SBA Gait pattern:  increased sway, step through pattern, decreased stride length, trendelenburg, and lateral hip instability Comments: L>R foot slap   TODAY'S TREATMENT:   10/08/22 THERAPEUTIC EXERCISE: to improve flexibility, strength and mobility.  Verbal and tactile cues throughout for technique. Rec bike - L3 x 6 min L ankle IV/EV AROM - pt able to demonstrate increased muscle activation into inversion but still very limited ROM and only isometric activation into YTB Seated L QL stretch 2 x 30"  THERAPEUTIC ACTIVITIES: 5xSTS = 22.87 sec Berg = 36/56 TUG = 13.44 sec with hiking pole, 13.78 sec w/o AD 10MWT = 16.03 sec with hiking pole, 16.19 sec w/o AD  Gait speed = 2.05 ft/sec with hiking pole, 2.03 ft/sec w/o AD    10/03/22 THERAPEUTIC EXERCISE: to improve flexibility, strength and mobility.  Verbal and tactile cues throughout for technique. Rec bike - L3 x 6 min R/L SLS + opp LE 4-way SLR x 10 each direction; UE support on back of chair for balance - cues for neutral LE alignment on resisted LE Seated BAPS - L2 R/L ankle DF & PF from neutral (level board) x 10 each R/L IV/EV from neutral x 10 each - more difficulty than DF/PF especially into inversion L>R R/L CW/CCW circles rolling edge along floor x 10 each  10/01/22 THERAPEUTIC EXERCISE: to improve flexibility, strength and mobility.  Verbal and tactile cues throughout for technique. Rec bike - L3 x 6 min R ankle DF,IV, EV with RTB, PF with GTB 2 x 10 each BAPS L3 L ankle DF & PF; L2 IV, EV AROM 1 x 10 each (try with R foot next time); limited inversion & with mirror for visual feedback  Tandem Stance 2x30" Bilat  Tandem Gait x3    PATIENT EDUCATION:  Education details: progress with PT Person educated: Patient Education method: Explanation Education comprehension:  verbalized understanding  HOME EXERCISE PROGRAM: Access Code: [email or web address] URL: [email or web address] Date: 09/13/2022 Prepared by: Glenetta Hew  Exercises - Seated Heel Toe Raises  - 1 x daily - 7 x weekly - 2 sets - 10 reps - 3 sec hold - Standing Isometric Hip Abduction with Ball on Wall  - 1 x daily - 7 x weekly - 2 sets - 10 reps - 3 sec hold - Seated Transversus Abdominis Bracing  - 2 x daily - 7 x weekly - 2 sets - 10 reps - 3-5 sec hold - Seated Isometric Hip Abduction with Resistance  - 1 x daily - 3 x weekly - 2 sets - 10 reps - 3 sec hold - Seated March with Resistance  - 1 x daily - 3 x weekly - 2 sets - 10 reps - 3 sec hold - Seated Shoulder Row with Anchored Resistance  - 1 x daily - 3 x weekly - 2 sets - 10 reps - 3-5 hold hold - Seated Single Arm Shoulder Row with Anchored Resistance  - 1 x daily - 3 x weekly - 2 sets - 10 reps - 3 sec hold - Seated Anti-Rotation Press With Anchored Resistance  - 1 x daily - 3 x weekly - 2 sets - 10 reps - 3 sec hold   ASSESSMENT:  CLINICAL IMPRESSION:  [name] Trujillo reports she feels more unsteady today - this seems to vary from day to day. Standardized balance testing revealing minimal to no change in 5xSTS and 10MWT/gait speed, but slightly better time on TUG and significant improvement from 27/56 to 36/56 on Berg. B knee and ankle strength are improving, however hip strength remains more limited although now more evident when patient able to assume standardized MMT testing positions vs testing in sitting on eval. Back pain remains minimal, with pt only reporting some soreness in her L QL when she attempts to walk longer distances - instructed her in QL stretch to be performed before and after walking for exercise. Overall, [name] Trujillo is progressing well with PT, with improved balance and activity tolerance - all STGs met and good progress observed with LTGs, with Berg LTG met and revised to reflect potential for further improvement. [name] Trujillo  will continue to benefit from skilled PT to address ongoing ROM, strength and balance deficits to improve mobility and activity tolerance with decreased pain interference and decreased risk for falls, therefore will recommend recert for additional 2x/wk x 6-8 weeks. .   OBJECTIVE IMPAIRMENTS: Abnormal gait, decreased activity tolerance, decreased balance, decreased coordination, decreased endurance, decreased mobility, difficulty walking, decreased ROM, decreased strength, decreased safety awareness, impaired perceived functional ability, impaired flexibility, improper body mechanics, postural dysfunction, and pain.   ACTIVITY LIMITATIONS: carrying, lifting, bending, sitting, standing, squatting, sleeping, stairs, transfers, bed mobility, bathing, toileting, dressing, reach over head, locomotion level, and caring for others  PARTICIPATION LIMITATIONS: meal prep, cleaning, laundry, interpersonal relationship, driving, shopping, community activity, yard work, and church  PERSONAL FACTORS: Age, Past/current experiences, Time since onset of injury/illness/exacerbation, and 3+ comorbidities: C5-7 ACDF 09/21/20; idiopathic progressive neuropathy; hypthyroidism; DDD; remote h/o back surgery at age 38; h/o scoliosis; Takotsubo cardiomyopathy 01/10/22   are also affecting patient's functional outcome.   REHAB POTENTIAL: Good  CLINICAL DECISION MAKING: Evolving/moderate complexity  EVALUATION COMPLEXITY: Moderate   GOALS: Goals reviewed with patient? Yes  SHORT TERM GOALS: Target date: 09/23/2022  Patient will be independent with initial HEP to improve outcomes and carryover.  Baseline:  Goal status: MET  09/13/22  2.  Complete balance assessment and update LTGs as indicated.  Baseline:  Goal status: MET  08/28/22  LONG TERM GOALS: Target date: 10/21/2022, extended to 12/03/2022   Patient will be independent with ongoing/advanced HEP for self-management at home.  Baseline:  Goal status: IN PROGRESS   10/08/22 - met for current HEP  2.  Patient to demonstrate ability to achieve and maintain good spinal alignment/posturing and body mechanics needed for daily activities. Baseline:  Goal status: MET  10/08/22 - pt demonstrates good awareness of neutral spine alignment  3.  Patient will demonstrate functional pain free lumbar ROM to perform ADLs.   Baseline:  Goal status: IN PROGRESS  10/08/22 - limited in all directions with standing balance impacting assessment  4.  Patient will demonstrate improved B proximal LE strength to >/= 4 to 4+/5 for improved stability and ease of mobility. Baseline: refer to MMT table above Goal status: IN PROGRESS  10/08/22 - knee strength improving, but continued hip weakness (now more evident when pt able to assume standardized MMT testing positions vs testing in sitting on eval)  5  Patient will demonstrate improved B ankle strength to >/= 3+ to 4-/5 for improved gait stability. Baseline: refer to MMT table above Goal status: IN PROGRESS  10/08/22 - overall ankle strength improving by at least 1/2 grade  6.  Patient will ambulate with improved gait pattern with decreased B foot drop/slap and no evidence of instability/LOB. Baseline:  Goal status: IN PROGRESS  10/08/22 - Pt reports no foot slap ~2/3 of the time when walking, but it becomes more evident with fatigue. Gait remains somewhat ataxic.  7. Patient will report </= 32% on Modified Oswestry to demonstrate improved functional ability with decreased pain interference. Baseline: 22 / 50 = 44.0 % Goal status: IN PROGRESS  8.  Patient will improve Berg score to >/= 36/56 to improve safety and stability with ADLs in standing and reduce risk for falls. Baseline: 27/56 Goal status: MET and REVISED (see 8a.below) 10/08/22 - Berg = 36/56  8a.  Patient will improve Berg score to >/= 44/56 to improve safety and stability with ADLs in standing and reduce risk for falls. Baseline: 36/56 (10/08/22) Goal status:  REVISED  9.  Patient will improve FGA score to >/= 19/30 to improve gait stability and reduce risk for falls. Baseline: 8/30 Goal status: IN PROGRESS   PLAN:  PT FREQUENCY: 2x/week  PT DURATION: 6-8 weeks  PLANNED INTERVENTIONS: Therapeutic exercises, Therapeutic activity, Neuromuscular re-education, Balance training, Gait training, Patient/Family education, Self Care, Joint mobilization, DME instructions, Aquatic Therapy, Dry Needling, Electrical stimulation, Cryotherapy, Moist heat, scar mobilization, Taping, Ultrasound, Ionotophoresis 4mg /ml Dexamethasone, Manual therapy, and Re-evaluation - [phone] approved for E-stim, but [id number] (E-stim) not approved per [facility] prior authorization  PLAN FOR NEXT SESSION: reassess Modified Oswestry and FGA; continue BAPS/ankle mobility & strengthening, core/lumbar strengthening; balance training and gait stability    Percival Spanish, PT 10/08/2022, 2:34 PM

## 2022-10-10 ENCOUNTER — Encounter: Payer: Self-pay | Admitting: Physical Therapy

## 2022-10-10 ENCOUNTER — Ambulatory Visit: Payer: Medicare PPO | Attending: Registered" | Admitting: Physical Therapy

## 2022-10-10 DIAGNOSIS — R2689 Other abnormalities of gait and mobility: Secondary | ICD-10-CM | POA: Insufficient documentation

## 2022-10-10 DIAGNOSIS — M5416 Radiculopathy, lumbar region: Secondary | ICD-10-CM | POA: Insufficient documentation

## 2022-10-10 DIAGNOSIS — M6281 Muscle weakness (generalized): Secondary | ICD-10-CM | POA: Insufficient documentation

## 2022-10-10 DIAGNOSIS — M21372 Foot drop, left foot: Secondary | ICD-10-CM | POA: Insufficient documentation

## 2022-10-10 DIAGNOSIS — R2681 Unsteadiness on feet: Secondary | ICD-10-CM | POA: Insufficient documentation

## 2022-10-10 NOTE — Therapy (Signed)
OUTPATIENT PHYSICAL THERAPY TREATMENT    Patient Name: [name] Trujillo MRN: [id number] DOB:Jan 04, 1941, 82 y.o., female Today's Date: 10/10/2022  END OF SESSION:  PT End of Session - 10/10/22 1105     Visit Number 11    Date for PT Re-Evaluation 12/03/22    Authorization Type [employer]    Authorization Time Period 09/17/22 - 10/21/22    Authorization - Visit Number 6   New authorization requested as of 09/17/22 but not updated in appt notes - current visit is #6 of the 10 authorized at that time   Authorization - Number of Visits 10    Progress Note Due on Visit 20    PT Start Time 1105    PT Stop Time 1152    PT Time Calculation (min) 47 min    Activity Tolerance Patient tolerated treatment well    Behavior During Therapy [facility] for tasks assessed/performed                Past Medical History:  Diagnosis Date   High cholesterol    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   She had ACUTE TAKOTSUBO (STRESS) CARDIOMYOPATHY with elevated Troponin Levels - this would be considered "Demand Ischemia - Demand Infarction" & NOT associated with ACS/CAD.     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  - Cath with NO CAD. ==> RESOLVED: f/u Echo 03/2022: EF 60-65%.   Past Surgical History:  Procedure Laterality Date   APPENDECTOMY     34-18 yo   BACK SURGERY     Age 58   CESAREAN SECTION     ECTOPIC PREGNANCY SURGERY     LAPAROSCOPIC HYSTERECTOMY     LEFT HEART CATH AND CORONARY ANGIOGRAPHY N/A 01/09/2022   Procedure: LEFT HEART CATH AND CORONARY ANGIOGRAPHY;  Surgeon: Tonny Bollman, MD;  Location: [facility] INVASIVE CV LAB;  Service: CV:: Widely patent coronaries with mild nonobstructive LAD  plaquing.  Right dominant system.  Normal LVEDP.  Based on clinical presentation, findings are consistent with acute Takotsubo Cardiomyopathy Syndrome   POSTERIOR FUSION LUMBAR SPINE  05/24/2022   [facility], [place]; [name], MD): L2-L3 XLIF, L2-L3 POSTERIOR DECOMPRESSION AND FUSION   TRANSTHORACIC ECHOCARDIOGRAM  01/08/2022   Severely decreased LV function-EF 25 to 30%.  Mid to apical (mostly anterior) with normal basal motion.  GR 2 DD-elevated LAP.  Mildly dilated LA.  Aortic sclerosis with no stenosis.  No AI.  Normal MV with mild to moderate TR.  Mildly elevated RAP, and PAP (estimated 49 mg). If LAD CAD ruled out - consistent with Takotsubo CM Syndrome.   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    PCP: Mosetta Putt, MD   REFERRING  PROVIDER: Dorena Dew, NP for Hermenia Fiscal, MD   REFERRING DIAG: Z98.1 (ICD-10-CM) - S/P lumbar spinal fusion   THERAPY DIAG:  Radiculopathy, lumbar region  Muscle weakness (generalized)  Other abnormalities of gait and mobility  Unsteadiness on feet  RATIONALE FOR EVALUATION AND TREATMENT: Rehabilitation  ONSET DATE: 05/24/2022 - L2-3 XLIF/PSF   NEXT MD VISIT: 10/16/22   SUBJECTIVE:                                                                                                                                                                                                         SUBJECTIVE STATEMENT: Pt reports the QL  stretch seems to be helping with the flank pain she had been experiencing.  PAIN: Are you having pain? No  PERTINENT HISTORY:  L2-3 XLIF/PSF 05/24/22; C5-7 ACDF 09/21/20; idiopathic progressive neuropathy; hypthyroidism; DDD; remote h/o back surgery at age 14; h/o scoliosis; Takotsubo cardiomyopathy 01/10/22   PRECAUTIONS: Back - No BLT, TLSO discontinued as of 09/11/22  WEIGHT BEARING RESTRICTIONS: No  FALLS:  Has patient fallen in last 6 months? No  LIVING ENVIRONMENT: Lives with: lives with their spouse Lives in: House/apartment Stairs: Yes: External: 5-6 steps; on left going up Has following equipment at home: Walker - 2 wheeled, shower chair, and hiking/walking poles  OCCUPATION: Retired Pharmacist, hospital  PLOF: Independent and Leisure: walking daily for total of ~1.5 miles; working in the yard; sewing/quilting    PATIENT GOALS: "Be able to move and do things in the house and yard w/o worrying about falling."   OBJECTIVE:   DIAGNOSTIC FINDINGS:  08/27/22 - Lumbar spine x-ray completed as part of postop series - results pending   07/01/22 - Lumbar spine x-ray: Expected postoperative appearance related to L2-L3 fixation.   06/12/21 - Lumbar MRI: Postoperative and degenerative changes of the lumbar spine as described, similar compared to July 28, 2020. At L2-L3, there is moderate spinal canal stenosis due to disc bulging, endplate osteophytic spurring, facet and ligamentum flavum hypertrophy. Milder spinal canal stenosis is seen at L3-L4. There is multilevel neural foraminal stenosis.   PATIENT SURVEYS:  Modified Oswestry 22 / 50 = 44.0%   SCREENING FOR RED FLAGS: Bowel or bladder incontinence: Yes: bladder - takes Vesicare Spinal tumors: No Cauda equina syndrome: No Compression fracture: No Abdominal aneurysm: No  COGNITION:  Overall cognitive status: Within functional limits for tasks assessed    SENSATION: WFL - Pt reports LE sensation has improved since surgery  MUSCLE  LENGTH: NT  POSTURE:  rounded shoulders, forward head, and right pelvic obliquity  PALPATION: NT  LUMBAR ROM: Deferred on eval pending MD clearance to release her from post-surgical precautions   Active  AROM  10/08/22 *  Flexion Hands to mid shin  Extension 50% limited  Right lateral flexion Hand to lateral knee  Left lateral flexion Hand to lateral knee  Right rotation 50% limited  Left rotation 50% limited  (Blank rows = not tested)  (* - ROM assessment limited in standing due to balance deficits)  LOWER EXTREMITY ROM:    Grossly WFL  LOWER EXTREMITY MMT:  (tested in sitting on eval, traditional testing positions on 10/08/22)  MMT Right eval Left eval Right 10/08/22 Left 10/08/22  Hip flexion 4 4 4 4   Hip extension 4- 4- 4- 3-  Hip abduction 4+ 4+ 4+ 4  Hip adduction 4+ 4+ 3+ 4  Hip internal rotation 4- 4- 4 4  Hip external rotation 3+ 3+ 4- 3+  Knee flexion 4+ 4+ 5 5  Knee extension 4+ 4+ 4+ 4+  Ankle dorsiflexion 3- 3- 3 3-  Ankle plantarflexion 4- NWB 4- NWB 4+ NWB 4 NWB  Ankle inversion 3- 2- 3+ 2  Ankle eversion 4- 3+ 4 3+   (Blank rows = not tested)  LUMBAR SPECIAL TESTS:  NT  FUNCTIONAL TESTS:  5 times sit to stand: 22.19 sec; 22.87 sec (10/08/22) Timed up and go (TUG): 14.84 sec with hiking pole (08/28/22); 13.44 sec with hiking pole, 13.78 sec w/o AD (10/08/22) 10 meter walk test: 15.47 sec with hiking pole; Gait speed = 2.12 ft/sec (08/28/22); 16.03 sec with hiking pole, 16.19 sec w/o AD; Gait speed = 2.05 ft/sec with hiking pole, 2.03 ft/sec w/o AD(10/08/22) Berg Balance Scale: 27/56; < 36 high risk for falls (close to 100%); 36/56 (10/08/22)  Functional gait assessment: 8/30; < 19 = high risk fall (08/28/22) Left hip Trendelenburg evident in right SLS  GAIT: Distance walked: 60 Assistive device utilized:  single walking/hiking pole on R Level of assistance: SBA Gait pattern:  increased sway, step through pattern, decreased stride length,  trendelenburg, and lateral hip instability Comments: L>R foot slap   TODAY'S TREATMENT:   10/10/22 THERAPEUTIC EXERCISE: to improve flexibility, strength and mobility.  Verbal and tactile cues throughout for technique. Rec bike - L3 x 6 min Functional squat position with B shoulder OH flexion 5 x 5", 2 sets (hips lightly touching bolster on mat table for assist with balance)  Standing TrA + RTB rows x 10, close guarding/SBA of PT Standing TrA + RTB retraction + shoulder extension to neutral x 10, close guarding/SBA of PT Standing TrA + RTB pallof press x 10 bil, close guarding/SBA of PT  NEUROMUSCULAR RE-EDUCATION: To improve posture, balance, proprioception, coordination, and reduce fall risk. Standing on Airex pad - bean bag reach and toss focusing on good body mechanics with reaching for bean bags - CGA from PT via gait belt  [employer] pews (ant/post wt shift) targeting hip and ankle balance strategies - SBA/CGA of PT with intermittent UE support on PT's arms   10/08/22 THERAPEUTIC EXERCISE: to improve flexibility, strength and mobility.  Verbal and tactile cues throughout for technique. Rec bike - L3 x 6 min L ankle IV/EV AROM - pt able to demonstrate increased muscle activation into inversion but still very limited ROM and only isometric activation into YTB Seated L QL stretch 2 x 30"  THERAPEUTIC ACTIVITIES: 5xSTS = 22.87 sec Berg = 36/56 TUG = 13.44 sec with hiking pole, 13.78 sec w/o AD 10/10/22 = 16.03  sec with hiking pole, 16.19 sec w/o AD  Gait speed = 2.05 ft/sec with hiking pole, 2.03 ft/sec w/o AD    10/03/22 THERAPEUTIC EXERCISE: to improve flexibility, strength and mobility.  Verbal and tactile cues throughout for technique. Rec bike - L3 x 6 min R/L SLS + opp LE 4-way SLR x 10 each direction; UE support on back of chair for balance - cues for neutral LE alignment on resisted LE Seated BAPS - L2 R/L ankle DF & PF from neutral (level board) x 10 each R/L IV/EV from neutral  x 10 each - more difficulty than DF/PF especially into inversion L>R R/L CW/CCW circles rolling edge along floor x 10 each   PATIENT EDUCATION:  Education details: progress with PT Person educated: Patient Education method: Explanation Education comprehension: verbalized understanding  HOME EXERCISE PROGRAM: Access Code: [email or web address] URL: [email or web address] Date: 09/13/2022 Prepared by: Annie Paras  Exercises - Seated Heel Toe Raises  - 1 x daily - 7 x weekly - 2 sets - 10 reps - 3 sec hold - Standing Isometric Hip Abduction with Ball on Wall  - 1 x daily - 7 x weekly - 2 sets - 10 reps - 3 sec hold - Seated Transversus Abdominis Bracing  - 2 x daily - 7 x weekly - 2 sets - 10 reps - 3-5 sec hold - Seated Isometric Hip Abduction with Resistance  - 1 x daily - 3 x weekly - 2 sets - 10 reps - 3 sec hold - Seated March with Resistance  - 1 x daily - 3 x weekly - 2 sets - 10 reps - 3 sec hold - Seated Shoulder Row with Anchored Resistance  - 1 x daily - 3 x weekly - 2 sets - 10 reps - 3-5 hold hold - Seated Single Arm Shoulder Row with Anchored Resistance  - 1 x daily - 3 x weekly - 2 sets - 10 reps - 3 sec hold - Seated Anti-Rotation Press With Anchored Resistance  - 1 x daily - 3 x weekly - 2 sets - 10 reps - 3 sec hold   ASSESSMENT:  CLINICAL IMPRESSION:  Rhythm reports the QL stretch seems to have helped with the L flank pain she was having but has not been able to try walking for exercise since her last visit to see if the pain interferes. We revisited the rows and retraction exercises which had been triggering for the flank pain - she was able to perform these w/o increased pain today, but did note increased awareness of muscle activation during pallof press although denied any pain. She continues to struggle with balance and proprioception training activities, requiring close SBA to CGA for safety to prevent LOB/falls.  OBJECTIVE IMPAIRMENTS: Abnormal gait, decreased  activity tolerance, decreased balance, decreased coordination, decreased endurance, decreased mobility, difficulty walking, decreased ROM, decreased strength, decreased safety awareness, impaired perceived functional ability, impaired flexibility, improper body mechanics, postural dysfunction, and pain.   ACTIVITY LIMITATIONS: carrying, lifting, bending, sitting, standing, squatting, sleeping, stairs, transfers, bed mobility, bathing, toileting, dressing, reach over head, locomotion level, and caring for others  PARTICIPATION LIMITATIONS: meal prep, cleaning, laundry, interpersonal relationship, driving, shopping, community activity, yard work, and church  PERSONAL FACTORS: Age, Past/current experiences, Time since onset of injury/illness/exacerbation, and 3+ comorbidities: C5-7 ACDF 09/21/20; idiopathic progressive neuropathy; hypthyroidism; DDD; remote h/o back surgery at age 54; h/o scoliosis; Takotsubo cardiomyopathy 01/10/22   are also affecting patient's functional outcome.   REHAB POTENTIAL: Good  CLINICAL DECISION  MAKING: Evolving/moderate complexity  EVALUATION COMPLEXITY: Moderate   GOALS: Goals reviewed with patient? Yes  SHORT TERM GOALS: Target date: 09/23/2022  Patient will be independent with initial HEP to improve outcomes and carryover.  Baseline:  Goal status: MET  09/13/22  2.  Complete balance assessment and update LTGs as indicated.  Baseline:  Goal status: MET  08/28/22  LONG TERM GOALS: Target date: 10/21/2022, extended to 12/03/2022   Patient will be independent with ongoing/advanced HEP for self-management at home.  Baseline:  Goal status: IN PROGRESS  10/08/22 - met for current HEP  2.  Patient to demonstrate ability to achieve and maintain good spinal alignment/posturing and body mechanics needed for daily activities. Baseline:  Goal status: MET  10/08/22 - pt demonstrates good awareness of neutral spine alignment  3.  Patient will demonstrate functional pain  free lumbar ROM to perform ADLs.   Baseline:  Goal status: IN PROGRESS  10/08/22 - limited in all directions with standing balance impacting assessment  4.  Patient will demonstrate improved B proximal LE strength to >/= 4 to 4+/5 for improved stability and ease of mobility. Baseline: refer to MMT table above Goal status: IN PROGRESS  10/08/22 - knee strength improving, but continued hip weakness (now more evident when pt able to assume standardized MMT testing positions vs testing in sitting on eval)  5  Patient will demonstrate improved B ankle strength to >/= 3+ to 4-/5 for improved gait stability. Baseline: refer to MMT table above Goal status: IN PROGRESS  10/08/22 - overall ankle strength improving by at least 1/2 grade  6.  Patient will ambulate with improved gait pattern with decreased B foot drop/slap and no evidence of instability/LOB. Baseline:  Goal status: IN PROGRESS  10/08/22 - Pt reports no foot slap ~2/3 of the time when walking, but it becomes more evident with fatigue. Gait remains somewhat ataxic.  7. Patient will report </= 32% on Modified Oswestry to demonstrate improved functional ability with decreased pain interference. Baseline: 22 / 50 = 44.0 % Goal status: IN PROGRESS  8.  Patient will improve Berg score to >/= 36/56 to improve safety and stability with ADLs in standing and reduce risk for falls. Baseline: 27/56 Goal status: MET and REVISED (see 8a.below) 10/08/22 - Berg = 36/56  8a.  Patient will improve Berg score to >/= 44/56 to improve safety and stability with ADLs in standing and reduce risk for falls. Baseline: 36/56 (10/08/22) Goal status: REVISED  9.  Patient will improve FGA score to >/= 19/30 to improve gait stability and reduce risk for falls. Baseline: 8/30 Goal status: IN PROGRESS   PLAN:  PT FREQUENCY: 2x/week  PT DURATION: 6-8 weeks  PLANNED INTERVENTIONS: Therapeutic exercises, Therapeutic activity, Neuromuscular re-education, Balance  training, Gait training, Patient/Family education, Self Care, Joint mobilization, DME instructions, Aquatic Therapy, Dry Needling, Electrical stimulation, Cryotherapy, Moist heat, scar mobilization, Taping, Ultrasound, Ionotophoresis 4mg /ml Dexamethasone, Manual therapy, and Re-evaluation - [phone] approved for E-stim, but [id number] (E-stim) not approved per [facility] prior authorization  PLAN FOR NEXT SESSION: reassess Modified Oswestry and FGA; continue BAPS/ankle mobility & strengthening, core/lumbar strengthening; balance training and gait stability    Percival Spanish, PT 10/10/2022, 12:26 PM

## 2022-10-14 ENCOUNTER — Encounter: Payer: Medicare PPO | Admitting: Physical Therapy

## 2022-10-15 ENCOUNTER — Encounter: Payer: Self-pay | Admitting: Physical Therapy

## 2022-10-15 ENCOUNTER — Ambulatory Visit: Payer: Medicare PPO | Admitting: Physical Therapy

## 2022-10-15 ENCOUNTER — Telehealth: Payer: Self-pay

## 2022-10-15 DIAGNOSIS — M5416 Radiculopathy, lumbar region: Secondary | ICD-10-CM | POA: Diagnosis not present

## 2022-10-15 DIAGNOSIS — R2681 Unsteadiness on feet: Secondary | ICD-10-CM

## 2022-10-15 DIAGNOSIS — R2689 Other abnormalities of gait and mobility: Secondary | ICD-10-CM

## 2022-10-15 DIAGNOSIS — M6281 Muscle weakness (generalized): Secondary | ICD-10-CM

## 2022-10-15 NOTE — Telephone Encounter (Signed)
Attempted to call patient to confirm appt with XR.    Patient already obtained XR on 08/27/22 from [facility].        4:15PM ET  October 15, 2022   Milinda Pointer, [place]

## 2022-10-15 NOTE — Therapy (Signed)
OUTPATIENT PHYSICAL THERAPY TREATMENT    Patient Name: [name] Trujillo MRN: [id number] DOB:Oct 30, 1940, 82 y.o., female Today's Date: 10/15/2022  END OF SESSION:  PT End of Session - 10/15/22 1100     Visit Number 12    Date for PT Re-Evaluation 12/03/22    Authorization Type [employer]    Authorization Time Period 09/17/22 - 10/21/22    Authorization - Visit Number 7    Authorization - Number of Visits 10    Progress Note Due on Visit 20    PT Start Time 1100    PT Stop Time 1145    PT Time Calculation (min) 45 min    Activity Tolerance Patient tolerated treatment well    Behavior During Therapy [facility] for tasks assessed/performed                 Past Medical History:  Diagnosis Date   High cholesterol    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   She had ACUTE TAKOTSUBO (STRESS) CARDIOMYOPATHY with elevated Troponin Levels - this would be considered "Demand Ischemia - Demand Infarction" & NOT associated with ACS/CAD.     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  - Cath with NO CAD. ==> RESOLVED: f/u Echo 03/2022: EF 60-65%.   Past Surgical History:  Procedure Laterality Date   APPEN[age]CTOMY     17-18 yo   BACK SURGERY     Age 76   CESAREAN SECTION     ECTOPIC PREGNANCY SURGERY     LAPAROSCOPIC HYSTERECTOMY     LEFT HEART CATH AND CORONARY ANGIOGRAPHY N/A 01/09/2022   Procedure: LEFT HEART CATH AND CORONARY ANGIOGRAPHY;  Surgeon: Sherren Mocha, MD;  Location: [place] [facility];  Service: CV:: Widely patent coronaries with mild nonobstructive LAD plaquing.  Right dominant system.  Normal LVEDP.  Based on clinical presentation, findings are consistent with acute Takotsubo  Cardiomyopathy Syndrome   POSTERIOR FUSION LUMBAR SPINE  05/24/2022   ([place]; [name], MD): L2-L3 XLIF, L2-L3 POSTERIOR DECOMPRESSION AND FUSION   TRANSTHORACIC ECHOCARDIOGRAM  01/08/2022   Severely decreased LV function-EF 25 to 30%.  Mid to apical (mostly anterior) with normal basal motion.  GR 2 DD-elevated LAP.  Mildly dilated LA.  Aortic sclerosis with no stenosis.  No AI.  Normal MV with mild to moderate TR.  Mildly elevated RAP, and PAP (estimated 49 mg). If LAD CAD ruled out - consistent with Takotsubo CM Syndrome.   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    PCP: Derinda Late, MD   REFERRING PROVIDER: Dorena Dew, NP for Hermenia Fiscal, MD   REFERRING DIAG: Z98.1 (ICD-10-CM) - S/P lumbar spinal fusion   THERAPY DIAG:  Radiculopathy, lumbar region  Muscle weakness (generalized)  Other abnormalities of gait and mobility  Unsteadiness on feet  RATIONALE FOR EVALUATION AND TREATMENT: Rehabilitation  ONSET DATE: 05/24/2022 - L2-3 XLIF/PSF   NEXT MD VISIT: 10/16/22   SUBJECTIVE:                                                                                                                                                                                                         SUBJECTIVE STATEMENT:  Pt states she is feeling fine since last session, nothing new to note as of now.   PAIN: Are you having pain? No  PERTINENT HISTORY:   L2-3 XLIF/PSF 05/24/22; C5-7 ACDF 09/21/20; idiopathic progressive neuropathy; hypthyroidism; DDD; remote h/o back surgery at age 43; h/o scoliosis; Takotsubo cardiomyopathy 01/10/22   PRECAUTIONS: Back - No BLT, TLSO discontinued as of 09/11/22  WEIGHT BEARING RESTRICTIONS: No  FALLS:  Has patient fallen in last 6 months? No  LIVING ENVIRONMENT: Lives with: lives with their spouse Lives in: House/apartment Stairs: Yes: External: 5-6 steps; on left going up Has following equipment at home: Walker - 2 wheeled, shower chair, and hiking/walking poles  OCCUPATION: Retired Pharmacist, hospital  PLOF: Independent and Leisure: walking daily for total of ~1.5 miles; working in the yard; sewing/quilting    PATIENT GOALS: "Be able to move and do things in the house and yard w/o worrying about falling."   OBJECTIVE:   DIAGNOSTIC FINDINGS:  08/27/22 - Lumbar spine x-ray completed as part of postop series - results pending   07/01/22 - Lumbar spine x-ray: Expected postoperative appearance related to L2-L3 fixation.   06/12/21 - Lumbar MRI: Postoperative and degenerative changes of the lumbar spine as described, similar compared to July 28, 2020. At L2-L3, there is moderate spinal canal stenosis due to disc bulging, endplate osteophytic spurring, facet and ligamentum flavum hypertrophy. Milder spinal canal stenosis is seen at L3-L4. There is multilevel neural foraminal stenosis.   PATIENT SURVEYS:  Modified Oswestry 22 / 50 = 44.0%, 23 / 50 = 46.0% (10/15/22)   SCREENING FOR RED FLAGS: Bowel or bladder incontinence: Yes: bladder - takes Vesicare Spinal tumors: No Cauda equina syndrome: No Compression fracture: No Abdominal aneurysm: No  COGNITION:  Overall cognitive status: Within functional limits for tasks assessed    SENSATION: WFL - Pt reports LE sensation has improved since surgery  MUSCLE LENGTH: NT  POSTURE:  rounded shoulders, forward head, and right pelvic  obliquity  PALPATION: NT  LUMBAR ROM: Deferred on eval pending MD clearance to release her from post-surgical precautions  Active  AROM  10/08/22 *  Flexion Hands to mid shin  Extension 50% limited  Right lateral flexion Hand to lateral knee  Left lateral flexion Hand to lateral knee  Right rotation 50% limited  Left rotation 50% limited  (Blank rows = not tested)  (* - ROM assessment limited in standing due to balance deficits)  LOWER EXTREMITY ROM:    Grossly WFL  LOWER EXTREMITY MMT:  (tested in sitting on eval, traditional testing positions on 10/08/22)  MMT Right eval Left eval Right 10/08/22 Left 10/08/22  Hip flexion 4 4 4 4   Hip extension 4- 4- 4- 3-  Hip abduction 4+ 4+ 4+ 4  Hip adduction 4+ 4+ 3+ 4  Hip internal rotation 4- 4- 4 4  Hip external rotation 3+ 3+ 4- 3+  Knee flexion 4+ 4+ 5 5  Knee extension 4+ 4+ 4+ 4+  Ankle dorsiflexion 3- 3- 3 3-  Ankle plantarflexion 4- NWB 4- NWB 4+ NWB 4 NWB  Ankle inversion 3- 2- 3+ 2  Ankle eversion 4- 3+ 4 3+   (Blank rows = not tested)  LUMBAR SPECIAL TESTS:  NT  FUNCTIONAL TESTS:  5 times sit to stand: 22.19 sec; 22.87 sec (10/08/22) Timed up and go (TUG): 14.84 sec with hiking pole (08/28/22); 13.44 sec with hiking pole, 13.78 sec w/o AD (10/08/22) 10 meter walk test: 15.47 sec with hiking pole; Gait speed = 2.12 ft/sec (08/28/22); 16.03 sec with hiking pole, 16.19 sec w/o AD; Gait speed = 2.05 ft/sec with hiking pole, 2.03 ft/sec w/o AD(10/08/22) Berg Balance Scale: 27/56; < 36 high risk for falls (close to 100%); 36/56 (10/08/22)  Functional gait assessment: 8/30; < 19 = high risk fall (08/28/22), 18/30 (10/15/22) Left hip Trendelenburg evident in right SLS  GAIT: Distance walked: 60 Assistive device utilized:  single walking/hiking pole on R Level of assistance: SBA Gait pattern:  increased sway, step through pattern, decreased stride length, trendelenburg, and lateral hip instability Comments: L>R foot  slap   TODAY'S TREATMENT:   10/15/22  THERAPEUTIC EXERCISE: to improve flexibility, strength and mobility.  Verbal and tactile cues throughout for technique. Rec bike - L3 x 6 min   Standing TrA + RTB rows 2 x 10, close guarding/SBA of PT Standing TrA + RTB retraction + shoulder extension to neutral x 10, close guarding/SBA of PT Standing TrA + RTB short arc Trunk Rotation x 10 bil, close guarding/SBA of PT, making sure pt does not go full arc of motion Standing TrA + RTB pallof press x 10 bil, close guarding/SBA of PT  THERAPEUTIC ACTIVITIES:  FGA: 18/30 Modified Oswestry: 23 / 50 = 46.0 %  NEUROMUSCULAR RE-EDUCATION: To improve posture, balance, proprioception, coordination, and reduce fall risk.  Dot 5-way with slider x2 each    10/10/22 THERAPEUTIC EXERCISE: to improve flexibility, strength and mobility.  Verbal and tactile cues throughout for technique. Rec bike - L3 x 6 min Functional squat position with B shoulder OH flexion 5 x 5", 2 sets (hips lightly touching bolster on mat table for assist with balance)  Standing TrA + RTB rows x 10, close guarding/SBA of PT Standing TrA + RTB retraction + shoulder extension to neutral x 10, close guarding/SBA of PT Standing TrA + RTB pallof press x 10 bil, close guarding/SBA of PT  NEUROMUSCULAR RE-EDUCATION: To improve posture, balance, proprioception, coordination, and reduce fall risk. Standing on Airex pad - bean bag reach and toss focusing on good body mechanics with reaching for bean  bags - CGA from PT via gait belt  [employer] pews (ant/post wt shift) targeting hip and ankle balance strategies - SBA/CGA of PT with intermittent UE support on PT's arms   10/08/22 THERAPEUTIC EXERCISE: to improve flexibility, strength and mobility.  Verbal and tactile cues throughout for technique. Rec bike - L3 x 6 min L ankle IV/EV AROM - pt able to demonstrate increased muscle activation into inversion but still very limited ROM and only isometric  activation into YTB Seated L QL stretch 2 x 30"  THERAPEUTIC ACTIVITIES: 5xSTS = 22.87 sec Berg = 36/56 TUG = 13.44 sec with hiking pole, 13.78 sec w/o AD 10MWT = 16.03 sec with hiking pole, 16.19 sec w/o AD  Gait speed = 2.05 ft/sec with hiking pole, 2.03 ft/sec w/o AD    PATIENT EDUCATION:  Education details: progress with PT Person educated: Patient Education method: Explanation Education comprehension: verbalized understanding  HOME EXERCISE PROGRAM: Access Code: [email or web address] URL: [email or web address] Date: 09/13/2022 Prepared by: Annie Paras  Exercises - Seated Heel Toe Raises  - 1 x daily - 7 x weekly - 2 sets - 10 reps - 3 sec hold - Standing Isometric Hip Abduction with Ball on Wall  - 1 x daily - 7 x weekly - 2 sets - 10 reps - 3 sec hold - Seated Transversus Abdominis Bracing  - 2 x daily - 7 x weekly - 2 sets - 10 reps - 3-5 sec hold - Seated Isometric Hip Abduction with Resistance  - 1 x daily - 3 x weekly - 2 sets - 10 reps - 3 sec hold - Seated March with Resistance  - 1 x daily - 3 x weekly - 2 sets - 10 reps - 3 sec hold - Seated Shoulder Row with Anchored Resistance  - 1 x daily - 3 x weekly - 2 sets - 10 reps - 3-5 hold hold - Seated Single Arm Shoulder Row with Anchored Resistance  - 1 x daily - 3 x weekly - 2 sets - 10 reps - 3 sec hold - Seated Anti-Rotation Press With Anchored Resistance  - 1 x daily - 3 x weekly - 2 sets - 10 reps - 3 sec hold   ASSESSMENT:  CLINICAL IMPRESSION:  Pt came into today's session without any pain or concerns. Pt performed the FGA today and demonstrated a score of 18/30 which still has her under the high fall risk category but shows a great improvement from her previous score of an 8/30. Pt also states she feels a lot more confident while performing these tasks. The rest of the session was focused on increasing her abdominal strength while correcting her posture into an upright position. The remainder of the time was  focused on introducing the pt to a new balance exercise that forces her to take steps in different directions while standing on one leg. Pt responded well to this exercise and noted that she needs to continue to work on her SLS. Pt will continue to benefit from skilled PT interventions to address the balance and proprioception deficits she exhibits.    OBJECTIVE IMPAIRMENTS: Abnormal gait, decreased activity tolerance, decreased balance, decreased coordination, decreased endurance, decreased mobility, difficulty walking, decreased ROM, decreased strength, decreased safety awareness, impaired perceived functional ability, impaired flexibility, improper body mechanics, postural dysfunction, and pain.   ACTIVITY LIMITATIONS: carrying, lifting, bending, sitting, standing, squatting, sleeping, stairs, transfers, bed mobility, bathing, toileting, dressing, reach over head, locomotion level, and caring for others  PARTICIPATION LIMITATIONS: meal  prep, cleaning, laundry, interpersonal relationship, driving, shopping, community activity, yard work, and church  PERSONAL FACTORS: Age, Past/current experiences, Time since onset of injury/illness/exacerbation, and 3+ comorbidities: C5-7 ACDF 09/21/20; idiopathic progressive neuropathy; hypthyroidism; DDD; remote h/o back surgery at age 24; h/o scoliosis; Takotsubo cardiomyopathy 01/10/22   are also affecting patient's functional outcome.   REHAB POTENTIAL: Good  CLINICAL DECISION MAKING: Evolving/moderate complexity  EVALUATION COMPLEXITY: Moderate   GOALS: Goals reviewed with patient? Yes  SHORT TERM GOALS: Target date: 09/23/2022  Patient will be independent with initial HEP to improve outcomes and carryover.  Baseline:  Goal status: MET  09/13/22  2.  Complete balance assessment and update LTGs as indicated.  Baseline:  Goal status: MET  08/28/22  LONG TERM GOALS: Target date: 10/21/2022, extended to 12/03/2022   Patient will be independent with  ongoing/advanced HEP for self-management at home.  Baseline:  Goal status: IN PROGRESS  10/08/22 - met for current HEP  2.  Patient to demonstrate ability to achieve and maintain good spinal alignment/posturing and body mechanics needed for daily activities. Baseline:  Goal status: MET  10/08/22 - pt demonstrates good awareness of neutral spine alignment  3.  Patient will demonstrate functional pain free lumbar ROM to perform ADLs.   Baseline:  Goal status: IN PROGRESS  10/08/22 - limited in all directions with standing balance impacting assessment  4.  Patient will demonstrate improved B proximal LE strength to >/= 4 to 4+/5 for improved stability and ease of mobility. Baseline: refer to MMT table above Goal status: IN PROGRESS  10/08/22 - knee strength improving, but continued hip weakness (now more evident when pt able to assume standardized MMT testing positions vs testing in sitting on eval)  5  Patient will demonstrate improved B ankle strength to >/= 3+ to 4-/5 for improved gait stability. Baseline: refer to MMT table above Goal status: IN PROGRESS  10/08/22 - overall ankle strength improving by at least 1/2 grade  6.  Patient will ambulate with improved gait pattern with decreased B foot drop/slap and no evidence of instability/LOB. Baseline:  Goal status: IN PROGRESS  10/08/22 - Pt reports no foot slap ~2/3 of the time when walking, but it becomes more evident with fatigue. Gait remains somewhat ataxic.  7. Patient will report </= 32% on Modified Oswestry to demonstrate improved functional ability with decreased pain interference. Baseline: 22 / 50 = 44.0% (eval); 23 / 50 = 46.0% (10/15/22) Goal status: IN PROGRESS  10/15/22 - 23 / 50 = 46.0%  8.  Patient will improve Berg score to >/= 36/56 to improve safety and stability with ADLs in standing and reduce risk for falls. Baseline: 27/56 Goal status: MET and REVISED (see 8a.below) 10/08/22 - Berg = 36/56  8a.  Patient will improve Berg  score to >/= 44/56 to improve safety and stability with ADLs in standing and reduce risk for falls. Baseline: 36/56 (10/08/22) Goal status: REVISED  9.  Patient will improve FGA score to >/= 19/30 to improve gait stability and reduce risk for falls. Baseline: 8/30 (08/28/22); 18/30 (10/15/22) Goal status: IN PROGRESS  10/15/22 - FGA = 18/30   PLAN:  PT FREQUENCY: 2x/week  PT DURATION: 6-8 weeks  PLANNED INTERVENTIONS: Therapeutic exercises, Therapeutic activity, Neuromuscular re-education, Balance training, Gait training, Patient/Family education, Self Care, Joint mobilization, DME instructions, Aquatic Therapy, Dry Needling, Electrical stimulation, Cryotherapy, Moist heat, scar mobilization, Taping, Ultrasound, Ionotophoresis 4mg /ml Dexamethasone, Manual therapy, and Re-evaluation - 97032 approved for E-stim, but G0283 (E-stim) not approved  per [facility] prior authorization  PLAN FOR NEXT SESSION: core/lumbar strengthening; balance training and gait stability, SLS activities.    Hidaya Daniel Romero-Perozo, Student-PT 10/15/2022, 11:56 AM

## 2022-10-16 ENCOUNTER — Ambulatory Visit: Payer: Medicare PPO | Attending: Neurological Surgery | Admitting: Neurological Surgery

## 2022-10-16 ENCOUNTER — Encounter: Payer: Self-pay | Admitting: Neurological Surgery

## 2022-10-16 VITALS — BP 158/74 | HR 94 | Resp 18 | Ht 61.0 in | Wt 126.2 lb

## 2022-10-16 DIAGNOSIS — M5416 Radiculopathy, lumbar region: Secondary | ICD-10-CM | POA: Insufficient documentation

## 2022-10-16 NOTE — Progress Notes (Signed)
[facility]  Follow up Note    HPI     Chief Complaint   Patient presents with    Post-op     DOS 05/24/22  S/p L2-L3 XLIF/PSF  XR L-SPINE 08/27/22 - [place]   PT       Stephanie Castaneda is a 82 y.o. female who is now 5 months s/p L2-3 XLIF/ PSF on 05/24/22. She is doing very well. Ambulating a lot. Pre op leg symptoms have resolved. Using calcitonin and bone stimulator. Not taking pain medications. Undergoing physical therapy which is helpful.    Physical Examination   VITAL SIGNS:     Vitals:    10/16/22 1535   BP: 158/74   Pulse: 94   Resp: 18          Neurologic Exam  Spine  No visible deformity  Full ROM  Non-tender to palpation    Mental Status    Alert   Speech clear and fluent     Cranial Nerves    CN III, IV, VI : Pupils are equal, round, and reactive to light.                         Extraocular motions are normal.   CN VII: Facial expression full, symmetric.   CN VIII: Hearing intact bilaterally  CN XI: Shoulder shrug symmetric    Motor Exam   Overall muscle tone: normal  Strength:              UE's:    Deltoid  C5 Bicep  C5-6 Tricep  C6-7 Wrist Ext  C6-7 Wrist   Flex  C7 Grip  C8 IO  C8-T1   Left 5 5 5 5 5 5 5    Right 5 5 5 5 5 5 5      LE's:    HF  L2-3 KE  L3-4 KF  L5-S1 DF  L4-5 PF  S1 EHL  L5   Left 5 5 5 5 5 5    Right 5 5 5 5 5 5      Sensation  Intact to light touch in upper and lower extremities    Reflexes   Left Right   Biceps  2+ 2+   Brachioradialis 2+ 2+   Patella 2+ 2+   Achilles 2+ 2+     Gait  Normal    Incision  Well healed without drainage or erythema.      Review of Systems   Review of Systems  Constitutional: negative for fever or chills.  HENT: Negative for tinnitus or rhinorrhea   Eyes: Negative for visual disturbance.   Musculoskeletal: Negative for gait problem. Negative for neck pain. Negative for back pain.   Skin: Negative for wounds.  Neurological: no history of seizures.  Respiratory: Negative for cough or wheezing  Endocrine: Negative for cold or heat  intolerance   GI: Negative for constipation or diarrhea     Radiology Interpretation   No results found.   Xray dated 08/27/22 reveals stable hardware and alignment .    The images above were personally reviewed and discussed in detail with the patient.       Impression   Stephanie Castaneda is a 82 y.o. female who is now 5 months s/p L2-3 XLIF/ PSF on 05/24/22    I personally reviewed the x-ray studies of the lumbar spine demonstrating a stable instrumented fusion without evidence of hardware failure or malalignment.  Patient  is neurologically intact on examination today.  The incision is well-healed without complication.      Plan   Continue LSO brace for an additional 4 weeks  Continue bone stimulator and calcitonin nasal spray  Advised patient to maintain spine precautions: no bending, lifting greater than 10 pounds, or twisting   May return to work at this time     Orders placed:  Orders Placed This Encounter   Procedures    XR Lumbar Spine AP And Lateral     Requested Prescriptions      No prescriptions requested or ordered in this encounter       Follow-up   No follow-ups on file.   In 6 months with repeat x-rays of the lumbar spine.    Lajuan Lines, PA-C  Hermenia Fiscal, MD    I, Cayton Cuevas PA-C, performed portions of this visit by personally conducting most of the HPI and parts of the physical examination portions of the visit. The patient was also seen by [name] MD for the imaging interpretation, assessment and formulation of the medical decision making in the entirety. [name], MD also performed substantive portions of the HPI and physical examination parts of the visit. He also reviewed and updated the documented findings and plan of care.

## 2022-10-17 ENCOUNTER — Encounter: Payer: Self-pay | Admitting: Physical Therapy

## 2022-10-17 ENCOUNTER — Encounter: Payer: Medicare PPO | Admitting: Physical Therapy

## 2022-10-21 ENCOUNTER — Encounter: Payer: Medicare PPO | Admitting: Physical Therapy

## 2022-10-22 ENCOUNTER — Encounter: Payer: Self-pay | Admitting: Physical Therapy

## 2022-10-22 ENCOUNTER — Ambulatory Visit: Payer: Medicare PPO | Admitting: Physical Therapy

## 2022-10-22 DIAGNOSIS — M6281 Muscle weakness (generalized): Secondary | ICD-10-CM

## 2022-10-22 DIAGNOSIS — R2689 Other abnormalities of gait and mobility: Secondary | ICD-10-CM

## 2022-10-22 DIAGNOSIS — R2681 Unsteadiness on feet: Secondary | ICD-10-CM

## 2022-10-22 DIAGNOSIS — M5416 Radiculopathy, lumbar region: Secondary | ICD-10-CM

## 2022-10-22 NOTE — Therapy (Signed)
OUTPATIENT PHYSICAL THERAPY TREATMENT    Patient Name: [name] Trujillo MRN: [id number] DOB:Mar 29, 1941, 82 y.o., female Today's Date: 10/22/2022  END OF SESSION:  PT End of Session - 10/22/22 1107     Visit Number 13    Date for PT Re-Evaluation 12/03/22    Authorization Type [employer]    Authorization Time Period 08/27/23 - 12/03/22: Total of 22 visits approved through 12/03/22 as of 10/22/22    Authorization - Visit Number 13    Authorization - Number of Visits 22    Progress Note Due on Visit 20    PT Start Time 1107    PT Stop Time 1150    PT Time Calculation (min) 43 min    Activity Tolerance Patient tolerated treatment well    Behavior During Therapy WFL for tasks assessed/performed                  Past Medical History:  Diagnosis Date   High cholesterol    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   She had ACUTE TAKOTSUBO (STRESS) CARDIOMYOPATHY with elevated Troponin Levels - this would be considered "Demand Ischemia - Demand Infarction" & NOT associated with ACS/CAD.     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  - Cath with NO CAD. ==> RESOLVED: f/u Echo 03/2022: EF 60-65%.   Past Surgical History:  Procedure Laterality Date   APPENDECTOMY     17-18 yo   BACK SURGERY     Age 22   CESAREAN SECTION     ECTOPIC PREGNANCY SURGERY     LAPAROSCOPIC HYSTERECTOMY     LEFT HEART CATH AND CORONARY ANGIOGRAPHY N/A 01/09/2022   Procedure: LEFT HEART CATH AND CORONARY ANGIOGRAPHY;  Surgeon: Sherren Mocha, MD;  Location: [facility];  Service: CV:: Widely patent coronaries with mild nonobstructive LAD plaquing.  Right dominant system.  Normal LVEDP.  Based on  clinical presentation, findings are consistent with acute Takotsubo Cardiomyopathy Syndrome   POSTERIOR FUSION LUMBAR SPINE  05/24/2022   ([place]; [name], MD): L2-L3 XLIF, L2-L3 POSTERIOR DECOMPRESSION AND FUSION   TRANSTHORACIC ECHOCARDIOGRAM  01/08/2022   Severely decreased LV function-EF 25 to 30%.  Mid to apical (mostly anterior) with normal basal motion.  GR 2 DD-elevated LAP.  Mildly dilated LA.  Aortic sclerosis with no stenosis.  No AI.  Normal MV with mild to moderate TR.  Mildly elevated RAP, and PAP (estimated 49 mg). If LAD CAD ruled out - consistent with Takotsubo CM Syndrome.   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    PCP: Derinda Late, MD   REFERRING PROVIDER: Dorena Dew, NP for Hermenia Fiscal, MD   REFERRING DIAG:  Z98.1 (ICD-10-CM) - S/P lumbar spinal fusion   THERAPY DIAG:  Radiculopathy, lumbar region  Muscle weakness (generalized)  Other abnormalities of gait and mobility  Unsteadiness on feet  RATIONALE FOR EVALUATION AND TREATMENT: Rehabilitation  ONSET DATE: 05/24/2022 - L2-3 XLIF/PSF   NEXT MD VISIT: September 2024    SUBJECTIVE:                                                                                                                                                                                                         SUBJECTIVE STATEMENT:  Pt went to see her MD last week and everything went well and told  her she is progressing according to plan, told her to come back at the one year mark of sx in September.   PAIN: Are you having pain? No  PERTINENT HISTORY:  L2-3 XLIF/PSF 05/24/22; C5-7 ACDF 09/21/20; idiopathic progressive neuropathy; hypthyroidism; DDD; remote h/o back surgery at age 52; h/o scoliosis; Takotsubo cardiomyopathy 01/10/22   PRECAUTIONS: Back - No BLT, TLSO discontinued as of 09/11/22  WEIGHT BEARING RESTRICTIONS: No  FALLS:  Has patient fallen in last 6 months? No  LIVING ENVIRONMENT: Lives with: lives with their spouse Lives in: House/apartment Stairs: Yes: External: 5-6 steps; on left going up Has following equipment at home: Walker - 2 wheeled, shower chair, and hiking/walking poles  OCCUPATION: Retired Pharmacist, hospital  PLOF: Independent and Leisure: walking daily for total of ~1.5 miles; working in the yard; sewing/quilting    PATIENT GOALS: "Be able to move and do things in the house and yard w/o worrying about falling."   OBJECTIVE:   DIAGNOSTIC FINDINGS:  08/27/22 - Lumbar spine x-ray completed as part of postop series - results pending   07/01/22 - Lumbar spine x-ray: Expected postoperative appearance related to L2-L3 fixation.   06/12/21 - Lumbar MRI: Postoperative and degenerative changes of the lumbar spine as described, similar compared to July 28, 2020. At L2-L3, there is moderate spinal canal stenosis due to disc bulging, endplate osteophytic spurring, facet and ligamentum flavum hypertrophy. Milder spinal canal stenosis is seen at L3-L4. There is multilevel neural foraminal stenosis.   PATIENT SURVEYS:  Modified Oswestry 22 / 50 = 44.0%, 23 / 50 = 46.0% (10/15/22)   SCREENING FOR RED FLAGS: Bowel or bladder incontinence: Yes: bladder - takes Vesicare Spinal tumors: No Cauda equina syndrome: No Compression fracture: No Abdominal aneurysm: No  COGNITION:  Overall cognitive status: Within functional limits for tasks assessed    SENSATION: WFL - Pt  reports LE sensation has improved since surgery  MUSCLE LENGTH: NT  POSTURE:  rounded shoulders, forward head, and right pelvic obliquity  PALPATION: NT  LUMBAR ROM: Deferred on eval pending MD clearance to release her from post-surgical precautions   Active  AROM  10/08/22 *  Flexion Hands to mid shin  Extension 50% limited  Right lateral flexion Hand to lateral knee  Left lateral flexion Hand to lateral knee  Right rotation 50% limited  Left rotation 50% limited  (Blank rows = not tested)  (* - ROM assessment limited in standing due to balance deficits)  LOWER EXTREMITY ROM:    Grossly WFL  LOWER EXTREMITY MMT:  (tested in sitting on eval, traditional testing positions on 10/08/22)  MMT Right eval Left eval Right 10/08/22 Left 10/08/22  Hip flexion 4 4 4 4  $ Hip extension 4- 4- 4- 3-  Hip abduction 4+ 4+ 4+ 4  Hip adduction 4+ 4+ 3+ 4  Hip internal rotation 4- 4- 4 4  Hip external rotation 3+ 3+ 4- 3+  Knee flexion 4+ 4+ 5 5  Knee extension 4+ 4+ 4+ 4+  Ankle dorsiflexion 3- 3- 3 3-  Ankle plantarflexion 4- NWB 4- NWB 4+ NWB 4 NWB  Ankle inversion 3- 2- 3+ 2  Ankle eversion 4- 3+ 4 3+   (Blank rows = not tested)  LUMBAR SPECIAL TESTS:  NT  FUNCTIONAL TESTS:  5 times sit to stand: 22.19 sec; 22.87 sec (10/08/22) Timed up and go (TUG): 14.84 sec with hiking pole (08/28/22); 13.44 sec with hiking pole, 13.78 sec w/o AD (10/08/22) 10 meter walk test: 15.47 sec with hiking pole; Gait speed = 2.12 ft/sec (08/28/22); 16.03 sec with hiking pole, 16.19 sec w/o AD; Gait speed = 2.05 ft/sec with hiking pole, 2.03 ft/sec w/o AD(10/08/22) Berg Balance Scale: 27/56; < 36 high risk for falls (close to 100%); 36/56 (10/08/22)  Functional gait assessment: 8/30; < 19 = high risk fall (08/28/22), 18/30 (10/15/22) Left hip Trendelenburg evident in right SLS  GAIT: Distance walked: 60 Assistive device utilized:  single walking/hiking pole on R Level of assistance: SBA Gait pattern:   increased sway, step through pattern, decreased stride length, trendelenburg, and lateral hip instability Comments: L>R foot slap   TODAY'S TREATMENT:   10/22/22 THERAPEUTIC EXERCISE: to improve flexibility, strength and mobility.  Verbal and tactile cues throughout for technique. Rec bike - L3 x 6 min  Functional squat position with B shoulder OH flexion 5 x 5", 2 sets (hips lightly touching bolster on mat table for assist with balance)  Standing TrA + GTB rows 2 x 10, close guarding/SBA of PT Standing TrA + GTB retraction + shoulder extension to neutral x 10, close guarding/SBA of PT Standing Modified Tandem TrA + RTB pallof press x 10 bil, normal stance x 10 bilat; close guarding  NEUROMUSCULAR RE-EDUCATION: To improve posture, balance, proprioception, coordination, and reduce fall risk.  Dot 5-way with [employer]  [employer] pews (ant/post wt shift) w/ bolster behind pt targeting hip and ankle balance strategies x10 - SBA/CGA of PT with intermittent UE support on PT's arms   10/15/22  THERAPEUTIC EXERCISE: to improve flexibility, strength and mobility.  Verbal and tactile cues throughout for technique. Rec bike - L3 x 6 min   Standing TrA + RTB rows 2 x 10, close guarding/SBA of PT Standing TrA + RTB retraction + shoulder extension to neutral x 10, close guarding/SBA of PT Standing TrA + RTB short arc Trunk Rotation x 10 bil, close guarding/SBA of PT, making  sure pt does not go full arc of motion Standing TrA + RTB pallof press x 10 bil, close guarding/SBA of PT  THERAPEUTIC ACTIVITIES:  FGA: 18/30 Modified Oswestry: 23 / 50 = 46.0 %  NEUROMUSCULAR RE-EDUCATION: To improve posture, balance, proprioception, coordination, and reduce fall risk.  Dot 5-way with [employer]  [employer] pews (ant/post wt shift) targeting hip and ankle balance strategies - SBA/CGA of PT with intermittent UE support on PT's arms   10/10/22 THERAPEUTIC EXERCISE: to improve flexibility, strength and  mobility.  Verbal and tactile cues throughout for technique. Rec bike - L3 x 6 min Functional squat position with B shoulder OH flexion 5 x 5", 2 sets (hips lightly touching bolster on mat table for assist with balance)  Standing TrA + RTB rows x 10, close guarding/SBA of PT Standing TrA + RTB retraction + shoulder extension to neutral x 10, close guarding/SBA of PT Standing TrA + RTB pallof press x 10 bil, close guarding/SBA of PT  NEUROMUSCULAR RE-EDUCATION: To improve posture, balance, proprioception, coordination, and reduce fall risk. Standing on Airex pad - bean bag reach and toss focusing on good body mechanics with reaching for bean bags - CGA from PT via gait belt  [employer] pews (ant/post wt shift) targeting hip and ankle balance strategies - SBA/CGA of PT with intermittent UE support on PT's arms   PATIENT EDUCATION:  Education details: progress with PT Person educated: Patient Education method: Explanation Education comprehension: verbalized understanding  HOME EXERCISE PROGRAM: Access Code: [email or web address] URL: [email or web address] Date: 09/13/2022 Prepared by: Annie Paras  Exercises - Seated Heel Toe Raises  - 1 x daily - 7 x weekly - 2 sets - 10 reps - 3 sec hold - Standing Isometric Hip Abduction with Ball on Wall  - 1 x daily - 7 x weekly - 2 sets - 10 reps - 3 sec hold - Seated Transversus Abdominis Bracing  - 2 x daily - 7 x weekly - 2 sets - 10 reps - 3-5 sec hold - Seated Isometric Hip Abduction with Resistance  - 1 x daily - 3 x weekly - 2 sets - 10 reps - 3 sec hold - Seated March with Resistance  - 1 x daily - 3 x weekly - 2 sets - 10 reps - 3 sec hold - Seated Shoulder Row with Anchored Resistance  - 1 x daily - 3 x weekly - 2 sets - 10 reps - 3-5 hold hold - Seated Single Arm Shoulder Row with Anchored Resistance  - 1 x daily - 3 x weekly - 2 sets - 10 reps - 3 sec hold - Seated Anti-Rotation Press With Anchored Resistance  - 1 x daily - 3 x weekly - 2  sets - 10 reps - 3 sec hold   ASSESSMENT:  CLINICAL IMPRESSION:  Pt came into today's session without any pain or concerns. Pt went to MD last week and was told she is progressing well and only needs to go back to him at the one year mark of her sx in September. Today's focus was on progressing UE exercises and pt responded to this well moving from a RTB to GTB on the rowing exercises. Pt continues to struggle with WS from the ball of her feet to the front so following sessions need to be focused on this. Pt continued working on her SLS but continues to struggle with bringing her foot backwards while on the slider. Pt will continue to benefit from skilled PT  interventions to address the balance and proprioception deficits she exhibits.    OBJECTIVE IMPAIRMENTS: Abnormal gait, decreased activity tolerance, decreased balance, decreased coordination, decreased endurance, decreased mobility, difficulty walking, decreased ROM, decreased strength, decreased safety awareness, impaired perceived functional ability, impaired flexibility, improper body mechanics, postural dysfunction, and pain.   ACTIVITY LIMITATIONS: carrying, lifting, bending, sitting, standing, squatting, sleeping, stairs, transfers, bed mobility, bathing, toileting, dressing, reach over head, locomotion level, and caring for others  PARTICIPATION LIMITATIONS: meal prep, cleaning, laundry, interpersonal relationship, driving, shopping, community activity, yard work, and church  PERSONAL FACTORS: Age, Past/current experiences, Time since onset of injury/illness/exacerbation, and 3+ comorbidities: C5-7 ACDF 09/21/20; idiopathic progressive neuropathy; hypthyroidism; DDD; remote h/o back surgery at age 44; h/o scoliosis; Takotsubo cardiomyopathy 01/10/22   are also affecting patient's functional outcome.   REHAB POTENTIAL: Good  CLINICAL DECISION MAKING: Evolving/moderate complexity  EVALUATION COMPLEXITY: Moderate   GOALS: Goals reviewed  with patient? Yes  SHORT TERM GOALS: Target date: 09/23/2022  Patient will be independent with initial HEP to improve outcomes and carryover.  Baseline:  Goal status: MET  09/13/22  2.  Complete balance assessment and update LTGs as indicated.  Baseline:  Goal status: MET  08/28/22  LONG TERM GOALS: Target date: 10/21/2022, extended to 12/03/2022   Patient will be independent with ongoing/advanced HEP for self-management at home.  Baseline:  Goal status: IN PROGRESS  10/08/22 - met for current HEP  2.  Patient to demonstrate ability to achieve and maintain good spinal alignment/posturing and body mechanics needed for daily activities. Baseline:  Goal status: MET  10/08/22 - pt demonstrates good awareness of neutral spine alignment  3.  Patient will demonstrate functional pain free lumbar ROM to perform ADLs.   Baseline:  Goal status: IN PROGRESS  10/08/22 - limited in all directions with standing balance impacting assessment  4.  Patient will demonstrate improved B proximal LE strength to >/= 4 to 4+/5 for improved stability and ease of mobility. Baseline: refer to MMT table above Goal status: IN PROGRESS  10/08/22 - knee strength improving, but continued hip weakness (now more evident when pt able to assume standardized MMT testing positions vs testing in sitting on eval)  5  Patient will demonstrate improved B ankle strength to >/= 3+ to 4-/5 for improved gait stability. Baseline: refer to MMT table above Goal status: IN PROGRESS  10/08/22 - overall ankle strength improving by at least 1/2 grade  6.  Patient will ambulate with improved gait pattern with decreased B foot drop/slap and no evidence of instability/LOB. Baseline:  Goal status: IN PROGRESS  10/08/22 - Pt reports no foot slap ~2/3 of the time when walking, but it becomes more evident with fatigue. Gait remains somewhat ataxic.  7. Patient will report </= 32% on Modified Oswestry to demonstrate improved functional ability with  decreased pain interference. Baseline: 22 / 50 = 44.0% (eval); 23 / 50 = 46.0% (10/15/22) Goal status: IN PROGRESS  10/15/22 - 23 / 50 = 46.0%  8.  Patient will improve Berg score to >/= 36/56 to improve safety and stability with ADLs in standing and reduce risk for falls. Baseline: 27/56 Goal status: MET and REVISED (see 8a.below) 10/08/22 - Berg = 36/56  8a.  Patient will improve Berg score to >/= 44/56 to improve safety and stability with ADLs in standing and reduce risk for falls. Baseline: 36/56 (10/08/22) Goal status: REVISED   9.  Patient will improve FGA score to >/= 19/30 to improve gait stability and  reduce risk for falls. Baseline: 8/30 (08/28/22); 18/30 (10/15/22) Goal status: IN PROGRESS  10/15/22 - FGA = 18/30   PLAN:  PT FREQUENCY: 2x/week  PT DURATION: 6-8 weeks  PLANNED INTERVENTIONS: Therapeutic exercises, Therapeutic activity, Neuromuscular re-education, Balance training, Gait training, Patient/Family education, Self Care, Joint mobilization, DME instructions, Aquatic Therapy, Dry Needling, Electrical stimulation, Cryotherapy, Moist heat, scar mobilization, Taping, Ultrasound, Ionotophoresis 52m/ml Dexamethasone, Manual therapy, and Re-evaluation - [id number] for E-stim, but G0283 (E-stim) not approved per [facility] authorization  PLAN FOR NEXT SESSION: core/lumbar strengthening; balance training and gait stability, SLS activities that focus on bringing foot onto different directions.     Jamaar Howes Romero-Perozo, Student-PT 10/22/2022, 12:02 PM

## 2022-10-24 ENCOUNTER — Ambulatory Visit: Payer: Medicare PPO | Admitting: Physical Therapy

## 2022-10-24 ENCOUNTER — Encounter: Payer: Self-pay | Admitting: Physical Therapy

## 2022-10-24 DIAGNOSIS — R2681 Unsteadiness on feet: Secondary | ICD-10-CM

## 2022-10-24 DIAGNOSIS — M5416 Radiculopathy, lumbar region: Secondary | ICD-10-CM

## 2022-10-24 DIAGNOSIS — R2689 Other abnormalities of gait and mobility: Secondary | ICD-10-CM

## 2022-10-24 DIAGNOSIS — M6281 Muscle weakness (generalized): Secondary | ICD-10-CM

## 2022-10-24 NOTE — Therapy (Signed)
OUTPATIENT PHYSICAL THERAPY TREATMENT    Patient Name: [name] Trujillo MRN: [id number] DOB:[birth year], 82 y.o., female Today's Date: 10/24/2022  END OF SESSION:  PT End of Session - 10/24/22 1620     Visit Number 14    Date for PT Re-Evaluation 12/03/22    Authorization Type [employer]    Authorization Time Period 08/27/23 - 12/03/22: Total of 22 visits approved through 12/03/22 as of 10/22/22    Authorization - Visit Number 14    Authorization - Number of Visits 22    Progress Note Due on Visit 20    PT Start Time [id number]    PT Stop Time 1703    PT Time Calculation (min) 46 min    Activity Tolerance Patient tolerated treatment well    Behavior During Therapy [facility] for tasks assessed/performed                   Past Medical History:  Diagnosis Date   High cholesterol    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   She had ACUTE TAKOTSUBO (STRESS) CARDIOMYOPATHY with elevated Troponin Levels - this would be considered "Demand Ischemia - Demand Infarction" & NOT associated with ACS/CAD.     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  - Cath with NO CAD. ==> RESOLVED: f/u Echo 03/2022: EF 60-65%.   Past Surgical History:  Procedure Laterality Date   APPEN[age]CTOMY     17-18 yo   BACK SURGERY     Age 73   CESAREAN SECTION     ECTOPIC PREGNANCY SURGERY     LAPAROSCOPIC HYSTERECTOMY     LEFT HEART CATH AND CORONARY ANGIOGRAPHY N/A 01/09/2022   Procedure: LEFT HEART CATH AND CORONARY ANGIOGRAPHY;  Surgeon: Sherren Mocha, MD;  Location: [facility];  Service: CV:: Widely patent coronaries with mild nonobstructive LAD plaquing.  Right dominant system.  Normal LVEDP.  Based on  clinical presentation, findings are consistent with acute Takotsubo Cardiomyopathy Syndrome   POSTERIOR FUSION LUMBAR SPINE  05/24/2022   ([place]; [name], MD): L2-L3 XLIF, L2-L3 POSTERIOR DECOMPRESSION AND FUSION   TRANSTHORACIC ECHOCARDIOGRAM  01/08/2022   Severely decreased LV function-EF 25 to 30%.  Mid to apical (mostly anterior) with normal basal motion.  GR 2 DD-elevated LAP.  Mildly dilated LA.  Aortic sclerosis with no stenosis.  No AI.  Normal MV with mild to moderate TR.  Mildly elevated RAP, and PAP (estimated 49 mg). If LAD CAD ruled out - consistent with Takotsubo CM Syndrome.   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    PCP: Derinda Late, MD   REFERRING PROVIDER: Dorena Dew, NP for Hermenia Fiscal, MD   REFERRING  DIAG: Z98.1 (ICD-10-CM) - S/P lumbar spinal fusion   THERAPY DIAG:  Radiculopathy, lumbar region  Muscle weakness (generalized)  Other abnormalities of gait and mobility  Unsteadiness on feet  Foot drop, left  RATIONALE FOR EVALUATION AND TREATMENT: Rehabilitation  ONSET DATE: 05/24/2022 - L2-3 XLIF/PSF   NEXT MD VISIT: September 2024    SUBJECTIVE:                                                                                                                                                                                                         SUBJECTIVE STATEMENT:  Pt feels like she has less energy at the moment  due to having done different activities throughout the day today. Pt stated that she is able to kneel down during mass which she hasn't been able to do and walked up a hill as well.   PAIN: Are you having pain? No  PERTINENT HISTORY:  L2-3 XLIF/PSF 05/24/22; C5-7 ACDF 09/21/20; idiopathic progressive neuropathy; hypthyroidism; DDD; remote h/o back surgery at age 66; h/o scoliosis; Takotsubo cardiomyopathy 01/10/22   PRECAUTIONS: Back - No BLT, TLSO discontinued as of 09/11/22  WEIGHT BEARING RESTRICTIONS: No  FALLS:  Has patient fallen in last 6 months? No  LIVING ENVIRONMENT: Lives with: lives with their spouse Lives in: House/apartment Stairs: Yes: External: 5-6 steps; on left going up Has following equipment at home: Walker - 2 wheeled, shower chair, and hiking/walking poles  OCCUPATION: Retired Pharmacist, hospital  PLOF: Independent and Leisure: walking daily for total of ~1.5 miles; working in the yard; sewing/quilting    PATIENT GOALS: "Be able to move and do things in the house and yard w/o worrying about falling."   OBJECTIVE:   DIAGNOSTIC FINDINGS:  08/27/22 - Lumbar spine x-ray completed as part of postop series - results pending   07/01/22 - Lumbar spine x-ray: Expected postoperative appearance related to L2-L3 fixation.   06/12/21 - Lumbar MRI: Postoperative and degenerative changes of the lumbar spine as described, similar compared to July 28, 2020. At L2-L3, there is moderate spinal canal stenosis due to disc bulging, endplate osteophytic spurring, facet and ligamentum flavum hypertrophy. Milder spinal canal stenosis is seen at L3-L4. There is multilevel neural foraminal stenosis.   PATIENT SURVEYS:  Modified Oswestry 22 / 50 = 44.0%, 23 / 50 = 46.0% (10/15/22)   SCREENING FOR RED FLAGS: Bowel or bladder incontinence: Yes: bladder - takes Vesicare Spinal tumors: No Cauda equina syndrome: No Compression fracture: No Abdominal aneurysm: No  COGNITION:  Overall cognitive  status: Within functional limits for tasks assessed  SENSATION: WFL - Pt reports LE sensation has improved since surgery  MUSCLE LENGTH: NT  POSTURE:  rounded shoulders, forward head, and right pelvic obliquity  PALPATION: NT  LUMBAR ROM: Deferred on eval pending MD clearance to release her from post-surgical precautions   Active  AROM  10/08/22 *  Flexion Hands to mid shin  Extension 50% limited  Right lateral flexion Hand to lateral knee  Left lateral flexion Hand to lateral knee  Right rotation 50% limited  Left rotation 50% limited  (Blank rows = not tested)  (* - ROM assessment limited in standing due to balance deficits)  LOWER EXTREMITY ROM:    Grossly WFL  LOWER EXTREMITY MMT:  (tested in sitting on eval, traditional testing positions on 10/08/22)  MMT Right eval Left eval Right 10/08/22 Left 10/08/22  Hip flexion 4 4 4 4  $ Hip extension 4- 4- 4- 3-  Hip abduction 4+ 4+ 4+ 4  Hip adduction 4+ 4+ 3+ 4  Hip internal rotation 4- 4- 4 4  Hip external rotation 3+ 3+ 4- 3+  Knee flexion 4+ 4+ 5 5  Knee extension 4+ 4+ 4+ 4+  Ankle dorsiflexion 3- 3- 3 3-  Ankle plantarflexion 4- NWB 4- NWB 4+ NWB 4 NWB  Ankle inversion 3- 2- 3+ 2  Ankle eversion 4- 3+ 4 3+   (Blank rows = not tested)  LUMBAR SPECIAL TESTS:  NT  FUNCTIONAL TESTS:  5 times sit to stand: 22.19 sec; 22.87 sec (10/08/22) Timed up and go (TUG): 14.84 sec with hiking pole (08/28/22); 13.44 sec with hiking pole, 13.78 sec w/o AD (10/08/22) 10 meter walk test: 15.47 sec with hiking pole; Gait speed = 2.12 ft/sec (08/28/22); 16.03 sec with hiking pole, 16.19 sec w/o AD; Gait speed = 2.05 ft/sec with hiking pole, 2.03 ft/sec w/o AD(10/08/22) Berg Balance Scale: 27/56; < 36 high risk for falls (close to 100%); 36/56 (10/08/22)  Functional gait assessment: 8/30; < 19 = high risk fall (08/28/22), 18/30 (10/15/22) Left hip Trendelenburg evident in right SLS  GAIT: Distance walked: 60 Assistive device  utilized:  single walking/hiking pole on R Level of assistance: SBA Gait pattern:  increased sway, step through pattern, decreased stride length, trendelenburg, and lateral hip instability Comments: L>R foot slap   TODAY'S TREATMENT:   10/24/22 THERAPEUTIC EXERCISE: to improve flexibility, strength and mobility.  Verbal and tactile cues throughout for technique. NuStep- L5 x 6 min  Standing 4-way YTB 1x10 each bilat, B UE support on back of chair for balance Seated Horizontal Abduction x15 RTB  Seated Diagonals x10 RTB  Standing TrA + GTB rows 2 x 10, close guarding/SBA of PT Standing TrA + GTB retraction + shoulder extension to neutral x 10, close guarding/SBA of PT  NEUROMUSCULAR RE-EDUCATION: To improve posture, balance, proprioception, coordination, and reduce fall risk.  Dot 5-way with slider x2 each  [facility] knock down/pick up     10/22/22 THERAPEUTIC EXERCISE: to improve flexibility, strength and mobility.  Verbal and tactile cues throughout for technique. Rec bike - L3 x 6 min  Functional squat position with B shoulder OH flexion 5 x 5", 2 sets (hips lightly touching bolster on mat table for assist with balance)  Standing TrA + GTB rows 2 x 10, close guarding/SBA of PT Standing TrA + GTB retraction + shoulder extension to neutral x 10, close guarding/SBA of PT Standing Modified Tandem TrA + RTB pallof press x 10 bil, normal stance x 10 bilat; close guarding  NEUROMUSCULAR RE-EDUCATION:  To improve posture, balance, proprioception, coordination, and reduce fall risk.  Dot 5-way with [employer]  [employer] pews (ant/post wt shift) w/ bolster behind pt targeting hip and ankle balance strategies x10 - SBA/CGA of PT with intermittent UE support on PT's arms   10/15/22  THERAPEUTIC EXERCISE: to improve flexibility, strength and mobility.  Verbal and tactile cues throughout for technique. Rec bike - L3 x 6 min   Standing TrA + RTB rows 2 x 10, close guarding/SBA of PT Standing TrA +  RTB retraction + shoulder extension to neutral x 10, close guarding/SBA of PT Standing TrA + RTB short arc Trunk Rotation x 10 bil, close guarding/SBA of PT, making sure pt does not go full arc of motion Standing TrA + RTB pallof press x 10 bil, close guarding/SBA of PT  THERAPEUTIC ACTIVITIES:  FGA: 18/30 Modified Oswestry: 23 / 50 = 46.0 %  NEUROMUSCULAR RE-EDUCATION: To improve posture, balance, proprioception, coordination, and reduce fall risk.  Dot 5-way with [employer]  [employer] pews (ant/post wt shift) targeting hip and ankle balance strategies - SBA/CGA of PT with intermittent UE support on PT's arms   PATIENT EDUCATION:  Education details: progress with PT Person educated: Patient Education method: Explanation Education comprehension: verbalized understanding  HOME EXERCISE PROGRAM: Access Code: [email or web address] URL: [email or web address] Date: 09/13/2022 Prepared by: Annie Paras  Exercises - Seated Heel Toe Raises  - 1 x daily - 7 x weekly - 2 sets - 10 reps - 3 sec hold - Standing Isometric Hip Abduction with Ball on Wall  - 1 x daily - 7 x weekly - 2 sets - 10 reps - 3 sec hold - Seated Transversus Abdominis Bracing  - 2 x daily - 7 x weekly - 2 sets - 10 reps - 3-5 sec hold - Seated Isometric Hip Abduction with Resistance  - 1 x daily - 3 x weekly - 2 sets - 10 reps - 3 sec hold - Seated March with Resistance  - 1 x daily - 3 x weekly - 2 sets - 10 reps - 3 sec hold - Seated Shoulder Row with Anchored Resistance  - 1 x daily - 3 x weekly - 2 sets - 10 reps - 3-5 hold hold - Seated Single Arm Shoulder Row with Anchored Resistance  - 1 x daily - 3 x weekly - 2 sets - 10 reps - 3 sec hold - Seated Anti-Rotation Press With Anchored Resistance  - 1 x daily - 3 x weekly - 2 sets - 10 reps - 3 sec hold   ASSESSMENT:  CLINICAL IMPRESSION:  Pt came into today's session with increased soreness which pt attributes to progression of HEP from last session. Pt states  she was able to walk up a hill today and was able to get down on her knees in a pew at church so she feels very accomplished being able to do this. Pt continues to have some difficulty clearing her foot during gait, so the focus of today's session was on working on strengthening exercises for her hip extensors and flexors to be able to facilitate step height and length during gait to prevent any falls from occurring. Additionally, pt states she has to sit down in a chair to put clothes on because she continues to feel unsteadiness when shifting her weight. Therefore, there was an incorporation of SLS in various different forms for the pt to work on being able to shift her weight to hopefully facilitate her  dressing capabilities so she doesn't have to sit down. Pt will continue to benefit from skilled PT interventions to address the balance and proprioception deficits she exhibits.   OBJECTIVE IMPAIRMENTS: Abnormal gait, decreased activity tolerance, decreased balance, decreased coordination, decreased endurance, decreased mobility, difficulty walking, decreased ROM, decreased strength, decreased safety awareness, impaired perceived functional ability, impaired flexibility, improper body mechanics, postural dysfunction, and pain.   ACTIVITY LIMITATIONS: carrying, lifting, bending, sitting, standing, squatting, sleeping, stairs, transfers, bed mobility, bathing, toileting, dressing, reach over head, locomotion level, and caring for others  PARTICIPATION LIMITATIONS: meal prep, cleaning, laundry, interpersonal relationship, driving, shopping, community activity, yard work, and church  PERSONAL FACTORS: Age, Past/current experiences, Time since onset of injury/illness/exacerbation, and 3+ comorbidities: C5-7 ACDF 09/21/20; idiopathic progressive neuropathy; hypthyroidism; DDD; remote h/o back surgery at age 29; h/o scoliosis; Takotsubo cardiomyopathy 01/10/22   are also affecting patient's functional outcome.    REHAB POTENTIAL: Good  CLINICAL DECISION MAKING: Evolving/moderate complexity  EVALUATION COMPLEXITY: Moderate   GOALS: Goals reviewed with patient? Yes  SHORT TERM GOALS: Target date: 09/23/2022  Patient will be independent with initial HEP to improve outcomes and carryover.  Baseline:  Goal status: MET  09/13/22  2.  Complete balance assessment and update LTGs as indicated.  Baseline:  Goal status: MET  08/28/22  LONG TERM GOALS: Target date: 10/21/2022, extended to 12/03/2022   Patient will be independent with ongoing/advanced HEP for self-management at home.  Baseline:  Goal status: IN PROGRESS  10/08/22 - met for current HEP  2.  Patient to demonstrate ability to achieve and maintain good spinal alignment/posturing and body mechanics needed for daily activities. Baseline:  Goal status: MET  10/08/22 - pt demonstrates good awareness of neutral spine alignment  3.  Patient will demonstrate functional pain free lumbar ROM to perform ADLs.   Baseline:  Goal status: IN PROGRESS  10/08/22 - limited in all directions with standing balance impacting assessment  4.  Patient will demonstrate improved B proximal LE strength to >/= 4 to 4+/5 for improved stability and ease of mobility. Baseline: refer to MMT table above Goal status: IN PROGRESS  10/08/22 - knee strength improving, but continued hip weakness (now more evident when pt able to assume standardized MMT testing positions vs testing in sitting on eval)  5  Patient will demonstrate improved B ankle strength to >/= 3+ to 4-/5 for improved gait stability. Baseline: refer to MMT table above Goal status: IN PROGRESS  10/08/22 - overall ankle strength improving by at least 1/2 grade  6.  Patient will ambulate with improved gait pattern with decreased B foot drop/slap and no evidence of instability/LOB. Baseline:  Goal status: IN PROGRESS  10/08/22 - Pt reports no foot slap ~2/3 of the time when walking, but it becomes more evident  with fatigue. Gait remains somewhat ataxic.  7. Patient will report </= 32% on Modified Oswestry to demonstrate improved functional ability with decreased pain interference. Baseline: 22 / 50 = 44.0% (eval); 23 / 50 = 46.0% (10/15/22) Goal status: IN PROGRESS  10/15/22 - 23 / 50 = 46.0%  8.  Patient will improve Berg score to >/= 36/56 to improve safety and stability with ADLs in standing and reduce risk for falls. Baseline: 27/56 Goal status: MET and REVISED (see 8a.below) 10/08/22 - Berg = 36/56  8a.  Patient will improve Berg score to >/= 44/56 to improve safety and stability with ADLs in standing and reduce risk for falls. Baseline: 36/56 (10/08/22) Goal status: REVISED  9.  Patient will improve FGA score to >/= 19/30 to improve gait stability and reduce risk for falls. Baseline: 8/30 (08/28/22); 18/30 (10/15/22) Goal status: IN PROGRESS  10/15/22 - FGA = 18/30   PLAN:  PT FREQUENCY: 2x/week  PT DURATION: 6-8 weeks  PLANNED INTERVENTIONS: Therapeutic exercises, Therapeutic activity, Neuromuscular re-education, Balance training, Gait training, Patient/Family education, Self Care, Joint mobilization, DME instructions, Aquatic Therapy, Dry Needling, Electrical stimulation, Cryotherapy, Moist heat, scar mobilization, Taping, Ultrasound, Ionotophoresis 71m/ml Dexamethasone, Manual therapy, and Re-evaluation - [id number] for E-stim, but [id number] (E-stim) [facility]  PLAN FOR NEXT SESSION: Update HEP, continue core/lumbar strengthening; balance training and gait stability, SLS activities that focus on bringing foot onto different directions.     Dearl Rudden Romero-Perozo, Student-PT 10/24/2022, 5:11 PM

## 2022-10-29 ENCOUNTER — Encounter: Payer: Self-pay | Admitting: Physical Therapy

## 2022-10-29 ENCOUNTER — Ambulatory Visit: Payer: Medicare PPO | Admitting: Physical Therapy

## 2022-10-29 DIAGNOSIS — M6281 Muscle weakness (generalized): Secondary | ICD-10-CM

## 2022-10-29 DIAGNOSIS — M5416 Radiculopathy, lumbar region: Secondary | ICD-10-CM

## 2022-10-29 DIAGNOSIS — R2689 Other abnormalities of gait and mobility: Secondary | ICD-10-CM

## 2022-10-29 DIAGNOSIS — R2681 Unsteadiness on feet: Secondary | ICD-10-CM

## 2022-10-29 NOTE — Therapy (Signed)
OUTPATIENT PHYSICAL THERAPY TREATMENT    Patient Name: Maira Doble MRN: [id number] DOB:07-22-1941, 82 y.o., female Today's Date: 10/29/2022  END OF SESSION:  PT End of Session - 10/29/22 1319     Visit Number 15    Date for PT Re-Evaluation 12/03/22    Authorization Type [employer]    Authorization Time Period 08/27/23 - 12/03/22: Total of 22 visits approved through 12/03/22 as of 10/22/22    Authorization - Visit Number 15    Authorization - Number of Visits 22    Progress Note Due on Visit 20    PT Start Time [id number]    PT Stop Time 1400    PT Time Calculation (min) 43 min    Activity Tolerance Patient tolerated treatment well    Behavior During Therapy WFL for tasks assessed/performed                   Past Medical History:  Diagnosis Date   High cholesterol    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   She had ACUTE TAKOTSUBO (STRESS) CARDIOMYOPATHY with elevated Troponin Levels - this would be considered "Demand Ischemia - Demand Infarction" & NOT associated with ACS/CAD.     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  - Cath with NO CAD. ==> RESOLVED: f/u Echo 03/2022: EF 60-65%.   Past Surgical History:  Procedure Laterality Date   APPENDECTOMY     17-18 yo   BACK SURGERY     Age 51   CESAREAN SECTION     ECTOPIC PREGNANCY SURGERY     LAPAROSCOPIC HYSTERECTOMY     LEFT HEART CATH AND CORONARY ANGIOGRAPHY N/A 01/09/2022   Procedure: LEFT HEART CATH AND CORONARY ANGIOGRAPHY;  Surgeon: Sherren Mocha, MD;  Location: [facility];  Service: CV:: Widely patent coronaries with mild nonobstructive LAD plaquing.  Right dominant system.  Normal LVEDP.  Based on  clinical presentation, findings are consistent with acute Takotsubo Cardiomyopathy Syndrome   POSTERIOR FUSION LUMBAR SPINE  05/24/2022   ([place]; [name], MD): L2-L3 XLIF, L2-L3 POSTERIOR DECOMPRESSION AND FUSION   TRANSTHORACIC ECHOCARDIOGRAM  01/08/2022   Severely decreased LV function-EF 25 to 30%.  Mid to apical (mostly anterior) with normal basal motion.  GR 2 DD-elevated LAP.  Mildly dilated LA.  Aortic sclerosis with no stenosis.  No AI.  Normal MV with mild to moderate TR.  Mildly elevated RAP, and PAP (estimated 49 mg). If LAD CAD ruled out - consistent with Takotsubo CM Syndrome.   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    PCP: Derinda Late, MD   REFERRING PROVIDER: Dorena Dew, NP for Hermenia Fiscal, MD   REFERRING  DIAG: Z98.1 (ICD-10-CM) - S/P lumbar spinal fusion   THERAPY DIAG:  Radiculopathy, lumbar region  Muscle weakness (generalized)  Other abnormalities of gait and mobility  Unsteadiness on feet  RATIONALE FOR EVALUATION AND TREATMENT: Rehabilitation  ONSET DATE: 05/24/2022 - L2-3 XLIF/PSF   NEXT MD VISIT: September 2024    SUBJECTIVE:                                                                                                                                                                                                         SUBJECTIVE STATEMENT:  Pt is excited as she is now a great-grandmother. Pt reports she  walked (for exercise) ~1/2 mile yesterday and it went well. Pt reports she tried to partially clean the bathrooms (sinks and toilets) for her and her husband but noted increased back pain/soreness following this.  PAIN: Are you having pain? No  PERTINENT HISTORY:  L2-3 XLIF/PSF 05/24/22; C5-7 ACDF 09/21/20; idiopathic progressive neuropathy; hypthyroidism; DDD; remote h/o back surgery at age 60; h/o scoliosis; Takotsubo cardiomyopathy 01/10/22   PRECAUTIONS: Back - No BLT, TLSO discontinued as of 09/11/22  WEIGHT BEARING RESTRICTIONS: No  FALLS:  Has patient fallen in last 6 months? No  LIVING ENVIRONMENT: Lives with: lives with their spouse Lives in: House/apartment Stairs: Yes: External: 5-6 steps; on left going up Has following equipment at home: Walker - 2 wheeled, shower chair, and hiking/walking poles  OCCUPATION: Retired Pharmacist, hospital  PLOF: Independent and Leisure: walking daily for total of ~1.5 miles; working in the yard; sewing/quilting    PATIENT GOALS: "Be able to move and do things in the house and yard w/o worrying about falling."   OBJECTIVE:   DIAGNOSTIC FINDINGS:  08/27/22 - Lumbar spine x-ray completed as part of postop series - results pending   07/01/22 - Lumbar spine x-ray: Expected postoperative appearance related to L2-L3 fixation.   06/12/21 - Lumbar MRI: Postoperative and degenerative changes of the lumbar spine as described, similar compared to July 28, 2020. At L2-L3, there is moderate spinal canal stenosis due to disc bulging, endplate osteophytic spurring, facet and ligamentum flavum hypertrophy. Milder spinal canal stenosis is seen at L3-L4. There is multilevel neural foraminal stenosis.   PATIENT SURVEYS:  Modified Oswestry 22 / 50 = 44.0%, 23 / 50 = 46.0% (10/15/22)   SCREENING FOR RED FLAGS: Bowel or bladder incontinence: Yes: bladder - takes Vesicare Spinal tumors: No Cauda equina syndrome: No Compression fracture: No Abdominal aneurysm:  No  COGNITION:  Overall cognitive status: Within functional limits for tasks assessed    SENSATION:  WFL - Pt reports LE sensation has improved since surgery  MUSCLE LENGTH: NT  POSTURE:  rounded shoulders, forward head, and right pelvic obliquity  PALPATION: NT  LUMBAR ROM: Deferred on eval pending MD clearance to release her from post-surgical precautions   Active  AROM  10/08/22 *  Flexion Hands to mid shin  Extension 50% limited  Right lateral flexion Hand to lateral knee  Left lateral flexion Hand to lateral knee  Right rotation 50% limited  Left rotation 50% limited  (Blank rows = not tested)  (* - ROM assessment limited in standing due to balance deficits)  LOWER EXTREMITY ROM:    Grossly WFL  LOWER EXTREMITY MMT:  (tested in sitting on eval, traditional testing positions on 10/08/22)  MMT Right eval Left eval Right 10/08/22 Left 10/08/22  Hip flexion 4 4 4 4  $ Hip extension 4- 4- 4- 3-  Hip abduction 4+ 4+ 4+ 4  Hip adduction 4+ 4+ 3+ 4  Hip internal rotation 4- 4- 4 4  Hip external rotation 3+ 3+ 4- 3+  Knee flexion 4+ 4+ 5 5  Knee extension 4+ 4+ 4+ 4+  Ankle dorsiflexion 3- 3- 3 3-  Ankle plantarflexion 4- NWB 4- NWB 4+ NWB 4 NWB  Ankle inversion 3- 2- 3+ 2  Ankle eversion 4- 3+ 4 3+   (Blank rows = not tested)  LUMBAR SPECIAL TESTS:  NT  FUNCTIONAL TESTS:  5 times sit to stand: 22.19 sec; 22.87 sec (10/08/22) Timed up and go (TUG): 14.84 sec with hiking pole (08/28/22); 13.44 sec with hiking pole, 13.78 sec w/o AD (10/08/22) 10 meter walk test: 15.47 sec with hiking pole; Gait speed = 2.12 ft/sec (08/28/22); 16.03 sec with hiking pole, 16.19 sec w/o AD; Gait speed = 2.05 ft/sec with hiking pole, 2.03 ft/sec w/o AD(10/08/22) Berg Balance Scale: 27/56; < 36 high risk for falls (close to 100%); 36/56 (10/08/22)  Functional gait assessment: 8/30; < 19 = high risk fall (08/28/22), 18/30 (10/15/22) Left hip Trendelenburg evident in right SLS  GAIT: Distance  walked: 60 Assistive device utilized:  single walking/hiking pole on R Level of assistance: SBA Gait pattern:  increased sway, step through pattern, decreased stride length, trendelenburg, and lateral hip instability Comments: L>R foot slap   TODAY'S TREATMENT:   10/29/22 THERAPEUTIC EXERCISE: to improve flexibility, strength and mobility.  Verbal and tactile cues throughout for technique. Rec bike - L4 x 4 min, L3 x 2 min (seat #3) Standing YTB 4-way SLR x 10 each direction, B UE support on back of chair for balance - cues for neutral hip/LE alignment and to minimize UB/UE lean into chair Standing hip hinge with pole along spine x 10, SBA of PT with min A for LOB x 1 R/L hip hike from SLS on 2" step 2 x 10, single UE support on counter - VC and TC along with manual facilitation required to initiate movement pattern  THERAPEUTIC ACTIVITIES: Reviewed education in proper posture and body mechanics for household chores such as cleaning bathroom to minimize strain on low back and neck.   NEUROMUSCULAR RE-EDUCATION: To improve balance, proprioception, coordination, and reduce fall risk. Tandem stance 2 x 20 sec bil   10/24/22 THERAPEUTIC EXERCISE: to improve flexibility, strength and mobility.  Verbal and tactile cues throughout for technique. NuStep- L5 x 6 min  Standing 4-way hip YTB 1x10 each bilat, B UE support on back of chair for balance Seated Horizontal Abduction x15 RTB  Seated Diagonals x10 RTB  Standing TrA + GTB rows 2 x 10, close guarding/SBA of PT Standing TrA + GTB retraction + shoulder extension to neutral x 10, close guarding/SBA of PT  NEUROMUSCULAR RE-EDUCATION: To improve posture, balance, proprioception, coordination, and reduce fall risk.  Dot 5-way with slider x2 each  Cone knock down/righting     10/22/22 THERAPEUTIC EXERCISE: to improve flexibility, strength and mobility.  Verbal and tactile cues throughout for technique. Rec bike - L3 x 6 min  Functional  squat position with B shoulder OH flexion 5 x 5", 2 sets (hips lightly touching bolster on mat table for assist with balance)  Standing TrA + GTB rows 2 x 10, close guarding/SBA of PT Standing TrA + GTB retraction + shoulder extension to neutral x 10, close guarding/SBA of PT Standing Modified Tandem TrA + RTB pallof press x 10 bil, normal stance x 10 bilat; close guarding  NEUROMUSCULAR RE-EDUCATION: To improve posture, balance, proprioception, coordination, and reduce fall risk.  Dot 5-way with [employer]  [employer] pews (ant/post wt shift) w/ bolster behind pt targeting hip and ankle balance strategies x10 - SBA/CGA of PT with intermittent UE support on PT's arms   PATIENT EDUCATION:  Education details: progress with PT Person educated: Patient Education method: Explanation Education comprehension: verbalized understanding  HOME EXERCISE PROGRAM: Access Code: [email or web address] URL: [email or web address] Date: 09/13/2022 Prepared by: Annie Paras  Exercises - Seated Heel Toe Raises  - 1 x daily - 7 x weekly - 2 sets - 10 reps - 3 sec hold - Standing Isometric Hip Abduction with Ball on Wall  - 1 x daily - 7 x weekly - 2 sets - 10 reps - 3 sec hold - Seated Transversus Abdominis Bracing  - 2 x daily - 7 x weekly - 2 sets - 10 reps - 3-5 sec hold - Seated Isometric Hip Abduction with Resistance  - 1 x daily - 3 x weekly - 2 sets - 10 reps - 3 sec hold - Seated March with Resistance  - 1 x daily - 3 x weekly - 2 sets - 10 reps - 3 sec hold - Seated Shoulder Row with Anchored Resistance  - 1 x daily - 3 x weekly - 2 sets - 10 reps - 3-5 hold hold - Seated Single Arm Shoulder Row with Anchored Resistance  - 1 x daily - 3 x weekly - 2 sets - 10 reps - 3 sec hold - Seated Anti-Rotation Press With Anchored Resistance  - 1 x daily - 3 x weekly - 2 sets - 10 reps - 3 sec hold   ASSESSMENT:  CLINICAL IMPRESSION:  Angelyna reports some increased back pain/soreness after cleaning the  sinks and toilets in hers and her husband's bathroom, likely from leaning forward while cleaning - discussed postural awareness and strategies for neutral spine during tasks to minimize back strain including hip hinge and mini squat as well as opening the cabinet door and resting on the lip of the lower cabinet edge to provide support into forward lean.  Worked on hip hinge using post along back/spine to maintain neutral spine and encourage flexion at hips - patient having difficulty with this due to balance with 1 minor LOB requiring PT to assist for correction.  Continued proximal lower extremity strengthening targeting hip musculature with patient continuing to have some difficulty during resisted 4-way SLR due to Trendelenburg hip drop therefore worked on isolating hip hike control.  Memori continues to note progress with activity tolerance, reporting able  to walk 1/2 mile yesterday with her hiking pole.  She will continue to benefit from skilled PT to further improve her core and proximal lower extremity strength as well as balance and proprioception to improve her safety with mobility and gait and decrease fall risk.  OBJECTIVE IMPAIRMENTS: Abnormal gait, decreased activity tolerance, decreased balance, decreased coordination, decreased endurance, decreased mobility, difficulty walking, decreased ROM, decreased strength, decreased safety awareness, impaired perceived functional ability, impaired flexibility, improper body mechanics, postural dysfunction, and pain.   ACTIVITY LIMITATIONS: carrying, lifting, bending, sitting, standing, squatting, sleeping, stairs, transfers, bed mobility, bathing, toileting, dressing, reach over head, locomotion level, and caring for others  PARTICIPATION LIMITATIONS: meal prep, cleaning, laundry, interpersonal relationship, driving, shopping, community activity, yard work, and church  PERSONAL FACTORS: Age, Past/current experiences, Time since onset of  injury/illness/exacerbation, and 3+ comorbidities: C5-7 ACDF 09/21/20; idiopathic progressive neuropathy; hypthyroidism; DDD; remote h/o back surgery at age 56; h/o scoliosis; Takotsubo cardiomyopathy 01/10/22   are also affecting patient's functional outcome.   REHAB POTENTIAL: Good  CLINICAL DECISION MAKING: Evolving/moderate complexity  EVALUATION COMPLEXITY: Moderate   GOALS: Goals reviewed with patient? Yes  SHORT TERM GOALS: Target date: 09/23/2022  Patient will be independent with initial HEP to improve outcomes and carryover.  Baseline:  Goal status: MET  09/13/22  2.  Complete balance assessment and update LTGs as indicated.  Baseline:  Goal status: MET  08/28/22  LONG TERM GOALS: Target date: 10/21/2022, extended to 12/03/2022   Patient will be independent with ongoing/advanced HEP for self-management at home.  Baseline:  Goal status: IN PROGRESS  10/29/22 - met for current HEP  2.  Patient to demonstrate ability to achieve and maintain good spinal alignment/posturing and body mechanics needed for daily activities. Baseline:  Goal status: MET  10/08/22 - pt demonstrates good awareness of neutral spine alignment; 10/29/22 - refresher indicated in relation to household cleaning tasks  3.  Patient will demonstrate functional pain free lumbar ROM to perform ADLs.   Baseline:  Goal status: IN PROGRESS  10/08/22 - limited in all directions with standing balance impacting assessment  4.  Patient will demonstrate improved B proximal LE strength to >/= 4 to 4+/5 for improved stability and ease of mobility. Baseline: refer to MMT table above Goal status: IN PROGRESS  10/08/22 - knee strength improving, but continued hip weakness (now more evident when pt able to assume standardized MMT testing positions vs testing in sitting on eval)  5  Patient will demonstrate improved B ankle strength to >/= 3+ to 4-/5 for improved gait stability. Baseline: refer to MMT table above Goal status: IN  PROGRESS  10/08/22 - overall ankle strength improving by at least 1/2 grade  6.  Patient will ambulate with improved gait pattern with decreased B foot drop/slap and no evidence of instability/LOB. Baseline:  Goal status: IN PROGRESS  10/08/22 - Pt reports no foot slap ~2/3 of the time when walking, but it becomes more evident with fatigue. Gait remains somewhat ataxic.  7. Patient will report </= 32% on Modified Oswestry to demonstrate improved functional ability with decreased pain interference. Baseline: 22 / 50 = 44.0% (eval); 23 / 50 = 46.0% (10/15/22) Goal status: IN PROGRESS  10/15/22 - 23 / 50 = 46.0%  8.  Patient will improve Berg score to >/= 36/56 to improve safety and stability with ADLs in standing and reduce risk for falls. Baseline: 27/56 Goal status: MET and REVISED (see 8a.below) 10/08/22 - Berg = 36/56  8a.  Patient  will improve Berg score to >/= 44/56 to improve safety and stability with ADLs in standing and reduce risk for falls. Baseline: 36/56 (10/08/22) Goal status: REVISED   9.  Patient will improve FGA score to >/= 19/30 to improve gait stability and reduce risk for falls. Baseline: 8/30 (08/28/22); 18/30 (10/15/22) Goal status: IN PROGRESS  10/15/22 - FGA = 18/30   PLAN:  PT FREQUENCY: 2x/week  PT DURATION: 6-8 weeks  PLANNED INTERVENTIONS: Therapeutic exercises, Therapeutic activity, Neuromuscular re-education, Balance training, Gait training, Patient/Family education, Self Care, Joint mobilization, DME instructions, Aquatic Therapy, Dry Needling, Electrical stimulation, Cryotherapy, Moist heat, scar mobilization, Taping, Ultrasound, Ionotophoresis 45m/ml Dexamethasone, Manual therapy, and Re-evaluation - [id number] for E-stim, but [id number] (E-stim) not approved [facility]  PLAN FOR NEXT SESSION: Reassess strength; update goal assessment; continue core/lumbar strengthening; balance training and gait stability, SLS activities that focus on  bringing foot onto different directions; update HEP as indicated   JPercival Spanish PT 10/29/2022, 5:52 PM

## 2022-10-31 ENCOUNTER — Ambulatory Visit: Payer: Medicare PPO | Admitting: Physical Therapy

## 2022-11-05 ENCOUNTER — Encounter: Payer: Self-pay | Admitting: Physical Therapy

## 2022-11-05 ENCOUNTER — Ambulatory Visit: Payer: Medicare PPO | Admitting: Physical Therapy

## 2022-11-05 DIAGNOSIS — M5416 Radiculopathy, lumbar region: Secondary | ICD-10-CM

## 2022-11-05 DIAGNOSIS — M21372 Foot drop, left foot: Secondary | ICD-10-CM

## 2022-11-05 DIAGNOSIS — R2689 Other abnormalities of gait and mobility: Secondary | ICD-10-CM

## 2022-11-05 DIAGNOSIS — R2681 Unsteadiness on feet: Secondary | ICD-10-CM

## 2022-11-05 DIAGNOSIS — M6281 Muscle weakness (generalized): Secondary | ICD-10-CM

## 2022-11-05 NOTE — Therapy (Signed)
OUTPATIENT PHYSICAL THERAPY TREATMENT    Patient Name: [name] Trujillo MRN: [id number] DOB:1941/01/20, 82 y.o., female Today's Date: 11/05/2022  END OF SESSION:  PT End of Session - 11/05/22 1312     Visit Number 16    Date for PT Re-Evaluation 12/03/22    Authorization Type [employer]    Authorization Time Period 08/27/23 - 12/03/22: Total of 22 visits approved through 12/03/22 as of 10/22/22    Authorization - Visit Number 16    Authorization - Number of Visits 22    Progress Note Due on Visit 20    PT Start Time 1311    PT Stop Time 1400    PT Time Calculation (min) 49 min    Activity Tolerance Patient tolerated treatment well    Behavior During Therapy WFL for tasks assessed/performed                    Past Medical History:  Diagnosis Date   High cholesterol    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   She had ACUTE TAKOTSUBO (STRESS) CARDIOMYOPATHY with elevated Troponin Levels - this would be considered "Demand Ischemia - Demand Infarction" & NOT associated with ACS/CAD.     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  - Cath with NO CAD. ==> RESOLVED: f/u Echo 03/2022: EF 60-65%.   Past Surgical History:  Procedure Laterality Date   APPENDECTOMY     17-18 yo   BACK SURGERY     Age 75   CESAREAN SECTION     ECTOPIC PREGNANCY SURGERY     LAPAROSCOPIC HYSTERECTOMY     LEFT HEART CATH AND CORONARY ANGIOGRAPHY N/A 01/09/2022   Procedure: LEFT HEART CATH AND CORONARY ANGIOGRAPHY;  Surgeon: Sherren Mocha, MD;  Location: [facility];  Service: CV:: Widely patent coronaries with mild nonobstructive LAD plaquing.  Right dominant system.  Normal LVEDP.  Based  on clinical presentation, findings are consistent with acute Takotsubo Cardiomyopathy Syndrome   POSTERIOR FUSION LUMBAR SPINE  05/24/2022   ([place]; [name], MD): L2-L3 XLIF, L2-L3 POSTERIOR DECOMPRESSION AND FUSION   TRANSTHORACIC ECHOCARDIOGRAM  01/08/2022   Severely decreased LV function-EF 25 to 30%.  Mid to apical (mostly anterior) with normal basal motion.  GR 2 DD-elevated LAP.  Mildly dilated LA.  Aortic sclerosis with no stenosis.  No AI.  Normal MV with mild to moderate TR.  Mildly elevated RAP, and PAP (estimated 49 mg). If LAD CAD ruled out - consistent with Takotsubo CM Syndrome.   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    PCP: Derinda Late, MD   REFERRING PROVIDER: Dorena Dew, NP for Hermenia Fiscal, MD  REFERRING DIAG: Z98.1 (ICD-10-CM) - S/P lumbar spinal fusion   THERAPY DIAG:  Muscle weakness (generalized)  Radiculopathy, lumbar region  Other abnormalities of gait and mobility  Unsteadiness on feet  Foot drop, left  RATIONALE FOR EVALUATION AND TREATMENT: Rehabilitation  ONSET DATE: 05/24/2022 - L2-3 XLIF/PSF   NEXT MD VISIT: September 2024    SUBJECTIVE:                                                                                                                                                                                                         SUBJECTIVE STATEMENT:  No pain or aches today. Was able to walk  yesterday for ~20 minutes by herself.   PAIN: Are you having pain? No  PERTINENT HISTORY:  L2-3 XLIF/PSF 05/24/22; C5-7 ACDF 09/21/20; idiopathic progressive neuropathy; hypthyroidism; DDD; remote h/o back surgery at age 6; h/o scoliosis; Takotsubo cardiomyopathy 01/10/22   PRECAUTIONS: Back - No BLT, TLSO discontinued as of 09/11/22  WEIGHT BEARING RESTRICTIONS: No  FALLS:  Has patient fallen in last 6 months? No  LIVING ENVIRONMENT: Lives with: lives with their spouse Lives in: House/apartment Stairs: Yes: External: 5-6 steps; on left going up Has following equipment at home: Walker - 2 wheeled, shower chair, and hiking/walking poles  OCCUPATION: Retired Pharmacist, hospital  PLOF: Independent and Leisure: walking daily for total of ~1.5 miles; working in the yard; sewing/quilting    PATIENT GOALS: "Be able to move and do things in the house and yard w/o worrying about falling."   OBJECTIVE:   DIAGNOSTIC FINDINGS:  08/27/22 - Lumbar spine x-ray completed as part of postop series - results pending   07/01/22 - Lumbar spine x-ray: Expected postoperative appearance related to L2-L3 fixation.   06/12/21 - Lumbar MRI: Postoperative and degenerative changes of the lumbar spine as described, similar compared to July 28, 2020. At L2-L3, there is moderate spinal canal stenosis due to disc bulging, endplate osteophytic spurring, facet and ligamentum flavum hypertrophy. Milder spinal canal stenosis is seen at L3-L4. There is multilevel neural foraminal stenosis.   PATIENT SURVEYS:  Modified Oswestry 22 / 50 = 44.0%, 23 / 50 = 46.0% (10/15/22)   SCREENING FOR RED FLAGS: Bowel or bladder incontinence: Yes: bladder - takes Vesicare Spinal tumors: No Cauda equina syndrome: No Compression fracture: No Abdominal aneurysm: No  COGNITION:  Overall cognitive status: Within functional limits for tasks assessed    SENSATION: WFL - Pt reports LE sensation has improved since surgery  MUSCLE  LENGTH: NT  POSTURE:  rounded shoulders, forward head, and right pelvic obliquity  PALPATION: NT  LUMBAR ROM: Deferred on eval pending MD clearance to release her from post-surgical precautions   Active  AROM  10/08/22 * AROM  11/05/22  Flexion Hands to mid shin Hands to mid shin   Extension 50% limited Limited 25%   Right lateral flexion Hand to lateral knee Hand to lateral knee  Left lateral flexion Hand to lateral knee Hand to lateral knee  Right rotation 50% limited Limited due to balance   Left rotation 50% limited WNL  (Blank rows = not tested)  (* - ROM assessment limited in standing due to balance deficits)  LOWER EXTREMITY ROM:    Grossly WFL  LOWER EXTREMITY MMT:  (tested in sitting on eval, traditional testing positions on 10/08/22)  MMT Right eval Left eval Right 10/08/22 Left 10/08/22 Right 11/05/22 Left  11/05/22  Hip flexion '4 4 4 4 4 4  '$ Hip extension 4- 4- 4- 3- 4+ 4+  Hip abduction 4+ 4+ 4+ 4 4+ 4+  Hip adduction 4+ 4+ 3+ 4 3+ 4  Hip internal rotation 4- 4- '4 4 4 4  '$ Hip external rotation 3+ 3+ 4- 3+ 4 4  Knee flexion 4+ 4+ '5 5 5 5  '$ Knee extension 4+ 4+ 4+ 4+ 4+ 4+  Ankle dorsiflexion 3- 3- 3 3- 3 3  Ankle plantarflexion 4- NWB 4- NWB 4+ NWB 4 NWB 4+ NWB 4+ NWB  Ankle inversion 3- 2- 3+ 2 4 2+  Ankle eversion 4- 3+ 4 3+ 4 4   (Blank rows = not tested)  LUMBAR SPECIAL TESTS:  NT  FUNCTIONAL TESTS:  5 times sit to stand: 22.19 sec; 22.87 sec (10/08/22) Timed up and go (TUG): 14.84 sec with hiking pole (08/28/22); 13.44 sec with hiking pole, 13.78 sec w/o AD (10/08/22), 11.16 seconds no hiking pole (11/05/22)  10 meter walk test: 15.47 sec with hiking pole; Gait speed = 2.12 ft/sec (08/28/22); 16.03 sec with hiking pole, 16.19 sec w/o AD; Gait speed = 2.05 ft/sec with hiking pole, 2.03 ft/sec w/o AD(10/08/22), 14.88 seconds no hiking pole (11/05/22)  Berg Balance Scale: 27/56; < 36 high risk for falls (close to 100%); 36/56 (10/08/22)  Functional gait  assessment: 8/30; < 19 = high risk fall (08/28/22), 18/30 (10/15/22), 22/30 no hiking pole (11/05/22) Left hip Trendelenburg evident in right SLS  GAIT: Distance walked: 60 Assistive device utilized:  single walking/hiking pole on R Level of assistance: SBA Gait pattern:  increased sway, step through pattern, decreased stride length, trendelenburg, and lateral hip instability Comments: L>R foot slap   TODAY'S TREATMENT:   11/05/22 THERAPEUTIC EXERCISE: to improve flexibility, strength and mobility.  Verbal and tactile cues throughout for technique. Rec bike - L4 x 5 min, L3 x 1 min (seat #3)  THERAPEUTIC ACTIVITIES: ROM/MMT Re-Assessment: see chart above  FGA: 22/30  TUG: 11.16 seconds  10MWT: 14.88 seconds   10/29/22 THERAPEUTIC EXERCISE: to improve flexibility, strength and mobility.  Verbal and tactile cues throughout for technique. Rec bike - L4 x 4 min, L3 x 2 min (seat #3) Standing YTB 4-way SLR x 10 each direction, B UE support on back of chair for balance - cues for neutral hip/LE alignment and to minimize UB/UE lean into chair Standing hip hinge with pole along spine x 10, SBA of PT with min A for LOB x 1 R/L hip hike from SLS on 2" step 2 x 10, single UE support on counter - VC and TC along with manual facilitation required to initiate  movement pattern  THERAPEUTIC ACTIVITIES: Reviewed education in proper posture and body mechanics for household chores such as cleaning bathroom to minimize strain on low back and neck.   NEUROMUSCULAR RE-EDUCATION: To improve balance, proprioception, coordination, and reduce fall risk. Tandem stance 2 x 20 sec bil   10/24/22 THERAPEUTIC EXERCISE: to improve flexibility, strength and mobility.  Verbal and tactile cues throughout for technique. NuStep- L5 x 6 min  Standing 4-way hip YTB 1x10 each bilat, B UE support on back of chair for balance Seated Horizontal Abduction x15 RTB  Seated Diagonals x10 RTB  Standing TrA + GTB rows 2 x 10,  close guarding/SBA of PT Standing TrA + GTB retraction + shoulder extension to neutral x 10, close guarding/SBA of PT  NEUROMUSCULAR RE-EDUCATION: To improve posture, balance, proprioception, coordination, and reduce fall risk.  Dot 5-way with slider x2 each  Cone knock down/righting      PATIENT EDUCATION:  Education details: progress with PT and ongoing PT POC Person educated: Patient Education method: Explanation Education comprehension: verbalized understanding  HOME EXERCISE PROGRAM: Access Code: [email or web address] URL: [email or web address] Date: 09/13/2022 Prepared by: Annie Paras 50  Exercises - Seated Heel Toe Raises  - 1 x daily - 7 x weekly - 2 sets - 10 reps - 3 sec hold - Standing Isometric Hip Abduction with Ball on Wall  - 1 x daily - 7 x weekly - 2 sets - 10 reps - 3 sec hold - Seated Transversus Abdominis Bracing  - 2 x daily - 7 x weekly - 2 sets - 10 reps - 3-5 sec hold - Seated Isometric Hip Abduction with Resistance  - 1 x daily - 3 x weekly - 2 sets - 10 reps - 3 sec hold - Seated March with Resistance  - 1 x daily - 3 x weekly - 2 sets - 10 reps - 3 sec hold - Seated Shoulder Row with Anchored Resistance  - 1 x daily - 3 x weekly - 2 sets - 10 reps - 3-5 hold hold - Seated Single Arm Shoulder Row with Anchored Resistance  - 1 x daily - 3 x weekly - 2 sets - 10 reps - 3 sec hold - Seated Anti-Rotation Press With Anchored Resistance  - 1 x daily - 3 x weekly - 2 sets - 10 reps - 3 sec hold   ASSESSMENT:  CLINICAL IMPRESSION:  Pt came into today's session with no pain or discomfort. Pt was able to talk a walk yesterday for 20 minutes without any problems. Re-assessment of LTG was conducted today to reflect the progress the pt has made over the last month. MMT showed increase in strength with in some proximal hip muscles but pt still continues to struggle with L ankle muscle activation and strength. Pt's balance has improved as well with her FGA being a  22/30 which indicates she is now a medium fall risk. There is still room for improvement so her LTG was revised to properly reflect that. Pt's gait testing has improved and it is important to note that she is now conducting them without her hiking pole. Pt continues to benefit from skilled PT interventions to address her balance difficulties and strength deficits to improve her L foot clearance and overall endurance to different activities.   OBJECTIVE IMPAIRMENTS: Abnormal gait, decreased activity tolerance, decreased balance, decreased coordination, decreased endurance, decreased mobility, difficulty walking, decreased ROM, decreased strength, decreased safety awareness, impaired perceived functional ability, impaired flexibility, improper body mechanics, postural dysfunction,  and pain.   ACTIVITY LIMITATIONS: carrying, lifting, bending, sitting, standing, squatting, sleeping, stairs, transfers, bed mobility, bathing, toileting, dressing, reach over head, locomotion level, and caring for others  PARTICIPATION LIMITATIONS: meal prep, cleaning, laundry, interpersonal relationship, driving, shopping, community activity, yard work, and church  PERSONAL FACTORS: Age, Past/current experiences, Time since onset of injury/illness/exacerbation, and 3+ comorbidities: C5-7 ACDF 09/21/20; idiopathic progressive neuropathy; hypthyroidism; DDD; remote h/o back surgery at age 67; h/o scoliosis; Takotsubo cardiomyopathy 01/10/22   are also affecting patient's functional outcome.   REHAB POTENTIAL: Good  CLINICAL DECISION MAKING: Evolving/moderate complexity  EVALUATION COMPLEXITY: Moderate   GOALS: Goals reviewed with patient? Yes  SHORT TERM GOALS: Target date: 09/23/2022  Patient will be independent with initial HEP to improve outcomes and carryover.  Baseline:  Goal status: MET  09/13/22  2.  Complete balance assessment and update LTGs as indicated.  Baseline:  Goal status: MET  08/28/22  LONG TERM  GOALS: Target date: 10/21/2022, extended to 12/03/2022   Patient will be independent with ongoing/advanced HEP for self-management at home.  Baseline:  Goal status: IN PROGRESS  11/05/22 - met for current HEP  2.  Patient to demonstrate ability to achieve and maintain good spinal alignment/posturing and body mechanics needed for daily activities. Baseline:  Goal status: MET  10/08/22 - pt demonstrates good awareness of neutral spine alignment; 10/29/22 - refresher indicated in relation to household cleaning tasks  3.  Patient will demonstrate functional pain free lumbar ROM to perform ADLs.   Baseline:  Goal status: IN PROGRESS  11/05/22 - limited in all directions with standing balance impacting assessment  4.  Patient will demonstrate improved B proximal LE strength to >/= 4 to 4+/5 for improved stability and ease of mobility. Baseline: refer to MMT table above Goal status: IN PROGRESS  11/05/22 - knee strength improving, but continued hip weakness   5  Patient will demonstrate improved B ankle strength to >/= 3+ to 4-/5 for improved gait stability. Baseline: refer to MMT table above Goal status: IN PROGRESS  11/05/22   6.  Patient will ambulate with improved gait pattern with decreased B foot drop/slap and no evidence of instability/LOB. Baseline:  Goal status: IN PROGRESS  11/05/22 - Pt reports no foot slap ~2/3 of the time when walking, but it becomes more evident with fatigue. Gait remains somewhat ataxic.  7. Patient will report </= 32% on Modified Oswestry to demonstrate improved functional ability with decreased pain interference. Baseline: 22 / 50 = 44.0% (eval); 23 / 50 = 46.0% (10/15/22) Goal status: MET  11/05/22:11 / 50 = 22.0 %  8.  Patient will improve Berg score to >/= 36/56 to improve safety and stability with ADLs in standing and reduce risk for falls. Baseline: 27/56 Goal status: MET and REVISED (see 8a.below) 10/08/22 - Berg = 36/56  8a.  Patient will improve Berg score to  >/= 44/56 to improve safety and stability with ADLs in standing and reduce risk for falls. Baseline: 36/56 (10/08/22) Goal status: REVISED   9.  Patient will improve FGA score to >/= 19/30 to improve gait stability and reduce risk for falls. Baseline: 8/30 (08/28/22); 18/30 (10/15/22) Goal status: MET  and REVISED (see 9a below) 11/05/22 - FGA = 22/30    9a.  Pt will improve FGA score to >/= 24/30 to improve gait stability and reduce risk for falls. Baseline:  Goal status: REVISED 11/05/22     PLAN:  PT FREQUENCY: 2x/week  PT DURATION: 6-8 weeks  PLANNED INTERVENTIONS: Therapeutic exercises, Therapeutic activity, Neuromuscular re-education, Balance training, Gait training, Patient/Family education, Self Care, Joint mobilization, DME instructions, Aquatic Therapy, Dry Needling, Electrical stimulation, Cryotherapy, Moist heat, scar mobilization, Taping, Ultrasound, Ionotophoresis '4mg'$ /ml Dexamethasone, Manual therapy, and Re-evaluation [id number] approved for E-stim, but G0283 (E-stim) not approved per [facility] prior authorization  PLAN FOR NEXT SESSION: Update HEP as indicated,continue core/lumbar strengthening; balance training and gait stability, SLS activities that focus on bringing foot onto different directions  Iyani Dresner Romero-Perozo, Student-PT 11/05/2022, 2:11 PM

## 2022-11-07 ENCOUNTER — Ambulatory Visit: Payer: Medicare PPO | Admitting: Physical Therapy

## 2022-11-07 ENCOUNTER — Encounter: Payer: Self-pay | Admitting: Physical Therapy

## 2022-11-07 DIAGNOSIS — R2689 Other abnormalities of gait and mobility: Secondary | ICD-10-CM

## 2022-11-07 DIAGNOSIS — M6281 Muscle weakness (generalized): Secondary | ICD-10-CM

## 2022-11-07 DIAGNOSIS — M5416 Radiculopathy, lumbar region: Secondary | ICD-10-CM | POA: Diagnosis not present

## 2022-11-07 DIAGNOSIS — R2681 Unsteadiness on feet: Secondary | ICD-10-CM

## 2022-11-07 IMAGING — CR DG CERVICAL SPINE 2 OR 3 VIEWS
2 series · 2 of 2 positions shown · non-contrast
Comparison: 10/26/2020

CLINICAL DATA: 79-year-old female 12 weeks after ACDF.

EXAM:
CERVICAL SPINE - 2-3 VIEW

[w cervical spine lat]
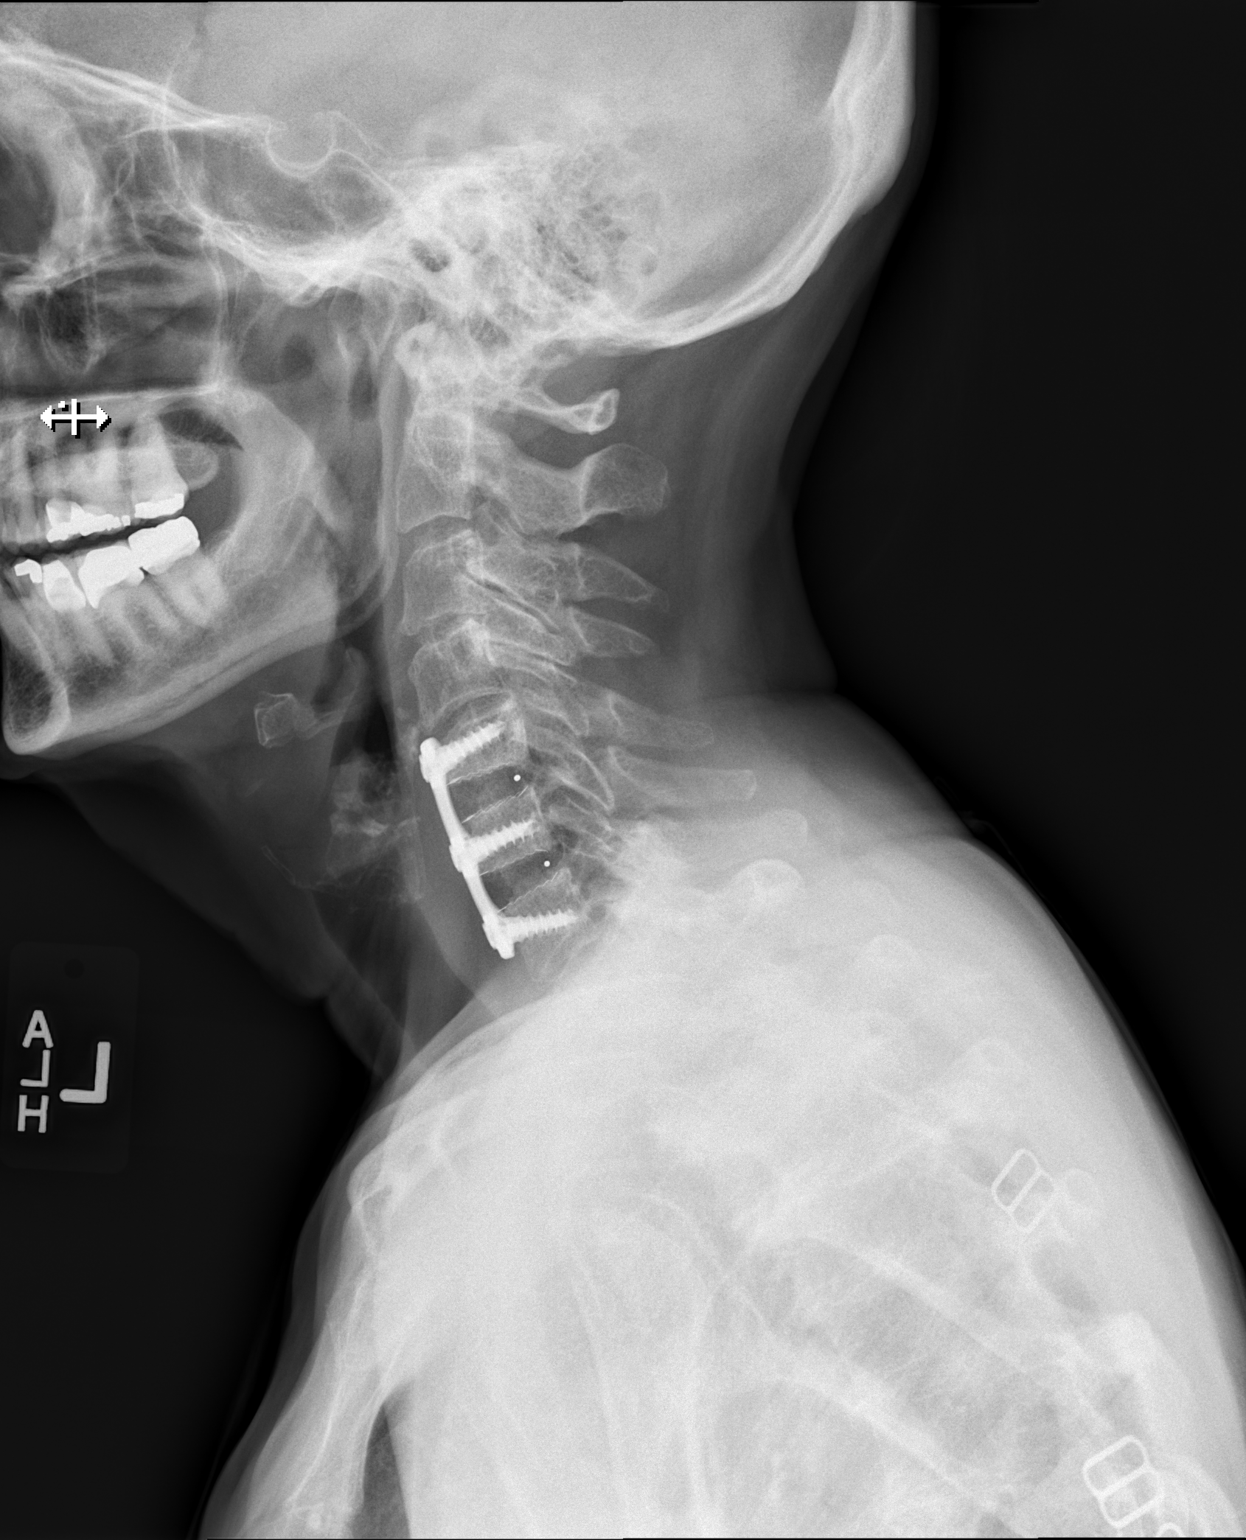

[w cervical spine ap]
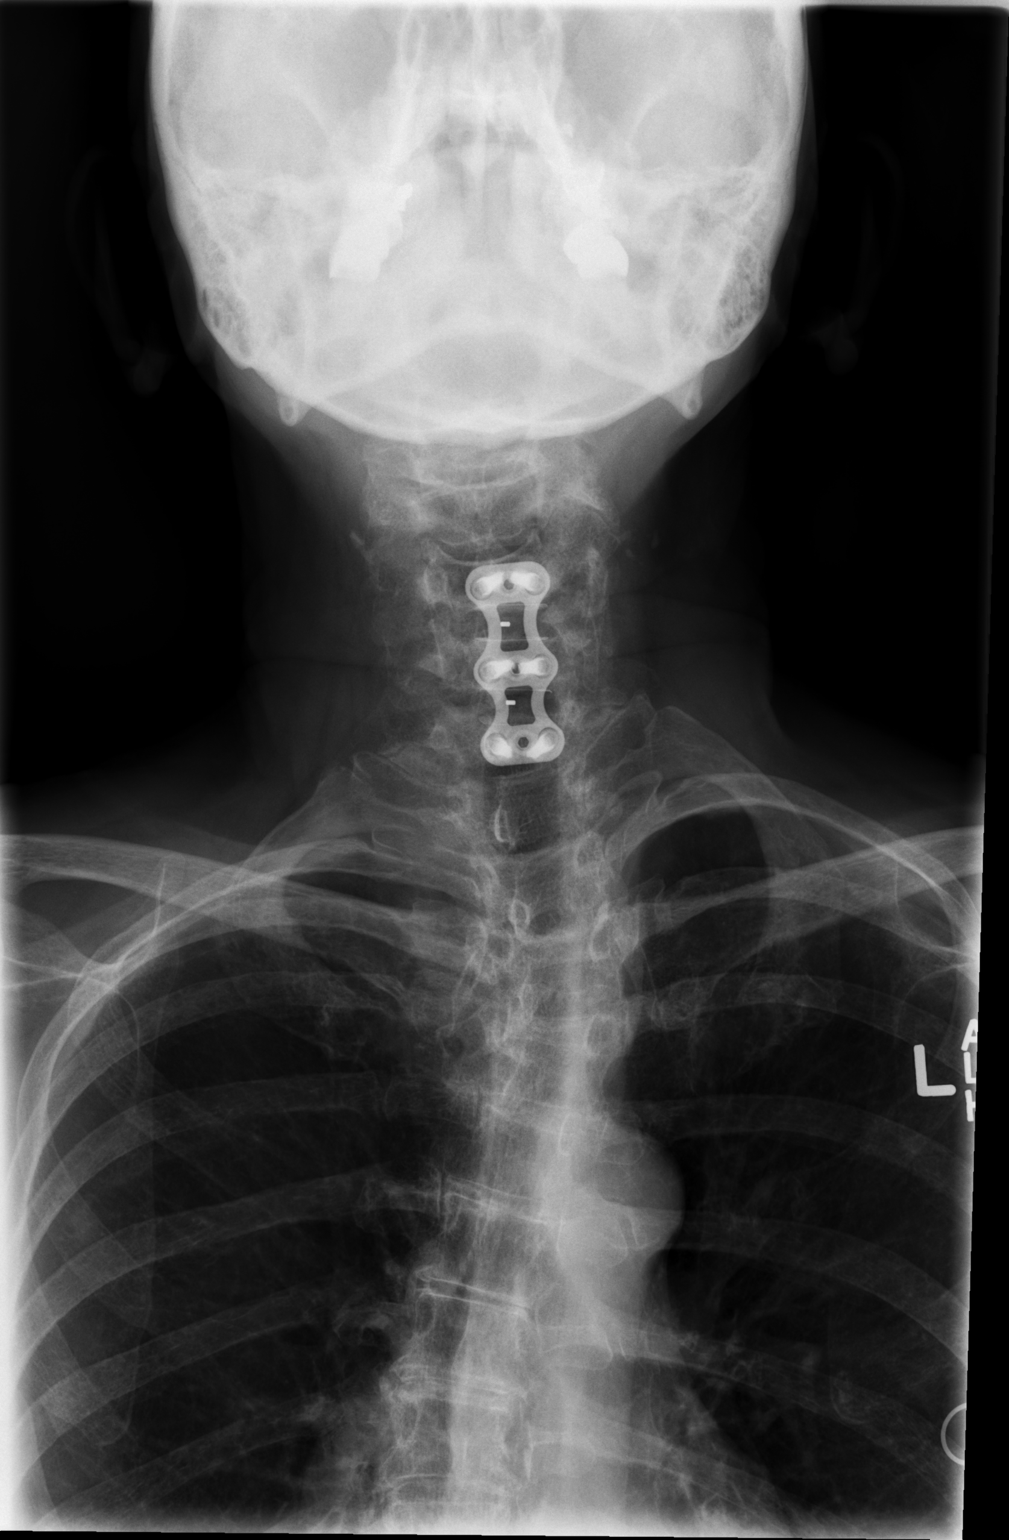

[2 of 2 positions shown; findings below may reference images not displayed]

FINDINGS: Postsurgical changes after C5-C7 anterior cervical discectomy and
fusion without evidence of hardware loosening, fracture, or
malalignment. Vertebral body heights are maintained. No
malalignment. The retropharyngeal soft tissues and visualized lung
apices are within normal limits.
IMPRESSION: Postsurgical changes after C5-C7 ACDF without complicating features.

## 2022-11-07 NOTE — Therapy (Signed)
OUTPATIENT PHYSICAL THERAPY TREATMENT    Patient Name: [name] Trujillo MRN: [id number] DOB:02/01/1941, 82 y.o., female Today's Date: 11/07/2022  END OF SESSION:  PT End of Session - 11/07/22 1107     Visit Number 17    Date for PT Re-Evaluation 12/03/22    Authorization Type [employer]    Authorization Time Period 08/27/23 - 12/03/22: Total of 22 visits approved through 12/03/22 as of 10/22/22    Authorization - Visit Number 72    Authorization - Number of Visits 22    Progress Note Due on Visit 20    Activity Tolerance Patient tolerated treatment well    Behavior During Therapy [facility] for tasks assessed/performed                    Past Medical History:  Diagnosis Date   High cholesterol    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   She had ACUTE TAKOTSUBO (STRESS) CARDIOMYOPATHY with elevated Troponin Levels - this would be considered "Demand Ischemia - Demand Infarction" & NOT associated with ACS/CAD.     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  - Cath with NO CAD. ==> RESOLVED: f/u Echo 03/2022: EF 60-65%.   Past Surgical History:  Procedure Laterality Date   APPENDECTOMY     17-18 yo   BACK SURGERY     Age 20   CESAREAN SECTION     ECTOPIC PREGNANCY SURGERY     LAPAROSCOPIC HYSTERECTOMY     LEFT HEART CATH AND CORONARY ANGIOGRAPHY N/A 01/09/2022   Procedure: LEFT HEART CATH AND CORONARY ANGIOGRAPHY;  Surgeon: Sherren Mocha, MD;  Location: [facility];  Service: CV:: Widely patent coronaries with mild nonobstructive LAD plaquing.  Right dominant system.  Normal LVEDP.  Based on clinical presentation, findings are consistent with acute Takotsubo  Cardiomyopathy Syndrome   POSTERIOR FUSION LUMBAR SPINE  05/24/2022   ([place]; [name], MD): L2-L3 XLIF, L2-L3 POSTERIOR DECOMPRESSION AND FUSION   TRANSTHORACIC ECHOCARDIOGRAM  01/08/2022   Severely decreased LV function-EF 25 to 30%.  Mid to apical (mostly anterior) with normal basal motion.  GR 2 DD-elevated LAP.  Mildly dilated LA.  Aortic sclerosis with no stenosis.  No AI.  Normal MV with mild to moderate TR.  Mildly elevated RAP, and PAP (estimated 49 mg). If LAD CAD ruled out - consistent with Takotsubo CM Syndrome.   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    PCP: Derinda Late, MD   REFERRING PROVIDER: Dorena Dew, NP for Hermenia Fiscal, MD   REFERRING DIAG: Z98.1 (ICD-10-CM) - S/P lumbar spinal fusion   THERAPY DIAG:  Muscle weakness (generalized)  Radiculopathy, lumbar region  Other  abnormalities of gait and mobility  Unsteadiness on feet  RATIONALE FOR EVALUATION AND TREATMENT: Rehabilitation  ONSET DATE: 05/24/2022 - L2-3 XLIF/PSF   NEXT MD VISIT: September 2024    SUBJECTIVE:                                                                                                                                                                                                         SUBJECTIVE STATEMENT:  Pt reports she is just feeling better overall - feeling more "ok in space" when standing with better awareness of muscles behaving  normally, but this still comes and goes.   PAIN: Are you having pain? No  PERTINENT HISTORY:  L2-3 XLIF/PSF 05/24/22; C5-7 ACDF 09/21/20; idiopathic progressive neuropathy; hypthyroidism; DDD; remote h/o back surgery at age 89; h/o scoliosis; Takotsubo cardiomyopathy 01/10/22   PRECAUTIONS: Back - No BLT, TLSO discontinued as of 09/11/22  WEIGHT BEARING RESTRICTIONS: No  FALLS:  Has patient fallen in last 6 months? No  LIVING ENVIRONMENT: Lives with: lives with their spouse Lives in: House/apartment Stairs: Yes: External: 5-6 steps; on left going up Has following equipment at home: Walker - 2 wheeled, shower chair, and hiking/walking poles  OCCUPATION: Retired Pharmacist, hospital  PLOF: Independent and Leisure: walking daily for total of ~1.5 miles; working in the yard; sewing/quilting    PATIENT GOALS: "Be able to move and do things in the house and yard w/o worrying about falling."   OBJECTIVE:   DIAGNOSTIC FINDINGS:  08/27/22 - Lumbar spine x-ray completed as part of postop series - results pending   07/01/22 - Lumbar spine x-ray: Expected postoperative appearance related to L2-L3 fixation.   06/12/21 - Lumbar MRI: Postoperative and degenerative changes of the lumbar spine as described, similar compared to July 28, 2020. At L2-L3, there is moderate spinal canal stenosis due to disc bulging, endplate osteophytic spurring, facet and ligamentum flavum hypertrophy. Milder spinal canal stenosis is seen at L3-L4. There is multilevel neural foraminal stenosis.   PATIENT SURVEYS:  Modified Oswestry 22 / 50 = 44.0%, 23 / 50 = 46.0% (10/15/22)   SCREENING FOR RED FLAGS: Bowel or bladder incontinence: Yes: bladder - takes Vesicare Spinal tumors: No Cauda equina syndrome: No Compression fracture: No Abdominal aneurysm: No  COGNITION:  Overall cognitive status: Within functional limits for tasks assessed    SENSATION: WFL - Pt reports LE sensation has improved since surgery  MUSCLE  LENGTH: NT  POSTURE:  rounded shoulders, forward head, and right pelvic obliquity  PALPATION: NT  LUMBAR ROM: Deferred on eval pending MD clearance to  release her from post-surgical precautions   Active  AROM  10/08/22 * AROM  11/05/22  Flexion Hands to mid shin Hands to mid shin   Extension 50% limited Limited 25%   Right lateral flexion Hand to lateral knee Hand to lateral knee  Left lateral flexion Hand to lateral knee Hand to lateral knee  Right rotation 50% limited Limited due to balance   Left rotation 50% limited WNL  (Blank rows = not tested)  (* - ROM assessment limited in standing due to balance deficits)  LOWER EXTREMITY ROM:    Grossly WFL  LOWER EXTREMITY MMT:  (tested in sitting on eval, traditional testing positions on 10/08/22)  MMT Right eval Left eval Right 10/08/22 Left 10/08/22 Right 11/05/22 Left  11/05/22  Hip flexion '4 4 4 4 4 4  '$ Hip extension 4- 4- 4- 3- 4+ 4+  Hip abduction 4+ 4+ 4+ 4 4+ 4+  Hip adduction 4+ 4+ 3+ 4 3+ 4  Hip internal rotation 4- 4- '4 4 4 4  '$ Hip external rotation 3+ 3+ 4- 3+ 4 4  Knee flexion 4+ 4+ '5 5 5 5  '$ Knee extension 4+ 4+ 4+ 4+ 4+ 4+  Ankle dorsiflexion 3- 3- 3 3- 3 3  Ankle plantarflexion 4- NWB 4- NWB 4+ NWB 4 NWB 4+ NWB 4+ NWB  Ankle inversion 3- 2- 3+ 2 4 2+  Ankle eversion 4- 3+ 4 3+ 4 4   (Blank rows = not tested)  LUMBAR SPECIAL TESTS:  NT  FUNCTIONAL TESTS:  5 times sit to stand: 22.19 sec; 22.87 sec (10/08/22) Timed up and go (TUG): 14.84 sec with hiking pole (08/28/22); 13.44 sec with hiking pole, 13.78 sec w/o AD (10/08/22), 11.16 seconds no hiking pole (11/05/22)  10 meter walk test: 15.47 sec with hiking pole; Gait speed = 2.12 ft/sec (08/28/22); 16.03 sec with hiking pole, 16.19 sec w/o AD; Gait speed = 2.05 ft/sec with hiking pole, 2.03 ft/sec w/o AD(10/08/22), 14.88 seconds no hiking pole (11/05/22)  Berg Balance Scale: 27/56; < 36 high risk for falls (close to 100%); 36/56 (10/08/22)  Functional gait  assessment: 8/30; < 19 = high risk fall (08/28/22), 18/30 (10/15/22), 22/30 no hiking pole (11/05/22) Left hip Trendelenburg evident in right SLS  GAIT: Distance walked: 60 Assistive device utilized:  single walking/hiking pole on R Level of assistance: SBA Gait pattern:  increased sway, step through pattern, decreased stride length, trendelenburg, and lateral hip instability Comments: L>R foot slap   TODAY'S TREATMENT:   11/07/22 THERAPEUTIC EXERCISE: to improve flexibility, strength and mobility.  Verbal and tactile cues throughout for technique. Rec bike - L4 x 6 min (seat #3) TRX squat x 10 Side step over 1/2 FR with TRX support x 10 - SBA/CGA of PT for balance  R/L lateral lunge with TRX support x 10 - SBA/CGA of PT for balance  R/L lunge ("genuflect") x 10, UE support on counter & Airex pad under downward knee for cushioning if dropping too low to floor  NEUROMUSCULAR RE-EDUCATION: To improve posture, balance, proprioception, coordination, and reduce fall risk.  Alt fwd step with cross-body reach to cone atop 36" FR - CGA of PT for safety with occasional min A for LOB correction Standing on Airex pad: Multidirectional B reach to touch PT's hands - SBA with pt reaching occasionally to steady herself on PT's arms Multidirectional B reach for bean bags + toss to bucket - CGA via gait belt   11/05/22 THERAPEUTIC EXERCISE: to improve flexibility,  strength and mobility.  Verbal and tactile cues throughout for technique. Rec bike - L4 x 5 min, L3 x 1 min (seat #3)  THERAPEUTIC ACTIVITIES: ROM/MMT Re-Assessment: see chart above  FGA: 22/30  TUG: 11.16 seconds  10MWT: 14.88 seconds   10/29/22 THERAPEUTIC EXERCISE: to improve flexibility, strength and mobility.  Verbal and tactile cues throughout for technique. Rec bike - L4 x 4 min, L3 x 2 min (seat #3) Standing YTB 4-way SLR x 10 each direction, B UE support on back of chair for balance - cues for neutral hip/LE alignment and to  minimize UB/UE lean into chair Standing hip hinge with pole along spine x 10, SBA of PT with min A for LOB x 1 R/L hip hike from SLS on 2" step 2 x 10, single UE support on counter - VC and TC along with manual facilitation required to initiate movement pattern  THERAPEUTIC ACTIVITIES: Reviewed education in proper posture and body mechanics for household chores such as cleaning bathroom to minimize strain on low back and neck.   NEUROMUSCULAR RE-EDUCATION: To improve balance, proprioception, coordination, and reduce fall risk. Tandem stance 2 x 20 sec bil   PATIENT EDUCATION:  Education details: progress with PT and ongoing PT POC Person educated: Patient Education method: Explanation Education comprehension: verbalized understanding  HOME EXERCISE PROGRAM: Access Code: [email or web address] URL: [email or web address] Date: 09/13/2022 Prepared by: Annie Paras 50  Exercises - Seated Heel Toe Raises  - 1 x daily - 7 x weekly - 2 sets - 10 reps - 3 sec hold - Standing Isometric Hip Abduction with Ball on Wall  - 1 x daily - 7 x weekly - 2 sets - 10 reps - 3 sec hold - Seated Transversus Abdominis Bracing  - 2 x daily - 7 x weekly - 2 sets - 10 reps - 3-5 sec hold - Seated Isometric Hip Abduction with Resistance  - 1 x daily - 3 x weekly - 2 sets - 10 reps - 3 sec hold - Seated March with Resistance  - 1 x daily - 3 x weekly - 2 sets - 10 reps - 3 sec hold - Seated Shoulder Row with Anchored Resistance  - 1 x daily - 3 x weekly - 2 sets - 10 reps - 3-5 hold hold - Seated Single Arm Shoulder Row with Anchored Resistance  - 1 x daily - 3 x weekly - 2 sets - 10 reps - 3 sec hold - Seated Anti-Rotation Press With Anchored Resistance  - 1 x daily - 3 x weekly - 2 sets - 10 reps - 3 sec hold   ASSESSMENT:  CLINICAL IMPRESSION:  Miray reports improving standing tolerance for everyday activities with better awareness of muscle control and balance, although the latter still seems to  fluctuate. Progressed dynamic functional strengthening with more unstable UE support to further challenge balance with CGA/SBA of PT and occasional min assist to help pt right herself and recover from LOB. Dynamic stepping activities incorporating slight cross-body rotation to continue core strengthening while continuing to challenge balance during dynamic tasks. Facilitated proprioceptive awareness with static stance and reaching activities on uneven surfaces. Taeya continues to require close guarding and occasional min assist during most standing activities to prevent/correct LOB but notes improving muscle activation awareness.  OBJECTIVE IMPAIRMENTS: Abnormal gait, decreased activity tolerance, decreased balance, decreased coordination, decreased endurance, decreased mobility, difficulty walking, decreased ROM, decreased strength, decreased safety awareness, impaired perceived functional ability, impaired flexibility, improper body mechanics, postural dysfunction, and  pain.   ACTIVITY LIMITATIONS: carrying, lifting, bending, sitting, standing, squatting, sleeping, stairs, transfers, bed mobility, bathing, toileting, dressing, reach over head, locomotion level, and caring for others  PARTICIPATION LIMITATIONS: meal prep, cleaning, laundry, interpersonal relationship, driving, shopping, community activity, yard work, and church  PERSONAL FACTORS: Age, Past/current experiences, Time since onset of injury/illness/exacerbation, and 3+ comorbidities: C5-7 ACDF 09/21/20; idiopathic progressive neuropathy; hypthyroidism; DDD; remote h/o back surgery at age 66; h/o scoliosis; Takotsubo cardiomyopathy 01/10/22   are also affecting patient's functional outcome.   REHAB POTENTIAL: Good  CLINICAL DECISION MAKING: Evolving/moderate complexity  EVALUATION COMPLEXITY: Moderate   GOALS: Goals reviewed with patient? Yes  SHORT TERM GOALS: Target date: 09/23/2022  Patient will be independent with initial HEP to  improve outcomes and carryover.  Baseline:  Goal status: MET  09/13/22  2.  Complete balance assessment and update LTGs as indicated.  Baseline:  Goal status: MET  08/28/22  LONG TERM GOALS: Target date: 10/21/2022, extended to 12/03/2022   Patient will be independent with ongoing/advanced HEP for self-management at home.  Baseline:  Goal status: IN PROGRESS  11/05/22 - met for current HEP  2.  Patient to demonstrate ability to achieve and maintain good spinal alignment/posturing and body mechanics needed for daily activities. Baseline:  Goal status: MET  10/08/22 - pt demonstrates good awareness of neutral spine alignment; 10/29/22 - refresher indicated in relation to household cleaning tasks  3.  Patient will demonstrate functional pain free lumbar ROM to perform ADLs.   Baseline:  Goal status: IN PROGRESS  11/05/22 - limited in all directions with standing balance impacting assessment  4.  Patient will demonstrate improved B proximal LE strength to >/= 4 to 4+/5 for improved stability and ease of mobility. Baseline: refer to MMT table above Goal status: IN PROGRESS  11/05/22 - knee strength improving, but continued hip weakness   5  Patient will demonstrate improved B ankle strength to >/= 3+ to 4-/5 for improved gait stability. Baseline: refer to MMT table above Goal status: IN PROGRESS  11/05/22   6.  Patient will ambulate with improved gait pattern with decreased B foot drop/slap and no evidence of instability/LOB. Baseline:  Goal status: IN PROGRESS  11/05/22 - Pt reports no foot slap ~2/3 of the time when walking, but it becomes more evident with fatigue. Gait remains somewhat ataxic.  7. Patient will report </= 32% on Modified Oswestry to demonstrate improved functional ability with decreased pain interference. Baseline: 22 / 50 = 44.0% (eval); 23 / 50 = 46.0% (10/15/22) Goal status: MET  11/05/22:11 / 50 = 22.0 %  8.  Patient will improve Berg score to >/= 36/56 to improve safety  and stability with ADLs in standing and reduce risk for falls. Baseline: 27/56 Goal status: MET and REVISED (see 8a.below) 10/08/22 - Berg = 36/56  8a.  Patient will improve Berg score to >/= 44/56 to improve safety and stability with ADLs in standing and reduce risk for falls. Baseline: 36/56 (10/08/22) Goal status: REVISED   9.  Patient will improve FGA score to >/= 19/30 to improve gait stability and reduce risk for falls. Baseline: 8/30 (08/28/22); 18/30 (10/15/22) Goal status: MET  and REVISED (see 9a below) 11/05/22 - FGA = 22/30    9a.  Pt will improve FGA score to >/= 24/30 to improve gait stability and reduce risk for falls. Baseline:  Goal status: REVISED 11/05/22     PLAN:  PT FREQUENCY: 2x/week  PT DURATION: 6-8 weeks  PLANNED  INTERVENTIONS: Therapeutic exercises, Therapeutic activity, Neuromuscular re-education, Balance training, Gait training, Patient/Family education, Self Care, Joint mobilization, DME instructions, Aquatic Therapy, Dry Needling, Electrical stimulation, Cryotherapy, Moist heat, scar mobilization, Taping, Ultrasound, Ionotophoresis '4mg'$ /ml Dexamethasone, Manual therapy, and Re-evaluation [id number] approved for E-stim, but [id number] (E-stim) not approved per [facility] prior authorization  PLAN FOR NEXT SESSION: Update HEP as indicated, continue core/lumbar strengthening; balance training and gait stability, SLS activities that focus on bringing foot onto different directions  Percival Spanish, PT 11/07/2022, 11:09 AM

## 2022-11-12 ENCOUNTER — Ambulatory Visit: Payer: Medicare PPO | Attending: Registered" | Admitting: Physical Therapy

## 2022-11-12 ENCOUNTER — Encounter: Payer: Self-pay | Admitting: Physical Therapy

## 2022-11-12 DIAGNOSIS — R2681 Unsteadiness on feet: Secondary | ICD-10-CM | POA: Insufficient documentation

## 2022-11-12 DIAGNOSIS — R2689 Other abnormalities of gait and mobility: Secondary | ICD-10-CM | POA: Diagnosis present

## 2022-11-12 DIAGNOSIS — M6281 Muscle weakness (generalized): Secondary | ICD-10-CM | POA: Diagnosis present

## 2022-11-12 DIAGNOSIS — M5416 Radiculopathy, lumbar region: Secondary | ICD-10-CM | POA: Insufficient documentation

## 2022-11-12 NOTE — Therapy (Signed)
OUTPATIENT PHYSICAL THERAPY TREATMENT    Patient Name: [name] Trujillo MRN: [id number] DOB:26-Dec-1940, 82 y.o., female Today's Date: 11/12/2022  END OF SESSION:  PT End of Session - 11/12/22 1100     Visit Number 18    Date for PT Re-Evaluation 12/03/22    Authorization Type [employer]    Authorization Time Period 08/27/23 - 12/03/22: Total of 22 visits approved through 12/03/22 as of 10/22/22    Authorization - Visit Number 18    Authorization - Number of Visits 22    Progress Note Due on Visit 20    PT Start Time 1100    PT Stop Time 1146    PT Time Calculation (min) 46 min    Activity Tolerance Patient tolerated treatment well    Behavior During Therapy WFL for tasks assessed/performed                    Past Medical History:  Diagnosis Date   High cholesterol    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   She had ACUTE TAKOTSUBO (STRESS) CARDIOMYOPATHY with elevated Troponin Levels - this would be considered "Demand Ischemia - Demand Infarction" & NOT associated with ACS/CAD.     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  - Cath with NO CAD. ==> RESOLVED: f/u Echo 03/2022: EF 60-65%.   Past Surgical History:  Procedure Laterality Date   APPEN[age]CTOMY     17-18 yo   BACK SURGERY     Age 48   CESAREAN SECTION     ECTOPIC PREGNANCY SURGERY     LAPAROSCOPIC HYSTERECTOMY     LEFT HEART CATH AND CORONARY ANGIOGRAPHY N/A 01/09/2022   Procedure: LEFT HEART CATH AND CORONARY ANGIOGRAPHY;  Surgeon: Sherren Mocha, MD;  Location: [facility];  Service: CV:: Widely patent coronaries with mild nonobstructive LAD plaquing.  Right dominant system.  Normal LVEDP.  Based on  clinical presentation, findings are consistent with acute Takotsubo Cardiomyopathy Syndrome   POSTERIOR FUSION LUMBAR SPINE  05/24/2022   ([place]; [name], MD): L2-L3 XLIF, L2-L3 POSTERIOR DECOMPRESSION AND FUSION   TRANSTHORACIC ECHOCARDIOGRAM  01/08/2022   Severely decreased LV function-EF 25 to 30%.  Mid to apical (mostly anterior) with normal basal motion.  GR 2 DD-elevated LAP.  Mildly dilated LA.  Aortic sclerosis with no stenosis.  No AI.  Normal MV with mild to moderate TR.  Mildly elevated RAP, and PAP (estimated 49 mg). If LAD CAD ruled out - consistent with Takotsubo CM Syndrome.   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    PCP: Derinda Late, MD   REFERRING PROVIDER: Dorena Dew, NP for Hermenia Fiscal, MD  REFERRING DIAG: Z98.1 (ICD-10-CM) - S/P lumbar spinal fusion   THERAPY DIAG:  Muscle weakness (generalized)  Radiculopathy, lumbar region  Other abnormalities of gait and mobility  Unsteadiness on feet  RATIONALE FOR EVALUATION AND TREATMENT: Rehabilitation  ONSET DATE: 05/24/2022 - L2-3 XLIF/PSF   NEXT MD VISIT: September 2024    SUBJECTIVE:                                                                                                                                                                                                         SUBJECTIVE STATEMENT:  Pt reports some soreness following her last visit but feels like  the activities are helping.   PAIN: Are you having pain? No  PERTINENT HISTORY:  L2-3 XLIF/PSF 05/24/22; C5-7 ACDF 09/21/20; idiopathic progressive neuropathy; hypthyroidism; DDD; remote h/o back surgery at age 47; h/o scoliosis; Takotsubo cardiomyopathy 01/10/22   PRECAUTIONS: Back - No BLT, TLSO discontinued as of 09/11/22  WEIGHT BEARING RESTRICTIONS: No  FALLS:  Has patient fallen in last 6 months? No  LIVING ENVIRONMENT: Lives with: lives with their spouse Lives in: House/apartment Stairs: Yes: External: 5-6 steps; on left going up Has following equipment at home: Walker - 2 wheeled, shower chair, and hiking/walking poles  OCCUPATION: Retired Pharmacist, hospital  PLOF: Independent and Leisure: walking daily for total of ~1.5 miles; working in the yard; sewing/quilting    PATIENT GOALS: "Be able to move and do things in the house and yard w/o worrying about falling."   OBJECTIVE:   DIAGNOSTIC FINDINGS:  08/27/22 - Lumbar spine x-ray completed as part of postop series - results pending   07/01/22 - Lumbar spine x-ray: Expected postoperative appearance related to L2-L3 fixation.   06/12/21 - Lumbar MRI: Postoperative and degenerative changes of the lumbar spine as described, similar compared to July 28, 2020. At L2-L3, there is moderate spinal canal stenosis due to disc bulging, endplate osteophytic spurring, facet and ligamentum flavum hypertrophy. Milder spinal canal stenosis is seen at L3-L4. There is multilevel neural foraminal stenosis.   PATIENT SURVEYS:  Modified Oswestry 22 / 50 = 44.0%, 23 / 50 = 46.0% (10/15/22)   SCREENING FOR RED FLAGS: Bowel or bladder incontinence: Yes: bladder - takes Vesicare Spinal tumors: No Cauda equina syndrome: No Compression fracture: No Abdominal aneurysm: No  COGNITION:  Overall cognitive status: Within functional limits for tasks assessed    SENSATION: WFL - Pt reports LE sensation has improved since surgery  MUSCLE  LENGTH: NT  POSTURE:  rounded shoulders, forward head, and right pelvic obliquity  PALPATION: NT  LUMBAR ROM: Deferred on eval pending MD clearance to release her from post-surgical precautions   Active  AROM  10/08/22 * AROM  11/05/22  Flexion Hands to mid shin Hands to mid shin   Extension 50% limited Limited 25%   Right lateral flexion Hand to lateral knee Hand to lateral knee  Left lateral flexion Hand to lateral knee Hand to lateral knee  Right rotation 50% limited Limited due to balance   Left rotation 50% limited WNL  (Blank rows = not tested)  (* - ROM assessment limited in standing due to balance deficits)  LOWER EXTREMITY ROM:    Grossly WFL  LOWER EXTREMITY MMT:  (tested in sitting on eval, traditional testing positions on 10/08/22)  MMT Right eval Left eval Right 10/08/22 Left 10/08/22 Right 11/05/22 Left  11/05/22  Hip flexion '4 4 4 4 4 4  '$ Hip extension 4- 4- 4- 3- 4+ 4+  Hip abduction 4+ 4+ 4+ 4 4+ 4+  Hip adduction 4+ 4+ 3+ 4 3+ 4  Hip internal rotation 4- 4- '4 4 4 4  '$ Hip external rotation 3+ 3+ 4- 3+ 4 4  Knee flexion 4+ 4+ '5 5 5 5  '$ Knee extension 4+ 4+ 4+ 4+ 4+ 4+  Ankle dorsiflexion 3- 3- 3 3- 3 3  Ankle plantarflexion 4- NWB 4- NWB 4+ NWB 4 NWB 4+ NWB 4+ NWB  Ankle inversion 3- 2- 3+ 2 4 2+  Ankle eversion 4- 3+ 4 3+ 4 4   (Blank rows = not tested)  LUMBAR SPECIAL TESTS:  NT  FUNCTIONAL TESTS:  5 times sit to stand: 22.19 sec; 22.87 sec (10/08/22) Timed up and go (TUG): 14.84 sec with hiking pole (08/28/22); 13.44 sec with hiking pole, 13.78 sec w/o AD (10/08/22), 11.16 seconds no hiking pole (11/05/22)  10 meter walk test: 15.47 sec with hiking pole; Gait speed = 2.12 ft/sec (08/28/22); 16.03 sec with hiking pole, 16.19 sec w/o AD; Gait speed = 2.05 ft/sec with hiking pole, 2.03 ft/sec w/o AD(10/08/22), 14.88 seconds no hiking pole (11/05/22)  Berg Balance Scale: 27/56; < 36 high risk for falls (close to 100%); 36/56 (10/08/22)  Functional gait  assessment: 8/30; < 19 = high risk fall (08/28/22), 18/30 (10/15/22), 22/30 no hiking pole (11/05/22) Left hip Trendelenburg evident in right SLS  GAIT: Distance walked: 60 Assistive device utilized:  single walking/hiking pole on R Level of assistance: SBA Gait pattern:  increased sway, step through pattern, decreased stride length, trendelenburg, and lateral hip instability Comments: L>R foot slap   TODAY'S TREATMENT:   11/11/22 THERAPEUTIC EXERCISE: to improve flexibility, strength and mobility.  Verbal and tactile cues throughout for technique.  Rec bike - L4 x 6 min (seat #2) B side-stepping along counter with looped RTB at ankles 4 x 10 ft Fwd/back monster walk along counter with looped RTB at ankles 4 x 10 ft  NEUROMUSCULAR RE-EDUCATION: To improve posture, balance, proprioception, coordination, and reduce fall risk.  Corner balance progression with narrow BOS on Airex pad with arms at sides and hovering over back of chair            Eyes open (SBA/supervision of PT): - static stance x 30 sec - horiz head turns 2 x 5 - vertical head nods 2 x 5 - trunk rotation 2 x 5 Eyes closed (SBA/CGA of PT): - static stance x 20 sec - horiz head turns x 5 - vertical head nods x 5 Corner balance progression with 3/4 tandem stance on  Airex pad with arms at sides and hands hovering over back of chair            Eyes open: - static stance x 30 sec - horiz head turns x 5 - vertical head nods x 5 Fwd wall falls from arms length to wall x 10 Wall bumps with narrow BOS on firm surface with arms wide:            - hip x 10 - shoulders x 10   11/07/22 THERAPEUTIC EXERCISE: to improve flexibility, strength and mobility.  Verbal and tactile cues throughout for technique. Rec bike - L4 x 6 min (seat #3) TRX squat x 10 Side step over 1/2 FR with TRX support x 10 - SBA/CGA of PT for balance  R/L lateral lunge with TRX support x 10 - SBA/CGA of PT for balance  R/L lunge ("genuflect") x 10, UE  support on counter & Airex pad under downward knee for cushioning if dropping too low to floor  NEUROMUSCULAR RE-EDUCATION: To improve posture, balance, proprioception, coordination, and reduce fall risk.  Alt fwd step with cross-body reach to cone atop 36" FR - CGA of PT for safety with occasional min A for LOB correction Standing on Airex pad: Multidirectional B reach to touch PT's hands - SBA with pt reaching occasionally to steady herself on PT's arms Multidirectional B reach for bean bags + toss to bucket - CGA via gait belt   11/05/22 THERAPEUTIC EXERCISE: to improve flexibility, strength and mobility.  Verbal and tactile cues throughout for technique. Rec bike - L4 x 5 min, L3 x 1 min (seat #3)  THERAPEUTIC ACTIVITIES: ROM/MMT Re-Assessment: see chart above  FGA: 22/30  TUG: 11.16 seconds  10MWT: 14.88 seconds   PATIENT EDUCATION:  Education details: progress with PT and ongoing PT POC Person educated: Patient Education method: Explanation Education comprehension: verbalized understanding  HOME EXERCISE PROGRAM: Access Code: [email or web address] URL: [email or web address] Date: 09/13/2022 Prepared by: Annie Paras 50  Exercises - Seated Heel Toe Raises  - 1 x daily - 7 x weekly - 2 sets - 10 reps - 3 sec hold - Standing Isometric Hip Abduction with Ball on Wall  - 1 x daily - 7 x weekly - 2 sets - 10 reps - 3 sec hold - Seated Transversus Abdominis Bracing  - 2 x daily - 7 x weekly - 2 sets - 10 reps - 3-5 sec hold - Seated Isometric Hip Abduction with Resistance  - 1 x daily - 3 x weekly - 2 sets - 10 reps - 3 sec hold - Seated March with Resistance  - 1 x daily - 3 x weekly - 2 sets - 10 reps - 3 sec hold - Seated Shoulder Row with Anchored Resistance  - 1 x daily - 3 x weekly - 2 sets - 10 reps - 3-5 hold hold - Seated Single Arm Shoulder Row with Anchored Resistance  - 1 x daily - 3 x weekly - 2 sets - 10 reps - 3 sec hold - Seated Anti-Rotation Press With  Anchored Resistance  - 1 x daily - 3 x weekly - 2 sets - 10 reps - 3 sec hold   ASSESSMENT:  CLINICAL IMPRESSION:  Aalani reports improving stability with standing for part of her shower and dressing but still has to sit at times due to her balance deficits, therefore therapeutic activities targeting static standing balance and balance reaction strategies while introducing varying BOS and support surfaces. Pt mostly  able to self-correct or use light bump into wall to stabilize during narrow BOS on Airex pad with EO but required much closer guarding during EC or when progressed to partial tandem stance. Facilitated hip and ankle strategies with wall falls and wall bumps with occasional assist for over-correction of balance. Progressed resistance with hip strengthening to RTB with pt noting increased fatigue. Saman will continue to benefit from skilled PT to address ongoing strength, proprioceptive and balance deficits to improve mobility and activity tolerance with decreased risk for falls.   OBJECTIVE IMPAIRMENTS: Abnormal gait, decreased activity tolerance, decreased balance, decreased coordination, decreased endurance, decreased mobility, difficulty walking, decreased ROM, decreased strength, decreased safety awareness, impaired perceived functional ability, impaired flexibility, improper body mechanics, postural dysfunction, and pain.   ACTIVITY LIMITATIONS: carrying, lifting, bending, sitting, standing, squatting, sleeping, stairs, transfers, bed mobility, bathing, toileting, dressing, reach over head, locomotion level, and caring for others  PARTICIPATION LIMITATIONS: meal prep, cleaning, laundry, interpersonal relationship, driving, shopping, community activity, yard work, and church  PERSONAL FACTORS: Age, Past/current experiences, Time since onset of injury/illness/exacerbation, and 3+ comorbidities: C5-7 ACDF 09/21/20; idiopathic progressive neuropathy; hypthyroidism; DDD; remote h/o back  surgery at age 33; h/o scoliosis; Takotsubo cardiomyopathy 01/10/22   are also affecting patient's functional outcome.   REHAB POTENTIAL: Good  CLINICAL DECISION MAKING: Evolving/moderate complexity  EVALUATION COMPLEXITY: Moderate   GOALS: Goals reviewed with patient? Yes  SHORT TERM GOALS: Target date: 09/23/2022  Patient will be independent with initial HEP to improve outcomes and carryover.  Baseline:  Goal status: MET  09/13/22  2.  Complete balance assessment and update LTGs as indicated.  Baseline:  Goal status: MET  08/28/22  LONG TERM GOALS: Target date: 10/21/2022, extended to 12/03/2022   Patient will be independent with ongoing/advanced HEP for self-management at home.  Baseline:  Goal status: IN PROGRESS  11/05/22 - met for current HEP  2.  Patient to demonstrate ability to achieve and maintain good spinal alignment/posturing and body mechanics needed for daily activities. Baseline:  Goal status: MET  10/08/22 - pt demonstrates good awareness of neutral spine alignment; 10/29/22 - refresher indicated in relation to household cleaning tasks  3.  Patient will demonstrate functional pain free lumbar ROM to perform ADLs.   Baseline:  Goal status: IN PROGRESS  11/05/22 - limited in all directions with standing balance impacting assessment  4.  Patient will demonstrate improved B proximal LE strength to >/= 4 to 4+/5 for improved stability and ease of mobility. Baseline: refer to MMT table above Goal status: IN PROGRESS  11/05/22 - knee strength improving, but continued hip weakness   5  Patient will demonstrate improved B ankle strength to >/= 3+ to 4-/5 for improved gait stability. Baseline: refer to MMT table above Goal status: IN PROGRESS  11/05/22   6.  Patient will ambulate with improved gait pattern with decreased B foot drop/slap and no evidence of instability/LOB. Baseline:  Goal status: IN PROGRESS  11/05/22 - Pt reports no foot slap ~2/3 of the time when walking, but  it becomes more evident with fatigue. Gait remains somewhat ataxic.  7. Patient will report </= 32% on Modified Oswestry to demonstrate improved functional ability with decreased pain interference. Baseline: 22 / 50 = 44.0% (eval); 23 / 50 = 46.0% (10/15/22) Goal status: MET  11/05/22:11 / 50 = 22.0 %  8.  Patient will improve Berg score to >/= 36/56 to improve safety and stability with ADLs in standing and reduce risk for falls. Baseline:  27/56 Goal status: MET and REVISED (see 8a.below) 10/08/22 - Berg = 36/56  8a.  Patient will improve Berg score to >/= 44/56 to improve safety and stability with ADLs in standing and reduce risk for falls. Baseline: 36/56 (10/08/22) Goal status: REVISED  10/08/22  9.  Patient will improve FGA score to >/= 19/30 to improve gait stability and reduce risk for falls. Baseline: 8/30 (08/28/22); 18/30 (10/15/22) Goal status: MET  and REVISED (see 9a below) 11/05/22 - FGA = 22/30    9a.  Pt will improve FGA score to >/= 24/30 to improve gait stability and reduce risk for falls. Baseline:  Goal status: REVISED  11/05/22     PLAN:  PT FREQUENCY: 2x/week  PT DURATION: 6-8 weeks  PLANNED INTERVENTIONS: Therapeutic exercises, Therapeutic activity, Neuromuscular re-education, Balance training, Gait training, Patient/Family education, Self Care, Joint mobilization, DME instructions, Aquatic Therapy, Dry Needling, Electrical stimulation, Cryotherapy, Moist heat, scar mobilization, Taping, Ultrasound, Ionotophoresis '4mg'$ /ml Dexamethasone, Manual therapy, and Re-evaluation - [phone] approved for E-stim, but [id number] (E-stim) not approved per [facility] prior authorization  PLAN FOR NEXT SESSION: Update HEP as indicated, continue core/lumbar strengthening; balance training and gait stability, SLS activities that focus on bringing foot onto different directions  Percival Spanish, PT 11/12/2022, 12:02 PM

## 2022-11-14 ENCOUNTER — Ambulatory Visit: Payer: Medicare PPO | Admitting: Physical Therapy

## 2022-11-14 ENCOUNTER — Encounter: Payer: Self-pay | Admitting: Physical Therapy

## 2022-11-14 DIAGNOSIS — R2689 Other abnormalities of gait and mobility: Secondary | ICD-10-CM

## 2022-11-14 DIAGNOSIS — R2681 Unsteadiness on feet: Secondary | ICD-10-CM

## 2022-11-14 DIAGNOSIS — M5416 Radiculopathy, lumbar region: Secondary | ICD-10-CM

## 2022-11-14 DIAGNOSIS — M6281 Muscle weakness (generalized): Secondary | ICD-10-CM | POA: Diagnosis not present

## 2022-11-14 NOTE — Therapy (Signed)
OUTPATIENT PHYSICAL THERAPY TREATMENT    Patient Name: [name] Trujillo MRN: [id number] DOB:10-02-1940, 82 y.o., female Today's Date: 11/14/2022  END OF SESSION:  PT End of Session - 11/14/22 1018     Visit Number 19    Date for PT Re-Evaluation 12/03/22    Authorization Type [employer]    Authorization Time Period 08/27/23 - 12/03/22: Total of 22 visits approved through 12/03/22 as of 10/22/22    Authorization - Visit Number 58    Authorization - Number of Visits 22    Progress Note Due on Visit 20    PT Start Time 1058    PT Stop Time 1145    PT Time Calculation (min) 47 min    Activity Tolerance Patient tolerated treatment well    Behavior During Therapy WFL for tasks assessed/performed                    Past Medical History:  Diagnosis Date   High cholesterol    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) 01/08/2022   DID NOT HAVE A NON-STEMI - which is an Acute Coronary Syndrome (ACS) Diagnosis.   She had ACUTE TAKOTSUBO (STRESS) CARDIOMYOPATHY with elevated Troponin Levels - this would be considered "Demand Ischemia - Demand Infarction" & NOT associated with ACS/CAD.     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  - Cath with NO CAD. ==> RESOLVED: f/u Echo 03/2022: EF 60-65%.   Past Surgical History:  Procedure Laterality Date   APPEN[age]CTOMY     17-18 yo   BACK SURGERY     Age 33   CESAREAN SECTION     ECTOPIC PREGNANCY SURGERY     LAPAROSCOPIC HYSTERECTOMY     LEFT HEART CATH AND CORONARY ANGIOGRAPHY N/A 01/09/2022   Procedure: LEFT HEART CATH AND CORONARY ANGIOGRAPHY;  Surgeon: Sherren Mocha, MD;  Location: [facility];  Service: CV:: Widely patent coronaries with mild nonobstructive LAD plaquing.  Right dominant system.  Normal LVEDP.  Based on  clinical presentation, findings are consistent with acute Takotsubo Cardiomyopathy Syndrome   POSTERIOR FUSION LUMBAR SPINE  05/24/2022   ([place]; [name], MD): L2-L3 XLIF, L2-L3 POSTERIOR DECOMPRESSION AND FUSION   TRANSTHORACIC ECHOCARDIOGRAM  01/08/2022   Severely decreased LV function-EF 25 to 30%.  Mid to apical (mostly anterior) with normal basal motion.  GR 2 DD-elevated LAP.  Mildly dilated LA.  Aortic sclerosis with no stenosis.  No AI.  Normal MV with mild to moderate TR.  Mildly elevated RAP, and PAP (estimated 49 mg). If LAD CAD ruled out - consistent with Takotsubo CM Syndrome.   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    PCP: Derinda Late, MD   REFERRING PROVIDER: Dorena Dew, NP for Hermenia Fiscal, MD  REFERRING DIAG: Z98.1 (ICD-10-CM) - S/P lumbar spinal fusion   THERAPY DIAG:  Muscle weakness (generalized)  Radiculopathy, lumbar region  Other abnormalities of gait and mobility  Unsteadiness on feet  RATIONALE FOR EVALUATION AND TREATMENT: Rehabilitation  ONSET DATE: 05/24/2022 - L2-3 XLIF/PSF   NEXT MD VISIT: September 2024    SUBJECTIVE:                                                                                                                                                                                                         SUBJECTIVE STATEMENT:  Pt reports she stood for 2 hrs making a blueberry pie for her son  yesterday.Marland Kitchen   PAIN: Are you having pain? No  PERTINENT HISTORY:  L2-3 XLIF/PSF 05/24/22; C5-7 ACDF 09/21/20; idiopathic progressive neuropathy; hypthyroidism; DDD; remote h/o back surgery at age 33; h/o scoliosis; Takotsubo cardiomyopathy 01/10/22   PRECAUTIONS: Back - No BLT, TLSO discontinued as of 09/11/22  WEIGHT BEARING RESTRICTIONS: No  FALLS:  Has patient fallen in last 6 months? No  LIVING ENVIRONMENT: Lives with: lives with their spouse Lives in: House/apartment Stairs: Yes: External: 5-6 steps; on left going up Has following equipment at home: Walker - 2 wheeled, shower chair, and hiking/walking poles  OCCUPATION: Retired Pharmacist, hospital  PLOF: Independent and Leisure: walking daily for total of ~1.5 miles; working in the yard; sewing/quilting    PATIENT GOALS: "Be able to move and do things in the house and yard w/o worrying about falling."   OBJECTIVE:   DIAGNOSTIC FINDINGS:  08/27/22 - Lumbar spine x-ray completed as part of postop series - results pending   07/01/22 - Lumbar spine x-ray: Expected postoperative appearance related to L2-L3 fixation.   06/12/21 - Lumbar MRI: Postoperative and degenerative changes of the lumbar spine as described, similar compared to July 28, 2020. At L2-L3, there is moderate spinal canal stenosis due to disc bulging, endplate osteophytic spurring, facet and ligamentum flavum hypertrophy. Milder spinal canal stenosis is seen at L3-L4. There is multilevel neural foraminal stenosis.   PATIENT SURVEYS:  Modified Oswestry 22 / 50 = 44.0%, 23 / 50 = 46.0% (10/15/22)   SCREENING FOR RED FLAGS: Bowel or bladder incontinence: Yes: bladder - takes Vesicare Spinal tumors: No Cauda equina syndrome: No Compression fracture: No Abdominal aneurysm: No  COGNITION:  Overall cognitive status: Within functional limits for tasks assessed    SENSATION: WFL - Pt reports LE sensation has improved since surgery  MUSCLE LENGTH: NT  POSTURE:  rounded  shoulders, forward head, and right pelvic obliquity  PALPATION: NT  LUMBAR ROM: Deferred on eval pending MD clearance to release her from post-surgical precautions   Active  AROM  10/08/22 * AROM  11/05/22  Flexion Hands to mid shin Hands to mid shin   Extension 50% limited Limited 25%   Right lateral flexion Hand to lateral knee Hand to lateral knee  Left lateral flexion Hand to lateral knee Hand to lateral knee  Right rotation 50% limited Limited due to balance   Left rotation 50% limited WNL  (Blank rows = not tested)  (* - ROM assessment limited in standing due to balance deficits)  LOWER EXTREMITY ROM:    Grossly WFL  LOWER EXTREMITY MMT:  (tested in sitting on eval, traditional testing positions on 10/08/22)  MMT Right eval Left eval Right 10/08/22 Left 10/08/22 Right 11/05/22 Left  11/05/22  Hip flexion '4 4 4 4 4 4  '$ Hip extension 4- 4- 4- 3- 4+ 4+  Hip abduction 4+ 4+ 4+ 4 4+ 4+  Hip adduction 4+ 4+ 3+ 4 3+ 4  Hip internal rotation 4- 4- '4 4 4 4  '$ Hip external rotation 3+ 3+ 4- 3+ 4 4  Knee flexion 4+ 4+ '5 5 5 5  '$ Knee extension 4+ 4+ 4+ 4+ 4+ 4+  Ankle dorsiflexion 3- 3- 3 3- 3 3  Ankle plantarflexion 4- NWB 4- NWB 4+ NWB 4 NWB 4+ NWB 4+ NWB  Ankle inversion 3- 2- 3+ 2 4 2+  Ankle eversion 4- 3+ 4 3+ 4 4   (Blank rows = not tested)  LUMBAR SPECIAL TESTS:  NT  FUNCTIONAL TESTS:  5 times sit to stand: 22.19 sec; 22.87 sec (10/08/22) Timed up and go (TUG): 14.84 sec with hiking pole (08/28/22); 13.44 sec with hiking pole, 13.78 sec w/o AD (10/08/22), 11.16 seconds no hiking pole (11/05/22)  10 meter walk test: 15.47 sec with hiking pole; Gait speed = 2.12 ft/sec (08/28/22); 16.03 sec with hiking pole, 16.19 sec w/o AD; Gait speed = 2.05 ft/sec with hiking pole, 2.03 ft/sec w/o AD(10/08/22), 14.88 seconds no hiking pole (11/05/22)  Berg Balance Scale: 27/56; < 36 high risk for falls (close to 100%); 36/56 (10/08/22)  Functional gait assessment: 8/30; < 19 = high risk fall  (08/28/22), 18/30 (10/15/22), 22/30 no hiking pole (11/05/22) Left hip Trendelenburg evident in right SLS  GAIT: Distance walked: 60 Assistive device utilized:  single walking/hiking pole on R Level of assistance: SBA Gait pattern:  increased sway, step through pattern, decreased stride length, trendelenburg, and lateral hip instability Comments: L>R foot slap   TODAY'S TREATMENT:   11/14/22 THERAPEUTIC EXERCISE: to improve flexibility, strength and mobility.  Verbal and tactile cues throughout for technique.  Rec bike - L4 x 6 min (seat #2) R/L SLS RDL reach 2 x 10 with single UE support on counter, 1st set horizontal reach, 2nd set to cone atop wooden box Functional squat position with B shoulder OH flexion 5 x 5", 2 sets (hips lightly touching bolster on mat table for assist with balance)   Sustained functional squat position with B shoulder flexion & extension x 10  NEUROMUSCULAR RE-EDUCATION: To improve posture, balance, proprioception, coordination, and reduce fall risk. Review of HEP level corner balance progression with narrow BOS (and verbal instruction in progression of foot placement as further challenge needed) on firm surface with arms at sides and hands hovering over back of chair placed in front as needed            Eyes open: - static stance x  30 sec - horiz head turns x 5 - vertical head nods x 5 - trunk rotation x 5 Eyes closed (w/ SBA of PT - not to be attempted at home except static stance): - static stance x 15-20 sec - horiz head turns x 5 - vertical head nods x 5 - trunk rotation x 5   THERAPEUTIC ACTIVITIES:  Walking through clinic scanning environment to find & pick up colored cones with SBA of PT   11/11/22 THERAPEUTIC EXERCISE: to improve flexibility, strength and mobility.  Verbal and tactile cues throughout for technique.  Rec bike - L4 x 6 min (seat #2) B side-stepping along counter with looped RTB at ankles 4 x 10 ft Fwd/back monster walk along counter  with looped RTB at ankles 4 x 10 ft  NEUROMUSCULAR RE-EDUCATION: To improve posture, balance, proprioception, coordination, and reduce fall risk.  Corner balance progression with narrow BOS on Airex pad with arms at sides and hovering over back of chair            Eyes open (SBA/supervision of PT): - static stance x 30 sec - horiz head turns 2 x 5 - vertical head nods 2 x 5 - trunk rotation 2 x 5 Eyes closed (SBA/CGA of PT): - static stance x 20 sec - horiz head turns x 5 - vertical head nods x 5 Corner balance progression with 3/4 tandem stance on Airex pad with arms at sides and hands hovering over back of chair            Eyes open: - static stance x 30 sec - horiz head turns x 5 - vertical head nods x 5 Fwd wall falls from arms length to wall x 10 Wall bumps with narrow BOS on firm surface with arms wide:            - hip x 10 - shoulders x 10   11/07/22 THERAPEUTIC EXERCISE: to improve flexibility, strength and mobility.  Verbal and tactile cues throughout for technique. Rec bike - L4 x 6 min (seat #3) TRX squat x 10 Side step over 1/2 FR with TRX support x 10 - SBA/CGA of PT for balance  R/L lateral lunge with TRX support x 10 - SBA/CGA of PT for balance  R/L lunge ("genuflect") x 10, UE support on counter & Airex pad under downward knee for cushioning if dropping too low to floor  NEUROMUSCULAR RE-EDUCATION: To improve posture, balance, proprioception, coordination, and reduce fall risk.  Alt fwd step with cross-body reach to cone atop 36" FR - CGA of PT for safety with occasional min A for LOB correction Standing on Airex pad: Multidirectional B reach to touch PT's hands - SBA with pt reaching occasionally to steady herself on PT's arms Multidirectional B reach for bean bags + toss to bucket - CGA via gait belt   PATIENT EDUCATION:  Education details: progress with PT and ongoing PT POC Person educated: Patient Education method: Explanation Education comprehension:  verbalized understanding  HOME EXERCISE PROGRAM: Access Code: [email or web address] URL: [email or web address] Date: 09/13/2022 Prepared by: Annie Paras 50  Exercises - Seated Heel Toe Raises  - 1 x daily - 7 x weekly - 2 sets - 10 reps - 3 sec hold - Standing Isometric Hip Abduction with Ball on Wall  - 1 x daily - 7 x weekly - 2 sets - 10 reps - 3 sec hold - Seated Transversus Abdominis Bracing  - 2 x daily - 7 x weekly - 2  sets - 10 reps - 3-5 sec hold - Seated Isometric Hip Abduction with Resistance  - 1 x daily - 3 x weekly - 2 sets - 10 reps - 3 sec hold - Seated March with Resistance  - 1 x daily - 3 x weekly - 2 sets - 10 reps - 3 sec hold - Seated Shoulder Row with Anchored Resistance  - 1 x daily - 3 x weekly - 2 sets - 10 reps - 3-5 hold hold - Seated Single Arm Shoulder Row with Anchored Resistance  - 1 x daily - 3 x weekly - 2 sets - 10 reps - 3 sec hold - Seated Anti-Rotation Press With Anchored Resistance  - 1 x daily - 3 x weekly - 2 sets - 10 reps - 3 sec hold   ASSESSMENT:  CLINICAL IMPRESSION:  [name] Trujillo reports improving standing tolerance allowing her to make a blueberry pie from scratch yesterday but continues to note instability when having to look around while walking (I.e. to check traffic). She requested review of standing corner balance progression, therefore guided her in progression of stance positions as well as progressive challenges to balance with eyes open but deferred eyes closed progression at home other than static stance. Remainder of session focusing on exercises and activities to improve stability with reaching and picking up objects as well as visual scanning while walking. She continues to require close supervision/SBA with many activities to ensure safety.   OBJECTIVE IMPAIRMENTS: Abnormal gait, decreased activity tolerance, decreased balance, decreased coordination, decreased endurance, decreased mobility, difficulty walking, decreased ROM,  decreased strength, decreased safety awareness, impaired perceived functional ability, impaired flexibility, improper body mechanics, postural dysfunction, and pain.   ACTIVITY LIMITATIONS: carrying, lifting, bending, sitting, standing, squatting, sleeping, stairs, transfers, bed mobility, bathing, toileting, dressing, reach over head, locomotion level, and caring for others  PARTICIPATION LIMITATIONS: meal prep, cleaning, laundry, interpersonal relationship, driving, shopping, community activity, yard work, and church  PERSONAL FACTORS: Age, Past/current experiences, Time since onset of injury/illness/exacerbation, and 3+ comorbidities: C5-7 ACDF 09/21/20; idiopathic progressive neuropathy; hypthyroidism; DDD; remote h/o back surgery at age 82; h/o scoliosis; Takotsubo cardiomyopathy 01/10/22   are also affecting patient's functional outcome.   REHAB POTENTIAL: Good  CLINICAL DECISION MAKING: Evolving/moderate complexity  EVALUATION COMPLEXITY: Moderate   GOALS: Goals reviewed with patient? Yes  SHORT TERM GOALS: Target date: 09/23/2022  Patient will be independent with initial HEP to improve outcomes and carryover.  Baseline:  Goal status: MET  09/13/22  2.  Complete balance assessment and update LTGs as indicated.  Baseline:  Goal status: MET  08/28/22  LONG TERM GOALS: Target date: 10/21/2022, extended to 12/03/2022   Patient will be independent with ongoing/advanced HEP for self-management at home.  Baseline:  Goal status: IN PROGRESS  11/05/22 - met for current HEP  2.  Patient to demonstrate ability to achieve and maintain good spinal alignment/posturing and body mechanics needed for daily activities. Baseline:  Goal status: MET  10/08/22 - pt demonstrates good awareness of neutral spine alignment; 10/29/22 - refresher indicated in relation to household cleaning tasks  3.  Patient will demonstrate functional pain free lumbar ROM to perform ADLs.   Baseline:  Goal status: IN  PROGRESS  11/05/22 - limited in all directions with standing balance impacting assessment  4.  Patient will demonstrate improved B proximal LE strength to >/= 4 to 4+/5 for improved stability and ease of mobility. Baseline: refer to MMT table above Goal status: IN PROGRESS  11/05/22 -  knee strength improving, but continued hip weakness   5  Patient will demonstrate improved B ankle strength to >/= 3+ to 4-/5 for improved gait stability. Baseline: refer to MMT table above Goal status: IN PROGRESS  11/05/22   6.  Patient will ambulate with improved gait pattern with decreased B foot drop/slap and no evidence of instability/LOB. Baseline:  Goal status: IN PROGRESS  11/05/22 - Pt reports no foot slap ~2/3 of the time when walking, but it becomes more evident with fatigue. Gait remains somewhat ataxic.  7. Patient will report </= 32% on Modified Oswestry to demonstrate improved functional ability with decreased pain interference. Baseline: 22 / 50 = 44.0% (eval); 23 / 50 = 46.0% (10/15/22) Goal status: MET  11/05/22:11 / 50 = 22.0 %  8.  Patient will improve Berg score to >/= 36/56 to improve safety and stability with ADLs in standing and reduce risk for falls. Baseline: 27/56 Goal status: MET and REVISED (see 8a.below) 10/08/22 - Berg = 36/56  8a.  Patient will improve Berg score to >/= 44/56 to improve safety and stability with ADLs in standing and reduce risk for falls. Baseline: 36/56 (10/08/22) Goal status: REVISED  10/08/22  9.  Patient will improve FGA score to >/= 19/30 to improve gait stability and reduce risk for falls. Baseline: 8/30 (08/28/22); 18/30 (10/15/22) Goal status: MET  and REVISED (see 9a below) 11/05/22 - FGA = 22/30    9a.  Pt will improve FGA score to >/= 24/30 to improve gait stability and reduce risk for falls. Baseline:  Goal status: REVISED  11/05/22     PLAN:  PT FREQUENCY: 2x/week  PT DURATION: 6-8 weeks  PLANNED INTERVENTIONS: Therapeutic exercises, Therapeutic  activity, Neuromuscular re-education, Balance training, Gait training, Patient/Family education, Self Care, Joint mobilization, DME instructions, Aquatic Therapy, Dry Needling, Electrical stimulation, Cryotherapy, Moist heat, scar mobilization, Taping, Ultrasound, Ionotophoresis '4mg'$ /ml Dexamethasone, Manual therapy, and Re-evaluation - [phone] approved for E-stim, but [id number] (E-stim) not approved per [facility] prior authorization  PLAN FOR NEXT SESSION: Re-assess [name]; continue core/lumbar and LE strengthening; update HEP as indicated, balance training and gait stability, SLS activities    Percival Spanish, PT 11/14/2022, 12:03 PM

## 2022-11-18 ENCOUNTER — Ambulatory Visit: Payer: Medicare PPO | Admitting: Physical Therapy

## 2022-11-18 ENCOUNTER — Encounter: Payer: Self-pay | Admitting: Physical Therapy

## 2022-11-18 DIAGNOSIS — M6281 Muscle weakness (generalized): Secondary | ICD-10-CM

## 2022-11-18 DIAGNOSIS — R2689 Other abnormalities of gait and mobility: Secondary | ICD-10-CM

## 2022-11-18 DIAGNOSIS — R2681 Unsteadiness on feet: Secondary | ICD-10-CM

## 2022-11-18 DIAGNOSIS — M5416 Radiculopathy, lumbar region: Secondary | ICD-10-CM

## 2022-11-18 NOTE — Therapy (Signed)
OUTPATIENT PHYSICAL THERAPY TREATMENT   Progress Note  Reporting Period 10/08/2022 to 11/18/2022   See note below for Objective Data and Assessment of Progress/Goals.      Patient Name: Brynlynn Ike MRN: [id number] DOB:1941/08/25, 82 y.o., female Today's Date: 11/18/2022  END OF SESSION:  PT End of Session - 11/18/22 1320     Visit Number 20    Date for PT Re-Evaluation 12/03/22    Authorization Type [employer]    Authorization Time Period 08/27/23 - 12/03/22: Total of 22 visits approved through 12/03/22 as of 10/22/22    Authorization - Visit Number 62    Authorization - Number of Visits 22    Progress Note Due on Visit 30    PT Start Time 1318    PT Stop Time 1404    PT Time Calculation (min) 46 min    Activity Tolerance Patient tolerated treatment well    Behavior During Therapy WFL for tasks assessed/performed                    Past Medical History:  Diagnosis Date   High cholesterol    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   She had ACUTE TAKOTSUBO (STRESS) CARDIOMYOPATHY with elevated Troponin Levels - this would be considered "Demand Ischemia - Demand Infarction" & NOT associated with ACS/CAD.     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  - Cath with NO CAD. ==> RESOLVED: f/u Echo 03/2022: EF 60-65%.   Past Surgical History:  Procedure Laterality Date   APPEN[age]CTOMY     17-18 yo   BACK SURGERY     Age 21   CESAREAN SECTION     ECTOPIC PREGNANCY SURGERY     LAPAROSCOPIC HYSTERECTOMY     LEFT HEART CATH AND CORONARY ANGIOGRAPHY N/A 01/09/2022   Procedure: LEFT HEART CATH AND CORONARY ANGIOGRAPHY;  Surgeon: Sherren Mocha, MD;  Location: [facility];  Service: CV:: Widely patent coronaries with mild nonobstructive LAD plaquing.  Right dominant system.  Normal LVEDP.  Based on clinical presentation, findings are consistent with acute Takotsubo Cardiomyopathy Syndrome   POSTERIOR FUSION LUMBAR SPINE  05/24/2022   ([place]; [name], MD): L2-L3 XLIF, L2-L3 POSTERIOR DECOMPRESSION AND FUSION   TRANSTHORACIC ECHOCARDIOGRAM  01/08/2022   Severely decreased LV function-EF 25 to 30%.  Mid to apical (mostly anterior) with normal basal motion.  GR 2 DD-elevated LAP.  Mildly dilated LA.  Aortic sclerosis with no stenosis.  No AI.  Normal MV with mild to moderate TR.  Mildly elevated RAP, and PAP (estimated 49 mg). If LAD CAD ruled out - consistent with Takotsubo CM Syndrome.   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   Myxomatous degeneration of  mitral valve 10/31/2015    PCP: Derinda Late, MD   REFERRING PROVIDER: Dorena Dew, NP for Hermenia Fiscal, MD   REFERRING DIAG: Z98.1 (ICD-10-CM) - S/P lumbar spinal fusion   THERAPY DIAG:  Muscle weakness (generalized)  Radiculopathy, lumbar region  Other abnormalities of gait and mobility  Unsteadiness on feet  RATIONALE FOR EVALUATION AND TREATMENT: Rehabilitation  ONSET DATE: 05/24/2022 - L2-3 XLIF/PSF   NEXT MD VISIT: September 2024    SUBJECTIVE:                                                                                                                                                                                                          SUBJECTIVE STATEMENT:  Pt reports awareness of improving stamina recently. Able to go to the grocery store and still go for a walk yesterday - previously the grocery store would wear her out.  PAIN: Are you having pain? No  PERTINENT HISTORY:  L2-3 XLIF/PSF 05/24/22; C5-7 ACDF 09/21/20; idiopathic progressive neuropathy; hypthyroidism; DDD; remote h/o back surgery at age 88; h/o scoliosis; Takotsubo cardiomyopathy 01/10/22   PRECAUTIONS: Back - No BLT, TLSO discontinued as of 09/11/22  WEIGHT BEARING RESTRICTIONS: No  FALLS:  Has patient fallen in last 6 months? No  LIVING ENVIRONMENT: Lives with: lives with their spouse Lives in: House/apartment Stairs: Yes: External: 5-6 steps; on left going up Has following equipment at home: Walker - 2 wheeled, shower chair, and hiking/walking poles  OCCUPATION: Retired Pharmacist, hospital  PLOF: Independent and Leisure: walking daily for total of ~1.5 miles; working in the yard; sewing/quilting    PATIENT GOALS: "Be able to move and do things in the house and yard w/o worrying about falling."   OBJECTIVE:   DIAGNOSTIC FINDINGS:  08/27/22 - Lumbar spine x-ray completed as part of postop series - results pending   07/01/22 - Lumbar spine x-ray: Expected postoperative appearance related to L2-L3 fixation.   06/12/21 - Lumbar MRI: Postoperative and degenerative changes of the lumbar spine as described, similar compared to July 28, 2020. At L2-L3, there is moderate spinal canal stenosis due to disc bulging, endplate osteophytic spurring, facet and ligamentum flavum hypertrophy. Milder spinal canal stenosis is seen at L3-L4. There is multilevel neural foraminal stenosis.   PATIENT SURVEYS:  Modified Oswestry 22 / 50 = 44.0%, 23 / 50 = 46.0% (10/15/22)   SCREENING FOR RED FLAGS: Bowel or bladder incontinence: Yes: bladder - takes Vesicare Spinal tumors: No Cauda equina syndrome: No Compression fracture:  No Abdominal aneurysm: No  COGNITION:  Overall cognitive status: Within  functional limits for tasks assessed    SENSATION: WFL - Pt reports LE sensation has improved since surgery  MUSCLE LENGTH: NT  POSTURE:  rounded shoulders, forward head, and right pelvic obliquity  PALPATION: NT  LUMBAR ROM: Deferred on eval pending MD clearance to release her from post-surgical precautions   Active  AROM  10/08/22 * AROM  11/05/22  Flexion Hands to mid shin Hands to mid shin   Extension 50% limited Limited 25%   Right lateral flexion Hand to lateral knee Hand to lateral knee  Left lateral flexion Hand to lateral knee Hand to lateral knee  Right rotation 50% limited Limited due to balance   Left rotation 50% limited WNL  (Blank rows = not tested)  (* - ROM assessment limited in standing due to balance deficits)  LOWER EXTREMITY ROM:    Grossly WFL  LOWER EXTREMITY MMT:  (tested in sitting on eval, traditional testing positions for all subsequent testing)  MMT Right  eval Left    eval Right 10/08/22 Left 10/08/22 Right 11/05/22 Left 11/05/22 Right 11/18/22 Left 11/18/22  Hip flexion '4 4 4 4 4 4 4 4  '$ Hip extension 4- 4- 4- 3- 4+ 4+ 4+ 4+  Hip abduction 4+ 4+ 4+ 4 4+ 4+ 4+ 4+  Hip adduction 4+ 4+ 3+ 4 3+ 4 4- 4  Hip internal rotation 4- 4- '4 4 4 4 '$ 4+ 4+  Hip external rotation 3+ 3+ 4- 3+ 4 4 4+ 4  Knee flexion 4+ 4+ '5 5 5 5 5 5  '$ Knee extension 4+ 4+ 4+ 4+ 4+ 4+ 5 5  Ankle dorsiflexion 3- 3- 3 3- 3 3 3+ 3  Ankle plantarflexion 4- NWB 4- NWB 4+ NWB 4 NWB 4+ NWB 4+ NWB 4+ NWB 4+ NWB  Ankle inversion 3- 2- 3+ 2 4 2+ 4 2+  Ankle eversion 4- 3+ 4 3+ '4 4 4 4   '$ (Blank rows = not tested)  LUMBAR SPECIAL TESTS:  NT  FUNCTIONAL TESTS:  5 times sit to stand: 22.19 sec; 22.87 sec (10/08/22) Timed up and go (TUG): 14.84 sec with hiking pole (08/28/22); 13.44 sec with hiking pole, 13.78 sec w/o AD (10/08/22), 11.16 seconds no hiking pole (11/05/22)  10 meter walk test: 15.47 sec with hiking pole;  Gait speed = 2.12 ft/sec (08/28/22); 16.03 sec with hiking pole, 16.19 sec w/o AD; Gait speed = 2.05 ft/sec with hiking pole, 2.03 ft/sec w/o AD(10/08/22), 14.88 seconds no hiking pole; Gait speed = 2.20 ft/sec (11/05/22)  Berg Balance Scale: 27/56; < 36 high risk for falls (close to 100%); 36/56 (10/08/22); 41/56 (11/18/22)  Functional gait assessment: 8/30; < 19 = high risk fall (08/28/22), 18/30 (10/15/22), 22/30 no hiking pole (11/05/22) Left hip Trendelenburg evident in right SLS  GAIT: Distance walked: 60 Assistive device utilized:  single walking/hiking pole on R Level of assistance: SBA Gait pattern:  increased sway, step through pattern, decreased stride length, trendelenburg, and lateral hip instability Comments: L>R foot slap   TODAY'S TREATMENT:   11/18/22 THERAPEUTIC EXERCISE: to improve flexibility, strength and mobility.  Verbal and tactile cues throughout for technique.  Rec bike - L5 x 6 min (seat #2) Standing march with looped YTB at midfeet 2 x 10, UE support on back of chair + cues for active DF as each foot lifts  THERAPEUTIC ACTIVITIES:  Berg: 41/56, 37-45 significant  risk for falls (>80%)  MMT Goal assessment   11/14/22 THERAPEUTIC EXERCISE: to improve flexibility, strength and mobility.  Verbal and tactile cues throughout for technique.  Rec bike - L4 x 6 min (seat #2) R/L SLS RDL reach 2 x 10 with single UE support on counter, 1st set horizontal reach, 2nd set to cone atop wooden box Functional squat position with B shoulder OH flexion 5 x 5", 2 sets (hips lightly touching bolster on mat table for assist with balance)   Sustained functional squat position with B shoulder flexion & extension x 10  NEUROMUSCULAR RE-EDUCATION: To improve posture, balance, proprioception, coordination, and reduce fall risk. Review of HEP level corner balance progression with narrow BOS (and verbal instruction in progression of foot placement as further challenge needed) on firm surface  with arms at sides and hands hovering over back of chair placed in front as needed            Eyes open: - static stance x 30 sec - horiz head turns x 5 - vertical head nods x 5 - trunk rotation x 5 Eyes closed (w/ SBA of PT - not to be attempted at home except static stance): - static stance x 15-20 sec - horiz head turns x 5 - vertical head nods x 5 - trunk rotation x 5   THERAPEUTIC ACTIVITIES:  Walking through clinic scanning environment to find & pick up colored cones with SBA of PT   11/11/22 THERAPEUTIC EXERCISE: to improve flexibility, strength and mobility.  Verbal and tactile cues throughout for technique.  Rec bike - L4 x 6 min (seat #2) B side-stepping along counter with looped RTB at ankles 4 x 10 ft Fwd/back monster walk along counter with looped RTB at ankles 4 x 10 ft  NEUROMUSCULAR RE-EDUCATION: To improve posture, balance, proprioception, coordination, and reduce fall risk.  Corner balance progression with narrow BOS on Airex pad with arms at sides and hovering over back of chair            Eyes open (SBA/supervision of PT): - static stance x 30 sec - horiz head turns 2 x 5 - vertical head nods 2 x 5 - trunk rotation 2 x 5 Eyes closed (SBA/CGA of PT): - static stance x 20 sec - horiz head turns x 5 - vertical head nods x 5 Corner balance progression with 3/4 tandem stance on Airex pad with arms at sides and hands hovering over back of chair            Eyes open: - static stance x 30 sec - horiz head turns x 5 - vertical head nods x 5 Fwd wall falls from arms length to wall x 10 Wall bumps with narrow BOS on firm surface with arms wide:            - hip x 10 - shoulders x 10   PATIENT EDUCATION:  Education details: progress with PT and ongoing PT POC Person educated: Patient Education method: Explanation Education comprehension: verbalized understanding  HOME EXERCISE PROGRAM: Access Code: [email or web address] URL: [email or web address] Date:  09/13/2022 Prepared by: Annie Paras  Exercises - Seated Heel Toe Raises  - 1 x daily - 7 x weekly - 2 sets - 10 reps - 3 sec hold - Standing Isometric Hip Abduction with Ball on Wall  - 1 x daily - 7 x weekly - 2 sets - 10 reps - 3 sec hold - Seated Transversus Abdominis Bracing  - 2 x daily - 7 x weekly - 2 sets - 10 reps - 3-5 sec hold - Seated Isometric Hip Abduction with  Resistance  - 1 x daily - 3 x weekly - 2 sets - 10 reps - 3 sec hold - Seated March with Resistance  - 1 x daily - 3 x weekly - 2 sets - 10 reps - 3 sec hold - Seated Shoulder Row with Anchored Resistance  - 1 x daily - 3 x weekly - 2 sets - 10 reps - 3-5 hold hold - Seated Single Arm Shoulder Row with Anchored Resistance  - 1 x daily - 3 x weekly - 2 sets - 10 reps - 3 sec hold - Seated Anti-Rotation Press With Anchored Resistance  - 1 x daily - 3 x weekly - 2 sets - 10 reps - 3 sec hold   ASSESSMENT:  CLINICAL IMPRESSION:  Sharryn reports awareness of improving stamina with activity as evidenced by her ability to go grocery shopping yesterday and still be able to walk for exercise.  She continues to demonstrate some ataxia with gait although with improving foot clearance, however she continues to note recurrence of foot slap with fatigue while walking approximately 30-40% of the time.  Continued gains noted with LE strength however weakness still evident proximally at hips and in bilateral ankles L>R.  Improved control with decreased loss of balance observed during sit to/from standing transitions.  Balance improving with Berg increased to 41/56 today, however still shy of goal of 44/56.  Similar gains observed with recent standardized balance testing with TUG and FGA. Overall Curtina continues to demonstrate progress towards her LTG's and remains motivated to continue to improve, therefore anticipate we will likely recommend recertification at end of current certification period.  OBJECTIVE IMPAIRMENTS: Abnormal gait,  decreased activity tolerance, decreased balance, decreased coordination, decreased endurance, decreased mobility, difficulty walking, decreased ROM, decreased strength, decreased safety awareness, impaired perceived functional ability, impaired flexibility, improper body mechanics, postural dysfunction, and pain.   ACTIVITY LIMITATIONS: carrying, lifting, bending, sitting, standing, squatting, sleeping, stairs, transfers, bed mobility, bathing, toileting, dressing, reach over head, locomotion level, and caring for others  PARTICIPATION LIMITATIONS: meal prep, cleaning, laundry, interpersonal relationship, driving, shopping, community activity, yard work, and church  PERSONAL FACTORS: Age, Past/current experiences, Time since onset of injury/illness/exacerbation, and 3+ comorbidities: C5-7 ACDF 09/21/20; idiopathic progressive neuropathy; hypthyroidism; DDD; remote h/o back surgery at age 9; h/o scoliosis; Takotsubo cardiomyopathy 01/10/22   are also affecting patient's functional outcome.   REHAB POTENTIAL: Good  CLINICAL DECISION MAKING: Evolving/moderate complexity  EVALUATION COMPLEXITY: Moderate   GOALS: Goals reviewed with patient? Yes  SHORT TERM GOALS: Target date: 09/23/2022  Patient will be independent with initial HEP to improve outcomes and carryover.  Baseline:  Goal status: MET  09/13/22  2.  Complete balance assessment and update LTGs as indicated.  Baseline:  Goal status: MET  08/28/22  LONG TERM GOALS: Target date: 10/21/2022, extended to 12/03/2022   Patient will be independent with ongoing/advanced HEP for self-management at home.  Baseline:  Goal status: IN PROGRESS  11/18/22 - met for current HEP  2.  Patient to demonstrate ability to achieve and maintain good spinal alignment/posturing and body mechanics needed for daily activities. Baseline:  Goal status: MET  10/08/22 - pt demonstrates good awareness of neutral spine alignment; 10/29/22 - refresher indicated in  relation to household cleaning tasks  3.  Patient will demonstrate functional pain free lumbar ROM to perform ADLs.   Baseline:  Goal status: IN PROGRESS  11/05/22 - limited in all directions with standing balance impacting assessment  4.  Patient will demonstrate  improved B proximal LE strength to >/= 4 to 4+/5 for improved stability and ease of mobility. Baseline: refer to MMT table above Goal status: IN PROGRESS  11/18/22 - knee strength 5/5, but continued hip weakness   5  Patient will demonstrate improved B ankle strength to >/= 3+ to 4-/5 for improved gait stability. Baseline: refer to MMT table above Goal status: IN PROGRESS  11/18/22   6.  Patient will ambulate with improved gait pattern with decreased B foot drop/slap and no evidence of instability/LOB. Baseline:  Goal status: IN PROGRESS  11/18/22 - Pt reports she still notes the L>R foot slap 30-40% of the time when walking, more evident with fatigue. Gait remains somewhat ataxic.  7. Patient will report </= 32% on Modified Oswestry to demonstrate improved functional ability with decreased pain interference. Baseline: 22 / 50 = 44.0% (eval); 23 / 50 = 46.0% (10/15/22) Goal status: MET  11/05/22:11 / 50 = 22.0 %  8.  Patient will improve Berg score to >/= 36/56 to improve safety and stability with ADLs in standing and reduce risk for falls. Baseline: 27/56 Goal status: MET and REVISED (see 8a.below) 10/08/22 - Berg = 36/56  8a.  Patient will improve Berg score to >/= 44/56 to improve safety and stability with ADLs in standing and reduce risk for falls. Baseline: 36/56 (10/08/22) Goal status: REVISED  11/18/22 - Berg = 41/56  9.  Patient will improve FGA score to >/= 19/30 to improve gait stability and reduce risk for falls. Baseline: 8/30 (08/28/22); 18/30 (10/15/22) Goal status: MET  and REVISED (see 9a below) 11/05/22 - FGA = 22/30    9a.  Pt will improve FGA score to >/= 24/30 to improve gait stability and reduce risk for  falls. Baseline:  Goal status: REVISED  11/05/22     PLAN:  PT FREQUENCY: 2x/week  PT DURATION: 6-8 weeks  PLANNED INTERVENTIONS: Therapeutic exercises, Therapeutic activity, Neuromuscular re-education, Balance training, Gait training, Patient/Family education, Self Care, Joint mobilization, DME instructions, Aquatic Therapy, Dry Needling, Electrical stimulation, Cryotherapy, Moist heat, scar mobilization, Taping, Ultrasound, Ionotophoresis '4mg'$ /ml Dexamethasone, Manual therapy, and Re-evaluation - [phone] approved for E-stim, but [id number] (E-stim) not approved per [facility] prior authorization  PLAN FOR NEXT SESSION: Re-assess 5xSTS; continue core/lumbar and LE strengthening; update HEP as indicated, balance training and gait stability, SLS activities    Percival Spanish, PT 11/18/2022, 6:36 PM

## 2022-11-19 ENCOUNTER — Encounter: Payer: Medicare PPO | Admitting: Physical Therapy

## 2022-11-20 ENCOUNTER — Encounter: Payer: Self-pay | Admitting: Physical Therapy

## 2022-11-20 ENCOUNTER — Ambulatory Visit: Payer: Medicare PPO | Admitting: Physical Therapy

## 2022-11-20 DIAGNOSIS — R2681 Unsteadiness on feet: Secondary | ICD-10-CM

## 2022-11-20 DIAGNOSIS — R2689 Other abnormalities of gait and mobility: Secondary | ICD-10-CM

## 2022-11-20 DIAGNOSIS — M6281 Muscle weakness (generalized): Secondary | ICD-10-CM

## 2022-11-20 DIAGNOSIS — M5416 Radiculopathy, lumbar region: Secondary | ICD-10-CM

## 2022-11-20 NOTE — Therapy (Signed)
OUTPATIENT PHYSICAL THERAPY TREATMENT      Patient Name: [name] Trujillo MRN: [id number] DOB:04-01-1941, 82 y.o., female Today's Date: 11/20/2022  END OF SESSION:  PT End of Session - 11/20/22 1147     Visit Number 21    Date for PT Re-Evaluation 12/03/22    Authorization Type [employer]    Authorization Time Period 08/27/23 - 12/03/22: Total of 22 visits approved through 12/03/22 as of 10/22/22    Authorization - Visit Number 21    Authorization - Number of Visits 22    Progress Note Due on Visit 30    PT Start Time 1147    PT Stop Time 1232    PT Time Calculation (min) 45 min    Activity Tolerance Patient tolerated treatment well    Behavior During Therapy [facility] for tasks assessed/performed                    Past Medical History:  Diagnosis Date   High cholesterol    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   She had ACUTE TAKOTSUBO (STRESS) CARDIOMYOPATHY with elevated Troponin Levels - this would be considered "Demand Ischemia - Demand Infarction" & NOT associated with ACS/CAD.     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  - Cath with NO CAD. ==> RESOLVED: f/u Echo 03/2022: EF 60-65%.   Past Surgical History:  Procedure Laterality Date   APPEN[age]CTOMY     17-18 yo   BACK SURGERY     Age 74   CESAREAN SECTION     ECTOPIC PREGNANCY SURGERY     LAPAROSCOPIC HYSTERECTOMY     LEFT HEART CATH AND CORONARY ANGIOGRAPHY N/A 01/09/2022   Procedure: LEFT HEART CATH AND CORONARY ANGIOGRAPHY;  Surgeon: Sherren Mocha, MD;  Location: [place] [facility];  Service: CV:: Widely patent coronaries with mild nonobstructive LAD plaquing.  Right dominant system.  Normal LVEDP.   Based on clinical presentation, findings are consistent with acute Takotsubo Cardiomyopathy Syndrome   POSTERIOR FUSION LUMBAR SPINE  05/24/2022   ([place]; [name], MD): L2-L3 XLIF, L2-L3 POSTERIOR DECOMPRESSION AND FUSION   TRANSTHORACIC ECHOCARDIOGRAM  01/08/2022   Severely decreased LV function-EF 25 to 30%.  Mid to apical (mostly anterior) with normal basal motion.  GR 2 DD-elevated LAP.  Mildly dilated LA.  Aortic sclerosis with no stenosis.  No AI.  Normal MV with mild to moderate TR.  Mildly elevated RAP, and PAP (estimated 49 mg). If LAD CAD ruled out - consistent with Takotsubo CM Syndrome.   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    PCP: Derinda Late, MD   REFERRING PROVIDER: Dorena Dew, NP for Hermenia Fiscal, MD  REFERRING DIAG: Z98.1 (ICD-10-CM) - S/P lumbar spinal fusion   THERAPY DIAG:  Muscle weakness (generalized)  Radiculopathy, lumbar region  Other abnormalities of gait and mobility  Unsteadiness on feet  RATIONALE FOR EVALUATION AND TREATMENT: Rehabilitation  ONSET DATE: 05/24/2022 - L2-3 XLIF/PSF   NEXT MD VISIT: September 2024    SUBJECTIVE:                                                                                                                                                                                                         SUBJECTIVE STATEMENT:  Pt reports her walking companion tells her that her feet  are not making as much noise as she walks (less foot slap).  PAIN: Are you having pain? No  PERTINENT HISTORY:  L2-3 XLIF/PSF 05/24/22; C5-7 ACDF 09/21/20; idiopathic progressive neuropathy; hypthyroidism; DDD; remote h/o back surgery at age 27; h/o scoliosis; Takotsubo cardiomyopathy 01/10/22   PRECAUTIONS: Back - No BLT, TLSO discontinued as of 09/11/22  WEIGHT BEARING RESTRICTIONS: No  FALLS:  Has patient fallen in last 6 months? No  LIVING ENVIRONMENT: Lives with: lives with their spouse Lives in: House/apartment Stairs: Yes: External: 5-6 steps; on left going up Has following equipment at home: Walker - 2 wheeled, shower chair, and hiking/walking poles  OCCUPATION: Retired Pharmacist, hospital  PLOF: Independent and Leisure: walking daily for total of ~1.5 miles; working in the yard; sewing/quilting    PATIENT GOALS: "Be able to move and do things in the house and yard w/o worrying about falling."   OBJECTIVE:   DIAGNOSTIC FINDINGS:  08/27/22 - Lumbar spine x-ray completed as part of postop series - results pending   07/01/22 - Lumbar spine x-ray: Expected postoperative appearance related to L2-L3 fixation.   06/12/21 - Lumbar MRI: Postoperative and degenerative changes of the lumbar spine as described, similar compared to July 28, 2020. At L2-L3, there is moderate spinal canal stenosis due to disc bulging, endplate osteophytic spurring, facet and ligamentum flavum hypertrophy. Milder spinal canal stenosis is seen at L3-L4. There is multilevel neural foraminal stenosis.   PATIENT SURVEYS:  Modified Oswestry 22 / 50 = 44.0%, 23 / 50 = 46.0% (10/15/22)   SCREENING FOR RED FLAGS: Bowel or bladder incontinence: Yes: bladder - takes Vesicare Spinal tumors: No Cauda equina syndrome: No Compression fracture: No Abdominal aneurysm: No  COGNITION:  Overall cognitive status: Within functional limits for tasks assessed    SENSATION: WFL - Pt reports LE sensation has improved since  surgery  MUSCLE LENGTH: NT  POSTURE:  rounded shoulders, forward head, and right  pelvic obliquity  PALPATION: NT  LUMBAR ROM: Deferred on eval pending MD clearance to release her from post-surgical precautions   Active  AROM  10/08/22 * AROM  11/05/22  Flexion Hands to mid shin Hands to mid shin   Extension 50% limited Limited 25%   Right lateral flexion Hand to lateral knee Hand to lateral knee  Left lateral flexion Hand to lateral knee Hand to lateral knee  Right rotation 50% limited Limited due to balance   Left rotation 50% limited WNL  (Blank rows = not tested)  (* - ROM assessment limited in standing due to balance deficits)  LOWER EXTREMITY ROM:    Grossly WFL  LOWER EXTREMITY MMT:  (tested in sitting on eval, traditional testing positions for all subsequent testing)  MMT Right  eval Left    eval Right 10/08/22 Left 10/08/22 Right 11/05/22 Left 11/05/22 Right 11/18/22 Left 11/18/22  Hip flexion '4 4 4 4 4 4 4 4  '$ Hip extension 4- 4- 4- 3- 4+ 4+ 4+ 4+  Hip abduction 4+ 4+ 4+ 4 4+ 4+ 4+ 4+  Hip adduction 4+ 4+ 3+ 4 3+ 4 4- 4  Hip internal rotation 4- 4- '4 4 4 4 '$ 4+ 4+  Hip external rotation 3+ 3+ 4- 3+ 4 4 4+ 4  Knee flexion 4+ 4+ '5 5 5 5 5 5  '$ Knee extension 4+ 4+ 4+ 4+ 4+ 4+ 5 5  Ankle dorsiflexion 3- 3- 3 3- 3 3 3+ 3  Ankle plantarflexion 4- NWB 4- NWB 4+ NWB 4 NWB 4+ NWB 4+ NWB 4+ NWB 4+ NWB  Ankle inversion 3- 2- 3+ 2 4 2+ 4 2+  Ankle eversion 4- 3+ 4 3+ '4 4 4 4   '$ (Blank rows = not tested)  LUMBAR SPECIAL TESTS:  NT  FUNCTIONAL TESTS:  5 times sit to stand: 22.19 sec (eval); 22.87 sec (10/08/22); 10.88 sec (11/20/22) Timed up and go (TUG): 14.84 sec with hiking pole (08/28/22); 13.44 sec with hiking pole, 13.78 sec w/o AD (10/08/22), 11.16 seconds no hiking pole (11/05/22)  10 meter walk test: 15.47 sec with hiking pole; Gait speed = 2.12 ft/sec (08/28/22); 16.03 sec with hiking pole, 16.19 sec w/o AD; Gait speed = 2.05 ft/sec with hiking pole, 2.03 ft/sec w/o  AD(10/08/22), 14.88 seconds no hiking pole; Gait speed = 2.20 ft/sec (11/05/22)  Berg Balance Scale: 27/56; < 36 high risk for falls (close to 100%); 36/56 (10/08/22); 41/56 (11/18/22)  Functional gait assessment: 8/30; < 19 = high risk fall (08/28/22), 18/30 (10/15/22), 22/30 no hiking pole (11/05/22) Left hip Trendelenburg evident in right SLS  GAIT: Distance walked: 60 Assistive device utilized:  single walking/hiking pole on R Level of assistance: SBA Gait pattern:  increased sway, step through pattern, decreased stride length, trendelenburg, and lateral hip instability Comments: L>R foot slap   TODAY'S TREATMENT:   11/20/22 THERAPEUTIC EXERCISE: to improve flexibility, strength and mobility.  Verbal and tactile cues throughout for technique.  Rec bike - L5 x 6 min (seat #2) Standing RTB 4-way SLR x 10 each, 2 pole A for balance with SBA of PT  THERAPEUTIC ACTIVITIES: 5xSTS:  10.88 sec Floor to/from stand transfer with CGA of PT to assess her ability to work in the yard/garden  NEUROMUSCULAR RE-EDUCATION: To improve posture, balance, proprioception, coordination, ankle stability/control, and reduce fall risk.  Heel walking along counter 4 x 10 ft Foam balance beam (SBA of PT with occasional CGA/min A for LOB correction) : Side stepping with  hands on counter x 4 passes Side stepping with heels centered on beam and hands on counter x 4 passes Side stepping with forefoot centered on beam and hands on counter x 4 passes Tandem gait fwd and back x 5 passes   11/18/22 THERAPEUTIC EXERCISE: to improve flexibility, strength and mobility.  Verbal and tactile cues throughout for technique.  Rec bike - L5 x 6 min (seat #2) Standing march with looped YTB at midfeet 2 x 10, UE support on back of chair + cues for active DF as each foot lifts  THERAPEUTIC ACTIVITIES:  Berg: 41/56, 37-45 significant  risk for falls (>80%)  MMT Goal assessment   11/14/22 THERAPEUTIC EXERCISE: to improve  flexibility, strength and mobility.  Verbal and tactile cues throughout for technique.  Rec bike - L4 x 6 min (seat #2) R/L SLS RDL reach 2 x 10 with single UE support on counter, 1st set horizontal reach, 2nd set to cone atop wooden box Functional squat position with B shoulder OH flexion 5 x 5", 2 sets (hips lightly touching bolster on mat table for assist with balance)   Sustained functional squat position with B shoulder flexion & extension x 10  NEUROMUSCULAR RE-EDUCATION: To improve posture, balance, proprioception, coordination, and reduce fall risk. Review of HEP level corner balance progression with narrow BOS (and verbal instruction in progression of foot placement as further challenge needed) on firm surface with arms at sides and hands hovering over back of chair placed in front as needed            Eyes open: - static stance x 30 sec - horiz head turns x 5 - vertical head nods x 5 - trunk rotation x 5 Eyes closed (w/ SBA of PT - not to be attempted at home except static stance): - static stance x 15-20 sec - horiz head turns x 5 - vertical head nods x 5 - trunk rotation x 5   THERAPEUTIC ACTIVITIES:  Walking through clinic scanning environment to find & pick up colored cones with SBA of PT   PATIENT EDUCATION:  Education details: progress with PT and ongoing PT POC Person educated: Patient Education method: Explanation Education comprehension: verbalized understanding  HOME EXERCISE PROGRAM: Access Code: [email or web address] URL: [email or web address] Date: 09/13/2022 Prepared by: Annie Paras  Exercises - Seated Heel Toe Raises  - 1 x daily - 7 x weekly - 2 sets - 10 reps - 3 sec hold - Standing Isometric Hip Abduction with Ball on Wall  - 1 x daily - 7 x weekly - 2 sets - 10 reps - 3 sec hold - Seated Transversus Abdominis Bracing  - 2 x daily - 7 x weekly - 2 sets - 10 reps - 3-5 sec hold - Seated Isometric Hip Abduction with Resistance  - 1 x daily - 3 x  weekly - 2 sets - 10 reps - 3 sec hold - Seated March with Resistance  - 1 x daily - 3 x weekly - 2 sets - 10 reps - 3 sec hold - Seated Shoulder Row with Anchored Resistance  - 1 x daily - 3 x weekly - 2 sets - 10 reps - 3-5 hold hold - Seated Single Arm Shoulder Row with Anchored Resistance  - 1 x daily - 3 x weekly - 2 sets - 10 reps - 3 sec hold - Seated Anti-Rotation Press With Anchored Resistance  - 1 x daily - 3 x weekly - 2 sets - 10 reps -  3 sec hold   ASSESSMENT:  CLINICAL IMPRESSION:  [name] Trujillo is able to complete sit to stand transfers with much better control and minimal to no need to step to correct her balance upon arising, nor using edge of chair seat for support/control, resulting in significant reduction in time for 5xSTS to 10.88 sec.  She reports her walking companion is noting less of foot her foot slap while walking for exercise, however she still feels limited with ankle control bilaterally L>R.  Incorporated ankle strengthening and control into balance activities/exercises today with patient noting significant fatigue by end of activity.  Continue to challenge SLS stability while progressing hip and core strengthening, with progression of resistance for 4-way SLR to RTB, again with fatigue noted by end of set.  Adrien continues to demonstrate consistently improving functional status and activity tolerance, but remains challenged by ongoing ankle and hip weakness as well as coordination/ataxia and balance deficits.  As such, anticipate she will benefit from recert on next visit and will request further insurance authorization from [facility].  OBJECTIVE IMPAIRMENTS: Abnormal gait, decreased activity tolerance, decreased balance, decreased coordination, decreased endurance, decreased mobility, difficulty walking, decreased ROM, decreased strength, decreased safety awareness, impaired perceived functional ability, impaired flexibility, improper body mechanics, postural dysfunction,  and pain.   ACTIVITY LIMITATIONS: carrying, lifting, bending, sitting, standing, squatting, sleeping, stairs, transfers, bed mobility, bathing, toileting, dressing, reach over head, locomotion level, and caring for others  PARTICIPATION LIMITATIONS: meal prep, cleaning, laundry, interpersonal relationship, driving, shopping, community activity, yard work, and church  PERSONAL FACTORS: Age, Past/current experiences, Time since onset of injury/illness/exacerbation, and 3+ comorbidities: C5-7 ACDF 09/21/20; idiopathic progressive neuropathy; hypthyroidism; DDD; remote h/o back surgery at age 84; h/o scoliosis; Takotsubo cardiomyopathy 01/10/22   are also affecting patient's functional outcome.   REHAB POTENTIAL: Good  CLINICAL DECISION MAKING: Evolving/moderate complexity  EVALUATION COMPLEXITY: Moderate   GOALS: Goals reviewed with patient? Yes  SHORT TERM GOALS: Target date: 09/23/2022  Patient will be independent with initial HEP to improve outcomes and carryover.  Baseline:  Goal status: MET  09/13/22  2.  Complete balance assessment and update LTGs as indicated.  Baseline:  Goal status: MET  08/28/22  LONG TERM GOALS: Target date: 10/21/2022, extended to 12/03/2022   Patient will be independent with ongoing/advanced HEP for self-management at home.  Baseline:  Goal status: IN PROGRESS  11/18/22 - met for current HEP  2.  Patient to demonstrate ability to achieve and maintain good spinal alignment/posturing and body mechanics needed for daily activities. Baseline:  Goal status: MET  10/08/22 - pt demonstrates good awareness of neutral spine alignment; 10/29/22 - refresher indicated in relation to household cleaning tasks  3.  Patient will demonstrate functional pain free lumbar ROM to perform ADLs.   Baseline:  Goal status: IN PROGRESS  11/05/22 - limited in all directions with standing balance impacting assessment  4.  Patient will demonstrate improved B proximal LE strength to >/= 4  to 4+/5 for improved stability and ease of mobility. Baseline: refer to MMT table above Goal status: IN PROGRESS  11/18/22 - knee strength 5/5, but continued hip weakness   5  Patient will demonstrate improved B ankle strength to >/= 3+ to 4-/5 for improved gait stability. Baseline: refer to MMT table above Goal status: IN PROGRESS  11/18/22   6.  Patient will ambulate with improved gait pattern with decreased B foot drop/slap and no evidence of instability/LOB. Baseline:  Goal status: IN PROGRESS  11/18/22 - Pt  reports she still notes the L>R foot slap 30-40% of the time when walking, more evident with fatigue. Gait remains somewhat ataxic.  7. Patient will report </= 32% on Modified Oswestry to demonstrate improved functional ability with decreased pain interference. Baseline: 22 / 50 = 44.0% (eval); 23 / 50 = 46.0% (10/15/22) Goal status: MET  11/05/22:11 / 50 = 22.0 %  8.  Patient will improve Berg score to >/= 36/56 to improve safety and stability with ADLs in standing and reduce risk for falls. Baseline: 27/56 Goal status: MET and REVISED (see 8a.below) 10/08/22 - Berg = 36/56  8a.  Patient will improve Berg score to >/= 44/56 to improve safety and stability with ADLs in standing and reduce risk for falls. Baseline: 36/56 (10/08/22) Goal status: IN PROGRESS  11/18/22 - Berg = 41/56  9.  Patient will improve FGA score to >/= 19/30 to improve gait stability and reduce risk for falls. Baseline: 8/30 (08/28/22); 18/30 (10/15/22) Goal status: MET  and REVISED (see 9a below) 11/05/22 - FGA = 22/30    9a.  Pt will improve FGA score to >/= 24/30 to improve gait stability and reduce risk for falls. Baseline:  Goal status: IN PROGRESS  11/05/22     PLAN:  PT FREQUENCY: 2x/week  PT DURATION: 6-8 weeks  PLANNED INTERVENTIONS: Therapeutic exercises, Therapeutic activity, Neuromuscular re-education, Balance training, Gait training, Patient/Family education, Self Care, Joint mobilization, DME  instructions, Aquatic Therapy, Dry Needling, Electrical stimulation, Cryotherapy, Moist heat, scar mobilization, Taping, Ultrasound, Ionotophoresis '4mg'$ /ml Dexamethasone, Manual therapy, and Re-evaluation - [phone] approved for E-stim, but [id number] (E-stim) not approved per [facility] prior authorization  PLAN FOR NEXT SESSION: [place] Medicare reauthorization - add floor transfer goal; continue core/lumbar and LE strengthening; update HEP as indicated, balance training and gait stability, SLS activities    Percival Spanish, PT 11/20/2022, 1:17 PM

## 2022-11-26 ENCOUNTER — Ambulatory Visit: Payer: Medicare PPO | Admitting: Physical Therapy

## 2022-11-26 ENCOUNTER — Encounter: Payer: Self-pay | Admitting: Physical Therapy

## 2022-11-26 ENCOUNTER — Encounter: Payer: Self-pay | Admitting: Nurse Practitioner

## 2022-11-26 DIAGNOSIS — M6281 Muscle weakness (generalized): Secondary | ICD-10-CM

## 2022-11-26 DIAGNOSIS — R2689 Other abnormalities of gait and mobility: Secondary | ICD-10-CM

## 2022-11-26 DIAGNOSIS — M5416 Radiculopathy, lumbar region: Secondary | ICD-10-CM

## 2022-11-26 DIAGNOSIS — R2681 Unsteadiness on feet: Secondary | ICD-10-CM

## 2022-11-26 NOTE — Therapy (Signed)
OUTPATIENT PHYSICAL THERAPY TREATMENT / RE-CERTIFICATION   Progress Note  Reporting Period 10/08/2022 to 11/26/2022   See note below for Objective Data and Assessment of Progress/Goals.      Patient Name: [name] Trujillo MRN: [id number] DOB:12-21-1940, 82 y.o., female Today's Date: 11/26/2022  END OF SESSION:  PT End of Session - 11/26/22 1102     Visit Number 22    Date for PT Re-Evaluation 01/07/23    Authorization Type [employer]    Authorization Time Period 08/27/23 - 12/03/22: Total of 22 visits approved through 12/03/22 as of 10/22/22    Authorization - Visit Number 6    Authorization - Number of Visits 22    Progress Note Due on Visit 30    PT Start Time 1102    PT Stop Time 1145    PT Time Calculation (min) 43 min    Activity Tolerance Patient tolerated treatment well    Behavior During Therapy WFL for tasks assessed/performed                    Past Medical History:  Diagnosis Date   High cholesterol    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   She had ACUTE TAKOTSUBO (STRESS) CARDIOMYOPATHY with elevated Troponin Levels - this would be considered "Demand Ischemia - Demand Infarction" & NOT associated with ACS/CAD.     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  - Cath with NO CAD. ==> RESOLVED: f/u Echo 03/2022: EF 60-65%.   Past Surgical History:  Procedure Laterality Date   APPENDECTOMY     17-18 yo   BACK SURGERY     Age 52   CESAREAN SECTION     ECTOPIC PREGNANCY SURGERY     LAPAROSCOPIC HYSTERECTOMY     LEFT HEART CATH AND CORONARY ANGIOGRAPHY N/A 01/09/2022   Procedure: LEFT HEART CATH AND CORONARY ANGIOGRAPHY;  Surgeon: Sherren Mocha, MD;   Location: [facility];  Service: CV:: Widely patent coronaries with mild nonobstructive LAD plaquing.  Right dominant system.  Normal LVEDP.  Based on clinical presentation, findings are consistent with acute Takotsubo Cardiomyopathy Syndrome   POSTERIOR FUSION LUMBAR SPINE  05/24/2022   ([place]; [name], MD): L2-L3 XLIF, L2-L3 POSTERIOR DECOMPRESSION AND FUSION   TRANSTHORACIC ECHOCARDIOGRAM  01/08/2022   Severely decreased LV function-EF 25 to 30%.  Mid to apical (mostly anterior) with normal basal motion.  GR 2 DD-elevated LAP.  Mildly dilated LA.  Aortic sclerosis with no stenosis.  No AI.  Normal MV with mild to moderate TR.  Mildly elevated RAP, and PAP (estimated 49 mg). If LAD CAD ruled out - consistent with Takotsubo CM Syndrome.   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   Ventricular premature beats 10/31/2015   Myxomatous  degeneration of mitral valve 10/31/2015    PCP: Derinda Late, MD   REFERRING PROVIDER: Dorena Dew, NP for Hermenia Fiscal, MD   REFERRING DIAG: Z98.1 (ICD-10-CM) - S/P lumbar spinal fusion   THERAPY DIAG:  Muscle weakness (generalized)  Radiculopathy, lumbar region  Other abnormalities of gait and mobility  Unsteadiness on feet  RATIONALE FOR EVALUATION AND TREATMENT: Rehabilitation  ONSET DATE: 05/24/2022 - L2-3 XLIF/PSF   NEXT MD VISIT: September 2024    SUBJECTIVE:                                                                                                                                                                                                          SUBJECTIVE STATEMENT:  Pt reports her balance does feel quite as good today, maybe as a result of sleep disturbance from DST change.  PAIN: Are you having pain? No  PERTINENT HISTORY:  L2-3 XLIF/PSF 05/24/22; C5-7 ACDF 09/21/20; idiopathic progressive neuropathy; hypthyroidism; DDD; remote h/o back surgery at age 53; h/o scoliosis; Takotsubo cardiomyopathy 01/10/22   PRECAUTIONS: Back - No BLT, TLSO discontinued as of 09/11/22  WEIGHT BEARING RESTRICTIONS: No  FALLS:  Has patient fallen in last 6 months? No  LIVING ENVIRONMENT: Lives with: lives with their spouse Lives in: House/apartment Stairs: Yes: External: 5-6 steps; on left going up Has following equipment at home: Walker - 2 wheeled, shower chair, and hiking/walking poles  OCCUPATION: Retired Pharmacist, hospital  PLOF: Independent and Leisure: walking daily for total of ~1.5 miles; working in the yard; sewing/quilting    PATIENT GOALS: "Be able to move and do things in the house and yard w/o worrying about falling."   OBJECTIVE:   DIAGNOSTIC FINDINGS:  08/27/22 - Lumbar spine x-ray completed as part of postop series - results pending   07/01/22 - Lumbar spine x-ray: Expected postoperative appearance related to L2-L3 fixation.   06/12/21 - Lumbar MRI: Postoperative and degenerative changes of the lumbar spine as described, similar compared to July 28, 2020. At L2-L3, there is moderate spinal canal stenosis due to disc bulging, endplate osteophytic spurring, facet and ligamentum flavum hypertrophy. Milder spinal canal stenosis is seen at L3-L4. There is multilevel neural foraminal stenosis.   PATIENT SURVEYS:  Modified Oswestry 22 / 50 = 44.0%, 23 / 50 = 46.0% (10/15/22)   SCREENING FOR RED FLAGS: Bowel or bladder incontinence: Yes: bladder - takes Vesicare Spinal tumors: No Cauda equina syndrome: No Compression fracture: No Abdominal aneurysm:  No  COGNITION:  Overall cognitive status: Within functional limits for tasks assessed  SENSATION: WFL - Pt reports LE sensation has improved since surgery  MUSCLE LENGTH: NT  POSTURE:  rounded shoulders, forward head, and right pelvic obliquity  PALPATION: NT  LUMBAR ROM: Deferred on eval pending MD clearance to release her from post-surgical precautions   Active  AROM  10/08/22 * AROM  11/05/22 AROM 11/26/22 *  Flexion Hands to mid shin Hands to mid shin  Hands to toes with slight knee flexion   Extension 50% limited Limited 25%  Limited 25%  Right lateral flexion Hand to lateral knee Hand to lateral knee Hand to just below knee  Left lateral flexion Hand to lateral knee Hand to lateral knee Hand to just below knee  Right rotation 50% limited Limited due to balance  25% limited  Left rotation 50% limited WNL WNL  (Blank rows = not tested)  (* - ROM assessment limited in standing due to balance deficits)  LOWER EXTREMITY ROM:    Grossly WFL  LOWER EXTREMITY MMT:  (tested in sitting on eval, traditional testing positions for all subsequent testing)  MMT Right  eval Left    eval Right 10/08/22 Left 10/08/22 Right 11/05/22 Left 11/05/22 Right 11/18/22 Left 11/18/22  Hip flexion 4 4 4 4 4 4 4 4   Hip extension 4- 4- 4- 3- 4+ 4+ 4+ 4+  Hip abduction 4+ 4+ 4+ 4 4+ 4+ 4+ 4+  Hip adduction 4+ 4+ 3+ 4 3+ 4 4- 4  Hip internal rotation 4- 4- 4 4 4 4  4+ 4+  Hip external rotation 3+ 3+ 4- 3+ 4 4 4+ 4  Knee flexion 4+ 4+ 5 5 5 5 5 5   Knee extension 4+ 4+ 4+ 4+ 4+ 4+ 5 5  Ankle dorsiflexion 3- 3- 3 3- 3 3 3+ 3  Ankle plantarflexion 4- NWB 4- NWB 4+ NWB 4 NWB 4+ NWB 4+ NWB 4+ NWB 4+ NWB  Ankle inversion 3- 2- 3+ 2 4 2+ 4 2+  Ankle eversion 4- 3+ 4 3+ 4 4 4 4    (Blank rows = not tested)  LUMBAR SPECIAL TESTS:  NT  FUNCTIONAL TESTS:  5 times sit to stand: 22.19 sec (eval); 22.87 sec (10/08/22); 10.88 sec (11/20/22) Timed up and go (TUG): 14.84 sec with hiking pole (08/28/22); 13.44 sec  with hiking pole, 13.78 sec w/o AD (10/08/22), 11.16 seconds no hiking pole (11/05/22)  10 meter walk test: 15.47 sec with hiking pole, Gait speed = 2.12 ft/sec (08/28/22); 16.03 sec with hiking pole, 16.19 sec w/o AD; Gait speed = 2.05 ft/sec with hiking pole, 2.03 ft/sec w/o AD(10/08/22), 14.88 seconds no hiking pole, Gait speed = 2.20 ft/sec (11/05/22); 16.16 sec w/o AD, Gait speed = 2.03 ft/sec (11/26/22)  Berg Balance Scale: 27/56; < 36 high risk for falls (close to 100%); 36/56 (10/08/22); 41/56 (11/18/22)  Functional gait assessment: 8/30; < 19 = high risk fall (08/28/22), 18/30 (10/15/22), 22/30 no hiking pole (11/05/22), 19/30 (11/26/22) Left hip Trendelenburg evident in right SLS  GAIT: Distance walked: 60 Assistive device utilized:  single walking/hiking pole on R Level of assistance: SBA Gait pattern:  increased sway, step through pattern, decreased stride length, trendelenburg, and lateral hip instability Comments: L>R foot slap   TODAY'S TREATMENT:   11/26/22 THERAPEUTIC EXERCISE: to improve flexibility, strength and mobility.  Verbal and tactile cues throughout for technique.  Rec bike - L5 x 6 min (seat #2)  THERAPEUTIC ACTIVITIES: Lumbar ROM assessment 10MWT: 16.16 sec Gait speed: 2.03 ft/sec FGA: 19/30 Floor to/from stand transfer  with CGA/min A of PT  Goal assessment   11/20/22 THERAPEUTIC EXERCISE: to improve flexibility, strength and mobility.  Verbal and tactile cues throughout for technique.  Rec bike - L5 x 6 min (seat #2) Standing RTB 4-way SLR x 10 each, 2 pole A for balance with SBA of PT  THERAPEUTIC ACTIVITIES: 5xSTS:  10.88 sec Floor to/from stand transfer with CGA of PT to assess her ability to work in the yard/garden  NEUROMUSCULAR RE-EDUCATION: To improve posture, balance, proprioception, coordination, ankle stability/control, and reduce fall risk.  Heel walking along counter 4 x 10 ft Foam balance beam (SBA of PT with occasional CGA/min A for LOB  correction) : Side stepping with hands on counter x 4 passes Side stepping with heels centered on beam and hands on counter x 4 passes Side stepping with forefoot centered on beam and hands on counter x 4 passes Tandem gait fwd and back x 5 passes   11/18/22 THERAPEUTIC EXERCISE: to improve flexibility, strength and mobility.  Verbal and tactile cues throughout for technique.  Rec bike - L5 x 6 min (seat #2) Standing march with looped YTB at midfeet 2 x 10, UE support on back of chair + cues for active DF as each foot lifts  THERAPEUTIC ACTIVITIES:  Berg: 41/56, 37-45 significant  risk for falls (>80%)  MMT Goal assessment   PATIENT EDUCATION:  Education details: progress with PT and ongoing PT POC Person educated: Patient Education method: Explanation Education comprehension: verbalized understanding  HOME EXERCISE PROGRAM: Access Code: [email or web address] URL: [email or web address] Date: 09/13/2022 Prepared by: Annie Paras  Exercises - Seated Heel Toe Raises  - 1 x daily - 7 x weekly - 2 sets - 10 reps - 3 sec hold - Standing Isometric Hip Abduction with Ball on Wall  - 1 x daily - 7 x weekly - 2 sets - 10 reps - 3 sec hold - Seated Transversus Abdominis Bracing  - 2 x daily - 7 x weekly - 2 sets - 10 reps - 3-5 sec hold - Seated Isometric Hip Abduction with Resistance  - 1 x daily - 3 x weekly - 2 sets - 10 reps - 3 sec hold - Seated March with Resistance  - 1 x daily - 3 x weekly - 2 sets - 10 reps - 3 sec hold - Seated Shoulder Row with Anchored Resistance  - 1 x daily - 3 x weekly - 2 sets - 10 reps - 3-5 hold hold - Seated Single Arm Shoulder Row with Anchored Resistance  - 1 x daily - 3 x weekly - 2 sets - 10 reps - 3 sec hold - Seated Anti-Rotation Press With Anchored Resistance  - 1 x daily - 3 x weekly - 2 sets - 10 reps - 3 sec hold   ASSESSMENT:  CLINICAL IMPRESSION:  Kimmarie reports feeling more unsteady today w/o known reason other than potentially due to  ongoing sleep disturbance from DST time switch. Ataxia more pronounced today with B foot slap also more prominent than in recent visits.  Increased instability resulting in slight decline from 22/30 to 19/30 with FGA reassessment today but still significantly improved from baseline of 8/30. Overall all recent standardized balance testing revealing continued improvement allowing for increased activity tolerance with decreasing LOB and reliance on AD. Similar gains also observed with strength per MMT, although fatigue remains a limiting factor with functional carryover of strength gains with remaining weakness most evident at ankle and hips. Ongoing balance and strength  deficits continue to limit her functionally with household activities and chores as well as with her desired recreational activities such as working in her garden, with one of her greatest difficulties being getting up/down from floor/ground. Overall, Lorene continues to demonstrate progress toward her LTGs with LTGs #2 & 7 met, and remaining goals partially me tor progressing. She remains very motivated to improve her functional mobility, balance and gait to allow her to safely resume her premorbid activity level w/o fear of falling. Syretta will continue to benefit from skilled PT to address ongoing ROM, strength and balance deficits to improve mobility and activity tolerance with decreased risk for falls, therefore will recommend recert for additional 2x/wk x 6 weeks.   OBJECTIVE IMPAIRMENTS: Abnormal gait, decreased activity tolerance, decreased balance, decreased coordination, decreased endurance, decreased mobility, difficulty walking, decreased ROM, decreased strength, decreased safety awareness, impaired perceived functional ability, impaired flexibility, improper body mechanics, postural dysfunction, and pain.   ACTIVITY LIMITATIONS: carrying, lifting, bending, sitting, standing, squatting, sleeping, stairs, transfers, bed mobility,  bathing, toileting, dressing, reach over head, locomotion level, and caring for others  PARTICIPATION LIMITATIONS: meal prep, cleaning, laundry, interpersonal relationship, driving, shopping, community activity, yard work, and church  PERSONAL FACTORS: Age, Past/current experiences, Time since onset of injury/illness/exacerbation, and 3+ comorbidities: C5-7 ACDF 09/21/20; idiopathic progressive neuropathy; hypthyroidism; DDD; remote h/o back surgery at age 36; h/o scoliosis; Takotsubo cardiomyopathy 01/10/22   are also affecting patient's functional outcome.   REHAB POTENTIAL: Good  CLINICAL DECISION MAKING: Evolving/moderate complexity  EVALUATION COMPLEXITY: Moderate   GOALS: Goals reviewed with patient? Yes  SHORT TERM GOALS: Target date: 09/23/2022  Patient will be independent with initial HEP to improve outcomes and carryover.  Baseline:  Goal status: MET  09/13/22  2.  Complete balance assessment and update LTGs as indicated.  Baseline:  Goal status: MET  08/28/22  LONG TERM GOALS: Target date: 10/21/2022, extended to 12/03/2022, extended to 01/07/2023   Patient will be independent with ongoing/advanced HEP for self-management at home.  Baseline:  Goal status: IN PROGRESS  11/26/22 - met for current HEP  2.  Patient to demonstrate ability to achieve and maintain good spinal alignment/posturing and body mechanics needed for daily activities. Baseline:  Goal status: MET  10/08/22 - pt demonstrates good awareness of neutral spine alignment; 10/29/22 - refresher indicated in relation to household cleaning tasks  3.  Patient will demonstrate functional pain free lumbar ROM to perform ADLs.   Baseline:  Goal status: PARTIALLY MET  11/26/22 - mostly WFL w/o pain but still mildly limited in extension and R rotation (balance also continues to limit functional ROM)  4.  Patient will demonstrate improved B proximal LE strength to >/= 4 to 4+/5 for improved stability and ease of  mobility. Baseline: refer to MMT table above Goal status: IN PROGRESS  11/18/22 - knee strength 5/5, but continued hip weakness esp with fatigue  5  Patient will demonstrate improved B ankle strength to >/= 3+ to 4-/5 for improved gait stability. Baseline: refer to MMT table above Goal status: PARTIALLY MET  11/18/22 - Met for R ankle, although limited endurance with R DF creating foot slap with fatigue, In progress for L ankle  6.  Patient will ambulate with improved gait pattern with decreased B foot drop/slap and no evidence of instability/LOB. Baseline:  Goal status: IN PROGRESS  11/26/22 - Foot slap more evident today and gait remains somewhat ataxic. Pt previously reporting she still notes the L>R foot slap 30-40% of the  time when walking, more evident with fatigue.   7. Patient will report </= 32% on Modified Oswestry to demonstrate improved functional ability with decreased pain interference. Baseline: 22 / 50 = 44.0% (eval); 23 / 50 = 46.0% (10/15/22) Goal status: MET  11/05/22:11 / 50 = 22.0 %  8.  Patient will improve Berg score to >/= 36/56 to improve safety and stability with ADLs in standing and reduce risk for falls. Baseline: 27/56 Goal status: MET and REVISED (see 8a.below) 10/08/22 - Berg = 36/56  8a.  Patient will improve Berg score to >/= 44/56 to improve safety and stability with ADLs in standing and reduce risk for falls. Baseline: 36/56 (10/08/22) Goal status: IN PROGRESS  11/18/22 - Berg = 41/56  9.  Patient will improve FGA score to >/= 19/30 to improve gait stability and reduce risk for falls. Baseline: 8/30 (08/28/22); 18/30 (10/15/22) Goal status: MET  and REVISED (see 9a below) 11/05/22 - FGA = 22/30    9a.  Patient will improve FGA score to >/= 24/30 to improve gait stability and reduce risk for falls. Baseline:  Goal status: IN PROGRESS  11/26/22 - 19/30  10.  Patient will be able to transition floor to/from stand w/o assistance or LOB to allow her complete chores  around her home and work in her garden. Baseline:  Goal status: INITIAL      PLAN:  PT FREQUENCY: 2x/week  PT DURATION: 6 weeks  PLANNED INTERVENTIONS: Therapeutic exercises, Therapeutic activity, Neuromuscular re-education, Balance training, Gait training, Patient/Family education, Self Care, Joint mobilization, DME instructions, Aquatic Therapy, Dry Needling, Electrical stimulation, Cryotherapy, Moist heat, scar mobilization, Taping, Ultrasound, Ionotophoresis 4mg /ml Dexamethasone, Manual therapy, and Re-evaluation [id number] approved for E-stim, but [id number] (E-stim) not approved per [facility] prior authorization  PLAN FOR NEXT SESSION: continue core/lumbar and LE strengthening, update HEP as indicated; balance training and gait stability, SLS activities; floor transfers   Percival Spanish, PT 11/26/2022, 12:25 PM

## 2022-11-28 ENCOUNTER — Encounter: Payer: Self-pay | Admitting: Physical Therapy

## 2022-11-28 ENCOUNTER — Ambulatory Visit: Payer: Medicare PPO | Admitting: Physical Therapy

## 2022-11-28 DIAGNOSIS — M6281 Muscle weakness (generalized): Secondary | ICD-10-CM | POA: Diagnosis not present

## 2022-11-28 DIAGNOSIS — R2689 Other abnormalities of gait and mobility: Secondary | ICD-10-CM

## 2022-11-28 DIAGNOSIS — R2681 Unsteadiness on feet: Secondary | ICD-10-CM

## 2022-11-28 DIAGNOSIS — M5416 Radiculopathy, lumbar region: Secondary | ICD-10-CM

## 2022-11-28 NOTE — Therapy (Signed)
OUTPATIENT PHYSICAL THERAPY TREATMENT     Patient Name: [name] Trujillo MRN: [id number] DOB:1941/01/01, 82 y.o., female Today's Date: 11/28/2022  END OF SESSION:  PT End of Session - 11/28/22 1103     Visit Number 23    Date for PT Re-Evaluation 01/07/23    Authorization Type [employer]    Authorization Time Period 08/27/23 - 01/07/23: 10 additional visits have been authorized as of 11/28/22, for a total of 34 until 01/07/23    Authorization - Visit Number 23    Authorization - Number of Visits 34    Progress Note Due on Visit 32   Recert/PN on visit [id number] (11/26/22)   PT Start Time 1103    PT Stop Time 1148    PT Time Calculation (min) 45 min    Activity Tolerance Patient tolerated treatment well    Behavior During Therapy [facility] for tasks assessed/performed                    Past Medical History:  Diagnosis Date   High cholesterol    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   She had ACUTE TAKOTSUBO (STRESS) CARDIOMYOPATHY with elevated Troponin Levels - this would be considered "Demand Ischemia - Demand Infarction" & NOT associated with ACS/CAD.     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  - Cath with NO CAD. ==> RESOLVED: f/u Echo 03/2022: EF 60-65%.   Past Surgical History:  Procedure Laterality Date   APPEN[age]CTOMY     17-18 yo   BACK SURGERY     Age 63   CESAREAN SECTION     ECTOPIC PREGNANCY SURGERY     LAPAROSCOPIC HYSTERECTOMY     LEFT HEART CATH AND CORONARY ANGIOGRAPHY N/A 01/09/2022   Procedure: LEFT HEART CATH AND CORONARY ANGIOGRAPHY;  Surgeon: Sherren Mocha, MD;  Location: [facility];  Service: CV:: Widely patent coronaries with mild  nonobstructive LAD plaquing.  Right dominant system.  Normal LVEDP.  Based on clinical presentation, findings are consistent with acute Takotsubo Cardiomyopathy Syndrome   POSTERIOR FUSION LUMBAR SPINE  05/24/2022   ([place]; [name], MD): L2-L3 XLIF, L2-L3 POSTERIOR DECOMPRESSION AND FUSION   TRANSTHORACIC ECHOCARDIOGRAM  01/08/2022   Severely decreased LV function-EF 25 to 30%.  Mid to apical (mostly anterior) with normal basal motion.  GR 2 DD-elevated LAP.  Mildly dilated LA.  Aortic sclerosis with no stenosis.  No AI.  Normal MV with mild to moderate TR.  Mildly elevated RAP, and PAP (estimated 49 mg). If LAD CAD ruled out - consistent with Takotsubo CM Syndrome.   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    PCP: Derinda Late, MD  REFERRING PROVIDER: Dorena Dew, NP for Hermenia Fiscal, MD   REFERRING DIAG: Z98.1 (ICD-10-CM) - S/P lumbar spinal fusion   THERAPY DIAG:  Muscle weakness (generalized)  Radiculopathy, lumbar region  Other abnormalities of gait and mobility  Unsteadiness on feet  RATIONALE FOR EVALUATION AND TREATMENT: Rehabilitation  ONSET DATE: 05/24/2022 - L2-3 XLIF/PSF   NEXT MD VISIT: September 2024    SUBJECTIVE:                                                                                                                                                                                                         SUBJECTIVE  STATEMENT:  Pt reports she is feeling more steady today - able to iron her clothes w/o "having to dance around to keep my balance".  PAIN: Are you having pain? No  PERTINENT HISTORY:  L2-3 XLIF/PSF 05/24/22; C5-7 ACDF 09/21/20; idiopathic progressive neuropathy; hypthyroidism; DDD; remote h/o back surgery at age 33; h/o scoliosis; Takotsubo cardiomyopathy 01/10/22   PRECAUTIONS: Back - No BLT, TLSO discontinued as of 09/11/22  WEIGHT BEARING RESTRICTIONS: No  FALLS:  Has patient fallen in last 6 months? No  LIVING ENVIRONMENT: Lives with: lives with their spouse Lives in: House/apartment Stairs: Yes: External: 5-6 steps; on left going up Has following equipment at home: Walker - 2 wheeled, shower chair, and hiking/walking poles  OCCUPATION: Retired Pharmacist, hospital  PLOF: Independent and Leisure: walking daily for total of ~1.5 miles; working in the yard; sewing/quilting    PATIENT GOALS: "Be able to move and do things in the house and yard w/o worrying about falling."   OBJECTIVE:   DIAGNOSTIC FINDINGS:  08/27/22 - Lumbar spine x-ray completed as part of postop series - results pending   07/01/22 - Lumbar spine x-ray: Expected postoperative appearance related to L2-L3 fixation.   06/12/21 - Lumbar MRI: Postoperative and degenerative changes of the lumbar spine as described, similar compared to July 28, 2020. At L2-L3, there is moderate spinal canal stenosis due to disc bulging, endplate osteophytic spurring, facet and ligamentum flavum hypertrophy. Milder spinal canal stenosis is seen at L3-L4. There is multilevel neural foraminal stenosis.   PATIENT SURVEYS:  Modified Oswestry 22 / 50 = 44.0%, 23 / 50 = 46.0% (10/15/22)   SCREENING FOR RED FLAGS: Bowel or bladder incontinence: Yes: bladder - takes Vesicare Spinal tumors: No Cauda equina syndrome: No Compression fracture: No Abdominal aneurysm: No  COGNITION:  Overall cognitive status: Within functional limits for tasks  assessed    SENSATION: WFL - Pt reports LE sensation has improved since surgery  MUSCLE LENGTH: NT  POSTURE:  rounded shoulders, forward head, and right pelvic obliquity  PALPATION: NT  LUMBAR ROM: Deferred on eval pending MD clearance to release her from post-surgical precautions   Active  AROM  10/08/22 * AROM  11/05/22 AROM 11/26/22 *  Flexion Hands to mid shin Hands to mid shin  Hands to toes with slight knee flexion   Extension 50% limited Limited 25%  Limited 25%  Right lateral flexion Hand to lateral knee Hand to lateral knee Hand to just below knee  Left lateral flexion Hand to lateral knee Hand to lateral knee Hand to just below knee  Right rotation 50% limited Limited due to balance  25% limited  Left rotation 50% limited WNL WNL  (Blank rows = not tested)  (* - ROM assessment limited in standing due to balance deficits)  LOWER EXTREMITY ROM:    Grossly WFL  LOWER EXTREMITY MMT:  (tested in sitting on eval, traditional testing positions for all subsequent testing)  MMT Right  eval Left    eval Right 10/08/22 Left 10/08/22 Right 11/05/22 Left 11/05/22 Right 11/18/22 Left 11/18/22  Hip flexion 4 4 4 4 4 4 4 4   Hip extension 4- 4- 4- 3- 4+ 4+ 4+ 4+  Hip abduction 4+ 4+ 4+ 4 4+ 4+ 4+ 4+  Hip adduction 4+ 4+ 3+ 4 3+ 4 4- 4  Hip internal rotation 4- 4- 4 4 4 4  4+ 4+  Hip external rotation 3+ 3+ 4- 3+ 4 4 4+ 4  Knee flexion 4+ 4+ 5 5 5 5 5 5   Knee extension 4+ 4+ 4+ 4+ 4+ 4+ 5 5  Ankle dorsiflexion 3- 3- 3 3- 3 3 3+ 3  Ankle plantarflexion 4- NWB 4- NWB 4+ NWB 4 NWB 4+ NWB 4+ NWB 4+ NWB 4+ NWB  Ankle inversion 3- 2- 3+ 2 4 2+ 4 2+  Ankle eversion 4- 3+ 4 3+ 4 4 4 4    (Blank rows = not tested)  LUMBAR SPECIAL TESTS:  NT  FUNCTIONAL TESTS:  5 times sit to stand: 22.19 sec (eval); 22.87 sec (10/08/22); 10.88 sec (11/20/22) Timed up and go (TUG): 14.84 sec with hiking pole (08/28/22); 13.44 sec with hiking pole, 13.78 sec w/o AD (10/08/22), 11.16 seconds no hiking pole  (11/05/22)  10 meter walk test: 15.47 sec with hiking pole, Gait speed = 2.12 ft/sec (08/28/22); 16.03 sec with hiking pole, 16.19 sec w/o AD; Gait speed = 2.05 ft/sec with hiking pole, 2.03 ft/sec w/o AD(10/08/22), 14.88 seconds no hiking pole, Gait speed = 2.20 ft/sec (11/05/22); 16.16 sec w/o AD, Gait speed = 2.03 ft/sec (11/26/22)  Berg Balance Scale: 27/56; < 36 high risk for falls (close to 100%); 36/56 (10/08/22); 41/56 (11/18/22)  Functional gait assessment: 8/30; < 19 = high risk fall (08/28/22), 18/30 (10/15/22), 22/30 no hiking pole (11/05/22), 19/30 (11/26/22) Left hip Trendelenburg evident in right SLS  GAIT: Distance walked: 60 Assistive device utilized:  single walking/hiking pole on R Level of assistance: SBA Gait pattern:  increased sway, step through pattern, decreased stride length, trendelenburg, and lateral hip instability Comments: L>R foot slap   TODAY'S TREATMENT:   11/28/22 THERAPEUTIC EXERCISE: to improve flexibility, strength and mobility.  Verbal and tactile cues throughout for technique.  Rec bike - L5 x 6 min (seat #2) Quadruped to 1/2 kneel (All's 4's PWR! Step) - alternating sides x 10 Tall knee hip hinge (towel roll under ankles) x 10 Negative heel raises from 4" step x 10  with UE support on counter at sink Negative toe raises from 4" step x 10 with UE support on counter at sink  NEUROMUSCULAR RE-EDUCATION: To improve posture, balance, proprioception, coordination, ankle stability/control, and reduce fall risk.  Heel walking along counter 4 x 10 ft Foam balance beam (SBA of PT): Side stepping with hands on counter x 4 passes Side stepping with heels centered on beam and hands on counter x 4 passes Side stepping with forefoot centered on beam and hands on counter x 4 passes Tandem gait fwd and back x 5 passes   11/26/22 THERAPEUTIC EXERCISE: to improve flexibility, strength and mobility.  Verbal and tactile cues throughout for technique.  Rec bike - L5 x 6 min  (seat #2)  THERAPEUTIC ACTIVITIES: Lumbar ROM assessment 10MWT: 16.16 sec Gait speed: 2.03 ft/sec FGA: 19/30 Floor to/from stand transfer with CGA/min A of PT  Goal assessment   11/20/22 THERAPEUTIC EXERCISE: to improve flexibility, strength and mobility.  Verbal and tactile cues throughout for technique.  Rec bike - L5 x 6 min (seat #2) Standing RTB 4-way SLR x 10 each, 2 pole A for balance with SBA of PT  THERAPEUTIC ACTIVITIES: 5xSTS:  10.88 sec Floor to/from stand transfer with CGA of PT to assess her ability to work in the yard/garden  NEUROMUSCULAR RE-EDUCATION: To improve posture, balance, proprioception, coordination, ankle stability/control, and reduce fall risk.  Heel walking along counter 4 x 10 ft Foam balance beam (SBA of PT with occasional CGA/min A for LOB correction) : Side stepping with hands on counter x 4 passes Side stepping with heels centered on beam and hands on counter x 4 passes Side stepping with forefoot centered on beam and hands on counter x 4 passes Tandem gait fwd and back x 5 passes   PATIENT EDUCATION:  Education details: progress with PT and ongoing PT POC Person educated: Patient Education method: Explanation Education comprehension: verbalized understanding  HOME EXERCISE PROGRAM: Access Code: [email or web address] URL: [email or web address] Date: 11/28/2022 Prepared by: Annie Paras  Exercises - Seated Heel Toe Raises  - 1 x daily - 7 x weekly - 2 sets - 10 reps - 3 sec hold - Standing Isometric Hip Abduction with Ball on Wall  - 1 x daily - 7 x weekly - 2 sets - 10 reps - 3 sec hold - Seated Transversus Abdominis Bracing  - 2 x daily - 7 x weekly - 2 sets - 10 reps - 3-5 sec hold - Seated Isometric Hip Abduction with Resistance  - 1 x daily - 3 x weekly - 2 sets - 10 reps - 3 sec hold - Seated March with Resistance  - 1 x daily - 3 x weekly - 2 sets - 10 reps - 3 sec hold - Seated Shoulder Row with Anchored Resistance  - 1 x daily -  3 x weekly - 2 sets - 10 reps - 3-5 hold hold - Seated Single Arm Shoulder Row with Anchored Resistance  - 1 x daily - 3 x weekly - 2 sets - 10 reps - 3 sec hold - Seated Anti-Rotation Press With Anchored Resistance  - 1 x daily - 3 x weekly - 2 sets - 10 reps - 3 sec hold - Tall Kneeling Hip Hinge  - 1 x daily - 3-4 x weekly - 2 sets - 10 reps - 3 sec hold  Quadruped PWR! Step  ASSESSMENT:  CLINICAL IMPRESSION:  Shailee reports better stability today than last visit, able to complete her  ironing w/o multiple steps to catch her balance. Therapeutic interventions targeting ongoing areas of deficits identified last visit, in particular floor to/from stand transfers and continued ankle strengthening and proprioceptive training to improve transitional movements and balance with normal daily tasks. Pt noting more limited control initially, improving with increased repetitions but still limited by fatigue. HEP updated to reflect exercise progression.  OBJECTIVE IMPAIRMENTS: Abnormal gait, decreased activity tolerance, decreased balance, decreased coordination, decreased endurance, decreased mobility, difficulty walking, decreased ROM, decreased strength, decreased safety awareness, impaired perceived functional ability, impaired flexibility, improper body mechanics, postural dysfunction, and pain.   ACTIVITY LIMITATIONS: carrying, lifting, bending, sitting, standing, squatting, sleeping, stairs, transfers, bed mobility, bathing, toileting, dressing, reach over head, locomotion level, and caring for others  PARTICIPATION LIMITATIONS: meal prep, cleaning, laundry, interpersonal relationship, driving, shopping, community activity, yard work, and church  PERSONAL FACTORS: Age, Past/current experiences, Time since onset of injury/illness/exacerbation, and 3+ comorbidities: C5-7 ACDF 09/21/20; idiopathic progressive neuropathy; hypthyroidism; DDD; remote h/o back surgery at age 56; h/o scoliosis; Takotsubo  cardiomyopathy 01/10/22   are also affecting patient's functional outcome.   REHAB POTENTIAL: Good  CLINICAL DECISION MAKING: Evolving/moderate complexity  EVALUATION COMPLEXITY: Moderate   GOALS: Goals reviewed with patient? Yes  SHORT TERM GOALS: Target date: 09/23/2022  Patient will be independent with initial HEP to improve outcomes and carryover.  Baseline:  Goal status: MET  09/13/22  2.  Complete balance assessment and update LTGs as indicated.  Baseline:  Goal status: MET  08/28/22  LONG TERM GOALS: Target date: 10/21/2022, extended to 12/03/2022, extended to 01/07/2023   Patient will be independent with ongoing/advanced HEP for self-management at home.  Baseline:  Goal status: IN PROGRESS  11/26/22 - met for current HEP  2.  Patient to demonstrate ability to achieve and maintain good spinal alignment/posturing and body mechanics needed for daily activities. Baseline:  Goal status: MET  10/08/22 - pt demonstrates good awareness of neutral spine alignment; 10/29/22 - refresher indicated in relation to household cleaning tasks  3.  Patient will demonstrate functional pain free lumbar ROM to perform ADLs.   Baseline:  Goal status: PARTIALLY MET  11/26/22 - mostly WFL w/o pain but still mildly limited in extension and R rotation (balance also continues to limit functional ROM)  4.  Patient will demonstrate improved B proximal LE strength to >/= 4 to 4+/5 for improved stability and ease of mobility. Baseline: refer to MMT table above Goal status: IN PROGRESS  11/18/22 - knee strength 5/5, but continued hip weakness esp with fatigue  5  Patient will demonstrate improved B ankle strength to >/= 3+ to 4-/5 for improved gait stability. Baseline: refer to MMT table above Goal status: PARTIALLY MET  11/18/22 - Met for R ankle, although limited endurance with R DF creating foot slap with fatigue, In progress for L ankle  6.  Patient will ambulate with improved gait pattern with decreased B  foot drop/slap and no evidence of instability/LOB. Baseline:  Goal status: IN PROGRESS  11/26/22 - Foot slap more evident today and gait remains somewhat ataxic. Pt previously reporting she still notes the L>R foot slap 30-40% of the time when walking, more evident with fatigue.   7. Patient will report </= 32% on Modified Oswestry to demonstrate improved functional ability with decreased pain interference. Baseline: 22 / 50 = 44.0% (eval); 23 / 50 = 46.0% (10/15/22) Goal status: MET  11/05/22:11 / 50 = 22.0 %  8.  Patient will improve Berg score to >/=  36/56 to improve safety and stability with ADLs in standing and reduce risk for falls. Baseline: 27/56 Goal status: MET and REVISED (see 8a.below) 10/08/22 - Berg = 36/56  8a.  Patient will improve Berg score to >/= 44/56 to improve safety and stability with ADLs in standing and reduce risk for falls. Baseline: 36/56 (10/08/22) Goal status: IN PROGRESS  11/18/22 - Berg = 41/56  9.  Patient will improve FGA score to >/= 19/30 to improve gait stability and reduce risk for falls. Baseline: 8/30 (08/28/22); 18/30 (10/15/22) Goal status: MET  and REVISED (see 9a below) 11/05/22 - FGA = 22/30    9a.  Patient will improve FGA score to >/= 24/30 to improve gait stability and reduce risk for falls. Baseline:  Goal status: IN PROGRESS  11/26/22 - 19/30  10.  Patient will be able to transition floor to/from stand w/o assistance or LOB to allow her complete chores around her home and work in her garden. Baseline:  Goal status: IN PROGRESS  11/28/22    PLAN:  PT FREQUENCY: 2x/week  PT DURATION: 6 weeks  PLANNED INTERVENTIONS: Therapeutic exercises, Therapeutic activity, Neuromuscular re-education, Balance training, Gait training, Patient/Family education, Self Care, Joint mobilization, DME instructions, Aquatic Therapy, Dry Needling, Electrical stimulation, Cryotherapy, Moist heat, scar mobilization, Taping, Ultrasound, Ionotophoresis 4mg /ml Dexamethasone,  Manual therapy, and Re-evaluation - [phone] approved for E-stim, but [id number] (E-stim) not approved per [facility] prior authorization  PLAN FOR NEXT SESSION: continue core/lumbar and LE strengthening, update HEP as indicated; balance training and gait stability, SLS activities; floor transfers   Percival Spanish, PT 11/28/2022, 11:58 AM

## 2022-12-03 ENCOUNTER — Encounter: Payer: Self-pay | Admitting: Physical Therapy

## 2022-12-03 ENCOUNTER — Ambulatory Visit: Payer: Medicare PPO | Admitting: Physical Therapy

## 2022-12-03 DIAGNOSIS — R2689 Other abnormalities of gait and mobility: Secondary | ICD-10-CM

## 2022-12-03 DIAGNOSIS — M6281 Muscle weakness (generalized): Secondary | ICD-10-CM | POA: Diagnosis not present

## 2022-12-03 DIAGNOSIS — M5416 Radiculopathy, lumbar region: Secondary | ICD-10-CM

## 2022-12-03 DIAGNOSIS — R2681 Unsteadiness on feet: Secondary | ICD-10-CM

## 2022-12-03 NOTE — Therapy (Signed)
OUTPATIENT PHYSICAL THERAPY TREATMENT     Patient Name: [name] Trujillo MRN: [id number] DOB:1941/07/09, 82 y.o., female Today's Date: 12/03/2022  END OF SESSION:  PT End of Session - 12/03/22 1101     Visit Number 24    Date for PT Re-Evaluation 01/07/23    Authorization Type [employer]    Authorization Time Period 08/27/23 - 01/07/23: 10 additional visits as of 11/28/22, for a total of 34 until 01/07/23    Authorization - Visit Number 24    Authorization - Number of Visits 34    Progress Note Due on Visit 32   Recert/PN on visit [id number] (11/26/22)   PT Start Time 1101    PT Stop Time 1137   Pt requesting to leave early   PT Time Calculation (min) 36 min    Activity Tolerance Patient tolerated treatment well    Behavior During Therapy [facility] for tasks assessed/performed                    Past Medical History:  Diagnosis Date   High cholesterol    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   She had ACUTE TAKOTSUBO (STRESS) CARDIOMYOPATHY with elevated Troponin Levels - this would be considered "Demand Ischemia - Demand Infarction" & NOT associated with ACS/CAD.     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  - Cath with NO CAD. ==> RESOLVED: f/u Echo 03/2022: EF 60-65%.   Past Surgical History:  Procedure Laterality Date   APPEN[age]CTOMY     17-18 yo   BACK SURGERY     Age 76   CESAREAN SECTION     ECTOPIC PREGNANCY SURGERY     LAPAROSCOPIC HYSTERECTOMY     LEFT HEART CATH AND CORONARY ANGIOGRAPHY N/A 01/09/2022   Procedure: LEFT HEART CATH AND CORONARY ANGIOGRAPHY;  Surgeon: Sherren Mocha, MD;  Location: [place] [facility];  Service: CV:: Widely patent coronaries with mild  nonobstructive LAD plaquing.  Right dominant system.  Normal LVEDP.  Based on clinical presentation, findings are consistent with acute Takotsubo Cardiomyopathy Syndrome   POSTERIOR FUSION LUMBAR SPINE  05/24/2022   ([place]; [name], MD): L2-L3 XLIF, L2-L3 POSTERIOR DECOMPRESSION AND FUSION   TRANSTHORACIC ECHOCARDIOGRAM  01/08/2022   Severely decreased LV function-EF 25 to 30%.  Mid to apical (mostly anterior) with normal basal motion.  GR 2 DD-elevated LAP.  Mildly dilated LA.  Aortic sclerosis with no stenosis.  No AI.  Normal MV with mild to moderate TR.  Mildly elevated RAP, and PAP (estimated 49 mg). If LAD CAD ruled out - consistent with Takotsubo CM Syndrome.   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    PCP: Sandi Mariscal,  Collier Salina, MD   REFERRING PROVIDER: Dorena Dew, NP for Hermenia Fiscal, MD   REFERRING DIAG: Z98.1 (ICD-10-CM) - S/P lumbar spinal fusion   THERAPY DIAG:  Muscle weakness (generalized)  Radiculopathy, lumbar region  Other abnormalities of gait and mobility  Unsteadiness on feet  RATIONALE FOR EVALUATION AND TREATMENT: Rehabilitation  ONSET DATE: 05/24/2022 - L2-3 XLIF/PSF   NEXT MD VISIT: September 2024    SUBJECTIVE:                                                                                                                                                                                                         SUBJECTIVE  STATEMENT:  Pt requesting clarification of latest HEP update.  PAIN: Are you having pain? No  PERTINENT HISTORY:  L2-3 XLIF/PSF 05/24/22; C5-7 ACDF 09/21/20; idiopathic progressive neuropathy; hypthyroidism; DDD; remote h/o back surgery at age 75; h/o scoliosis; Takotsubo cardiomyopathy 01/10/22   PRECAUTIONS: Back - No BLT, TLSO discontinued as of 09/11/22  WEIGHT BEARING RESTRICTIONS: No  FALLS:  Has patient fallen in last 6 months? No  LIVING ENVIRONMENT: Lives with: lives with their spouse Lives in: House/apartment Stairs: Yes: External: 5-6 steps; on left going up Has following equipment at home: Walker - 2 wheeled, shower chair, and hiking/walking poles  OCCUPATION: Retired Pharmacist, hospital  PLOF: Independent and Leisure: walking daily for total of ~1.5 miles; working in the yard; sewing/quilting    PATIENT GOALS: "Be able to move and do things in the house and yard w/o worrying about falling."   OBJECTIVE:   DIAGNOSTIC FINDINGS:  08/27/22 - Lumbar spine x-ray completed as part of postop series - results pending   07/01/22 - Lumbar spine x-ray: Expected postoperative appearance related to L2-L3 fixation.   06/12/21 - Lumbar MRI: Postoperative and degenerative changes of the lumbar spine as described, similar compared to July 28, 2020. At L2-L3, there is moderate spinal canal stenosis due to disc bulging, endplate osteophytic spurring, facet and ligamentum flavum hypertrophy. Milder spinal canal stenosis is seen at L3-L4. There is multilevel neural foraminal stenosis.   PATIENT SURVEYS:  Modified Oswestry 22 / 50 = 44.0%, 23 / 50 = 46.0% (10/15/22)   SCREENING FOR RED FLAGS: Bowel or bladder incontinence: Yes: bladder - takes Vesicare Spinal tumors: No Cauda equina syndrome: No Compression fracture: No Abdominal aneurysm: No  COGNITION:  Overall cognitive status: Within functional limits for tasks assessed    SENSATION: WFL - Pt reports LE sensation has improved since  surgery  MUSCLE LENGTH: NT  POSTURE:  rounded shoulders, forward head, and  right pelvic obliquity  PALPATION: NT  LUMBAR ROM: Deferred on eval pending MD clearance to release her from post-surgical precautions   Active  AROM  10/08/22 * AROM  11/05/22 AROM 11/26/22 *  Flexion Hands to mid shin Hands to mid shin  Hands to toes with slight knee flexion   Extension 50% limited Limited 25%  Limited 25%  Right lateral flexion Hand to lateral knee Hand to lateral knee Hand to just below knee  Left lateral flexion Hand to lateral knee Hand to lateral knee Hand to just below knee  Right rotation 50% limited Limited due to balance  25% limited  Left rotation 50% limited WNL WNL  (Blank rows = not tested)  (* - ROM assessment limited in standing due to balance deficits)  LOWER EXTREMITY ROM:    Grossly WFL  LOWER EXTREMITY MMT:  (tested in sitting on eval, traditional testing positions for all subsequent testing)  MMT Right  eval Left    eval Right 10/08/22 Left 10/08/22 Right 11/05/22 Left 11/05/22 Right 11/18/22 Left 11/18/22  Hip flexion 4 4 4 4 4 4 4 4   Hip extension 4- 4- 4- 3- 4+ 4+ 4+ 4+  Hip abduction 4+ 4+ 4+ 4 4+ 4+ 4+ 4+  Hip adduction 4+ 4+ 3+ 4 3+ 4 4- 4  Hip internal rotation 4- 4- 4 4 4 4  4+ 4+  Hip external rotation 3+ 3+ 4- 3+ 4 4 4+ 4  Knee flexion 4+ 4+ 5 5 5 5 5 5   Knee extension 4+ 4+ 4+ 4+ 4+ 4+ 5 5  Ankle dorsiflexion 3- 3- 3 3- 3 3 3+ 3  Ankle plantarflexion 4- NWB 4- NWB 4+ NWB 4 NWB 4+ NWB 4+ NWB 4+ NWB 4+ NWB  Ankle inversion 3- 2- 3+ 2 4 2+ 4 2+  Ankle eversion 4- 3+ 4 3+ 4 4 4 4    (Blank rows = not tested)  LUMBAR SPECIAL TESTS:  NT  FUNCTIONAL TESTS:  5 times sit to stand: 22.19 sec  Timed up and go (TUG): 14.84 sec with hiking pole (08/28/22) 10 meter walk test: 15.47 sec with hiking pole, Gait speed = 2.12 ft/sec (08/28/22) Berg Balance Scale: 27/56; < 36 high risk for falls (close to 100%) Functional gait assessment: 8/30; < 19 = high risk fall  (08/28/22) Left hip Trendelenburg evident in right SLS  10/08/22 5xSTS: 22.87 sec TUG: 13.44 sec with hiking pole, 13.78 sec w/o AD 10MWT: 16.03 sec with hiking pole, 16.19 sec w/o AD;  Gait speed = 2.05 ft/sec with hiking pole, 2.03 ft/sec w/o AD Berg: 36/56; < 36 high risk for falls (close to 100%)  10/15/22 FGA: 18/30; < 19 = high risk fall   11/05/22 TUG: 11.16 sec - no hiking pole 10MWT: 14.88 sec -  no hiking pole  Gait speed = 2.20 ft/sec FGA: 22/30 w/o hiking pole; 19-24 = medium risk fall   11/18/22 Berg: 41/56; 37-45 significant risk for falls (>80%)   11/20/22 5xSTS: 10.88 sec  11/26/22 10MWT: 16.16 sec w/o AD Gait speed: 2.03 ft/sec FGA: 19/30; 19-24 = medium risk fall   GAIT: Distance walked: 60 Assistive device utilized:  single walking/hiking pole on R Level of assistance: SBA Gait pattern:  increased sway, step through pattern, decreased stride length, trendelenburg, and lateral hip instability Comments: L>R foot slap   TODAY'S TREATMENT:   12/03/22 THERAPEUTIC EXERCISE: to improve flexibility, strength and mobility.  Verbal and tactile cues throughout for technique.  Rec bike -  L6 x 6 min (seat #2) Tall knee hip hinge (towel roll under ankles) 2 x 10 Quadruped to tall kneel (All's 4's PWR! Up) 2 x 10 Quadruped to 1/2 kneel (All's 4's PWR! Step) - LOB on 1st attempt causing her to have to sit down to floor - deferred further attempts Seated anterior tibialis stretch with foot under chair x 30" bil R/L lunge ("genuflect") x 10, UE support on counter & Airex pad under downward knee for cushioning if dropping too low to floor  Sustained functional squat + B shoulder flexion and extension 2 x 10   11/28/22 THERAPEUTIC EXERCISE: to improve flexibility, strength and mobility.  Verbal and tactile cues throughout for technique.  Rec bike - L5 x 6 min (seat #2) Quadruped to 1/2 kneel (All's 4's PWR! Step) - alternating sides x 10 Tall knee hip hinge (towel roll  under ankles) x 10 Negative heel raises from 4" step x 10 with UE support on counter at sink Negative toe raises from 4" step x 10 with UE support on counter at sink  NEUROMUSCULAR RE-EDUCATION: To improve posture, balance, proprioception, coordination, ankle stability/control, and reduce fall risk.  Heel walking along counter 4 x 10 ft Foam balance beam (SBA of PT): Side stepping with hands on counter x 4 passes Side stepping with heels centered on beam and hands on counter x 4 passes Side stepping with forefoot centered on beam and hands on counter x 4 passes Tandem gait fwd and back x 5 passes   11/26/22 THERAPEUTIC EXERCISE: to improve flexibility, strength and mobility.  Verbal and tactile cues throughout for technique.  Rec bike - L5 x 6 min (seat #2)  THERAPEUTIC ACTIVITIES: Lumbar ROM assessment 10MWT: 16.16 sec Gait speed: 2.03 ft/sec FGA: 19/30 Floor to/from stand transfer with CGA/min A of PT  Goal assessment   PATIENT EDUCATION:  Education details: progress with PT and ongoing PT POC Person educated: Patient Education method: Explanation Education comprehension: verbalized understanding  HOME EXERCISE PROGRAM: Access Code: [email or web address] URL: [email or web address] Date: 11/28/2022 Prepared by: Annie Paras  Exercises - Seated Heel Toe Raises  - 1 x daily - 7 x weekly - 2 sets - 10 reps - 3 sec hold - Standing Isometric Hip Abduction with Ball on Wall  - 1 x daily - 7 x weekly - 2 sets - 10 reps - 3 sec hold - Seated Transversus Abdominis Bracing  - 2 x daily - 7 x weekly - 2 sets - 10 reps - 3-5 sec hold - Seated Isometric Hip Abduction with Resistance  - 1 x daily - 3 x weekly - 2 sets - 10 reps - 3 sec hold - Seated March with Resistance  - 1 x daily - 3 x weekly - 2 sets - 10 reps - 3 sec hold - Seated Shoulder Row with Anchored Resistance  - 1 x daily - 3 x weekly - 2 sets - 10 reps - 3-5 hold hold - Seated Single Arm Shoulder Row with Anchored  Resistance  - 1 x daily - 3 x weekly - 2 sets - 10 reps - 3 sec hold - Seated Anti-Rotation Press With Anchored Resistance  - 1 x daily - 3 x weekly - 2 sets - 10 reps - 3 sec hold - Tall Kneeling Hip Hinge  - 1 x daily - 3-4 x weekly - 2 sets - 10 reps - 3 sec hold  Quadruped PWR! Up & Step  ASSESSMENT:  CLINICAL IMPRESSION:  [place]  requested review of exercises added to HEP last visit - clarified positioning and desired movement patterns with pt verbalizing good understanding, however pt experienced LOB while transitioning to upright 1/2 kneel during Quadruped PWR! Step on L causing her to have to sit to her R side on the yoga mat. She denies any injury and was able to transition from the floor to standing with UE support on chair w/o assistance from PT and continue with additional exercises targeting strengthening and balance for floor to stand transitions w/o complaint. Maythe will continue to benefit from skilled PT to address ongoing strength and balance deficits to improve mobility and activity tolerance with decreased risk for loss of balance. Session ended early at pt request when PT asked to consult on another patient, rather than finish session with another PT.  OBJECTIVE IMPAIRMENTS: Abnormal gait, decreased activity tolerance, decreased balance, decreased coordination, decreased endurance, decreased mobility, difficulty walking, decreased ROM, decreased strength, decreased safety awareness, impaired perceived functional ability, impaired flexibility, improper body mechanics, postural dysfunction, and pain.   ACTIVITY LIMITATIONS: carrying, lifting, bending, sitting, standing, squatting, sleeping, stairs, transfers, bed mobility, bathing, toileting, dressing, reach over head, locomotion level, and caring for others  PARTICIPATION LIMITATIONS: meal prep, cleaning, laundry, interpersonal relationship, driving, shopping, community activity, yard work, and church  PERSONAL FACTORS: Age,  Past/current experiences, Time since onset of injury/illness/exacerbation, and 3+ comorbidities: C5-7 ACDF 09/21/20; idiopathic progressive neuropathy; hypthyroidism; DDD; remote h/o back surgery at age 79; h/o scoliosis; Takotsubo cardiomyopathy 01/10/22   are also affecting patient's functional outcome.   REHAB POTENTIAL: Good  CLINICAL DECISION MAKING: Evolving/moderate complexity  EVALUATION COMPLEXITY: Moderate   GOALS: Goals reviewed with patient? Yes  SHORT TERM GOALS: Target date: 09/23/2022  Patient will be independent with initial HEP to improve outcomes and carryover.  Baseline:  Goal status: MET  09/13/22  2.  Complete balance assessment and update LTGs as indicated.  Baseline:  Goal status: MET  08/28/22  LONG TERM GOALS: Target date: 10/21/2022, extended to 12/03/2022, extended to 01/07/2023   Patient will be independent with ongoing/advanced HEP for self-management at home.  Baseline:  Goal status: IN PROGRESS  11/26/22 - met for current HEP  2.  Patient to demonstrate ability to achieve and maintain good spinal alignment/posturing and body mechanics needed for daily activities. Baseline:  Goal status: MET  10/08/22 - pt demonstrates good awareness of neutral spine alignment; 10/29/22 - refresher indicated in relation to household cleaning tasks  3.  Patient will demonstrate functional pain free lumbar ROM to perform ADLs.   Baseline:  Goal status: PARTIALLY MET  11/26/22 - mostly WFL w/o pain but still mildly limited in extension and R rotation (balance also continues to limit functional ROM)  4.  Patient will demonstrate improved B proximal LE strength to >/= 4 to 4+/5 for improved stability and ease of mobility. Baseline: refer to MMT table above Goal status: IN PROGRESS  11/18/22 - knee strength 5/5, but continued hip weakness esp with fatigue  5  Patient will demonstrate improved B ankle strength to >/= 3+ to 4-/5 for improved gait stability. Baseline: refer to MMT  table above Goal status: PARTIALLY MET  11/18/22 - Met for R ankle, although limited endurance with R DF creating foot slap with fatigue, In progress for L ankle  6.  Patient will ambulate with improved gait pattern with decreased B foot drop/slap and no evidence of instability/LOB. Baseline:  Goal status: IN PROGRESS  11/26/22 - Foot slap more evident today and  gait remains somewhat ataxic. Pt previously reporting she still notes the L>R foot slap 30-40% of the time when walking, more evident with fatigue.   7. Patient will report </= 32% on Modified Oswestry to demonstrate improved functional ability with decreased pain interference. Baseline: 22 / 50 = 44.0% (eval); 23 / 50 = 46.0% (10/15/22) Goal status: MET  11/05/22:11 / 50 = 22.0 %  8.  Patient will improve Berg score to >/= 36/56 to improve safety and stability with ADLs in standing and reduce risk for falls. Baseline: 27/56 Goal status: MET and REVISED (see 8a.below) 10/08/22 - Berg = 36/56  8a.  Patient will improve Berg score to >/= 44/56 to improve safety and stability with ADLs in standing and reduce risk for falls. Baseline: 36/56 (10/08/22) Goal status: IN PROGRESS  11/18/22 - Berg = 41/56  9.  Patient will improve FGA score to >/= 19/30 to improve gait stability and reduce risk for falls. Baseline: 8/30 (08/28/22); 18/30 (10/15/22) Goal status: MET  and REVISED (see 9a below) 11/05/22 - FGA = 22/30    9a.  Patient will improve FGA score to >/= 24/30 to improve gait stability and reduce risk for falls. Baseline:  Goal status: IN PROGRESS  11/26/22 - 19/30  10.  Patient will be able to transition floor to/from stand w/o assistance or LOB to allow her complete chores around her home and work in her garden. Baseline:  Goal status: IN PROGRESS  11/28/22    PLAN:  PT FREQUENCY: 2x/week  PT DURATION: 6 weeks  PLANNED INTERVENTIONS: Therapeutic exercises, Therapeutic activity, Neuromuscular re-education, Balance training, Gait  training, Patient/Family education, Self Care, Joint mobilization, DME instructions, Aquatic Therapy, Dry Needling, Electrical stimulation, Cryotherapy, Moist heat, scar mobilization, Taping, Ultrasound, Ionotophoresis 4mg /ml Dexamethasone, Manual therapy, and Re-evaluation - [phone] approved for E-stim, but [id number] (E-stim) not approved per [facility] prior authorization  PLAN FOR NEXT SESSION: continue core/lumbar and LE strengthening, update HEP as indicated; balance training and gait stability, SLS activities; floor transfers   Percival Spanish, PT 12/03/2022, 12:27 PM

## 2022-12-12 ENCOUNTER — Ambulatory Visit: Payer: Medicare PPO | Attending: Registered" | Admitting: Physical Therapy

## 2022-12-12 ENCOUNTER — Encounter: Payer: Self-pay | Admitting: Physical Therapy

## 2022-12-12 DIAGNOSIS — M5416 Radiculopathy, lumbar region: Secondary | ICD-10-CM | POA: Insufficient documentation

## 2022-12-12 DIAGNOSIS — R2689 Other abnormalities of gait and mobility: Secondary | ICD-10-CM | POA: Diagnosis present

## 2022-12-12 DIAGNOSIS — M6281 Muscle weakness (generalized): Secondary | ICD-10-CM | POA: Insufficient documentation

## 2022-12-12 DIAGNOSIS — R2681 Unsteadiness on feet: Secondary | ICD-10-CM | POA: Insufficient documentation

## 2022-12-12 NOTE — Therapy (Signed)
OUTPATIENT PHYSICAL THERAPY TREATMENT     Patient Name: [name] Trujillo MRN: [id number] DOB:04-22-1941, 82 y.o., female Today's Date: 12/12/2022  END OF SESSION:  PT End of Session - 12/12/22 1015     Visit Number 25    Date for PT Re-Evaluation 01/07/23    Authorization Type [employer]    Authorization Time Period 08/27/23 - 01/07/23: 10 additional visits as of 11/28/22, for a total of 34 until 01/07/23    Authorization - Visit Number 25    Authorization - Number of Visits 34    Progress Note Due on Visit 32   Recert/PN on visit [id number] (11/26/22)   PT Start Time 1015    PT Stop Time 1101    PT Time Calculation (min) 46 min    Activity Tolerance Patient tolerated treatment well    Behavior During Therapy [facility] for tasks assessed/performed                    Past Medical History:  Diagnosis Date   High cholesterol    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   She had ACUTE TAKOTSUBO (STRESS) CARDIOMYOPATHY with elevated Troponin Levels - this would be considered "Demand Ischemia - Demand Infarction" & NOT associated with ACS/CAD.     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  - Cath with NO CAD. ==> RESOLVED: f/u Echo 03/2022: EF 60-65%.   Past Surgical History:  Procedure Laterality Date   APPEN[age]CTOMY     17-18 yo   BACK SURGERY     Age 40   CESAREAN SECTION     ECTOPIC PREGNANCY SURGERY     LAPAROSCOPIC HYSTERECTOMY     LEFT HEART CATH AND CORONARY ANGIOGRAPHY N/A 01/09/2022   Procedure: LEFT HEART CATH AND CORONARY ANGIOGRAPHY;  Surgeon: Sherren Mocha, MD;  Location: [facility];  Service: CV:: Widely patent coronaries with mild nonobstructive LAD plaquing.   Right dominant system.  Normal LVEDP.  Based on clinical presentation, findings are consistent with acute Takotsubo Cardiomyopathy Syndrome   POSTERIOR FUSION LUMBAR SPINE  05/24/2022   ([place]; [name], MD): L2-L3 XLIF, L2-L3 POSTERIOR DECOMPRESSION AND FUSION   TRANSTHORACIC ECHOCARDIOGRAM  01/08/2022   Severely decreased LV function-EF 25 to 30%.  Mid to apical (mostly anterior) with normal basal motion.  GR 2 DD-elevated LAP.  Mildly dilated LA.  Aortic sclerosis with no stenosis.  No AI.  Normal MV with mild to moderate TR.  Mildly elevated RAP, and PAP (estimated 49 mg). If LAD CAD ruled out - consistent with Takotsubo CM Syndrome.   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    PCP: Derinda Late, MD   REFERRING PROVIDER:  Dorena Dew, NP for Hermenia Fiscal, MD   REFERRING DIAG: Z98.1 (ICD-10-CM) - S/P lumbar spinal fusion   THERAPY DIAG:  Muscle weakness (generalized)  Radiculopathy, lumbar region  Other abnormalities of gait and mobility  Unsteadiness on feet  RATIONALE FOR EVALUATION AND TREATMENT: Rehabilitation  ONSET DATE: 05/24/2022 - L2-3 XLIF/PSF   NEXT MD VISIT: September 2024    SUBJECTIVE:                                                                                                                                                                                                         SUBJECTIVE STATEMENT:  Pt reports she has  not tried any of the floor exercises since our last visit due to not feeling sure of herself. She has been walking more and continuing to focus focus on ankle and other proximal LE strengthening at home, often trying to reproduce exercises performed during therapy sessions.  PAIN: Are you having pain? No  PERTINENT HISTORY:  L2-3 XLIF/PSF 05/24/22; C5-7 ACDF 09/21/20; idiopathic progressive neuropathy; hypthyroidism; DDD; remote h/o back surgery at age 53; h/o scoliosis; Takotsubo cardiomyopathy 01/10/22   PRECAUTIONS: Back - No BLT, TLSO discontinued as of 09/11/22  WEIGHT BEARING RESTRICTIONS: No  FALLS:  Has patient fallen in last 6 months? No  LIVING ENVIRONMENT: Lives with: lives with their spouse Lives in: House/apartment Stairs: Yes: External: 5-6 steps; on left going up Has following equipment at home: Walker - 2 wheeled, shower chair, and hiking/walking poles  OCCUPATION: Retired Pharmacist, hospital  PLOF: Independent and Leisure: walking daily for total of ~1.5 miles; working in the yard; sewing/quilting    PATIENT GOALS: "Be able to move and do things in the house and yard w/o worrying about falling."   OBJECTIVE:   DIAGNOSTIC FINDINGS:  08/27/22 - Lumbar spine x-ray completed as part of postop series - results pending   07/01/22 - Lumbar spine x-ray: Expected postoperative appearance related to L2-L3 fixation.   06/12/21 - Lumbar MRI: Postoperative and degenerative changes of the lumbar spine as described, similar compared to July 28, 2020. At L2-L3, there is moderate spinal canal stenosis due to disc bulging, endplate osteophytic spurring, facet and ligamentum flavum hypertrophy. Milder spinal canal stenosis is seen at L3-L4. There is multilevel neural foraminal stenosis.   PATIENT SURVEYS:  Modified Oswestry 22 / 50 = 44.0%, 23 / 50 = 46.0% (10/15/22)   SCREENING FOR RED FLAGS: Bowel or bladder incontinence: Yes: bladder - takes Vesicare Spinal tumors: No Cauda equina  syndrome: No Compression fracture: No Abdominal aneurysm: No  COGNITION:  Overall cognitive status: Within functional limits for tasks assessed    SENSATION: WFL - Pt reports LE sensation has improved since surgery  MUSCLE LENGTH: NT  POSTURE:  rounded shoulders, forward head, and right pelvic obliquity  PALPATION: NT  LUMBAR ROM: Deferred on eval pending MD clearance to release her from post-surgical precautions   Active  AROM  10/08/22 * AROM  11/05/22 AROM 11/26/22 *  Flexion Hands to mid shin Hands to mid shin  Hands to toes with slight knee flexion   Extension 50% limited Limited 25%  Limited 25%  Right lateral flexion Hand to lateral knee Hand to lateral knee Hand to just below knee  Left lateral flexion Hand to lateral knee Hand to lateral knee Hand to just below knee  Right rotation 50% limited Limited due to balance  25% limited  Left rotation 50% limited WNL WNL  (Blank rows = not tested)  (* - ROM assessment limited in standing due to balance deficits)  LOWER EXTREMITY ROM:    Grossly WFL  LOWER EXTREMITY MMT:  (tested in sitting on eval, traditional testing positions for all subsequent testing)  MMT Right  eval Left    eval Right 10/08/22 Left 10/08/22 Right 11/05/22 Left 11/05/22 Right 11/18/22 Left 11/18/22  Hip flexion 4 4 4 4 4 4 4 4   Hip extension 4- 4- 4- 3- 4+ 4+ 4+ 4+  Hip abduction 4+ 4+ 4+ 4 4+ 4+ 4+ 4+  Hip adduction 4+ 4+ 3+ 4 3+ 4 4- 4  Hip internal rotation 4- 4- 4 4 4 4  4+ 4+  Hip external rotation 3+ 3+ 4- 3+ 4 4 4+ 4  Knee flexion 4+ 4+ 5 5 5 5 5 5   Knee extension 4+ 4+ 4+ 4+ 4+ 4+ 5 5  Ankle dorsiflexion 3- 3- 3 3- 3 3 3+ 3  Ankle plantarflexion 4- NWB 4- NWB 4+ NWB 4 NWB 4+ NWB 4+ NWB 4+ NWB 4+ NWB  Ankle inversion 3- 2- 3+ 2 4 2+ 4 2+  Ankle eversion 4- 3+ 4 3+ 4 4 4 4    (Blank rows = not tested)  LUMBAR SPECIAL TESTS:  NT  FUNCTIONAL TESTS:  5 times sit to stand: 22.19 sec  Timed up and go (TUG): 14.84 sec with hiking pole  (08/28/22) 10 meter walk test: 15.47 sec with hiking pole, Gait speed = 2.12 ft/sec (08/28/22) Berg Balance Scale: 27/56; < 36 high risk for falls (close to 100%) Functional gait assessment: 8/30; < 19 = high risk fall (08/28/22) Left hip Trendelenburg evident in right SLS  10/08/22 5xSTS: 22.87 sec TUG: 13.44 sec with hiking pole, 13.78 sec w/o AD 10MWT: 16.03 sec with hiking pole, 16.19 sec w/o AD;  Gait speed = 2.05 ft/sec with hiking pole, 2.03 ft/sec w/o AD Berg: 36/56; < 36 high risk for falls (close to 100%)  10/15/22 FGA: 18/30; < 19 = high risk fall   11/05/22 TUG: 11.16 sec - no hiking pole 10MWT: 14.88 sec -  no hiking pole  Gait speed = 2.20 ft/sec FGA: 22/30 w/o hiking pole; 19-24 = medium risk fall   11/18/22 Berg: 41/56; 37-45 significant risk for falls (>80%)   11/20/22 5xSTS: 10.88 sec  11/26/22 10MWT: 16.16 sec w/o AD Gait speed: 2.03 ft/sec FGA: 19/30; 19-24 = medium risk fall   GAIT: Distance walked: 60 Assistive device utilized:  single walking/hiking pole on R Level of assistance: SBA Gait pattern:  increased sway, step through pattern, decreased  stride length, trendelenburg, and lateral hip instability Comments: L>R foot slap   TODAY'S TREATMENT:   12/12/22 THERAPEUTIC EXERCISE: to improve flexibility, strength and mobility.  Verbal and tactile cues throughout for technique.  UBE: L2.0 x 6 min (3' each fwd & back), emphasis on core engagement L hip hike from floor on 2" step 2 x 10 L/R SLS on 2" step with opp LE short arc hip flex/ext maintaining level pelvis x 10 each L/R SLS on 2" step with opp LE hip ABD with slight extension maintaining level pelvis x 10 each L/R SLS on 2" step with opp LE hip flex/ABD/ext half-circle arc maintaining level pelvis x 10 each Wall squat + hip ADD isometric ball squeeze 10 x 5"  NEUROMUSCULAR RE-EDUCATION: To improve balance, proprioception, coordination, and reduce fall risk. L/R SLS + 5-way star tap x 5 cycles with  hiking pole for balance   12/03/22 THERAPEUTIC EXERCISE: to improve flexibility, strength and mobility.  Verbal and tactile cues throughout for technique.  Rec bike - L6 x 6 min (seat #2) Tall knee hip hinge (towel roll under ankles) 2 x 10 Quadruped to tall kneel (All's 4's PWR! Up) 2 x 10 Quadruped to 1/2 kneel (All's 4's PWR! Step) - LOB on 1st attempt causing her to have to sit down to floor - deferred further attempts Seated anterior tibialis stretch with foot under chair x 30" bil R/L lunge ("genuflect") x 10, UE support on counter & Airex pad under downward knee for cushioning if dropping too low to floor  Sustained functional squat + B shoulder flexion and extension 2 x 10   11/28/22 THERAPEUTIC EXERCISE: to improve flexibility, strength and mobility.  Verbal and tactile cues throughout for technique.  Rec bike - L5 x 6 min (seat #2) Quadruped to 1/2 kneel (All's 4's PWR! Step) - alternating sides x 10 Tall knee hip hinge (towel roll under ankles) x 10 Negative heel raises from 4" step x 10 with UE support on counter at sink Negative toe raises from 4" step x 10 with UE support on counter at sink  NEUROMUSCULAR RE-EDUCATION: To improve posture, balance, proprioception, coordination, ankle stability/control, and reduce fall risk.  Heel walking along counter 4 x 10 ft Foam balance beam (SBA of PT): Side stepping with hands on counter x 4 passes Side stepping with heels centered on beam and hands on counter x 4 passes Side stepping with forefoot centered on beam and hands on counter x 4 passes Tandem gait fwd and back x 5 passes   PATIENT EDUCATION:  Education details: progress with PT and ongoing PT POC Person educated: Patient Education method: Explanation Education comprehension: verbalized understanding  HOME EXERCISE PROGRAM: Access Code: [email or web address] URL: [email or web address] Date: 11/28/2022 Prepared by: Annie Paras  Exercises - Seated Heel Toe Raises   - 1 x daily - 7 x weekly - 2 sets - 10 reps - 3 sec hold - Standing Isometric Hip Abduction with Ball on Wall  - 1 x daily - 7 x weekly - 2 sets - 10 reps - 3 sec hold - Seated Transversus Abdominis Bracing  - 2 x daily - 7 x weekly - 2 sets - 10 reps - 3-5 sec hold - Seated Isometric Hip Abduction with Resistance  - 1 x daily - 3 x weekly - 2 sets - 10 reps - 3 sec hold - Seated March with Resistance  - 1 x daily - 3 x weekly - 2 sets - 10 reps - 3  sec hold - Seated Shoulder Row with Anchored Resistance  - 1 x daily - 3 x weekly - 2 sets - 10 reps - 3-5 hold hold - Seated Single Arm Shoulder Row with Anchored Resistance  - 1 x daily - 3 x weekly - 2 sets - 10 reps - 3 sec hold - Seated Anti-Rotation Press With Anchored Resistance  - 1 x daily - 3 x weekly - 2 sets - 10 reps - 3 sec hold - Tall Kneeling Hip Hinge  - 1 x daily - 3-4 x weekly - 2 sets - 10 reps - 3 sec hold  Quadruped PWR! Up & Step  ASSESSMENT:  CLINICAL IMPRESSION:  Kharisma continues to demonstrate a Trendelenburg hip drop during gait and SLS activities, therefore targeted glute strengthening with emphasis on glute medius today.  Increased difficulty noted performing L-sided hip motions during R SLS with harder time maintaining level pelvis while coordinating movement of L leg. Gracia remains very motivated with PT and will continue to benefit from skilled PT to address ongoing strength and balance deficits to improve mobility and activity tolerance with decreased risk for loss of balance.  OBJECTIVE IMPAIRMENTS: Abnormal gait, decreased activity tolerance, decreased balance, decreased coordination, decreased endurance, decreased mobility, difficulty walking, decreased ROM, decreased strength, decreased safety awareness, impaired perceived functional ability, impaired flexibility, improper body mechanics, postural dysfunction, and pain.   ACTIVITY LIMITATIONS: carrying, lifting, bending, sitting, standing, squatting, sleeping,  stairs, transfers, bed mobility, bathing, toileting, dressing, reach over head, locomotion level, and caring for others  PARTICIPATION LIMITATIONS: meal prep, cleaning, laundry, interpersonal relationship, driving, shopping, community activity, yard work, and church  PERSONAL FACTORS: Age, Past/current experiences, Time since onset of injury/illness/exacerbation, and 3+ comorbidities: C5-7 ACDF 09/21/20; idiopathic progressive neuropathy; hypthyroidism; DDD; remote h/o back surgery at age 38; h/o scoliosis; Takotsubo cardiomyopathy 01/10/22   are also affecting patient's functional outcome.   REHAB POTENTIAL: Good  CLINICAL DECISION MAKING: Evolving/moderate complexity  EVALUATION COMPLEXITY: Moderate   GOALS: Goals reviewed with patient? Yes  SHORT TERM GOALS: Target date: 09/23/2022  Patient will be independent with initial HEP to improve outcomes and carryover.  Baseline:  Goal status: MET  09/13/22  2.  Complete balance assessment and update LTGs as indicated.  Baseline:  Goal status: MET  08/28/22  LONG TERM GOALS: Target date: 10/21/2022, extended to 12/03/2022, extended to 01/07/2023   Patient will be independent with ongoing/advanced HEP for self-management at home.  Baseline:  Goal status: IN PROGRESS  11/26/22 - met for current HEP  2.  Patient to demonstrate ability to achieve and maintain good spinal alignment/posturing and body mechanics needed for daily activities. Baseline:  Goal status: MET  10/08/22 - pt demonstrates good awareness of neutral spine alignment; 10/29/22 - refresher indicated in relation to household cleaning tasks  3.  Patient will demonstrate functional pain free lumbar ROM to perform ADLs.   Baseline:  Goal status: PARTIALLY MET  11/26/22 - mostly WFL w/o pain but still mildly limited in extension and R rotation (balance also continues to limit functional ROM)  4.  Patient will demonstrate improved B proximal LE strength to >/= 4 to 4+/5 for improved  stability and ease of mobility. Baseline: refer to MMT table above Goal status: IN PROGRESS  11/18/22 - knee strength 5/5, but continued hip weakness esp with fatigue  5  Patient will demonstrate improved B ankle strength to >/= 3+ to 4-/5 for improved gait stability. Baseline: refer to MMT table above Goal status: PARTIALLY  MET  11/18/22 - Met for R ankle, although limited endurance with R DF creating foot slap with fatigue, In progress for L ankle  6.  Patient will ambulate with improved gait pattern with decreased B foot drop/slap and no evidence of instability/LOB. Baseline:  Goal status: IN PROGRESS  12/12/22 - Continued intermittent L>R foot slap ~30% of the time when walking, more evident with fatigue, as well as L>R Trendelenburg hip drop.   7. Patient will report </= 32% on Modified Oswestry to demonstrate improved functional ability with decreased pain interference. Baseline: 22 / 50 = 44.0% (eval); 23 / 50 = 46.0% (10/15/22) Goal status: MET  11/05/22:11 / 50 = 22.0 %  8.  Patient will improve Berg score to >/= 36/56 to improve safety and stability with ADLs in standing and reduce risk for falls. Baseline: 27/56 Goal status: MET and REVISED (see 8a.below) 10/08/22 - Berg = 36/56  8a.  Patient will improve Berg score to >/= 44/56 to improve safety and stability with ADLs in standing and reduce risk for falls. Baseline: 36/56 (10/08/22) Goal status: IN PROGRESS  11/18/22 - Berg = 41/56  9.  Patient will improve FGA score to >/= 19/30 to improve gait stability and reduce risk for falls. Baseline: 8/30 (08/28/22); 18/30 (10/15/22) Goal status: MET  and REVISED (see 9a below) 11/05/22 - FGA = 22/30    9a.  Patient will improve FGA score to >/= 24/30 to improve gait stability and reduce risk for falls. Baseline:  Goal status: IN PROGRESS  11/26/22 - 19/30  10.  Patient will be able to transition floor to/from stand w/o assistance or LOB to allow her complete chores around her home and work in  her garden. Baseline:  Goal status: IN PROGRESS  11/28/22    PLAN:  PT FREQUENCY: 2x/week  PT DURATION: 6 weeks  PLANNED INTERVENTIONS: Therapeutic exercises, Therapeutic activity, Neuromuscular re-education, Balance training, Gait training, Patient/Family education, Self Care, Joint mobilization, DME instructions, Aquatic Therapy, Dry Needling, Electrical stimulation, Cryotherapy, Moist heat, scar mobilization, Taping, Ultrasound, Ionotophoresis 4mg /ml Dexamethasone, Manual therapy, and Re-evaluation - [phone] approved for E-stim, but [id number] (E-stim) not approved per [facility] prior authorization  PLAN FOR NEXT SESSION: revisit quadruped and tall kneeling exercises; continue core/lumbar and LE strengthening, update HEP as indicated; balance training and gait stability, SLS activities; floor transfers   Percival Spanish, PT 12/12/2022, 12:39 PM

## 2022-12-17 ENCOUNTER — Encounter: Payer: Self-pay | Admitting: Physical Therapy

## 2022-12-17 ENCOUNTER — Ambulatory Visit: Payer: Medicare PPO | Admitting: Physical Therapy

## 2022-12-17 DIAGNOSIS — M5416 Radiculopathy, lumbar region: Secondary | ICD-10-CM

## 2022-12-17 DIAGNOSIS — M6281 Muscle weakness (generalized): Secondary | ICD-10-CM

## 2022-12-17 DIAGNOSIS — R2689 Other abnormalities of gait and mobility: Secondary | ICD-10-CM

## 2022-12-17 DIAGNOSIS — R2681 Unsteadiness on feet: Secondary | ICD-10-CM

## 2022-12-17 NOTE — Therapy (Signed)
OUTPATIENT PHYSICAL THERAPY TREATMENT     Patient Name: [name] Trujillo Trujillo MRN: [id number] DOB:12-20-1940, 82 y.o., female Today's Date: 12/17/2022  END OF SESSION:  PT End of Session - 12/17/22 1315     Visit Number 26    Date for PT Re-Evaluation 01/07/23    Authorization Type [employer]    Authorization Time Period 08/27/23 - 01/07/23: 10 additional visits as of 11/28/22, for a total of 34 until 01/07/23    Authorization - Visit Number 26    Authorization - Number of Visits 34    Progress Note Due on Visit 32   Recert/PN on visit #22 (11/26/22)   PT Start Time 1315    PT Stop Time 1400    PT Time Calculation (min) 45 min    Activity Tolerance Patient tolerated treatment well    Behavior During Therapy [facility] for tasks assessed/performed                    Past Medical History:  Diagnosis Date   High cholesterol    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   She had ACUTE TAKOTSUBO (STRESS) CARDIOMYOPATHY with elevated Troponin Levels - this would be considered "Demand Ischemia - Demand Infarction" & NOT associated with ACS/CAD.     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  - Cath with NO CAD. ==> RESOLVED: Trujillo/u Echo 03/2022: EF 60-65%.   Past Surgical History:  Procedure Laterality Date   APPENDECTOMY     65-18 yo   BACK SURGERY     Age 55   CESAREAN SECTION     ECTOPIC PREGNANCY SURGERY     LAPAROSCOPIC HYSTERECTOMY     LEFT HEART CATH AND CORONARY ANGIOGRAPHY N/A 01/09/2022   Procedure: LEFT HEART CATH AND CORONARY ANGIOGRAPHY;  Surgeon: Tonny Bollman, MD;  Location: [facility] INVASIVE CV LAB;  Service: CV:: Widely patent coronaries with mild nonobstructive LAD plaquing.   Right dominant system.  Normal LVEDP.  Based on clinical presentation, findings are consistent with acute Takotsubo Cardiomyopathy Syndrome   POSTERIOR FUSION LUMBAR SPINE  05/24/2022   [facility], [place]; [name], MD): L2-L3 XLIF, L2-L3 POSTERIOR DECOMPRESSION AND FUSION   TRANSTHORACIC ECHOCARDIOGRAM  01/08/2022   Severely decreased LV function-EF 25 to 30%.  Mid to apical (mostly anterior) with normal basal motion.  GR 2 DD-elevated LAP.  Mildly dilated LA.  Aortic sclerosis with no stenosis.  No AI.  Normal MV with mild to moderate TR.  Mildly elevated RAP, and PAP (estimated 49 mg). If LAD CAD ruled out - consistent with Takotsubo CM Syndrome.   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    PCP: [name] Putt, MD   REFERRING PROVIDER:  Rondel Trujillo, [name], NP for [name] Trujillo Trujillo, [name] F, MD   REFERRING DIAG: Z98.1 (ICD-10-CM) - S/P lumbar spinal fusion   THERAPY DIAG:  Muscle weakness (generalized)  Radiculopathy, lumbar region  Other abnormalities of gait and mobility  Unsteadiness on feet  RATIONALE FOR EVALUATION AND TREATMENT: Rehabilitation  ONSET DATE: 05/24/2022 - L2-3 XLIF/PSF   NEXT MD VISIT: September 2024    SUBJECTIVE:                                                                                                                                                                                                         SUBJECTIVE STATEMENT:  Pt notes  improvement in her balance as she is now able to stand to take her coat off w/o leaning against something. She still notes fatigue when it comes to walking.  PAIN: Are you having pain? No  PERTINENT HISTORY:  L2-3 XLIF/PSF 05/24/22; C5-7 ACDF 09/21/20; idiopathic progressive neuropathy; hypthyroidism; DDD; remote h/o back surgery at age 82; h/o scoliosis; Takotsubo cardiomyopathy 01/10/22   PRECAUTIONS: Back - No BLT, TLSO discontinued as of 09/11/22  WEIGHT BEARING RESTRICTIONS: No  FALLS:  Has patient fallen in last 6 months? No  LIVING ENVIRONMENT: Lives with: lives with their spouse Lives in: House/apartment Stairs: Yes: External: 5-6 steps; on left going up Has following equipment at home: Walker - 2 wheeled, shower chair, and hiking/walking poles  OCCUPATION: Retired Runner, broadcasting/film/videoteacher  PLOF: Independent and Leisure: walking daily for total of ~1.5 miles; working in the yard; sewing/quilting    PATIENT GOALS: "Be able to move and do things in the house and yard w/o worrying about falling."   OBJECTIVE:   DIAGNOSTIC FINDINGS:  08/27/22 - Lumbar spine x-ray completed as part of postop series - results pending   07/01/22 - Lumbar spine x-ray: Expected postoperative appearance related to L2-L3 fixation.   06/12/21 - Lumbar MRI: Postoperative and degenerative changes of the lumbar spine as described, similar compared to July 28, 2020. At L2-L3, there is moderate spinal canal stenosis due to disc bulging, endplate osteophytic spurring, facet and ligamentum flavum hypertrophy. Milder spinal canal stenosis is seen at L3-L4. There is multilevel neural foraminal stenosis.   PATIENT SURVEYS:  Modified Oswestry 22 / 50 = 44.0%, 23 / 50 = 46.0% (10/15/22)   SCREENING FOR RED FLAGS: Bowel or bladder incontinence: Yes: bladder - takes Vesicare Spinal tumors: No Cauda equina syndrome: No Compression fracture: No Abdominal aneurysm: No  COGNITION:  Overall cognitive status: Within functional  limits for tasks assessed    SENSATION: WFL - Pt reports  LE sensation has improved since surgery  MUSCLE LENGTH: NT  POSTURE:  rounded shoulders, forward head, and right pelvic obliquity  PALPATION: NT  LUMBAR ROM: Deferred on eval pending MD clearance to release her from post-surgical precautions   Active  AROM  10/08/22 * AROM  11/05/22 AROM 11/26/22 *  Flexion Hands to mid shin Hands to mid shin  Hands to toes with slight knee flexion   Extension 50% limited Limited 25%  Limited 25%  Right lateral flexion Hand to lateral knee Hand to lateral knee Hand to just below knee  Left lateral flexion Hand to lateral knee Hand to lateral knee Hand to just below knee  Right rotation 50% limited Limited due to balance  25% limited  Left rotation 50% limited WNL WNL  (Blank rows = not tested)  (* - ROM assessment limited in standing due to balance deficits)  LOWER EXTREMITY ROM:    Grossly WFL  LOWER EXTREMITY MMT:  (tested in sitting on eval, traditional testing positions for all subsequent testing)  MMT Right  eval Left    eval Right 10/08/22 Left 10/08/22 Right 11/05/22 Left 11/05/22 Right 11/18/22 Left 11/18/22  Hip flexion 4 4 4 4 4 4 4 4   Hip extension 4- 4- 4- 3- 4+ 4+ 4+ 4+  Hip abduction 4+ 4+ 4+ 4 4+ 4+ 4+ 4+  Hip adduction 4+ 4+ 3+ 4 3+ 4 4- 4  Hip internal rotation 4- 4- 4 4 4 4  4+ 4+  Hip external rotation 3+ 3+ 4- 3+ 4 4 4+ 4  Knee flexion 4+ 4+ 5 5 5 5 5 5   Knee extension 4+ 4+ 4+ 4+ 4+ 4+ 5 5  Ankle dorsiflexion 3- 3- 3 3- 3 3 3+ 3  Ankle plantarflexion 4- NWB 4- NWB 4+ NWB 4 NWB 4+ NWB 4+ NWB 4+ NWB 4+ NWB  Ankle inversion 3- 2- 3+ 2 4 2+ 4 2+  Ankle eversion 4- 3+ 4 3+ 4 4 4 4    (Blank rows = not tested)  LUMBAR SPECIAL TESTS:  NT  FUNCTIONAL TESTS:  5 times sit to stand: 22.19 sec  Timed up and go (TUG): 14.84 sec with hiking pole (08/28/22) 10 meter walk test: 15.47 sec with hiking pole, Gait speed = 2.12 ft/sec (08/28/22) Berg Balance Scale: 27/56; < 36 high  risk for falls (close to 100%) Functional gait assessment: 8/30; < 19 = high risk fall (08/28/22) Left hip Trendelenburg evident in right SLS  10/08/22 5xSTS: 22.87 sec TUG: 13.44 sec with hiking pole, 13.78 sec w/o AD : 16.03 sec with hiking pole, 16.19 sec w/o AD;  Gait speed = 2.05 ft/sec with hiking pole, 2.03 ft/sec w/o AD Berg: 36/56; < 36 high risk for falls (close to 100%)  10/15/22 FGA: 18/30; < 19 = high risk fall   11/05/22 TUG: 11.16 sec - no hiking pole : 14.88 sec -  no hiking pole  Gait speed = 2.20 ft/sec FGA: 22/30 w/o hiking pole; 19-24 = medium risk fall   11/18/22 Berg: 41/56; 37-45 significant risk for falls (>80%)   11/20/22 5xSTS: 10.88 sec  11/26/22 : 16.16 sec w/o AD Gait speed: 2.03 ft/sec FGA: 19/30; 19-24 = medium risk fall   GAIT: Distance walked: 60 Assistive device utilized:  single walking/hiking pole on R Level of assistance: SBA Gait pattern:  increased sway, step through pattern, decreased stride length, trendelenburg, and lateral hip instability Comments: L>R foot slap   TODAY'S TREATMENT:   12/17/22 THERAPEUTIC  EXERCISE: to improve flexibility, strength and mobility.  Verbal and tactile cues throughout for technique.  Rec bike - L4 x 6 min (seat #2) Quadruped to tall kneel (All's 4's PWR! Up) x 15 Quadruped alt step to side x 10 Quadruped step to 1/2 tall knee x 5 each side, SBA of PT Quadruped to alt 1/2 tall kneel (All's 4's PWR! Step) x 5 each side, SBA/CGA of PT SLS with RDL reach to mat table x 10 each side, opp hand light support on back of chair Quadruped over peanut ball alt UE raises x 10 Quadruped over peanut ball alt LE raises x 10  NEUROMUSCULAR RE-EDUCATION: To improve balance, proprioception, coordination, and reduce fall risk. L/R SLS + 5-way star tap x 5 cycles with hiking pole for balance   12/12/22 THERAPEUTIC EXERCISE: to improve flexibility, strength and mobility.  Verbal and tactile cues throughout  for technique.  UBE: L2.0 x 6 min (3' each fwd & back), emphasis on core engagement L hip hike from floor on 2" step 2 x 10 L/R SLS on 2" step with opp LE short arc hip flex/ext maintaining level pelvis x 10 each L/R SLS on 2" step with opp LE hip ABD with slight extension maintaining level pelvis x 10 each L/R SLS on 2" step with opp LE hip flex/ABD/ext half-circle arc maintaining level pelvis x 10 each Wall squat + hip ADD isometric ball squeeze 10 x 5"  NEUROMUSCULAR RE-EDUCATION: To improve balance, proprioception, coordination, and reduce fall risk. L/R SLS + 5-way star tap x 5 cycles with hiking pole for balance   12/03/22 THERAPEUTIC EXERCISE: to improve flexibility, strength and mobility.  Verbal and tactile cues throughout for technique.  Rec bike - L6 x 6 min (seat #2) Tall knee hip hinge (towel roll under ankles) 2 x 10 Quadruped to tall kneel (All's 4's PWR! Up) 2 x 10 Quadruped to 1/2 kneel (All's 4's PWR! Step) - LOB on 1st attempt causing her to have to sit down to floor - deferred further attempts Seated anterior tibialis stretch with foot under chair x 30" bil R/L lunge ("genuflect") x 10, UE support on counter & Airex pad under downward knee for cushioning if dropping too low to floor  Sustained functional squat + B shoulder flexion and extension 2 x 10   PATIENT EDUCATION:  Education details: progress with PT and ongoing PT POC Person educated: Patient Education method: Explanation Education comprehension: verbalized understanding  HOME EXERCISE PROGRAM: Access Code: [email or web address] URL: [email or web address] Date: 11/28/2022 Prepared by: Glenetta Hew  Exercises - Seated Heel Toe Raises  - 1 x daily - 7 x weekly - 2 sets - 10 reps - 3 sec hold - Standing Isometric Hip Abduction with Ball on Wall  - 1 x daily - 7 x weekly - 2 sets - 10 reps - 3 sec hold - Seated Transversus Abdominis Bracing  - 2 x daily - 7 x weekly - 2 sets - 10 reps - 3-5 sec hold -  Seated Isometric Hip Abduction with Resistance  - 1 x daily - 3 x weekly - 2 sets - 10 reps - 3 sec hold - Seated March with Resistance  - 1 x daily - 3 x weekly - 2 sets - 10 reps - 3 sec hold - Seated Shoulder Row with Anchored Resistance  - 1 x daily - 3 x weekly - 2 sets - 10 reps - 3-5 hold hold - Seated Single Arm Shoulder Row with Anchored Resistance  -  1 x daily - 3 x weekly - 2 sets - 10 reps - 3 sec hold - Seated Anti-Rotation Press With Anchored Resistance  - 1 x daily - 3 x weekly - 2 sets - 10 reps - 3 sec hold - Tall Kneeling Hip Hinge  - 1 x daily - 3-4 x weekly - 2 sets - 10 reps - 3 sec hold  Quadruped PWR! Up & Step  ASSESSMENT:  CLINICAL IMPRESSION:  Rehana notes improving static standing tolerance and balance, able to stand for 4 hrs at a cutting table during a quilting workshop and able to remove her jacket w/o having to lean against furniture or wall. We revisited quadruped transitions to work on ease of floor to stand transitions to prepare her for working in her garden and help with retrieving laundry from the dryer with close guarding but no LOB today. SLS RDL reaches utilized to work on balance as well as better body mechanics for picking things up. She still notes fatigue in her low back with walking, therefore targeted gravity resisted posterior chain strengthening. All exercises well tolerated with no increased pain.   OBJECTIVE IMPAIRMENTS: Abnormal gait, decreased activity tolerance, decreased balance, decreased coordination, decreased endurance, decreased mobility, difficulty walking, decreased ROM, decreased strength, decreased safety awareness, impaired perceived functional ability, impaired flexibility, improper body mechanics, postural dysfunction, and pain.   ACTIVITY LIMITATIONS: carrying, lifting, bending, sitting, standing, squatting, sleeping, stairs, transfers, bed mobility, bathing, toileting, dressing, reach over head, locomotion level, and caring for  others  PARTICIPATION LIMITATIONS: meal prep, cleaning, laundry, interpersonal relationship, driving, shopping, community activity, yard work, and church  PERSONAL FACTORS: Age, Past/current experiences, Time since onset of injury/illness/exacerbation, and 3+ comorbidities: C5-7 ACDF 09/21/20; idiopathic progressive neuropathy; hypthyroidism; DDD; remote h/o back surgery at age 16; h/o scoliosis; Takotsubo cardiomyopathy 01/10/22   are also affecting patient's functional outcome.   REHAB POTENTIAL: Good  CLINICAL DECISION MAKING: Evolving/moderate complexity  EVALUATION COMPLEXITY: Moderate   GOALS: Goals reviewed with patient? Yes  SHORT TERM GOALS: Target date: 09/23/2022  Patient will be independent with initial HEP to improve outcomes and carryover.  Baseline:  Goal status: MET  09/13/22  2.  Complete balance assessment and update LTGs as indicated.  Baseline:  Goal status: MET  08/28/22  LONG TERM GOALS: Target date: 10/21/2022, extended to 12/03/2022, extended to 01/07/2023   Patient will be independent with ongoing/advanced HEP for self-management at home.  Baseline:  Goal status: IN PROGRESS  11/26/22 - met for current HEP  2.  Patient to demonstrate ability to achieve and maintain good spinal alignment/posturing and body mechanics needed for daily activities. Baseline:  Goal status: MET  10/08/22 - pt demonstrates good awareness of neutral spine alignment; 10/29/22 - refresher indicated in relation to household cleaning tasks  3.  Patient will demonstrate functional pain free lumbar ROM to perform ADLs.   Baseline:  Goal status: PARTIALLY MET  11/26/22 - mostly WFL w/o pain but still mildly limited in extension and R rotation (balance also continues to limit functional ROM)  4.  Patient will demonstrate improved B proximal LE strength to >/= 4 to 4+/5 for improved stability and ease of mobility. Baseline: refer to MMT table above Goal status: IN PROGRESS  11/18/22 - knee strength  5/5, but continued hip weakness esp with fatigue  5  Patient will demonstrate improved B ankle strength to >/= 3+ to 4-/5 for improved gait stability. Baseline: refer to MMT table above Goal status: PARTIALLY MET  11/18/22 -  Met for R ankle, although limited endurance with R DF creating foot slap with fatigue, In progress for L ankle  6.  Patient will ambulate with improved gait pattern with decreased B foot drop/slap and no evidence of instability/LOB. Baseline:  Goal status: IN PROGRESS  12/12/22 - Continued intermittent L>R foot slap ~30% of the time when walking, more evident with fatigue, as well as L>R Trendelenburg hip drop.   7. Patient will report </= 32% on Modified Oswestry to demonstrate improved functional ability with decreased pain interference. Baseline: 22 / 50 = 44.0% (eval); 23 / 50 = 46.0% (10/15/22) Goal status: MET  11/05/22:11 / 50 = 22.0 %  8.  Patient will improve Berg score to >/= 36/56 to improve safety and stability with ADLs in standing and reduce risk for falls. Baseline: 27/56 Goal status: MET and REVISED (see 8a.below) 10/08/22 - Berg = 36/56  8a.  Patient will improve Berg score to >/= 44/56 to improve safety and stability with ADLs in standing and reduce risk for falls. Baseline: 36/56 (10/08/22) Goal status: IN PROGRESS  11/18/22 - Berg = 41/56  9.  Patient will improve FGA score to >/= 19/30 to improve gait stability and reduce risk for falls. Baseline: 8/30 (08/28/22); 18/30 (10/15/22) Goal status: MET  and REVISED (see 9a below) 11/05/22 - FGA = 22/30    9a.  Patient will improve FGA score to >/= 24/30 to improve gait stability and reduce risk for falls. Baseline:  Goal status: IN PROGRESS  11/26/22 - 19/30  10.  Patient will be able to transition floor to/from stand w/o assistance or LOB to allow her complete chores around her home and work in her garden. Baseline:  Goal status: IN PROGRESS  11/28/22    PLAN:  PT FREQUENCY: 2x/week  PT DURATION: 6  weeks  PLANNED INTERVENTIONS: Therapeutic exercises, Therapeutic activity, Neuromuscular re-education, Balance training, Gait training, Patient/Family education, Self Care, Joint mobilization, DME instructions, Aquatic Therapy, Dry Needling, Electrical stimulation, Cryotherapy, Moist heat, scar mobilization, Taping, Ultrasound, Ionotophoresis 4mg /ml Dexamethasone, Manual therapy, and Re-evaluation - [phone] approved for E-stim, but [id number] (E-stim) not approved per [facility] prior authorization  PLAN FOR NEXT SESSION: quadruped and tall kneeling exercises to promote better control and safety with working in the garden; continue core/lumbar and LE strengthening, update HEP as indicated; balance training and gait stability, SLS activities; floor transfers   Marry Guan, PT 12/17/2022, 2:22 PM

## 2022-12-19 ENCOUNTER — Encounter: Payer: Self-pay | Admitting: Physical Therapy

## 2022-12-19 ENCOUNTER — Ambulatory Visit: Payer: Medicare PPO | Admitting: Physical Therapy

## 2022-12-19 DIAGNOSIS — M5416 Radiculopathy, lumbar region: Secondary | ICD-10-CM

## 2022-12-19 DIAGNOSIS — R2681 Unsteadiness on feet: Secondary | ICD-10-CM

## 2022-12-19 DIAGNOSIS — M6281 Muscle weakness (generalized): Secondary | ICD-10-CM

## 2022-12-19 DIAGNOSIS — R2689 Other abnormalities of gait and mobility: Secondary | ICD-10-CM

## 2022-12-19 NOTE — Therapy (Signed)
OUTPATIENT PHYSICAL THERAPY TREATMENT     Patient Name: [name] Trujillo MRN: [id number] DOB:[birth year], 82 y.o., female Today's Date: 12/19/2022  END OF SESSION:  PT End of Session - 12/19/22 1359     Visit Number 27    Date for PT Re-Evaluation 01/07/23    Authorization Type [employer]    Authorization Time Period 08/27/23 - 01/07/23: 10 additional visits as of 11/28/22, for a total of 34 until 01/07/23    Authorization - Visit Number 27    Authorization - Number of Visits 34    Progress Note Due on Visit 32   Recert/PN on visit #22 (11/26/22)   PT Start Time 1359    PT Stop Time 1444    PT Time Calculation (min) 45 min    Activity Tolerance Patient tolerated treatment well    Behavior During Therapy [place] for tasks assessed/performed                    Past Medical History:  Diagnosis Date   High cholesterol    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   She had ACUTE TAKOTSUBO (STRESS) CARDIOMYOPATHY with elevated Troponin Levels - this would be considered "Demand Ischemia - Demand Infarction" & NOT associated with ACS/CAD.     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  - Cath with NO CAD. ==> RESOLVED: f/u Echo 03/2022: EF 60-65%.   Past Surgical History:  Procedure Laterality Date   APPENDECTOMY     65-18 yo   BACK SURGERY     Age 61   CESAREAN SECTION     ECTOPIC PREGNANCY SURGERY     LAPAROSCOPIC HYSTERECTOMY     LEFT HEART CATH AND CORONARY ANGIOGRAPHY N/A 01/09/2022   Procedure: LEFT HEART CATH AND CORONARY ANGIOGRAPHY;  Surgeon: Tonny Bollman, MD;  Location: [facility] INVASIVE CV LAB;  Service: CV:: Widely patent coronaries with mild nonobstructive LAD plaquing.   Right dominant system.  Normal LVEDP.  Based on clinical presentation, findings are consistent with acute Takotsubo Cardiomyopathy Syndrome   POSTERIOR FUSION LUMBAR SPINE  05/24/2022   [facility], [place]; [name], MD): L2-L3 XLIF, L2-L3 POSTERIOR DECOMPRESSION AND FUSION   TRANSTHORACIC ECHOCARDIOGRAM  01/08/2022   Severely decreased LV function-EF 25 to 30%.  Mid to apical (mostly anterior) with normal basal motion.  GR 2 DD-elevated LAP.  Mildly dilated LA.  Aortic sclerosis with no stenosis.  No AI.  Normal MV with mild to moderate TR.  Mildly elevated RAP, and PAP (estimated 49 mg). If LAD CAD ruled out - consistent with Takotsubo CM Syndrome.   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    PCP: Mosetta Putt, MD   REFERRING PROVIDER:  [name], [name], NP for Marlaine HindHamilton, John F, MD   REFERRING DIAG: Z98.1 (ICD-10-CM) - S/P lumbar spinal fusion   THERAPY DIAG:  Muscle weakness (generalized)  Radiculopathy, lumbar region  Other abnormalities of gait and mobility  Unsteadiness on feet  RATIONALE FOR EVALUATION AND TREATMENT: Rehabilitation  ONSET DATE: 05/24/2022 - L2-3 XLIF/PSF   NEXT MD VISIT: September 2024    SUBJECTIVE:                                                                                                                                                                                                         SUBJECTIVE STATEMENT:  Pt reports she has  been worried about her son who is in the hospital with complications following a kidney transplant, but notes relief that it does not seem to be rejection.  Pt reports she walked twice as far as she normally does and was fatigued but not exhausted.  PAIN: Are you having pain? No  PERTINENT HISTORY:  L2-3 XLIF/PSF 05/24/22; C5-7 ACDF 09/21/20; idiopathic progressive neuropathy; hypthyroidism; DDD; remote h/o back surgery at age 82; h/o scoliosis; Takotsubo cardiomyopathy 01/10/22   PRECAUTIONS: Back - No BLT, TLSO discontinued as of 09/11/22  WEIGHT BEARING RESTRICTIONS: No  FALLS:  Has patient fallen in last 6 months? No  LIVING ENVIRONMENT: Lives with: lives with their spouse Lives in: House/apartment Stairs: Yes: External: 5-6 steps; on left going up Has following equipment at home: Walker - 2 wheeled, shower chair, and hiking/walking poles  OCCUPATION: Retired Runner, broadcasting/film/videoteacher  PLOF: Independent and Leisure: walking daily for total of ~1.5 miles; working in the yard; sewing/quilting    PATIENT GOALS: "Be able to move and do things in the house and yard w/o worrying about falling."   OBJECTIVE:   DIAGNOSTIC FINDINGS:  08/27/22 - Lumbar spine x-ray completed as part of postop series - results pending   07/01/22 - Lumbar spine x-ray: Expected postoperative appearance related to L2-L3 fixation.   06/12/21 - Lumbar MRI: Postoperative and degenerative changes of the lumbar spine as described, similar compared to July 28, 2020. At L2-L3, there is moderate spinal canal stenosis due to disc bulging, endplate osteophytic spurring, facet and ligamentum flavum hypertrophy. Milder spinal canal stenosis is seen at L3-L4. There is multilevel neural foraminal stenosis.   PATIENT SURVEYS:  Modified Oswestry 22 / 50 = 44.0%, 23 / 50 = 46.0% (10/15/22)   SCREENING FOR RED FLAGS: Bowel or bladder incontinence: Yes: bladder - takes Vesicare Spinal tumors: No Cauda equina syndrome: No Compression  fracture: No Abdominal aneurysm: No  COGNITION:  Overall cognitive status: Within functional limits for tasks assessed    SENSATION: WFL - Pt reports LE sensation has improved since surgery  MUSCLE LENGTH: NT  POSTURE:  rounded shoulders, forward head, and right pelvic obliquity  PALPATION: NT  LUMBAR ROM: Deferred on eval pending MD clearance to release her from post-surgical precautions   Active  AROM  10/08/22 * AROM  11/05/22 AROM 11/26/22 *  Flexion Hands to mid shin Hands to mid shin  Hands to toes with slight knee flexion   Extension 50% limited Limited 25%  Limited 25%  Right lateral flexion Hand to lateral knee Hand to lateral knee Hand to just below knee  Left lateral flexion Hand to lateral knee Hand to lateral knee Hand to just below knee  Right rotation 50% limited Limited due to balance  25% limited  Left rotation 50% limited WNL WNL  (Blank rows = not tested)  (* - ROM assessment limited in standing due to balance deficits)  LOWER EXTREMITY ROM:    Grossly WFL  LOWER EXTREMITY MMT:  (tested in sitting on eval, traditional testing positions for all subsequent testing)  MMT Right  eval Left    eval Right 10/08/22 Left 10/08/22 Right 11/05/22 Left 11/05/22 Right 11/18/22 Left 11/18/22  Hip flexion 4 4 4 4 4 4 4 4   Hip extension 4- 4- 4- 3- 4+ 4+ 4+ 4+  Hip abduction 4+ 4+ 4+ 4 4+ 4+ 4+ 4+  Hip adduction 4+ 4+ 3+ 4 3+ 4 4- 4  Hip internal rotation 4- 4- 4 4 4 4  4+ 4+  Hip external rotation 3+ 3+ 4- 3+ 4 4 4+ 4  Knee flexion 4+ 4+ 5 5 5 5 5 5   Knee extension 4+ 4+ 4+ 4+ 4+ 4+ 5 5  Ankle dorsiflexion 3- 3- 3 3- 3 3 3+ 3  Ankle plantarflexion 4- NWB 4- NWB 4+ NWB 4 NWB 4+ NWB 4+ NWB 4+ NWB 4+ NWB  Ankle inversion 3- 2- 3+ 2 4 2+ 4 2+  Ankle eversion 4- 3+ 4 3+ 4 4 4 4    (Blank rows = not tested)  LUMBAR SPECIAL TESTS:  NT  FUNCTIONAL TESTS:  5 times sit to stand: 22.19 sec  Timed up and go (TUG): 14.84 sec with hiking pole (08/28/22) 10 meter walk test: 15.47  sec with hiking pole, Gait speed = 2.12 ft/sec (08/28/22) Berg Balance Scale: 27/56; < 36 high risk for falls (close to 100%) Functional gait assessment: 8/30; < 19 = high risk fall (08/28/22) Left hip Trendelenburg evident in right SLS  10/08/22 5xSTS: 22.87 sec TUG: 13.44 sec with hiking pole, 13.78 sec w/o AD : 16.03 sec with hiking pole, 16.19 sec w/o AD;  Gait speed = 2.05 ft/sec with hiking pole, 2.03 ft/sec w/o AD Berg: 36/56; < 36 high risk for falls (close to 100%)  10/15/22 FGA: 18/30; < 19 = high risk fall   11/05/22 TUG: 11.16 sec - no hiking pole : 14.88 sec -  no hiking pole  Gait speed = 2.20 ft/sec FGA: 22/30 w/o hiking pole; 19-24 = medium risk fall   11/18/22 Berg: 41/56; 37-45 significant risk for falls (>80%)   11/20/22 5xSTS: 10.88 sec  11/26/22 : 16.16 sec w/o AD Gait speed: 2.03 ft/sec FGA: 19/30; 19-24 = medium risk fall   GAIT: Distance walked: 60 Assistive device utilized:  single walking/hiking pole on R Level of assistance: SBA Gait pattern:  increased sway, step through pattern, decreased stride length,  trendelenburg, and lateral hip instability Comments: L>R foot slap   TODAY'S TREATMENT:   12/19/22 THERAPEUTIC EXERCISE: to improve flexibility, strength and mobility.  Verbal and tactile cues throughout for technique.  Rec bike - L4 x 6 min (seat #2)  NEUROMUSCULAR RE-EDUCATION: To improve balance, proprioception, coordination, and reduce fall risk.  All activities performed in hallway with SBA/CGA of PT and intermittent HHA as needed for correction of LOB for all activities as well as intermittent seated rest breaks.  Dynamic gait/stepping: B side stepping - increased difficulty noted with stepping to R B braiding with side stepping - more difficulty but less directional preference as with straight side stepping Visual scanning - walking with horizontal and vertical head turns/nods Velocity changes - changing gait speed - pt with  more difficulty slowing pace than speeding up, although sometimes changes in speed not very pronounced Directional changes - sudden turns, transition from forward gait to side stepping and walking backwards   12/17/22 THERAPEUTIC EXERCISE: to improve flexibility, strength and mobility.  Verbal and tactile cues throughout for technique.  Rec bike - L4 x 6 min (seat #2) Quadruped to tall kneel (All's 4's PWR! Up) x 15 Quadruped alt step to side x 10 Quadruped step to 1/2 tall knee x 5 each side, SBA of PT Quadruped to alt 1/2 tall kneel (All's 4's PWR! Step) x 5 each side, SBA/CGA of PT SLS with RDL reach to mat table x 10 each side, opp hand light support on back of chair Quadruped over peanut ball alt UE raises x 10 Quadruped over peanut ball alt LE raises x 10  NEUROMUSCULAR RE-EDUCATION: To improve balance, proprioception, coordination, and reduce fall risk. L/R SLS + 5-way star tap x 5 cycles with hiking pole for balance   12/12/22 THERAPEUTIC EXERCISE: to improve flexibility, strength and mobility.  Verbal and tactile cues throughout for technique.  UBE: L2.0 x 6 min (3' each fwd & back), emphasis on core engagement L hip hike from floor on 2" step 2 x 10 L/R SLS on 2" step with opp LE short arc hip flex/ext maintaining level pelvis x 10 each L/R SLS on 2" step with opp LE hip ABD with slight extension maintaining level pelvis x 10 each L/R SLS on 2" step with opp LE hip flex/ABD/ext half-circle arc maintaining level pelvis x 10 each Wall squat + hip ADD isometric ball squeeze 10 x 5"  NEUROMUSCULAR RE-EDUCATION: To improve balance, proprioception, coordination, and reduce fall risk. L/R SLS + 5-way star tap x 5 cycles with hiking pole for balance   PATIENT EDUCATION:  Education details: progress with PT and ongoing PT POC Person educated: Patient Education method: Explanation Education comprehension: verbalized understanding  HOME EXERCISE PROGRAM: Access Code: [email or web address] URL:  [email or web address] Date: 11/28/2022 Prepared by: Glenetta Hew  Exercises - Seated Heel Toe Raises  - 1 x daily - 7 x weekly - 2 sets - 10 reps - 3 sec hold - Standing Isometric Hip Abduction with Ball on Wall  - 1 x daily - 7 x weekly - 2 sets - 10 reps - 3 sec hold - Seated Transversus Abdominis Bracing  - 2 x daily - 7 x weekly - 2 sets - 10 reps - 3-5 sec hold - Seated Isometric Hip Abduction with Resistance  - 1 x daily - 3 x weekly - 2 sets - 10 reps - 3 sec hold - Seated March with Resistance  - 1 x daily - 3 x weekly - 2  sets - 10 reps - 3 sec hold - Seated Shoulder Row with Anchored Resistance  - 1 x daily - 3 x weekly - 2 sets - 10 reps - 3-5 hold hold - Seated Single Arm Shoulder Row with Anchored Resistance  - 1 x daily - 3 x weekly - 2 sets - 10 reps - 3 sec hold - Seated Anti-Rotation Press With Anchored Resistance  - 1 x daily - 3 x weekly - 2 sets - 10 reps - 3 sec hold - Tall Kneeling Hip Hinge  - 1 x daily - 3-4 x weekly - 2 sets - 10 reps - 3 sec hold  Quadruped PWR! Up & Step   ASSESSMENT:  CLINICAL IMPRESSION:  Lucianna reports that she was able to increase her walking distance during her neighborhood walks with a friend but still notes difficulty with multitasking while walking including visual scanning to check for traffic.  Today's session focusing on dynamic stepping and gait activities with SBA/CGA of PT and intermittent HHA as needed for correction of LOB as well as intermittent seated rest breaks. She demonstrated more difficulty with side stepping to R vs L but difference less pronounced with crossover stepping during braiding, potentially due to ongoing glute med weakness and tendency for Trendelenburg hip drop. Limited change in velocity noted during changes in gait speed with increased difficulty noted when attempting slower pace. More difficulty noted with visual scanning activities as she tends to rely on visual focal points to help steady herself,  but she is good about picking a distant focal point rather than trying to look down while walking. [place] will benefit from skilled PT to address ongoing strength and balance deficits to improve mobility and activity tolerance with decreased risk for falls.  OBJECTIVE IMPAIRMENTS: Abnormal gait, decreased activity tolerance, decreased balance, decreased coordination, decreased endurance, decreased mobility, difficulty walking, decreased ROM, decreased strength, decreased safety awareness, impaired perceived functional ability, impaired flexibility, improper body mechanics, postural dysfunction, and pain.   ACTIVITY LIMITATIONS: carrying, lifting, bending, sitting, standing, squatting, sleeping, stairs, transfers, bed mobility, bathing, toileting, dressing, reach over head, locomotion level, and caring for others  PARTICIPATION LIMITATIONS: meal prep, cleaning, laundry, interpersonal relationship, driving, shopping, community activity, yard work, and church  PERSONAL FACTORS: Age, Past/current experiences, Time since onset of injury/illness/exacerbation, and 3+ comorbidities: C5-7 ACDF 09/21/20; idiopathic progressive neuropathy; hypthyroidism; DDD; remote h/o back surgery at age 67; h/o scoliosis; Takotsubo cardiomyopathy 01/10/22   are also affecting patient's functional outcome.   REHAB POTENTIAL: Good  CLINICAL DECISION MAKING: Evolving/moderate complexity  EVALUATION COMPLEXITY: Moderate   GOALS: Goals reviewed with patient? Yes  SHORT TERM GOALS: Target date: 09/23/2022  Patient will be independent with initial HEP to improve outcomes and carryover.  Baseline:  Goal status: MET  09/13/22  2.  Complete balance assessment and update LTGs as indicated.  Baseline:  Goal status: MET  08/28/22  LONG TERM GOALS: Target date: 10/21/2022, extended to 12/03/2022, extended to 01/07/2023   Patient will be independent with ongoing/advanced HEP for self-management at home.  Baseline:  Goal status: IN  PROGRESS  11/26/22 - met for current HEP  2.  Patient to demonstrate ability to achieve and maintain good spinal alignment/posturing and body mechanics needed for daily activities. Baseline:  Goal status: MET  10/08/22 - pt demonstrates good awareness of neutral spine alignment; 10/29/22 - refresher indicated in relation to household cleaning tasks  3.  Patient will demonstrate functional pain free lumbar ROM to perform ADLs.  Baseline:  Goal status: PARTIALLY MET  11/26/22 - mostly WFL w/o pain but still mildly limited in extension and R rotation (balance also continues to limit functional ROM)  4.  Patient will demonstrate improved B proximal LE strength to >/= 4 to 4+/5 for improved stability and ease of mobility. Baseline: refer to MMT table above Goal status: IN PROGRESS  11/18/22 - knee strength 5/5, but continued hip weakness esp with fatigue  5  Patient will demonstrate improved B ankle strength to >/= 3+ to 4-/5 for improved gait stability. Baseline: refer to MMT table above Goal status: PARTIALLY MET  11/18/22 - Met for R ankle, although limited endurance with R DF creating foot slap with fatigue, In progress for L ankle  6.  Patient will ambulate with improved gait pattern with decreased B foot drop/slap and no evidence of instability/LOB. Baseline:  Goal status: IN PROGRESS  12/12/22 - Continued intermittent L>R foot slap ~30% of the time when walking, more evident with fatigue, as well as L>R Trendelenburg hip drop.   7. Patient will report </= 32% on Modified Oswestry to demonstrate improved functional ability with decreased pain interference. Baseline: 22 / 50 = 44.0% (eval); 23 / 50 = 46.0% (10/15/22) Goal status: MET  11/05/22:11 / 50 = 22.0 %  8.  Patient will improve Berg score to >/= 36/56 to improve safety and stability with ADLs in standing and reduce risk for falls. Baseline: 27/56 Goal status: MET and REVISED (see 8a.below) 10/08/22 - Berg = 36/56  8a.  Patient will improve  Berg score to >/= 44/56 to improve safety and stability with ADLs in standing and reduce risk for falls. Baseline: 36/56 (10/08/22) Goal status: IN PROGRESS  11/18/22 - Berg = 41/56  9.  Patient will improve FGA score to >/= 19/30 to improve gait stability and reduce risk for falls. Baseline: 8/30 (08/28/22); 18/30 (10/15/22) Goal status: MET  and REVISED (see 9a below) 11/05/22 - FGA = 22/30    9a.  Patient will improve FGA score to >/= 24/30 to improve gait stability and reduce risk for falls. Baseline:  Goal status: IN PROGRESS  11/26/22 - 19/30  10.  Patient will be able to transition floor to/from stand w/o assistance or LOB to allow her complete chores around her home and work in her garden. Baseline:  Goal status: IN PROGRESS  11/28/22    PLAN:  PT FREQUENCY: 2x/week  PT DURATION: 6 weeks  PLANNED INTERVENTIONS: Therapeutic exercises, Therapeutic activity, Neuromuscular re-education, Balance training, Gait training, Patient/Family education, Self Care, Joint mobilization, DME instructions, Aquatic Therapy, Dry Needling, Electrical stimulation, Cryotherapy, Moist heat, scar mobilization, Taping, Ultrasound, Ionotophoresis 4mg /ml Dexamethasone, Manual therapy, and Re-evaluation - [phone] approved for E-stim, but [id number] (E-stim) not approved per [facility] prior authorization  PLAN FOR NEXT SESSION: quadruped and tall kneeling exercises to promote better control and safety with working in the garden; continue core/lumbar and LE strengthening, update HEP as indicated; dynamic stepping, balance training and gait stability, SLS activities; floor transfers   Marry Guan, PT 12/19/2022, 2:49 PM

## 2022-12-24 ENCOUNTER — Encounter: Payer: Self-pay | Admitting: Physical Therapy

## 2022-12-24 ENCOUNTER — Ambulatory Visit: Payer: Medicare PPO | Admitting: Physical Therapy

## 2022-12-24 DIAGNOSIS — M6281 Muscle weakness (generalized): Secondary | ICD-10-CM | POA: Diagnosis not present

## 2022-12-24 DIAGNOSIS — R2681 Unsteadiness on feet: Secondary | ICD-10-CM

## 2022-12-24 DIAGNOSIS — R2689 Other abnormalities of gait and mobility: Secondary | ICD-10-CM

## 2022-12-24 DIAGNOSIS — M5416 Radiculopathy, lumbar region: Secondary | ICD-10-CM

## 2022-12-24 NOTE — Therapy (Signed)
OUTPATIENT PHYSICAL THERAPY TREATMENT     Patient Name: [name] Trujillo MRN: [id number] DOB:08/29/1941, 82 y.o., female Today's Date: 12/24/2022  END OF SESSION:  PT End of Session - 12/24/22 1402     Visit Number 28    Date for PT Re-Evaluation 01/07/23    Authorization Type [employer]    Authorization Time Period 08/27/23 - 01/07/23: 10 additional visits as of 11/28/22, for a total of 34 until 01/07/23    Authorization - Visit Number 28    Authorization - Number of Visits 34    Progress Note Due on Visit 32   Recert/PN on visit #22 (11/26/22)   PT Start Time 1402    PT Stop Time 1446    PT Time Calculation (min) 44 min    Activity Tolerance Patient tolerated treatment well    Behavior During Therapy [facility] for tasks assessed/performed                    Past Medical History:  Diagnosis Date   High cholesterol    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   She had ACUTE TAKOTSUBO (STRESS) CARDIOMYOPATHY with elevated Troponin Levels - this would be considered "Demand Ischemia - Demand Infarction" & NOT associated with ACS/CAD.     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  - Cath with NO CAD. ==> RESOLVED: f/u Echo 03/2022: EF 60-65%.   Past Surgical History:  Procedure Laterality Date   APPENDECTOMY     89-18 yo   BACK SURGERY     Age 24   CESAREAN SECTION     ECTOPIC PREGNANCY SURGERY     LAPAROSCOPIC HYSTERECTOMY     LEFT HEART CATH AND CORONARY ANGIOGRAPHY N/A 01/09/2022   Procedure: LEFT HEART CATH AND CORONARY ANGIOGRAPHY;  Surgeon: Tonny Bollman, MD;  Location: [facility] INVASIVE CV LAB;  Service: CV:: Widely patent coronaries with mild nonobstructive LAD plaquing.   Right dominant system.  Normal LVEDP.  Based on clinical presentation, findings are consistent with acute Takotsubo Cardiomyopathy Syndrome   POSTERIOR FUSION LUMBAR SPINE  05/24/2022   [facility], [place]; [name], MD): L2-L3 XLIF, L2-L3 POSTERIOR DECOMPRESSION AND FUSION   TRANSTHORACIC ECHOCARDIOGRAM  01/08/2022   Severely decreased LV function-EF 25 to 30%.  Mid to apical (mostly anterior) with normal basal motion.  GR 2 DD-elevated LAP.  Mildly dilated LA.  Aortic sclerosis with no stenosis.  No AI.  Normal MV with mild to moderate TR.  Mildly elevated RAP, and PAP (estimated 49 mg). If LAD CAD ruled out - consistent with Takotsubo CM Syndrome.   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    PCP: Mosetta Putt, MD   REFERRING PROVIDER:  Rondel Oh, NP for Marlaine Hind, MD   REFERRING DIAG: Z98.1 (ICD-10-CM) - S/P lumbar spinal fusion   THERAPY DIAG:  Muscle weakness (generalized)  Radiculopathy, lumbar region  Other abnormalities of gait and mobility  Unsteadiness on feet  RATIONALE FOR EVALUATION AND TREATMENT: Rehabilitation  ONSET DATE: 05/24/2022 - L2-3 XLIF/PSF   NEXT MD VISIT: September 2024    SUBJECTIVE:                                                                                                                                                                                                         SUBJECTIVE STATEMENT:  Pt reports she is  using her skills she has learned in PT with cleaning in her home. She still has difficulty with the laundry, esp taking things out of the dryer.  PAIN: Are you having pain? No  PERTINENT HISTORY:  L2-3 XLIF/PSF 05/24/22; C5-7 ACDF 09/21/20; idiopathic progressive neuropathy; hypthyroidism; DDD; remote h/o back surgery at age 12; h/o scoliosis; Takotsubo cardiomyopathy 01/10/22   PRECAUTIONS: Back - No BLT, TLSO discontinued as of 09/11/22  WEIGHT BEARING RESTRICTIONS: No  FALLS:  Has patient fallen in last 6 months? No  LIVING ENVIRONMENT: Lives with: lives with their spouse Lives in: House/apartment Stairs: Yes: External: 5-6 steps; on left going up Has following equipment at home: Walker - 2 wheeled, shower chair, and hiking/walking poles  OCCUPATION: Retired Runner, broadcasting/film/video  PLOF: Independent and Leisure: walking daily for total of ~1.5 miles; working in the yard; sewing/quilting    PATIENT GOALS: "Be able to move and do things in the house and yard w/o worrying about falling."   OBJECTIVE:   DIAGNOSTIC FINDINGS:  08/27/22 - Lumbar spine x-ray completed as part of postop series - results pending   07/01/22 - Lumbar spine x-ray: Expected postoperative appearance related to L2-L3 fixation.   06/12/21 - Lumbar MRI: Postoperative and degenerative changes of the lumbar spine as described, similar compared to July 28, 2020. At L2-L3, there is moderate spinal canal stenosis due to disc bulging, endplate osteophytic spurring, facet and ligamentum flavum hypertrophy. Milder spinal canal stenosis is seen at L3-L4. There is multilevel neural foraminal stenosis.   PATIENT SURVEYS:  Modified Oswestry 22 / 50 = 44.0%, 23 / 50 = 46.0% (10/15/22)   SCREENING FOR RED FLAGS: Bowel or bladder incontinence: Yes: bladder - takes Vesicare Spinal tumors: No Cauda equina syndrome: No Compression fracture: No Abdominal aneurysm: No  COGNITION:  Overall cognitive status: Within functional limits for  tasks assessed    SENSATION: WFL - Pt reports  LE sensation has improved since surgery  MUSCLE LENGTH: NT  POSTURE:  rounded shoulders, forward head, and right pelvic obliquity  PALPATION: NT  LUMBAR ROM: Deferred on eval pending MD clearance to release her from post-surgical precautions   Active  AROM  10/08/22 * AROM  11/05/22 AROM 11/26/22 *  Flexion Hands to mid shin Hands to mid shin  Hands to toes with slight knee flexion   Extension 50% limited Limited 25%  Limited 25%  Right lateral flexion Hand to lateral knee Hand to lateral knee Hand to just below knee  Left lateral flexion Hand to lateral knee Hand to lateral knee Hand to just below knee  Right rotation 50% limited Limited due to balance  25% limited  Left rotation 50% limited WNL WNL  (Blank rows = not tested)  (* - ROM assessment limited in standing due to balance deficits)  LOWER EXTREMITY ROM:    Grossly WFL  LOWER EXTREMITY MMT:  (tested in sitting on eval, traditional testing positions for all subsequent testing)  MMT Right  eval Left    eval Right 10/08/22 Left 10/08/22 Right 11/05/22 Left 11/05/22 Right 11/18/22 Left 11/18/22  Hip flexion 4 4 4 4 4 4 4 4   Hip extension 4- 4- 4- 3- 4+ 4+ 4+ 4+  Hip abduction 4+ 4+ 4+ 4 4+ 4+ 4+ 4+  Hip adduction 4+ 4+ 3+ 4 3+ 4 4- 4  Hip internal rotation 4- 4- 4 4 4 4  4+ 4+  Hip external rotation 3+ 3+ 4- 3+ 4 4 4+ 4  Knee flexion 4+ 4+ 5 5 5 5 5 5   Knee extension 4+ 4+ 4+ 4+ 4+ 4+ 5 5  Ankle dorsiflexion 3- 3- 3 3- 3 3 3+ 3  Ankle plantarflexion 4- NWB 4- NWB 4+ NWB 4 NWB 4+ NWB 4+ NWB 4+ NWB 4+ NWB  Ankle inversion 3- 2- 3+ 2 4 2+ 4 2+  Ankle eversion 4- 3+ 4 3+ 4 4 4 4    (Blank rows = not tested)  LUMBAR SPECIAL TESTS:  NT  FUNCTIONAL TESTS:  5 times sit to stand: 22.19 sec  Timed up and go (TUG): 14.84 sec with hiking pole (08/28/22) 10 meter walk test: 15.47 sec with hiking pole, Gait speed = 2.12 ft/sec (08/28/22) Berg Balance Scale: 27/56; < 36 high risk for  falls (close to 100%) Functional gait assessment: 8/30; < 19 = high risk fall (08/28/22) Left hip Trendelenburg evident in right SLS  10/08/22 5xSTS: 22.87 sec TUG: 13.44 sec with hiking pole, 13.78 sec w/o AD : 16.03 sec with hiking pole, 16.19 sec w/o AD;  Gait speed = 2.05 ft/sec with hiking pole, 2.03 ft/sec w/o AD Berg: 36/56; < 36 high risk for falls (close to 100%)  10/15/22 FGA: 18/30; < 19 = high risk fall   11/05/22 TUG: 11.16 sec - no hiking pole : 14.88 sec -  no hiking pole  Gait speed = 2.20 ft/sec FGA: 22/30 w/o hiking pole; 19-24 = medium risk fall   11/18/22 Berg: 41/56; 37-45 significant risk for falls (>80%)   11/20/22 5xSTS: 10.88 sec  11/26/22 : 16.16 sec w/o AD Gait speed: 2.03 ft/sec FGA: 19/30; 19-24 = medium risk fall   GAIT: Distance walked: 60 Assistive device utilized:  single walking/hiking pole on R Level of assistance: SBA Gait pattern:  increased sway, step through pattern, decreased stride length, trendelenburg, and lateral hip instability Comments: L>R foot slap   TODAY'S TREATMENT:   12/24/22 THERAPEUTIC  EXERCISE: to improve flexibility, strength and mobility.  Verbal and tactile cues throughout for technique.  Rec bike - L4 x 6 min (seat #2) Counter squat with gradually increasing depth of squat x 10  THERAPEUTIC ACTIVITIES: Simulated laundry related tasks and provided options to put her at a better height for taking things out of the dryer - tall knee, 1/2 kneel vs sitting on stool Simulated STS from lower surface such as stool for above (SBA of PT): STS from 14" box x 10 STS 9" stool + Airex pad (12" total height) x 10 Discussed best options for working in the garden, using stool vs kneeling w/ or w/o kneeler   12/19/22 THERAPEUTIC EXERCISE: to improve flexibility, strength and mobility.  Verbal and tactile cues throughout for technique.  Rec bike - L4 x 6 min (seat #2)  NEUROMUSCULAR RE-EDUCATION: To improve  balance, proprioception, coordination, and reduce fall risk.  All activities performed in hallway with SBA/CGA of PT and intermittent HHA as needed for correction of LOB for all activities as well as intermittent seated rest breaks.  Dynamic gait/stepping: B side stepping - increased difficulty noted with stepping to R B braiding with side stepping - more difficulty but less directional preference as with straight side stepping Visual scanning - walking with horizontal and vertical head turns/nods Velocity changes - changing gait speed - pt with more difficulty slowing pace than speeding up, although sometimes changes in speed not very pronounced Directional changes - sudden turns, transition from forward gait to side stepping and walking backwards   12/17/22 THERAPEUTIC EXERCISE: to improve flexibility, strength and mobility.  Verbal and tactile cues throughout for technique.  Rec bike - L4 x 6 min (seat #2) Quadruped to tall kneel (All's 4's PWR! Up) x 15 Quadruped alt step to side x 10 Quadruped step to 1/2 tall knee x 5 each side, SBA of PT Quadruped to alt 1/2 tall kneel (All's 4's PWR! Step) x 5 each side, SBA/CGA of PT SLS with RDL reach to mat table x 10 each side, opp hand light support on back of chair Quadruped over peanut ball alt UE raises x 10 Quadruped over peanut ball alt LE raises x 10  NEUROMUSCULAR RE-EDUCATION: To improve balance, proprioception, coordination, and reduce fall risk. L/R SLS + 5-way star tap x 5 cycles with hiking pole for balance   PATIENT EDUCATION:  Education details: progress with PT and ongoing PT POC Person educated: Patient Education method: Explanation Education comprehension: verbalized understanding  HOME EXERCISE PROGRAM: Access Code: [email or web address] URL: [email or web address] Date: 11/28/2022 Prepared by: Glenetta Hew  Exercises - Seated Heel Toe Raises  - 1 x daily - 7 x weekly - 2 sets - 10 reps - 3 sec hold - Standing  Isometric Hip Abduction with Ball on Wall  - 1 x daily - 7 x weekly - 2 sets - 10 reps - 3 sec hold - Seated Transversus Abdominis Bracing  - 2 x daily - 7 x weekly - 2 sets - 10 reps - 3-5 sec hold - Seated Isometric Hip Abduction with Resistance  - 1 x daily - 3 x weekly - 2 sets - 10 reps - 3 sec hold - Seated March with Resistance  - 1 x daily - 3 x weekly - 2 sets - 10 reps - 3 sec hold - Seated Shoulder Row with Anchored Resistance  - 1 x daily - 3 x weekly - 2 sets - 10 reps - 3-5 hold hold - Seated  Single Arm Shoulder Row with Anchored Resistance  - 1 x daily - 3 x weekly - 2 sets - 10 reps - 3 sec hold - Seated Anti-Rotation Press With Anchored Resistance  - 1 x daily - 3 x weekly - 2 sets - 10 reps - 3 sec hold - Tall Kneeling Hip Hinge  - 1 x daily - 3-4 x weekly - 2 sets - 10 reps - 3 sec hold  Quadruped PWR! Up & Step   ASSESSMENT:  CLINICAL IMPRESSION:  Seeley reports that she was able to utilize some of the posture and body mechanics training as well as therapeutic activities and exercises from PT while cleaning her bathroom at home, and feels more confident with such tasks.  She continues to note difficulty with tasks such as laundry especially removing laundry from the dryer, therefore discussed and demonstrated options to have her more level with the opening of the dryer - patient feeling like sitting on a stool would be the best option for her, therefore worked on sit to stand from low surface to ensure she would be able to safely transition on and off the stool.  She also continues to express concern about being able to work in her garden this year, therefore revisited recent therapeutic activities geared towards this task as well as suggestions for seating/support while working in the garden (convertible kneeling pad/bench).  We are nearing the end of her current extension of her POC and she expresses interest in planning to transition to HEP at that time, therefore will plan to  help consolidate and prioritize her HEP as well as continue to address functional mobility and household tasks concerns in her remaining visits.  OBJECTIVE IMPAIRMENTS: Abnormal gait, decreased activity tolerance, decreased balance, decreased coordination, decreased endurance, decreased mobility, difficulty walking, decreased ROM, decreased strength, decreased safety awareness, impaired perceived functional ability, impaired flexibility, improper body mechanics, postural dysfunction, and pain.   ACTIVITY LIMITATIONS: carrying, lifting, bending, sitting, standing, squatting, sleeping, stairs, transfers, bed mobility, bathing, toileting, dressing, reach over head, locomotion level, and caring for others  PARTICIPATION LIMITATIONS: meal prep, cleaning, laundry, interpersonal relationship, driving, shopping, community activity, yard work, and church  PERSONAL FACTORS: Age, Past/current experiences, Time since onset of injury/illness/exacerbation, and 3+ comorbidities: C5-7 ACDF 09/21/20; idiopathic progressive neuropathy; hypthyroidism; DDD; remote h/o back surgery at age 36; h/o scoliosis; Takotsubo cardiomyopathy 01/10/22   are also affecting patient's functional outcome.   REHAB POTENTIAL: Good  CLINICAL DECISION MAKING: Evolving/moderate complexity  EVALUATION COMPLEXITY: Moderate   GOALS: Goals reviewed with patient? Yes  SHORT TERM GOALS: Target date: 09/23/2022  Patient will be independent with initial HEP to improve outcomes and carryover.  Baseline:  Goal status: MET  09/13/22  2.  Complete balance assessment and update LTGs as indicated.  Baseline:  Goal status: MET  08/28/22  LONG TERM GOALS: Target date: 10/21/2022, extended to 12/03/2022, extended to 01/07/2023   Patient will be independent with ongoing/advanced HEP for self-management at home.  Baseline:  Goal status: IN PROGRESS  11/26/22 - met for current HEP  2.  Patient to demonstrate ability to achieve and maintain good  spinal alignment/posturing and body mechanics needed for daily activities. Baseline:  Goal status: MET  10/08/22 - pt demonstrates good awareness of neutral spine alignment; 10/29/22 - refresher indicated in relation to household cleaning tasks  3.  Patient will demonstrate functional pain free lumbar ROM to perform ADLs.   Baseline:  Goal status: PARTIALLY MET  11/26/22 - mostly [facility]  w/o pain but still mildly limited in extension and R rotation (balance also continues to limit functional ROM)  4.  Patient will demonstrate improved B proximal LE strength to >/= 4 to 4+/5 for improved stability and ease of mobility. Baseline: refer to MMT table above Goal status: IN PROGRESS  11/18/22 - knee strength 5/5, but continued hip weakness esp with fatigue  5  Patient will demonstrate improved B ankle strength to >/= 3+ to 4-/5 for improved gait stability. Baseline: refer to MMT table above Goal status: PARTIALLY MET  11/18/22 - Met for R ankle, although limited endurance with R DF creating foot slap with fatigue, In progress for L ankle  6.  Patient will ambulate with improved gait pattern with decreased B foot drop/slap and no evidence of instability/LOB. Baseline:  Goal status: IN PROGRESS  12/12/22 - Continued intermittent L>R foot slap ~30% of the time when walking, more evident with fatigue, as well as L>R Trendelenburg hip drop.   7. Patient will report </= 32% on Modified Oswestry to demonstrate improved functional ability with decreased pain interference. Baseline: 22 / 50 = 44.0% (eval); 23 / 50 = 46.0% (10/15/22) Goal status: MET  11/05/22:11 / 50 = 22.0 %  8.  Patient will improve Berg score to >/= 36/56 to improve safety and stability with ADLs in standing and reduce risk for falls. Baseline: 27/56 Goal status: MET and REVISED (see 8a.below) 10/08/22 - Berg = 36/56  8a.  Patient will improve Berg score to >/= 44/56 to improve safety and stability with ADLs in standing and reduce risk for  falls. Baseline: 36/56 (10/08/22) Goal status: IN PROGRESS  11/18/22 - Berg = 41/56  9.  Patient will improve FGA score to >/= 19/30 to improve gait stability and reduce risk for falls. Baseline: 8/30 (08/28/22); 18/30 (10/15/22) Goal status: MET  and REVISED (see 9a below) 11/05/22 - FGA = 22/30    9a.  Patient will improve FGA score to >/= 24/30 to improve gait stability and reduce risk for falls. Baseline:  Goal status: IN PROGRESS  11/26/22 - 19/30  10.  Patient will be able to transition floor to/from stand w/o assistance or LOB to allow her complete chores around her home and work in her garden. Baseline:  Goal status: IN PROGRESS  11/28/22    PLAN:  PT FREQUENCY: 2x/week  PT DURATION: 6 weeks  PLANNED INTERVENTIONS: Therapeutic exercises, Therapeutic activity, Neuromuscular re-education, Balance training, Gait training, Patient/Family education, Self Care, Joint mobilization, DME instructions, Aquatic Therapy, Dry Needling, Electrical stimulation, Cryotherapy, Moist heat, scar mobilization, Taping, Ultrasound, Ionotophoresis 4mg /ml Dexamethasone, Manual therapy, and Re-evaluation - [phone] approved for E-stim, but G0283 (E-stim) not approved per [facility] prior authorization  PLAN FOR NEXT SESSION: quadruped and tall kneeling exercises to promote better control and safety with working in the garden; STS from low surfaces and floor transfers to improve safety with activities requiring her to get down to the ground or low heights; continue core/lumbar and LE strengthening; dynamic stepping, balance training and gait stability, SLS activities; review/update/consolidate HEP as indicated    Marry Guan, PT 12/24/2022, 8:09 PM

## 2022-12-26 ENCOUNTER — Ambulatory Visit: Payer: Medicare PPO | Admitting: Physical Therapy

## 2022-12-26 ENCOUNTER — Encounter: Payer: Self-pay | Admitting: Physical Therapy

## 2022-12-26 DIAGNOSIS — R2689 Other abnormalities of gait and mobility: Secondary | ICD-10-CM

## 2022-12-26 DIAGNOSIS — M6281 Muscle weakness (generalized): Secondary | ICD-10-CM | POA: Diagnosis not present

## 2022-12-26 DIAGNOSIS — M5416 Radiculopathy, lumbar region: Secondary | ICD-10-CM

## 2022-12-26 DIAGNOSIS — R2681 Unsteadiness on feet: Secondary | ICD-10-CM

## 2022-12-26 NOTE — Therapy (Signed)
OUTPATIENT PHYSICAL THERAPY TREATMENT     Patient Name: Oluwadara Gorman MRN: [id number] DOB:May 20, 1941, 82 y.o., female Today's Date: 12/26/2022  END OF SESSION:  PT End of Session - 12/26/22 1445     Visit Number 29    Date for PT Re-Evaluation 01/07/23    Authorization Type [employer]    Authorization Time Period 08/27/23 - 01/07/23: 10 additional visits as of 11/28/22, for a total of 34 until 01/07/23    Authorization - Visit Number 29    Authorization - Number of Visits 34    Progress Note Due on Visit 32   Recert/PN on visit #22 (11/26/22)   PT Start Time 1445    PT Stop Time 1530    PT Time Calculation (min) 45 min    Activity Tolerance Patient tolerated treatment well    Behavior During Therapy [facility] for tasks assessed/performed                    Past Medical History:  Diagnosis Date   High cholesterol    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   She had ACUTE TAKOTSUBO (STRESS) CARDIOMYOPATHY with elevated Troponin Levels - this would be considered "Demand Ischemia - Demand Infarction" & NOT associated with ACS/CAD.     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  - Cath with NO CAD. ==> RESOLVED: f/u Echo 03/2022: EF 60-65%.   Past Surgical History:  Procedure Laterality Date   APPENDECTOMY     37-18 yo   BACK SURGERY     Age 3   CESAREAN SECTION     ECTOPIC PREGNANCY SURGERY     LAPAROSCOPIC HYSTERECTOMY     LEFT HEART CATH AND CORONARY ANGIOGRAPHY N/A 01/09/2022   Procedure: LEFT HEART CATH AND CORONARY ANGIOGRAPHY;  Surgeon: Tonny Bollman, MD;  Location: [facility] INVASIVE CV LAB;  Service: CV:: Widely patent coronaries with mild nonobstructive LAD plaquing.   Right dominant system.  Normal LVEDP.  Based on clinical presentation, findings are consistent with acute Takotsubo Cardiomyopathy Syndrome   POSTERIOR FUSION LUMBAR SPINE  05/24/2022   [facility], [place]; [name], MD): L2-L3 XLIF, L2-L3 POSTERIOR DECOMPRESSION AND FUSION   TRANSTHORACIC ECHOCARDIOGRAM  01/08/2022   Severely decreased LV function-EF 25 to 30%.  Mid to apical (mostly anterior) with normal basal motion.  GR 2 DD-elevated LAP.  Mildly dilated LA.  Aortic sclerosis with no stenosis.  No AI.  Normal MV with mild to moderate TR.  Mildly elevated RAP, and PAP (estimated 49 mg). If LAD CAD ruled out - consistent with Takotsubo CM Syndrome.   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    PCP: Mosetta Putt, MD   REFERRING PROVIDER:  Rondel Oh, NP for Marlaine Hind, MD   REFERRING DIAG: Z98.1 (ICD-10-CM) - S/P lumbar spinal fusion   THERAPY DIAG:  Muscle weakness (generalized)  Radiculopathy, lumbar region  Other abnormalities of gait and mobility  Unsteadiness on feet  RATIONALE FOR EVALUATION AND TREATMENT: Rehabilitation  ONSET DATE: 05/24/2022 - L2-3 XLIF/PSF   NEXT MD VISIT: September 2024    SUBJECTIVE:                                                                                                                                                                                                         SUBJECTIVE STATEMENT:  Pt reports she  utilized her stool with handle at home as a seat to do some cleaning of lower surfaces in her bedroom and notes some fatigue from this but otherwise no issues.  PAIN: Are you having pain? No  PERTINENT HISTORY:  L2-3 XLIF/PSF 05/24/22; C5-7 ACDF 09/21/20; idiopathic progressive neuropathy; hypthyroidism; DDD; remote h/o back surgery at age 82; h/o scoliosis; Takotsubo cardiomyopathy 01/10/22   PRECAUTIONS: Back - No BLT, TLSO discontinued as of 09/11/22  WEIGHT BEARING RESTRICTIONS: No  FALLS:  Has patient fallen in last 6 months? No  LIVING ENVIRONMENT: Lives with: lives with their spouse Lives in: House/apartment Stairs: Yes: External: 5-6 steps; on left going up Has following equipment at home: Walker - 2 wheeled, shower chair, and hiking/walking poles  OCCUPATION: Retired Runner, broadcasting/film/video  PLOF: Independent and Leisure: walking daily for total of ~1.5 miles; working in the yard; sewing/quilting    PATIENT GOALS: "Be able to move and do things in the house and yard w/o worrying about falling."   OBJECTIVE:   DIAGNOSTIC FINDINGS:  08/27/22 - Lumbar spine x-ray completed as part of postop series - results pending   07/01/22 - Lumbar spine x-ray: Expected postoperative appearance related to L2-L3 fixation.   06/12/21 - Lumbar MRI: Postoperative and degenerative changes of the lumbar spine as described, similar compared to July 28, 2020. At L2-L3, there is moderate spinal canal stenosis due to disc bulging, endplate osteophytic spurring, facet and ligamentum flavum hypertrophy. Milder spinal canal stenosis is seen at L3-L4. There is multilevel neural foraminal stenosis.   PATIENT SURVEYS:  Modified Oswestry 22 / 50 = 44.0%, 23 / 50 = 46.0% (10/15/22)   SCREENING FOR RED FLAGS: Bowel or bladder incontinence: Yes: bladder - takes Vesicare Spinal tumors: No Cauda equina syndrome: No Compression fracture: No Abdominal aneurysm: No  COGNITION:  Overall cognitive status: Within functional  limits for tasks assessed    SENSATION: [facility] -  Pt reports LE sensation has improved since surgery  MUSCLE LENGTH: NT  POSTURE:  rounded shoulders, forward head, and right pelvic obliquity  PALPATION: NT  LUMBAR ROM: Deferred on eval pending MD clearance to release her from post-surgical precautions   Active  AROM  10/08/22 * AROM  11/05/22 AROM 11/26/22 *  Flexion Hands to mid shin Hands to mid shin  Hands to toes with slight knee flexion   Extension 50% limited Limited 25%  Limited 25%  Right lateral flexion Hand to lateral knee Hand to lateral knee Hand to just below knee  Left lateral flexion Hand to lateral knee Hand to lateral knee Hand to just below knee  Right rotation 50% limited Limited due to balance  25% limited  Left rotation 50% limited WNL WNL  (Blank rows = not tested)  (* - ROM assessment limited in standing due to balance deficits)  LOWER EXTREMITY ROM:    Grossly WFL  LOWER EXTREMITY MMT:  (tested in sitting on eval, traditional testing positions for all subsequent testing)  MMT Right  eval Left    eval Right 10/08/22 Left 10/08/22 Right 11/05/22 Left 11/05/22 Right 11/18/22 Left 11/18/22  Hip flexion 4 4 4 4 4 4 4 4   Hip extension 4- 4- 4- 3- 4+ 4+ 4+ 4+  Hip abduction 4+ 4+ 4+ 4 4+ 4+ 4+ 4+  Hip adduction 4+ 4+ 3+ 4 3+ 4 4- 4  Hip internal rotation 4- 4- 4 4 4 4  4+ 4+  Hip external rotation 3+ 3+ 4- 3+ 4 4 4+ 4  Knee flexion 4+ 4+ 5 5 5 5 5 5   Knee extension 4+ 4+ 4+ 4+ 4+ 4+ 5 5  Ankle dorsiflexion 3- 3- 3 3- 3 3 3+ 3  Ankle plantarflexion 4- NWB 4- NWB 4+ NWB 4 NWB 4+ NWB 4+ NWB 4+ NWB 4+ NWB  Ankle inversion 3- 2- 3+ 2 4 2+ 4 2+  Ankle eversion 4- 3+ 4 3+ 4 4 4 4    (Blank rows = not tested)  LUMBAR SPECIAL TESTS:  NT  FUNCTIONAL TESTS:  5 times sit to stand: 22.19 sec  Timed up and go (TUG): 14.84 sec with hiking pole (08/28/22) 10 meter walk test: 15.47 sec with hiking pole, Gait speed = 2.12 ft/sec (08/28/22) Berg Balance Scale: 27/56; < 36 high  risk for falls (close to 100%) Functional gait assessment: 8/30; < 19 = high risk fall (08/28/22) Left hip Trendelenburg evident in right SLS  10/08/22 5xSTS: 22.87 sec TUG: 13.44 sec with hiking pole, 13.78 sec w/o AD : 16.03 sec with hiking pole, 16.19 sec w/o AD;  Gait speed = 2.05 ft/sec with hiking pole, 2.03 ft/sec w/o AD Berg: 36/56; < 36 high risk for falls (close to 100%)  10/15/22 FGA: 18/30; < 19 = high risk fall   11/05/22 TUG: 11.16 sec - no hiking pole : 14.88 sec -  no hiking pole  Gait speed = 2.20 ft/sec FGA: 22/30 w/o hiking pole; 19-24 = medium risk fall   11/18/22 Berg: 41/56; 37-45 significant risk for falls (>80%)   11/20/22 5xSTS: 10.88 sec  11/26/22 : 16.16 sec w/o AD Gait speed: 2.03 ft/sec FGA: 19/30; 19-24 = medium risk fall   GAIT: Distance walked: 60 Assistive device utilized:  single walking/hiking pole on R Level of assistance: SBA Gait pattern:  increased sway, step through pattern, decreased stride length, trendelenburg, and lateral hip instability Comments: L>R foot slap   TODAY'S TREATMENT:  12/26/22 THERAPEUTIC EXERCISE: to improve flexibility, strength and mobility.  Verbal and tactile cues throughout for technique.  Rec bike - L4 x 6 min (seat #2) Verbally reviewed HEP with patient describing which exercises she has been focusing on and PT providing guidance on areas needing further attention - HEP handout updated reflect consensus on HEP exercises/activities  THERAPEUTIC ACTIVITIES: Demonstrated floor to stand transfers without external upper extremity support pressing up with hands on elevated knee and opposite hand pushing from floor versus using her hiking pole.  NEUROMUSCULAR RE-EDUCATION: To improve balance, proprioception, coordination, and reduce fall risk. Corner balance progression with narrow BOS on Airex pad with intermittent UE support on back of chair            Eyes open: - static stance x 30 sec - horiz  head turns x 5 - vertical head nods x 5 - trunk rotation x 5   12/24/22 THERAPEUTIC EXERCISE: to improve flexibility, strength and mobility.  Verbal and tactile cues throughout for technique.  Rec bike - L4 x 6 min (seat #2) Counter squat with gradually increasing depth of squat x 10  THERAPEUTIC ACTIVITIES: Simulated laundry related tasks and provided options to put her at a better height for taking things out of the dryer - tall knee, 1/2 kneel vs sitting on stool Simulated STS from lower surface such as stool for above (SBA of PT): STS from 14" box x 10 STS 9" stool + Airex pad (12" total height) x 10 Discussed best options for working in the garden, using stool vs kneeling w/ or w/o kneeler   12/19/22 THERAPEUTIC EXERCISE: to improve flexibility, strength and mobility.  Verbal and tactile cues throughout for technique.  Rec bike - L4 x 6 min (seat #2)  NEUROMUSCULAR RE-EDUCATION: To improve balance, proprioception, coordination, and reduce fall risk.  All activities performed in hallway with SBA/CGA of PT and intermittent HHA as needed for correction of LOB for all activities as well as intermittent seated rest breaks.  Dynamic gait/stepping: B side stepping - increased difficulty noted with stepping to R B braiding with side stepping - more difficulty but less directional preference as with straight side stepping Visual scanning - walking with horizontal and vertical head turns/nods Velocity changes - changing gait speed - pt with more difficulty slowing pace than speeding up, although sometimes changes in speed not very pronounced Directional changes - sudden turns, transition from forward gait to side stepping and walking backwards   PATIENT EDUCATION:  Education details: progress with PT and ongoing PT POC Person educated: Patient Education method: Explanation Education comprehension: verbalized understanding  HOME EXERCISE PROGRAM: Access Code: [email or web address] URL:  [email or web address] Date: 11/28/2022 Prepared by: Glenetta Hew  Exercises - Seated Heel Toe Raises  - 1 x daily - 7 x weekly - 2 sets - 10 reps - 3 sec hold - Standing Isometric Hip Abduction with Ball on Wall  - 1 x daily - 7 x weekly - 2 sets - 10 reps - 3 sec hold - Seated Transversus Abdominis Bracing  - 2 x daily - 7 x weekly - 2 sets - 10 reps - 3-5 sec hold - Seated Isometric Hip Abduction with Resistance  - 1 x daily - 3 x weekly - 2 sets - 10 reps - 3 sec hold - Seated March with Resistance  - 1 x daily - 3 x weekly - 2 sets - 10 reps - 3 sec hold - Seated Shoulder Row with Anchored Resistance  -  1 x daily - 3 x weekly - 2 sets - 10 reps - 3-5 hold hold - Seated Single Arm Shoulder Row with Anchored Resistance  - 1 x daily - 3 x weekly - 2 sets - 10 reps - 3 sec hold - Seated Anti-Rotation Press With Anchored Resistance  - 1 x daily - 3 x weekly - 2 sets - 10 reps - 3 sec hold - Tall Kneeling Hip Hinge  - 1 x daily - 3-4 x weekly - 2 sets - 10 reps - 3 sec hold  Quadruped PWR! Up & Step   ASSESSMENT:  CLINICAL IMPRESSION:  Faylynn reports that she was able to do some long overdue cleaning of the lower surfaces in her home utilizing her stool with handle as a perch to sit on so that she would not need to bend over or squat without support - she reports this was very effective and was pleased with what she was able to accomplish noting only mild muscle soreness in her back following the activity.  She arrives to PT with a list of her areas of concern related to functional tasks as well as priorities that she currently focuses on with her HEP.  One of her concerns was related to how she would get up from the floor/ground when there is not furniture nearby, therefore provided instruction/demonstration in floor to stand transfer without external support as well as using her hiking pole.  Remainder of session spent reviewing her current preference for HEP and limited in some of  the earlier exercises and providing updated versions to reflect her current functional level as well as continued areas of concern related to weakness and functional limitations.  Will review these updates next visit and address any concerns identified, as well as continue to address functional mobility and household tasks concerns in her remaining visits.  OBJECTIVE IMPAIRMENTS: Abnormal gait, decreased activity tolerance, decreased balance, decreased coordination, decreased endurance, decreased mobility, difficulty walking, decreased ROM, decreased strength, decreased safety awareness, impaired perceived functional ability, impaired flexibility, improper body mechanics, postural dysfunction, and pain.   ACTIVITY LIMITATIONS: carrying, lifting, bending, sitting, standing, squatting, sleeping, stairs, transfers, bed mobility, bathing, toileting, dressing, reach over head, locomotion level, and caring for others  PARTICIPATION LIMITATIONS: meal prep, cleaning, laundry, interpersonal relationship, driving, shopping, community activity, yard work, and church  PERSONAL FACTORS: Age, Past/current experiences, Time since onset of injury/illness/exacerbation, and 3+ comorbidities: C5-7 ACDF 09/21/20; idiopathic progressive neuropathy; hypthyroidism; DDD; remote h/o back surgery at age 43; h/o scoliosis; Takotsubo cardiomyopathy 01/10/22   are also affecting patient's functional outcome.   REHAB POTENTIAL: Good  CLINICAL DECISION MAKING: Evolving/moderate complexity  EVALUATION COMPLEXITY: Moderate   GOALS: Goals reviewed with patient? Yes  SHORT TERM GOALS: Target date: 09/23/2022  Patient will be independent with initial HEP to improve outcomes and carryover.  Baseline:  Goal status: MET  09/13/22  2.  Complete balance assessment and update LTGs as indicated.  Baseline:  Goal status: MET  08/28/22  LONG TERM GOALS: Target date: 10/21/2022, extended to 12/03/2022, extended to 01/07/2023   Patient  will be independent with ongoing/advanced HEP for self-management at home.  Baseline:  Goal status: IN PROGRESS  11/26/22 - met for current HEP  2.  Patient to demonstrate ability to achieve and maintain good spinal alignment/posturing and body mechanics needed for daily activities. Baseline:  Goal status: MET  10/08/22 - pt demonstrates good awareness of neutral spine alignment; 10/29/22 - refresher indicated in relation to household cleaning  tasks  3.  Patient will demonstrate functional pain free lumbar ROM to perform ADLs.   Baseline:  Goal status: PARTIALLY MET  11/26/22 - mostly WFL w/o pain but still mildly limited in extension and R rotation (balance also continues to limit functional ROM)  4.  Patient will demonstrate improved B proximal LE strength to >/= 4 to 4+/5 for improved stability and ease of mobility. Baseline: refer to MMT table above Goal status: IN PROGRESS  11/18/22 - knee strength 5/5, but continued hip weakness esp with fatigue  5  Patient will demonstrate improved B ankle strength to >/= 3+ to 4-/5 for improved gait stability. Baseline: refer to MMT table above Goal status: PARTIALLY MET  11/18/22 - Met for R ankle, although limited endurance with R DF creating foot slap with fatigue, In progress for L ankle  6.  Patient will ambulate with improved gait pattern with decreased B foot drop/slap and no evidence of instability/LOB. Baseline:  Goal status: IN PROGRESS  12/12/22 - Continued intermittent L>R foot slap ~30% of the time when walking, more evident with fatigue, as well as L>R Trendelenburg hip drop.   7. Patient will report </= 32% on Modified Oswestry to demonstrate improved functional ability with decreased pain interference. Baseline: 22 / 50 = 44.0% (eval); 23 / 50 = 46.0% (10/15/22) Goal status: MET  11/05/22:11 / 50 = 22.0 %  8.  Patient will improve Berg score to >/= 36/56 to improve safety and stability with ADLs in standing and reduce risk for  falls. Baseline: 27/56 Goal status: MET and REVISED (see 8a.below) 10/08/22 - Berg = 36/56  8a.  Patient will improve Berg score to >/= 44/56 to improve safety and stability with ADLs in standing and reduce risk for falls. Baseline: 36/56 (10/08/22) Goal status: IN PROGRESS  11/18/22 - Berg = 41/56  9.  Patient will improve FGA score to >/= 19/30 to improve gait stability and reduce risk for falls. Baseline: 8/30 (08/28/22); 18/30 (10/15/22) Goal status: MET  and REVISED (see 9a below) 11/05/22 - FGA = 22/30    9a.  Patient will improve FGA score to >/= 24/30 to improve gait stability and reduce risk for falls. Baseline:  Goal status: IN PROGRESS  11/26/22 - 19/30  10.  Patient will be able to transition floor to/from stand w/o assistance or LOB to allow her complete chores around her home and work in her garden. Baseline:  Goal status: IN PROGRESS  11/28/22    PLAN:  PT FREQUENCY: 2x/week  PT DURATION: 6 weeks  PLANNED INTERVENTIONS: Therapeutic exercises, Therapeutic activity, Neuromuscular re-education, Balance training, Gait training, Patient/Family education, Self Care, Joint mobilization, DME instructions, Aquatic Therapy, Dry Needling, Electrical stimulation, Cryotherapy, Moist heat, scar mobilization, Taping, Ultrasound, Ionotophoresis 4mg /ml Dexamethasone, Manual therapy, and Re-evaluation - [phone] approved for E-stim, but [id number] (E-stim) not approved per [facility] prior authorization  PLAN FOR NEXT SESSION: quadruped and tall kneeling exercises to promote better control and safety with working in the garden; STS from low surfaces and floor transfers to improve safety with activities requiring her to get down to the ground or low heights; continue core/lumbar and LE strengthening; dynamic stepping, balance training and gait stability, SLS activities; review updated/consolidated HEP as indicated    Marry Guan, PT 12/26/2022, 3:35 PM

## 2022-12-31 ENCOUNTER — Encounter: Payer: Self-pay | Admitting: Physical Therapy

## 2022-12-31 ENCOUNTER — Ambulatory Visit: Payer: Medicare PPO | Admitting: Physical Therapy

## 2022-12-31 DIAGNOSIS — M6281 Muscle weakness (generalized): Secondary | ICD-10-CM

## 2022-12-31 DIAGNOSIS — R2689 Other abnormalities of gait and mobility: Secondary | ICD-10-CM

## 2022-12-31 DIAGNOSIS — R2681 Unsteadiness on feet: Secondary | ICD-10-CM

## 2022-12-31 DIAGNOSIS — M5416 Radiculopathy, lumbar region: Secondary | ICD-10-CM

## 2022-12-31 NOTE — Therapy (Addendum)
OUTPATIENT PHYSICAL THERAPY TREATMENT / DISCHARGE SUMMARY    Patient Name: [name] Trujillo MRN: [id number] DOB:1940-11-14, 82 y.o., female Today's Date: 12/31/2022  END OF SESSION:  PT End of Session - 12/31/22 1401     Visit Number 30    Date for PT Re-Evaluation 01/07/23    Authorization Type [employer]    Authorization Time Period 08/27/23 - 01/07/23: 10 additional visits as of 11/28/22, for a total of 34 until 01/07/23    Authorization - Visit Number 30    Authorization - Number of Visits 34    Progress Note Due on Visit 32   Recert/PN on visit #22 (11/26/22)   PT Start Time 1401    PT Stop Time 1443    PT Time Calculation (min) 42 min    Activity Tolerance Patient tolerated treatment well    Behavior During Therapy [facility] for tasks assessed/performed                    Past Medical History:  Diagnosis Date   High cholesterol    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   She had ACUTE TAKOTSUBO (STRESS) CARDIOMYOPATHY with elevated Troponin Levels - this would be considered "Demand Ischemia - Demand Infarction" & NOT associated with ACS/CAD.     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  - Cath with NO CAD. ==> RESOLVED: f/u Echo 03/2022: EF 60-65%.   Past Surgical History:  Procedure Laterality Date   APPEN[age]CTOMY     26-18 yo   BACK SURGERY     Age 63   CESAREAN SECTION     ECTOPIC PREGNANCY SURGERY     LAPAROSCOPIC HYSTERECTOMY     LEFT HEART CATH AND CORONARY ANGIOGRAPHY N/A 01/09/2022   Procedure: LEFT HEART CATH AND CORONARY ANGIOGRAPHY;  Surgeon: Tonny Bollman, MD;  Location: [facility] INVASIVE CV LAB;  Service: CV:: Widely patent coronaries with mild  nonobstructive LAD plaquing.  Right dominant system.  Normal LVEDP.  Based on clinical presentation, findings are consistent with acute Takotsubo Cardiomyopathy Syndrome   POSTERIOR FUSION LUMBAR SPINE  05/24/2022   [facility], [place]; [name], MD): L2-L3 XLIF, L2-L3 POSTERIOR DECOMPRESSION AND FUSION   TRANSTHORACIC ECHOCARDIOGRAM  01/08/2022   Severely decreased LV function-EF 25 to 30%.  Mid to apical (mostly anterior) with normal basal motion.  GR 2 DD-elevated LAP.  Mildly dilated LA.  Aortic sclerosis with no stenosis.  No AI.  Normal MV with mild to moderate TR.  Mildly elevated RAP, and PAP (estimated 49 mg). If LAD CAD ruled out - consistent with Takotsubo CM Syndrome.   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    PCP: Mosetta Putt, MD  REFERRING PROVIDER: Rondel Oh, NP for Marlaine Hind, MD   REFERRING DIAG: Z98.1 (ICD-10-CM) - S/P lumbar spinal fusion   THERAPY DIAG:  Muscle weakness (generalized)  Radiculopathy, lumbar region  Other abnormalities of gait and mobility  Unsteadiness on feet  RATIONALE FOR EVALUATION AND TREATMENT: Rehabilitation  ONSET DATE: 05/24/2022 - L2-3 XLIF/PSF   NEXT MD VISIT: September 2024    SUBJECTIVE:                                                                                                                                                                                                         SUBJECTIVE  STATEMENT:  Pt reports awareness of progress, noting less reliance on reaching for walls or furniture to stabilize herself in standing or walking, as well as increase standing tolerance as she was able to stand for several hours while cutting fabric yesterday.  PAIN: Are you having pain? No  PERTINENT HISTORY:  L2-3 XLIF/PSF 05/24/22; C5-7 ACDF 09/21/20; idiopathic progressive neuropathy; hypthyroidism; DDD; remote h/o back surgery at age 5; h/o scoliosis; Takotsubo cardiomyopathy 01/10/22   PRECAUTIONS: Back - No BLT, TLSO discontinued as of 09/11/22  WEIGHT BEARING RESTRICTIONS: No  FALLS:  Has patient fallen in last 6 months? No  LIVING ENVIRONMENT: Lives with: lives with their spouse Lives in: House/apartment Stairs: Yes: External: 5-6 steps; on left going up Has following equipment at home: Walker - 2 wheeled, shower chair, and hiking/walking poles  OCCUPATION: Retired Runner, broadcasting/film/video  PLOF: Independent and Leisure: walking daily for total of ~1.5 miles; working in the yard; sewing/quilting    PATIENT GOALS: "Be able to move and do things in the house and yard w/o worrying about falling."   OBJECTIVE:   DIAGNOSTIC FINDINGS:  08/27/22 - Lumbar spine x-ray completed as part of postop series - results pending   07/01/22 - Lumbar spine x-ray: Expected postoperative appearance related to L2-L3 fixation.   06/12/21 - Lumbar MRI: Postoperative and degenerative changes of the lumbar spine as described, similar compared to July 28, 2020. At L2-L3, there is moderate spinal canal stenosis due to disc bulging, endplate osteophytic spurring, facet and ligamentum flavum hypertrophy. Milder spinal canal stenosis is seen at L3-L4. There is multilevel neural foraminal stenosis.   PATIENT SURVEYS:  Modified Oswestry 22 / 50 = 44.0%, 23 / 50 = 46.0% (10/15/22)   SCREENING FOR RED FLAGS: Bowel or bladder incontinence: Yes: bladder - takes Vesicare Spinal tumors: No Cauda equina syndrome:  No Compression fracture: No Abdominal aneurysm: No  COGNITION:  Overall cognitive status: Within functional limits  for tasks assessed    SENSATION: WFL - Pt reports LE sensation has improved since surgery  MUSCLE LENGTH: NT  POSTURE:  rounded shoulders, forward head, and right pelvic obliquity  PALPATION: NT  LUMBAR ROM: Deferred on eval pending MD clearance to release her from post-surgical precautions   Active  AROM  10/08/22 * AROM  11/05/22 AROM 11/26/22 *  Flexion Hands to mid shin Hands to mid shin  Hands to toes with slight knee flexion   Extension 50% limited Limited 25%  Limited 25%  Right lateral flexion Hand to lateral knee Hand to lateral knee Hand to just below knee  Left lateral flexion Hand to lateral knee Hand to lateral knee Hand to just below knee  Right rotation 50% limited Limited due to balance  25% limited  Left rotation 50% limited WNL WNL  (Blank rows = not tested)  (* - ROM assessment limited in standing due to balance deficits)  LOWER EXTREMITY ROM:    Grossly WFL  LOWER EXTREMITY MMT:  (tested in sitting on eval, traditional testing positions for all subsequent testing)  MMT Right  eval Left    eval Right 10/08/22 Left 10/08/22 Right 11/05/22 Left 11/05/22 Right 11/18/22 Left 11/18/22  Hip flexion 4 4 4 4 4 4 4 4   Hip extension 4- 4- 4- 3- 4+ 4+ 4+ 4+  Hip abduction 4+ 4+ 4+ 4 4+ 4+ 4+ 4+  Hip adduction 4+ 4+ 3+ 4 3+ 4 4- 4  Hip internal rotation 4- 4- 4 4 4 4  4+ 4+  Hip external rotation 3+ 3+ 4- 3+ 4 4 4+ 4  Knee flexion 4+ 4+ 5 5 5 5 5 5   Knee extension 4+ 4+ 4+ 4+ 4+ 4+ 5 5  Ankle dorsiflexion 3- 3- 3 3- 3 3 3+ 3  Ankle plantarflexion 4- NWB 4- NWB 4+ NWB 4 NWB 4+ NWB 4+ NWB 4+ NWB 4+ NWB  Ankle inversion 3- 2- 3+ 2 4 2+ 4 2+  Ankle eversion 4- 3+ 4 3+ 4 4 4 4    (Blank rows = not tested)  LUMBAR SPECIAL TESTS:  NT  FUNCTIONAL TESTS:  5 times sit to stand: 22.19 sec  Timed up and go (TUG): 14.84 sec with hiking pole (08/28/22) 10 meter  walk test: 15.47 sec with hiking pole, Gait speed = 2.12 ft/sec (08/28/22) Berg Balance Scale: 27/56; < 36 high risk for falls (close to 100%) Functional gait assessment: 8/30; < 19 = high risk fall (08/28/22) Left hip Trendelenburg evident in right SLS  10/08/22 5xSTS: 22.87 sec TUG: 13.44 sec with hiking pole, 13.78 sec w/o AD : 16.03 sec with hiking pole, 16.19 sec w/o AD;  Gait speed = 2.05 ft/sec with hiking pole, 2.03 ft/sec w/o AD Berg: 36/56; < 36 high risk for falls (close to 100%)  10/15/22 FGA: 18/30; < 19 = high risk fall   11/05/22 TUG: 11.16 sec - no hiking pole : 14.88 sec - no hiking pole  Gait speed = 2.20 ft/sec FGA: 22/30 w/o hiking pole; 19-24 = medium risk fall   11/18/22 Berg: 41/56; 37-45 significant risk for falls (>80%)   11/20/22 5xSTS: 10.88 sec  11/26/22 : 16.16 sec w/o AD Gait speed: 2.03 ft/sec FGA: 19/30; 19-24 = medium risk fall   12/31/22 5xSTS: 10.72 sec Berg: 48/56; 46-51 moderate risk for falls (>50%)  : 16.15 sec w/o AD Gait speed: 2.03 ft/sec  GAIT: Distance walked: 60 Assistive device utilized:  single walking/hiking pole on  R Level of assistance: SBA Gait pattern:  increased sway, step through pattern, decreased stride length, trendelenburg, and lateral hip instability Comments: L>R foot slap   TODAY'S TREATMENT:   12/31/22 THERAPEUTIC EXERCISE: to improve flexibility, strength and mobility.  Verbal and tactile cues throughout for technique.  Rec bike - L4 x 6 min (seat #2)  THERAPEUTIC ACTIVITIES: 5xSTS: 10.72 sec Berg: 48/56; 46-51 moderate risk for falls (>50%)  : 16.15 sec w/o AD Gait speed: 2.03 ft/sec Floor transfer w/o furniture support x 2 - 1st rep with one hand on upper knee in 1/2 kneel and other hand on floor (pt needing external assist from PT due to LOB), 2nd rep with one hand on upper knee in 1/2 kneel and other hand on hiking pole (still with minor LOB but able to better  self-correct)   12/26/22 THERAPEUTIC EXERCISE: to improve flexibility, strength and mobility.  Verbal and tactile cues throughout for technique.  Rec bike - L4 x 6 min (seat #2) Verbally reviewed HEP with patient describing which exercises she has been focusing on and PT providing guidance on areas needing further attention - HEP handout updated reflect consensus on HEP exercises/activities  THERAPEUTIC ACTIVITIES: Demonstrated floor to stand transfers without external upper extremity support pressing up with hands on elevated knee and opposite hand pushing from floor versus using her hiking pole.  NEUROMUSCULAR RE-EDUCATION: To improve balance, proprioception, coordination, and reduce fall risk. Corner balance progression with narrow BOS on Airex pad with intermittent UE support on back of chair            Eyes open: - static stance x 30 sec - horiz head turns x 5 - vertical head nods x 5 - trunk rotation x 5   12/24/22 THERAPEUTIC EXERCISE: to improve flexibility, strength and mobility.  Verbal and tactile cues throughout for technique.  Rec bike - L4 x 6 min (seat #2) Counter squat with gradually increasing depth of squat x 10  THERAPEUTIC ACTIVITIES: Simulated laundry related tasks and provided options to put her at a better height for taking things out of the dryer - tall knee, 1/2 kneel vs sitting on stool Simulated STS from lower surface such as stool for above (SBA of PT): STS from 14" box x 10 STS 9" stool + Airex pad (12" total height) x 10 Discussed best options for working in the garden, using stool vs kneeling w/ or w/o kneeler   PATIENT EDUCATION:  Education details: progress with PT and ongoing PT POC Person educated: Patient Education method: Explanation Education comprehension: verbalized understanding  HOME EXERCISE PROGRAM: Access Code: [email or web address] URL: [email or web address] Date: 11/28/2022 Prepared by: Glenetta Hew  Exercises - Seated Heel  Toe Raises  - 1 x daily - 7 x weekly - 2 sets - 10 reps - 3 sec hold - Standing Isometric Hip Abduction with Ball on Wall  - 1 x daily - 7 x weekly - 2 sets - 10 reps - 3 sec hold - Seated Transversus Abdominis Bracing  - 2 x daily - 7 x weekly - 2 sets - 10 reps - 3-5 sec hold - Seated Isometric Hip Abduction with Resistance  - 1 x daily - 3 x weekly - 2 sets - 10 reps - 3 sec hold - Seated March with Resistance  - 1 x daily - 3 x weekly - 2 sets - 10 reps - 3 sec hold - Seated Shoulder Row with Anchored Resistance  - 1 x daily - 3 x weekly -  2 sets - 10 reps - 3-5 hold hold - Seated Single Arm Shoulder Row with Anchored Resistance  - 1 x daily - 3 x weekly - 2 sets - 10 reps - 3 sec hold - Seated Anti-Rotation Press With Anchored Resistance  - 1 x daily - 3 x weekly - 2 sets - 10 reps - 3 sec hold - Tall Kneeling Hip Hinge  - 1 x daily - 3-4 x weekly - 2 sets - 10 reps - 3 sec hold  Quadruped PWR! Up & Step   ASSESSMENT:  CLINICAL IMPRESSION:  Mileigh reports improved stability with standing and walking with decreasing reliance on need for external support, as well as ability to resume more of her normal daily tasks with improving confidence.  Initiated LTG assessment in anticipation of likely transition to HEP next visit.  Gait speed unchanged but decreased foot slap evident.  Berg improved to 48/56, reducing her fall risk to moderate (>50%) from significant (>80%).  She continues to to have difficulty with floor to stand transition without external support, however is able to complete transition independently with external support (I.e. furniture/countertop) - we reviewed how she could use her hiking pole for support and circumstances where other options are not available.  Will complete remaining goal assessment next visit as well as any review of HEP as needed.  OBJECTIVE IMPAIRMENTS: Abnormal gait, decreased activity tolerance, decreased balance, decreased coordination, decreased endurance,  decreased mobility, difficulty walking, decreased ROM, decreased strength, decreased safety awareness, impaired perceived functional ability, impaired flexibility, improper body mechanics, postural dysfunction, and pain.   ACTIVITY LIMITATIONS: carrying, lifting, bending, sitting, standing, squatting, sleeping, stairs, transfers, bed mobility, bathing, toileting, dressing, reach over head, locomotion level, and caring for others  PARTICIPATION LIMITATIONS: meal prep, cleaning, laundry, interpersonal relationship, driving, shopping, community activity, yard work, and church  PERSONAL FACTORS: Age, Past/current experiences, Time since onset of injury/illness/exacerbation, and 3+ comorbidities: C5-7 ACDF 09/21/20; idiopathic progressive neuropathy; hypthyroidism; DDD; remote h/o back surgery at age 5; h/o scoliosis; Takotsubo cardiomyopathy 01/10/22   are also affecting patient's functional outcome.   REHAB POTENTIAL: Good  CLINICAL DECISION MAKING: Evolving/moderate complexity  EVALUATION COMPLEXITY: Moderate   GOALS: Goals reviewed with patient? Yes  SHORT TERM GOALS: Target date: 09/23/2022  Patient will be independent with initial HEP to improve outcomes and carryover.  Baseline:  Goal status: MET  09/13/22  2.  Complete balance assessment and update LTGs as indicated.  Baseline:  Goal status: MET  08/28/22  LONG TERM GOALS: Target date: 10/21/2022, extended to 12/03/2022, extended to 01/07/2023   Patient will be independent with ongoing/advanced HEP for self-management at home.  Baseline:  Goal status: PARTIALLY MET  12/31/22 - met for current HEP  2.  Patient to demonstrate ability to achieve and maintain good spinal alignment/posturing and body mechanics needed for daily activities. Baseline:  Goal status: MET  10/08/22 - pt demonstrates good awareness of neutral spine alignment; 10/29/22 - refresher indicated in relation to household cleaning tasks  3.  Patient will demonstrate  functional pain free lumbar ROM to perform ADLs.   Baseline:  Goal status: PARTIALLY MET  11/26/22 - mostly WFL w/o pain but still mildly limited in extension and R rotation (balance also continues to limit functional ROM)  4.  Patient will demonstrate improved B proximal LE strength to >/= 4 to 4+/5 for improved stability and ease of mobility. Baseline: refer to MMT table above Goal status: IN PROGRESS  11/18/22 - knee strength 5/5, but  continued hip weakness esp with fatigue  5  Patient will demonstrate improved B ankle strength to >/= 3+ to 4-/5 for improved gait stability. Baseline: refer to MMT table above Goal status: PARTIALLY MET  11/18/22 - Met for R ankle, although limited endurance with R DF creating foot slap with fatigue, In progress for L ankle  6.  Patient will ambulate with improved gait pattern with decreased B foot drop/slap and no evidence of instability/LOB. Baseline:  Goal status: IN PROGRESS  12/12/22 - Continued intermittent L>R foot slap ~30% of the time when walking, more evident with fatigue, as well as L>R Trendelenburg hip drop.   7. Patient will report </= 32% on Modified Oswestry to demonstrate improved functional ability with decreased pain interference. Baseline: 22 / 50 = 44.0% (eval); 23 / 50 = 46.0% (10/15/22) Goal status: MET  11/05/22:11 / 50 = 22.0 %  8.  Patient will improve Berg score to >/= 36/56 to improve safety and stability with ADLs in standing and reduce risk for falls. Baseline: 27/56 Goal status: MET and REVISED (see 8a.below) 10/08/22 - Berg = 36/56  8a.  Patient will improve Berg score to >/= 44/56 to improve safety and stability with ADLs in standing and reduce risk for falls. Baseline: 36/56 (10/08/22) Goal status: MET  12/31/22 - Berg = 48/56  9.  Patient will improve FGA score to >/= 19/30 to improve gait stability and reduce risk for falls. Baseline: 8/30 (08/28/22); 18/30 (10/15/22) Goal status: MET  and REVISED (see 9a below) 11/05/22 - FGA =  22/30    9a.  Patient will improve FGA score to >/= 24/30 to improve gait stability and reduce risk for falls. Baseline:  Goal status: IN PROGRESS  11/26/22 - 19/30  10.  Patient will be able to transition floor to/from stand w/o assistance or LOB to allow her complete chores around her home and work in her garden. Baseline:  Goal status: IN PROGRESS  12/31/22 - patient able to transition from floor to stand, but continues to need to rely on upper extremity support with LOB demonstrated when attempting without external support   PLAN:  PT FREQUENCY: 2x/week  PT DURATION: 6 weeks  PLANNED INTERVENTIONS: Therapeutic exercises, Therapeutic activity, Neuromuscular re-education, Balance training, Gait training, Patient/Family education, Self Care, Joint mobilization, DME instructions, Aquatic Therapy, Dry Needling, Electrical stimulation, Cryotherapy, Moist heat, scar mobilization, Taping, Ultrasound, Ionotophoresis 4mg /ml Dexamethasone, Manual therapy, and Re-evaluation - 97032 approved for E-stim, but G0283 (E-stim) not approved per [facility] prior authorization  PLAN FOR NEXT SESSION: Complete goal assessment: #1 - final HEP review; #3 - lumbar ROM, #4 & 5 - LE MMT, #9a - FGA and reassess #10 - floor to stand transfers; anticipate transition to HEP +/- 30-day hold   Marry Guan, PT 12/31/2022, 8:46 PM   PHYSICAL THERAPY DISCHARGE SUMMARY  Visits from Start of Care: 30  Current functional level related to goals / functional outcomes: Refer to above clinical impression and goal assessment for status as of last visit on 12/31/22. Final scheduled visit was cancelled due to therapist illness and patient opted not to reschedule, therefore will proceed with discharge from PT for this episode.     Remaining deficits: As above. Unable to complete remaining discharge assessment due to missed visit secondary to PT illness.   Education / Equipment: HEP   Patient agrees to discharge.  Patient goals were partially met. Patient is being discharged due to being pleased with the current functional level.  Marry Guan, PT 03/18/23, 11:19 AM  [facility] [address]  [address] [place], [place], [address] Phone: [phone]   Fax:  [phone]

## 2023-01-02 ENCOUNTER — Encounter: Payer: Medicare PPO | Admitting: Physical Therapy

## 2023-01-07 ENCOUNTER — Ambulatory Visit: Payer: Medicare PPO | Admitting: Physical Therapy

## 2023-01-20 ENCOUNTER — Ambulatory Visit: Payer: Medicare PPO | Attending: Cardiology | Admitting: Cardiology

## 2023-01-20 ENCOUNTER — Encounter: Payer: Self-pay | Admitting: Cardiology

## 2023-01-20 VITALS — BP 114/62 | HR 72 | Ht 62.0 in | Wt 126.0 lb

## 2023-01-20 DIAGNOSIS — I21A1 Myocardial infarction type 2: Secondary | ICD-10-CM

## 2023-01-20 DIAGNOSIS — I5181 Takotsubo syndrome: Secondary | ICD-10-CM | POA: Diagnosis not present

## 2023-01-20 DIAGNOSIS — I493 Ventricular premature depolarization: Secondary | ICD-10-CM | POA: Diagnosis not present

## 2023-01-20 DIAGNOSIS — I3489 Other nonrheumatic mitral valve disorders: Secondary | ICD-10-CM

## 2023-01-20 DIAGNOSIS — E785 Hyperlipidemia, unspecified: Secondary | ICD-10-CM

## 2023-01-20 NOTE — Patient Instructions (Signed)
Medication Instructions:  Not needed  *If you need a refill on your cardiac medications before your next appointment, please call your pharmacy*   Lab Work: Not needed .   Testing/Procedures: Will be schedule in JULY 2024  at [facility] [address] Your physician has requested that you have an echocardiogram. Echocardiography is a painless test that uses sound waves to create images of your heart. It provides your doctor with information about the size and shape of your heart and how well your heart's chambers and valves are working. This procedure takes approximately one hour. There are no restrictions for this procedure. Please do NOT wear cologne, perfume, aftershave, or lotions (deodorant is allowed). Please arrive 15 minutes prior to your appointment time.  If test is normal follow is as needed , if there is an abnormality will follow up visit in 12 months   Follow-Up: At [facility], you and your health needs are our priority.  As part of our continuing mission to provide you with exceptional heart care, we have created designated Provider Care Teams.  These Care Teams include your primary Cardiologist (physician) and Advanced Practice Providers (APPs -  Physician Assistants and Nurse Practitioners) who all work together to provide you with the care you need, when you need it.     Your next appointment:   12 month(s)  The format for your next appointment:   In Person  Provider:   Bryan Lemma, MD    Other Instructions

## 2023-01-20 NOTE — Progress Notes (Signed)
Primary Care Provider: Mosetta Putt, MD [facility] Cardiologist: Bryan Lemma, MD Electrophysiologist: None  Clinic Note: Chief Complaint  Patient presents with   2 Week Follow-up    6 months.  Doing well.  No major issues.   ===================================  ASSESSMENT/PLAN   Problem List Items Addressed This Visit       Cardiology Problems   Ventricular premature beats   Relevant Orders   EKG 12-Lead (Completed)   ECHOCARDIOGRAM COMPLETE   Takotsubo cardiomyopathy-resolved (Chronic)    Follow-up echocardiogram showed improved EF back to normal zone.  There was some evidence of mitral valvular disease.  This will need to be followed up in the next couple months.  This will also ensure her EF is stable.  Complete recovery from her cardiomyopathy.  Plan: Spironolactone was discontinued last visit, now continuing 2.5 mg of losartan and 12.5 mg of Toprol for maintenance, notably because of the valvular disease. Euvolemic.  NYHA class I.      Relevant Orders   EKG 12-Lead (Completed)   ECHOCARDIOGRAM COMPLETE   Myxomatous degeneration of mitral valve (Chronic)    Difficult for her to hear.  Not sure how significant it is.  The plan was to follow-up to see if it is stable.  We will check an echo this July.  If stable can then wait a couple years.  Continue beta-blocker and ARB.      Hyperlipidemia with target LDL less than 100 (Chronic)    She seems to doing okay on rosuvastatin.  I do not think that rosuvastatin is affecting her neuropathy.  I think it predated the statin.  However not unreasonable follow-up if necessary to do a statin holiday to reassess.  Will refer to her PT and PCP.      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  She did have chest pain which was associated with the myopathy.  EF now resolved.  No further symptoms.  Remains on  beta-blocker and ARB.       ===================================  HPI:    [name] Trujillo is a 82 y.o. female with a PMH notable for HLD, hypothyroidism, vestibular dysfunction and history of (resolved) Takotsubo Cardiomyopathy who presents today for 39-month at the request of Mosetta Putt, MD.  Admitted May 2023-Takotsubo Cardiomyopathy (elevated troponin-type II MI).  This occurred after watching her husband fall flat on his face in the driveway.  She was very concerned about that.  She developed chest tightness radiating to the jaw, as well as left arm and shoulder associate with dyspnea.  => EF by Echo was 25 to 30%.  Discharged on low-dose Toprol, losartan and spironolactone.  Follow-up echo in August 2023 showed EF 60 to 65% with mild MR.  Will reduce Toprol to 12.5 mg daily.  [name] Trujillo was last seen on July 22, 2022 as a 60-month follow-up.  She was accompanied by her daughters.  Doing well.  Was back to walking completely but had had taken a break because of her back surgery.  She is wearing her back brace but is happy no longer has the back pain.  Some postural dizziness. => She initially was doing cardiac rehab and then had L-spine posterior decompression and fusion in [place].  Recent Hospitalizations: None  She has been working with physical therapy for balance issues.  Uses cane for short distances but walker for longer distances.  Reviewed  CV studies:    The following studies were reviewed today: (if available, images/films reviewed: From Epic Chart or Care Everywhere) None:  Interval History:   [name] Trujillo returns here today overall doing pretty well.  She now has to walk with a cane for balance but for long distances she may use wheelchair.  She has been having lots of balance issues with occasional blurred vision.  She is still trying to stay as active as possible.  Still working with PT, and per their recommendations is using a cane for gait  stability.  She is relatively asymptomatic from a cardiac standpoint.  She walks about she wants to during the day with cane and denies any issues at all or shortness of breath or chest pain/pressure.  She does insist that now she is more activities and is less short of breath. Very stable for cardiac standpoint CV Review of Symptoms (Summary): no chest pain or dyspnea on exertion negative for - edema, irregular heartbeat, loss of consciousness, orthopnea, palpitations, paroxysmal nocturnal dyspnea, rapid heart rate, shortness of breath, or near syncope, TIA/amaurosis fugax/CVA or claudication.  REVIEWED OF SYSTEMS   Review of Systems  Constitutional:  Negative for malaise/fatigue and weight loss.  HENT:  Negative for congestion.   Respiratory:  Negative for cough and shortness of breath.   Gastrointestinal:  Negative for abdominal pain, blood in stool, constipation, diarrhea and melena.  Genitourinary:  Positive for frequency. Negative for hematuria.  Musculoskeletal:  Positive for back pain and joint pain. Negative for falls.  Neurological:  Positive for dizziness (More of a vestibular issue and poor balance.) and weakness (She has some leg weakness after back surgery.). Negative for focal weakness and loss of consciousness.  Psychiatric/Behavioral:  Negative for depression and memory loss. The patient is not nervous/anxious and does not have insomnia.     I have reviewed and (if needed) personally updated the patient's problem list, medications, allergies, past medical and surgical history, social and family history.   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   She had ACUTE TAKOTSUBO (STRESS) CARDIOMYOPATHY with elevated Troponin Levels - this would be considered "Demand Ischemia - Demand  Infarction" & NOT associated with ACS/CAD.     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  - Cath with NO CAD. ==> RESOLVED: f/u Echo 03/2022: EF 60-65%.    PAST SURGICAL HISTORY   Past Surgical History:  Procedure Laterality Date   APPEN[age]CTOMY     17-18 yo   BACK SURGERY     Age 82   CESAREAN SECTION     ECTOPIC PREGNANCY SURGERY     LAPAROSCOPIC HYSTERECTOMY     LEFT HEART CATH AND CORONARY ANGIOGRAPHY N/A 01/09/2022   Procedure: LEFT HEART CATH AND CORONARY ANGIOGRAPHY;  Surgeon: Tonny Bollman, MD;  Location: [facility] INVASIVE CV LAB;  Service: CV:: Widely patent coronaries with mild nonobstructive LAD plaquing.  Right dominant system.  Normal LVEDP.  Based on clinical presentation, findings are consistent with acute Takotsubo Cardiomyopathy Syndrome   POSTERIOR FUSION LUMBAR SPINE  05/24/2022   [facility],  Timoteo Gaul, MD): L2-L3 XLIF, L2-L3 POSTERIOR DECOMPRESSION AND FUSION   TRANSTHORACIC ECHOCARDIOGRAM  01/08/2022   Severely decreased LV function-EF 25 to 30%.  Mid to apical (mostly anterior) with normal basal motion.  GR 2 DD-elevated LAP.  Mildly dilated LA.  Aortic sclerosis with no stenosis.  No AI.  Normal MV with mild to moderate TR.  Mildly elevated RAP, and PAP (estimated 49 mg). If LAD CAD ruled out - consistent with Takotsubo CM Syndrome.   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     There is no immunization history on file for this patient.  MEDICATIONS/ALLERGIES   Current Meds  Medication Sig   acetaminophen (TYLENOL) 325 MG tablet Take 975 mg by mouth daily as needed for moderate pain.   Carboxymethylcellulose Sodium (REFRESH TEARS OP) Place 2  drops into both eyes 3 (three) times daily.   levothyroxine (SYNTHROID) 88 MCG tablet Take 88 mcg by mouth daily before breakfast.   losartan (COZAAR) 25 MG tablet Take 1/2 tablet (12.5 mg total) by mouth daily.   metoprolol succinate (TOPROL XL) 25 MG 24 hr tablet Take 0.5 tablets (12.5 mg total) by mouth daily.   rosuvastatin (CRESTOR) 10 MG tablet Take 1 tablet (10 mg total) by mouth at bedtime. (Patient taking differently: Take 20 mg by mouth at bedtime. Increased from 10 mg to 20 mg starting tonight)   solifenacin (VESICARE) 10 MG tablet Take 10 mg by mouth daily.   vitamin B-12 (CYANOCOBALAMIN) 1000 MCG tablet Take 1,000 mcg by mouth daily.    Allergies  Allergen Reactions   Oxycodone Hcl Nausea And Vomiting   Primidone    Propranolol    Tolterodine    Hydrocodone Nausea And Vomiting    SOCIAL HISTORY/FAMILY HISTORY   Reviewed in Epic:  Pertinent findings:  Social History   Tobacco Use   Smoking status: Never   Smokeless tobacco: Never  Substance Use Topics   Alcohol use: Yes    Comment: once or twice a month   Drug use: No   Social History   Social History Narrative   Lives at home w/ her husband    Right-handed   Caffeine: 2 cups of coffee in the morning    OBJCTIVE -PE, EKG, labs   Wt Readings from Last 3 Encounters:  01/20/23 126 lb (57.2 kg)  07/22/22 123 lb 3.2 oz (55.9 kg)  04/09/22 125 lb (56.7 kg)    Physical Exam: BP 114/62   Pulse 72   Ht 5\' 2"  (1.575 m)   Wt 126 lb (57.2 kg)   SpO2 95%   BMI 23.05 kg/m  Physical Exam Vitals reviewed.  Constitutional:      General: She is not in acute distress.    Appearance: Normal appearance. She is normal weight. She is not ill-appearing (Looks good for stated age.) or toxic-appearing.  HENT:     Head: Normocephalic and atraumatic.  Neck:     Vascular: Normal carotid pulses. No carotid bruit or JVD.  Cardiovascular:     Rate and Rhythm: Normal rate and regular rhythm. No extrasystoles are  present.    Chest Wall: PMI is not displaced.     Pulses: Normal pulses.     Heart sounds: S1 normal and S2 normal. No murmur (I do not hear an MR murmur.) heard.    No friction rub. No gallop. No S4  sounds.  Pulmonary:     Effort: Pulmonary effort is normal. No respiratory distress.     Breath sounds: Normal breath sounds. No wheezing, rhonchi or rales.  Musculoskeletal:        General: No swelling. Normal range of motion.     Cervical back: Normal range of motion and neck supple.  Skin:    General: Skin is warm and dry.  Neurological:     General: No focal deficit present.     Mental Status: She is alert and oriented to person, place, and time.     Gait: Gait abnormal.  Psychiatric:        Mood and Affect: Mood normal.        Behavior: Behavior normal.        Thought Content: Thought content normal.        Judgment: Judgment normal.      Adult ECG Report  Rate: 72 ;  Rhythm: normal sinus rhythm; normal axis, intervals and durations.  Narrative Interpretation: Normal  Recent Labs: Reviewed Lab Results  Component Value Date   CHOL 144 01/08/2022   HDL 58 01/08/2022   LDLCALC 81 01/08/2022   TRIG 25 01/08/2022   CHOLHDL 2.5 01/08/2022   Lab Results  Component Value Date   CREATININE 0.84 01/29/2022   BUN 19 01/29/2022   NA 139 01/29/2022   K 5.1 01/29/2022   CL 102 01/29/2022   CO2 23 01/29/2022      Latest Ref Rng & Units 01/09/2022    5:03 AM 01/08/2022    4:25 AM 01/07/2022   10:34 PM  CBC  WBC 4.0 - 10.5 K/uL 6.2  10.8  10.2   Hemoglobin 12.0 - 15.0 g/dL 16.1  09.6  04.5   Hematocrit 36.0 - 46.0 % 38.8  37.9  41.1   Platelets 150 - 400 K/uL 212  261  245     Lab Results  Component Value Date   HGBA1C 5.3 01/08/2022   Lab Results  Component Value Date   TSH 0.275 (L) 04/05/2020    ================================================== I spent a total of 18 minutes with the patient spent in direct patient consultation.  Additional time spent with chart  review  / charting (studies, outside notes, etc): 14 min Total Time: 32 min  Current medicines are reviewed at length with the patient today.  (+/- concerns) N/A  Notice: This dictation was prepared with Dragon dictation along with smart phrase technology. Any transcriptional errors that result from this process are unintentional and may not be corrected upon review.  Studies Ordered:   Orders Placed This Encounter  Procedures   EKG 12-Lead   ECHOCARDIOGRAM COMPLETE   No orders of the defined types were placed in this encounter.   Patient Instructions / Medication Changes & Studies & Tests Ordered   Patient Instructions  Medication Instructions:  Not needed  *If you need a refill on your cardiac medications before your next appointment, please call your pharmacy*   Lab Work: Not needed .   Testing/Procedures: Will be schedule in JULY 2024  at [facility] [address] Your physician has requested that you have an echocardiogram. Echocardiography is a painless test that uses sound waves to create images of your heart. It provides your doctor with information about the size and shape of your heart and how well your heart's chambers and valves are working. This procedure takes approximately one hour. There are no restrictions for this procedure. Please do NOT wear  cologne, perfume, aftershave, or lotions (deodorant is allowed). Please arrive 15 minutes prior to your appointment time.  If test is normal follow is as needed , if there is an abnormality will follow up visit in 12 months   Follow-Up: At [facility], you and your health needs are our priority.  As part of our continuing mission to provide you with exceptional heart care, we have created designated Provider Care Teams.  These Care Teams include your primary Cardiologist (physician) and Advanced Practice Providers (APPs -  Physician Assistants and Nurse Practitioners) who all work together to provide you  with the care you need, when you need it.     Your next appointment:   12 month(s)  The format for your next appointment:   In Person  Provider:   Bryan Lemma, MD    Other Instructions      Marykay Lex, MD, MS Bryan Lemma, M.D., M.S. Interventional Cardiologist  [facility]  Pager # [phone] Phone # [phone] [address] [place], [place] [address]   Thank you for choosing [facility] [place]!!

## 2023-01-26 ENCOUNTER — Encounter: Payer: Self-pay | Admitting: Cardiology

## 2023-01-26 NOTE — Assessment & Plan Note (Signed)
Type II MI in the setting of Takotsubo Cardiomyopathy.  She did have chest pain which was associated with the myopathy.  EF now resolved.  No further symptoms.  Remains on beta-blocker and ARB.

## 2023-01-26 NOTE — Assessment & Plan Note (Signed)
Difficult for her to hear.  Not sure how significant it is.  The plan was to follow-up to see if it is stable.  We will check an echo this July.  If stable can then wait a couple years.  Continue beta-blocker and ARB.

## 2023-01-26 NOTE — Assessment & Plan Note (Signed)
Follow-up echocardiogram showed improved EF back to normal zone.  There was some evidence of mitral valvular disease.  This will need to be followed up in the next couple months.  This will also ensure her EF is stable.  Complete recovery from her cardiomyopathy.  Plan: Spironolactone was discontinued last visit, now continuing 2.5 mg of losartan and 12.5 mg of Toprol for maintenance, notably because of the valvular disease. Euvolemic.  NYHA class I.

## 2023-01-26 NOTE — Assessment & Plan Note (Signed)
She seems to doing okay on rosuvastatin.  I do not think that rosuvastatin is affecting her neuropathy.  I think it predated the statin.  However not unreasonable follow-up if necessary to do a statin holiday to reassess.  Will refer to her PT and PCP.

## 2023-03-20 ENCOUNTER — Encounter: Payer: Self-pay | Admitting: Nurse Practitioner

## 2023-04-07 ENCOUNTER — Ambulatory Visit (INDEPENDENT_AMBULATORY_CARE_PROVIDER_SITE_OTHER): Payer: Medicare PPO

## 2023-04-07 DIAGNOSIS — I5181 Takotsubo syndrome: Secondary | ICD-10-CM

## 2023-04-07 DIAGNOSIS — I493 Ventricular premature depolarization: Secondary | ICD-10-CM | POA: Diagnosis not present

## 2023-04-07 LAB — ECHOCARDIOGRAM COMPLETE
Area-P 1/2: 5.02 cm2
S' Lateral: 1.59 cm

## 2023-04-08 ENCOUNTER — Other Ambulatory Visit: Payer: Self-pay | Admitting: *Deleted

## 2023-04-08 MED ORDER — LOSARTAN POTASSIUM 25 MG PO TABS: 12.5000 mg | ORAL_TABLET | Freq: Every day | ORAL | 3 refills | Status: DC

## 2023-05-06 DIAGNOSIS — H04123 Dry eye syndrome of bilateral lacrimal glands: Secondary | ICD-10-CM | POA: Insufficient documentation

## 2023-05-06 DIAGNOSIS — H524 Presbyopia: Secondary | ICD-10-CM | POA: Insufficient documentation

## 2023-05-20 ENCOUNTER — Telehealth: Payer: Self-pay | Admitting: Cardiology

## 2023-05-20 MED ORDER — METOPROLOL SUCCINATE ER 25 MG PO TB24: 12.5000 mg | ORAL_TABLET | Freq: Every day | ORAL | 2 refills | Status: DC

## 2023-05-20 NOTE — Telephone Encounter (Signed)
*  STAT* If patient is at the pharmacy, call can be transferred to refill team.   1. Which medications need to be refilled? (please list name of each medication and dose if known) metoprolol succinate (TOPROL XL) 25 MG 24 hr tablet  2. Which pharmacy/location (including street and city if local pharmacy) is medication to be sent to? [facility] [address] - [place], [place] - [address]  3. Do they need a 30 day or 90 day supply?  90 day supply  Patient states she spoke with the pharmacy and they informed her that they never received the prescription.

## 2023-05-20 NOTE — Telephone Encounter (Signed)
Pt's medication was sent to pt's pharmacy as requested. Confirmation received.  °

## 2023-05-28 ENCOUNTER — Ambulatory Visit: Payer: Medicare PPO | Admitting: Neurological Surgery

## 2023-06-26 ENCOUNTER — Telehealth: Payer: Self-pay

## 2023-06-26 NOTE — Telephone Encounter (Signed)
Attempted to call patient to confirm appt with XR.    Patient stated that he will obtain XR on the day on the 2nd floor prior to appt.        2:20PM ET  June 26, 2023   Tyson Babinski, [place]

## 2023-06-30 ENCOUNTER — Ambulatory Visit: Payer: Medicare PPO | Admitting: Neurological Surgery

## 2023-06-30 ENCOUNTER — Ambulatory Visit
Admission: RE | Admit: 2023-06-30 | Discharge: 2023-06-30 | Disposition: A | Discharge status: Home / Self Care | Payer: Medicare PPO | Source: Ambulatory Visit | Attending: Physician Assistant | Admitting: Physician Assistant

## 2023-06-30 VITALS — BP 167/71 | HR 99 | Resp 18 | Ht 61.5 in | Wt 124.4 lb

## 2023-06-30 DIAGNOSIS — Z981 Arthrodesis status: Secondary | ICD-10-CM | POA: Insufficient documentation

## 2023-06-30 DIAGNOSIS — M5416 Radiculopathy, lumbar region: Secondary | ICD-10-CM | POA: Insufficient documentation

## 2023-06-30 NOTE — Progress Notes (Signed)
[facility] Neurosurgery  Follow up Note    Previous Impression/Plan       Impression   Stephanie Castaneda is a 82 y.o. female who is now 5 months s/p L2-3 XLIF/ PSF on 05/24/22     I personally reviewed the x-ray studies of the lumbar spine demonstrating a stable instrumented fusion without evidence of hardware failure or malalignment.  Patient is neurologically intact on examination today.  The incision is well-healed without complication.       Plan   Continue LSO brace for an additional 4 weeks  Continue bone stimulator and calcitonin nasal spray  Advised patient to maintain spine precautions: no bending, lifting greater than 10 pounds, or twisting   May return to work at this time     HPI     Stephanie Castaneda is a 63 year-old female who is s/p L2-3 XLIF/ PSF on 05/24/22. Reports ongoing PT, however she feels she could be stronger. She is now able to stand at a counter and lift her heels. Ambulating without difficulty. Reports her feet still flop when she walks. Able to ambulate approximately 0.75 miles daily. Ambulates with walking stick. Does not report any pain today.    Physical Examination   VITAL SIGNS:  Visit Vitals  BP 167/71   Pulse 99   Resp 18   Ht 1.562 m (5' 1.5")   Wt 56.4 kg (124 lb 6.4 oz)   BMI 23.12 kg/m          Awake, alert, oriented x3, Follows commands  GCS: 15   Speech is clear  Attention span and concentration: intact  Recent and remote memory: intact    Gait Intact, ambulates with walking stick for support    Point tenderness: None     Motor Exam:   R L   Deltoid    5 5  Tricep    5 5  Bicep    5 5  Wrist Extension  5 5  Wrest Flexion   5 5  Finger Abduction  5 5  Grip    5 5    L2-3     Iliopsoas (Hip Flexion)                        5          5  L3-4     Quadriceps (Knee Extension)             5          5  L5-S1   Hamstring (Knee Flexion)                   5          5  L4-5     Tibialis Anterior (Foot Dorsiflexion)    5          4  S1        Gastrocsoleus (Plantar Flexion)          5          5  L5        EHL (Toe Dorsiflexion)                       5          4    Rapid alternating movements intact    Sensation intact throughout     Reflexes:  DTRs symmetric  Clonus: right negative, left negative   Babinski: right negative, left negative   Hoffman's: right  negative, left negative     Review of Systems   A review of systems is otherwise negative for all systems other than what has been indicated in the HPI.     Radiology Interpretation   No results found.    Impression/Plan     Stephanie Castaneda is an 54 year-old female 12 months s/p L2-3 XLIF/PSF. I personally reviewed the x-ray studies of the lumbar spine, demonstrating a stable instrumented fusion without evidence of hardware failure or malalignment. Will refer patient to continue PT course for strengthening. She will follow-up via MyChart if PT extension needed. Discussed maintaining upright posture with sitting, ambulation. She will plan to follow-up in six months with x-ray studies of the lumbar spine with AP/Lateral/Flexion/Extension views.    Follow-up   6 months with XR Lumbar AP/Lateral/Flexion/Extension    Patient personally seen and examined with Rondel Oh ,NP on 06/30/2023 who participated in the visit. The assessment and plan were formulated together  and has been reviewed and updated as needed.    Marlaine Hind, MD

## 2023-07-23 NOTE — Therapy (Signed)
OUTPATIENT PHYSICAL THERAPY THORACOLUMBAR EVALUATION   Patient Name: [name] Trujillo MRN: [id number] DOB:Dec 07, 1940, 82 y.o., female Today's Date: 07/29/2023  END OF SESSION:  PT End of Session - 07/29/23 1147     Visit Number 1    Date for PT Re-Evaluation 09/23/23    Authorization Type [employer]    Authorization Time Period Auth pending    Progress Note Due on Visit 10    PT Start Time 1147    PT Stop Time 1232    PT Time Calculation (min) 45 min    Activity Tolerance Patient tolerated treatment well    Behavior During Therapy WFL for tasks assessed/performed             Past Medical History:  Diagnosis Date   High cholesterol    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   She had ACUTE TAKOTSUBO (STRESS) CARDIOMYOPATHY with elevated Troponin Levels - this would be considered "Demand Ischemia - Demand Infarction" & NOT associated with ACS/CAD.     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  - Cath with NO CAD. ==> RESOLVED: f/u Echo 03/2022: EF 60-65%.   Past Surgical History:  Procedure Laterality Date   APPENDECTOMY     29-18 yo   BACK SURGERY     Age 63   CESAREAN SECTION     ECTOPIC PREGNANCY SURGERY     LAPAROSCOPIC HYSTERECTOMY     LEFT HEART CATH AND CORONARY ANGIOGRAPHY N/A 01/09/2022   Procedure: LEFT HEART CATH AND CORONARY ANGIOGRAPHY;  Surgeon: Tonny Bollman, MD;  Location: [facility] INVASIVE CV LAB;  Service: CV:: Widely patent coronaries with mild nonobstructive LAD plaquing.  Right dominant system.  Normal LVEDP.  Based on clinical presentation, findings are consistent with acute Takotsubo Cardiomyopathy Syndrome   POSTERIOR FUSION LUMBAR SPINE  05/24/2022    [facility], [place]; [name], MD): L2-L3 XLIF, L2-L3 POSTERIOR DECOMPRESSION AND FUSION   TRANSTHORACIC ECHOCARDIOGRAM  01/08/2022   Severely decreased LV function-EF 25 to 30%.  Mid to apical (mostly anterior) with normal basal motion.  GR 2 DD-elevated LAP.  Mildly dilated LA.  Aortic sclerosis with no stenosis.  No AI.  Normal MV with mild to moderate TR.  Mildly elevated RAP, and PAP (estimated 49 mg). If LAD CAD ruled out - consistent with Takotsubo CM Syndrome.   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    PCP: Mosetta Putt, MD   REFERRING PROVIDER: Rondel Oh, NP   REFERRING DIAG: Z98.1 (ICD-10-CM) - S/p lumbar spinal fusion  Eval and treat for: s/p lumbar fusion, lower extremity strengthening  THERAPY DIAG:  Muscle weakness (generalized)  Other abnormalities of gait and mobility  Unsteadiness on feet  Foot  drop, left  Foot drop, right  Radiculopathy, lumbar region  RATIONALE FOR EVALUATION AND TREATMENT: Rehabilitation  ONSET DATE: 05/24/2022 - L2-L3 XLIF/PSF   NEXT MD VISIT: 12/31/2023 with Marlaine Hind, MD (Surgeon)   SUBJECTIVE:                                                                                                                                                                                                         SUBJECTIVE STATEMENT: Pt reports when we finished our last PT episode she felt like she had reached a  plateau but now she notes improving awareness of ability to engage/activate her muscles but MD feels like she has core weakness that would benefit from PT.  She feels like she has more potential for improvement now.  She still notes stiffness more than pain or numbness in her LEs, R>L.  Her feet still want to slap when she walks. She would also like to work on her balance as she feels very unsteady when in a crowd of people.  PAIN: Are you having pain? No  PERTINENT HISTORY:  L2-3 XLIF/PSF 05/24/22; C5-7 ACDF 09/21/20; idiopathic progressive neuropathy; hypthyroidism; DDD; remote h/o back surgery at age 3; h/o scoliosis; Takotsubo cardiomyopathy 01/10/22   PRECAUTIONS: Fall  RED FLAGS: Bowel or bladder incontinence: Yes: chronic bladder issues, better bowel control since surgery  WEIGHT BEARING RESTRICTIONS: No  FALLS:  Has patient fallen in last 6 months? No  LIVING ENVIRONMENT: Lives with: lives with their spouse Lives in: House/apartment Stairs: Yes: External: 5-6 steps; on left going up Has following equipment at home: Environmental consultant - 2 wheeled, shower chair, and hiking/walking poles  OCCUPATION: Retired Runner, broadcasting/film/video   PLOF: Independent and Leisure: walking ~30 minutes 1x/day (3x around the court near where she lives); sewing/quilting; likes working in the yard but has not been able to do so due to her balance      PATIENT GOALS: "Improved balance (to feel more comfortable in a crowd and walk more normally)."   OBJECTIVE: (objective measures completed at initial evaluation unless otherwise dated)  DIAGNOSTIC FINDINGS:  06/30/23 - XR Lumbar spine: Stable postoperative changes of the lumbar spine with evidence of spinal fusion at L2-L3.   07/01/22 - Lumbar spine x-ray: Expected postoperative appearance related to L2-L3 fixation.    06/12/21 - Lumbar MRI: Postoperative and degenerative changes of the lumbar spine as described, similar compared to July 28, 2020. At L2-L3, there is moderate  spinal canal stenosis due to disc bulging, endplate osteophytic spurring, facet and ligamentum flavum hypertrophy. Milder spinal canal  stenosis is seen at L3-L4. There is multilevel neural foraminal stenosis.   PATIENT SURVEYS:  ABC scale 710 / 1600 = 44.4 % Modified Oswestry 24 / 50 = 48.0 %   SCREENING FOR RED FLAGS: Bowel or bladder incontinence: Yes: chronic bladder issues, better bowel control since surgery Spinal tumors: No Cauda equina syndrome: No Compression fracture: No Abdominal aneurysm: No  COGNITION:  Overall cognitive status: Within functional limits for tasks assessed    SENSATION: Light touch: Impaired - top of R foot Proprioception: Impaired    MUSCLE LENGTH: Hamstrings: mild/mod tight L>R ITB: mild tight B Piriformis: mod tight L>R Hip IR: mod tight B Hip flexors: WFL Quads: mild tight R>L Heelcord: mild/mod tight B  POSTURE:  rounded shoulders, forward head, right pelvic obliquity, and scoliosis  LUMBAR ROM: *ROM assessment limited in standing due to balance impairments  Active  Eval *  Flexion Hands to mid shin  Extension 50% limited  Right lateral flexion Hand to mid thigh  Left lateral flexion Hand to mid thigh  Right rotation 50% limited  Left rotation 50% limited  (Blank rows = not tested)  LOWER EXTREMITY ROM:    Grossly WFL other than limitations indicated above due to muscle tightness  LOWER EXTREMITY MMT:    MMT Right eval Left eval  Hip flexion 3 3  Hip extension 4 4  Hip abduction 4- 4-  Hip adduction 3+ 3+  Hip internal rotation 4- 3+  Hip external rotation 3 3  Knee flexion 5 5  Knee extension 4+ 4+  Ankle dorsiflexion 3 3-  Ankle plantarflexion 2 2  Ankle inversion 3 3-  Ankle eversion 4- 3+   (Blank rows = not tested)  FUNCTIONAL TESTS: (To be completed next visit) 5 times sit to stand: 11.50 sec Timed up and go (TUG):   10 meter walk test: 14.22 sec with single walking/trekking pole; Gait speed = 2.31 ft/sec Berg  Balance Scale:   Functional gait assessment:    Standardized testing results as of discharge from latest PT episode (12/31/22): 5xSTS: 10.72 sec Berg: 48/56; 46-51 moderate risk for falls (>50%)  : 16.15 sec w/o AD Gait speed: 2.03 ft/sec FGA: 19/30; 19-24 = medium risk fall   GAIT: Distance walked: Clinic distances Assistive device utilized:  Single walking/trekking pole on R Level of assistance: SBA Gait pattern:  Increased sway, step through pattern, decreased stride length, trendelenburg, lateral hip instability, poor foot clearance- Right, and poor foot clearance- Left Comments: L>R foot slap   TODAY'S TREATMENT:   07/29/23 - Eval only   PATIENT EDUCATION:  Education details: PT eval findings, anticipated POC, and need for further assessment of standardized balance testing Person educated: Patient Education method: Explanation Education comprehension: verbalized understanding  HOME EXERCISE PROGRAM: TBD  HEP from most recent PT episode: Access Code: [email or web address] URL: [email or web address] Date: 11/28/2022 Prepared by: Glenetta Hew   Exercises - Seated Heel Toe Raises  - 1 x daily - 7 x weekly - 2 sets - 10 reps - 3 sec hold - Standing Isometric Hip Abduction with Ball on Wall  - 1 x daily - 7 x weekly - 2 sets - 10 reps - 3 sec hold - Seated Transversus Abdominis Bracing  - 2 x daily - 7 x weekly - 2 sets - 10 reps - 3-5 sec hold - Seated Isometric Hip Abduction with Resistance  - 1 x daily - 3 x weekly - 2 sets - 10 reps - 3 sec hold - Seated  March with Resistance  - 1 x daily - 3 x weekly - 2 sets - 10 reps - 3 sec hold - Seated Shoulder Row with Anchored Resistance  - 1 x daily - 3 x weekly - 2 sets - 10 reps - 3-5 hold hold - Seated Single Arm Shoulder Row with Anchored Resistance  - 1 x daily - 3 x weekly - 2 sets - 10 reps - 3 sec hold - Seated Anti-Rotation Press With Anchored Resistance  - 1 x daily - 3 x weekly - 2 sets - 10 reps - 3 sec  hold - Tall Kneeling Hip Hinge  - 1 x daily - 3-4 x weekly - 2 sets - 10 reps - 3 sec hold   Quadruped PWR! Up & Step   ASSESSMENT:  CLINICAL IMPRESSION: [name] Trujillo is a 82 y.o. female who was referred to physical therapy for evaluation and treatment for core and lower extremity strengthening s/p lumbar fusion (L2-3 XLIF/PSF on 05/24/22).  She is well-known to this therapist having completed multiple prior PT episodes s/p cervical and lumbar fusions as well as for balance related deficits related to peripheral neuropathy and impaired motor control in B LE.  She feels like she has declined from her status at discharge from her most recent PT episode in April of this year, noting core and LE weakness creating feeling of increased instability and loss of balance making her more unsteady when walking in public, although she denies any recent falls.  She reports minimal to no back or LE radicular pain and notes improvement in her LE sensation since the fusion, but notes a sensation of increased muscle stiffness/tightness in her LEs along with core and L>R LE weakness contributing to her unsteady gait.  Current deficits include limited lumbar ROM, B LE weakness with impaired coordination, balance deficits and abnormal gait pattern with significant gait instability and decreased gait speed of 2.31 ft/sec indicative of a limited community ambulator.  Formal standardized balance testing pending next visit but anticipate scores will reflect a high fall risk based on mobility observed during assessment completed today.  [name] Trujillo will benefit from skilled PT to address above deficits to improve mobility and activity tolerance with improved stability and balance to reduce fall risk.   OBJECTIVE IMPAIRMENTS: Abnormal gait, decreased activity tolerance, decreased balance, decreased coordination, decreased endurance, decreased mobility, difficulty walking, decreased ROM, decreased strength, decreased safety awareness,  increased fascial restrictions, impaired perceived functional ability, increased muscle spasms, impaired flexibility, impaired sensation, impaired tone, improper body mechanics, and postural dysfunction.   ACTIVITY LIMITATIONS: carrying, lifting, bending, sitting, standing, squatting, stairs, transfers, bed mobility, bathing, toileting, dressing, reach over head, hygiene/grooming, locomotion level, and caring for others  PARTICIPATION LIMITATIONS: meal prep, cleaning, laundry, driving, shopping, community activity, yard work, and church  PERSONAL FACTORS: Age, Past/current experiences, Time since onset of injury/illness/exacerbation, and 3+ comorbidities: L2-3 XLIF/PSF 05/24/22; C5-7 ACDF 09/21/20; idiopathic progressive neuropathy; hypthyroidism; DDD; remote h/o back surgery at age 60; h/o scoliosis; Takotsubo cardiomyopathy 01/10/22  are also affecting patient's functional outcome.   REHAB POTENTIAL: Good  CLINICAL DECISION MAKING: Evolving/moderate complexity  EVALUATION COMPLEXITY: Moderate   GOALS: Goals reviewed with patient? Yes  SHORT TERM GOALS: Target date: 08/26/2023  Patient will be independent with initial HEP to improve outcomes and carryover.  Baseline:  Goal status: INITIAL  2.  Complete balance assessment and update LTG's as indicated.  Baseline:  Goal status: INITIAL  LONG TERM GOALS: Target date: 09/23/2023  Patient will be  independent with ongoing/advanced HEP for self-management at home.  Baseline:  Goal status: INITIAL  2.  Patient to demonstrate ability to achieve and maintain good spinal alignment/posturing and body mechanics needed for daily activities. Baseline:  Goal status: INITIAL  3.  Patient will demonstrate functional pain free lumbar ROM to perform ADLs.   Baseline: Refer to above lumbar ROM table Goal status: INITIAL  4.  Patient will demonstrate improved B proximal LE strength to >/= 4 to 4+/5 for improved stability and ease of mobility  . Baseline: Refer to above LE MMT table Goal status: INITIAL  5.  Patient will demonstrate improved B ankle strength to >/= 3+ to 4-/5 for improved gait stability with decreased foot slap.  Baseline: Refer to above LE MMT table Goal status: INITIAL   6.  Patient will report >/= 60% on ABC scale to demonstrate improved balance confidence.  Baseline: 710 / 1600 = 44.4 % Goal status: INITIAL   7. Patient will report </= 36% on Modified Oswestry to demonstrate improved functional ability with decreased pain interference. Baseline: 24 / 50 = 48.0 % Goal status: INITIAL  8.  Patient will improve gait velocity to at least 2.62 ft/sec for improved gait efficiency and safety with community ambulation. Baseline: 2.31 ft/sec with single walking/trekking pole on R Goal status: INITIAL  9.  Patient will improve Berg score to >/= __/56 (8 points) to improve safety and stability with ADLs in standing and reduce risk for falls.  Baseline: TBD Goal status: INITIAL  10.  Patient will improve FGA score to >/= __/30 (4 points) to improve gait stability and reduce risk for falls.  Baseline: TBD Goal status: INITIAL    PLAN:  PT FREQUENCY: 2x/week  PT DURATION: 8 weeks  PLANNED INTERVENTIONS: 97164- PT Re-evaluation, 97110-Therapeutic exercises, 97530- Therapeutic activity, 97112- Neuromuscular re-education, 97535- Self Care, [id number]- Manual therapy, [id number]- Gait training, 97014- Electrical stimulation (unattended), [phone]- Electrical stimulation (manual), [phone]- Ionotophoresis 4mg /ml Dexamethasone, Patient/Family education, Balance training, Stair training, Taping, Dry Needling, Joint mobilization, DME instructions, Cryotherapy, and Moist heat  PLAN FOR NEXT SESSION: Complete standardized balance assessment - TUG, Berg and DGI vs FGA; review HEP from prior PT episode and update/modify as indicated; LE strengthening; core stabilization; balance training and gait stability    Marry Guan,  PT 07/29/2023, 7:40 PM

## 2023-07-29 ENCOUNTER — Other Ambulatory Visit: Payer: Self-pay

## 2023-07-29 ENCOUNTER — Ambulatory Visit: Payer: Medicare PPO | Attending: Registered" | Admitting: Physical Therapy

## 2023-07-29 ENCOUNTER — Encounter: Payer: Self-pay | Admitting: Physical Therapy

## 2023-07-29 DIAGNOSIS — M6281 Muscle weakness (generalized): Secondary | ICD-10-CM | POA: Diagnosis present

## 2023-07-29 DIAGNOSIS — R2681 Unsteadiness on feet: Secondary | ICD-10-CM | POA: Insufficient documentation

## 2023-07-29 DIAGNOSIS — M21371 Foot drop, right foot: Secondary | ICD-10-CM | POA: Diagnosis present

## 2023-07-29 DIAGNOSIS — M5416 Radiculopathy, lumbar region: Secondary | ICD-10-CM | POA: Diagnosis present

## 2023-07-29 DIAGNOSIS — M21372 Foot drop, left foot: Secondary | ICD-10-CM | POA: Insufficient documentation

## 2023-07-29 DIAGNOSIS — R2689 Other abnormalities of gait and mobility: Secondary | ICD-10-CM | POA: Insufficient documentation

## 2023-08-12 ENCOUNTER — Encounter: Payer: Self-pay | Admitting: Physical Therapy

## 2023-08-12 ENCOUNTER — Ambulatory Visit: Payer: Medicare PPO | Attending: Registered" | Admitting: Physical Therapy

## 2023-08-12 DIAGNOSIS — R2689 Other abnormalities of gait and mobility: Secondary | ICD-10-CM

## 2023-08-12 DIAGNOSIS — M21371 Foot drop, right foot: Secondary | ICD-10-CM | POA: Diagnosis present

## 2023-08-12 DIAGNOSIS — M21372 Foot drop, left foot: Secondary | ICD-10-CM

## 2023-08-12 DIAGNOSIS — M5416 Radiculopathy, lumbar region: Secondary | ICD-10-CM | POA: Diagnosis present

## 2023-08-12 DIAGNOSIS — M6281 Muscle weakness (generalized): Secondary | ICD-10-CM

## 2023-08-12 DIAGNOSIS — R2681 Unsteadiness on feet: Secondary | ICD-10-CM

## 2023-08-12 NOTE — Therapy (Unsigned)
OUTPATIENT PHYSICAL THERAPY TREATMENT   Patient Name: [name] Trujillo MRN: [id number] DOB:1941/05/01, 82 y.o., female Today's Date: 08/12/2023  END OF SESSION:  PT End of Session - 08/12/23 1402     Visit Number 2    Date for PT Re-Evaluation 09/23/23    Authorization Type [employer]    Authorization Time Period Auth pending    Progress Note Due on Visit 10    PT Start Time 1402    PT Stop Time 1450    PT Time Calculation (min) 48 min    Activity Tolerance Patient tolerated treatment well    Behavior During Therapy WFL for tasks assessed/performed              Past Medical History:  Diagnosis Date   High cholesterol    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   She had ACUTE TAKOTSUBO (STRESS) CARDIOMYOPATHY with elevated Troponin Levels - this would be considered "Demand Ischemia - Demand Infarction" & NOT associated with ACS/CAD.     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  - Cath with NO CAD. ==> RESOLVED: f/u Echo 03/2022: EF 60-65%.   Past Surgical History:  Procedure Laterality Date   APPENDECTOMY     49-18 yo   BACK SURGERY     Age 76   CESAREAN SECTION     ECTOPIC PREGNANCY SURGERY     LAPAROSCOPIC HYSTERECTOMY     LEFT HEART CATH AND CORONARY ANGIOGRAPHY N/A 01/09/2022   Procedure: LEFT HEART CATH AND CORONARY ANGIOGRAPHY;  Surgeon: Tonny Bollman, MD;  Location: [facility] INVASIVE CV LAB;  Service: CV:: Widely patent coronaries with mild nonobstructive LAD plaquing.  Right dominant system.  Normal LVEDP.  Based on clinical presentation, findings are consistent with acute Takotsubo Cardiomyopathy Syndrome   POSTERIOR FUSION LUMBAR SPINE  05/24/2022   [facility], [place]; [name], MD): L2-L3 XLIF, L2-L3 POSTERIOR DECOMPRESSION AND FUSION   TRANSTHORACIC ECHOCARDIOGRAM  01/08/2022   Severely decreased LV function-EF 25 to 30%.  Mid to apical (mostly anterior) with normal basal motion.  GR 2 DD-elevated LAP.  Mildly dilated LA.  Aortic sclerosis with no stenosis.  No AI.  Normal MV with mild to moderate TR.  Mildly elevated RAP, and PAP (estimated 49 mg). If LAD CAD ruled out - consistent with Takotsubo CM Syndrome.   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    PCP: Mosetta Putt, MD   REFERRING PROVIDER: Rondel Oh, NP   REFERRING DIAG: Z98.1 (ICD-10-CM) - S/p lumbar spinal fusion  Eval and treat for: s/p lumbar fusion, lower extremity strengthening  THERAPY DIAG:  Muscle weakness (generalized)  Other abnormalities of gait and mobility  Unsteadiness on feet  Foot  drop, left  Foot drop, right  Radiculopathy, lumbar region  RATIONALE FOR EVALUATION AND TREATMENT: Rehabilitation  ONSET DATE: 05/24/2022 - L2-L3 XLIF/PSF   NEXT MD VISIT: 12/31/2023 with Marlaine Hind, MD (Surgeon)   SUBJECTIVE:                                                                                                                                                                                                         SUBJECTIVE STATEMENT: Pt states she feels like she has been declining more rapidly over the past few weeks and  finds herself having to rely more on external support for balance such as leaning into the counter.  PAIN: Are you having pain? No  PERTINENT HISTORY:  L2-3 XLIF/PSF 05/24/22; C5-7 ACDF 09/21/20; idiopathic progressive neuropathy; hypthyroidism; DDD; remote h/o back surgery at age 43; h/o scoliosis; Takotsubo cardiomyopathy 01/10/22   PRECAUTIONS: Fall  RED FLAGS: Bowel or bladder incontinence: Yes: chronic bladder issues, better bowel control since surgery  WEIGHT BEARING RESTRICTIONS: No  FALLS:  Has patient fallen in last 6 months? No  LIVING ENVIRONMENT: Lives with: lives with their spouse Lives in: House/apartment Stairs: Yes: External: 5-6 steps; on left going up Has following equipment at home: Environmental consultant - 2 wheeled, shower chair, and hiking/walking poles  OCCUPATION: Retired Runner, broadcasting/film/video   PLOF: Independent and Leisure: walking ~30 minutes 1x/day (3x around the court near where she lives); sewing/quilting; likes working in the yard but has not been able to do so due to her balance      PATIENT GOALS: "Improved balance (to feel more comfortable in a crowd and walk more normally)."   OBJECTIVE: (objective measures completed at initial evaluation unless otherwise dated)  DIAGNOSTIC FINDINGS:  06/30/23 - XR Lumbar spine: Stable postoperative changes of the lumbar spine with evidence of spinal fusion at L2-L3.   07/01/22 - Lumbar spine x-ray: Expected postoperative appearance related to L2-L3 fixation.    06/12/21 - Lumbar MRI: Postoperative and degenerative changes of the lumbar spine as described, similar compared to July 28, 2020. At L2-L3, there is moderate spinal canal stenosis due to disc bulging, endplate osteophytic spurring, facet and ligamentum flavum hypertrophy. Milder spinal canal stenosis is seen at L3-L4. There is multilevel neural foraminal stenosis.   PATIENT SURVEYS:  ABC scale 710 / 1600 = 44.4 % Modified Oswestry 24 / 50 = 48.0 %   SCREENING FOR RED  FLAGS: Bowel or bladder incontinence: Yes: chronic bladder issues, better bowel control since surgery Spinal tumors: No Cauda equina syndrome: No Compression fracture: No Abdominal aneurysm:  No  COGNITION:  Overall cognitive status: Within functional limits for tasks assessed    SENSATION: Light touch: Impaired - top of R foot Proprioception: Impaired    MUSCLE LENGTH: Hamstrings: mild/mod tight L>R ITB: mild tight B Piriformis: mod tight L>R Hip IR: mod tight B Hip flexors: WFL Quads: mild tight R>L Heelcord: mild/mod tight B  POSTURE:  rounded shoulders, forward head, right pelvic obliquity, and scoliosis  LUMBAR ROM: *ROM assessment limited in standing due to balance impairments  Active  Eval *  Flexion Hands to mid shin  Extension 50% limited  Right lateral flexion Hand to mid thigh  Left lateral flexion Hand to mid thigh  Right rotation 50% limited  Left rotation 50% limited  (Blank rows = not tested)  LOWER EXTREMITY ROM:    Grossly WFL other than limitations indicated above due to muscle tightness  LOWER EXTREMITY MMT:    MMT Right eval Left eval  Hip flexion 3 3  Hip extension 4 4  Hip abduction 4- 4-  Hip adduction 3+ 3+  Hip internal rotation 4- 3+  Hip external rotation 3 3  Knee flexion 5 5  Knee extension 4+ 4+  Ankle dorsiflexion 3 3-  Ankle plantarflexion 2 2  Ankle inversion 3 3-  Ankle eversion 4- 3+   (Blank rows = not tested)  FUNCTIONAL TESTS:  5 times sit to stand: 11.50 sec Timed up and go (TUG): 14.00 sec with single hiking pole; 12.25 sec w/o AD - 08/12/23 10 meter walk test: 14.22 sec with single walking/trekking pole; Gait speed = 2.31 ft/sec Berg Balance Scale: 31/56; < 36 high risk for falls (close to 100%) - 08/12/23 Functional gait assessment: 11/30; < 19 = high risk fall - 08/12/23   Standardized testing results as of discharge from latest PT episode (12/31/22): 5xSTS: 10.72 sec Berg: 48/56; 46-51 moderate risk for falls  (>50%)  : 16.15 sec w/o AD Gait speed: 2.03 ft/sec FGA: 19/30; 19-24 = medium risk fall   GAIT: Distance walked: Clinic distances Assistive device utilized:  Single walking/trekking pole on R Level of assistance: SBA Gait pattern:  Increased sway, step through pattern, decreased stride length, trendelenburg, lateral hip instability, poor foot clearance- Right, and poor foot clearance- Left Comments: L>R foot slap   TODAY'S TREATMENT:   08/12/23 THERAPEUTIC ACTIVITIES: TUG = 14.00 sec with single hiking pole; 12.25 sec w/o AD Berg = 31/56; < 36 high risk for falls (close to 100%)  FGA = 11/30; < 19 = high risk fall     07/29/23 - Eval only   PATIENT EDUCATION:  Education details: PT eval findings and anticipated POC Person educated: Patient Education method: Explanation Education comprehension: verbalized understanding  HOME EXERCISE PROGRAM: TBD  HEP from most recent PT episode: Access Code: [email or web address] URL: [email or web address] Date: 11/28/2022 Prepared by: Glenetta Hew   Exercises - Seated Heel Toe Raises  - 1 x daily - 7 x weekly - 2 sets - 10 reps - 3 sec hold - Standing Isometric Hip Abduction with Ball on Wall  - 1 x daily - 7 x weekly - 2 sets - 10 reps - 3 sec hold - Seated Transversus Abdominis Bracing  - 2 x daily - 7 x weekly - 2 sets - 10 reps - 3-5 sec hold - Seated Isometric Hip Abduction with Resistance  - 1 x daily - 3 x weekly - 2 sets - 10 reps - 3 sec hold - Seated March with Resistance  - 1  x daily - 3 x weekly - 2 sets - 10 reps - 3 sec hold - Seated Shoulder Row with Anchored Resistance  - 1 x daily - 3 x weekly - 2 sets - 10 reps - 3-5 hold hold - Seated Single Arm Shoulder Row with Anchored Resistance  - 1 x daily - 3 x weekly - 2 sets - 10 reps - 3 sec hold - Seated Anti-Rotation Press With Anchored Resistance  - 1 x daily - 3 x weekly - 2 sets - 10 reps - 3 sec hold - Tall Kneeling Hip Hinge  - 1 x daily - 3-4 x weekly - 2  sets - 10 reps - 3 sec hold   Quadruped PWR! Up & Step   ASSESSMENT:  CLINICAL IMPRESSION: Completed standardized balance testing today.  TUG revealing high fall risk when assessed with hiking pole.  TUG time below increased fall risk without assistive device but increased instability noted during performance of test.  Sharlene Motts and FGA demonstrating significant decline from levels upon completion of last PT episode in April of this year, with current scores for both test indicating a high fall risk.  Intermittent rest breaks necessary during testing due to fatigue and patient noting increased use of external support to help maintain her balance during activities at home.  Will plan for review of her current/most recent HEP next visit, providing clarification and modification as needed to address her current deficits.  Arthurine will benefit from continued skilled PT to address above deficits to improve mobility and activity tolerance with improved stability and balance to reduce fall risk.   OBJECTIVE IMPAIRMENTS: Abnormal gait, decreased activity tolerance, decreased balance, decreased coordination, decreased endurance, decreased mobility, difficulty walking, decreased ROM, decreased strength, decreased safety awareness, increased fascial restrictions, impaired perceived functional ability, increased muscle spasms, impaired flexibility, impaired sensation, impaired tone, improper body mechanics, and postural dysfunction.   ACTIVITY LIMITATIONS: carrying, lifting, bending, sitting, standing, squatting, stairs, transfers, bed mobility, bathing, toileting, dressing, reach over head, hygiene/grooming, locomotion level, and caring for others  PARTICIPATION LIMITATIONS: meal prep, cleaning, laundry, driving, shopping, community activity, yard work, and church  PERSONAL FACTORS: Age, Past/current experiences, Time since onset of injury/illness/exacerbation, and 3+ comorbidities: L2-3 XLIF/PSF 05/24/22; C5-7 ACDF  09/21/20; idiopathic progressive neuropathy; hypthyroidism; DDD; remote h/o back surgery at age 39; h/o scoliosis; Takotsubo cardiomyopathy 01/10/22  are also affecting patient's functional outcome.   REHAB POTENTIAL: Good  CLINICAL DECISION MAKING: Evolving/moderate complexity  EVALUATION COMPLEXITY: Moderate   GOALS: Goals reviewed with patient? Yes  SHORT TERM GOALS: Target date: 08/26/2023  Patient will be independent with initial HEP to improve outcomes and carryover.  Baseline:  Goal status: INITIAL  2.  Complete balance assessment and update LTG's as indicated.  Baseline:  Goal status: MET - 08/12/23   LONG TERM GOALS: Target date: 09/23/2023  Patient will be independent with ongoing/advanced HEP for self-management at home.  Baseline:  Goal status: INITIAL  2.  Patient to demonstrate ability to achieve and maintain good spinal alignment/posturing and body mechanics needed for daily activities. Baseline:  Goal status: INITIAL  3.  Patient will demonstrate functional pain free lumbar ROM to perform ADLs.   Baseline: Refer to above lumbar ROM table Goal status: INITIAL  4.  Patient will demonstrate improved B proximal LE strength to >/= 4 to 4+/5 for improved stability and ease of mobility . Baseline: Refer to above LE MMT table Goal status: INITIAL  5.  Patient will demonstrate improved B ankle  strength to >/= 3+ to 4-/5 for improved gait stability with decreased foot slap.  Baseline: Refer to above LE MMT table Goal status: INITIAL   6.  Patient will report >/= 60% on ABC scale to demonstrate improved balance confidence.  Baseline: 710 / 1600 = 44.4 % Goal status: INITIAL   7. Patient will report </= 36% on Modified Oswestry to demonstrate improved functional ability with decreased pain interference. Baseline: 24 / 50 = 48.0 % Goal status: INITIAL  8.  Patient will improve gait velocity to at least 2.62 ft/sec for improved gait efficiency and safety with  community ambulation. Baseline: 2.31 ft/sec with single walking/trekking pole on R Goal status: INITIAL  9.  Patient will improve Berg score to >/= 39/56 (8 points) to improve safety and stability with ADLs in standing and reduce risk for falls.  Baseline: 31/56 Goal status: INITIAL  10.  Patient will improve FGA score to >/= 15/30 (4 points) to improve gait stability and reduce risk for falls.  Baseline: 11/30 Goal status: INITIAL    PLAN:  PT FREQUENCY: 2x/week  PT DURATION: 8 weeks  PLANNED INTERVENTIONS: 97164- PT Re-evaluation, 97110-Therapeutic exercises, 97530- Therapeutic activity, 97112- Neuromuscular re-education, 97535- Self Care, [id number]- Manual therapy, [id number]- Gait training, 97014- Electrical stimulation (unattended), [phone]- Electrical stimulation (manual), [phone]- Ionotophoresis 4mg /ml Dexamethasone, Patient/Family education, Balance training, Stair training, Taping, Dry Needling, Joint mobilization, DME instructions, Cryotherapy, and Moist heat  PLAN FOR NEXT SESSION:  Review HEP from prior PT episode and update/modify as indicated; LE strengthening; core stabilization; balance training and gait stability    Marry Guan, PT 08/12/2023, 8:00 PM

## 2023-08-14 ENCOUNTER — Ambulatory Visit: Payer: Medicare PPO | Admitting: Physical Therapy

## 2023-08-14 ENCOUNTER — Encounter: Payer: Self-pay | Admitting: Physical Therapy

## 2023-08-14 DIAGNOSIS — M6281 Muscle weakness (generalized): Secondary | ICD-10-CM

## 2023-08-14 DIAGNOSIS — R2689 Other abnormalities of gait and mobility: Secondary | ICD-10-CM

## 2023-08-14 DIAGNOSIS — R2681 Unsteadiness on feet: Secondary | ICD-10-CM

## 2023-08-14 DIAGNOSIS — M5416 Radiculopathy, lumbar region: Secondary | ICD-10-CM

## 2023-08-14 DIAGNOSIS — M21372 Foot drop, left foot: Secondary | ICD-10-CM

## 2023-08-14 DIAGNOSIS — M21371 Foot drop, right foot: Secondary | ICD-10-CM

## 2023-08-14 NOTE — Therapy (Signed)
OUTPATIENT PHYSICAL THERAPY TREATMENT   Patient Name: [name] Trujillo MRN: [id number] DOB:26-Feb-1941, 82 y.o., female Today's Date: 08/14/2023  END OF SESSION:  PT End of Session - 08/14/23 1016     Visit Number 3    Date for PT Re-Evaluation 09/23/23    Authorization Type [employer]    Authorization Time Period 07/29/23 - 09/10/23    Authorization - Visit Number 2    Authorization - Number of Visits 8    Progress Note Due on Visit 10    PT Start Time 1016    PT Stop Time 1100    PT Time Calculation (min) 44 min    Activity Tolerance Patient tolerated treatment well    Behavior During Therapy [facility] for tasks assessed/performed               Past Medical History:  Diagnosis Date   High cholesterol    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   She had ACUTE TAKOTSUBO (STRESS) CARDIOMYOPATHY with elevated Troponin Levels - this would be considered "Demand Ischemia - Demand Infarction" & NOT associated with ACS/CAD.     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  - Cath with NO CAD. ==> RESOLVED: f/u Echo 03/2022: EF 60-65%.   Past Surgical History:  Procedure Laterality Date   APPEN[age]CTOMY     78-18 yo   BACK SURGERY     Age 82   CESAREAN SECTION     ECTOPIC PREGNANCY SURGERY     LAPAROSCOPIC HYSTERECTOMY     LEFT HEART CATH AND CORONARY ANGIOGRAPHY N/A 01/09/2022   Procedure: LEFT HEART CATH AND CORONARY ANGIOGRAPHY;  Surgeon: Tonny Bollman, MD;  Location: [facility] INVASIVE CV LAB;  Service: CV:: Widely patent coronaries with mild nonobstructive LAD plaquing.  Right dominant system.  Normal LVEDP.  Based on clinical presentation, findings are consistent with acute Takotsubo  Cardiomyopathy Syndrome   POSTERIOR FUSION LUMBAR SPINE  05/24/2022   [facility], [place]; [name], MD): L2-L3 XLIF, L2-L3 POSTERIOR DECOMPRESSION AND FUSION   TRANSTHORACIC ECHOCARDIOGRAM  01/08/2022   Severely decreased LV function-EF 25 to 30%.  Mid to apical (mostly anterior) with normal basal motion.  GR 2 DD-elevated LAP.  Mildly dilated LA.  Aortic sclerosis with no stenosis.  No AI.  Normal MV with mild to moderate TR.  Mildly elevated RAP, and PAP (estimated 49 mg). If LAD CAD ruled out - consistent with Takotsubo CM Syndrome.   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    PCP: Mosetta Putt, MD   REFERRING PROVIDER: Rondel Oh, NP   REFERRING DIAG: Z98.1 (ICD-10-CM) - S/p lumbar spinal fusion  Eval and treat for: s/p lumbar fusion, lower extremity strengthening  THERAPY DIAG:  Muscle weakness (generalized)  Other abnormalities of gait and mobility  Unsteadiness on feet  Foot drop, left  Foot drop, right  Radiculopathy, lumbar region  RATIONALE FOR EVALUATION AND TREATMENT: Rehabilitation  ONSET DATE: 05/24/2022 - L2-L3 XLIF/PSF   NEXT MD VISIT: 12/31/2023 with Marlaine Hind, MD (Surgeon)   SUBJECTIVE:                                                                                                                                                                                                         SUBJECTIVE STATEMENT: Pt states  she feels like she has been declining more rapidly over the past few weeks and finds herself having to rely more on external support for balance such as leaning into the counter.  PAIN: Are you having pain? No  PERTINENT HISTORY:  L2-3 XLIF/PSF 05/24/22; C5-7 ACDF 09/21/20; idiopathic progressive neuropathy; hypthyroidism; DDD; remote h/o back surgery at age 16; h/o scoliosis; Takotsubo cardiomyopathy 01/10/22   PRECAUTIONS: Fall  RED FLAGS: Bowel or bladder incontinence: Yes: chronic bladder issues, better bowel control since surgery  WEIGHT BEARING RESTRICTIONS: No  FALLS:  Has patient fallen in last 6 months? No  LIVING ENVIRONMENT: Lives with: lives with their spouse Lives in: House/apartment Stairs: Yes: External: 5-6 steps; on left going up Has following equipment at home: Environmental consultant - 2 wheeled, shower chair, and hiking/walking poles  OCCUPATION: Retired Runner, broadcasting/film/video   PLOF: Independent and Leisure: walking ~30 minutes 1x/day (3x around the court near where she lives); sewing/quilting; likes working in the yard but has not been able to do so due to her balance      PATIENT GOALS: "Improved balance (to feel more comfortable in a crowd and walk more normally)."   OBJECTIVE: (objective measures completed at initial evaluation unless otherwise dated)  DIAGNOSTIC FINDINGS:  06/30/23 - XR Lumbar spine: Stable postoperative changes of the lumbar spine with evidence of spinal fusion at L2-L3.   07/01/22 - Lumbar spine x-ray: Expected postoperative appearance related to L2-L3 fixation.    06/12/21 - Lumbar MRI: Postoperative and degenerative changes of the lumbar spine as described, similar compared to July 28, 2020. At L2-L3, there is moderate spinal canal stenosis due to disc bulging, endplate osteophytic spurring, facet and ligamentum flavum hypertrophy. Milder spinal canal stenosis is seen at L3-L4. There is multilevel neural foraminal stenosis.   PATIENT SURVEYS:  ABC scale 710 /  1600 = 44.4 % Modified Oswestry 24 / 50 = 48.0 %   SCREENING FOR RED FLAGS: Bowel or bladder incontinence: Yes: chronic  bladder issues, better bowel control since surgery Spinal tumors: No Cauda equina syndrome: No Compression fracture: No Abdominal aneurysm: No  COGNITION:  Overall cognitive status: Within functional limits for tasks assessed    SENSATION: Light touch: Impaired - top of R foot Proprioception: Impaired    MUSCLE LENGTH: Hamstrings: mild/mod tight L>R ITB: mild tight B Piriformis: mod tight L>R Hip IR: mod tight B Hip flexors: WFL Quads: mild tight R>L Heelcord: mild/mod tight B  POSTURE:  rounded shoulders, forward head, right pelvic obliquity, and scoliosis  LUMBAR ROM: *ROM assessment limited in standing due to balance impairments  Active  Eval *  Flexion Hands to mid shin  Extension 50% limited  Right lateral flexion Hand to mid thigh  Left lateral flexion Hand to mid thigh  Right rotation 50% limited  Left rotation 50% limited  (Blank rows = not tested)  LOWER EXTREMITY ROM:    Grossly WFL other than limitations indicated above due to muscle tightness  LOWER EXTREMITY MMT:    MMT Right eval Left eval  Hip flexion 3 3  Hip extension 4 4  Hip abduction 4- 4-  Hip adduction 3+ 3+  Hip internal rotation 4- 3+  Hip external rotation 3 3  Knee flexion 5 5  Knee extension 4+ 4+  Ankle dorsiflexion 3 3-  Ankle plantarflexion 2 2  Ankle inversion 3 3-  Ankle eversion 4- 3+   (Blank rows = not tested)  FUNCTIONAL TESTS:  5 times sit to stand: 11.50 sec Timed up and go (TUG): 14.00 sec with single hiking pole; 12.25 sec w/o AD - 08/12/23 10 meter walk test: 14.22 sec with single walking/trekking pole; Gait speed = 2.31 ft/sec Berg Balance Scale: 31/56; < 36 high risk for falls (close to 100%) - 08/12/23 Functional gait assessment: 11/30; < 19 = high risk fall - 08/12/23   Standardized testing results as of discharge from latest PT episode  (12/31/22): 5xSTS: 10.72 sec Berg: 48/56; 46-51 moderate risk for falls (>50%)  : 16.15 sec w/o AD Gait speed: 2.03 ft/sec FGA: 19/30; 19-24 = medium risk fall   GAIT: Distance walked: Clinic distances Assistive device utilized:  Single walking/trekking pole on R Level of assistance: SBA Gait pattern:  Increased sway, step through pattern, decreased stride length, trendelenburg, lateral hip instability, poor foot clearance- Right, and poor foot clearance- Left Comments: L>R foot slap   TODAY'S TREATMENT:   08/14/23 THERAPEUTIC EXERCISE: to improve flexibility, strength and mobility.  Verbal and tactile cues throughout for technique.  Rec bike - L2 x 6 min (seat #2) Standing runner's stretch gastroc stretch at wall 2 x 30" bil Standing gastroc stretch at wall with forefoot on 2" block 2 x 30" bil Standing lunge position hip flexor stretch at wall 2 x 30" bil Mod thomas hip flexor stretch with strap 2 x 30" bil Hooklying HS stretch with strap 2 x 30" bil Supine cross-body ITB/glute stretch with strap 2 x 30" bil Hooklying KTOS piriformis stretch x 30" - pt not feeling much of a stretch Hooklying figure-4 piriformis stretch with overpressure 2 x 30" bil - better stretch   08/12/23 THERAPEUTIC ACTIVITIES: TUG = 14.00 sec with single hiking pole; 12.25 sec w/o AD Berg = 31/56; < 36 high risk for falls (close to 100%)  FGA = 11/30; < 19 = high risk fall     07/29/23 - Eval only   PATIENT EDUCATION:  Education details: initial HEP Person educated: Patient Education method: Programmer, multimedia, Facilities manager, Actor  cues, Verbal cues, and Handouts Education comprehension: verbalized understanding, returned demonstration, verbal cues required, tactile cues required, and needs further education  HOME EXERCISE PROGRAM: Access Code: [email or web address] URL: [email or web address] Date: 08/14/2023 Prepared by: Glenetta Hew  Exercises - Standing Gastroc Stretch at Counter  - 1-2 x  daily - 7 x weekly - 3 reps - 30 sec hold - Standing Gastroc Stretch on Foam 1/2 Roll  - 1-2 x daily - 7 x weekly - 3 reps - 30 sec hold - Standing Hip Flexor Stretch  - 1-2 x daily - 7 x weekly - 3 reps - 30 sec hold - Hip Flexor Stretch with Strap on Table  - 1-2 x daily - 7 x weekly - 3 reps - 30 sec hold - Hooklying Hamstring Stretch with Strap  - 1-2 x daily - 7 x weekly - 3 reps - 30 sec hold - Supine Iliotibial Band Stretch with Strap  - 1-2 x daily - 7 x weekly - 3 reps - 30 sec hold - Supine Figure 4 Piriformis Stretch  - 1-2 x daily - 7 x weekly - 3 reps - 30 sec hold   HEP from most recent PT episode: Access Code: [email or web address] URL: [email or web address] Date: 11/28/2022 Prepared by: Glenetta Hew   Exercises - Seated Heel Toe Raises  - 1 x daily - 7 x weekly - 2 sets - 10 reps - 3 sec hold - Standing Isometric Hip Abduction with Ball on Wall  - 1 x daily - 7 x weekly - 2 sets - 10 reps - 3 sec hold - Seated Transversus Abdominis Bracing  - 2 x daily - 7 x weekly - 2 sets - 10 reps - 3-5 sec hold - Seated Isometric Hip Abduction with Resistance  - 1 x daily - 3 x weekly - 2 sets - 10 reps - 3 sec hold - Seated March with Resistance  - 1 x daily - 3 x weekly - 2 sets - 10 reps - 3 sec hold - Seated Shoulder Row with Anchored Resistance  - 1 x daily - 3 x weekly - 2 sets - 10 reps - 3-5 hold hold - Seated Single Arm Shoulder Row with Anchored Resistance  - 1 x daily - 3 x weekly - 2 sets - 10 reps - 3 sec hold - Seated Anti-Rotation Press With Anchored Resistance  - 1 x daily - 3 x weekly - 2 sets - 10 reps - 3 sec hold - Tall Kneeling Hip Hinge  - 1 x daily - 3-4 x weekly - 2 sets - 10 reps - 3 sec hold   Quadruped PWR! Up & Step   ASSESSMENT:  CLINICAL IMPRESSION: Holiday notes she has been feeling more tightness of late and therefore initiated HEP review and update focusing on stretching for improved flexibility.  Various options attempted for some stretches with  HEP created based on patient report of best stretch.  Informed patient to let PT know if further modifications need to be made.  Will plan to progress to initiation of core/lumbopelvic strengthening next visit if no concerns identified with initial HEP.  [name] Trujillo will benefit from continued skilled PT to address above deficits to improve mobility and activity tolerance with improved stability and balance to reduce fall risk.   OBJECTIVE IMPAIRMENTS: Abnormal gait, decreased activity tolerance, decreased balance, decreased coordination, decreased endurance, decreased mobility, difficulty walking, decreased ROM, decreased strength, decreased safety awareness, increased fascial restrictions, impaired perceived functional ability, increased muscle spasms, impaired  flexibility, impaired sensation, impaired tone, improper body mechanics, and postural dysfunction.   ACTIVITY LIMITATIONS: carrying, lifting, bending, sitting, standing, squatting, stairs, transfers, bed mobility, bathing, toileting, dressing, reach over head, hygiene/grooming, locomotion level, and caring for others  PARTICIPATION LIMITATIONS: meal prep, cleaning, laundry, driving, shopping, community activity, yard work, and church  PERSONAL FACTORS: Age, Past/current experiences, Time since onset of injury/illness/exacerbation, and 3+ comorbidities: L2-3 XLIF/PSF 05/24/22; C5-7 ACDF 09/21/20; idiopathic progressive neuropathy; hypthyroidism; DDD; remote h/o back surgery at age 24; h/o scoliosis; Takotsubo cardiomyopathy 01/10/22  are also affecting patient's functional outcome.   REHAB POTENTIAL: Good  CLINICAL DECISION MAKING: Evolving/moderate complexity  EVALUATION COMPLEXITY: Moderate   GOALS: Goals reviewed with patient? Yes  SHORT TERM GOALS: Target date: 08/26/2023  Patient will be independent with initial HEP to improve outcomes and carryover.  Baseline:  Goal status: IN PROGRESS  2.  Complete balance assessment and update LTG's  as indicated.  Baseline:  Goal status: MET - 08/12/23   LONG TERM GOALS: Target date: 09/23/2023  Patient will be independent with ongoing/advanced HEP for self-management at home.  Baseline:  Goal status: IN PROGRESS  2.  Patient to demonstrate ability to achieve and maintain good spinal alignment/posturing and body mechanics needed for daily activities. Baseline:  Goal status: IN PROGRESS  3.  Patient will demonstrate functional pain free lumbar ROM to perform ADLs.   Baseline: Refer to above lumbar ROM table Goal status: IN PROGRESS  4.  Patient will demonstrate improved B proximal LE strength to >/= 4 to 4+/5 for improved stability and ease of mobility . Baseline: Refer to above LE MMT table Goal status: IN PROGRESS  5.  Patient will demonstrate improved B ankle strength to >/= 3+ to 4-/5 for improved gait stability with decreased foot slap.  Baseline: Refer to above LE MMT table Goal status: IN PROGRESS   6.  Patient will report >/= 60% on ABC scale to demonstrate improved balance confidence.  Baseline: 710 / 1600 = 44.4 % Goal status: IN PROGRESS   7. Patient will report </= 36% on Modified Oswestry to demonstrate improved functional ability with decreased pain interference. Baseline: 24 / 50 = 48.0 % Goal status: IN PROGRESS  8.  Patient will improve gait velocity to at least 2.62 ft/sec for improved gait efficiency and safety with community ambulation. Baseline: 2.31 ft/sec with single walking/trekking pole on R Goal status: IN PROGRESS  9.  Patient will improve Berg score to >/= 39/56 (8 points) to improve safety and stability with ADLs in standing and reduce risk for falls.  Baseline: 31/56 Goal status: IN PROGRESS  10.  Patient will improve FGA score to >/= 15/30 (4 points) to improve gait stability and reduce risk for falls.  Baseline: 11/30 Goal status: IN PROGRESS    PLAN:  PT FREQUENCY: 2x/week  PT DURATION: 8 weeks  PLANNED INTERVENTIONS: 97164- PT  Re-evaluation, 97110-Therapeutic exercises, 97530- Therapeutic activity, 97112- Neuromuscular re-education, 97535- Self Care, [id number]- Manual therapy, [id number]- Gait training, 97014- Electrical stimulation (unattended), [phone]- Electrical stimulation (manual), [phone]- Ionotophoresis 4mg /ml Dexamethasone, Patient/Family education, Balance training, Stair training, Taping, Dry Needling, Joint mobilization, DME instructions, Cryotherapy, and Moist heat  PLAN FOR NEXT SESSION:  Review initial HEP for LE stretching; introduce lumbopelvic/LE strengthening and core stabilization, updating HEP accordingly; balance training and gait stability    Marry Guan, PT 08/14/2023, 11:09 AM

## 2023-08-19 ENCOUNTER — Ambulatory Visit: Payer: Medicare PPO | Admitting: Physical Therapy

## 2023-08-19 ENCOUNTER — Encounter: Payer: Self-pay | Admitting: Physical Therapy

## 2023-08-19 DIAGNOSIS — M21371 Foot drop, right foot: Secondary | ICD-10-CM

## 2023-08-19 DIAGNOSIS — M6281 Muscle weakness (generalized): Secondary | ICD-10-CM | POA: Diagnosis not present

## 2023-08-19 DIAGNOSIS — M5416 Radiculopathy, lumbar region: Secondary | ICD-10-CM

## 2023-08-19 DIAGNOSIS — R2689 Other abnormalities of gait and mobility: Secondary | ICD-10-CM

## 2023-08-19 DIAGNOSIS — R2681 Unsteadiness on feet: Secondary | ICD-10-CM

## 2023-08-19 DIAGNOSIS — M21372 Foot drop, left foot: Secondary | ICD-10-CM

## 2023-08-19 NOTE — Therapy (Signed)
OUTPATIENT PHYSICAL THERAPY TREATMENT   Patient Name: [name] Trujillo MRN: [id number] DOB:Jan 12, 1941, 82 y.o., female Today's Date: 08/19/2023  END OF SESSION:  PT End of Session - 08/19/23 1100     Visit Number 4    Date for PT Re-Evaluation 09/23/23    Authorization Type [employer]    Authorization Time Period 07/29/23 - 09/10/23    Authorization - Visit Number 3    Authorization - Number of Visits 8    Progress Note Due on Visit 10    PT Start Time 1100    PT Stop Time 1147    PT Time Calculation (min) 47 min    Activity Tolerance Patient tolerated treatment well    Behavior During Therapy [facility] for tasks assessed/performed                Past Medical History:  Diagnosis Date   High cholesterol    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   She had ACUTE TAKOTSUBO (STRESS) CARDIOMYOPATHY with elevated Troponin Levels - this would be considered "Demand Ischemia - Demand Infarction" & NOT associated with ACS/CAD.     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  - Cath with NO CAD. ==> RESOLVED: f/u Echo 03/2022: EF 60-65%.   Past Surgical History:  Procedure Laterality Date   APPENDECTOMY     34-18 yo   BACK SURGERY     Age 37   CESAREAN SECTION     ECTOPIC PREGNANCY SURGERY     LAPAROSCOPIC HYSTERECTOMY     LEFT HEART CATH AND CORONARY ANGIOGRAPHY N/A 01/09/2022   Procedure: LEFT HEART CATH AND CORONARY ANGIOGRAPHY;  Surgeon: Tonny Bollman, MD;  Location: [facility] INVASIVE CV LAB;  Service: CV:: Widely patent coronaries with mild nonobstructive LAD plaquing.  Right dominant system.  Normal LVEDP.  Based on clinical presentation, findings are consistent with acute Takotsubo  Cardiomyopathy Syndrome   POSTERIOR FUSION LUMBAR SPINE  05/24/2022   [facility], [place]; [name], MD): L2-L3 XLIF, L2-L3 POSTERIOR DECOMPRESSION AND FUSION   TRANSTHORACIC ECHOCARDIOGRAM  01/08/2022   Severely decreased LV function-EF 25 to 30%.  Mid to apical (mostly anterior) with normal basal motion.  GR 2 DD-elevated LAP.  Mildly dilated LA.  Aortic sclerosis with no stenosis.  No AI.  Normal MV with mild to moderate TR.  Mildly elevated RAP, and PAP (estimated 49 mg). If LAD CAD ruled out - consistent with Takotsubo CM Syndrome.   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    PCP: Mosetta Putt, MD   REFERRING PROVIDER: Rondel Oh, NP   REFERRING DIAG: Z98.1 (ICD-10-CM) - S/p lumbar spinal fusion  Eval and treat for: s/p lumbar fusion, lower extremity strengthening  THERAPY DIAG:  Muscle weakness (generalized)  Other abnormalities of gait and mobility  Unsteadiness on feet  Foot drop, left  Foot drop, right  Radiculopathy, lumbar region  RATIONALE FOR EVALUATION AND TREATMENT: Rehabilitation  ONSET DATE: 05/24/2022 - L2-L3 XLIF/PSF   NEXT MD VISIT: 12/31/2023 with Marlaine Hind, MD (Surgeon)   SUBJECTIVE:                                                                                                                                                                                                         SUBJECTIVE STATEMENT: Pt had a few  questions/requests for clarification with the initial HEP, primarily with ITB stretch.  PAIN: Are you having pain? No  PERTINENT HISTORY:  L2-3 XLIF/PSF 05/24/22; C5-7 ACDF 09/21/20; idiopathic progressive neuropathy; hypthyroidism; DDD; remote h/o back surgery at age 59; h/o scoliosis; Takotsubo cardiomyopathy 01/10/22   PRECAUTIONS: Fall  RED FLAGS: Bowel or bladder incontinence: Yes: chronic bladder issues, better bowel control since surgery  WEIGHT BEARING RESTRICTIONS: No  FALLS:  Has patient fallen in last 6 months? No  LIVING ENVIRONMENT: Lives with: lives with their spouse Lives in: House/apartment Stairs: Yes: External: 5-6 steps; on left going up Has following equipment at home: Environmental consultant - 2 wheeled, shower chair, and hiking/walking poles  OCCUPATION: Retired Runner, broadcasting/film/video   PLOF: Independent and Leisure: walking ~30 minutes 1x/day (3x around the court near where she lives); sewing/quilting; likes working in the yard but has not been able to do so due to her balance      PATIENT GOALS: "Improved balance (to feel more comfortable in a crowd and walk more normally)."   OBJECTIVE: (objective measures completed at initial evaluation unless otherwise dated)  DIAGNOSTIC FINDINGS:  06/30/23 - XR Lumbar spine: Stable postoperative changes of the lumbar spine with evidence of spinal fusion at L2-L3.   07/01/22 - Lumbar spine x-ray: Expected postoperative appearance related to L2-L3 fixation.    06/12/21 - Lumbar MRI: Postoperative and degenerative changes of the lumbar spine as described, similar compared to July 28, 2020. At L2-L3, there is moderate spinal canal stenosis due to disc bulging, endplate osteophytic spurring, facet and ligamentum flavum hypertrophy. Milder spinal canal stenosis is seen at L3-L4. There is multilevel neural foraminal stenosis.   PATIENT SURVEYS:  ABC scale 710 / 1600 = 44.4 % Modified Oswestry 24 / 50 = 48.0 %   SCREENING FOR RED FLAGS: Bowel or  bladder incontinence: Yes: chronic bladder issues, better bowel control since surgery Spinal tumors: No Cauda equina syndrome: No Compression fracture: No Abdominal aneurysm:  No  COGNITION:  Overall cognitive status: Within functional limits for tasks assessed    SENSATION: Light touch: Impaired - top of R foot Proprioception: Impaired    MUSCLE LENGTH: Hamstrings: mild/mod tight L>R ITB: mild tight B Piriformis: mod tight L>R Hip IR: mod tight B Hip flexors: WFL Quads: mild tight R>L Heelcord: mild/mod tight B  POSTURE:  rounded shoulders, forward head, right pelvic obliquity, and scoliosis  LUMBAR ROM: *ROM assessment limited in standing due to balance impairments  Active  Eval *  Flexion Hands to mid shin  Extension 50% limited  Right lateral flexion Hand to mid thigh  Left lateral flexion Hand to mid thigh  Right rotation 50% limited  Left rotation 50% limited  (Blank rows = not tested)  LOWER EXTREMITY ROM:    Grossly WFL other than limitations indicated above due to muscle tightness  LOWER EXTREMITY MMT:    MMT Right eval Left eval  Hip flexion 3 3  Hip extension 4 4  Hip abduction 4- 4-  Hip adduction 3+ 3+  Hip internal rotation 4- 3+  Hip external rotation 3 3  Knee flexion 5 5  Knee extension 4+ 4+  Ankle dorsiflexion 3 3-  Ankle plantarflexion 2 2  Ankle inversion 3 3-  Ankle eversion 4- 3+   (Blank rows = not tested)  FUNCTIONAL TESTS:  5 times sit to stand: 11.50 sec Timed up and go (TUG): 14.00 sec with single hiking pole; 12.25 sec w/o AD - 08/12/23 10 meter walk test: 14.22 sec with single walking/trekking pole; Gait speed = 2.31 ft/sec Berg Balance Scale: 31/56; < 36 high risk for falls (close to 100%) - 08/12/23 Functional gait assessment: 11/30; < 19 = high risk fall - 08/12/23   Standardized testing results as of discharge from latest PT episode (12/31/22): 5xSTS: 10.72 sec Berg: 48/56; 46-51 moderate risk for falls (>50%)  :  16.15 sec w/o AD Gait speed: 2.03 ft/sec FGA: 19/30; 19-24 = medium risk fall   GAIT: Distance walked: Clinic distances Assistive device utilized:  Single walking/trekking pole on R Level of assistance: SBA Gait pattern:  Increased sway, step through pattern, decreased stride length, trendelenburg, lateral hip instability, poor foot clearance- Right, and poor foot clearance- Left Comments: L>R foot slap   TODAY'S TREATMENT:   08/19/23 THERAPEUTIC EXERCISE: to improve flexibility, strength and mobility.  Verbal and tactile cues throughout for technique.  Rec bike - L2 x 6 min (seat #2) SLS on 2" step + opp LE 3-way SLR x 10 bil SLS on 2" step + opp LE hip hike x 10 bil Seated RTB hip ABD/ER clam x 10 bil S/L RTB clam x 10 bil Seated RTB march x 10 bil   08/14/23 THERAPEUTIC EXERCISE: to improve flexibility, strength and mobility.  Verbal and tactile cues throughout for technique.  Rec bike - L2 x 6 min (seat #2) Standing runner's stretch gastroc stretch at wall 2 x 30" bil Standing gastroc stretch at wall with forefoot on 2" block 2 x 30" bil Standing lunge position hip flexor stretch at wall 2 x 30" bil Mod thomas hip flexor stretch with strap 2 x 30" bil Hooklying HS stretch with strap 2 x 30" bil Supine cross-body ITB/glute stretch with strap 2 x 30" bil Hooklying KTOS piriformis stretch x 30" - pt not feeling much of a stretch Hooklying figure-4 piriformis stretch with overpressure 2 x 30" bil - better stretch   08/12/23 THERAPEUTIC ACTIVITIES: TUG = 14.00 sec with  single hiking pole; 12.25 sec w/o AD Berg = 31/56; < 36 high risk for falls (close to 100%)  FGA = 11/30; < 19 = high risk fall     PATIENT EDUCATION:  Education details: HEP review and HEP update - core/proximal LE strengthening Person educated: Patient Education method: Explanation, Demonstration, Tactile cues, Verbal cues, and Handouts Education comprehension: verbalized understanding, returned  demonstration, verbal cues required, tactile cues required, and needs further education  HOME EXERCISE PROGRAM: Access Code: [email or web address] URL: [email or web address] Date: 08/19/2023 Prepared by: Glenetta Hew  Exercises - Standing Gastroc Stretch at Counter  - 1-2 x daily - 7 x weekly - 3 reps - 30 sec hold - Standing Gastroc Stretch on Foam 1/2 Roll  - 1-2 x daily - 7 x weekly - 3 reps - 30 sec hold - Standing Hip Flexor Stretch  - 1-2 x daily - 7 x weekly - 3 reps - 30 sec hold - Hip Flexor Stretch with Strap on Table  - 1-2 x daily - 7 x weekly - 3 reps - 30 sec hold - Hooklying Hamstring Stretch with Strap  - 1-2 x daily - 7 x weekly - 3 reps - 30 sec hold - Supine Iliotibial Band Stretch with Strap  - 1-2 x daily - 7 x weekly - 3 reps - 30 sec hold - Supine Figure 4 Piriformis Stretch  - 1-2 x daily - 7 x weekly - 3 reps - 30 sec hold - Standing 3-way Hip with Walker  - 1 x daily - 3 x weekly - 2 sets - 10 reps - 3 sec hold - Hip Hiking on Step  - 1 x daily - 3 x weekly - 2 sets - 10 reps - 3 sec hold - Clam with Resistance  - 1 x daily - 3 x weekly - 2 sets - 10 reps - 3-5 sec hold - Seated March with Resistance  - 1 x daily - 3 x weekly - 2 sets - 10 reps - 3 sec hold   HEP from most recent PT episode: Access Code: [email or web address] URL: [email or web address] Date: 11/28/2022 Prepared by: Glenetta Hew   Exercises - Seated Heel Toe Raises  - 1 x daily - 7 x weekly - 2 sets - 10 reps - 3 sec hold - Standing Isometric Hip Abduction with Ball on Wall  - 1 x daily - 7 x weekly - 2 sets - 10 reps - 3 sec hold - Seated Transversus Abdominis Bracing  - 2 x daily - 7 x weekly - 2 sets - 10 reps - 3-5 sec hold - Seated Isometric Hip Abduction with Resistance  - 1 x daily - 3 x weekly - 2 sets - 10 reps - 3 sec hold - Seated March with Resistance  - 1 x daily - 3 x weekly - 2 sets - 10 reps - 3 sec hold - Seated Shoulder Row with Anchored Resistance  - 1 x daily - 3 x  weekly - 2 sets - 10 reps - 3-5 hold hold - Seated Single Arm Shoulder Row with Anchored Resistance  - 1 x daily - 3 x weekly - 2 sets - 10 reps - 3 sec hold - Seated Anti-Rotation Press With Anchored Resistance  - 1 x daily - 3 x weekly - 2 sets - 10 reps - 3 sec hold - Tall Kneeling Hip Hinge  - 1 x daily - 3-4 x weekly - 2 sets - 10 reps - 3 sec  hold   Quadruped PWR! Up & Step   ASSESSMENT:  CLINICAL IMPRESSION: Leanda requested clarification of positioning of strap as well as LE during supine ITB stretch, but otherwise denied any questions or concerns related to initial HEP.  Initiated core and proximal LE strengthening while incorporating left stability and hip hiking to Trendelenburg during gait.  Minor clarifications necessary to avoid substitution patterns but otherwise well-tolerated.  HEP updated to reflect exercise progression.  Timi will benefit from continued skilled PT to address ongoing deficits to improve mobility and activity tolerance with improved stability and balance to reduce fall risk.   OBJECTIVE IMPAIRMENTS: Abnormal gait, decreased activity tolerance, decreased balance, decreased coordination, decreased endurance, decreased mobility, difficulty walking, decreased ROM, decreased strength, decreased safety awareness, increased fascial restrictions, impaired perceived functional ability, increased muscle spasms, impaired flexibility, impaired sensation, impaired tone, improper body mechanics, and postural dysfunction.   ACTIVITY LIMITATIONS: carrying, lifting, bending, sitting, standing, squatting, stairs, transfers, bed mobility, bathing, toileting, dressing, reach over head, hygiene/grooming, locomotion level, and caring for others  PARTICIPATION LIMITATIONS: meal prep, cleaning, laundry, driving, shopping, community activity, yard work, and church  PERSONAL FACTORS: Age, Past/current experiences, Time since onset of injury/illness/exacerbation, and 3+ comorbidities:  L2-3 XLIF/PSF 05/24/22; C5-7 ACDF 09/21/20; idiopathic progressive neuropathy; hypthyroidism; DDD; remote h/o back surgery at age 72; h/o scoliosis; Takotsubo cardiomyopathy 01/10/22  are also affecting patient's functional outcome.   REHAB POTENTIAL: Good  CLINICAL DECISION MAKING: Evolving/moderate complexity  EVALUATION COMPLEXITY: Moderate   GOALS: Goals reviewed with patient? Yes  SHORT TERM GOALS: Target date: 08/26/2023  Patient will be independent with initial HEP to improve outcomes and carryover.  Baseline:  Goal status: IN PROGRESS  2.  Complete balance assessment and update LTG's as indicated.  Baseline:  Goal status: MET - 08/12/23   LONG TERM GOALS: Target date: 09/23/2023  Patient will be independent with ongoing/advanced HEP for self-management at home.  Baseline:  Goal status: IN PROGRESS  2.  Patient to demonstrate ability to achieve and maintain good spinal alignment/posturing and body mechanics needed for daily activities. Baseline:  Goal status: IN PROGRESS  3.  Patient will demonstrate functional pain free lumbar ROM to perform ADLs.   Baseline: Refer to above lumbar ROM table Goal status: IN PROGRESS  4.  Patient will demonstrate improved B proximal LE strength to >/= 4 to 4+/5 for improved stability and ease of mobility . Baseline: Refer to above LE MMT table Goal status: IN PROGRESS  5.  Patient will demonstrate improved B ankle strength to >/= 3+ to 4-/5 for improved gait stability with decreased foot slap.  Baseline: Refer to above LE MMT table Goal status: IN PROGRESS   6.  Patient will report >/= 60% on ABC scale to demonstrate improved balance confidence.  Baseline: 710 / 1600 = 44.4 % Goal status: IN PROGRESS   7. Patient will report </= 36% on Modified Oswestry to demonstrate improved functional ability with decreased pain interference. Baseline: 24 / 50 = 48.0 % Goal status: IN PROGRESS  8.  Patient will improve gait velocity to at least  2.62 ft/sec for improved gait efficiency and safety with community ambulation. Baseline: 2.31 ft/sec with single walking/trekking pole on R Goal status: IN PROGRESS  9.  Patient will improve Berg score to >/= 39/56 (8 points) to improve safety and stability with ADLs in standing and reduce risk for falls.  Baseline: 31/56 Goal status: IN PROGRESS  10.  Patient will improve FGA score to >/= 15/30 (4  points) to improve gait stability and reduce risk for falls.  Baseline: 11/30 Goal status: IN PROGRESS    PLAN:  PT FREQUENCY: 2x/week  PT DURATION: 8 weeks  PLANNED INTERVENTIONS: 97164- PT Re-evaluation, 97110-Therapeutic exercises, 97530- Therapeutic activity, 97112- Neuromuscular re-education, 97535- Self Care, 72536- Manual therapy, [id number]- Gait training, 97014- Electrical stimulation (unattended), [phone]- Electrical stimulation (manual), [phone]- Ionotophoresis 4mg /ml Dexamethasone, Patient/Family education, Balance training, Stair training, Taping, Dry Needling, Joint mobilization, DME instructions, Cryotherapy, and Moist heat  PLAN FOR NEXT SESSION:  Review initial HEP PRN; progress lumbopelvic/LE strengthening and core stabilization, updating HEP accordingly; balance training and gait stability    Marry Guan, PT 08/19/2023, 11:58 AM

## 2023-08-21 ENCOUNTER — Ambulatory Visit: Payer: Medicare PPO | Admitting: Physical Therapy

## 2023-08-21 DIAGNOSIS — M6281 Muscle weakness (generalized): Secondary | ICD-10-CM

## 2023-08-21 DIAGNOSIS — M21371 Foot drop, right foot: Secondary | ICD-10-CM

## 2023-08-21 DIAGNOSIS — M21372 Foot drop, left foot: Secondary | ICD-10-CM

## 2023-08-21 DIAGNOSIS — M5416 Radiculopathy, lumbar region: Secondary | ICD-10-CM

## 2023-08-21 DIAGNOSIS — R2689 Other abnormalities of gait and mobility: Secondary | ICD-10-CM

## 2023-08-21 DIAGNOSIS — R2681 Unsteadiness on feet: Secondary | ICD-10-CM

## 2023-08-21 NOTE — Therapy (Signed)
OUTPATIENT PHYSICAL THERAPY TREATMENT   Patient Name: [name] Trujillo MRN: [id number] DOB:08/31/1941, 82 y.o., female Today's Date: 08/21/2023  END OF SESSION:  PT End of Session - 08/21/23 1314     Visit Number 5    Date for PT Re-Evaluation 09/23/23    Authorization Type [employer]    Authorization Time Period 07/29/23 - 09/10/23    Authorization - Visit Number 4    Authorization - Number of Visits 8    Progress Note Due on Visit 10    PT Start Time 1314    PT Stop Time 1401    PT Time Calculation (min) 47 min    Activity Tolerance Patient tolerated treatment well    Behavior During Therapy [facility] for tasks assessed/performed                Past Medical History:  Diagnosis Date   High cholesterol    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   She had ACUTE TAKOTSUBO (STRESS) CARDIOMYOPATHY with elevated Troponin Levels - this would be considered "Demand Ischemia - Demand Infarction" & NOT associated with ACS/CAD.     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  - Cath with NO CAD. ==> RESOLVED: f/u Echo 03/2022: EF 60-65%.   Past Surgical History:  Procedure Laterality Date   APPENDECTOMY     28-18 yo   BACK SURGERY     Age 89   CESAREAN SECTION     ECTOPIC PREGNANCY SURGERY     LAPAROSCOPIC HYSTERECTOMY     LEFT HEART CATH AND CORONARY ANGIOGRAPHY N/A 01/09/2022   Procedure: LEFT HEART CATH AND CORONARY ANGIOGRAPHY;  Surgeon: Tonny Bollman, MD;  Location: [facility] INVASIVE CV LAB;  Service: CV:: Widely patent coronaries with mild nonobstructive LAD plaquing.  Right dominant system.  Normal LVEDP.  Based on clinical presentation, findings are consistent with acute Takotsubo  Cardiomyopathy Syndrome   POSTERIOR FUSION LUMBAR SPINE  05/24/2022   [facility], [place]; [name], MD): L2-L3 XLIF, L2-L3 POSTERIOR DECOMPRESSION AND FUSION   TRANSTHORACIC ECHOCARDIOGRAM  01/08/2022   Severely decreased LV function-EF 25 to 30%.  Mid to apical (mostly anterior) with normal basal motion.  GR 2 DD-elevated LAP.  Mildly dilated LA.  Aortic sclerosis with no stenosis.  No AI.  Normal MV with mild to moderate TR.  Mildly elevated RAP, and PAP (estimated 49 mg). If LAD CAD ruled out - consistent with Takotsubo CM Syndrome.   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    PCP: Mosetta Putt, MD   REFERRING PROVIDER: Rondel Oh, NP   REFERRING DIAG: Z98.1 (ICD-10-CM) - S/p lumbar spinal fusion  Eval and treat for: s/p lumbar fusion, lower extremity strengthening  THERAPY DIAG:  Muscle weakness (generalized)  Other abnormalities of gait and mobility  Unsteadiness on feet  Foot drop, left  Foot drop, right  Radiculopathy, lumbar region  RATIONALE FOR EVALUATION AND TREATMENT: Rehabilitation  ONSET DATE: 05/24/2022 - L2-L3 XLIF/PSF   NEXT MD VISIT: 12/31/2023 with Marlaine Hind, MD (Surgeon)   SUBJECTIVE:                                                                                                                                                                                                         SUBJECTIVE STATEMENT: Pt had a few  questions/requests for clarification with the initial HEP, primarily with ITB stretch.  PAIN: Are you having pain? No  PERTINENT HISTORY:  L2-3 XLIF/PSF 05/24/22; C5-7 ACDF 09/21/20; idiopathic progressive neuropathy; hypthyroidism; DDD; remote h/o back surgery at age 44; h/o scoliosis; Takotsubo cardiomyopathy 01/10/22   PRECAUTIONS: Fall  RED FLAGS: Bowel or bladder incontinence: Yes: chronic bladder issues, better bowel control since surgery  WEIGHT BEARING RESTRICTIONS: No  FALLS:  Has patient fallen in last 6 months? No  LIVING ENVIRONMENT: Lives with: lives with their spouse Lives in: House/apartment Stairs: Yes: External: 5-6 steps; on left going up Has following equipment at home: Environmental consultant - 2 wheeled, shower chair, and hiking/walking poles  OCCUPATION: Retired Runner, broadcasting/film/video   PLOF: Independent and Leisure: walking ~30 minutes 1x/day (3x around the court near where she lives); sewing/quilting; likes working in the yard but has not been able to do so due to her balance      PATIENT GOALS: "Improved balance (to feel more comfortable in a crowd and walk more normally)."   OBJECTIVE: (objective measures completed at initial evaluation unless otherwise dated)  DIAGNOSTIC FINDINGS:  06/30/23 - XR Lumbar spine: Stable postoperative changes of the lumbar spine with evidence of spinal fusion at L2-L3.   07/01/22 - Lumbar spine x-ray: Expected postoperative appearance related to L2-L3 fixation.    06/12/21 - Lumbar MRI: Postoperative and degenerative changes of the lumbar spine as described, similar compared to July 28, 2020. At L2-L3, there is moderate spinal canal stenosis due to disc bulging, endplate osteophytic spurring, facet and ligamentum flavum hypertrophy. Milder spinal canal stenosis is seen at L3-L4. There is multilevel neural foraminal stenosis.   PATIENT SURVEYS:  ABC scale 710 / 1600 = 44.4 % Modified Oswestry 24 / 50 = 48.0 %   SCREENING FOR RED FLAGS: Bowel or  bladder incontinence: Yes: chronic bladder issues, better bowel control since surgery Spinal tumors: No Cauda equina syndrome: No Compression fracture: No Abdominal aneurysm:  No  COGNITION:  Overall cognitive status: Within functional limits for tasks assessed    SENSATION: Light touch: Impaired - top of R foot Proprioception: Impaired    MUSCLE LENGTH: Hamstrings: mild/mod tight L>R ITB: mild tight B Piriformis: mod tight L>R Hip IR: mod tight B Hip flexors: WFL Quads: mild tight R>L Heelcord: mild/mod tight B  POSTURE:  rounded shoulders, forward head, right pelvic obliquity, and scoliosis  LUMBAR ROM: *ROM assessment limited in standing due to balance impairments  Active  Eval *  Flexion Hands to mid shin  Extension 50% limited  Right lateral flexion Hand to mid thigh  Left lateral flexion Hand to mid thigh  Right rotation 50% limited  Left rotation 50% limited  (Blank rows = not tested)  LOWER EXTREMITY ROM:    Grossly WFL other than limitations indicated above due to muscle tightness  LOWER EXTREMITY MMT:    MMT Right eval Left eval  Hip flexion 3 3  Hip extension 4 4  Hip abduction 4- 4-  Hip adduction 3+ 3+  Hip internal rotation 4- 3+  Hip external rotation 3 3  Knee flexion 5 5  Knee extension 4+ 4+  Ankle dorsiflexion 3 3-  Ankle plantarflexion 2 2  Ankle inversion 3 3-  Ankle eversion 4- 3+   (Blank rows = not tested)  FUNCTIONAL TESTS:  5 times sit to stand: 11.50 sec Timed up and go (TUG): 14.00 sec with single hiking pole; 12.25 sec w/o AD - 08/12/23 10 meter walk test: 14.22 sec with single walking/trekking pole; Gait speed = 2.31 ft/sec Berg Balance Scale: 31/56; < 36 high risk for falls (close to 100%) - 08/12/23 Functional gait assessment: 11/30; < 19 = high risk fall - 08/12/23   Standardized testing results as of discharge from latest PT episode (12/31/22): 5xSTS: 10.72 sec Berg: 48/56; 46-51 moderate risk for falls (>50%)  :  16.15 sec w/o AD Gait speed: 2.03 ft/sec FGA: 19/30; 19-24 = medium risk fall   GAIT: Distance walked: Clinic distances Assistive device utilized:  Single walking/trekking pole on R Level of assistance: SBA Gait pattern:  Increased sway, step through pattern, decreased stride length, trendelenburg, lateral hip instability, poor foot clearance- Right, and poor foot clearance- Left Comments: L>R foot slap   TODAY'S TREATMENT:   08/21/23 THERAPEUTIC EXERCISE: to improve flexibility, strength and mobility.  Verbal and tactile cues throughout for technique.  Rec bike - L2 x 6 min (seat #2)  NEUROMUSCULAR RE-EDUCATION: To improve posture, balance, proprioception, coordination, and reduce fall risk. Corner balance progression with narrow BOS and 1/2 tandem stance on firm surface with intermittent UE support on back of chair and arms at sides            Eyes open: - static stance x 30 sec - 1/2 tandem - horiz head turns x 5 - narrow BOS - vertical head nods x 5 - narrow BOS SLS + 4-way star taps to colored dots x 10 bil Alt toe clears to 9" stool x 10 SLS + opp KE cone knock down and righting with side step btw cones 2 x 6 cones bil B side-stepping with looped RTB at distal thighs with hiking pole & single HHA of PT 2 x 10 ft   08/19/23 THERAPEUTIC EXERCISE: to improve flexibility, strength and mobility.  Verbal and tactile cues throughout for technique.  Rec bike - L2 x 6 min (seat #2) SLS on 2" step + opp LE 3-way SLR x 10 bil  SLS on 2" step + opp LE hip hike x 10 bil Seated RTB hip ABD/ER clam x 10 bil S/L RTB clam x 10 bil Seated RTB march x 10 bil   08/14/23 THERAPEUTIC EXERCISE: to improve flexibility, strength and mobility.  Verbal and tactile cues throughout for technique.  Rec bike - L2 x 6 min (seat #2) Standing runner's stretch gastroc stretch at wall 2 x 30" bil Standing gastroc stretch at wall with forefoot on 2" block 2 x 30" bil Standing lunge position hip flexor  stretch at wall 2 x 30" bil Mod thomas hip flexor stretch with strap 2 x 30" bil Hooklying HS stretch with strap 2 x 30" bil Supine cross-body ITB/glute stretch with strap 2 x 30" bil Hooklying KTOS piriformis stretch x 30" - pt not feeling much of a stretch Hooklying figure-4 piriformis stretch with overpressure 2 x 30" bil - better stretch   PATIENT EDUCATION:  Education details: HEP review and HEP update - corner balance activity progression Person educated: Patient Education method: Explanation, Demonstration, Tactile cues, Verbal cues, and Handouts Education comprehension: verbalized understanding, returned demonstration, verbal cues required, tactile cues required, and needs further education  HOME EXERCISE PROGRAM: Access Code: [email or web address] URL: [email or web address] Date: 08/19/2023 Prepared by: Glenetta Hew  Exercises - Standing Gastroc Stretch at Counter  - 1-2 x daily - 7 x weekly - 3 reps - 30 sec hold - Standing Gastroc Stretch on Foam 1/2 Roll  - 1-2 x daily - 7 x weekly - 3 reps - 30 sec hold - Standing Hip Flexor Stretch  - 1-2 x daily - 7 x weekly - 3 reps - 30 sec hold - Hip Flexor Stretch with Strap on Table  - 1-2 x daily - 7 x weekly - 3 reps - 30 sec hold - Hooklying Hamstring Stretch with Strap  - 1-2 x daily - 7 x weekly - 3 reps - 30 sec hold - Supine Iliotibial Band Stretch with Strap  - 1-2 x daily - 7 x weekly - 3 reps - 30 sec hold - Supine Figure 4 Piriformis Stretch  - 1-2 x daily - 7 x weekly - 3 reps - 30 sec hold - Standing 3-way Hip with Walker  - 1 x daily - 3 x weekly - 2 sets - 10 reps - 3 sec hold - Hip Hiking on Step  - 1 x daily - 3 x weekly - 2 sets - 10 reps - 3 sec hold - Clam with Resistance  - 1 x daily - 3 x weekly - 2 sets - 10 reps - 3-5 sec hold - Seated March with Resistance  - 1 x daily - 3 x weekly - 2 sets - 10 reps - 3 sec hold   HEP from most recent PT episode: Access Code: [email or web address] URL:  [email or web address] Date: 11/28/2022 Prepared by: Glenetta Hew   Exercises - Seated Heel Toe Raises  - 1 x daily - 7 x weekly - 2 sets - 10 reps - 3 sec hold - Standing Isometric Hip Abduction with Ball on Wall  - 1 x daily - 7 x weekly - 2 sets - 10 reps - 3 sec hold - Seated Transversus Abdominis Bracing  - 2 x daily - 7 x weekly - 2 sets - 10 reps - 3-5 sec hold - Seated Isometric Hip Abduction with Resistance  - 1 x daily - 3 x weekly - 2 sets - 10 reps - 3 sec hold - Seated  March with Resistance  - 1 x daily - 3 x weekly - 2 sets - 10 reps - 3 sec hold - Seated Shoulder Row with Anchored Resistance  - 1 x daily - 3 x weekly - 2 sets - 10 reps - 3-5 hold hold - Seated Single Arm Shoulder Row with Anchored Resistance  - 1 x daily - 3 x weekly - 2 sets - 10 reps - 3 sec hold - Seated Anti-Rotation Press With Anchored Resistance  - 1 x daily - 3 x weekly - 2 sets - 10 reps - 3 sec hold - Tall Kneeling Hip Hinge  - 1 x daily - 3-4 x weekly - 2 sets - 10 reps - 3 sec hold   Quadruped PWR! Up & Step   ASSESSMENT:  CLINICAL IMPRESSION: Casidy requested clarification of hip hiking and three-way hip exercise added to HEP last visit with PT confirming desired pattern of alternating flexion/abduction/extension in sequence rather than performing sets of each individually to facilitate increased glute medius activation to reduce Trendelenburg during gait.  She denied need for further review of any other exercises therefore proceeded with initiation of balance training.  Reviewed corner balance exercise progression, emphasizing how to progress each component to further challenge balance including foot position, arm positioning, stationary versus head movements, and varying support surfaces.  Patient recalling some of this from previous therapy episodes but clarification of current levels and safe self progression for home.  Remainder of session focusing on dynamic stepping and SLS  activities to work on hip control well as balance/stability.  Shellye will benefit from continued skilled PT to address ongoing deficits to improve mobility and activity tolerance with improved stability and balance to reduce fall risk.   OBJECTIVE IMPAIRMENTS: Abnormal gait, decreased activity tolerance, decreased balance, decreased coordination, decreased endurance, decreased mobility, difficulty walking, decreased ROM, decreased strength, decreased safety awareness, increased fascial restrictions, impaired perceived functional ability, increased muscle spasms, impaired flexibility, impaired sensation, impaired tone, improper body mechanics, and postural dysfunction.   ACTIVITY LIMITATIONS: carrying, lifting, bending, sitting, standing, squatting, stairs, transfers, bed mobility, bathing, toileting, dressing, reach over head, hygiene/grooming, locomotion level, and caring for others  PARTICIPATION LIMITATIONS: meal prep, cleaning, laundry, driving, shopping, community activity, yard work, and church  PERSONAL FACTORS: Age, Past/current experiences, Time since onset of injury/illness/exacerbation, and 3+ comorbidities: L2-3 XLIF/PSF 05/24/22; C5-7 ACDF 09/21/20; idiopathic progressive neuropathy; hypthyroidism; DDD; remote h/o back surgery at age 64; h/o scoliosis; Takotsubo cardiomyopathy 01/10/22  are also affecting patient's functional outcome.   REHAB POTENTIAL: Good  CLINICAL DECISION MAKING: Evolving/moderate complexity  EVALUATION COMPLEXITY: Moderate   GOALS: Goals reviewed with patient? Yes  SHORT TERM GOALS: Target date: 08/26/2023  Patient will be independent with initial HEP to improve outcomes and carryover.  Baseline:  Goal status: MET - 08/21/23  2.  Complete balance assessment and update LTG's as indicated.  Baseline:  Goal status: MET - 08/12/23   LONG TERM GOALS: Target date: 09/23/2023  Patient will be independent with ongoing/advanced HEP for self-management at home.   Baseline:  Goal status: IN PROGRESS  2.  Patient to demonstrate ability to achieve and maintain good spinal alignment/posturing and body mechanics needed for daily activities. Baseline:  Goal status: IN PROGRESS  3.  Patient will demonstrate functional pain free lumbar ROM to perform ADLs.   Baseline: Refer to above lumbar ROM table Goal status: IN PROGRESS  4.  Patient will demonstrate improved B proximal LE strength to >/= 4 to 4+/5 for  improved stability and ease of mobility . Baseline: Refer to above LE MMT table Goal status: IN PROGRESS  5.  Patient will demonstrate improved B ankle strength to >/= 3+ to 4-/5 for improved gait stability with decreased foot slap.  Baseline: Refer to above LE MMT table Goal status: IN PROGRESS   6.  Patient will report >/= 60% on ABC scale to demonstrate improved balance confidence.  Baseline: 710 / 1600 = 44.4 % Goal status: IN PROGRESS   7. Patient will report </= 36% on Modified Oswestry to demonstrate improved functional ability with decreased pain interference. Baseline: 24 / 50 = 48.0 % Goal status: IN PROGRESS  8.  Patient will improve gait velocity to at least 2.62 ft/sec for improved gait efficiency and safety with community ambulation. Baseline: 2.31 ft/sec with single walking/trekking pole on R Goal status: IN PROGRESS  9.  Patient will improve Berg score to >/= 39/56 (8 points) to improve safety and stability with ADLs in standing and reduce risk for falls.  Baseline: 31/56 Goal status: IN PROGRESS  10.  Patient will improve FGA score to >/= 15/30 (4 points) to improve gait stability and reduce risk for falls.  Baseline: 11/30 Goal status: IN PROGRESS    PLAN:  PT FREQUENCY: 2x/week  PT DURATION: 8 weeks  PLANNED INTERVENTIONS: 97164- PT Re-evaluation, 97110-Therapeutic exercises, 97530- Therapeutic activity, 97112- Neuromuscular re-education, 97535- Self Care, 16109- Manual therapy, [id number]- Gait training, 97014-  Electrical stimulation (unattended), [phone]- Electrical stimulation (manual), [phone]- Ionotophoresis 4mg /ml Dexamethasone, Patient/Family education, Balance training, Stair training, Taping, Dry Needling, Joint mobilization, DME instructions, Cryotherapy, and Moist heat  PLAN FOR NEXT SESSION:  progress lumbopelvic/LE strengthening and core stabilization; review & HEP PRN; balance training and gait stability    Marry Guan, PT 08/21/2023, 4:09 PM

## 2023-08-26 ENCOUNTER — Ambulatory Visit: Payer: Medicare PPO | Admitting: Physical Therapy

## 2023-08-26 ENCOUNTER — Encounter: Payer: Self-pay | Admitting: Physical Therapy

## 2023-08-26 DIAGNOSIS — M5416 Radiculopathy, lumbar region: Secondary | ICD-10-CM

## 2023-08-26 DIAGNOSIS — M6281 Muscle weakness (generalized): Secondary | ICD-10-CM

## 2023-08-26 DIAGNOSIS — R2689 Other abnormalities of gait and mobility: Secondary | ICD-10-CM

## 2023-08-26 DIAGNOSIS — M21371 Foot drop, right foot: Secondary | ICD-10-CM

## 2023-08-26 DIAGNOSIS — M21372 Foot drop, left foot: Secondary | ICD-10-CM

## 2023-08-26 DIAGNOSIS — R2681 Unsteadiness on feet: Secondary | ICD-10-CM

## 2023-08-26 NOTE — Therapy (Signed)
OUTPATIENT PHYSICAL THERAPY TREATMENT   Patient Name: [name] Trujillo MRN: [id number] DOB:February 18, 1941, 82 y.o., female Today's Date: 08/26/2023  END OF SESSION:  PT End of Session - 08/26/23 1314     Visit Number 6    Date for PT Re-Evaluation 09/23/23    Authorization Type [employer]    Authorization Time Period 07/29/23 - 09/10/23    Authorization - Visit Number 5    Authorization - Number of Visits 8    Progress Note Due on Visit 10    PT Start Time 1314    PT Stop Time 1358    PT Time Calculation (min) 44 min    Activity Tolerance Patient tolerated treatment well    Behavior During Therapy [facility] for tasks assessed/performed                 Past Medical History:  Diagnosis Date   High cholesterol    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   She had ACUTE TAKOTSUBO (STRESS) CARDIOMYOPATHY with elevated Troponin Levels - this would be considered "Demand Ischemia - Demand Infarction" & NOT associated with ACS/CAD.     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  - Cath with NO CAD. ==> RESOLVED: f/u Echo 03/2022: EF 60-65%.   Past Surgical History:  Procedure Laterality Date   APPENDECTOMY     36-18 yo   BACK SURGERY     Age 73   CESAREAN SECTION     ECTOPIC PREGNANCY SURGERY     LAPAROSCOPIC HYSTERECTOMY     LEFT HEART CATH AND CORONARY ANGIOGRAPHY N/A 01/09/2022   Procedure: LEFT HEART CATH AND CORONARY ANGIOGRAPHY;  Surgeon: Tonny Bollman, MD;  Location: [facility] INVASIVE CV LAB;  Service: CV:: Widely patent coronaries with mild nonobstructive LAD plaquing.  Right dominant system.  Normal LVEDP.  Based on clinical presentation, findings are consistent with acute Takotsubo  Cardiomyopathy Syndrome   POSTERIOR FUSION LUMBAR SPINE  05/24/2022   [facility], [place]; [name], MD): L2-L3 XLIF, L2-L3 POSTERIOR DECOMPRESSION AND FUSION   TRANSTHORACIC ECHOCARDIOGRAM  01/08/2022   Severely decreased LV function-EF 25 to 30%.  Mid to apical (mostly anterior) with normal basal motion.  GR 2 DD-elevated LAP.  Mildly dilated LA.  Aortic sclerosis with no stenosis.  No AI.  Normal MV with mild to moderate TR.  Mildly elevated RAP, and PAP (estimated 49 mg). If LAD CAD ruled out - consistent with Takotsubo CM Syndrome.   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    PCP: Mosetta Putt, MD   REFERRING PROVIDER: Rondel Oh, NP   REFERRING DIAG: Z98.1 (ICD-10-CM) - S/p lumbar spinal fusion  Eval and treat for: s/p lumbar fusion, lower extremity  strengthening  THERAPY DIAG:  Muscle weakness (generalized)  Other abnormalities of gait and mobility  Unsteadiness on feet  Foot drop, left  Foot drop, right  Radiculopathy, lumbar region  RATIONALE FOR EVALUATION AND TREATMENT: Rehabilitation  ONSET DATE: 05/24/2022 - L2-L3 XLIF/PSF   NEXT MD VISIT: 12/31/2023 with Marlaine Hind, MD (Surgeon)   SUBJECTIVE:                                                                                                                                                                                                         SUBJECTIVE STATEMENT: Pt had a few  questions/requests for clarification with the initial HEP, primarily with ITB stretch.  PAIN: Are you having pain? No  PERTINENT HISTORY:  L2-3 XLIF/PSF 05/24/22; C5-7 ACDF 09/21/20; idiopathic progressive neuropathy; hypthyroidism; DDD; remote h/o back surgery at age 39; h/o scoliosis; Takotsubo cardiomyopathy 01/10/22   PRECAUTIONS: Fall  RED FLAGS: Bowel or bladder incontinence: Yes: chronic bladder issues, better bowel control since surgery  WEIGHT BEARING RESTRICTIONS: No  FALLS:  Has patient fallen in last 6 months? No  LIVING ENVIRONMENT: Lives with: lives with their spouse Lives in: House/apartment Stairs: Yes: External: 5-6 steps; on left going up Has following equipment at home: Environmental consultant - 2 wheeled, shower chair, and hiking/walking poles  OCCUPATION: Retired Runner, broadcasting/film/video   PLOF: Independent and Leisure: walking ~30 minutes 1x/day (3x around the court near where she lives); sewing/quilting; likes working in the yard but has not been able to do so due to her balance      PATIENT GOALS: "Improved balance (to feel more comfortable in a crowd and walk more normally)."   OBJECTIVE: (objective measures completed at initial evaluation unless otherwise dated)  DIAGNOSTIC FINDINGS:  06/30/23 - XR Lumbar spine: Stable postoperative changes of the lumbar spine with evidence of spinal fusion at L2-L3.   07/01/22 - Lumbar spine x-ray: Expected postoperative appearance related to L2-L3 fixation.    06/12/21 - Lumbar MRI: Postoperative and degenerative changes of the lumbar spine as described, similar compared to July 28, 2020. At L2-L3, there is moderate spinal canal stenosis due to disc bulging, endplate osteophytic spurring, facet and ligamentum flavum hypertrophy. Milder spinal canal stenosis is seen at L3-L4. There is multilevel neural foraminal stenosis.   PATIENT SURVEYS:  ABC scale 710 / 1600 = 44.4 % Modified Oswestry 24 / 50 = 48.0 %   SCREENING FOR RED FLAGS: Bowel or  bladder incontinence: Yes: chronic bladder issues, better bowel control since surgery Spinal tumors: No Cauda equina syndrome: No Compression fracture: No  Abdominal aneurysm: No  COGNITION:  Overall cognitive status: Within functional limits for tasks assessed    SENSATION: Light touch: Impaired - top of R foot Proprioception: Impaired    MUSCLE LENGTH: Hamstrings: mild/mod tight L>R ITB: mild tight B Piriformis: mod tight L>R Hip IR: mod tight B Hip flexors: WFL Quads: mild tight R>L Heelcord: mild/mod tight B  POSTURE:  rounded shoulders, forward head, right pelvic obliquity, and scoliosis  LUMBAR ROM: *ROM assessment limited in standing due to balance impairments  Active  Eval *  Flexion Hands to mid shin  Extension 50% limited  Right lateral flexion Hand to mid thigh  Left lateral flexion Hand to mid thigh  Right rotation 50% limited  Left rotation 50% limited  (Blank rows = not tested)  LOWER EXTREMITY ROM:    Grossly WFL other than limitations indicated above due to muscle tightness  LOWER EXTREMITY MMT:    MMT Right eval Left eval  Hip flexion 3 3  Hip extension 4 4  Hip abduction 4- 4-  Hip adduction 3+ 3+  Hip internal rotation 4- 3+  Hip external rotation 3 3  Knee flexion 5 5  Knee extension 4+ 4+  Ankle dorsiflexion 3 3-  Ankle plantarflexion 2 2  Ankle inversion 3 3-  Ankle eversion 4- 3+   (Blank rows = not tested)  FUNCTIONAL TESTS:  5 times sit to stand: 11.50 sec Timed up and go (TUG): 14.00 sec with single hiking pole; 12.25 sec w/o AD - 08/12/23 10 meter walk test: 14.22 sec with single walking/trekking pole; Gait speed = 2.31 ft/sec Berg Balance Scale: 31/56; < 36 high risk for falls (close to 100%) - 08/12/23 Functional gait assessment: 11/30; < 19 = high risk fall - 08/12/23   Standardized testing results as of discharge from latest PT episode (12/31/22): 5xSTS: 10.72 sec Berg: 48/56; 46-51 moderate risk for falls (>50%)  :  16.15 sec w/o AD Gait speed: 2.03 ft/sec FGA: 19/30; 19-24 = medium risk fall   GAIT: Distance walked: Clinic distances Assistive device utilized:  Single walking/trekking pole on R Level of assistance: SBA Gait pattern:  Increased sway, step through pattern, decreased stride length, trendelenburg, lateral hip instability, poor foot clearance- Right, and poor foot clearance- Left Comments: L>R foot slap   TODAY'S TREATMENT:   08/26/23 THERAPEUTIC EXERCISE: to improve flexibility, strength and mobility.  Verbal and tactile cues throughout for technique.  Rec bike - L3-2 x 6 min (seat #2)  NEUROMUSCULAR RE-EDUCATION: To improve posture, balance, proprioception, coordination, reduce fall risk, and amplitude of movement.  SLS + 4-way star taps to colored dots x 10 bil Standing PWR! Moves (set up with back of chair in front of patient while patient standing in front of mat table for safety, SBA/CGA of PT for all patterns): Up 2 x 10 - patient with repeated posterior LOB on initial attempts requiring unplanned sit down on mat table, but eventually better able to regulate eccentric to concentric transition Rock - lateral weight shift x 10 each to L & R separately (cues to keep knees straight and use toes to push off on trailing leg while elongating body in direction of reach), x 10 B alternating sides - trailing hand remaining on back of chair for balance for most repetitions Twist x 10 - modified with 1 hand remaining on back of chair while patient rotating torso and reaching 180 behind Step x 10 each to L & R separately, x 10 B alternating sides -  trailing hand remaining on back of chair for balance   08/21/23 THERAPEUTIC EXERCISE: to improve flexibility, strength and mobility.  Verbal and tactile cues throughout for technique.  Rec bike - L2 x 6 min (seat #2)  NEUROMUSCULAR RE-EDUCATION: To improve posture, balance, proprioception, coordination, and reduce fall risk. Corner balance  progression with narrow BOS and 1/2 tandem stance on firm surface with intermittent UE support on back of chair and arms at sides            Eyes open: - static stance x 30 sec - 1/2 tandem - horiz head turns x 5 - narrow BOS - vertical head nods x 5 - narrow BOS SLS + 4-way star taps to colored dots x 10 bil Alt toe clears to 9" stool x 10 SLS + opp KE cone knock down and righting with side step btw cones 2 x 6 cones bil B side-stepping with looped RTB at distal thighs with hiking pole & single HHA of PT 2 x 10 ft   08/19/23 THERAPEUTIC EXERCISE: to improve flexibility, strength and mobility.  Verbal and tactile cues throughout for technique.  Rec bike - L2 x 6 min (seat #2) SLS on 2" step + opp LE 3-way SLR x 10 bil SLS on 2" step + opp LE hip hike x 10 bil Seated RTB hip ABD/ER clam x 10 bil S/L RTB clam x 10 bil Seated RTB march x 10 bil   PATIENT EDUCATION:  Education details: HEP review and HEP update - standing PWR! Moves Person educated: Patient Education method: Explanation, Demonstration, Tactile cues, Verbal cues, and Handouts Education comprehension: verbalized understanding, returned demonstration, verbal cues required, tactile cues required, and needs further education  HOME EXERCISE PROGRAM: Access Code: [email or web address] URL: [email or web address] Date: 08/26/2023 Prepared by: Glenetta Hew  Exercises - Standing Gastroc Stretch at Counter  - 1-2 x daily - 7 x weekly - 3 reps - 30 sec hold - Standing Gastroc Stretch on Foam 1/2 Roll  - 1-2 x daily - 7 x weekly - 3 reps - 30 sec hold - Standing Hip Flexor Stretch  - 1-2 x daily - 7 x weekly - 3 reps - 30 sec hold - Hip Flexor Stretch with Strap on Table  - 1-2 x daily - 7 x weekly - 3 reps - 30 sec hold - Hooklying Hamstring Stretch with Strap  - 1-2 x daily - 7 x weekly - 3 reps - 30 sec hold - Supine Iliotibial Band Stretch with Strap  - 1-2 x daily - 7 x weekly - 3 reps - 30 sec hold - Seated Table  Piriformis Stretch  - 1-2 x daily - 7 x weekly - 3 reps - 30 sec hold - Standing 3-way Hip with Walker  - 1 x daily - 3 x weekly - 2 sets - 10 reps - 3 sec hold - Hip Hiking on Step  - 1 x daily - 3 x weekly - 2 sets - 10 reps - 3 sec hold - Clam with Resistance  - 1 x daily - 3 x weekly - 2 sets - 10 reps - 3-5 sec hold - Seated March with Resistance  - 1 x daily - 3 x weekly - 2 sets - 10 reps - 3 sec hold - Corner Balance Feet Together: Eyes Open With Head Turns  - 1 x daily - 7 x weekly - 2 sets - 5 reps  Standing PWR! Moves   HEP from most recent PT  episode: Access Code: [email or web address] URL: [email or web address] Date: 11/28/2022 Prepared by: Glenetta Hew   Exercises - Seated Heel Toe Raises  - 1 x daily - 7 x weekly - 2 sets - 10 reps - 3 sec hold - Standing Isometric Hip Abduction with Ball on Wall  - 1 x daily - 7 x weekly - 2 sets - 10 reps - 3 sec hold - Seated Transversus Abdominis Bracing  - 2 x daily - 7 x weekly - 2 sets - 10 reps - 3-5 sec hold - Seated Isometric Hip Abduction with Resistance  - 1 x daily - 3 x weekly - 2 sets - 10 reps - 3 sec hold - Seated March with Resistance  - 1 x daily - 3 x weekly - 2 sets - 10 reps - 3 sec hold - Seated Shoulder Row with Anchored Resistance  - 1 x daily - 3 x weekly - 2 sets - 10 reps - 3-5 hold hold - Seated Single Arm Shoulder Row with Anchored Resistance  - 1 x daily - 3 x weekly - 2 sets - 10 reps - 3 sec hold - Seated Anti-Rotation Press With Anchored Resistance  - 1 x daily - 3 x weekly - 2 sets - 10 reps - 3 sec hold - Tall Kneeling Hip Hinge  - 1 x daily - 3-4 x weekly - 2 sets - 10 reps - 3 sec hold   Quadruped PWR! Up & Step   ASSESSMENT:  CLINICAL IMPRESSION: Lindyn reports HEP going well with no further need for review or clarification at this point, and expressing desire to continue to focus on balance during therapy sessions.  Incorporated PWR! Moves to work on Copy during weight shifting,  transitional movements and turning activities as she continues to experience the most balance difficulty with maintaining control without stepping to correct balance during weight shifting and directional changes.  Patient requiring several repetitions to learn to regulate control and velocity of movement with several minor LOB on initial repetitions (including several unplanned sit downs during PWR! Up squatting portion), however patient able to self-correct for many LOB with only CGA of PT.   Cystal will benefit from continued skilled PT to address ongoing deficits to improve mobility and activity tolerance with improved stability and balance to reduce fall risk.   OBJECTIVE IMPAIRMENTS: Abnormal gait, decreased activity tolerance, decreased balance, decreased coordination, decreased endurance, decreased mobility, difficulty walking, decreased ROM, decreased strength, decreased safety awareness, increased fascial restrictions, impaired perceived functional ability, increased muscle spasms, impaired flexibility, impaired sensation, impaired tone, improper body mechanics, and postural dysfunction.   ACTIVITY LIMITATIONS: carrying, lifting, bending, sitting, standing, squatting, stairs, transfers, bed mobility, bathing, toileting, dressing, reach over head, hygiene/grooming, locomotion level, and caring for others  PARTICIPATION LIMITATIONS: meal prep, cleaning, laundry, driving, shopping, community activity, yard work, and church  PERSONAL FACTORS: Age, Past/current experiences, Time since onset of injury/illness/exacerbation, and 3+ comorbidities: L2-3 XLIF/PSF 05/24/22; C5-7 ACDF 09/21/20; idiopathic progressive neuropathy; hypthyroidism; DDD; remote h/o back surgery at age 11; h/o scoliosis; Takotsubo cardiomyopathy 01/10/22  are also affecting patient's functional outcome.   REHAB POTENTIAL: Good  CLINICAL DECISION MAKING: Evolving/moderate complexity  EVALUATION COMPLEXITY: Moderate   GOALS: Goals  reviewed with patient? Yes  SHORT TERM GOALS: Target date: 08/26/2023  Patient will be independent with initial HEP to improve outcomes and carryover.  Baseline:  Goal status: MET - 08/21/23  2.  Complete balance assessment and update LTG's as indicated.  Baseline:  Goal  status: MET - 08/12/23   LONG TERM GOALS: Target date: 09/23/2023  Patient will be independent with ongoing/advanced HEP for self-management at home.  Baseline:  Goal status: IN PROGRESS  2.  Patient to demonstrate ability to achieve and maintain good spinal alignment/posturing and body mechanics needed for daily activities. Baseline:  Goal status: IN PROGRESS  3.  Patient will demonstrate functional pain free lumbar ROM to perform ADLs.   Baseline: Refer to above lumbar ROM table Goal status: IN PROGRESS  4.  Patient will demonstrate improved B proximal LE strength to >/= 4 to 4+/5 for improved stability and ease of mobility . Baseline: Refer to above LE MMT table Goal status: IN PROGRESS  5.  Patient will demonstrate improved B ankle strength to >/= 3+ to 4-/5 for improved gait stability with decreased foot slap.  Baseline: Refer to above LE MMT table Goal status: IN PROGRESS   6.  Patient will report >/= 60% on ABC scale to demonstrate improved balance confidence.  Baseline: 710 / 1600 = 44.4 % Goal status: IN PROGRESS   7. Patient will report </= 36% on Modified Oswestry to demonstrate improved functional ability with decreased pain interference. Baseline: 24 / 50 = 48.0 % Goal status: IN PROGRESS  8.  Patient will improve gait velocity to at least 2.62 ft/sec for improved gait efficiency and safety with community ambulation. Baseline: 2.31 ft/sec with single walking/trekking pole on R Goal status: IN PROGRESS  9.  Patient will improve Berg score to >/= 39/56 (8 points) to improve safety and stability with ADLs in standing and reduce risk for falls.  Baseline: 31/56 Goal status: IN PROGRESS  10.   Patient will improve FGA score to >/= 15/30 (4 points) to improve gait stability and reduce risk for falls.  Baseline: 11/30 Goal status: IN PROGRESS    PLAN:  PT FREQUENCY: 2x/week  PT DURATION: 8 weeks  PLANNED INTERVENTIONS: 97164- PT Re-evaluation, 97110-Therapeutic exercises, 97530- Therapeutic activity, 97112- Neuromuscular re-education, 97535- Self Care, 16109- Manual therapy, [id number]- Gait training, 97014- Electrical stimulation (unattended), [phone]- Electrical stimulation (manual), [phone]- Ionotophoresis 4mg /ml Dexamethasone, Patient/Family education, Balance training, Stair training, Taping, Dry Needling, Joint mobilization, DME instructions, Cryotherapy, and Moist heat  PLAN FOR NEXT SESSION:  progress lumbopelvic/LE strengthening and core stabilization; review & HEP PRN; balance training and gait stability    Marry Guan, PT 08/26/2023, 3:15 PM

## 2023-08-28 ENCOUNTER — Encounter: Payer: Self-pay | Admitting: Physical Therapy

## 2023-08-28 ENCOUNTER — Ambulatory Visit: Payer: Medicare PPO | Admitting: Physical Therapy

## 2023-08-28 DIAGNOSIS — M6281 Muscle weakness (generalized): Secondary | ICD-10-CM

## 2023-08-28 DIAGNOSIS — R2689 Other abnormalities of gait and mobility: Secondary | ICD-10-CM

## 2023-08-28 DIAGNOSIS — M21372 Foot drop, left foot: Secondary | ICD-10-CM

## 2023-08-28 DIAGNOSIS — R2681 Unsteadiness on feet: Secondary | ICD-10-CM

## 2023-08-28 DIAGNOSIS — M21371 Foot drop, right foot: Secondary | ICD-10-CM

## 2023-08-28 DIAGNOSIS — M5416 Radiculopathy, lumbar region: Secondary | ICD-10-CM

## 2023-08-28 NOTE — Therapy (Signed)
OUTPATIENT PHYSICAL THERAPY TREATMENT   Patient Name: [name] Trujillo MRN: [id number] DOB:07/09/1941, 82 y.o., female Today's Date: 08/28/2023  END OF SESSION:  PT End of Session - 08/28/23 1311     Visit Number 7    Date for PT Re-Evaluation 09/23/23    Authorization Type [employer]    Authorization Time Period 07/29/23 - 09/10/23    Authorization - Visit Number 6    Authorization - Number of Visits 8    Progress Note Due on Visit 10    PT Start Time 1311    PT Stop Time 1356    PT Time Calculation (min) 45 min    Activity Tolerance Patient tolerated treatment well    Behavior During Therapy [facility] for tasks assessed/performed                  Past Medical History:  Diagnosis Date   High cholesterol    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   She had ACUTE TAKOTSUBO (STRESS) CARDIOMYOPATHY with elevated Troponin Levels - this would be considered "Demand Ischemia - Demand Infarction" & NOT associated with ACS/CAD.     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  - Cath with NO CAD. ==> RESOLVED: f/u Echo 03/2022: EF 60-65%.   Past Surgical History:  Procedure Laterality Date   APPEN[age]CTOMY     27-18 yo   BACK SURGERY     Age 40   CESAREAN SECTION     ECTOPIC PREGNANCY SURGERY     LAPAROSCOPIC HYSTERECTOMY     LEFT HEART CATH AND CORONARY ANGIOGRAPHY N/A 01/09/2022   Procedure: LEFT HEART CATH AND CORONARY ANGIOGRAPHY;  Surgeon: Tonny Bollman, MD;  Location: [facility] INVASIVE CV LAB;  Service: CV:: Widely patent coronaries with mild nonobstructive LAD plaquing.  Right dominant system.  Normal LVEDP.  Based on clinical presentation, findings are consistent with acute  Takotsubo Cardiomyopathy Syndrome   POSTERIOR FUSION LUMBAR SPINE  05/24/2022   [facility] [place], [place]; [name], MD): L2-L3 XLIF, L2-L3 POSTERIOR DECOMPRESSION AND FUSION   TRANSTHORACIC ECHOCARDIOGRAM  01/08/2022   Severely decreased LV function-EF 25 to 30%.  Mid to apical (mostly anterior) with normal basal motion.  GR 2 DD-elevated LAP.  Mildly dilated LA.  Aortic sclerosis with no stenosis.  No AI.  Normal MV with mild to moderate TR.  Mildly elevated RAP, and PAP (estimated 49 mg). If LAD CAD ruled out - consistent with Takotsubo CM Syndrome.   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    PCP: Mosetta Putt, MD   REFERRING PROVIDER: Rondel Oh, NP   REFERRING DIAG: Z98.1 (ICD-10-CM) - S/p lumbar spinal fusion  Eval and treat for: s/p lumbar fusion, lower  extremity strengthening  THERAPY DIAG:  Muscle weakness (generalized)  Other abnormalities of gait and mobility  Unsteadiness on feet  Foot drop, left  Foot drop, right  Radiculopathy, lumbar region  RATIONALE FOR EVALUATION AND TREATMENT: Rehabilitation  ONSET DATE: 05/24/2022 - L2-L3 XLIF/PSF   NEXT MD VISIT: 12/31/2023 with Marlaine Hind, MD (Surgeon)   SUBJECTIVE:                                                                                                                                                                                                         SUBJECTIVE STATEMENT: Pt  had a few questions/requests for clarification with the initial HEP, primarily with ITB stretch.  PAIN: Are you having pain? No  PERTINENT HISTORY:  L2-3 XLIF/PSF 05/24/22; C5-7 ACDF 09/21/20; idiopathic progressive neuropathy; hypthyroidism; DDD; remote h/o back surgery at age 75; h/o scoliosis; Takotsubo cardiomyopathy 01/10/22   PRECAUTIONS: Fall  RED FLAGS: Bowel or bladder incontinence: Yes: chronic bladder issues, better bowel control since surgery  WEIGHT BEARING RESTRICTIONS: No  FALLS:  Has patient fallen in last 6 months? No  LIVING ENVIRONMENT: Lives with: lives with their spouse Lives in: House/apartment Stairs: Yes: External: 5-6 steps; on left going up Has following equipment at home: Environmental consultant - 2 wheeled, shower chair, and hiking/walking poles  OCCUPATION: Retired Runner, broadcasting/film/video   PLOF: Independent and Leisure: walking ~30 minutes 1x/day (3x around the court near where she lives); sewing/quilting; likes working in the yard but has not been able to do so due to her balance      PATIENT GOALS: "Improved balance (to feel more comfortable in a crowd and walk more normally)."   OBJECTIVE: (objective measures completed at initial evaluation unless otherwise dated)  DIAGNOSTIC FINDINGS:  06/30/23 - XR Lumbar spine: Stable postoperative changes of the lumbar spine with evidence of spinal fusion at L2-L3.   07/01/22 - Lumbar spine x-ray: Expected postoperative appearance related to L2-L3 fixation.    06/12/21 - Lumbar MRI: Postoperative and degenerative changes of the lumbar spine as described, similar compared to July 28, 2020. At L2-L3, there is moderate spinal canal stenosis due to disc bulging, endplate osteophytic spurring, facet and ligamentum flavum hypertrophy. Milder spinal canal stenosis is seen at L3-L4. There is multilevel neural foraminal stenosis.   PATIENT SURVEYS:  ABC scale 710 / 1600 = 44.4 % Modified Oswestry 24 / 50 = 48.0 %   SCREENING FOR RED  FLAGS: Bowel or bladder incontinence: Yes: chronic bladder issues, better bowel control since surgery Spinal tumors: No Cauda equina syndrome: No Compression fracture:  No Abdominal aneurysm: No  COGNITION:  Overall cognitive status: Within functional limits for tasks assessed    SENSATION: Light touch: Impaired - top of R foot Proprioception: Impaired    MUSCLE LENGTH: Hamstrings: mild/mod tight L>R ITB: mild tight B Piriformis: mod tight L>R Hip IR: mod tight B Hip flexors: WFL Quads: mild tight R>L Heelcord: mild/mod tight B  POSTURE:  rounded shoulders, forward head, right pelvic obliquity, and scoliosis  LUMBAR ROM: *ROM assessment limited in standing due to balance impairments  Active  Eval *  Flexion Hands to mid shin  Extension 50% limited  Right lateral flexion Hand to mid thigh  Left lateral flexion Hand to mid thigh  Right rotation 50% limited  Left rotation 50% limited  (Blank rows = not tested)  LOWER EXTREMITY ROM:    Grossly WFL other than limitations indicated above due to muscle tightness  LOWER EXTREMITY MMT:    MMT Right eval Left eval  Hip flexion 3 3  Hip extension 4 4  Hip abduction 4- 4-  Hip adduction 3+ 3+  Hip internal rotation 4- 3+  Hip external rotation 3 3  Knee flexion 5 5  Knee extension 4+ 4+  Ankle dorsiflexion 3 3-  Ankle plantarflexion 2 2  Ankle inversion 3 3-  Ankle eversion 4- 3+   (Blank rows = not tested)  FUNCTIONAL TESTS:  5 times sit to stand: 11.50 sec Timed up and go (TUG): 14.00 sec with single hiking pole; 12.25 sec w/o AD - 08/12/23 10 meter walk test: 14.22 sec with single walking/trekking pole; Gait speed = 2.31 ft/sec Berg Balance Scale: 31/56; < 36 high risk for falls (close to 100%) - 08/12/23 Functional gait assessment: 11/30; < 19 = high risk fall - 08/12/23   Standardized testing results as of discharge from latest PT episode (12/31/22): 5xSTS: 10.72 sec Berg: 48/56; 46-51 moderate risk for falls  (>50%)  : 16.15 sec w/o AD Gait speed: 2.03 ft/sec FGA: 19/30; 19-24 = medium risk fall   GAIT: Distance walked: Clinic distances Assistive device utilized:  Single walking/trekking pole on R Level of assistance: SBA Gait pattern:  Increased sway, step through pattern, decreased stride length, trendelenburg, lateral hip instability, poor foot clearance- Right, and poor foot clearance- Left Comments: L>R foot slap   TODAY'S TREATMENT:   08/28/23 THERAPEUTIC EXERCISE: to improve flexibility, strength and mobility.  Verbal and tactile cues throughout for technique.  Rec bike - L3-2 x 6 min (seat #2) Seated figure-4 piriformis stretch with foot resting on yoga block on 9" stool + slight fwd hip hinge (multiple variations attempted as an alternative to side-sitting hip hinge piriformis stretch prior to determining best stretch with this version)  NEUROMUSCULAR RE-EDUCATION: To improve posture, balance, proprioception, coordination, reduce fall risk, amplitude of movement, and reduce rigidity. Standing PWR! Moves (set up with back of chair in front of patient while patient standing in front of mat table for safety, SBA/CGA of PT for all patterns): Up 2 x 10 - coordinating UE reach to counterbalance squat progressing into shoulder extension for full upright posture upon return to stand - pt still experiencing some repeated posterior LOB on initial attempts requiring unplanned sit down on mat table, but eventually better able to regulate eccentric to concentric transition Rock x 10 alternating sides - trailing hand remaining on back of chair for balance for most repetitions Twist x 10 - modified with 1 hand remaining on back of chair while patient rotating torso and reaching 180  behind Step x 10 each to L & R separately, x 10 B alternating sides - trailing hand remaining on back of chair for balance PWR! Step back on L - pt lost control on descent lowering in to deep knee bend on L LE  requiring PT assist to recover to standing, therefore deferred further attempts and assisted pt back to sitting on treatment table PWR! Step forward between 2 chairs with one hand on back of each chair   08/26/23 THERAPEUTIC EXERCISE: to improve flexibility, strength and mobility.  Verbal and tactile cues throughout for technique.  Rec bike - L3-2 x 6 min (seat #2)  NEUROMUSCULAR RE-EDUCATION: To improve posture, balance, proprioception, coordination, reduce fall risk, and amplitude of movement.  SLS + 4-way star taps to colored dots x 10 bil Standing PWR! Moves (set up with back of chair in front of patient while patient standing in front of mat table for safety, SBA/CGA of PT for all patterns): Up 2 x 10 - patient with repeated posterior LOB on initial attempts requiring a few unplanned sit downs on mat table (although less than last visit), but eventually better able to regulate eccentric to concentric transition Rock - lateral weight shift x 10 each to L & R separately (cues to keep knees straight and use toes to push off on trailing leg while elongating body in direction of reach), x 10 B alternating sides - trailing hand remaining on back of chair for balance for most repetitions Twist x 10 - modified with 1 hand remaining on back of chair while patient rotating torso and reaching 180 behind Step x 10 each to L & R separately, x 10 B alternating sides - trailing hand remaining on back of chair for balance   08/21/23 THERAPEUTIC EXERCISE: to improve flexibility, strength and mobility.  Verbal and tactile cues throughout for technique.  Rec bike - L2 x 6 min (seat #2)  NEUROMUSCULAR RE-EDUCATION: To improve posture, balance, proprioception, coordination, and reduce fall risk. Corner balance progression with narrow BOS and 1/2 tandem stance on firm surface with intermittent UE support on back of chair and arms at sides            Eyes open: - static stance x 30 sec - 1/2 tandem - horiz  head turns x 5 - narrow BOS - vertical head nods x 5 - narrow BOS SLS + 4-way star taps to colored dots x 10 bil Alt toe clears to 9" stool x 10 SLS + opp KE cone knock down and righting with side step btw cones 2 x 6 cones bil B side-stepping with looped RTB at distal thighs with hiking pole & single HHA of PT 2 x 10 ft   PATIENT EDUCATION:  Education details: continue with current HEP Person educated: Patient Education method: Explanation Education comprehension: verbalized understanding and needs further education  HOME EXERCISE PROGRAM: Access Code: [email or web address] URL: [email or web address] Date: 08/26/2023 Prepared by: Glenetta Hew  Exercises - Standing Gastroc Stretch at Counter  - 1-2 x daily - 7 x weekly - 3 reps - 30 sec hold - Standing Gastroc Stretch on Foam 1/2 Roll  - 1-2 x daily - 7 x weekly - 3 reps - 30 sec hold - Standing Hip Flexor Stretch  - 1-2 x daily - 7 x weekly - 3 reps - 30 sec hold - Hip Flexor Stretch with Strap on Table  - 1-2 x daily - 7 x weekly - 3 reps - 30 sec hold -  Hooklying Hamstring Stretch with Strap  - 1-2 x daily - 7 x weekly - 3 reps - 30 sec hold - Supine Iliotibial Band Stretch with Strap  - 1-2 x daily - 7 x weekly - 3 reps - 30 sec hold - Seated Table Piriformis Stretch  - 1-2 x daily - 7 x weekly - 3 reps - 30 sec hold - Standing 3-way Hip with Walker  - 1 x daily - 3 x weekly - 2 sets - 10 reps - 3 sec hold - Hip Hiking on Step  - 1 x daily - 3 x weekly - 2 sets - 10 reps - 3 sec hold - Clam with Resistance  - 1 x daily - 3 x weekly - 2 sets - 10 reps - 3-5 sec hold - Seated March with Resistance  - 1 x daily - 3 x weekly - 2 sets - 10 reps - 3 sec hold - Corner Balance Feet Together: Eyes Open With Head Turns  - 1 x daily - 7 x weekly - 2 sets - 5 reps  Standing PWR! Moves   HEP from most recent PT episode: Access Code: [email or web address] URL: [email or web address] Date: 11/28/2022 Prepared by: Glenetta Hew    Exercises - Seated Heel Toe Raises  - 1 x daily - 7 x weekly - 2 sets - 10 reps - 3 sec hold - Standing Isometric Hip Abduction with Ball on Wall  - 1 x daily - 7 x weekly - 2 sets - 10 reps - 3 sec hold - Seated Transversus Abdominis Bracing  - 2 x daily - 7 x weekly - 2 sets - 10 reps - 3-5 sec hold - Seated Isometric Hip Abduction with Resistance  - 1 x daily - 3 x weekly - 2 sets - 10 reps - 3 sec hold - Seated March with Resistance  - 1 x daily - 3 x weekly - 2 sets - 10 reps - 3 sec hold - Seated Shoulder Row with Anchored Resistance  - 1 x daily - 3 x weekly - 2 sets - 10 reps - 3-5 hold hold - Seated Single Arm Shoulder Row with Anchored Resistance  - 1 x daily - 3 x weekly - 2 sets - 10 reps - 3 sec hold - Seated Anti-Rotation Press With Anchored Resistance  - 1 x daily - 3 x weekly - 2 sets - 10 reps - 3 sec hold - Tall Kneeling Hip Hinge  - 1 x daily - 3-4 x weekly - 2 sets - 10 reps - 3 sec hold   Quadruped PWR! Up & Step   ASSESSMENT:  CLINICAL IMPRESSION: Alantra feels that PWR! Moves introduced last visit are appropriately challenging for her balance issues but did request review and clarification of some portions of the moves today.  Reinforced safe set up with appropriate positioning of UE support as well as chair behind for safety for home performance.  Attempted to progress PWR! Step into forward and backward patterns, however even with UE support patient demonstrating poor control into retrostep squat requiring PT assistance to prevent fall and help patient return to standing.  Kamil will benefit from continued skilled PT to address ongoing deficits to improve mobility and activity tolerance with improved stability and balance to reduce fall risk.   OBJECTIVE IMPAIRMENTS: Abnormal gait, decreased activity tolerance, decreased balance, decreased coordination, decreased endurance, decreased mobility, difficulty walking, decreased ROM, decreased strength, decreased safety  awareness, increased fascial restrictions, impaired perceived functional  ability, increased muscle spasms, impaired flexibility, impaired sensation, impaired tone, improper body mechanics, and postural dysfunction.   ACTIVITY LIMITATIONS: carrying, lifting, bending, sitting, standing, squatting, stairs, transfers, bed mobility, bathing, toileting, dressing, reach over head, hygiene/grooming, locomotion level, and caring for others  PARTICIPATION LIMITATIONS: meal prep, cleaning, laundry, driving, shopping, community activity, yard work, and church  PERSONAL FACTORS: Age, Past/current experiences, Time since onset of injury/illness/exacerbation, and 3+ comorbidities: L2-3 XLIF/PSF 05/24/22; C5-7 ACDF 09/21/20; idiopathic progressive neuropathy; hypthyroidism; DDD; remote h/o back surgery at age 63; h/o scoliosis; Takotsubo cardiomyopathy 01/10/22  are also affecting patient's functional outcome.   REHAB POTENTIAL: Good  CLINICAL DECISION MAKING: Evolving/moderate complexity  EVALUATION COMPLEXITY: Moderate   GOALS: Goals reviewed with patient? Yes  SHORT TERM GOALS: Target date: 08/26/2023  Patient will be independent with initial HEP to improve outcomes and carryover.  Baseline:  Goal status: MET - 08/21/23  2.  Complete balance assessment and update LTG's as indicated.  Baseline:  Goal status: MET - 08/12/23   LONG TERM GOALS: Target date: 09/23/2023  Patient will be independent with ongoing/advanced HEP for self-management at home.  Baseline:  Goal status: IN PROGRESS - 08/28/23   2.  Patient to demonstrate ability to achieve and maintain good spinal alignment/posturing and body mechanics needed for daily activities. Baseline:  Goal status: IN PROGRESS  3.  Patient will demonstrate functional pain free lumbar ROM to perform ADLs.   Baseline: Refer to above lumbar ROM table Goal status: IN PROGRESS  4.  Patient will demonstrate improved B proximal LE strength to >/= 4 to 4+/5 for  improved stability and ease of mobility . Baseline: Refer to above LE MMT table Goal status: IN PROGRESS  5.  Patient will demonstrate improved B ankle strength to >/= 3+ to 4-/5 for improved gait stability with decreased foot slap.  Baseline: Refer to above LE MMT table Goal status: IN PROGRESS   6.  Patient will report >/= 60% on ABC scale to demonstrate improved balance confidence.  Baseline: 710 / 1600 = 44.4 % Goal status: IN PROGRESS   7. Patient will report </= 36% on Modified Oswestry to demonstrate improved functional ability with decreased pain interference. Baseline: 24 / 50 = 48.0 % Goal status: IN PROGRESS  8.  Patient will improve gait velocity to at least 2.62 ft/sec for improved gait efficiency and safety with community ambulation. Baseline: 2.31 ft/sec with single walking/trekking pole on R Goal status: IN PROGRESS  9.  Patient will improve Berg score to >/= 39/56 (8 points) to improve safety and stability with ADLs in standing and reduce risk for falls.  Baseline: 31/56 Goal status: IN PROGRESS  10.  Patient will improve FGA score to >/= 15/30 (4 points) to improve gait stability and reduce risk for falls.  Baseline: 11/30 Goal status: IN PROGRESS    PLAN:  PT FREQUENCY: 2x/week  PT DURATION: 8 weeks  PLANNED INTERVENTIONS: 97164- PT Re-evaluation, 97110-Therapeutic exercises, 97530- Therapeutic activity, 97112- Neuromuscular re-education, 97535- Self Care, 16109- Manual therapy, 97116- Gait training, 97014- Electrical stimulation (unattended), [phone]- Electrical stimulation (manual), [phone]- Ionotophoresis 4mg /ml Dexamethasone, Patient/Family education, Balance training, Stair training, Taping, Dry Needling, Joint mobilization, DME instructions, Cryotherapy, and Moist heat  PLAN FOR NEXT SESSION: Primary focus on balance training and gait stability; progress lumbopelvic/LE strengthening and core stabilization; review & HEP PRN   Marry Guan,  PT 08/28/2023, 4:47 PM

## 2023-09-11 ENCOUNTER — Ambulatory Visit: Payer: Medicare PPO | Attending: Registered" | Admitting: Physical Therapy

## 2023-09-11 DIAGNOSIS — M21371 Foot drop, right foot: Secondary | ICD-10-CM | POA: Diagnosis present

## 2023-09-11 DIAGNOSIS — R2681 Unsteadiness on feet: Secondary | ICD-10-CM

## 2023-09-11 DIAGNOSIS — M21372 Foot drop, left foot: Secondary | ICD-10-CM

## 2023-09-11 DIAGNOSIS — M5416 Radiculopathy, lumbar region: Secondary | ICD-10-CM

## 2023-09-11 DIAGNOSIS — R2689 Other abnormalities of gait and mobility: Secondary | ICD-10-CM | POA: Diagnosis present

## 2023-09-11 DIAGNOSIS — M6281 Muscle weakness (generalized): Secondary | ICD-10-CM | POA: Diagnosis present

## 2023-09-11 NOTE — Therapy (Addendum)
 OUTPATIENT PHYSICAL THERAPY TREATMENT   Patient Name: [name] Trujillo MRN: [id number] DOB:02/23/1941, 83 y.o., female Today's Date: 09/11/2023  END OF SESSION:  PT End of Session - 09/11/23 1315     Visit Number 8    Date for PT Re-Evaluation 09/23/23    Authorization Type [employer]    Authorization Time Period auth pending    Progress Note Due on Visit 10    PT Start Time 1315    PT Stop Time 1359    PT Time Calculation (min) 44 min    Activity Tolerance Patient tolerated treatment well    Behavior During Therapy WFL for tasks assessed/performed                   Past Medical History:  Diagnosis Date   High cholesterol    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   She had ACUTE TAKOTSUBO (STRESS) CARDIOMYOPATHY with elevated Troponin Levels - this would be considered Demand Ischemia - Demand Infarction & NOT associated with ACS/CAD.     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  - Cath with NO CAD. ==> RESOLVED: f/u Echo 03/2022: EF 60-65%.   Past Surgical History:  Procedure Laterality Date   APPEN[age]CTOMY     32-18 yo   BACK SURGERY     Age 89   CESAREAN SECTION     ECTOPIC PREGNANCY SURGERY     LAPAROSCOPIC HYSTERECTOMY     LEFT HEART CATH AND CORONARY ANGIOGRAPHY N/A 01/09/2022   Procedure: LEFT HEART CATH AND CORONARY ANGIOGRAPHY;  Surgeon: Wonda Sharper, MD;  Location: [facility] INVASIVE CV LAB;  Service: CV:: Widely patent coronaries with mild nonobstructive LAD plaquing.  Right dominant system.  Normal LVEDP.  Based on clinical presentation, findings are consistent with acute Takotsubo Cardiomyopathy Syndrome   POSTERIOR FUSION LUMBAR SPINE  05/24/2022    [facility], [place]; [name], MD): L2-L3 XLIF, L2-L3 POSTERIOR DECOMPRESSION AND FUSION   TRANSTHORACIC ECHOCARDIOGRAM  01/08/2022   Severely decreased LV function-EF 25 to 30%.  Mid to apical (mostly anterior) with normal basal motion.  GR 2 DD-elevated LAP.  Mildly dilated LA.  Aortic sclerosis with no stenosis.  No AI.  Normal MV with mild to moderate TR.  Mildly elevated RAP, and PAP (estimated 49 mg). If LAD CAD ruled out - consistent with Takotsubo CM Syndrome.   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    PCP: Windy Coy, MD   REFERRING PROVIDER: Tobie Raisin, NP   REFERRING DIAG: Z98.1 (ICD-10-CM) - S/p lumbar spinal fusion  Eval and treat for: s/p lumbar fusion, lower extremity strengthening  THERAPY DIAG:  Muscle weakness (generalized)  Other abnormalities of gait and mobility  Unsteadiness on feet  Foot drop, left  Foot drop, right  Radiculopathy, lumbar region  RATIONALE FOR EVALUATION AND TREATMENT: Rehabilitation  ONSET DATE: 05/24/2022 - L2-L3 XLIF/PSF   NEXT MD VISIT: 12/31/2023 with Norleen [name], MD (Surgeon)   SUBJECTIVE:                                                                                                                                                                                                         SUBJECTIVE STATEMENT: Pt reports difficulty moving for a few days after attempting to progress herself to  the RTB with her HEP.  PAIN: Are you having pain? No  PERTINENT HISTORY:  L2-3 XLIF/PSF 05/24/22; C5-7 ACDF 09/21/20; idiopathic progressive neuropathy; hypthyroidism; DDD; remote h/o back surgery at age 24; h/o scoliosis; Takotsubo cardiomyopathy 01/10/22   PRECAUTIONS: Fall  RED FLAGS: Bowel or bladder incontinence: Yes: chronic bladder issues, better bowel control since surgery  WEIGHT BEARING RESTRICTIONS: No  FALLS:  Has patient fallen in last 6 months? No  LIVING ENVIRONMENT: Lives with: lives with their spouse Lives in: House/apartment Stairs: Yes: External: 5-6 steps; on left going up Has following equipment at home: Walker - 2 wheeled, shower chair, and hiking/walking poles  OCCUPATION: Retired runner, broadcasting/film/video   PLOF: Independent and Leisure: walking ~30 minutes 1x/day (3x around the court near where she lives); sewing/quilting; likes working in the yard but has not been able to do so due to her balance      PATIENT GOALS: Improved balance (to feel more comfortable in a crowd and walk more normally).   OBJECTIVE: (objective measures completed at initial evaluation unless otherwise dated)  DIAGNOSTIC FINDINGS:  06/30/23 - XR Lumbar spine: Stable postoperative changes of the lumbar spine with evidence of spinal fusion at L2-L3.   07/01/22 - Lumbar spine x-ray: Expected postoperative appearance related to L2-L3 fixation.    06/12/21 - Lumbar MRI: Postoperative and degenerative changes of the lumbar spine as described, similar compared to July 28, 2020. At L2-L3, there is moderate spinal canal stenosis due to disc bulging, endplate osteophytic spurring, facet and ligamentum flavum hypertrophy. Milder spinal canal stenosis is seen at L3-L4. There is multilevel neural foraminal stenosis.   PATIENT SURVEYS:  ABC scale 710 / 1600 = 44.4 % Modified Oswestry 24 / 50 = 48.0 %   SCREENING FOR RED FLAGS: Bowel or bladder incontinence: Yes: chronic bladder issues, better bowel  control since surgery Spinal tumors: No Cauda equina syndrome: No Compression fracture: No Abdominal aneurysm: No  COGNITION:  Overall cognitive status: Within functional limits  for tasks assessed    SENSATION: Light touch: Impaired - top of R foot Proprioception: Impaired    MUSCLE LENGTH: Hamstrings: mild/mod tight L>R ITB: mild tight B Piriformis: mod tight L>R Hip IR: mod tight B Hip flexors: WFL Quads: mild tight R>L Heelcord: mild/mod tight B  POSTURE:  rounded shoulders, forward head, right pelvic obliquity, and scoliosis  LUMBAR ROM: *ROM assessment limited in standing due to balance impairments  Active  Eval *  Flexion Hands to mid shin  Extension 50% limited  Right lateral flexion Hand to mid thigh  Left lateral flexion Hand to mid thigh  Right rotation 50% limited  Left rotation 50% limited  (Blank rows = not tested)  LOWER EXTREMITY ROM:    Grossly WFL other than limitations indicated above due to muscle tightness  LOWER EXTREMITY MMT:    MMT Right eval Left eval  Hip flexion 3 3  Hip extension 4 4  Hip abduction 4- 4-  Hip adduction 3+ 3+  Hip internal rotation 4- 3+  Hip external rotation 3 3  Knee flexion 5 5  Knee extension 4+ 4+  Ankle dorsiflexion 3 3-  Ankle plantarflexion 2 2  Ankle inversion 3 3-  Ankle eversion 4- 3+   (Blank rows = not tested)  FUNCTIONAL TESTS:  5 times sit to stand: 11.50 sec Timed up and go (TUG): 14.00 sec with single hiking pole; 12.25 sec w/o AD - 08/12/23 10 meter walk test: 14.22 sec with single walking/trekking pole; Gait speed = 2.31 ft/sec Berg Balance Scale: 31/56; < 36 high risk for falls (close to 100%) - 08/12/23 Functional gait assessment: 11/30; < 19 = high risk fall - 08/12/23   Standardized testing results as of discharge from latest PT episode (12/31/22): 5xSTS: 10.72 sec Berg: 48/56; 46-51 moderate risk for falls (>50%)  : 16.15 sec w/o AD Gait speed: 2.03 ft/sec FGA: 19/30; 19-24 =  medium risk fall   GAIT: Distance walked: Clinic distances Assistive device utilized:  Single walking/trekking pole on R Level of assistance: SBA Gait pattern:  Increased sway, step through pattern, decreased stride length, trendelenburg, lateral hip instability, poor foot clearance- Right, and poor foot clearance- Left Comments: L>R foot slap   TODAY'S TREATMENT:   09/11/23 THERAPEUTIC EXERCISE: to improve flexibility, strength and mobility.  Verbal and tactile cues throughout for technique.  Rec bike - L3 x 4 min, L2 x 2 min (seat #2)  NEUROMUSCULAR RE-EDUCATION: To improve posture, balance, proprioception, coordination, reduce fall risk, amplitude of movement, and reduce rigidity.  PWR! Step forward between 2 chairs with one hand on back of each chair x 10 with each foot PWR! Step back between 2 chairs with one hand on back of each chair x 10 with each foot PWR! Step through forward and back between 2 chairs with one hand on back of each chair x 10 with each foot Unsupported side-stepping with back to wall 4 x 10 ft bilaterally - 1 rep trying to use PWR! Side-step pattern (arms spreading with lateral step returning to side as feet come back together)   08/28/23 THERAPEUTIC EXERCISE: to improve flexibility, strength and mobility.  Verbal and tactile cues throughout for technique.  Rec bike - L3-2 x 6 min (seat #2) Seated figure-4 piriformis stretch with foot resting on yoga block on 9 stool + slight fwd hip hinge (multiple variations attempted as an alternative to side-sitting hip hinge piriformis stretch prior to determining best stretch with this version)  NEUROMUSCULAR RE-EDUCATION: To  improve posture, balance, proprioception, coordination, reduce fall risk, amplitude of movement, and reduce rigidity. Standing PWR! Moves (set up with back of chair in front of patient while patient standing in front of mat table for safety, SBA/CGA of PT for all patterns): Up 2 x 10 - coordinating UE  reach to counterbalance squat progressing into shoulder extension for full upright posture upon return to stand - pt still experiencing some repeated posterior LOB on initial attempts requiring unplanned sit down on mat table, but eventually better able to regulate eccentric to concentric transition Rock x 10 alternating sides - trailing hand remaining on back of chair for balance for most repetitions Twist x 10 - modified with 1 hand remaining on back of chair while patient rotating torso and reaching 180 behind Step x 10 each to L & R separately, x 10 B alternating sides - trailing hand remaining on back of chair for balance PWR! Step back on L - pt lost control on descent lowering in to deep knee bend on L LE requiring PT assist to recover to standing, therefore deferred further attempts and assisted pt back to sitting on treatment table PWR! Step forward between 2 chairs with one hand on back of each chair   08/26/23 THERAPEUTIC EXERCISE: to improve flexibility, strength and mobility.  Verbal and tactile cues throughout for technique.  Rec bike - L3-2 x 6 min (seat #2)  NEUROMUSCULAR RE-EDUCATION: To improve posture, balance, proprioception, coordination, reduce fall risk, and amplitude of movement.  SLS + 4-way star taps to colored dots x 10 bil Standing PWR! Moves (set up with back of chair in front of patient while patient standing in front of mat table for safety, SBA/CGA of PT for all patterns): Up 2 x 10 - patient with repeated posterior LOB on initial attempts requiring a few unplanned sit downs on mat table (although less than last visit), but eventually better able to regulate eccentric to concentric transition Rock - lateral weight shift x 10 each to L & R separately (cues to keep knees straight and use toes to push off on trailing leg while elongating body in direction of reach), x 10 B alternating sides - trailing hand remaining on back of chair for balance for most  repetitions Twist x 10 - modified with 1 hand remaining on back of chair while patient rotating torso and reaching 180 behind Step x 10 each to L & R separately, x 10 B alternating sides - trailing hand remaining on back of chair for balance   PATIENT EDUCATION:  Education details: continue with current HEP Person educated: Patient Education method: Explanation Education comprehension: verbalized understanding and needs further education  HOME EXERCISE PROGRAM: Access Code: [email or web address] URL: [email or web address] Date: 08/26/2023 Prepared by: Elijah Hidden  Exercises - Standing Gastroc Stretch at Counter  - 1-2 x daily - 7 x weekly - 3 reps - 30 sec hold - Standing Gastroc Stretch on Foam 1/2 Roll  - 1-2 x daily - 7 x weekly - 3 reps - 30 sec hold - Standing Hip Flexor Stretch  - 1-2 x daily - 7 x weekly - 3 reps - 30 sec hold - Hip Flexor Stretch with Strap on Table  - 1-2 x daily - 7 x weekly - 3 reps - 30 sec hold - Hooklying Hamstring Stretch with Strap  - 1-2 x daily - 7 x weekly - 3 reps - 30 sec hold - Supine Iliotibial Band Stretch with Strap  - 1-2 x daily -  7 x weekly - 3 reps - 30 sec hold - Seated Table Piriformis Stretch  - 1-2 x daily - 7 x weekly - 3 reps - 30 sec hold - Standing 3-way Hip with Walker  - 1 x daily - 3 x weekly - 2 sets - 10 reps - 3 sec hold - Hip Hiking on Step  - 1 x daily - 3 x weekly - 2 sets - 10 reps - 3 sec hold - Clam with Resistance  - 1 x daily - 3 x weekly - 2 sets - 10 reps - 3-5 sec hold - Seated March with Resistance  - 1 x daily - 3 x weekly - 2 sets - 10 reps - 3 sec hold - Corner Balance Feet Together: Eyes Open With Head Turns  - 1 x daily - 7 x weekly - 2 sets - 5 reps  Standing PWR! Moves   HEP from most recent PT episode: Access Code: [email or web address] URL: [email or web address] Date: 11/28/2022 Prepared by: Elijah Hidden   Exercises - Seated Heel Toe Raises  - 1 x daily - 7 x weekly - 2 sets - 10 reps - 3 sec  hold - Standing Isometric Hip Abduction with Ball on Wall  - 1 x daily - 7 x weekly - 2 sets - 10 reps - 3 sec hold - Seated Transversus Abdominis Bracing  - 2 x daily - 7 x weekly - 2 sets - 10 reps - 3-5 sec hold - Seated Isometric Hip Abduction with Resistance  - 1 x daily - 3 x weekly - 2 sets - 10 reps - 3 sec hold - Seated March with Resistance  - 1 x daily - 3 x weekly - 2 sets - 10 reps - 3 sec hold - Seated Shoulder Row with Anchored Resistance  - 1 x daily - 3 x weekly - 2 sets - 10 reps - 3-5 hold hold - Seated Single Arm Shoulder Row with Anchored Resistance  - 1 x daily - 3 x weekly - 2 sets - 10 reps - 3 sec hold - Seated Anti-Rotation Press With Anchored Resistance  - 1 x daily - 3 x weekly - 2 sets - 10 reps - 3 sec hold - Tall Kneeling Hip Hinge  - 1 x daily - 3-4 x weekly - 2 sets - 10 reps - 3 sec hold   Quadruped PWR! Up & Step   ASSESSMENT:  CLINICAL IMPRESSION: Carlette reports increased muscle soreness following self progression of resistance with HEP exercises causing her difficulty with moving around for several days, but now resolved.  She continues to demonstrate significant proximal/hip and distal/ankle LE weakness as evidenced by her continued Trendelenburg gait and foot drop.  She reports she continues to feel most limited with balance, often relying on multiple steps to right herself after LOB, therefore continued to work on multidirectional stepping strategies while incorporating postural control and awareness.  No loss of control with retrostep squat today however patient having difficulty coordinating movement patterns at times and with notable fatigue requiring intermittent seated rest breaks.  During sidestepping activities without support, she struggles to advance the lead leg due to lateral hip instability and tends to slide rather than lift trailing leg, with several instances where she needs to steady herself against the wall or use multiple steps to correct LOB.   Leasia is demonstrating progress with PT but continues to have significant weakness and balance deficits and will benefit from continued skilled PT  to address ongoing deficits to improve mobility and activity tolerance with improved stability and balance to reduce fall risk.  Will plan to initiate standardized balance testing and goal reassessment over next few visits as I anticipate she will benefit from recertification at the end of our current episode.  OBJECTIVE IMPAIRMENTS: Abnormal gait, decreased activity tolerance, decreased balance, decreased coordination, decreased endurance, decreased mobility, difficulty walking, decreased ROM, decreased strength, decreased safety awareness, increased fascial restrictions, impaired perceived functional ability, increased muscle spasms, impaired flexibility, impaired sensation, impaired tone, improper body mechanics, and postural dysfunction.   ACTIVITY LIMITATIONS: carrying, lifting, bending, sitting, standing, squatting, stairs, transfers, bed mobility, bathing, toileting, dressing, reach over head, hygiene/grooming, locomotion level, and caring for others  PARTICIPATION LIMITATIONS: meal prep, cleaning, laundry, driving, shopping, community activity, yard work, and church  PERSONAL FACTORS: Age, Past/current experiences, Time since onset of injury/illness/exacerbation, and 3+ comorbidities: L2-3 XLIF/PSF 05/24/22; C5-7 ACDF 09/21/20; idiopathic progressive neuropathy; hypthyroidism; DDD; remote h/o back surgery at age 30; h/o scoliosis; Takotsubo cardiomyopathy 01/10/22  are also affecting patient's functional outcome.   REHAB POTENTIAL: Good  CLINICAL DECISION MAKING: Evolving/moderate complexity  EVALUATION COMPLEXITY: Moderate   GOALS: Goals reviewed with patient? Yes  SHORT TERM GOALS: Target date: 08/26/2023  Patient will be independent with initial HEP to improve outcomes and carryover.  Baseline:  Goal status: MET - 08/21/23  2.  Complete  balance assessment and update LTG's as indicated.  Baseline:  Goal status: MET - 08/12/23   LONG TERM GOALS: Target date: 09/23/2023  Patient will be independent with ongoing/advanced HEP for self-management at home.  Baseline:  Goal status: IN PROGRESS - 09/11/23   2.  Patient to demonstrate ability to achieve and maintain good spinal alignment/posturing and body mechanics needed for daily activities. Baseline:  Goal status: IN PROGRESS - 09/11/23 - Pt aware of good posture and body mechanics but limited at times by impaired balance  3.  Patient will demonstrate functional pain free lumbar ROM to perform ADLs.   Baseline: Refer to above lumbar ROM table Goal status: IN PROGRESS  4.  Patient will demonstrate improved B proximal LE strength to >/= 4 to 4+/5 for improved stability and ease of mobility . Baseline: Refer to above LE MMT table Goal status: IN PROGRESS  5.  Patient will demonstrate improved B ankle strength to >/= 3+ to 4-/5 for improved gait stability with decreased foot slap.  Baseline: Refer to above LE MMT table Goal status: IN PROGRESS   6.  Patient will report >/= 60% on ABC scale to demonstrate improved balance confidence.  Baseline: 710 / 1600 = 44.4 % Goal status: IN PROGRESS   7. Patient will report </= 36% on Modified Oswestry to demonstrate improved functional ability with decreased pain interference. Baseline: 24 / 50 = 48.0 % Goal status: IN PROGRESS  8.  Patient will improve gait velocity to at least 2.62 ft/sec for improved gait efficiency and safety with community ambulation. Baseline: 2.31 ft/sec with single walking/trekking pole on R Goal status: IN PROGRESS  9.  Patient will improve Berg score to >/= 39/56 (8 points) to improve safety and stability with ADLs in standing and reduce risk for falls.  Baseline: 31/56 Goal status: IN PROGRESS  10.  Patient will improve FGA score to >/= 15/30 (4 points) to improve gait stability and reduce risk for falls.   Baseline: 11/30 Goal status: IN PROGRESS    PLAN:  PT FREQUENCY: 2x/week  PT DURATION: 8 weeks  PLANNED INTERVENTIONS:  02835- PT Re-evaluation, 97110-Therapeutic exercises, 97530- Therapeutic activity, [id number]- Neuromuscular re-education, 97535- Self Care, [id number]- Manual therapy, [phone]- Gait training, 97014- Electrical stimulation (unattended), [phone]- Electrical stimulation (manual), [phone]- Ionotophoresis 4mg /ml Dexamethasone, Patient/Family education, Balance training, Stair training, Taping, Dry Needling, Joint mobilization, DME instructions, Cryotherapy, and Moist heat  PLAN FOR NEXT SESSION: Initiate reassessment of standardized balance testing and LTG goal assessment; primary focus on balance training and gait stability; progress lumbopelvic/LE strengthening and core stabilization; review & HEP PRN   Elijah CHRISTELLA Hidden, PT 09/11/2023, 6:04 PM

## 2023-09-16 ENCOUNTER — Encounter: Payer: Self-pay | Admitting: Physical Therapy

## 2023-09-16 ENCOUNTER — Ambulatory Visit: Payer: Medicare PPO | Admitting: Physical Therapy

## 2023-09-16 DIAGNOSIS — M21372 Foot drop, left foot: Secondary | ICD-10-CM

## 2023-09-16 DIAGNOSIS — R2689 Other abnormalities of gait and mobility: Secondary | ICD-10-CM

## 2023-09-16 DIAGNOSIS — M6281 Muscle weakness (generalized): Secondary | ICD-10-CM

## 2023-09-16 DIAGNOSIS — M21371 Foot drop, right foot: Secondary | ICD-10-CM

## 2023-09-16 DIAGNOSIS — R2681 Unsteadiness on feet: Secondary | ICD-10-CM

## 2023-09-16 DIAGNOSIS — M5416 Radiculopathy, lumbar region: Secondary | ICD-10-CM

## 2023-09-16 NOTE — Therapy (Signed)
 OUTPATIENT PHYSICAL THERAPY TREATMENT   Patient Name: [name] Trujillo MRN: [id number] DOB:October 16, 1940, 83 y.o., female Today's Date: 09/16/2023  END OF SESSION:  PT End of Session - 09/16/23 1313     Visit Number 9    Date for PT Re-Evaluation 09/23/23    Authorization Type [employer]    Authorization Time Period 09/11/23 - 10/23/23    Authorization - Visit Number 2    Authorization - Number of Visits 8    Progress Note Due on Visit 10    PT Start Time 1313    PT Stop Time 1358    PT Time Calculation (min) 45 min    Activity Tolerance Patient tolerated treatment well    Behavior During Therapy [name] for tasks assessed/performed                   Past Medical History:  Diagnosis Date   High cholesterol    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   She had ACUTE TAKOTSUBO (STRESS) CARDIOMYOPATHY with elevated Troponin Levels - this would be considered Demand Ischemia - Demand Infarction & NOT associated with ACS/CAD.     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  - Cath with NO CAD. ==> RESOLVED: f/u Echo 03/2022: EF 60-65%.   Past Surgical History:  Procedure Laterality Date   APPENDECTOMY     60-81 yo   BACK SURGERY     Age 21   CESAREAN SECTION     ECTOPIC PREGNANCY SURGERY     LAPAROSCOPIC HYSTERECTOMY     LEFT HEART CATH AND CORONARY ANGIOGRAPHY N/A 01/09/2022   Procedure: LEFT HEART CATH AND CORONARY ANGIOGRAPHY;  Surgeon: [name] Sharper, MD;  Location: [facility] INVASIVE CV LAB;  Service: CV:: Widely patent coronaries with mild nonobstructive LAD plaquing.  Right dominant system.  Normal LVEDP.  Based on clinical presentation, findings are consistent with acute  Takotsubo Cardiomyopathy Syndrome   POSTERIOR FUSION LUMBAR SPINE  05/24/2022   [facility], [place]; [name] Lemmings, MD): L2-L3 XLIF, L2-L3 POSTERIOR DECOMPRESSION AND FUSION   TRANSTHORACIC ECHOCARDIOGRAM  01/08/2022   Severely decreased LV function-EF 25 to 30%.  Mid to apical (mostly anterior) with normal basal motion.  GR 2 DD-elevated LAP.  Mildly dilated LA.  Aortic sclerosis with no stenosis.  No AI.  Normal MV with mild to moderate TR.  Mildly elevated RAP, and PAP (estimated 49 mg). If LAD CAD ruled out - consistent with Takotsubo CM Syndrome.   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    PCP: [name] Coy, MD   REFERRING PROVIDER: Tobie Raisin, NP   REFERRING DIAG: Z98.1 (ICD-10-CM) - S/p lumbar spinal fusion  Eval and treat for: s/p lumbar fusion,  lower extremity strengthening  THERAPY DIAG:  Muscle weakness (generalized)  Other abnormalities of gait and mobility  Unsteadiness on feet  Foot drop, left  Foot drop, right  Radiculopathy, lumbar region  RATIONALE FOR EVALUATION AND TREATMENT: Rehabilitation  ONSET DATE: 05/24/2022 - L2-L3 XLIF/PSF   NEXT MD VISIT: 12/31/2023 with [name] [name] Lemmings, MD (Surgeon)   SUBJECTIVE:                                                                                                                                                                                                         SUBJECTIVE STATEMENT: Pt  finds herself still having to lean forward against things (vanity, counter) to keep her balance.  PAIN: Are you having pain? No  PERTINENT HISTORY:  L2-3 XLIF/PSF 05/24/22; C5-7 ACDF 09/21/20; idiopathic progressive neuropathy; hypthyroidism; DDD; remote h/o back surgery at age 61; h/o scoliosis; Takotsubo cardiomyopathy 01/10/22   PRECAUTIONS: Fall  RED FLAGS: Bowel or bladder incontinence: Yes: chronic bladder issues, better bowel control since surgery  WEIGHT BEARING RESTRICTIONS: No  FALLS:  Has patient fallen in last 6 months? No  LIVING ENVIRONMENT: Lives with: lives with their spouse Lives in: House/apartment Stairs: Yes: External: 5-6 steps; on left going up Has following equipment at home: Walker - 2 wheeled, shower chair, and hiking/walking poles  OCCUPATION: Retired runner, broadcasting/film/video   PLOF: Independent and Leisure: walking ~30 minutes 1x/day (3x around the court near where she lives); sewing/quilting; likes working in the yard but has not been able to do so due to her balance      PATIENT GOALS: Improved balance (to feel more comfortable in a crowd and walk more normally).   OBJECTIVE: (objective measures completed at initial evaluation unless otherwise dated)  DIAGNOSTIC FINDINGS:  06/30/23 - XR Lumbar spine: Stable postoperative changes of the lumbar spine with evidence of spinal fusion at L2-L3.   07/01/22 - Lumbar spine x-ray: Expected postoperative appearance related to L2-L3 fixation.    06/12/21 - Lumbar MRI: Postoperative and degenerative changes of the lumbar spine as described, similar compared to July 28, 2020. At L2-L3, there is moderate spinal canal stenosis due to disc bulging, endplate osteophytic spurring, facet and ligamentum flavum hypertrophy. Milder spinal canal stenosis is seen at L3-L4. There is multilevel neural foraminal stenosis.   PATIENT SURVEYS:  ABC scale 710 / 1600 = 44.4 % Modified Oswestry 24 / 50 = 48.0 %   SCREENING FOR RED  FLAGS: Bowel or bladder incontinence: Yes: chronic bladder issues, better bowel control since surgery Spinal tumors: No Cauda equina syndrome: No  Compression fracture: No Abdominal aneurysm: No  COGNITION:  Overall cognitive status: Within functional limits for tasks assessed    SENSATION: Light touch: Impaired - top of R foot Proprioception: Impaired    MUSCLE LENGTH: Hamstrings: mild/mod tight L>R ITB: mild tight B Piriformis: mod tight L>R Hip IR: mod tight B Hip flexors: WFL Quads: mild tight R>L Heelcord: mild/mod tight B  POSTURE:  rounded shoulders, forward head, right pelvic obliquity, and scoliosis  LUMBAR ROM: *ROM assessment limited in standing due to balance impairments  Active  Eval *  Flexion Hands to mid shin  Extension 50% limited  Right lateral flexion Hand to mid thigh  Left lateral flexion Hand to mid thigh  Right rotation 50% limited  Left rotation 50% limited  (Blank rows = not tested)  LOWER EXTREMITY ROM:    Grossly WFL other than limitations indicated above due to muscle tightness  LOWER EXTREMITY MMT:    MMT Right eval Left eval R 09/16/23 L 09/16/23  Hip flexion 3 3 4- 4-  Hip extension 4 4 4+ 4+  Hip abduction 4- 4- 4- 4  Hip adduction 3+ 3+ 3+ 3+  Hip internal rotation 4- 3+ 4+ 4  Hip external rotation 3 3 4- 3+  Knee flexion 5 5 5 5   Knee extension 4+ 4+ 4+ 4+  Ankle dorsiflexion 3 3- 3+ 3-  Ankle plantarflexion 2 2 2+ 2+  Ankle inversion 3 3- 3+ 3-  Ankle eversion 4- 3+ 4 4-   (Blank rows = not tested)  FUNCTIONAL TESTS:  5 times sit to stand: 11.50 sec Timed up and go (TUG): 14.00 sec with single hiking pole; 12.25 sec w/o AD - 08/12/23 10 meter walk test: 14.22 sec with single walking/trekking pole; Gait speed = 2.31 ft/sec Berg Balance Scale: 31/56; < 36 high risk for falls (close to 100%) - 08/12/23 Functional gait assessment: 11/30; < 19 = high risk fall - 08/12/23   Standardized testing results as of discharge from latest PT  episode (12/31/22): 5xSTS: 10.72 sec Berg: 48/56; 46-51 moderate risk for falls (>50%)  : 16.15 sec w/o AD Gait speed: 2.03 ft/sec FGA: 19/30; 19-24 = medium risk fall   GAIT: Distance walked: Clinic distances Assistive device utilized:  Single walking/trekking pole on R Level of assistance: SBA Gait pattern:  Increased sway, step through pattern, decreased stride length, trendelenburg, lateral hip instability, poor foot clearance- Right, and poor foot clearance- Left Comments: L>R foot slap   TODAY'S TREATMENT:   09/15/23 THERAPEUTIC EXERCISE: to improve flexibility, strength and mobility.  Verbal and tactile cues throughout for technique.  Rec bike - L3 x 6 min (seat #2)  NEUROMUSCULAR RE-EDUCATION: To improve posture, balance, proprioception, coordination, reduce fall risk, and amplitude of movement. PWR! Sit to stand 2 x 10 with emphasis on PWR! Up up achieving upright stance - back of chair in front of patient for intermittent UE support as needed [employer] pews focusing on control of ant/post weight shift x 20 - standing in front of mat table with back of chair in front of patient for intermittent UE support as needed Wall bumps: Hip posterior x 10 Shoulders posterior x 5 Lateral hip x 5-10 bil  THERAPEUTIC ACTIVITIES: LE MMT   09/11/23 THERAPEUTIC EXERCISE: to improve flexibility, strength and mobility.  Verbal and tactile cues throughout for technique.  Rec bike - L3 x 4 min, L2 x 2 min (seat #2)  NEUROMUSCULAR RE-EDUCATION: To improve posture, balance, proprioception, coordination, reduce fall risk, amplitude  of movement, and reduce rigidity.  PWR! Step forward between 2 chairs with one hand on back of each chair x 10 with each foot PWR! Step back between 2 chairs with one hand on back of each chair x 10 with each foot PWR! Step through forward and back between 2 chairs with one hand on back of each chair x 10 with each foot Unsupported side-stepping with back to wall 4  x 10 ft bilaterally - 1 rep trying to use PWR! Side-step pattern (arms spreading with lateral step returning to side as feet come back together)   08/28/23 THERAPEUTIC EXERCISE: to improve flexibility, strength and mobility.  Verbal and tactile cues throughout for technique.  Rec bike - L3-2 x 6 min (seat #2) Seated figure-4 piriformis stretch with foot resting on yoga block on 9 stool + slight fwd hip hinge (multiple variations attempted as an alternative to side-sitting hip hinge piriformis stretch prior to determining best stretch with this version)  NEUROMUSCULAR RE-EDUCATION: To improve posture, balance, proprioception, coordination, reduce fall risk, amplitude of movement, and reduce rigidity. Standing PWR! Moves (set up with back of chair in front of patient while patient standing in front of mat table for safety, SBA/CGA of PT for all patterns): Up 2 x 10 - coordinating UE reach to counterbalance squat progressing into shoulder extension for full upright posture upon return to stand - pt still experiencing some repeated posterior LOB on initial attempts requiring unplanned sit down on mat table, but eventually better able to regulate eccentric to concentric transition Rock x 10 alternating sides - trailing hand remaining on back of chair for balance for most repetitions Twist x 10 - modified with 1 hand remaining on back of chair while patient rotating torso and reaching 180 behind Step x 10 each to L & R separately, x 10 B alternating sides - trailing hand remaining on back of chair for balance PWR! Step back on L - pt lost control on descent lowering in to deep knee bend on L LE requiring PT assist to recover to standing, therefore deferred further attempts and assisted pt back to sitting on treatment table PWR! Step forward between 2 chairs with one hand on back of each chair   08/26/23 THERAPEUTIC EXERCISE: to improve flexibility, strength and mobility.  Verbal and tactile cues  throughout for technique.  Rec bike - L3-2 x 6 min (seat #2)  NEUROMUSCULAR RE-EDUCATION: To improve posture, balance, proprioception, coordination, reduce fall risk, and amplitude of movement.  SLS + 4-way star taps to colored dots x 10 bil Standing PWR! Moves (set up with back of chair in front of patient while patient standing in front of mat table for safety, SBA/CGA of PT for all patterns): Up 2 x 10 - patient with repeated posterior LOB on initial attempts requiring a few unplanned sit downs on mat table (although less than last visit), but eventually better able to regulate eccentric to concentric transition Rock - lateral weight shift x 10 each to L & R separately (cues to keep knees straight and use toes to push off on trailing leg while elongating body in direction of reach), x 10 B alternating sides - trailing hand remaining on back of chair for balance for most repetitions Twist x 10 - modified with 1 hand remaining on back of chair while patient rotating torso and reaching 180 behind Step x 10 each to L & R separately, x 10 B alternating sides - trailing hand remaining on back of  chair for balance   PATIENT EDUCATION:  Education details: continue with current HEP Person educated: Patient Education method: Explanation Education comprehension: verbalized understanding and needs further education  HOME EXERCISE PROGRAM: Access Code: [email or web address] URL: [email or web address] Date: 08/26/2023 Prepared by: Elijah Hidden  Exercises - Standing Gastroc Stretch at Counter  - 1-2 x daily - 7 x weekly - 3 reps - 30 sec hold - Standing Gastroc Stretch on Foam 1/2 Roll  - 1-2 x daily - 7 x weekly - 3 reps - 30 sec hold - Standing Hip Flexor Stretch  - 1-2 x daily - 7 x weekly - 3 reps - 30 sec hold - Hip Flexor Stretch with Strap on Table  - 1-2 x daily - 7 x weekly - 3 reps - 30 sec hold - Hooklying Hamstring Stretch with Strap  - 1-2 x daily - 7 x weekly - 3 reps - 30 sec  hold - Supine Iliotibial Band Stretch with Strap  - 1-2 x daily - 7 x weekly - 3 reps - 30 sec hold - Seated Table Piriformis Stretch  - 1-2 x daily - 7 x weekly - 3 reps - 30 sec hold - Standing 3-way Hip with Walker  - 1 x daily - 3 x weekly - 2 sets - 10 reps - 3 sec hold - Hip Hiking on Step  - 1 x daily - 3 x weekly - 2 sets - 10 reps - 3 sec hold - Clam with Resistance  - 1 x daily - 3 x weekly - 2 sets - 10 reps - 3-5 sec hold - Seated March with Resistance  - 1 x daily - 3 x weekly - 2 sets - 10 reps - 3 sec hold - Corner Balance Feet Together: Eyes Open With Head Turns  - 1 x daily - 7 x weekly - 2 sets - 5 reps  Standing PWR! Moves   HEP from most recent PT episode: Access Code: [email or web address] URL: [email or web address] Date: 11/28/2022 Prepared by: Elijah Hidden   Exercises - Seated Heel Toe Raises  - 1 x daily - 7 x weekly - 2 sets - 10 reps - 3 sec hold - Standing Isometric Hip Abduction with Ball on Wall  - 1 x daily - 7 x weekly - 2 sets - 10 reps - 3 sec hold - Seated Transversus Abdominis Bracing  - 2 x daily - 7 x weekly - 2 sets - 10 reps - 3-5 sec hold - Seated Isometric Hip Abduction with Resistance  - 1 x daily - 3 x weekly - 2 sets - 10 reps - 3 sec hold - Seated March with Resistance  - 1 x daily - 3 x weekly - 2 sets - 10 reps - 3 sec hold - Seated Shoulder Row with Anchored Resistance  - 1 x daily - 3 x weekly - 2 sets - 10 reps - 3-5 hold hold - Seated Single Arm Shoulder Row with Anchored Resistance  - 1 x daily - 3 x weekly - 2 sets - 10 reps - 3 sec hold - Seated Anti-Rotation Press With Anchored Resistance  - 1 x daily - 3 x weekly - 2 sets - 10 reps - 3 sec hold - Tall Kneeling Hip Hinge  - 1 x daily - 3-4 x weekly - 2 sets - 10 reps - 3 sec hold   Quadruped PWR! Up & Step   ASSESSMENT:  CLINICAL IMPRESSION: Elowen reports finds herself frequently relying on  leaning forward against objects such as vanity or countertop to provide balance  stability while performing standing tasks, therefore today's session focusing on static balance control without external support encouraging hip and ankle strategies in place of stepping strategies for balance correction with carryover into static stance stability upon completion of sit to stand transitions.  She struggles with fine-tuning of control with weight shift and hip and ankle muscle activation to maintain static stance and avoid over balancing.  Safiyah is demonstrating progress with PT but continues to have significant weakness and balance deficits and will benefit from continued skilled PT to address ongoing deficits to improve mobility and activity tolerance with improved stability and balance to reduce fall risk.  Will continue with standardized balance testing and goal reassessment over next few visits in preparation for recertification at the end of our current episode.  OBJECTIVE IMPAIRMENTS: Abnormal gait, decreased activity tolerance, decreased balance, decreased coordination, decreased endurance, decreased mobility, difficulty walking, decreased ROM, decreased strength, decreased safety awareness, increased fascial restrictions, impaired perceived functional ability, increased muscle spasms, impaired flexibility, impaired sensation, impaired tone, improper body mechanics, and postural dysfunction.   ACTIVITY LIMITATIONS: carrying, lifting, bending, sitting, standing, squatting, stairs, transfers, bed mobility, bathing, toileting, dressing, reach over head, hygiene/grooming, locomotion level, and caring for others  PARTICIPATION LIMITATIONS: meal prep, cleaning, laundry, driving, shopping, community activity, yard work, and church  PERSONAL FACTORS: Age, Past/current experiences, Time since onset of injury/illness/exacerbation, and 3+ comorbidities: L2-3 XLIF/PSF 05/24/22; C5-7 ACDF 09/21/20; idiopathic progressive neuropathy; hypthyroidism; DDD; remote h/o back surgery at age 51; h/o  scoliosis; Takotsubo cardiomyopathy 01/10/22  are also affecting patient's functional outcome.   REHAB POTENTIAL: Good  CLINICAL DECISION MAKING: Evolving/moderate complexity  EVALUATION COMPLEXITY: Moderate   GOALS: Goals reviewed with patient? Yes  SHORT TERM GOALS: Target date: 08/26/2023  Patient will be independent with initial HEP to improve outcomes and carryover.  Baseline:  Goal status: MET - 08/21/23  2.  Complete balance assessment and update LTG's as indicated.  Baseline:  Goal status: MET - 08/12/23   LONG TERM GOALS: Target date: 09/23/2023  Patient will be independent with ongoing/advanced HEP for self-management at home.  Baseline:  Goal status: IN PROGRESS - 09/11/23   2.  Patient to demonstrate ability to achieve and maintain good spinal alignment/posturing and body mechanics needed for daily activities. Baseline:  Goal status: IN PROGRESS - 09/11/23 - Pt aware of good posture and body mechanics but limited at times by impaired balance  3.  Patient will demonstrate functional pain free lumbar ROM to perform ADLs.   Baseline: Refer to above lumbar ROM table Goal status: IN PROGRESS  4.  Patient will demonstrate improved B proximal LE strength to >/= 4 to 4+/5 for improved stability and ease of mobility . Baseline: Refer to above LE MMT table Goal status: IN PROGRESS - 09/16/23 - gains noted in overall B hip strength  5.  Patient will demonstrate improved B ankle strength to >/= 3+ to 4-/5 for improved gait stability with decreased foot slap.  Baseline: Refer to above LE MMT table Goal status: IN PROGRESS - 09/16/23 - majority of ankle motions with at least 1 muscle grade improvement in strength, excluding L ankle DF and inversion  6.  Patient will report >/= 60% on ABC scale to demonstrate improved balance confidence.  Baseline: 710 / 1600 = 44.4 % Goal status: IN PROGRESS   7. Patient will report </= 36% on Modified Oswestry to demonstrate improved functional  ability with  decreased pain interference. Baseline: 24 / 50 = 48.0 % Goal status: IN PROGRESS  8.  Patient will improve gait velocity to at least 2.62 ft/sec for improved gait efficiency and safety with community ambulation. Baseline: 2.31 ft/sec with single walking/trekking pole on R Goal status: IN PROGRESS  9.  Patient will improve Berg score to >/= 39/56 (8 points) to improve safety and stability with ADLs in standing and reduce risk for falls.  Baseline: 31/56 Goal status: IN PROGRESS  10.  Patient will improve FGA score to >/= 15/30 (4 points) to improve gait stability and reduce risk for falls.  Baseline: 11/30 Goal status: IN PROGRESS    PLAN:  PT FREQUENCY: 2x/week  PT DURATION: 8 weeks  PLANNED INTERVENTIONS: 97164- PT Re-evaluation, 97110-Therapeutic exercises, 97530- Therapeutic activity, 97112- Neuromuscular re-education, 97535- Self Care, [id number]- Manual therapy, [phone]- Gait training, 97014- Electrical stimulation (unattended), [phone]- Electrical stimulation (manual), [phone]- Ionotophoresis 4mg /ml Dexamethasone, Patient/Family education, Balance training, Stair training, Taping, Dry Needling, Joint mobilization, DME instructions, Cryotherapy, and Moist heat  PLAN FOR NEXT SESSION: 10th visit PN/Recert - complete reassessment of standardized balance testing (10MWT/gait speed, Berg and FGA) and LTG goal assessment; primary focus on balance training and gait stability; progress lumbopelvic/LE strengthening and core stabilization; review & HEP PRN   Elijah CHRISTELLA Hidden, PT 09/16/2023, 7:03 PM

## 2023-09-18 ENCOUNTER — Ambulatory Visit: Payer: Medicare PPO | Admitting: Physical Therapy

## 2023-09-18 ENCOUNTER — Encounter: Payer: Self-pay | Admitting: Physical Therapy

## 2023-09-18 DIAGNOSIS — M6281 Muscle weakness (generalized): Secondary | ICD-10-CM

## 2023-09-18 DIAGNOSIS — R2681 Unsteadiness on feet: Secondary | ICD-10-CM

## 2023-09-18 DIAGNOSIS — R2689 Other abnormalities of gait and mobility: Secondary | ICD-10-CM

## 2023-09-18 DIAGNOSIS — M21371 Foot drop, right foot: Secondary | ICD-10-CM

## 2023-09-18 DIAGNOSIS — M21372 Foot drop, left foot: Secondary | ICD-10-CM

## 2023-09-18 NOTE — Therapy (Signed)
 OUTPATIENT PHYSICAL THERAPY TREATMENT & RE-CERTIFICATION  Progress Note  Reporting Period 07/29/2023 to 09/18/2023  See note below for Objective Data and Assessment of Progress/Goals.     Patient Name: [name] Trujillo MRN: [id number] DOB:Aug 15, 1941, 83 y.o., female Today's Date: 09/18/2023  END OF SESSION:  PT End of Session - 09/18/23 1400     Visit Number 10    Date for PT Re-Evaluation 11/13/23    Authorization Type [employer]    Authorization Time Period 09/11/23 - 10/23/23    Authorization - Visit Number 3    Authorization - Number of Visits 8    Progress Note Due on Visit 20    PT Start Time 1400    PT Stop Time 1450    PT Time Calculation (min) 50 min    Activity Tolerance Patient tolerated treatment well    Behavior During Therapy [facility] for tasks assessed/performed                    Past Medical History:  Diagnosis Date   High cholesterol    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   She had ACUTE TAKOTSUBO (STRESS) CARDIOMYOPATHY with elevated Troponin Levels - this would be considered Demand Ischemia - Demand Infarction & NOT associated with ACS/CAD.     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  - Cath with NO CAD. ==> RESOLVED: f/u Echo 03/2022: EF 60-65%.   Past Surgical History:  Procedure Laterality Date   APPEN[age]CTOMY     64-18 yo   BACK SURGERY     Age 14   CESAREAN SECTION     ECTOPIC PREGNANCY SURGERY     LAPAROSCOPIC HYSTERECTOMY     LEFT HEART CATH AND CORONARY ANGIOGRAPHY N/A 01/09/2022   Procedure: LEFT HEART CATH AND CORONARY ANGIOGRAPHY;  Surgeon: Wonda Sharper, MD;  Location: [facility] INVASIVE CV LAB;  Service: CV:: Widely patent coronaries  with mild nonobstructive LAD plaquing.  Right dominant system.  Normal LVEDP.  Based on clinical presentation, findings are consistent with acute Takotsubo Cardiomyopathy Syndrome   POSTERIOR FUSION LUMBAR SPINE  05/24/2022   [facility], [place]; [name], MD): L2-L3 XLIF, L2-L3 POSTERIOR DECOMPRESSION AND FUSION   TRANSTHORACIC ECHOCARDIOGRAM  01/08/2022   Severely decreased LV function-EF 25 to 30%.  Mid to apical (mostly anterior) with normal basal motion.  GR 2 DD-elevated LAP.  Mildly dilated LA.  Aortic sclerosis with no stenosis.  No AI.  Normal MV with mild to moderate TR.  Mildly elevated RAP, and PAP (estimated 49 mg). If LAD CAD ruled out - consistent with Takotsubo CM Syndrome.   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    PCP: Windy Coy, MD  REFERRING PROVIDER: Tobie Raisin, NP   REFERRING DIAG: Z98.1 (ICD-10-CM) - S/p lumbar spinal fusion  Eval and treat for: s/p lumbar fusion, lower extremity strengthening  THERAPY DIAG:  Muscle weakness (generalized)  Other abnormalities of gait and mobility  Unsteadiness on feet  Foot drop, left  Foot drop, right  RATIONALE FOR EVALUATION AND TREATMENT: Rehabilitation  ONSET DATE: 05/24/2022 - L2-L3 XLIF/PSF   NEXT MD VISIT: 12/31/2023 with Norleen [name], MD (Surgeon)   SUBJECTIVE:                                                                                                                                                                                                          SUBJECTIVE STATEMENT: Pt finds herself still having to lean forward against things (vanity, counter) to keep her balance.  PAIN: Are you having pain? No  PERTINENT HISTORY:  L2-3 XLIF/PSF 05/24/22; C5-7 ACDF 09/21/20; idiopathic progressive neuropathy; hypthyroidism; DDD; remote h/o back surgery at age 20; h/o scoliosis; Takotsubo cardiomyopathy 01/10/22   PRECAUTIONS: Fall  RED FLAGS: Bowel or bladder incontinence: Yes: chronic bladder issues, better bowel control since surgery  WEIGHT BEARING RESTRICTIONS: No  FALLS:  Has patient fallen in last 6 months? No  LIVING ENVIRONMENT: Lives with: lives with their spouse Lives in: House/apartment Stairs: Yes: External: 5-6 steps; on left going up Has following equipment at home: Walker - 2 wheeled, shower chair, and hiking/walking poles  OCCUPATION: Retired runner, broadcasting/film/video   PLOF: Independent and Leisure: walking ~30 minutes 1x/day (3x around the court near where she lives); sewing/quilting; likes working in the yard but has not been able to do so due to her balance      PATIENT GOALS: Improved balance (to feel more comfortable in a crowd and walk more normally).   OBJECTIVE: (objective measures completed at initial evaluation unless otherwise dated)  DIAGNOSTIC FINDINGS:  06/30/23 - XR Lumbar spine: Stable postoperative changes of the lumbar spine with evidence of spinal fusion at L2-L3.   07/01/22 - Lumbar spine x-ray: Expected postoperative appearance related to L2-L3 fixation.    06/12/21 - Lumbar MRI: Postoperative and degenerative changes of the lumbar spine as described, similar compared to July 28, 2020. At L2-L3, there is moderate spinal canal stenosis due to disc bulging, endplate osteophytic spurring, facet and ligamentum flavum hypertrophy. Milder spinal canal stenosis is seen at L3-L4. There is multilevel neural foraminal stenosis.    PATIENT SURVEYS:  ABC scale 710 / 1600 = 44.4 % Modified Oswestry 24 / 50 = 48.0 %  - Patient reports her answers  are based on difficulty performing the activities related to balance more so than pain  09/18/23: ABC Scale: 470 / 1600 = 29.4 % Modified Oswestry: 26 / 50 = 52.0 % - Patient reports her answers are based on difficulty performing the activities related to balance more so than pain  SCREENING FOR RED FLAGS: Bowel or bladder incontinence: Yes: chronic bladder issues, better bowel control since surgery Spinal tumors: No Cauda equina syndrome: No Compression fracture: No Abdominal aneurysm: No  COGNITION:  Overall cognitive status: Within functional limits for tasks assessed    SENSATION: Light touch: Impaired - top of R foot Proprioception: Impaired    MUSCLE LENGTH: Hamstrings: mild/mod tight L>R ITB: mild tight B Piriformis: mod tight L>R Hip IR: mod tight B Hip flexors: WFL Quads: mild tight R>L Heelcord: mild/mod tight B  POSTURE:  rounded shoulders, forward head, right pelvic obliquity, and scoliosis  LUMBAR ROM: *ROM assessment limited in standing due to balance impairments  Active  Eval *  Flexion Hands to mid shin  Extension 50% limited  Right lateral flexion Hand to mid thigh  Left lateral flexion Hand to mid thigh  Right rotation 50% limited  Left rotation 50% limited  (Blank rows = not tested)  LOWER EXTREMITY ROM:    Grossly WFL other than limitations indicated above due to muscle tightness  LOWER EXTREMITY MMT:    MMT Right eval Left eval R 09/16/23 L 09/16/23  Hip flexion 3 3 4- 4-  Hip extension 4 4 4+ 4+  Hip abduction 4- 4- 4- 4  Hip adduction 3+ 3+ 3+ 3+  Hip internal rotation 4- 3+ 4+ 4  Hip external rotation 3 3 4- 3+  Knee flexion 5 5 5 5   Knee extension 4+ 4+ 4+ 4+  Ankle dorsiflexion 3 3- 3+ 3-  Ankle plantarflexion 2 2 2+ 2+  Ankle inversion 3 3- 3+ 3-  Ankle eversion 4- 3+ 4 4-   (Blank rows = not tested)  FUNCTIONAL  TESTS:  5 times sit to stand: 11.50 sec Timed up and go (TUG): 14.00 sec with single hiking pole; 12.25 sec w/o AD - 08/12/23 10 meter walk test: 14.22 sec with single walking/trekking pole; Gait speed = 2.31 ft/sec Berg Balance Scale: 31/56; < 36 high risk for falls (close to 100%) - 08/12/23 Functional gait assessment: 11/30; < 19 = high risk fall - 08/12/23   09/18/23 = 14.12 sec with single hiking pole; 14.69 sec w/o AD Gait speed = 2.32 ft/sec with single hiking pole; 2.23 ft/sec w/o AD Berg = 38/56, 37-45 significant risk for falls (>80%)  FGA = 15/30, < 19 = high risk fall   Standardized testing results as of discharge from latest PT episode (12/31/22): 5xSTS: 10.72 sec Berg: 48/56; 46-51 moderate risk for falls (>50%)  : 16.15 sec w/o AD Gait speed: 2.03 ft/sec FGA: 19/30; 19-24 = medium risk fall   GAIT: Distance walked: Clinic distances Assistive device utilized:  Single walking/trekking pole on R Level of assistance: SBA Gait pattern:  Increased sway, step through pattern, decreased stride length, trendelenburg, lateral hip instability, poor foot clearance- Right, and poor foot clearance- Left Comments: L>R foot slap   TODAY'S TREATMENT:   09/18/23 - Recert THERAPEUTIC EXERCISE: to improve flexibility, strength and mobility.  Verbal and tactile cues throughout for technique.  Rec bike - L3 x 6 min (seat #2)  THERAPEUTIC ACTIVITIES: ABC Scale: 470 / 1600 = 29.4 % Modified Oswestry: 26 / 50 = 52.0 % -  Patient reports her answers are based on difficulty performing the activities related to balance more so than pain Berg = 38/56, 37-45 significant risk for falls (>80%)  = 14.12 sec with single hiking pole; 14.69 sec w/o AD Gait speed = 2.32 ft/sec with single hiking pole; 2.23 ft/sec w/o AD FGA = 15/30, < 19 = high risk fall  Goal assessment    09/15/23 THERAPEUTIC EXERCISE: to improve flexibility, strength and mobility.  Verbal and tactile cues throughout  for technique.  Rec bike - L3 x 6 min (seat #2)  NEUROMUSCULAR RE-EDUCATION: To improve posture, balance, proprioception, coordination, reduce fall risk, and amplitude of movement. PWR! Sit to stand 2 x 10 with emphasis on PWR! Up up achieving upright stance - back of chair in front of patient for intermittent UE support as needed Church pews focusing on control of ant/post weight shift x 20 - standing in front of mat table with back of chair in front of patient for intermittent UE support as needed Wall bumps: Hip posterior x 10 Shoulders posterior x 5 Lateral hip x 5-10 bil  THERAPEUTIC ACTIVITIES: LE MMT   09/11/23 THERAPEUTIC EXERCISE: to improve flexibility, strength and mobility.  Verbal and tactile cues throughout for technique.  Rec bike - L3 x 4 min, L2 x 2 min (seat #2)  NEUROMUSCULAR RE-EDUCATION: To improve posture, balance, proprioception, coordination, reduce fall risk, amplitude of movement, and reduce rigidity.  PWR! Step forward between 2 chairs with one hand on back of each chair x 10 with each foot PWR! Step back between 2 chairs with one hand on back of each chair x 10 with each foot PWR! Step through forward and back between 2 chairs with one hand on back of each chair x 10 with each foot Unsupported side-stepping with back to wall 4 x 10 ft bilaterally - 1 rep trying to use PWR! Side-step pattern (arms spreading with lateral step returning to side as feet come back together)   PATIENT EDUCATION:  Education details: progress with PT, ongoing PT POC, and continue with current HEP Person educated: Patient Education method: Explanation Education comprehension: verbalized understanding  HOME EXERCISE PROGRAM: Access Code: [email or web address] URL: [email or web address] Date: 08/26/2023 Prepared by: Elijah Hidden  Exercises - Standing Gastroc Stretch at Counter  - 1-2 x daily - 7 x weekly - 3 reps - 30 sec hold - Standing Gastroc Stretch on Foam 1/2 Roll  - 1-2  x daily - 7 x weekly - 3 reps - 30 sec hold - Standing Hip Flexor Stretch  - 1-2 x daily - 7 x weekly - 3 reps - 30 sec hold - Hip Flexor Stretch with Strap on Table  - 1-2 x daily - 7 x weekly - 3 reps - 30 sec hold - Hooklying Hamstring Stretch with Strap  - 1-2 x daily - 7 x weekly - 3 reps - 30 sec hold - Supine Iliotibial Band Stretch with Strap  - 1-2 x daily - 7 x weekly - 3 reps - 30 sec hold - Seated Table Piriformis Stretch  - 1-2 x daily - 7 x weekly - 3 reps - 30 sec hold - Standing 3-way Hip with Walker  - 1 x daily - 3 x weekly - 2 sets - 10 reps - 3 sec hold - Hip Hiking on Step  - 1 x daily - 3 x weekly - 2 sets - 10 reps - 3 sec hold - Clam with Resistance  - 1 x daily -  3 x weekly - 2 sets - 10 reps - 3-5 sec hold - Seated March with Resistance  - 1 x daily - 3 x weekly - 2 sets - 10 reps - 3 sec hold - Corner Balance Feet Together: Eyes Open With Head Turns  - 1 x daily - 7 x weekly - 2 sets - 5 reps  Standing PWR! Moves   HEP from most recent PT episode: Access Code: [email or web address] URL: [email or web address] Date: 11/28/2022 Prepared by: Elijah Hidden   Exercises - Seated Heel Toe Raises  - 1 x daily - 7 x weekly - 2 sets - 10 reps - 3 sec hold - Standing Isometric Hip Abduction with Ball on Wall  - 1 x daily - 7 x weekly - 2 sets - 10 reps - 3 sec hold - Seated Transversus Abdominis Bracing  - 2 x daily - 7 x weekly - 2 sets - 10 reps - 3-5 sec hold - Seated Isometric Hip Abduction with Resistance  - 1 x daily - 3 x weekly - 2 sets - 10 reps - 3 sec hold - Seated March with Resistance  - 1 x daily - 3 x weekly - 2 sets - 10 reps - 3 sec hold - Seated Shoulder Row with Anchored Resistance  - 1 x daily - 3 x weekly - 2 sets - 10 reps - 3-5 hold hold - Seated Single Arm Shoulder Row with Anchored Resistance  - 1 x daily - 3 x weekly - 2 sets - 10 reps - 3 sec hold - Seated Anti-Rotation Press With Anchored Resistance  - 1 x daily - 3 x weekly - 2 sets - 10 reps -  3 sec hold - Tall Kneeling Hip Hinge  - 1 x daily - 3-4 x weekly - 2 sets - 10 reps - 3 sec hold   Quadruped PWR! Up & Step   ASSESSMENT:  CLINICAL IMPRESSION: Kaisa feels that her strength and balance have been slower to recover this episode with modified Oswestry (she reports her answers are based on difficulty performing the activities related to balance more so than pain) and ABC scale reflecting her perceptions, however gains noted across MMT and standardized balance testing.  Berg improved by 7 points from 31/56 to 38/56 indicating a reduction in her fall risk from high (close to 100%) to significant (>80%).  FGA improved by 4 points from 11/30 to 15/30 still indicating a high risk for falls, however patient demonstrates potential for further improvement based on status at discharge from previous PT episode with associated LTG (#10) updated to reflect potential for further improvement.  Gait speed essentially unchanged from eval with speed with and without assistive device relatively comfortable.  Allisen is demonstrating progress with PT and remains motivated to further improve her strength, balance and quality of mobility, and will benefit from recertification for additional skilled PT 2x/wk for up to 8 weeks to address ongoing weakness and balance deficits to improve mobility and activity tolerance with improved stability and balance to reduce fall risk.    OBJECTIVE IMPAIRMENTS: Abnormal gait, decreased activity tolerance, decreased balance, decreased coordination, decreased endurance, decreased mobility, difficulty walking, decreased ROM, decreased strength, decreased safety awareness, increased fascial restrictions, impaired perceived functional ability, increased muscle spasms, impaired flexibility, impaired sensation, impaired tone, improper body mechanics, and postural dysfunction.   ACTIVITY LIMITATIONS: carrying, lifting, bending, sitting, standing, squatting, stairs, transfers, bed  mobility, bathing, toileting, dressing, reach over head, hygiene/grooming, locomotion level, and caring for  others  PARTICIPATION LIMITATIONS: meal prep, cleaning, laundry, driving, shopping, community activity, yard work, and church  PERSONAL FACTORS: Age, Past/current experiences, Time since onset of injury/illness/exacerbation, and 3+ comorbidities: L2-3 XLIF/PSF 05/24/22; C5-7 ACDF 09/21/20; idiopathic progressive neuropathy; hypthyroidism; DDD; remote h/o back surgery at age 49; h/o scoliosis; Takotsubo cardiomyopathy 01/10/22  are also affecting patient's functional outcome.   REHAB POTENTIAL: Good  CLINICAL DECISION MAKING: Evolving/moderate complexity  EVALUATION COMPLEXITY: Moderate   GOALS: Goals reviewed with patient? Yes  SHORT TERM GOALS: Target date: 08/26/2023  Patient will be independent with initial HEP to improve outcomes and carryover.  Baseline:  Goal status: MET - 08/21/23  2.  Complete balance assessment and update LTG's as indicated.  Baseline:  Goal status: MET - 08/12/23   LONG TERM GOALS: Target date: 09/23/2023, extended to 11/13/2023  Patient will be independent with ongoing/advanced HEP for self-management at home.  Baseline:  Goal status: PARTIALLY MET - 09/11/23 - Met for current HEP; further updates anticipated  2.  Patient to demonstrate ability to achieve and maintain good spinal alignment/posturing and body mechanics needed for daily activities. Baseline:  Goal status: IN PROGRESS - 09/11/23 - Pt aware of good posture and body mechanics but limited at times by impaired balance  3.  Patient will demonstrate functional pain free lumbar ROM to perform ADLs.   Baseline: Refer to above lumbar ROM table Goal status: IN PROGRESS   4.  Patient will demonstrate improved B proximal LE strength to >/= 4 to 4+/5 for improved stability and ease of mobility . Baseline: Refer to above LE MMT table Goal status: IN PROGRESS - 09/16/23 - gains noted in overall B hip  strength  5.  Patient will demonstrate improved B ankle strength to >/= 3+ to 4-/5 for improved gait stability with decreased foot slap.  Baseline: Refer to above LE MMT table Goal status: IN PROGRESS - 09/16/23 - majority of ankle motions with at least 1 muscle grade improvement in strength, excluding L ankle DF and inversion  6.  Patient will report >/= 60% on ABC scale to demonstrate improved balance confidence.  Baseline: 710 / 1600 = 44.4 % Goal status: IN PROGRESS - 09/18/23 - 470 / 1600 = 29.4 %  7. Patient will report </= 36% on Modified Oswestry to demonstrate improved functional ability with decreased pain interference. Baseline: 24 / 50 = 48.0 % Goal status: IN PROGRESS - 09/18/23 - 26 / 50 = 52.0 %  8.  Patient will improve gait velocity to at least 2.62 ft/sec for improved gait efficiency and safety with community ambulation. Baseline: 2.31 ft/sec with single walking/trekking pole on R Goal status: IN PROGRESS - 09/18/23 - 2.32 ft/sec with single hiking pole; 2.23 ft/sec w/o AD  9.  Patient will improve Berg score to >/= 39/56 (8 points) to improve safety and stability with ADLs in standing and reduce risk for falls.  Baseline: 31/56 Goal status: IN PROGRESS - 09/18/23 - 38/56  10.  Patient will improve FGA score to >/= 15/30 (4 points) to improve gait stability and reduce risk for falls.  Baseline: 11/30 Goal status: MET & REVISED (see 10a. Below)- 09/18/23 - 15/30  10a.  Patient will improve FGA score to >/= 19/30 (baseline as of prior D/C from PT) to improve gait stability and reduce risk for falls.  Baseline: 11/30 on eval, 15/30 as of 09/18/23 Goal status: REVISED      PLAN:  PT FREQUENCY: 2x/week  PT DURATION: 8 weeks  PLANNED  INTERVENTIONS: 97164- PT Re-evaluation, 97110-Therapeutic exercises, 97530- Therapeutic activity, [id number]- Neuromuscular re-education, 97535- Self Care, [id number]- Manual therapy, [phone]- Gait training, 97014- Electrical stimulation (unattended), [phone]-  Electrical stimulation (manual), [phone]- Ionotophoresis 4mg /ml Dexamethasone, Patient/Family education, Balance training, Stair training, Taping, Dry Needling, Joint mobilization, DME instructions, Cryotherapy, and Moist heat  PLAN FOR NEXT SESSION: primary focus on balance training and gait stability; progress lumbopelvic/LE strengthening and core stabilization; review & HEP PRN   Elijah CHRISTELLA Hidden, PT 09/18/2023, 6:37 PM

## 2023-09-23 ENCOUNTER — Encounter: Payer: Self-pay | Admitting: Physical Therapy

## 2023-09-23 ENCOUNTER — Telehealth: Payer: Self-pay | Admitting: Cardiology

## 2023-09-23 ENCOUNTER — Ambulatory Visit: Payer: Medicare PPO | Admitting: Physical Therapy

## 2023-09-23 DIAGNOSIS — M6281 Muscle weakness (generalized): Secondary | ICD-10-CM

## 2023-09-23 DIAGNOSIS — R2689 Other abnormalities of gait and mobility: Secondary | ICD-10-CM

## 2023-09-23 DIAGNOSIS — M21372 Foot drop, left foot: Secondary | ICD-10-CM

## 2023-09-23 DIAGNOSIS — R2681 Unsteadiness on feet: Secondary | ICD-10-CM

## 2023-09-23 DIAGNOSIS — M21371 Foot drop, right foot: Secondary | ICD-10-CM

## 2023-09-23 NOTE — Telephone Encounter (Signed)
 Patient identification verified by 2 forms. [name] Cooks, RN    Called and spoke to patient  Patient states:   -has not been feeling good   -lots of fatigue, sleeps for 10-12 hours/nightly  -some SOB when walking, walks every afternoon  -has to stop and catch breath 2-3x when walking   -no SOB when sitting calmly   -she does not have swelling, no weight gain   -does not have BP cuff at home   -has had 2 episode of chest pain at night over the last 6 weeks, unsure if indigestion   -Would like appointment on 1/24 to discuss symptoms Patient denies:   -heart palpitations  Patient scheduled for OV 1/24 at 2:45pm (per patient request)  Reviewed ED warning signs/precautions  Patient verbalized understanding, no questions at this time

## 2023-09-23 NOTE — Therapy (Signed)
 OUTPATIENT PHYSICAL THERAPY TREATMENT     Patient Name: [name] Trujillo MRN: [id number] DOB:1940-10-22, 83 y.o., female Today's Date: 09/23/2023  END OF SESSION:  PT End of Session - 09/23/23 1312     Visit Number 11    Date for PT Re-Evaluation 11/13/23    Authorization Type [employer]    Authorization Time Period 09/11/23 - 10/23/23    Authorization - Visit Number 4    Authorization - Number of Visits 8    Progress Note Due on Visit 20    PT Start Time 1314    PT Stop Time 1359    PT Time Calculation (min) 45 min    Activity Tolerance Patient tolerated treatment well    Behavior During Therapy [facility] for tasks assessed/performed                     Past Medical History:  Diagnosis Date   High cholesterol    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   She had ACUTE TAKOTSUBO (STRESS) CARDIOMYOPATHY with elevated Troponin Levels - this would be considered Demand Ischemia - Demand Infarction & NOT associated with ACS/CAD.     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  - Cath with NO CAD. ==> RESOLVED: f/u Echo 03/2022: EF 60-65%.   Past Surgical History:  Procedure Laterality Date   APPEN[age]CTOMY     63-18 yo   BACK SURGERY     Age 50   CESAREAN SECTION     ECTOPIC PREGNANCY SURGERY     LAPAROSCOPIC HYSTERECTOMY     LEFT HEART CATH AND CORONARY ANGIOGRAPHY N/A 01/09/2022   Procedure: LEFT HEART CATH AND CORONARY ANGIOGRAPHY;  Surgeon: Wonda Sharper, MD;  Location: [facility] INVASIVE CV LAB;  Service: CV:: Widely patent coronaries with mild nonobstructive LAD plaquing.  Right dominant system.  Normal LVEDP.  Based on clinical presentation, findings are consistent with acute  Takotsubo Cardiomyopathy Syndrome   POSTERIOR FUSION LUMBAR SPINE  05/24/2022   [facility], [place]; [name], MD): L2-L3 XLIF, L2-L3 POSTERIOR DECOMPRESSION AND FUSION   TRANSTHORACIC ECHOCARDIOGRAM  01/08/2022   Severely decreased LV function-EF 25 to 30%.  Mid to apical (mostly anterior) with normal basal motion.  GR 2 DD-elevated LAP.  Mildly dilated LA.  Aortic sclerosis with no stenosis.  No AI.  Normal MV with mild to moderate TR.  Mildly elevated RAP, and PAP (estimated 49 mg). If LAD CAD ruled out - consistent with Takotsubo CM Syndrome.   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    PCP: Windy Coy, MD   REFERRING PROVIDER: Tobie Raisin, NP   REFERRING DIAG: Z98.1 (ICD-10-CM) - S/p lumbar spinal fusion  Eval and treat  for: s/p lumbar fusion, lower extremity strengthening  THERAPY DIAG:  Muscle weakness (generalized)  Other abnormalities of gait and mobility  Unsteadiness on feet  Foot drop, left  Foot drop, right  RATIONALE FOR EVALUATION AND TREATMENT: Rehabilitation  ONSET DATE: 05/24/2022 - L2-L3 XLIF/PSF   NEXT MD VISIT: 12/31/2023 with Norleen [name], MD (Surgeon)   SUBJECTIVE:                                                                                                                                                                                                         SUBJECTIVE STATEMENT: Pt feels that she is starting to  be more steady and less tipsy.SABRA  PAIN: Are you having pain? No  PERTINENT HISTORY:  L2-3 XLIF/PSF 05/24/22; C5-7 ACDF 09/21/20; idiopathic progressive neuropathy; hypthyroidism; DDD; remote h/o back surgery at age 57; h/o scoliosis; Takotsubo cardiomyopathy 01/10/22   PRECAUTIONS: Fall  RED FLAGS: Bowel or bladder incontinence: Yes: chronic bladder issues, better bowel control since surgery  WEIGHT BEARING RESTRICTIONS: No  FALLS:  Has patient fallen in last 6 months? No  LIVING ENVIRONMENT: Lives with: lives with their spouse Lives in: House/apartment Stairs: Yes: External: 5-6 steps; on left going up Has following equipment at home: Walker - 2 wheeled, shower chair, and hiking/walking poles  OCCUPATION: Retired runner, broadcasting/film/video   PLOF: Independent and Leisure: walking ~30 minutes 1x/day (3x around the court near where she lives); sewing/quilting; likes working in the yard but has not been able to do so due to her balance      PATIENT GOALS: Improved balance (to feel more comfortable in a crowd and walk more normally).   OBJECTIVE: (objective measures completed at initial evaluation unless otherwise dated)  DIAGNOSTIC FINDINGS:  06/30/23 - XR Lumbar spine: Stable postoperative changes of the lumbar spine with evidence of spinal fusion at L2-L3.   07/01/22 - Lumbar spine x-ray: Expected postoperative appearance related to L2-L3 fixation.    06/12/21 - Lumbar MRI: Postoperative and degenerative changes of the lumbar spine as described, similar compared to July 28, 2020. At L2-L3, there is moderate spinal canal stenosis due to disc bulging, endplate osteophytic spurring, facet and ligamentum flavum hypertrophy. Milder spinal canal stenosis is seen at L3-L4. There is multilevel neural foraminal stenosis.   PATIENT SURVEYS:  ABC scale 710 / 1600 = 44.4 % Modified Oswestry 24 / 50 = 48.0 %  - Patient reports her answers are based on difficulty performing the activities related to balance  more so than pain  09/18/23: ABC Scale: 470 / 1600 = 29.4 %  Modified Oswestry: 26 / 50 = 52.0 % - Patient reports her answers are based on difficulty performing the activities related to balance more so than pain  SCREENING FOR RED FLAGS: Bowel or bladder incontinence: Yes: chronic bladder issues, better bowel control since surgery Spinal tumors: No Cauda equina syndrome: No Compression fracture: No Abdominal aneurysm: No  COGNITION:  Overall cognitive status: Within functional limits for tasks assessed    SENSATION: Light touch: Impaired - top of R foot Proprioception: Impaired    MUSCLE LENGTH: Hamstrings: mild/mod tight L>R ITB: mild tight B Piriformis: mod tight L>R Hip IR: mod tight B Hip flexors: WFL Quads: mild tight R>L Heelcord: mild/mod tight B  POSTURE:  rounded shoulders, forward head, right pelvic obliquity, and scoliosis  LUMBAR ROM: *ROM assessment limited in standing due to balance impairments  Active  Eval *  Flexion Hands to mid shin  Extension 50% limited  Right lateral flexion Hand to mid thigh  Left lateral flexion Hand to mid thigh  Right rotation 50% limited  Left rotation 50% limited  (Blank rows = not tested)  LOWER EXTREMITY ROM:    Grossly WFL other than limitations indicated above due to muscle tightness  LOWER EXTREMITY MMT:    MMT Right eval Left eval R 09/16/23 L 09/16/23  Hip flexion 3 3 4- 4-  Hip extension 4 4 4+ 4+  Hip abduction 4- 4- 4- 4  Hip adduction 3+ 3+ 3+ 3+  Hip internal rotation 4- 3+ 4+ 4  Hip external rotation 3 3 4- 3+  Knee flexion 5 5 5 5   Knee extension 4+ 4+ 4+ 4+  Ankle dorsiflexion 3 3- 3+ 3-  Ankle plantarflexion 2 2 2+ 2+  Ankle inversion 3 3- 3+ 3-  Ankle eversion 4- 3+ 4 4-   (Blank rows = not tested)  FUNCTIONAL TESTS:  5 times sit to stand: 11.50 sec Timed up and go (TUG): 14.00 sec with single hiking pole; 12.25 sec w/o AD - 08/12/23 10 meter walk test: 14.22 sec with single walking/trekking pole;  Gait speed = 2.31 ft/sec Berg Balance Scale: 31/56; < 36 high risk for falls (close to 100%) - 08/12/23 Functional gait assessment: 11/30; < 19 = high risk fall - 08/12/23   09/18/23 = 14.12 sec with single hiking pole; 14.69 sec w/o AD Gait speed = 2.32 ft/sec with single hiking pole; 2.23 ft/sec w/o AD Berg = 38/56, 37-45 significant risk for falls (>80%)  FGA = 15/30, < 19 = high risk fall   Standardized testing results as of discharge from latest PT episode (12/31/22): 5xSTS: 10.72 sec Berg: 48/56; 46-51 moderate risk for falls (>50%)  : 16.15 sec w/o AD Gait speed: 2.03 ft/sec FGA: 19/30; 19-24 = medium risk fall   GAIT: Distance walked: Clinic distances Assistive device utilized:  Single walking/trekking pole on R Level of assistance: SBA Gait pattern:  Increased sway, step through pattern, decreased stride length, trendelenburg, lateral hip instability, poor foot clearance- Right, and poor foot clearance- Left Comments: L>R foot slap   TODAY'S TREATMENT:   09/23/23  THERAPEUTIC EXERCISE: to improve flexibility, strength and mobility.  Verbal and tactile cues throughout for technique.  Rec bike - L3 x 6 min (seat #2) Attempted B heel raise with counter support but pt's knees buckling into windswept position with attempts to lift heels - demonstrated seated alternative for heel raises to attempt at home  NEUROMUSCULAR RE-EDUCATION: To improve posture, balance, proprioception, coordination, and reduce fall risk. Standing  Airex pad in corner: Static stance with wide BOS  Static stance with wide BOS & arms crossed on chest Lateral weight shift in wide BOS x 20 Ant/post church pews with normal BOS 2 x 10 Reaching for ipsilateral cones x 10 bil - keeping eyes looking fwd Reaching for contralateral cones x 10 bil - keeping eyes looking fwd Reaching for ipsilateral cones x 10 bil - eyes following target Reaching for contralateral cones x 10 bil - eyes following  target Wall bumps: Hip posterior x 10 Shoulders posterior x 10 Lateral hip x 10 bil   09/18/23 - Recert THERAPEUTIC EXERCISE: to improve flexibility, strength and mobility.  Verbal and tactile cues throughout for technique.  Rec bike - L3 x 6 min (seat #2)  THERAPEUTIC ACTIVITIES: ABC Scale: 470 / 1600 = 29.4 % Modified Oswestry: 26 / 50 = 52.0 % - Patient reports her answers are based on difficulty performing the activities related to balance more so than pain Berg = 38/56, 37-45 significant risk for falls (>80%)  = 14.12 sec with single hiking pole; 14.69 sec w/o AD Gait speed = 2.32 ft/sec with single hiking pole; 2.23 ft/sec w/o AD FGA = 15/30, < 19 = high risk fall  Goal assessment    09/15/23 THERAPEUTIC EXERCISE: to improve flexibility, strength and mobility.  Verbal and tactile cues throughout for technique.  Rec bike - L3 x 6 min (seat #2)  NEUROMUSCULAR RE-EDUCATION: To improve posture, balance, proprioception, coordination, reduce fall risk, and amplitude of movement. PWR! Sit to stand 2 x 10 with emphasis on PWR! Up up achieving upright stance - back of chair in front of patient for intermittent UE support as needed Church pews focusing on control of ant/post weight shift x 20 - standing in front of mat table with back of chair in front of patient for intermittent UE support as needed Wall bumps: Hip posterior x 10 Shoulders posterior x 5 Lateral hip x 5-10 bil  THERAPEUTIC ACTIVITIES: LE MMT   PATIENT EDUCATION:  Education details: continue with current HEP Person educated: Patient Education method: Explanation Education comprehension: verbalized understanding  HOME EXERCISE PROGRAM: Access Code: [email or web address] URL: [email or web address] Date: 08/26/2023 Prepared by: Elijah Hidden  Exercises - Standing Gastroc Stretch at Counter  - 1-2 x daily - 7 x weekly - 3 reps - 30 sec hold - Standing Gastroc Stretch on Foam 1/2 Roll  - 1-2 x daily - 7 x  weekly - 3 reps - 30 sec hold - Standing Hip Flexor Stretch  - 1-2 x daily - 7 x weekly - 3 reps - 30 sec hold - Hip Flexor Stretch with Strap on Table  - 1-2 x daily - 7 x weekly - 3 reps - 30 sec hold - Hooklying Hamstring Stretch with Strap  - 1-2 x daily - 7 x weekly - 3 reps - 30 sec hold - Supine Iliotibial Band Stretch with Strap  - 1-2 x daily - 7 x weekly - 3 reps - 30 sec hold - Seated Table Piriformis Stretch  - 1-2 x daily - 7 x weekly - 3 reps - 30 sec hold - Standing 3-way Hip with Walker  - 1 x daily - 3 x weekly - 2 sets - 10 reps - 3 sec hold - Hip Hiking on Step  - 1 x daily - 3 x weekly - 2 sets - 10 reps - 3 sec hold - Clam with Resistance  - 1 x daily - 3 x  weekly - 2 sets - 10 reps - 3-5 sec hold - Seated March with Resistance  - 1 x daily - 3 x weekly - 2 sets - 10 reps - 3 sec hold - Corner Balance Feet Together: Eyes Open With Head Turns  - 1 x daily - 7 x weekly - 2 sets - 5 reps  Standing PWR! Moves   HEP from most recent PT episode: Access Code: [email or web address] URL: [email or web address] Date: 11/28/2022 Prepared by: Elijah Hidden   Exercises - Seated Heel Toe Raises  - 1 x daily - 7 x weekly - 2 sets - 10 reps - 3 sec hold - Standing Isometric Hip Abduction with Ball on Wall  - 1 x daily - 7 x weekly - 2 sets - 10 reps - 3 sec hold - Seated Transversus Abdominis Bracing  - 2 x daily - 7 x weekly - 2 sets - 10 reps - 3-5 sec hold - Seated Isometric Hip Abduction with Resistance  - 1 x daily - 3 x weekly - 2 sets - 10 reps - 3 sec hold - Seated March with Resistance  - 1 x daily - 3 x weekly - 2 sets - 10 reps - 3 sec hold - Seated Shoulder Row with Anchored Resistance  - 1 x daily - 3 x weekly - 2 sets - 10 reps - 3-5 hold hold - Seated Single Arm Shoulder Row with Anchored Resistance  - 1 x daily - 3 x weekly - 2 sets - 10 reps - 3 sec hold - Seated Anti-Rotation Press With Anchored Resistance  - 1 x daily - 3 x weekly - 2 sets - 10 reps - 3 sec hold -  Tall Kneeling Hip Hinge  - 1 x daily - 3-4 x weekly - 2 sets - 10 reps - 3 sec hold   Quadruped PWR! Up & Step   ASSESSMENT:  CLINICAL IMPRESSION: Makayli reports she has been trying to rely less on external support with standing activities and feels like she is getting steadier.  Neuromuscular reeducation interventions continuing to focus on static balance control without external support while encouraging hip and ankle strategies in place of stepping strategies for balance correction.  Added compliant surfaces using corner balance set-up for safety with patient demonstrating significant difficulty just achieving good static control and extremely challenged with weight shifting and reaching activities, especially when static visual stabilization taking away by having patient look in direction of movements.  Sahasra requested a green TB for her HEP for ankle PF, therefore attempted to try standing B heel raises however her knees and hips collapsing into windswept flexion to the R with attempt.  Discouraged attempting standing heel raises at home and suggested she try the seated version if she wants to work on heel raises at home.  Kacia remains motivated to further improve her strength, balance and quality of mobility, and will benefit from continued skilled PT to address ongoing weakness and balance deficits to improve mobility and activity tolerance with improved stability and balance to reduce fall risk.    OBJECTIVE IMPAIRMENTS: Abnormal gait, decreased activity tolerance, decreased balance, decreased coordination, decreased endurance, decreased mobility, difficulty walking, decreased ROM, decreased strength, decreased safety awareness, increased fascial restrictions, impaired perceived functional ability, increased muscle spasms, impaired flexibility, impaired sensation, impaired tone, improper body mechanics, and postural dysfunction.   ACTIVITY LIMITATIONS: carrying, lifting, bending, sitting,  standing, squatting, stairs, transfers, bed mobility, bathing, toileting, dressing, reach over head, hygiene/grooming, locomotion level, and  caring for others  PARTICIPATION LIMITATIONS: meal prep, cleaning, laundry, driving, shopping, community activity, yard work, and church  PERSONAL FACTORS: Age, Past/current experiences, Time since onset of injury/illness/exacerbation, and 3+ comorbidities: L2-3 XLIF/PSF 05/24/22; C5-7 ACDF 09/21/20; idiopathic progressive neuropathy; hypthyroidism; DDD; remote h/o back surgery at age 27; h/o scoliosis; Takotsubo cardiomyopathy 01/10/22  are also affecting patient's functional outcome.   REHAB POTENTIAL: Good  CLINICAL DECISION MAKING: Evolving/moderate complexity  EVALUATION COMPLEXITY: Moderate   GOALS: Goals reviewed with patient? Yes  SHORT TERM GOALS: Target date: 08/26/2023  Patient will be independent with initial HEP to improve outcomes and carryover.  Baseline:  Goal status: MET - 08/21/23  2.  Complete balance assessment and update LTG's as indicated.  Baseline:  Goal status: MET - 08/12/23   LONG TERM GOALS: Target date: 09/23/2023, extended to 11/13/2023  Patient will be independent with ongoing/advanced HEP for self-management at home.  Baseline:  Goal status: PARTIALLY MET - 09/11/23 - Met for current HEP; further updates anticipated  2.  Patient to demonstrate ability to achieve and maintain good spinal alignment/posturing and body mechanics needed for daily activities. Baseline:  Goal status: IN PROGRESS - 09/11/23 - Pt aware of good posture and body mechanics but limited at times by impaired balance  3.  Patient will demonstrate functional pain free lumbar ROM to perform ADLs.   Baseline: Refer to above lumbar ROM table Goal status: IN PROGRESS   4.  Patient will demonstrate improved B proximal LE strength to >/= 4 to 4+/5 for improved stability and ease of mobility . Baseline: Refer to above LE MMT table Goal status: IN PROGRESS -  09/16/23 - gains noted in overall B hip strength  5.  Patient will demonstrate improved B ankle strength to >/= 3+ to 4-/5 for improved gait stability with decreased foot slap.  Baseline: Refer to above LE MMT table Goal status: IN PROGRESS - 09/16/23 - majority of ankle motions with at least 1 muscle grade improvement in strength, excluding L ankle DF and inversion  6.  Patient will report >/= 60% on ABC scale to demonstrate improved balance confidence.  Baseline: 710 / 1600 = 44.4 % Goal status: IN PROGRESS - 09/18/23 - 470 / 1600 = 29.4 %  7. Patient will report </= 36% on Modified Oswestry to demonstrate improved functional ability with decreased pain interference. Baseline: 24 / 50 = 48.0 % Goal status: IN PROGRESS - 09/18/23 - 26 / 50 = 52.0 %  8.  Patient will improve gait velocity to at least 2.62 ft/sec for improved gait efficiency and safety with community ambulation. Baseline: 2.31 ft/sec with single walking/trekking pole on R Goal status: IN PROGRESS - 09/18/23 - 2.32 ft/sec with single hiking pole; 2.23 ft/sec w/o AD  9.  Patient will improve Berg score to >/= 39/56 (8 points) to improve safety and stability with ADLs in standing and reduce risk for falls.  Baseline: 31/56 Goal status: IN PROGRESS - 09/18/23 - 38/56  10.  Patient will improve FGA score to >/= 15/30 (4 points) to improve gait stability and reduce risk for falls.  Baseline: 11/30 Goal status: MET & REVISED (see 10a. Below)- 09/18/23 - 15/30  10a.  Patient will improve FGA score to >/= 19/30 (baseline as of prior D/C from PT) to improve gait stability and reduce risk for falls.  Baseline: 11/30 on eval, 15/30 as of 09/18/23 Goal status: REVISED      PLAN:  PT FREQUENCY: 2x/week  PT DURATION: 8 weeks  PLANNED INTERVENTIONS: 97164- PT Re-evaluation, 97110-Therapeutic exercises, 97530- Therapeutic activity, [id number]- Neuromuscular re-education, 97535- Self Care, [id number]- Manual therapy, [id number]- Gait training, 97014- Electrical  stimulation (unattended), [phone]- Electrical stimulation (manual), [phone]- Ionotophoresis 4mg /ml Dexamethasone, Patient/Family education, Balance training, Stair training, Taping, Dry Needling, Joint mobilization, DME instructions, Cryotherapy, and Moist heat  PLAN FOR NEXT SESSION: primary focus on balance training and gait stability; progress lumbopelvic/LE strengthening and core stabilization; review & HEP PRN   Elijah CHRISTELLA Hidden, PT 09/23/2023, 4:16 PM

## 2023-09-23 NOTE — Telephone Encounter (Signed)
 Pt c/o Shortness Of Breath: STAT if SOB developed within the last 24 hours or pt is noticeably SOB on the phone  1. Are you currently SOB (can you hear that pt is SOB on the phone)? No  2. How long have you been experiencing SOB? X 2-3 weeks  3. Are you SOB when sitting or when up moving around? Up moving around   4. Are you currently experiencing any other symptoms? Fatigue

## 2023-09-25 ENCOUNTER — Ambulatory Visit: Payer: Medicare PPO | Admitting: Physical Therapy

## 2023-09-25 ENCOUNTER — Encounter: Payer: Self-pay | Admitting: Physical Therapy

## 2023-09-25 DIAGNOSIS — M21372 Foot drop, left foot: Secondary | ICD-10-CM

## 2023-09-25 DIAGNOSIS — R2681 Unsteadiness on feet: Secondary | ICD-10-CM

## 2023-09-25 DIAGNOSIS — M6281 Muscle weakness (generalized): Secondary | ICD-10-CM

## 2023-09-25 DIAGNOSIS — M21371 Foot drop, right foot: Secondary | ICD-10-CM

## 2023-09-25 DIAGNOSIS — R2689 Other abnormalities of gait and mobility: Secondary | ICD-10-CM

## 2023-09-25 NOTE — Therapy (Signed)
OUTPATIENT PHYSICAL THERAPY TREATMENT     Patient Name: [name] Trujillo MRN: [id number] DOB:Apr 02, 1941, 83 y.o., female Today's Date: 09/25/2023  END OF SESSION:  PT End of Session - 09/25/23 1154     Visit Number 12    Date for PT Re-Evaluation 11/13/23    Authorization Type [employer]    Authorization Time Period 09/11/23 - 10/23/23    Authorization - Visit Number 5    Authorization - Number of Visits 8    Progress Note Due on Visit 20    PT Start Time 1154    PT Stop Time 1234    PT Time Calculation (min) 40 min    Activity Tolerance Patient tolerated treatment well    Behavior During Therapy [facility] for tasks assessed/performed                      Past Medical History:  Diagnosis Date   High cholesterol    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   She had ACUTE TAKOTSUBO (STRESS) CARDIOMYOPATHY with elevated Troponin Levels - this would be considered "Demand Ischemia - Demand Infarction" & NOT associated with ACS/CAD.     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  - Cath with NO CAD. ==> RESOLVED: f/u Echo 03/2022: EF 60-65%.   Past Surgical History:  Procedure Laterality Date   APPENDECTOMY     75-18 yo   BACK SURGERY     Age 81   CESAREAN SECTION     ECTOPIC PREGNANCY SURGERY     LAPAROSCOPIC HYSTERECTOMY     LEFT HEART CATH AND CORONARY ANGIOGRAPHY N/A 01/09/2022   Procedure: LEFT HEART CATH AND CORONARY ANGIOGRAPHY;  Surgeon: Tonny Bollman, MD;  Location: [facility] INVASIVE CV LAB;  Service: CV:: Widely patent coronaries with mild nonobstructive LAD plaquing.  Right dominant system.  Normal LVEDP.  Based on clinical presentation, findings are consistent with  acute Takotsubo Cardiomyopathy Syndrome   POSTERIOR FUSION LUMBAR SPINE  05/24/2022   [facility], [place]; [name], MD): L2-L3 XLIF, L2-L3 POSTERIOR DECOMPRESSION AND FUSION   TRANSTHORACIC ECHOCARDIOGRAM  01/08/2022   Severely decreased LV function-EF 25 to 30%.  Mid to apical (mostly anterior) with normal basal motion.  GR 2 DD-elevated LAP.  Mildly dilated LA.  Aortic sclerosis with no stenosis.  No AI.  Normal MV with mild to moderate TR.  Mildly elevated RAP, and PAP (estimated 49 mg). If LAD CAD ruled out - consistent with Takotsubo CM Syndrome.   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    PCP: Mosetta Putt, MD   REFERRING PROVIDER: Rondel Oh, NP   REFERRING DIAG: Z98.1 (ICD-10-CM) - S/p lumbar spinal fusion  Eval and  treat for: s/p lumbar fusion, lower extremity strengthening  THERAPY DIAG:  Muscle weakness (generalized)  Other abnormalities of gait and mobility  Unsteadiness on feet  Foot drop, left  Foot drop, right  RATIONALE FOR EVALUATION AND TREATMENT: Rehabilitation  ONSET DATE: 05/24/2022 - L2-L3 XLIF/PSF   NEXT MD VISIT: 12/31/2023 with Marlaine Hind, MD (Surgeon)   SUBJECTIVE:                                                                                                                                                                                                         SUBJECTIVE STATEMENT: Pt feels the the GTB  provided for ankle PF last visit is working well and she feels like her ankle control is improving when she walks.  PAIN: Are you having pain? No  PERTINENT HISTORY:  L2-3 XLIF/PSF 05/24/22; C5-7 ACDF 09/21/20; idiopathic progressive neuropathy; hypthyroidism; DDD; remote h/o back surgery at age 67; h/o scoliosis; Takotsubo cardiomyopathy 01/10/22   PRECAUTIONS: Fall  RED FLAGS: Bowel or bladder incontinence: Yes: chronic bladder issues, better bowel control since surgery  WEIGHT BEARING RESTRICTIONS: No  FALLS:  Has patient fallen in last 6 months? No  LIVING ENVIRONMENT: Lives with: lives with their spouse Lives in: House/apartment Stairs: Yes: External: 5-6 steps; on left going up Has following equipment at home: Environmental consultant - 2 wheeled, shower chair, and hiking/walking poles  OCCUPATION: Retired Runner, broadcasting/film/video   PLOF: Independent and Leisure: walking ~30 minutes 1x/day (3x around the court near where she lives); sewing/quilting; likes working in the yard but has not been able to do so due to her balance      PATIENT GOALS: "Improved balance (to feel more comfortable in a crowd and walk more normally)."   OBJECTIVE: (objective measures completed at initial evaluation unless otherwise dated)  DIAGNOSTIC FINDINGS:  06/30/23 - XR Lumbar spine: Stable postoperative changes of the lumbar spine with evidence of spinal fusion at L2-L3.   07/01/22 - Lumbar spine x-ray: Expected postoperative appearance related to L2-L3 fixation.    06/12/21 - Lumbar MRI: Postoperative and degenerative changes of the lumbar spine as described, similar compared to July 28, 2020. At L2-L3, there is moderate spinal canal stenosis due to disc bulging, endplate osteophytic spurring, facet and ligamentum flavum hypertrophy. Milder spinal canal stenosis is seen at L3-L4. There is multilevel neural foraminal stenosis.   PATIENT SURVEYS:  ABC scale 710 / 1600 = 44.4 % Modified Oswestry 24 / 50 = 48.0 %  - Patient  reports her answers are based on difficulty performing the activities related to  balance more so than pain  09/18/23: ABC Scale: 470 / 1600 = 29.4 % Modified Oswestry: 26 / 50 = 52.0 % - Patient reports her answers are based on difficulty performing the activities related to balance more so than pain  SCREENING FOR RED FLAGS: Bowel or bladder incontinence: Yes: chronic bladder issues, better bowel control since surgery Spinal tumors: No Cauda equina syndrome: No Compression fracture: No Abdominal aneurysm: No  COGNITION:  Overall cognitive status: Within functional limits for tasks assessed    SENSATION: Light touch: Impaired - top of R foot Proprioception: Impaired    MUSCLE LENGTH: Hamstrings: mild/mod tight L>R ITB: mild tight B Piriformis: mod tight L>R Hip IR: mod tight B Hip flexors: WFL Quads: mild tight R>L Heelcord: mild/mod tight B  POSTURE:  rounded shoulders, forward head, right pelvic obliquity, and scoliosis  LUMBAR ROM: *ROM assessment limited in standing due to balance impairments  Active  Eval *  Flexion Hands to mid shin  Extension 50% limited  Right lateral flexion Hand to mid thigh  Left lateral flexion Hand to mid thigh  Right rotation 50% limited  Left rotation 50% limited  (Blank rows = not tested)  LOWER EXTREMITY ROM:    Grossly WFL other than limitations indicated above due to muscle tightness  LOWER EXTREMITY MMT:    MMT Right eval Left eval R 09/16/23 L 09/16/23  Hip flexion 3 3 4- 4-  Hip extension 4 4 4+ 4+  Hip abduction 4- 4- 4- 4  Hip adduction 3+ 3+ 3+ 3+  Hip internal rotation 4- 3+ 4+ 4  Hip external rotation 3 3 4- 3+  Knee flexion 5 5 5 5   Knee extension 4+ 4+ 4+ 4+  Ankle dorsiflexion 3 3- 3+ 3-  Ankle plantarflexion 2 2 2+ 2+  Ankle inversion 3 3- 3+ 3-  Ankle eversion 4- 3+ 4 4-   (Blank rows = not tested)  FUNCTIONAL TESTS:  5 times sit to stand: 11.50 sec Timed up and go (TUG): 14.00 sec with single hiking pole;  12.25 sec w/o AD - 08/12/23 10 meter walk test: 14.22 sec with single walking/trekking pole; Gait speed = 2.31 ft/sec Berg Balance Scale: 31/56; < 36 high risk for falls (close to 100%) - 08/12/23 Functional gait assessment: 11/30; < 19 = high risk fall - 08/12/23   09/18/23 = 14.12 sec with single hiking pole; 14.69 sec w/o AD Gait speed = 2.32 ft/sec with single hiking pole; 2.23 ft/sec w/o AD Berg = 38/56, 37-45 significant risk for falls (>80%)  FGA = 15/30, < 19 = high risk fall   Standardized testing results as of discharge from latest PT episode (12/31/22): 5xSTS: 10.72 sec Berg: 48/56; 46-51 moderate risk for falls (>50%)  : 16.15 sec w/o AD Gait speed: 2.03 ft/sec FGA: 19/30; 19-24 = medium risk fall   GAIT: Distance walked: Clinic distances Assistive device utilized:  Single walking/trekking pole on R Level of assistance: SBA Gait pattern:  Increased sway, step through pattern, decreased stride length, trendelenburg, lateral hip instability, poor foot clearance- Right, and poor foot clearance- Left Comments: L>R foot slap   TODAY'S TREATMENT:   09/25/23  THERAPEUTIC EXERCISE: to improve flexibility, strength and mobility.  Verbal and tactile cues throughout for technique.  Rec bike - L3 x 6 min (seat #2) Seated heel raises 2 x 10 - attempted overpressure with fwd trunk lean with elbows resting on thighs for added resistance on 2nd set but pt with decreased ability  to lift heels Attempted standing toe raises with back against wall but pt unable to achieve any lift of toes Seated toe raises x 10 BATCA leg press 15# x 10 BATCA ankle press 5# 2 x 5 - PT assist to keep L foot from sliding off plate  NEUROMUSCULAR RE-EDUCATION: To improve posture, balance, proprioception, coordination, reduce fall risk, and amplitude of movement. Cone knock down and righting x 6 with side-step btw cones - CGA of PT but most LOB self-corrected Standing PWR! Moves (set up with back of  chair in front of patient while patient standing in front of mat table for safety, SBA/CGA of PT for all patterns): Up 2 x 10 - coordinating UE reach to counterbalance squat progressing into shoulder extension for full upright posture upon return to stand - pt still experiencing some repeated posterior LOB requiring a few steps to correct LOB on initial attempts but no unplanned sit down on mat table as on prior attempts, with patient progressively better able to regulate eccentric to concentric transition Rock x 10 alternating sides - trailing hand remaining on back of chair for balance for most repetitions with emphasis on heel raise/toe off for trailing LE Step x 10 B alternating sides - trailing hand remaining on back of chair for balance   09/23/23  THERAPEUTIC EXERCISE: to improve flexibility, strength and mobility.  Verbal and tactile cues throughout for technique.  Rec bike - L3 x 6 min (seat #2) Attempted B heel raise with counter support but pt's knees buckling into windswept position with attempts to lift heels - demonstrated seated alternative for heel raises to attempt at home  NEUROMUSCULAR RE-EDUCATION: To improve posture, balance, proprioception, coordination, and reduce fall risk. Standing Airex pad in corner: Static stance with wide BOS  Static stance with wide BOS & arms crossed on chest Lateral weight shift in wide BOS x 20 Ant/post "church pews" with normal BOS 2 x 10 Reaching for ipsilateral cones x 10 bil - keeping eyes looking fwd Reaching for contralateral cones x 10 bil - keeping eyes looking fwd Reaching for ipsilateral cones x 10 bil - eyes following target Reaching for contralateral cones x 10 bil - eyes following target Wall bumps: Hip posterior x 10 Shoulders posterior x 10 Lateral hip x 10 bil   09/18/23 - Recert THERAPEUTIC EXERCISE: to improve flexibility, strength and mobility.  Verbal and tactile cues throughout for technique.  Rec bike - L3 x 6 min (seat  #2)  THERAPEUTIC ACTIVITIES: ABC Scale: 470 / 1600 = 29.4 % Modified Oswestry: 26 / 50 = 52.0 % - Patient reports her answers are based on difficulty performing the activities related to balance more so than pain Berg = 38/56, 37-45 significant risk for falls (>80%)  = 14.12 sec with single hiking pole; 14.69 sec w/o AD Gait speed = 2.32 ft/sec with single hiking pole; 2.23 ft/sec w/o AD FGA = 15/30, < 19 = high risk fall  Goal assessment    PATIENT EDUCATION:  Education details: continue with current HEP Person educated: Patient Education method: Explanation Education comprehension: verbalized understanding  HOME EXERCISE PROGRAM: Access Code: [email or web address] URL: [email or web address] Date: 08/26/2023 Prepared by: Glenetta Hew  Exercises - Standing Gastroc Stretch at Counter  - 1-2 x daily - 7 x weekly - 3 reps - 30 sec hold - Standing Gastroc Stretch on Foam 1/2 Roll  - 1-2 x daily - 7 x weekly - 3 reps - 30 sec hold - Standing Hip Flexor Stretch  -  1-2 x daily - 7 x weekly - 3 reps - 30 sec hold - Hip Flexor Stretch with Strap on Table  - 1-2 x daily - 7 x weekly - 3 reps - 30 sec hold - Hooklying Hamstring Stretch with Strap  - 1-2 x daily - 7 x weekly - 3 reps - 30 sec hold - Supine Iliotibial Band Stretch with Strap  - 1-2 x daily - 7 x weekly - 3 reps - 30 sec hold - Seated Table Piriformis Stretch  - 1-2 x daily - 7 x weekly - 3 reps - 30 sec hold - Standing 3-way Hip with Walker  - 1 x daily - 3 x weekly - 2 sets - 10 reps - 3 sec hold - Hip Hiking on Step  - 1 x daily - 3 x weekly - 2 sets - 10 reps - 3 sec hold - Clam with Resistance  - 1 x daily - 3 x weekly - 2 sets - 10 reps - 3-5 sec hold - Seated March with Resistance  - 1 x daily - 3 x weekly - 2 sets - 10 reps - 3 sec hold - Corner Balance Feet Together: Eyes Open With Head Turns  - 1 x daily - 7 x weekly - 2 sets - 5 reps  Standing PWR! Moves   HEP from most recent PT episode: Access Code:  [email or web address] URL: [email or web address] Date: 11/28/2022 Prepared by: Glenetta Hew   Exercises - Seated Heel Toe Raises  - 1 x daily - 7 x weekly - 2 sets - 10 reps - 3 sec hold - Standing Isometric Hip Abduction with Ball on Wall  - 1 x daily - 7 x weekly - 2 sets - 10 reps - 3 sec hold - Seated Transversus Abdominis Bracing  - 2 x daily - 7 x weekly - 2 sets - 10 reps - 3-5 sec hold - Seated Isometric Hip Abduction with Resistance  - 1 x daily - 3 x weekly - 2 sets - 10 reps - 3 sec hold - Seated March with Resistance  - 1 x daily - 3 x weekly - 2 sets - 10 reps - 3 sec hold - Seated Shoulder Row with Anchored Resistance  - 1 x daily - 3 x weekly - 2 sets - 10 reps - 3-5 hold hold - Seated Single Arm Shoulder Row with Anchored Resistance  - 1 x daily - 3 x weekly - 2 sets - 10 reps - 3 sec hold - Seated Anti-Rotation Press With Anchored Resistance  - 1 x daily - 3 x weekly - 2 sets - 10 reps - 3 sec hold - Tall Kneeling Hip Hinge  - 1 x daily - 3-4 x weekly - 2 sets - 10 reps - 3 sec hold   Quadruped PWR! Up & Step   ASSESSMENT:  CLINICAL IMPRESSION: Issa requesting to revisit heel raises from last visit, therefore provided clarification or isolated heel and toe lifts as well as options for increased resistance.  Attempted machine strengthening for ankles, but pt requiring assistance of PT to keep foot from slipping off plate.  Remainder of session continuing to work on balance and gradated muscle control during transitional and weight shifting motions to facilitate increased reliance on hip and ankle balance strategies in addition to stepping strategies to prevent LOB.  Shakea remains motivated to further improve her strength, balance and quality of mobility, and will benefit from continued skilled PT to address ongoing  weakness and balance deficits to improve mobility and activity tolerance with improved stability and balance to reduce fall risk.    OBJECTIVE IMPAIRMENTS:  Abnormal gait, decreased activity tolerance, decreased balance, decreased coordination, decreased endurance, decreased mobility, difficulty walking, decreased ROM, decreased strength, decreased safety awareness, increased fascial restrictions, impaired perceived functional ability, increased muscle spasms, impaired flexibility, impaired sensation, impaired tone, improper body mechanics, and postural dysfunction.   ACTIVITY LIMITATIONS: carrying, lifting, bending, sitting, standing, squatting, stairs, transfers, bed mobility, bathing, toileting, dressing, reach over head, hygiene/grooming, locomotion level, and caring for others  PARTICIPATION LIMITATIONS: meal prep, cleaning, laundry, driving, shopping, community activity, yard work, and church  PERSONAL FACTORS: Age, Past/current experiences, Time since onset of injury/illness/exacerbation, and 3+ comorbidities: L2-3 XLIF/PSF 05/24/22; C5-7 ACDF 09/21/20; idiopathic progressive neuropathy; hypthyroidism; DDD; remote h/o back surgery at age 80; h/o scoliosis; Takotsubo cardiomyopathy 01/10/22  are also affecting patient's functional outcome.   REHAB POTENTIAL: Good  CLINICAL DECISION MAKING: Evolving/moderate complexity  EVALUATION COMPLEXITY: Moderate   GOALS: Goals reviewed with patient? Yes  SHORT TERM GOALS: Target date: 08/26/2023  Patient will be independent with initial HEP to improve outcomes and carryover.  Baseline:  Goal status: MET - 08/21/23  2.  Complete balance assessment and update LTG's as indicated.  Baseline:  Goal status: MET - 08/12/23   LONG TERM GOALS: Target date: 09/23/2023, extended to 11/13/2023  Patient will be independent with ongoing/advanced HEP for self-management at home.  Baseline:  Goal status: PARTIALLY MET - 09/11/23 - Met for current HEP; further updates anticipated  2.  Patient to demonstrate ability to achieve and maintain good spinal alignment/posturing and body mechanics needed for daily  activities. Baseline:  Goal status: IN PROGRESS - 09/11/23 - Pt aware of good posture and body mechanics but limited at times by impaired balance  3.  Patient will demonstrate functional pain free lumbar ROM to perform ADLs.   Baseline: Refer to above lumbar ROM table Goal status: IN PROGRESS   4.  Patient will demonstrate improved B proximal LE strength to >/= 4 to 4+/5 for improved stability and ease of mobility . Baseline: Refer to above LE MMT table Goal status: IN PROGRESS - 09/16/23 - gains noted in overall B hip strength  5.  Patient will demonstrate improved B ankle strength to >/= 3+ to 4-/5 for improved gait stability with decreased foot slap.  Baseline: Refer to above LE MMT table Goal status: IN PROGRESS - 09/16/23 - majority of ankle motions with at least 1 muscle grade improvement in strength, excluding L ankle DF and inversion  6.  Patient will report >/= 60% on ABC scale to demonstrate improved balance confidence.  Baseline: 710 / 1600 = 44.4 % Goal status: IN PROGRESS - 09/18/23 - 470 / 1600 = 29.4 %  7. Patient will report </= 36% on Modified Oswestry to demonstrate improved functional ability with decreased pain interference. Baseline: 24 / 50 = 48.0 % Goal status: IN PROGRESS - 09/18/23 - 26 / 50 = 52.0 %  8.  Patient will improve gait velocity to at least 2.62 ft/sec for improved gait efficiency and safety with community ambulation. Baseline: 2.31 ft/sec with single walking/trekking pole on R Goal status: IN PROGRESS - 09/18/23 - 2.32 ft/sec with single hiking pole; 2.23 ft/sec w/o AD  9.  Patient will improve Berg score to >/= 39/56 (8 points) to improve safety and stability with ADLs in standing and reduce risk for falls.  Baseline: 31/56 Goal status: IN PROGRESS -  09/18/23 - 38/56  10.  Patient will improve FGA score to >/= 15/30 (4 points) to improve gait stability and reduce risk for falls.  Baseline: 11/30 Goal status: MET & REVISED (see 10a. Below)- 09/18/23 -  15/30  10a.  Patient will improve FGA score to >/= 19/30 (baseline as of prior D/C from PT) to improve gait stability and reduce risk for falls.  Baseline: 11/30 on eval, 15/30 as of 09/18/23 Goal status: REVISED      PLAN:  PT FREQUENCY: 2x/week  PT DURATION: 8 weeks  PLANNED INTERVENTIONS: 97164- PT Re-evaluation, 97110-Therapeutic exercises, 97530- Therapeutic activity, 97112- Neuromuscular re-education, 97535- Self Care, 13086- Manual therapy, 97116- Gait training, 97014- Electrical stimulation (unattended), [phone]- Electrical stimulation (manual), [phone]- Ionotophoresis 4mg /ml Dexamethasone, Patient/Family education, Balance training, Stair training, Taping, Dry Needling, Joint mobilization, DME instructions, Cryotherapy, and Moist heat  PLAN FOR NEXT SESSION: primary focus on balance training and gait stability; progress lumbopelvic/LE strengthening and core stabilization; review & HEP PRN   Marry Guan, PT 09/25/2023, 11:59 AM

## 2023-09-30 ENCOUNTER — Encounter: Payer: Self-pay | Admitting: Physical Therapy

## 2023-09-30 ENCOUNTER — Ambulatory Visit: Payer: Medicare PPO | Admitting: Physical Therapy

## 2023-09-30 DIAGNOSIS — M6281 Muscle weakness (generalized): Secondary | ICD-10-CM | POA: Diagnosis not present

## 2023-09-30 DIAGNOSIS — R2689 Other abnormalities of gait and mobility: Secondary | ICD-10-CM

## 2023-09-30 DIAGNOSIS — M21372 Foot drop, left foot: Secondary | ICD-10-CM

## 2023-09-30 DIAGNOSIS — R2681 Unsteadiness on feet: Secondary | ICD-10-CM

## 2023-09-30 DIAGNOSIS — M21371 Foot drop, right foot: Secondary | ICD-10-CM

## 2023-09-30 NOTE — Progress Notes (Signed)
Cardiology Clinic Note   Patient Name: Brittanny Levenhagen Date of Encounter: 10/03/2023  Primary Care Provider:  Mosetta Putt, MD Primary Cardiologist:  Bryan Lemma, MD  Patient Profile    Solei Wubben 83 year old female presents to the clinic today for follow-up evaluation of her Takotsubo cardiomyopathy, hyperlipidemia, and fatigue.  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   She had ACUTE TAKOTSUBO (STRESS) CARDIOMYOPATHY with elevated Troponin Levels - this would be considered "Demand Ischemia - Demand Infarction" & NOT associated with ACS/CAD.     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  - Cath with NO CAD. ==> RESOLVED: f/u Echo 03/2022: EF 60-65%.   Past Surgical History:  Procedure Laterality Date   APPENDECTOMY     20-18 yo   BACK SURGERY     Age 32   CESAREAN SECTION     ECTOPIC PREGNANCY SURGERY     LAPAROSCOPIC HYSTERECTOMY     LEFT HEART CATH AND CORONARY ANGIOGRAPHY N/A 01/09/2022   Procedure: LEFT HEART CATH AND CORONARY ANGIOGRAPHY;  Surgeon: Tonny Bollman, MD;  Location: [facility] INVASIVE CV LAB;  Service: CV:: Widely patent coronaries with mild nonobstructive LAD plaquing.  Right dominant system.  Normal LVEDP.  Based on clinical presentation, findings are consistent with acute Takotsubo Cardiomyopathy Syndrome   POSTERIOR FUSION LUMBAR SPINE  05/24/2022   [facility], [place]; [name], MD): L2-L3 XLIF, L2-L3 POSTERIOR DECOMPRESSION AND FUSION   TRANSTHORACIC ECHOCARDIOGRAM  01/08/2022   Severely decreased LV function-EF 25 to 30%.  Mid to apical (mostly  anterior) with normal basal motion.  GR 2 DD-elevated LAP.  Mildly dilated LA.  Aortic sclerosis with no stenosis.  No AI.  Normal MV with mild to moderate TR.  Mildly elevated RAP, and PAP (estimated 49 mg). If LAD CAD ruled out - consistent with Takotsubo CM Syndrome.   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  She was admitted 5/23 with Takotsubo cardiomyopathy.  Her troponins were elevated and she was diagnosed with type II MI.  Her event occurred after watching her husband fall in the driveway.  This was very concerning to her.  She developed chest tightness and radiating to her jaw.  She also had left arm and shoulder discomfort.  Her echocardiogram at that time showed an EF of 25-30%.  She was started on metoprolol, losartan, and spironolactone.  Follow-up echocardiogram 8/23 showed an EF of 60 to 65%, mild MR, and her metoprolol was decreased to 12.5 mg daily.  She followed up with Dr. Herbie Baltimore 01/20/2023.  During that time she was doing  well.  She was walking with a cane for balance.  She was using a wheelchair for long distance travel.  She was trying to stay as active as possible.  She was still working with physical therapy.  She was asymptomatic from a cardiac standpoint.  She noted that when she would walk during the day with her cane she was not noticing shortness of breath or chest discomfort.  She felt that she was more active and less short of breath.  Follow-up was planned for 12 months.  She contacted the clinic on  09/23/2023.  She reported 2 to 3 weeks of shortness of breath.  She notices this with increased physical activity.  She also reported fatigue.  She was added to my schedule for today.  She presents to the clinic today for evaluation and states over the past few weeks she has noticed more shortness of breath.  She has also noticed some indigestion.  She also feels that she has been somewhat dizzy and feels that she occasionally needs to lay down.  We reviewed her previous cardiac history.  Her blood pressure today is well-controlled at 130/60.  Her EKG today shows normal sinus rhythm 89 bpm.  She presents with her daughter.  We used shared decision making to decide to proceed with CBC, CMP, thyroid panel, and echocardiogram.  I will have her maintain her physical activity.  She is currently doing physical therapy and walking half mile per day on graded incline..  Today she denies chest pain,  palpitations, melena, hematuria, hemoptysis, diaphoresis, weakness, presyncope, syncope, orthopnea, and PND.     Home Medications    Prior to Admission medications   Medication Sig Start Date End Date Taking? Authorizing Provider  acetaminophen (TYLENOL) 325 MG tablet Take 975 mg by mouth daily as needed for moderate pain.    [provider]  Carboxymethylcellulose Sodium (REFRESH TEARS OP) Place 2 drops into both eyes 3 (three) times daily.    [provider]  levothyroxine (SYNTHROID) 88 MCG tablet Take 88 mcg by mouth daily before breakfast.    [provider]  losartan (COZAAR) 25 MG tablet Take 1/2 tablet (12.5 mg total) by mouth daily. 04/08/23   [facility], Roe Rutherford, PA  metoprolol succinate (TOPROL XL) 25 MG 24 hr tablet Take 0.5 tablets (12.5 mg total) by mouth daily. 05/20/23   Marykay Lex, MD  rosuvastatin (CRESTOR) 10 MG tablet Take 1 tablet (10 mg total) by mouth at bedtime. Patient not taking: Reported on 07/29/2023 04/09/22   Marcelino Duster, PA  solifenacin  (VESICARE) 10 MG tablet Take 10 mg by mouth daily.    [provider]  vitamin B-12 (CYANOCOBALAMIN) 1000 MCG tablet Take 1,000 mcg by mouth daily. Patient not taking: Reported on 07/29/2023    [provider]    Family History    Family History  Problem Relation Age of Onset   Stroke Mother    Heart attack Father    Alcohol abuse Father    Neuropathy Neg Hx    She indicated that her mother is deceased. She indicated that her father is deceased. She indicated that her maternal grandmother is deceased. She indicated that her maternal grandfather is deceased. She indicated that her paternal grandmother is deceased. She indicated that her paternal grandfather is deceased. She indicated that the status of her neg hx is unknown.  Social History    Social History   Socioeconomic History   Marital status: Married  Spouse name: Not on file   Number of children: 7   Years of education: 16   Highest education level: Bachelor's degree (e.g., BA, AB, BS)  Occupational History   Occupation: Retired  Tobacco Use   Smoking status: Never   Smokeless tobacco: Never  Substance and Sexual Activity   Alcohol use: Yes    Comment: once or twice a month   Drug use: No   Sexual activity: Not on file  Other Topics Concern   Not on file  Social History Narrative   Lives at home w/ her husband    Right-handed   Caffeine: 2 cups of coffee in the morning   Social Drivers of Health   Financial Resource Strain: Low Risk  (04/08/2021)   Received from [facility] and [facility], [facility] and [facility]   Overall Financial Resource Strain (CARDIA)    Difficulty of Paying Living Expenses: Not very hard  Food Insecurity: No Food Insecurity (04/08/2021)   Received from [facility] and [facility], [facility] and [facility]   Hunger Vital Sign    Worried About Programme researcher, broadcasting/film/video in the Last Year: Never true    Ran Out of Food in the  Last Year: Never true  Transportation Needs: No Transportation Needs (04/08/2021)   Received from [facility] and [facility], [facility] and [facility]   PRAPARE - Transportation    In the past 12 months, has lack of transportation kept you from medical appointments or from getting medications?: No    In the past 12 months, has lack of transportation kept you from meetings, work, or from getting things needed for daily living?: No  Physical Activity: Sufficiently Active (04/08/2021)   Received from [facility] and [facility], [facility] and [facility]   Exercise Vital Sign    Days of Exercise per Week: 6 days    Minutes of Exercise per Session: 50 min  Stress: No Stress Concern Present (04/08/2021)   Received from [facility] and [facility], [facility] and [facility]   [employer] of Occupational Health - Occupational Stress Questionnaire    Feeling of Stress : Only a little  Social Connections: Socially Integrated (04/08/2021)   Received from [facility] and [facility], [facility] and [facility]   Social Connection and Isolation Panel [facility]    Frequency of Communication with Friends and Family: More than three times a week    Frequency of Social Gatherings with Friends and Family: Once a week    Attends Religious Services: More than 4 times per year    Active Member of [employer] or Organizations: Yes    Attends Engineer, structural: More than 4 times per year    Marital Status: Married  Catering manager Violence: Not At Risk (04/08/2021)   Received from [facility] and [facility], [facility] and [facility]   Humiliation, Afraid, Rape, and Kick questionnaire    Fear of Current or Ex-Partner: No    Emotionally Abused: No    Physically Abused: No    Sexually Abused: No     Review of Systems    General:  No chills, fever, night sweats or weight changes.   Cardiovascular:  No chest pain, dyspnea on exertion, edema, orthopnea, palpitations, paroxysmal nocturnal dyspnea. Dermatological: No rash, lesions/masses Respiratory: No cough, dyspnea Urologic: No hematuria, dysuria Abdominal:   No  nausea, vomiting, diarrhea, bright red blood per rectum, melena, or hematemesis Neurologic:  No visual changes, wkns, changes in mental status. All other systems reviewed and are otherwise negative except as noted above.  Physical Exam    VS:  BP 130/60   Pulse (!) 101   Ht 5' 1.5" (1.562 m)   Wt 126 lb (57.2 kg)   SpO2 94%   BMI 23.42 kg/m  , BMI Body mass index is 23.42 kg/m. GEN: Well nourished, well developed, in no acute distress. HEENT: normal. Neck: Supple, no JVD, carotid bruits, or masses. Cardiac: RRR, no murmurs, rubs, or gallops. No clubbing, cyanosis, edema.  Radials/DP/PT 2+ and equal bilaterally.  Respiratory:  Respirations regular and unlabored, clear to auscultation bilaterally. GI: Soft, nontender, nondistended, BS + x 4. MS: no deformity or atrophy. Skin: warm and dry, no rash. Neuro:  Strength and sensation are intact. Psych: Normal affect.  Accessory Clinical Findings    Recent Labs: No results found for requested labs within last 365 days.   Recent Lipid Panel    Component Value Date/Time   CHOL 144 01/08/2022 0251   TRIG 25 01/08/2022 0251   HDL 58 01/08/2022 0251   CHOLHDL 2.5 01/08/2022 0251   VLDL 5 01/08/2022 0251   LDLCALC 81 01/08/2022 0251         ECG personally reviewed by me today- EKG Interpretation Date/Time:  Friday October 03 2023 15:09:50 EST Ventricular Rate:  89 PR Interval:  168 QRS Duration:  88 QT Interval:  350 QTC Calculation: 425 R Axis:   45  Text Interpretation: Normal sinus rhythm Normal ECG When compared with ECG of 07-Jan-2022 22:18, No significant change was found Confirmed by Edd Fabian [phone]) on 10/03/2023 3:11:45 PM    Echocardiogram 04/07/23  IMPRESSIONS   1. Left  ventricular ejection fraction, by estimation, is 60 to 65%. The left ventricle has normal function. The left ventricle has no regional wall motion abnormalities. Left ventricular diastolic parameters were normal. 2. Right ventricular systolic function is normal. The right ventricular size is normal. Tricuspid regurgitation signal is inadequate for assessing PA pressure. 3. The mitral valve is normal in structure. Mild to moderate mitral valve regurgitation. No evidence of mitral stenosis. 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 4. The aortic valve is tricuspid. There is mild calcification of the aortic valve. Aortic valve regurgitation is not visualized. Aortic valve sclerosis/calcification is present, without any evidence of aortic stenosis. 5. The inferior vena cava is normal in size with greater than 50% respiratory variability, suggesting right atrial pressure of 3 mmHg.  Comparison(s): No significant change from prior study.  FINDINGS Left Ventricle: Left ventricular ejection fraction, by estimation, is 60 to 65%. The left ventricle has normal function. The left ventricle has no regional wall motion abnormalities. Global longitudinal strain performed but not reported based on interpreter judgement due to suboptimal tracking. The left ventricular internal cavity size was normal in size. There is no left ventricular hypertrophy. Left ventricular diastolic parameters were normal.  Right Ventricle: The right ventricular size is normal. No increase in right ventricular wall thickness. Right ventricular systolic function is normal. Tricuspid regurgitation signal is inadequate for assessing PA pressure.  Left Atrium: Left atrial size was normal in size.  Right Atrium: Right atrial size was normal in size.  Pericardium: There is no evidence of pericardial effusion.  Mitral Valve: Although there is a prominent jet of mitral insufficiency on color Doppler, this  occurs  only briefly at end-systole. Overall mitral insufficiency is mild-to-moderate. The mitral valve is normal in structure. 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 There is mild thickening of the anterior and posterior mitral valve leaflet(s). Mild to moderate mitral valve regurgitation, with centrally-directed jet. No evidence of mitral valve stenosis.  Tricuspid Valve: The tricuspid valve is normal in structure. Tricuspid valve regurgitation is not demonstrated. No evidence of tricuspid stenosis.  Aortic Valve: The aortic valve is tricuspid. There is mild calcification of the aortic valve. Aortic valve regurgitation is not visualized. Aortic valve sclerosis/calcification is present, without any evidence of aortic stenosis.  Pulmonic Valve: The pulmonic valve was normal in structure. Pulmonic valve regurgitation is not visualized. No evidence of pulmonic stenosis.  Aorta: The aortic root is normal in size and structure.  Venous: The inferior vena cava is normal in size with greater than 50% respiratory variability, suggesting right atrial pressure of 3 mmHg.  IAS/Shunts: No atrial level shunt detected by color flow Doppler.     Assessment & Plan   1.  DOE-contacted cardiology clinic and reported that over the prior 2 to 3 weeks she was noticing more shortness of breath and fatigue.  She denied weight gain.  She was sleeping well.  She denied dietary changes.  She denied early satiety.  Previous echocardiogram 7/24 reassuring.  Details above. Repeat complete echocardiogram Heart healthy low-sodium diet Order CBC, CMP  Takotsubo cardiomyopathy, history of myocardial infarction due to demand ischemia-previously noted to have resolved on echocardiogram 04/07/2023.  EF was noted to be 60-65%, mild-moderate mitral valve regurgitation was noted.  And no significant changes from previous study.  Hyperlipidemia-LDL 81 on 01/08/22.  Goal less than  100. Continue statin therapy High-fiber diet Maintain physical activity  Myxomatous degenerative mitral valve-most recent echocardiogram showed mild-moderate mitral valve regurgitation with no evidence of mitral valve stenosis.  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 Continue to monitor  PVCs-EKG today shows nsr 89 bpm.  Denies episodes of lightheadedness, presyncope or syncope.  Cardiac unaware.  Disposition: Follow-up with Dr. Herbie Baltimore or me in 1-2 months.   Thomasene Ripple. Novia Lansberry NP-C     10/03/2023, 3:27 PM [facility] [address] Office [phone] Fax [phone]    I spent 14 minutes examining this patient, reviewing medications, and using patient centered shared decision making involving their cardiac care.   I spent greater than 20 minutes reviewing their past medical history,  medications, and prior cardiac tests.

## 2023-09-30 NOTE — Therapy (Signed)
OUTPATIENT PHYSICAL THERAPY TREATMENT     Patient Name: [name] Trujillo MRN: [id number] DOB:1941/03/13, 83 y.o., female Today's Date: 09/30/2023  END OF SESSION:  PT End of Session - 09/30/23 1402     Visit Number 13    Date for PT Re-Evaluation 11/13/23    Authorization Type [employer]    Authorization Time Period 09/11/23 - 10/23/23    Authorization - Visit Number 6    Authorization - Number of Visits 8    Progress Note Due on Visit 20    PT Start Time 1402    PT Stop Time 1446    PT Time Calculation (min) 44 min    Activity Tolerance Patient tolerated treatment well    Behavior During Therapy [facility] for tasks assessed/performed                       Past Medical History:  Diagnosis Date   High cholesterol    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   She had ACUTE TAKOTSUBO (STRESS) CARDIOMYOPATHY with elevated Troponin Levels - this would be considered "Demand Ischemia - Demand Infarction" & NOT associated with ACS/CAD.     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  - Cath with NO CAD. ==> RESOLVED: f/u Echo 03/2022: EF 60-65%.   Past Surgical History:  Procedure Laterality Date   APPENDECTOMY     49-18 yo   BACK SURGERY     Age 9   CESAREAN SECTION     ECTOPIC PREGNANCY SURGERY     LAPAROSCOPIC HYSTERECTOMY     LEFT HEART CATH AND CORONARY ANGIOGRAPHY N/A 01/09/2022   Procedure: LEFT HEART CATH AND CORONARY ANGIOGRAPHY;  Surgeon: Tonny Bollman, MD;  Location: [facility] INVASIVE CV LAB;  Service: CV:: Widely patent coronaries with mild nonobstructive LAD plaquing.  Right dominant system.  Normal LVEDP.  Based on clinical presentation, findings are consistent with  acute Takotsubo Cardiomyopathy Syndrome   POSTERIOR FUSION LUMBAR SPINE  05/24/2022   [facility], [place]; [name], MD): L2-L3 XLIF, L2-L3 POSTERIOR DECOMPRESSION AND FUSION   TRANSTHORACIC ECHOCARDIOGRAM  01/08/2022   Severely decreased LV function-EF 25 to 30%.  Mid to apical (mostly anterior) with normal basal motion.  GR 2 DD-elevated LAP.  Mildly dilated LA.  Aortic sclerosis with no stenosis.  No AI.  Normal MV with mild to moderate TR.  Mildly elevated RAP, and PAP (estimated 49 mg). If LAD CAD ruled out - consistent with Takotsubo CM Syndrome.   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    PCP: Mosetta Putt, MD   REFERRING PROVIDER: Rondel Oh, NP   REFERRING DIAG: Z98.1 (ICD-10-CM) - S/p lumbar spinal fusion  Eval  and treat for: s/p lumbar fusion, lower extremity strengthening  THERAPY DIAG:  Muscle weakness (generalized)  Other abnormalities of gait and mobility  Unsteadiness on feet  Foot drop, left  Foot drop, right  RATIONALE FOR EVALUATION AND TREATMENT: Rehabilitation  ONSET DATE: 05/24/2022 - L2-L3 XLIF/PSF   NEXT MD VISIT: 12/31/2023 with Marlaine Hind, MD (Surgeon)   SUBJECTIVE:                                                                                                                                                                                                         SUBJECTIVE STATEMENT: Pt feels like her balance is  getting better but she still feels challenged with corner balance activities.  PAIN: Are you having pain? No  PERTINENT HISTORY:  L2-3 XLIF/PSF 05/24/22; C5-7 ACDF 09/21/20; idiopathic progressive neuropathy; hypthyroidism; DDD; remote h/o back surgery at age 4; h/o scoliosis; Takotsubo cardiomyopathy 01/10/22   PRECAUTIONS: Fall  RED FLAGS: Bowel or bladder incontinence: Yes: chronic bladder issues, better bowel control since surgery  WEIGHT BEARING RESTRICTIONS: No  FALLS:  Has patient fallen in last 6 months? No  LIVING ENVIRONMENT: Lives with: lives with their spouse Lives in: House/apartment Stairs: Yes: External: 5-6 steps; on left going up Has following equipment at home: Environmental consultant - 2 wheeled, shower chair, and hiking/walking poles  OCCUPATION: Retired Runner, broadcasting/film/video   PLOF: Independent and Leisure: walking ~30 minutes 1x/day (3x around the court near where she lives); sewing/quilting; likes working in the yard but has not been able to do so due to her balance      PATIENT GOALS: "Improved balance (to feel more comfortable in a crowd and walk more normally)."   OBJECTIVE: (objective measures completed at initial evaluation unless otherwise dated)  DIAGNOSTIC FINDINGS:  06/30/23 - XR Lumbar spine: Stable postoperative changes of the lumbar spine with evidence of spinal fusion at L2-L3.   07/01/22 - Lumbar spine x-ray: Expected postoperative appearance related to L2-L3 fixation.    06/12/21 - Lumbar MRI: Postoperative and degenerative changes of the lumbar spine as described, similar compared to July 28, 2020. At L2-L3, there is moderate spinal canal stenosis due to disc bulging, endplate osteophytic spurring, facet and ligamentum flavum hypertrophy. Milder spinal canal stenosis is seen at L3-L4. There is multilevel neural foraminal stenosis.   PATIENT SURVEYS:  ABC scale 710 / 1600 = 44.4 % Modified Oswestry 24 / 50 = 48.0 %  - Patient reports her answers are based on  difficulty performing the activities related to balance more so than pain  09/18/23: ABC  Scale: 470 / 1600 = 29.4 % Modified Oswestry: 26 / 50 = 52.0 % - Patient reports her answers are based on difficulty performing the activities related to balance more so than pain  SCREENING FOR RED FLAGS: Bowel or bladder incontinence: Yes: chronic bladder issues, better bowel control since surgery Spinal tumors: No Cauda equina syndrome: No Compression fracture: No Abdominal aneurysm: No  COGNITION:  Overall cognitive status: Within functional limits for tasks assessed    SENSATION: Light touch: Impaired - top of R foot Proprioception: Impaired    MUSCLE LENGTH: Hamstrings: mild/mod tight L>R ITB: mild tight B Piriformis: mod tight L>R Hip IR: mod tight B Hip flexors: WFL Quads: mild tight R>L Heelcord: mild/mod tight B  POSTURE:  rounded shoulders, forward head, right pelvic obliquity, and scoliosis  LUMBAR ROM: *ROM assessment limited in standing due to balance impairments  Active  Eval * 09/30/23 *  Flexion Hands to mid shin Hands to mid shin  Extension 50% limited 25% limited  Right lateral flexion Hand to mid thigh Hand to lateral knee  Left lateral flexion Hand to mid thigh Hand to lateral knee - pulling in R flank  Right rotation 50% limited 30% limited  Left rotation 50% limited 30% limited  (Blank rows = not tested)  LOWER EXTREMITY ROM:    Grossly WFL other than limitations indicated above due to muscle tightness  LOWER EXTREMITY MMT:    MMT Right eval Left eval R 09/16/23 L 09/16/23  Hip flexion 3 3 4- 4-  Hip extension 4 4 4+ 4+  Hip abduction 4- 4- 4- 4  Hip adduction 3+ 3+ 3+ 3+  Hip internal rotation 4- 3+ 4+ 4  Hip external rotation 3 3 4- 3+  Knee flexion 5 5 5 5   Knee extension 4+ 4+ 4+ 4+  Ankle dorsiflexion 3 3- 3+ 3-  Ankle plantarflexion 2 2 2+ 2+  Ankle inversion 3 3- 3+ 3-  Ankle eversion 4- 3+ 4 4-   (Blank rows = not tested)  FUNCTIONAL TESTS:   5 times sit to stand: 11.50 sec Timed up and go (TUG): 14.00 sec with single hiking pole; 12.25 sec w/o AD - 08/12/23 10 meter walk test: 14.22 sec with single walking/trekking pole; Gait speed = 2.31 ft/sec Berg Balance Scale: 31/56; < 36 high risk for falls (close to 100%) - 08/12/23 Functional gait assessment: 11/30; < 19 = high risk fall - 08/12/23   09/18/23 = 14.12 sec with single hiking pole; 14.69 sec w/o AD Gait speed = 2.32 ft/sec with single hiking pole; 2.23 ft/sec w/o AD Berg = 38/56, 37-45 significant risk for falls (>80%)  FGA = 15/30, < 19 = high risk fall   Standardized testing results as of discharge from latest PT episode (12/31/22): 5xSTS: 10.72 sec Berg: 48/56; 46-51 moderate risk for falls (>50%)  : 16.15 sec w/o AD Gait speed: 2.03 ft/sec FGA: 19/30; 19-24 = medium risk fall   GAIT: Distance walked: Clinic distances Assistive device utilized:  Single walking/trekking pole on R Level of assistance: SBA Gait pattern:  Increased sway, step through pattern, decreased stride length, trendelenburg, lateral hip instability, poor foot clearance- Right, and poor foot clearance- Left Comments: L>R foot slap   TODAY'S TREATMENT:   09/30/23  THERAPEUTIC EXERCISE: to improve flexibility, strength and mobility.  Verbal and tactile cues throughout for technique.  Rec bike - L4 x 2 min, L3 x 4 min (seat #2)  NEUROMUSCULAR RE-EDUCATION: To improve posture, balance, proprioception,  coordination, reduce fall risk, and amplitude of movement. Beach ball toss - standing Airex pad in corner with chair in front for safety, removed Airex after first few tosses d/t repeated use of walls for balance correction and a few instances of knees starting to buckle Visual scanning while walking around clinic looking for and collecting 11 cones from various heights.  THERAPEUTIC ACTIVITIES: Lumbar ROM assessment   09/25/23  THERAPEUTIC EXERCISE: to improve flexibility, strength and  mobility.  Verbal and tactile cues throughout for technique.  Rec bike - L3 x 6 min (seat #2) Seated heel raises 2 x 10 - attempted overpressure with fwd trunk lean with elbows resting on thighs for added resistance on 2nd set but pt with decreased ability to lift heels Attempted standing toe raises with back against wall but pt unable to achieve any lift of toes Seated toe raises x 10 BATCA leg press 15# x 10 BATCA ankle press 5# 2 x 5 - PT assist to keep L foot from sliding off plate  NEUROMUSCULAR RE-EDUCATION: To improve posture, balance, proprioception, coordination, reduce fall risk, and amplitude of movement. Cone knock down and righting x 6 with side-step btw cones - CGA of PT but most LOB self-corrected Standing PWR! Moves (set up with back of chair in front of patient while patient standing in front of mat table for safety, SBA/CGA of PT for all patterns): Up 2 x 10 - coordinating UE reach to counterbalance squat progressing into shoulder extension for full upright posture upon return to stand - pt still experiencing some repeated posterior LOB requiring a few steps to correct LOB on initial attempts but no unplanned sit down on mat table as on prior attempts, with patient progressively better able to regulate eccentric to concentric transition Rock x 10 alternating sides - trailing hand remaining on back of chair for balance for most repetitions with emphasis on heel raise/toe off for trailing LE Step x 10 B alternating sides - trailing hand remaining on back of chair for balance   09/23/23  THERAPEUTIC EXERCISE: to improve flexibility, strength and mobility.  Verbal and tactile cues throughout for technique.  Rec bike - L3 x 6 min (seat #2) Attempted B heel raise with counter support but pt's knees buckling into windswept position with attempts to lift heels - demonstrated seated alternative for heel raises to attempt at home  NEUROMUSCULAR RE-EDUCATION: To improve posture, balance,  proprioception, coordination, and reduce fall risk. Standing Airex pad in corner: Static stance with wide BOS  Static stance with wide BOS & arms crossed on chest Lateral weight shift in wide BOS x 20 Ant/post "church pews" with normal BOS 2 x 10 Reaching for ipsilateral cones x 10 bil - keeping eyes looking fwd Reaching for contralateral cones x 10 bil - keeping eyes looking fwd Reaching for ipsilateral cones x 10 bil - eyes following target Reaching for contralateral cones x 10 bil - eyes following target Wall bumps: Hip posterior x 10 Shoulders posterior x 10 Lateral hip x 10 bil   09/18/23 - Recert THERAPEUTIC EXERCISE: to improve flexibility, strength and mobility.  Verbal and tactile cues throughout for technique.  Rec bike - L3 x 6 min (seat #2)  THERAPEUTIC ACTIVITIES: ABC Scale: 470 / 1600 = 29.4 % Modified Oswestry: 26 / 50 = 52.0 % - Patient reports her answers are based on difficulty performing the activities related to balance more so than pain Berg = 38/56, 37-45 significant risk for falls (>80%)  =  14.12 sec with single hiking pole; 14.69 sec w/o AD Gait speed = 2.32 ft/sec with single hiking pole; 2.23 ft/sec w/o AD FGA = 15/30, < 19 = high risk fall  Goal assessment    PATIENT EDUCATION:  Education details: continue with current HEP Person educated: Patient Education method: Explanation Education comprehension: verbalized understanding  HOME EXERCISE PROGRAM: Access Code: [email or web address] URL: [email or web address] Date: 08/26/2023 Prepared by: Glenetta Hew  Exercises - Standing Gastroc Stretch at Counter  - 1-2 x daily - 7 x weekly - 3 reps - 30 sec hold - Standing Gastroc Stretch on Foam 1/2 Roll  - 1-2 x daily - 7 x weekly - 3 reps - 30 sec hold - Standing Hip Flexor Stretch  - 1-2 x daily - 7 x weekly - 3 reps - 30 sec hold - Hip Flexor Stretch with Strap on Table  - 1-2 x daily - 7 x weekly - 3 reps - 30 sec hold - Hooklying Hamstring  Stretch with Strap  - 1-2 x daily - 7 x weekly - 3 reps - 30 sec hold - Supine Iliotibial Band Stretch with Strap  - 1-2 x daily - 7 x weekly - 3 reps - 30 sec hold - Seated Table Piriformis Stretch  - 1-2 x daily - 7 x weekly - 3 reps - 30 sec hold - Standing 3-way Hip with Walker  - 1 x daily - 3 x weekly - 2 sets - 10 reps - 3 sec hold - Hip Hiking on Step  - 1 x daily - 3 x weekly - 2 sets - 10 reps - 3 sec hold - Clam with Resistance  - 1 x daily - 3 x weekly - 2 sets - 10 reps - 3-5 sec hold - Seated March with Resistance  - 1 x daily - 3 x weekly - 2 sets - 10 reps - 3 sec hold - Corner Balance Feet Together: Eyes Open With Head Turns  - 1 x daily - 7 x weekly - 2 sets - 5 reps  Standing PWR! Moves   HEP from most recent PT episode: Access Code: [email or web address] URL: [email or web address] Date: 11/28/2022 Prepared by: Glenetta Hew   Exercises - Seated Heel Toe Raises  - 1 x daily - 7 x weekly - 2 sets - 10 reps - 3 sec hold - Standing Isometric Hip Abduction with Ball on Wall  - 1 x daily - 7 x weekly - 2 sets - 10 reps - 3 sec hold - Seated Transversus Abdominis Bracing  - 2 x daily - 7 x weekly - 2 sets - 10 reps - 3-5 sec hold - Seated Isometric Hip Abduction with Resistance  - 1 x daily - 3 x weekly - 2 sets - 10 reps - 3 sec hold - Seated March with Resistance  - 1 x daily - 3 x weekly - 2 sets - 10 reps - 3 sec hold - Seated Shoulder Row with Anchored Resistance  - 1 x daily - 3 x weekly - 2 sets - 10 reps - 3-5 hold hold - Seated Single Arm Shoulder Row with Anchored Resistance  - 1 x daily - 3 x weekly - 2 sets - 10 reps - 3 sec hold - Seated Anti-Rotation Press With Anchored Resistance  - 1 x daily - 3 x weekly - 2 sets - 10 reps - 3 sec hold - Tall Kneeling Hip Hinge  - 1 x daily - 3-4 x weekly -  2 sets - 10 reps - 3 sec hold   Quadruped PWR! Up & Step   ASSESSMENT:  CLINICAL IMPRESSION: [name] Trujillo feels that her balance is improving but continues to be  challenged with unsupported static standing, often still finding herself needing to lean against walls or furniture for stability.  Continue to focus on static stance stability while performing dynamic activities, as well as challenging dynamic stability with visual scanning and bending, stooping and reaching activities.  [name] Trujillo continues to require close guarding of PT throughout balance activities, although usually able to self-correct loss of balance using stepping strategies, oftentimes requiring several steps before she is able to stabilize.  [name] Trujillo remains motivated to further improve her strength, balance and quality of mobility, and will benefit from continued skilled PT to address ongoing weakness and balance deficits to improve mobility and activity tolerance with improved stability and balance to reduce fall risk.    OBJECTIVE IMPAIRMENTS: Abnormal gait, decreased activity tolerance, decreased balance, decreased coordination, decreased endurance, decreased mobility, difficulty walking, decreased ROM, decreased strength, decreased safety awareness, increased fascial restrictions, impaired perceived functional ability, increased muscle spasms, impaired flexibility, impaired sensation, impaired tone, improper body mechanics, and postural dysfunction.   ACTIVITY LIMITATIONS: carrying, lifting, bending, sitting, standing, squatting, stairs, transfers, bed mobility, bathing, toileting, dressing, reach over head, hygiene/grooming, locomotion level, and caring for others  PARTICIPATION LIMITATIONS: meal prep, cleaning, laundry, driving, shopping, community activity, yard work, and church  PERSONAL FACTORS: Age, Past/current experiences, Time since onset of injury/illness/exacerbation, and 3+ comorbidities: L2-3 XLIF/PSF 05/24/22; C5-7 ACDF 09/21/20; idiopathic progressive neuropathy; hypthyroidism; DDD; remote h/o back surgery at age 22; h/o scoliosis; Takotsubo cardiomyopathy 01/10/22  are also affecting  patient's functional outcome.   REHAB POTENTIAL: Good  CLINICAL DECISION MAKING: Evolving/moderate complexity  EVALUATION COMPLEXITY: Moderate   GOALS: Goals reviewed with patient? Yes  SHORT TERM GOALS: Target date: 08/26/2023  Patient will be independent with initial HEP to improve outcomes and carryover.  Baseline:  Goal status: MET - 08/21/23  2.  Complete balance assessment and update LTG's as indicated.  Baseline:  Goal status: MET - 08/12/23   LONG TERM GOALS: Target date: 09/23/2023, extended to 11/13/2023  Patient will be independent with ongoing/advanced HEP for self-management at home.  Baseline:  Goal status: PARTIALLY MET - 09/11/23 - Met for current HEP; further updates anticipated  2.  Patient to demonstrate ability to achieve and maintain good spinal alignment/posturing and body mechanics needed for daily activities. Baseline:  Goal status: IN PROGRESS - 09/11/23 - Pt aware of good posture and body mechanics but limited at times by impaired balance  3.  Patient will demonstrate functional pain free lumbar ROM to perform ADLs.   Baseline: Refer to above lumbar ROM table Goal status: IN PROGRESS - 09/30/23 - lumbar ROM improving but still mostly limited by balance  4.  Patient will demonstrate improved B proximal LE strength to >/= 4 to 4+/5 for improved stability and ease of mobility . Baseline: Refer to above LE MMT table Goal status: IN PROGRESS - 09/16/23 - gains noted in overall B hip strength  5.  Patient will demonstrate improved B ankle strength to >/= 3+ to 4-/5 for improved gait stability with decreased foot slap.  Baseline: Refer to above LE MMT table Goal status: IN PROGRESS - 09/16/23 - majority of ankle motions with at least 1 muscle grade improvement in strength, excluding L ankle DF and inversion  6.  Patient will report >/= 60% on ABC scale to demonstrate  improved balance confidence.  Baseline: 710 / 1600 = 44.4 % Goal status: IN PROGRESS - 09/18/23 - 470  / 1600 = 29.4 %  7. Patient will report </= 36% on Modified Oswestry to demonstrate improved functional ability with decreased pain interference. Baseline: 24 / 50 = 48.0 % Goal status: IN PROGRESS - 09/18/23 - 26 / 50 = 52.0 %  8.  Patient will improve gait velocity to at least 2.62 ft/sec for improved gait efficiency and safety with community ambulation. Baseline: 2.31 ft/sec with single walking/trekking pole on R Goal status: IN PROGRESS - 09/18/23 - 2.32 ft/sec with single hiking pole; 2.23 ft/sec w/o AD  9.  Patient will improve Berg score to >/= 39/56 (8 points) to improve safety and stability with ADLs in standing and reduce risk for falls.  Baseline: 31/56 Goal status: IN PROGRESS - 09/18/23 - 38/56  10.  Patient will improve FGA score to >/= 15/30 (4 points) to improve gait stability and reduce risk for falls.  Baseline: 11/30 Goal status: MET & REVISED (see 10a. Below) - 09/18/23 - 15/30  10a.  Patient will improve FGA score to >/= 19/30 (baseline as of prior D/C from PT) to improve gait stability and reduce risk for falls.  Baseline: 11/30 on eval, 15/30 as of 09/18/23 Goal status: REVISED      PLAN:  PT FREQUENCY: 2x/week  PT DURATION: 8 weeks  PLANNED INTERVENTIONS: 97164- PT Re-evaluation, 97110-Therapeutic exercises, 97530- Therapeutic activity, 97112- Neuromuscular re-education, 97535- Self Care, 11914- Manual therapy, 97116- Gait training, 97014- Electrical stimulation (unattended), [phone]- Electrical stimulation (manual), [phone]- Ionotophoresis 4mg /ml Dexamethasone, Patient/Family education, Balance training, Stair training, Taping, Dry Needling, Joint mobilization, DME instructions, Cryotherapy, and Moist heat  PLAN FOR NEXT SESSION: primary focus on balance training and gait stability; progress lumbopelvic/LE strengthening and core stabilization; review & HEP PRN   Marry Guan, PT 09/30/2023, 6:19 PM

## 2023-10-02 ENCOUNTER — Encounter: Payer: Self-pay | Admitting: Physical Therapy

## 2023-10-02 ENCOUNTER — Ambulatory Visit: Payer: Medicare PPO | Admitting: Physical Therapy

## 2023-10-02 DIAGNOSIS — M21371 Foot drop, right foot: Secondary | ICD-10-CM

## 2023-10-02 DIAGNOSIS — M21372 Foot drop, left foot: Secondary | ICD-10-CM

## 2023-10-02 DIAGNOSIS — M6281 Muscle weakness (generalized): Secondary | ICD-10-CM | POA: Diagnosis not present

## 2023-10-02 DIAGNOSIS — R2689 Other abnormalities of gait and mobility: Secondary | ICD-10-CM

## 2023-10-02 DIAGNOSIS — R2681 Unsteadiness on feet: Secondary | ICD-10-CM

## 2023-10-02 NOTE — Therapy (Signed)
OUTPATIENT PHYSICAL THERAPY TREATMENT     Patient Name: [name] Trujillo MRN: [id number] DOB:1941-05-23, 83 y.o., female Today's Date: 10/02/2023  END OF SESSION:  PT End of Session - 10/02/23 1401     Visit Number 14    Date for PT Re-Evaluation 11/13/23    Authorization Type [employer]    Authorization Time Period 09/11/23 - 10/23/23    Authorization - Visit Number 7    Authorization - Number of Visits 8    Progress Note Due on Visit 20    PT Start Time 1401    PT Stop Time 1446    PT Time Calculation (min) 45 min    Activity Tolerance Patient tolerated treatment well    Behavior During Therapy [facility] for tasks assessed/performed                       Past Medical History:  Diagnosis Date   High cholesterol    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   She had ACUTE TAKOTSUBO (STRESS) CARDIOMYOPATHY with elevated Troponin Levels - this would be considered "Demand Ischemia - Demand Infarction" & NOT associated with ACS/CAD.     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  - Cath with NO CAD. ==> RESOLVED: f/u Echo 03/2022: EF 60-65%.   Past Surgical History:  Procedure Laterality Date   APPEN[age]CTOMY     29-18 yo   BACK SURGERY     Age 25   CESAREAN SECTION     ECTOPIC PREGNANCY SURGERY     LAPAROSCOPIC HYSTERECTOMY     LEFT HEART CATH AND CORONARY ANGIOGRAPHY N/A 01/09/2022   Procedure: LEFT HEART CATH AND CORONARY ANGIOGRAPHY;  Surgeon: Tonny Bollman, MD;  Location: [facility] INVASIVE CV LAB;  Service: CV:: Widely patent coronaries with mild nonobstructive LAD plaquing.  Right dominant system.  Normal LVEDP.  Based on clinical presentation, findings are consistent with  acute Takotsubo Cardiomyopathy Syndrome   POSTERIOR FUSION LUMBAR SPINE  05/24/2022   [facility], [place]; [name], MD): L2-L3 XLIF, L2-L3 POSTERIOR DECOMPRESSION AND FUSION   TRANSTHORACIC ECHOCARDIOGRAM  01/08/2022   Severely decreased LV function-EF 25 to 30%.  Mid to apical (mostly anterior) with normal basal motion.  GR 2 DD-elevated LAP.  Mildly dilated LA.  Aortic sclerosis with no stenosis.  No AI.  Normal MV with mild to moderate TR.  Mildly elevated RAP, and PAP (estimated 49 mg). If LAD CAD ruled out - consistent with Takotsubo CM Syndrome.   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    PCP: Mosetta Putt, MD   REFERRING PROVIDER: Rondel Oh, NP   REFERRING DIAG: Z98.1 (ICD-10-CM) - S/p lumbar spinal fusion  Eval  and treat for: s/p lumbar fusion, lower extremity strengthening  THERAPY DIAG:  Muscle weakness (generalized)  Other abnormalities of gait and mobility  Unsteadiness on feet  Foot drop, left  Foot drop, right  RATIONALE FOR EVALUATION AND TREATMENT: Rehabilitation  ONSET DATE: 05/24/2022 - L2-L3 XLIF/PSF   NEXT MD VISIT: 12/31/2023 with Marlaine Hind, MD (Surgeon)   SUBJECTIVE:                                                                                                                                                                                                         SUBJECTIVE STATEMENT: Pt reports she was able to  stand w/o leaning against something while getting dressed after her shower today.  PAIN: Are you having pain? No  PERTINENT HISTORY:  L2-3 XLIF/PSF 05/24/22; C5-7 ACDF 09/21/20; idiopathic progressive neuropathy; hypthyroidism; DDD; remote h/o back surgery at age 41; h/o scoliosis; Takotsubo cardiomyopathy 01/10/22   PRECAUTIONS: Fall  RED FLAGS: Bowel or bladder incontinence: Yes: chronic bladder issues, better bowel control since surgery  WEIGHT BEARING RESTRICTIONS: No  FALLS:  Has patient fallen in last 6 months? No  LIVING ENVIRONMENT: Lives with: lives with their spouse Lives in: House/apartment Stairs: Yes: External: 5-6 steps; on left going up Has following equipment at home: Environmental consultant - 2 wheeled, shower chair, and hiking/walking poles  OCCUPATION: Retired Runner, broadcasting/film/video   PLOF: Independent and Leisure: walking ~30 minutes 1x/day (3x around the court near where she lives); sewing/quilting; likes working in the yard but has not been able to do so due to her balance      PATIENT GOALS: "Improved balance (to feel more comfortable in a crowd and walk more normally)."   OBJECTIVE: (objective measures completed at initial evaluation unless otherwise dated)  DIAGNOSTIC FINDINGS:  06/30/23 - XR Lumbar spine: Stable postoperative changes of the lumbar spine with evidence of spinal fusion at L2-L3.   07/01/22 - Lumbar spine x-ray: Expected postoperative appearance related to L2-L3 fixation.    06/12/21 - Lumbar MRI: Postoperative and degenerative changes of the lumbar spine as described, similar compared to July 28, 2020. At L2-L3, there is moderate spinal canal stenosis due to disc bulging, endplate osteophytic spurring, facet and ligamentum flavum hypertrophy. Milder spinal canal stenosis is seen at L3-L4. There is multilevel neural foraminal stenosis.   PATIENT SURVEYS:  ABC scale 710 / 1600 = 44.4 % Modified Oswestry 24 / 50 = 48.0 %  - Patient reports her answers are based on  difficulty performing the activities related to balance more so than pain  09/18/23:  ABC Scale: 470 / 1600 = 29.4 % Modified Oswestry: 26 / 50 = 52.0 % - Patient reports her answers are based on difficulty performing the activities related to balance more so than pain  SCREENING FOR RED FLAGS: Bowel or bladder incontinence: Yes: chronic bladder issues, better bowel control since surgery Spinal tumors: No Cauda equina syndrome: No Compression fracture: No Abdominal aneurysm: No  COGNITION:  Overall cognitive status: Within functional limits for tasks assessed    SENSATION: Light touch: Impaired - top of R foot Proprioception: Impaired    MUSCLE LENGTH: Hamstrings: mild/mod tight L>R ITB: mild tight B Piriformis: mod tight L>R Hip IR: mod tight B Hip flexors: WFL Quads: mild tight R>L Heelcord: mild/mod tight B  POSTURE:  rounded shoulders, forward head, right pelvic obliquity, and scoliosis  LUMBAR ROM: *ROM assessment limited in standing due to balance impairments  Active  Eval * 09/30/23 *  Flexion Hands to mid shin Hands to mid shin  Extension 50% limited 25% limited  Right lateral flexion Hand to mid thigh Hand to lateral knee  Left lateral flexion Hand to mid thigh Hand to lateral knee - pulling in R flank  Right rotation 50% limited 30% limited  Left rotation 50% limited 30% limited  (Blank rows = not tested)  LOWER EXTREMITY ROM:    Grossly WFL other than limitations indicated above due to muscle tightness  LOWER EXTREMITY MMT:    MMT Right eval Left eval R 09/16/23 L 09/16/23  Hip flexion 3 3 4- 4-  Hip extension 4 4 4+ 4+  Hip abduction 4- 4- 4- 4  Hip adduction 3+ 3+ 3+ 3+  Hip internal rotation 4- 3+ 4+ 4  Hip external rotation 3 3 4- 3+  Knee flexion 5 5 5 5   Knee extension 4+ 4+ 4+ 4+  Ankle dorsiflexion 3 3- 3+ 3-  Ankle plantarflexion 2 2 2+ 2+  Ankle inversion 3 3- 3+ 3-  Ankle eversion 4- 3+ 4 4-   (Blank rows = not tested)  FUNCTIONAL TESTS:   5 times sit to stand: 11.50 sec Timed up and go (TUG): 14.00 sec with single hiking pole; 12.25 sec w/o AD - 08/12/23 10 meter walk test: 14.22 sec with single walking/trekking pole; Gait speed = 2.31 ft/sec Berg Balance Scale: 31/56; < 36 high risk for falls (close to 100%) - 08/12/23 Functional gait assessment: 11/30; < 19 = high risk fall - 08/12/23   09/18/23 = 14.12 sec with single hiking pole; 14.69 sec w/o AD Gait speed = 2.32 ft/sec with single hiking pole; 2.23 ft/sec w/o AD Berg = 38/56, 37-45 significant risk for falls (>80%)  FGA = 15/30, < 19 = high risk fall   Standardized testing results as of discharge from latest PT episode (12/31/22): 5xSTS: 10.72 sec Berg: 48/56; 46-51 moderate risk for falls (>50%)  : 16.15 sec w/o AD Gait speed: 2.03 ft/sec FGA: 19/30; 19-24 = medium risk fall   GAIT: Distance walked: Clinic distances Assistive device utilized:  Single walking/trekking pole on R Level of assistance: SBA Gait pattern:  Increased sway, step through pattern, decreased stride length, trendelenburg, lateral hip instability, poor foot clearance- Right, and poor foot clearance- Left Comments: L>R foot slap   TODAY'S TREATMENT:   10/02/23  THERAPEUTIC EXERCISE: to improve flexibility, strength and mobility.  Verbal and tactile cues throughout for technique.  Rec bike - L4 x 6 min (seat #2) SLS + 3-way hip swing x 10 bil, single UE support  on back of chair for balance - R SLS on 2" block for L foot clearance SLS + 3-way hip semi-circle arc x 10 bil, single UE support on back of chair for balance - R SLS on 2" block for L foot clearance  NEUROMUSCULAR RE-EDUCATION: To improve posture, balance, proprioception, coordination, reduce fall risk, and amplitude of movement. SBA/CGA of PT for all activities. Bean bag reach & toss - 2 x 11 bean bags, 1 set each from each side with bags held at various heights and distances as pt alternating R & L hand reaches - initial  reaching and tosses from standing on firm surface, progressing to standing on Airex pad Figure 8 walking forward around 2 cones placed 6" apart  Figure 8 walking side-ways/diagonal around 2 cones placed 6" apart - multiple minor LOB requiring PT intervention to stabilize   09/30/23  THERAPEUTIC EXERCISE: to improve flexibility, strength and mobility.  Verbal and tactile cues throughout for technique.  Rec bike - L4 x 2 min, L3 x 4 min (seat #2)  NEUROMUSCULAR RE-EDUCATION: To improve posture, balance, proprioception, coordination, reduce fall risk, and amplitude of movement. Beach ball toss - standing Airex pad in corner with chair in front for safety, removed Airex after first few tosses d/t repeated use of walls for balance correction and a few instances of knees starting to buckle Visual scanning while walking around clinic looking for and collecting 11 cones from various heights.  THERAPEUTIC ACTIVITIES: Lumbar ROM assessment   09/25/23  THERAPEUTIC EXERCISE: to improve flexibility, strength and mobility.  Verbal and tactile cues throughout for technique.  Rec bike - L3 x 6 min (seat #2) Seated heel raises 2 x 10 - attempted overpressure with fwd trunk lean with elbows resting on thighs for added resistance on 2nd set but pt with decreased ability to lift heels Attempted standing toe raises with back against wall but pt unable to achieve any lift of toes Seated toe raises x 10 BATCA leg press 15# x 10 BATCA ankle press 5# 2 x 5 - PT assist to keep L foot from sliding off plate  NEUROMUSCULAR RE-EDUCATION: To improve posture, balance, proprioception, coordination, reduce fall risk, and amplitude of movement. Cone knock down and righting x 6 with side-step btw cones - CGA of PT but most LOB self-corrected Standing PWR! Moves (set up with back of chair in front of patient while patient standing in front of mat table for safety, SBA/CGA of PT for all patterns): Up 2 x 10 - coordinating UE  reach to counterbalance squat progressing into shoulder extension for full upright posture upon return to stand - pt still experiencing some repeated posterior LOB requiring a few steps to correct LOB on initial attempts but no unplanned sit down on mat table as on prior attempts, with patient progressively better able to regulate eccentric to concentric transition Rock x 10 alternating sides - trailing hand remaining on back of chair for balance for most repetitions with emphasis on heel raise/toe off for trailing LE Step x 10 B alternating sides - trailing hand remaining on back of chair for balance   PATIENT EDUCATION:  Education details: continue with current HEP Person educated: Patient Education method: Explanation Education comprehension: verbalized understanding  HOME EXERCISE PROGRAM: Access Code: [email or web address] URL: [email or web address] Date: 08/26/2023 Prepared by: Glenetta Hew  Exercises - Standing Gastroc Stretch at Counter  - 1-2 x daily - 7 x weekly - 3 reps - 30 sec hold - Standing Gastroc Stretch on  Foam 1/2 Roll  - 1-2 x daily - 7 x weekly - 3 reps - 30 sec hold - Standing Hip Flexor Stretch  - 1-2 x daily - 7 x weekly - 3 reps - 30 sec hold - Hip Flexor Stretch with Strap on Table  - 1-2 x daily - 7 x weekly - 3 reps - 30 sec hold - Hooklying Hamstring Stretch with Strap  - 1-2 x daily - 7 x weekly - 3 reps - 30 sec hold - Supine Iliotibial Band Stretch with Strap  - 1-2 x daily - 7 x weekly - 3 reps - 30 sec hold - Seated Table Piriformis Stretch  - 1-2 x daily - 7 x weekly - 3 reps - 30 sec hold - Standing 3-way Hip with Walker  - 1 x daily - 3 x weekly - 2 sets - 10 reps - 3 sec hold - Hip Hiking on Step  - 1 x daily - 3 x weekly - 2 sets - 10 reps - 3 sec hold - Clam with Resistance  - 1 x daily - 3 x weekly - 2 sets - 10 reps - 3-5 sec hold - Seated March with Resistance  - 1 x daily - 3 x weekly - 2 sets - 10 reps - 3 sec hold - Corner Balance Feet  Together: Eyes Open With Head Turns  - 1 x daily - 7 x weekly - 2 sets - 5 reps  Standing PWR! Moves   HEP from most recent PT episode: Access Code: [email or web address] URL: [email or web address] Date: 11/28/2022 Prepared by: Glenetta Hew   Exercises - Seated Heel Toe Raises  - 1 x daily - 7 x weekly - 2 sets - 10 reps - 3 sec hold - Standing Isometric Hip Abduction with Ball on Wall  - 1 x daily - 7 x weekly - 2 sets - 10 reps - 3 sec hold - Seated Transversus Abdominis Bracing  - 2 x daily - 7 x weekly - 2 sets - 10 reps - 3-5 sec hold - Seated Isometric Hip Abduction with Resistance  - 1 x daily - 3 x weekly - 2 sets - 10 reps - 3 sec hold - Seated March with Resistance  - 1 x daily - 3 x weekly - 2 sets - 10 reps - 3 sec hold - Seated Shoulder Row with Anchored Resistance  - 1 x daily - 3 x weekly - 2 sets - 10 reps - 3-5 hold hold - Seated Single Arm Shoulder Row with Anchored Resistance  - 1 x daily - 3 x weekly - 2 sets - 10 reps - 3 sec hold - Seated Anti-Rotation Press With Anchored Resistance  - 1 x daily - 3 x weekly - 2 sets - 10 reps - 3 sec hold - Tall Kneeling Hip Hinge  - 1 x daily - 3-4 x weekly - 2 sets - 10 reps - 3 sec hold   Quadruped PWR! Up & Step   ASSESSMENT:  CLINICAL IMPRESSION: Kristol reported that she was able to balance independent of external while getting dressed after her shower today which is something she has had trouble doing.  Therapeutic activities continuing to work on unsupported static stance incorporating reaching and weight shifting activities on firm and uneven surfaces.  Patient noting more difficulty with reaching and weight shifting to right versus left.  Dynamic stepping working on turns and multidirectional stepping.  Continued difficulty with lateral control with sidestepping especially,  therefore also worked on glute [employer] with patient noting quick fatigue with most activities.  Sidnee remains motivated to further  improve her strength, balance and quality of mobility, and will benefit from continued skilled PT to address ongoing weakness and balance deficits to improve mobility and activity tolerance with improved stability and balance to reduce fall risk.    OBJECTIVE IMPAIRMENTS: Abnormal gait, decreased activity tolerance, decreased balance, decreased coordination, decreased endurance, decreased mobility, difficulty walking, decreased ROM, decreased strength, decreased safety awareness, increased fascial restrictions, impaired perceived functional ability, increased muscle spasms, impaired flexibility, impaired sensation, impaired tone, improper body mechanics, and postural dysfunction.   ACTIVITY LIMITATIONS: carrying, lifting, bending, sitting, standing, squatting, stairs, transfers, bed mobility, bathing, toileting, dressing, reach over head, hygiene/grooming, locomotion level, and caring for others  PARTICIPATION LIMITATIONS: meal prep, cleaning, laundry, driving, shopping, community activity, yard work, and church  PERSONAL FACTORS: Age, Past/current experiences, Time since onset of injury/illness/exacerbation, and 3+ comorbidities: L2-3 XLIF/PSF 05/24/22; C5-7 ACDF 09/21/20; idiopathic progressive neuropathy; hypthyroidism; DDD; remote h/o back surgery at age 38; h/o scoliosis; Takotsubo cardiomyopathy 01/10/22  are also affecting patient's functional outcome.   REHAB POTENTIAL: Good  CLINICAL DECISION MAKING: Evolving/moderate complexity  EVALUATION COMPLEXITY: Moderate   GOALS: Goals reviewed with patient? Yes  SHORT TERM GOALS: Target date: 08/26/2023  Patient will be independent with initial HEP to improve outcomes and carryover.  Baseline:  Goal status: MET - 08/21/23  2.  Complete balance assessment and update LTG's as indicated.  Baseline:  Goal status: MET - 08/12/23   LONG TERM GOALS: Target date: 09/23/2023, extended to 11/13/2023  Patient will be independent with ongoing/advanced  HEP for self-management at home.  Baseline:  Goal status: PARTIALLY MET - 09/11/23 - Met for current HEP; further updates anticipated  2.  Patient to demonstrate ability to achieve and maintain good spinal alignment/posturing and body mechanics needed for daily activities. Baseline:  Goal status: IN PROGRESS - 09/11/23 - Pt aware of good posture and body mechanics but limited at times by impaired balance  3.  Patient will demonstrate functional pain free lumbar ROM to perform ADLs.   Baseline: Refer to above lumbar ROM table Goal status: IN PROGRESS - 09/30/23 - lumbar ROM improving but still mostly limited by balance  4.  Patient will demonstrate improved B proximal LE strength to >/= 4 to 4+/5 for improved stability and ease of mobility . Baseline: Refer to above LE MMT table Goal status: IN PROGRESS - 09/16/23 - gains noted in overall B hip strength  5.  Patient will demonstrate improved B ankle strength to >/= 3+ to 4-/5 for improved gait stability with decreased foot slap.  Baseline: Refer to above LE MMT table Goal status: IN PROGRESS - 09/16/23 - majority of ankle motions with at least 1 muscle grade improvement in strength, excluding L ankle DF and inversion  6.  Patient will report >/= 60% on ABC scale to demonstrate improved balance confidence.  Baseline: 710 / 1600 = 44.4 % Goal status: IN PROGRESS - 09/18/23 - 470 / 1600 = 29.4 %  7. Patient will report </= 36% on Modified Oswestry to demonstrate improved functional ability with decreased pain interference. Baseline: 24 / 50 = 48.0 % Goal status: IN PROGRESS - 09/18/23 - 26 / 50 = 52.0 %  8.  Patient will improve gait velocity to at least 2.62 ft/sec for improved gait efficiency and safety with community ambulation. Baseline: 2.31 ft/sec with single walking/trekking pole on R Goal status:  IN PROGRESS - 09/18/23 - 2.32 ft/sec with single hiking pole; 2.23 ft/sec w/o AD  9.  Patient will improve Berg score to >/= 39/56 (8 points) to  improve safety and stability with ADLs in standing and reduce risk for falls.  Baseline: 31/56 Goal status: IN PROGRESS - 09/18/23 - 38/56  10.  Patient will improve FGA score to >/= 15/30 (4 points) to improve gait stability and reduce risk for falls.  Baseline: 11/30 Goal status: MET & REVISED (see 10a. Below) - 09/18/23 - 15/30  10a.  Patient will improve FGA score to >/= 19/30 (baseline as of prior D/C from PT) to improve gait stability and reduce risk for falls.  Baseline: 11/30 on eval, 15/30 as of 09/18/23 Goal status: REVISED      PLAN:  PT FREQUENCY: 2x/week  PT DURATION: 8 weeks  PLANNED INTERVENTIONS: 97164- PT Re-evaluation, 97110-Therapeutic exercises, 97530- Therapeutic activity, 97112- Neuromuscular re-education, 97535- Self Care, 08657- Manual therapy, 97116- Gait training, 97014- Electrical stimulation (unattended), [phone]- Electrical stimulation (manual), [phone]- Ionotophoresis 4mg /ml Dexamethasone, Patient/Family education, Balance training, Stair training, Taping, Dry Needling, Joint mobilization, DME instructions, Cryotherapy, and Moist heat  PLAN FOR NEXT SESSION: Request additional visits [employer] prior authorization; primary focus on balance training and gait stability; progress lumbopelvic/LE strengthening and core stabilization; review & HEP PRN   Marry Guan, PT 10/02/2023, 2:48 PM

## 2023-10-03 ENCOUNTER — Ambulatory Visit: Payer: Medicare PPO | Attending: General Practice | Admitting: General Practice

## 2023-10-03 ENCOUNTER — Encounter: Payer: Self-pay | Admitting: General Practice

## 2023-10-03 VITALS — BP 130/60 | HR 101 | Ht 61.5 in | Wt 126.0 lb

## 2023-10-03 DIAGNOSIS — R0609 Other forms of dyspnea: Secondary | ICD-10-CM | POA: Diagnosis not present

## 2023-10-03 DIAGNOSIS — I3489 Other nonrheumatic mitral valve disorders: Secondary | ICD-10-CM | POA: Diagnosis not present

## 2023-10-03 DIAGNOSIS — E785 Hyperlipidemia, unspecified: Secondary | ICD-10-CM | POA: Diagnosis not present

## 2023-10-03 DIAGNOSIS — I493 Ventricular premature depolarization: Secondary | ICD-10-CM

## 2023-10-03 NOTE — Patient Instructions (Signed)
Medication Instructions:  The current medical regimen is effective;  continue present plan and medications as directed. Please refer to the Current Medication list given to you today.  *If you need a refill on your cardiac medications before your next appointment, please call your pharmacy*  Lab Work: NONE  Testing/Procedures: Your physician has requested that you have an echocardiogram. Echocardiography is a painless test that uses sound waves to create images of your heart. It provides your doctor with information about the size and shape of your heart and how well your heart's chambers and valves are working. This procedure takes approximately one hour. There are no restrictions for this procedure. Please do NOT wear cologne, perfume, aftershave, or lotions (deodorant is allowed). Please arrive 15 minutes prior to your appointment time.  Please note: We ask at that you not bring children with you during ultrasound (echo/ vascular) testing. Due to room size and safety concerns, children are not allowed in the ultrasound rooms during exams. Our front office staff cannot provide observation of children in our lobby area while testing is being conducted. An adult accompanying a patient to their appointment will only be allowed in the ultrasound room at the discretion of the ultrasound technician under special circumstances. We apologize for any inconvenience.  Other Instructions MAINTAIN PHYSICAL ACTIVITY  Follow-Up: At [facility], you and your health needs are our priority.  As part of our continuing mission to provide you with exceptional heart care, we have created designated Provider Care Teams.  These Care Teams include your primary Cardiologist (physician) and Advanced Practice Providers (APPs -  Physician Assistants and Nurse Practitioners) who all work together to provide you with the care you need, when you need it.  Your next appointment:   1-2 month(s)  Provider:   Bryan Lemma, MD  or Edd Fabian, FNP

## 2023-10-04 LAB — BASIC METABOLIC PANEL
BUN/Creatinine Ratio: 27 (ref 12–28)
BUN: 23 mg/dL (ref 8–27)
CO2: 19 mmol/L — ABNORMAL LOW (ref 20–29)
Calcium: 10.2 mg/dL (ref 8.7–10.3)
Chloride: 104 mmol/L (ref 96–106)
Creatinine, Ser: 0.86 mg/dL (ref 0.57–1.00)
Glucose: 100 mg/dL — ABNORMAL HIGH (ref 70–99)
Potassium: 4.6 mmol/L (ref 3.5–5.2)
Sodium: 142 mmol/L (ref 134–144)
eGFR: 68 mL/min/{1.73_m2} (ref >59)

## 2023-10-04 LAB — CBC
Hematocrit: 39.4 % (ref 34.0–46.6)
Hemoglobin: 13.3 g/dL (ref 11.1–15.9)
MCH: 32 pg (ref 26.6–33.0)
MCHC: 33.8 g/dL (ref 31.5–35.7)
MCV: 95 fL (ref 79–97)
Platelets: 297 10*3/uL (ref 150–450)
RBC: 4.16 x10E6/uL (ref 3.77–5.28)
RDW: 11.6 % — ABNORMAL LOW (ref 11.7–15.4)
WBC: 7.2 10*3/uL (ref 3.4–10.8)

## 2023-10-04 LAB — THYROID PANEL WITH TSH
Free Thyroxine Index: 2.9 (ref 1.2–4.9)
T3 Uptake Ratio: 32 % (ref 24–39)
T4, Total: 9.1 ug/dL (ref 4.5–12.0)
TSH: 1.01 u[IU]/mL (ref 0.450–4.500)

## 2023-10-07 ENCOUNTER — Ambulatory Visit: Payer: Medicare PPO | Admitting: Physical Therapy

## 2023-10-07 ENCOUNTER — Encounter: Payer: Self-pay | Admitting: Physical Therapy

## 2023-10-07 DIAGNOSIS — M21371 Foot drop, right foot: Secondary | ICD-10-CM

## 2023-10-07 DIAGNOSIS — R2681 Unsteadiness on feet: Secondary | ICD-10-CM

## 2023-10-07 DIAGNOSIS — M6281 Muscle weakness (generalized): Secondary | ICD-10-CM | POA: Diagnosis not present

## 2023-10-07 DIAGNOSIS — R2689 Other abnormalities of gait and mobility: Secondary | ICD-10-CM

## 2023-10-07 DIAGNOSIS — M21372 Foot drop, left foot: Secondary | ICD-10-CM

## 2023-10-07 NOTE — Therapy (Signed)
OUTPATIENT PHYSICAL THERAPY TREATMENT     Patient Name: [name] Trujillo MRN: [id number] DOB:1940-12-09, 83 y.o., female Today's Date: 10/07/2023  END OF SESSION:  PT End of Session - 10/07/23 1312     Visit Number 15    Date for PT Re-Evaluation 11/13/23    Authorization Type [employer]    Authorization Time Period 09/11/23 - 10/23/23    Authorization - Visit Number 8    Authorization - Number of Visits 8    Progress Note Due on Visit 20    PT Start Time 1312    PT Stop Time 1400    PT Time Calculation (min) 48 min    Activity Tolerance Patient tolerated treatment well    Behavior During Therapy [facility] for tasks assessed/performed                        Past Medical History:  Diagnosis Date   High cholesterol    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   She had ACUTE TAKOTSUBO (STRESS) CARDIOMYOPATHY with elevated Troponin Levels - this would be considered "Demand Ischemia - Demand Infarction" & NOT associated with ACS/CAD.     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  - Cath with NO CAD. ==> RESOLVED: f/u Echo 03/2022: EF 60-65%.   Past Surgical History:  Procedure Laterality Date   APPEN[age]CTOMY     32-20 yo   BACK SURGERY     Age 34   CESAREAN SECTION     ECTOPIC PREGNANCY SURGERY     LAPAROSCOPIC HYSTERECTOMY     LEFT HEART CATH AND CORONARY ANGIOGRAPHY N/A 01/09/2022   Procedure: LEFT HEART CATH AND CORONARY ANGIOGRAPHY;  Surgeon: Tonny Bollman, MD;  Location: [facility] INVASIVE CV LAB;  Service: CV:: Widely patent coronaries with mild nonobstructive LAD plaquing.  Right dominant system.  Normal LVEDP.  Based on clinical presentation, findings are consistent with  acute Takotsubo Cardiomyopathy Syndrome   POSTERIOR FUSION LUMBAR SPINE  05/24/2022   [facility], [place]; [name], MD): L2-L3 XLIF, L2-L3 POSTERIOR DECOMPRESSION AND FUSION   TRANSTHORACIC ECHOCARDIOGRAM  01/08/2022   Severely decreased LV function-EF 25 to 30%.  Mid to apical (mostly anterior) with normal basal motion.  GR 2 DD-elevated LAP.  Mildly dilated LA.  Aortic sclerosis with no stenosis.  No AI.  Normal MV with mild to moderate TR.  Mildly elevated RAP, and PAP (estimated 49 mg). If LAD CAD ruled out - consistent with Takotsubo CM Syndrome.   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    PCP: Mosetta Putt, MD   REFERRING PROVIDER: Rondel Oh, NP   REFERRING DIAG: Z98.1 (ICD-10-CM) - S/p lumbar spinal fusion  Eval and treat for: s/p lumbar fusion, lower extremity strengthening  THERAPY DIAG:  Muscle weakness (generalized)  Other abnormalities of gait and mobility  Unsteadiness on feet  Foot drop, left  Foot drop, right  RATIONALE FOR EVALUATION AND TREATMENT: Rehabilitation  ONSET DATE: 05/24/2022 - L2-L3 XLIF/PSF   NEXT MD VISIT: 12/31/2023 with Marlaine Hind, MD (Surgeon)   SUBJECTIVE:                                                                                                                                                                                                         SUBJECTIVE STATEMENT: Pt reports she had a busy  weekend with visits from out of town children and hosting an 85rd birthday party for her husband.  PAIN: Are you having pain? No  PERTINENT HISTORY:  L2-3 XLIF/PSF 05/24/22; C5-7 ACDF 09/21/20; idiopathic progressive neuropathy; hypthyroidism; DDD; remote h/o back surgery at age 22; h/o scoliosis; Takotsubo cardiomyopathy 01/10/22   PRECAUTIONS: Fall  RED FLAGS: Bowel or bladder incontinence: Yes: chronic bladder issues, better bowel control since surgery  WEIGHT BEARING RESTRICTIONS: No  FALLS:  Has patient fallen in last 6 months? No  LIVING ENVIRONMENT: Lives with: lives with their spouse Lives in: House/apartment Stairs: Yes: External: 5-6 steps; on left going up Has following equipment at home: Environmental consultant - 2 wheeled, shower chair, and hiking/walking poles  OCCUPATION: Retired Runner, broadcasting/film/video   PLOF: Independent and Leisure: walking ~30 minutes 1x/day (3x around the court near where she lives); sewing/quilting; likes working in the yard but has not been able to do so due to her balance      PATIENT GOALS: "Improved balance (to feel more comfortable in a crowd and walk more normally)."   OBJECTIVE: (objective measures completed at initial evaluation unless otherwise dated)  DIAGNOSTIC FINDINGS:  06/30/23 - XR Lumbar spine: Stable postoperative changes of the lumbar spine with evidence of spinal fusion at L2-L3.   07/01/22 - Lumbar spine x-ray: Expected postoperative appearance related to L2-L3 fixation.    06/12/21 - Lumbar MRI: Postoperative and degenerative changes of the lumbar spine as described, similar compared to July 28, 2020. At L2-L3, there is moderate spinal canal stenosis due to disc bulging, endplate osteophytic spurring, facet and ligamentum flavum hypertrophy. Milder spinal canal stenosis is seen at L3-L4. There is multilevel neural foraminal stenosis.   PATIENT SURVEYS:  ABC scale 710 / 1600 = 44.4 % Modified Oswestry 24 / 50 = 48.0 %  - Patient reports her answers  are based on difficulty performing the activities related to balance  more so than pain  09/18/23: ABC Scale: 470 / 1600 = 29.4 % Modified Oswestry: 26 / 50 = 52.0 % - Patient reports her answers are based on difficulty performing the activities related to balance more so than pain  SCREENING FOR RED FLAGS: Bowel or bladder incontinence: Yes: chronic bladder issues, better bowel control since surgery Spinal tumors: No Cauda equina syndrome: No Compression fracture: No Abdominal aneurysm: No  COGNITION:  Overall cognitive status: Within functional limits for tasks assessed    SENSATION: Light touch: Impaired - top of R foot Proprioception: Impaired    MUSCLE LENGTH: Hamstrings: mild/mod tight L>R ITB: mild tight B Piriformis: mod tight L>R Hip IR: mod tight B Hip flexors: WFL Quads: mild tight R>L Heelcord: mild/mod tight B  POSTURE:  rounded shoulders, forward head, right pelvic obliquity, and scoliosis  LUMBAR ROM: *ROM assessment limited in standing due to balance impairments  Active  Eval * 09/30/23 *  Flexion Hands to mid shin Hands to mid shin  Extension 50% limited 25% limited  Right lateral flexion Hand to mid thigh Hand to lateral knee  Left lateral flexion Hand to mid thigh Hand to lateral knee - pulling in R flank  Right rotation 50% limited 30% limited  Left rotation 50% limited 30% limited  (Blank rows = not tested)  LOWER EXTREMITY ROM:    Grossly WFL other than limitations indicated above due to muscle tightness  LOWER EXTREMITY MMT:    MMT Right eval Left eval R 09/16/23 L 09/16/23 R 10/07/23 L 10/07/23  Hip flexion 3 3 4- 4- 4- 4  Hip extension 4 4 4+ 4+ 5 5  Hip abduction 4- 4- 4- 4 4 4+  Hip adduction 3+ 3+ 3+ 3+ 3+ 4-  Hip internal rotation 4- 3+ 4+ 4 4+ 4+  Hip external rotation 3 3 4- 3+ 4- 4-  Knee flexion 5 5 5 5 5 5   Knee extension 4+ 4+ 4+ 4+ 5 5  Ankle dorsiflexion 3 3- 3+ 3- 4- 3  Ankle plantarflexion 2 2 2+ 2+ 2+ 4 in NWB 2+ 4 in NWB   Ankle inversion 3 3- 3+ 3- 3+ 3-  Ankle eversion 4- 3+ 4 4- 4+ 4   (Blank rows = not tested)  FUNCTIONAL TESTS:  5 times sit to stand: 11.50 sec Timed up and go (TUG): 14.00 sec with single hiking pole; 12.25 sec w/o AD - 08/12/23 10 meter walk test: 14.22 sec with single walking/trekking pole; Gait speed = 2.31 ft/sec Berg Balance Scale: 31/56; < 36 high risk for falls (close to 100%) - 08/12/23 Functional gait assessment: 11/30; < 19 = high risk fall - 08/12/23   09/18/23 = 14.12 sec with single hiking pole; 14.69 sec w/o AD Gait speed = 2.32 ft/sec with single hiking pole; 2.23 ft/sec w/o AD Berg = 38/56, 37-45 significant risk for falls (>80%)  FGA = 15/30, < 19 = high risk fall   Standardized testing results as of discharge from latest PT episode (12/31/22): 5xSTS: 10.72 sec Berg: 48/56; 46-51 moderate risk for falls (>50%)  : 16.15 sec w/o AD Gait speed: 2.03 ft/sec FGA: 19/30; 19-24 = medium risk fall   GAIT: Distance walked: Clinic distances Assistive device utilized:  Single walking/trekking pole on R Level of assistance: SBA Gait pattern:  Increased sway, step through pattern, decreased stride length, trendelenburg, lateral hip instability, poor foot clearance- Right, and poor foot clearance- Left Comments: L>R foot slap   TODAY'S TREATMENT:  10/07/23  THERAPEUTIC EXERCISE: to improve flexibility, strength and mobility.  Verbal and tactile cues throughout for technique.  Rec bike - L4 x 3 min, L3 x 3 min (seat #2) Fitter hip ABD (2 blue) x 15 Fitter hip extension (1 black) x 15 Side sitting figure 4 ankle inversion x 5 bil - limited ROM Seated B ankle inversion isometric into small ball 10 x 5"   THERAPEUTIC ACTIVITIES: LE MMT :  12.66 sec with single hiking pole, 12.53 sec w/o AD Gait speed:  2.59 ft/sec with single hiking pole, 2.62 ft.sec w/o AD   10/02/23  THERAPEUTIC EXERCISE: to improve flexibility, strength and mobility.  Verbal and tactile  cues throughout for technique.  Rec bike - L4 x 6 min (seat #2) SLS + 3-way hip swing x 10 bil, single UE support on back of chair for balance - R SLS on 2" block for L foot clearance SLS + 3-way hip semi-circle arc x 10 bil, single UE support on back of chair for balance - R SLS on 2" block for L foot clearance  NEUROMUSCULAR RE-EDUCATION: To improve posture, balance, proprioception, coordination, reduce fall risk, and amplitude of movement. SBA/CGA of PT for all activities. Bean bag reach & toss - 2 x 11 bean bags, 1 set each from each side with bags held at various heights and distances as pt alternating R & L hand reaches - initial reaching and tosses from standing on firm surface, progressing to standing on Airex pad Figure 8 walking forward around 2 cones placed 6" apart  Figure 8 walking side-ways/diagonal around 2 cones placed 6" apart - multiple minor LOB requiring PT intervention to stabilize   09/30/23  THERAPEUTIC EXERCISE: to improve flexibility, strength and mobility.  Verbal and tactile cues throughout for technique.  Rec bike - L4 x 2 min, L3 x 4 min (seat #2)  NEUROMUSCULAR RE-EDUCATION: To improve posture, balance, proprioception, coordination, reduce fall risk, and amplitude of movement. Beach ball toss - standing Airex pad in corner with chair in front for safety, removed Airex after first few tosses d/t repeated use of walls for balance correction and a few instances of knees starting to buckle Visual scanning while walking around clinic looking for and collecting 11 cones from various heights.  THERAPEUTIC ACTIVITIES: Lumbar ROM assessment   09/25/23  THERAPEUTIC EXERCISE: to improve flexibility, strength and mobility.  Verbal and tactile cues throughout for technique.  Rec bike - L3 x 6 min (seat #2) Seated heel raises 2 x 10 - attempted overpressure with fwd trunk lean with elbows resting on thighs for added resistance on 2nd set but pt with decreased ability to lift  heels Attempted standing toe raises with back against wall but pt unable to achieve any lift of toes Seated toe raises x 10 BATCA leg press 15# x 10 BATCA ankle press 5# 2 x 5 - PT assist to keep L foot from sliding off plate  NEUROMUSCULAR RE-EDUCATION: To improve posture, balance, proprioception, coordination, reduce fall risk, and amplitude of movement. Cone knock down and righting x 6 with side-step btw cones - CGA of PT but most LOB self-corrected Standing PWR! Moves (set up with back of chair in front of patient while patient standing in front of mat table for safety, SBA/CGA of PT for all patterns): Up 2 x 10 - coordinating UE reach to counterbalance squat progressing into shoulder extension for full upright posture upon return to stand - pt still experiencing some repeated posterior LOB  requiring a few steps to correct LOB on initial attempts but no unplanned sit down on mat table as on prior attempts, with patient progressively better able to regulate eccentric to concentric transition Rock x 10 alternating sides - trailing hand remaining on back of chair for balance for most repetitions with emphasis on heel raise/toe off for trailing LE Step x 10 B alternating sides - trailing hand remaining on back of chair for balance   PATIENT EDUCATION:  Education details: progress with PT, ongoing PT POC, HEP review, and continue with current HEP Person educated: Patient Education method: Explanation Education comprehension: verbalized understanding  HOME EXERCISE PROGRAM: Access Code: [email or web address] URL: [email or web address] Date: 08/26/2023 Prepared by: Glenetta Hew  Exercises - Standing Gastroc Stretch at Counter  - 1-2 x daily - 7 x weekly - 3 reps - 30 sec hold - Standing Gastroc Stretch on Foam 1/2 Roll  - 1-2 x daily - 7 x weekly - 3 reps - 30 sec hold - Standing Hip Flexor Stretch  - 1-2 x daily - 7 x weekly - 3 reps - 30 sec hold - Hip Flexor Stretch with Strap on Table   - 1-2 x daily - 7 x weekly - 3 reps - 30 sec hold - Hooklying Hamstring Stretch with Strap  - 1-2 x daily - 7 x weekly - 3 reps - 30 sec hold - Supine Iliotibial Band Stretch with Strap  - 1-2 x daily - 7 x weekly - 3 reps - 30 sec hold - Seated Table Piriformis Stretch  - 1-2 x daily - 7 x weekly - 3 reps - 30 sec hold - Standing 3-way Hip with Walker  - 1 x daily - 3 x weekly - 2 sets - 10 reps - 3 sec hold - Hip Hiking on Step  - 1 x daily - 3 x weekly - 2 sets - 10 reps - 3 sec hold - Clam with Resistance  - 1 x daily - 3 x weekly - 2 sets - 10 reps - 3-5 sec hold - Seated March with Resistance  - 1 x daily - 3 x weekly - 2 sets - 10 reps - 3 sec hold - Corner Balance Feet Together: Eyes Open With Head Turns  - 1 x daily - 7 x weekly - 2 sets - 5 reps  Standing PWR! Moves   HEP from most recent PT episode: Access Code: [email or web address] URL: [email or web address] Date: 11/28/2022 Prepared by: Glenetta Hew   Exercises - Seated Heel Toe Raises  - 1 x daily - 7 x weekly - 2 sets - 10 reps - 3 sec hold - Standing Isometric Hip Abduction with Ball on Wall  - 1 x daily - 7 x weekly - 2 sets - 10 reps - 3 sec hold - Seated Transversus Abdominis Bracing  - 2 x daily - 7 x weekly - 2 sets - 10 reps - 3-5 sec hold - Seated Isometric Hip Abduction with Resistance  - 1 x daily - 3 x weekly - 2 sets - 10 reps - 3 sec hold - Seated March with Resistance  - 1 x daily - 3 x weekly - 2 sets - 10 reps - 3 sec hold - Seated Shoulder Row with Anchored Resistance  - 1 x daily - 3 x weekly - 2 sets - 10 reps - 3-5 hold hold - Seated Single Arm Shoulder Row with Anchored Resistance  - 1 x daily - 3 x weekly - 2  sets - 10 reps - 3 sec hold - Seated Anti-Rotation Press With Anchored Resistance  - 1 x daily - 3 x weekly - 2 sets - 10 reps - 3 sec hold - Tall Kneeling Hip Hinge  - 1 x daily - 3-4 x weekly - 2 sets - 10 reps - 3 sec hold   Quadruped PWR! Up & Step   ASSESSMENT:  CLINICAL  IMPRESSION: Delonda notes increased fatigue today after several busy days w/o out of town company preventing her from working on her HEP and home walking program but still feels like she is doing better with unsupported static standing balance.  MMT revealing continued gains in overall LE strength with improving overall symmetry but continued hip and ankle weakness still limiting her functionally, esp with balance.  Reviewed HEP to ensure she is still targeting the weaker muscle groups and continued to incorporated balance into strengthening activities today.  Gait speed also improving both with and w/o her hiking pole but increased sway and foot slap more evident when ambulation attempted w/o hiking pole.  Deyonna continues to demonstrate consistent progress with PT and remains motivated to further improve her strength, balance and quality of mobility, therefore will request further insurance authorization from [facility] to allow her to continue with PT.  OBJECTIVE IMPAIRMENTS: Abnormal gait, decreased activity tolerance, decreased balance, decreased coordination, decreased endurance, decreased mobility, difficulty walking, decreased ROM, decreased strength, decreased safety awareness, increased fascial restrictions, impaired perceived functional ability, increased muscle spasms, impaired flexibility, impaired sensation, impaired tone, improper body mechanics, and postural dysfunction.   ACTIVITY LIMITATIONS: carrying, lifting, bending, sitting, standing, squatting, stairs, transfers, bed mobility, bathing, toileting, dressing, reach over head, hygiene/grooming, locomotion level, and caring for others  PARTICIPATION LIMITATIONS: meal prep, cleaning, laundry, driving, shopping, community activity, yard work, and church  PERSONAL FACTORS: Age, Past/current experiences, Time since onset of injury/illness/exacerbation, and 3+ comorbidities: L2-3 XLIF/PSF 05/24/22; C5-7 ACDF 09/21/20; idiopathic progressive  neuropathy; hypthyroidism; DDD; remote h/o back surgery at age 83; h/o scoliosis; Takotsubo cardiomyopathy 01/10/22  are also affecting patient's functional outcome.   REHAB POTENTIAL: Good  CLINICAL DECISION MAKING: Evolving/moderate complexity  EVALUATION COMPLEXITY: Moderate   GOALS: Goals reviewed with patient? Yes  SHORT TERM GOALS: Target date: 08/26/2023  Patient will be independent with initial HEP to improve outcomes and carryover.  Baseline:  Goal status: MET - 08/21/23  2.  Complete balance assessment and update LTG's as indicated.  Baseline:  Goal status: MET - 08/12/23   LONG TERM GOALS: Target date: 09/23/2023, extended to 11/13/2023  Patient will be independent with ongoing/advanced HEP for self-management at home.  Baseline:  Goal status: PARTIALLY MET - 09/11/23 - Met for current HEP; further updates anticipated  2.  Patient to demonstrate ability to achieve and maintain good spinal alignment/posturing and body mechanics needed for daily activities. Baseline:  Goal status: IN PROGRESS - 09/11/23 - Pt aware of good posture and body mechanics but limited at times by impaired balance  3.  Patient will demonstrate functional pain free lumbar ROM to perform ADLs.   Baseline: Refer to above lumbar ROM table Goal status: IN PROGRESS - 09/30/23 - lumbar ROM improving but still mostly limited by balance  4.  Patient will demonstrate improved B proximal LE strength to >/= 4 to 4+/5 for improved stability and ease of mobility . Baseline: Refer to above LE MMT table Goal status: IN PROGRESS - 10/07/23 - continued gains noted in overall B hip strength  5.  Patient  will demonstrate improved B ankle strength to >/= 3+ to 4-/5 for improved gait stability with decreased foot slap.  Baseline: Refer to above LE MMT table Goal status: IN PROGRESS - 10/07/23 - continued gains in overall ankle strength, but remains unable to initiate body weight resisted heel raises  6.  Patient will report  >/= 60% on ABC scale to demonstrate improved balance confidence.  Baseline: 710 / 1600 = 44.4 % Goal status: IN PROGRESS - 09/18/23 - 470 / 1600 = 29.4 %  7. Patient will report </= 36% on Modified Oswestry to demonstrate improved functional ability with decreased pain interference. Baseline: 24 / 50 = 48.0 % Goal status: IN PROGRESS - 09/18/23 - 26 / 50 = 52.0 %  8.  Patient will improve gait velocity to at least 2.62 ft/sec for improved gait efficiency and safety with community ambulation. Baseline: 2.31 ft/sec with single walking/trekking pole on R; 09/18/23 - 2.32 ft/sec with single hiking pole; 2.23 ft/sec w/o AD Goal status: PARTIALLY MET -  10/07/23 - 2.59 ft/sec with single hiking pole, 2.62 ft.sec w/o AD  9.  Patient will improve Berg score to >/= 39/56 (8 points) to improve safety and stability with ADLs in standing and reduce risk for falls.  Baseline: 31/56 Goal status: IN PROGRESS - 09/18/23 - 38/56  10.  Patient will improve FGA score to >/= 15/30 (4 points) to improve gait stability and reduce risk for falls.  Baseline: 11/30 Goal status: MET & REVISED (see 10a. Below) - 09/18/23 - 15/30  10a.  Patient will improve FGA score to >/= 19/30 (baseline as of prior D/C from PT) to improve gait stability and reduce risk for falls.  Baseline: 11/30 on eval, 15/30 as of 09/18/23 Goal status: IN PROGRESS      PLAN:  PT FREQUENCY: 2x/week  PT DURATION: 8 weeks  PLANNED INTERVENTIONS: 97164- PT Re-evaluation, 97110-Therapeutic exercises, 97530- Therapeutic activity, 97112- Neuromuscular re-education, 97535- Self Care, 04540- Manual therapy, 97116- Gait training, 97014- Electrical stimulation (unattended), [phone]- Electrical stimulation (manual), [phone]- Ionotophoresis 4mg /ml Dexamethasone, Patient/Family education, Balance training, Stair training, Taping, Dry Needling, Joint mobilization, DME instructions, Cryotherapy, and Moist heat  PLAN FOR NEXT SESSION: primary focus on balance training  and gait stability; progress lumbopelvic/LE strengthening and core stabilization; review & HEP PRN   Marry Guan, PT 10/07/2023, 4:01 PM

## 2023-10-09 ENCOUNTER — Ambulatory Visit: Payer: Medicare PPO | Admitting: Physical Therapy

## 2023-10-09 ENCOUNTER — Encounter: Payer: Self-pay | Admitting: Physical Therapy

## 2023-10-09 DIAGNOSIS — M21372 Foot drop, left foot: Secondary | ICD-10-CM

## 2023-10-09 DIAGNOSIS — R2689 Other abnormalities of gait and mobility: Secondary | ICD-10-CM

## 2023-10-09 DIAGNOSIS — M6281 Muscle weakness (generalized): Secondary | ICD-10-CM | POA: Diagnosis not present

## 2023-10-09 DIAGNOSIS — M21371 Foot drop, right foot: Secondary | ICD-10-CM

## 2023-10-09 DIAGNOSIS — R2681 Unsteadiness on feet: Secondary | ICD-10-CM

## 2023-10-09 NOTE — Therapy (Addendum)
OUTPATIENT PHYSICAL THERAPY TREATMENT     Patient Name: Saman Giddens MRN: [id number] DOB:11/10/1940, 83 y.o., female Today's Date: 10/09/2023  END OF SESSION:  PT End of Session - 10/09/23 1315     Visit Number 16    Date for PT Re-Evaluation 11/13/23    Authorization Type [employer]    Authorization Time Period pending confirmation #    Authorization - Visit Number 9   Authorization - Number of Visits 15 (7 additional visits added to initial 8)   Progress Note Due on Visit 20    PT Start Time 1315    PT Stop Time 1400    PT Time Calculation (min) 45 min    Activity Tolerance Patient tolerated treatment well    Behavior During Therapy WFL for tasks assessed/performed                         Past Medical History:  Diagnosis Date   High cholesterol    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   She had ACUTE TAKOTSUBO (STRESS) CARDIOMYOPATHY with elevated Troponin Levels - this would be considered "Demand Ischemia - Demand Infarction" & NOT associated with ACS/CAD.     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  - Cath with NO CAD. ==> RESOLVED: f/u Echo 03/2022: EF 60-65%.   Past Surgical History:  Procedure Laterality Date   APPENDECTOMY     34-18 yo   BACK SURGERY     Age 7   CESAREAN SECTION     ECTOPIC PREGNANCY SURGERY     LAPAROSCOPIC HYSTERECTOMY     LEFT HEART CATH AND CORONARY ANGIOGRAPHY N/A 01/09/2022   Procedure: LEFT HEART CATH AND CORONARY ANGIOGRAPHY;  Surgeon: Tonny Bollman, MD;  Location: [facility] INVASIVE CV LAB;  Service: CV:: Widely patent coronaries with mild nonobstructive LAD plaquing.  Right dominant system.  Normal LVEDP.  Based on  clinical presentation, findings are consistent with acute Takotsubo Cardiomyopathy Syndrome   POSTERIOR FUSION LUMBAR SPINE  05/24/2022   [facility], [place]; [name], MD): L2-L3 XLIF, L2-L3 POSTERIOR DECOMPRESSION AND FUSION   TRANSTHORACIC ECHOCARDIOGRAM  01/08/2022   Severely decreased LV function-EF 25 to 30%.  Mid to apical (mostly anterior) with normal basal motion.  GR 2 DD-elevated LAP.  Mildly dilated LA.  Aortic sclerosis with no stenosis.  No AI.  Normal MV with mild to moderate TR.  Mildly elevated RAP, and PAP (estimated 49 mg). If LAD CAD ruled out - consistent with Takotsubo CM Syndrome.   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    PCP: Mosetta Putt, MD   REFERRING PROVIDER: Rondel Oh, NP   REFERRING DIAG: Z98.1 (ICD-10-CM) -  S/p lumbar spinal fusion  Eval and treat for: s/p lumbar fusion, lower extremity strengthening  THERAPY DIAG:  Muscle weakness (generalized)  Other abnormalities of gait and mobility  Unsteadiness on feet  Foot drop, left  Foot drop, right  RATIONALE FOR EVALUATION AND TREATMENT: Rehabilitation  ONSET DATE: 05/24/2022 - L2-L3 XLIF/PSF   NEXT MD VISIT: 12/31/2023 with Marlaine Hind, MD (Surgeon)   SUBJECTIVE:                                                                                                                                                                                                          SUBJECTIVE STATEMENT: Pt reports she slept late today and is just getting started so she feels more unsteady than usual.  PAIN: Are you having pain? No  PERTINENT HISTORY:  L2-3 XLIF/PSF 05/24/22; C5-7 ACDF 09/21/20; idiopathic progressive neuropathy; hypthyroidism; DDD; remote h/o back surgery at age 11; h/o scoliosis; Takotsubo cardiomyopathy 01/10/22   PRECAUTIONS: Fall  RED FLAGS: Bowel or bladder incontinence: Yes: chronic bladder issues, better bowel control since surgery  WEIGHT BEARING RESTRICTIONS: No  FALLS:  Has patient fallen in last 6 months? No  LIVING ENVIRONMENT: Lives with: lives with their spouse Lives in: House/apartment Stairs: Yes: External: 5-6 steps; on left going up Has following equipment at home: Environmental consultant - 2 wheeled, shower chair, and hiking/walking poles  OCCUPATION: Retired Runner, broadcasting/film/video   PLOF: Independent and Leisure: walking ~30 minutes 1x/day (3x around the court near where she lives); sewing/quilting; likes working in the yard but has not been able to do so due to her balance      PATIENT GOALS: "Improved balance (to feel more comfortable in a crowd and walk more normally)."   OBJECTIVE: (objective measures completed at initial evaluation unless otherwise dated)  DIAGNOSTIC FINDINGS:  06/30/23 - XR Lumbar spine: Stable postoperative changes of the lumbar spine with evidence of spinal fusion at L2-L3.   07/01/22 - Lumbar spine x-ray: Expected postoperative appearance related to L2-L3 fixation.    06/12/21 - Lumbar MRI: Postoperative and degenerative changes of the lumbar spine as described, similar compared to July 28, 2020. At L2-L3, there is moderate spinal canal stenosis due to disc bulging, endplate osteophytic spurring, facet and ligamentum flavum hypertrophy. Milder spinal canal stenosis is seen at L3-L4. There is multilevel neural foraminal stenosis.   PATIENT SURVEYS:  ABC scale 710 / 1600 = 44.4 % Modified Oswestry 24 / 50 = 48.0 %  -  Patient reports her answers are based on difficulty performing the activities related to balance  more so than pain  09/18/23: ABC Scale: 470 / 1600 = 29.4 % Modified Oswestry: 26 / 50 = 52.0 % - Patient reports her answers are based on difficulty performing the activities related to balance more so than pain  SCREENING FOR RED FLAGS: Bowel or bladder incontinence: Yes: chronic bladder issues, better bowel control since surgery Spinal tumors: No Cauda equina syndrome: No Compression fracture: No Abdominal aneurysm: No  COGNITION:  Overall cognitive status: Within functional limits for tasks assessed    SENSATION: Light touch: Impaired - top of R foot Proprioception: Impaired    MUSCLE LENGTH: Hamstrings: mild/mod tight L>R ITB: mild tight B Piriformis: mod tight L>R Hip IR: mod tight B Hip flexors: WFL Quads: mild tight R>L Heelcord: mild/mod tight B  POSTURE:  rounded shoulders, forward head, right pelvic obliquity, and scoliosis  LUMBAR ROM: *ROM assessment limited in standing due to balance impairments  Active  Eval * 09/30/23 *  Flexion Hands to mid shin Hands to mid shin  Extension 50% limited 25% limited  Right lateral flexion Hand to mid thigh Hand to lateral knee  Left lateral flexion Hand to mid thigh Hand to lateral knee - pulling in R flank  Right rotation 50% limited 30% limited  Left rotation 50% limited 30% limited  (Blank rows = not tested)  LOWER EXTREMITY ROM:    Grossly WFL other than limitations indicated above due to muscle tightness  LOWER EXTREMITY MMT:    MMT Right eval Left eval R 09/16/23 L 09/16/23 R 10/07/23 L 10/07/23  Hip flexion 3 3 4- 4- 4- 4  Hip extension 4 4 4+ 4+ 5 5  Hip abduction 4- 4- 4- 4 4 4+  Hip adduction 3+ 3+ 3+ 3+ 3+ 4-  Hip internal rotation 4- 3+ 4+ 4 4+ 4+  Hip external rotation 3 3 4- 3+ 4- 4-  Knee flexion 5 5 5 5 5 5   Knee extension 4+ 4+ 4+ 4+ 5 5  Ankle dorsiflexion 3 3- 3+ 3- 4- 3  Ankle plantarflexion 2 2 2+ 2+  2+ 4 in NWB 2+ 4 in NWB  Ankle inversion 3 3- 3+ 3- 3+ 3-  Ankle eversion 4- 3+ 4 4- 4+ 4   (Blank rows = not tested)  FUNCTIONAL TESTS:  5 times sit to stand: 11.50 sec Timed up and go (TUG): 14.00 sec with single hiking pole; 12.25 sec w/o AD - 08/12/23 10 meter walk test: 14.22 sec with single walking/trekking pole; Gait speed = 2.31 ft/sec Berg Balance Scale: 31/56; < 36 high risk for falls (close to 100%) - 08/12/23 Functional gait assessment: 11/30; < 19 = high risk fall - 08/12/23   09/18/23 = 14.12 sec with single hiking pole; 14.69 sec w/o AD Gait speed = 2.32 ft/sec with single hiking pole; 2.23 ft/sec w/o AD Berg = 38/56, 37-45 significant risk for falls (>80%)  FGA = 15/30, < 19 = high risk fall   Standardized testing results as of discharge from latest PT episode (12/31/22): 5xSTS: 10.72 sec Berg: 48/56; 46-51 moderate risk for falls (>50%)  : 16.15 sec w/o AD Gait speed: 2.03 ft/sec FGA: 19/30; 19-24 = medium risk fall   GAIT: Distance walked: Clinic distances Assistive device utilized:  Single walking/trekking pole on R Level of assistance: SBA Gait pattern:  Increased sway, step through pattern, decreased stride length, trendelenburg, lateral hip instability, poor foot clearance- Right, and poor foot clearance- Left Comments: L>R foot slap   TODAY'S TREATMENT:  10/09/23 NEUROMUSCULAR RE-EDUCATION: To improve balance, proprioception, coordination, and reduce fall risk.  SLS + opp LE 5-way star taps to colored dots x 5 each side Alt toe clears to 9" stool x 10 Lateral B  step-over 2 x 10 with TRX support - 1st set over yardstick, 2nd set over 1/2 FR Lateral B cross-over step with TRX support x 10  THERAPEUTIC EXERCISE: to improve flexibility, strength and mobility.  Demonstration, verbal and tactile cues throughout for technique.  Standing hip flexion + abduction + ER arc step-outs x 10 bil, single UE support on back of chair for balance Fwd step-ups  to 6" step x 10 bil, single UE support on window ledge Lateral step-ups to 6" step x 10 bil, B UE support on window ledge   10/07/23  THERAPEUTIC EXERCISE: to improve flexibility, strength and mobility.  Verbal and tactile cues throughout for technique.  Rec bike - L4 x 3 min, L3 x 3 min (seat #2) Fitter hip ABD (2 blue) x 15 Fitter hip extension (1 black) x 15 Side sitting figure 4 ankle inversion x 5 bil - limited ROM Seated B ankle inversion isometric into small ball 10 x 5"   THERAPEUTIC ACTIVITIES: LE MMT :  12.66 sec with single hiking pole, 12.53 sec w/o AD Gait speed:  2.59 ft/sec with single hiking pole, 2.62 ft.sec w/o AD   10/02/23  THERAPEUTIC EXERCISE: to improve flexibility, strength and mobility.  Verbal and tactile cues throughout for technique.  Rec bike - L4 x 6 min (seat #2) SLS + 3-way hip swing x 10 bil, single UE support on back of chair for balance - R SLS on 2" block for L foot clearance SLS + 3-way hip semi-circle arc x 10 bil, single UE support on back of chair for balance - R SLS on 2" block for L foot clearance  NEUROMUSCULAR RE-EDUCATION: To improve posture, balance, proprioception, coordination, reduce fall risk, and amplitude of movement. SBA/CGA of PT for all activities. Bean bag reach & toss - 2 x 11 bean bags, 1 set each from each side with bags held at various heights and distances as pt alternating R & L hand reaches - initial reaching and tosses from standing on firm surface, progressing to standing on Airex pad Figure 8 walking forward around 2 cones placed 6" apart  Figure 8 walking side-ways/diagonal around 2 cones placed 6" apart - multiple minor LOB requiring PT intervention to stabilize   PATIENT EDUCATION:  Education details: continue with current HEP Person educated: Patient Education method: Explanation Education comprehension: verbalized understanding  HOME EXERCISE PROGRAM: Access Code: [email or web address] URL:  [email or web address] Date: 08/26/2023 Prepared by: Glenetta Hew  Exercises - Standing Gastroc Stretch at Counter  - 1-2 x daily - 7 x weekly - 3 reps - 30 sec hold - Standing Gastroc Stretch on Foam 1/2 Roll  - 1-2 x daily - 7 x weekly - 3 reps - 30 sec hold - Standing Hip Flexor Stretch  - 1-2 x daily - 7 x weekly - 3 reps - 30 sec hold - Hip Flexor Stretch with Strap on Table  - 1-2 x daily - 7 x weekly - 3 reps - 30 sec hold - Hooklying Hamstring Stretch with Strap  - 1-2 x daily - 7 x weekly - 3 reps - 30 sec hold - Supine Iliotibial Band Stretch with Strap  - 1-2 x daily - 7 x weekly - 3 reps - 30 sec hold - Seated Table Piriformis  Stretch  - 1-2 x daily - 7 x weekly - 3 reps - 30 sec hold - Standing 3-way Hip with Walker  - 1 x daily - 3 x weekly - 2 sets - 10 reps - 3 sec hold - Hip Hiking on Step  - 1 x daily - 3 x weekly - 2 sets - 10 reps - 3 sec hold - Clam with Resistance  - 1 x daily - 3 x weekly - 2 sets - 10 reps - 3-5 sec hold - Seated March with Resistance  - 1 x daily - 3 x weekly - 2 sets - 10 reps - 3 sec hold - Corner Balance Feet Together: Eyes Open With Head Turns  - 1 x daily - 7 x weekly - 2 sets - 5 reps  Standing PWR! Moves   HEP from most recent PT episode: Access Code: [email or web address] URL: [email or web address] Date: 11/28/2022 Prepared by: Glenetta Hew   Exercises - Seated Heel Toe Raises  - 1 x daily - 7 x weekly - 2 sets - 10 reps - 3 sec hold - Standing Isometric Hip Abduction with Ball on Wall  - 1 x daily - 7 x weekly - 2 sets - 10 reps - 3 sec hold - Seated Transversus Abdominis Bracing  - 2 x daily - 7 x weekly - 2 sets - 10 reps - 3-5 sec hold - Seated Isometric Hip Abduction with Resistance  - 1 x daily - 3 x weekly - 2 sets - 10 reps - 3 sec hold - Seated March with Resistance  - 1 x daily - 3 x weekly - 2 sets - 10 reps - 3 sec hold - Seated Shoulder Row with Anchored Resistance  - 1 x daily - 3 x weekly - 2 sets - 10  reps - 3-5 hold hold - Seated Single Arm Shoulder Row with Anchored Resistance  - 1 x daily - 3 x weekly - 2 sets - 10 reps - 3 sec hold - Seated Anti-Rotation Press With Anchored Resistance  - 1 x daily - 3 x weekly - 2 sets - 10 reps - 3 sec hold - Tall Kneeling Hip Hinge  - 1 x daily - 3-4 x weekly - 2 sets - 10 reps - 3 sec hold   Quadruped PWR! Up & Step   ASSESSMENT:  CLINICAL IMPRESSION: Shylyn reports increased unsteadiness today, which she says may be related to being up later than usual last night and sleeping in until 11:45 this morning.  She was having more difficulty with unsupported static standing therefore shifted emphasis to working on better control and coordination with dynamic standing activities as well as continuing to focus on proximal LE strengthening.  With lateral stepping activities she tends to overstep, stepping on her own shoe of the opposite foot when bringing feet back together both with step-ups and stepping over obstacles and thus creating increased risk for loss of balance.  She also fatigued more quickly today requiring more frequent seated rest breaks.  Luree remains motivated to further improve her strength, balance and quality of mobility, and will benefit from continued skilled PT to address ongoing weakness and balance deficits to improve mobility and activity tolerance with improved stability and balance to reduce fall risk.    OBJECTIVE IMPAIRMENTS: Abnormal gait, decreased activity tolerance, decreased balance, decreased coordination, decreased endurance, decreased mobility, difficulty walking, decreased ROM, decreased strength, decreased safety awareness, increased fascial restrictions, impaired perceived functional ability, increased muscle spasms,  impaired flexibility, impaired sensation, impaired tone, improper body mechanics, and postural dysfunction.   ACTIVITY LIMITATIONS: carrying, lifting, bending, sitting, standing, squatting, stairs, transfers,  bed mobility, bathing, toileting, dressing, reach over head, hygiene/grooming, locomotion level, and caring for others  PARTICIPATION LIMITATIONS: meal prep, cleaning, laundry, driving, shopping, community activity, yard work, and church  PERSONAL FACTORS: Age, Past/current experiences, Time since onset of injury/illness/exacerbation, and 3+ comorbidities: L2-3 XLIF/PSF 05/24/22; C5-7 ACDF 09/21/20; idiopathic progressive neuropathy; hypthyroidism; DDD; remote h/o back surgery at age 41; h/o scoliosis; Takotsubo cardiomyopathy 01/10/22  are also affecting patient's functional outcome.   REHAB POTENTIAL: Good  CLINICAL DECISION MAKING: Evolving/moderate complexity  EVALUATION COMPLEXITY: Moderate   GOALS: Goals reviewed with patient? Yes  SHORT TERM GOALS: Target date: 08/26/2023  Patient will be independent with initial HEP to improve outcomes and carryover.  Baseline:  Goal status: MET - 08/21/23  2.  Complete balance assessment and update LTG's as indicated.  Baseline:  Goal status: MET - 08/12/23   LONG TERM GOALS: Target date: 09/23/2023, extended to 11/13/2023  Patient will be independent with ongoing/advanced HEP for self-management at home.  Baseline:  Goal status: PARTIALLY MET - 09/11/23 - Met for current HEP; further updates anticipated  2.  Patient to demonstrate ability to achieve and maintain good spinal alignment/posturing and body mechanics needed for daily activities. Baseline:  Goal status: IN PROGRESS - 09/11/23 - Pt aware of good posture and body mechanics but limited at times by impaired balance  3.  Patient will demonstrate functional pain free lumbar ROM to perform ADLs.   Baseline: Refer to above lumbar ROM table Goal status: IN PROGRESS - 09/30/23 - lumbar ROM improving but still mostly limited by balance  4.  Patient will demonstrate improved B proximal LE strength to >/= 4 to 4+/5 for improved stability and ease of mobility . Baseline: Refer to above LE MMT  table Goal status: IN PROGRESS - 10/07/23 - continued gains noted in overall B hip strength  5.  Patient will demonstrate improved B ankle strength to >/= 3+ to 4-/5 for improved gait stability with decreased foot slap.  Baseline: Refer to above LE MMT table Goal status: IN PROGRESS - 10/07/23 - continued gains in overall ankle strength, but remains unable to initiate body weight resisted heel raises  6.  Patient will report >/= 60% on ABC scale to demonstrate improved balance confidence.  Baseline: 710 / 1600 = 44.4 % Goal status: IN PROGRESS - 09/18/23 - 470 / 1600 = 29.4 %  7. Patient will report </= 36% on Modified Oswestry to demonstrate improved functional ability with decreased pain interference. Baseline: 24 / 50 = 48.0 % Goal status: IN PROGRESS - 09/18/23 - 26 / 50 = 52.0 %  8.  Patient will improve gait velocity to at least 2.62 ft/sec for improved gait efficiency and safety with community ambulation. Baseline: 2.31 ft/sec with single walking/trekking pole on R; 09/18/23 - 2.32 ft/sec with single hiking pole; 2.23 ft/sec w/o AD Goal status: PARTIALLY MET -  10/07/23 - 2.59 ft/sec with single hiking pole, 2.62 ft.sec w/o AD  9.  Patient will improve Berg score to >/= 39/56 (8 points) to improve safety and stability with ADLs in standing and reduce risk for falls.  Baseline: 31/56 Goal status: IN PROGRESS - 09/18/23 - 38/56  10.  Patient will improve FGA score to >/= 15/30 (4 points) to improve gait stability and reduce risk for falls.  Baseline: 11/30 Goal status: MET & REVISED (see  10a. Below) - 09/18/23 - 15/30  10a.  Patient will improve FGA score to >/= 19/30 (baseline as of prior D/C from PT) to improve gait stability and reduce risk for falls.  Baseline: 11/30 on eval, 15/30 as of 09/18/23 Goal status: IN PROGRESS      PLAN:  PT FREQUENCY: 2x/week  PT DURATION: 8 weeks  PLANNED INTERVENTIONS: 97164- PT Re-evaluation, 97110-Therapeutic exercises, 97530- Therapeutic activity,  97112- Neuromuscular re-education, 97535- Self Care, 16109- Manual therapy, 97116- Gait training, 97014- Electrical stimulation (unattended), [phone]- Electrical stimulation (manual), [phone]- Ionotophoresis 4mg /ml Dexamethasone, Patient/Family education, Balance training, Stair training, Taping, Dry Needling, Joint mobilization, DME instructions, Cryotherapy, and Moist heat  PLAN FOR NEXT SESSION: primary focus on balance training and gait stability; progress lumbopelvic/LE strengthening and core stabilization; review & HEP PRN   Marry Guan, PT 10/09/2023, 7:27 PM

## 2023-10-14 ENCOUNTER — Encounter: Payer: Self-pay | Admitting: Physical Therapy

## 2023-10-14 ENCOUNTER — Ambulatory Visit: Payer: Medicare PPO | Attending: Registered" | Admitting: Physical Therapy

## 2023-10-14 DIAGNOSIS — M6281 Muscle weakness (generalized): Secondary | ICD-10-CM | POA: Diagnosis present

## 2023-10-14 DIAGNOSIS — R2681 Unsteadiness on feet: Secondary | ICD-10-CM | POA: Diagnosis present

## 2023-10-14 DIAGNOSIS — M21372 Foot drop, left foot: Secondary | ICD-10-CM | POA: Diagnosis present

## 2023-10-14 DIAGNOSIS — R2689 Other abnormalities of gait and mobility: Secondary | ICD-10-CM | POA: Diagnosis present

## 2023-10-14 DIAGNOSIS — M21371 Foot drop, right foot: Secondary | ICD-10-CM | POA: Insufficient documentation

## 2023-10-14 NOTE — Therapy (Signed)
 OUTPATIENT PHYSICAL THERAPY TREATMENT     Patient Name: Estle Sabella MRN: [id number] DOB:[birth year], 83 y.o., female Today's Date: 10/14/2023  END OF SESSION:  PT End of Session - 10/14/23 1316     Visit Number 17    Date for PT Re-Evaluation 11/13/23    Authorization Type [employer]    Authorization Time Period 09/11/23 - 11/13/23    Authorization - Visit Number 10    Authorization - Number of Visits 15   7 additional visits added to initial 8   Progress Note Due on Visit 20    PT Start Time 1316    PT Stop Time 1400    PT Time Calculation (min) 44 min    Activity Tolerance Patient tolerated treatment well    Behavior During Therapy [facility] for tasks assessed/performed                          Past Medical History:  Diagnosis Date   High cholesterol    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   She had ACUTE TAKOTSUBO (STRESS) CARDIOMYOPATHY with elevated Troponin Levels - this would be considered Demand Ischemia - Demand Infarction & NOT associated with ACS/CAD.     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  - Cath with NO CAD. ==> RESOLVED: f/u Echo 03/2022: EF 60-65%.   Past Surgical History:  Procedure Laterality Date   APPENDECTOMY     35-18 yo   BACK SURGERY     Age 23   CESAREAN SECTION     ECTOPIC PREGNANCY SURGERY     LAPAROSCOPIC HYSTERECTOMY     LEFT HEART CATH AND CORONARY ANGIOGRAPHY N/A 01/09/2022   Procedure: LEFT HEART CATH AND CORONARY ANGIOGRAPHY;  Surgeon: Wonda Sharper, MD;  Location: [facility] INVASIVE CV LAB;  Service: CV:: Widely patent coronaries with mild nonobstructive LAD plaquing.  Right dominant system.  Normal LVEDP.  Based on clinical  presentation, findings are consistent with acute Takotsubo Cardiomyopathy Syndrome   POSTERIOR FUSION LUMBAR SPINE  05/24/2022   [facility], [place]; [name], MD): L2-L3 XLIF, L2-L3 POSTERIOR DECOMPRESSION AND FUSION   TRANSTHORACIC ECHOCARDIOGRAM  01/08/2022   Severely decreased LV function-EF 25 to 30%.  Mid to apical (mostly anterior) with normal basal motion.  GR 2 DD-elevated LAP.  Mildly dilated LA.  Aortic sclerosis with no stenosis.  No AI.  Normal MV with mild to moderate TR.  Mildly elevated RAP, and PAP (estimated 49 mg). If LAD CAD ruled out - consistent with Takotsubo CM Syndrome.   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    PCP: Windy Coy, MD   REFERRING PROVIDER: Tobie Raisin, NP  REFERRING DIAG: Z98.1 (ICD-10-CM) - S/p lumbar spinal fusion  Eval and treat for: s/p lumbar fusion, lower extremity strengthening  THERAPY DIAG:  Muscle weakness (generalized)  Other abnormalities of gait and mobility  Unsteadiness on feet  Foot drop, left  Foot drop, right  RATIONALE FOR EVALUATION AND TREATMENT: Rehabilitation  ONSET DATE: 05/24/2022 - L2-L3 XLIF/PSF   NEXT MD VISIT: 12/31/2023 with Norleen [name], MD (Surgeon)   SUBJECTIVE:                                                                                                                                                                                                          SUBJECTIVE STATEMENT: Drue feels like she is noting the benefit from PT - she feels more flexible and her strength is improving.  PAIN: Are you having pain? No  PERTINENT HISTORY:  L2-3 XLIF/PSF 05/24/22; C5-7 ACDF 09/21/20; idiopathic progressive neuropathy; hypthyroidism; DDD; remote h/o back surgery at age 63; h/o scoliosis; Takotsubo cardiomyopathy 01/10/22   PRECAUTIONS: Fall  RED FLAGS: Bowel or bladder incontinence: Yes: chronic bladder issues, better bowel control since surgery  WEIGHT BEARING RESTRICTIONS: No  FALLS:  Has patient fallen in last 6 months? No  LIVING ENVIRONMENT: Lives with: lives with their spouse Lives in: House/apartment Stairs: Yes: External: 5-6 steps; on left going up Has following equipment at home: Walker - 2 wheeled, shower chair, and hiking/walking poles  OCCUPATION: Retired runner, broadcasting/film/video   PLOF: Independent and Leisure: walking ~30 minutes 1x/day (3x around the court near where she lives); sewing/quilting; likes working in the yard but has not been able to do so due to her balance      PATIENT GOALS: Improved balance (to feel more comfortable in a crowd and walk more normally).   OBJECTIVE: (objective measures completed at initial evaluation unless otherwise dated)  DIAGNOSTIC FINDINGS:  06/30/23 - XR Lumbar spine: Stable postoperative changes of the lumbar spine with evidence of spinal fusion at L2-L3.   07/01/22 - Lumbar spine x-ray: Expected postoperative appearance related to L2-L3 fixation.    06/12/21 - Lumbar MRI: Postoperative and degenerative changes of the lumbar spine as described, similar compared to July 28, 2020. At L2-L3, there is moderate spinal canal stenosis due to disc bulging, endplate osteophytic spurring, facet and ligamentum flavum hypertrophy. Milder spinal canal stenosis is seen at L3-L4. There is multilevel neural foraminal stenosis.   PATIENT SURVEYS:  ABC scale 710 / 1600 = 44.4 % Modified Oswestry 24 / 50 =  48.0 %  - Patient reports her answers are based on  difficulty performing the activities related to balance more so than pain  09/18/23: ABC Scale: 470 / 1600 = 29.4 % Modified Oswestry: 26 / 50 = 52.0 % - Patient reports her answers are based on difficulty performing the activities related to balance more so than pain  SCREENING FOR RED FLAGS: Bowel or bladder incontinence: Yes: chronic bladder issues, better bowel control since surgery Spinal tumors: No Cauda equina syndrome: No Compression fracture: No Abdominal aneurysm: No  COGNITION:  Overall cognitive status: Within functional limits for tasks assessed    SENSATION: Light touch: Impaired - top of R foot Proprioception: Impaired    MUSCLE LENGTH: Hamstrings: mild/mod tight L>R ITB: mild tight B Piriformis: mod tight L>R Hip IR: mod tight B Hip flexors: WFL Quads: mild tight R>L Heelcord: mild/mod tight B  POSTURE:  rounded shoulders, forward head, right pelvic obliquity, and scoliosis  LUMBAR ROM: *ROM assessment limited in standing due to balance impairments  Active  Eval * 09/30/23 *  Flexion Hands to mid shin Hands to mid shin  Extension 50% limited 25% limited  Right lateral flexion Hand to mid thigh Hand to lateral knee  Left lateral flexion Hand to mid thigh Hand to lateral knee - pulling in R flank  Right rotation 50% limited 30% limited  Left rotation 50% limited 30% limited  (Blank rows = not tested)  LOWER EXTREMITY ROM:    Grossly WFL other than limitations indicated above due to muscle tightness  LOWER EXTREMITY MMT:    MMT Right eval Left eval R 09/16/23 L 09/16/23 R 10/07/23 L 10/07/23  Hip flexion 3 3 4- 4- 4- 4  Hip extension 4 4 4+ 4+ 5 5  Hip abduction 4- 4- 4- 4 4 4+  Hip adduction 3+ 3+ 3+ 3+ 3+ 4-  Hip internal rotation 4- 3+ 4+ 4 4+ 4+  Hip external rotation 3 3 4- 3+ 4- 4-  Knee flexion 5 5 5 5 5 5   Knee extension 4+ 4+ 4+ 4+ 5 5  Ankle dorsiflexion 3 3- 3+ 3- 4- 3  Ankle plantarflexion 2 2  2+ 2+ 2+ 4 in NWB 2+ 4 in NWB  Ankle inversion 3 3- 3+ 3- 3+ 3-  Ankle eversion 4- 3+ 4 4- 4+ 4   (Blank rows = not tested)  FUNCTIONAL TESTS:  5 times sit to stand: 11.50 sec Timed up and go (TUG): 14.00 sec with single hiking pole; 12.25 sec w/o AD - 08/12/23 10 meter walk test: 14.22 sec with single walking/trekking pole; Gait speed = 2.31 ft/sec Berg Balance Scale: 31/56; < 36 high risk for falls (close to 100%) - 08/12/23 Functional gait assessment: 11/30; < 19 = high risk fall - 08/12/23   09/18/23 = 14.12 sec with single hiking pole; 14.69 sec w/o AD Gait speed = 2.32 ft/sec with single hiking pole; 2.23 ft/sec w/o AD Berg = 38/56, 37-45 significant risk for falls (>80%)  FGA = 15/30, < 19 = high risk fall   Standardized testing results as of discharge from latest PT episode (12/31/22): 5xSTS: 10.72 sec Berg: 48/56; 46-51 moderate risk for falls (>50%)  : 16.15 sec w/o AD Gait speed: 2.03 ft/sec FGA: 19/30; 19-24 = medium risk fall   GAIT: Distance walked: Clinic distances Assistive device utilized:  Single walking/trekking pole on R Level of assistance: SBA Gait pattern:  Increased sway, step through pattern, decreased stride length, trendelenburg, lateral hip instability, poor foot clearance- Right, and poor foot clearance- Left Comments: L>R  foot slap   TODAY'S TREATMENT:   10/14/23  THERAPEUTIC EXERCISE: To improve strength and endurance.  Demonstration, verbal and tactile cues throughout for technique.  Rec bike - L4 x 3 min, L3 x 3 min (seat #2) Seated B biceps curls 2# x 10 Bent over seated B triceps extension 2# x 10 Seated B shoulder OH press 2# x 10  NEUROMUSCULAR RE-EDUCATION: To improve balance, coordination, kinesthetic sense, posture, proprioception, and reduce fall risk. SBA to CGA of PT for all activities for safety. B side-stepping along counter - looped YTB at thighs 1 x 12 ft, at calves 1 x 12 ft, at ankles 2 x 12 ft Fwd/back monster walk  with looped YTB at ankles 3 x 12 ft Standing march with looped YTB at midfeet 2 x 10 - cues for ankle DF during hip flexion to encourage increased toe clearance/reduce foot slap (and prevent band from slipping off) B braiding & carioca/grapevine 2 x 12 ft each along counter - cues for L hip hike to facilitate L hip ABD/ADD during steps   10/09/23 NEUROMUSCULAR RE-EDUCATION: To improve balance, proprioception, coordination, and reduce fall risk.  SLS + opp LE 5-way star taps to colored dots x 5 each side Alt toe clears to 9 stool x 10 Lateral B  step-over 2 x 10 with TRX support - 1st set over yardstick, 2nd set over 1/2 FR Lateral B cross-over step with TRX support x 10  THERAPEUTIC EXERCISE: to improve flexibility, strength and mobility.  Demonstration, verbal and tactile cues throughout for technique.  Standing hip flexion + abduction + ER arc step-outs x 10 bil, single UE support on back of chair for balance Fwd step-ups to 6 step x 10 bil, single UE support on window ledge Lateral step-ups to 6 step x 10 bil, B UE support on window ledge   10/07/23  THERAPEUTIC EXERCISE: to improve flexibility, strength and mobility.  Verbal and tactile cues throughout for technique.  Rec bike - L4 x 3 min, L3 x 3 min (seat #2) Fitter hip ABD (2 blue) x 15 Fitter hip extension (1 black) x 15 Side sitting figure 4 ankle inversion x 5 bil - limited ROM Seated B ankle inversion isometric into small ball 10 x 5   THERAPEUTIC ACTIVITIES: LE MMT :  12.66 sec with single hiking pole, 12.53 sec w/o AD Gait speed:  2.59 ft/sec with single hiking pole, 2.62 ft.sec w/o AD   PATIENT EDUCATION:  Education details: HEP update - resisted stepping activities and carioca with counter support Person educated: Patient Education method: Programmer, Multimedia, Demonstration, Verbal cues, and Handouts Education comprehension: verbalized understanding, returned demonstration, verbal cues required, and needs further  education  HOME EXERCISE PROGRAM: Access Code: [email or web address] URL: [email or web address] Date: 10/14/2023 Prepared by: Elijah Hidden  Exercises - Standing Gastroc Stretch at Counter  - 1-2 x daily - 7 x weekly - 3 reps - 30 sec hold - Standing Gastroc Stretch on Foam 1/2 Roll  - 1-2 x daily - 7 x weekly - 3 reps - 30 sec hold - Standing Hip Flexor Stretch  - 1-2 x daily - 7 x weekly - 3 reps - 30 sec hold - Hip Flexor Stretch with Strap on Table  - 1-2 x daily - 7 x weekly - 3 reps - 30 sec hold - Hooklying Hamstring Stretch with Strap  - 1-2 x daily - 7 x weekly - 3 reps - 30 sec hold - Supine Iliotibial Band Stretch with Strap  -  1-2 x daily - 7 x weekly - 3 reps - 30 sec hold - Seated Table Piriformis Stretch  - 1-2 x daily - 7 x weekly - 3 reps - 30 sec hold - Standing 3-way Hip with Walker  - 1 x daily - 3 x weekly - 2 sets - 10 reps - 3 sec hold - Hip Hiking on Step  - 1 x daily - 3 x weekly - 2 sets - 10 reps - 3 sec hold - Clam with Resistance  - 1 x daily - 3 x weekly - 2 sets - 10 reps - 3-5 sec hold - Seated March with Resistance  - 1 x daily - 3 x weekly - 2 sets - 10 reps - 3 sec hold - Corner Balance Feet Together: Eyes Open With Head Turns  - 1 x daily - 7 x weekly - 2 sets - 5 reps - Carioca with Counter Support  - 1 x daily - 3 x weekly - 2 sets - 10 reps - 3 sec hold - Side Stepping with Resistance at Thighs and Counter Support  - 1 x daily - 3 x weekly - 2 sets - 10 reps - 3 sec hold - Forward Backward Monster Walk with Band at Thighs and Counter Support  - 1 x daily - 3 x weekly - 2 sets - 10 reps  Standing PWR! Moves   HEP from most recent PT episode: Access Code: [email or web address] URL: [email or web address] Date: 11/28/2022 Prepared by: Elijah Hidden   Exercises - Seated Heel Toe Raises  - 1 x daily - 7 x weekly - 2 sets - 10 reps - 3 sec hold - Standing Isometric Hip Abduction with Ball on Wall  - 1 x daily - 7 x weekly - 2 sets - 10 reps - 3 sec  hold - Seated Transversus Abdominis Bracing  - 2 x daily - 7 x weekly - 2 sets - 10 reps - 3-5 sec hold - Seated Isometric Hip Abduction with Resistance  - 1 x daily - 3 x weekly - 2 sets - 10 reps - 3 sec hold - Seated March with Resistance  - 1 x daily - 3 x weekly - 2 sets - 10 reps - 3 sec hold - Seated Shoulder Row with Anchored Resistance  - 1 x daily - 3 x weekly - 2 sets - 10 reps - 3-5 hold hold - Seated Single Arm Shoulder Row with Anchored Resistance  - 1 x daily - 3 x weekly - 2 sets - 10 reps - 3 sec hold - Seated Anti-Rotation Press With Anchored Resistance  - 1 x daily - 3 x weekly - 2 sets - 10 reps - 3 sec hold - Tall Kneeling Hip Hinge  - 1 x daily - 3-4 x weekly - 2 sets - 10 reps - 3 sec hold   Quadruped PWR! Up & Step   ASSESSMENT:  CLINICAL IMPRESSION: Marcelina reports perceived improvement in her flexibility and strength with decreased abnormal muscle tension.  She notes continued feeling of instability with lateral stepping, therefore continued to work on strengthening and NMR focusing on lateral stability and safety with lateral movements to reduce fall risk. Increased difficulty noted with ability to maintain L hip elevation/hike for advancement of L LE in hip ABD/ADD during braiding and carioca with some improvement noted when cued/facilitated for R glute medius activation for L hip hike.  Brief instruction in use of 2# weight for UE strengthening provided  at pt request as she feels that she is losing upper body strength as a result of decreased ability to lift and carry things due to her balance limitations.  Valynn remains motivated to further improve her strength, balance and quality of mobility, and will benefit from continued skilled PT to address ongoing weakness and balance deficits to improve mobility and activity tolerance with improved stability and balance to reduce fall risk.    OBJECTIVE IMPAIRMENTS: Abnormal gait, decreased activity tolerance, decreased  balance, decreased coordination, decreased endurance, decreased mobility, difficulty walking, decreased ROM, decreased strength, decreased safety awareness, increased fascial restrictions, impaired perceived functional ability, increased muscle spasms, impaired flexibility, impaired sensation, impaired tone, improper body mechanics, and postural dysfunction.   ACTIVITY LIMITATIONS: carrying, lifting, bending, sitting, standing, squatting, stairs, transfers, bed mobility, bathing, toileting, dressing, reach over head, hygiene/grooming, locomotion level, and caring for others  PARTICIPATION LIMITATIONS: meal prep, cleaning, laundry, driving, shopping, community activity, yard work, and church  PERSONAL FACTORS: Age, Past/current experiences, Time since onset of injury/illness/exacerbation, and 3+ comorbidities: L2-3 XLIF/PSF 05/24/22; C5-7 ACDF 09/21/20; idiopathic progressive neuropathy; hypthyroidism; DDD; remote h/o back surgery at age 59; h/o scoliosis; Takotsubo cardiomyopathy 01/10/22  are also affecting patient's functional outcome.   REHAB POTENTIAL: Good  CLINICAL DECISION MAKING: Evolving/moderate complexity  EVALUATION COMPLEXITY: Moderate   GOALS: Goals reviewed with patient? Yes  SHORT TERM GOALS: Target date: 08/26/2023  Patient will be independent with initial HEP to improve outcomes and carryover.  Baseline:  Goal status: MET - 08/21/23  2.  Complete balance assessment and update LTG's as indicated.  Baseline:  Goal status: MET - 08/12/23   LONG TERM GOALS: Target date: 09/23/2023, extended to 11/13/2023  Patient will be independent with ongoing/advanced HEP for self-management at home.  Baseline:  Goal status: PARTIALLY MET - 09/11/23 - Met for current HEP; further updates anticipated  2.  Patient to demonstrate ability to achieve and maintain good spinal alignment/posturing and body mechanics needed for daily activities. Baseline:  Goal status: IN PROGRESS - 09/11/23 - Pt  aware of good posture and body mechanics but limited at times by impaired balance  3.  Patient will demonstrate functional pain free lumbar ROM to perform ADLs.   Baseline: Refer to above lumbar ROM table Goal status: IN PROGRESS - 09/30/23 - lumbar ROM improving but still mostly limited by balance  4.  Patient will demonstrate improved B proximal LE strength to >/= 4 to 4+/5 for improved stability and ease of mobility . Baseline: Refer to above LE MMT table Goal status: IN PROGRESS - 10/07/23 - continued gains noted in overall B hip strength  5.  Patient will demonstrate improved B ankle strength to >/= 3+ to 4-/5 for improved gait stability with decreased foot slap.  Baseline: Refer to above LE MMT table Goal status: IN PROGRESS - 10/07/23 - continued gains in overall ankle strength, but remains unable to initiate body weight resisted heel raises  6.  Patient will report >/= 60% on ABC scale to demonstrate improved balance confidence.  Baseline: 710 / 1600 = 44.4 % Goal status: IN PROGRESS - 09/18/23 - 470 / 1600 = 29.4 %  7. Patient will report </= 36% on Modified Oswestry to demonstrate improved functional ability with decreased pain interference. Baseline: 24 / 50 = 48.0 % Goal status: IN PROGRESS - 09/18/23 - 26 / 50 = 52.0 %  8.  Patient will improve gait velocity to at least 2.62 ft/sec for improved gait efficiency and safety with community  ambulation. Baseline: 2.31 ft/sec with single walking/trekking pole on R; 09/18/23 - 2.32 ft/sec with single hiking pole; 2.23 ft/sec w/o AD Goal status: PARTIALLY MET -  10/07/23 - 2.59 ft/sec with single hiking pole, 2.62 ft.sec w/o AD  9.  Patient will improve Berg score to >/= 39/56 (8 points) to improve safety and stability with ADLs in standing and reduce risk for falls.  Baseline: 31/56 Goal status: IN PROGRESS - 09/18/23 - 38/56  10.  Patient will improve FGA score to >/= 15/30 (4 points) to improve gait stability and reduce risk for falls.   Baseline: 11/30 Goal status: MET & REVISED (see 10a. Below) - 09/18/23 - 15/30  10a.  Patient will improve FGA score to >/= 19/30 (baseline as of prior D/C from PT) to improve gait stability and reduce risk for falls.  Baseline: 11/30 on eval, 15/30 as of 09/18/23 Goal status: IN PROGRESS      PLAN:  PT FREQUENCY: 2x/week  PT DURATION: 8 weeks  PLANNED INTERVENTIONS: 97164- PT Re-evaluation, 97110-Therapeutic exercises, 97530- Therapeutic activity, 97112- Neuromuscular re-education, 97535- Self Care, [id number]- Manual therapy, 97116- Gait training, 97014- Electrical stimulation (unattended), [phone]- Electrical stimulation (manual), [phone]- Ionotophoresis 4mg /ml Dexamethasone, Patient/Family education, Balance training, Stair training, Taping, Dry Needling, Joint mobilization, DME instructions, Cryotherapy, and Moist heat  PLAN FOR NEXT SESSION: reassess Berg and/or FGA; primary focus on balance training and gait stability; progress lumbopelvic/LE strengthening and core stabilization; review & HEP PRN   Elijah CHRISTELLA Hidden, PT 10/14/2023, 2:00 PM

## 2023-10-16 ENCOUNTER — Encounter: Payer: Self-pay | Admitting: Physical Therapy

## 2023-10-16 ENCOUNTER — Ambulatory Visit: Payer: Medicare PPO | Admitting: Physical Therapy

## 2023-10-16 DIAGNOSIS — R2681 Unsteadiness on feet: Secondary | ICD-10-CM

## 2023-10-16 DIAGNOSIS — R2689 Other abnormalities of gait and mobility: Secondary | ICD-10-CM

## 2023-10-16 DIAGNOSIS — M21371 Foot drop, right foot: Secondary | ICD-10-CM

## 2023-10-16 DIAGNOSIS — M6281 Muscle weakness (generalized): Secondary | ICD-10-CM

## 2023-10-16 DIAGNOSIS — M21372 Foot drop, left foot: Secondary | ICD-10-CM

## 2023-10-16 NOTE — Therapy (Addendum)
 OUTPATIENT PHYSICAL THERAPY TREATMENT     Patient Name: [name] Trujillo MRN: [id number] DOB:Mar 06, 1941, 83 y.o., female Today's Date: 10/16/2023  END OF SESSION:  PT End of Session - 10/16/23 1314     Visit Number 18    Date for PT Re-Evaluation 11/13/23    Authorization Type [employer]    Authorization Time Period 09/11/23 - 11/13/23    Authorization - Visit Number 11    Authorization - Number of Visits 15   7 additional visits added to initial 8   Progress Note Due on Visit 20    PT Start Time 1314    PT Stop Time 1358    PT Time Calculation (min) 44 min    Activity Tolerance Patient tolerated treatment well    Behavior During Therapy [name] for tasks assessed/performed               Past Medical History:  Diagnosis Date   High cholesterol    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   She had ACUTE TAKOTSUBO (STRESS) CARDIOMYOPATHY with elevated Troponin Levels - this would be considered Demand Ischemia - Demand Infarction & NOT associated with ACS/CAD.     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  - Cath with NO CAD. ==> RESOLVED: f/u Echo 03/2022: EF 60-65%.   Past Surgical History:  Procedure Laterality Date   APPENDECTOMY     105-18 yo   BACK SURGERY     Age 61   CESAREAN SECTION     ECTOPIC PREGNANCY SURGERY     LAPAROSCOPIC HYSTERECTOMY     LEFT HEART CATH AND CORONARY ANGIOGRAPHY N/A 01/09/2022   Procedure: LEFT HEART CATH AND CORONARY ANGIOGRAPHY;  Surgeon: [name] Sharper, MD;  [name];  Service: CV:: Widely patent coronaries with mild nonobstructive LAD plaquing.  Right dominant system.  Normal LVEDP.  Based on clinical presentation,  findings are consistent with acute Takotsubo Cardiomyopathy Syndrome   POSTERIOR FUSION LUMBAR SPINE  05/24/2022   [facility], [place]; [name] Lemmings, MD): L2-L3 XLIF, L2-L3 POSTERIOR DECOMPRESSION AND FUSION   TRANSTHORACIC ECHOCARDIOGRAM  01/08/2022   Severely decreased LV function-EF 25 to 30%.  Mid to apical (mostly anterior) with normal basal motion.  GR 2 DD-elevated LAP.  Mildly dilated LA.  Aortic sclerosis with no stenosis.  No AI.  Normal MV with mild to moderate TR.  Mildly elevated RAP, and PAP (estimated 49 mg). If LAD CAD ruled out - consistent with Takotsubo CM Syndrome.   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    PCP: [name] Coy, MD   REFERRING PROVIDER: Tobie Raisin, NP   REFERRING DIAG: Z98.1 (ICD-10-CM) - S/p lumbar spinal fusion  Eval  and treat for: s/p lumbar fusion, lower extremity strengthening  THERAPY DIAG:  Muscle weakness (generalized)  Other abnormalities of gait and mobility  Unsteadiness on feet  Foot drop, left  Foot drop, right  RATIONALE FOR EVALUATION AND TREATMENT: Rehabilitation  ONSET DATE: 05/24/2022 - L2-L3 XLIF/PSF   NEXT MD VISIT: 12/31/2023 with [name] [name] Lemmings, MD (Surgeon)   SUBJECTIVE:                                                                                                                                                                                                         SUBJECTIVE  STATEMENT: [name] Tru[facility] reports she was able to iron w/o doing the balance dance today.  PAIN: Are you having pain? No  PERTINENT HISTORY:  L2-3 XLIF/PSF 05/24/22; C5-7 ACDF 09/21/20; idiopathic progressive neuropathy; hypthyroidism; DDD; remote h/o back surgery at age 32; h/o scoliosis; Takotsubo cardiomyopathy 01/10/22   PRECAUTIONS: Fall  RED FLAGS: Bowel or bladder incontinence: Yes: chronic bladder issues, better bowel control since surgery  WEIGHT BEARING RESTRICTIONS: No  FALLS:  Has patient fallen in last 6 months? No  LIVING ENVIRONMENT: Lives with: lives with their spouse Lives in: House/apartment Stairs: Yes: External: 5-6 steps; on left going up Has following equipment at home: Walker - 2 wheeled, shower chair, and hiking/walking poles  OCCUPATION: Retired runner, broadcasting/film/video   PLOF: Independent and Leisure: walking ~30 minutes 1x/day (3x around the court near where she lives); sewing/quilting; likes working in the yard but has not been able to do so due to her balance      PATIENT GOALS: Improved balance (to feel more comfortable in a crowd and walk more normally).   OBJECTIVE: (objective measures completed at initial evaluation unless otherwise dated)  DIAGNOSTIC FINDINGS:  06/30/23 - XR Lumbar spine: Stable postoperative changes of the lumbar spine with evidence of spinal fusion at L2-L3.   07/01/22 - Lumbar spine x-ray: Expected postoperative appearance related to L2-L3 fixation.    06/12/21 - Lumbar MRI: Postoperative and degenerative changes of the lumbar spine as described, similar compared to July 28, 2020. At L2-L3, there is moderate spinal canal stenosis due to disc bulging, endplate osteophytic spurring, facet and ligamentum flavum hypertrophy. Milder spinal canal stenosis is seen at L3-L4. There is multilevel neural foraminal stenosis.   PATIENT SURVEYS:  ABC scale 710 / 1600 = 44.4 % Modified Oswestry 24 / 50 = 48.0 %  - Patient reports her answers are based on  difficulty performing the activities related to balance more so than pain  09/18/23: ABC Scale: 470 / 1600 =  29.4 % Modified Oswestry: 26 / 50 = 52.0 % - Patient reports her answers are based on difficulty performing the activities related to balance more so than pain  SCREENING FOR RED FLAGS: Bowel or bladder incontinence: Yes: chronic bladder issues, better bowel control since surgery Spinal tumors: No Cauda equina syndrome: No Compression fracture: No Abdominal aneurysm: No  COGNITION:  Overall cognitive status: Within functional limits for tasks assessed    SENSATION: Light touch: Impaired - top of R foot Proprioception: Impaired    MUSCLE LENGTH: Hamstrings: mild/mod tight L>R ITB: mild tight B Piriformis: mod tight L>R Hip IR: mod tight B Hip flexors: WFL Quads: mild tight R>L Heelcord: mild/mod tight B  POSTURE:  rounded shoulders, forward head, right pelvic obliquity, and scoliosis  LUMBAR ROM: *ROM assessment limited in standing due to balance impairments  Active  Eval * 09/30/23 *  Flexion Hands to mid shin Hands to mid shin  Extension 50% limited 25% limited  Right lateral flexion Hand to mid thigh Hand to lateral knee  Left lateral flexion Hand to mid thigh Hand to lateral knee - pulling in R flank  Right rotation 50% limited 30% limited  Left rotation 50% limited 30% limited  (Blank rows = not tested)  LOWER EXTREMITY ROM:    Grossly WFL other than limitations indicated above due to muscle tightness  LOWER EXTREMITY MMT:    MMT Right eval Left eval R 09/16/23 L 09/16/23 R 10/07/23 L 10/07/23  Hip flexion 3 3 4- 4- 4- 4  Hip extension 4 4 4+ 4+ 5 5  Hip abduction 4- 4- 4- 4 4 4+  Hip adduction 3+ 3+ 3+ 3+ 3+ 4-  Hip internal rotation 4- 3+ 4+ 4 4+ 4+  Hip external rotation 3 3 4- 3+ 4- 4-  Knee flexion 5 5 5 5 5 5   Knee extension 4+ 4+ 4+ 4+ 5 5  Ankle dorsiflexion 3 3- 3+ 3- 4- 3  Ankle plantarflexion 2 2 2+ 2+ 2+ 4 in NWB 2+ 4 in NWB  Ankle inversion  3 3- 3+ 3- 3+ 3-  Ankle eversion 4- 3+ 4 4- 4+ 4   (Blank rows = not tested)  FUNCTIONAL TESTS:  5 times sit to stand: 11.50 sec Timed up and go (TUG): 14.00 sec with single hiking pole; 12.25 sec w/o AD - 08/12/23 10 meter walk test: 14.22 sec with single walking/trekking pole; Gait speed = 2.31 ft/sec Berg Balance Scale: 31/56; < 36 high risk for falls (close to 100%) - 08/12/23 Functional gait assessment: 11/30; < 19 = high risk fall - 08/12/23   09/18/23 = 14.12 sec with single hiking pole; 14.69 sec w/o AD Gait speed = 2.32 ft/sec with single hiking pole; 2.23 ft/sec w/o AD Berg = 38/56, 37-45 significant risk for falls (>80%)  FGA = 15/30, < 19 = high risk fall   10/16/23: 5xSTS = 11.84 sec Berg = 43/56; 37-45 = Significant (>80%) fall risk  Pt uses cane indoor: 44 - 46.5 indicates significant to high fall risk Pt uses cane outdoor: 47 - 49.6 indicates significant to high fall risk   Standardized testing results as of discharge from latest PT episode (12/31/22): 5xSTS: 10.72 sec Berg: 48/56; 46-51 moderate risk for falls (>50%)  : 16.15 sec w/o AD Gait speed: 2.03 ft/sec FGA: 19/30; 19-24 = medium risk fall   GAIT: Distance walked: Clinic distances Assistive device utilized:  Single walking/trekking pole on R Level of assistance: SBA Gait pattern:  Increased sway, step through pattern, decreased stride length, trendelenburg, lateral hip instability, poor foot clearance- Right, and poor foot clearance- Left Comments: L>R foot slap   TODAY'S TREATMENT:   10/16/23  THERAPEUTIC EXERCISE: To improve strength and endurance.  Demonstration, verbal and tactile cues throughout for technique.  Rec bike - L4 x 6 min (seat #2)  THERAPEUTIC ACTIVITIES: To improve functional performance.  Demonstration, verbal and tactile cues throughout for technique. 5xSTS = 11.84 sec Berg = 43/56; 37-45 = Significant (>80%) fall risk  Pt uses cane indoor: 44 - 46.5 indicates significant to  high fall risk Pt uses cane outdoor: 47 - 49.6 indicates significant to high fall risk     Berg Balance Test   Sit to Stand Able to stand without using hands and stabilize independently    Standing Unsupported Able to stand safely 2 minutes    Sitting with Back Unsupported but Feet Supported on Floor or Stool Able to sit safely and securely 2 minutes    Stand to Sit Sits safely with minimal use of hands    Transfers Able to transfer safely, minor use of hands    Standing Unsupported with Eyes Closed Able to stand 3 seconds    Standing Unsupported with Feet Together Able to place feet together independently and stand 1 minute safely    From Standing, Reach Forward with Outstretched Arm Can reach forward >12 cm safely (5)    From Standing Position, Pick up Object from Floor Able to pick up shoe, needs supervision    From Standing Position, Turn to Look Behind Over each Shoulder Looks behind one side only/other side shows less weight shift    Turn 360 Degrees Able to turn 360 degrees safely one side only in 4 seconds or less    Standing Unsupported, Alternately Place Feet on Step/Stool Able to stand independently and complete 8 steps >20 seconds    Standing Unsupported, One Foot in Front Needs help to step but can hold 15 seconds    Standing on One Leg Tries to lift leg/unable to hold 3 seconds but remains standing independently    Total Score 43    Berg comment: 37-45 = Significant (>80%) fall risk       NEUROMUSCULAR RE-EDUCATION: To improve balance, coordination, kinesthetic sense, posture, proprioception, and reduce fall risk.  3/4 tandem stance 3 x 15-20 bil with intermittent single UE support on back of chair SLS with tip-toe support on opp LE and intermittent single UE support on back of chair 3 x 15-20  Standing march with looped YTB at midfeet 2 x 10 - cues for ankle DF during hip flexion to encourage increased toe clearance/reduce foot slap (and prevent band from slipping  off)   10/14/23  THERAPEUTIC EXERCISE: To improve strength and endurance.  Demonstration, verbal and tactile cues throughout for technique.  Rec bike - L4 x 3 min, L3 x 3 min (seat #2) Seated B biceps curls 2# x 10 Bent over seated B triceps extension 2# x 10 Seated B shoulder OH press 2# x 10  NEUROMUSCULAR RE-EDUCATION: To improve balance, coordination, kinesthetic sense, posture, proprioception, and reduce fall risk. SBA to CGA of PT for all activities for safety. B side-stepping along counter - looped YTB at thighs 1 x 12 ft, at calves 1 x 12 ft, at ankles 2 x 12 ft Fwd/back monster walk with looped YTB at ankles 3 x 12 ft Standing march with looped YTB at midfeet 2 x 10 - cues  for ankle DF during hip flexion to encourage increased toe clearance/reduce foot slap (and prevent band from slipping off) B braiding & carioca/grapevine 2 x 12 ft each along counter - cues for L hip hike to facilitate L hip ABD/ADD during steps   10/09/23 NEUROMUSCULAR RE-EDUCATION: To improve balance, proprioception, coordination, and reduce fall risk.  SLS + opp LE 5-way star taps to colored dots x 5 each side Alt toe clears to 9 stool x 10 Lateral B  step-over 2 x 10 with TRX support - 1st set over yardstick, 2nd set over 1/2 FR Lateral B cross-over step with TRX support x 10  THERAPEUTIC EXERCISE: to improve flexibility, strength and mobility.  Demonstration, verbal and tactile cues throughout for technique.  Standing hip flexion + abduction + ER arc step-outs x 10 bil, single UE support on back of chair for balance Fwd step-ups to 6 step x 10 bil, single UE support on window ledge Lateral step-ups to 6 step x 10 bil, B UE support on window ledge   PATIENT EDUCATION:  Education details: progress with PT, ongoing PT POC, HEP review, and continue with current HEP Person educated: Patient Education method: Explanation Education comprehension: verbalized understanding  HOME EXERCISE PROGRAM: Access  Code: [email or web address] URL: [email or web address] Date: 10/14/2023 Prepared by: Elijah Hidden  Exercises - Standing Gastroc Stretch at Counter  - 1-2 x daily - 7 x weekly - 3 reps - 30 sec hold - Standing Gastroc Stretch on Foam 1/2 Roll  - 1-2 x daily - 7 x weekly - 3 reps - 30 sec hold - Standing Hip Flexor Stretch  - 1-2 x daily - 7 x weekly - 3 reps - 30 sec hold - Hip Flexor Stretch with Strap on Table  - 1-2 x daily - 7 x weekly - 3 reps - 30 sec hold - Hooklying Hamstring Stretch with Strap  - 1-2 x daily - 7 x weekly - 3 reps - 30 sec hold - Supine Iliotibial Band Stretch with Strap  - 1-2 x daily - 7 x weekly - 3 reps - 30 sec hold - Seated Table Piriformis Stretch  - 1-2 x daily - 7 x weekly - 3 reps - 30 sec hold - Standing 3-way Hip with Walker  - 1 x daily - 3 x weekly - 2 sets - 10 reps - 3 sec hold - Hip Hiking on Step  - 1 x daily - 3 x weekly - 2 sets - 10 reps - 3 sec hold - Clam with Resistance  - 1 x daily - 3 x weekly - 2 sets - 10 reps - 3-5 sec hold - Seated March with Resistance  - 1 x daily - 3 x weekly - 2 sets - 10 reps - 3 sec hold - Corner Balance Feet Together: Eyes Open With Head Turns  - 1 x daily - 7 x weekly - 2 sets - 5 reps - Carioca with Counter Support  - 1 x daily - 3 x weekly - 2 sets - 10 reps - 3 sec hold - Side Stepping with Resistance at Thighs and Counter Support  - 1 x daily - 3 x weekly - 2 sets - 10 reps - 3 sec hold - Forward Backward Monster Walk with Band at Thighs and Counter Support  - 1 x daily - 3 x weekly - 2 sets - 10 reps  Standing PWR! Moves   HEP from most recent PT episode: Access Code: [email or web address] URL: [email or web address]  Date: 11/28/2022 Prepared by: Elijah Hidden   Exercises - Seated Heel Toe Raises  - 1 x daily - 7 x weekly - 2 sets - 10 reps - 3 sec hold - Standing Isometric Hip Abduction with Ball on Wall  - 1 x daily - 7 x weekly - 2 sets - 10 reps - 3 sec hold - Seated Transversus Abdominis Bracing   - 2 x daily - 7 x weekly - 2 sets - 10 reps - 3-5 sec hold - Seated Isometric Hip Abduction with Resistance  - 1 x daily - 3 x weekly - 2 sets - 10 reps - 3 sec hold - Seated March with Resistance  - 1 x daily - 3 x weekly - 2 sets - 10 reps - 3 sec hold - Seated Shoulder Row with Anchored Resistance  - 1 x daily - 3 x weekly - 2 sets - 10 reps - 3-5 hold hold - Seated Single Arm Shoulder Row with Anchored Resistance  - 1 x daily - 3 x weekly - 2 sets - 10 reps - 3 sec hold - Seated Anti-Rotation Press With Anchored Resistance  - 1 x daily - 3 x weekly - 2 sets - 10 reps - 3 sec hold - Tall Kneeling Hip Hinge  - 1 x daily - 3-4 x weekly - 2 sets - 10 reps - 3 sec hold   Quadruped PWR! Up & Step   ASSESSMENT:  CLINICAL IMPRESSION: Mallorie reports continued awareness of improvement in unsupported standing balance at home, noting she was able to complete her ironing today w/o having to lean on the wall or the ironing board. Standardized balance testing with Lars confirming improving balance with score improved from 38/56 to 43/56, although this still demonstrates a significant (>80%) risk for falls, especially in people who primarily rely on a cane (or hiking pole in Vinessa's case) for support with ambulation.  Her baseline as of her D/C from her last PT episode was 48/56, indicating potential for further improvement, therefore LTG #9 revised to reflect further potential for improvement to further reduce fall risk.  She continues to rely heavily on combination of all sensory (visual, vestibular and proprioceptive) feedback systems such that when one is challenged/removed, she struggles to maintain her balance using only the remaining systems.  Courtenay remains motivated to further improve her strength, balance and quality of mobility, and will benefit from continued skilled PT to address ongoing weakness and balance deficits to improve mobility and activity tolerance with improved stability and balance to  reduce fall risk.    OBJECTIVE IMPAIRMENTS: Abnormal gait, decreased activity tolerance, decreased balance, decreased coordination, decreased endurance, decreased mobility, difficulty walking, decreased ROM, decreased strength, decreased safety awareness, increased fascial restrictions, impaired perceived functional ability, increased muscle spasms, impaired flexibility, impaired sensation, impaired tone, improper body mechanics, and postural dysfunction.   ACTIVITY LIMITATIONS: carrying, lifting, bending, sitting, standing, squatting, stairs, transfers, bed mobility, bathing, toileting, dressing, reach over head, hygiene/grooming, locomotion level, and caring for others  PARTICIPATION LIMITATIONS: meal prep, cleaning, laundry, driving, shopping, community activity, yard work, and church  PERSONAL FACTORS: Age, Past/current experiences, Time since onset of injury/illness/exacerbation, and 3+ comorbidities: L2-3 XLIF/PSF 05/24/22; C5-7 ACDF 09/21/20; idiopathic progressive neuropathy; hypthyroidism; DDD; remote h/o back surgery at age 79; h/o scoliosis; Takotsubo cardiomyopathy 01/10/22  are also affecting patient's functional outcome.   REHAB POTENTIAL: Good  CLINICAL DECISION MAKING: Evolving/moderate complexity  EVALUATION COMPLEXITY: Moderate   GOALS: Goals reviewed with patient? Yes  SHORT TERM GOALS: Target date: 08/26/2023  Patient will be independent with initial HEP to improve outcomes and carryover.  Baseline:  Goal status: MET - 08/21/23  2.  Complete balance assessment and update LTG's as indicated.  Baseline:  Goal status: MET - 08/12/23   LONG TERM GOALS: Target date: 09/23/2023, extended to 11/13/2023  Patient will be independent with ongoing/advanced HEP for self-management at home.  Baseline:  Goal status: PARTIALLY MET - 09/11/23 - Met for current HEP; further updates anticipated  2.  Patient to demonstrate ability to achieve and maintain good spinal alignment/posturing and  body mechanics needed for daily activities. Baseline:  Goal status: IN PROGRESS - 09/11/23 - Pt aware of good posture and body mechanics but limited at times by impaired balance  3.  Patient will demonstrate functional pain free lumbar ROM to perform ADLs.   Baseline: Refer to above lumbar ROM table Goal status: IN PROGRESS - 09/30/23 - lumbar ROM improving but still mostly limited by balance  4.  Patient will demonstrate improved B proximal LE strength to >/= 4 to 4+/5 for improved stability and ease of mobility . Baseline: Refer to above LE MMT table Goal status: IN PROGRESS - 10/07/23 - continued gains noted in overall B hip strength  5.  Patient will demonstrate improved B ankle strength to >/= 3+ to 4-/5 for improved gait stability with decreased foot slap.  Baseline: Refer to above LE MMT table Goal status: IN PROGRESS - 10/07/23 - continued gains in overall ankle strength, but remains unable to initiate body weight resisted heel raises  6.  Patient will report >/= 60% on ABC scale to demonstrate improved balance confidence.  Baseline: 710 / 1600 = 44.4 % Goal status: IN PROGRESS - 09/18/23 - 470 / 1600 = 29.4 %  7. Patient will report </= 36% on Modified Oswestry to demonstrate improved functional ability with decreased pain interference. Baseline: 24 / 50 = 48.0 % Goal status: IN PROGRESS - 09/18/23 - 26 / 50 = 52.0 %  8.  Patient will improve gait velocity to at least 2.62 ft/sec for improved gait efficiency and safety with community ambulation. Baseline: 2.31 ft/sec with single walking/trekking pole on R; 09/18/23 - 2.32 ft/sec with single hiking pole; 2.23 ft/sec w/o AD Goal status: PARTIALLY MET -  10/07/23 - 2.59 ft/sec with single hiking pole, 2.62 ft.sec w/o AD  9.  Patient will improve Berg score to >/= 39/56 (8 points) to improve safety and stability with ADLs in standing and reduce risk for falls.  Baseline: 31/56; 09/18/23 - 38/56 Goal status: MET & REVISED (see 9a. below) - 10/16/23  - 43/56  9a.  Patient will improve Berg score to >/= 47/56 to improve safety and stability with ADLs in standing and reduce risk for falls.  Baseline: 31/56 on eval; 09/18/23 - 38/56; 10/16/23 - 43/56 (previously 48/56 as of D/C from PT in 12/2022) Goal status: IN PROGRESS   10.  Patient will improve FGA score to >/= 15/30 (4 points) to improve gait stability and reduce risk for falls.  Baseline: 11/30 Goal status: MET & REVISED (see 10a. below) - 09/18/23 - 15/30  10a.  Patient will improve FGA score to >/= 19/30 (baseline as of prior D/C from PT) to improve gait stability and reduce risk for falls.  Baseline: 11/30 on eval, 15/30 as of 09/18/23 Goal status: IN PROGRESS    PLAN:  PT FREQUENCY: 2x/week  PT DURATION: 8 weeks  PLANNED INTERVENTIONS: 02835- PT Re-evaluation,  97110-Therapeutic exercises, 97530- Therapeutic activity, [id number]- Neuromuscular re-education, 97535- Self Care, 02859- Manual therapy, [phone]- Gait training, 97014- Electrical stimulation (unattended), [phone]- Electrical stimulation (manual), [phone]- Ionotophoresis 4mg /ml Dexamethasone, Patient/Family education, Balance training, Stair training, Taping, Dry Needling, Joint mobilization, DME instructions, Cryotherapy, and Moist heat  PLAN FOR NEXT SESSION: reassess and FGA; primary focus on balance training and gait stability; progress lumbopelvic/LE strengthening and core stabilization; review & HEP PRN   Elijah CHRISTELLA Hidden, PT 10/16/2023, 2:01 PM

## 2023-10-21 ENCOUNTER — Ambulatory Visit: Payer: Medicare PPO | Admitting: Physical Therapy

## 2023-10-23 ENCOUNTER — Ambulatory Visit: Payer: Medicare PPO | Admitting: Physical Therapy

## 2023-10-23 ENCOUNTER — Encounter: Payer: Self-pay | Admitting: Physical Therapy

## 2023-10-23 DIAGNOSIS — R2689 Other abnormalities of gait and mobility: Secondary | ICD-10-CM

## 2023-10-23 DIAGNOSIS — M21371 Foot drop, right foot: Secondary | ICD-10-CM

## 2023-10-23 DIAGNOSIS — R2681 Unsteadiness on feet: Secondary | ICD-10-CM

## 2023-10-23 DIAGNOSIS — M6281 Muscle weakness (generalized): Secondary | ICD-10-CM

## 2023-10-23 DIAGNOSIS — M21372 Foot drop, left foot: Secondary | ICD-10-CM

## 2023-10-23 NOTE — Therapy (Addendum)
OUTPATIENT PHYSICAL THERAPY TREATMENT     Patient Name: [name] Trujillo MRN: [id number] DOB:1941-06-09, 83 y.o., female Today's Date: 10/23/2023  END OF SESSION:  PT End of Session - 10/23/23 1315     Visit Number 19    Date for PT Re-Evaluation 11/13/23    Authorization Type [employer]    Authorization Time Period 09/11/23 - 11/13/23    Authorization - Visit Number 12    Authorization - Number of Visits 15   7 additional visits added to initial 8   Progress Note Due on Visit 20    Activity Tolerance Patient tolerated treatment well    Behavior During Therapy [facility] for tasks assessed/performed                Past Medical History:  Diagnosis Date   High cholesterol    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   She had ACUTE TAKOTSUBO (STRESS) CARDIOMYOPATHY with elevated Troponin Levels - this would be considered "Demand Ischemia - Demand Infarction" & NOT associated with ACS/CAD.     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  - Cath with NO CAD. ==> RESOLVED: f/u Echo 03/2022: EF 60-65%.   Past Surgical History:  Procedure Laterality Date   APPEN[age]CTOMY     73-18 yo   BACK SURGERY     Age 59   CESAREAN SECTION     ECTOPIC PREGNANCY SURGERY     LAPAROSCOPIC HYSTERECTOMY     LEFT HEART CATH AND CORONARY ANGIOGRAPHY N/A 01/09/2022   Procedure: LEFT HEART CATH AND CORONARY ANGIOGRAPHY;  Surgeon: Tonny Bollman, MD;  Location: [facility] INVASIVE CV LAB;  Service: CV:: Widely patent coronaries with mild nonobstructive LAD plaquing.  Right dominant system.  Normal LVEDP.  Based on clinical presentation, findings are consistent with acute Takotsubo Cardiomyopathy Syndrome   POSTERIOR  FUSION LUMBAR SPINE  05/24/2022   [facility], [place]; [name] Beath, MD): L2-L3 XLIF, L2-L3 POSTERIOR DECOMPRESSION AND FUSION   TRANSTHORACIC ECHOCARDIOGRAM  01/08/2022   Severely decreased LV function-EF 25 to 30%.  Mid to apical (mostly anterior) with normal basal motion.  GR 2 DD-elevated LAP.  Mildly dilated LA.  Aortic sclerosis with no stenosis.  No AI.  Normal MV with mild to moderate TR.  Mildly elevated RAP, and PAP (estimated 49 mg). If LAD CAD ruled out - consistent with Takotsubo CM Syndrome.   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    PCP: Mosetta Putt, MD   REFERRING PROVIDER: Rondel Oh, NP   REFERRING DIAG: Z98.1 (ICD-10-CM) - S/p lumbar spinal fusion  Eval and treat for: s/p lumbar fusion, lower extremity strengthening  THERAPY DIAG:  Muscle weakness (generalized)  Other abnormalities of gait and  mobility  Unsteadiness on feet  Foot drop, left  Foot drop, right  RATIONALE FOR EVALUATION AND TREATMENT: Rehabilitation  ONSET DATE: 05/24/2022 - L2-L3 XLIF/PSF   NEXT MD VISIT: 12/31/2023 with Marlaine Hind, MD (Surgeon)   SUBJECTIVE:                                                                                                                                                                                                         SUBJECTIVE STATEMENT: [name] Trujillo reports increased fatigue today having to get up early for a physical exam  visit with her PCP.  PAIN: Are you having pain? No  PERTINENT HISTORY:  L2-3 XLIF/PSF 05/24/22; C5-7 ACDF 09/21/20; idiopathic progressive neuropathy; hypthyroidism; DDD; remote h/o back surgery at age 50; h/o scoliosis; Takotsubo cardiomyopathy 01/10/22   PRECAUTIONS: Fall  RED FLAGS: Bowel or bladder incontinence: Yes: chronic bladder issues, better bowel control since surgery  WEIGHT BEARING RESTRICTIONS: No  FALLS:  Has patient fallen in last 6 months? No  LIVING ENVIRONMENT: Lives with: lives with their spouse Lives in: House/apartment Stairs: Yes: External: 5-6 steps; on left going up Has following equipment at home: Environmental consultant - 2 wheeled, shower chair, and hiking/walking poles  OCCUPATION: Retired Runner, broadcasting/film/video   PLOF: Independent and Leisure: walking ~30 minutes 1x/day (3x around the court near where she lives); sewing/quilting; likes working in the yard but has not been able to do so due to her balance      PATIENT GOALS: "Improved balance (to feel more comfortable in a crowd and walk more normally)."   OBJECTIVE: (objective measures completed at initial evaluation unless otherwise dated)  DIAGNOSTIC FINDINGS:  06/30/23 - XR Lumbar spine: Stable postoperative changes of the lumbar spine with evidence of spinal fusion at L2-L3.   07/01/22 - Lumbar spine x-ray: Expected postoperative appearance related to L2-L3 fixation.    06/12/21 - Lumbar MRI: Postoperative and degenerative changes of the lumbar spine as described, similar compared to July 28, 2020. At L2-L3, there is moderate spinal canal stenosis due to disc bulging, endplate osteophytic spurring, facet and ligamentum flavum hypertrophy. Milder spinal canal stenosis is seen at L3-L4. There is multilevel neural foraminal stenosis.   PATIENT SURVEYS:  ABC scale 710 / 1600 = 44.4 % Modified Oswestry 24 / 50 = 48.0 %  - Patient reports her answers are based on difficulty performing the activities related to balance more so  than pain  09/18/23: ABC Scale: 470 / 1600 = 29.4 % Modified Oswestry: 26 / 50 = 52.0 % - Patient reports her answers are  based on difficulty performing the activities related to balance more so than pain  SCREENING FOR RED FLAGS: Bowel or bladder incontinence: Yes: chronic bladder issues, better bowel control since surgery Spinal tumors: No Cauda equina syndrome: No Compression fracture: No Abdominal aneurysm: No  COGNITION:  Overall cognitive status: Within functional limits for tasks assessed    SENSATION: Light touch: Impaired - top of R foot Proprioception: Impaired    MUSCLE LENGTH: Hamstrings: mild/mod tight L>R ITB: mild tight B Piriformis: mod tight L>R Hip IR: mod tight B Hip flexors: WFL Quads: mild tight R>L Heelcord: mild/mod tight B  POSTURE:  rounded shoulders, forward head, right pelvic obliquity, and scoliosis  LUMBAR ROM: *ROM assessment limited in standing due to balance impairments  Active  Eval * 09/30/23 *  Flexion Hands to mid shin Hands to mid shin  Extension 50% limited 25% limited  Right lateral flexion Hand to mid thigh Hand to lateral knee  Left lateral flexion Hand to mid thigh Hand to lateral knee - pulling in R flank  Right rotation 50% limited 30% limited  Left rotation 50% limited 30% limited  (Blank rows = not tested)  LOWER EXTREMITY ROM:    Grossly WFL other than limitations indicated above due to muscle tightness  LOWER EXTREMITY MMT:    MMT Right eval Left eval R 09/16/23 L 09/16/23 R 10/07/23 L 10/07/23  Hip flexion 3 3 4- 4- 4- 4  Hip extension 4 4 4+ 4+ 5 5  Hip abduction 4- 4- 4- 4 4 4+  Hip adduction 3+ 3+ 3+ 3+ 3+ 4-  Hip internal rotation 4- 3+ 4+ 4 4+ 4+  Hip external rotation 3 3 4- 3+ 4- 4-  Knee flexion 5 5 5 5 5 5   Knee extension 4+ 4+ 4+ 4+ 5 5  Ankle dorsiflexion 3 3- 3+ 3- 4- 3  Ankle plantarflexion 2 2 2+ 2+ 2+ 4 in NWB 2+ 4 in NWB  Ankle inversion 3 3- 3+ 3- 3+ 3-  Ankle eversion 4- 3+ 4 4- 4+ 4   (Blank rows  = not tested)  FUNCTIONAL TESTS:  5 times sit to stand: 11.50 sec Timed up and go (TUG): 14.00 sec with single hiking pole; 12.25 sec w/o AD - 08/12/23 10 meter walk test: 14.22 sec with single walking/trekking pole; Gait speed = 2.31 ft/sec Berg Balance Scale: 31/56; < 36 high risk for falls (close to 100%) - 08/12/23 Functional gait assessment: 11/30; < 19 = high risk fall - 08/12/23   09/18/23 = 14.12 sec with single hiking pole; 14.69 sec w/o AD Gait speed = 2.32 ft/sec with single hiking pole; 2.23 ft/sec w/o AD Berg = 38/56, 37-45 significant risk for falls (>80%)  FGA = 15/30, < 19 = high risk fall   10/16/23: 5xSTS = 11.84 sec Berg = 43/56; 37-45 = Significant (>80%) fall risk  Pt uses cane indoor: 44 - 46.5 indicates significant to high fall risk Pt uses cane outdoor: 47 - 49.6 indicates significant to high fall risk   Standardized testing results as of discharge from latest PT episode (12/31/22): 5xSTS: 10.72 sec Berg: 48/56; 46-51 moderate risk for falls (>50%)  : 16.15 sec w/o AD Gait speed: 2.03 ft/sec FGA: 19/30; 19-24 = medium risk fall   GAIT: Distance walked: Clinic distances Assistive device utilized:  Single walking/trekking pole on R Level of assistance: SBA Gait pattern:  Increased sway, step through pattern, decreased stride length, trendelenburg, lateral hip instability, poor foot clearance-  Right, and poor foot clearance- Left Comments: L>R foot slap   TODAY'S TREATMENT:   10/23/23 THERAPEUTIC EXERCISE: To improve strength, endurance, and flexibility.  Demonstration, verbal and tactile cues throughout for technique.  Rec bike - L4 x 6 min (seat #2) Hooklying LTR x 2 S/L open book stretch 10 x 5" bil  NEUROMUSCULAR RE-EDUCATION: To improve balance, coordination, kinesthetic sense, posture, and proprioception.  Quadruped alt UE flexion x 10 Quadruped alt LE extension x 10 Quadruped bird dog x 5 with peanut ball under torso Tall kneel hip hinge  on Airex pad in front of mat table x 10 with light intermittent UE support on mat table Seated on dynadisc with Airex pad under feet: Alt LE march x 10 Alt LAQ x 10 Alt UE flexion + opp LE march x 10 RTB scap retraction + B shoulder row x 10 RTB scap retraction + B shoulder extension x 10 RTB pallof press x 10  SELF CARE: Provided education to prevent loss of gains achieved with physical therapy, to prevent future decline in function, and on ways to stay active when unable to go for normal daily walks .  Discussed options for increased activity in her home when unable to walk for exercise as well as ways to promote carryover of exercises such as use of recumbent bike - recommended restorator style pedal exerciser such as: Folding Exercise Bike Pedal Exerciser Portable Desk Bike with LCD Display for Arms and Legs Workout - [email or web address]://[email or web address]    10/16/23  THERAPEUTIC EXERCISE: To improve strength and endurance.  Demonstration, verbal and tactile cues throughout for technique.  Rec bike - L4 x 6 min (seat #2)  THERAPEUTIC ACTIVITIES: To improve functional performance.  Demonstration, verbal and tactile cues throughout for technique. 5xSTS = 11.84 sec Berg = 43/56; 37-45 = Significant (>80%) fall risk  Pt uses cane indoor: 44 - 46.5 indicates significant to high fall risk Pt uses cane outdoor: 47 - 49.6 indicates significant to high fall risk    Berg Balance Test   Sit to Stand Able to stand without using hands and stabilize independently    Standing Unsupported Able to stand safely 2 minutes    Sitting with Back Unsupported but Feet Supported on Floor or Stool Able to sit safely and securely 2 minutes    Stand to Sit Sits safely with minimal use of hands    Transfers Able to transfer safely, minor use of hands    Standing Unsupported with Eyes Closed Able to stand 3 seconds    Standing Unsupported with Feet Together Able to place feet together independently and stand 1 minute safely     From Standing, Reach Forward with Outstretched Arm Can reach forward >12 cm safely (5")    From Standing Position, Pick up Object from Floor Able to pick up shoe, needs supervision    From Standing Position, Turn to Look Behind Over each Shoulder Looks behind one side only/other side shows less weight shift    Turn 360 Degrees Able to turn 360 degrees safely one side only in 4 seconds or less    Standing Unsupported, Alternately Place Feet on Step/Stool Able to stand independently and complete 8 steps >20 seconds    Standing Unsupported, One Foot in Front Needs help to step but can hold 15 seconds    Standing on One Leg Tries to lift leg/unable to hold 3 seconds but remains standing independently    Total Score 43    Berg comment: 37-45 =  Significant (>80%) fall risk       NEUROMUSCULAR RE-EDUCATION: To improve balance, coordination, kinesthetic sense, posture, proprioception, and reduce fall risk.  3/4 tandem stance 3 x 15-20" bil with intermittent single UE support on back of chair SLS with tip-toe support on opp LE and intermittent single UE support on back of chair 3 x 15-20"  Standing march with looped YTB at midfeet 2 x 10 - cues for ankle DF during hip flexion to encourage increased toe clearance/reduce foot slap (and prevent band from slipping off)   10/14/23  THERAPEUTIC EXERCISE: To improve strength and endurance.  Demonstration, verbal and tactile cues throughout for technique.  Rec bike - L4 x 3 min, L3 x 3 min (seat #2) Seated B biceps curls 2# x 10 Bent over seated B triceps extension 2# x 10 Seated B shoulder OH press 2# x 10  NEUROMUSCULAR RE-EDUCATION: To improve balance, coordination, kinesthetic sense, posture, proprioception, and reduce fall risk. SBA to CGA of PT for all activities for safety. B side-stepping along counter - looped YTB at thighs 1 x 12 ft, at calves 1 x 12 ft, at ankles 2 x 12 ft Fwd/back monster walk with looped YTB at ankles 3 x 12 ft Standing march  with looped YTB at midfeet 2 x 10 - cues for ankle DF during hip flexion to encourage increased toe clearance/reduce foot slap (and prevent band from slipping off) B braiding & carioca/grapevine 2 x 12 ft each along counter - cues for L hip hike to facilitate L hip ABD/ADD during steps   PATIENT EDUCATION:  Education details: continue with current HEP Person educated: Patient Education method: Explanation Education comprehension: verbalized understanding  HOME EXERCISE PROGRAM: Access Code: [email or web address] URL: [email or web address] Date: 10/14/2023 Prepared by: Glenetta Hew  Exercises - Standing Gastroc Stretch at Counter  - 1-2 x daily - 7 x weekly - 3 reps - 30 sec hold - Standing Gastroc Stretch on Foam 1/2 Roll  - 1-2 x daily - 7 x weekly - 3 reps - 30 sec hold - Standing Hip Flexor Stretch  - 1-2 x daily - 7 x weekly - 3 reps - 30 sec hold - Hip Flexor Stretch with Strap on Table  - 1-2 x daily - 7 x weekly - 3 reps - 30 sec hold - Hooklying Hamstring Stretch with Strap  - 1-2 x daily - 7 x weekly - 3 reps - 30 sec hold - Supine Iliotibial Band Stretch with Strap  - 1-2 x daily - 7 x weekly - 3 reps - 30 sec hold - Seated Table Piriformis Stretch  - 1-2 x daily - 7 x weekly - 3 reps - 30 sec hold - Standing 3-way Hip with Walker  - 1 x daily - 3 x weekly - 2 sets - 10 reps - 3 sec hold - Hip Hiking on Step  - 1 x daily - 3 x weekly - 2 sets - 10 reps - 3 sec hold - Clam with Resistance  - 1 x daily - 3 x weekly - 2 sets - 10 reps - 3-5 sec hold - Seated March with Resistance  - 1 x daily - 3 x weekly - 2 sets - 10 reps - 3 sec hold - Corner Balance Feet Together: Eyes Open With Head Turns  - 1 x daily - 7 x weekly - 2 sets - 5 reps - Carioca with Counter Support  - 1 x daily - 3 x weekly - 2  sets - 10 reps - 3 sec hold - Side Stepping with Resistance at Thighs and Counter Support  - 1 x daily - 3 x weekly - 2 sets - 10 reps - 3 sec hold - Forward Backward Monster Walk with  Band at Thighs and Counter Support  - 1 x daily - 3 x weekly - 2 sets - 10 reps  Standing PWR! Moves   HEP from most recent PT episode: Access Code: [email or web address] URL: [email or web address] Date: 11/28/2022 Prepared by: Glenetta Hew   Exercises - Seated Heel Toe Raises  - 1 x daily - 7 x weekly - 2 sets - 10 reps - 3 sec hold - Standing Isometric Hip Abduction with Ball on Wall  - 1 x daily - 7 x weekly - 2 sets - 10 reps - 3 sec hold - Seated Transversus Abdominis Bracing  - 2 x daily - 7 x weekly - 2 sets - 10 reps - 3-5 sec hold - Seated Isometric Hip Abduction with Resistance  - 1 x daily - 3 x weekly - 2 sets - 10 reps - 3 sec hold - Seated March with Resistance  - 1 x daily - 3 x weekly - 2 sets - 10 reps - 3 sec hold - Seated Shoulder Row with Anchored Resistance  - 1 x daily - 3 x weekly - 2 sets - 10 reps - 3-5 hold hold - Seated Single Arm Shoulder Row with Anchored Resistance  - 1 x daily - 3 x weekly - 2 sets - 10 reps - 3 sec hold - Seated Anti-Rotation Press With Anchored Resistance  - 1 x daily - 3 x weekly - 2 sets - 10 reps - 3 sec hold - Tall Kneeling Hip Hinge  - 1 x daily - 3-4 x weekly - 2 sets - 10 reps - 3 sec hold   Quadruped PWR! Up & Step   ASSESSMENT:  CLINICAL IMPRESSION: Idell requested to defer the standardized balance testing today as she reports increased fatigue having to get up early and go for her annual physical appointment this morning.  She mentioned that she sometimes notes some L flank pain when working on some of her HEP, therefore shifted today's focus to address this with gentle stretching for muscle tightness along with core strengthening incorporating unstable surfaces to facilitate balance and combination movements for improved coordination.  Danessa also noted feeling off today due to having to miss her 1st PT appointment this week and not being able to go for her normal daily walks due the inclement weather over the past few days,  therefore discussed alteratives on ways to promote increased activity in her home including use of restorator style pedal exerciser.  Sharon remains motivated to further improve her strength, balance and quality of mobility, and will benefit from continued skilled PT to address ongoing weakness and balance deficits to improve mobility and activity tolerance with improved stability and balance to reduce fall risk.    OBJECTIVE IMPAIRMENTS: Abnormal gait, decreased activity tolerance, decreased balance, decreased coordination, decreased endurance, decreased mobility, difficulty walking, decreased ROM, decreased strength, decreased safety awareness, increased fascial restrictions, impaired perceived functional ability, increased muscle spasms, impaired flexibility, impaired sensation, impaired tone, improper body mechanics, and postural dysfunction.   ACTIVITY LIMITATIONS: carrying, lifting, bending, sitting, standing, squatting, stairs, transfers, bed mobility, bathing, toileting, dressing, reach over head, hygiene/grooming, locomotion level, and caring for others  PARTICIPATION LIMITATIONS: meal prep, cleaning, laundry, driving, shopping, community activity, yard work, and  church  PERSONAL FACTORS: Age, Past/current experiences, Time since onset of injury/illness/exacerbation, and 3+ comorbidities: L2-3 XLIF/PSF 05/24/22; C5-7 ACDF 09/21/20; idiopathic progressive neuropathy; hypthyroidism; DDD; remote h/o back surgery at age 71; h/o scoliosis; Takotsubo cardiomyopathy 01/10/22  are also affecting patient's functional outcome.   REHAB POTENTIAL: Good  CLINICAL DECISION MAKING: Evolving/moderate complexity  EVALUATION COMPLEXITY: Moderate   GOALS: Goals reviewed with patient? Yes  SHORT TERM GOALS: Target date: 08/26/2023  Patient will be independent with initial HEP to improve outcomes and carryover.  Baseline:  Goal status: MET - 08/21/23  2.  Complete balance assessment and update LTG's as  indicated.  Baseline:  Goal status: MET - 08/12/23   LONG TERM GOALS: Target date: 09/23/2023, extended to 11/13/2023  Patient will be independent with ongoing/advanced HEP for self-management at home.  Baseline:  Goal status: PARTIALLY MET - 09/11/23 - Met for current HEP; further updates anticipated  2.  Patient to demonstrate ability to achieve and maintain good spinal alignment/posturing and body mechanics needed for daily activities. Baseline:  Goal status: IN PROGRESS - 09/11/23 - Pt aware of good posture and body mechanics but limited at times by impaired balance  3.  Patient will demonstrate functional pain free lumbar ROM to perform ADLs.   Baseline: Refer to above lumbar ROM table Goal status: IN PROGRESS - 09/30/23 - lumbar ROM improving but still mostly limited by balance  4.  Patient will demonstrate improved B proximal LE strength to >/= 4 to 4+/5 for improved stability and ease of mobility . Baseline: Refer to above LE MMT table Goal status: IN PROGRESS - 10/07/23 - continued gains noted in overall B hip strength  5.  Patient will demonstrate improved B ankle strength to >/= 3+ to 4-/5 for improved gait stability with decreased foot slap.  Baseline: Refer to above LE MMT table Goal status: IN PROGRESS - 10/07/23 - continued gains in overall ankle strength, but remains unable to initiate body weight resisted heel raises  6.  Patient will report >/= 60% on ABC scale to demonstrate improved balance confidence.  Baseline: 710 / 1600 = 44.4 % Goal status: IN PROGRESS - 09/18/23 - 470 / 1600 = 29.4 %  7. Patient will report </= 36% on Modified Oswestry to demonstrate improved functional ability with decreased pain interference. Baseline: 24 / 50 = 48.0 % Goal status: IN PROGRESS - 09/18/23 - 26 / 50 = 52.0 %  8.  Patient will improve gait velocity to at least 2.62 ft/sec for improved gait efficiency and safety with community ambulation. Baseline: 2.31 ft/sec with single walking/trekking  pole on R; 09/18/23 - 2.32 ft/sec with single hiking pole; 2.23 ft/sec w/o AD Goal status: PARTIALLY MET -  10/07/23 - 2.59 ft/sec with single hiking pole, 2.62 ft.sec w/o AD  9.  Patient will improve Berg score to >/= 39/56 (8 points) to improve safety and stability with ADLs in standing and reduce risk for falls.  Baseline: 31/56; 09/18/23 - 38/56 Goal status: MET & REVISED (see 9a. below) - 10/16/23 - 43/56  9a.  Patient will improve Berg score to >/= 47/56 to improve safety and stability with ADLs in standing and reduce risk for falls.  Baseline: 31/56 on eval; 09/18/23 - 38/56; 10/16/23 - 43/56 (previously 48/56 as of D/C from PT in 12/2022) Goal status: IN PROGRESS   10.  Patient will improve FGA score to >/= 15/30 (4 points) to improve gait stability and reduce risk for falls.  Baseline: 11/30 Goal status:  MET & REVISED (see 10a. below) - 09/18/23 - 15/30  10a.  Patient will improve FGA score to >/= 19/30 (baseline as of prior D/C from PT) to improve gait stability and reduce risk for falls.  Baseline: 11/30 on eval, 15/30 as of 09/18/23 Goal status: IN PROGRESS    PLAN:  PT FREQUENCY: 2x/week  PT DURATION: 8 weeks  PLANNED INTERVENTIONS: 97164- PT Re-evaluation, 97110-Therapeutic exercises, 97530- Therapeutic activity, 97112- Neuromuscular re-education, 97535- Self Care, 16109- Manual therapy, 97116- Gait training, 97014- Electrical stimulation (unattended), [phone]- Electrical stimulation (manual), [phone]- Ionotophoresis 4mg /ml Dexamethasone, Patient/Family education, Balance training, Stair training, Taping, Dry Needling, Joint mobilization, DME instructions, Cryotherapy, and Moist heat  PLAN FOR NEXT SESSION: reassess and FGA; primary focus on balance training and gait stability; progress lumbopelvic/LE strengthening and core stabilization; review & HEP PRN   Marry Guan, PT 10/23/2023, 2:26 PM

## 2023-10-24 ENCOUNTER — Ambulatory Visit (HOSPITAL_BASED_OUTPATIENT_CLINIC_OR_DEPARTMENT_OTHER)
Admission: RE | Admit: 2023-10-24 | Discharge: 2023-10-24 | Disposition: A | Discharge status: Home / Self Care | Payer: Medicare PPO | Source: Ambulatory Visit | Attending: General Practice | Admitting: General Practice

## 2023-10-24 DIAGNOSIS — R0609 Other forms of dyspnea: Secondary | ICD-10-CM | POA: Insufficient documentation

## 2023-10-24 LAB — ECHOCARDIOGRAM COMPLETE
AR max vel: 1.63 cm2
AV Area VTI: 1.56 cm2
AV Area mean vel: 1.45 cm2
AV Mean grad: 5 mm[Hg]
AV Peak grad: 7.6 mm[Hg]
Ao pk vel: 1.38 m/s
Area-P 1/2: 2.99 cm2
Calc EF: 61.6 %
MV M vel: 3.6 m/s
MV Peak grad: 51.7 mm[Hg]
S' Lateral: 2.7 cm
Single Plane A2C EF: 61.9 %
Single Plane A4C EF: 65.3 %

## 2023-10-28 ENCOUNTER — Encounter: Payer: Self-pay | Admitting: Physical Therapy

## 2023-10-28 ENCOUNTER — Ambulatory Visit: Payer: Medicare PPO | Admitting: Physical Therapy

## 2023-10-28 DIAGNOSIS — M6281 Muscle weakness (generalized): Secondary | ICD-10-CM | POA: Diagnosis not present

## 2023-10-28 DIAGNOSIS — R2689 Other abnormalities of gait and mobility: Secondary | ICD-10-CM

## 2023-10-28 DIAGNOSIS — M21371 Foot drop, right foot: Secondary | ICD-10-CM

## 2023-10-28 DIAGNOSIS — M21372 Foot drop, left foot: Secondary | ICD-10-CM

## 2023-10-28 DIAGNOSIS — R2681 Unsteadiness on feet: Secondary | ICD-10-CM

## 2023-10-28 NOTE — Therapy (Addendum)
 OUTPATIENT PHYSICAL THERAPY TREATMENT    Progress Note  Reporting Period 09/18/2023 to 10/28/2023  See note below for Objective Data and Assessment of Progress/Goals.     Patient Name: [name] Trujillo MRN: [id number] DOB:1941-08-18, 83 y.o., female Today's Date: 10/28/2023  END OF SESSION:  PT End of Session - 10/28/23 1317     Visit Number 20    Date for PT Re-Evaluation 11/13/23    Authorization Type [employer]    Authorization Time Period 09/11/23 - 11/13/23    Authorization - Visit Number 13    Authorization - Number of Visits 15   7 additional visits added to initial 8   Progress Note Due on Visit 20    PT Start Time 1317    PT Stop Time 1405    PT Time Calculation (min) 48 min    Activity Tolerance Patient tolerated treatment well    Behavior During Therapy [facility] for tasks assessed/performed                 Past Medical History:  Diagnosis Date   High cholesterol    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   She had ACUTE TAKOTSUBO (STRESS) CARDIOMYOPATHY with elevated Troponin Levels - this would be considered "Demand Ischemia - Demand Infarction" & NOT associated with ACS/CAD.     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  - Cath with NO CAD. ==> RESOLVED: f/u Echo 03/2022: EF 60-65%.   Past Surgical History:  Procedure Laterality Date   APPEN[age]CTOMY     63-18 yo   BACK SURGERY     Age 89   CESAREAN SECTION     ECTOPIC PREGNANCY SURGERY     LAPAROSCOPIC HYSTERECTOMY     LEFT HEART CATH AND CORONARY ANGIOGRAPHY N/A 01/09/2022   Procedure: LEFT HEART CATH AND CORONARY ANGIOGRAPHY;  Surgeon: Tonny Bollman, MD;  Location: [facility] INVASIVE CV LAB;  Service: CV:: Widely  patent coronaries with mild nonobstructive LAD plaquing.  Right dominant system.  Normal LVEDP.  Based on clinical presentation, findings are consistent with acute Takotsubo Cardiomyopathy Syndrome   POSTERIOR FUSION LUMBAR SPINE  05/24/2022   [facility], [place]; [name], MD): L2-L3 XLIF, L2-L3 POSTERIOR DECOMPRESSION AND FUSION   TRANSTHORACIC ECHOCARDIOGRAM  01/08/2022   Severely decreased LV function-EF 25 to 30%.  Mid to apical (mostly anterior) with normal basal motion.  GR 2 DD-elevated LAP.  Mildly dilated LA.  Aortic sclerosis with no stenosis.  No AI.  Normal MV with mild to moderate TR.  Mildly elevated RAP, and PAP (estimated 49 mg). If LAD CAD ruled out - consistent with Takotsubo CM Syndrome.   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  PCP: Mosetta Putt, MD   REFERRING PROVIDER: Rondel Oh, NP   REFERRING DIAG: Z98.1 (ICD-10-CM) - S/p lumbar spinal fusion  Eval and treat for: s/p lumbar fusion, lower extremity strengthening  THERAPY DIAG:  Muscle weakness (generalized)  Other abnormalities of gait and mobility  Unsteadiness on feet  Foot drop, left  Foot drop, right  RATIONALE FOR EVALUATION AND TREATMENT: Rehabilitation  ONSET DATE: 05/24/2022 - L2-L3 XLIF/PSF   NEXT MD VISIT: 12/31/2023 with Marlaine Hind, MD (Surgeon)   SUBJECTIVE:                                                                                                                                                                                                          SUBJECTIVE STATEMENT: [name] Trujillo reports she was able to walk 3x around the block yesterday, but with decreasing speed/pace and increased foot slap on latter laps.  PAIN: Are you having pain? No  PERTINENT HISTORY:  L2-3 XLIF/PSF 05/24/22; C5-7 ACDF 09/21/20; idiopathic progressive neuropathy; hypthyroidism; DDD; remote h/o back surgery at age 82; h/o scoliosis; Takotsubo cardiomyopathy 01/10/22   PRECAUTIONS: Fall  RED FLAGS: Bowel or bladder incontinence: Yes: chronic bladder issues, better bowel control since surgery  WEIGHT BEARING RESTRICTIONS: No  FALLS:  Has patient fallen in last 6 months? No  LIVING ENVIRONMENT: Lives with: lives with their spouse Lives in: House/apartment Stairs: Yes: External: 5-6 steps; on left going up Has following equipment at home: Environmental consultant - 2 wheeled, shower chair, and hiking/walking poles  OCCUPATION: Retired Runner, broadcasting/film/video   PLOF: Independent and Leisure: walking ~30 minutes 1x/day (3x around the court near where she lives); sewing/quilting; likes working in the yard but has not been able to do so due to her balance      PATIENT GOALS: "Improved balance (to feel more comfortable in a crowd and walk more normally)."   OBJECTIVE: (objective measures completed at initial evaluation unless otherwise dated)  DIAGNOSTIC FINDINGS:  06/30/23 - XR Lumbar spine: Stable postoperative changes of the lumbar spine with evidence of spinal fusion at L2-L3.   07/01/22 - Lumbar spine x-ray: Expected postoperative appearance related to L2-L3 fixation.    06/12/21 - Lumbar MRI: Postoperative and degenerative changes of the lumbar spine as described, similar compared to July 28, 2020. At L2-L3, there is moderate spinal canal stenosis due to disc bulging, endplate osteophytic spurring, facet and ligamentum flavum hypertrophy. Milder spinal canal stenosis is seen at L3-L4.  There is multilevel neural foraminal stenosis.   PATIENT SURVEYS:  ABC scale 710 / 1600 = 44.4 % Modified  Oswestry 24 / 50 = 48.0 %  - Patient reports her answers are based on difficulty performing the activities related to balance more so than pain  09/18/23: ABC Scale: 470 / 1600 = 29.4 % Modified Oswestry: 26 / 50 = 52.0 % - Patient reports her answers are based on difficulty performing the activities related to balance more so than pain  SCREENING FOR RED FLAGS: Bowel or bladder incontinence: Yes: chronic bladder issues, better bowel control since surgery Spinal tumors: No Cauda equina syndrome: No Compression fracture: No Abdominal aneurysm: No  COGNITION:  Overall cognitive status: Within functional limits for tasks assessed    SENSATION: Light touch: Impaired - top of R foot Proprioception: Impaired    MUSCLE LENGTH: Hamstrings: mild/mod tight L>R ITB: mild tight B Piriformis: mod tight L>R Hip IR: mod tight B Hip flexors: WFL Quads: mild tight R>L Heelcord: mild/mod tight B  POSTURE:  rounded shoulders, forward head, right pelvic obliquity, and scoliosis  LUMBAR ROM: *ROM assessment limited in standing due to balance impairments  Active  Eval * 09/30/23 *  Flexion Hands to mid shin Hands to mid shin  Extension 50% limited 25% limited  Right lateral flexion Hand to mid thigh Hand to lateral knee  Left lateral flexion Hand to mid thigh Hand to lateral knee - pulling in R flank  Right rotation 50% limited 30% limited  Left rotation 50% limited 30% limited  (Blank rows = not tested)  LOWER EXTREMITY ROM:    Grossly WFL other than limitations indicated above due to muscle tightness  LOWER EXTREMITY MMT:    MMT Right eval Left eval R 09/16/23 L 09/16/23 R 10/07/23 L 10/07/23  Hip flexion 3 3 4- 4- 4- 4  Hip extension 4 4 4+ 4+ 5 5  Hip abduction 4- 4- 4- 4 4 4+  Hip adduction 3+ 3+ 3+ 3+ 3+ 4-  Hip internal rotation 4- 3+ 4+ 4 4+ 4+  Hip external rotation 3 3 4- 3+  4- 4-  Knee flexion 5 5 5 5 5 5   Knee extension 4+ 4+ 4+ 4+ 5 5  Ankle dorsiflexion 3 3- 3+ 3- 4- 3  Ankle plantarflexion 2 2 2+ 2+ 2+ 4 in NWB 2+ 4 in NWB  Ankle inversion 3 3- 3+ 3- 3+ 3-  Ankle eversion 4- 3+ 4 4- 4+ 4   (Blank rows = not tested)  FUNCTIONAL TESTS:  5 times sit to stand: 11.50 sec Timed up and go (TUG): 14.00 sec with single hiking pole; 12.25 sec w/o AD - 08/12/23 10 meter walk test: 14.22 sec with single walking/trekking pole; Gait speed = 2.31 ft/sec Berg Balance Scale: 31/56; < 36 high risk for falls (close to 100%) - 08/12/23 Functional gait assessment: 11/30; < 19 = high risk fall - 08/12/23   09/18/23 = 14.12 sec with single hiking pole; 14.69 sec w/o AD Gait speed = 2.32 ft/sec with single hiking pole; 2.23 ft/sec w/o AD Berg = 38/56, 37-45 significant risk for falls (>80%)  FGA = 15/30, < 19 = high risk fall   10/16/23: 5xSTS = 11.84 sec Berg = 43/56; 37-45 = Significant (>80%) fall risk  Pt uses cane indoor: 44 - 46.5 indicates significant to high fall risk Pt uses cane outdoor: 47 - 49.6 indicates significant to high fall risk   10/28/23: = 11.75 sec with single trekking pole; 12.40 sec w/o AD Gait speed = 2.79 ft/sec with single trekking pole; 2.66 ft/sec w/o AD  FGA = 18/30; < 19 = high risk fall    Standardized testing results as of discharge from latest PT episode (12/31/22): 5xSTS: 10.72 sec Berg: 48/56; 46-51 moderate risk for falls (>50%)  : 16.15 sec w/o AD Gait speed: 2.03 ft/sec FGA: 19/30; 19-24 = medium risk fall   GAIT: Distance walked: Clinic distances Assistive device utilized:  Single walking/trekking pole on R Level of assistance: SBA Gait pattern:  Increased sway, step through pattern, decreased stride length, trendelenburg, lateral hip instability, poor foot clearance- Right, and poor foot clearance- Left Comments: L>R foot slap   TODAY'S TREATMENT:   10/28/23 THERAPEUTIC EXERCISE: To improve strength and  endurance.  Demonstration, verbal and tactile cues throughout for technique.  Rec Bike - L3 x 6 (seat #2)  THERAPEUTIC ACTIVITIES: To improve functional performance.  Demonstration, verbal and tactile cues throughout for technique. = 11.75 sec with single trekking pole; 12.40 sec w/o AD Gait speed = 2.79 ft/sec with single trekking pole; 2.66 ft/sec w/o AD FGA = 18/30; < 19 = high risk fall   Functional Gait  Assessment  Gait Level Surface Walks 20 ft, slow speed, abnormal gait pattern, evidence for imbalance or deviates 10-15 in outside of the 12 in walkway width. Requires more than 7 sec to ambulate 20 ft.   Change in Gait Speed Able to change speed, demonstrates mild gait deviations, deviates 6-10 in outside of the 12 in walkway width, or no gait deviations, unable to achieve a major change in velocity, or uses a change in velocity, or uses an assistive device.   Gait with Horizontal Head Turns Performs head turns smoothly with slight change in gait velocity (eg, minor disruption to smooth gait path), deviates 6-10 in outside 12 in walkway width, or uses an assistive device.   Gait with Vertical Head Turns Performs task with slight change in gait velocity (eg, minor disruption to smooth gait path), deviates 6 - 10 in outside 12 in walkway width or uses assistive device   Gait and Pivot Turn Pivot turns safely in greater than 3 sec and stops with no loss of balance, or pivot turns safely within 3 sec and stops with mild imbalance, requires small steps to catch balance.   Step Over Obstacle Is able to step over 2 stacked shoe boxes taped together (9 in total height) without changing gait speed. No evidence of imbalance.   Gait with Narrow Base of Support Ambulates 4-7 steps.   Gait with Eyes Closed Walks 20 ft, slow speed, abnormal gait pattern, evidence for imbalance, deviates 10-15 in outside 12 in walkway width. Requires more than 9 sec to ambulate 20 ft.   Ambulating Backwards Walks 20 ft,  uses assistive device, slower speed, mild gait deviations, deviates 6-10 in outside 12 in walkway width.   Steps Alternating feet, must use rail.   Total Score 18   FGA comment: < 19 = high risk fall      NEUROMUSCULAR RE-EDUCATION: To improve balance, coordination, kinesthesia, posture, proprioception, and proximal LE strengthening  .  SLS + opp LE hip ABD isometric into ball on wall 10 x 5" bil, UE support on back of chair for balance SLS + opp LE hip ABD/ER isometric into ball on wall 10 x 5" bil, UE support on wall and back of chair for balance   10/23/23 THERAPEUTIC EXERCISE: To improve strength, endurance, and flexibility.  Demonstration, verbal and tactile cues throughout for technique.  Rec bike - L4 x 6 min (seat #  2) Hooklying LTR x 2 S/L open book stretch 10 x 5" bil  NEUROMUSCULAR RE-EDUCATION: To improve balance, coordination, kinesthetic sense, posture, and proprioception.  Quadruped alt UE flexion x 10 Quadruped alt LE extension x 10 Quadruped bird dog x 5 with peanut ball under torso Tall kneel hip hinge on Airex pad in front of mat table x 10 with light intermittent UE support on mat table Seated on dynadisc with Airex pad under feet: Alt LE march x 10 Alt LAQ x 10 Alt UE flexion + opp LE march x 10 RTB scap retraction + B shoulder row x 10 RTB scap retraction + B shoulder extension x 10 RTB pallof press x 10  SELF CARE: Provided education to prevent loss of gains achieved with physical therapy, to prevent future decline in function, and on ways to stay active when unable to go for normal daily walks .  Discussed options for increased activity in her home when unable to walk for exercise as well as ways to promote carryover of exercises such as use of recumbent bike - recommended restorator style pedal exerciser such as: Folding Exercise Bike Pedal Exerciser Portable Desk Bike with LCD Display for Arms and Legs Workout - [email or web address]://[email or web address]    10/16/23  THERAPEUTIC  EXERCISE: To improve strength and endurance.  Demonstration, verbal and tactile cues throughout for technique.  Rec bike - L4 x 6 min (seat #2)  THERAPEUTIC ACTIVITIES: To improve functional performance.  Demonstration, verbal and tactile cues throughout for technique. 5xSTS = 11.84 sec Berg = 43/56; 37-45 = Significant (>80%) fall risk  Pt uses cane indoor: 44 - 46.5 indicates significant to high fall risk Pt uses cane outdoor: 47 - 49.6 indicates significant to high fall risk   Berg Balance Test   Sit to Stand Able to stand without using hands and stabilize independently    Standing Unsupported Able to stand safely 2 minutes    Sitting with Back Unsupported but Feet Supported on Floor or Stool Able to sit safely and securely 2 minutes    Stand to Sit Sits safely with minimal use of hands    Transfers Able to transfer safely, minor use of hands    Standing Unsupported with Eyes Closed Able to stand 3 seconds    Standing Unsupported with Feet Together Able to place feet together independently and stand 1 minute safely    From Standing, Reach Forward with Outstretched Arm Can reach forward >12 cm safely (5")    From Standing Position, Pick up Object from Floor Able to pick up shoe, needs supervision    From Standing Position, Turn to Look Behind Over each Shoulder Looks behind one side only/other side shows less weight shift    Turn 360 Degrees Able to turn 360 degrees safely one side only in 4 seconds or less    Standing Unsupported, Alternately Place Feet on Step/Stool Able to stand independently and complete 8 steps >20 seconds    Standing Unsupported, One Foot in Front Needs help to step but can hold 15 seconds    Standing on One Leg Tries to lift leg/unable to hold 3 seconds but remains standing independently    Total Score 43    Berg comment: 37-45 = Significant (>80%) fall risk      NEUROMUSCULAR RE-EDUCATION: To improve balance, coordination, kinesthetic sense, posture,  proprioception, and reduce fall risk.  3/4 tandem stance 3 x 15-20" bil with intermittent single UE support on back of chair SLS with tip-toe support  on opp LE and intermittent single UE support on back of chair 3 x 15-20"  Standing march with looped YTB at midfeet 2 x 10 - cues for ankle DF during hip flexion to encourage increased toe clearance/reduce foot slap (and prevent band from slipping off)   PATIENT EDUCATION:  Education details: continue with current HEP Person educated: Patient Education method: Explanation Education comprehension: verbalized understanding  HOME EXERCISE PROGRAM: Access Code: [email or web address] URL: [email or web address] Date: 10/14/2023 Prepared by: Glenetta Hew  Exercises - Standing Gastroc Stretch at Counter  - 1-2 x daily - 7 x weekly - 3 reps - 30 sec hold - Standing Gastroc Stretch on Foam 1/2 Roll  - 1-2 x daily - 7 x weekly - 3 reps - 30 sec hold - Standing Hip Flexor Stretch  - 1-2 x daily - 7 x weekly - 3 reps - 30 sec hold - Hip Flexor Stretch with Strap on Table  - 1-2 x daily - 7 x weekly - 3 reps - 30 sec hold - Hooklying Hamstring Stretch with Strap  - 1-2 x daily - 7 x weekly - 3 reps - 30 sec hold - Supine Iliotibial Band Stretch with Strap  - 1-2 x daily - 7 x weekly - 3 reps - 30 sec hold - Seated Table Piriformis Stretch  - 1-2 x daily - 7 x weekly - 3 reps - 30 sec hold - Standing 3-way Hip with Walker  - 1 x daily - 3 x weekly - 2 sets - 10 reps - 3 sec hold - Hip Hiking on Step  - 1 x daily - 3 x weekly - 2 sets - 10 reps - 3 sec hold - Clam with Resistance  - 1 x daily - 3 x weekly - 2 sets - 10 reps - 3-5 sec hold - Seated March with Resistance  - 1 x daily - 3 x weekly - 2 sets - 10 reps - 3 sec hold - Corner Balance Feet Together: Eyes Open With Head Turns  - 1 x daily - 7 x weekly - 2 sets - 5 reps - Carioca with Counter Support  - 1 x daily - 3 x weekly - 2 sets - 10 reps - 3 sec hold - Side Stepping with Resistance at  Thighs and Counter Support  - 1 x daily - 3 x weekly - 2 sets - 10 reps - 3 sec hold - Forward Backward Monster Walk with Band at Thighs and Counter Support  - 1 x daily - 3 x weekly - 2 sets - 10 reps  Standing PWR! Moves   HEP from most recent PT episode: Access Code: [email or web address] URL: [email or web address] Date: 11/28/2022 Prepared by: Glenetta Hew   Exercises - Seated Heel Toe Raises  - 1 x daily - 7 x weekly - 2 sets - 10 reps - 3 sec hold - Standing Isometric Hip Abduction with Ball on Wall  - 1 x daily - 7 x weekly - 2 sets - 10 reps - 3 sec hold - Seated Transversus Abdominis Bracing  - 2 x daily - 7 x weekly - 2 sets - 10 reps - 3-5 sec hold - Seated Isometric Hip Abduction with Resistance  - 1 x daily - 3 x weekly - 2 sets - 10 reps - 3 sec hold - Seated March with Resistance  - 1 x daily - 3 x weekly - 2 sets - 10 reps - 3 sec hold - Seated Shoulder Row  with Anchored Resistance  - 1 x daily - 3 x weekly - 2 sets - 10 reps - 3-5 hold hold - Seated Single Arm Shoulder Row with Anchored Resistance  - 1 x daily - 3 x weekly - 2 sets - 10 reps - 3 sec hold - Seated Anti-Rotation Press With Anchored Resistance  - 1 x daily - 3 x weekly - 2 sets - 10 reps - 3 sec hold - Tall Kneeling Hip Hinge  - 1 x daily - 3-4 x weekly - 2 sets - 10 reps - 3 sec hold   Quadruped PWR! Up & Step   ASSESSMENT:  CLINICAL IMPRESSION: [name] Trujillo reports improving tolerance for walking in her neighborhood, able to walk 3 times around the block yesterday but with decreasing speed and increasing foot slap as distance progressed.  demonstrating improving gait speed both with (2.79 ft/sec) and without trekking pole (2.66 ft/sec), with overall gait speed now at a community ambulator level (>2.62 ft/sec).  Gains also noted on FGA with current score of 18/30 improved by 3 points from last testing, and 7 points better than baseline of 11/30, placing her at the cusp of the high to medium fall risk.   Majority of instability noted with gait still resulting from lateral hip instability, therefore continued to focus on lateral hip strengthening while promoting SLS stability.  Increasing need for UE support for balance noted with combination of hip abduction and ER isometric, with patient noting fatigue and requiring seated rest break upon completion of exercises.  [name] Trujillo continues to demonstrate progress towards her PT goals and remains motivated to further improve her strength, balance and quality of mobility.  She will benefit from continued skilled PT to address ongoing weakness and balance deficits to improve mobility and activity tolerance with improved stability and balance to reduce fall risk.    OBJECTIVE IMPAIRMENTS: Abnormal gait, decreased activity tolerance, decreased balance, decreased coordination, decreased endurance, decreased mobility, difficulty walking, decreased ROM, decreased strength, decreased safety awareness, increased fascial restrictions, impaired perceived functional ability, increased muscle spasms, impaired flexibility, impaired sensation, impaired tone, improper body mechanics, and postural dysfunction.   ACTIVITY LIMITATIONS: carrying, lifting, bending, sitting, standing, squatting, stairs, transfers, bed mobility, bathing, toileting, dressing, reach over head, hygiene/grooming, locomotion level, and caring for others  PARTICIPATION LIMITATIONS: meal prep, cleaning, laundry, driving, shopping, community activity, yard work, and church  PERSONAL FACTORS: Age, Past/current experiences, Time since onset of injury/illness/exacerbation, and 3+ comorbidities: L2-3 XLIF/PSF 05/24/22; C5-7 ACDF 09/21/20; idiopathic progressive neuropathy; hypthyroidism; DDD; remote h/o back surgery at age 62; h/o scoliosis; Takotsubo cardiomyopathy 01/10/22  are also affecting patient's functional outcome.   REHAB POTENTIAL: Good  CLINICAL DECISION MAKING: Evolving/moderate complexity  EVALUATION  COMPLEXITY: Moderate   GOALS: Goals reviewed with patient? Yes  SHORT TERM GOALS: Target date: 08/26/2023  Patient will be independent with initial HEP to improve outcomes and carryover.  Baseline:  Goal status: MET - 08/21/23  2.  Complete balance assessment and update LTG's as indicated.  Baseline:  Goal status: MET - 08/12/23   LONG TERM GOALS: Target date: 09/23/2023, extended to 11/13/2023  Patient will be independent with ongoing/advanced HEP for self-management at home.  Baseline:  Goal status: PARTIALLY MET - 09/11/23 - Met for current HEP; further updates anticipated  2.  Patient to demonstrate ability to achieve and maintain good spinal alignment/posturing and body mechanics needed for daily activities. Baseline:  Goal status: IN PROGRESS - 09/11/23 - Pt aware of good posture and body  mechanics but limited at times by impaired balance  3.  Patient will demonstrate functional pain free lumbar ROM to perform ADLs.   Baseline: Refer to above lumbar ROM table Goal status: IN PROGRESS - 09/30/23 - lumbar ROM improving but still mostly limited by balance  4.  Patient will demonstrate improved B proximal LE strength to >/= 4 to 4+/5 for improved stability and ease of mobility . Baseline: Refer to above LE MMT table Goal status: IN PROGRESS - 10/07/23 - continued gains noted in overall B hip strength  5.  Patient will demonstrate improved B ankle strength to >/= 3+ to 4-/5 for improved gait stability with decreased foot slap.  Baseline: Refer to above LE MMT table Goal status: IN PROGRESS - 10/07/23 - continued gains in overall ankle strength, but remains unable to initiate body weight resisted heel raises  6.  Patient will report >/= 60% on ABC scale to demonstrate improved balance confidence.  Baseline: 710 / 1600 = 44.4 % Goal status: IN PROGRESS - 09/18/23 - 470 / 1600 = 29.4 %  7. Patient will report </= 36% on Modified Oswestry to demonstrate improved functional ability with  decreased pain interference. Baseline: 24 / 50 = 48.0 % Goal status: IN PROGRESS - 09/18/23 - 26 / 50 = 52.0 %  8.  Patient will improve gait velocity to at least 2.62 ft/sec for improved gait efficiency and safety with community ambulation. Baseline: 2.31 ft/sec with single walking/trekking pole on R; 09/18/23 - 2.32 ft/sec with single hiking pole; 2.23 ft/sec w/o AD; 10/07/23 - 2.59 ft/sec with single hiking pole, 2.62 ft.sec w/o AD Goal status:  MET - 10/28/23 - 2.79 ft/sec with single trekking pole; 2.66 ft/sec w/o AD  9.  Patient will improve Berg score to >/= 39/56 (8 points) to improve safety and stability with ADLs in standing and reduce risk for falls.  Baseline: 31/56; 09/18/23 - 38/56 Goal status: MET & REVISED (see 9a. below) - 10/16/23 - 43/56  9a.  Patient will improve Berg score to >/= 47/56 to improve safety and stability with ADLs in standing and reduce risk for falls.  Baseline: 31/56 on eval; 09/18/23 - 38/56; 10/16/23 - 43/56 (previously 48/56 as of D/C from PT in 12/2022) Goal status: IN PROGRESS   10.  Patient will improve FGA score to >/= 15/30 (4 points) to improve gait stability and reduce risk for falls.  Baseline: 11/30 Goal status: MET & REVISED (see 10a. below) - 09/18/23 - 15/30  10a.  Patient will improve FGA score to >/= 19/30 (baseline as of prior D/C from PT) to improve gait stability and reduce risk for falls.  Baseline: 11/30 on eval, 15/30 as of 09/18/23 Goal status: IN PROGRESS - 10/28/23 - 18/30   PLAN:  PT FREQUENCY: 2x/week  PT DURATION: 8 weeks  PLANNED INTERVENTIONS: 97164- PT Re-evaluation, 97110-Therapeutic exercises, 97530- Therapeutic activity, 97112- Neuromuscular re-education, 97535- Self Care, [id number]- Manual therapy, 97116- Gait training, 97014- Electrical stimulation (unattended), [phone]- Electrical stimulation (manual), [phone]- Ionotophoresis 4mg /ml Dexamethasone, Patient/Family education, Balance training, Stair training, Taping, Dry Needling, Joint  mobilization, DME instructions, Cryotherapy, and Moist heat  PLAN FOR NEXT SESSION:  primary focus on balance training and gait stability; progress lumbopelvic/LE strengthening and core stabilization; review & HEP PRN   Marry Guan, PT 10/28/2023, 2:09 PM

## 2023-10-30 LAB — LAB REPORT - SCANNED
A1c: 5.5
EGFR: 75
TSH: 1.17 (ref 0.41–5.90)

## 2023-10-31 ENCOUNTER — Ambulatory Visit: Payer: Medicare PPO | Admitting: General Practice

## 2023-11-04 ENCOUNTER — Ambulatory Visit: Payer: Medicare PPO | Admitting: Physical Therapy

## 2023-11-04 ENCOUNTER — Encounter: Payer: Self-pay | Admitting: Physical Therapy

## 2023-11-04 DIAGNOSIS — R2689 Other abnormalities of gait and mobility: Secondary | ICD-10-CM

## 2023-11-04 DIAGNOSIS — M21371 Foot drop, right foot: Secondary | ICD-10-CM

## 2023-11-04 DIAGNOSIS — R2681 Unsteadiness on feet: Secondary | ICD-10-CM

## 2023-11-04 DIAGNOSIS — M21372 Foot drop, left foot: Secondary | ICD-10-CM

## 2023-11-04 DIAGNOSIS — M6281 Muscle weakness (generalized): Secondary | ICD-10-CM

## 2023-11-04 NOTE — Therapy (Signed)
 OUTPATIENT PHYSICAL THERAPY TREATMENT     Patient Name: [name] Trujillo MRN: [id number] DOB:[birth year], 83 y.o., female Today's Date: 11/04/2023  END OF SESSION:  PT End of Session - 11/04/23 1317     Visit Number 21    Date for PT Re-Evaluation 11/13/23    Authorization Type [employer]    Authorization Time Period 09/11/23 - 11/13/23    Authorization - Visit Number 14    Authorization - Number of Visits 15   7 additional visits added to initial 8   Progress Note Due on Visit 20    PT Start Time 1317    PT Stop Time 1400    PT Time Calculation (min) 43 min    Activity Tolerance Patient tolerated treatment well    Behavior During Therapy [facility] for tasks assessed/performed                  Past Medical History:  Diagnosis Date   High cholesterol    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   She had ACUTE TAKOTSUBO (STRESS) CARDIOMYOPATHY with elevated Troponin Levels - this would be considered "Demand Ischemia - Demand Infarction" & NOT associated with ACS/CAD.     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  - Cath with NO CAD. ==> RESOLVED: f/u Echo 03/2022: EF 60-65%.   Past Surgical History:  Procedure Laterality Date   APPENDECTOMY     21-18 yo   BACK SURGERY     Age 70   CESAREAN SECTION     ECTOPIC PREGNANCY SURGERY     LAPAROSCOPIC HYSTERECTOMY     LEFT HEART CATH AND CORONARY ANGIOGRAPHY N/A 01/09/2022   Procedure: LEFT HEART CATH AND CORONARY ANGIOGRAPHY;  Surgeon: Tonny Bollman, MD;  Location: [facility] INVASIVE CV LAB;  Service: CV:: Widely patent coronaries with mild nonobstructive LAD plaquing.  Right dominant system.  Normal LVEDP.  Based on clinical presentation,  findings are consistent with acute Takotsubo Cardiomyopathy Syndrome   POSTERIOR FUSION LUMBAR SPINE  05/24/2022   [facility], [place]; [name], MD): L2-L3 XLIF, L2-L3 POSTERIOR DECOMPRESSION AND FUSION   TRANSTHORACIC ECHOCARDIOGRAM  01/08/2022   Severely decreased LV function-EF 25 to 30%.  Mid to apical (mostly anterior) with normal basal motion.  GR 2 DD-elevated LAP.  Mildly dilated LA.  Aortic sclerosis with no stenosis.  No AI.  Normal MV with mild to moderate TR.  Mildly elevated RAP, and PAP (estimated 49 mg). If LAD CAD ruled out - consistent with Takotsubo CM Syndrome.   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    PCP: Mosetta Putt, MD   REFERRING PROVIDER: Rondel Oh, NP   REFERRING DIAG: Z98.1 (ICD-10-CM) - S/p lumbar spinal  fusion  Eval and treat for: s/p lumbar fusion, lower extremity strengthening  THERAPY DIAG:  Muscle weakness (generalized)  Other abnormalities of gait and mobility  Unsteadiness on feet  Foot drop, left  Foot drop, right  RATIONALE FOR EVALUATION AND TREATMENT: Rehabilitation  ONSET DATE: 05/24/2022 - L2-L3 XLIF/PSF   NEXT MD VISIT: 12/31/2023 with Marlaine Hind, MD (Surgeon)   SUBJECTIVE:                                                                                                                                                                                                         SUBJECTIVE  STATEMENT: [name] Trujillo reports she was able to walk 3x around the block yesterday, but with decreasing speed/pace and increased foot slap on latter laps.  PAIN: Are you having pain? No  PERTINENT HISTORY:  L2-3 XLIF/PSF 05/24/22; C5-7 ACDF 09/21/20; idiopathic progressive neuropathy; hypthyroidism; DDD; remote h/o back surgery at age 30; h/o scoliosis; Takotsubo cardiomyopathy 01/10/22   PRECAUTIONS: Fall  RED FLAGS: Bowel or bladder incontinence: Yes: chronic bladder issues, better bowel control since surgery  WEIGHT BEARING RESTRICTIONS: No  FALLS:  Has patient fallen in last 6 months? No  LIVING ENVIRONMENT: Lives with: lives with their spouse Lives in: House/apartment Stairs: Yes: External: 5-6 steps; on left going up Has following equipment at home: Environmental consultant - 2 wheeled, shower chair, and hiking/walking poles  OCCUPATION: Retired Runner, broadcasting/film/video   PLOF: Independent and Leisure: walking ~30 minutes 1x/day (3x around the court near where she lives); sewing/quilting; likes working in the yard but has not been able to do so due to her balance      PATIENT GOALS: "Improved balance (to feel more comfortable in a crowd and walk more normally)."   OBJECTIVE: (objective measures completed at initial evaluation unless otherwise dated)  DIAGNOSTIC FINDINGS:  06/30/23 - XR Lumbar spine: Stable postoperative changes of the lumbar spine with evidence of spinal fusion at L2-L3.   07/01/22 - Lumbar spine x-ray: Expected postoperative appearance related to L2-L3 fixation.    06/12/21 - Lumbar MRI: Postoperative and degenerative changes of the lumbar spine as described, similar compared to July 28, 2020. At L2-L3, there is moderate spinal canal stenosis due to disc bulging, endplate osteophytic spurring, facet and ligamentum flavum hypertrophy. Milder spinal canal stenosis is seen at L3-L4. There is multilevel neural foraminal stenosis.   PATIENT SURVEYS:  ABC scale 710 / 1600 = 44.4 % Modified  Oswestry 24 / 50 = 48.0 %  - Patient reports her answers are based on difficulty performing the activities related  to balance more so than pain  09/18/23: ABC Scale: 470 / 1600 = 29.4 % Modified Oswestry: 26 / 50 = 52.0 % - Patient reports her answers are based on difficulty performing the activities related to balance more so than pain  SCREENING FOR RED FLAGS: Bowel or bladder incontinence: Yes: chronic bladder issues, better bowel control since surgery Spinal tumors: No Cauda equina syndrome: No Compression fracture: No Abdominal aneurysm: No  COGNITION:  Overall cognitive status: Within functional limits for tasks assessed    SENSATION: Light touch: Impaired - top of R foot Proprioception: Impaired    MUSCLE LENGTH: Hamstrings: mild/mod tight L>R ITB: mild tight B Piriformis: mod tight L>R Hip IR: mod tight B Hip flexors: WFL Quads: mild tight R>L Heelcord: mild/mod tight B  POSTURE:  rounded shoulders, forward head, right pelvic obliquity, and scoliosis  LUMBAR ROM: *ROM assessment limited in standing due to balance impairments  Active  Eval * 09/30/23 * 11/04/23 *  Flexion Hands to mid shin Hands to mid shin Fingertips to toes with knees slightly flexed  Extension 50% limited 25% limited WFL  Right lateral flexion Hand to mid thigh Hand to lateral knee Hand to mid calf  Left lateral flexion Hand to mid thigh Hand to lateral knee - pulling in R flank Hand to mid calf  Right rotation 50% limited 30% limited 25% limited  Left rotation 50% limited 30% limited 10% limited  (Blank rows = not tested)  LOWER EXTREMITY ROM:    Grossly WFL other than limitations indicated above due to muscle tightness  LOWER EXTREMITY MMT:    MMT Right eval Left eval R 09/16/23 L 09/16/23 R 10/07/23 L 10/07/23 R 11/04/23 L 11/04/23  Hip flexion 3 3 4- 4- 4- 4 4+ 4  Hip extension 4 4 4+ 4+ 5 5 5 5   Hip abduction 4- 4- 4- 4 4 4+ 4+ 4+  Hip adduction 3+ 3+ 3+ 3+ 3+ 4- 4- 4  Hip internal rotation 4-  3+ 4+ 4 4+ 4+ 4+ 4+  Hip external rotation 3 3 4- 3+ 4- 4- 4+ 4+  Knee flexion 5 5 5 5 5 5 5 5   Knee extension 4+ 4+ 4+ 4+ 5 5 5 5   Ankle dorsiflexion 3 3- 3+ 3- 4- 3 4- 3  Ankle plantarflexion 2 2 2+ 2+ 2+ 4 in NWB 2+ 4 in NWB 2+ 4+ in NWB 2+ 4+ in NWB  Ankle inversion 3 3- 3+ 3- 3+ 3- 3+ 3-  Ankle eversion 4- 3+ 4 4- 4+ 4 4+ 4   (Blank rows = not tested)  FUNCTIONAL TESTS:  5 times sit to stand: 11.50 sec Timed up and go (TUG): 14.00 sec with single hiking pole; 12.25 sec w/o AD - 08/12/23 10 meter walk test: 14.22 sec with single walking/trekking pole; Gait speed = 2.31 ft/sec Berg Balance Scale: 31/56; < 36 high risk for falls (close to 100%) - 08/12/23 Functional gait assessment: 11/30; < 19 = high risk fall - 08/12/23   09/18/23 = 14.12 sec with single hiking pole; 14.69 sec w/o AD Gait speed = 2.32 ft/sec with single hiking pole; 2.23 ft/sec w/o AD Berg = 38/56, 37-45 significant risk for falls (>80%)  FGA = 15/30, < 19 = high risk fall   10/16/23: 5xSTS = 11.84 sec Berg = 43/56; 37-45 = Significant (>80%) fall risk  Pt uses cane indoor: 44 - 46.5 indicates significant to high fall risk Pt uses cane outdoor: 47 -  49.6 indicates significant to high fall risk   10/28/23: = 11.75 sec with single trekking pole; 12.40 sec w/o AD Gait speed = 2.79 ft/sec with single trekking pole; 2.66 ft/sec w/o AD FGA = 18/30; < 19 = high risk fall    Standardized testing results as of discharge from latest PT episode (12/31/22): 5xSTS: 10.72 sec Berg: 48/56; 46-51 moderate risk for falls (>50%)  : 16.15 sec w/o AD Gait speed: 2.03 ft/sec FGA: 19/30; 19-24 = medium risk fall   GAIT: Distance walked: Clinic distances Assistive device utilized:  Single walking/trekking pole on R Level of assistance: SBA Gait pattern:  Increased sway, step through pattern, decreased stride length, trendelenburg, lateral hip instability, poor foot clearance- Right, and poor foot clearance-  Left Comments: L>R foot slap   TODAY'S TREATMENT:   11/04/23 THERAPEUTIC EXERCISE: To improve strength and endurance.  Demonstration, verbal and tactile cues throughout for technique.  Rec Bike - L3 x 4 (seat #2) Blue TB provided for ankle PF with HEP  Lumbar ROM LE MMT  NEUROMUSCULAR RE-EDUCATION: To improve balance, coordination, kinesthesia, posture, proprioception, and reduce fall risk.  SLS + opp LE hip openers SLS + 5-way toe tap to colored dots Resisted YTB side-stepping with band at knees - guidance provided on how to move band distally to increase resistance as needed  SELF CARE: Provided education to reduce fall risk, to promote safe home environment, to prevent loss of gains achieved with physical therapy, and to prevent future decline in function.  Discussed plan for transition to HEP at end of current HEP, reviewing recommended ongoing exercises and self-progression of exercises as tolerated. Discussed ongoing ankle weakness including foot slap with ambulation and cautioned pt to f/u with MD or neurosurgery if she notices worsening of this or increased difficulty clearing her feet/stumbling while walking as she may eventually require AFOs to improve her gati safety and reduce fall risk.   10/28/23 THERAPEUTIC EXERCISE: To improve strength and endurance.  Demonstration, verbal and tactile cues throughout for technique.  Rec Bike - L3 x 6 (seat #2)  THERAPEUTIC ACTIVITIES: To improve functional performance.  Demonstration, verbal and tactile cues throughout for technique. = 11.75 sec with single trekking pole; 12.40 sec w/o AD Gait speed = 2.79 ft/sec with single trekking pole; 2.66 ft/sec w/o AD FGA = 18/30; < 19 = high risk fall   Functional Gait  Assessment  Gait Level Surface Walks 20 ft, slow speed, abnormal gait pattern, evidence for imbalance or deviates 10-15 in outside of the 12 in walkway width. Requires more than 7 sec to ambulate 20 ft.   Change in Gait  Speed Able to change speed, demonstrates mild gait deviations, deviates 6-10 in outside of the 12 in walkway width, or no gait deviations, unable to achieve a major change in velocity, or uses a change in velocity, or uses an assistive device.   Gait with Horizontal Head Turns Performs head turns smoothly with slight change in gait velocity (eg, minor disruption to smooth gait path), deviates 6-10 in outside 12 in walkway width, or uses an assistive device.   Gait with Vertical Head Turns Performs task with slight change in gait velocity (eg, minor disruption to smooth gait path), deviates 6 - 10 in outside 12 in walkway width or uses assistive device   Gait and Pivot Turn Pivot turns safely in greater than 3 sec and stops with no loss of balance, or pivot turns safely within 3 sec and stops with  mild imbalance, requires small steps to catch balance.   Step Over Obstacle Is able to step over 2 stacked shoe boxes taped together (9 in total height) without changing gait speed. No evidence of imbalance.   Gait with Narrow Base of Support Ambulates 4-7 steps.   Gait with Eyes Closed Walks 20 ft, slow speed, abnormal gait pattern, evidence for imbalance, deviates 10-15 in outside 12 in walkway width. Requires more than 9 sec to ambulate 20 ft.   Ambulating Backwards Walks 20 ft, uses assistive device, slower speed, mild gait deviations, deviates 6-10 in outside 12 in walkway width.   Steps Alternating feet, must use rail.   Total Score 18   FGA comment: < 19 = high risk fall      NEUROMUSCULAR RE-EDUCATION: To improve balance, coordination, kinesthesia, posture, proprioception, and proximal LE strengthening  .  SLS + opp LE hip ABD isometric into ball on wall 10 x 5" bil, UE support on back of chair for balance SLS + opp LE hip ABD/ER isometric into ball on wall 10 x 5" bil, UE support on wall and back of chair for balance   10/23/23 THERAPEUTIC EXERCISE: To improve strength, endurance, and flexibility.   Demonstration, verbal and tactile cues throughout for technique.  Rec bike - L4 x 6 min (seat #2) Hooklying LTR x 2 S/L open book stretch 10 x 5" bil  NEUROMUSCULAR RE-EDUCATION: To improve balance, coordination, kinesthetic sense, posture, and proprioception.  Quadruped alt UE flexion x 10 Quadruped alt LE extension x 10 Quadruped bird dog x 5 with peanut ball under torso Tall kneel hip hinge on Airex pad in front of mat table x 10 with light intermittent UE support on mat table Seated on dynadisc with Airex pad under feet: Alt LE march x 10 Alt LAQ x 10 Alt UE flexion + opp LE march x 10 RTB scap retraction + B shoulder row x 10 RTB scap retraction + B shoulder extension x 10 RTB pallof press x 10  SELF CARE: Provided education to prevent loss of gains achieved with physical therapy, to prevent future decline in function, and on ways to stay active when unable to go for normal daily walks .  Discussed options for increased activity in her home when unable to walk for exercise as well as ways to promote carryover of exercises such as use of recumbent bike - recommended restorator style pedal exerciser such as: Folding Exercise Bike Pedal Exerciser Portable Desk Bike with LCD Display for Arms and Legs Workout - [email or web address]    10/16/23  THERAPEUTIC EXERCISE: To improve strength and endurance.  Demonstration, verbal and tactile cues throughout for technique.  Rec bike - L4 x 6 min (seat #2)  THERAPEUTIC ACTIVITIES: To improve functional performance.  Demonstration, verbal and tactile cues throughout for technique. 5xSTS = 11.84 sec Berg = 43/56; 37-45 = Significant (>80%) fall risk  Pt uses cane indoor: 44 - 46.5 indicates significant to high fall risk Pt uses cane outdoor: 47 - 49.6 indicates significant to high fall risk   Berg Balance Test   Sit to Stand Able to stand without using hands and stabilize independently    Standing Unsupported Able to stand safely 2 minutes     Sitting with Back Unsupported but Feet Supported on Floor or Stool Able to sit safely and securely 2 minutes    Stand to Sit Sits safely with minimal use of hands    Transfers Able to transfer safely, minor use of  hands    Standing Unsupported with Eyes Closed Able to stand 3 seconds    Standing Unsupported with Feet Together Able to place feet together independently and stand 1 minute safely    From Standing, Reach Forward with Outstretched Arm Can reach forward >12 cm safely (5")    From Standing Position, Pick up Object from Floor Able to pick up shoe, needs supervision    From Standing Position, Turn to Look Behind Over each Shoulder Looks behind one side only/other side shows less weight shift    Turn 360 Degrees Able to turn 360 degrees safely one side only in 4 seconds or less    Standing Unsupported, Alternately Place Feet on Step/Stool Able to stand independently and complete 8 steps >20 seconds    Standing Unsupported, One Foot in Front Needs help to step but can hold 15 seconds    Standing on One Leg Tries to lift leg/unable to hold 3 seconds but remains standing independently    Total Score 43    Berg comment: 37-45 = Significant (>80%) fall risk      NEUROMUSCULAR RE-EDUCATION: To improve balance, coordination, kinesthetic sense, posture, proprioception, and reduce fall risk.  3/4 tandem stance 3 x 15-20" bil with intermittent single UE support on back of chair SLS with tip-toe support on opp LE and intermittent single UE support on back of chair 3 x 15-20"  Standing march with looped YTB at midfeet 2 x 10 - cues for ankle DF during hip flexion to encourage increased toe clearance/reduce foot slap (and prevent band from slipping off)   PATIENT EDUCATION:  Education details: continue with current HEP Person educated: Patient Education method: Explanation Education comprehension: verbalized understanding  HOME EXERCISE PROGRAM: Access Code: [email or web address] URL:  [email or web address] Date: 10/14/2023 Prepared by: Glenetta Hew  Exercises - Standing Gastroc Stretch at Counter  - 1-2 x daily - 7 x weekly - 3 reps - 30 sec hold - Standing Gastroc Stretch on Foam 1/2 Roll  - 1-2 x daily - 7 x weekly - 3 reps - 30 sec hold - Standing Hip Flexor Stretch  - 1-2 x daily - 7 x weekly - 3 reps - 30 sec hold - Hip Flexor Stretch with Strap on Table  - 1-2 x daily - 7 x weekly - 3 reps - 30 sec hold - Hooklying Hamstring Stretch with Strap  - 1-2 x daily - 7 x weekly - 3 reps - 30 sec hold - Supine Iliotibial Band Stretch with Strap  - 1-2 x daily - 7 x weekly - 3 reps - 30 sec hold - Seated Table Piriformis Stretch  - 1-2 x daily - 7 x weekly - 3 reps - 30 sec hold - Standing 3-way Hip with Walker  - 1 x daily - 3 x weekly - 2 sets - 10 reps - 3 sec hold - Hip Hiking on Step  - 1 x daily - 3 x weekly - 2 sets - 10 reps - 3 sec hold - Clam with Resistance  - 1 x daily - 3 x weekly - 2 sets - 10 reps - 3-5 sec hold - Seated March with Resistance  - 1 x daily - 3 x weekly - 2 sets - 10 reps - 3 sec hold - Corner Balance Feet Together: Eyes Open With Head Turns  - 1 x daily - 7 x weekly - 2 sets - 5 reps - Carioca with Counter Support  - 1 x daily -  3 x weekly - 2 sets - 10 reps - 3 sec hold - Side Stepping with Resistance at Thighs and Counter Support  - 1 x daily - 3 x weekly - 2 sets - 10 reps - 3 sec hold - Forward Backward Monster Walk with Band at Thighs and Counter Support  - 1 x daily - 3 x weekly - 2 sets - 10 reps  Standing PWR! Moves   HEP from most recent PT episode: Access Code: [email or web address] URL: [email or web address] Date: 11/28/2022 Prepared by: Glenetta Hew   Exercises - Seated Heel Toe Raises  - 1 x daily - 7 x weekly - 2 sets - 10 reps - 3 sec hold - Standing Isometric Hip Abduction with Ball on Wall  - 1 x daily - 7 x weekly - 2 sets - 10 reps - 3 sec hold - Seated Transversus Abdominis Bracing  - 2 x daily - 7 x  weekly - 2 sets - 10 reps - 3-5 sec hold - Seated Isometric Hip Abduction with Resistance  - 1 x daily - 3 x weekly - 2 sets - 10 reps - 3 sec hold - Seated March with Resistance  - 1 x daily - 3 x weekly - 2 sets - 10 reps - 3 sec hold - Seated Shoulder Row with Anchored Resistance  - 1 x daily - 3 x weekly - 2 sets - 10 reps - 3-5 hold hold - Seated Single Arm Shoulder Row with Anchored Resistance  - 1 x daily - 3 x weekly - 2 sets - 10 reps - 3 sec hold - Seated Anti-Rotation Press With Anchored Resistance  - 1 x daily - 3 x weekly - 2 sets - 10 reps - 3 sec hold - Tall Kneeling Hip Hinge  - 1 x daily - 3-4 x weekly - 2 sets - 10 reps - 3 sec hold   Quadruped PWR! Up & Step   ASSESSMENT:  CLINICAL IMPRESSION: Jayelyn feels like she will be ready to transition to her HEP at the end of her current POC next week.  LE MMT today revealing continued gains in proximal LE strength however ankle strength seems to have plateaued.  We reviewed her HEP to continue to work on hip and ankle strengthening, providing blue TB for ankle PF and instructing her in how to progress resistance/intensity of exercises as exercises become easier.  Given her ongoing foot/ankle weakness and foot slap with ambulation especially with fatigue, discussed potential need for AFOs in the future particularly if she finds herself having difficulty clearing her foot while ambulating - patient encouraged to seek follow-up with MD if she starts to notice this.  Will assess remaining outstanding goals and anticipate transition to HEP as of next visit.  OBJECTIVE IMPAIRMENTS: Abnormal gait, decreased activity tolerance, decreased balance, decreased coordination, decreased endurance, decreased mobility, difficulty walking, decreased ROM, decreased strength, decreased safety awareness, increased fascial restrictions, impaired perceived functional ability, increased muscle spasms, impaired flexibility, impaired sensation, impaired tone,  improper body mechanics, and postural dysfunction.   ACTIVITY LIMITATIONS: carrying, lifting, bending, sitting, standing, squatting, stairs, transfers, bed mobility, bathing, toileting, dressing, reach over head, hygiene/grooming, locomotion level, and caring for others  PARTICIPATION LIMITATIONS: meal prep, cleaning, laundry, driving, shopping, community activity, yard work, and church  PERSONAL FACTORS: Age, Past/current experiences, Time since onset of injury/illness/exacerbation, and 3+ comorbidities: L2-3 XLIF/PSF 05/24/22; C5-7 ACDF 09/21/20; idiopathic progressive neuropathy; hypthyroidism; DDD; remote h/o back surgery at age 65; h/o scoliosis; Takotsubo  cardiomyopathy 01/10/22  are also affecting patient's functional outcome.   REHAB POTENTIAL: Good  CLINICAL DECISION MAKING: Evolving/moderate complexity  EVALUATION COMPLEXITY: Moderate   GOALS: Goals reviewed with patient? Yes  SHORT TERM GOALS: Target date: 08/26/2023  Patient will be independent with initial HEP to improve outcomes and carryover.  Baseline:  Goal status: MET - 08/21/23  2.  Complete balance assessment and update LTG's as indicated.  Baseline:  Goal status: MET - 08/12/23   LONG TERM GOALS: Target date: 09/23/2023, extended to 11/13/2023  Patient will be independent with ongoing/advanced HEP for self-management at home.  Baseline:  Goal status: MET - 11/04/23   2.  Patient to demonstrate ability to achieve and maintain good spinal alignment/posturing and body mechanics needed for daily activities. Baseline:  Goal status: MET - 11/04/23   3.  Patient will demonstrate functional pain free lumbar ROM to perform ADLs.   Baseline: Refer to above lumbar ROM table Goal status: MET - 11/04/23 - lumbar ROM grossly WFL, still mostly limited by balance  4.  Patient will demonstrate improved B proximal LE strength to >/= 4 to 4+/5 for improved stability and ease of mobility . Baseline: Refer to above LE MMT table Goal  status: MET - 11/04/23 - met except L hip ADD 4-/5  5.  Patient will demonstrate improved B ankle strength to >/= 3+ to 4-/5 for improved gait stability with decreased foot slap.  Baseline: Refer to above LE MMT table Goal status: IN PROGRESS - 10/07/23 - continued gains in overall ankle strength, but remains unable to initiate body weight resisted heel raises  6.  Patient will report >/= 60% on ABC scale to demonstrate improved balance confidence.  Baseline: 710 / 1600 = 44.4 % Goal status: IN PROGRESS - 09/18/23 - 470 / 1600 = 29.4 %  7. Patient will report </= 36% on Modified Oswestry to demonstrate improved functional ability with decreased pain interference. Baseline: 24 / 50 = 48.0 % Goal status: IN PROGRESS - 09/18/23 - 26 / 50 = 52.0 %  8.  Patient will improve gait velocity to at least 2.62 ft/sec for improved gait efficiency and safety with community ambulation. Baseline: 2.31 ft/sec with single walking/trekking pole on R; 09/18/23 - 2.32 ft/sec with single hiking pole; 2.23 ft/sec w/o AD; 10/07/23 - 2.59 ft/sec with single hiking pole, 2.62 ft.sec w/o AD Goal status:  MET - 10/28/23 - 2.79 ft/sec with single trekking pole; 2.66 ft/sec w/o AD  9.  Patient will improve Berg score to >/= 39/56 (8 points) to improve safety and stability with ADLs in standing and reduce risk for falls.  Baseline: 31/56; 09/18/23 - 38/56 Goal status: MET & REVISED (see 9a. below) - 10/16/23 - 43/56  9a.  Patient will improve Berg score to >/= 47/56 to improve safety and stability with ADLs in standing and reduce risk for falls.  Baseline: 31/56 on eval; 09/18/23 - 38/56; 10/16/23 - 43/56 (previously 48/56 as of D/C from PT in 12/2022) Goal status: IN PROGRESS   10.  Patient will improve FGA score to >/= 15/30 (4 points) to improve gait stability and reduce risk for falls.  Baseline: 11/30 Goal status: MET & REVISED (see 10a. below) - 09/18/23 - 15/30  10a.  Patient will improve FGA score to >/= 19/30 (baseline as of  prior D/C from PT) to improve gait stability and reduce risk for falls.  Baseline: 11/30 on eval, 15/30 as of 09/18/23 Goal status: IN PROGRESS - 10/28/23 -  18/30   PLAN:  PT FREQUENCY: 2x/week  PT DURATION: 8 weeks  PLANNED INTERVENTIONS: 97164- PT Re-evaluation, 97110-Therapeutic exercises, 97530- Therapeutic activity, 97112- Neuromuscular re-education, 97535- Self Care, [id number]- Manual therapy, [id number]- Gait training, 97014- Electrical stimulation (unattended), [phone]- Electrical stimulation (manual), [phone]- Ionotophoresis 4mg /ml Dexamethasone, Patient/Family education, Balance training, Stair training, Taping, Dry Needling, Joint mobilization, DME instructions, Cryotherapy, and Moist heat  PLAN FOR NEXT SESSION:  primary focus on balance training and gait stability; progress lumbopelvic/LE strengthening and core stabilization; review & HEP PRN   Marry Guan, PT 11/04/2023, 2:01 PM

## 2023-11-11 ENCOUNTER — Ambulatory Visit: Payer: Self-pay | Attending: Registered" | Admitting: Physical Therapy

## 2023-11-11 ENCOUNTER — Encounter: Payer: Self-pay | Admitting: Physical Therapy

## 2023-11-11 DIAGNOSIS — M6281 Muscle weakness (generalized): Secondary | ICD-10-CM | POA: Diagnosis present

## 2023-11-11 DIAGNOSIS — M21372 Foot drop, left foot: Secondary | ICD-10-CM | POA: Diagnosis present

## 2023-11-11 DIAGNOSIS — R2689 Other abnormalities of gait and mobility: Secondary | ICD-10-CM | POA: Diagnosis present

## 2023-11-11 DIAGNOSIS — R2681 Unsteadiness on feet: Secondary | ICD-10-CM | POA: Insufficient documentation

## 2023-11-11 DIAGNOSIS — M21371 Foot drop, right foot: Secondary | ICD-10-CM | POA: Diagnosis present

## 2023-11-11 NOTE — Therapy (Signed)
 OUTPATIENT PHYSICAL THERAPY TREATMENT / DISCHARGE SUMMARY    Patient Name: [name] Trujillo MRN: [id number] DOB:1941-02-26, 83 y.o., female Today's Date: 11/11/2023  END OF SESSION:  PT End of Session - 11/11/23 1316     Visit Number 22    Date for PT Re-Evaluation 11/13/23    Authorization Type [employer]    Authorization Time Period 09/11/23 - 11/13/23    Authorization - Visit Number 15    Authorization - Number of Visits 15   7 additional visits added to initial 8   Progress Note Due on Visit 20    PT Start Time 1316    PT Stop Time 1402    PT Time Calculation (min) 46 min    Activity Tolerance Patient tolerated treatment well    Behavior During Therapy [facility] for tasks assessed/performed                   Past Medical History:  Diagnosis Date   High cholesterol    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   She had ACUTE TAKOTSUBO (STRESS) CARDIOMYOPATHY with elevated Troponin Levels - this would be considered "Demand Ischemia - Demand Infarction" & NOT associated with ACS/CAD.     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  - Cath with NO CAD. ==> RESOLVED: f/u Echo 03/2022: EF 60-65%.   Past Surgical History:  Procedure Laterality Date   APPENDECTOMY     46-18 yo   BACK SURGERY     Age 54   CESAREAN SECTION     ECTOPIC PREGNANCY SURGERY     LAPAROSCOPIC HYSTERECTOMY     LEFT HEART CATH AND CORONARY ANGIOGRAPHY N/A 01/09/2022   Procedure: LEFT HEART CATH AND CORONARY ANGIOGRAPHY;  Surgeon: Tonny Bollman, MD;  Location: [facility] INVASIVE CV LAB;  Service: CV:: Widely patent coronaries with mild nonobstructive LAD plaquing.  Right dominant system.  Normal LVEDP.  Based on  clinical presentation, findings are consistent with acute Takotsubo Cardiomyopathy Syndrome   POSTERIOR FUSION LUMBAR SPINE  05/24/2022   [facility], [place]; [name], MD): L2-L3 XLIF, L2-L3 POSTERIOR DECOMPRESSION AND FUSION   TRANSTHORACIC ECHOCARDIOGRAM  01/08/2022   Severely decreased LV function-EF 25 to 30%.  Mid to apical (mostly anterior) with normal basal motion.  GR 2 DD-elevated LAP.  Mildly dilated LA.  Aortic sclerosis with no stenosis.  No AI.  Normal MV with mild to moderate TR.  Mildly elevated RAP, and PAP (estimated 49 mg). If LAD CAD ruled out - consistent with Takotsubo CM Syndrome.   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    PCP: Mosetta Putt, MD   REFERRING PROVIDER: Rondel Oh, NP   REFERRING DIAG: Z98.1 (ICD-10-CM) -  S/p lumbar spinal fusion  Eval and treat for: s/p lumbar fusion, lower extremity strengthening  THERAPY DIAG:  Muscle weakness (generalized)  Other abnormalities of gait and mobility  Unsteadiness on feet  Foot drop, left  Foot drop, right  RATIONALE FOR EVALUATION AND TREATMENT: Rehabilitation  ONSET DATE: 05/24/2022 - L2-L3 XLIF/PSF   NEXT MD VISIT: 12/31/2023 with Marlaine Hind, MD (Surgeon)   SUBJECTIVE:                                                                                                                                                                                                          SUBJECTIVE STATEMENT: [name] reports she was able to walk 3x around the block yesterday, but with decreasing speed/pace and increased foot slap on latter laps.  PAIN: Are you having pain? No  PERTINENT HISTORY:  L2-3 XLIF/PSF 05/24/22; C5-7 ACDF 09/21/20; idiopathic progressive neuropathy; hypthyroidism; DDD; remote h/o back surgery at age 33; h/o scoliosis; Takotsubo cardiomyopathy 01/10/22   PRECAUTIONS: Fall  RED FLAGS: Bowel or bladder incontinence: Yes: chronic bladder issues, better bowel control since surgery  WEIGHT BEARING RESTRICTIONS: No  FALLS:  Has patient fallen in last 6 months? No  LIVING ENVIRONMENT: Lives with: lives with their spouse Lives in: House/apartment Stairs: Yes: External: 5-6 steps; on left going up Has following equipment at home: Environmental consultant - 2 wheeled, shower chair, and hiking/walking poles  OCCUPATION: Retired Runner, broadcasting/film/video   PLOF: Independent and Leisure: walking ~30 minutes 1x/day (3x around the court near where she lives); sewing/quilting; likes working in the yard but has not been able to do so due to her balance      PATIENT GOALS: "Improved balance (to feel more comfortable in a crowd and walk more normally)."   OBJECTIVE: (objective measures completed at initial evaluation unless otherwise dated)  DIAGNOSTIC FINDINGS:  06/30/23 - XR Lumbar spine: Stable postoperative changes of the lumbar spine with evidence of spinal fusion at L2-L3.   07/01/22 - Lumbar spine x-ray: Expected postoperative appearance related to L2-L3 fixation.    06/12/21 - Lumbar MRI: Postoperative and degenerative changes of the lumbar spine as described, similar compared to July 28, 2020. At L2-L3, there is moderate spinal canal stenosis due to disc bulging, endplate osteophytic spurring, facet and ligamentum flavum hypertrophy. Milder spinal canal stenosis is seen at L3-L4. There is multilevel neural foraminal stenosis.   PATIENT SURVEYS:  ABC scale 710 / 1600 = 44.4  % Modified Oswestry 24 / 50 = 48.0 %  - Patient reports her answers are based on difficulty performing  the activities related to balance more so than pain  09/18/23: ABC Scale: 470 / 1600 = 29.4 % Modified Oswestry: 26 / 50 = 52.0 % - Patient reports her answers are based on difficulty performing the activities related to balance more so than pain  11/11/23: ABC Scale: 960 / 1600 = 60.0 % Modified Oswestry: 10 / 50 = 20.0 %  SCREENING FOR RED FLAGS: Bowel or bladder incontinence: Yes: chronic bladder issues, better bowel control since surgery Spinal tumors: No Cauda equina syndrome: No Compression fracture: No Abdominal aneurysm: No  COGNITION:  Overall cognitive status: Within functional limits for tasks assessed    SENSATION: Light touch: Impaired - top of R foot Proprioception: Impaired    MUSCLE LENGTH: Hamstrings: mild/mod tight L>R ITB: mild tight B Piriformis: mod tight L>R Hip IR: mod tight B Hip flexors: WFL Quads: mild tight R>L Heelcord: mild/mod tight B  POSTURE:  rounded shoulders, forward head, right pelvic obliquity, and scoliosis  LUMBAR ROM: *ROM assessment limited in standing due to balance impairments  Active  Eval * 09/30/23 * 11/04/23 *  Flexion Hands to mid shin Hands to mid shin Fingertips to toes with knees slightly flexed  Extension 50% limited 25% limited WFL  Right lateral flexion Hand to mid thigh Hand to lateral knee Hand to mid calf  Left lateral flexion Hand to mid thigh Hand to lateral knee - pulling in R flank Hand to mid calf  Right rotation 50% limited 30% limited 25% limited  Left rotation 50% limited 30% limited 10% limited  (Blank rows = not tested)  LOWER EXTREMITY ROM:    Grossly WFL other than limitations indicated above due to muscle tightness  LOWER EXTREMITY MMT:    MMT Right eval Left eval R 09/16/23 L 09/16/23 R 10/07/23 L 10/07/23 R 11/04/23 L 11/04/23  Hip flexion 3 3 4- 4- 4- 4 4+ 4  Hip extension 4 4 4+ 4+ 5 5 5 5   Hip  abduction 4- 4- 4- 4 4 4+ 4+ 4+  Hip adduction 3+ 3+ 3+ 3+ 3+ 4- 4- 4  Hip internal rotation 4- 3+ 4+ 4 4+ 4+ 4+ 4+  Hip external rotation 3 3 4- 3+ 4- 4- 4+ 4+  Knee flexion 5 5 5 5 5 5 5 5   Knee extension 4+ 4+ 4+ 4+ 5 5 5 5   Ankle dorsiflexion 3 3- 3+ 3- 4- 3 4- 3  Ankle plantarflexion 2 2 2+ 2+ 2+ 4 in NWB 2+ 4 in NWB 2+ 4+ in NWB 2+ 4+ in NWB  Ankle inversion 3 3- 3+ 3- 3+ 3- 3+ 3-  Ankle eversion 4- 3+ 4 4- 4+ 4 4+ 4   (Blank rows = not tested)  FUNCTIONAL TESTS:  5 times sit to stand: 11.50 sec Timed up and go (TUG): 14.00 sec with single hiking pole; 12.25 sec w/o AD - 08/12/23 10 meter walk test: 14.22 sec with single walking/trekking pole; Gait speed = 2.31 ft/sec Berg Balance Scale: 31/56; < 36 high risk for falls (close to 100%) - 08/12/23 Functional gait assessment: 11/30; < 19 = high risk fall - 08/12/23   09/18/23 = 14.12 sec with single hiking pole; 14.69 sec w/o AD Gait speed = 2.32 ft/sec with single hiking pole; 2.23 ft/sec w/o AD Berg = 38/56, 37-45 significant risk for falls (>80%)  FGA = 15/30, < 19 = high risk fall   10/16/23: 5xSTS = 11.84 sec Berg = 43/56; 37-45 = Significant (>80%) fall  risk  Pt uses cane indoor: 44 - 46.5 indicates significant to high fall risk Pt uses cane outdoor: 47 - 49.6 indicates significant to high fall risk   10/28/23: = 11.75 sec with single trekking pole; 12.40 sec w/o AD Gait speed = 2.79 ft/sec with single trekking pole; 2.66 ft/sec w/o AD FGA = 18/30; < 19 = high risk fall    11/11/23: = 13.63 sec w/o AD Gait speed = 2.41 ft/sec w/o AD Berg = 45/56; scores of 37-45 indicate significant (>80%) fall risk  FGA = 19/30; scores of 19-24 indicate a medium risk for falls  Standardized testing results as of discharge from latest PT episode (12/31/22): 5xSTS: 10.72 sec Berg: 48/56; 46-51 moderate risk for falls (>50%)  : 16.15 sec w/o AD Gait speed: 2.03 ft/sec FGA: 19/30; 19-24 = medium risk fall    GAIT: Distance walked: Clinic distances Assistive device utilized:  Single walking/trekking pole on R Level of assistance: SBA Gait pattern:  Increased sway, step through pattern, decreased stride length, trendelenburg, lateral hip instability, poor foot clearance- Right, and poor foot clearance- Left Comments: L>R foot slap   TODAY'S TREATMENT:   11/11/23 THERAPEUTIC EXERCISE: To improve strength and endurance.  Demonstration, verbal and tactile cues throughout for technique.  Rec Bike - L5 x 6 min (seat #2) Final verbal HEP review  THERAPEUTIC ACTIVITIES: To improve functional performance.  Demonstration, verbal and tactile cues throughout for technique.  ABC Scale: 960 / 1600 = 60.0 %; indicates a moderate level of physical functioning Modified Oswestry: 10 / 50 = 20.0 %; indicates minimal perceived disability  = 13.63 sec w/o AD Gait speed = 2.41 ft/sec w/o AD Berg = 45/56; scores of 37-45 indicate significant (>80%) fall risk  FGA = 19/30; scores of 19-24 indicate a medium risk for falls  Berg Balance Test   Sit to Stand Able to stand without using hands and stabilize independently    Standing Unsupported Able to stand safely 2 minutes    Sitting with Back Unsupported but Feet Supported on Floor or Stool Able to sit safely and securely 2 minutes    Stand to Sit Sits safely with minimal use of hands    Transfers Able to transfer safely, minor use of hands    Standing Unsupported with Eyes Closed Able to stand 3 seconds    Standing Unsupported with Feet Together Able to place feet together independently and stand 1 minute safely    From Standing, Reach Forward with Outstretched Arm Can reach forward >12 cm safely (5")    From Standing Position, Pick up Object from Floor Able to pick up shoe safely and easily    From Standing Position, Turn to Look Behind Over each Shoulder Looks behind one side only/other side shows less weight shift    Turn 360 Degrees Able to turn 360  degrees safely one side only in 4 seconds or less    Standing Unsupported, Alternately Place Feet on Step/Stool Able to stand independently and complete 8 steps >20 seconds    Standing Unsupported, One Foot in Front Able to take small step independently and hold 30 seconds    Standing on One Leg Tries to lift leg/unable to hold 3 seconds but remains standing independently    Total Score 45    Berg comment: 37-45 = Significant (>80%) fall risk      Functional Gait  Assessment   Gait Level Surface Walks 20 ft, slow speed, abnormal gait pattern, evidence  for imbalance or deviates 10-15 in outside of the 12 in walkway width. Requires more than 7 sec to ambulate 20 ft.    Change in Gait Speed Able to change speed, demonstrates mild gait deviations, deviates 6-10 in outside of the 12 in walkway width, or no gait deviations, unable to achieve a major change in velocity, or uses a change in velocity, or uses an assistive device.    Gait with Horizontal Head Turns Performs head turns smoothly with slight change in gait velocity (eg, minor disruption to smooth gait path), deviates 6-10 in outside 12 in walkway width, or uses an assistive device.    Gait with Vertical Head Turns Performs head turns with no change in gait. Deviates no more than 6 in outside 12 in walkway width.    Gait and Pivot Turn Pivot turns safely in greater than 3 sec and stops with no loss of balance, or pivot turns safely within 3 sec and stops with mild imbalance, requires small steps to catch balance.    Step Over Obstacle Is able to step over 2 stacked shoe boxes taped together (9 in total height) without changing gait speed. No evidence of imbalance.    Gait with Narrow Base of Support Ambulates 4-7 steps.    Gait with Eyes Closed Walks 20 ft, slow speed, abnormal gait pattern, evidence for imbalance, deviates 10-15 in outside 12 in walkway width. Requires more than 9 sec to ambulate 20 ft.    Ambulating Backwards Walks 20 ft, uses  assistive device, slower speed, mild gait deviations, deviates 6-10 in outside 12 in walkway width.    Steps Alternating feet, must use rail.    Total Score 19    FGA comment: 19-24 = medium risk fall       11/04/23 THERAPEUTIC EXERCISE: To improve strength and endurance.  Demonstration, verbal and tactile cues throughout for technique.  Rec Bike - L3 x 4 min (seat #2) Blue TB provided for ankle PF with HEP  Lumbar ROM LE MMT  NEUROMUSCULAR RE-EDUCATION: To improve balance, coordination, kinesthesia, posture, proprioception, and reduce fall risk.  SLS + opp LE hip openers SLS + 5-way toe tap to colored dots Resisted YTB side-stepping with band at knees - guidance provided on how to move band distally to increase resistance as needed  SELF CARE: Provided education to reduce fall risk, to promote safe home environment, to prevent loss of gains achieved with physical therapy, and to prevent future decline in function.  Discussed plan for transition to HEP at end of current HEP, reviewing recommended ongoing exercises and self-progression of exercises as tolerated. Discussed ongoing ankle weakness including foot slap with ambulation and cautioned pt to f/u with MD or neurosurgery if she notices worsening of this or increased difficulty clearing her feet/stumbling while walking as she may eventually require AFOs to improve her gati safety and reduce fall risk.   10/28/23 THERAPEUTIC EXERCISE: To improve strength and endurance.  Demonstration, verbal and tactile cues throughout for technique.  Rec Bike - L3 x 6 (seat #2)  THERAPEUTIC ACTIVITIES: To improve functional performance.  Demonstration, verbal and tactile cues throughout for technique. = 11.75 sec with single trekking pole; 12.40 sec w/o AD Gait speed = 2.79 ft/sec with single trekking pole; 2.66 ft/sec w/o AD FGA = 18/30; < 19 = high risk fall   Functional Gait  Assessment  Gait Level Surface Walks 20 ft, slow speed,  abnormal gait pattern, evidence for imbalance or deviates 10-15 in  outside of the 12 in walkway width. Requires more than 7 sec to ambulate 20 ft.   Change in Gait Speed Able to change speed, demonstrates mild gait deviations, deviates 6-10 in outside of the 12 in walkway width, or no gait deviations, unable to achieve a major change in velocity, or uses a change in velocity, or uses an assistive device.   Gait with Horizontal Head Turns Performs head turns smoothly with slight change in gait velocity (eg, minor disruption to smooth gait path), deviates 6-10 in outside 12 in walkway width, or uses an assistive device.   Gait with Vertical Head Turns Performs task with slight change in gait velocity (eg, minor disruption to smooth gait path), deviates 6 - 10 in outside 12 in walkway width or uses assistive device   Gait and Pivot Turn Pivot turns safely in greater than 3 sec and stops with no loss of balance, or pivot turns safely within 3 sec and stops with mild imbalance, requires small steps to catch balance.   Step Over Obstacle Is able to step over 2 stacked shoe boxes taped together (9 in total height) without changing gait speed. No evidence of imbalance.   Gait with Narrow Base of Support Ambulates 4-7 steps.   Gait with Eyes Closed Walks 20 ft, slow speed, abnormal gait pattern, evidence for imbalance, deviates 10-15 in outside 12 in walkway width. Requires more than 9 sec to ambulate 20 ft.   Ambulating Backwards Walks 20 ft, uses assistive device, slower speed, mild gait deviations, deviates 6-10 in outside 12 in walkway width.   Steps Alternating feet, must use rail.   Total Score 18   FGA comment: < 19 = high risk fall      NEUROMUSCULAR RE-EDUCATION: To improve balance, coordination, kinesthesia, posture, proprioception, and proximal LE strengthening  .  SLS + opp LE hip ABD isometric into ball on wall 10 x 5" bil, UE support on back of chair for balance SLS + opp LE hip ABD/ER isometric  into ball on wall 10 x 5" bil, UE support on wall and back of chair for balance   PATIENT EDUCATION:  Education details: HEP review and recommended frequency for ongoing HEP at discharge to prevent loss of gains achieved with PT Person educated: Patient Education method: Explanation Education comprehension: verbalized understanding  HOME EXERCISE PROGRAM: Access Code: [email or web address] URL: [email or web address] Date: 10/14/2023 Prepared by: Glenetta Hew  Exercises - Standing Gastroc Stretch at Counter  - 1-2 x daily - 7 x weekly - 3 reps - 30 sec hold - Standing Gastroc Stretch on Foam 1/2 Roll  - 1-2 x daily - 7 x weekly - 3 reps - 30 sec hold - Standing Hip Flexor Stretch  - 1-2 x daily - 7 x weekly - 3 reps - 30 sec hold - Hip Flexor Stretch with Strap on Table  - 1-2 x daily - 7 x weekly - 3 reps - 30 sec hold - Hooklying Hamstring Stretch with Strap  - 1-2 x daily - 7 x weekly - 3 reps - 30 sec hold - Supine Iliotibial Band Stretch with Strap  - 1-2 x daily - 7 x weekly - 3 reps - 30 sec hold - Seated Table Piriformis Stretch  - 1-2 x daily - 7 x weekly - 3 reps - 30 sec hold - Standing 3-way Hip with Walker  - 1 x daily - 3 x weekly - 2 sets - 10 reps - 3 sec hold - Hip  Hiking on Step  - 1 x daily - 3 x weekly - 2 sets - 10 reps - 3 sec hold - Clam with Resistance  - 1 x daily - 3 x weekly - 2 sets - 10 reps - 3-5 sec hold - Seated March with Resistance  - 1 x daily - 3 x weekly - 2 sets - 10 reps - 3 sec hold - Corner Balance Feet Together: Eyes Open With Head Turns  - 1 x daily - 7 x weekly - 2 sets - 5 reps - Carioca with Counter Support  - 1 x daily - 3 x weekly - 2 sets - 10 reps - 3 sec hold - Side Stepping with Resistance at Thighs and Counter Support  - 1 x daily - 3 x weekly - 2 sets - 10 reps - 3 sec hold - Forward Backward Monster Walk with Band at Thighs and Counter Support  - 1 x daily - 3 x weekly - 2 sets - 10 reps  Standing PWR! Moves   HEP from most  recent PT episode: Access Code: [email or web address] URL: [email or web address] Date: 11/28/2022 Prepared by: Glenetta Hew   Exercises - Seated Heel Toe Raises  - 1 x daily - 7 x weekly - 2 sets - 10 reps - 3 sec hold - Standing Isometric Hip Abduction with Ball on Wall  - 1 x daily - 7 x weekly - 2 sets - 10 reps - 3 sec hold - Seated Transversus Abdominis Bracing  - 2 x daily - 7 x weekly - 2 sets - 10 reps - 3-5 sec hold - Seated Isometric Hip Abduction with Resistance  - 1 x daily - 3 x weekly - 2 sets - 10 reps - 3 sec hold - Seated March with Resistance  - 1 x daily - 3 x weekly - 2 sets - 10 reps - 3 sec hold - Seated Shoulder Row with Anchored Resistance  - 1 x daily - 3 x weekly - 2 sets - 10 reps - 3-5 hold hold - Seated Single Arm Shoulder Row with Anchored Resistance  - 1 x daily - 3 x weekly - 2 sets - 10 reps - 3 sec hold - Seated Anti-Rotation Press With Anchored Resistance  - 1 x daily - 3 x weekly - 2 sets - 10 reps - 3 sec hold - Tall Kneeling Hip Hinge  - 1 x daily - 3-4 x weekly - 2 sets - 10 reps - 3 sec hold   Quadruped PWR! Up & Step   ASSESSMENT:  CLINICAL IMPRESSION: Loistine reports she feels steadier with decreased reliance on reaching for furniture or walls to stabilize herself when ambulating or standing.  Balance confidence per ABC scale has improved to 60% indicating a moderate level of physical functioning.  Modified Oswestry reduced to 10/50 or 20% indicating minimal perceived disability related to her back.  She has demonstrated significant gains across all standardized balance testing.  Gait speed has increased from 2.31 ft/sec up to 2.79 ft/sec, indicating speed consistent with community ambulation.  Berg improved from 31/56 to 45/56, indicating a reduction in fall risk from high to significant.  FGA improved from 11/30 to 19/30, indicating a reduction in fall risk from high to medium.  Recent LE MMT revealed continued gains in proximal LE strength however  ankle strength seems to have plateaued.  We have reviewed her HEP in recent visits and she feels comfortable with all exercises, denying need for further  review today.  PT goals now met or partially met and Garlene feels ready to transition to her HEP, therefore will proceed with discharge from physical therapy for this episode.  OBJECTIVE IMPAIRMENTS: Abnormal gait, decreased activity tolerance, decreased balance, decreased coordination, decreased endurance, decreased mobility, difficulty walking, decreased ROM, decreased strength, decreased safety awareness, increased fascial restrictions, impaired perceived functional ability, increased muscle spasms, impaired flexibility, impaired sensation, impaired tone, improper body mechanics, and postural dysfunction.   ACTIVITY LIMITATIONS: carrying, lifting, bending, sitting, standing, squatting, stairs, transfers, bed mobility, bathing, toileting, dressing, reach over head, hygiene/grooming, locomotion level, and caring for others  PARTICIPATION LIMITATIONS: meal prep, cleaning, laundry, driving, shopping, community activity, yard work, and church  PERSONAL FACTORS: Age, Past/current experiences, Time since onset of injury/illness/exacerbation, and 3+ comorbidities: L2-3 XLIF/PSF 05/24/22; C5-7 ACDF 09/21/20; idiopathic progressive neuropathy; hypthyroidism; DDD; remote h/o back surgery at age 14; h/o scoliosis; Takotsubo cardiomyopathy 01/10/22  are also affecting patient's functional outcome.   REHAB POTENTIAL: Good  CLINICAL DECISION MAKING: Evolving/moderate complexity  EVALUATION COMPLEXITY: Moderate   GOALS: Goals reviewed with patient? Yes  SHORT TERM GOALS: Target date: 08/26/2023  Patient will be independent with initial HEP to improve outcomes and carryover.  Baseline:  Goal status: MET - 08/21/23  2.  Complete balance assessment and update LTG's as indicated.  Baseline:  Goal status: MET - 08/12/23   LONG TERM GOALS: Target date:  09/23/2023, extended to 11/13/2023  Patient will be independent with ongoing/advanced HEP for self-management at home.  Baseline:  Goal status: MET - 11/04/23   2.  Patient to demonstrate ability to achieve and maintain good spinal alignment/posturing and body mechanics needed for daily activities. Baseline:  Goal status: MET - 11/04/23   3.  Patient will demonstrate functional pain free lumbar ROM to perform ADLs.   Baseline: Refer to above lumbar ROM table Goal status: MET - 11/04/23 - lumbar ROM grossly WFL, still mostly limited by balance  4.  Patient will demonstrate improved B proximal LE strength to >/= 4 to 4+/5 for improved stability and ease of mobility . Baseline: Refer to above LE MMT table Goal status: MET - 11/04/23 - met except L hip ADD 4-/5  5.  Patient will demonstrate improved B ankle strength to >/= 3+ to 4-/5 for improved gait stability with decreased foot slap.  Baseline: Refer to above LE MMT table Goal status: PARTIALLY MET - 11/04/23 - partially met for R ankle, ongoing L ankle weakness and remains unable to initiate body weight resisted heel raises   6.  Patient will report >/= 60% on ABC scale to demonstrate improved balance confidence.  Baseline: 710 / 1600 = 44.4 %; 09/18/23 - 470 / 1600 = 29.4 % Goal status: MET - 11/11/23 - 960 / 1600 = 60.0 %  7. Patient will report </= 36% on Modified Oswestry to demonstrate improved functional ability with decreased pain interference. Baseline: 24 / 50 = 48.0 %;  09/18/23 - 26 / 50 = 52.0 % Goal status: MET - 11/11/23 - 10 / 50 = 20.0 %  8.  Patient will improve gait velocity to at least 2.62 ft/sec for improved gait efficiency and safety with community ambulation. Baseline: 2.31 ft/sec with single walking/trekking pole on R; 09/18/23 - 2.32 ft/sec with single hiking pole; 2.23 ft/sec w/o AD; 10/07/23 - 2.59 ft/sec with single hiking pole, 2.62 ft.sec w/o AD Goal status:  MET - 10/28/23 - 2.79 ft/sec with single trekking pole; 2.66  ft/sec w/o AD  9.  Patient will improve Berg score to >/= 39/56 (8 points) to improve safety and stability with ADLs in standing and reduce risk for falls.  Baseline: 31/56; 09/18/23 - 38/56 Goal status: MET & REVISED (see 9a. below) - 10/16/23 - 43/56  9a.  Patient will improve Berg score to >/= 47/56 to improve safety and stability with ADLs in standing and reduce risk for falls.  Baseline: 31/56 on eval; 09/18/23 - 38/56; 10/16/23 - 43/56 (previously 48/56 as of D/C from PT in 12/2022) Goal status: PARTIALLY MET - 11/11/23 - 45/56 - continued progress from last assessment  10.  Patient will improve FGA score to >/= 15/30 (4 points) to improve gait stability and reduce risk for falls.  Baseline: 11/30 Goal status: MET & REVISED (see 10a. below) - 09/18/23 - 15/30  10a.  Patient will improve FGA score to >/= 19/30 (baseline as of prior D/C from PT) to improve gait stability and reduce risk for falls.  Baseline: 11/30 on eval, 15/30 as of 09/18/23, 10/28/23 - 18/30 Goal status: MET - 11/11/23 - 19/30  PLAN:  PT FREQUENCY: 2x/week  PT DURATION: 8 weeks  PLANNED INTERVENTIONS: 97164- PT Re-evaluation, 97110-Therapeutic exercises, 97530- Therapeutic activity, 97112- Neuromuscular re-education, 97535- Self Care, 47829- Manual therapy, 97116- Gait training, 97014- Electrical stimulation (unattended), [phone]- Electrical stimulation (manual), 97033- Ionotophoresis 4mg /ml Dexamethasone, Patient/Family education, Balance training, Stair training, Taping, Dry Needling, Joint mobilization, DME instructions, Cryotherapy, and Moist heat  PLAN FOR NEXT SESSION: Transition to HEP + discharge from PT    PHYSICAL THERAPY DISCHARGE SUMMARY  Visits from Start of Care: 22  Current functional level related to goals / functional outcomes: Refer to above clinical impression and goal assessment.    Remaining deficits: As above.  Patient is aware that she may request orders to return to PT from her PCP in the event that  she notes declining function.   Education / Equipment: HEP  Patient agrees to discharge. Patient goals were met or partially met. Patient is being discharged due to being pleased with the current functional level.   Marry Guan, PT 11/11/2023, 5:45 PM

## 2023-11-13 NOTE — Progress Notes (Deleted)
 Cardiology Clinic Note   Patient Name: [name] Trujillo Date of Encounter: 11/13/2023  Primary Care Provider:  Mosetta Putt, MD Primary Cardiologist:  Bryan Lemma, MD  Patient Profile    [name] Trujillo 83 year old female presents to the clinic today for follow-up evaluation of her Takotsubo cardiomyopathy, hyperlipidemia, and fatigue.  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   She had ACUTE TAKOTSUBO (STRESS) CARDIOMYOPATHY with elevated Troponin Levels - this would be considered "Demand Ischemia - Demand Infarction" & NOT associated with ACS/CAD.     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  - Cath with NO CAD. ==> RESOLVED: f/u Echo 03/2022: EF 60-65%.   Past Surgical History:  Procedure Laterality Date   APPENDECTOMY     46-18 yo   BACK SURGERY     Age 19   CESAREAN SECTION     ECTOPIC PREGNANCY SURGERY     LAPAROSCOPIC HYSTERECTOMY     LEFT HEART CATH AND CORONARY ANGIOGRAPHY N/A 01/09/2022   Procedure: LEFT HEART CATH AND CORONARY ANGIOGRAPHY;  Surgeon: Tonny Bollman, MD;  Location: [facility] INVASIVE CV LAB;  Service: CV:: Widely patent coronaries with mild nonobstructive LAD plaquing.  Right dominant system.  Normal LVEDP.  Based on clinical presentation, findings are consistent with acute Takotsubo Cardiomyopathy Syndrome   POSTERIOR FUSION LUMBAR SPINE  05/24/2022   [facility], [place]; [name], MD): L2-L3 XLIF, L2-L3 POSTERIOR DECOMPRESSION AND FUSION   TRANSTHORACIC ECHOCARDIOGRAM  01/08/2022   Severely decreased LV function-EF 25 to 30%.  Mid to apical (mostly  anterior) with normal basal motion.  GR 2 DD-elevated LAP.  Mildly dilated LA.  Aortic sclerosis with no stenosis.  No AI.  Normal MV with mild to moderate TR.  Mildly elevated RAP, and PAP (estimated 49 mg). If LAD CAD ruled out - consistent with Takotsubo CM Syndrome.   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  She was admitted 5/23 with Takotsubo cardiomyopathy.  Her troponins were elevated and she was diagnosed with type II MI.  Her event occurred after watching her husband fall in the driveway.  This was very concerning to her.  She developed chest tightness and radiating to her jaw.  She also had left arm and shoulder discomfort.  Her echocardiogram at that time showed an EF of 25-30%.  She was started on metoprolol, losartan, and spironolactone.  Follow-up echocardiogram 8/23 showed an EF of 60 to 65%, mild MR, and her metoprolol was decreased to 12.5 mg daily.  She followed up with Dr. Herbie Baltimore 01/20/2023.  During that time she was doing  well.  She was walking with a cane for balance.  She was using a wheelchair for long distance travel.  She was trying to stay as active as possible.  She was still working with physical therapy.  She was asymptomatic from a cardiac standpoint.  She noted that when she would walk during the day with her cane she was not noticing shortness of breath or chest discomfort.  She felt that she was more active and less short of breath.  Follow-up was planned for 12 months.  She contacted the clinic on  09/23/2023.  She reported 2 to 3 weeks of shortness of breath.  She notices this with increased physical activity.  She also reported fatigue.  She was added to my schedule for today.  She presents to the clinic today for evaluation and states over the past few weeks she has noticed more shortness of breath.  She has also noticed some indigestion.  She also feels that she has been somewhat dizzy and feels that she occasionally needs to lay down.  We reviewed her previous cardiac history.  Her blood pressure today is well-controlled at 130/60.  Her EKG today shows normal sinus rhythm 89 bpm.  She presents with her daughter.  We used shared decision making to decide to proceed with CBC, CMP, thyroid panel, and echocardiogram.  I will have her maintain her physical activity.  She is currently doing physical therapy and walking half mile per day on graded incline..  Today she denies chest pain,  palpitations, melena, hematuria, hemoptysis, diaphoresis, weakness, presyncope, syncope, orthopnea, and PND.     Home Medications    Prior to Admission medications   Medication Sig Start Date End Date Taking? Authorizing Provider  acetaminophen (TYLENOL) 325 MG tablet Take 975 mg by mouth daily as needed for moderate pain.    [provider]  Carboxymethylcellulose Sodium (REFRESH TEARS OP) Place 2 drops into both eyes 3 (three) times daily.    [provider]  levothyroxine (SYNTHROID) 88 MCG tablet Take 88 mcg by mouth daily before breakfast.    [provider]  losartan (COZAAR) 25 MG tablet Take 1/2 tablet (12.5 mg total) by mouth daily. 04/08/23   [facility], Roe Rutherford, PA  metoprolol succinate (TOPROL XL) 25 MG 24 hr tablet Take 0.5 tablets (12.5 mg total) by mouth daily. 05/20/23   Marykay Lex, MD  rosuvastatin (CRESTOR) 10 MG tablet Take 1 tablet (10 mg total) by mouth at bedtime. Patient not taking: Reported on 07/29/2023 04/09/22   Marcelino Duster, PA  solifenacin  (VESICARE) 10 MG tablet Take 10 mg by mouth daily.    [provider]  vitamin B-12 (CYANOCOBALAMIN) 1000 MCG tablet Take 1,000 mcg by mouth daily. Patient not taking: Reported on 07/29/2023    [provider]    Family History    Family History  Problem Relation Age of Onset   Stroke Mother    Heart attack Father    Alcohol abuse Father    Neuropathy Neg Hx    She indicated that her mother is deceased. She indicated that her father is deceased. She indicated that her maternal grandmother is deceased. She indicated that her maternal grandfather is deceased. She indicated that her paternal grandmother is deceased. She indicated that her paternal grandfather is deceased. She indicated that the status of her neg hx is unknown.  Social History    Social History   Socioeconomic History   Marital status: Married  Spouse name: Not on file   Number of children: 7   Years of education: 16   Highest education level: Bachelor's degree (e.g., BA, AB, BS)  Occupational History   Occupation: Retired  Tobacco Use   Smoking status: Never   Smokeless tobacco: Never  Substance and Sexual Activity   Alcohol use: Yes    Comment: once or twice a month   Drug use: No   Sexual activity: Not on file  Other Topics Concern   Not on file  Social History Narrative   Lives at home w/ her husband    Right-handed   Caffeine: 2 cups of coffee in the morning   Social Drivers of Health   Financial Resource Strain: Low Risk  (04/08/2021)   Received from [facility] and [facility], [facility] and [facility]   Overall Financial Resource Strain (CARDIA)    Difficulty of Paying Living Expenses: Not very hard  Food Insecurity: No Food Insecurity (04/08/2021)   Received from [facility] and [facility], [facility] and [facility]   Hunger Vital Sign    Worried About Programme researcher, broadcasting/film/video in the Last Year: Never true    Ran Out of Food in the  Last Year: Never true  Transportation Needs: No Transportation Needs (04/08/2021)   Received from [facility] and [facility], [facility] and [facility]   PRAPARE - Transportation    In the past 12 months, has lack of transportation kept you from medical appointments or from getting medications?: No    In the past 12 months, has lack of transportation kept you from meetings, work, or from getting things needed for daily living?: No  Physical Activity: Sufficiently Active (04/08/2021)   Received from [facility] and [facility], [facility] and [facility]   Exercise Vital Sign    Days of Exercise per Week: 6 days    Minutes of Exercise per Session: 50 min  Stress: No Stress Concern Present (04/08/2021)   Received from [facility] and [facility], [facility] and [facility]   [employer] of Occupational Health - Occupational Stress Questionnaire    Feeling of Stress : Only a little  Social Connections: Socially Integrated (04/08/2021)   Received from [facility] and [facility], [facility] and [facility]   Social Connection and Isolation Panel [facility]    Frequency of Communication with Friends and Family: More than three times a week    Frequency of Social Gatherings with Friends and Family: Once a week    Attends Religious Services: More than 4 times per year    Active Member of [employer] or Organizations: Yes    Attends Engineer, structural: More than 4 times per year    Marital Status: Married  Catering manager Violence: Not At Risk (04/08/2021)   Received from [facility] and [facility], [facility] and [facility]   Humiliation, Afraid, Rape, and Kick questionnaire    Fear of Current or Ex-Partner: No    Emotionally Abused: No    Physically Abused: No    Sexually Abused: No     Review of Systems    General:  No chills, fever, night sweats or weight changes.   Cardiovascular:  No chest pain, dyspnea on exertion, edema, orthopnea, palpitations, paroxysmal nocturnal dyspnea. Dermatological: No rash, lesions/masses Respiratory: No cough, dyspnea Urologic: No hematuria, dysuria Abdominal:   No  nausea, vomiting, diarrhea, bright red blood per rectum, melena, or hematemesis Neurologic:  No visual changes, wkns, changes in mental status. All other systems reviewed and are otherwise negative except as noted above.  Physical Exam    VS:  There were no vitals taken for this visit. , BMI There is no height or weight on file to calculate BMI. GEN: Well nourished, well developed, in no acute distress. HEENT: normal. Neck: Supple, no JVD, carotid bruits, or masses. Cardiac: RRR, no murmurs, rubs, or gallops. No clubbing, cyanosis, edema.  Radials/DP/PT 2+ and equal bilaterally.  Respiratory:  Respirations regular and unlabored, clear to auscultation bilaterally. GI: Soft, nontender, nondistended, BS + x 4. MS: no deformity or atrophy. Skin: warm and dry, no rash. Neuro:  Strength and sensation are intact. Psych: Normal affect.  Accessory Clinical Findings    Recent Labs: 10/03/2023: BUN 23; Creatinine, Ser 0.86; Hemoglobin 13.3; Platelets 297; Potassium 4.6; Sodium 142; TSH 1.010   Recent Lipid Panel    Component Value Date/Time   CHOL 144 01/08/2022 0251   TRIG 25 01/08/2022 0251   HDL 58 01/08/2022 0251   CHOLHDL 2.5 01/08/2022 0251   VLDL 5 01/08/2022 0251   LDLCALC 81 01/08/2022 0251    No BP recorded.  {Refresh Note OR Click here to enter BP  :1}***    ECG personally reviewed by me today-      Echocardiogram 04/07/23  IMPRESSIONS   1. Left ventricular ejection fraction, by estimation, is 60 to 65%. The left ventricle has normal function. The left ventricle has no regional wall motion abnormalities. Left ventricular diastolic parameters were normal. 2. Right ventricular systolic function is normal. The right ventricular size is  normal. Tricuspid regurgitation signal is inadequate for assessing PA pressure. 3. The mitral valve is normal in structure. Mild to moderate mitral valve regurgitation. No evidence of mitral stenosis. 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 4. The aortic valve is tricuspid. There is mild calcification of the aortic valve. Aortic valve regurgitation is not visualized. Aortic valve sclerosis/calcification is present, without any evidence of aortic stenosis. 5. The inferior vena cava is normal in size with greater than 50% respiratory variability, suggesting right atrial pressure of 3 mmHg.  Comparison(s): No significant change from prior study.  FINDINGS Left Ventricle: Left ventricular ejection fraction, by estimation, is 60 to 65%. The left ventricle has normal function. The left ventricle has no regional wall motion abnormalities. Global longitudinal strain performed but not reported based on interpreter judgement due to suboptimal tracking. The left ventricular internal cavity size was normal in size. There is no left ventricular hypertrophy. Left ventricular diastolic parameters were normal.  Right Ventricle: The right ventricular size is normal. No increase in right ventricular wall thickness. Right ventricular systolic function is normal. Tricuspid regurgitation signal is inadequate for assessing PA pressure.  Left Atrium: Left atrial size was normal in size.  Right Atrium: Right atrial size was normal in size.  Pericardium: There is no evidence of pericardial effusion.  Mitral Valve: Although there is a prominent jet of mitral insufficiency on color Doppler, this occurs only briefly at end-systole. Overall mitral insufficiency is mild-to-moderate. The mitral valve is normal in structure. 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 There is mild thickening of the anterior and posterior mitral valve leaflet(s). Mild to  moderate mitral valve regurgitation, with centrally-directed jet. No evidence of mitral valve stenosis.  Tricuspid Valve: The tricuspid  valve is normal in structure. Tricuspid valve regurgitation is not demonstrated. No evidence of tricuspid stenosis.  Aortic Valve: The aortic valve is tricuspid. There is mild calcification of the aortic valve. Aortic valve regurgitation is not visualized. Aortic valve sclerosis/calcification is present, without any evidence of aortic stenosis.  Pulmonic Valve: The pulmonic valve was normal in structure. Pulmonic valve regurgitation is not visualized. No evidence of pulmonic stenosis.  Aorta: The aortic root is normal in size and structure.  Venous: The inferior vena cava is normal in size with greater than 50% respiratory variability, suggesting right atrial pressure of 3 mmHg.  IAS/Shunts: No atrial level shunt detected by color flow Doppler.     Assessment & Plan   1.  DOE-contacted cardiology clinic and reported that over the prior 2 to 3 weeks she was noticing more shortness of breath and fatigue.  She denied weight gain.  She was sleeping well.  She denied dietary changes.  She denied early satiety.  Previous echocardiogram 7/24 reassuring.  Details above. Repeat complete echocardiogram Heart healthy low-sodium diet Order CBC, CMP  Takotsubo cardiomyopathy, history of myocardial infarction due to demand ischemia-previously noted to have resolved on echocardiogram 04/07/2023.  EF was noted to be 60-65%, mild-moderate mitral valve regurgitation was noted.  And no significant changes from previous study.  Hyperlipidemia-LDL 81 on 01/08/22.  Goal less than 100. Continue statin therapy High-fiber diet Maintain physical activity  Myxomatous degenerative mitral valve-most recent echocardiogram showed mild-moderate mitral valve regurgitation with no evidence of mitral valve stenosis.  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 Continue to monitor  PVCs-EKG today shows nsr 89 bpm.  Denies episodes of lightheadedness, presyncope or syncope.  Cardiac unaware.  Disposition: Follow-up with Dr. Herbie Baltimore or me in 1-2 months.   Thomasene Ripple. Kolt Mcwhirter NP-C     11/13/2023, 1:06 PM [facility] [address] Office [phone] Fax [phone]    I spent 14*** minutes examining this patient, reviewing medications, and using patient centered shared decision making involving their cardiac care.   I spent greater than 20 minutes reviewing their past medical history,  medications, and prior cardiac tests.

## 2023-11-17 ENCOUNTER — Ambulatory Visit: Payer: Medicare PPO | Admitting: General Practice

## 2023-12-09 NOTE — Progress Notes (Deleted)
 Cardiology Clinic Note   Patient Name: [name] Trujillo Date of Encounter: 12/09/2023  Primary Care Provider:  Mosetta Putt, MD Primary Cardiologist:  Bryan Lemma, MD  Patient Profile    [name] Trujillo 83 year old female presents to the clinic today for follow-up evaluation of her Takotsubo cardiomyopathy, hyperlipidemia, and fatigue.  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   She had ACUTE TAKOTSUBO (STRESS) CARDIOMYOPATHY with elevated Troponin Levels - this would be considered "Demand Ischemia - Demand Infarction" & NOT associated with ACS/CAD.     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  - Cath with NO CAD. ==> RESOLVED: f/u Echo 03/2022: EF 60-65%.   Past Surgical History:  Procedure Laterality Date   APPENDECTOMY     38-18 yo   BACK SURGERY     Age 19   CESAREAN SECTION     ECTOPIC PREGNANCY SURGERY     LAPAROSCOPIC HYSTERECTOMY     LEFT HEART CATH AND CORONARY ANGIOGRAPHY N/A 01/09/2022   Procedure: LEFT HEART CATH AND CORONARY ANGIOGRAPHY;  Surgeon: Tonny Bollman, MD;  Location: [facility] INVASIVE CV LAB;  Service: CV:: Widely patent coronaries with mild nonobstructive LAD plaquing.  Right dominant system.  Normal LVEDP.  Based on clinical presentation, findings are consistent with acute Takotsubo Cardiomyopathy Syndrome   POSTERIOR FUSION LUMBAR SPINE  05/24/2022   [facility], [place]; [name], MD): L2-L3 XLIF, L2-L3 POSTERIOR DECOMPRESSION AND FUSION   TRANSTHORACIC ECHOCARDIOGRAM  01/08/2022   Severely decreased LV function-EF 25 to 30%.  Mid to apical (mostly  anterior) with normal basal motion.  GR 2 DD-elevated LAP.  Mildly dilated LA.  Aortic sclerosis with no stenosis.  No AI.  Normal MV with mild to moderate TR.  Mildly elevated RAP, and PAP (estimated 49 mg). If LAD CAD ruled out - consistent with Takotsubo CM Syndrome.   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  She was admitted 5/23 with Takotsubo cardiomyopathy.  Her troponins were elevated and she was diagnosed with type II MI.  Her event occurred after watching her husband fall in the driveway.  This was very concerning to her.  She developed chest tightness and radiating to her jaw.  She also had left arm and shoulder discomfort.  Her echocardiogram at that time showed an EF of 25-30%.  She was started on metoprolol, losartan, and spironolactone.  Follow-up echocardiogram 8/23 showed an EF of 60 to 65%, mild MR, and her metoprolol was decreased to 12.5 mg daily.  She followed up with Dr. Herbie Baltimore 01/20/2023.  During that time she was doing  well.  She was walking with a cane for balance.  She was using a wheelchair for long distance travel.  She was trying to stay as active as possible.  She was still working with physical therapy.  She was asymptomatic from a cardiac standpoint.  She noted that when she would walk during the day with her cane she was not noticing shortness of breath or chest discomfort.  She felt that she was more active and less short of breath.  Follow-up was planned for 12 months.  She contacted the clinic on  09/23/2023.  She reported 2 to 3 weeks of shortness of breath.  She notices this with increased physical activity.  She also reported fatigue.  She was added to my schedule for today.  She presents to the clinic today for evaluation and states over the past few weeks she has noticed more shortness of breath.  She has also noticed some indigestion.  She also feels that she has been somewhat dizzy and feels that she occasionally needs to lay down.  We reviewed her previous cardiac history.  Her blood pressure today is well-controlled at 130/60.  Her EKG today shows normal sinus rhythm 89 bpm.  She presents with her daughter.  We used shared decision making to decide to proceed with CBC, CMP, thyroid panel, and echocardiogram.  I will have her maintain her physical activity.  She is currently doing physical therapy and walking half mile per day on graded incline..  Today she denies chest pain,  palpitations, melena, hematuria, hemoptysis, diaphoresis, weakness, presyncope, syncope, orthopnea, and PND.     Home Medications    Prior to Admission medications   Medication Sig Start Date End Date Taking? Authorizing Provider  acetaminophen (TYLENOL) 325 MG tablet Take 975 mg by mouth daily as needed for moderate pain.    [provider]  Carboxymethylcellulose Sodium (REFRESH TEARS OP) Place 2 drops into both eyes 3 (three) times daily.    [provider]  levothyroxine (SYNTHROID) 88 MCG tablet Take 88 mcg by mouth daily before breakfast.    [provider]  losartan (COZAAR) 25 MG tablet Take 1/2 tablet (12.5 mg total) by mouth daily. 04/08/23   [facility], Roe Rutherford, PA  metoprolol succinate (TOPROL XL) 25 MG 24 hr tablet Take 0.5 tablets (12.5 mg total) by mouth daily. 05/20/23   Marykay Lex, MD  rosuvastatin (CRESTOR) 10 MG tablet Take 1 tablet (10 mg total) by mouth at bedtime. Patient not taking: Reported on 07/29/2023 04/09/22   Marcelino Duster, PA  solifenacin  (VESICARE) 10 MG tablet Take 10 mg by mouth daily.    [provider]  vitamin B-12 (CYANOCOBALAMIN) 1000 MCG tablet Take 1,000 mcg by mouth daily. Patient not taking: Reported on 07/29/2023    [provider]    Family History    Family History  Problem Relation Age of Onset   Stroke Mother    Heart attack Father    Alcohol abuse Father    Neuropathy Neg Hx    She indicated that her mother is deceased. She indicated that her father is deceased. She indicated that her maternal grandmother is deceased. She indicated that her maternal grandfather is deceased. She indicated that her paternal grandmother is deceased. She indicated that her paternal grandfather is deceased. She indicated that the status of her neg hx is unknown.  Social History    Social History   Socioeconomic History   Marital status: Married  Spouse name: Not on file   Number of children: 7   Years of education: 16   Highest education level: Bachelor's degree (e.g., BA, AB, BS)  Occupational History   Occupation: Retired  Tobacco Use   Smoking status: Never   Smokeless tobacco: Never  Substance and Sexual Activity   Alcohol use: Yes    Comment: once or twice a month   Drug use: No   Sexual activity: Not on file  Other Topics Concern   Not on file  Social History Narrative   Lives at home w/ her husband    Right-handed   Caffeine: 2 cups of coffee in the morning   Social Drivers of Health   Financial Resource Strain: Low Risk  (04/08/2021)   Received from [facility] and [facility], [facility] and [facility]   Overall Financial Resource Strain (CARDIA)    Difficulty of Paying Living Expenses: Not very hard  Food Insecurity: No Food Insecurity (04/08/2021)   Received from [facility] and [facility], [facility] and [facility]   Hunger Vital Sign    Worried About Programme researcher, broadcasting/film/video in the Last Year: Never true    Ran Out of Food in the  Last Year: Never true  Transportation Needs: No Transportation Needs (04/08/2021)   Received from [facility] and [facility], [facility] and [facility]   PRAPARE - Transportation    In the past 12 months, has lack of transportation kept you from medical appointments or from getting medications?: No    In the past 12 months, has lack of transportation kept you from meetings, work, or from getting things needed for daily living?: No  Physical Activity: Sufficiently Active (04/08/2021)   Received from [facility] and [facility], [facility] and [facility]   Exercise Vital Sign    Days of Exercise per Week: 6 days    Minutes of Exercise per Session: 50 min  Stress: No Stress Concern Present (04/08/2021)   Received from [facility] and [facility], [facility] and [facility]   [employer] of Occupational Health - Occupational Stress Questionnaire    Feeling of Stress : Only a little  Social Connections: Socially Integrated (04/08/2021)   Received from [facility] and [facility], [facility] and [facility]   Social Connection and Isolation Panel [facility]    Frequency of Communication with Friends and Family: More than three times a week    Frequency of Social Gatherings with Friends and Family: Once a week    Attends Religious Services: More than 4 times per year    Active Member of [employer] or Organizations: Yes    Attends Engineer, structural: More than 4 times per year    Marital Status: Married  Catering manager Violence: Not At Risk (04/08/2021)   Received from [facility] and [facility], [facility] and [facility]   Humiliation, Afraid, Rape, and Kick questionnaire    Fear of Current or Ex-Partner: No    Emotionally Abused: No    Physically Abused: No    Sexually Abused: No     Review of Systems    General:  No chills, fever, night sweats or weight changes.   Cardiovascular:  No chest pain, dyspnea on exertion, edema, orthopnea, palpitations, paroxysmal nocturnal dyspnea. Dermatological: No rash, lesions/masses Respiratory: No cough, dyspnea Urologic: No hematuria, dysuria Abdominal:   No  nausea, vomiting, diarrhea, bright red blood per rectum, melena, or hematemesis Neurologic:  No visual changes, wkns, changes in mental status. All other systems reviewed and are otherwise negative except as noted above.  Physical Exam    VS:  There were no vitals taken for this visit. , BMI There is no height or weight on file to calculate BMI. GEN: Well nourished, well developed, in no acute distress. HEENT: normal. Neck: Supple, no JVD, carotid bruits, or masses. Cardiac: RRR, no murmurs, rubs, or gallops. No clubbing, cyanosis, edema.  Radials/DP/PT 2+ and equal bilaterally.  Respiratory:  Respirations regular and unlabored, clear to auscultation bilaterally. GI: Soft, nontender, nondistended, BS + x 4. MS: no deformity or atrophy. Skin: warm and dry, no rash. Neuro:  Strength and sensation are intact. Psych: Normal affect.  Accessory Clinical Findings    Recent Labs: 10/03/2023: BUN 23; Creatinine, Ser 0.86; Hemoglobin 13.3; Platelets 297; Potassium 4.6; Sodium 142; TSH 1.010   Recent Lipid Panel    Component Value Date/Time   CHOL 144 01/08/2022 0251   TRIG 25 01/08/2022 0251   HDL 58 01/08/2022 0251   CHOLHDL 2.5 01/08/2022 0251   VLDL 5 01/08/2022 0251   LDLCALC 81 01/08/2022 0251    No BP recorded.  {Refresh Note OR Click here to enter BP  :1}***    ECG personally reviewed by me today-      Echocardiogram 04/07/23  IMPRESSIONS   1. Left ventricular ejection fraction, by estimation, is 60 to 65%. The left ventricle has normal function. The left ventricle has no regional wall motion abnormalities. Left ventricular diastolic parameters were normal. 2. Right ventricular systolic function is normal. The right ventricular size is  normal. Tricuspid regurgitation signal is inadequate for assessing PA pressure. 3. The mitral valve is normal in structure. Mild to moderate mitral valve regurgitation. No evidence of mitral stenosis. 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 4. The aortic valve is tricuspid. There is mild calcification of the aortic valve. Aortic valve regurgitation is not visualized. Aortic valve sclerosis/calcification is present, without any evidence of aortic stenosis. 5. The inferior vena cava is normal in size with greater than 50% respiratory variability, suggesting right atrial pressure of 3 mmHg.  Comparison(s): No significant change from prior study.  FINDINGS Left Ventricle: Left ventricular ejection fraction, by estimation, is 60 to 65%. The left ventricle has normal function. The left ventricle has no regional wall motion abnormalities. Global longitudinal strain performed but not reported based on interpreter judgement due to suboptimal tracking. The left ventricular internal cavity size was normal in size. There is no left ventricular hypertrophy. Left ventricular diastolic parameters were normal.  Right Ventricle: The right ventricular size is normal. No increase in right ventricular wall thickness. Right ventricular systolic function is normal. Tricuspid regurgitation signal is inadequate for assessing PA pressure.  Left Atrium: Left atrial size was normal in size.  Right Atrium: Right atrial size was normal in size.  Pericardium: There is no evidence of pericardial effusion.  Mitral Valve: Although there is a prominent jet of mitral insufficiency on color Doppler, this occurs only briefly at end-systole. Overall mitral insufficiency is mild-to-moderate. The mitral valve is normal in structure. 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 There is mild thickening of the anterior and posterior mitral valve leaflet(s). Mild to  moderate mitral valve regurgitation, with centrally-directed jet. No evidence of mitral valve stenosis.  Tricuspid Valve: The tricuspid  valve is normal in structure. Tricuspid valve regurgitation is not demonstrated. No evidence of tricuspid stenosis.  Aortic Valve: The aortic valve is tricuspid. There is mild calcification of the aortic valve. Aortic valve regurgitation is not visualized. Aortic valve sclerosis/calcification is present, without any evidence of aortic stenosis.  Pulmonic Valve: The pulmonic valve was normal in structure. Pulmonic valve regurgitation is not visualized. No evidence of pulmonic stenosis.  Aorta: The aortic root is normal in size and structure.  Venous: The inferior vena cava is normal in size with greater than 50% respiratory variability, suggesting right atrial pressure of 3 mmHg.  IAS/Shunts: No atrial level shunt detected by color flow Doppler.     Assessment & Plan   1.  DOE-contacted cardiology clinic and reported that over the prior 2 to 3 weeks she was noticing more shortness of breath and fatigue.  She denied weight gain.  She was sleeping well.  She denied dietary changes.  She denied early satiety.  Previous echocardiogram 7/24 reassuring.  Details above. Repeat complete echocardiogram Heart healthy low-sodium diet Order CBC, CMP  Takotsubo cardiomyopathy, history of myocardial infarction due to demand ischemia-previously noted to have resolved on echocardiogram 04/07/2023.  EF was noted to be 60-65%, mild-moderate mitral valve regurgitation was noted.  And no significant changes from previous study.  Hyperlipidemia-LDL 81 on 01/08/22.  Goal less than 100. Continue statin therapy High-fiber diet Maintain physical activity  Myxomatous degenerative mitral valve-most recent echocardiogram showed mild-moderate mitral valve regurgitation with no evidence of mitral valve stenosis.  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 Continue to monitor  PVCs-EKG today shows nsr 89 bpm.  Denies episodes of lightheadedness, presyncope or syncope.  Cardiac unaware.  Disposition: Follow-up with Dr. Herbie Baltimore or me in 1-2 months.   Thomasene Ripple. Damilola Flamm NP-C     12/09/2023, 11:41 AM [facility] [address] Office [phone] Fax [phone]    I spent 14*** minutes examining this patient, reviewing medications, and using patient centered shared decision making involving their cardiac care.   I spent 20 minutes reviewing their past medical history,  medications, and prior cardiac tests.

## 2023-12-12 ENCOUNTER — Ambulatory Visit: Admitting: General Practice

## 2023-12-15 NOTE — Progress Notes (Unsigned)
 Cardiology Clinic Note   Patient Name: [name] Trujillo Date of Encounter: 12/15/2023  Primary Care Provider:  Mosetta Putt, MD Primary Cardiologist:  Bryan Lemma, MD  Patient Profile    [name] Trujillo 83 year old female presents to the clinic today for follow-up evaluation of her Takotsubo cardiomyopathy, hyperlipidemia, and fatigue.  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   She had ACUTE TAKOTSUBO (STRESS) CARDIOMYOPATHY with elevated Troponin Levels - this would be considered "Demand Ischemia - Demand Infarction" & NOT associated with ACS/CAD.     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  - Cath with NO CAD. ==> RESOLVED: f/u Echo 03/2022: EF 60-65%.   Past Surgical History:  Procedure Laterality Date   APPENDECTOMY     80-18 yo   BACK SURGERY     Age 34   CESAREAN SECTION     ECTOPIC PREGNANCY SURGERY     LAPAROSCOPIC HYSTERECTOMY     LEFT HEART CATH AND CORONARY ANGIOGRAPHY N/A 01/09/2022   Procedure: LEFT HEART CATH AND CORONARY ANGIOGRAPHY;  Surgeon: Tonny Bollman, MD;  Location: [facility] INVASIVE CV LAB;  Service: CV:: Widely patent coronaries with mild nonobstructive LAD plaquing.  Right dominant system.  Normal LVEDP.  Based on clinical presentation, findings are consistent with acute Takotsubo Cardiomyopathy Syndrome   POSTERIOR FUSION LUMBAR SPINE  05/24/2022   [facility], [place]; [name], MD): L2-L3 XLIF, L2-L3 POSTERIOR DECOMPRESSION AND FUSION   TRANSTHORACIC ECHOCARDIOGRAM  01/08/2022   Severely decreased LV function-EF 25 to 30%.  Mid to apical (mostly  anterior) with normal basal motion.  GR 2 DD-elevated LAP.  Mildly dilated LA.  Aortic sclerosis with no stenosis.  No AI.  Normal MV with mild to moderate TR.  Mildly elevated RAP, and PAP (estimated 49 mg). If LAD CAD ruled out - consistent with Takotsubo CM Syndrome.   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  She was admitted 5/23 with Takotsubo cardiomyopathy.  Her troponins were elevated and she was diagnosed with type II MI.  Her event occurred after watching her husband fall in the driveway.  This was very concerning to her.  She developed chest tightness and radiating to her jaw.  She also had left arm and shoulder discomfort.  Her echocardiogram at that time showed an EF of 25-30%.  She was started on metoprolol, losartan, and spironolactone.  Follow-up echocardiogram 8/23 showed an EF of 60 to 65%, mild MR, and her metoprolol was decreased to 12.5 mg daily.  She followed up with Dr. Herbie Baltimore 01/20/2023.  During that time she was doing  well.  She was walking with a cane for balance.  She was using a wheelchair for long distance travel.  She was trying to stay as active as possible.  She was still working with physical therapy.  She was asymptomatic from a cardiac standpoint.  She noted that when she would walk during the day with her cane she was not noticing shortness of breath or chest discomfort.  She felt that she was more active and less short of breath.  Follow-up was planned for 12 months.  She contacted the clinic on  09/23/2023.  She reported 2 to 3 weeks of shortness of breath.  She notices this with increased physical activity.  She also reported fatigue.  She was added to my schedule for today.  She presents to the clinic today for evaluation and states over the past few weeks she has noticed more shortness of breath.  She has also noticed some indigestion.  She also feels that she has been somewhat dizzy and feels that she occasionally needs to lay down.  We reviewed her previous cardiac history.  Her blood pressure today is well-controlled at 130/60.  Her EKG today shows normal sinus rhythm 89 bpm.  She presents with her daughter.  We used shared decision making to decide to proceed with CBC, CMP, thyroid panel, and echocardiogram.  I will have her maintain her physical activity.  She is currently doing physical therapy and walking half mile per day on graded incline..  Today she denies chest pain,  palpitations, melena, hematuria, hemoptysis, diaphoresis, weakness, presyncope, syncope, orthopnea, and PND.     Home Medications    Prior to Admission medications   Medication Sig Start Date End Date Taking? Authorizing Provider  acetaminophen (TYLENOL) 325 MG tablet Take 975 mg by mouth daily as needed for moderate pain.    [provider]  Carboxymethylcellulose Sodium (REFRESH TEARS OP) Place 2 drops into both eyes 3 (three) times daily.    [provider]  levothyroxine (SYNTHROID) 88 MCG tablet Take 88 mcg by mouth daily before breakfast.    [provider]  losartan (COZAAR) 25 MG tablet Take 1/2 tablet (12.5 mg total) by mouth daily. 04/08/23   [facility], Roe Rutherford, PA  metoprolol succinate (TOPROL XL) 25 MG 24 hr tablet Take 0.5 tablets (12.5 mg total) by mouth daily. 05/20/23   Marykay Lex, MD  rosuvastatin (CRESTOR) 10 MG tablet Take 1 tablet (10 mg total) by mouth at bedtime. Patient not taking: Reported on 07/29/2023 04/09/22   Marcelino Duster, PA  solifenacin  (VESICARE) 10 MG tablet Take 10 mg by mouth daily.    [provider]  vitamin B-12 (CYANOCOBALAMIN) 1000 MCG tablet Take 1,000 mcg by mouth daily. Patient not taking: Reported on 07/29/2023    [provider]    Family History    Family History  Problem Relation Age of Onset   Stroke Mother    Heart attack Father    Alcohol abuse Father    Neuropathy Neg Hx    She indicated that her mother is deceased. She indicated that her father is deceased. She indicated that her maternal grandmother is deceased. She indicated that her maternal grandfather is deceased. She indicated that her paternal grandmother is deceased. She indicated that her paternal grandfather is deceased. She indicated that the status of her neg hx is unknown.  Social History    Social History   Socioeconomic History   Marital status: Married  Spouse name: Not on file   Number of children: 7   Years of education: 16   Highest education level: Bachelor's degree (e.g., BA, AB, BS)  Occupational History   Occupation: Retired  Tobacco Use   Smoking status: Never   Smokeless tobacco: Never  Substance and Sexual Activity   Alcohol use: Yes    Comment: once or twice a month   Drug use: No   Sexual activity: Not on file  Other Topics Concern   Not on file  Social History Narrative   Lives at home w/ her husband    Right-handed   Caffeine: 2 cups of coffee in the morning   Social Drivers of Health   Financial Resource Strain: Low Risk  (04/08/2021)   Received from [facility] and [facility], [facility] and [facility]   Overall Financial Resource Strain (CARDIA)    Difficulty of Paying Living Expenses: Not very hard  Food Insecurity: No Food Insecurity (04/08/2021)   Received from [facility] and [facility], [facility] and [facility]   Hunger Vital Sign    Worried About Programme researcher, broadcasting/film/video in the Last Year: Never true    Ran Out of Food in the  Last Year: Never true  Transportation Needs: No Transportation Needs (04/08/2021)   Received from [facility] and [employer], [facility] and [facility]   PRAPARE - Transportation    In the past 12 months, has lack of transportation kept you from medical appointments or from getting medications?: No    In the past 12 months, has lack of transportation kept you from meetings, work, or from getting things needed for daily living?: No  Physical Activity: Sufficiently Active (04/08/2021)   Received from [facility] and [facility], [facility] and [facility]   Exercise Vital Sign    Days of Exercise per Week: 6 days    Minutes of Exercise per Session: 50 min  Stress: No Stress Concern Present (04/08/2021)   Received from [facility] and [facility], [facility] and [facility]   [employer] of Occupational Health - Occupational Stress Questionnaire    Feeling of Stress : Only a little  Social Connections: Socially Integrated (04/08/2021)   Received from [facility] and [facility], [facility] and [facility]   Social Connection and Isolation Panel [facility]    Frequency of Communication with Friends and Family: More than three times a week    Frequency of Social Gatherings with Friends and Family: Once a week    Attends Religious Services: More than 4 times per year    Active Member of [employer] or Organizations: Yes    Attends Engineer, structural: More than 4 times per year    Marital Status: Married  Catering manager Violence: Not At Risk (04/08/2021)   Received from [facility] and [facility], [facility] and [facility]   Humiliation, Afraid, Rape, and Kick questionnaire    Fear of Current or Ex-Partner: No    Emotionally Abused: No    Physically Abused: No    Sexually Abused: No     Review of Systems    General:  No chills, fever, night sweats or weight changes.   Cardiovascular:  No chest pain, dyspnea on exertion, edema, orthopnea, palpitations, paroxysmal nocturnal dyspnea. Dermatological: No rash, lesions/masses Respiratory: No cough, dyspnea Urologic: No hematuria, dysuria Abdominal:   No  nausea, vomiting, diarrhea, bright red blood per rectum, melena, or hematemesis Neurologic:  No visual changes, wkns, changes in mental status. All other systems reviewed and are otherwise negative except as noted above.  Physical Exam    VS:  There were no vitals taken for this visit. , BMI There is no height or weight on file to calculate BMI. GEN: Well nourished, well developed, in no acute distress. HEENT: normal. Neck: Supple, no JVD, carotid bruits, or masses. Cardiac: RRR, no murmurs, rubs, or gallops. No clubbing, cyanosis, edema.  Radials/DP/PT 2+ and equal bilaterally.  Respiratory:  Respirations regular and unlabored, clear to auscultation bilaterally. GI: Soft, nontender, nondistended, BS + x 4. MS: no deformity or atrophy. Skin: warm and dry, no rash. Neuro:  Strength and sensation are intact. Psych: Normal affect.  Accessory Clinical Findings    Recent Labs: 10/03/2023: BUN 23; Creatinine, Ser 0.86; Hemoglobin 13.3; Platelets 297; Potassium 4.6; Sodium 142; TSH 1.010   Recent Lipid Panel    Component Value Date/Time   CHOL 144 01/08/2022 0251   TRIG 25 01/08/2022 0251   HDL 58 01/08/2022 0251   CHOLHDL 2.5 01/08/2022 0251   VLDL 5 01/08/2022 0251   LDLCALC 81 01/08/2022 0251    No BP recorded.  {Refresh Note OR Click here to enter BP  :1}***    ECG personally reviewed by me today-      Echocardiogram 04/07/23  IMPRESSIONS   1. Left ventricular ejection fraction, by estimation, is 60 to 65%. The left ventricle has normal function. The left ventricle has no regional wall motion abnormalities. Left ventricular diastolic parameters were normal. 2. Right ventricular systolic function is normal. The right ventricular size is  normal. Tricuspid regurgitation signal is inadequate for assessing PA pressure. 3. The mitral valve is normal in structure. Mild to moderate mitral valve regurgitation. No evidence of mitral stenosis. 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 4. The aortic valve is tricuspid. There is mild calcification of the aortic valve. Aortic valve regurgitation is not visualized. Aortic valve sclerosis/calcification is present, without any evidence of aortic stenosis. 5. The inferior vena cava is normal in size with greater than 50% respiratory variability, suggesting right atrial pressure of 3 mmHg.  Comparison(s): No significant change from prior study.  FINDINGS Left Ventricle: Left ventricular ejection fraction, by estimation, is 60 to 65%. The left ventricle has normal function. The left ventricle has no regional wall motion abnormalities. Global longitudinal strain performed but not reported based on interpreter judgement due to suboptimal tracking. The left ventricular internal cavity size was normal in size. There is no left ventricular hypertrophy. Left ventricular diastolic parameters were normal.  Right Ventricle: The right ventricular size is normal. No increase in right ventricular wall thickness. Right ventricular systolic function is normal. Tricuspid regurgitation signal is inadequate for assessing PA pressure.  Left Atrium: Left atrial size was normal in size.  Right Atrium: Right atrial size was normal in size.  Pericardium: There is no evidence of pericardial effusion.  Mitral Valve: Although there is a prominent jet of mitral insufficiency on color Doppler, this occurs only briefly at end-systole. Overall mitral insufficiency is mild-to-moderate. The mitral valve is normal in structure. 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 There is mild thickening of the anterior and posterior mitral valve leaflet(s). Mild to  moderate mitral valve regurgitation, with centrally-directed jet. No evidence of mitral valve stenosis.  Tricuspid Valve: The tricuspid  valve is normal in structure. Tricuspid valve regurgitation is not demonstrated. No evidence of tricuspid stenosis.  Aortic Valve: The aortic valve is tricuspid. There is mild calcification of the aortic valve. Aortic valve regurgitation is not visualized. Aortic valve sclerosis/calcification is present, without any evidence of aortic stenosis.  Pulmonic Valve: The pulmonic valve was normal in structure. Pulmonic valve regurgitation is not visualized. No evidence of pulmonic stenosis.  Aorta: The aortic root is normal in size and structure.  Venous: The inferior vena cava is normal in size with greater than 50% respiratory variability, suggesting right atrial pressure of 3 mmHg.  IAS/Shunts: No atrial level shunt detected by color flow Doppler.     Assessment & Plan   1.  DOE-contacted cardiology clinic and reported that over the prior 2 to 3 weeks she was noticing more shortness of breath and fatigue.  She denied weight gain.  She was sleeping well.  She denied dietary changes.  She denied early satiety.  Previous echocardiogram 7/24 reassuring.  Details above. Repeat complete echocardiogram Heart healthy low-sodium diet Order CBC, CMP  Takotsubo cardiomyopathy, history of myocardial infarction due to demand ischemia-previously noted to have resolved on echocardiogram 04/07/2023.  EF was noted to be 60-65%, mild-moderate mitral valve regurgitation was noted.  And no significant changes from previous study.  Hyperlipidemia-LDL 81 on 01/08/22.  Goal less than 100. Continue statin therapy High-fiber diet Maintain physical activity  Myxomatous degenerative mitral valve-most recent echocardiogram showed mild-moderate mitral valve regurgitation with no evidence of mitral valve stenosis.  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 Continue to monitor  PVCs-EKG today shows nsr 89 bpm.  Denies episodes of lightheadedness, presyncope or syncope.  Cardiac unaware.  Disposition: Follow-up with Dr. Herbie Baltimore or me in 1-2 months.   Thomasene Ripple. Vahe Pienta NP-C     12/15/2023, 1:15 PM [facility] [address] Office [phone] Fax [phone]    I spent 14*** minutes examining this patient, reviewing medications, and using patient centered shared decision making involving their cardiac care.   I spent 20 minutes reviewing their past medical history,  medications, and prior cardiac tests.

## 2023-12-18 ENCOUNTER — Encounter: Payer: Self-pay | Admitting: General Practice

## 2023-12-18 ENCOUNTER — Ambulatory Visit: Attending: General Practice | Admitting: General Practice

## 2023-12-18 VITALS — BP 108/54 | HR 74 | Ht 61.5 in | Wt 124.6 lb

## 2023-12-18 DIAGNOSIS — I5181 Takotsubo syndrome: Secondary | ICD-10-CM | POA: Diagnosis not present

## 2023-12-18 DIAGNOSIS — E785 Hyperlipidemia, unspecified: Secondary | ICD-10-CM

## 2023-12-18 DIAGNOSIS — I493 Ventricular premature depolarization: Secondary | ICD-10-CM

## 2023-12-18 DIAGNOSIS — I3489 Other nonrheumatic mitral valve disorders: Secondary | ICD-10-CM

## 2023-12-18 DIAGNOSIS — R0609 Other forms of dyspnea: Secondary | ICD-10-CM

## 2023-12-18 NOTE — Patient Instructions (Signed)
 Medication Instructions:  The current medical regimen is effective;  continue present plan and medications as directed. Please refer to the Current Medication list given to you today.  *If you need a refill on your cardiac medications before your next appointment, please call your pharmacy*  Lab Work: NONE  Testing/Procedures: NONE  Follow-Up: At [facility], you and your health needs are our priority.  As part of our continuing mission to provide you with exceptional heart care, our providers are all part of one team.  This team includes your primary Cardiologist (physician) and Advanced Practice Providers or APPs (Physician Assistants and Nurse Practitioners) who all work together to provide you with the care you need, when you need it.  Your next appointment:   6 month(s)  Provider:   Bryan Lemma, MD or Edd Fabian, NP          Other Instructions MAINTAIN YOUR HEART HEALTHY DIET      1st Floor: - Lobby - Registration  - Pharmacy  - Lab - Cafe  2nd Floor: - PV Lab - Diagnostic Testing (echo, CT, nuclear med)  3rd Floor: - Vacant  4th Floor: - TCTS (cardiothoracic surgery) - AFib Clinic - Structural Heart Clinic - Vascular Surgery  - Vascular Ultrasound  5th Floor: - HeartCare Cardiology (general and EP) - Clinical Pharmacy for coumadin, hypertension, lipid, weight-loss medications, and med management appointments    Valet parking services will be available as well.

## 2023-12-21 ENCOUNTER — Encounter (HOSPITAL_COMMUNITY): Payer: Self-pay

## 2023-12-21 ENCOUNTER — Emergency Department (HOSPITAL_COMMUNITY)

## 2023-12-21 ENCOUNTER — Emergency Department (HOSPITAL_COMMUNITY)
Admission: EM | Admit: 2023-12-21 | Discharge: 2023-12-22 | Disposition: A | Discharge status: Home / Self Care | Attending: Emergency Medicine | Admitting: Emergency Medicine

## 2023-12-21 ENCOUNTER — Other Ambulatory Visit: Payer: Self-pay

## 2023-12-21 DIAGNOSIS — R002 Palpitations: Secondary | ICD-10-CM | POA: Insufficient documentation

## 2023-12-21 DIAGNOSIS — R0789 Other chest pain: Secondary | ICD-10-CM | POA: Diagnosis present

## 2023-12-21 DIAGNOSIS — I4891 Unspecified atrial fibrillation: Secondary | ICD-10-CM | POA: Diagnosis not present

## 2023-12-21 DIAGNOSIS — I5181 Takotsubo syndrome: Secondary | ICD-10-CM | POA: Insufficient documentation

## 2023-12-21 DIAGNOSIS — Z7901 Long term (current) use of anticoagulants: Secondary | ICD-10-CM | POA: Insufficient documentation

## 2023-12-21 DIAGNOSIS — E039 Hypothyroidism, unspecified: Secondary | ICD-10-CM | POA: Insufficient documentation

## 2023-12-21 DIAGNOSIS — Z79899 Other long term (current) drug therapy: Secondary | ICD-10-CM | POA: Diagnosis not present

## 2023-12-21 MED ORDER — ASPIRIN 81 MG PO CHEW
81.0000 mg | CHEWABLE_TABLET | Freq: Once | ORAL | Status: AC
  Administered 2023-12-22: 81 mg via ORAL
  Filled 2023-12-21: qty 1

## 2023-12-21 NOTE — ED Provider Notes (Signed)
 [place] EMERGENCY DEPARTMENT AT [facility] Provider Note  CSN: [id number] Arrival date & time: 12/21/23 2310  Chief Complaint(s) Chest Pain  HPI [name] Trujillo is a 83 y.o. female with past medical history as below, significant for HLD, Takotsubo cardiomyopathy, hypothyroid, myxomatous degeneration of mitral valve who presents to the ED with complaint of palpitations, chest pain  Earlier this evening patient was lifting some laundry out of the dryer, she is bending over to began having palpitations, rapid heart rate, felt tightness in her chest.  She attempted to perform some vagal maneuvers which did not alleviate her symptoms, called EMS.  Patient was reported to have had atrial fibrillation with RVR, she was given diltiazem x 2 with improvement to sinus tachycardia.  She currently is feeling much better on arrival.  No longer having chest discomfort or palpitations.  Prior to onset of symptoms she was asymptomatic.  Reports approximately 15 years ago she has similar sensation while she was exercising that resolved spontaneously.  No formal diagnosis of A-fib.  She sees Dr. 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   She had ACUTE TAKOTSUBO (STRESS) CARDIOMYOPATHY with elevated Troponin Levels - this would be considered "Demand Ischemia - Demand Infarction" & NOT associated with ACS/CAD.     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  - Cath with NO CAD. ==> RESOLVED: f/u Echo 03/2022: EF 60-65%.   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 Authorizing Provider  acetaminophen (TYLENOL) 325 MG tablet Take 975 mg by mouth daily as needed for moderate pain.    [provider]  apixaban (ELIQUIS) 2.5 MG TABS tablet Take 1 tablet (2.5 mg total) by mouth 2 (two) times daily. 12/22/23 01/21/24  Teddi Favors, DO  Carboxymethylcellulose Sodium (REFRESH TEARS OP) Place 2 drops into both eyes 3 (three) times daily.    [provider]  levothyroxine (SYNTHROID) 88 MCG tablet Take 88 mcg by mouth daily before breakfast.    [provider]  losartan (COZAAR) 25 MG tablet Take 1/2 tablet (12.5 mg total) by mouth daily. 04/08/23   [facility], Warren Haber, PA  metoprolol succinate (TOPROL XL) 25 MG 24 hr tablet Take 0.5 tablets (12.5 mg total) by mouth daily. 05/20/23   Arleen Lacer, MD  rosuvastatin (CRESTOR) 10 MG tablet Take 1 tablet (10 mg total) by mouth at bedtime. Patient not taking: Reported on 12/18/2023 04/09/22   Lamond Pilot, PA  solifenacin (VESICARE) 10 MG tablet Take 10 mg by mouth daily.    [provider]  Past Surgical History Past Surgical History:  Procedure Laterality Date   APPEN[age]CTOMY     51-18 yo   BACK SURGERY     Age 40   CESAREAN SECTION     ECTOPIC PREGNANCY SURGERY     LAPAROSCOPIC HYSTERECTOMY     LEFT HEART CATH AND CORONARY ANGIOGRAPHY N/A 01/09/2022   Procedure: LEFT  HEART CATH AND CORONARY ANGIOGRAPHY;  Surgeon: Arnoldo Lapping, MD;  Location: [facility] INVASIVE CV LAB;  Service: CV:: Widely patent coronaries with mild nonobstructive LAD plaquing.  Right dominant system.  Normal LVEDP.  Based on clinical presentation, findings are consistent with acute Takotsubo Cardiomyopathy Syndrome   POSTERIOR FUSION LUMBAR SPINE  05/24/2022   [facility], [place]; Agustina Aldrich, MD): L2-L3 XLIF, L2-L3 POSTERIOR DECOMPRESSION AND FUSION   TRANSTHORACIC ECHOCARDIOGRAM  01/08/2022   Severely decreased LV function-EF 25 to 30%.  Mid to apical (mostly anterior) with normal basal motion.  GR 2 DD-elevated LAP.  Mildly dilated LA.  Aortic sclerosis with no stenosis.  No AI.  Normal MV with mild to moderate TR.  Mildly elevated RAP, and PAP (estimated 49 mg). If LAD CAD ruled out - consistent with Takotsubo CM Syndrome.   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   Physical Exam Vital Signs  I have reviewed the triage vital signs BP 130/61   Pulse 67   Temp 97.9 F (36.6 C) (Oral)   Resp 16   Ht 5\' 1"  (1.549 m)   Wt 55.8 kg   SpO2 96%   BMI 23.24 kg/m  Physical Exam Vitals and nursing note reviewed.  Constitutional:      General: She is not in acute distress.    Appearance: Normal appearance. She is  well-developed.  HENT:     Head: Normocephalic and atraumatic.     Right Ear: External ear normal.     Left Ear: External ear normal.     Nose: Nose normal.     Mouth/Throat:     Mouth: Mucous membranes are moist.  Eyes:     General: No scleral icterus.       Right eye: No discharge.        Left eye: No discharge.  Cardiovascular:     Rate and Rhythm: Regular rhythm. Tachycardia present.     Pulses: Normal pulses.     Heart sounds: Normal heart sounds.  Pulmonary:     Effort: Pulmonary effort is normal. No respiratory distress.     Breath sounds: Normal breath sounds. No stridor.  Abdominal:     General: Abdomen is flat. There is no distension.     Palpations: Abdomen is soft.     Tenderness: There is no abdominal tenderness.  Musculoskeletal:  Cervical back: No rigidity.     Right lower leg: No edema.     Left lower leg: No edema.  Skin:    General: Skin is warm and dry.     Capillary Refill: Capillary refill takes less than 2 seconds.  Neurological:     Mental Status: She is alert.  Psychiatric:        Mood and Affect: Mood normal.        Behavior: Behavior normal. Behavior is cooperative.     ED Results and Treatments Labs (all labs ordered are listed, but only abnormal results are displayed) Labs Reviewed  COMPREHENSIVE METABOLIC PANEL WITH GFR - Abnormal; Notable for the following components:      Result Value   Chloride 113 (*)    Glucose, Bld 171 (*)    All other components within normal limits  BRAIN NATRIURETIC PEPTIDE - Abnormal; Notable for the following components:   B Natriuretic Peptide 111.1 (*)    All other components within normal limits  T4, FREE - Abnormal; Notable for the following components:   Free T4 1.27 (*)    All other components within normal limits  TROPONIN I (HIGH SENSITIVITY) - Abnormal; Notable for the following components:   Troponin I (High Sensitivity) 23 (*)    All other components within normal limits  TROPONIN I (HIGH  SENSITIVITY) - Abnormal; Notable for the following components:   Troponin I (High Sensitivity) 36 (*)    All other components within normal limits  CBC WITH DIFFERENTIAL/PLATELET  MAGNESIUM  TSH  TROPONIN I (HIGH SENSITIVITY)                                                                                                                          Radiology DG Chest Port 1 View Result Date: 12/21/2023 CLINICAL DATA:  Chest pain EXAM: PORTABLE CHEST 1 VIEW COMPARISON:  Chest x-ray 01/07/2022 FINDINGS: The heart size and mediastinal contours are within normal limits. Both lungs are clear. Cervical spinal fusion plate is present. No acute fractures are seen. IMPRESSION: No active disease. Electronically Signed   By: Tyron Gallon M.D.   On: 12/21/2023 23:28    Pertinent labs & imaging results that were available during my care of the patient were reviewed by me and considered in my medical decision making (see MDM for details).  Medications Ordered in ED Medications  apixaban (ELIQUIS) tablet 2.5 mg (2.5 mg Oral Given 12/22/23 0120)  aspirin chewable tablet 81 mg (81 mg Oral Given 12/22/23 0010)  Procedures Procedures  (including critical care time)  Medical Decision Making / ED Course    Medical Decision Making:    Dallana Mavity is a 83 y.o. female with past medical history as below, significant for HLD, Takotsubo cardiomyopathy, hypothyroid, myxomatous degeneration of mitral valve who presents to the ED with complaint of palpitations, chest pain. The complaint involves an extensive differential diagnosis and also carries with it a high risk of complications and morbidity.  Serious etiology was considered. Ddx includes but is not limited to: Differential includes all life-threatening causes for chest pain. This includes but is not exclusive to acute coronary  syndrome, aortic dissection, pulmonary embolism, cardiac tamponade, community-acquired pneumonia, pericarditis, musculoskeletal chest wall pain, etc.   Complete initial physical exam performed, notably the patient was in no distress, sinus tachycardia noted on telemetry.    Reviewed and confirmed nursing documentation for past medical history, family history, social history.  Vital signs reviewed.     Clinical Course as of 12/22/23 0603  Mon Dec 22, 2023  0024 CHADSVASC is 4, HAS-BLED 1 [SG]  0101 She has new onset afib, converted after diltiazem by EMS, she has sig cardiac co-morbidities, 83 y/o, will d/w cardiology regarding admission for new onset afib [SG]  0109 Spoke with Dr. Donna Fus with cardiology who reviewed EKG, discussed patient presentation.  She is recommending starting anticoagulation and following up with cardiology in the office.  Recommending against admission. [SG]  0518 Troponin I (High Sensitivity)(!): 36 Likely demand ischemia from now resolved afib, she has no chest pain  [SG]  0528 She remains in sinus rhythm, she was given eliquis here and will be started on this for home, will f/u cardiology [SG]    Clinical Course User Index [SG] Teddi Favors, DO    Brief summary: 83 yo female history above including Takotsubo cardiomyopathy, myxomatous degeneration of mitral valve here with palpitations, A-fib RVR  Patient with new onset atrial fibrillation, she is now back in sinus rhythm.  Discussed with cardiology Dr. Donna Fus who recommends discharge and follow-up with cardiology office.  Despite new onset A-fib she is recommending discharge.  Patient is asymptomatic, no chest pain or dyspnea, no palpitations.  She was given first dose of Eliquis here, her prescription was sent to her pharmacy.  Advised to follow-up with cardiology office  Patient in no distress and overall condition improved here in the ED. Detailed discussions were had with the patient/guardian regarding current  findings, and need for close f/u with PCP or on call doctor. The patient/guardian has been instructed to return immediately if the symptoms worsen in any way for re-evaluation. Patient/guardian verbalized understanding and is in agreement with current care plan. All questions answered prior to discharge.                Additional history obtained: -Additional history obtained from family -External records from outside source obtained and reviewed including: Chart review including previous notes, labs, imaging, consultation notes including  Cardiology documentation, home medications, prior echo Cardiogram 10/24/2023 with G1 DD and LVEF 60-65%   Lab Tests: -I ordered, reviewed, and interpreted labs.   The pertinent results include:   Labs Reviewed  COMPREHENSIVE METABOLIC PANEL WITH GFR - Abnormal; Notable for the following components:      Result Value   Chloride 113 (*)    Glucose, Bld 171 (*)    All other components within normal limits  BRAIN NATRIURETIC PEPTIDE - Abnormal; Notable for the following components:   B Natriuretic Peptide  111.1 (*)    All other components within normal limits  T4, FREE - Abnormal; Notable for the following components:   Free T4 1.27 (*)    All other components within normal limits  TROPONIN I (HIGH SENSITIVITY) - Abnormal; Notable for the following components:   Troponin I (High Sensitivity) 23 (*)    All other components within normal limits  TROPONIN I (HIGH SENSITIVITY) - Abnormal; Notable for the following components:   Troponin I (High Sensitivity) 36 (*)    All other components within normal limits  CBC WITH DIFFERENTIAL/PLATELET  MAGNESIUM  TSH  TROPONIN I (HIGH SENSITIVITY)    Notable for mild trop leak  EKG   EKG Interpretation Date/Time:    Ventricular Rate:    PR Interval:    QRS Duration:    QT Interval:    QTC Calculation:   R Axis:      Text Interpretation:           Imaging Studies ordered: I ordered  imaging studies including cxr I independently visualized the following imaging with scope of interpretation limited to determining acute life threatening conditions related to emergency care; findings noted above I independently visualized and interpreted imaging. I agree with the radiologist interpretation   Medicines ordered and prescription drug management: Meds ordered this encounter  Medications   aspirin chewable tablet 81 mg   apixaban (ELIQUIS) tablet 2.5 mg   DISCONTD: apixaban (ELIQUIS) 2.5 MG TABS tablet    Sig: Take 1 tablet (2.5 mg total) by mouth 2 (two) times daily.    Dispense:  60 tablet    Refill:  0   apixaban (ELIQUIS) 2.5 MG TABS tablet    Sig: Take 1 tablet (2.5 mg total) by mouth 2 (two) times daily.    Dispense:  60 tablet    Refill:  0    -I have reviewed the patients home medicines and have made adjustments as needed   Consultations Obtained: I requested consultation with the cardiology,  and discussed lab and imaging findings as well as pertinent plan - they recommend: eliquis > discharge   Cardiac Monitoring: The patient was maintained on a cardiac monitor.  I personally viewed and interpreted the cardiac monitored which showed an underlying rhythm of: sinus tachy > nsr Continuous pulse oximetry interpreted by myself, 97% on RA.    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   She had ACUTE TAKOTSUBO (STRESS) CARDIOMYOPATHY with elevated Troponin Levels - this would be considered "Demand Ischemia -  Demand Infarction" & NOT associated with ACS/CAD.     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  - Cath with NO CAD. ==> RESOLVED: f/u Echo 03/2022: EF 60-65%.      Dispostion: Disposition decision including need for hospitalization was considered, and patient discharged from emergency department.    Final Clinical Impression(s) /  ED Diagnoses Final diagnoses:  Atrial fibrillation with RVR (HCC)        Teddi Favors, DO 12/22/23 0454

## 2023-12-21 NOTE — ED Triage Notes (Signed)
 Pt was taking clothes out of the dryer tonight when she started having chest pain and palpitations. Alert and oriented x 4, able to ambulate, said that she is not having chest pain now. Ems administered 400 ml of ns and 10 mg of diltiazem twice. Hr went from 160 to 120 with 10 mg of diltiazem but went right back up. After 2nd dose she came back down to the 120's

## 2023-12-22 ENCOUNTER — Other Ambulatory Visit (HOSPITAL_COMMUNITY): Payer: Self-pay

## 2023-12-22 LAB — CBC WITH DIFFERENTIAL/PLATELET
Abs Immature Granulocytes: 0.03 10*3/uL (ref 0.00–0.07)
Basophils Absolute: 0.1 10*3/uL (ref 0.0–0.1)
Basophils Relative: 1 %
Eosinophils Absolute: 0.1 10*3/uL (ref 0.0–0.5)
Eosinophils Relative: 2 %
HCT: 42.5 % (ref 36.0–46.0)
Hemoglobin: 14.2 g/dL (ref 12.0–15.0)
Immature Granulocytes: 0 %
Lymphocytes Relative: 29 %
Lymphs Abs: 2 10*3/uL (ref 0.7–4.0)
MCH: 32.1 pg (ref 26.0–34.0)
MCHC: 33.4 g/dL (ref 30.0–36.0)
MCV: 96.2 fL (ref 80.0–100.0)
Monocytes Absolute: 0.6 10*3/uL (ref 0.1–1.0)
Monocytes Relative: 9 %
Neutro Abs: 4.3 10*3/uL (ref 1.7–7.7)
Neutrophils Relative %: 59 %
Platelets: 276 10*3/uL (ref 150–400)
RBC: 4.42 MIL/uL (ref 3.87–5.11)
RDW: 11.9 % (ref 11.5–15.5)
WBC: 7.1 10*3/uL (ref 4.0–10.5)
nRBC: 0 % (ref 0.0–0.2)

## 2023-12-22 LAB — COMPREHENSIVE METABOLIC PANEL WITH GFR
ALT: 12 U/L (ref 0–44)
AST: 21 U/L (ref 15–41)
Albumin: 3.7 g/dL (ref 3.5–5.0)
Alkaline Phosphatase: 66 U/L (ref 38–126)
Anion gap: 9 (ref 5–15)
BUN: 18 mg/dL (ref 8–23)
CO2: 22 mmol/L (ref 22–32)
Calcium: 10 mg/dL (ref 8.9–10.3)
Chloride: 113 mmol/L — ABNORMAL HIGH (ref 98–111)
Creatinine, Ser: 0.84 mg/dL (ref 0.44–1.00)
GFR, Estimated: >60 mL/min (ref >60)
Glucose, Bld: 171 mg/dL — ABNORMAL HIGH (ref 70–99)
Potassium: 3.8 mmol/L (ref 3.5–5.1)
Sodium: 144 mmol/L (ref 135–145)
Total Bilirubin: 0.7 mg/dL (ref 0.0–1.2)
Total Protein: 6.5 g/dL (ref 6.5–8.1)

## 2023-12-22 LAB — TROPONIN I (HIGH SENSITIVITY)
Troponin I (High Sensitivity): 23 ng/L — ABNORMAL HIGH (ref <18)
Troponin I (High Sensitivity): 36 ng/L — ABNORMAL HIGH (ref <18)
Troponin I (High Sensitivity): 9 ng/L (ref <18)

## 2023-12-22 LAB — BRAIN NATRIURETIC PEPTIDE: B Natriuretic Peptide: 111.1 pg/mL — ABNORMAL HIGH (ref 0.0–100.0)

## 2023-12-22 LAB — TSH: TSH: 1.126 u[IU]/mL (ref 0.350–4.500)

## 2023-12-22 LAB — MAGNESIUM: Magnesium: 2 mg/dL (ref 1.7–2.4)

## 2023-12-22 LAB — T4, FREE: Free T4: 1.27 ng/dL — ABNORMAL HIGH (ref 0.61–1.12)

## 2023-12-22 MED ORDER — APIXABAN 2.5 MG PO TABS
2.5000 mg | ORAL_TABLET | Freq: Two times a day (BID) | ORAL | 0 refills | Status: DC
  Filled 2023-12-22: qty 60, 30d supply, fill #0

## 2023-12-22 MED ORDER — APIXABAN 2.5 MG PO TABS
2.5000 mg | ORAL_TABLET | Freq: Two times a day (BID) | ORAL | Status: DC
  Administered 2023-12-22: 2.5 mg via ORAL
  Filled 2023-12-22: qty 1

## 2023-12-22 MED ORDER — APIXABAN 2.5 MG PO TABS: 2.5000 mg | ORAL_TABLET | Freq: Two times a day (BID) | ORAL | 0 refills | Status: DC

## 2023-12-22 NOTE — Discharge Instructions (Addendum)
 You were diagnosed with atrial fibrillation.  You have been started on a blood thinning medication called Eliquis.  Is very important you take this daily.  A referral has been sent to your cardiologist, they should contact you in the next few days for an appointment.  You may also call the office to get an appointment.  Stop taking aspirin, do not take any NSAID's (e.g. motrin/aspirin/ibuprofen/aleve) while taking eliquis   It was a pleasure caring for you today in the emergency department.  Please return to the emergency department for any worsening or worrisome symptoms.

## 2023-12-22 NOTE — ED Notes (Signed)
 CCMD called and patient placed on monitor

## 2023-12-30 ENCOUNTER — Ambulatory Visit (HOSPITAL_COMMUNITY)
Admission: RE | Admit: 2023-12-30 | Discharge: 2023-12-30 | Disposition: A | Discharge status: Home / Self Care | Source: Ambulatory Visit | Attending: Internal Medicine | Admitting: Internal Medicine

## 2023-12-30 ENCOUNTER — Other Ambulatory Visit (HOSPITAL_COMMUNITY): Payer: Self-pay | Admitting: Internal Medicine

## 2023-12-30 ENCOUNTER — Inpatient Hospital Stay (HOSPITAL_COMMUNITY)
Admission: RE | Admit: 2023-12-30 | Discharge: 2023-12-30 | Disposition: A | Discharge status: Home / Self Care | Source: Ambulatory Visit | Attending: Internal Medicine | Admitting: Internal Medicine

## 2023-12-30 VITALS — BP 168/72 | HR 84 | Ht 61.0 in | Wt 123.2 lb

## 2023-12-30 DIAGNOSIS — Z7901 Long term (current) use of anticoagulants: Secondary | ICD-10-CM | POA: Insufficient documentation

## 2023-12-30 DIAGNOSIS — D6869 Other thrombophilia: Secondary | ICD-10-CM | POA: Insufficient documentation

## 2023-12-30 DIAGNOSIS — I480 Paroxysmal atrial fibrillation: Secondary | ICD-10-CM | POA: Insufficient documentation

## 2023-12-30 DIAGNOSIS — E785 Hyperlipidemia, unspecified: Secondary | ICD-10-CM | POA: Insufficient documentation

## 2023-12-30 DIAGNOSIS — E039 Hypothyroidism, unspecified: Secondary | ICD-10-CM | POA: Diagnosis not present

## 2023-12-30 DIAGNOSIS — I4891 Unspecified atrial fibrillation: Secondary | ICD-10-CM | POA: Diagnosis not present

## 2023-12-30 MED ORDER — APIXABAN 2.5 MG PO TABS: 2.5000 mg | ORAL_TABLET | Freq: Two times a day (BID) | ORAL | 11 refills | Status: DC

## 2023-12-30 NOTE — Progress Notes (Addendum)
 Primary Care Physician: Candise Chambers, MD Primary Cardiologist: Randene Bustard, MD Electrophysiologist: None     Referring Physician: ED     [name] Trujillo is a 83 y.o. female with a history of Takotsubo cardiomyopathy, hypothyroidism, HLD, myxomatous mitral valve, and atrial fibrillation who presents for consultation in the [facility]. ED visit on 12/21/23 for palpitations found to be in new Afib with RVR by EMS. She converted to sinus tachycardia after being given diltiazem x 2 by EMS. Patient is on Eliquis  2.5 mg BID for a CHADS2VASC score of at least 3.  On evaluation today, she is currently in NSR. She does snore but not sure if has sleep apnea. She has not felt any palpitations since ED visit. No missed doses of Eliquis  2.5 mg BID.         Today, she denies symptoms of chest pain, shortness of breath, orthopnea, PND, lower extremity edema, dizziness, presyncope, syncope, snoring, daytime somnolence, bleeding, or neurologic sequela. The patient is tolerating medications without difficulties and is otherwise without complaint today.    Atrial Fibrillation Risk Factors:  she does have symptoms or diagnosis of sleep apnea.   she has a BMI of Body mass index is 23.28 kg/m.Aaron Aas Filed Weights   12/30/23 1413  Weight: 55.9 kg    Current Outpatient Medications  Medication Sig Dispense Refill   acetaminophen  (TYLENOL ) 325 MG tablet Take 975 mg by mouth daily as needed for moderate pain.     Carboxymethylcellulose Sodium (REFRESH TEARS OP) Place 2 drops into both eyes 3 (three) times daily.     levothyroxine  (SYNTHROID ) 88 MCG tablet Take 88 mcg by mouth daily before breakfast.     losartan  (COZAAR ) 25 MG tablet Take 1/2 tablet (12.5 mg total) by mouth daily. 45 tablet 3   metoprolol  succinate (TOPROL  XL) 25 MG 24 hr tablet Take 0.5 tablets (12.5 mg total) by mouth daily. 90 tablet 2   solifenacin (VESICARE) 10 MG tablet Take 10 mg by mouth daily.      apixaban  (ELIQUIS ) 2.5 MG TABS tablet Take 1 tablet (2.5 mg total) by mouth 2 (two) times daily. 60 tablet 11   rosuvastatin  (CRESTOR ) 10 MG tablet Take 1 tablet (10 mg total) by mouth at bedtime. (Patient not taking: Reported on 12/18/2023) 90 tablet 3   No current facility-administered medications for this encounter.    Atrial Fibrillation Management history:  Previous antiarrhythmic drugs: none Previous cardioversions: none Previous ablations: none Anticoagulation history: Eliquis  2.5 mg BID   ROS- All systems are reviewed and negative except as per the HPI above.  Physical Exam: BP (!) 168/72   Pulse 84   Ht 5\' 1"  (1.549 m)   Wt 55.9 kg   BMI 23.28 kg/m   GEN: Well nourished, well developed in no acute distress NECK: No JVD; No carotid bruits CARDIAC: Regular rate and rhythm, no murmurs, rubs, gallops RESPIRATORY:  Clear to auscultation without rales, wheezing or rhonchi  ABDOMEN: Soft, non-tender, non-distended EXTREMITIES:  No edema; No deformity   EKG today demonstrates  Vent. rate 84 BPM PR interval 162 ms QRS duration 88 ms QT/QTcB 356/420 ms P-R-T axes 87 85 70 Normal sinus rhythm Normal ECG When compared with ECG of 21-Dec-2023 23:17, PREVIOUS ECG IS PRESENT  Echo 10/24/23 demonstrated  1. Left ventricular ejection fraction, by estimation, is 60 to 65%. The  left ventricle has normal function. The left ventricle has no regional  wall motion abnormalities. Left ventricular diastolic parameters are  consistent with Grade I diastolic  dysfunction (impaired relaxation). The global longitudinal strain is  indeterminate.   2. Right ventricular systolic function is normal. The right ventricular  size is normal.   3. The mitral valve was not well visualized. No evidence of mitral valve  regurgitation. No evidence of mitral stenosis. There is moderate late  systolic prolapse of both leaflets of the mitral valve.   4. The aortic valve is normal in structure.  Aortic valve regurgitation is  not visualized. No aortic stenosis is present.   5. The inferior vena cava is normal in size with greater than 50%  respiratory variability, suggesting right atrial pressure of 3 mmHg.   Comparison(s): Echocardiogram done 04/07/23 showed an EF of 60-65%.   ASSESSMENT & PLAN CHA2DS2-VASc Score = 3  The patient's score is based upon: CHF History: 0 HTN History: 0 Diabetes History: 0 Stroke History: 0 Vascular Disease History: 0 Age Score: 2 Gender Score: 1       ASSESSMENT AND PLAN: Paroxysmal Atrial Fibrillation (ICD10:  I48.0) The patient's CHA2DS2-VASc score is 3, indicating a 3.2% annual risk of stroke.    She is currently in NSR. Education provided about Afib. Discussion about triggers and medication treatments and ablation going forward if indicated. After discussion, we will proceed with placing cardiac monitor for 2 weeks to determine PAF burden. Will place referral for sleep study due to snoring. Rhythm monitoring device recommended. She is okay for cataract procedure from Afib standpoint since it will have been 4 weeks from date of conversion to NSR.    Secondary Hypercoagulable State (ICD10:  D68.69) The patient is at significant risk for stroke/thromboembolism based upon her CHA2DS2-VASc Score of 3.  Continue Apixaban  (Eliquis ).  Dose of 2.5 mg is correct based on age and weight. Continue Eliquis  due to risk score; patient is in agreement. Will draw CBC at next OV.    Follow up 6 weeks to discuss monitor results / draw CBC.    Minnie Amber, PA-C  [facility] [address] [place], [place] [address] [phone]

## 2023-12-31 ENCOUNTER — Other Ambulatory Visit (HOSPITAL_COMMUNITY): Payer: Self-pay | Admitting: *Deleted

## 2023-12-31 ENCOUNTER — Telehealth: Payer: Self-pay | Admitting: General Practice

## 2023-12-31 ENCOUNTER — Encounter (HOSPITAL_COMMUNITY): Payer: Self-pay

## 2023-12-31 ENCOUNTER — Ambulatory Visit: Payer: Medicare PPO | Admitting: Neurological Surgery

## 2023-12-31 DIAGNOSIS — I480 Paroxysmal atrial fibrillation: Secondary | ICD-10-CM

## 2023-12-31 NOTE — Telephone Encounter (Signed)
 Returned call to [name] Trujillo with Dr. Hilarie Lovely office. She states that Dr Vela Gerhard would like [name] Pray, FNP-C to call him to discuss lipids and recent Chest Pain. Informed her that [name] Pray, FNP-C is off today and then is in out pre-op department. Verbalized understanding. She states that the best time to call Dr Vela Gerhard is in the afternoon after 12PM.

## 2023-12-31 NOTE — Telephone Encounter (Signed)
 Sherry with Dr. Hilarie Lovely office says he is requesting to speak directly with Lawana Pray, NP regarding chest pain + lipid clinic refer. Valinda Gault was unable to provide any additional information. She just says that Dr. Vela Gerhard would like to speak with NP only and it's non-urgent.

## 2024-01-07 NOTE — Telephone Encounter (Signed)
 Sherry with Dr. Vela Gerhard office called in to see if documents were received. I informed her they are in fax queue. She would like a c/b when Aleta Anda, NP has a chance to review

## 2024-01-07 NOTE — Telephone Encounter (Signed)
 DOCUMENTS RECEIVED AND PRINTED FOR [name] Pray, FNP-C TO REVIEW

## 2024-01-08 ENCOUNTER — Telehealth: Payer: Self-pay | Admitting: General Practice

## 2024-01-08 DIAGNOSIS — E785 Hyperlipidemia, unspecified: Secondary | ICD-10-CM

## 2024-01-08 DIAGNOSIS — R0609 Other forms of dyspnea: Secondary | ICD-10-CM

## 2024-01-08 DIAGNOSIS — I2510 Atherosclerotic heart disease of native coronary artery without angina pectoris: Secondary | ICD-10-CM

## 2024-01-08 NOTE — Telephone Encounter (Signed)
 [name] Trujillo called back and we discussed her recent atrial fibrillation and follow-up visit.  She reports that she did not feel she would need lifelong anticoagulation.  We reviewed her CHA2DS2-VASc score.  I reviewed the conversation that I had with Dr. Vela Gerhard and his concern for dyspnea on exertion.  She reports that she did have chest discomfort while having elevated heart rate with atrial fibrillation.  This seems to be related to demand ischemia.  She does note that she has not stressed herself recently and is tolerating physical activity fairly well.  She is wearing cardiac event monitor.  We reviewed her cholesterol.  She is currently not taking rosuvastatin .  I will set up appointment for lipid clinic.  I will reach out to Dr. Addie Holstein to discuss whether ischemic evaluation would be appropriate in this case.

## 2024-01-09 NOTE — Telephone Encounter (Signed)
 I do not know that she is having macrovascular ischemia.  Could consider coronary CTA or stress PET for peace of mind, but without heart catheterization a couple years ago, it is less likely that she actually developed significant lesions.  Would have to know what symptoms she is having.  Blomgren is pretty good, and it may be nice to just have a stress PET that can also tell us  EF, maybe give a sense of microvascular flow.  Randene Bustard, MD

## 2024-01-13 NOTE — Telephone Encounter (Signed)
 I contacted [name] today in follow-up for her dyspnea on exertion and coronary artery disease.  She agrees to proceed with cardiac PET stress testing.  We also discussed her hyperlipidemia.  I will refer her to the pharmacy lipid clinic for consideration of PCSK9 inhibitor.  She is statin intolerant.  She continues to do well at this time.  Please order cardiac PET stress testing and place referral for pharmacy lipid clinic. Please order CMP prior to testing.  Thank you.  Informed Consent   Shared Decision Making/Informed Consent The risks [chest pain, shortness of breath, cardiac arrhythmias, dizziness, blood pressure fluctuations, myocardial infarction, stroke/transient ischemic attack, nausea, vomiting, allergic reaction, radiation exposure, metallic taste sensation and life-threatening complications (estimated to be 1 in 10,000)], benefits (risk stratification, diagnosing coronary artery disease, treatment guidance) and alternatives of a cardiac PET stress test were discussed in detail with [name] and she agrees to proceed.     Thank you for your help.  Chet Cota. Burdette Gergely NP-C     01/13/2024, 1:21 PM [facility] [address] Office [phone] Fax [phone]

## 2024-01-13 NOTE — Telephone Encounter (Signed)
 Called spoke to patient  she is aware orders has been placed  for  cardiac PET Scan , lipids , referral for CVVR -lipids   RN verbally gave instruction for Cardiac PET Scan. Also it has been mailed to the patient and sent via mychart.  Patient is aware that she will receive phone call to set up both orders above.  Patient voiced understanding.

## 2024-01-13 NOTE — Addendum Note (Signed)
 Addended by: Carie Charity on: 01/13/2024 04:30 PM   Modules accepted: Orders

## 2024-01-13 NOTE — Addendum Note (Signed)
 Addended by: Bebe Bourdon on: 01/13/2024 02:21 PM   Modules accepted: Orders

## 2024-01-21 ENCOUNTER — Ambulatory Visit: Payer: Medicare PPO | Admitting: Cardiology

## 2024-01-22 ENCOUNTER — Ambulatory Visit: Payer: Self-pay | Admitting: General Practice

## 2024-01-23 NOTE — Addendum Note (Signed)
 Encounter addended by: Tess Fife, RN on: 01/23/2024 9:46 AM  Actions taken: Imaging Exam ended

## 2024-01-26 ENCOUNTER — Ambulatory Visit (HOSPITAL_COMMUNITY): Payer: Self-pay | Admitting: Internal Medicine

## 2024-01-30 ENCOUNTER — Other Ambulatory Visit (HOSPITAL_COMMUNITY): Payer: Self-pay | Admitting: *Deleted

## 2024-01-30 DIAGNOSIS — I480 Paroxysmal atrial fibrillation: Secondary | ICD-10-CM

## 2024-02-05 ENCOUNTER — Encounter (HOSPITAL_COMMUNITY): Payer: Self-pay | Admitting: Emergency Medicine

## 2024-02-10 ENCOUNTER — Ambulatory Visit (HOSPITAL_COMMUNITY): Admitting: Internal Medicine

## 2024-02-17 ENCOUNTER — Ambulatory Visit: Admitting: Neurological Surgery

## 2024-03-05 ENCOUNTER — Ambulatory Visit: Attending: Cardiology | Admitting: Pharmacist

## 2024-03-05 ENCOUNTER — Encounter: Payer: Self-pay | Admitting: Pharmacist

## 2024-03-05 ENCOUNTER — Telehealth: Payer: Self-pay | Admitting: Pharmacist

## 2024-03-05 DIAGNOSIS — I2510 Atherosclerotic heart disease of native coronary artery without angina pectoris: Secondary | ICD-10-CM | POA: Diagnosis not present

## 2024-03-05 DIAGNOSIS — T466X5A Adverse effect of antihyperlipidemic and antiarteriosclerotic drugs, initial encounter: Secondary | ICD-10-CM

## 2024-03-05 DIAGNOSIS — E785 Hyperlipidemia, unspecified: Secondary | ICD-10-CM

## 2024-03-05 DIAGNOSIS — M791 Myalgia, unspecified site: Secondary | ICD-10-CM | POA: Insufficient documentation

## 2024-03-05 NOTE — Patient Instructions (Addendum)
 It was nice meeting you today  We would like your LDL (bad cholesterol) to be less than 100  The medication we discussed today is called Repatha, which is an injection you would take once every 2 weeks  I will complete the prior authorization for you and contact you when approved  Once you start the medication we will recheck your fasting lipid panel in about 3 months  Medford Bolk, PharmD, BCACP, CDCES, CPP [facility] [address] [place], [place] [address] Phone: [phone]; Fax: [phone] 03/05/2024 3:04 PM

## 2024-03-05 NOTE — Progress Notes (Signed)
 Patient ID: [name] Trujillo                 DOB: 12-Aug-1941                    MRN: [id number]     HPI: [name] Trujillo is a 83 y.o. female patient referred to lipid clinic by Josefa Beauvais.  Patient of Dr Anner and is also seen at A Fib clinic. PMH is significant for Takotsubo cardiomyopathy, A Fib, HTN, and HLD. Patient is statin intolerant.  Patient presents today to discuss lipid management. Has tried rosuvastatin , atorvastatin , pravastatin, and ezetimibe. All caused myalgias.  Eats majority of her meals at home with husband. She does the cooking and cooks heart healthy meals. Walks with a cane but reports is always physically active.  Father died of MI in his 48s.  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   She had ACUTE TAKOTSUBO (STRESS) CARDIOMYOPATHY with elevated Troponin Levels - this would be considered Demand Ischemia - Demand Infarction & NOT associated with ACS/CAD.     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  - Cath with NO CAD. ==> RESOLVED: f/u Echo 03/2022: EF 60-65%.    Current Outpatient Medications on File Prior to Visit  Medication Sig Dispense Refill   acetaminophen  (TYLENOL ) 325 MG tablet Take 975 mg by mouth daily as needed for moderate pain.     apixaban  (ELIQUIS ) 2.5 MG TABS tablet Take 1 tablet (2.5 mg total) by mouth 2 (two) times daily. 60 tablet 11   Carboxymethylcellulose Sodium (REFRESH TEARS OP) Place 2 drops into both eyes 3 (three) times daily.      levothyroxine  (SYNTHROID ) 88 MCG tablet Take 88 mcg by mouth daily before breakfast.     losartan  (COZAAR ) 25 MG tablet Take 1/2 tablet (12.5 mg total) by mouth daily. 45 tablet 3   metoprolol  succinate (TOPROL  XL) 25 MG 24 hr tablet Take 0.5 tablets (12.5 mg total) by mouth daily. 90 tablet 2   rosuvastatin  (CRESTOR ) 10 MG tablet Take 1 tablet (10 mg total) by mouth at bedtime. (Patient not taking: Reported on 12/18/2023) 90 tablet 3   solifenacin (VESICARE) 10 MG tablet Take 10 mg by mouth daily.     No current facility-administered medications on file prior to visit.    Allergies  Allergen Reactions   Oxycodone Hcl Nausea And Vomiting   Primidone    Propranolol    Tolterodine    Hydrocodone Nausea And Vomiting    Assessment/Plan:  1. Hyperlipidemia - Patient with elevated LDL at 150. Since intolerant to statins and ezetimibe, recommend addition of PCSK9i.  Using demo pen, educated on mechanism of action, storage, site selection, administration, and possible adverse effects. Will complete PA and contact patient when approved. Recheck fasting lipid panel in 2-3 months.  Start PCSK9i q 2 weeks Recheck lipid panel in 2-3 months  Chris Raysean Graumann, PharmD, [place], CDCES, CPP [facility] [address] [place], [place] [address]  Phone: [phone]; Fax: [phone] 03/05/2024 4:17 PM

## 2024-03-05 NOTE — Telephone Encounter (Signed)
 Please complete PA for Repatha/Praluent

## 2024-03-08 ENCOUNTER — Other Ambulatory Visit (HOSPITAL_COMMUNITY): Payer: Self-pay

## 2024-03-08 ENCOUNTER — Telehealth: Payer: Self-pay

## 2024-03-08 DIAGNOSIS — I2510 Atherosclerotic heart disease of native coronary artery without angina pectoris: Secondary | ICD-10-CM

## 2024-03-08 DIAGNOSIS — E785 Hyperlipidemia, unspecified: Secondary | ICD-10-CM

## 2024-03-08 NOTE — Telephone Encounter (Signed)
 PA request has been Submitted. New Encounter has been or will be created for follow up. For additional info see Pharmacy Prior Auth telephone encounter from 03/08/24.

## 2024-03-08 NOTE — Telephone Encounter (Signed)
 Pharmacy Patient Advocate Encounter   Received notification from Physician's Office that prior authorization for REPATHA is required/requested.   Insurance verification completed.   The patient is insured through [employer] .   Per test claim: PA required; PA submitted to above mentioned insurance via CoverMyMeds Key/confirmation #/E[facility] Status is pending

## 2024-03-09 ENCOUNTER — Other Ambulatory Visit (HOSPITAL_COMMUNITY): Payer: Self-pay

## 2024-03-09 MED ORDER — REPATHA SURECLICK 140 MG/ML ~~LOC~~ SOAJ: 1.0000 mL | SUBCUTANEOUS | 1 refills | Status: AC

## 2024-03-09 NOTE — Addendum Note (Signed)
 Addended by: DARRELL BRUCKNER on: 03/09/2024 09:42 AM   Modules accepted: Orders

## 2024-03-09 NOTE — Telephone Encounter (Signed)
 Contacted pt to advise her repatha approved.

## 2024-03-09 NOTE — Telephone Encounter (Signed)
 Pharmacy Patient Advocate Encounter  Received notification from [facility] that Prior Authorization for REPATHA has been APPROVED from 09/10/23 to 09/08/24. Ran test claim, Copay is $40. This test claim was processed through [facility]- copay amounts may vary at other pharmacies due to pharmacy/plan contracts, or as the patient moves through the different stages of their insurance plan.

## 2024-03-25 ENCOUNTER — Telehealth: Payer: Self-pay | Admitting: General Practice

## 2024-03-25 NOTE — Telephone Encounter (Signed)
*  STAT* If patient is at the pharmacy, call can be transferred to refill team.   1. Which medications need to be refilled? (please list name of each medication and dose if known) apixaban  (ELIQUIS ) 2.5 MG TABS tablet    2. Would you like to learn more about the convenience, safety, & potential cost savings by using the [facility]? No   3. Are you open to using the [facility] (Type [facility]. ) No   4. Which pharmacy/location (including street and city if local pharmacy) is medication to be sent to?  [facility] [address] - [place], [place] - [address]   5. Do they need a 30 day or 90 day supply? 90 day

## 2024-03-26 ENCOUNTER — Other Ambulatory Visit: Payer: Self-pay

## 2024-03-26 MED ORDER — APIXABAN 2.5 MG PO TABS: 2.5000 mg | ORAL_TABLET | Freq: Two times a day (BID) | ORAL | 1 refills | Status: DC

## 2024-03-26 NOTE — Telephone Encounter (Signed)
 Prescription refill request for Eliquis  received. Indication:pvc Last office visit:6/25 Scr:0.84  4/25 Age: 83 Weight:55.9  kg  Prescription refilled

## 2024-04-02 ENCOUNTER — Other Ambulatory Visit: Payer: Self-pay

## 2024-04-02 MED ORDER — LOSARTAN POTASSIUM 25 MG PO TABS: 12.5000 mg | ORAL_TABLET | Freq: Every day | ORAL | 2 refills | Status: AC

## 2024-04-14 ENCOUNTER — Inpatient Hospital Stay (HOSPITAL_COMMUNITY): Admission: RE | Admit: 2024-04-14 | Source: Ambulatory Visit

## 2024-04-19 ENCOUNTER — Ambulatory Visit: Admitting: Orthopaedic Surgery

## 2024-04-21 ENCOUNTER — Ambulatory Visit: Admitting: Orthopaedic Surgery

## 2024-04-21 ENCOUNTER — Other Ambulatory Visit (INDEPENDENT_AMBULATORY_CARE_PROVIDER_SITE_OTHER): Payer: Self-pay

## 2024-04-21 DIAGNOSIS — M25551 Pain in right hip: Secondary | ICD-10-CM | POA: Diagnosis not present

## 2024-04-21 NOTE — Progress Notes (Signed)
 The patient is a very pleasant and active 83 year old female sent to me from Dr. Maude Sprague to evaluate and treat right hip pain.  She points more to the sciatic and ischial area of her right side as a source of her pain.  She does ambulate with a walking stick.  She has a history of a significant cervical and lumbar spine surgery from years ago.  This has been going on for a while in terms of the pain she is experiencing.  She denies any groin pain.  She is on Eliquis  so she cannot take anti-inflammatories.  She has had therapy remotely in the past in terms of PT as an outpatient.  On exam her right and left hips move smoothly and fluidly no blocks or rotation at all.  There is no pain over the trochanteric area or the IT band on either side.  When I have her lay with her right side up on her side there is pain more along the ischium and the sciatic region.  Imaging studies of the pelvis and right hip are normal.  The hip joint space is well-maintained.  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  There is instrumented fusion several levels above the.  I did try a steroid injection around her right hip ischial area to see if this would help.  She is agreeable to being set up for outpatient physical therapy at [facility] with any modalities per therapist discretion they can help with the right sided sciatica, ischial pain and hip pain.  She denies any low back pain but I do feel that there is a component of her back that is really causing her symptoms.  She does have a significant lurch in her gait as well and does report a history of previous foot drop on the left side.  All questions concerns were answered and addressed.  Will see her back in 4 weeks after course of physical therapy.

## 2024-04-22 ENCOUNTER — Other Ambulatory Visit: Payer: Self-pay

## 2024-04-22 DIAGNOSIS — M25551 Pain in right hip: Secondary | ICD-10-CM

## 2024-04-23 ENCOUNTER — Telehealth: Payer: Self-pay

## 2024-04-23 NOTE — Telephone Encounter (Signed)
 Spoke with pt and would like to r/s due to bad bursitis. Currently resides in [place] . Son is getting married in October in [place], wants to r/s her appointment to 06/2024.  Will reach out to scheduler.

## 2024-04-26 ENCOUNTER — Ambulatory Visit: Admitting: Neurological Surgery

## 2024-05-04 NOTE — Therapy (Signed)
 OUTPATIENT PHYSICAL THERAPY THORACOLUMBAR & LOWER EXTREMITY EVALUATION   Patient Name: [name] Trujillo MRN: [id number] DOB:1941-01-22, 83 y.o., female Today's Date: 05/11/2024  END OF SESSION:  PT End of Session - 05/11/24 1441     Visit Number 1    Date for PT Re-Evaluation 07/20/24    Authorization Type [employer]    Authorization Time Period Prior auth pending    Authorization - Visit Number 1    PT Start Time 1441    PT Stop Time 1541    PT Time Calculation (min) 60 min    Activity Tolerance Patient tolerated treatment well    Behavior During Therapy WFL for tasks assessed/performed          Past Medical History:  Diagnosis Date   High cholesterol    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   She had ACUTE TAKOTSUBO (STRESS) CARDIOMYOPATHY with elevated Troponin Levels - this would be considered Demand Ischemia - Demand Infarction & NOT associated with ACS/CAD.     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  - Cath with NO CAD. ==> RESOLVED: f/u Echo 03/2022: EF 60-65%.   Past Surgical History:  Procedure Laterality Date   APPENDECTOMY     28-18 yo   BACK SURGERY     Age 12   CESAREAN SECTION     ECTOPIC PREGNANCY SURGERY     LAPAROSCOPIC HYSTERECTOMY     LEFT HEART CATH AND CORONARY ANGIOGRAPHY N/A 01/09/2022   Procedure: LEFT HEART CATH AND CORONARY ANGIOGRAPHY;  Surgeon: Wonda Sharper, MD;  Location: [facility] INVASIVE CV LAB;  Service: CV:: Widely patent coronaries with mild nonobstructive LAD plaquing.  Right dominant system.  Normal LVEDP.  Based on clinical presentation, findings are consistent with acute Takotsubo Cardiomyopathy Syndrome   POSTERIOR FUSION LUMBAR  SPINE  05/24/2022   [facility], [place]; [name], MD): L2-L3 XLIF, L2-L3 POSTERIOR DECOMPRESSION AND FUSION   TRANSTHORACIC ECHOCARDIOGRAM  01/08/2022   Severely decreased LV function-EF 25 to 30%.  Mid to apical (mostly anterior) with normal basal motion.  GR 2 DD-elevated LAP.  Mildly dilated LA.  Aortic sclerosis with no stenosis.  No AI.  Normal MV with mild to moderate TR.  Mildly elevated RAP, and PAP (estimated 49 mg). If LAD CAD ruled out - consistent with Takotsubo CM Syndrome.   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) 12/30/2023   New onset atrial fibrillation (HCC) 12/30/2023   Myopia of both eyes with astigmatism and presbyopia 05/06/2023   Dry eye syndrome of both eyes 05/06/2023   Mild tricuspid regurgitation 05/06/2022   Mild mitral regurgitation 05/06/2022   Hypertension 05/06/2022   History of seizure 05/06/2022   Takotsubo cardiomyopathy-resolved 01/10/2022   Hyperlipidemia with target LDL less than 100 01/10/2022   Hypothyroid 01/10/2022   History of myocardial infarction due to  demand ischemia (HCC) 01/08/2022   Numbness in both legs 04/10/2021   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   Posterior vitreous detachment of both eyes 02/05/2018   Ventricular premature beats 10/31/2015   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   Pure hypercholesterolemia 06/28/2014   Plantar fasciitis 06/28/2014    PCP: Windy Coy, MD   REFERRING PROVIDER: Vernetta Lonni GRADE, MD  REFERRING DIAG:  [phone] (ICD-10-CM) - Pain in right hip  M54.50 (ICD-10-CM) - Low back pain,  unspecified  Eval and treat low back/right pelvis and right hip/ishium Any modalities per therapist   THERAPY DIAG:  Radiculopathy, lumbar region  Pain in right hip  Muscle weakness (generalized)  Cramp and spasm  Other abnormalities of gait and mobility  Unsteadiness on feet  RATIONALE FOR EVALUATION AND TREATMENT: Rehabilitation  ONSET DATE: Memorial Day weekend  NEXT MD VISIT: 05/20/2024   SUBJECTIVE:                                                                                                                                                                                                         SUBJECTIVE STATEMENT: [name] is well know to me from several prior PT episodes for foot drop, cervical and lumbar radiculopathy.  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  Woke up the next morning with severe R hip/buttock pain.  Pain mostly localized to sciatic and ischial area of her R buttock.  PCP provider told her it was likely hip bursitis but ortho MD feels like it is more likely radicular from her back.  Some relief from steroid injection around her right hip ischial area provided by ortho MD but pain has never fully gone away.   She does have a significant lurch in her gait which is somewhat more pronounced than on prior PT episodes with this PT.  PAIN: Are you having pain? Yes: NPRS scale: 2/10 currently, up to 10/10 Pain location: R lower buttock most of the time, brief B lower back a few days, denies shooting pain Pain description: pounding  Aggravating factors: walking, positional changes  Relieving factors: Aleve or Advil (doesn't take her Eliquis  when taking NSAIDs)  PERTINENT HISTORY:  L2-3 XLIF/PSF 05/24/22; C5-7 ACDF 09/21/20; idiopathic progressive neuropathy; hypthyroidism; DDD; remote h/o back surgery at age 77; h/o scoliosis; Takotsubo cardiomyopathy 01/10/22; CAD; paroxysmal  afib; mitral and tricuspid regurgitation; myalgia due to statin; recent cataract surgery  PRECAUTIONS: Fall  RED FLAGS: Bowel or bladder incontinence: Yes: chronic bladder  issues, bowel urgency - somewhat worse than before   WEIGHT BEARING RESTRICTIONS: No  FALLS:  Has patient fallen in last 6 months? No  LIVING ENVIRONMENT: Lives with: lives with their spouse Lives in: House/apartment Stairs: Yes: External: 5-6 steps; on left going up Has following equipment at home: Walker - 2 wheeled, shower chair, and hiking/walking poles  OCCUPATION: Retired Runner, broadcasting/film/video  PLOF: Independent and Leisure: walking ~30 minutes 1x/day (3x around the court near where she lives); sewing/quilting; likes working in the yard    PATIENT GOALS: To start walking again w/o doing more damage.   OBJECTIVE: (objective measures completed at initial evaluation unless otherwise dated)  DIAGNOSTIC FINDINGS:  04/21/24 - XR RIGHT HIP: An AP pelvis and lateral of the right hip shows well-maintained joint space with no cortical irregularities around the hip or the trochanteric area of the hip.  The lower lumbar spine shows severe degenerative changes at L4-L5 and L5-S1 on the AP view.   08/27/22 - LUMBAR SPINE - COMPLETE 4+ VIEW FINDINGS: There is mild levoconvex curvature of the lumbar spine, unchanged. Alignment is anatomic. There are bilateral transpedicular screws in posterior fusion rods present at L2-L3, new from prior. Disc spacers present. Alignment is anatomic. No fractures are identified. There is moderate severe disc space narrowing at L4-L5 and L5-S1 as well as L1-L2 similar to the prior study. There are atherosclerotic calcifications of the aorta.   IMPRESSION: 1. Status post posterior fusion at L2-L3. Alignment is anatomic. 2. Multilevel degenerative disc disease, similar to prior study.  PATIENT SURVEYS:  Modified Oswestry:  MODIFIED OSWESTRY DISABILITY SCALE  Date:  05/11/2024  Pain intensity 2 =  Pain  medication provides me with complete relief from pain.  2. Personal care (washing, dressing, etc.) 1 =  I can take care of myself normally, but it increases my pain.  3. Lifting 4 = I can lift only very light weights  4. Walking 1 = Pain prevents me from walking more than 1 mile.  5. Sitting 1 =  I can only sit in my favorite chair as long as I like.  6. Standing 2 =  Pain prevents me from standing more than 1 hour  7. Sleeping 2 =  Even when I take pain medication, I sleep less than 6 hours  8. Social Life 2 = Pain prevents me from participating in more energetic activities (eg. sports, dancing).  9. Traveling 2 =  My pain restricts my travel over 2 hours.  10. Employment/ Homemaking 2 = I can perform most of my homemaking/job duties, but pain prevents me from performing more physically stressful activities (eg, lifting, vacuuming).  Total 17 / 50 = 34.0 %  Interpretation Moderate disability   Interpretation of scores: Score Category Description  0-20% Minimal Disability The patient can cope with most living activities. Usually no treatment is indicated apart from advice on lifting, sitting and exercise  21-40% Moderate Disability The patient experiences more pain and difficulty with sitting, lifting and standing. Travel and social life are more difficult and they may be disabled from work. Personal care, sexual activity and sleeping are not grossly affected, and the patient can usually be managed by conservative means  41-60% Severe Disability Pain remains the main problem in this group, but activities of daily living are affected. These patients require a detailed investigation  61-80% Crippled Back pain impinges on all aspects of the patient's life. Positive intervention is required  81-100% Bed-bound These patients are either bed-bound or exaggerating  their symptoms  Bluford FORBES Zoe DELENA Karon DELENA, et al. Surgery versus conservative management of stable thoracolumbar fracture: the PRESTO  feasibility RCT. [place] ([place]): [employer]; 2021 Nov. [employer] Assessment, No. 25.62.) Appendix 3, Oswestry Disability Index category descriptors. Available from: [employer]el[employer]cz Minimally Clinically Important Difference (MCID) = 12.8%  LEFS  Extreme difficulty/unable (0), Quite a bit of difficulty (1), Moderate difficulty (2), Little difficulty (3), No difficulty (4) Survey date:  05/11/24  Any of your usual work, housework or school activities 1  2. Usual hobbies, recreational or sporting activities 2  3. Getting into/out of the bath 2  4. Walking between rooms 2  5. Putting on socks/shoes 2  6. Squatting  0  7. Lifting an object, like a bag of groceries from the floor 2  8. Performing light activities around your home 2  9. Performing heavy activities around your home 0  10. Getting into/out of a car 2  11. Walking 2 blocks 0  12. Walking 1 mile 2  13. Going up/down 10 stairs (1 flight) 2  14. Standing for 1 hour 2  15.  sitting for 1 hour 3  16. Running on even ground 0  17. Running on uneven ground 0  18. Making sharp turns while running fast 0  19. Hopping  0  20. Rolling over in bed 2  Score total:   26 / 80 = 32.5 %  Functional limitation: Moderate     SCREENING FOR RED FLAGS: Bowel or bladder incontinence: Yes: chronic bladder issues, bowel urgency - somewhat worse than before Spinal tumors: No Cauda equina syndrome: No Compression fracture: No Abdominal aneurysm: No  COGNITION:  Overall cognitive status: Within functional limits for tasks assessed    SENSATION: WFL Except B distal LE neuropathy  POSTURE:  rounded shoulders, forward head, right pelvic obliquity, and scoliosis  PALPATION: Increased muscle tension in L>R lumbar paraspinals and R>L glutes and piriformis with TTP over inferior glutes and piriformis near hamstring origin  LUMBAR ROM: *Lumbar ROM assessed with UE support for balance and close  SBA/CGA of PT  Active  Eval  Flexion Hands to distal shins  Extension 50% limited  Right lateral flexion Hand to upper thigh - p!  Left lateral flexion Hand to lateral knee  Right rotation 50% limited  Left rotation 30% limited - p!  (Blank rows = not tested)  MUSCLE LENGTH: Hamstrings: mild/mod tight B ITB: mild tight B Piriformis: mod/severe tight B Hip IR: mod tight B Hip flexors: mild tight R Quads: mild tight R>L Heelcord: mild/mod tight B  LOWER EXTREMITY ROM:    Grossly WFL other than limitations indicated above due to muscle tightness   LOWER EXTREMITY MMT:    MMT Right eval Left eval  Hip flexion 3+ 4-  Hip extension 4- 4  Hip abduction 3+ 4-  Hip adduction 3+ 4-  Hip internal rotation 4- 4-  Hip external rotation 3+ 3+  Knee flexion 4 4  Knee extension 4 4  Ankle dorsiflexion 3- 3-  Ankle plantarflexion    Ankle inversion    Ankle eversion     (Blank rows = not tested)  FUNCTIONAL TESTS: (TBA next visit) 5 times sit to stand:  Timed up and go (TUG):  10 meter walk test:  Gait speed: Berg Balance Scale:  Functional gait assessment:   Standardized testing results as of discharge from latest PT episode (11/11/23): 5xSTS = 11.84 sec  = 13.63 sec w/o AD Gait  speed = 2.41 ft/sec w/o AD Berg = 45/56; scores of 37-45 indicate significant (>80%) fall risk  FGA = 19/30; scores of 19-24 indicate a medium risk for falls   GAIT: Distance walked: Clinic distances Assistive device utilized: Single walking/trekking pole on R Level of assistance: SBA Gait pattern: Increased sway, step through pattern, decreased stride length, trendelenburg, lateral hip instability, poor foot clearance- Right, and poor foot clearance- Left Comments: B foot slap   TODAY'S TREATMENT:   05/04/2024  SELF CARE:  Reviewed eval findings and role of PT in addressing identified deficits as well as instruction in initial HEP (see below).    PATIENT EDUCATION:  Education details:  PT eval findings, anticipated POC, need for further assessment of balance, and initial HEP  Person educated: Patient Education method: Explanation, Demonstration, Verbal cues, and Handouts Education comprehension: verbalized understanding, returned demonstration, and verbal cues required  HOME EXERCISE PROGRAM: Access Code: [facility] URL: [email or web address] Date: 05/11/2024 Prepared by: Elijah Hidden  Exercises - Hooklying Hamstring Stretch with Strap  - 2 x daily - 7 x weekly - 3 reps - 30 sec hold - Supine Gluteus Stretch  - 2 x daily - 7 x weekly - 3 reps - 30 sec hold - Supine Figure 4 Piriformis Stretch  - 2 x daily - 7 x weekly - 3 reps - 30 sec hold - Supine Piriformis Stretch with Foot on Ground  - 2 x daily - 7 x weekly - 3 reps - 30 sec hold   ASSESSMENT:  CLINICAL IMPRESSION: [name] Trujillo is a 83 y.o. female who was referred to physical therapy for evaluation and treatment for R hip/buttock pain presumed to be the result of lumbar radiculopathy.  Patient reports onset of R buttock pain beginning Memorial Day weekend 2025.  Pain is worse with walking and positional changes.  Patient has deficits in hip ROM, proximal LE flexibility, B LE strength, abnormal posture, and TTP with abnormal muscle tension in R lower glutes and piriformis which are interfering with ADLs and are impacting quality of life.  On Modified Oswestry patient scored 17/50 demonstrating 34% or moderate disability.  On LEFS patient scored 26/80 demonstrating moderate functional limitation.  Given her significant LE weakness and impaired gait pattern, will plan more formalized balance testing next visit.  Daryan will benefit from skilled PT to address above deficits to improve mobility and activity tolerance with decreased pain interference.     OBJECTIVE IMPAIRMENTS: Abnormal gait, decreased activity tolerance, decreased balance, decreased knowledge of condition, decreased mobility, difficulty  walking, decreased ROM, decreased strength, increased fascial restrictions, impaired perceived functional ability, increased muscle spasms, impaired flexibility, impaired sensation, improper body mechanics, postural dysfunction, and pain.   ACTIVITY LIMITATIONS: carrying, lifting, bending, standing, squatting, sleeping, stairs, transfers, bed mobility, bathing, toileting, dressing, reach over head, hygiene/grooming, locomotion level, and caring for others  PARTICIPATION LIMITATIONS: meal prep, cleaning, laundry, driving, shopping, community activity, yard work, and church  PERSONAL FACTORS: Age, Past/current experiences, Time since onset of injury/illness/exacerbation, and 3+ comorbidities: L2-3 XLIF/PSF 05/24/22; C5-7 ACDF 09/21/20; idiopathic progressive neuropathy; hypthyroidism; DDD; remote h/o back surgery at age 47; h/o scoliosis; 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   REHAB POTENTIAL: Good  CLINICAL DECISION MAKING: Unstable/unpredictable  EVALUATION COMPLEXITY: High   GOALS: Goals reviewed with patient? Yes  SHORT TERM GOALS: Target date: 06/15/2024  Patient will be independent with initial HEP to improve outcomes and  carryover.  Baseline: Initial HEP provided on eval Goal status: INITIAL  2.  Patient will report 25% improvement in low back pain to improve QOL. Baseline: 2/10 on eval, up to 10/10 Goal status: INITIAL  3.  Complete standardized balance assessment and update LTG's as indicated. Baseline:  Goal status: INITIAL   LONG TERM GOALS: Target date: 07/20/2024  Patient will be independent with ongoing/advanced HEP for self-management at home.  Baseline:  Goal status: INITIAL  2.  Patient will report 50-75% improvement in R hip/pelvic pain to improve QOL.  Baseline: 2/10 on eval, up to 10/10 Goal status: INITIAL  3.   Patient will demonstrate improved B proximal LE strength to >/= 4 to 4+/5 for improved stability and ease of mobility. Baseline: Refer to above LE MMT table Goal status: INITIAL  4.  Patient will report >/= 35/80 on LEFS (MCID = 9 pts) to demonstrate improved functional ability. Baseline: 26 / 80 = 32.5 % Goal status: INITIAL   5. Patient will report </= 22% on Modified Oswestry (MCID = 12%) to demonstrate improved functional ability with decreased pain interference. Baseline: 17 / 50 = 34.0 % Goal status: INITIAL  6.  Patient will resume her normal walking routine w/o increased pain to allow for  improved mobility and activity tolerance. Baseline: R ischial pain limiting walking tolerance Goal status: INITIAL  7.  Patient will improve Berg score by at least 8 points or to >/= 45/56 (baseline as of prior D/C from PT) to improve safety and stability with ADLs in standing and reduce risk for falls.  Baseline: TBA Goal status: INITIAL  8.  Patient will improve FGA score by at least 4 points or to >/= 19/30 (baseline as of prior D/C from PT) to improve gait stability and reduce risk for falls.   Baseline: TBA Goal status: INITIAL   9.  Patient will improve gait velocity to at least 2.62 ft/sec for improved gait efficiency and safety with community ambulation.  Baseline: TBA Goal status: INITIAL   PLAN:  PT FREQUENCY: 2x/week  PT DURATION: 10 weeks  PLANNED INTERVENTIONS: 97164- PT Re-evaluation, 97750- Physical Performance Testing, 97110-Therapeutic exercises, 97530- Therapeutic activity, [id number]- Neuromuscular re-education, 97535- Self Care, 02859- Manual therapy, [phone]- Gait training, [phone]- Electrical stimulation (unattended), 97035- Ultrasound, 02966- Ionotophoresis 4mg /ml Dexamethasone, 79439 (1-2 muscles), 20561 (3+ muscles)- Dry Needling, Patient/Family education, Balance training, Stair training, Taping, Joint mobilization, DME instructions, Cryotherapy, and Moist heat  PLAN  FOR NEXT SESSION: Complete standardized balance testing; review initial HEP stretches and progress to gentle proximal LE strengthening; MT +/- TPDN to address abnormal muscle tension in glutes and piriformis   Elijah CHRISTELLA Hidden, PT 05/11/2024, 6:24 PM

## 2024-05-04 NOTE — Telephone Encounter (Signed)
 I spoke with the patient and she denies any pain or discomfort. I asked  the patient to use and also bring all drops to the post op appointment and to sleep with shield at night as directed.  I instructed the patient to call the office with any questions or concerns. Pt expresses understanding.

## 2024-05-11 ENCOUNTER — Ambulatory Visit: Attending: Orthopaedic Surgery | Admitting: Physical Therapy

## 2024-05-11 ENCOUNTER — Encounter: Payer: Self-pay | Admitting: Physical Therapy

## 2024-05-11 DIAGNOSIS — R2689 Other abnormalities of gait and mobility: Secondary | ICD-10-CM | POA: Insufficient documentation

## 2024-05-11 DIAGNOSIS — M5416 Radiculopathy, lumbar region: Secondary | ICD-10-CM | POA: Diagnosis present

## 2024-05-11 DIAGNOSIS — M6281 Muscle weakness (generalized): Secondary | ICD-10-CM | POA: Diagnosis present

## 2024-05-11 DIAGNOSIS — R252 Cramp and spasm: Secondary | ICD-10-CM | POA: Insufficient documentation

## 2024-05-11 DIAGNOSIS — M25551 Pain in right hip: Secondary | ICD-10-CM | POA: Diagnosis present

## 2024-05-11 DIAGNOSIS — R2681 Unsteadiness on feet: Secondary | ICD-10-CM | POA: Diagnosis present

## 2024-05-13 NOTE — Progress Notes (Signed)
 [facility] Ophthalmology - [facility] Visit Note 05/13/24     CHIEF COMPLAINT Patient presents for Post-op Exam   HISTORY OF PRESENT ILLNESS: Coral Thersa Mohiuddin is a 83 y.o. old female who presents to the clinic today for:   HPI   S/p PC IOL left eye 05/04/24 Pre-op OD Pt sts he is seeing well left eye. Sts va left eye is amazing! Using Moxi and Pred qid left eye. C/o difficult seeing street signs, store signs, traffic lights. Seeing halos/star burst around lights. Bothered by glare from lights and from headlights. Limits night driving due to glare from lights. Hazy and blurry va. Colors look dull and dingy. Pt wishes to proceed with sx.  Last edited by Olam Maranda Ruder, COA on 05/13/2024 11:05 AM.     CURRENT MEDICATIONS: Medications Ordered Prior to Encounter[1]  Referring physician: No referring provider defined for this encounter. ALLERGIES Allergies[2] PAST MEDICAL HISTORY Medical History[3] Surgical History[4] FAMILY HISTORY Family History[5] SOCIAL HISTORY Social History[6]    OPHTHALMIC EXAM:  Base Eye Exam     Visual Acuity (Snellen - Linear)       Right Left   Dist [place] 20/400 20/20 -2   Dist ph [place] 20/70 +2   Didn't bring glasses.         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  Patient Cooperation: good.  Reliability: Reliable.  Power: see scanned surgery documents.   Left Eye  Patient Cooperation: good.  Reliability: Reliable.   Notes AXIAL LENGTH (mm):   RIGHT EYE: 25.78           LEFT EYE: 25.44  KERATOMETRY  RIGHT EYE:44.52@087  / 45.08@177                                   LEFT EYE:44.98@116  / 46.68@026   ACD (mm):  RIGHT EYE: 3.16     LEFT EYE: 3.02   PORG:PLANO  PUPILS: right eye: 6.3 mm, left  eye: 3.0 mm    ASSESSMENT/PLAN:  1. Nuclear sclerotic cataract of left eye  IOL Biometry - OU - Both Eyes    2. Pseudophakia of left eye       1 week s/p cataract surgery, LEFT eye, doing well  - Stop Moxifloxacin drops - Continue to use Prednisolone acetate 1%:  Decrease to 1 drop 2 times daily until returning  - No longer need the shield at bedtime or when sleeping - Patient to call immediately if decreased vision, pain, redness, or new flashes or floaters  -----------------------------------------------------------------------------------------------------------------  Cataract OD: Plan for Cataract Extraction with IOL Placement (CEIOL) OD   PATIENT IS CLEARED FOR SURGERY IN AN AMBULATORY SETTING.  The nature, risks, benefits, and alternatives to the procedure have been explained to the patient at length, including, but not limited to, those of hemorrhage, infection, retinal detachment, glaucoma. The patient accepted the risks and wishes to proceed with surgery. Informed consent was signed.  PRE- OP   Cataract Surgery Plan/Orders: CEIOL OD Discussed IOL options, including multifocal, toric,  and monofocal with mono-vision. Patient elects to proceed with MONOFOCAL IOL option after full discussion of advantages and disadvantages of each. Post Operative Refraction Goal (PORG): [place]   PRE-OP ORDERS: 1) NPO per O.R. Policy 2) Operative permit: Cataract extraction RIGHT eye with implant 3) Meds: Release signed and held pre-op orders. 4) Wash face with Cetaphil   POST-OP ORDERS: 1) Vital signs per anesthesia care unit policy. 2) PO fluids and diet as tolerated. 3) Discontinue IV when tolerating PO fluids and if no IV medications ordered. 4) Provide discharge instruction for routine cataract surgery. 5) See Dr. Janit at his office as scheduled. 6) Call Dr. Janit anytime PRN problems [phone].   Medication ordered this visit:  No orders of the defined types were placed in this encounter.   Return for Cataract surgery as scheduled, OD.  Patient Instructions   Follow pre-op instructions.    - Discontinue Moxifloxacin eye drops  - Continue Prednisolone: Decrease to one drop twice a day (morning and evening) until next visit - No longer need the shield at bedtime or when sleeping - May return to normal activities - Call immediately if decreased vision, pain, redness, or new flashes or floaters   Explained the diagnoses, plan, and follow up with the patient and they expressed understanding.  Patient expressed understanding of the importance of proper follow up care.   This document serves as a record of services personally performed by Ozell Janit, M.D.  It was created on their behalf by Veleria LITTIE People, COT, a trained medical scribe, and Certified Ophthalmic Tech (COT). During the course of documenting the history, physical exam and medical decision making, I was functioning as a Stage manager. The creation of this record is the provider's dictation and/or activities during the visit.  Abbreviations: M myopia (nearsighted); A astigmatism; H hyperopia (farsighted);  P presbyopia; Mrx spectacle prescription;  CTL contact lenses; OD right eye; OS left eye; OU both eyes  XT exotropia; ET esotropia; PEK punctate epithelial keratitis; PEE punctate epithelial erosions; DES dry eye syndrome; MGD meibomian gland dysfunction; ATs artificial tears; PFAT's preservative free artificial tears; NSC nuclear sclerotic cataract; PSC posterior subcapsular cataract; ERM epi-retinal membrane; PVD posterior vitreous detachment; RD retinal detachment; DM diabetes mellitus; DR diabetic retinopathy; NPDR non-proliferative diabetic retinopathy; PDR proliferative diabetic retinopathy; CSME clinically significant macular edema; DME diabetic macular edema; dbh dot blot hemorrhages; CWS cotton wool spot; POAG primary open angle glaucoma; C/D cup-to-disc  ratio; HVF humphrey visual field; GVF goldmann visual field; OCT optical coherence tomography; IOP intraocular pressure; BRVO Branch retinal vein occlusion; CRVO central retinal vein occlusion; CRAO central retinal artery occlusion; BRAO branch retinal artery occlusion; RT retinal tear; SB scleral buckle; PPV pars plana vitrectomy; VH Vitreous hemorrhage; PRP panretinal laser photocoagulation; IVK intravitreal kenalog; VMT vitreomacular traction; MH Macular hole;  NVD neovascularization of the disc; NVE neovascularization elsewhere; AREDS age related eye disease study; ARMD age related macular degeneration; POAG primary open angle glaucoma; EBMD epithelial/anterior basement membrane dystrophy; ACIOL anterior chamber intraocular lens; IOL intraocular lens; PCIOL posterior chamber intraocular lens; Phaco/IOL phacoemulsification with intraocular lens placement; PRK photorefractive keratectomy; LASIK laser assisted in situ keratomileusis; HTN hypertension; DM diabetes mellitus; COPD chronic obstructive pulmonary disease   Electronically signed by: Ozell Lemond Sar, MD 05/13/2024 1:30 PM        [1] Current Outpatient Medications on File Prior to Visit   Medication Sig Dispense Refill  . acetaminophen  (TYLENOL ) 325 mg tablet Take 975 mg by mouth.    . cyanocobalamin (VITAMIN B12) 1,000 mcg tablet Take 1,000 mcg by mouth daily.    . Eliquis  2.5 mg tab Take 1 tablet by mouth in the morning and 1 tablet in the evening.    . gabapentin (NEURONTIN) 600 mg tablet take 1/4 to 1 tablet by mouth at bedtime as directed if needed for RESTLESS LEG SYNDROME  0  . levothyroxine  (SYNTHROID ) 100 mcg tablet Take 88 mcg by mouth every morning.    . losartan  (COZAAR ) 25 mg tablet Take 12.5 mg by mouth daily.    . metoprolol  succinate (TOPROL  XL) 25 mg 24 hr tablet Take 12.5 mg by mouth daily.    SABRA moxifloxacin (VIGAMOX) 0.5 % ophthalmic solution Instill 1 drop in the LEFT eye 4 times a day. Start the drop 3 hours after you arrive home from surgery. 3 mL 1  . pravastatin (PRAVACHOL) 20 mg tablet take 1 tablet by mouth once daily at bedtime after meals  0  . prednisoLONE acetate (PRED FORTE) 1 % ophthalmic suspension Instill 1 drop in the LEFT eye 4 times a day. Start the drops 3 hours after you arrive home from surgery. 5 mL 2  . Repatha  SureClick 140 mg/mL pnij     . Vesicare 10 mg tablet   0   No current facility-administered medications on file prior to visit.  [2] Allergies Allergen Reactions  . Oxycodone Hcl GI Intolerance  . Primidone Other (See Comments)  . Propranolol Other (See Comments)  . Tolterodine Other (See Comments)  . Hydrocodone GI Intolerance  [3] Past Medical History: Diagnosis Date  . Cardiomyopathy    (CMD) 01/07/2022  . Cataract   . 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 Hyperlipidemia   . Hypertension    controlled with medications  . Hypothyroidism   . Map-dot-fingerprint corneal dystrophy   . Overactive bladder   . PVD (posterior vitreous detachment), right eye   [4] Past Surgical History: Procedure Laterality Date  . BACK SURGERY  1971   Procedure: BACK SURGERY  . CARDIAC CATHETERIZATION  01/07/2022  . CATARACT EXTRACTION W/   INTRAOCULAR LENS IMPLANT Left 05/04/2024   Ozell Sar, MD  [id number]  13.5D  . CATARACT EXTRACTION W/ INTRAOCULAR LENS IMPLANT Left 05/04/2024   EXTRACTION CATARACT LEFT EYE WITH INTRAOCULAR LENS INSERTION performed by Ozell Lemond Sar, MD at [facility]  . CERVICAL FUSION  2022  . HYSTERECTOMY   2008   Procedure: HYSTERECTOMY  .  LUMBAR FUSION  2023  [5] Family History Problem Relation Name Age of Onset  . Blindness Neg Hx    . Glaucoma Neg Hx    . Macular degeneration Neg Hx    [6] Social History Tobacco Use  . Smoking status: Never  . Smokeless tobacco: Never  Vaping Use  . Vaping status: Never Used  Substance Use Topics  . Alcohol use: Yes    Comment: very rarely  . Drug use: Never

## 2024-05-14 ENCOUNTER — Encounter: Payer: Self-pay | Admitting: Physical Therapy

## 2024-05-14 ENCOUNTER — Ambulatory Visit: Admitting: Physical Therapy

## 2024-05-14 DIAGNOSIS — R2689 Other abnormalities of gait and mobility: Secondary | ICD-10-CM

## 2024-05-14 DIAGNOSIS — M5416 Radiculopathy, lumbar region: Secondary | ICD-10-CM | POA: Diagnosis not present

## 2024-05-14 DIAGNOSIS — M25551 Pain in right hip: Secondary | ICD-10-CM

## 2024-05-14 DIAGNOSIS — R252 Cramp and spasm: Secondary | ICD-10-CM

## 2024-05-14 DIAGNOSIS — R2681 Unsteadiness on feet: Secondary | ICD-10-CM

## 2024-05-14 DIAGNOSIS — M6281 Muscle weakness (generalized): Secondary | ICD-10-CM

## 2024-05-14 NOTE — Therapy (Signed)
 OUTPATIENT PHYSICAL THERAPY TREATMENT   Patient Name: [name] Trujillo MRN: [id number] DOB:1941-01-29, 83 y.o., female Today's Date: 05/14/2024  END OF SESSION:  PT End of Session - 05/14/24 1019     Visit Number 2    Date for PT Re-Evaluation 07/20/24    Authorization Type [employer]    Authorization Time Period 05/11/24 - 08/09/24    Authorization - Visit Number 2    Authorization - Number of Visits 10    Progress Note Due on Visit 10    PT Start Time 1019    PT Stop Time 1106    PT Time Calculation (min) 47 min    Activity Tolerance Patient tolerated treatment well    Behavior During Therapy [facility] for tasks assessed/performed           Past Medical History:  Diagnosis Date   High cholesterol    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   She had ACUTE TAKOTSUBO (STRESS) CARDIOMYOPATHY with elevated Troponin Levels - this would be considered Demand Ischemia - Demand Infarction & NOT associated with ACS/CAD.     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  - Cath with NO CAD. ==> RESOLVED: f/u Echo 03/2022: EF 60-65%.   Past Surgical History:  Procedure Laterality Date   APPENDECTOMY     54-18 yo   BACK SURGERY     Age 23   CESAREAN SECTION     ECTOPIC PREGNANCY SURGERY     LAPAROSCOPIC HYSTERECTOMY     LEFT HEART CATH AND CORONARY ANGIOGRAPHY N/A 01/09/2022   Procedure: LEFT HEART CATH AND CORONARY ANGIOGRAPHY;  Surgeon: Wonda Sharper, MD;  Location: [facility] INVASIVE CV LAB;  Service: CV:: Widely patent coronaries with mild nonobstructive LAD plaquing.  Right dominant system.  Normal LVEDP.  Based on clinical presentation, findings are consistent with acute Takotsubo  Cardiomyopathy Syndrome   POSTERIOR FUSION LUMBAR SPINE  05/24/2022   [facility], [place]; [name], MD): L2-L3 XLIF, L2-L3 POSTERIOR DECOMPRESSION AND FUSION   TRANSTHORACIC ECHOCARDIOGRAM  01/08/2022   Severely decreased LV function-EF 25 to 30%.  Mid to apical (mostly anterior) with normal basal motion.  GR 2 DD-elevated LAP.  Mildly dilated LA.  Aortic sclerosis with no stenosis.  No AI.  Normal MV with mild to moderate TR.  Mildly elevated RAP, and PAP (estimated 49 mg). If LAD CAD ruled out - consistent with Takotsubo CM Syndrome.   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) 12/30/2023   New onset atrial fibrillation (HCC) 12/30/2023   Myopia of both eyes with astigmatism and presbyopia 05/06/2023   Dry eye syndrome of both eyes 05/06/2023   Mild tricuspid regurgitation 05/06/2022   Mild mitral regurgitation 05/06/2022   Hypertension 05/06/2022   History of seizure 05/06/2022   Takotsubo cardiomyopathy-resolved 01/10/2022   Hyperlipidemia with target LDL less  than 100 01/10/2022   Hypothyroid 01/10/2022   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) 01/08/2022   Numbness in both legs 04/10/2021   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   Posterior vitreous detachment of both eyes 02/05/2018   Ventricular premature beats 10/31/2015   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   Pure hypercholesterolemia 06/28/2014   Plantar fasciitis 06/28/2014    PCP: Windy Coy, MD   REFERRING PROVIDER: Vernetta Lonni GRADE, MD  REFERRING DIAG:  [phone] (ICD-10-CM) - Pain in right hip   M54.50 (ICD-10-CM) - Low back pain, unspecified  Eval and treat low back/right pelvis and right hip/ishium Any modalities per therapist   THERAPY DIAG:  Radiculopathy, lumbar region  Pain in right hip  Muscle weakness (generalized)  Cramp and spasm  Other abnormalities of gait and mobility  Unsteadiness on feet  RATIONALE FOR EVALUATION AND TREATMENT: Rehabilitation  ONSET DATE: Memorial Day weekend  NEXT MD VISIT: 05/20/2024   SUBJECTIVE:                                                                                                                                                                                                         SUBJECTIVE STATEMENT: Pt notes some increased soreness in her R hip/buttock today after attempting the HEP stretches on the R, no issues with stretches on the L.  She notes feeling of uncontrolled descent when attempting to sit down in a chair which also seems to trigger her pain.  EVAL: Linet is well know to me from several prior PT episodes for foot drop, cervical and lumbar radiculopathy.  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  Woke up the next morning with severe R hip/buttock pain.  Pain mostly localized to sciatic and ischial area of her R buttock.  PCP provider told her it was likely hip bursitis but ortho MD feels like it is more likely radicular from her back.  Some relief from steroid injection around her right hip ischial area provided by ortho MD but pain has never fully gone away.   She does have a significant lurch in her gait which is somewhat more pronounced than on prior PT episodes with this PT.  PAIN: Are you having pain? Yes: NPRS scale: 3/10 currently, up to 10/10 Pain location: R lower buttock most of the time, brief B lower back a few days, denies shooting pain Pain description: pounding  Aggravating factors: walking, positional  changes  Relieving factors:  Aleve or Advil (doesn't take her Eliquis  when taking NSAIDs)  PERTINENT HISTORY:  L2-3 XLIF/PSF 05/24/22; C5-7 ACDF 09/21/20; idiopathic progressive neuropathy; hypthyroidism; DDD; remote h/o back surgery at age 61; h/o scoliosis; Takotsubo cardiomyopathy 01/10/22; CAD; paroxysmal afib; mitral and tricuspid regurgitation; myalgia due to statin; recent cataract surgery  PRECAUTIONS: Fall  RED FLAGS: Bowel or bladder incontinence: Yes: chronic bladder issues, bowel urgency - somewhat worse than before   WEIGHT BEARING RESTRICTIONS: No  FALLS:  Has patient fallen in last 6 months? No  LIVING ENVIRONMENT: Lives with: lives with their spouse Lives in: House/apartment Stairs: Yes: External: 5-6 steps; on left going up Has following equipment at home: Walker - 2 wheeled, shower chair, and hiking/walking poles  OCCUPATION: Retired Runner, broadcasting/film/video  PLOF: Independent and Leisure: walking ~30 minutes 1x/day (3x around the court near where she lives); sewing/quilting; likes working in the yard    PATIENT GOALS: To start walking again w/o doing more damage.   OBJECTIVE: (objective measures completed at initial evaluation unless otherwise dated)  DIAGNOSTIC FINDINGS:  04/21/24 - XR RIGHT HIP: An AP pelvis and lateral of the right hip shows well-maintained joint space with no cortical irregularities around the hip or the trochanteric area of the hip.  The lower lumbar spine shows severe degenerative changes at L4-L5 and L5-S1 on the AP view.   08/27/22 - LUMBAR SPINE - COMPLETE 4+ VIEW FINDINGS: There is mild levoconvex curvature of the lumbar spine, unchanged. Alignment is anatomic. There are bilateral transpedicular screws in posterior fusion rods present at L2-L3, new from prior. Disc spacers present. Alignment is anatomic. No fractures are identified. There is moderate severe disc space narrowing at L4-L5 and L5-S1 as well as L1-L2 similar to the prior study. There are  atherosclerotic calcifications of the aorta.   IMPRESSION: 1. Status post posterior fusion at L2-L3. Alignment is anatomic. 2. Multilevel degenerative disc disease, similar to prior study.  PATIENT SURVEYS:  Modified Oswestry:  MODIFIED OSWESTRY DISABILITY SCALE  Date:  05/11/2024  Pain intensity 2 =  Pain medication provides me with complete relief from pain.  2. Personal care (washing, dressing, etc.) 1 =  I can take care of myself normally, but it increases my pain.  3. Lifting 4 = I can lift only very light weights  4. Walking 1 = Pain prevents me from walking more than 1 mile.  5. Sitting 1 =  I can only sit in my favorite chair as long as I like.  6. Standing 2 =  Pain prevents me from standing more than 1 hour  7. Sleeping 2 =  Even when I take pain medication, I sleep less than 6 hours  8. Social Life 2 = Pain prevents me from participating in more energetic activities (eg. sports, dancing).  9. Traveling 2 =  My pain restricts my travel over 2 hours.  10. Employment/ Homemaking 2 = I can perform most of my homemaking/job duties, but pain prevents me from performing more physically stressful activities (eg, lifting, vacuuming).  Total 17 / 50 = 34.0 %  Interpretation Moderate disability   Interpretation of scores: Score Category Description  0-20% Minimal Disability The patient can cope with most living activities. Usually no treatment is indicated apart from advice on lifting, sitting and exercise  21-40% Moderate Disability The patient experiences more pain and difficulty with sitting, lifting and standing. Travel and social life are more difficult and they may be disabled from work. Personal care, sexual activity and sleeping are not  grossly affected, and the patient can usually be managed by conservative means  41-60% Severe Disability Pain remains the main problem in this group, but activities of daily living are affected. These patients require a detailed investigation  61-80%  Crippled Back pain impinges on all aspects of the patient's life. Positive intervention is required  81-100% Bed-bound These patients are either bed-bound or exaggerating their symptoms  Bluford FORBES Zoe DELENA Karon DELENA, et al. Surgery versus conservative management of stable thoracolumbar fracture: the PRESTO feasibility RCT. [place]): [employer]; 2021 Nov. [facility] Technology Assessment, No. 25.62.) Appendix 3, Oswestry Disability Index category descriptors. Available from: [employer]el[employer]cz Minimally Clinically Important Difference (MCID) = 12.8%  LEFS  Extreme difficulty/unable (0), Quite a bit of difficulty (1), Moderate difficulty (2), Little difficulty (3), No difficulty (4) Survey date:  05/11/24  Any of your usual work, housework or school activities 1  2. Usual hobbies, recreational or sporting activities 2  3. Getting into/out of the bath 2  4. Walking between rooms 2  5. Putting on socks/shoes 2  6. Squatting  0  7. Lifting an object, like a bag of groceries from the floor 2  8. Performing light activities around your home 2  9. Performing heavy activities around your home 0  10. Getting into/out of a car 2  11. Walking 2 blocks 0  12. Walking 1 mile 2  13. Going up/down 10 stairs (1 flight) 2  14. Standing for 1 hour 2  15.  sitting for 1 hour 3  16. Running on even ground 0  17. Running on uneven ground 0  18. Making sharp turns while running fast 0  19. Hopping  0  20. Rolling over in bed 2  Score total:   26 / 80 = 32.5 %  Functional limitation: Moderate     SCREENING FOR RED FLAGS: Bowel or bladder incontinence: Yes: chronic bladder issues, bowel urgency - somewhat worse than before Spinal tumors: No Cauda equina syndrome: No Compression fracture: No Abdominal aneurysm: No  COGNITION:  Overall cognitive status: Within functional limits for tasks assessed    SENSATION: WFL Except B distal LE neuropathy  POSTURE:   rounded shoulders, forward head, right pelvic obliquity, and scoliosis  PALPATION: Increased muscle tension in L>R lumbar paraspinals and R>L glutes and piriformis with TTP over inferior glutes and piriformis near hamstring origin  LUMBAR ROM: *Lumbar ROM assessed with UE support for balance and close SBA/CGA of PT  Active  Eval  Flexion Hands to distal shins  Extension 50% limited  Right lateral flexion Hand to upper thigh - p!  Left lateral flexion Hand to lateral knee  Right rotation 50% limited  Left rotation 30% limited - p!  (Blank rows = not tested)  MUSCLE LENGTH: Hamstrings: mild/mod tight B ITB: mild tight B Piriformis: mod/severe tight B Hip IR: mod tight B Hip flexors: mild tight R Quads: mild tight R>L Heelcord: mild/mod tight B  LOWER EXTREMITY ROM:    Grossly WFL other than limitations indicated above due to muscle tightness   LOWER EXTREMITY MMT:    MMT Right eval Left eval  Hip flexion 3+ 4-  Hip extension 4- 4  Hip abduction 3+ 4-  Hip adduction 3+ 4-  Hip internal rotation 4- 4-  Hip external rotation 3+ 3+  Knee flexion 4 4  Knee extension 4 4  Ankle dorsiflexion 3- 3-  Ankle plantarflexion    Ankle inversion    Ankle eversion     (  Blank rows = not tested)  FUNCTIONAL TESTS: (Assessment completed 05/14/24 unless otherwise indicated) 5 times sit to stand: TBA Timed up and go (TUG): 13.75 sec with single hiking pole 10 meter walk test: 16.00 sec with single hiking pole Gait speed: 2.05 ft/sec with single hiking pole [place] Balance Scale: TBA Functional gait assessment: 10/30, < 19 = high risk fall    Standardized testing results as of discharge from latest PT episode (11/11/23): 5xSTS = 11.84 sec  = 13.63 sec w/o AD Gait speed = 2.41 ft/sec w/o AD Berg = 45/56; scores of 37-45 indicate significant (>80%) fall risk  FGA = 19/30; scores of 19-24 indicate a medium risk for falls   GAIT: Distance walked: Clinic distances Assistive device  utilized: Single walking/trekking pole on R Level of assistance: SBA Gait pattern: Increased sway, step through pattern, decreased stride length, trendelenburg, lateral hip instability, poor foot clearance- Right, and poor foot clearance- Left Comments: B foot slap   TODAY'S TREATMENT:   05/14/2024  THERAPEUTIC ACTIVITIES: To improve functional performance.  Demonstration, verbal and tactile cues throughout for technique. 5xSTS: Deferred due to increased pain with uncontrolled descent upon sitting TUG: 13.75 sec with single hiking pole : 16.00 sec with single hiking pole Gait speed: 2.05 ft/sec with single hiking pole  PHYSICAL PERFORMANCE TEST or MEASUREMENT: Functional Gait  Assessment  Gait Level Surface Walks 20 ft, slow speed, abnormal gait pattern, evidence for imbalance or deviates 10-15 in outside of the 12 in walkway width. Requires more than 7 sec to ambulate 20 ft.   Change in Gait Speed Makes only minor adjustments to walking speed, or accomplishes a change in speed with significant gait deviations, deviates 10-15 in outside the 12 in walkway width, or changes speed but loses balance but is able to recover and continue walking.   Gait with Horizontal Head Turns Performs head turns with moderate changes in gait velocity, slows down, deviates 10-15 in outside 12 in walkway width but recovers, can continue to walk.   Gait with Vertical Head Turns Performs task with moderate change in gait velocity, slows down, deviates 10-15 in outside 12 in walkway width but recovers, can continue to walk.   Gait and Pivot Turn Turns slowly, requires verbal cueing, or requires several small steps to catch balance following turn and stop   Step Over Obstacle Is able to step over one shoe box (4.5 in total height) without changing gait speed. No evidence of imbalance.   Gait with Narrow Base of Support Ambulates less than 4 steps heel to toe or cannot perform without assistance.   Gait with Eyes  Closed Cannot walk 20 ft without assistance, severe gait deviations or imbalance, deviates greater than 15 in outside 12 in walkway width or will not attempt task.   Ambulating Backwards Walks 20 ft, slow speed, abnormal gait pattern, evidence for imbalance, deviates 10-15 in outside 12 in walkway width.   Steps Alternating feet, must use rail.   Total Score 10   FGA comment: < 19 = high risk fall      THERAPEUTIC EXERCISE: To improve ROM and flexibility.  Demonstration, verbal and tactile cues throughout for technique.  Hooklying R HS stretch with strap hamstring 2 x 30 Supine gluteus stretch 2 x 30 bil Hooklying figure-4 piriformis stretch x 30 on L, deferred d/t increased pain on R Hooklying KTOS piriformis stretch x 30 on L, deferred d/t increased pain on R Side-sitting R modified pigeon piriformis stretch 2 x 30 -  good stretch noted without increased pain Seated R KTOS piriformis stretch 2 x 30 - good stretch noted without increased pain  SELF CARE:   Modified HEP to address concerns regarding increased R buttock/hip pain with piriformis stretching, with patient reporting better tolerance for seated piriformis stretching.  Patient cautioned not to force any painful ROM/stretching and report back to PT next visit if HEP continues to irritate or cause increased pain. Provided instruction in self-STM techniques to R glutes/piriformis using tennis ball against wall or in chair.    05/04/2024  SELF CARE:  Reviewed eval findings and role of PT in addressing identified deficits as well as instruction in initial HEP (see below).    PATIENT EDUCATION:  Education details: standardized testing results and interpretation, HEP review, HEP modification - modified supine/hooklying piriformis stretches to sitting, and self-STM techniques to R glutes/piriformis using tennis ball on wall or in sitting  Person educated: Patient Education method: Explanation, Demonstration, Verbal cues, and  Handouts Education comprehension: verbalized understanding, returned demonstration, and verbal cues required  HOME EXERCISE PROGRAM: Access Code: [facility] URL: [email or web address] Date: 05/14/2024 Prepared by: Elijah Hidden  Exercises - Hooklying Hamstring Stretch with Strap  - 2 x daily - 7 x weekly - 3 reps - 30 sec hold - Supine Gluteus Stretch  - 2 x daily - 7 x weekly - 3 reps - 30 sec hold - Supine Figure 4 Piriformis Stretch  - 2 x daily - 7 x weekly - 3 reps - 30 sec hold - Supine Piriformis Stretch with Foot on Ground  - 2 x daily - 7 x weekly - 3 reps - 30 sec hold - Seated Table Piriformis Stretch  - 2 x daily - 7 x weekly - 3 reps - 30 sec hold - Seated Piriformis Stretch  - 2 x daily - 7 x weekly - 3 reps - 30 sec hold - Glute Max Release With [employer] Against Wall  - 1 x daily - 7 x weekly - 1-2 min hold   ASSESSMENT:  CLINICAL IMPRESSION: Initiated standardized balance testing with current scores indicating a decline in function/balance and increased risk for falls as compared to status as of DC from PT earlier this year in March, most notably in FGA score drop from 19/30 to 10/30.  Ryelynn reports increased pain in R hip/buttock with attempts at home hooklying piriformis stretches, therefore reviewed technique to ensure proper form however still having increased pain.  Reviewed alignment and technique with HEP stretches with patient acknowledging sensation of good stretch when performed on L but increased pain with performance of on R.  Modified position from supine to sitting with slight variation on piriformis stretches with patient reporting better stretch on R without increased pain.  Rheda reports R hip/buttock pain remains very localized with increased TTP, therefore provided instruction in self-STM and manual TPR techniques to using tennis ball on wall or in sitting.  She will benefit from continued skilled PT to address ongoing flexibility, strength  and balance deficits to improve mobility and activity tolerance with decreased pain interference and decreased risk for falls.   EVAL: Ernie Schmuck is a 83 y.o. female who was referred to physical therapy for evaluation and treatment for R hip/buttock pain presumed to be the result of lumbar radiculopathy.  Patient reports onset of R buttock pain beginning Memorial Day weekend 2025.  Pain is worse with walking and positional changes.  Patient has deficits in hip ROM, proximal LE flexibility, B LE strength, abnormal  posture, and TTP with abnormal muscle tension in R lower glutes and piriformis which are interfering with ADLs and are impacting quality of life.  On Modified Oswestry patient scored 17/50 demonstrating 34% or moderate disability.  On LEFS patient scored 26/80 demonstrating moderate functional limitation.  Given her significant LE weakness and impaired gait pattern, will plan more formalized balance testing next visit.  Haydon will benefit from skilled PT to address above deficits to improve mobility and activity tolerance with decreased pain interference.     OBJECTIVE IMPAIRMENTS: Abnormal gait, decreased activity tolerance, decreased balance, decreased knowledge of condition, decreased mobility, difficulty walking, decreased ROM, decreased strength, increased fascial restrictions, impaired perceived functional ability, increased muscle spasms, impaired flexibility, impaired sensation, improper body mechanics, postural dysfunction, and pain.   ACTIVITY LIMITATIONS: carrying, lifting, bending, standing, squatting, sleeping, stairs, transfers, bed mobility, bathing, toileting, dressing, reach over head, hygiene/grooming, locomotion level, and caring for others  PARTICIPATION LIMITATIONS: meal prep, cleaning, laundry, driving, shopping, community activity, yard work, and church  PERSONAL FACTORS: Age, Past/current experiences, Time since onset of injury/illness/exacerbation, and 3+  comorbidities: L2-3 XLIF/PSF 05/24/22; C5-7 ACDF 09/21/20; idiopathic progressive neuropathy; hypthyroidism; DDD; remote h/o back surgery at age 30; h/o scoliosis; 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   REHAB POTENTIAL: Good  CLINICAL DECISION MAKING: Unstable/unpredictable  EVALUATION COMPLEXITY: High   GOALS: Goals reviewed with patient? Yes  SHORT TERM GOALS: Target date: 06/15/2024  Patient will be independent with initial HEP to improve outcomes and carryover.  Baseline: Initial HEP provided on eval Goal status: IN PROGRESS - 05/14/24 - HEP reviewed and modified to address reported increased pain with piriformis stretching  2.  Patient will report 25% improvement in low back pain to improve QOL. Baseline: 2/10 on eval, up to 10/10 Goal status: INITIAL  3.  Complete standardized balance assessment and update LTG's as indicated. Baseline:  Goal status: IN PROGRESS - 05/14/24 - , TUG and FGA completed; Berg and 5xSTS pending   LONG TERM GOALS: Target date: 07/20/2024  Patient will be independent with ongoing/advanced HEP for self-management at home.  Baseline:  Goal status: INITIAL  2.  Patient will report 50-75% improvement in R hip/pelvic pain to improve QOL.  Baseline: 2/10 on eval, up to 10/10 Goal status: INITIAL  3.  Patient will demonstrate improved B proximal LE strength to >/= 4 to 4+/5 for improved stability and ease of mobility. Baseline: Refer to above LE MMT table Goal status: INITIAL  4.  Patient will report >/= 35/80 on LEFS (MCID = 9 pts) to demonstrate improved functional ability. Baseline: 26 / 80 = 32.5 % Goal status: INITIAL   5. Patient will report </= 22% on Modified Oswestry (MCID = 12%) to demonstrate improved functional ability with decreased pain interference. Baseline: 17 / 50 = 34.0 % Goal  status: INITIAL  6.  Patient will resume her normal walking routine w/o increased pain to allow for  improved mobility and activity tolerance. Baseline: R ischial pain limiting walking tolerance Goal status: INITIAL  7.  Patient will improve Berg score by at least 8 points or to >/= 45/56 (baseline as of prior D/C from PT) to improve safety and stability with ADLs in standing and reduce risk for falls.  Baseline: TBA Goal status: INITIAL  8.  Patient will improve FGA score by at least 4 points or to >/= 19/30 (baseline as of prior D/C  from PT) to improve gait stability and reduce risk for falls.   Baseline: TBA Goal status: INITIAL   9.  Patient will improve gait velocity to at least 2.62 ft/sec for improved gait efficiency and safety with community ambulation.  Baseline: TBA Goal status: INITIAL   PLAN:  PT FREQUENCY: 2x/week  PT DURATION: 10 weeks  PLANNED INTERVENTIONS: 02835- PT Re-evaluation, 97750- Physical Performance Testing, 97110-Therapeutic exercises, 97530- Therapeutic activity, [id number]- Neuromuscular re-education, 97535- Self Care, 02859- Manual therapy, [id number]- Gait training, [phone]- Electrical stimulation (unattended), [phone]- Ultrasound, 02966- Ionotophoresis 4mg /ml Dexamethasone, 79439 (1-2 muscles), 20561 (3+ muscles)- Dry Needling, Patient/Family education, Balance training, Stair training, Taping, Joint mobilization, DME instructions, Cryotherapy, and Moist heat  PLAN FOR NEXT SESSION: Complete remaining standardized balance testing as able - 5xSTS and Berg; review modified initial HEP stretches and progress to gentle lumbopelvic/proximal LE strengthening as pain allows; MT +/- TPDN to address abnormal muscle tension in glutes and piriformis   Elijah CHRISTELLA Hidden, PT 05/14/2024, 11:18 AM

## 2024-05-19 ENCOUNTER — Ambulatory Visit

## 2024-05-19 ENCOUNTER — Ambulatory Visit: Admitting: Orthopaedic Surgery

## 2024-05-19 DIAGNOSIS — R252 Cramp and spasm: Secondary | ICD-10-CM

## 2024-05-19 DIAGNOSIS — R2681 Unsteadiness on feet: Secondary | ICD-10-CM

## 2024-05-19 DIAGNOSIS — M25551 Pain in right hip: Secondary | ICD-10-CM

## 2024-05-19 DIAGNOSIS — M5416 Radiculopathy, lumbar region: Secondary | ICD-10-CM | POA: Diagnosis not present

## 2024-05-19 DIAGNOSIS — M6281 Muscle weakness (generalized): Secondary | ICD-10-CM

## 2024-05-19 DIAGNOSIS — R2689 Other abnormalities of gait and mobility: Secondary | ICD-10-CM

## 2024-05-19 NOTE — Therapy (Signed)
 OUTPATIENT PHYSICAL THERAPY TREATMENT   Patient Name: [name] Trujillo MRN: [id number] DOB:1940-10-23, 83 y.o., female Today's Date: 05/19/2024  END OF SESSION:  PT End of Session - 05/19/24 1528     Visit Number 3    Date for PT Re-Evaluation 07/20/24    Authorization Type [employer]    Authorization Time Period 05/11/24 - 08/09/24    Authorization - Visit Number 3    Authorization - Number of Visits 10    Progress Note Due on Visit 10    PT Start Time 1523    PT Stop Time 1613    PT Time Calculation (min) 50 min    Activity Tolerance Patient tolerated treatment well    Behavior During Therapy [facility] for tasks assessed/performed            Past Medical History:  Diagnosis Date   High cholesterol    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   She had ACUTE TAKOTSUBO (STRESS) CARDIOMYOPATHY with elevated Troponin Levels - this would be considered Demand Ischemia - Demand Infarction & NOT associated with ACS/CAD.     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  - Cath with NO CAD. ==> RESOLVED: f/u Echo 03/2022: EF 60-65%.   Past Surgical History:  Procedure Laterality Date   APPENDECTOMY     37-18 yo   BACK SURGERY     Age 66   CESAREAN SECTION     ECTOPIC PREGNANCY SURGERY     LAPAROSCOPIC HYSTERECTOMY     LEFT HEART CATH AND CORONARY ANGIOGRAPHY N/A 01/09/2022   Procedure: LEFT HEART CATH AND CORONARY ANGIOGRAPHY;  Surgeon: Wonda Sharper, MD;  Location: [facility] INVASIVE CV LAB;  Service: CV:: Widely patent coronaries with mild nonobstructive LAD plaquing.  Right dominant system.  Normal LVEDP.  Based on clinical presentation, findings are consistent with acute Takotsubo  Cardiomyopathy Syndrome   POSTERIOR FUSION LUMBAR SPINE  05/24/2022   [facility], [place]; [name], MD): L2-L3 XLIF, L2-L3 POSTERIOR DECOMPRESSION AND FUSION   TRANSTHORACIC ECHOCARDIOGRAM  01/08/2022   Severely decreased LV function-EF 25 to 30%.  Mid to apical (mostly anterior) with normal basal motion.  GR 2 DD-elevated LAP.  Mildly dilated LA.  Aortic sclerosis with no stenosis.  No AI.  Normal MV with mild to moderate TR.  Mildly elevated RAP, and PAP (estimated 49 mg). If LAD CAD ruled out - consistent with Takotsubo CM Syndrome.   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) 12/30/2023   New onset atrial fibrillation (HCC) 12/30/2023   Myopia of both eyes with astigmatism and presbyopia 05/06/2023   Dry eye syndrome of both eyes 05/06/2023   Mild tricuspid regurgitation 05/06/2022   Mild mitral regurgitation 05/06/2022   Hypertension 05/06/2022   History of seizure 05/06/2022   Takotsubo cardiomyopathy-resolved 01/10/2022   Hyperlipidemia with target LDL  less than 100 01/10/2022   Hypothyroid 01/10/2022   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) 01/08/2022   Numbness in both legs 04/10/2021   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   Posterior vitreous detachment of both eyes 02/05/2018   Ventricular premature beats 10/31/2015   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   Pure hypercholesterolemia 06/28/2014   Plantar fasciitis 06/28/2014    PCP: Windy Coy, MD   REFERRING PROVIDER: Vernetta Lonni GRADE, MD  REFERRING DIAG:  [phone] (ICD-10-CM) - Pain in right hip   M54.50 (ICD-10-CM) - Low back pain, unspecified  Eval and treat low back/right pelvis and right hip/ishium Any modalities per therapist   THERAPY DIAG:  Radiculopathy, lumbar region  Pain in right hip  Muscle weakness (generalized)  Cramp and spasm  Other abnormalities of gait and mobility  Unsteadiness on feet  RATIONALE FOR EVALUATION AND TREATMENT: Rehabilitation  ONSET DATE: Memorial Day weekend  NEXT MD VISIT: 05/20/2024   SUBJECTIVE:                                                                                                                                                                                                         SUBJECTIVE STATEMENT: Pt reports pain/soreness is much better than it was.  EVAL: March is well know to me from several prior PT episodes for foot drop, cervical and lumbar radiculopathy.  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  Woke up the next morning with severe R hip/buttock pain.  Pain mostly localized to sciatic and ischial area of her R buttock.  PCP provider told her it was likely hip bursitis but ortho MD feels like it is more likely radicular from her back.  Some relief from steroid injection around her right hip ischial area provided by ortho MD but pain has never fully gone away.   She does have a significant lurch in her gait which is somewhat more pronounced than on prior PT episodes with this PT.  PAIN: Are you having pain? Yes: NPRS scale: 3/10 currently, up to 10/10 Pain location: R lower buttock most of the time, brief B lower back a few days, denies shooting pain Pain description: pounding  Aggravating factors: walking, positional changes  Relieving factors: Aleve or Advil (doesn't take her Eliquis  when taking NSAIDs)  PERTINENT HISTORY:  L2-3 XLIF/PSF 05/24/22; C5-7 ACDF 09/21/20; idiopathic progressive neuropathy; hypthyroidism; DDD;  remote h/o back surgery at age 47; h/o scoliosis; Takotsubo cardiomyopathy 01/10/22;  CAD; paroxysmal afib; mitral and tricuspid regurgitation; myalgia due to statin; recent cataract surgery  PRECAUTIONS: Fall  RED FLAGS: Bowel or bladder incontinence: Yes: chronic bladder issues, bowel urgency - somewhat worse than before   WEIGHT BEARING RESTRICTIONS: No  FALLS:  Has patient fallen in last 6 months? No  LIVING ENVIRONMENT: Lives with: lives with their spouse Lives in: House/apartment Stairs: Yes: External: 5-6 steps; on left going up Has following equipment at home: Walker - 2 wheeled, shower chair, and hiking/walking poles  OCCUPATION: Retired Runner, broadcasting/film/video  PLOF: Independent and Leisure: walking ~30 minutes 1x/day (3x around the court near where she lives); sewing/quilting; likes working in the yard    PATIENT GOALS: To start walking again w/o doing more damage.   OBJECTIVE: (objective measures completed at initial evaluation unless otherwise dated)  DIAGNOSTIC FINDINGS:  04/21/24 - XR RIGHT HIP: An AP pelvis and lateral of the right hip shows well-maintained joint space with no cortical irregularities around the hip or the trochanteric area of the hip.  The lower lumbar spine shows severe degenerative changes at L4-L5 and L5-S1 on the AP view.   08/27/22 - LUMBAR SPINE - COMPLETE 4+ VIEW FINDINGS: There is mild levoconvex curvature of the lumbar spine, unchanged. Alignment is anatomic. There are bilateral transpedicular screws in posterior fusion rods present at L2-L3, new from prior. Disc spacers present. Alignment is anatomic. No fractures are identified. There is moderate severe disc space narrowing at L4-L5 and L5-S1 as well as L1-L2 similar to the prior study. There are atherosclerotic calcifications of the aorta.   IMPRESSION: 1. Status post posterior fusion at L2-L3. Alignment is anatomic. 2. Multilevel degenerative disc disease, similar to prior study.  PATIENT SURVEYS:   Modified Oswestry:  MODIFIED OSWESTRY DISABILITY SCALE  Date:  05/11/2024  Pain intensity 2 =  Pain medication provides me with complete relief from pain.  2. Personal care (washing, dressing, etc.) 1 =  I can take care of myself normally, but it increases my pain.  3. Lifting 4 = I can lift only very light weights  4. Walking 1 = Pain prevents me from walking more than 1 mile.  5. Sitting 1 =  I can only sit in my favorite chair as long as I like.  6. Standing 2 =  Pain prevents me from standing more than 1 hour  7. Sleeping 2 =  Even when I take pain medication, I sleep less than 6 hours  8. Social Life 2 = Pain prevents me from participating in more energetic activities (eg. sports, dancing).  9. Traveling 2 =  My pain restricts my travel over 2 hours.  10. Employment/ Homemaking 2 = I can perform most of my homemaking/job duties, but pain prevents me from performing more physically stressful activities (eg, lifting, vacuuming).  Total 17 / 50 = 34.0 %  Interpretation Moderate disability   Interpretation of scores: Score Category Description  0-20% Minimal Disability The patient can cope with most living activities. Usually no treatment is indicated apart from advice on lifting, sitting and exercise  21-40% Moderate Disability The patient experiences more pain and difficulty with sitting, lifting and standing. Travel and social life are more difficult and they may be disabled from work. Personal care, sexual activity and sleeping are not grossly affected, and the patient can usually be managed by conservative means  41-60% Severe Disability Pain remains the main problem in this group, but activities of daily living are affected. These patients require a detailed investigation  61-80% Crippled Back pain impinges on all aspects of the patient's life. Positive intervention is required  81-100% Bed-bound These patients are either bed-bound or exaggerating their symptoms  Bluford FORBES Zoe DELENA Karon DELENA, et al. Surgery versus conservative management of stable thoracolumbar fracture: the PRESTO feasibility RCT. [place] ([place]): [employer]; 2021 Nov. [facility] Technology Assessment, No. 25.62.) Appendix 3, Oswestry Disability Index category descriptors. Available from: [employer]cz Minimally Clinically Important Difference (MCID) = 12.8%  LEFS  Extreme difficulty/unable (0), Quite a bit of difficulty (1), Moderate difficulty (2), Little difficulty (3), No difficulty (4) Survey date:  05/11/24  Any of your usual work, housework or school activities 1  2. Usual hobbies, recreational or sporting activities 2  3. Getting into/out of the bath 2  4. Walking between rooms 2  5. Putting on socks/shoes 2  6. Squatting  0  7. Lifting an object, like a bag of groceries from the floor 2  8. Performing light activities around your home 2  9. Performing heavy activities around your home 0  10. Getting into/out of a car 2  11. Walking 2 blocks 0  12. Walking 1 mile 2  13. Going up/down 10 stairs (1 flight) 2  14. Standing for 1 hour 2  15.  sitting for 1 hour 3  16. Running on even ground 0  17. Running on uneven ground 0  18. Making sharp turns while running fast 0  19. Hopping  0  20. Rolling over in bed 2  Score total:   26 / 80 = 32.5 %  Functional limitation: Moderate     SCREENING FOR RED FLAGS: Bowel or bladder incontinence: Yes: chronic bladder issues, bowel urgency - somewhat worse than before Spinal tumors: No Cauda equina syndrome: No Compression fracture: No Abdominal aneurysm: No  COGNITION:  Overall cognitive status: Within functional limits for tasks assessed    SENSATION: WFL Except B distal LE neuropathy  POSTURE:  rounded shoulders, forward head, right pelvic obliquity, and scoliosis  PALPATION: Increased muscle tension in L>R lumbar paraspinals and R>L glutes and piriformis with TTP over inferior glutes and piriformis  near hamstring origin  LUMBAR ROM: *Lumbar ROM assessed with UE support for balance and close SBA/CGA of PT  Active  Eval  Flexion Hands to distal shins  Extension 50% limited  Right lateral flexion Hand to upper thigh - p!  Left lateral flexion Hand to lateral knee  Right rotation 50% limited  Left rotation 30% limited - p!  (Blank rows = not tested)  MUSCLE LENGTH: Hamstrings: mild/mod tight B ITB: mild tight B Piriformis: mod/severe tight B Hip IR: mod tight B Hip flexors: mild tight R Quads: mild tight R>L Heelcord: mild/mod tight B  LOWER EXTREMITY ROM:    Grossly WFL other than limitations indicated above due to muscle tightness   LOWER EXTREMITY MMT:    MMT Right eval Left eval  Hip flexion 3+ 4-  Hip extension 4- 4  Hip abduction 3+ 4-  Hip adduction 3+ 4-  Hip internal rotation 4- 4-  Hip external rotation 3+ 3+  Knee flexion 4 4  Knee extension 4 4  Ankle dorsiflexion 3- 3-  Ankle plantarflexion    Ankle inversion    Ankle eversion     (Blank rows = not tested)  FUNCTIONAL TESTS: (Assessment completed 05/14/24 unless otherwise indicated) 5 times sit to stand: TBA Timed up and go (TUG): 13.75 sec with single hiking pole 10 meter walk test: 16.00  sec with single hiking pole Gait speed: 2.05 ft/sec with single hiking pole Berg Balance Scale: TBA Functional gait assessment: 10/30, < 19 = high risk fall    Standardized testing results as of discharge from latest PT episode (11/11/23): 5xSTS = 11.84 sec  = 13.63 sec w/o AD Gait speed = 2.41 ft/sec w/o AD Berg = 45/56; scores of 37-45 indicate significant (>80%) fall risk  FGA = 19/30; scores of 19-24 indicate a medium risk for falls   GAIT: Distance walked: Clinic distances Assistive device utilized: Single walking/trekking pole on R Level of assistance: SBA Gait pattern: Increased sway, step through pattern, decreased stride length, trendelenburg, lateral hip instability, poor foot clearance- Right,  and poor foot clearance- Left Comments: B foot slap   TODAY'S TREATMENT:  05/19/24 THERAPEUTIC EXERCISE: To improve ROM and flexibility.  Demonstration, verbal and tactile cues throughout for technique.  Bike L3x36min Standing runner stretch x 30 BLE Standing soleus stretch x 30 BLE Table piriformis stretch 2x30 RLE Supine L figure 4 stretch 2x30 LLE Supine glute stretch 2x30 BLE Supine KTOS stretch 2x30 BLE   Seated clams with GTB + trA x 10 BLE Seated marching GTB x + TrA 10 BLE  [facility] PT Assessment - 05/19/24 0001       Standardized Balance Assessment   Standardized Balance Assessment Berg Balance Test      Berg Balance Test   Sit to Stand Able to stand without using hands and stabilize independently    Standing Unsupported Able to stand 2 minutes with supervision    Sitting with Back Unsupported but Feet Supported on Floor or Stool Able to sit safely and securely 2 minutes    Stand to Sit Sits safely with minimal use of hands    Transfers Able to transfer safely, minor use of hands    Standing Unsupported with Eyes Closed Able to stand 10 seconds with supervision    Standing Unsupported with Feet Together Able to place feet together independently and stand 1 minute safely    From Standing, Reach Forward with Outstretched Arm Can reach confidently >25 cm (10)    From Standing Position, Pick up Object from Floor Able to pick up shoe safely and easily    From Standing Position, Turn to Look Behind Over each Shoulder Looks behind one side only/other side shows less weight shift    Turn 360 Degrees Able to turn 360 degrees safely but slowly    Standing Unsupported, Alternately Place Feet on Step/Stool Able to complete 4 steps without aid or supervision    Standing Unsupported, One Foot in Front Needs help to step but can hold 15 seconds    Standing on One Leg Unable to try or needs assist to prevent fall    Total Score 42          05/14/2024  THERAPEUTIC ACTIVITIES: To  improve functional performance.  Demonstration, verbal and tactile cues throughout for technique.  5xSTS: Deferred due to increased pain with uncontrolled descent upon sitting TUG: 13.75 sec with single hiking pole : 16.00 sec with single hiking pole Gait speed: 2.05 ft/sec with single hiking pole  PHYSICAL PERFORMANCE TEST or MEASUREMENT: Functional Gait  Assessment  Gait Level Surface Walks 20 ft, slow speed, abnormal gait pattern, evidence for imbalance or deviates 10-15 in outside of the 12 in walkway width. Requires more than 7 sec to ambulate 20 ft.   Change in Gait Speed Makes only minor adjustments to walking speed, or accomplishes a  change in speed with significant gait deviations, deviates 10-15 in outside the 12 in walkway width, or changes speed but loses balance but is able to recover and continue walking.   Gait with Horizontal Head Turns Performs head turns with moderate changes in gait velocity, slows down, deviates 10-15 in outside 12 in walkway width but recovers, can continue to walk.   Gait with Vertical Head Turns Performs task with moderate change in gait velocity, slows down, deviates 10-15 in outside 12 in walkway width but recovers, can continue to walk.   Gait and Pivot Turn Turns slowly, requires verbal cueing, or requires several small steps to catch balance following turn and stop   Step Over Obstacle Is able to step over one shoe box (4.5 in total height) without changing gait speed. No evidence of imbalance.   Gait with Narrow Base of Support Ambulates less than 4 steps heel to toe or cannot perform without assistance.   Gait with Eyes Closed Cannot walk 20 ft without assistance, severe gait deviations or imbalance, deviates greater than 15 in outside 12 in walkway width or will not attempt task.   Ambulating Backwards Walks 20 ft, slow speed, abnormal gait pattern, evidence for imbalance, deviates 10-15 in outside 12 in walkway width.   Steps Alternating feet,  must use rail.   Total Score 10   FGA comment: < 19 = high risk fall      THERAPEUTIC EXERCISE: To improve ROM and flexibility.  Demonstration, verbal and tactile cues throughout for technique.  Hooklying R HS stretch with strap hamstring 2 x 30 Supine gluteus stretch 2 x 30 bil Hooklying figure-4 piriformis stretch x 30 on L, deferred d/t increased pain on R Hooklying KTOS piriformis stretch x 30 on L, deferred d/t increased pain on R Side-sitting R modified pigeon piriformis stretch 2 x 30 - good stretch noted without increased pain Seated R KTOS piriformis stretch 2 x 30 - good stretch noted without increased pain  SELF CARE:   Modified HEP to address concerns regarding increased R buttock/hip pain with piriformis stretching, with patient reporting better tolerance for seated piriformis stretching.  Patient cautioned not to force any painful ROM/stretching and report back to PT next visit if HEP continues to irritate or cause increased pain. Provided instruction in self-STM techniques to R glutes/piriformis using tennis ball against wall or in chair.    05/04/2024  SELF CARE:  Reviewed eval findings and role of PT in addressing identified deficits as well as instruction in initial HEP (see below).    PATIENT EDUCATION:  Education details: standardized testing results and interpretation, HEP review, HEP modification - modified supine/hooklying piriformis stretches to sitting, and self-STM techniques to R glutes/piriformis using tennis ball on wall or in sitting  Person educated: Patient Education method: Explanation, Demonstration, Verbal cues, and Handouts Education comprehension: verbalized understanding, returned demonstration, and verbal cues required  HOME EXERCISE PROGRAM: Access Code: [facility] URL: [email or web address] Date: 05/19/2024 Prepared by: Kayti Poss  Exercises - Hooklying Hamstring Stretch with Strap  - 2 x daily - 7 x weekly - 3 reps - 30  sec hold - Supine Gluteus Stretch  - 2 x daily - 7 x weekly - 3 reps - 30 sec hold - Supine Figure 4 Piriformis Stretch  - 2 x daily - 7 x weekly - 3 reps - 30 sec hold - Supine Piriformis Stretch with Foot on Ground  - 2 x daily - 7 x weekly - 3 reps - 30 sec  hold - Seated Table Piriformis Stretch  - 2 x daily - 7 x weekly - 3 reps - 30 sec hold - Seated Piriformis Stretch  - 2 x daily - 7 x weekly - 3 reps - 30 sec hold - Seated March with Resistance  - 1 x daily - 7 x weekly - 2 sets - 10 reps - Seated Isometric Hip Abduction with Resistance  - 1 x daily - 7 x weekly - 2 sets - 10 reps - Glute Max Release With [employer] Against Wall  - 1 x daily - 7 x weekly - 1-2 min hold - Standing Soleus Stretch  - 1 x daily - 7 x weekly - 3 sets - 3 reps - 30 sec hold   ASSESSMENT:  CLINICAL IMPRESSION: [name] Trujillo arrived with no reports of pain today in R glute med area, however while performing BERG test in standing she noted feeling it in that area. Reviewed her stretching program, with good demonstration from patient. We added some strengthening exercises to her HEP as well in sitting with GTB.  [name] Trujillo reports R hip/buttock pain remains very localized with increased TTP, therefore provided instruction in self-STM and manual TPR techniques to using tennis ball on wall or in sitting.  She will benefit from continued skilled PT to address ongoing flexibility, strength and balance deficits to improve mobility and activity tolerance with decreased pain interference and decreased risk for falls.   EVAL: [name] Trujillo is a 83 y.o. female who was referred to physical therapy for evaluation and treatment for R hip/buttock pain presumed to be the result of lumbar radiculopathy.  Patient reports onset of R buttock pain beginning Memorial Day weekend 2025.  Pain is worse with walking and positional changes.  Patient has deficits in hip ROM, proximal LE flexibility, B LE strength, abnormal posture, and TTP with  abnormal muscle tension in R lower glutes and piriformis which are interfering with ADLs and are impacting quality of life.  On Modified Oswestry patient scored 17/50 demonstrating 34% or moderate disability.  On LEFS patient scored 26/80 demonstrating moderate functional limitation.  Given her significant LE weakness and impaired gait pattern, will plan more formalized balance testing next visit.  [name] Trujillo will benefit from skilled PT to address above deficits to improve mobility and activity tolerance with decreased pain interference.     OBJECTIVE IMPAIRMENTS: Abnormal gait, decreased activity tolerance, decreased balance, decreased knowledge of condition, decreased mobility, difficulty walking, decreased ROM, decreased strength, increased fascial restrictions, impaired perceived functional ability, increased muscle spasms, impaired flexibility, impaired sensation, improper body mechanics, postural dysfunction, and pain.   ACTIVITY LIMITATIONS: carrying, lifting, bending, standing, squatting, sleeping, stairs, transfers, bed mobility, bathing, toileting, dressing, reach over head, hygiene/grooming, locomotion level, and caring for others  PARTICIPATION LIMITATIONS: meal prep, cleaning, laundry, driving, shopping, community activity, yard work, and church  PERSONAL FACTORS: Age, Past/current experiences, Time since onset of injury/illness/exacerbation, and 3+ comorbidities: L2-3 XLIF/PSF 05/24/22; C5-7 ACDF 09/21/20; idiopathic progressive neuropathy; hypthyroidism; DDD; remote h/o back surgery at age 39; h/o scoliosis; 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   REHAB POTENTIAL: Good  CLINICAL DECISION MAKING: Unstable/unpredictable  EVALUATION COMPLEXITY: High   GOALS: Goals reviewed with patient? Yes  SHORT TERM GOALS: Target date: 06/15/2024  Patient  will be independent with initial HEP to improve outcomes and carryover.  Baseline: Initial HEP provided on eval Goal status: MET - 05/19/24  2.  Patient will report 25% improvement in low back pain to improve QOL. Baseline: 2/10 on eval, up to 10/10 Goal status: INITIAL  3.  Complete standardized balance assessment and update LTG's as indicated. Baseline:  Goal status: IN PROGRESS - 05/14/24 - , TUG and FGA completed; Berg and 5xSTS pending   LONG TERM GOALS: Target date: 07/20/2024  Patient will be independent with ongoing/advanced HEP for self-management at home.  Baseline:  Goal status: INITIAL  2.  Patient will report 50-75% improvement in R hip/pelvic pain to improve QOL.  Baseline: 2/10 on eval, up to 10/10 Goal status: INITIAL  3.  Patient will demonstrate improved B proximal LE strength to >/= 4 to 4+/5 for improved stability and ease of mobility. Baseline: Refer to above LE MMT table Goal status: INITIAL  4.  Patient will report >/= 35/80 on LEFS (MCID = 9 pts) to demonstrate improved functional ability. Baseline: 26 / 80 = 32.5 % Goal status: INITIAL   5. Patient will report </= 22% on Modified Oswestry (MCID = 12%) to demonstrate improved functional ability with decreased pain interference. Baseline: 17 / 50 = 34.0 % Goal status: INITIAL  6.  Patient will resume her normal walking routine w/o increased pain to allow for  improved mobility and activity tolerance. Baseline: R ischial pain limiting walking tolerance Goal status: INITIAL  7.  Patient will improve Berg score by at least 8 points or to >/= 45/56 (baseline as of prior D/C from PT) to improve safety and stability with ADLs in standing and reduce risk for falls.  Baseline: TBA Goal status: INITIAL  8.  Patient will improve FGA score by at least 4 points or to >/= 19/30 (baseline as of prior D/C from PT) to improve gait stability and reduce risk for falls.   Baseline: TBA Goal status: INITIAL   9.   Patient will improve gait velocity to at least 2.62 ft/sec for improved gait efficiency and safety with community ambulation.  Baseline: TBA Goal status: INITIAL   PLAN:  PT FREQUENCY: 2x/week  PT DURATION: 10 weeks  PLANNED INTERVENTIONS: 02835- PT Re-evaluation, 97750- Physical Performance Testing, 97110-Therapeutic exercises, 97530- Therapeutic activity, [id number]- Neuromuscular re-education, 97535- Self Care, 02859- Manual therapy, [phone]- Gait training, [phone]- Electrical stimulation (unattended), [id number]- Ultrasound, 02966- Ionotophoresis 4mg /ml Dexamethasone, 79439 (1-2 muscles), 20561 (3+ muscles)- Dry Needling, Patient/Family education, Balance training, Stair training, Taping, Joint mobilization, DME instructions, Cryotherapy, and Moist heat  PLAN FOR NEXT SESSION: Complete 5xSTS if able; review modified initial HEP stretches and progress to gentle lumbopelvic/proximal LE strengthening as pain allows; MT +/- TPDN to address abnormal muscle tension in glutes and piriformis   Sol LITTIE Gaskins, PTA 05/19/2024, 4:37 PM

## 2024-05-20 ENCOUNTER — Ambulatory Visit: Admitting: Orthopaedic Surgery

## 2024-05-20 ENCOUNTER — Encounter: Payer: Self-pay | Admitting: Orthopaedic Surgery

## 2024-05-20 DIAGNOSIS — M25551 Pain in right hip: Secondary | ICD-10-CM | POA: Diagnosis not present

## 2024-05-20 NOTE — Progress Notes (Signed)
 The patient is a 83 year old female that we are seeing in follow-up as a relates to her right sided ischial pain.  She just had her first physical therapy evaluation with a therapist that she knows.  At her visit with us  a month ago I did place a deep steroid injection around the ischial area on the right side and she said that did make things helped quite a bit.  She says she has not a lot of pain now with walking but mainly just sitting.  Her pain is still consistent over the right ischium and this is likely an issue with tendinitis of the hamstring region partial tearing or even bursitis.  She will continue outpatient physical therapy and now knows that she can try Voltaren gel in this area 2-3 times daily.  Will see her back in 2 months for repeat injection if needed.  She agrees with this treatment plan.

## 2024-05-24 ENCOUNTER — Telehealth (HOSPITAL_COMMUNITY): Payer: Self-pay | Admitting: *Deleted

## 2024-05-24 ENCOUNTER — Encounter (HOSPITAL_COMMUNITY): Payer: Self-pay

## 2024-05-24 NOTE — Telephone Encounter (Signed)
 Reaching out to patient to offer assistance regarding upcoming cardiac imaging study; pt verbalizes understanding of appt date/time, parking situation and where to check in, pre-test NPO status and medications ordered, and verified current allergies; name and call back number provided for further questions should they arise Johney Frame RN Navigator Cardiac Imaging [facility] [phone] office [phone] cell  Patient aware to avoid caffeine 12 hours prior to test.

## 2024-05-25 ENCOUNTER — Ambulatory Visit (HOSPITAL_COMMUNITY)
Admission: RE | Admit: 2024-05-25 | Discharge: 2024-05-25 | Disposition: A | Discharge status: Home / Self Care | Source: Ambulatory Visit | Attending: General Practice | Admitting: General Practice

## 2024-05-25 DIAGNOSIS — I2510 Atherosclerotic heart disease of native coronary artery without angina pectoris: Secondary | ICD-10-CM | POA: Diagnosis present

## 2024-05-25 DIAGNOSIS — R0609 Other forms of dyspnea: Secondary | ICD-10-CM | POA: Diagnosis present

## 2024-05-25 LAB — NM PET CT CARDIAC PERFUSION MULTI W/ABSOLUTE BLOODFLOW
MBFR: 1.89
Nuc Rest EF: 69 %
Nuc Stress EF: 71 %
Rest MBF: 1.76 ml/g/min
Rest Nuclear Isotope Dose: 14.6 mCi
ST Depression (mm): 0 mm
Stress MBF: 3.33 ml/g/min
Stress Nuclear Isotope Dose: 14.7 mCi
TID: 1.11

## 2024-05-25 MED ORDER — REGADENOSON 0.4 MG/5ML IV SOLN
0.4000 mg | Freq: Once | INTRAVENOUS | Status: AC
  Administered 2024-05-25: 0.4 mg via INTRAVENOUS

## 2024-05-25 MED ORDER — REGADENOSON 0.4 MG/5ML IV SOLN
INTRAVENOUS | Status: AC
  Filled 2024-05-25: qty 5

## 2024-05-25 MED ORDER — RUBIDIUM RB82 GENERATOR (RUBYFILL)
14.5700 | PACK | Freq: Once | INTRAVENOUS | Status: AC
  Administered 2024-05-25: 14.57 via INTRAVENOUS

## 2024-05-25 MED ORDER — RUBIDIUM RB82 GENERATOR (RUBYFILL)
14.5000 | PACK | Freq: Once | INTRAVENOUS | Status: AC
  Administered 2024-05-25: 14.73 via INTRAVENOUS

## 2024-05-25 NOTE — Progress Notes (Signed)
 Pt. Tolerated lexi scan well.

## 2024-05-26 ENCOUNTER — Encounter

## 2024-05-26 ENCOUNTER — Ambulatory Visit: Payer: Self-pay | Admitting: General Practice

## 2024-05-31 ENCOUNTER — Encounter: Admitting: Physical Therapy

## 2024-06-01 ENCOUNTER — Encounter: Payer: Self-pay | Admitting: Physical Therapy

## 2024-06-01 ENCOUNTER — Ambulatory Visit: Admitting: Physical Therapy

## 2024-06-01 DIAGNOSIS — M5416 Radiculopathy, lumbar region: Secondary | ICD-10-CM

## 2024-06-01 DIAGNOSIS — M25551 Pain in right hip: Secondary | ICD-10-CM

## 2024-06-01 DIAGNOSIS — R2689 Other abnormalities of gait and mobility: Secondary | ICD-10-CM

## 2024-06-01 DIAGNOSIS — M6281 Muscle weakness (generalized): Secondary | ICD-10-CM

## 2024-06-01 DIAGNOSIS — R2681 Unsteadiness on feet: Secondary | ICD-10-CM

## 2024-06-01 DIAGNOSIS — R252 Cramp and spasm: Secondary | ICD-10-CM

## 2024-06-01 NOTE — Therapy (Signed)
 OUTPATIENT PHYSICAL THERAPY TREATMENT   Patient Name: [name] Trujillo MRN: [id number] DOB:09-Oct-1940, 83 y.o., female Today's Date: 06/01/2024  END OF SESSION:  PT End of Session - 06/01/24 1614     Visit Number 4    Date for Recertification  07/20/24    Authorization Type [employer]    Authorization Time Period 05/11/24 - 08/09/24    Authorization - Visit Number 4    Authorization - Number of Visits 10    Progress Note Due on Visit 10    PT Start Time 1614    PT Stop Time 1701    PT Time Calculation (min) 47 min    Activity Tolerance Patient tolerated treatment well    Behavior During Therapy [name] Trujillo for tasks assessed/performed             Past Medical History:  Diagnosis Date   High cholesterol    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   She had ACUTE TAKOTSUBO (STRESS) CARDIOMYOPATHY with elevated Troponin Levels - this would be considered Demand Ischemia - Demand Infarction & NOT associated with ACS/CAD.     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  - Cath with NO CAD. ==> RESOLVED: f/u Echo 03/2022: EF 60-65%.   Past Surgical History:  Procedure Laterality Date   APPENDECTOMY     20-18 yo   BACK SURGERY     Age 71   CESAREAN SECTION     ECTOPIC PREGNANCY SURGERY     LAPAROSCOPIC HYSTERECTOMY     LEFT HEART CATH AND CORONARY ANGIOGRAPHY N/A 01/09/2022   Procedure: LEFT HEART CATH AND CORONARY ANGIOGRAPHY;  Surgeon: [name] Sharper, MD;  Location: [facility] INVASIVE CV LAB;  Service: CV:: Widely patent coronaries with mild nonobstructive LAD plaquing.  Right dominant system.  Normal LVEDP.  Based on clinical presentation, findings are consistent with acute Takotsubo  Cardiomyopathy Syndrome   POSTERIOR FUSION LUMBAR SPINE  05/24/2022   [name] Trujillo, [place]; [name] Lemmings, MD): L2-L3 XLIF, L2-L3 POSTERIOR DECOMPRESSION AND FUSION   TRANSTHORACIC ECHOCARDIOGRAM  01/08/2022   Severely decreased LV function-EF 25 to 30%.  Mid to apical (mostly anterior) with normal basal motion.  GR 2 DD-elevated LAP.  Mildly dilated LA.  Aortic sclerosis with no stenosis.  No AI.  Normal MV with mild to moderate TR.  Mildly elevated RAP, and PAP (estimated 49 mg). If LAD CAD ruled out - consistent with Takotsubo CM Syndrome.   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) 12/30/2023   New onset atrial fibrillation (HCC) 12/30/2023   Myopia of both eyes with astigmatism and presbyopia 05/06/2023   Dry eye syndrome of both eyes 05/06/2023   Mild tricuspid regurgitation 05/06/2022   Mild mitral regurgitation 05/06/2022   Hypertension 05/06/2022   History of seizure 05/06/2022   Takotsubo cardiomyopathy-resolved 01/10/2022   Hyperlipidemia with target  LDL less than 100 01/10/2022   Hypothyroid 01/10/2022   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) 01/08/2022   Numbness in both legs 04/10/2021   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   Posterior vitreous detachment of both eyes 02/05/2018   Ventricular premature beats 10/31/2015   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   Pure hypercholesterolemia 06/28/2014   Plantar fasciitis 06/28/2014    PCP: [name] Coy, MD   REFERRING PROVIDER: Vernetta Lonni GRADE, MD  REFERRING DIAG:  [phone] (ICD-10-CM) - Pain in right hip   M54.50 (ICD-10-CM) - Low back pain, unspecified  Eval and treat low back/right pelvis and right hip/ishium Any modalities per therapist   THERAPY DIAG:  Radiculopathy, lumbar region  Pain in right hip  Muscle weakness (generalized)  Cramp and spasm  Other abnormalities of gait and mobility  Unsteadiness on feet  RATIONALE FOR EVALUATION AND TREATMENT: Rehabilitation  ONSET DATE: Memorial Day weekend  NEXT MD VISIT: 07/21/2024 or 07/22/2024   SUBJECTIVE:                                                                                                                                                                                                         SUBJECTIVE STATEMENT: Pt reports her pain seems to be a bit worse today than it has been recently.  Now noting some radicular pain into LE.  EVAL: [name] Trujillo is well know to me from several prior PT episodes for foot drop, cervical and lumbar radiculopathy.  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  Woke up the next morning with severe R hip/buttock pain.  Pain mostly localized to sciatic and ischial area of her R buttock.  PCP provider told her it was likely hip bursitis but ortho MD feels like it is more likely radicular from her back.  Some relief from steroid injection around her right hip ischial area provided by ortho MD but pain has never fully gone away.   She does have a significant lurch in her gait which is somewhat more pronounced than on prior PT episodes with this PT.  PAIN: Are you having pain? Yes: NPRS scale: 2-3/10 currently, up to 4/10 this morning  Pain location: R lower buttock most of the time, with some shooting pain down posterolateral leg to calf with numbness in knee area Pain description: constant   Aggravating factors: putting more weight on R LE  Relieving factors: Theraworx cream   PERTINENT HISTORY:  L2-3 XLIF/PSF  05/24/22; C5-7 ACDF 09/21/20; idiopathic progressive neuropathy; hypthyroidism; DDD; remote h/o back surgery at age 73; h/o scoliosis; Takotsubo cardiomyopathy 01/10/22; CAD; paroxysmal afib; mitral and tricuspid regurgitation; myalgia due to statin; recent cataract surgery  PRECAUTIONS: Fall  RED FLAGS: Bowel or bladder incontinence: Yes: chronic bladder issues, bowel urgency - somewhat worse than before   WEIGHT BEARING RESTRICTIONS: No  FALLS:  Has patient fallen in last 6 months? No  LIVING ENVIRONMENT: Lives with: lives with their spouse Lives in: House/apartment Stairs: Yes: External: 5-6 steps; on left going up Has following equipment at home: Walker - 2 wheeled, shower chair, and hiking/walking poles  OCCUPATION: Retired Runner, broadcasting/film/video  PLOF: Independent and Leisure: walking ~30 minutes 1x/day (3x around the court near where she lives); sewing/quilting; likes working in the yard    PATIENT GOALS: To start walking again w/o doing more damage.   OBJECTIVE: (objective measures completed at initial evaluation unless otherwise dated)  DIAGNOSTIC FINDINGS:  04/21/24 - XR RIGHT HIP: An AP pelvis and lateral of the right hip shows well-maintained joint space with no cortical irregularities around the hip or the trochanteric area of the hip.  The lower lumbar spine shows severe degenerative changes at L4-L5 and L5-S1 on the AP view.   08/27/22 - LUMBAR SPINE - COMPLETE 4+ VIEW FINDINGS: There is mild levoconvex curvature of the lumbar spine, unchanged. Alignment is anatomic. There are bilateral transpedicular screws in posterior fusion rods present at L2-L3, new from prior. Disc spacers present. Alignment is anatomic. No fractures are identified. There is moderate severe disc space narrowing at L4-L5 and L5-S1 as well as L1-L2 similar to the prior study. There are atherosclerotic calcifications of the aorta.   IMPRESSION: 1. Status post posterior fusion at L2-L3. Alignment is anatomic. 2.  Multilevel degenerative disc disease, similar to prior study.  PATIENT SURVEYS:  Modified Oswestry:  MODIFIED OSWESTRY DISABILITY SCALE  Date:  05/11/2024  Pain intensity 2 =  Pain medication provides me with complete relief from pain.  2. Personal care (washing, dressing, etc.) 1 =  I can take care of myself normally, but it increases my pain.  3. Lifting 4 = I can lift only very light weights  4. Walking 1 = Pain prevents me from walking more than 1 mile.  5. Sitting 1 =  I can only sit in my favorite chair as long as I like.  6. Standing 2 =  Pain prevents me from standing more than 1 hour  7. Sleeping 2 =  Even when I take pain medication, I sleep less than 6 hours  8. Social Life 2 = Pain prevents me from participating in more energetic activities (eg. sports, dancing).  9. Traveling 2 =  My pain restricts my travel over 2 hours.  10. Employment/ Homemaking 2 = I can perform most of my homemaking/job duties, but pain prevents me from performing more physically stressful activities (eg, lifting, vacuuming).  Total 17 / 50 = 34.0 %  Interpretation Moderate disability   Interpretation of scores: Score Category Description  0-20% Minimal Disability The patient can cope with most living activities. Usually no treatment is indicated apart from advice on lifting, sitting and exercise  21-40% Moderate Disability The patient experiences more pain and difficulty with sitting, lifting and standing. Travel and social life are more difficult and they may be disabled from work. Personal care, sexual activity and sleeping are not grossly affected, and the patient can usually be managed by conservative means  41-60% Severe Disability  Pain remains the main problem in this [place], but activities of daily living are affected. These patients require a detailed investigation  61-80% Crippled Back pain impinges on all aspects of the patient's life. Positive intervention is required  81-100% Bed-bound These  patients are either bed-bound or exaggerating their symptoms  Bluford FORBES Zoe DELENA Karon DELENA, et al. Surgery versus conservative management of stable thoracolumbar fracture: the PRESTO feasibility RCT. [place]): [employer]; 2021 Nov. [facility] Technology Assessment, No. 25.62.) Appendix 3, Oswestry Disability Index category descriptors. Available from: [employer]el[employer].cz Minimally Clinically Important Difference (MCID) = 12.8%  LEFS  Extreme difficulty/unable (0), Quite a bit of difficulty (1), Moderate difficulty (2), Little difficulty (3), No difficulty (4) Survey date:  05/11/24  Any of your usual work, housework or school activities 1  2. Usual hobbies, recreational or sporting activities 2  3. Getting into/out of the bath 2  4. Walking between rooms 2  5. Putting on socks/shoes 2  6. Squatting  0  7. Lifting an object, like a bag of groceries from the floor 2  8. Performing light activities around your home 2  9. Performing heavy activities around your home 0  10. Getting into/out of a car 2  11. Walking 2 blocks 0  12. Walking 1 mile 2  13. Going up/down 10 stairs (1 flight) 2  14. Standing for 1 hour 2  15.  sitting for 1 hour 3  16. Running on even ground 0  17. Running on uneven ground 0  18. Making sharp turns while running fast 0  19. Hopping  0  20. Rolling over in bed 2  Score total:   26 / 80 = 32.5 %  Functional limitation: Moderate     SCREENING FOR RED FLAGS: Bowel or bladder incontinence: Yes: chronic bladder issues, bowel urgency - somewhat worse than before Spinal tumors: No Cauda equina syndrome: No Compression fracture: No Abdominal aneurysm: No  COGNITION:  Overall cognitive status: Within functional limits for tasks assessed    SENSATION: WFL Except B distal LE neuropathy  POSTURE:  rounded shoulders, forward head, right pelvic obliquity, and scoliosis  PALPATION: Increased muscle tension in L>R lumbar  paraspinals and R>L glutes and piriformis with TTP over inferior glutes and piriformis near hamstring origin  LUMBAR ROM: *Lumbar ROM assessed with UE support for balance and close SBA/CGA of PT  Active  Eval  Flexion Hands to distal shins  Extension 50% limited  Right lateral flexion Hand to upper thigh - p!  Left lateral flexion Hand to lateral knee  Right rotation 50% limited  Left rotation 30% limited - p!  (Blank rows = not tested)  MUSCLE LENGTH: Hamstrings: mild/mod tight B ITB: mild tight B Piriformis: mod/severe tight B Hip IR: mod tight B Hip flexors: mild tight R Quads: mild tight R>L Heelcord: mild/mod tight B  LOWER EXTREMITY ROM:    Grossly WFL other than limitations indicated above due to muscle tightness   LOWER EXTREMITY MMT:    MMT Right eval Left eval  Hip flexion 3+ 4-  Hip extension 4- 4  Hip abduction 3+ 4-  Hip adduction 3+ 4-  Hip internal rotation 4- 4-  Hip external rotation 3+ 3+  Knee flexion 4 4  Knee extension 4 4  Ankle dorsiflexion 3- 3-  Ankle plantarflexion    Ankle inversion    Ankle eversion     (Blank rows = not tested)  FUNCTIONAL TESTS: (Assessment completed 05/14/24 unless otherwise indicated)  5 times sit to stand: 12.37 sec w/o UE assist - 06/01/24 Timed up and go (TUG): 13.75 sec with single hiking pole 10 meter walk test: 16.00 sec with single hiking pole Gait speed: 2.05 ft/sec with single hiking pole Berg Balance Scale: 42/56; 37-45 significant (>80%) - 05/19/24 Functional gait assessment: 10/30, < 19 = high risk fall    Standardized testing results as of discharge from latest PT episode (11/11/23): 5xSTS = 11.84 sec  = 13.63 sec w/o AD Gait speed = 2.41 ft/sec w/o AD Berg = 45/56; scores of 37-45 indicate significant (>80%) fall risk  FGA = 19/30; scores of 19-24 indicate a medium risk for falls   GAIT: Distance walked: Clinic distances Assistive device utilized: Single walking/trekking pole on R Level of  assistance: SBA Gait pattern: Increased sway, step through pattern, decreased stride length, trendelenburg, lateral hip instability, poor foot clearance- Right, and poor foot clearance- Left Comments: B foot slap   TODAY'S TREATMENT:   06/01/2024 THERAPEUTIC EXERCISE: To improve strength and endurance.  Demonstration, verbal and tactile cues throughout for technique. NuStep - L4 x 6 min (LE only)  THERAPEUTIC ACTIVITIES: To improve functional performance.  Demonstration, verbal and tactile cues throughout for technique. 5xSTS = 12.37 sec w/o UE assist, somewhat uncontrolled descent esp on last rep  TRIGGER POINT DRY NEEDLING: Treatment instructions/education: Initial Treatment: Pt instructed on Dry Needling rational, procedures, and possible side effects. Pt instructed to expect mild to moderate muscle soreness later in the day and/or into the next day.  Pt instructed in methods to reduce muscle soreness. Pt instructed to continue prescribed HEP. Patient was educated on signs and symptoms of infection and other risk factors and advised to seek medical attention should they occur.  Patient verbalized understanding of these instructions and education.  Education Handout Provided: Yes Consent: Patient Verbal Consent Given: Yes Treatment: Muscles Treated: R medial and lateral piriformis, R glute medius Utilized skilled palpation to identify bony landmarks and trigger points along with monitoring of soft tissue during DN. Electrical Stimulation Performed: No Treatment Response/Outcome: Twitch response elicited, Palpable increase in muscle length, Decreased tissue resistance noted, Decreased pain/TTP, and Improved exercise tolerance  MANUAL THERAPY: To promote normalized muscle tension, improved flexibility, and reduced pain. STM/DTM, manual TPR and pin & stretch to muscles addressed with DN   NEUROMUSCULAR RE-EDUCATION: To improve balance, coordination, kinesthesia, posture,  proprioception, and amplitude of movement. Mini-squat with counter support x 10   05/19/24 THERAPEUTIC EXERCISE: To improve ROM and flexibility.  Demonstration, verbal and tactile cues throughout for technique.  Bike L3x57min Standing runner stretch x 30 BLE Standing soleus stretch x 30 BLE Table piriformis stretch 2x30 RLE Supine L figure 4 stretch 2x30 LLE Supine glute stretch 2x30 BLE Supine KTOS stretch 2x30 BLE  Seated clams with GTB + trA x 10 BLE Seated marching GTB x + TrA 10 BLE Berg Balance Test  Sit to Stand Able to stand without using hands and stabilize independently   Standing Unsupported Able to stand 2 minutes with supervision   Sitting with Back Unsupported but Feet Supported on Floor or Stool Able to sit safely and securely 2 minutes   Stand to Sit Sits safely with minimal use of hands   Transfers Able to transfer safely, minor use of hands   Standing Unsupported with Eyes Closed Able to stand 10 seconds with supervision   Standing Unsupported with Feet Together Able to place feet together independently and stand 1 minute safely   From Standing,  Reach Forward with Outstretched Arm Can reach confidently >25 cm (10)   From Standing Position, Pick up Object from Floor Able to pick up shoe safely and easily   From Standing Position, Turn to Look Behind Over each Shoulder Looks behind one side only/other side shows less weight shift   Turn 360 Degrees Able to turn 360 degrees safely but slowly   Standing Unsupported, Alternately Place Feet on Step/Stool Able to complete 4 steps without aid or supervision   Standing Unsupported, One Foot in Front Needs help to step but can hold 15 seconds   Standing on One Leg Unable to try or needs assist to prevent fall   Total Score 42       05/14/2024  THERAPEUTIC ACTIVITIES: To improve functional performance.  Demonstration, verbal and tactile cues throughout for technique.  5xSTS: Deferred due to increased pain with  uncontrolled descent upon sitting TUG: 13.75 sec with single hiking pole : 16.00 sec with single hiking pole Gait speed: 2.05 ft/sec with single hiking pole  PHYSICAL PERFORMANCE TEST or MEASUREMENT: Functional Gait  Assessment  Gait Level Surface Walks 20 ft, slow speed, abnormal gait pattern, evidence for imbalance or deviates 10-15 in outside of the 12 in walkway width. Requires more than 7 sec to ambulate 20 ft.   Change in Gait Speed Makes only minor adjustments to walking speed, or accomplishes a change in speed with significant gait deviations, deviates 10-15 in outside the 12 in walkway width, or changes speed but loses balance but is able to recover and continue walking.   Gait with Horizontal Head Turns Performs head turns with moderate changes in gait velocity, slows down, deviates 10-15 in outside 12 in walkway width but recovers, can continue to walk.   Gait with Vertical Head Turns Performs task with moderate change in gait velocity, slows down, deviates 10-15 in outside 12 in walkway width but recovers, can continue to walk.   Gait and Pivot Turn Turns slowly, requires verbal cueing, or requires several small steps to catch balance following turn and stop   Step Over Obstacle Is able to step over one shoe box (4.5 in total height) without changing gait speed. No evidence of imbalance.   Gait with Narrow Base of Support Ambulates less than 4 steps heel to toe or cannot perform without assistance.   Gait with Eyes Closed Cannot walk 20 ft without assistance, severe gait deviations or imbalance, deviates greater than 15 in outside 12 in walkway width or will not attempt task.   Ambulating Backwards Walks 20 ft, slow speed, abnormal gait pattern, evidence for imbalance, deviates 10-15 in outside 12 in walkway width.   Steps Alternating feet, must use rail.   Total Score 10   FGA comment: < 19 = high risk fall      THERAPEUTIC EXERCISE: To improve ROM and flexibility.   Demonstration, verbal and tactile cues throughout for technique.  Hooklying R HS stretch with strap hamstring 2 x 30 Supine gluteus stretch 2 x 30 bil Hooklying figure-4 piriformis stretch x 30 on L, deferred d/t increased pain on R Hooklying KTOS piriformis stretch x 30 on L, deferred d/t increased pain on R Side-sitting R modified pigeon piriformis stretch 2 x 30 - good stretch noted without increased pain Seated R KTOS piriformis stretch 2 x 30 - good stretch noted without increased pain  SELF CARE:   Modified HEP to address concerns regarding increased R buttock/hip pain with piriformis stretching, with patient reporting better tolerance  for seated piriformis stretching.  Patient cautioned not to force any painful ROM/stretching and report back to PT next visit if HEP continues to irritate or cause increased pain. Provided instruction in self-STM techniques to R glutes/piriformis using tennis ball against wall or in chair.    05/04/2024  SELF CARE:  Reviewed eval findings and role of PT in addressing identified deficits as well as instruction in initial HEP (see below).    PATIENT EDUCATION:  Education details: standardized testing results and interpretation, HEP progression - counter supported mini squats, role of TPDN, TPDN rational, procedure, outcomes, potential side effects, and recommended post-treatment exercises/activity, and fee schedule for TPDN and lack of insurance coverage requiring payment at time of service  Person educated: Patient Education method: Explanation, Demonstration, Verbal cues, and Handouts Education comprehension: verbalized understanding, returned demonstration, verbal cues required, and needs further education  HOME EXERCISE PROGRAM: Access Code: [facility] URL: [email or web address] Date: 06/01/2024 Prepared by: Elijah Hidden  Exercises - Standing Soleus Stretch  - 1 x daily - 7 x weekly - 3 sets - 3 reps - 30 sec hold - Hooklying  Hamstring Stretch with Strap  - 2 x daily - 7 x weekly - 3 reps - 30 sec hold - Supine Gluteus Stretch  - 2 x daily - 7 x weekly - 3 reps - 30 sec hold - Supine Figure 4 Piriformis Stretch  - 2 x daily - 7 x weekly - 3 reps - 30 sec hold - Supine Piriformis Stretch with Foot on Ground  - 2 x daily - 7 x weekly - 3 reps - 30 sec hold - Seated Table Piriformis Stretch  - 2 x daily - 7 x weekly - 3 reps - 30 sec hold - Seated Piriformis Stretch  - 2 x daily - 7 x weekly - 3 reps - 30 sec hold - Seated March with Resistance  - 1 x daily - 7 x weekly - 2 sets - 10 reps - Seated Isometric Hip Abduction with Resistance  - 1 x daily - 7 x weekly - 2 sets - 10 reps - Glute Max Release With [employer] Against Wall  - 1 x daily - 7 x weekly - 1-2 min hold - Mini Squat with Counter Support  - 1 x daily - 7 x weekly - 2 sets - 10 reps - 3-5 sec hold   ASSESSMENT:  CLINICAL IMPRESSION: 5xSTS assessment completed with current time not significantly different than previous DC from PT however decreased eccentric control noted.  Myrel reports some return of her R glute pain recently and now notes some radicular pain and tingling down R posterior lateral thigh to upper calf area.  She reports she has been utilizing the self-STM techniques and typically notes relief within a short period following self-STM, however continued localized tenderness and increased muscle tension evident in R glutes and piriformis which appeared amenable to TPDN.  After review/explanation of TPDN rational, procedures, outcomes and potential side effects, patient verbalized consent to TPDN treatment in conjunction with manual STM/DTM and TPR to reduce ttp/muscle tension.  Muscles treated as indicated above. TPDN produced normal response with good twitches elicited resulting in palpable reduction in pain/ttp and muscle tension. Pt educated to expect mild to moderate muscle soreness for up to 24-48 hrs and instructed to continue prescribed  HEP and current activity level with pt verbalizing understanding of these instructions.  She denied need for review of HEP exercises and reported good tolerance for progression of exercises  provided last visit therefore worked on eccentric glute control with counter supported mini squats.  Laportia will benefit from continued skilled PT to address ongoing flexibility, strength and balance deficits to improve mobility and activity tolerance with decreased pain interference and decreased risk for falls.   EVAL: Sarahy Imran is a 83 y.o. female who was referred to physical therapy for evaluation and treatment for R hip/buttock pain presumed to be the result of lumbar radiculopathy.  Patient reports onset of R buttock pain beginning Memorial Day weekend 2025.  Pain is worse with walking and positional changes.  Patient has deficits in hip ROM, proximal LE flexibility, B LE strength, abnormal posture, and TTP with abnormal muscle tension in R lower glutes and piriformis which are interfering with ADLs and are impacting quality of life.  On Modified Oswestry patient scored 17/50 demonstrating 34% or moderate disability.  On LEFS patient scored 26/80 demonstrating moderate functional limitation.  Given her significant LE weakness and impaired gait pattern, will plan more formalized balance testing next visit.  Leonora will benefit from skilled PT to address above deficits to improve mobility and activity tolerance with decreased pain interference.     OBJECTIVE IMPAIRMENTS: Abnormal gait, decreased activity tolerance, decreased balance, decreased knowledge of condition, decreased mobility, difficulty walking, decreased ROM, decreased strength, increased fascial restrictions, impaired perceived functional ability, increased muscle spasms, impaired flexibility, impaired sensation, improper body mechanics, postural dysfunction, and pain.   ACTIVITY LIMITATIONS: carrying, lifting, bending, standing, squatting, sleeping,  stairs, transfers, bed mobility, bathing, toileting, dressing, reach over head, hygiene/grooming, locomotion level, and caring for others  PARTICIPATION LIMITATIONS: meal prep, cleaning, laundry, driving, shopping, community activity, yard work, and church  PERSONAL FACTORS: Age, Past/current experiences, Time since onset of injury/illness/exacerbation, and 3+ comorbidities: L2-3 XLIF/PSF 05/24/22; C5-7 ACDF 09/21/20; idiopathic progressive neuropathy; hypthyroidism; DDD; remote h/o back surgery at age 77; h/o scoliosis; 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   REHAB POTENTIAL: Good  CLINICAL DECISION MAKING: Unstable/unpredictable  EVALUATION COMPLEXITY: High   GOALS: Goals reviewed with patient? Yes  SHORT TERM GOALS: Target date: 06/15/2024  Patient will be independent with initial HEP to improve outcomes and carryover.  Baseline: Initial HEP provided on eval Goal status: MET - 05/19/24  2.  Patient will report 25% improvement in low back pain to improve QOL. Baseline: 2/10 on eval, up to 10/10 Goal status: INITIAL  3.  Complete standardized balance assessment and update LTG's as indicated. Baseline:  05/14/24 - , TUG and FGA completed; Berg and 5xSTS pending  05/19/24 GLENWOOD Levins completed Goal status: MET - 06/01/24 - 5xSTS completed  LONG TERM GOALS: Target date: 07/20/2024  Patient will be independent with ongoing/advanced HEP for self-management at home.  Baseline:  Goal status: INITIAL  2.  Patient will report 50-75% improvement in R hip/pelvic pain to improve QOL.  Baseline: 2/10 on eval, up to 10/10 Goal status: INITIAL  3.  Patient will demonstrate improved B proximal LE strength to >/= 4 to 4+/5 for improved stability and ease of mobility. Baseline: Refer to above LE MMT table Goal status: INITIAL  4.  Patient will  report >/= 35/80 on LEFS (MCID = 9 pts) to demonstrate improved functional ability. Baseline: 26 / 80 = 32.5 % Goal status: INITIAL   5. Patient will report </= 22% on Modified Oswestry (MCID = 12%) to demonstrate improved functional ability with decreased pain interference. Baseline: 17 /  50 = 34.0 % Goal status: INITIAL  6.  Patient will resume her normal walking routine w/o increased pain to allow for  improved mobility and activity tolerance. Baseline: R ischial pain limiting walking tolerance Goal status: INITIAL  7.  Patient will improve Berg score by at least 8 points or to >/= 45/56 (baseline as of prior D/C from PT) to improve safety and stability with ADLs in standing and reduce risk for falls.  Baseline: TBA Goal status: INITIAL  8.  Patient will improve FGA score by at least 4 points or to >/= 19/30 (baseline as of prior D/C from PT) to improve gait stability and reduce risk for falls.   Baseline: TBA Goal status: INITIAL   9.  Patient will improve gait velocity to at least 2.62 ft/sec for improved gait efficiency and safety with community ambulation.  Baseline: TBA Goal status: INITIAL   PLAN:  PT FREQUENCY: 2x/week  PT DURATION: 10 weeks  PLANNED INTERVENTIONS: 97164- PT Re-evaluation, 97750- Physical Performance Testing, 97110-Therapeutic exercises, 97530- Therapeutic activity, [id number]- Neuromuscular re-education, 97535- Self Care, 02859- Manual therapy, [phone]- Gait training, [phone]- Electrical stimulation (unattended), [phone]- Ultrasound, 02966- Ionotophoresis 4mg /ml Dexamethasone, 79439 (1-2 muscles), 20561 (3+ muscles)- Dry Needling, Patient/Family education, Balance training, Stair training, Taping, Joint mobilization, DME instructions, Cryotherapy, and Moist heat  PLAN FOR NEXT SESSION: Assess response to TPDN; review modified initial HEP stretches and progress to gentle lumbopelvic/proximal LE strengthening as pain allows; MT +/- TPDN to address abnormal muscle  tension in glutes and piriformis   Elijah CHRISTELLA Hidden, PT 06/01/2024, 5:26 PM

## 2024-06-02 ENCOUNTER — Encounter: Admitting: Physical Therapy

## 2024-06-03 ENCOUNTER — Encounter: Payer: Self-pay | Admitting: Physical Therapy

## 2024-06-03 ENCOUNTER — Ambulatory Visit: Admitting: Physical Therapy

## 2024-06-03 DIAGNOSIS — R252 Cramp and spasm: Secondary | ICD-10-CM

## 2024-06-03 DIAGNOSIS — M25551 Pain in right hip: Secondary | ICD-10-CM

## 2024-06-03 DIAGNOSIS — R2689 Other abnormalities of gait and mobility: Secondary | ICD-10-CM

## 2024-06-03 DIAGNOSIS — M5416 Radiculopathy, lumbar region: Secondary | ICD-10-CM | POA: Diagnosis not present

## 2024-06-03 DIAGNOSIS — M6281 Muscle weakness (generalized): Secondary | ICD-10-CM

## 2024-06-03 DIAGNOSIS — R2681 Unsteadiness on feet: Secondary | ICD-10-CM

## 2024-06-03 NOTE — Therapy (Signed)
 OUTPATIENT PHYSICAL THERAPY TREATMENT   Patient Name: [name] Trujillo MRN: [id number] DOB:August 13, 1941, 83 y.o., female Today's Date: 06/03/2024  END OF SESSION:  PT End of Session - 06/03/24 1530     Visit Number 5    Date for Recertification  07/20/24    Authorization Type [employer]    Authorization Time Period 05/11/24 - 08/09/24    Authorization - Visit Number 5    Authorization - Number of Visits 10    Progress Note Due on Visit 10    PT Start Time 1530    PT Stop Time 1614    PT Time Calculation (min) 44 min    Activity Tolerance Patient tolerated treatment well    Behavior During Therapy [facility] for tasks assessed/performed              Past Medical History:  Diagnosis Date   High cholesterol    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   She had ACUTE TAKOTSUBO (STRESS) CARDIOMYOPATHY with elevated Troponin Levels - this would be considered Demand Ischemia - Demand Infarction & NOT associated with ACS/CAD.     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  - Cath with NO CAD. ==> RESOLVED: f/u Echo 03/2022: EF 60-65%.   Past Surgical History:  Procedure Laterality Date   APPEN[age]CTOMY     27-18 yo   BACK SURGERY     Age 28   CESAREAN SECTION     ECTOPIC PREGNANCY SURGERY     LAPAROSCOPIC HYSTERECTOMY     LEFT HEART CATH AND CORONARY ANGIOGRAPHY N/A 01/09/2022   Procedure: LEFT HEART CATH AND CORONARY ANGIOGRAPHY;  Surgeon: Wonda Sharper, MD;  Location: [facility] INVASIVE CV LAB;  Service: CV:: Widely patent coronaries with mild nonobstructive LAD plaquing.  Right dominant system.  Normal LVEDP.  Based on clinical presentation, findings are consistent with acute Takotsubo  Cardiomyopathy Syndrome   POSTERIOR FUSION LUMBAR SPINE  05/24/2022   [facility], [place]; [name], MD): L2-L3 XLIF, L2-L3 POSTERIOR DECOMPRESSION AND FUSION   TRANSTHORACIC ECHOCARDIOGRAM  01/08/2022   Severely decreased LV function-EF 25 to 30%.  Mid to apical (mostly anterior) with normal basal motion.  GR 2 DD-elevated LAP.  Mildly dilated LA.  Aortic sclerosis with no stenosis.  No AI.  Normal MV with mild to moderate TR.  Mildly elevated RAP, and PAP (estimated 49 mg). If LAD CAD ruled out - consistent with Takotsubo CM Syndrome.   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) 12/30/2023   New onset atrial fibrillation (HCC) 12/30/2023   Myopia of both eyes with astigmatism and presbyopia 05/06/2023   Dry eye syndrome of both eyes 05/06/2023   Mild tricuspid regurgitation 05/06/2022   Mild mitral regurgitation 05/06/2022   Hypertension 05/06/2022   History of seizure 05/06/2022   Takotsubo cardiomyopathy-resolved 01/10/2022   Hyperlipidemia with  target LDL less than 100 01/10/2022   Hypothyroid 01/10/2022   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) 01/08/2022   Numbness in both legs 04/10/2021   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   Posterior vitreous detachment of both eyes 02/05/2018   Ventricular premature beats 10/31/2015   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   Pure hypercholesterolemia 06/28/2014   Plantar fasciitis 06/28/2014    PCP: Windy Coy, MD   REFERRING PROVIDER: Vernetta Lonni GRADE, MD  REFERRING DIAG:  [phone] (ICD-10-CM) - Pain in right hip   M54.50 (ICD-10-CM) - Low back pain, unspecified  Eval and treat low back/right pelvis and right hip/ishium Any modalities per therapist   THERAPY DIAG:  Radiculopathy, lumbar region  Pain in right hip  Muscle weakness (generalized)  Cramp and spasm  Other abnormalities of gait and mobility  Unsteadiness on feet  RATIONALE FOR EVALUATION AND TREATMENT: Rehabilitation  ONSET DATE: Memorial Day weekend  NEXT MD VISIT: 07/21/2024 or 07/22/2024   SUBJECTIVE:                                                                                                                                                                                                         SUBJECTIVE STATEMENT: Pt reports no increased pain since the TPDN but does not necessarily feel any relief.  Pain now following a path from buttock along lateral leg (consistent with ITB).    EVAL: Maury is well know to me from several prior PT episodes for foot drop, cervical and lumbar radiculopathy.  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  Woke up the next morning with severe R hip/buttock pain.  Pain mostly localized to sciatic and ischial area of her R buttock.  PCP provider told her it was likely hip bursitis but ortho MD feels like it is more likely radicular from her back.  Some relief from steroid injection around her right hip ischial area provided by ortho MD but pain has never fully gone away.   She does have a significant lurch in her gait which is somewhat more pronounced than on prior PT episodes with this PT.  PAIN: Are you having pain? Yes: NPRS scale: 2-3/10 currently, up to 4/10 this morning  Pain location: R lower buttock, with radicular pain down lateral leg (ITB distrubution)  Pain description: constant   Aggravating factors: putting more weight on R LE  Relieving factors: Theraworx cream   PERTINENT HISTORY:  L2-3  XLIF/PSF  05/24/22; C5-7 ACDF 09/21/20; idiopathic progressive neuropathy; hypthyroidism; DDD; remote h/o back surgery at age 59; h/o scoliosis; Takotsubo cardiomyopathy 01/10/22; CAD; paroxysmal afib; mitral and tricuspid regurgitation; myalgia due to statin; recent cataract surgery  PRECAUTIONS: Fall  RED FLAGS: Bowel or bladder incontinence: Yes: chronic bladder issues, bowel urgency - somewhat worse than before   WEIGHT BEARING RESTRICTIONS: No  FALLS:  Has patient fallen in last 6 months? No  LIVING ENVIRONMENT: Lives with: lives with their spouse Lives in: House/apartment Stairs: Yes: External: 5-6 steps; on left going up Has following equipment at home: Walker - 2 wheeled, shower chair, and hiking/walking poles  OCCUPATION: Retired Runner, broadcasting/film/video  PLOF: Independent and Leisure: walking ~30 minutes 1x/day (3x around the court near where she lives); sewing/quilting; likes working in the yard    PATIENT GOALS: To start walking again w/o doing more damage.   OBJECTIVE: (objective measures completed at initial evaluation unless otherwise dated)  DIAGNOSTIC FINDINGS:  04/21/24 - XR RIGHT HIP: An AP pelvis and lateral of the right hip shows well-maintained joint space with no cortical irregularities around the hip or the trochanteric area of the hip.  The lower lumbar spine shows severe degenerative changes at L4-L5 and L5-S1 on the AP view.   08/27/22 - LUMBAR SPINE - COMPLETE 4+ VIEW FINDINGS: There is mild levoconvex curvature of the lumbar spine, unchanged. Alignment is anatomic. There are bilateral transpedicular screws in posterior fusion rods present at L2-L3, new from prior. Disc spacers present. Alignment is anatomic. No fractures are identified. There is moderate severe disc space narrowing at L4-L5 and L5-S1 as well as L1-L2 similar to the prior study. There are atherosclerotic calcifications of the aorta.   IMPRESSION: 1. Status post posterior fusion at L2-L3. Alignment is  anatomic. 2. Multilevel degenerative disc disease, similar to prior study.  PATIENT SURVEYS:  Modified Oswestry:  MODIFIED OSWESTRY DISABILITY SCALE  Date:  05/11/2024  Pain intensity 2 =  Pain medication provides me with complete relief from pain.  2. Personal care (washing, dressing, etc.) 1 =  I can take care of myself normally, but it increases my pain.  3. Lifting 4 = I can lift only very light weights  4. Walking 1 = Pain prevents me from walking more than 1 mile.  5. Sitting 1 =  I can only sit in my favorite chair as long as I like.  6. Standing 2 =  Pain prevents me from standing more than 1 hour  7. Sleeping 2 =  Even when I take pain medication, I sleep less than 6 hours  8. Social Life 2 = Pain prevents me from participating in more energetic activities (eg. sports, dancing).  9. Traveling 2 =  My pain restricts my travel over 2 hours.  10. Employment/ Homemaking 2 = I can perform most of my homemaking/job duties, but pain prevents me from performing more physically stressful activities (eg, lifting, vacuuming).  Total 17 / 50 = 34.0 %  Interpretation Moderate disability   Interpretation of scores: Score Category Description  0-20% Minimal Disability The patient can cope with most living activities. Usually no treatment is indicated apart from advice on lifting, sitting and exercise  21-40% Moderate Disability The patient experiences more pain and difficulty with sitting, lifting and standing. Travel and social life are more difficult and they may be disabled from work. Personal care, sexual activity and sleeping are not grossly affected, and the patient can usually be managed by conservative means  41-60% Severe Disability  Pain remains the main problem in this group, but activities of daily living are affected. These patients require a detailed investigation  61-80% Crippled Back pain impinges on all aspects of the patient's life. Positive intervention is required  81-100%  Bed-bound These patients are either bed-bound or exaggerating their symptoms  Bluford FORBES Zoe DELENA Karon DELENA, et al. Surgery versus conservative management of stable thoracolumbar fracture: the PRESTO feasibility RCT. [place] ([place]): [employer]; 2021 Nov. [facility] Technology Assessment, No. 25.62.) Appendix 3, Oswestry Disability Index category descriptors. Available from: [employer]cz Minimally Clinically Important Difference (MCID) = 12.8%  LEFS  Extreme difficulty/unable (0), Quite a bit of difficulty (1), Moderate difficulty (2), Little difficulty (3), No difficulty (4) Survey date:  05/11/24  Any of your usual work, housework or school activities 1  2. Usual hobbies, recreational or sporting activities 2  3. Getting into/out of the bath 2  4. Walking between rooms 2  5. Putting on socks/shoes 2  6. Squatting  0  7. Lifting an object, like a bag of groceries from the floor 2  8. Performing light activities around your home 2  9. Performing heavy activities around your home 0  10. Getting into/out of a car 2  11. Walking 2 blocks 0  12. Walking 1 mile 2  13. Going up/down 10 stairs (1 flight) 2  14. Standing for 1 hour 2  15.  sitting for 1 hour 3  16. Running on even ground 0  17. Running on uneven ground 0  18. Making sharp turns while running fast 0  19. Hopping  0  20. Rolling over in bed 2  Score total:   26 / 80 = 32.5 %  Functional limitation: Moderate     SCREENING FOR RED FLAGS: Bowel or bladder incontinence: Yes: chronic bladder issues, bowel urgency - somewhat worse than before Spinal tumors: No Cauda equina syndrome: No Compression fracture: No Abdominal aneurysm: No  COGNITION:  Overall cognitive status: Within functional limits for tasks assessed    SENSATION: WFL Except B distal LE neuropathy  POSTURE:  rounded shoulders, forward head, right pelvic obliquity, and scoliosis  PALPATION: Increased muscle  tension in L>R lumbar paraspinals and R>L glutes and piriformis with TTP over inferior glutes and piriformis near hamstring origin  LUMBAR ROM: *Lumbar ROM assessed with UE support for balance and close SBA/CGA of PT  Active  Eval  Flexion Hands to distal shins  Extension 50% limited  Right lateral flexion Hand to upper thigh - p!  Left lateral flexion Hand to lateral knee  Right rotation 50% limited  Left rotation 30% limited - p!  (Blank rows = not tested)  MUSCLE LENGTH: Hamstrings: mild/mod tight B ITB: mild tight B Piriformis: mod/severe tight B Hip IR: mod tight B Hip flexors: mild tight R Quads: mild tight R>L Heelcord: mild/mod tight B  LOWER EXTREMITY ROM:    Grossly WFL other than limitations indicated above due to muscle tightness   LOWER EXTREMITY MMT:    MMT Right eval Left eval  Hip flexion 3+ 4-  Hip extension 4- 4  Hip abduction 3+ 4-  Hip adduction 3+ 4-  Hip internal rotation 4- 4-  Hip external rotation 3+ 3+  Knee flexion 4 4  Knee extension 4 4  Ankle dorsiflexion 3- 3-  Ankle plantarflexion    Ankle inversion    Ankle eversion     (Blank rows = not tested)  FUNCTIONAL TESTS: (Assessment completed 05/14/24 unless otherwise indicated)  5 times sit to stand: 12.37 sec w/o UE assist - 06/01/24 Timed up and go (TUG): 13.75 sec with single hiking pole 10 meter walk test: 16.00 sec with single hiking pole Gait speed: 2.05 ft/sec with single hiking pole Berg Balance Scale: 42/56; 37-45 significant (>80%) - 05/19/24 Functional gait assessment: 10/30, < 19 = high risk fall    Standardized testing results as of discharge from latest PT episode (11/11/23): 5xSTS = 11.84 sec  = 13.63 sec w/o AD Gait speed = 2.41 ft/sec w/o AD Berg = 45/56; scores of 37-45 indicate significant (>80%) fall risk  FGA = 19/30; scores of 19-24 indicate a medium risk for falls   GAIT: Distance walked: Clinic distances Assistive device utilized: Single walking/trekking pole  on R Level of assistance: SBA Gait pattern: Increased sway, step through pattern, decreased stride length, trendelenburg, lateral hip instability, poor foot clearance- Right, and poor foot clearance- Left Comments: B foot slap   TODAY'S TREATMENT:   06/03/2024  THERAPEUTIC EXERCISE: To improve strength, endurance, and flexibility.  Demonstration, verbal and tactile cues throughout for technique.  Rec Bike - L3 x 6 min Standing lateral lean R ITB over back of chair 2 x 30 Supine ITB stretch with strap 3 x 30 bil  MANUAL THERAPY: To promote normalized muscle tension, improved flexibility, and reduced pain utilizing connective tissue massage, therapeutic massage, manual TP therapy, and myofascial release.  STM/DTM, IASTM with foam roller and manual TPR to R distal glutes, HS and ITB  NEUROMUSCULAR RE-EDUCATION: To improve coordination, kinesthesia, posture, proprioception, and amplitude of movement.  S/L R hip ABD/ext diagonals x 10 - slight PT assist initially to stabilize hips to prevent anterior/posterior rotation & VC/TC to avoid hip ER S/L R hip circumduction arc over L LE tapping R foot in front and behind L foot x 5 - very limited eccentric control with posterior tap   SELF CARE:  HEP update Provided instruction in self-STM techniques to R ITB using rolling pin.    06/01/2024 THERAPEUTIC EXERCISE: To improve strength and endurance.  Demonstration, verbal and tactile cues throughout for technique. NuStep - L4 x 6 min (LE only)  THERAPEUTIC ACTIVITIES: To improve functional performance.  Demonstration, verbal and tactile cues throughout for technique. 5xSTS = 12.37 sec w/o UE assist, somewhat uncontrolled descent esp on last rep  TRIGGER POINT DRY NEEDLING: Treatment instructions/education: Initial Treatment: Pt instructed on Dry Needling rational, procedures, and possible side effects. Pt instructed to expect mild to moderate muscle soreness later in the day and/or into the  next day.  Pt instructed in methods to reduce muscle soreness. Pt instructed to continue prescribed HEP. Patient was educated on signs and symptoms of infection and other risk factors and advised to seek medical attention should they occur.  Patient verbalized understanding of these instructions and education.  Education Handout Provided: Yes Consent: Patient Verbal Consent Given: Yes Treatment: Muscles Treated: R medial and lateral piriformis, R glute medius Utilized skilled palpation to identify bony landmarks and trigger points along with monitoring of soft tissue during DN. Electrical Stimulation Performed: No Treatment Response/Outcome: Twitch response elicited, Palpable increase in muscle length, Decreased tissue resistance noted, Decreased pain/TTP, and Improved exercise tolerance  MANUAL THERAPY: To promote normalized muscle tension, improved flexibility, and reduced pain. STM/DTM, manual TPR and pin & stretch to muscles addressed with DN   NEUROMUSCULAR RE-EDUCATION: To improve balance, coordination, kinesthesia, posture, proprioception, and amplitude of movement. Mini-squat with counter support x 10   05/19/24 THERAPEUTIC EXERCISE:  To improve ROM and flexibility.  Demonstration, verbal and tactile cues throughout for technique.  Bike L3x50min Standing runner stretch x 30 BLE Standing soleus stretch x 30 BLE Table piriformis stretch 2x30 RLE Supine L figure 4 stretch 2x30 LLE Supine glute stretch 2x30 BLE Supine KTOS stretch 2x30 BLE  Seated clams with GTB + trA x 10 BLE Seated marching GTB x + TrA 10 BLE Berg Balance Test  Sit to Stand Able to stand without using hands and stabilize independently   Standing Unsupported Able to stand 2 minutes with supervision   Sitting with Back Unsupported but Feet Supported on Floor or Stool Able to sit safely and securely 2 minutes   Stand to Sit Sits safely with minimal use of hands   Transfers Able to transfer safely, minor  use of hands   Standing Unsupported with Eyes Closed Able to stand 10 seconds with supervision   Standing Unsupported with Feet Together Able to place feet together independently and stand 1 minute safely   From Standing, Reach Forward with Outstretched Arm Can reach confidently >25 cm (10)   From Standing Position, Pick up Object from Floor Able to pick up shoe safely and easily   From Standing Position, Turn to Look Behind Over each Shoulder Looks behind one side only/other side shows less weight shift   Turn 360 Degrees Able to turn 360 degrees safely but slowly   Standing Unsupported, Alternately Place Feet on Step/Stool Able to complete 4 steps without aid or supervision   Standing Unsupported, One Foot in Front Needs help to step but can hold 15 seconds   Standing on One Leg Unable to try or needs assist to prevent fall   Total Score 42       05/14/2024  THERAPEUTIC ACTIVITIES: To improve functional performance.  Demonstration, verbal and tactile cues throughout for technique.  5xSTS: Deferred due to increased pain with uncontrolled descent upon sitting TUG: 13.75 sec with single hiking pole : 16.00 sec with single hiking pole Gait speed: 2.05 ft/sec with single hiking pole  PHYSICAL PERFORMANCE TEST or MEASUREMENT: Functional Gait  Assessment  Gait Level Surface Walks 20 ft, slow speed, abnormal gait pattern, evidence for imbalance or deviates 10-15 in outside of the 12 in walkway width. Requires more than 7 sec to ambulate 20 ft.   Change in Gait Speed Makes only minor adjustments to walking speed, or accomplishes a change in speed with significant gait deviations, deviates 10-15 in outside the 12 in walkway width, or changes speed but loses balance but is able to recover and continue walking.   Gait with Horizontal Head Turns Performs head turns with moderate changes in gait velocity, slows down, deviates 10-15 in outside 12 in walkway width but recovers, can continue to walk.    Gait with Vertical Head Turns Performs task with moderate change in gait velocity, slows down, deviates 10-15 in outside 12 in walkway width but recovers, can continue to walk.   Gait and Pivot Turn Turns slowly, requires verbal cueing, or requires several small steps to catch balance following turn and stop   Step Over Obstacle Is able to step over one shoe box (4.5 in total height) without changing gait speed. No evidence of imbalance.   Gait with Narrow Base of Support Ambulates less than 4 steps heel to toe or cannot perform without assistance.   Gait with Eyes Closed Cannot walk 20 ft without assistance, severe gait deviations or imbalance, deviates greater than 15 in outside  12 in walkway width or will not attempt task.   Ambulating Backwards Walks 20 ft, slow speed, abnormal gait pattern, evidence for imbalance, deviates 10-15 in outside 12 in walkway width.   Steps Alternating feet, must use rail.   Total Score 10   FGA comment: < 19 = high risk fall      THERAPEUTIC EXERCISE: To improve ROM and flexibility.  Demonstration, verbal and tactile cues throughout for technique.  Hooklying R HS stretch with strap hamstring 2 x 30 Supine gluteus stretch 2 x 30 bil Hooklying figure-4 piriformis stretch x 30 on L, deferred d/t increased pain on R Hooklying KTOS piriformis stretch x 30 on L, deferred d/t increased pain on R Side-sitting R modified pigeon piriformis stretch 2 x 30 - good stretch noted without increased pain Seated R KTOS piriformis stretch 2 x 30 - good stretch noted without increased pain  SELF CARE:   Modified HEP to address concerns regarding increased R buttock/hip pain with piriformis stretching, with patient reporting better tolerance for seated piriformis stretching.  Patient cautioned not to force any painful ROM/stretching and report back to PT next visit if HEP continues to irritate or cause increased pain. Provided instruction in self-STM techniques to R  glutes/piriformis using tennis ball against wall or in chair.    05/04/2024  SELF CARE:  Reviewed eval findings and role of PT in addressing identified deficits as well as instruction in initial HEP (see below).    PATIENT EDUCATION:  Education details: HEP update - ITB stretches and glute strengthening and self-STM techniques to ITB using rolling pin  Person educated: Patient Education method: Explanation, Demonstration, Verbal cues, and Handouts Education comprehension: verbalized understanding, returned demonstration, verbal cues required, and needs further education  HOME EXERCISE PROGRAM: Access Code: [facility] URL: [email or web address] Date: 06/03/2024 Prepared by: Elijah Hidden  Exercises - Standing Soleus Stretch  - 1 x daily - 7 x weekly - 3 sets - 3 reps - 30 sec hold - Hooklying Hamstring Stretch with Strap  - 2 x daily - 7 x weekly - 3 reps - 30 sec hold - Supine Gluteus Stretch  - 2 x daily - 7 x weekly - 3 reps - 30 sec hold - Supine Figure 4 Piriformis Stretch  - 2 x daily - 7 x weekly - 3 reps - 30 sec hold - Supine Piriformis Stretch with Foot on Ground  - 2 x daily - 7 x weekly - 3 reps - 30 sec hold - Seated Table Piriformis Stretch  - 2 x daily - 7 x weekly - 3 reps - 30 sec hold - Seated Piriformis Stretch  - 2 x daily - 7 x weekly - 3 reps - 30 sec hold - Seated March with Resistance  - 1 x daily - 7 x weekly - 2 sets - 10 reps - Seated Isometric Hip Abduction with Resistance  - 1 x daily - 7 x weekly - 2 sets - 10 reps - Glute Max Release With [employer] Against Wall  - 1 x daily - 7 x weekly - 1-2 min hold - Mini Squat with Counter Support  - 1 x daily - 7 x weekly - 2 sets - 10 reps - 3-5 sec hold - Standing ITB Stretch (Mirrored)  - 2 x daily - 7 x weekly - 3 reps - 30 sec hold - Supine Iliotibial Band Stretch with Strap  - 2 x daily - 7 x weekly - 3 reps - 30 sec  hold - Sidelying Diagonal Hip Abduction  - 1 x daily - 7 x weekly - 2 sets - 10  reps - 3 sec hold - Sidelying Hip Abduction and Extension (Mirrored)  - 1 x daily - 7 x weekly - 2 sets - 5 reps - 3 sec hold   ASSESSMENT:  CLINICAL IMPRESSION: Tondra unsure of benefit from TPDN - pain no worse but now in different distribution from lower buttock down R ITB.  Re-introduced ITB stretches with better stretch noted when isolating posterior aspect of hip/glutes during supine ITB stretch than TFL focus during standing IT stretch.  STM/DTM, IASTM and manual TPR incorporated to address tension in ITB and contributing muscles, with pt instructed in self-STM using rolling pin at home.  Progressed lateral hip/glute strengthening with side-lying hip exercise - significant weakness evident with fatigue limiting reps but no increased pain.  Tamla reported significant reduction in pain upon return to standing and walking at end of session.  She will benefit from continued skilled PT to address ongoing flexibility, strength and balance deficits to improve mobility and activity tolerance with decreased pain interference and decreased risk for falls.   EVAL: Lindley Rohr is a 83 y.o. female who was referred to physical therapy for evaluation and treatment for R hip/buttock pain presumed to be the result of lumbar radiculopathy.  Patient reports onset of R buttock pain beginning Memorial Day weekend 2025.  Pain is worse with walking and positional changes.  Patient has deficits in hip ROM, proximal LE flexibility, B LE strength, abnormal posture, and TTP with abnormal muscle tension in R lower glutes and piriformis which are interfering with ADLs and are impacting quality of life.  On Modified Oswestry patient scored 17/50 demonstrating 34% or moderate disability.  On LEFS patient scored 26/80 demonstrating moderate functional limitation.  Given her significant LE weakness and impaired gait pattern, will plan more formalized balance testing next visit.  [place] will benefit from skilled PT to address  above deficits to improve mobility and activity tolerance with decreased pain interference.     OBJECTIVE IMPAIRMENTS: Abnormal gait, decreased activity tolerance, decreased balance, decreased knowledge of condition, decreased mobility, difficulty walking, decreased ROM, decreased strength, increased fascial restrictions, impaired perceived functional ability, increased muscle spasms, impaired flexibility, impaired sensation, improper body mechanics, postural dysfunction, and pain.   ACTIVITY LIMITATIONS: carrying, lifting, bending, standing, squatting, sleeping, stairs, transfers, bed mobility, bathing, toileting, dressing, reach over head, hygiene/grooming, locomotion level, and caring for others  PARTICIPATION LIMITATIONS: meal prep, cleaning, laundry, driving, shopping, community activity, yard work, and church  PERSONAL FACTORS: Age, Past/current experiences, Time since onset of injury/illness/exacerbation, and 3+ comorbidities: L2-3 XLIF/PSF 05/24/22; C5-7 ACDF 09/21/20; idiopathic progressive neuropathy; hypthyroidism; DDD; remote h/o back surgery at age 21; h/o scoliosis; 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   REHAB POTENTIAL: Good  CLINICAL DECISION MAKING: Unstable/unpredictable  EVALUATION COMPLEXITY: High   GOALS: Goals reviewed with patient? Yes  SHORT TERM GOALS: Target date: 06/15/2024  Patient will be independent with initial HEP to improve outcomes and carryover.  Baseline: Initial HEP provided on eval Goal status: MET - 05/19/24  2.  Patient will report 25% improvement in low back pain to improve QOL. Baseline: 2/10 on eval, up to 10/10 Goal status: IN PROGRESS - 06/03/24 - improvement in pain noted by end of today's session  3.  Complete standardized balance assessment and update LTG's as  indicated. Baseline:  05/14/24 -  , TUG and FGA completed; Berg and 5xSTS pending  05/19/24 [facility] Levins completed Goal status: MET - 06/01/24 - 5xSTS completed  LONG TERM GOALS: Target date: 07/20/2024  Patient will be independent with ongoing/advanced HEP for self-management at home.  Baseline:  Goal status: INITIAL  2.  Patient will report 50-75% improvement in R hip/pelvic pain to improve QOL.  Baseline: 2/10 on eval, up to 10/10 Goal status: INITIAL  3.  Patient will demonstrate improved B proximal LE strength to >/= 4 to 4+/5 for improved stability and ease of mobility. Baseline: Refer to above LE MMT table Goal status: INITIAL  4.  Patient will report >/= 35/80 on LEFS (MCID = 9 pts) to demonstrate improved functional ability. Baseline: 26 / 80 = 32.5 % Goal status: INITIAL   5. Patient will report </= 22% on Modified Oswestry (MCID = 12%) to demonstrate improved functional ability with decreased pain interference. Baseline: 17 / 50 = 34.0 % Goal status: INITIAL  6.  Patient will resume her normal walking routine w/o increased pain to allow for  improved mobility and activity tolerance. Baseline: R ischial pain limiting walking tolerance Goal status: INITIAL  7.  Patient will improve Berg score by at least 8 points or to >/= 45/56 (baseline as of prior D/C from PT) to improve safety and stability with ADLs in standing and reduce risk for falls.  Baseline: TBA Goal status: INITIAL  8.  Patient will improve FGA score by at least 4 points or to >/= 19/30 (baseline as of prior D/C from PT) to improve gait stability and reduce risk for falls.   Baseline: TBA Goal status: INITIAL   9.  Patient will improve gait velocity to at least 2.62 ft/sec for improved gait efficiency and safety with community ambulation.  Baseline: TBA Goal status: INITIAL   PLAN:  PT FREQUENCY: 2x/week  PT DURATION: 10 weeks  PLANNED INTERVENTIONS: 97164- PT Re-evaluation, 97750- Physical Performance Testing, 97110-Therapeutic  exercises, 97530- Therapeutic activity, [id number]- Neuromuscular re-education, 97535- Self Care, 02859- Manual therapy, [phone]- Gait training, [phone]- Electrical stimulation (unattended), [phone]- Ultrasound, 02966- Ionotophoresis 4mg /ml Dexamethasone, 79439 (1-2 muscles), 20561 (3+ muscles)- Dry Needling, Patient/Family education, Balance training, Stair training, Taping, Joint mobilization, DME instructions, Cryotherapy, and Moist heat  PLAN FOR NEXT SESSION: STG #2 reassessment; Stretch progression as indicated; Progress gentle lumbopelvic/proximal LE strengthening as pain allows; MT +/- TPDN to address abnormal muscle tension in glutes and piriformis   Elijah CHRISTELLA Hidden, PT 06/03/2024, 4:43 PM

## 2024-06-07 ENCOUNTER — Encounter: Admitting: Physical Therapy

## 2024-06-08 ENCOUNTER — Encounter: Payer: Self-pay | Admitting: Physical Therapy

## 2024-06-08 ENCOUNTER — Ambulatory Visit: Admitting: Physical Therapy

## 2024-06-08 DIAGNOSIS — M5416 Radiculopathy, lumbar region: Secondary | ICD-10-CM

## 2024-06-08 DIAGNOSIS — R2689 Other abnormalities of gait and mobility: Secondary | ICD-10-CM

## 2024-06-08 DIAGNOSIS — M25551 Pain in right hip: Secondary | ICD-10-CM

## 2024-06-08 DIAGNOSIS — R252 Cramp and spasm: Secondary | ICD-10-CM

## 2024-06-08 DIAGNOSIS — M6281 Muscle weakness (generalized): Secondary | ICD-10-CM

## 2024-06-08 DIAGNOSIS — R2681 Unsteadiness on feet: Secondary | ICD-10-CM

## 2024-06-08 NOTE — Therapy (Signed)
 OUTPATIENT PHYSICAL THERAPY TREATMENT   Patient Name: [name] Trujillo MRN: [id number] DOB:02-Apr-1941, 83 y.o., female Today's Date: 06/08/2024  END OF SESSION:  PT End of Session - 06/08/24 1314     Visit Number 6    Date for Recertification  07/20/24    Authorization Type [employer]    Authorization Time Period 05/11/24 - 08/09/24    Authorization - Visit Number 6    Authorization - Number of Visits 10    Progress Note Due on Visit 10    PT Start Time 1314    PT Stop Time 1402    PT Time Calculation (min) 48 min    Activity Tolerance Patient tolerated treatment well    Behavior During Therapy [name] [facility] for tasks assessed/performed               Past Medical History:  Diagnosis Date   High cholesterol    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   She had ACUTE TAKOTSUBO (STRESS) CARDIOMYOPATHY with elevated Troponin Levels - this would be considered Demand Ischemia - Demand Infarction & NOT associated with ACS/CAD.     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  - Cath with NO CAD. ==> RESOLVED: f/u Echo 03/2022: EF 60-65%.   Past Surgical History:  Procedure Laterality Date   APPENDECTOMY     7-18 yo   BACK SURGERY     Age 68   CESAREAN SECTION     ECTOPIC PREGNANCY SURGERY     LAPAROSCOPIC HYSTERECTOMY     LEFT HEART CATH AND CORONARY ANGIOGRAPHY N/A 01/09/2022   Procedure: LEFT HEART CATH AND CORONARY ANGIOGRAPHY;  Surgeon: [name] Sharper, MD;  [name];  Service: CV:: Widely patent coronaries with mild nonobstructive LAD plaquing.  Right dominant system.  Normal LVEDP.  Based on clinical presentation, findings are consistent with acute Takotsubo  Cardiomyopathy Syndrome   POSTERIOR FUSION LUMBAR SPINE  05/24/2022   [facility], [place]; [name], MD): L2-L3 XLIF, L2-L3 POSTERIOR DECOMPRESSION AND FUSION   TRANSTHORACIC ECHOCARDIOGRAM  01/08/2022   Severely decreased LV function-EF 25 to 30%.  Mid to apical (mostly anterior) with normal basal motion.  GR 2 DD-elevated LAP.  Mildly dilated LA.  Aortic sclerosis with no stenosis.  No AI.  Normal MV with mild to moderate TR.  Mildly elevated RAP, and PAP (estimated 49 mg). If LAD CAD ruled out - consistent with Takotsubo CM Syndrome.   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) 12/30/2023   New onset atrial fibrillation (HCC) 12/30/2023   Myopia of both eyes with astigmatism and presbyopia 05/06/2023   Dry eye syndrome of both eyes 05/06/2023   Mild tricuspid regurgitation 05/06/2022   Mild mitral regurgitation 05/06/2022   Hypertension 05/06/2022   History of seizure 05/06/2022   Takotsubo cardiomyopathy-resolved 01/10/2022   Hyperlipidemia  with target LDL less than 100 01/10/2022   Hypothyroid 01/10/2022   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) 01/08/2022   Numbness in both legs 04/10/2021   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   Posterior vitreous detachment of both eyes 02/05/2018   Ventricular premature beats 10/31/2015   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   Pure hypercholesterolemia 06/28/2014   Plantar fasciitis 06/28/2014    PCP: [name] Coy, MD   REFERRING PROVIDER: Vernetta Lonni GRADE, MD  REFERRING DIAG:  [phone] (ICD-10-CM) - Pain in right hip   M54.50 (ICD-10-CM) - Low back pain, unspecified  Eval and treat low back/right pelvis and right hip/ishium Any modalities per therapist   THERAPY DIAG:  Radiculopathy, lumbar region  Pain in right hip  Muscle weakness (generalized)  Cramp and spasm  Other abnormalities of gait and mobility  Unsteadiness on feet  RATIONALE FOR EVALUATION AND TREATMENT: Rehabilitation  ONSET DATE: Memorial Day weekend  NEXT MD VISIT: 07/21/2024 or 07/22/2024   SUBJECTIVE:                                                                                                                                                                                                         SUBJECTIVE STATEMENT: Pt reports no pain while she does her HEP exercises but notes difficulty sleeping at night due to muscle cramping with pain up to 8/10 disrupting her sleep.  EVAL: [name] Trujillo is well know to me from several prior PT episodes for foot drop, cervical and lumbar radiculopathy.  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  Woke up the next morning with severe R hip/buttock pain.  Pain mostly localized to sciatic and ischial area of her R buttock.  PCP provider told her it was likely hip bursitis but ortho MD feels like it is more likely radicular from her back.  Some relief from steroid injection around her right hip ischial area provided by ortho MD but pain has never fully gone away.   She does have a significant lurch in her gait which is somewhat more pronounced than on prior PT episodes with this PT.  PAIN: Are you having pain? No and Yes: NPRS scale: 0/10  Pain location: R lower buttock, with radicular pain down lateral leg (ITB distrubution)  Pain description: constant   Aggravating factors: putting more weight on R LE  Relieving factors: Theraworx cream   PERTINENT HISTORY:  L2-3 XLIF/PSF 05/24/22; C5-7 ACDF 09/21/20;  idiopathic progressive  neuropathy; hypthyroidism; DDD; remote h/o back surgery at age 63; h/o scoliosis; Takotsubo cardiomyopathy 01/10/22; CAD; paroxysmal afib; mitral and tricuspid regurgitation; myalgia due to statin; recent cataract surgery  PRECAUTIONS: Fall  RED FLAGS: Bowel or bladder incontinence: Yes: chronic bladder issues, bowel urgency - somewhat worse than before   WEIGHT BEARING RESTRICTIONS: No  FALLS:  Has patient fallen in last 6 months? No  LIVING ENVIRONMENT: Lives with: lives with their spouse Lives in: House/apartment Stairs: Yes: External: 5-6 steps; on left going up Has following equipment at home: Walker - 2 wheeled, shower chair, and hiking/walking poles  OCCUPATION: Retired Runner, broadcasting/film/video  PLOF: Independent and Leisure: walking ~30 minutes 1x/day (3x around the court near where she lives); sewing/quilting; likes working in the yard    PATIENT GOALS: To start walking again w/o doing more damage.   OBJECTIVE: (objective measures completed at initial evaluation unless otherwise dated)  DIAGNOSTIC FINDINGS:  04/21/24 - XR RIGHT HIP: An AP pelvis and lateral of the right hip shows well-maintained joint space with no cortical irregularities around the hip or the trochanteric area of the hip.  The lower lumbar spine shows severe degenerative changes at L4-L5 and L5-S1 on the AP view.   08/27/22 - LUMBAR SPINE - COMPLETE 4+ VIEW FINDINGS: There is mild levoconvex curvature of the lumbar spine, unchanged. Alignment is anatomic. There are bilateral transpedicular screws in posterior fusion rods present at L2-L3, new from prior. Disc spacers present. Alignment is anatomic. No fractures are identified. There is moderate severe disc space narrowing at L4-L5 and L5-S1 as well as L1-L2 similar to the prior study. There are atherosclerotic calcifications of the aorta.   IMPRESSION: 1. Status post posterior fusion at L2-L3. Alignment is anatomic. 2. Multilevel degenerative disc  disease, similar to prior study.  PATIENT SURVEYS:  Modified Oswestry:  MODIFIED OSWESTRY DISABILITY SCALE  Date:  05/11/2024  Pain intensity 2 =  Pain medication provides me with complete relief from pain.  2. Personal care (washing, dressing, etc.) 1 =  I can take care of myself normally, but it increases my pain.  3. Lifting 4 = I can lift only very light weights  4. Walking 1 = Pain prevents me from walking more than 1 mile.  5. Sitting 1 =  I can only sit in my favorite chair as long as I like.  6. Standing 2 =  Pain prevents me from standing more than 1 hour  7. Sleeping 2 =  Even when I take pain medication, I sleep less than 6 hours  8. Social Life 2 = Pain prevents me from participating in more energetic activities (eg. sports, dancing).  9. Traveling 2 =  My pain restricts my travel over 2 hours.  10. Employment/ Homemaking 2 = I can perform most of my homemaking/job duties, but pain prevents me from performing more physically stressful activities (eg, lifting, vacuuming).  Total 17 / 50 = 34.0 %  Interpretation Moderate disability   Interpretation of scores: Score Category Description  0-20% Minimal Disability The patient can cope with most living activities. Usually no treatment is indicated apart from advice on lifting, sitting and exercise  21-40% Moderate Disability The patient experiences more pain and difficulty with sitting, lifting and standing. Travel and social life are more difficult and they may be disabled from work. Personal care, sexual activity and sleeping are not grossly affected, and the patient can usually be managed by conservative means  41-60% Severe Disability Pain remains the main problem in  this group, but activities of daily living are affected. These patients require a detailed investigation  61-80% Crippled Back pain impinges on all aspects of the patient's life. Positive intervention is required  81-100% Bed-bound These patients are either bed-bound or  exaggerating their symptoms  Bluford FORBES Zoe DELENA Karon DELENA, et al. Surgery versus conservative management of stable thoracolumbar fracture: the PRESTO feasibility RCT. [place]): [employer]; 2021 Nov. [facility] Technology Assessment, No. 25.62.) Appendix 3, Oswestry Disability Index category descriptors. Available from: [employer]cz Minimally Clinically Important Difference (MCID) = 12.8%  LEFS  Extreme difficulty/unable (0), Quite a bit of difficulty (1), Moderate difficulty (2), Little difficulty (3), No difficulty (4) Survey date:  05/11/24  Any of your usual work, housework or school activities 1  2. Usual hobbies, recreational or sporting activities 2  3. Getting into/out of the bath 2  4. Walking between rooms 2  5. Putting on socks/shoes 2  6. Squatting  0  7. Lifting an object, like a bag of groceries from the floor 2  8. Performing light activities around your home 2  9. Performing heavy activities around your home 0  10. Getting into/out of a car 2  11. Walking 2 blocks 0  12. Walking 1 mile 2  13. Going up/down 10 stairs (1 flight) 2  14. Standing for 1 hour 2  15.  sitting for 1 hour 3  16. Running on even ground 0  17. Running on uneven ground 0  18. Making sharp turns while running fast 0  19. Hopping  0  20. Rolling over in bed 2  Score total:   26 / 80 = 32.5 %  Functional limitation: Moderate     SCREENING FOR RED FLAGS: Bowel or bladder incontinence: Yes: chronic bladder issues, bowel urgency - somewhat worse than before Spinal tumors: No Cauda equina syndrome: No Compression fracture: No Abdominal aneurysm: No  COGNITION:  Overall cognitive status: Within functional limits for tasks assessed    SENSATION: WFL Except B distal LE neuropathy  POSTURE:  rounded shoulders, forward head, right pelvic obliquity, and scoliosis  PALPATION: Increased muscle tension in L>R lumbar paraspinals and R>L glutes and  piriformis with TTP over inferior glutes and piriformis near hamstring origin  LUMBAR ROM: *Lumbar ROM assessed with UE support for balance and close SBA/CGA of PT  Active  Eval  Flexion Hands to distal shins  Extension 50% limited  Right lateral flexion Hand to upper thigh - p!  Left lateral flexion Hand to lateral knee  Right rotation 50% limited  Left rotation 30% limited - p!  (Blank rows = not tested)  MUSCLE LENGTH: Hamstrings: mild/mod tight B ITB: mild tight B Piriformis: mod/severe tight B Hip IR: mod tight B Hip flexors: mild tight R Quads: mild tight R>L Heelcord: mild/mod tight B  LOWER EXTREMITY ROM:    Grossly WFL other than limitations indicated above due to muscle tightness   LOWER EXTREMITY MMT:    MMT Right eval Left eval  Hip flexion 3+ 4-  Hip extension 4- 4  Hip abduction 3+ 4-  Hip adduction 3+ 4-  Hip internal rotation 4- 4-  Hip external rotation 3+ 3+  Knee flexion 4 4  Knee extension 4 4  Ankle dorsiflexion 3- 3-  Ankle plantarflexion    Ankle inversion    Ankle eversion     (Blank rows = not tested)  FUNCTIONAL TESTS: (Assessment completed 05/14/24 unless otherwise indicated) 5 times sit to stand: 12.37  sec w/o UE assist - 06/01/24 Timed up and go (TUG): 13.75 sec with single hiking pole 10 meter walk test: 16.00 sec with single hiking pole Gait speed: 2.05 ft/sec with single hiking pole Berg Balance Scale: 42/56; 37-45 significant (>80%) - 05/19/24 Functional gait assessment: 10/30, < 19 = high risk fall    Standardized testing results as of discharge from latest PT episode (11/11/23): 5xSTS = 11.84 sec  = 13.63 sec w/o AD Gait speed = 2.41 ft/sec w/o AD Berg = 45/56; scores of 37-45 indicate significant (>80%) fall risk  FGA = 19/30; scores of 19-24 indicate a medium risk for falls   GAIT: Distance walked: Clinic distances Assistive device utilized: Single walking/trekking pole on R Level of assistance: SBA Gait pattern:  Increased sway, step through pattern, decreased stride length, trendelenburg, lateral hip instability, poor foot clearance- Right, and poor foot clearance- Left Comments: B foot slap   TODAY'S TREATMENT:   06/08/2024 THERAPEUTIC EXERCISE: To improve strength, endurance, ROM, and flexibility.  Demonstration, verbal and tactile cues throughout for technique. Rec Bike - L3 x 6 min Seated 3-way thoracolumbar flexion stretch 2 x 30 each position - 1st reps with hands on green Pball, 2nd reps using hiking pole for UE support Quadruped 3-way child's pose x 30 each  THERAPEUTIC ACTIVITIES: To improve functional performance.  Demonstration, verbal and tactile cues throughout for technique. Reviewed proper lifting mechanics using squatting motion and golfer's lift  Unsupported mini-squat hovering over mat table with arms forward for balance (chair placed in front for safety but not needed for support) x 8  SELF CARE:  Provided education on managing muscle cramping through stretching, increased hydration, and potential Mg supplement (instructed pt to verify with PCP that there are no concerns or potential medicine interactions with trying Mg supplement).   06/03/2024  THERAPEUTIC EXERCISE: To improve strength, endurance, and flexibility.  Demonstration, verbal and tactile cues throughout for technique.  Rec Bike - L3 x 6 min Standing lateral lean R ITB over back of chair 2 x 30 Supine ITB stretch with strap 3 x 30 bil  MANUAL THERAPY: To promote normalized muscle tension, improved flexibility, and reduced pain utilizing connective tissue massage, therapeutic massage, manual TP therapy, and myofascial release.  STM/DTM, IASTM with foam roller and manual TPR to R distal glutes, HS and ITB  NEUROMUSCULAR RE-EDUCATION: To improve coordination, kinesthesia, posture, proprioception, and amplitude of movement.  S/L R hip ABD/ext diagonals x 10 - slight PT assist initially to stabilize hips to prevent  anterior/posterior rotation & VC/TC to avoid hip ER S/L R hip circumduction arc over L LE tapping R foot in front and behind L foot x 5 - very limited eccentric control with posterior tap   SELF CARE:  HEP update Provided instruction in self-STM techniques to R ITB using rolling pin.    06/01/2024 THERAPEUTIC EXERCISE: To improve strength and endurance.  Demonstration, verbal and tactile cues throughout for technique. NuStep - L4 x 6 min (LE only)  THERAPEUTIC ACTIVITIES: To improve functional performance.  Demonstration, verbal and tactile cues throughout for technique. 5xSTS = 12.37 sec w/o UE assist, somewhat uncontrolled descent esp on last rep  TRIGGER POINT DRY NEEDLING: Treatment instructions/education: Initial Treatment: Pt instructed on Dry Needling rational, procedures, and possible side effects. Pt instructed to expect mild to moderate muscle soreness later in the day and/or into the next day.  Pt instructed in methods to reduce muscle soreness. Pt instructed to continue prescribed HEP. Patient was educated  on signs and symptoms of infection and other risk factors and advised to seek medical attention should they occur.  Patient verbalized understanding of these instructions and education.  Education Handout Provided: Yes Consent: Patient Verbal Consent Given: Yes Treatment: Muscles Treated: R medial and lateral piriformis, R glute medius Utilized skilled palpation to identify bony landmarks and trigger points along with monitoring of soft tissue during DN. Electrical Stimulation Performed: No Treatment Response/Outcome: Twitch response elicited, Palpable increase in muscle length, Decreased tissue resistance noted, Decreased pain/TTP, and Improved exercise tolerance  MANUAL THERAPY: To promote normalized muscle tension, improved flexibility, and reduced pain. STM/DTM, manual TPR and pin & stretch to muscles addressed with DN   NEUROMUSCULAR RE-EDUCATION: To improve  balance, coordination, kinesthesia, posture, proprioception, and amplitude of movement. Mini-squat with counter support x 10   05/19/24 THERAPEUTIC EXERCISE: To improve ROM and flexibility.  Demonstration, verbal and tactile cues throughout for technique.  Bike L3x30min Standing runner stretch x 30 BLE Standing soleus stretch x 30 BLE Table piriformis stretch 2x30 RLE Supine L figure 4 stretch 2x30 LLE Supine glute stretch 2x30 BLE Supine KTOS stretch 2x30 BLE  Seated clams with GTB + trA x 10 BLE Seated marching GTB x + TrA 10 BLE Berg Balance Test  Sit to Stand Able to stand without using hands and stabilize independently   Standing Unsupported Able to stand 2 minutes with supervision   Sitting with Back Unsupported but Feet Supported on Floor or Stool Able to sit safely and securely 2 minutes   Stand to Sit Sits safely with minimal use of hands   Transfers Able to transfer safely, minor use of hands   Standing Unsupported with Eyes Closed Able to stand 10 seconds with supervision   Standing Unsupported with Feet Together Able to place feet together independently and stand 1 minute safely   From Standing, Reach Forward with Outstretched Arm Can reach confidently >25 cm (10)   From Standing Position, Pick up Object from Floor Able to pick up shoe safely and easily   From Standing Position, Turn to Look Behind Over each Shoulder Looks behind one side only/other side shows less weight shift   Turn 360 Degrees Able to turn 360 degrees safely but slowly   Standing Unsupported, Alternately Place Feet on Step/Stool Able to complete 4 steps without aid or supervision   Standing Unsupported, One Foot in Front Needs help to step but can hold 15 seconds   Standing on One Leg Unable to try or needs assist to prevent fall   Total Score 42       05/14/2024  THERAPEUTIC ACTIVITIES: To improve functional performance.  Demonstration, verbal and tactile cues throughout for technique.  5xSTS:  Deferred due to increased pain with uncontrolled descent upon sitting TUG: 13.75 sec with single hiking pole : 16.00 sec with single hiking pole Gait speed: 2.05 ft/sec with single hiking pole  PHYSICAL PERFORMANCE TEST or MEASUREMENT: Functional Gait  Assessment  Gait Level Surface Walks 20 ft, slow speed, abnormal gait pattern, evidence for imbalance or deviates 10-15 in outside of the 12 in walkway width. Requires more than 7 sec to ambulate 20 ft.   Change in Gait Speed Makes only minor adjustments to walking speed, or accomplishes a change in speed with significant gait deviations, deviates 10-15 in outside the 12 in walkway width, or changes speed but loses balance but is able to recover and continue walking.   Gait with Horizontal Head Turns Performs head turns with moderate  changes in gait velocity, slows down, deviates 10-15 in outside 12 in walkway width but recovers, can continue to walk.   Gait with Vertical Head Turns Performs task with moderate change in gait velocity, slows down, deviates 10-15 in outside 12 in walkway width but recovers, can continue to walk.   Gait and Pivot Turn Turns slowly, requires verbal cueing, or requires several small steps to catch balance following turn and stop   Step Over Obstacle Is able to step over one shoe box (4.5 in total height) without changing gait speed. No evidence of imbalance.   Gait with Narrow Base of Support Ambulates less than 4 steps heel to toe or cannot perform without assistance.   Gait with Eyes Closed Cannot walk 20 ft without assistance, severe gait deviations or imbalance, deviates greater than 15 in outside 12 in walkway width or will not attempt task.   Ambulating Backwards Walks 20 ft, slow speed, abnormal gait pattern, evidence for imbalance, deviates 10-15 in outside 12 in walkway width.   Steps Alternating feet, must use rail.   Total Score 10   FGA comment: < 19 = high risk fall      THERAPEUTIC EXERCISE: To  improve ROM and flexibility.  Demonstration, verbal and tactile cues throughout for technique.  Hooklying R HS stretch with strap hamstring 2 x 30 Supine gluteus stretch 2 x 30 bil Hooklying figure-4 piriformis stretch x 30 on L, deferred d/t increased pain on R Hooklying KTOS piriformis stretch x 30 on L, deferred d/t increased pain on R Side-sitting R modified pigeon piriformis stretch 2 x 30 - good stretch noted without increased pain Seated R KTOS piriformis stretch 2 x 30 - good stretch noted without increased pain  SELF CARE:   Modified HEP to address concerns regarding increased R buttock/hip pain with piriformis stretching, with patient reporting better tolerance for seated piriformis stretching.  Patient cautioned not to force any painful ROM/stretching and report back to PT next visit if HEP continues to irritate or cause increased pain. Provided instruction in self-STM techniques to R glutes/piriformis using tennis ball against wall or in chair.    05/04/2024  SELF CARE:  Reviewed eval findings and role of PT in addressing identified deficits as well as instruction in initial HEP (see below).    PATIENT EDUCATION:  Education details: HEP update - thoracolumbar flexion stretches and unsupported minisquat, and proper lifting mechanics.  Person educated: Patient Education method: Explanation, Demonstration, Verbal cues, and Handouts Education comprehension: verbalized understanding, returned demonstration, verbal cues required, and needs further education  HOME EXERCISE PROGRAM: Access Code: [facility] URL: [email or web address] Date: 06/08/2024 Prepared by: Elijah Hidden  Exercises - Standing Soleus Stretch  - 1 x daily - 7 x weekly - 3 sets - 3 reps - 30 sec hold - Hooklying Hamstring Stretch with Strap  - 2 x daily - 7 x weekly - 3 reps - 30 sec hold - Supine Gluteus Stretch  - 2 x daily - 7 x weekly - 3 reps - 30 sec hold - Supine Figure 4 Piriformis  Stretch  - 2 x daily - 7 x weekly - 3 reps - 30 sec hold - Supine Piriformis Stretch with Foot on Ground  - 2 x daily - 7 x weekly - 3 reps - 30 sec hold - Seated Table Piriformis Stretch  - 2 x daily - 7 x weekly - 3 reps - 30 sec hold - Seated Piriformis Stretch  - 2 x daily - 7  x weekly - 3 reps - 30 sec hold - Seated March with Resistance  - 1 x daily - 7 x weekly - 2 sets - 10 reps - Seated Isometric Hip Abduction with Resistance  - 1 x daily - 7 x weekly - 2 sets - 10 reps - Glute Max Release With [employer] Against Wall  - 1 x daily - 7 x weekly - 1-2 min hold - Mini Squat with Counter Support  - 1 x daily - 3 x weekly - 2 sets - 10 reps - 3-5 sec hold - Standing ITB Stretch (Mirrored)  - 2 x daily - 7 x weekly - 3 reps - 30 sec hold - Supine Iliotibial Band Stretch with Strap  - 2 x daily - 7 x weekly - 3 reps - 30 sec hold - Sidelying Diagonal Hip Abduction  - 1 x daily - 3 x weekly - 2 sets - 10 reps - 3 sec hold - Sidelying Hip Abduction and Extension (Mirrored)  - 1 x daily - 3 x weekly - 2 sets - 5 reps - 3 sec hold - Seated 3 Way Exercise Ball Roll Out Stretch  - 1 x daily - 7 x weekly - 2 sets - 3 reps - 30 sec hold - Mini Squat with Chair  - 1 x daily - 3 x weekly - 2 sets - 10 reps - 3 sec hold  Patient Education - Lifting Techniques   ASSESSMENT:  CLINICAL IMPRESSION: Katerina reports 50+% improvement in pain since start of PT with pain now typically not an issue during the day, but sleep still disturbed at night due to pain and muscle cramping.  She also notes difficulty reaching down to pick things up from the floor due to low back/hip discomfort which did not use to be an issue for her.  Introduced thoracolumbar flexion and flexion with rotation stretches to help address continued tightness in low back.  Also provided instruction in proper lifting mechanics to reduce strain on low back.  Worked on balance with squatting mechanics to improve safety with lifting tasks.   Finesse is progressing well toward her PT goals with all STGs now met.  She will benefit from continued skilled PT to address ongoing flexibility, strength and balance deficits to improve mobility and activity tolerance with decreased pain interference and decreased risk for falls.  EVAL: Marva Beckerman is a 83 y.o. female who was referred to physical therapy for evaluation and treatment for R hip/buttock pain presumed to be the result of lumbar radiculopathy.  Patient reports onset of R buttock pain beginning Memorial Day weekend 2025.  Pain is worse with walking and positional changes.  Patient has deficits in hip ROM, proximal LE flexibility, B LE strength, abnormal posture, and TTP with abnormal muscle tension in R lower glutes and piriformis which are interfering with ADLs and are impacting quality of life.  On Modified Oswestry patient scored 17/50 demonstrating 34% or moderate disability.  On LEFS patient scored 26/80 demonstrating moderate functional limitation.  Given her significant LE weakness and impaired gait pattern, will plan more formalized balance testing next visit.  Adalene will benefit from skilled PT to address above deficits to improve mobility and activity tolerance with decreased pain interference.     OBJECTIVE IMPAIRMENTS: Abnormal gait, decreased activity tolerance, decreased balance, decreased knowledge of condition, decreased mobility, difficulty walking, decreased ROM, decreased strength, increased fascial restrictions, impaired perceived functional ability, increased muscle spasms, impaired flexibility, impaired sensation, improper body  mechanics, postural dysfunction, and pain.   ACTIVITY LIMITATIONS: carrying, lifting, bending, standing, squatting, sleeping, stairs, transfers, bed mobility, bathing, toileting, dressing, reach over head, hygiene/grooming, locomotion level, and caring for others  PARTICIPATION LIMITATIONS: meal prep, cleaning, laundry, driving, shopping,  community activity, yard work, and church  PERSONAL FACTORS: Age, Past/current experiences, Time since onset of injury/illness/exacerbation, and 3+ comorbidities: L2-3 XLIF/PSF 05/24/22; C5-7 ACDF 09/21/20; idiopathic progressive neuropathy; hypthyroidism; DDD; remote h/o back surgery at age 72; h/o scoliosis; 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   REHAB POTENTIAL: Good  CLINICAL DECISION MAKING: Unstable/unpredictable  EVALUATION COMPLEXITY: High   GOALS: Goals reviewed with patient? Yes  SHORT TERM GOALS: Target date: 06/15/2024  Patient will be independent with initial HEP to improve outcomes and carryover.  Baseline: Initial HEP provided on eval Goal status: MET - 05/19/24  2.  Patient will report 25% improvement in low back pain to improve QOL. Baseline: 2/10 on eval, up to 10/10 06/03/24 - improvement in pain noted by end of today's session Goal status: MET - 06/08/24 - Pt reports 50+% improvement in pain thus far  3.  Complete standardized balance assessment and update LTG's as indicated. Baseline:  05/14/24 - , TUG and FGA completed; Berg and 5xSTS pending  05/19/24 GLENWOOD Levins completed Goal status: MET - 06/01/24 - 5xSTS completed  LONG TERM GOALS: Target date: 07/20/2024  Patient will be independent with ongoing/advanced HEP for self-management at home.  Baseline:  Goal status: INITIAL  2.  Patient will report 50-75% improvement in R hip/pelvic pain to improve QOL.  Baseline: 2/10 on eval, up to 10/10 Goal status: IN PROGRESS - 06/08/24 - Pt reports 50+% improvement in pain thus far  3.  Patient will demonstrate improved B proximal LE strength to >/= 4 to 4+/5 for improved stability and ease of mobility. Baseline: Refer to above LE MMT table Goal status: INITIAL  4.  Patient will report >/= 35/80 on LEFS (MCID = 9 pts) to  demonstrate improved functional ability. Baseline: 26 / 80 = 32.5 % Goal status: INITIAL   5. Patient will report </= 22% on Modified Oswestry (MCID = 12%) to demonstrate improved functional ability with decreased pain interference. Baseline: 17 / 50 = 34.0 % Goal status: INITIAL  6.  Patient will resume her normal walking routine w/o increased pain to allow for  improved mobility and activity tolerance. Baseline: R ischial pain limiting walking tolerance Goal status: INITIAL  7.  Patient will improve Berg score by at least 8 points or to >/= 45/56 (baseline as of prior D/C from PT) to improve safety and stability with ADLs in standing and reduce risk for falls.  Baseline: 42/56 (05/19/24) Goal status: INITIAL  8.  Patient will improve FGA score by at least 4 points or to >/= 19/30 (baseline as of prior D/C from PT) to improve gait stability and reduce risk for falls.   Baseline: 10/30 (05/14/24) Goal status: INITIAL   9.  Patient will improve gait velocity to at least 2.62 ft/sec for improved gait efficiency and safety with community ambulation.  Baseline: 2.05 ft/sec with single hiking pole (05/14/24) Goal status: INITIAL     PLAN:  PT FREQUENCY: 2x/week  PT DURATION: 10 weeks  PLANNED INTERVENTIONS: 97164- PT Re-evaluation, 97750- Physical Performance Testing, 97110-Therapeutic exercises, 97530- Therapeutic activity, [id number]- Neuromuscular re-education, 97535- Self Care, 02859- Manual therapy, [id number]- Gait training, [phone]- Electrical stimulation (  unattended), [phone]- Ultrasound, 02966- Ionotophoresis 4mg /ml Dexamethasone, 20560 (1-2 muscles), 20561 (3+ muscles)- Dry Needling, Patient/Family education, Balance training, Stair training, Taping, Joint mobilization, DME instructions, Cryotherapy, and Moist heat  PLAN FOR NEXT SESSION: Stretch progression as indicated; Progress gentle lumbopelvic/proximal LE strengthening as pain allows; MT +/- TPDN to address abnormal muscle tension in glutes  and piriformis   Elijah CHRISTELLA Hidden, PT 06/08/2024, 2:17 PM

## 2024-06-10 ENCOUNTER — Encounter: Admitting: Physical Therapy

## 2024-06-15 ENCOUNTER — Encounter: Admitting: Physical Therapy

## 2024-06-17 ENCOUNTER — Encounter: Admitting: Physical Therapy

## 2024-06-17 ENCOUNTER — Telehealth: Payer: Self-pay | Admitting: Cardiology

## 2024-06-17 MED ORDER — METOPROLOL SUCCINATE ER 25 MG PO TB24: 12.5000 mg | ORAL_TABLET | Freq: Every day | ORAL | 1 refills | Status: AC

## 2024-06-17 NOTE — Telephone Encounter (Signed)
*  STAT* If patient is at the pharmacy, call can be transferred to refill team.   1. Which medications need to be refilled? (please list name of each medication and dose if known) metoprolol  succinate (TOPROL  XL) 25 MG 24 hr tablet    2. Would you like to learn more about the convenience, safety, & potential cost savings by using the [facility]?     3. Are you open to using the [facility] (Type [facility].  ).   4. Which pharmacy/location (including street and city if local pharmacy) is medication to be sent to? [facility] [address] - [place], [place] - [address]    5. Do they need a 30 day or 90 day supply? 90 day

## 2024-06-17 NOTE — Telephone Encounter (Signed)
 Pt's medication was sent to pt's pharmacy as requested. Confirmation received.

## 2024-06-18 ENCOUNTER — Other Ambulatory Visit: Payer: Self-pay

## 2024-06-22 ENCOUNTER — Encounter: Payer: Self-pay | Admitting: Physical Therapy

## 2024-06-22 ENCOUNTER — Ambulatory Visit: Attending: Orthopaedic Surgery | Admitting: Physical Therapy

## 2024-06-22 DIAGNOSIS — M25551 Pain in right hip: Secondary | ICD-10-CM | POA: Diagnosis present

## 2024-06-22 DIAGNOSIS — M5416 Radiculopathy, lumbar region: Secondary | ICD-10-CM | POA: Insufficient documentation

## 2024-06-22 DIAGNOSIS — M6281 Muscle weakness (generalized): Secondary | ICD-10-CM | POA: Insufficient documentation

## 2024-06-22 DIAGNOSIS — R2689 Other abnormalities of gait and mobility: Secondary | ICD-10-CM | POA: Diagnosis present

## 2024-06-22 DIAGNOSIS — R252 Cramp and spasm: Secondary | ICD-10-CM | POA: Insufficient documentation

## 2024-06-22 DIAGNOSIS — R2681 Unsteadiness on feet: Secondary | ICD-10-CM | POA: Insufficient documentation

## 2024-06-22 NOTE — Therapy (Signed)
 OUTPATIENT PHYSICAL THERAPY TREATMENT   Patient Name: [name] Trujillo MRN: [id number] DOB:24-Dec-1940, 83 y.o., female Today's Date: 06/22/2024  END OF SESSION:  PT End of Session - 06/22/24 1448     Visit Number 7    Date for Recertification  07/20/24    Authorization Type [employer]    Authorization Time Period 05/11/24 - 08/09/24    Authorization - Visit Number 7    Authorization - Number of Visits 10    Progress Note Due on Visit 10    PT Start Time 1448    PT Stop Time 1533    PT Time Calculation (min) 45 min    Activity Tolerance Patient tolerated treatment well    Behavior During Therapy [facility] for tasks assessed/performed               Past Medical History:  Diagnosis Date   High cholesterol    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   She had ACUTE TAKOTSUBO (STRESS) CARDIOMYOPATHY with elevated Troponin Levels - this would be considered Demand Ischemia - Demand Infarction & NOT associated with ACS/CAD.     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  - Cath with NO CAD. ==> RESOLVED: f/u Echo 03/2022: EF 60-65%.   Past Surgical History:  Procedure Laterality Date   APPEN[age]CTOMY     44-18 yo   BACK SURGERY     Age 33   CESAREAN SECTION     ECTOPIC PREGNANCY SURGERY     LAPAROSCOPIC HYSTERECTOMY     LEFT HEART CATH AND CORONARY ANGIOGRAPHY N/A 01/09/2022   Procedure: LEFT HEART CATH AND CORONARY ANGIOGRAPHY;  Surgeon: Wonda Sharper, MD;  Location: [facility] INVASIVE CV LAB;  Service: CV:: Widely patent coronaries with mild nonobstructive LAD plaquing.  Right dominant system.  Normal LVEDP.  Based on clinical presentation, findings are consistent with acute Takotsubo  Cardiomyopathy Syndrome   POSTERIOR FUSION LUMBAR SPINE  05/24/2022   [facility], [place]; [name] Lemmings, MD): L2-L3 XLIF, L2-L3 POSTERIOR DECOMPRESSION AND FUSION   TRANSTHORACIC ECHOCARDIOGRAM  01/08/2022   Severely decreased LV function-EF 25 to 30%.  Mid to apical (mostly anterior) with normal basal motion.  GR 2 DD-elevated LAP.  Mildly dilated LA.  Aortic sclerosis with no stenosis.  No AI.  Normal MV with mild to moderate TR.  Mildly elevated RAP, and PAP (estimated 49 mg). If LAD CAD ruled out - consistent with Takotsubo CM Syndrome.   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) 12/30/2023   New onset atrial fibrillation (HCC) 12/30/2023   Myopia of both eyes with astigmatism and presbyopia 05/06/2023   Dry eye syndrome of both eyes 05/06/2023   Mild tricuspid regurgitation 05/06/2022   Mild mitral regurgitation 05/06/2022   Hypertension 05/06/2022   History of seizure 05/06/2022   Takotsubo cardiomyopathy-resolved 01/10/2022   Hyperlipidemia  with target LDL less than 100 01/10/2022   Hypothyroid 01/10/2022   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) 01/08/2022   Numbness in both legs 04/10/2021   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   Posterior vitreous detachment of both eyes 02/05/2018   Ventricular premature beats 10/31/2015   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   Pure hypercholesterolemia 06/28/2014   Plantar fasciitis 06/28/2014    PCP: Windy Coy, MD   REFERRING PROVIDER: Vernetta Lonni GRADE, MD  REFERRING DIAG:  [phone] (ICD-10-CM) - Pain in right hip   M54.50 (ICD-10-CM) - Low back pain, unspecified  Eval and treat low back/right pelvis and right hip/ishium Any modalities per therapist   THERAPY DIAG:  Radiculopathy, lumbar region  Pain in right hip  Muscle weakness (generalized)  Cramp and spasm  Other abnormalities of gait and mobility  Unsteadiness on feet  RATIONALE FOR EVALUATION AND TREATMENT: Rehabilitation  ONSET DATE: Memorial Day weekend  NEXT MD VISIT: 07/21/2024 or 07/22/2024   SUBJECTIVE:                                                                                                                                                                                                         SUBJECTIVE STATEMENT: Pt returning to PT after 2 week absence due to cataract surgery.  She also notes that she was down with some illness during this time so she has not done much of her HEP in the past 2 weeks.  She notes an exacerbation of her pain at the time of her surgery, potentially as a result of how she was moving when getting on/off the procedure table during her cataract surgery, but no pain today.  She reports she is planning to start a magnesium supplement to help with muscle cramping and her daughter who is an MD suggested that she inquire with her doctor about possibly starting gabapentin for her nerve pain.  EVAL: [name] Trujillo is well know to me from several prior PT episodes for foot drop, cervical and lumbar radiculopathy.  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  Woke up the next morning with severe R hip/buttock pain.  Pain mostly localized to sciatic and ischial area of her R buttock.  PCP provider told her it was likely hip bursitis but ortho MD feels like it is more likely radicular from her back.  Some relief from steroid injection around her right hip ischial area provided by ortho MD but pain has  never fully gone away.   She does  have a significant lurch in her gait which is somewhat more pronounced than on prior PT episodes with this PT.  PAIN: Are you having pain? No and Yes: NPRS scale: 0/10  Pain location: R lower buttock, with radicular pain down lateral leg (ITB distrubution)  Pain description: constant   Aggravating factors: putting more weight on R LE  Relieving factors: Theraworx cream   PERTINENT HISTORY:  L2-3 XLIF/PSF 05/24/22; C5-7 ACDF 09/21/20; idiopathic progressive neuropathy; hypthyroidism; DDD; remote h/o back surgery at age 76; h/o scoliosis; Takotsubo cardiomyopathy 01/10/22; CAD; paroxysmal afib; mitral and tricuspid regurgitation; myalgia due to statin; recent cataract surgery  PRECAUTIONS: Fall  RED FLAGS: Bowel or bladder incontinence: Yes: chronic bladder issues, bowel urgency - somewhat worse than before   WEIGHT BEARING RESTRICTIONS: No  FALLS:  Has patient fallen in last 6 months? No  LIVING ENVIRONMENT: Lives with: lives with their spouse Lives in: House/apartment Stairs: Yes: External: 5-6 steps; on left going up Has following equipment at home: Walker - 2 wheeled, shower chair, and hiking/walking poles  OCCUPATION: Retired Runner, broadcasting/film/video  PLOF: Independent and Leisure: walking ~30 minutes 1x/day (3x around the court near where she lives); sewing/quilting; likes working in the yard    PATIENT GOALS: To start walking again w/o doing more damage.   OBJECTIVE: (objective measures completed at initial evaluation unless otherwise dated)  DIAGNOSTIC FINDINGS:  04/21/24 - XR RIGHT HIP: An AP pelvis and lateral of the right hip shows well-maintained joint space with no cortical irregularities around the hip or the trochanteric area of the hip.  The lower lumbar spine shows severe degenerative changes at L4-L5 and L5-S1 on the AP view.   08/27/22 - LUMBAR SPINE - COMPLETE 4+ VIEW FINDINGS: There is mild levoconvex curvature of the lumbar spine, unchanged. Alignment is anatomic. There are  bilateral transpedicular screws in posterior fusion rods present at L2-L3, new from prior. Disc spacers present. Alignment is anatomic. No fractures are identified. There is moderate severe disc space narrowing at L4-L5 and L5-S1 as well as L1-L2 similar to the prior study. There are atherosclerotic calcifications of the aorta.   IMPRESSION: 1. Status post posterior fusion at L2-L3. Alignment is anatomic. 2. Multilevel degenerative disc disease, similar to prior study.  PATIENT SURVEYS:  Modified Oswestry:  MODIFIED OSWESTRY DISABILITY SCALE  Date:  05/11/2024  Pain intensity 2 =  Pain medication provides me with complete relief from pain.  2. Personal care (washing, dressing, etc.) 1 =  I can take care of myself normally, but it increases my pain.  3. Lifting 4 = I can lift only very light weights  4. Walking 1 = Pain prevents me from walking more than 1 mile.  5. Sitting 1 =  I can only sit in my favorite chair as long as I like.  6. Standing 2 =  Pain prevents me from standing more than 1 hour  7. Sleeping 2 =  Even when I take pain medication, I sleep less than 6 hours  8. Social Life 2 = Pain prevents me from participating in more energetic activities (eg. sports, dancing).  9. Traveling 2 =  My pain restricts my travel over 2 hours.  10. Employment/ Homemaking 2 = I can perform most of my homemaking/job duties, but pain prevents me from performing more physically stressful activities (eg, lifting, vacuuming).  Total 17 / 50 = 34.0 %  Interpretation Moderate disability   Interpretation of scores: Score  Category Description  0-20% Minimal Disability The patient can cope with most living activities. Usually no treatment is indicated apart from advice on lifting, sitting and exercise  21-40% Moderate Disability The patient experiences more pain and difficulty with sitting, lifting and standing. Travel and social life are more difficult and they may be disabled from work. Personal care,  sexual activity and sleeping are not grossly affected, and the patient can usually be managed by conservative means  41-60% Severe Disability Pain remains the main problem in this group, but activities of daily living are affected. These patients require a detailed investigation  61-80% Crippled Back pain impinges on all aspects of the patient's life. Positive intervention is required  81-100% Bed-bound These patients are either bed-bound or exaggerating their symptoms  Bluford FORBES Zoe DELENA Karon DELENA, et al. Surgery versus conservative management of stable thoracolumbar fracture: the PRESTO feasibility RCT. [place]): [employer]; 2021 Nov. [facility] Technology Assessment, No. 25.62.) Appendix 3, Oswestry Disability Index category descriptors. Available from: [employer] Minimally Clinically Important Difference (MCID) = 12.8%  LEFS  Extreme difficulty/unable (0), Quite a bit of difficulty (1), Moderate difficulty (2), Little difficulty (3), No difficulty (4) Survey date:  05/11/24  Any of your usual work, housework or school activities 1  2. Usual hobbies, recreational or sporting activities 2  3. Getting into/out of the bath 2  4. Walking between rooms 2  5. Putting on socks/shoes 2  6. Squatting  0  7. Lifting an object, like a bag of groceries from the floor 2  8. Performing light activities around your home 2  9. Performing heavy activities around your home 0  10. Getting into/out of a car 2  11. Walking 2 blocks 0  12. Walking 1 mile 2  13. Going up/down 10 stairs (1 flight) 2  14. Standing for 1 hour 2  15.  sitting for 1 hour 3  16. Running on even ground 0  17. Running on uneven ground 0  18. Making sharp turns while running fast 0  19. Hopping  0  20. Rolling over in bed 2  Score total:   26 / 80 = 32.5 %  Functional limitation: Moderate     SCREENING FOR RED FLAGS: Bowel or bladder incontinence: Yes: chronic bladder issues,  bowel urgency - somewhat worse than before Spinal tumors: No Cauda equina syndrome: No Compression fracture: No Abdominal aneurysm: No  COGNITION:  Overall cognitive status: Within functional limits for tasks assessed    SENSATION: WFL Except B distal LE neuropathy  POSTURE:  rounded shoulders, forward head, right pelvic obliquity, and scoliosis  PALPATION: Increased muscle tension in L>R lumbar paraspinals and R>L glutes and piriformis with TTP over inferior glutes and piriformis near hamstring origin  LUMBAR ROM: *Lumbar ROM assessed with UE support for balance and close SBA/CGA of PT  Active  Eval  Flexion Hands to distal shins  Extension 50% limited  Right lateral flexion Hand to upper thigh - p!  Left lateral flexion Hand to lateral knee  Right rotation 50% limited  Left rotation 30% limited - p!  (Blank rows = not tested)  MUSCLE LENGTH: Hamstrings: mild/mod tight B ITB: mild tight B Piriformis: mod/severe tight B Hip IR: mod tight B Hip flexors: mild tight R Quads: mild tight R>L Heelcord: mild/mod tight B  LOWER EXTREMITY ROM:    Grossly WFL other than limitations indicated above due to muscle tightness   LOWER EXTREMITY MMT:    MMT Right  eval Left eval  Hip flexion 3+ 4-  Hip extension 4- 4  Hip abduction 3+ 4-  Hip adduction 3+ 4-  Hip internal rotation 4- 4-  Hip external rotation 3+ 3+  Knee flexion 4 4  Knee extension 4 4  Ankle dorsiflexion 3- 3-  Ankle plantarflexion    Ankle inversion    Ankle eversion     (Blank rows = not tested)  FUNCTIONAL TESTS: (Assessment completed 05/14/24 unless otherwise indicated) 5 times sit to stand: 12.37 sec w/o UE assist - 06/01/24 Timed up and go (TUG): 13.75 sec with single hiking pole 10 meter walk test: 16.00 sec with single hiking pole Gait speed: 2.05 ft/sec with single hiking pole Berg Balance Scale: 42/56; 37-45 significant (>80%) - 05/19/24 Functional gait assessment: 10/30, < 19 = high risk fall     Standardized testing results as of discharge from latest PT episode (11/11/23): 5xSTS = 11.84 sec  = 13.63 sec w/o AD Gait speed = 2.41 ft/sec w/o AD Berg = 45/56; scores of 37-45 indicate significant (>80%) fall risk  FGA = 19/30; scores of 19-24 indicate a medium risk for falls   GAIT: Distance walked: Clinic distances Assistive device utilized: Single walking/trekking pole on R Level of assistance: SBA Gait pattern: Increased sway, step through pattern, decreased stride length, trendelenburg, lateral hip instability, poor foot clearance- Right, and poor foot clearance- Left Comments: B foot slap   TODAY'S TREATMENT:   06/22/2024 THERAPEUTIC EXERCISE: To improve strength, endurance, and flexibility.  Demonstration, verbal and tactile cues throughout for technique.  Rec Bike - L3 x 6 min Seated 3-way thoracolumbar flexion stretch 2 x 30 each position using hiking pole for UE support  THERAPEUTIC ACTIVITIES: To improve functional performance.  Demonstration, verbal and tactile cues throughout for technique. Unsupported mini-squat hovering over mat table with arms forward for balance (chair placed in front for safety with intermittent support) x 10 - more frequent loss of eccentric control/uncontrolled descent onto mat table Unsupported mini-squat with glute touch touch to rounded bolster on mat table with arms forward for balance (chair placed in front for safety with intermittent support) x 10  NEUROMUSCULAR RE-EDUCATION: To improve balance, coordination, kinesthesia, posture, proprioception, and amplitude of movement.  S/L R hip ABD/ext diagonals 2 x 5 - L knee flexed for increased ant/post stability to prevent pt from rolling backward & PT guiding motion for 1st few reps; repeated x 10 with L LE  S/L R hip circumduction arc over L LE tapping R foot in front and behind L foot x 5 - very limited eccentric control with posterior tap; repeated x 10 with L  LE   06/08/2024 THERAPEUTIC EXERCISE: To improve strength, endurance, ROM, and flexibility.  Demonstration, verbal and tactile cues throughout for technique. Rec Bike - L3 x 6 min Seated 3-way thoracolumbar flexion stretch 2 x 30 each position - 1st reps with hands on green Pball, 2nd reps using hiking pole for UE support Quadruped 3-way child's pose x 30 each  THERAPEUTIC ACTIVITIES: To improve functional performance.  Demonstration, verbal and tactile cues throughout for technique. Reviewed proper lifting mechanics using squatting motion and golfer's lift  Unsupported mini-squat hovering over mat table with arms forward for balance (chair placed in front for safety but not needed for support) x 8  SELF CARE:  Provided education on managing muscle cramping through stretching, increased hydration, and potential Mg supplement (instructed pt to verify with PCP that there are no concerns or potential medicine interactions  with trying Mg supplement).   06/03/2024  THERAPEUTIC EXERCISE: To improve strength, endurance, and flexibility.  Demonstration, verbal and tactile cues throughout for technique.  Rec Bike - L3 x 6 min Standing lateral lean R ITB over back of chair 2 x 30 Supine ITB stretch with strap 3 x 30 bil  MANUAL THERAPY: To promote normalized muscle tension, improved flexibility, and reduced pain utilizing connective tissue massage, therapeutic massage, manual TP therapy, and myofascial release.  STM/DTM, IASTM with foam roller and manual TPR to R distal glutes, HS and ITB  NEUROMUSCULAR RE-EDUCATION: To improve coordination, kinesthesia, posture, proprioception, and amplitude of movement.  S/L R hip ABD/ext diagonals x 10 - slight PT assist initially to stabilize hips to prevent anterior/posterior rotation & VC/TC to avoid hip ER S/L R hip circumduction arc over L LE tapping R foot in front and behind L foot x 5 - very limited eccentric control with posterior tap   SELF CARE:   HEP update Provided instruction in self-STM techniques to R ITB using rolling pin.    06/01/2024 THERAPEUTIC EXERCISE: To improve strength and endurance.  Demonstration, verbal and tactile cues throughout for technique. NuStep - L4 x 6 min (LE only)  THERAPEUTIC ACTIVITIES: To improve functional performance.  Demonstration, verbal and tactile cues throughout for technique. 5xSTS = 12.37 sec w/o UE assist, somewhat uncontrolled descent esp on last rep  TRIGGER POINT DRY NEEDLING: Treatment instructions/education: Initial Treatment: Pt instructed on Dry Needling rational, procedures, and possible side effects. Pt instructed to expect mild to moderate muscle soreness later in the day and/or into the next day.  Pt instructed in methods to reduce muscle soreness. Pt instructed to continue prescribed HEP. Patient was educated on signs and symptoms of infection and other risk factors and advised to seek medical attention should they occur.  Patient verbalized understanding of these instructions and education.  Education Handout Provided: Yes Consent: Patient Verbal Consent Given: Yes Treatment: Muscles Treated: R medial and lateral piriformis, R glute medius Utilized skilled palpation to identify bony landmarks and trigger points along with monitoring of soft tissue during DN. Electrical Stimulation Performed: No Treatment Response/Outcome: Twitch response elicited, Palpable increase in muscle length, Decreased tissue resistance noted, Decreased pain/TTP, and Improved exercise tolerance  MANUAL THERAPY: To promote normalized muscle tension, improved flexibility, and reduced pain. STM/DTM, manual TPR and pin & stretch to muscles addressed with DN   NEUROMUSCULAR RE-EDUCATION: To improve balance, coordination, kinesthesia, posture, proprioception, and amplitude of movement. Mini-squat with counter support x 10   05/19/24 THERAPEUTIC EXERCISE: To improve ROM and flexibility.  Demonstration,  verbal and tactile cues throughout for technique.  Bike L3x69min Standing runner stretch x 30 BLE Standing soleus stretch x 30 BLE Table piriformis stretch 2x30 RLE Supine L figure 4 stretch 2x30 LLE Supine glute stretch 2x30 BLE Supine KTOS stretch 2x30 BLE  Seated clams with GTB + trA x 10 BLE Seated marching GTB x + TrA 10 BLE Berg Balance Test  Sit to Stand Able to stand without using hands and stabilize independently   Standing Unsupported Able to stand 2 minutes with supervision   Sitting with Back Unsupported but Feet Supported on Floor or Stool Able to sit safely and securely 2 minutes   Stand to Sit Sits safely with minimal use of hands   Transfers Able to transfer safely, minor use of hands   Standing Unsupported with Eyes Closed Able to stand 10 seconds with supervision   Standing Unsupported with Feet Together Able  to place feet together independently and stand 1 minute safely   From Standing, Reach Forward with Outstretched Arm Can reach confidently >25 cm (10)   From Standing Position, Pick up Object from Floor Able to pick up shoe safely and easily   From Standing Position, Turn to Look Behind Over each Shoulder Looks behind one side only/other side shows less weight shift   Turn 360 Degrees Able to turn 360 degrees safely but slowly   Standing Unsupported, Alternately Place Feet on Step/Stool Able to complete 4 steps without aid or supervision   Standing Unsupported, One Foot in Front Needs help to step but can hold 15 seconds   Standing on One Leg Unable to try or needs assist to prevent fall   Total Score 42       05/14/2024  THERAPEUTIC ACTIVITIES: To improve functional performance.  Demonstration, verbal and tactile cues throughout for technique.  5xSTS: Deferred due to increased pain with uncontrolled descent upon sitting TUG: 13.75 sec with single hiking pole : 16.00 sec with single hiking pole Gait speed: 2.05 ft/sec with single hiking  pole  PHYSICAL PERFORMANCE TEST or MEASUREMENT: Functional Gait  Assessment  Gait Level Surface Walks 20 ft, slow speed, abnormal gait pattern, evidence for imbalance or deviates 10-15 in outside of the 12 in walkway width. Requires more than 7 sec to ambulate 20 ft.   Change in Gait Speed Makes only minor adjustments to walking speed, or accomplishes a change in speed with significant gait deviations, deviates 10-15 in outside the 12 in walkway width, or changes speed but loses balance but is able to recover and continue walking.   Gait with Horizontal Head Turns Performs head turns with moderate changes in gait velocity, slows down, deviates 10-15 in outside 12 in walkway width but recovers, can continue to walk.   Gait with Vertical Head Turns Performs task with moderate change in gait velocity, slows down, deviates 10-15 in outside 12 in walkway width but recovers, can continue to walk.   Gait and Pivot Turn Turns slowly, requires verbal cueing, or requires several small steps to catch balance following turn and stop   Step Over Obstacle Is able to step over one shoe box (4.5 in total height) without changing gait speed. No evidence of imbalance.   Gait with Narrow Base of Support Ambulates less than 4 steps heel to toe or cannot perform without assistance.   Gait with Eyes Closed Cannot walk 20 ft without assistance, severe gait deviations or imbalance, deviates greater than 15 in outside 12 in walkway width or will not attempt task.   Ambulating Backwards Walks 20 ft, slow speed, abnormal gait pattern, evidence for imbalance, deviates 10-15 in outside 12 in walkway width.   Steps Alternating feet, must use rail.   Total Score 10   FGA comment: < 19 = high risk fall      THERAPEUTIC EXERCISE: To improve ROM and flexibility.  Demonstration, verbal and tactile cues throughout for technique.  Hooklying R HS stretch with strap hamstring 2 x 30 Supine gluteus stretch 2 x 30 bil Hooklying  figure-4 piriformis stretch x 30 on L, deferred d/t increased pain on R Hooklying KTOS piriformis stretch x 30 on L, deferred d/t increased pain on R Side-sitting R modified pigeon piriformis stretch 2 x 30 - good stretch noted without increased pain Seated R KTOS piriformis stretch 2 x 30 - good stretch noted without increased pain  SELF CARE:   Modified HEP to address  concerns regarding increased R buttock/hip pain with piriformis stretching, with patient reporting better tolerance for seated piriformis stretching.  Patient cautioned not to force any painful ROM/stretching and report back to PT next visit if HEP continues to irritate or cause increased pain. Provided instruction in self-STM techniques to R glutes/piriformis using tennis ball against wall or in chair.    05/04/2024  SELF CARE:  Reviewed eval findings and role of PT in addressing identified deficits as well as instruction in initial HEP (see below).    PATIENT EDUCATION:  Education details: HEP review  Person educated: Patient Education method: Solicitor, Verbal cues, and Tactile cues Education comprehension: verbalized understanding, returned demonstration, verbal cues required, tactile cues required, and needs further education  HOME EXERCISE PROGRAM: Access Code: [facility] URL: [email or web address] Date: 06/08/2024 Prepared by: Elijah Hidden  Exercises - Standing Soleus Stretch  - 1 x daily - 7 x weekly - 3 sets - 3 reps - 30 sec hold - Hooklying Hamstring Stretch with Strap  - 2 x daily - 7 x weekly - 3 reps - 30 sec hold - Supine Gluteus Stretch  - 2 x daily - 7 x weekly - 3 reps - 30 sec hold - Supine Figure 4 Piriformis Stretch  - 2 x daily - 7 x weekly - 3 reps - 30 sec hold - Supine Piriformis Stretch with Foot on Ground  - 2 x daily - 7 x weekly - 3 reps - 30 sec hold - Seated Table Piriformis Stretch  - 2 x daily - 7 x weekly - 3 reps - 30 sec hold - Seated Piriformis  Stretch  - 2 x daily - 7 x weekly - 3 reps - 30 sec hold - Seated March with Resistance  - 1 x daily - 7 x weekly - 2 sets - 10 reps - Seated Isometric Hip Abduction with Resistance  - 1 x daily - 7 x weekly - 2 sets - 10 reps - Glute Max Release With Campbell Soup Against Wall  - 1 x daily - 7 x weekly - 1-2 min hold - Mini Squat with Counter Support  - 1 x daily - 3 x weekly - 2 sets - 10 reps - 3-5 sec hold - Standing ITB Stretch (Mirrored)  - 2 x daily - 7 x weekly - 3 reps - 30 sec hold - Supine Iliotibial Band Stretch with Strap  - 2 x daily - 7 x weekly - 3 reps - 30 sec hold - Sidelying Diagonal Hip Abduction  - 1 x daily - 3 x weekly - 2 sets - 10 reps - 3 sec hold - Sidelying Hip Abduction and Extension (Mirrored)  - 1 x daily - 3 x weekly - 2 sets - 5 reps - 3 sec hold - Seated 3 Way Exercise Ball Roll Out Stretch  - 1 x daily - 7 x weekly - 2 sets - 3 reps - 30 sec hold - Mini Squat with Chair  - 1 x daily - 3 x weekly - 2 sets - 10 reps - 3 sec hold  Patient Education - Lifting Techniques   ASSESSMENT:  CLINICAL IMPRESSION: [name] Trujillo returns to PT after a 2-week absence due to cataract surgery and subsequent illness.  As a result she reports limited opportunity to work on her HEP over the past 2 weeks and requested a review of the most recent additions to her HEP today.  Cues necessary to remind her of proper positioning of her  hiking pole during 3-way lumbar flexion stretch, with patient able to perform good return demonstration and reporting good stretch with no increased pain.  Also reviewed recent progression of hip strengthening with increased difficulty/effort noted during side-lying exercises as well as increased LOB and limited eccentric control with unsupported mini squats.  [name] Trujillo will benefit from continued skilled PT to address ongoing flexibility, strength and balance deficits to improve mobility and activity tolerance with decreased pain interference and decreased risk for  falls.   EVAL: [name] Trujillo is a 83 y.o. female who was referred to physical therapy for evaluation and treatment for R hip/buttock pain presumed to be the result of lumbar radiculopathy.  Patient reports onset of R buttock pain beginning Memorial Day weekend 2025.  Pain is worse with walking and positional changes.  Patient has deficits in hip ROM, proximal LE flexibility, B LE strength, abnormal posture, and TTP with abnormal muscle tension in R lower glutes and piriformis which are interfering with ADLs and are impacting quality of life.  On Modified Oswestry patient scored 17/50 demonstrating 34% or moderate disability.  On LEFS patient scored 26/80 demonstrating moderate functional limitation.  Given her significant LE weakness and impaired gait pattern, will plan more formalized balance testing next visit.  [name] Trujillo will benefit from skilled PT to address above deficits to improve mobility and activity tolerance with decreased pain interference.     OBJECTIVE IMPAIRMENTS: Abnormal gait, decreased activity tolerance, decreased balance, decreased knowledge of condition, decreased mobility, difficulty walking, decreased ROM, decreased strength, increased fascial restrictions, impaired perceived functional ability, increased muscle spasms, impaired flexibility, impaired sensation, improper body mechanics, postural dysfunction, and pain.   ACTIVITY LIMITATIONS: carrying, lifting, bending, standing, squatting, sleeping, stairs, transfers, bed mobility, bathing, toileting, dressing, reach over head, hygiene/grooming, locomotion level, and caring for others  PARTICIPATION LIMITATIONS: meal prep, cleaning, laundry, driving, shopping, community activity, yard work, and church  PERSONAL FACTORS: Age, Past/current experiences, Time since onset of injury/illness/exacerbation, and 3+ comorbidities: L2-3 XLIF/PSF 05/24/22; C5-7 ACDF 09/21/20; idiopathic progressive neuropathy; hypthyroidism; DDD; remote h/o back  surgery at age 63; h/o scoliosis; 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   REHAB POTENTIAL: Good  CLINICAL DECISION MAKING: Unstable/unpredictable  EVALUATION COMPLEXITY: High   GOALS: Goals reviewed with patient? Yes  SHORT TERM GOALS: Target date: 06/15/2024  Patient will be independent with initial HEP to improve outcomes and carryover.  Baseline: Initial HEP provided on eval Goal status: MET - 05/19/24  2.  Patient will report 25% improvement in low back pain to improve QOL. Baseline: 2/10 on eval, up to 10/10 06/03/24 - improvement in pain noted by end of today's session Goal status: MET - 06/08/24 - Pt reports 50+% improvement in pain thus far  3.  Complete standardized balance assessment and update LTG's as indicated. Baseline:  05/14/24 - , TUG and FGA completed; Berg and 5xSTS pending  05/19/24 GLENWOOD Levins completed Goal status: MET - 06/01/24 - 5xSTS completed  LONG TERM GOALS: Target date: 07/20/2024  Patient will be independent with ongoing/advanced HEP for self-management at home.  Baseline:  Goal status: IN PROGRESS - 06/22/24 - HEP reviewed today  2.  Patient will report 50-75% improvement in R hip/pelvic pain to improve QOL.  Baseline: 2/10 on eval, up to 10/10 Goal status: IN PROGRESS - 06/08/24 - Pt reports 50+% improvement in pain thus far  3.  Patient will demonstrate improved B proximal  LE strength to >/= 4 to 4+/5 for improved stability and ease of mobility. Baseline: Refer to above LE MMT table Goal status: INITIAL  4.  Patient will report >/= 35/80 on LEFS (MCID = 9 pts) to demonstrate improved functional ability. Baseline: 26 / 80 = 32.5 % Goal status: INITIAL   5. Patient will report </= 22% on Modified Oswestry (MCID = 12%) to demonstrate improved functional ability with decreased pain  interference. Baseline: 17 / 50 = 34.0 % Goal status: INITIAL  6.  Patient will resume her normal walking routine w/o increased pain to allow for  improved mobility and activity tolerance. Baseline: R ischial pain limiting walking tolerance Goal status: INITIAL  7.  Patient will improve Berg score by at least 8 points or to >/= 45/56 (baseline as of prior D/C from PT) to improve safety and stability with ADLs in standing and reduce risk for falls.  Baseline: 42/56 (05/19/24) Goal status: INITIAL  8.  Patient will improve FGA score by at least 4 points or to >/= 19/30 (baseline as of prior D/C from PT) to improve gait stability and reduce risk for falls.   Baseline: 10/30 (05/14/24) Goal status: INITIAL   9.  Patient will improve gait velocity to at least 2.62 ft/sec for improved gait efficiency and safety with community ambulation.  Baseline: 2.05 ft/sec with single hiking pole (05/14/24) Goal status: INITIAL     PLAN:  PT FREQUENCY: 2x/week  PT DURATION: 10 weeks  PLANNED INTERVENTIONS: 97164- PT Re-evaluation, 97750- Physical Performance Testing, 97110-Therapeutic exercises, 97530- Therapeutic activity, 97112- Neuromuscular re-education, 97535- Self Care, 02859- Manual therapy, [phone]- Gait training, [phone]- Electrical stimulation (unattended), 97035- Ultrasound, 02966- Ionotophoresis 4mg /ml Dexamethasone, 79439 (1-2 muscles), 20561 (3+ muscles)- Dry Needling, Patient/Family education, Balance training, Stair training, Taping, Joint mobilization, DME instructions, Cryotherapy, and Moist heat  PLAN FOR NEXT SESSION: Provide handout for proper lifting mechanics; stretch progression as indicated; Progress gentle lumbopelvic/proximal LE strengthening as pain allows; MT +/- TPDN to address abnormal muscle tension in glutes and piriformis   Elijah CHRISTELLA Hidden, PT 06/22/2024, 7:04 PM

## 2024-06-24 ENCOUNTER — Ambulatory Visit: Admitting: Physical Therapy

## 2024-06-24 ENCOUNTER — Encounter: Payer: Self-pay | Admitting: Physical Therapy

## 2024-06-24 DIAGNOSIS — M6281 Muscle weakness (generalized): Secondary | ICD-10-CM

## 2024-06-24 DIAGNOSIS — R252 Cramp and spasm: Secondary | ICD-10-CM

## 2024-06-24 DIAGNOSIS — R2689 Other abnormalities of gait and mobility: Secondary | ICD-10-CM

## 2024-06-24 DIAGNOSIS — M5416 Radiculopathy, lumbar region: Secondary | ICD-10-CM | POA: Diagnosis not present

## 2024-06-24 DIAGNOSIS — R2681 Unsteadiness on feet: Secondary | ICD-10-CM

## 2024-06-24 DIAGNOSIS — M25551 Pain in right hip: Secondary | ICD-10-CM

## 2024-06-24 NOTE — Therapy (Signed)
 OUTPATIENT PHYSICAL THERAPY TREATMENT   Patient Name: [name] Trujillo MRN: [id number] DOB:1941/05/08, 83 y.o., female Today's Date: 06/24/2024  END OF SESSION:  PT End of Session - 06/24/24 1448     Visit Number 8    Date for Recertification  07/20/24    Authorization Type [employer]    Authorization Time Period 05/11/24 - 08/09/24    Authorization - Visit Number 8    Authorization - Number of Visits 10    Progress Note Due on Visit 10    PT Start Time 1448    PT Stop Time 1530    PT Time Calculation (min) 42 min    Activity Tolerance Patient tolerated treatment well    Behavior During Therapy [facility] for tasks assessed/performed                Past Medical History:  Diagnosis Date   High cholesterol    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   She had ACUTE TAKOTSUBO (STRESS) CARDIOMYOPATHY with elevated Troponin Levels - this would be considered Demand Ischemia - Demand Infarction & NOT associated with ACS/CAD.     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  - Cath with NO CAD. ==> RESOLVED: f/u Echo 03/2022: EF 60-65%.   Past Surgical History:  Procedure Laterality Date   [age]CTOMY     78-20 yo   BACK SURGERY     Age 18   CESAREAN SECTION     ECTOPIC PREGNANCY SURGERY     LAPAROSCOPIC HYSTERECTOMY     LEFT HEART CATH AND CORONARY ANGIOGRAPHY N/A 01/09/2022   Procedure: LEFT HEART CATH AND CORONARY ANGIOGRAPHY;  Surgeon: Wonda Sharper, MD;  Location: [facility] INVASIVE CV LAB;  Service: CV:: Widely patent coronaries with mild nonobstructive LAD plaquing.  Right dominant system.  Normal LVEDP.  Based on clinical presentation, findings are consistent with acute Takotsubo  Cardiomyopathy Syndrome   POSTERIOR FUSION LUMBAR SPINE  05/24/2022   [facility], [place]; [name], MD): L2-L3 XLIF, L2-L3 POSTERIOR DECOMPRESSION AND FUSION   TRANSTHORACIC ECHOCARDIOGRAM  01/08/2022   Severely decreased LV function-EF 25 to 30%.  Mid to apical (mostly anterior) with normal basal motion.  GR 2 DD-elevated LAP.  Mildly dilated LA.  Aortic sclerosis with no stenosis.  No AI.  Normal MV with mild to moderate TR.  Mildly elevated RAP, and PAP (estimated 49 mg). If LAD CAD ruled out - consistent with Takotsubo CM Syndrome.   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) 12/30/2023   New onset atrial fibrillation (HCC) 12/30/2023   Myopia of both eyes with astigmatism and presbyopia 05/06/2023   Dry eye syndrome of both eyes 05/06/2023   Mild tricuspid regurgitation 05/06/2022   Mild mitral regurgitation 05/06/2022   Hypertension 05/06/2022   History of seizure 05/06/2022   Takotsubo cardiomyopathy-resolved 01/10/2022  Hyperlipidemia with target LDL less than 100 01/10/2022   Hypothyroid 01/10/2022   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) 01/08/2022   Numbness in both legs 04/10/2021   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   Posterior vitreous detachment of both eyes 02/05/2018   Ventricular premature beats 10/31/2015   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   Pure hypercholesterolemia 06/28/2014   Plantar fasciitis 06/28/2014    PCP: Windy Coy, MD   REFERRING PROVIDER: Vernetta Lonni GRADE, MD  REFERRING DIAG:  [phone] (ICD-10-CM) - Pain in right hip   M54.50 (ICD-10-CM) - Low back pain, unspecified  Eval and treat low back/right pelvis and right hip/ishium Any modalities per therapist   THERAPY DIAG:  Radiculopathy, lumbar region  Pain in right hip  Muscle weakness (generalized)  Cramp and spasm  Other abnormalities of gait and mobility  Unsteadiness on feet  RATIONALE FOR EVALUATION AND TREATMENT: Rehabilitation  ONSET DATE: Memorial Day weekend  NEXT MD VISIT: 07/21/2024 or 07/22/2024   SUBJECTIVE:                                                                                                                                                                                                         SUBJECTIVE STATEMENT: Pt reporting she feels more wobbly today.  She requests review of seated 3-way lumbar stretch, specifically placement of pole and angle of movement for lateral stretches.  Pt returning to PT after 2 week absence due to cataract surgery.  She also notes that she was down with some illness during this time so she has not done much of her HEP in the past 2 weeks.  She notes an exacerbation of her pain at the time of her surgery, potentially as a result of how she was moving when getting on/off the procedure table during her cataract surgery, but no pain today.  She reports she is planning to start a magnesium supplement to help with muscle cramping and her daughter who is an MD suggested that she inquire with her doctor about possibly starting gabapentin for her nerve pain.  EVAL: [name] Trujillo is well know to me from several prior PT episodes for foot drop, cervical and lumbar radiculopathy.  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  Woke up the next morning with severe R hip/buttock pain.  Pain mostly localized to sciatic and ischial area of her R buttock.  PCP provider told her it was likely hip bursitis but ortho MD  feels like it is  more likely radicular from her back.  Some relief from steroid injection around her right hip ischial area provided by ortho MD but pain has never fully gone away.   She does have a significant lurch in her gait which is somewhat more pronounced than on prior PT episodes with this PT.  PAIN: Are you having pain? No and Yes: NPRS scale: 0.5/10  Pain location: R lower buttock  Pain description: constant   Aggravating factors: putting more weight on R LE  Relieving factors: Theraworx cream   PERTINENT HISTORY:  L2-3 XLIF/PSF 05/24/22; C5-7 ACDF 09/21/20; idiopathic progressive neuropathy; hypthyroidism; DDD; remote h/o back surgery at age 63; h/o scoliosis; Takotsubo cardiomyopathy 01/10/22; CAD; paroxysmal afib; mitral and tricuspid regurgitation; myalgia due to statin; recent cataract surgery  PRECAUTIONS: Fall  RED FLAGS: Bowel or bladder incontinence: Yes: chronic bladder issues, bowel urgency - somewhat worse than before   WEIGHT BEARING RESTRICTIONS: No  FALLS:  Has patient fallen in last 6 months? No  LIVING ENVIRONMENT: Lives with: lives with their spouse Lives in: House/apartment Stairs: Yes: External: 5-6 steps; on left going up Has following equipment at home: Walker - 2 wheeled, shower chair, and hiking/walking poles  OCCUPATION: Retired Runner, broadcasting/film/video  PLOF: Independent and Leisure: walking ~30 minutes 1x/day (3x around the court near where she lives); sewing/quilting; likes working in the yard    PATIENT GOALS: To start walking again w/o doing more damage.   OBJECTIVE: (objective measures completed at initial evaluation unless otherwise dated)  DIAGNOSTIC FINDINGS:  04/21/24 - XR RIGHT HIP: An AP pelvis and lateral of the right hip shows well-maintained joint space with no cortical irregularities around the hip or the trochanteric area of the hip.  The lower lumbar spine shows severe degenerative changes at L4-L5 and L5-S1 on the AP view.   08/27/22 - LUMBAR SPINE - COMPLETE  4+ VIEW FINDINGS: There is mild levoconvex curvature of the lumbar spine, unchanged. Alignment is anatomic. There are bilateral transpedicular screws in posterior fusion rods present at L2-L3, new from prior. Disc spacers present. Alignment is anatomic. No fractures are identified. There is moderate severe disc space narrowing at L4-L5 and L5-S1 as well as L1-L2 similar to the prior study. There are atherosclerotic calcifications of the aorta.   IMPRESSION: 1. Status post posterior fusion at L2-L3. Alignment is anatomic. 2. Multilevel degenerative disc disease, similar to prior study.  PATIENT SURVEYS:  Modified Oswestry:  MODIFIED OSWESTRY DISABILITY SCALE  Date:  05/11/2024  Pain intensity 2 =  Pain medication provides me with complete relief from pain.  2. Personal care (washing, dressing, etc.) 1 =  I can take care of myself normally, but it increases my pain.  3. Lifting 4 = I can lift only very light weights  4. Walking 1 = Pain prevents me from walking more than 1 mile.  5. Sitting 1 =  I can only sit in my favorite chair as long as I like.  6. Standing 2 =  Pain prevents me from standing more than 1 hour  7. Sleeping 2 =  Even when I take pain medication, I sleep less than 6 hours  8. Social Life 2 = Pain prevents me from participating in more energetic activities (eg. sports, dancing).  9. Traveling 2 =  My pain restricts my travel over 2 hours.  10. Employment/ Homemaking 2 = I can perform most of my homemaking/job duties, but pain prevents me from performing more physically stressful activities (  eg, lifting, vacuuming).  Total 17 / 50 = 34.0 %  Interpretation Moderate disability   Interpretation of scores: Score Category Description  0-20% Minimal Disability The patient can cope with most living activities. Usually no treatment is indicated apart from advice on lifting, sitting and exercise  21-40% Moderate Disability The patient experiences more pain and difficulty with sitting,  lifting and standing. Travel and social life are more difficult and they may be disabled from work. Personal care, sexual activity and sleeping are not grossly affected, and the patient can usually be managed by conservative means  41-60% Severe Disability Pain remains the main problem in this group, but activities of daily living are affected. These patients require a detailed investigation  61-80% Crippled Back pain impinges on all aspects of the patient's life. Positive intervention is required  81-100% Bed-bound These patients are either bed-bound or exaggerating their symptoms  Bluford FORBES Zoe DELENA Karon DELENA, et al. Surgery versus conservative management of stable thoracolumbar fracture: the PRESTO feasibility RCT. [place] ([place]): [employer]; 2021 Nov. [facility] Technology Assessment, No. 25.62.) Appendix 3, Oswestry Disability Index category descriptors. Available from: [employer]cz Minimally Clinically Important Difference (MCID) = 12.8%  LEFS  Extreme difficulty/unable (0), Quite a bit of difficulty (1), Moderate difficulty (2), Little difficulty (3), No difficulty (4) Survey date:  05/11/24  Any of your usual work, housework or school activities 1  2. Usual hobbies, recreational or sporting activities 2  3. Getting into/out of the bath 2  4. Walking between rooms 2  5. Putting on socks/shoes 2  6. Squatting  0  7. Lifting an object, like a bag of groceries from the floor 2  8. Performing light activities around your home 2  9. Performing heavy activities around your home 0  10. Getting into/out of a car 2  11. Walking 2 blocks 0  12. Walking 1 mile 2  13. Going up/down 10 stairs (1 flight) 2  14. Standing for 1 hour 2  15.  sitting for 1 hour 3  16. Running on even ground 0  17. Running on uneven ground 0  18. Making sharp turns while running fast 0  19. Hopping  0  20. Rolling over in bed 2  Score total:   26 / 80 = 32.5 %  Functional  limitation: Moderate     SCREENING FOR RED FLAGS: Bowel or bladder incontinence: Yes: chronic bladder issues, bowel urgency - somewhat worse than before Spinal tumors: No Cauda equina syndrome: No Compression fracture: No Abdominal aneurysm: No  COGNITION:  Overall cognitive status: Within functional limits for tasks assessed    SENSATION: WFL Except B distal LE neuropathy  POSTURE:  rounded shoulders, forward head, right pelvic obliquity, and scoliosis  PALPATION: Increased muscle tension in L>R lumbar paraspinals and R>L glutes and piriformis with TTP over inferior glutes and piriformis near hamstring origin  LUMBAR ROM: *Lumbar ROM assessed with UE support for balance and close SBA/CGA of PT  Active  Eval  Flexion Hands to distal shins  Extension 50% limited  Right lateral flexion Hand to upper thigh - p!  Left lateral flexion Hand to lateral knee  Right rotation 50% limited  Left rotation 30% limited - p!  (Blank rows = not tested)  MUSCLE LENGTH: Hamstrings: mild/mod tight B ITB: mild tight B Piriformis: mod/severe tight B Hip IR: mod tight B Hip flexors: mild tight R Quads: mild tight R>L Heelcord: mild/mod tight B  LOWER EXTREMITY ROM:  Grossly WFL other than limitations indicated above due to muscle tightness   LOWER EXTREMITY MMT:    MMT Right eval Left eval  Hip flexion 3+ 4-  Hip extension 4- 4  Hip abduction 3+ 4-  Hip adduction 3+ 4-  Hip internal rotation 4- 4-  Hip external rotation 3+ 3+  Knee flexion 4 4  Knee extension 4 4  Ankle dorsiflexion 3- 3-  Ankle plantarflexion    Ankle inversion    Ankle eversion     (Blank rows = not tested)  FUNCTIONAL TESTS: (Assessment completed 05/14/24 unless otherwise indicated) 5 times sit to stand: 12.37 sec w/o UE assist - 06/01/24 Timed up and go (TUG): 13.75 sec with single hiking pole 10 meter walk test: 16.00 sec with single hiking pole Gait speed: 2.05 ft/sec with single hiking pole Berg  Balance Scale: 42/56; 37-45 significant (>80%) - 05/19/24 Functional gait assessment: 10/30, < 19 = high risk fall    Standardized testing results as of discharge from latest PT episode (11/11/23): 5xSTS = 11.84 sec  = 13.63 sec w/o AD Gait speed = 2.41 ft/sec w/o AD Berg = 45/56; scores of 37-45 indicate significant (>80%) fall risk  FGA = 19/30; scores of 19-24 indicate a medium risk for falls   GAIT: Distance walked: Clinic distances Assistive device utilized: Single walking/trekking pole on R Level of assistance: SBA Gait pattern: Increased sway, step through pattern, decreased stride length, trendelenburg, lateral hip instability, poor foot clearance- Right, and poor foot clearance- Left Comments: B foot slap   TODAY'S TREATMENT:   06/24/2024 THERAPEUTIC EXERCISE: To improve strength, endurance, ROM, and flexibility.  Demonstration, verbal and tactile cues throughout for technique.  Rec Bike - L3 x 6 min Seated 3-way thoracolumbar flexion stretch 2 x 30 each position using hiking pole for UE support, clarifying angle for lateral stretches Seated B foot/ankle arch raises x 10 Seated toe curls/towel scrunches - patient with difficulty achieving grip of towel with toes therefore suggested patient try using a small object like a pen or pencil to work on grip with toes at home  NEUROMUSCULAR RE-EDUCATION: To improve coordination, kinesthesia, and reduce nerve irritation. Seated sciatic nerve glides - difficulty coordinating movement  Supine sciatic nerve glides 2 x 10 bil Supine sciatic nerve glides with 90/90 knee flexion x 10 bil - patient reporting better response to previous exercise  THERAPEUTIC ACTIVITIES: To improve functional performance.  Demonstration, verbal and tactile cues throughout for technique. Unsupported mini-squat hovering over mat table with arms forward for balance (chair placed in front for safety with intermittent support) x 10 - attempted in bare feet  today as patient feeling like she is losing proprioceptive awareness and control in her feet, however still demonstrating frequent loss of eccentric control/uncontrolled descent onto mat table   06/22/2024 THERAPEUTIC EXERCISE: To improve strength, endurance, and flexibility.  Demonstration, verbal and tactile cues throughout for technique.  Rec Bike - L3 x 6 min Seated 3-way thoracolumbar flexion stretch 2 x 30 each position using hiking pole for UE support  THERAPEUTIC ACTIVITIES: To improve functional performance.  Demonstration, verbal and tactile cues throughout for technique. Unsupported mini-squat hovering over mat table with arms forward for balance (chair placed in front for safety with intermittent support) x 10 - more frequent loss of eccentric control/uncontrolled descent onto mat table Unsupported mini-squat with glute touch touch to rounded bolster on mat table with arms forward for balance (chair placed in front for safety with intermittent support) x 10  NEUROMUSCULAR RE-EDUCATION: To improve balance, coordination, kinesthesia, posture, proprioception, and amplitude of movement.  S/L R hip ABD/ext diagonals 2 x 5 - L knee flexed for increased ant/post stability to prevent pt from rolling backward & PT guiding motion for 1st few reps; repeated x 10 with L LE  S/L R hip circumduction arc over L LE tapping R foot in front and behind L foot x 5 - very limited eccentric control with posterior tap; repeated x 10 with L LE   06/08/2024 THERAPEUTIC EXERCISE: To improve strength, endurance, ROM, and flexibility.  Demonstration, verbal and tactile cues throughout for technique. Rec Bike - L3 x 6 min Seated 3-way thoracolumbar flexion stretch 2 x 30 each position - 1st reps with hands on green Pball, 2nd reps using hiking pole for UE support Quadruped 3-way child's pose x 30 each  THERAPEUTIC ACTIVITIES: To improve functional performance.  Demonstration, verbal and tactile cues  throughout for technique. Reviewed proper lifting mechanics using squatting motion and golfer's lift  Unsupported mini-squat hovering over mat table with arms forward for balance (chair placed in front for safety but not needed for support) x 8  SELF CARE:  Provided education on managing muscle cramping through stretching, increased hydration, and potential Mg supplement (instructed pt to verify with PCP that there are no concerns or potential medicine interactions with trying Mg supplement).   06/03/2024  THERAPEUTIC EXERCISE: To improve strength, endurance, and flexibility.  Demonstration, verbal and tactile cues throughout for technique.  Rec Bike - L3 x 6 min Standing lateral lean R ITB over back of chair 2 x 30 Supine ITB stretch with strap 3 x 30 bil  MANUAL THERAPY: To promote normalized muscle tension, improved flexibility, and reduced pain utilizing connective tissue massage, therapeutic massage, manual TP therapy, and myofascial release.  STM/DTM, IASTM with foam roller and manual TPR to R distal glutes, HS and ITB  NEUROMUSCULAR RE-EDUCATION: To improve coordination, kinesthesia, posture, proprioception, and amplitude of movement.  S/L R hip ABD/ext diagonals x 10 - slight PT assist initially to stabilize hips to prevent anterior/posterior rotation & VC/TC to avoid hip ER S/L R hip circumduction arc over L LE tapping R foot in front and behind L foot x 5 - very limited eccentric control with posterior tap   SELF CARE:  HEP update Provided instruction in self-STM techniques to R ITB using rolling pin.    06/01/2024 THERAPEUTIC EXERCISE: To improve strength and endurance.  Demonstration, verbal and tactile cues throughout for technique. NuStep - L4 x 6 min (LE only)  THERAPEUTIC ACTIVITIES: To improve functional performance.  Demonstration, verbal and tactile cues throughout for technique. 5xSTS = 12.37 sec w/o UE assist, somewhat uncontrolled descent esp on last  rep  TRIGGER POINT DRY NEEDLING: Treatment instructions/education: Initial Treatment: Pt instructed on Dry Needling rational, procedures, and possible side effects. Pt instructed to expect mild to moderate muscle soreness later in the day and/or into the next day.  Pt instructed in methods to reduce muscle soreness. Pt instructed to continue prescribed HEP. Patient was educated on signs and symptoms of infection and other risk factors and advised to seek medical attention should they occur.  Patient verbalized understanding of these instructions and education.  Education Handout Provided: Yes Consent: Patient Verbal Consent Given: Yes Treatment: Muscles Treated: R medial and lateral piriformis, R glute medius Utilized skilled palpation to identify bony landmarks and trigger points along with monitoring of soft tissue during DN. Electrical Stimulation Performed: No Treatment Response/Outcome: Twitch response  elicited, Palpable increase in muscle length, Decreased tissue resistance noted, Decreased pain/TTP, and Improved exercise tolerance  MANUAL THERAPY: To promote normalized muscle tension, improved flexibility, and reduced pain. STM/DTM, manual TPR and pin & stretch to muscles addressed with DN   NEUROMUSCULAR RE-EDUCATION: To improve balance, coordination, kinesthesia, posture, proprioception, and amplitude of movement. Mini-squat with counter support x 10   05/19/24 THERAPEUTIC EXERCISE: To improve ROM and flexibility.  Demonstration, verbal and tactile cues throughout for technique.  Bike L3x65min Standing runner stretch x 30 BLE Standing soleus stretch x 30 BLE Table piriformis stretch 2x30 RLE Supine L figure 4 stretch 2x30 LLE Supine glute stretch 2x30 BLE Supine KTOS stretch 2x30 BLE  Seated clams with GTB + trA x 10 BLE Seated marching GTB x + TrA 10 BLE Berg Balance Test  Sit to Stand Able to stand without using hands and stabilize independently   Standing  Unsupported Able to stand 2 minutes with supervision   Sitting with Back Unsupported but Feet Supported on Floor or Stool Able to sit safely and securely 2 minutes   Stand to Sit Sits safely with minimal use of hands   Transfers Able to transfer safely, minor use of hands   Standing Unsupported with Eyes Closed Able to stand 10 seconds with supervision   Standing Unsupported with Feet Together Able to place feet together independently and stand 1 minute safely   From Standing, Reach Forward with Outstretched Arm Can reach confidently >25 cm (10)   From Standing Position, Pick up Object from Floor Able to pick up shoe safely and easily   From Standing Position, Turn to Look Behind Over each Shoulder Looks behind one side only/other side shows less weight shift   Turn 360 Degrees Able to turn 360 degrees safely but slowly   Standing Unsupported, Alternately Place Feet on Step/Stool Able to complete 4 steps without aid or supervision   Standing Unsupported, One Foot in Front Needs help to step but can hold 15 seconds   Standing on One Leg Unable to try or needs assist to prevent fall   Total Score 42       05/14/2024  THERAPEUTIC ACTIVITIES: To improve functional performance.  Demonstration, verbal and tactile cues throughout for technique.  5xSTS: Deferred due to increased pain with uncontrolled descent upon sitting TUG: 13.75 sec with single hiking pole : 16.00 sec with single hiking pole Gait speed: 2.05 ft/sec with single hiking pole  PHYSICAL PERFORMANCE TEST or MEASUREMENT: Functional Gait  Assessment  Gait Level Surface Walks 20 ft, slow speed, abnormal gait pattern, evidence for imbalance or deviates 10-15 in outside of the 12 in walkway width. Requires more than 7 sec to ambulate 20 ft.   Change in Gait Speed Makes only minor adjustments to walking speed, or accomplishes a change in speed with significant gait deviations, deviates 10-15 in outside the 12 in walkway width, or  changes speed but loses balance but is able to recover and continue walking.   Gait with Horizontal Head Turns Performs head turns with moderate changes in gait velocity, slows down, deviates 10-15 in outside 12 in walkway width but recovers, can continue to walk.   Gait with Vertical Head Turns Performs task with moderate change in gait velocity, slows down, deviates 10-15 in outside 12 in walkway width but recovers, can continue to walk.   Gait and Pivot Turn Turns slowly, requires verbal cueing, or requires several small steps to catch balance following turn and stop  Step Over Obstacle Is able to step over one shoe box (4.5 in total height) without changing gait speed. No evidence of imbalance.   Gait with Narrow Base of Support Ambulates less than 4 steps heel to toe or cannot perform without assistance.   Gait with Eyes Closed Cannot walk 20 ft without assistance, severe gait deviations or imbalance, deviates greater than 15 in outside 12 in walkway width or will not attempt task.   Ambulating Backwards Walks 20 ft, slow speed, abnormal gait pattern, evidence for imbalance, deviates 10-15 in outside 12 in walkway width.   Steps Alternating feet, must use rail.   Total Score 10   FGA comment: < 19 = high risk fall      THERAPEUTIC EXERCISE: To improve ROM and flexibility.  Demonstration, verbal and tactile cues throughout for technique.  Hooklying R HS stretch with strap hamstring 2 x 30 Supine gluteus stretch 2 x 30 bil Hooklying figure-4 piriformis stretch x 30 on L, deferred d/t increased pain on R Hooklying KTOS piriformis stretch x 30 on L, deferred d/t increased pain on R Side-sitting R modified pigeon piriformis stretch 2 x 30 - good stretch noted without increased pain Seated R KTOS piriformis stretch 2 x 30 - good stretch noted without increased pain  SELF CARE:   Modified HEP to address concerns regarding increased R buttock/hip pain with piriformis stretching, with  patient reporting better tolerance for seated piriformis stretching.  Patient cautioned not to force any painful ROM/stretching and report back to PT next visit if HEP continues to irritate or cause increased pain. Provided instruction in self-STM techniques to R glutes/piriformis using tennis ball against wall or in chair.    05/04/2024  SELF CARE:  Reviewed eval findings and role of PT in addressing identified deficits as well as instruction in initial HEP (see below).    PATIENT EDUCATION:  Education details: HEP review, postural awareness, posture and body mechanics for typical daily postioning, mobility and household tasks, and Architectural technologist  Person educated: Patient Education method: Explanation, Demonstration, Verbal cues, Tactile cues, and Handouts Education comprehension: verbalized understanding, returned demonstration, verbal cues required, tactile cues required, and needs further education  HOME EXERCISE PROGRAM: Access Code: [facility] URL: [email or web address] Date: 06/24/2024 Prepared by: Elijah Hidden  Exercises - Standing Soleus Stretch  - 1 x daily - 7 x weekly - 3 sets - 3 reps - 30 sec hold - Hooklying Hamstring Stretch with Strap  - 2 x daily - 7 x weekly - 3 reps - 30 sec hold - Supine Gluteus Stretch  - 2 x daily - 7 x weekly - 3 reps - 30 sec hold - Supine Figure 4 Piriformis Stretch  - 2 x daily - 7 x weekly - 3 reps - 30 sec hold - Supine Piriformis Stretch with Foot on Ground  - 2 x daily - 7 x weekly - 3 reps - 30 sec hold - Seated Table Piriformis Stretch  - 2 x daily - 7 x weekly - 3 reps - 30 sec hold - Seated Piriformis Stretch  - 2 x daily - 7 x weekly - 3 reps - 30 sec hold - Seated March with Resistance  - 1 x daily - 7 x weekly - 2 sets - 10 reps - Seated Isometric Hip Abduction with Resistance  - 1 x daily - 7 x weekly - 2 sets - 10 reps - Glute Max Release With Campbell Soup Against Wall  - 1 x daily -  7 x weekly - 1-2 min hold -  Mini Squat with Counter Support  - 1 x daily - 3 x weekly - 2 sets - 10 reps - 3-5 sec hold - Standing ITB Stretch (Mirrored)  - 2 x daily - 7 x weekly - 3 reps - 30 sec hold - Supine Iliotibial Band Stretch with Strap  - 2 x daily - 7 x weekly - 3 reps - 30 sec hold - Sidelying Diagonal Hip Abduction  - 1 x daily - 3 x weekly - 2 sets - 10 reps - 3 sec hold - Sidelying Hip Abduction and Extension (Mirrored)  - 1 x daily - 3 x weekly - 2 sets - 5 reps - 3 sec hold - Seated 3 Way Exercise Ball Roll Out Stretch  - 1 x daily - 7 x weekly - 2 sets - 3 reps - 30 sec hold - Mini Squat with Chair  - 1 x daily - 3 x weekly - 2 sets - 10 reps - 3 sec hold - Supine Sciatic Nerve Glide  - 1 x daily - 7 x weekly - 2 sets - 10 reps - 3 sec hold  Patient Education - Lifting Techniques   ASSESSMENT:  CLINICAL IMPRESSION: [name] Trujillo requested review of seated three-way lumbar flexion stretch to clarify angle and positioning of pole during lateral stretches.  She continues to experience mild R glute pain, although decrease R ITB pain but still has intermittent radicular neuro symptoms.  Introduced sciatic nerve glide with patient noting best response during supine version.  She notes increased unsteadiness today and questions whether some of her recent increased difficulty with eccentric stand to sit type of activities might be due to worsening of motor control in her feet as she notes she has lost the ability to grip things with her toes.  Reviewed exercises from prior PT episodes to promote increased foot intrinsic muscle control with patient no longer able to grasp towels with her toes as she used to be able to.  Kayla will benefit from continued skilled PT to address ongoing flexibility, strength and balance deficits to improve mobility and activity tolerance with decreased pain interference and decreased risk for falls.   EVAL: [name] Trujillo is a 83 y.o. female who was referred to physical therapy for evaluation  and treatment for R hip/buttock pain presumed to be the result of lumbar radiculopathy.  Patient reports onset of R buttock pain beginning Memorial Day weekend 2025.  Pain is worse with walking and positional changes.  Patient has deficits in hip ROM, proximal LE flexibility, B LE strength, abnormal posture, and TTP with abnormal muscle tension in R lower glutes and piriformis which are interfering with ADLs and are impacting quality of life.  On Modified Oswestry patient scored 17/50 demonstrating 34% or moderate disability.  On LEFS patient scored 26/80 demonstrating moderate functional limitation.  Given her significant LE weakness and impaired gait pattern, will plan more formalized balance testing next visit.  Charli will benefit from skilled PT to address above deficits to improve mobility and activity tolerance with decreased pain interference.     OBJECTIVE IMPAIRMENTS: Abnormal gait, decreased activity tolerance, decreased balance, decreased knowledge of condition, decreased mobility, difficulty walking, decreased ROM, decreased strength, increased fascial restrictions, impaired perceived functional ability, increased muscle spasms, impaired flexibility, impaired sensation, improper body mechanics, postural dysfunction, and pain.   ACTIVITY LIMITATIONS: carrying, lifting, bending, standing, squatting, sleeping, stairs, transfers, bed mobility, bathing, toileting, dressing, reach over head, hygiene/grooming,  locomotion level, and caring for others  PARTICIPATION LIMITATIONS: meal prep, cleaning, laundry, driving, shopping, community activity, yard work, and church  PERSONAL FACTORS: Age, Past/current experiences, Time since onset of injury/illness/exacerbation, and 3+ comorbidities: L2-3 XLIF/PSF 05/24/22; C5-7 ACDF 09/21/20; idiopathic progressive neuropathy; hypthyroidism; DDD; remote h/o back surgery at age 55; h/o scoliosis; 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   REHAB POTENTIAL: Good  CLINICAL DECISION MAKING: Unstable/unpredictable  EVALUATION COMPLEXITY: High   GOALS: Goals reviewed with patient? Yes  SHORT TERM GOALS: Target date: 06/15/2024  Patient will be independent with initial HEP to improve outcomes and carryover.  Baseline: Initial HEP provided on eval Goal status: MET - 05/19/24  2.  Patient will report 25% improvement in low back pain to improve QOL. Baseline: 2/10 on eval, up to 10/10 06/03/24 - improvement in pain noted by end of today's session Goal status: MET - 06/08/24 - Pt reports 50+% improvement in pain thus far  3.  Complete standardized balance assessment and update LTG's as indicated. Baseline:  05/14/24 - , TUG and FGA completed; Berg and 5xSTS pending  05/19/24 GLENWOOD Levins completed Goal status: MET - 06/01/24 - 5xSTS completed  LONG TERM GOALS: Target date: 07/20/2024  Patient will be independent with ongoing/advanced HEP for self-management at home.  Baseline:  Goal status: IN PROGRESS - 06/22/24 - HEP reviewed today  2.  Patient will report 50-75% improvement in R hip/pelvic pain to improve QOL.  Baseline: 2/10 on eval, up to 10/10 Goal status: IN PROGRESS - 06/08/24 - Pt reports 50+% improvement in pain thus far  3.  Patient will demonstrate improved B proximal LE strength to >/= 4 to 4+/5 for improved stability and ease of mobility. Baseline: Refer to above LE MMT table Goal status: INITIAL  4.  Patient will report >/= 35/80 on LEFS (MCID = 9 pts) to demonstrate improved functional ability. Baseline: 26 / 80 = 32.5 % Goal status: INITIAL   5. Patient will report </= 22% on Modified Oswestry (MCID = 12%) to demonstrate improved functional ability with decreased pain interference. Baseline: 17 / 50 = 34.0 % Goal status: INITIAL  6.  Patient will resume her normal walking routine w/o  increased pain to allow for  improved mobility and activity tolerance. Baseline: R ischial pain limiting walking tolerance Goal status: INITIAL  7.  Patient will improve Berg score by at least 8 points or to >/= 45/56 (baseline as of prior D/C from PT) to improve safety and stability with ADLs in standing and reduce risk for falls.  Baseline: 42/56 (05/19/24) Goal status: INITIAL  8.  Patient will improve FGA score by at least 4 points or to >/= 19/30 (baseline as of prior D/C from PT) to improve gait stability and reduce risk for falls.   Baseline: 10/30 (05/14/24) Goal status: INITIAL   9.  Patient will improve gait velocity to at least 2.62 ft/sec for improved gait efficiency and safety with community ambulation.  Baseline: 2.05 ft/sec with single hiking pole (05/14/24) Goal status: INITIAL     PLAN:  PT FREQUENCY: 2x/week  PT DURATION: 10 weeks  PLANNED INTERVENTIONS: 97164- PT Re-evaluation, 97750- Physical Performance Testing, 97110-Therapeutic exercises, 97530- Therapeutic activity, [id number]- Neuromuscular re-education, 97535- Self Care, [id number]- Manual therapy, [id number]- Gait training, [phone]- Electrical stimulation (unattended), [id number]- Ultrasound, 02966- Ionotophoresis 4mg /ml Dexamethasone, 79439 (1-2 muscles), 20561 (3+ muscles)- Dry Needling, Patient/Family education, Balance training,  Stair training, Taping, Joint mobilization, DME instructions, Cryotherapy, and Moist heat  PLAN FOR NEXT SESSION: Initiate LTG assessment for upcoming 10th visit progress note and [employer] Medicare reauthorization; stretch progression as indicated; Progress gentle lumbopelvic/proximal LE strengthening as pain allows; MT +/- TPDN to address abnormal muscle tension in glutes and piriformis   Elijah CHRISTELLA Hidden, PT 06/24/2024, 5:46 PM

## 2024-06-29 ENCOUNTER — Ambulatory Visit: Admitting: Physical Therapy

## 2024-07-01 ENCOUNTER — Encounter: Payer: Self-pay | Admitting: Physical Therapy

## 2024-07-01 ENCOUNTER — Ambulatory Visit: Admitting: Physical Therapy

## 2024-07-01 DIAGNOSIS — M5416 Radiculopathy, lumbar region: Secondary | ICD-10-CM | POA: Diagnosis not present

## 2024-07-01 DIAGNOSIS — R252 Cramp and spasm: Secondary | ICD-10-CM

## 2024-07-01 DIAGNOSIS — R2681 Unsteadiness on feet: Secondary | ICD-10-CM

## 2024-07-01 DIAGNOSIS — M25551 Pain in right hip: Secondary | ICD-10-CM

## 2024-07-01 DIAGNOSIS — M6281 Muscle weakness (generalized): Secondary | ICD-10-CM

## 2024-07-01 DIAGNOSIS — R2689 Other abnormalities of gait and mobility: Secondary | ICD-10-CM

## 2024-07-01 NOTE — Therapy (Signed)
 OUTPATIENT PHYSICAL THERAPY TREATMENT   Patient Name: [name] Trujillo MRN: [id number] DOB:[birth year], 83 y.o., female Today's Date: 07/01/2024  END OF SESSION:  PT End of Session - 07/01/24 1406     Visit Number 9    Date for Recertification  07/20/24    Authorization Type [employer]    Authorization Time Period 05/11/24 - 08/09/24    Authorization - Visit Number 9    Authorization - Number of Visits 10    Progress Note Due on Visit 10    PT Start Time 1406   Pt arrived late   PT Stop Time 1500    PT Time Calculation (min) 54 min    Activity Tolerance Patient tolerated treatment well    Behavior During Therapy [facility] for tasks assessed/performed                 Past Medical History:  Diagnosis Date   High cholesterol    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   She had ACUTE TAKOTSUBO (STRESS) CARDIOMYOPATHY with elevated Troponin Levels - this would be considered Demand Ischemia - Demand Infarction & NOT associated with ACS/CAD.     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  - Cath with NO CAD. ==> RESOLVED: f/u Echo 03/2022: EF 60-65%.   Past Surgical History:  Procedure Laterality Date   APPENDECTOMY     57-18 yo   BACK SURGERY     Age 14   CESAREAN SECTION     ECTOPIC PREGNANCY SURGERY     LAPAROSCOPIC HYSTERECTOMY     LEFT HEART CATH AND CORONARY ANGIOGRAPHY N/A 01/09/2022   Procedure: LEFT HEART CATH AND CORONARY ANGIOGRAPHY;  Surgeon: Wonda Sharper, MD;  Location: [facility] INVASIVE CV LAB;  Service: CV:: Widely patent coronaries with mild nonobstructive LAD plaquing.  Right dominant system.  Normal LVEDP.  Based on clinical presentation, findings are consistent  with acute Takotsubo Cardiomyopathy Syndrome   POSTERIOR FUSION LUMBAR SPINE  05/24/2022   [facility], [place]; [name], MD): L2-L3 XLIF, L2-L3 POSTERIOR DECOMPRESSION AND FUSION   TRANSTHORACIC ECHOCARDIOGRAM  01/08/2022   Severely decreased LV function-EF 25 to 30%.  Mid to apical (mostly anterior) with normal basal motion.  GR 2 DD-elevated LAP.  Mildly dilated LA.  Aortic sclerosis with no stenosis.  No AI.  Normal MV with mild to moderate TR.  Mildly elevated RAP, and PAP (estimated 49 mg). If LAD CAD ruled out - consistent with Takotsubo CM Syndrome.   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) 12/30/2023   New onset atrial fibrillation (HCC) 12/30/2023   Myopia of both eyes with astigmatism and presbyopia 05/06/2023   Dry eye syndrome of both eyes 05/06/2023   Mild tricuspid regurgitation 05/06/2022   Mild mitral regurgitation 05/06/2022   Hypertension 05/06/2022   History of seizure 05/06/2022  Takotsubo cardiomyopathy-resolved 01/10/2022   Hyperlipidemia with target LDL less than 100 01/10/2022   Hypothyroid 01/10/2022   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) 01/08/2022   Numbness in both legs 04/10/2021   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   Posterior vitreous detachment of both eyes 02/05/2018   Ventricular premature beats 10/31/2015   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   Pure hypercholesterolemia 06/28/2014   Plantar fasciitis 06/28/2014    PCP: Windy Coy, MD   REFERRING PROVIDER: Vernetta Lonni GRADE, MD  REFERRING DIAG:  [phone] (ICD-10-CM) -  Pain in right hip  M54.50 (ICD-10-CM) - Low back pain, unspecified  Eval and treat low back/right pelvis and right hip/ishium Any modalities per therapist   THERAPY DIAG:  Radiculopathy, lumbar region  Pain in right hip  Muscle weakness (generalized)  Cramp and spasm  Other abnormalities of gait and mobility  Unsteadiness on feet  RATIONALE FOR EVALUATION AND TREATMENT: Rehabilitation  ONSET DATE: Memorial Day weekend  NEXT MD VISIT: 07/21/2024 or 07/22/2024   SUBJECTIVE:                                                                                                                                                                                                         SUBJECTIVE STATEMENT: Pt reports return of her R buttock pain 2 days ago when she tried to walk up and inclined court in her neighborhood and states the pain has persisted since. She has not tried any of her exercises since the flare-up.  EVAL: Bobbette is well know to me from several prior PT episodes for foot drop, cervical and lumbar radiculopathy.  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  Woke up the next morning with severe R hip/buttock pain.  Pain mostly localized to sciatic and ischial area of her R buttock.  PCP provider told her it was likely hip bursitis but ortho MD feels like it is more likely radicular from her back.  Some relief from steroid injection around her right hip ischial area provided by ortho MD but pain has never fully gone away.   She does have a significant lurch in her gait which is somewhat more pronounced than on prior PT episodes with this PT.  PAIN: Are you having pain? No and Yes: NPRS scale: 6/10  Pain location: R lower buttock  Pain description: constant   Aggravating factors: walking, certain sitting positions, bending over to tie her shoes  Relieving factors: Theraworx  cream   PERTINENT  HISTORY:  L2-3 XLIF/PSF 05/24/22; C5-7 ACDF 09/21/20; idiopathic progressive neuropathy; hypthyroidism; DDD; remote h/o back surgery at age 29; h/o scoliosis; Takotsubo cardiomyopathy 01/10/22; CAD; paroxysmal afib; mitral and tricuspid regurgitation; myalgia due to statin; recent cataract surgery  PRECAUTIONS: Fall  RED FLAGS: Bowel or bladder incontinence: Yes: chronic bladder issues, bowel urgency - somewhat worse than before   WEIGHT BEARING RESTRICTIONS: No  FALLS:  Has patient fallen in last 6 months? No  LIVING ENVIRONMENT: Lives with: lives with their spouse Lives in: House/apartment Stairs: Yes: External: 5-6 steps; on left going up Has following equipment at home: Walker - 2 wheeled, shower chair, and hiking/walking poles  OCCUPATION: Retired Runner, broadcasting/film/video  PLOF: Independent and Leisure: walking ~30 minutes 1x/day (3x around the court near where she lives); sewing/quilting; likes working in the yard    PATIENT GOALS: To start walking again w/o doing more damage.   OBJECTIVE: (objective measures completed at initial evaluation unless otherwise dated)  DIAGNOSTIC FINDINGS:  04/21/24 - XR RIGHT HIP: An AP pelvis and lateral of the right hip shows well-maintained joint space with no cortical irregularities around the hip or the trochanteric area of the hip.  The lower lumbar spine shows severe degenerative changes at L4-L5 and L5-S1 on the AP view.   08/27/22 - LUMBAR SPINE - COMPLETE 4+ VIEW FINDINGS: There is mild levoconvex curvature of the lumbar spine, unchanged. Alignment is anatomic. There are bilateral transpedicular screws in posterior fusion rods present at L2-L3, new from prior. Disc spacers present. Alignment is anatomic. No fractures are identified. There is moderate severe disc space narrowing at L4-L5 and L5-S1 as well as L1-L2 similar to the prior study. There are atherosclerotic calcifications of the aorta.   IMPRESSION: 1. Status post posterior fusion at L2-L3.  Alignment is anatomic. 2. Multilevel degenerative disc disease, similar to prior study.  PATIENT SURVEYS:  Modified Oswestry:  MODIFIED OSWESTRY DISABILITY SCALE  Date:  05/11/2024  Pain intensity 2 =  Pain medication provides me with complete relief from pain.  2. Personal care (washing, dressing, etc.) 1 =  I can take care of myself normally, but it increases my pain.  3. Lifting 4 = I can lift only very light weights  4. Walking 1 = Pain prevents me from walking more than 1 mile.  5. Sitting 1 =  I can only sit in my favorite chair as long as I like.  6. Standing 2 =  Pain prevents me from standing more than 1 hour  7. Sleeping 2 =  Even when I take pain medication, I sleep less than 6 hours  8. Social Life 2 = Pain prevents me from participating in more energetic activities (eg. sports, dancing).  9. Traveling 2 =  My pain restricts my travel over 2 hours.  10. Employment/ Homemaking 2 = I can perform most of my homemaking/job duties, but pain prevents me from performing more physically stressful activities (eg, lifting, vacuuming).  Total 17 / 50 = 34.0 %  Interpretation Moderate disability   Interpretation of scores: Score Category Description  0-20% Minimal Disability The patient can cope with most living activities. Usually no treatment is indicated apart from advice on lifting, sitting and exercise  21-40% Moderate Disability The patient experiences more pain and difficulty with sitting, lifting and standing. Travel and social life are more difficult and they may be disabled from work. Personal care, sexual activity and sleeping are not grossly affected, and the patient can usually  be managed by conservative means  41-60% Severe Disability Pain remains the main problem in this group, but activities of daily living are affected. These patients require a detailed investigation  61-80% Crippled Back pain impinges on all aspects of the patient's life. Positive intervention is required   81-100% Bed-bound These patients are either bed-bound or exaggerating their symptoms  Bluford FORBES Zoe DELENA Karon DELENA, et al. Surgery versus conservative management of stable thoracolumbar fracture: the PRESTO feasibility RCT. [place]): [employer]; 2021 Nov. [facility] Technology Assessment, No. 25.62.) Appendix 3, Oswestry Disability Index category descriptors. Available from: [employer]cz Minimally Clinically Important Difference (MCID) = 12.8%  LEFS  Extreme difficulty/unable (0), Quite a bit of difficulty (1), Moderate difficulty (2), Little difficulty (3), No difficulty (4) Survey date:  05/11/24  Any of your usual work, housework or school activities 1  2. Usual hobbies, recreational or sporting activities 2  3. Getting into/out of the bath 2  4. Walking between rooms 2  5. Putting on socks/shoes 2  6. Squatting  0  7. Lifting an object, like a bag of groceries from the floor 2  8. Performing light activities around your home 2  9. Performing heavy activities around your home 0  10. Getting into/out of a car 2  11. Walking 2 blocks 0  12. Walking 1 mile 2  13. Going up/down 10 stairs (1 flight) 2  14. Standing for 1 hour 2  15.  sitting for 1 hour 3  16. Running on even ground 0  17. Running on uneven ground 0  18. Making sharp turns while running fast 0  19. Hopping  0  20. Rolling over in bed 2  Score total:   26 / 80 = 32.5 %  Functional limitation: Moderate     SCREENING FOR RED FLAGS: Bowel or bladder incontinence: Yes: chronic bladder issues, bowel urgency - somewhat worse than before Spinal tumors: No Cauda equina syndrome: No Compression fracture: No Abdominal aneurysm: No  COGNITION:  Overall cognitive status: Within functional limits for tasks assessed    SENSATION: WFL Except B distal LE neuropathy  POSTURE:  rounded shoulders, forward head, right pelvic obliquity, and scoliosis  PALPATION: Increased  muscle tension in L>R lumbar paraspinals and R>L glutes and piriformis with TTP over inferior glutes and piriformis near hamstring origin  LUMBAR ROM: *Lumbar ROM assessed with UE support for balance and close SBA/CGA of PT  Active  Eval  Flexion Hands to distal shins  Extension 50% limited  Right lateral flexion Hand to upper thigh - p!  Left lateral flexion Hand to lateral knee  Right rotation 50% limited  Left rotation 30% limited - p!  (Blank rows = not tested)  MUSCLE LENGTH: Hamstrings: mild/mod tight B ITB: mild tight B Piriformis: mod/severe tight B Hip IR: mod tight B Hip flexors: mild tight R Quads: mild tight R>L Heelcord: mild/mod tight B  LOWER EXTREMITY ROM:    Grossly WFL other than limitations indicated above due to muscle tightness   LOWER EXTREMITY MMT:    MMT Right eval Left eval  Hip flexion 3+ 4-  Hip extension 4- 4  Hip abduction 3+ 4-  Hip adduction 3+ 4-  Hip internal rotation 4- 4-  Hip external rotation 3+ 3+  Knee flexion 4 4  Knee extension 4 4  Ankle dorsiflexion 3- 3-  Ankle plantarflexion    Ankle inversion    Ankle eversion     (Blank rows = not tested)  FUNCTIONAL TESTS: (Assessment completed 05/14/24 unless otherwise indicated) 5 times sit to stand: 12.37 sec w/o UE assist - 06/01/24 Timed up and go (TUG): 13.75 sec with single hiking pole 10 meter walk test: 16.00 sec with single hiking pole Gait speed: 2.05 ft/sec with single hiking pole Berg Balance Scale: 42/56; 37-45 significant (>80%) - 05/19/24 Functional gait assessment: 10/30, < 19 = high risk fall    Standardized testing results as of discharge from latest PT episode (11/11/23): 5xSTS = 11.84 sec  = 13.63 sec w/o AD Gait speed = 2.41 ft/sec w/o AD Berg = 45/56; scores of 37-45 indicate significant (>80%) fall risk  FGA = 19/30; scores of 19-24 indicate a medium risk for falls   GAIT: Distance walked: Clinic distances Assistive device utilized: Single  walking/trekking pole on R Level of assistance: SBA Gait pattern: Increased sway, step through pattern, decreased stride length, trendelenburg, lateral hip instability, poor foot clearance- Right, and poor foot clearance- Left Comments: B foot slap   TODAY'S TREATMENT:   07/01/2024  THERAPEUTIC EXERCISE: To improve strength, endurance, ROM, and flexibility.  Demonstration, verbal and tactile cues throughout for technique.  Rec Bike - L3 x 6 min Seated 3-way thoracolumbar flexion stretch 2 x 30 each position, using hiking pole for UE support Supine R sciatic nerve glides 2 x 10 bil Hooklying R figure-4 piriformis stretch x 30 Hooklying R KTOS piriformis stretch x 30 - pt noting increased groin anterior thigh discomfort Mod thomas R quad/hip flexor stretch 2 x 30 - 1 rep each w/o and with strap Supine R HS stretch with strap x 30 Supine R ITB stretch with strap x 30   MANUAL THERAPY: To promote normalized muscle tension, improved flexibility, improved joint mobility, and reduced pain utilizing connective tissue massage, therapeutic massage, manual TP therapy, and myofascial release.  STM/DTM and manual TPR to R lower glutes and piriformis   SELF CARE:  Provided education on self-STM/muscle release options and need to break up her travel to [place] with frequent stops to allow her to stretch and walk around.   MODALITIES: (non-billable) Moist heat pack to R glutes x 10' at end of session   06/24/2024 THERAPEUTIC EXERCISE: To improve strength, endurance, ROM, and flexibility.  Demonstration, verbal and tactile cues throughout for technique.  Rec Bike - L3 x 6 min Seated 3-way thoracolumbar flexion stretch 2 x 30 each position, using hiking pole for UE support, clarifying angle for lateral stretches Seated B foot/ankle arch raises x 10 Seated toe curls/towel scrunches - patient with difficulty achieving grip of towel with toes therefore suggested patient try using a small  object like a pen or pencil to work on grip with toes at home  NEUROMUSCULAR RE-EDUCATION: To improve coordination, kinesthesia, and reduce nerve irritation. Seated sciatic nerve glides - difficulty coordinating movement  Supine sciatic nerve glides 2 x 10 bil Supine sciatic nerve glides with 90/90 knee flexion x 10 bil - patient reporting better response to previous exercise  THERAPEUTIC ACTIVITIES: To improve functional performance.  Demonstration, verbal and tactile cues throughout for technique. Unsupported mini-squat hovering over mat table with arms forward for balance (chair placed in front for safety with intermittent support) x 10 - attempted in bare feet today as patient feeling like she is losing proprioceptive awareness and control in her feet, however still demonstrating frequent loss of eccentric control/uncontrolled descent onto mat table   06/22/2024 THERAPEUTIC EXERCISE: To improve strength, endurance, and flexibility.  Demonstration, verbal and tactile cues  throughout for technique.  Rec Bike - L3 x 6 min Seated 3-way thoracolumbar flexion stretch 2 x 30 each position using hiking pole for UE support  THERAPEUTIC ACTIVITIES: To improve functional performance.  Demonstration, verbal and tactile cues throughout for technique. Unsupported mini-squat hovering over mat table with arms forward for balance (chair placed in front for safety with intermittent support) x 10 - more frequent loss of eccentric control/uncontrolled descent onto mat table Unsupported mini-squat with glute touch touch to rounded bolster on mat table with arms forward for balance (chair placed in front for safety with intermittent support) x 10  NEUROMUSCULAR RE-EDUCATION: To improve balance, coordination, kinesthesia, posture, proprioception, and amplitude of movement.  S/L R hip ABD/ext diagonals 2 x 5 - L knee flexed for increased ant/post stability to prevent pt from rolling backward & PT guiding motion  for 1st few reps; repeated x 10 with L LE  S/L R hip circumduction arc over L LE tapping R foot in front and behind L foot x 5 - very limited eccentric control with posterior tap; repeated x 10 with L LE   06/08/2024 THERAPEUTIC EXERCISE: To improve strength, endurance, ROM, and flexibility.  Demonstration, verbal and tactile cues throughout for technique. Rec Bike - L3 x 6 min Seated 3-way thoracolumbar flexion stretch 2 x 30 each position - 1st reps with hands on green Pball, 2nd reps using hiking pole for UE support Quadruped 3-way child's pose x 30 each  THERAPEUTIC ACTIVITIES: To improve functional performance.  Demonstration, verbal and tactile cues throughout for technique. Reviewed proper lifting mechanics using squatting motion and golfer's lift  Unsupported mini-squat hovering over mat table with arms forward for balance (chair placed in front for safety but not needed for support) x 8  SELF CARE:  Provided education on managing muscle cramping through stretching, increased hydration, and potential Mg supplement (instructed pt to verify with PCP that there are no concerns or potential medicine interactions with trying Mg supplement).   06/03/2024  THERAPEUTIC EXERCISE: To improve strength, endurance, and flexibility.  Demonstration, verbal and tactile cues throughout for technique.  Rec Bike - L3 x 6 min Standing lateral lean R ITB over back of chair 2 x 30 Supine ITB stretch with strap 3 x 30 bil  MANUAL THERAPY: To promote normalized muscle tension, improved flexibility, and reduced pain utilizing connective tissue massage, therapeutic massage, manual TP therapy, and myofascial release.  STM/DTM, IASTM with foam roller and manual TPR to R distal glutes, HS and ITB  NEUROMUSCULAR RE-EDUCATION: To improve coordination, kinesthesia, posture, proprioception, and amplitude of movement.  S/L R hip ABD/ext diagonals x 10 - slight PT assist initially to stabilize hips to prevent  anterior/posterior rotation & VC/TC to avoid hip ER S/L R hip circumduction arc over L LE tapping R foot in front and behind L foot x 5 - very limited eccentric control with posterior tap   SELF CARE:  HEP update Provided instruction in self-STM techniques to R ITB using rolling pin.    06/01/2024 THERAPEUTIC EXERCISE: To improve strength and endurance.  Demonstration, verbal and tactile cues throughout for technique. NuStep - L4 x 6 min (LE only)  THERAPEUTIC ACTIVITIES: To improve functional performance.  Demonstration, verbal and tactile cues throughout for technique. 5xSTS = 12.37 sec w/o UE assist, somewhat uncontrolled descent esp on last rep  TRIGGER POINT DRY NEEDLING: Treatment instructions/education: Initial Treatment: Pt instructed on Dry Needling rational, procedures, and possible side effects. Pt instructed to expect mild to moderate  muscle soreness later in the day and/or into the next day.  Pt instructed in methods to reduce muscle soreness. Pt instructed to continue prescribed HEP. Patient was educated on signs and symptoms of infection and other risk factors and advised to seek medical attention should they occur.  Patient verbalized understanding of these instructions and education.  Education Handout Provided: Yes Consent: Patient Verbal Consent Given: Yes Treatment: Muscles Treated: R medial and lateral piriformis, R glute medius Utilized skilled palpation to identify bony landmarks and trigger points along with monitoring of soft tissue during DN. Electrical Stimulation Performed: No Treatment Response/Outcome: Twitch response elicited, Palpable increase in muscle length, Decreased tissue resistance noted, Decreased pain/TTP, and Improved exercise tolerance  MANUAL THERAPY: To promote normalized muscle tension, improved flexibility, and reduced pain. STM/DTM, manual TPR and pin & stretch to muscles addressed with DN   NEUROMUSCULAR RE-EDUCATION: To improve  balance, coordination, kinesthesia, posture, proprioception, and amplitude of movement. Mini-squat with counter support x 10   05/19/24 THERAPEUTIC EXERCISE: To improve ROM and flexibility.  Demonstration, verbal and tactile cues throughout for technique.  Bike L3x64min Standing runner stretch x 30 BLE Standing soleus stretch x 30 BLE Table piriformis stretch 2x30 RLE Supine L figure 4 stretch 2x30 LLE Supine glute stretch 2x30 BLE Supine KTOS stretch 2x30 BLE  Seated clams with GTB + trA x 10 BLE Seated marching GTB x + TrA 10 BLE Berg Balance Test  Sit to Stand Able to stand without using hands and stabilize independently   Standing Unsupported Able to stand 2 minutes with supervision   Sitting with Back Unsupported but Feet Supported on Floor or Stool Able to sit safely and securely 2 minutes   Stand to Sit Sits safely with minimal use of hands   Transfers Able to transfer safely, minor use of hands   Standing Unsupported with Eyes Closed Able to stand 10 seconds with supervision   Standing Unsupported with Feet Together Able to place feet together independently and stand 1 minute safely   From Standing, Reach Forward with Outstretched Arm Can reach confidently >25 cm (10)   From Standing Position, Pick up Object from Floor Able to pick up shoe safely and easily   From Standing Position, Turn to Look Behind Over each Shoulder Looks behind one side only/other side shows less weight shift   Turn 360 Degrees Able to turn 360 degrees safely but slowly   Standing Unsupported, Alternately Place Feet on Step/Stool Able to complete 4 steps without aid or supervision   Standing Unsupported, One Foot in Front Needs help to step but can hold 15 seconds   Standing on One Leg Unable to try or needs assist to prevent fall   Total Score 42       05/14/2024  THERAPEUTIC ACTIVITIES: To improve functional performance.  Demonstration, verbal and tactile cues throughout for technique.  5xSTS:  Deferred due to increased pain with uncontrolled descent upon sitting TUG: 13.75 sec with single hiking pole : 16.00 sec with single hiking pole Gait speed: 2.05 ft/sec with single hiking pole  PHYSICAL PERFORMANCE TEST or MEASUREMENT: Functional Gait  Assessment  Gait Level Surface Walks 20 ft, slow speed, abnormal gait pattern, evidence for imbalance or deviates 10-15 in outside of the 12 in walkway width. Requires more than 7 sec to ambulate 20 ft.   Change in Gait Speed Makes only minor adjustments to walking speed, or accomplishes a change in speed with significant gait deviations, deviates 10-15 in outside the 12  in walkway width, or changes speed but loses balance but is able to recover and continue walking.   Gait with Horizontal Head Turns Performs head turns with moderate changes in gait velocity, slows down, deviates 10-15 in outside 12 in walkway width but recovers, can continue to walk.   Gait with Vertical Head Turns Performs task with moderate change in gait velocity, slows down, deviates 10-15 in outside 12 in walkway width but recovers, can continue to walk.   Gait and Pivot Turn Turns slowly, requires verbal cueing, or requires several small steps to catch balance following turn and stop   Step Over Obstacle Is able to step over one shoe box (4.5 in total height) without changing gait speed. No evidence of imbalance.   Gait with Narrow Base of Support Ambulates less than 4 steps heel to toe or cannot perform without assistance.   Gait with Eyes Closed Cannot walk 20 ft without assistance, severe gait deviations or imbalance, deviates greater than 15 in outside 12 in walkway width or will not attempt task.   Ambulating Backwards Walks 20 ft, slow speed, abnormal gait pattern, evidence for imbalance, deviates 10-15 in outside 12 in walkway width.   Steps Alternating feet, must use rail.   Total Score 10   FGA comment: < 19 = high risk fall      THERAPEUTIC EXERCISE: To  improve ROM and flexibility.  Demonstration, verbal and tactile cues throughout for technique.  Hooklying R HS stretch with strap hamstring 2 x 30 Supine gluteus stretch 2 x 30 bil Hooklying figure-4 piriformis stretch x 30 on L, deferred d/t increased pain on R Hooklying KTOS piriformis stretch x 30 on L, deferred d/t increased pain on R Side-sitting R modified pigeon piriformis stretch 2 x 30 - good stretch noted without increased pain Seated R KTOS piriformis stretch 2 x 30 - good stretch noted without increased pain  SELF CARE:   Modified HEP to address concerns regarding increased R buttock/hip pain with piriformis stretching, with patient reporting better tolerance for seated piriformis stretching.  Patient cautioned not to force any painful ROM/stretching and report back to PT next visit if HEP continues to irritate or cause increased pain. Provided instruction in self-STM techniques to R glutes/piriformis using tennis ball against wall or in chair.    05/04/2024  SELF CARE:  Reviewed eval findings and role of PT in addressing identified deficits as well as instruction in initial HEP (see below).    PATIENT EDUCATION:  Education details: self-STM techniques to glutes/piriformis using tennis ball on wall or in sitting  Person educated: Patient Education method: Explanation, Demonstration, and Verbal cues Education comprehension: verbalized understanding  HOME EXERCISE PROGRAM: Access Code: [facility] URL: [email or web address] Date: 06/24/2024 Prepared by: Elijah Hidden  Exercises - Standing Soleus Stretch  - 1 x daily - 7 x weekly - 3 sets - 3 reps - 30 sec hold - Hooklying Hamstring Stretch with Strap  - 2 x daily - 7 x weekly - 3 reps - 30 sec hold - Supine Gluteus Stretch  - 2 x daily - 7 x weekly - 3 reps - 30 sec hold - Supine Figure 4 Piriformis Stretch  - 2 x daily - 7 x weekly - 3 reps - 30 sec hold - Supine Piriformis Stretch with Foot on Ground  - 2  x daily - 7 x weekly - 3 reps - 30 sec hold - Seated Table Piriformis Stretch  - 2 x daily - 7 x weekly -  3 reps - 30 sec hold - Seated Piriformis Stretch  - 2 x daily - 7 x weekly - 3 reps - 30 sec hold - Seated March with Resistance  - 1 x daily - 7 x weekly - 2 sets - 10 reps - Seated Isometric Hip Abduction with Resistance  - 1 x daily - 7 x weekly - 2 sets - 10 reps - Glute Max Release With [employer] Against Wall  - 1 x daily - 7 x weekly - 1-2 min hold - Mini Squat with Counter Support  - 1 x daily - 3 x weekly - 2 sets - 10 reps - 3-5 sec hold - Standing ITB Stretch (Mirrored)  - 2 x daily - 7 x weekly - 3 reps - 30 sec hold - Supine Iliotibial Band Stretch with Strap  - 2 x daily - 7 x weekly - 3 reps - 30 sec hold - Sidelying Diagonal Hip Abduction  - 1 x daily - 3 x weekly - 2 sets - 10 reps - 3 sec hold - Sidelying Hip Abduction and Extension (Mirrored)  - 1 x daily - 3 x weekly - 2 sets - 5 reps - 3 sec hold - Seated 3 Way Exercise Ball Roll Out Stretch  - 1 x daily - 7 x weekly - 2 sets - 3 reps - 30 sec hold - Mini Squat with Chair  - 1 x daily - 3 x weekly - 2 sets - 10 reps - 3 sec hold - Supine Sciatic Nerve Glide  - 1 x daily - 7 x weekly - 2 sets - 10 reps - 3 sec hold  Patient Education - Lifting Techniques   ASSESSMENT:  CLINICAL IMPRESSION: Brinlee reports recurrence of her R buttock pain 2 days ago while trying to walk up an incline, with pain persisting today.  Pain localized to R lower buttock with TTP over distal glutes and piriformis but no TTP over ischial tuberosity or hamstring origin.  She states previously that sciatic nerve glide in supine seem to alleviate the pain but this does not seem to be helping currently.  Limited tolerance for MT as patient was very sensitive over the tender areas.  Reviewed stretching exercises to target glutes and piriformis but pain still limiting some of these.  Session concluded with application of moist heat pack to R buttock  to facilitate improved muscle relaxation and reduce pain.  Patient is scheduled to travel to [place] this weekend for her son's wedding, therefore encouraged frequent stops during travel to get out and stretch and walk around so as to avoid not being able to move by the time she reaches her destination.  Rondalyn will benefit from continued skilled PT to address ongoing flexibility, strength and balance deficits to improve mobility and activity tolerance with decreased pain interference and decreased risk for falls.   EVAL: [name] Trujillo is a 83 y.o. female who was referred to physical therapy for evaluation and treatment for R hip/buttock pain presumed to be the result of lumbar radiculopathy.  Patient reports onset of R buttock pain beginning Memorial Day weekend 2025.  Pain is worse with walking and positional changes.  Patient has deficits in hip ROM, proximal LE flexibility, B LE strength, abnormal posture, and TTP with abnormal muscle tension in R lower glutes and piriformis which are interfering with ADLs and are impacting quality of life.  On Modified Oswestry patient scored 17/50 demonstrating 34% or moderate disability.  On LEFS  patient scored 26/80 demonstrating moderate functional limitation.  Given her significant LE weakness and impaired gait pattern, will plan more formalized balance testing next visit.  Nikkia will benefit from skilled PT to address above deficits to improve mobility and activity tolerance with decreased pain interference.     OBJECTIVE IMPAIRMENTS: Abnormal gait, decreased activity tolerance, decreased balance, decreased knowledge of condition, decreased mobility, difficulty walking, decreased ROM, decreased strength, increased fascial restrictions, impaired perceived functional ability, increased muscle spasms, impaired flexibility, impaired sensation, improper body mechanics, postural dysfunction, and pain.   ACTIVITY LIMITATIONS: carrying, lifting, bending, standing,  squatting, sleeping, stairs, transfers, bed mobility, bathing, toileting, dressing, reach over head, hygiene/grooming, locomotion level, and caring for others  PARTICIPATION LIMITATIONS: meal prep, cleaning, laundry, driving, shopping, community activity, yard work, and church  PERSONAL FACTORS: Age, Past/current experiences, Time since onset of injury/illness/exacerbation, and 3+ comorbidities: L2-3 XLIF/PSF 05/24/22; C5-7 ACDF 09/21/20; idiopathic progressive neuropathy; hypthyroidism; DDD; remote h/o back surgery at age 26; h/o scoliosis; 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   REHAB POTENTIAL: Good  CLINICAL DECISION MAKING: Unstable/unpredictable  EVALUATION COMPLEXITY: High   GOALS: Goals reviewed with patient? Yes  SHORT TERM GOALS: Target date: 06/15/2024  Patient will be independent with initial HEP to improve outcomes and carryover.  Baseline: Initial HEP provided on eval Goal status: MET - 05/19/24  2.  Patient will report 25% improvement in low back pain to improve QOL. Baseline: 2/10 on eval, up to 10/10 06/03/24 - improvement in pain noted by end of today's session Goal status: MET - 06/08/24 - Pt reports 50+% improvement in pain thus far  3.  Complete standardized balance assessment and update LTG's as indicated. Baseline:  05/14/24 - , TUG and FGA completed; Berg and 5xSTS pending  05/19/24 GLENWOOD Levins completed Goal status: MET - 06/01/24 - 5xSTS completed  LONG TERM GOALS: Target date: 07/20/2024  Patient will be independent with ongoing/advanced HEP for self-management at home.  Baseline:  Goal status: IN PROGRESS - 06/22/24 - HEP reviewed today  2.  Patient will report 50-75% improvement in R hip/pelvic pain to improve QOL.  Baseline: 2/10 on eval, up to 10/10 Goal status: IN PROGRESS - 06/08/24 - Pt reports 50+%  improvement in pain thus far  3.  Patient will demonstrate improved B proximal LE strength to >/= 4 to 4+/5 for improved stability and ease of mobility. Baseline: Refer to above LE MMT table Goal status: INITIAL  4.  Patient will report >/= 35/80 on LEFS (MCID = 9 pts) to demonstrate improved functional ability. Baseline: 26 / 80 = 32.5 % Goal status: INITIAL   5. Patient will report </= 22% on Modified Oswestry (MCID = 12%) to demonstrate improved functional ability with decreased pain interference. Baseline: 17 / 50 = 34.0 % Goal status: INITIAL  6.  Patient will resume her normal walking routine w/o increased pain to allow for  improved mobility and activity tolerance. Baseline: R ischial pain limiting walking tolerance Goal status: INITIAL  7.  Patient will improve Berg score by at least 8 points or to >/= 45/56 (baseline as of prior D/C from PT) to improve safety and stability with ADLs in standing and reduce risk for falls.  Baseline: 42/56 (05/19/24) Goal status: INITIAL  8.  Patient will improve FGA score by at least 4 points or to >/= 19/30 (baseline as of prior D/C from PT) to improve  gait stability and reduce risk for falls.   Baseline: 10/30 (05/14/24) Goal status: INITIAL   9.  Patient will improve gait velocity to at least 2.62 ft/sec for improved gait efficiency and safety with community ambulation.  Baseline: 2.05 ft/sec with single hiking pole (05/14/24) Goal status: INITIAL     PLAN:  PT FREQUENCY: 2x/week  PT DURATION: 10 weeks  PLANNED INTERVENTIONS: 97164- PT Re-evaluation, 97750- Physical Performance Testing, 97110-Therapeutic exercises, 97530- Therapeutic activity, 97112- Neuromuscular re-education, 97535- Self Care, 02859- Manual therapy, [phone]- Gait training, [phone]- Electrical stimulation (unattended), 97035- Ultrasound, 02966- Ionotophoresis 4mg /ml Dexamethasone, 79439 (1-2 muscles), 20561 (3+ muscles)- Dry Needling, Patient/Family education, Balance  training, Stair training, Taping, Joint mobilization, DME instructions, Cryotherapy, and Moist heat  PLAN FOR NEXT SESSION: 10th visit progress note & [employer] Medicare reauthorization +/- possible recert; stretch progression as indicated; progress gentle lumbopelvic/proximal LE strengthening as pain allows; MT +/- TPDN to address abnormal muscle tension in glutes and piriformis   Elijah CHRISTELLA Hidden, PT 07/01/2024, 6:03 PM

## 2024-07-05 ENCOUNTER — Telehealth: Payer: Self-pay | Admitting: Neurological Surgery

## 2024-07-05 ENCOUNTER — Telehealth: Payer: Self-pay
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
## 2024-07-06 ENCOUNTER — Ambulatory Visit: Admitting: Physical Therapy

## 2024-07-06 ENCOUNTER — Encounter: Payer: Self-pay | Admitting: Physical Therapy

## 2024-07-06 ENCOUNTER — Ambulatory Visit: Admitting: Neurological Surgery

## 2024-07-06 DIAGNOSIS — M25551 Pain in right hip: Secondary | ICD-10-CM

## 2024-07-06 DIAGNOSIS — R2681 Unsteadiness on feet: Secondary | ICD-10-CM

## 2024-07-06 DIAGNOSIS — M5416 Radiculopathy, lumbar region: Secondary | ICD-10-CM

## 2024-07-06 DIAGNOSIS — R2689 Other abnormalities of gait and mobility: Secondary | ICD-10-CM

## 2024-07-06 DIAGNOSIS — M6281 Muscle weakness (generalized): Secondary | ICD-10-CM

## 2024-07-06 DIAGNOSIS — R252 Cramp and spasm: Secondary | ICD-10-CM

## 2024-07-06 NOTE — Therapy (Signed)
 OUTPATIENT PHYSICAL THERAPY TREATMENT / RECERTIFICATION  Progress Note  Reporting Period 05/11/2024 to 07/06/2024   See note below for Objective Data and Assessment of Progress/Goals.     Patient Name: [name] Trujillo MRN: [id number] DOB:Mar 30, 1941, 83 y.o., female Today's Date: 07/06/2024  END OF SESSION:  PT End of Session - 07/06/24 1445     Visit Number 10    Date for Recertification  08/31/24    Authorization Type [employer]    Authorization Time Period 05/11/24 - 08/09/24    Authorization - Visit Number 10    Authorization - Number of Visits 10    Progress Note Due on Visit 20    PT Start Time 1445    PT Stop Time 1535    PT Time Calculation (min) 50 min    Activity Tolerance Patient tolerated treatment well    Behavior During Therapy [facility] for tasks assessed/performed            Past Medical History:  Diagnosis Date   High cholesterol    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   She had ACUTE TAKOTSUBO (STRESS) CARDIOMYOPATHY with elevated Troponin Levels - this would be considered Demand Ischemia - Demand Infarction & NOT associated with ACS/CAD.     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  - Cath with NO CAD. ==> RESOLVED: f/u Echo 03/2022: EF 60-65%.   Past Surgical History:  Procedure Laterality Date   APPENDECTOMY     20-18 yo   BACK SURGERY     Age 3   CESAREAN SECTION     ECTOPIC PREGNANCY SURGERY     LAPAROSCOPIC HYSTERECTOMY     LEFT HEART CATH AND CORONARY ANGIOGRAPHY N/A 01/09/2022   Procedure: LEFT HEART CATH AND CORONARY ANGIOGRAPHY;  Surgeon: Wonda Sharper, MD;  Location: [facility] INVASIVE CV LAB;  Service: CV:: Widely patent coronaries with mild  nonobstructive LAD plaquing.  Right dominant system.  Normal LVEDP.  Based on clinical presentation, findings are consistent with acute Takotsubo Cardiomyopathy Syndrome   POSTERIOR FUSION LUMBAR SPINE  05/24/2022   [facility], [place]; [name], MD): L2-L3 XLIF, L2-L3 POSTERIOR DECOMPRESSION AND FUSION   TRANSTHORACIC ECHOCARDIOGRAM  01/08/2022   Severely decreased LV function-EF 25 to 30%.  Mid to apical (mostly anterior) with normal basal motion.  GR 2 DD-elevated LAP.  Mildly dilated LA.  Aortic sclerosis with no stenosis.  No AI.  Normal MV with mild to moderate TR.  Mildly elevated RAP, and PAP (estimated 49 mg). If LAD CAD ruled out - consistent with Takotsubo CM Syndrome.   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) 12/30/2023   New onset atrial fibrillation (HCC) 12/30/2023   Myopia of both eyes with astigmatism and presbyopia 05/06/2023   Dry eye syndrome of both eyes 05/06/2023   Mild tricuspid regurgitation 05/06/2022  Mild mitral regurgitation 05/06/2022   Hypertension 05/06/2022   History of seizure 05/06/2022   Takotsubo cardiomyopathy-resolved 01/10/2022   Hyperlipidemia with target LDL less than 100 01/10/2022   Hypothyroid 01/10/2022   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) 01/08/2022   Numbness in both legs 04/10/2021   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   Posterior vitreous detachment of both eyes 02/05/2018   Ventricular premature beats 10/31/2015   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   Pure hypercholesterolemia 06/28/2014   Plantar fasciitis 06/28/2014     PCP: Windy Coy, MD   REFERRING PROVIDER: Vernetta Lonni GRADE, MD  REFERRING DIAG:  [phone] (ICD-10-CM) - Pain in right hip  M54.50 (ICD-10-CM) - Low back pain, unspecified  Eval and treat low back/right pelvis and right hip/ishium Any modalities per therapist   THERAPY DIAG:  Radiculopathy, lumbar region  Pain in right hip  Muscle weakness (generalized)  Cramp and spasm  Other abnormalities of gait and mobility  Unsteadiness on feet  RATIONALE FOR EVALUATION AND TREATMENT: Rehabilitation  ONSET DATE: Memorial Day weekend  NEXT MD VISIT: 07/21/2024 or 07/22/2024   SUBJECTIVE:                                                                                                                                                                                                         SUBJECTIVE STATEMENT: Pt reports the car trip up to her son's wedding in [place] was worse than the trip home as far as her pain went - using the heated seats helped as did breaking up the trip.  She was able to work on cleaning the wall and floor on the side of her bed from hands and knees after her son moved the bed yesterday and feels like she feels better after being more active.  EVAL: [name] Trujillo is well know to me from several prior PT episodes for foot drop, cervical and lumbar radiculopathy.  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  Woke up the next morning with severe R hip/buttock pain.  Pain mostly localized to sciatic and ischial area of her R buttock.  PCP provider told her it was likely hip bursitis but ortho MD feels like it is more likely radicular from her back.  Some relief from steroid injection around her right hip ischial area provided by ortho MD but pain has never fully gone away.   She does have a significant lurch in her gait which is somewhat  more pronounced than on prior PT episodes with this  PT.  PAIN: Are you having pain? No and Yes: NPRS scale: 3-4/10  Pain location: R lower buttock  Pain description: constant   Aggravating factors: walking, certain sitting positions, bending over to tie her shoes  Relieving factors: Theraworx cream   PERTINENT HISTORY:  L2-3 XLIF/PSF 05/24/22; C5-7 ACDF 09/21/20; idiopathic progressive neuropathy; hypthyroidism; DDD; remote h/o back surgery at age 61; h/o scoliosis; Takotsubo cardiomyopathy 01/10/22; CAD; paroxysmal afib; mitral and tricuspid regurgitation; myalgia due to statin; recent cataract surgery  PRECAUTIONS: Fall  RED FLAGS: Bowel or bladder incontinence: Yes: chronic bladder issues, bowel urgency - somewhat worse than before   WEIGHT BEARING RESTRICTIONS: No  FALLS:  Has patient fallen in last 6 months? No  LIVING ENVIRONMENT: Lives with: lives with their spouse Lives in: House/apartment Stairs: Yes: External: 5-6 steps; on left going up Has following equipment at home: Walker - 2 wheeled, shower chair, and hiking/walking poles  OCCUPATION: Retired runner, broadcasting/film/video  PLOF: Independent and Leisure: walking ~30 minutes 1x/day (3x around the court near where she lives); sewing/quilting; likes working in the yard    PATIENT GOALS: To start walking again w/o doing more damage.   OBJECTIVE: (objective measures completed at initial evaluation unless otherwise dated)  DIAGNOSTIC FINDINGS:  04/21/24 - XR RIGHT HIP: An AP pelvis and lateral of the right hip shows well-maintained joint space with no cortical irregularities around the hip or the trochanteric area of the hip.  The lower lumbar spine shows severe degenerative changes at L4-L5 and L5-S1 on the AP view.   08/27/22 - LUMBAR SPINE - COMPLETE 4+ VIEW FINDINGS: There is mild levoconvex curvature of the lumbar spine, unchanged. Alignment is anatomic. There are bilateral transpedicular screws in posterior fusion rods present at L2-L3, new from prior. Disc spacers present. Alignment  is anatomic. No fractures are identified. There is moderate severe disc space narrowing at L4-L5 and L5-S1 as well as L1-L2 similar to the prior study. There are atherosclerotic calcifications of the aorta.   IMPRESSION: 1. Status post posterior fusion at L2-L3. Alignment is anatomic. 2. Multilevel degenerative disc disease, similar to prior study.  PATIENT SURVEYS:  Modified Oswestry:  MODIFIED OSWESTRY DISABILITY SCALE Date:  05/11/2024 07/06/24  Pain intensity 2 =  Pain medication provides me with complete relief from pain. 2  2. Personal care (washing, dressing, etc.) 1 =  I can take care of myself normally, but it increases my pain. 2  3. Lifting 4 = I can lift only very light weights 4  4. Walking 1 = Pain prevents me from walking more than 1 mile. 3  5. Sitting 1 =  I can only sit in my favorite chair as long as I like. 1  6. Standing 2 =  Pain prevents me from standing more than 1 hour 3  7. Sleeping 2 =  Even when I take pain medication, I sleep less than 6 hours 1  8. Social Life 2 = Pain prevents me from participating in more energetic activities (eg. sports, dancing). 3  9. Traveling 2 =  My pain restricts my travel over 2 hours. 3  10. Employment/ Homemaking 2 = I can perform most of my homemaking/job duties, but pain prevents me from performing more physically stressful activities (eg, lifting, vacuuming). 2  Total 17 / 50 = 34.0 % 24 / 50 = 48.0 %  Interpretation Moderate disability Severe disability   Interpretation of scores: Score Category Description  0-20%  Minimal Disability The patient can cope with most living activities. Usually no treatment is indicated apart from advice on lifting, sitting and exercise  21-40% Moderate Disability The patient experiences more pain and difficulty with sitting, lifting and standing. Travel and social life are more difficult and they may be disabled from work. Personal care, sexual activity and sleeping are not grossly affected, and the  patient can usually be managed by conservative means  41-60% Severe Disability Pain remains the main problem in this group, but activities of daily living are affected. These patients require a detailed investigation  61-80% Crippled Back pain impinges on all aspects of the patient's life. Positive intervention is required  81-100% Bed-bound These patients are either bed-bound or exaggerating their symptoms  Bluford FORBES Zoe DELENA Karon DELENA, et al. Surgery versus conservative management of stable thoracolumbar fracture: the PRESTO feasibility RCT. [place] ([place]): [employer]; 2021 Nov. [name] Technology Assessment, No. 25.62.) Appendix 3, Oswestry Disability Index category descriptors. Available from: Findjewel[employer]cz Minimally Clinically Important Difference (MCID) = 12.8%  LEFS  Extreme difficulty/unable (0), Quite a bit of difficulty (1), Moderate difficulty (2), Little difficulty (3), No difficulty (4) Survey date:  05/11/24 07/06/24   Any of your usual work, housework or school activities 1 2  2. Usual hobbies, recreational or sporting activities 2 2  3. Getting into/out of the bath 2 2  4. Walking between rooms 2 4  5. Putting on socks/shoes 2 4  6. Squatting  0 2  7. Lifting an object, like a bag of groceries from the floor 2 3  8. Performing light activities around your home 2 2  9. Performing heavy activities around your home 0 1  10. Getting into/out of a car 2 1  11. Walking 2 blocks 0 1  12. Walking 1 mile 2 0  13. Going up/down 10 stairs (1 flight) 2 3  14. Standing for 1 hour 2 1  15.  sitting for 1 hour 3 4  16. Running on even ground 0 0  17. Running on uneven ground 0 0  18. Making sharp turns while running fast 0 0  19. Hopping  0 0  20. Rolling over in bed 2 3  Score total:   26 / 80 = 32.5 % 35 / 80 = 43.8 %  Functional limitation: Moderate Moderate     SCREENING FOR RED FLAGS: Bowel or bladder incontinence: Yes: chronic  bladder issues, bowel urgency - somewhat worse than before Spinal tumors: No Cauda equina syndrome: No Compression fracture: No Abdominal aneurysm: No  COGNITION:  Overall cognitive status: Within functional limits for tasks assessed    SENSATION: WFL Except B distal LE neuropathy  POSTURE:  rounded shoulders, forward head, right pelvic obliquity, and scoliosis  PALPATION: Increased muscle tension in L>R lumbar paraspinals and R>L glutes and piriformis with TTP over inferior glutes and piriformis near hamstring origin  LUMBAR ROM: *Lumbar ROM assessed with UE support for balance and close SBA/CGA of PT  Active  Eval  Flexion Hands to distal shins  Extension 50% limited  Right lateral flexion Hand to upper thigh - p!  Left lateral flexion Hand to lateral knee  Right rotation 50% limited  Left rotation 30% limited - p!  (Blank rows = not tested)  MUSCLE LENGTH: Hamstrings: mild/mod tight B ITB: mild tight B Piriformis: mod/severe tight B Hip IR: mod tight B Hip flexors: mild tight R Quads: mild tight R>L Heelcord: mild/mod tight B  LOWER EXTREMITY  ROM:    Grossly WFL other than limitations indicated above due to muscle tightness   LOWER EXTREMITY MMT:    MMT Right eval Left eval R 07/06/24 L 07/06/24  Hip flexion 3+ 4- 4- 4  Hip extension 4- 4 4 4   Hip abduction 3+ 4- 4 4  Hip adduction 3+ 4- 4- 4  Hip internal rotation 4- 4- 4+ 4  Hip external rotation 3+ 3+ 4 4-  Knee flexion 4 4 4+ 4+  Knee extension 4 4 4+ 4+  Ankle dorsiflexion 3- 3- 3+ 3-  Ankle plantarflexion      Ankle inversion      Ankle eversion       (Blank rows = not tested)  FUNCTIONAL TESTS: (Assessment completed 05/14/24 unless otherwise indicated) 5 times sit to stand: 12.37 sec w/o UE assist - 06/01/24 Timed up and go (TUG): 13.75 sec with single hiking pole 10 meter walk test: 16.00 sec with single hiking pole Gait speed: 2.05 ft/sec with single hiking pole Berg Balance Scale: 42/56; 37-45  significant (>80%) - 05/19/24 Functional gait assessment: 10/30, < 19 = high risk fall    Standardized testing results as of discharge from latest PT episode (11/11/23): 5xSTS = 11.84 sec  = 13.63 sec w/o AD Gait speed = 2.41 ft/sec w/o AD Berg = 45/56; scores of 37-45 indicate significant (>80%) fall risk  FGA = 19/30; scores of 19-24 indicate a medium risk for falls   GAIT: Distance walked: Clinic distances Assistive device utilized: Single walking/trekking pole on R Level of assistance: SBA Gait pattern: Increased sway, step through pattern, decreased stride length, trendelenburg, lateral hip instability, poor foot clearance- Right, and poor foot clearance- Left Comments: B foot slap   TODAY'S TREATMENT:   07/06/2024 THERAPEUTIC EXERCISE: To improve strength and endurance.   Rec Bike - L4 x 3 min, L3 x 3 min - resistance level reduced due to increasing fatigue  THERAPEUTIC ACTIVITIES: To improve functional performance.  Demonstration, verbal and tactile cues throughout for technique.  LE MMT - see above LE MMT table LEFS: 35/80 = 43.8% indicating moderate functional limitation Modified Oswestry: 24/50 = 48% or severe disability Gait speed = 2.08 ft/sec with single hiking pole Goal assessment  PHYSICAL PERFORMANCE TEST or MEASUREMENT: Functional Gait  Assessment  Gait Level Surface Walks 20 ft, slow speed, abnormal gait pattern, evidence for imbalance or deviates 10-15 in outside of the 12 in walkway width. Requires more than 7 sec to ambulate 20 ft.   Change in Gait Speed Makes only minor adjustments to walking speed, or accomplishes a change in speed with significant gait deviations, deviates 10-15 in outside the 12 in walkway width, or changes speed but loses balance but is able to recover and continue walking.   Gait with Horizontal Head Turns Performs head turns with moderate changes in gait velocity, slows down, deviates 10-15 in outside 12 in walkway width but recovers,  can continue to walk.   Gait with Vertical Head Turns Performs task with slight change in gait velocity (eg, minor disruption to smooth gait path), deviates 6 - 10 in outside 12 in walkway width or uses assistive device   Gait and Pivot Turn Pivot turns safely in greater than 3 sec and stops with no loss of balance, or pivot turns safely within 3 sec and stops with mild imbalance, requires small steps to catch balance.   Step Over Obstacle Is able to step over one shoe box (4.5 in total height)  without changing gait speed. No evidence of imbalance.   Gait with Narrow Base of Support Ambulates less than 4 steps heel to toe or cannot perform without assistance.   Gait with Eyes Closed Cannot walk 20 ft without assistance, severe gait deviations or imbalance, deviates greater than 15 in outside 12 in walkway width or will not attempt task.   Ambulating Backwards Walks 20 ft, slow speed, abnormal gait pattern, evidence for imbalance, deviates 10-15 in outside 12 in walkway width.   Steps Alternating feet, must use rail.   Total Score 12   FGA comment: < 19 = high risk fall       07/01/2024  THERAPEUTIC EXERCISE: To improve strength, endurance, ROM, and flexibility.  Demonstration, verbal and tactile cues throughout for technique.  Rec Bike - L3 x 6 min Seated 3-way thoracolumbar flexion stretch 2 x 30 each position, using hiking pole for UE support Supine R sciatic nerve glides 2 x 10 bil Hooklying R figure-4 piriformis stretch x 30 Hooklying R KTOS piriformis stretch x 30 - pt noting increased groin anterior thigh discomfort Mod thomas R quad/hip flexor stretch 2 x 30 - 1 rep each w/o and with strap Supine R HS stretch with strap x 30 Supine R ITB stretch with strap x 30   MANUAL THERAPY: To promote normalized muscle tension, improved flexibility, improved joint mobility, and reduced pain utilizing connective tissue massage, therapeutic massage, manual TP therapy, and myofascial release.   STM/DTM and manual TPR to R lower glutes and piriformis   SELF CARE:  Provided education on self-STM/muscle release options and need to break up her travel to [place] with frequent stops to allow her to stretch and walk around.   MODALITIES: (non-billable) Moist heat pack to R glutes x 10' at end of session   06/24/2024 THERAPEUTIC EXERCISE: To improve strength, endurance, ROM, and flexibility.  Demonstration, verbal and tactile cues throughout for technique.  Rec Bike - L3 x 6 min Seated 3-way thoracolumbar flexion stretch 2 x 30 each position, using hiking pole for UE support, clarifying angle for lateral stretches Seated B foot/ankle arch raises x 10 Seated toe curls/towel scrunches - patient with difficulty achieving grip of towel with toes therefore suggested patient try using a small object like a pen or pencil to work on grip with toes at home  NEUROMUSCULAR RE-EDUCATION: To improve coordination, kinesthesia, and reduce nerve irritation. Seated sciatic nerve glides - difficulty coordinating movement  Supine sciatic nerve glides 2 x 10 bil Supine sciatic nerve glides with 90/90 knee flexion x 10 bil - patient reporting better response to previous exercise  THERAPEUTIC ACTIVITIES: To improve functional performance.  Demonstration, verbal and tactile cues throughout for technique. Unsupported mini-squat hovering over mat table with arms forward for balance (chair placed in front for safety with intermittent support) x 10 - attempted in bare feet today as patient feeling like she is losing proprioceptive awareness and control in her feet, however still demonstrating frequent loss of eccentric control/uncontrolled descent onto mat table   06/22/2024 THERAPEUTIC EXERCISE: To improve strength, endurance, and flexibility.  Demonstration, verbal and tactile cues throughout for technique.  Rec Bike - L3 x 6 min Seated 3-way thoracolumbar flexion stretch 2 x 30 each position using  hiking pole for UE support  THERAPEUTIC ACTIVITIES: To improve functional performance.  Demonstration, verbal and tactile cues throughout for technique. Unsupported mini-squat hovering over mat table with arms forward for balance (chair placed in front for safety with intermittent support)  x 10 - more frequent loss of eccentric control/uncontrolled descent onto mat table Unsupported mini-squat with glute touch touch to rounded bolster on mat table with arms forward for balance (chair placed in front for safety with intermittent support) x 10  NEUROMUSCULAR RE-EDUCATION: To improve balance, coordination, kinesthesia, posture, proprioception, and amplitude of movement.  S/L R hip ABD/ext diagonals 2 x 5 - L knee flexed for increased ant/post stability to prevent pt from rolling backward & PT guiding motion for 1st few reps; repeated x 10 with L LE  S/L R hip circumduction arc over L LE tapping R foot in front and behind L foot x 5 - very limited eccentric control with posterior tap; repeated x 10 with L LE   06/08/2024 THERAPEUTIC EXERCISE: To improve strength, endurance, ROM, and flexibility.  Demonstration, verbal and tactile cues throughout for technique. Rec Bike - L3 x 6 min Seated 3-way thoracolumbar flexion stretch 2 x 30 each position - 1st reps with hands on green Pball, 2nd reps using hiking pole for UE support Quadruped 3-way child's pose x 30 each  THERAPEUTIC ACTIVITIES: To improve functional performance.  Demonstration, verbal and tactile cues throughout for technique. Reviewed proper lifting mechanics using squatting motion and golfer's lift  Unsupported mini-squat hovering over mat table with arms forward for balance (chair placed in front for safety but not needed for support) x 8  SELF CARE:  Provided education on managing muscle cramping through stretching, increased hydration, and potential Mg supplement (instructed pt to verify with PCP that there are no concerns or  potential medicine interactions with trying Mg supplement).   06/03/2024  THERAPEUTIC EXERCISE: To improve strength, endurance, and flexibility.  Demonstration, verbal and tactile cues throughout for technique.  Rec Bike - L3 x 6 min Standing lateral lean R ITB over back of chair 2 x 30 Supine ITB stretch with strap 3 x 30 bil  MANUAL THERAPY: To promote normalized muscle tension, improved flexibility, and reduced pain utilizing connective tissue massage, therapeutic massage, manual TP therapy, and myofascial release.  STM/DTM, IASTM with foam roller and manual TPR to R distal glutes, HS and ITB  NEUROMUSCULAR RE-EDUCATION: To improve coordination, kinesthesia, posture, proprioception, and amplitude of movement.  S/L R hip ABD/ext diagonals x 10 - slight PT assist initially to stabilize hips to prevent anterior/posterior rotation & VC/TC to avoid hip ER S/L R hip circumduction arc over L LE tapping R foot in front and behind L foot x 5 - very limited eccentric control with posterior tap   SELF CARE:  HEP update Provided instruction in self-STM techniques to R ITB using rolling pin.    06/01/2024 THERAPEUTIC EXERCISE: To improve strength and endurance.  Demonstration, verbal and tactile cues throughout for technique. NuStep - L4 x 6 min (LE only)  THERAPEUTIC ACTIVITIES: To improve functional performance.  Demonstration, verbal and tactile cues throughout for technique. 5xSTS = 12.37 sec w/o UE assist, somewhat uncontrolled descent esp on last rep  TRIGGER POINT DRY NEEDLING: Treatment instructions/education: Initial Treatment: Pt instructed on Dry Needling rational, procedures, and possible side effects. Pt instructed to expect mild to moderate muscle soreness later in the day and/or into the next day.  Pt instructed in methods to reduce muscle soreness. Pt instructed to continue prescribed HEP. Patient was educated on signs and symptoms of infection and other risk factors and  advised to seek medical attention should they occur.  Patient verbalized understanding of these instructions and education.  Education Handout Provided: Yes Consent:  Patient Verbal Consent Given: Yes Treatment: Muscles Treated: R medial and lateral piriformis, R glute medius Utilized skilled palpation to identify bony landmarks and trigger points along with monitoring of soft tissue during DN. Electrical Stimulation Performed: No Treatment Response/Outcome: Twitch response elicited, Palpable increase in muscle length, Decreased tissue resistance noted, Decreased pain/TTP, and Improved exercise tolerance  MANUAL THERAPY: To promote normalized muscle tension, improved flexibility, and reduced pain. STM/DTM, manual TPR and pin & stretch to muscles addressed with DN   NEUROMUSCULAR RE-EDUCATION: To improve balance, coordination, kinesthesia, posture, proprioception, and amplitude of movement. Mini-squat with counter support x 10   05/19/24 THERAPEUTIC EXERCISE: To improve ROM and flexibility.  Demonstration, verbal and tactile cues throughout for technique.  Bike L3x70min Standing runner stretch x 30 BLE Standing soleus stretch x 30 BLE Table piriformis stretch 2x30 RLE Supine L figure 4 stretch 2x30 LLE Supine glute stretch 2x30 BLE Supine KTOS stretch 2x30 BLE  Seated clams with GTB + trA x 10 BLE Seated marching GTB x + TrA 10 BLE Berg Balance Test  Sit to Stand Able to stand without using hands and stabilize independently   Standing Unsupported Able to stand 2 minutes with supervision   Sitting with Back Unsupported but Feet Supported on Floor or Stool Able to sit safely and securely 2 minutes   Stand to Sit Sits safely with minimal use of hands   Transfers Able to transfer safely, minor use of hands   Standing Unsupported with Eyes Closed Able to stand 10 seconds with supervision   Standing Unsupported with Feet Together Able to place feet together independently and stand 1  minute safely   From Standing, Reach Forward with Outstretched Arm Can reach confidently >25 cm (10)   From Standing Position, Pick up Object from Floor Able to pick up shoe safely and easily   From Standing Position, Turn to Look Behind Over each Shoulder Looks behind one side only/other side shows less weight shift   Turn 360 Degrees Able to turn 360 degrees safely but slowly   Standing Unsupported, Alternately Place Feet on Step/Stool Able to complete 4 steps without aid or supervision   Standing Unsupported, One Foot in Front Needs help to step but can hold 15 seconds   Standing on One Leg Unable to try or needs assist to prevent fall   Total Score 42       05/14/2024  THERAPEUTIC ACTIVITIES: To improve functional performance.  Demonstration, verbal and tactile cues throughout for technique.  5xSTS: Deferred due to increased pain with uncontrolled descent upon sitting TUG: 13.75 sec with single hiking pole : 16.00 sec with single hiking pole Gait speed: 2.05 ft/sec with single hiking pole  PHYSICAL PERFORMANCE TEST or MEASUREMENT: Functional Gait  Assessment  Gait Level Surface Walks 20 ft, slow speed, abnormal gait pattern, evidence for imbalance or deviates 10-15 in outside of the 12 in walkway width. Requires more than 7 sec to ambulate 20 ft.   Change in Gait Speed Makes only minor adjustments to walking speed, or accomplishes a change in speed with significant gait deviations, deviates 10-15 in outside the 12 in walkway width, or changes speed but loses balance but is able to recover and continue walking.   Gait with Horizontal Head Turns Performs head turns with moderate changes in gait velocity, slows down, deviates 10-15 in outside 12 in walkway width but recovers, can continue to walk.   Gait with Vertical Head Turns Performs task with moderate change in gait velocity,  slows down, deviates 10-15 in outside 12 in walkway width but recovers, can continue to walk.   Gait and  Pivot Turn Turns slowly, requires verbal cueing, or requires several small steps to catch balance following turn and stop   Step Over Obstacle Is able to step over one shoe box (4.5 in total height) without changing gait speed. No evidence of imbalance.   Gait with Narrow Base of Support Ambulates less than 4 steps heel to toe or cannot perform without assistance.   Gait with Eyes Closed Cannot walk 20 ft without assistance, severe gait deviations or imbalance, deviates greater than 15 in outside 12 in walkway width or will not attempt task.   Ambulating Backwards Walks 20 ft, slow speed, abnormal gait pattern, evidence for imbalance, deviates 10-15 in outside 12 in walkway width.   Steps Alternating feet, must use rail.   Total Score 10   FGA comment: < 19 = high risk fall      THERAPEUTIC EXERCISE: To improve ROM and flexibility.  Demonstration, verbal and tactile cues throughout for technique.  Hooklying R HS stretch with strap hamstring 2 x 30 Supine gluteus stretch 2 x 30 bil Hooklying figure-4 piriformis stretch x 30 on L, deferred d/t increased pain on R Hooklying KTOS piriformis stretch x 30 on L, deferred d/t increased pain on R Side-sitting R modified pigeon piriformis stretch 2 x 30 - good stretch noted without increased pain Seated R KTOS piriformis stretch 2 x 30 - good stretch noted without increased pain  SELF CARE:   Modified HEP to address concerns regarding increased R buttock/hip pain with piriformis stretching, with patient reporting better tolerance for seated piriformis stretching.  Patient cautioned not to force any painful ROM/stretching and report back to PT next visit if HEP continues to irritate or cause increased pain. Provided instruction in self-STM techniques to R glutes/piriformis using tennis ball against wall or in chair.    05/04/2024  SELF CARE:  Reviewed eval findings and role of PT in addressing identified deficits as well as instruction in initial  HEP (see below).    PATIENT EDUCATION:  Education details: standardized testing results and interpretation, progress with PT, and ongoing PT POC  Person educated: Patient Education method: Explanation Education comprehension: verbalized understanding  HOME EXERCISE PROGRAM: Access Code: [facility] URL: [email or web address] Date: 06/24/2024 Prepared by: Elijah Hidden  Exercises - Standing Soleus Stretch  - 1 x daily - 7 x weekly - 3 sets - 3 reps - 30 sec hold - Hooklying Hamstring Stretch with Strap  - 2 x daily - 7 x weekly - 3 reps - 30 sec hold - Supine Gluteus Stretch  - 2 x daily - 7 x weekly - 3 reps - 30 sec hold - Supine Figure 4 Piriformis Stretch  - 2 x daily - 7 x weekly - 3 reps - 30 sec hold - Supine Piriformis Stretch with Foot on Ground  - 2 x daily - 7 x weekly - 3 reps - 30 sec hold - Seated Table Piriformis Stretch  - 2 x daily - 7 x weekly - 3 reps - 30 sec hold - Seated Piriformis Stretch  - 2 x daily - 7 x weekly - 3 reps - 30 sec hold - Seated March with Resistance  - 1 x daily - 7 x weekly - 2 sets - 10 reps - Seated Isometric Hip Abduction with Resistance  - 1 x daily - 7 x weekly - 2 sets - 10  reps - Glute Max Release With [employer] Against Wall  - 1 x daily - 7 x weekly - 1-2 min hold - Mini Squat with Counter Support  - 1 x daily - 3 x weekly - 2 sets - 10 reps - 3-5 sec hold - Standing ITB Stretch (Mirrored)  - 2 x daily - 7 x weekly - 3 reps - 30 sec hold - Supine Iliotibial Band Stretch with Strap  - 2 x daily - 7 x weekly - 3 reps - 30 sec hold - Sidelying Diagonal Hip Abduction  - 1 x daily - 3 x weekly - 2 sets - 10 reps - 3 sec hold - Sidelying Hip Abduction and Extension (Mirrored)  - 1 x daily - 3 x weekly - 2 sets - 5 reps - 3 sec hold - Seated 3 Way Exercise Ball Roll Out Stretch  - 1 x daily - 7 x weekly - 2 sets - 3 reps - 30 sec hold - Mini Squat with Chair  - 1 x daily - 3 x weekly - 2 sets - 10 reps - 3 sec hold - Supine  Sciatic Nerve Glide  - 1 x daily - 7 x weekly - 2 sets - 10 reps - 3 sec hold  Patient Education - Lifting Techniques   ASSESSMENT:  CLINICAL IMPRESSION: [name] Trujillo reports 50-60% improvement in her overall pain since start of PT but continues to experience unpredictable episodes of more intense pain which prevents her from resuming walking for exercise and completing other daily activities.  LE MMT revealing overall gains in LE strength other than chronic B ankle weakness, however proximal instability still noted during functional tasks and ambulation.  LEFS has improved from 26/80 to 35/80, however modified Oswestry indicating a decline from 34% disability to 48% disability related to recent exacerbation of pain when attempting to walk up incline in her neighborhood last week.  Gait speed essentially unchanged at 2.08 ft/sec.  2 point gain noted on FGA to 12/30, however this still indicates a high risk for falls.  Unable to reassess [name] Trujillo today due to time constraints.  [name] Trujillo is demonstrating good overall progress with PT, with all STG's met and continued progress noted towards LTG's.  She will benefit from continued skilled PT to address ongoing flexibility, strength and balance deficits to improve mobility and activity tolerance with decreased pain interference and decreased risk for falls, therefore will recommend recert for additional 2x/wk for up to 8 weeks.    EVAL: [name] Trujillo is a 83 y.o. female who was referred to physical therapy for evaluation and treatment for R hip/buttock pain presumed to be the result of lumbar radiculopathy.  Patient reports onset of R buttock pain beginning Memorial Day weekend 2025.  Pain is worse with walking and positional changes.  Patient has deficits in hip ROM, proximal LE flexibility, B LE strength, abnormal posture, and TTP with abnormal muscle tension in R lower glutes and piriformis which are interfering with ADLs and are impacting quality of life.  On  Modified Oswestry patient scored 17/50 demonstrating 34% or moderate disability.  On LEFS patient scored 26/80 demonstrating moderate functional limitation.  Given her significant LE weakness and impaired gait pattern, will plan more formalized balance testing next visit.  [name] Trujillo will benefit from skilled PT to address above deficits to improve mobility and activity tolerance with decreased pain interference.     OBJECTIVE IMPAIRMENTS: Abnormal gait, decreased activity tolerance, decreased balance, decreased knowledge of condition, decreased mobility,  difficulty walking, decreased ROM, decreased strength, increased fascial restrictions, impaired perceived functional ability, increased muscle spasms, impaired flexibility, impaired sensation, improper body mechanics, postural dysfunction, and pain.   ACTIVITY LIMITATIONS: carrying, lifting, bending, standing, squatting, sleeping, stairs, transfers, bed mobility, bathing, toileting, dressing, reach over head, hygiene/grooming, locomotion level, and caring for others  PARTICIPATION LIMITATIONS: meal prep, cleaning, laundry, driving, shopping, community activity, yard work, and church  PERSONAL FACTORS: Age, Past/current experiences, Time since onset of injury/illness/exacerbation, and 3+ comorbidities: L2-3 XLIF/PSF 05/24/22; C5-7 ACDF 09/21/20; idiopathic progressive neuropathy; hypthyroidism; DDD; remote h/o back surgery at age 61; h/o scoliosis; 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   REHAB POTENTIAL: Good  CLINICAL DECISION MAKING: Unstable/unpredictable  EVALUATION COMPLEXITY: High   GOALS: Goals reviewed with patient? Yes  SHORT TERM GOALS: Target date: 06/15/2024  Patient will be independent with initial HEP to improve outcomes and carryover.  Baseline: Initial HEP provided on eval Goal status:  MET - 05/19/24  2.  Patient will report 25% improvement in low back pain to improve QOL. Baseline: 2/10 on eval, up to 10/10 06/03/24 - improvement in pain noted by end of today's session Goal status: MET - 06/08/24 - Pt reports 50+% improvement in pain thus far  3.  Complete standardized balance assessment and update LTG's as indicated. Baseline:  05/14/24 - , TUG and FGA completed; Berg and 5xSTS pending  05/19/24 [name] Trujillo completed Goal status: MET - 06/01/24 - 5xSTS completed  LONG TERM GOALS: Target date: 07/20/2024, extended to 08/31/2024  Patient will be independent with ongoing/advanced HEP for self-management at home.  Baseline:  06/22/24 - HEP reviewed today Goal status: IN PROGRESS - 07/06/24 - no concerns with current HEP  2.  Patient will report 50-75% improvement in R hip/pelvic pain to improve QOL.  Baseline: 2/10 on eval, up to 10/10 06/08/24 - Pt reports 50+% improvement in pain thus far Goal status: IN PROGRESS - 07/06/24 - 50-60% improvement overall, but still having periods of more intense pain  3.  Patient will demonstrate improved B proximal LE strength to >/= 4 to 4+/5 for improved stability and ease of mobility. Baseline: Refer to above LE MMT table Goal status: IN PROGRESS - 07/06/24   4.  Patient will report >/= 35/80 on LEFS (MCID = 9 pts) to demonstrate improved functional ability. Baseline: 26 / 80 = 32.5 % Goal status: MET - 07/06/24 - 35 / 80 = 43.8 %  5. Patient will report </= 22% on Modified Oswestry (MCID = 12%) to demonstrate improved functional ability with decreased pain interference. Baseline: 17 / 50 = 34.0 % Goal status: IN PROGRESS - 07/06/24 - 24 / 50 = 48.0 %  6.  Patient will resume her normal walking routine w/o increased pain to allow for  improved mobility and activity tolerance. Baseline: R ischial pain limiting walking tolerance Goal status: IN PROGRESS - 07/06/24 - attempted to try walking in her neighborhood last week but  experienced increased pain while trying to walk up a incline that caused her to have to stop and return home   7.  Patient will improve Berg score by at least 8 points or to >/= 45/56 (baseline as of prior D/C from PT) to improve safety and stability with ADLs in standing and reduce risk for falls.  Baseline: 42/56 (05/19/24) Goal status: IN PROGRESS   8.  Patient will improve FGA score by  at least 4 points or to >/= 19/30 (baseline as of prior D/C from PT) to improve gait stability and reduce risk for falls.   Baseline: 10/30 (05/14/24) Goal status: IN PROGRESS - 07/06/24 - 12/30  9.  Patient will improve gait velocity to at least 2.62 ft/sec for improved gait efficiency and safety with community ambulation.  Baseline: 2.05 ft/sec with single hiking pole (05/14/24) Goal status: IN PROGRESS - 07/06/24 - 2.08 ft/sec with single hiking pole   PLAN:  PT FREQUENCY: 2x/week  PT DURATION: 8 weeks  PLANNED INTERVENTIONS: 97164- PT Re-evaluation, 97750- Physical Performance Testing, 97110-Therapeutic exercises, 97530- Therapeutic activity, [id number]- Neuromuscular re-education, 97535- Self Care, [id number]- Manual therapy, [phone]- Gait training, [phone]- Electrical stimulation (unattended), 97035- Ultrasound, 02966- Ionotophoresis 4mg /ml Dexamethasone, 79439 (1-2 muscles), [id number] (3+ muscles)- Dry Needling, Patient/Family education, Balance training, Stair training, Taping, Joint mobilization, DME instructions, Cryotherapy, and Moist heat  PLAN FOR NEXT SESSION: stretch progression as indicated; progress gentle lumbopelvic/proximal LE strengthening as pain allows; MT +/- TPDN to address abnormal muscle tension in glutes and piriformis; balance training to decrease fall risk   Elijah CHRISTELLA Hidden, PT 07/06/2024, 6:30 PM

## 2024-07-08 ENCOUNTER — Encounter: Payer: Self-pay | Admitting: Physical Therapy

## 2024-07-08 ENCOUNTER — Ambulatory Visit: Admitting: Physical Therapy

## 2024-07-08 DIAGNOSIS — R2681 Unsteadiness on feet: Secondary | ICD-10-CM

## 2024-07-08 DIAGNOSIS — R252 Cramp and spasm: Secondary | ICD-10-CM

## 2024-07-08 DIAGNOSIS — M5416 Radiculopathy, lumbar region: Secondary | ICD-10-CM | POA: Diagnosis not present

## 2024-07-08 DIAGNOSIS — R2689 Other abnormalities of gait and mobility: Secondary | ICD-10-CM

## 2024-07-08 DIAGNOSIS — M25551 Pain in right hip: Secondary | ICD-10-CM

## 2024-07-08 DIAGNOSIS — M6281 Muscle weakness (generalized): Secondary | ICD-10-CM

## 2024-07-08 NOTE — Therapy (Signed)
 OUTPATIENT PHYSICAL THERAPY TREATMENT      Patient Name: [name] Trujillo MRN: [id number] DOB:1940-11-24, 83 y.o., female Today's Date: 07/08/2024  END OF SESSION:  PT End of Session - 07/08/24 1447     Visit Number 11    Date for Recertification  08/31/24    Authorization Type [employer]    Authorization Time Period 07/07/24 - 08/31/24    Authorization - Visit Number 1    Authorization - Number of Visits 8    Progress Note Due on Visit 20    PT Start Time 1446    PT Stop Time 1550    PT Time Calculation (min) 64 min    Activity Tolerance Patient tolerated treatment well    Behavior During Therapy 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   She had ACUTE TAKOTSUBO (STRESS) CARDIOMYOPATHY with elevated Troponin Levels - this would be considered Demand Ischemia - Demand Infarction & NOT associated with ACS/CAD.     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  - Cath with NO CAD. ==> RESOLVED: f/u Echo 03/2022: EF 60-65%.   Past Surgical History:  Procedure Laterality Date   APPENDECTOMY     24-18 yo   BACK SURGERY     Age 37   CESAREAN SECTION     ECTOPIC PREGNANCY SURGERY     LAPAROSCOPIC HYSTERECTOMY     LEFT HEART CATH AND CORONARY ANGIOGRAPHY N/A 01/09/2022   Procedure: LEFT HEART CATH AND CORONARY ANGIOGRAPHY;  Surgeon: Wonda Sharper, MD;  Location: [facility] INVASIVE CV LAB;  Service: CV:: Widely patent coronaries with mild nonobstructive LAD plaquing.  Right dominant system.  Normal LVEDP.  Based on clinical presentation, findings are consistent with acute Takotsubo  Cardiomyopathy Syndrome   POSTERIOR FUSION LUMBAR SPINE  05/24/2022   [facility], [place]; [name], MD): L2-L3 XLIF, L2-L3 POSTERIOR DECOMPRESSION AND FUSION   TRANSTHORACIC ECHOCARDIOGRAM  01/08/2022   Severely decreased LV function-EF 25 to 30%.  Mid to apical (mostly anterior) with normal basal motion.  GR 2 DD-elevated LAP.  Mildly dilated LA.  Aortic sclerosis with no stenosis.  No AI.  Normal MV with mild to moderate TR.  Mildly elevated RAP, and PAP (estimated 49 mg). If LAD CAD ruled out - consistent with Takotsubo CM Syndrome.   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) 12/30/2023   New onset atrial fibrillation (HCC) 12/30/2023   Myopia of both eyes with astigmatism and presbyopia 05/06/2023   Dry eye syndrome of both eyes 05/06/2023   Mild tricuspid regurgitation 05/06/2022   Mild mitral regurgitation 05/06/2022   Hypertension 05/06/2022   History of seizure 05/06/2022   Takotsubo cardiomyopathy-resolved 01/10/2022  Hyperlipidemia with target LDL less than 100 01/10/2022   Hypothyroid 01/10/2022   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) 01/08/2022   Numbness in both legs 04/10/2021   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   Posterior vitreous detachment of both eyes 02/05/2018   Ventricular premature beats 10/31/2015   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   Pure hypercholesterolemia 06/28/2014   Plantar fasciitis 06/28/2014    PCP: Windy Coy, MD   REFERRING PROVIDER: Vernetta Lonni GRADE, MD  REFERRING DIAG:  [phone] (ICD-10-CM) - Pain in right hip   M54.50 (ICD-10-CM) - Low back pain, unspecified  Eval and treat low back/right pelvis and right hip/ishium Any modalities per therapist   THERAPY DIAG:  Radiculopathy, lumbar region  Pain in right hip  Muscle weakness (generalized)  Cramp and spasm  Other abnormalities of gait and mobility  Unsteadiness on feet  RATIONALE FOR EVALUATION AND TREATMENT: Rehabilitation  ONSET DATE: Memorial Day weekend  NEXT MD VISIT: 07/21/2024 or 07/22/2024   SUBJECTIVE:                                                                                                                                                                                                         SUBJECTIVE STATEMENT: Pt reports another episode of sharp pain in her R anterolateral hip while walking across a room yesterday that made her stop in her tracks and caused her to have to shift to using her walker yesterday evening.  Pain increased again while taking her shower to prepare to come to PT today, however she was able to get some relief by going to her bed and doing her HEP exercises.  EVAL: [name] Trujillo is well know to me from several prior PT episodes for foot drop, cervical and lumbar radiculopathy.  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  Woke up the next morning with severe R hip/buttock pain.  Pain mostly localized to sciatic and ischial area of her R buttock.  PCP provider told her it was likely hip bursitis but ortho MD feels like it is more likely radicular from her back.  Some relief from steroid injection around her right hip ischial area provided by ortho MD but pain has never fully gone away.   She does have a significant lurch in her gait which is somewhat more pronounced than on prior PT episodes with this PT.  PAIN: Are you having pain? No and Yes: NPRS scale: 3-4/10  Pain location:  R lower buttock  Pain description: constant    Aggravating factors: walking, certain sitting positions, bending over to tie her shoes  Relieving factors: Theraworx cream   PERTINENT HISTORY:  L2-3 XLIF/PSF 05/24/22; C5-7 ACDF 09/21/20; idiopathic progressive neuropathy; hypthyroidism; DDD; remote h/o back surgery at age 20; h/o scoliosis; Takotsubo cardiomyopathy 01/10/22; CAD; paroxysmal afib; mitral and tricuspid regurgitation; myalgia due to statin; recent cataract surgery  PRECAUTIONS: Fall  RED FLAGS: Bowel or bladder incontinence: Yes: chronic bladder issues, bowel urgency - somewhat worse than before   WEIGHT BEARING RESTRICTIONS: No  FALLS:  Has patient fallen in last 6 months? No  LIVING ENVIRONMENT: Lives with: lives with their spouse Lives in: House/apartment Stairs: Yes: External: 5-6 steps; on left going up Has following equipment at home: Walker - 2 wheeled, shower chair, and hiking/walking poles  OCCUPATION: Retired runner, broadcasting/film/video  PLOF: Independent and Leisure: walking ~30 minutes 1x/day (3x around the court near where she lives); sewing/quilting; likes working in the yard    PATIENT GOALS: To start walking again w/o doing more damage.   OBJECTIVE: (objective measures completed at initial evaluation unless otherwise dated)  DIAGNOSTIC FINDINGS:  04/21/24 - XR RIGHT HIP: An AP pelvis and lateral of the right hip shows well-maintained joint space with no cortical irregularities around the hip or the trochanteric area of the hip.  The lower lumbar spine shows severe degenerative changes at L4-L5 and L5-S1 on the AP view.   08/27/22 - LUMBAR SPINE - COMPLETE 4+ VIEW FINDINGS: There is mild levoconvex curvature of the lumbar spine, unchanged. Alignment is anatomic. There are bilateral transpedicular screws in posterior fusion rods present at L2-L3, new from prior. Disc spacers present. Alignment is anatomic. No fractures are identified. There is moderate severe disc space narrowing at L4-L5 and L5-S1 as well as L1-L2  similar to the prior study. There are atherosclerotic calcifications of the aorta.   IMPRESSION: 1. Status post posterior fusion at L2-L3. Alignment is anatomic. 2. Multilevel degenerative disc disease, similar to prior study.  PATIENT SURVEYS:  Modified Oswestry:  MODIFIED OSWESTRY DISABILITY SCALE Date:  05/11/2024 07/06/24  Pain intensity 2 =  Pain medication provides me with complete relief from pain. 2  2. Personal care (washing, dressing, etc.) 1 =  I can take care of myself normally, but it increases my pain. 2  3. Lifting 4 = I can lift only very light weights 4  4. Walking 1 = Pain prevents me from walking more than 1 mile. 3  5. Sitting 1 =  I can only sit in my favorite chair as long as I like. 1  6. Standing 2 =  Pain prevents me from standing more than 1 hour 3  7. Sleeping 2 =  Even when I take pain medication, I sleep less than 6 hours 1  8. Social Life 2 = Pain prevents me from participating in more energetic activities (eg. sports, dancing). 3  9. Traveling 2 =  My pain restricts my travel over 2 hours. 3  10. Employment/ Homemaking 2 = I can perform most of my homemaking/job duties, but pain prevents me from performing more physically stressful activities (eg, lifting, vacuuming). 2  Total 17 / 50 = 34.0 % 24 / 50 = 48.0 %  Interpretation Moderate disability Severe disability   Interpretation of scores: Score Category Description  0-20% Minimal Disability The patient can cope with most living activities. Usually no treatment is indicated apart from advice on lifting, sitting and exercise  21-40%  Moderate Disability The patient experiences more pain and difficulty with sitting, lifting and standing. Travel and social life are more difficult and they may be disabled from work. Personal care, sexual activity and sleeping are not grossly affected, and the patient can usually be managed by conservative means  41-60% Severe Disability Pain remains the main problem in this group,  but activities of daily living are affected. These patients require a detailed investigation  61-80% Crippled Back pain impinges on all aspects of the patient's life. Positive intervention is required  81-100% Bed-bound These patients are either bed-bound or exaggerating their symptoms  Bluford FORBES Zoe DELENA Karon DELENA, et al. Surgery versus conservative management of stable thoracolumbar fracture: the PRESTO feasibility RCT. [place] ([place]): [employer]; 2021 Nov. [facility] Technology Assessment, No. 25.62.) Appendix 3, Oswestry Disability Index category descriptors. Available from: Findjewel[employer]cz Minimally Clinically Important Difference (MCID) = 12.8%  LEFS  Extreme difficulty/unable (0), Quite a bit of difficulty (1), Moderate difficulty (2), Little difficulty (3), No difficulty (4) Survey date:  05/11/24 07/06/24   Any of your usual work, housework or school activities 1 2  2. Usual hobbies, recreational or sporting activities 2 2  3. Getting into/out of the bath 2 2  4. Walking between rooms 2 4  5. Putting on socks/shoes 2 4  6. Squatting  0 2  7. Lifting an object, like a bag of groceries from the floor 2 3  8. Performing light activities around your home 2 2  9. Performing heavy activities around your home 0 1  10. Getting into/out of a car 2 1  11. Walking 2 blocks 0 1  12. Walking 1 mile 2 0  13. Going up/down 10 stairs (1 flight) 2 3  14. Standing for 1 hour 2 1  15.  sitting for 1 hour 3 4  16. Running on even ground 0 0  17. Running on uneven ground 0 0  18. Making sharp turns while running fast 0 0  19. Hopping  0 0  20. Rolling over in bed 2 3  Score total:   26 / 80 = 32.5 % 35 / 80 = 43.8 %  Functional limitation: Moderate Moderate     SCREENING FOR RED FLAGS: Bowel or bladder incontinence: Yes: chronic bladder issues, bowel urgency - somewhat worse than before Spinal tumors: No Cauda equina syndrome: No Compression fracture:  No Abdominal aneurysm: No  COGNITION:  Overall cognitive status: Within functional limits for tasks assessed    SENSATION: WFL Except B distal LE neuropathy  POSTURE:  rounded shoulders, forward head, right pelvic obliquity, and scoliosis  PALPATION: Increased muscle tension in L>R lumbar paraspinals and R>L glutes and piriformis with TTP over inferior glutes and piriformis near hamstring origin  LUMBAR ROM: *Lumbar ROM assessed with UE support for balance and close SBA/CGA of PT  Active  Eval  Flexion Hands to distal shins  Extension 50% limited  Right lateral flexion Hand to upper thigh - p!  Left lateral flexion Hand to lateral knee  Right rotation 50% limited  Left rotation 30% limited - p!  (Blank rows = not tested)  MUSCLE LENGTH: Hamstrings: mild/mod tight B ITB: mild tight B Piriformis: mod/severe tight B Hip IR: mod tight B Hip flexors: mild tight R Quads: mild tight R>L Heelcord: mild/mod tight B  LOWER EXTREMITY ROM:    Grossly WFL other than limitations indicated above due to muscle tightness   LOWER EXTREMITY MMT:    MMT Right  eval Left eval R 07/06/24 L 07/06/24  Hip flexion 3+ 4- 4- 4  Hip extension 4- 4 4 4   Hip abduction 3+ 4- 4 4  Hip adduction 3+ 4- 4- 4  Hip internal rotation 4- 4- 4+ 4  Hip external rotation 3+ 3+ 4 4-  Knee flexion 4 4 4+ 4+  Knee extension 4 4 4+ 4+  Ankle dorsiflexion 3- 3- 3+ 3-  Ankle plantarflexion      Ankle inversion      Ankle eversion       (Blank rows = not tested)  FUNCTIONAL TESTS: (Assessment completed 05/14/24 unless otherwise indicated) 5 times sit to stand: 12.37 sec w/o UE assist - 06/01/24 Timed up and go (TUG): 13.75 sec with single hiking pole 10 meter walk test: 16.00 sec with single hiking pole Gait speed: 2.05 ft/sec with single hiking pole Berg Balance Scale: 42/56; 37-45 significant (>80%) - 05/19/24 Functional gait assessment: 10/30, < 19 = high risk fall    Standardized testing results as of  discharge from latest PT episode (11/11/23): 5xSTS = 11.84 sec  = 13.63 sec w/o AD Gait speed = 2.41 ft/sec w/o AD Berg = 45/56; scores of 37-45 indicate significant (>80%) fall risk  FGA = 19/30; scores of 19-24 indicate a medium risk for falls   GAIT: Distance walked: Clinic distances Assistive device utilized: Single walking/trekking pole on R Level of assistance: SBA Gait pattern: Increased sway, step through pattern, decreased stride length, trendelenburg, lateral hip instability, poor foot clearance- Right, and poor foot clearance- Left Comments: B foot slap   TODAY'S TREATMENT:   07/08/2024  THERAPEUTIC EXERCISE: To improve strength, endurance, ROM, and flexibility.  Demonstration, verbal and tactile cues throughout for technique.  Rec Bike - L4 x 3 min, L3 x 3 min - resistance level reduced due to increasing fatigue Supine manual stretches: R hamstring stretch x 30 R mod Thomas quad/hip flexor stretch x 30 R glute cross body stretch x 30 R ITB cross body stretch x 30 R KTOS piriformis stretch x 30 R figure-4 to chest piriformis stretch x 30  NEUROMUSCULAR RE-EDUCATION: To improve coordination, kinesthesia, and proprioception.  Hooklying R sciatic nerve glide x 10 - patient reporting feeling of less motion at toes during nerve glide Supine/long sitting R isometric toe curls into therapist's hand x 10 Attempted long sitting R toe curls into YTB however patient unable to create AROM against resistance band  MANUAL THERAPY: To promote normalized muscle tension, improved flexibility, and reduced pain utilizing connective tissue massage, therapeutic massage, manual TP therapy, and myofascial release.  STM/DTM, manual TPR, and percussive massage with massage gun to R distal glutes, piriformis, TFL, distal iliopsoas, vastus lateralis and ITB  MODALITIES: (non-billable) Moist heat pack to R glutes x 10' at end of session   07/06/2024 - Recert THERAPEUTIC EXERCISE: To  improve strength and endurance.   Rec Bike - L4 x 3 min, L3 x 3 min - resistance level reduced due to increasing fatigue  THERAPEUTIC ACTIVITIES: To improve functional performance.  Demonstration, verbal and tactile cues throughout for technique.  LE MMT - see above LE MMT table LEFS: 35/80 = 43.8% indicating moderate functional limitation Modified Oswestry: 24/50 = 48% or severe disability Gait speed = 2.08 ft/sec with single hiking pole Goal assessment  PHYSICAL PERFORMANCE TEST or MEASUREMENT: Functional Gait  Assessment  Gait Level Surface Walks 20 ft, slow speed, abnormal gait pattern, evidence for imbalance or deviates 10-15 in outside of the 12  in walkway width. Requires more than 7 sec to ambulate 20 ft.   Change in Gait Speed Makes only minor adjustments to walking speed, or accomplishes a change in speed with significant gait deviations, deviates 10-15 in outside the 12 in walkway width, or changes speed but loses balance but is able to recover and continue walking.   Gait with Horizontal Head Turns Performs head turns with moderate changes in gait velocity, slows down, deviates 10-15 in outside 12 in walkway width but recovers, can continue to walk.   Gait with Vertical Head Turns Performs task with slight change in gait velocity (eg, minor disruption to smooth gait path), deviates 6 - 10 in outside 12 in walkway width or uses assistive device   Gait and Pivot Turn Pivot turns safely in greater than 3 sec and stops with no loss of balance, or pivot turns safely within 3 sec and stops with mild imbalance, requires small steps to catch balance.   Step Over Obstacle Is able to step over one shoe box (4.5 in total height) without changing gait speed. No evidence of imbalance.   Gait with Narrow Base of Support Ambulates less than 4 steps heel to toe or cannot perform without assistance.   Gait with Eyes Closed Cannot walk 20 ft without assistance, severe gait deviations or imbalance,  deviates greater than 15 in outside 12 in walkway width or will not attempt task.   Ambulating Backwards Walks 20 ft, slow speed, abnormal gait pattern, evidence for imbalance, deviates 10-15 in outside 12 in walkway width.   Steps Alternating feet, must use rail.   Total Score 12   FGA comment: < 19 = high risk fall        07/01/2024  THERAPEUTIC EXERCISE: To improve strength, endurance, ROM, and flexibility.  Demonstration, verbal and tactile cues throughout for technique.  Rec Bike - L3 x 6 min Seated 3-way thoracolumbar flexion stretch 2 x 30 each position, using hiking pole for UE support Supine R sciatic nerve glides 2 x 10 bil Hooklying R figure-4 piriformis stretch x 30 Hooklying R KTOS piriformis stretch x 30 - pt noting increased groin anterior thigh discomfort Mod thomas R quad/hip flexor stretch 2 x 30 - 1 rep each w/o and with strap Supine R HS stretch with strap x 30 Supine R ITB stretch with strap x 30   MANUAL THERAPY: To promote normalized muscle tension, improved flexibility, improved joint mobility, and reduced pain utilizing connective tissue massage, therapeutic massage, manual TP therapy, and myofascial release.  STM/DTM and manual TPR to R lower glutes and piriformis   SELF CARE:  Provided education on self-STM/muscle release options and need to break up her travel to [place] with frequent stops to allow her to stretch and walk around.   MODALITIES: (non-billable) Moist heat pack to R glutes x 10' at end of session   06/24/2024 THERAPEUTIC EXERCISE: To improve strength, endurance, ROM, and flexibility.  Demonstration, verbal and tactile cues throughout for technique.  Rec Bike - L3 x 6 min Seated 3-way thoracolumbar flexion stretch 2 x 30 each position, using hiking pole for UE support, clarifying angle for lateral stretches Seated B foot/ankle arch raises x 10 Seated toe curls/towel scrunches - patient with difficulty achieving grip of towel with  toes therefore suggested patient try using a small object like a pen or pencil to work on grip with toes at home  NEUROMUSCULAR RE-EDUCATION: To improve coordination, kinesthesia, and reduce nerve irritation. Seated sciatic nerve  glides - difficulty coordinating movement  Supine sciatic nerve glides 2 x 10 bil Supine sciatic nerve glides with 90/90 knee flexion x 10 bil - patient reporting better response to previous exercise  THERAPEUTIC ACTIVITIES: To improve functional performance.  Demonstration, verbal and tactile cues throughout for technique. Unsupported mini-squat hovering over mat table with arms forward for balance (chair placed in front for safety with intermittent support) x 10 - attempted in bare feet today as patient feeling like she is losing proprioceptive awareness and control in her feet, however still demonstrating frequent loss of eccentric control/uncontrolled descent onto mat table   06/22/2024 THERAPEUTIC EXERCISE: To improve strength, endurance, and flexibility.  Demonstration, verbal and tactile cues throughout for technique.  Rec Bike - L3 x 6 min Seated 3-way thoracolumbar flexion stretch 2 x 30 each position using hiking pole for UE support  THERAPEUTIC ACTIVITIES: To improve functional performance.  Demonstration, verbal and tactile cues throughout for technique. Unsupported mini-squat hovering over mat table with arms forward for balance (chair placed in front for safety with intermittent support) x 10 - more frequent loss of eccentric control/uncontrolled descent onto mat table Unsupported mini-squat with glute touch touch to rounded bolster on mat table with arms forward for balance (chair placed in front for safety with intermittent support) x 10  NEUROMUSCULAR RE-EDUCATION: To improve balance, coordination, kinesthesia, posture, proprioception, and amplitude of movement.  S/L R hip ABD/ext diagonals 2 x 5 - L knee flexed for increased ant/post stability to  prevent pt from rolling backward & PT guiding motion for 1st few reps; repeated x 10 with L LE  S/L R hip circumduction arc over L LE tapping R foot in front and behind L foot x 5 - very limited eccentric control with posterior tap; repeated x 10 with L LE   06/08/2024 THERAPEUTIC EXERCISE: To improve strength, endurance, ROM, and flexibility.  Demonstration, verbal and tactile cues throughout for technique. Rec Bike - L3 x 6 min Seated 3-way thoracolumbar flexion stretch 2 x 30 each position - 1st reps with hands on green Pball, 2nd reps using hiking pole for UE support Quadruped 3-way child's pose x 30 each  THERAPEUTIC ACTIVITIES: To improve functional performance.  Demonstration, verbal and tactile cues throughout for technique. Reviewed proper lifting mechanics using squatting motion and golfer's lift  Unsupported mini-squat hovering over mat table with arms forward for balance (chair placed in front for safety but not needed for support) x 8  SELF CARE:  Provided education on managing muscle cramping through stretching, increased hydration, and potential Mg supplement (instructed pt to verify with PCP that there are no concerns or potential medicine interactions with trying Mg supplement).   06/03/2024  THERAPEUTIC EXERCISE: To improve strength, endurance, and flexibility.  Demonstration, verbal and tactile cues throughout for technique.  Rec Bike - L3 x 6 min Standing lateral lean R ITB over back of chair 2 x 30 Supine ITB stretch with strap 3 x 30 bil  MANUAL THERAPY: To promote normalized muscle tension, improved flexibility, and reduced pain utilizing connective tissue massage, therapeutic massage, manual TP therapy, and myofascial release.  STM/DTM, IASTM with foam roller and manual TPR to R distal glutes, HS and ITB  NEUROMUSCULAR RE-EDUCATION: To improve coordination, kinesthesia, posture, proprioception, and amplitude of movement.  S/L R hip ABD/ext diagonals x 10 - slight  PT assist initially to stabilize hips to prevent anterior/posterior rotation & VC/TC to avoid hip ER S/L R hip circumduction arc over L LE tapping R  foot in front and behind L foot x 5 - very limited eccentric control with posterior tap   SELF CARE:  HEP update Provided instruction in self-STM techniques to R ITB using rolling pin.    06/01/2024 THERAPEUTIC EXERCISE: To improve strength and endurance.  Demonstration, verbal and tactile cues throughout for technique. NuStep - L4 x 6 min (LE only)  THERAPEUTIC ACTIVITIES: To improve functional performance.  Demonstration, verbal and tactile cues throughout for technique. 5xSTS = 12.37 sec w/o UE assist, somewhat uncontrolled descent esp on last rep  TRIGGER POINT DRY NEEDLING: Treatment instructions/education: Initial Treatment: Pt instructed on Dry Needling rational, procedures, and possible side effects. Pt instructed to expect mild to moderate muscle soreness later in the day and/or into the next day.  Pt instructed in methods to reduce muscle soreness. Pt instructed to continue prescribed HEP. Patient was educated on signs and symptoms of infection and other risk factors and advised to seek medical attention should they occur.  Patient verbalized understanding of these instructions and education.  Education Handout Provided: Yes Consent: Patient Verbal Consent Given: Yes Treatment: Muscles Treated: R medial and lateral piriformis, R glute medius Utilized skilled palpation to identify bony landmarks and trigger points along with monitoring of soft tissue during DN. Electrical Stimulation Performed: No Treatment Response/Outcome: Twitch response elicited, Palpable increase in muscle length, Decreased tissue resistance noted, Decreased pain/TTP, and Improved exercise tolerance  MANUAL THERAPY: To promote normalized muscle tension, improved flexibility, and reduced pain. STM/DTM, manual TPR and pin & stretch to muscles addressed with DN    NEUROMUSCULAR RE-EDUCATION: To improve balance, coordination, kinesthesia, posture, proprioception, and amplitude of movement. Mini-squat with counter support x 10   05/19/24 THERAPEUTIC EXERCISE: To improve ROM and flexibility.  Demonstration, verbal and tactile cues throughout for technique.  Bike L3x38min Standing runner stretch x 30 BLE Standing soleus stretch x 30 BLE Table piriformis stretch 2x30 RLE Supine L figure 4 stretch 2x30 LLE Supine glute stretch 2x30 BLE Supine KTOS stretch 2x30 BLE  Seated clams with GTB + trA x 10 BLE Seated marching GTB x + TrA 10 BLE Berg Balance Test  Sit to Stand Able to stand without using hands and stabilize independently   Standing Unsupported Able to stand 2 minutes with supervision   Sitting with Back Unsupported but Feet Supported on Floor or Stool Able to sit safely and securely 2 minutes   Stand to Sit Sits safely with minimal use of hands   Transfers Able to transfer safely, minor use of hands   Standing Unsupported with Eyes Closed Able to stand 10 seconds with supervision   Standing Unsupported with Feet Together Able to place feet together independently and stand 1 minute safely   From Standing, Reach Forward with Outstretched Arm Can reach confidently >25 cm (10)   From Standing Position, Pick up Object from Floor Able to pick up shoe safely and easily   From Standing Position, Turn to Look Behind Over each Shoulder Looks behind one side only/other side shows less weight shift   Turn 360 Degrees Able to turn 360 degrees safely but slowly   Standing Unsupported, Alternately Place Feet on Step/Stool Able to complete 4 steps without aid or supervision   Standing Unsupported, One Foot in Front Needs help to step but can hold 15 seconds   Standing on One Leg Unable to try or needs assist to prevent fall   Total Score 42       05/14/2024  THERAPEUTIC ACTIVITIES: To improve  functional performance.  Demonstration, verbal and  tactile cues throughout for technique.  5xSTS: Deferred due to increased pain with uncontrolled descent upon sitting TUG: 13.75 sec with single hiking pole : 16.00 sec with single hiking pole Gait speed: 2.05 ft/sec with single hiking pole  PHYSICAL PERFORMANCE TEST or MEASUREMENT: Functional Gait  Assessment  Gait Level Surface Walks 20 ft, slow speed, abnormal gait pattern, evidence for imbalance or deviates 10-15 in outside of the 12 in walkway width. Requires more than 7 sec to ambulate 20 ft.   Change in Gait Speed Makes only minor adjustments to walking speed, or accomplishes a change in speed with significant gait deviations, deviates 10-15 in outside the 12 in walkway width, or changes speed but loses balance but is able to recover and continue walking.   Gait with Horizontal Head Turns Performs head turns with moderate changes in gait velocity, slows down, deviates 10-15 in outside 12 in walkway width but recovers, can continue to walk.   Gait with Vertical Head Turns Performs task with moderate change in gait velocity, slows down, deviates 10-15 in outside 12 in walkway width but recovers, can continue to walk.   Gait and Pivot Turn Turns slowly, requires verbal cueing, or requires several small steps to catch balance following turn and stop   Step Over Obstacle Is able to step over one shoe box (4.5 in total height) without changing gait speed. No evidence of imbalance.   Gait with Narrow Base of Support Ambulates less than 4 steps heel to toe or cannot perform without assistance.   Gait with Eyes Closed Cannot walk 20 ft without assistance, severe gait deviations or imbalance, deviates greater than 15 in outside 12 in walkway width or will not attempt task.   Ambulating Backwards Walks 20 ft, slow speed, abnormal gait pattern, evidence for imbalance, deviates 10-15 in outside 12 in walkway width.   Steps Alternating feet, must use rail.   Total Score 10   FGA comment: < 19 = high  risk fall      THERAPEUTIC EXERCISE: To improve ROM and flexibility.  Demonstration, verbal and tactile cues throughout for technique.  Hooklying R HS stretch with strap hamstring 2 x 30 Supine gluteus stretch 2 x 30 bil Hooklying figure-4 piriformis stretch x 30 on L, deferred d/t increased pain on R Hooklying KTOS piriformis stretch x 30 on L, deferred d/t increased pain on R Side-sitting R modified pigeon piriformis stretch 2 x 30 - good stretch noted without increased pain Seated R KTOS piriformis stretch 2 x 30 - good stretch noted without increased pain  SELF CARE:   Modified HEP to address concerns regarding increased R buttock/hip pain with piriformis stretching, with patient reporting better tolerance for seated piriformis stretching.  Patient cautioned not to force any painful ROM/stretching and report back to PT next visit if HEP continues to irritate or cause increased pain. Provided instruction in self-STM techniques to R glutes/piriformis using tennis ball against wall or in chair.    05/04/2024  SELF CARE:  Reviewed eval findings and role of PT in addressing identified deficits as well as instruction in initial HEP (see below).    PATIENT EDUCATION:  Education details: continue with current HEP  Person educated: Patient Education method: Explanation Education comprehension: verbalized understanding  HOME EXERCISE PROGRAM: Access Code: [facility] URL: [email or web address] Date: 06/24/2024 Prepared by: Elijah Hidden  Exercises - Standing Soleus Stretch  - 1 x daily - 7 x weekly - 3 sets - 3  reps - 30 sec hold - Hooklying Hamstring Stretch with Strap  - 2 x daily - 7 x weekly - 3 reps - 30 sec hold - Supine Gluteus Stretch  - 2 x daily - 7 x weekly - 3 reps - 30 sec hold - Supine Figure 4 Piriformis Stretch  - 2 x daily - 7 x weekly - 3 reps - 30 sec hold - Supine Piriformis Stretch with Foot on Ground  - 2 x daily - 7 x weekly - 3 reps - 30 sec  hold - Seated Table Piriformis Stretch  - 2 x daily - 7 x weekly - 3 reps - 30 sec hold - Seated Piriformis Stretch  - 2 x daily - 7 x weekly - 3 reps - 30 sec hold - Seated March with Resistance  - 1 x daily - 7 x weekly - 2 sets - 10 reps - Seated Isometric Hip Abduction with Resistance  - 1 x daily - 7 x weekly - 2 sets - 10 reps - Glute Max Release With [employer] Against Wall  - 1 x daily - 7 x weekly - 1-2 min hold - Mini Squat with Counter Support  - 1 x daily - 3 x weekly - 2 sets - 10 reps - 3-5 sec hold - Standing ITB Stretch (Mirrored)  - 2 x daily - 7 x weekly - 3 reps - 30 sec hold - Supine Iliotibial Band Stretch with Strap  - 2 x daily - 7 x weekly - 3 reps - 30 sec hold - Sidelying Diagonal Hip Abduction  - 1 x daily - 3 x weekly - 2 sets - 10 reps - 3 sec hold - Sidelying Hip Abduction and Extension (Mirrored)  - 1 x daily - 3 x weekly - 2 sets - 5 reps - 3 sec hold - Seated 3 Way Exercise Ball Roll Out Stretch  - 1 x daily - 7 x weekly - 2 sets - 3 reps - 30 sec hold - Mini Squat with Chair  - 1 x daily - 3 x weekly - 2 sets - 10 reps - 3 sec hold - Supine Sciatic Nerve Glide  - 1 x daily - 7 x weekly - 2 sets - 10 reps - 3 sec hold  Patient Education - Lifting Techniques   ASSESSMENT:  CLINICAL IMPRESSION: [name] Trujillo reports a new flareup of her pain occurring while walking across a level room yesterday, with pain now affecting R inferior buttock as well as anterior hip/proximal thigh.  And performing her HEP to try to mitigate pain she also notes decreasing control of R active toe flexion which she feels like is affecting her balance as it limits her ability to grip the ground with her toes.  She was able to demonstrate partial AROM in R great toe with full AROM of lesser toes although unable to generate much force when PT attempting to resist motion manually or with YTB.  Increased muscle tension and TPs identified in glutes and piriformis as well as TFL and proximal VL  today along with TTP over R ITB - addressed with MT incorporating manual TPR and percussive massage with massage gun followed by manual stretching by PT with patient noting some relief of pain, however pain recurring when she attempts to return to standing and take steps.  Session concluded with moist heat application to promote further muscle relaxation and decreased pain.  Kionna will benefit from continued skilled PT to address ongoing  flexibility, strength and balance deficits deficits to improve mobility and activity tolerance with decreased pain interference and decreased risk for falls.    EVAL: [name] Trujillo is a 83 y.o. female who was referred to physical therapy for evaluation and treatment for R hip/buttock pain presumed to be the result of lumbar radiculopathy.  Patient reports onset of R buttock pain beginning Memorial Day weekend 2025.  Pain is worse with walking and positional changes.  Patient has deficits in hip ROM, proximal LE flexibility, B LE strength, abnormal posture, and TTP with abnormal muscle tension in R lower glutes and piriformis which are interfering with ADLs and are impacting quality of life.  On Modified Oswestry patient scored 17/50 demonstrating 34% or moderate disability.  On LEFS patient scored 26/80 demonstrating moderate functional limitation.  Given her significant LE weakness and impaired gait pattern, will plan more formalized balance testing next visit.  [name] Trujillo will benefit from skilled PT to address above deficits to improve mobility and activity tolerance with decreased pain interference.     OBJECTIVE IMPAIRMENTS: Abnormal gait, decreased activity tolerance, decreased balance, decreased knowledge of condition, decreased mobility, difficulty walking, decreased ROM, decreased strength, increased fascial restrictions, impaired perceived functional ability, increased muscle spasms, impaired flexibility, impaired sensation, improper body mechanics, postural  dysfunction, and pain.   ACTIVITY LIMITATIONS: carrying, lifting, bending, standing, squatting, sleeping, stairs, transfers, bed mobility, bathing, toileting, dressing, reach over head, hygiene/grooming, locomotion level, and caring for others  PARTICIPATION LIMITATIONS: meal prep, cleaning, laundry, driving, shopping, community activity, yard work, and church  PERSONAL FACTORS: Age, Past/current experiences, Time since onset of injury/illness/exacerbation, and 3+ comorbidities: L2-3 XLIF/PSF 05/24/22; C5-7 ACDF 09/21/20; idiopathic progressive neuropathy; hypthyroidism; DDD; remote h/o back surgery at age 61; h/o scoliosis; 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   REHAB POTENTIAL: Good  CLINICAL DECISION MAKING: Unstable/unpredictable  EVALUATION COMPLEXITY: High   GOALS: Goals reviewed with patient? Yes  SHORT TERM GOALS: Target date: 06/15/2024  Patient will be independent with initial HEP to improve outcomes and carryover.  Baseline: Initial HEP provided on eval Goal status: MET - 05/19/24  2.  Patient will report 25% improvement in low back pain to improve QOL. Baseline: 2/10 on eval, up to 10/10 06/03/24 - improvement in pain noted by end of today's session Goal status: MET - 06/08/24 - Pt reports 50+% improvement in pain thus far  3.  Complete standardized balance assessment and update LTG's as indicated. Baseline:  05/14/24 - , TUG and FGA completed; Berg and 5xSTS pending  05/19/24 GLENWOOD Levins completed Goal status: MET - 06/01/24 - 5xSTS completed  LONG TERM GOALS: Target date: 07/20/2024, extended to 08/31/2024  Patient will be independent with ongoing/advanced HEP for self-management at home.  Baseline:  06/22/24 - HEP reviewed today Goal status: IN PROGRESS - 07/06/24 - no concerns with current HEP  2.  Patient will report  50-75% improvement in R hip/pelvic pain to improve QOL.  Baseline: 2/10 on eval, up to 10/10 06/08/24 - Pt reports 50+% improvement in pain thus far Goal status: IN PROGRESS - 07/06/24 - 50-60% improvement overall, but still having periods of more intense pain  3.  Patient will demonstrate improved B proximal LE strength to >/= 4 to 4+/5 for improved stability and ease of mobility. Baseline: Refer to above LE MMT table Goal status: IN PROGRESS - 07/06/24   4.  Patient will report >/= 35/80 on  LEFS (MCID = 9 pts) to demonstrate improved functional ability. Baseline: 26 / 80 = 32.5 % Goal status: MET - 07/06/24 - 35 / 80 = 43.8 %  5. Patient will report </= 22% on Modified Oswestry (MCID = 12%) to demonstrate improved functional ability with decreased pain interference. Baseline: 17 / 50 = 34.0 % Goal status: IN PROGRESS - 07/06/24 - 24 / 50 = 48.0 %  6.  Patient will resume her normal walking routine w/o increased pain to allow for  improved mobility and activity tolerance. Baseline: R ischial pain limiting walking tolerance Goal status: IN PROGRESS - 07/06/24 - attempted to try walking in her neighborhood last week but experienced increased pain while trying to walk up a incline that caused her to have to stop and return home   7.  Patient will improve Berg score by at least 8 points or to >/= 45/56 (baseline as of prior D/C from PT) to improve safety and stability with ADLs in standing and reduce risk for falls.  Baseline: 42/56 (05/19/24) Goal status: IN PROGRESS   8.  Patient will improve FGA score by at least 4 points or to >/= 19/30 (baseline as of prior D/C from PT) to improve gait stability and reduce risk for falls.   Baseline: 10/30 (05/14/24) Goal status: IN PROGRESS - 07/06/24 - 12/30  9.  Patient will improve gait velocity to at least 2.62 ft/sec for improved gait efficiency and safety with community ambulation.  Baseline: 2.05 ft/sec with single hiking pole (05/14/24) Goal  status: IN PROGRESS - 07/06/24 - 2.08 ft/sec with single hiking pole   PLAN:  PT FREQUENCY: 2x/week  PT DURATION: 8 weeks  PLANNED INTERVENTIONS: 97164- PT Re-evaluation, 97750- Physical Performance Testing, 97110-Therapeutic exercises, 97530- Therapeutic activity, 97112- Neuromuscular re-education, 97535- Self Care, [id number]- Manual therapy, [phone]- Gait training, [phone]- Electrical stimulation (unattended), 97035- Ultrasound, 02966- Ionotophoresis 4mg /ml Dexamethasone, 79439 (1-2 muscles), [id number] (3+ muscles)- Dry Needling, Patient/Family education, Balance training, Stair training, Taping, Joint mobilization, DME instructions, Cryotherapy, and Moist heat  PLAN FOR NEXT SESSION: stretch progression as indicated; progress gentle lumbopelvic/proximal LE strengthening as pain allows; MT +/- TPDN to address abnormal muscle tension in glutes and piriformis; potential trial of TENS unit if pain persisting; balance training to decrease fall risk   Elijah CHRISTELLA Hidden, PT 07/08/2024, 6:02 PM

## 2024-07-12 ENCOUNTER — Encounter: Payer: Self-pay | Admitting: Radiology

## 2024-07-12 NOTE — Progress Notes (Signed)
 [facility] Visit Note 07/12/24    CHIEF COMPLAINT Patient presents for Post-op Exam  HISTORY OF PRESENT ILLNESS: [name] Trujillo is a 83 y.o. female who presents to the clinic today for:  HPI   Pt here today for 5 week P.O. S/P CE/IOL right eye x 06/09/24 (left eye x 05/04/24)  C/o frequent itching/irritation and dryness left eye. States right eye sees more clear and sharper than left eye. States left eye va seems milky and foggy compared to right eye. Like a film over left eye at times per pt. Denies pain. Right eye stable per pt.  Using PF Refresh gtts 5-6 x a day left eye. Ran out of Pred gtts BID right eye yesterday.   Last edited by Alan Lyle Faster, COT on 07/12/2024  9:38 AM.    CURRENT MEDICATIONS: Medications Ordered Prior to Encounter[1] Referring physician: No referring provider defined for this encounter.  ALLERGIES Allergies[2]  PAST MEDICAL HISTORY Medical History[3] Surgical History[4] FAMILY HISTORY Family History[5] SOCIAL HISTORY Social History[6]   OPHTHALMIC EXAM:  Base Eye Exam     Visual Acuity (Snellen - Linear)       Right Left   Dist [place] 20/25-2 20/25-1         Tonometry (Applanation, 9:52 AM)       Right Left   Pressure 16 15         Pupils       Pupils Dark Light APD   Right PERRL 3 2 None   Left PERRL 3 2 None         Neuro/Psych     Oriented x3: Yes   Mood/Affect: Normal         Dilation     Both eyes: 2.5% Phenylephrine, 1.0% Tropicamide @ 9:52 AM           Slit Lamp and Fundus Exam     External Exam       Right Left   External Normal Normal         Slit Lamp Exam       Right Left   Lids/Lashes Dermatochalasis - upper lid Dermatochalasis - upper lid, plugged up gland LL   Conjunctiva/Sclera White and quiet White and quiet   Cornea Faint pannus at 5, map dot dystrophy IT, Tear film insufficiency Map dot dystrophy IT, Tear Film Insufficiency   Anterior Chamber  Deep and quiet Deep and quiet   Iris Round Round   Lens PCIOL PCIOL         Fundus Exam       Right Left   Vitreous PVD PVD   Disc temp PPA trace temp pigment   C/D Ratio 0.4 0.3   Macula Normal Normal   Vessels Normal Normal   Periphery Normal Normal           Refraction     Wearing Rx       Sphere   Right +3.25   Left +3.25    Type: readers         Manifest Refraction       Sphere Cylinder Axis Dist VA Add [place]   Right +0.25 -1.00 085 20/20 +2.75 J1+   Left +0.50 -0.75 130 20/20 +2.75 J1+         Final Rx       Sphere Cylinder Axis Dist VA Add [place]   Right +0.25 -1.00 085 20/20 +2.75 J1+   Left +0.50 -0.75 130 20/20 +  2.75 J1+    Expiration Date: 07/12/2026   Comments: Remark: UV filter, Polycarbonate, Tint of choice Doctor Recommendations:Anti-Reflective   Recent cataract surgery with IOL both eyes.  Dates of surgery: Right eye x 06/09/24 Left eye x 05/04/24          ASSESSMENT/PLAN:  1. 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 Dry eye syndrome of both eyes      4. Astigmatism of both eyes with presbyopia       S/p bilateral cataract surgeries, Right eye: 06/09/2024. Left eye: 05/04/2024. Doing great. Remain off surgery drops.   EBMD / Dry Eye Syndrome OU: Discussed with pt. Use Artifical Tears 3 times daily. Start Muro ointment at bedtime.   Refractive Error OU: Dispensed new glasses Rx today.    Medication ordered this visit:  No orders of the defined types were placed in this encounter.    Return in about 1 year (around 07/12/2025) for CEE.   Patient Instructions   Start Muro ointment - apply a small amount into both eyes at bedtime. This will make your vision blurry so try to use right before you go to sleep.    Use artificial tears one drop 3 times a day in both eyes (about breakfast, lunch and dinner).  Okay to use more than 4 times a day if you are using  preservative free artificial tears.  Some good brands include: Systane, Refresh, TheraTears, Genteal, Blink, or Soothe. PLEASE AVOID STORE BRAND EYE DROPS.     Please protect your eyes from the sun. Keep recommended follow up appointment and call with any concerns or changes.    --------------------------------------------------------------------------------- Explained the diagnoses, plan, and follow up with the patient and they expressed understanding.  Patient expressed understanding of the importance of proper follow up care.   This document serves as a record of services personally performed by Ozell Sar, M.D.  It was created on their behalf by Veleria LITTIE People, COT, a trained medical scribe, and Certified Ophthalmic Tech (COT). During the course of documenting the history, physical exam and medical decision making, I was functioning as a stage manager. The creation of this record is the provider's dictation and/or activities during the visit.  Abbreviations: M myopia (nearsighted); A astigmatism; H hyperopia (farsighted); P presbyopia; Mrx spectacle prescription;  CTL contact lenses; OD right eye; OS left eye; OU both eyes  XT exotropia; ET esotropia; PEK punctate epithelial keratitis; PEE punctate epithelial erosions; DES dry eye syndrome; MGD meibomian gland dysfunction; ATs artificial tears; PFAT's preservative free artificial tears; NSC nuclear sclerotic cataract; PSC posterior subcapsular cataract; ERM epi-retinal membrane; PVD posterior vitreous detachment; RD retinal detachment; DM diabetes mellitus; DR diabetic retinopathy; NPDR non-proliferative diabetic retinopathy; PDR proliferative diabetic retinopathy; CSME clinically significant macular edema; DME diabetic macular edema; DBH dot blot hemorrhages; CWS cotton wool spot; POAG primary open angle glaucoma; C/D cup-to-disc ratio; HVF humphrey visual field; GVF goldmann visual field; OCT optical coherence tomography; IOP intraocular  pressure; BRVO Branch retinal vein occlusion; CRVO central retinal vein occlusion; CRAO central retinal artery occlusion; BRAO branch retinal artery occlusion; RT retinal tear; SB scleral buckle; PPV pars plana vitrectomy; VH Vitreous hemorrhage; PRP panretinal laser photocoagulation; IVK intravitreal kenalog; VMT vitreomacular traction; MH Macular hole;  NVD neovascularization of the disc; NVE neovascularization elsewhere; AREDS age related eye disease study; ARMD age related macular degeneration; POAG primary open angle glaucoma; EBMD epithelial/anterior basement membrane  dystrophy; ACIOL anterior chamber intraocular lens; IOL intraocular lens; PCIOL posterior chamber intraocular lens; Phaco/IOL phacoemulsification with intraocular lens placement; PRK photorefractive keratectomy; LASIK laser assisted in situ keratomileusis; HTN hypertension; DM diabetes mellitus; COPD chronic obstructive pulmonary disease   Electronically signed by: Ozell Lemond Sar, MD 07/12/2024 2:13 PM        [1] Current Outpatient Medications on File Prior to Visit  Medication Sig Dispense Refill  . acetaminophen  (TYLENOL ) 325 mg tablet Take 975 mg by mouth.    . cyanocobalamin (VITAMIN B12) 1,000 mcg tablet Take 1,000 mcg by mouth daily.    . Eliquis  2.5 mg tab Take 1 tablet by mouth in the morning and 1 tablet in the evening.    . gabapentin (NEURONTIN) 600 mg tablet take 1/4 to 1 tablet by mouth at bedtime as directed if needed for RESTLESS LEG SYNDROME  0  . levothyroxine  (SYNTHROID ) 100 mcg tablet Take 88 mcg by mouth every morning.    . losartan  (COZAAR ) 25 mg tablet Take 12.5 mg by mouth daily.    . metoprolol  succinate (TOPROL  XL) 25 mg 24 hr tablet Take 12.5 mg by mouth daily.    . pravastatin (PRAVACHOL) 20 mg tablet take 1 tablet by mouth once daily at bedtime after meals  0  . Repatha  SureClick 140 mg/mL pnij     . Vesicare 10 mg tablet   0  . [DISCONTINUED] prednisoLONE acetate (PRED FORTE) 1 % ophthalmic  suspension Instill 1 drop in the LEFT eye 4 times a day. Start the drops 3 hours after you arrive home from surgery. (Patient not taking: Reported on 07/12/2024) 5 mL 2   No current facility-administered medications on file prior to visit.  [2] Allergies Allergen Reactions  . Atorvastatin  Other (See Comments)    myalgias  . Ezetimibe Other (See Comments)    myalgias  . Oxycodone Hcl GI Intolerance  . Primidone Other (See Comments)  . Propranolol Other (See Comments)  . Rosuvastatin  Calcium  Other (See Comments)  . Tolterodine Other (See Comments)  . Hydrocodone GI Intolerance  [3] Past Medical History: Diagnosis Date  . Cardiomyopathy 01/07/2022  . 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 Hyperlipidemia   . Hypertension    controlled with medications  . Hypothyroidism   . Map-dot-fingerprint corneal dystrophy   . Overactive bladder   . PVD (posterior vitreous detachment), right eye   [4] Past Surgical History: Procedure Laterality Date  . BACK SURGERY  1971   Procedure: BACK SURGERY  . CARDIAC CATHETERIZATION  01/07/2022  . CATARACT EXTRACTION W/  INTRAOCULAR LENS IMPLANT Left 05/04/2024   Ozell Sar, MD  [id number]  13.5D  . CATARACT EXTRACTION W/  INTRAOCULAR LENS IMPLANT Right 06/09/2024   Ozell Sar, MD  [id number] 12.5D  . CATARACT EXTRACTION W/ INTRAOCULAR LENS IMPLANT Left 05/04/2024   EXTRACTION CATARACT LEFT EYE WITH INTRAOCULAR LENS INSERTION performed by Ozell Lemond Sar, MD at [facility]  . CATARACT EXTRACTION W/ INTRAOCULAR LENS IMPLANT Right 06/09/2024   EXTRACTION CATARACT RIGHT EYE WITH INTRAOCULAR LENS INSERTION performed by Ozell Lemond Sar, MD at [facility]  . CERVICAL FUSION  2022  . HYSTERECTOMY   2008   Procedure: HYSTERECTOMY  . LUMBAR FUSION  2023  [5] Family History Problem Relation Name Age of Onset  . Blindness Neg Hx    . Glaucoma Neg Hx    . Macular degeneration Neg Hx    [6] Social History Tobacco Use  . Smoking status: Never  .  Smokeless  tobacco: Never  Vaping Use  . Vaping status: Never Used  Substance Use Topics  . Alcohol use: Yes    Comment: very rarely  . Drug use: Never

## 2024-07-13 ENCOUNTER — Ambulatory Visit: Attending: Orthopaedic Surgery | Admitting: Physical Therapy

## 2024-07-13 ENCOUNTER — Encounter: Payer: Self-pay | Admitting: Physical Therapy

## 2024-07-13 DIAGNOSIS — M5416 Radiculopathy, lumbar region: Secondary | ICD-10-CM | POA: Insufficient documentation

## 2024-07-13 DIAGNOSIS — M25551 Pain in right hip: Secondary | ICD-10-CM | POA: Insufficient documentation

## 2024-07-13 DIAGNOSIS — R2689 Other abnormalities of gait and mobility: Secondary | ICD-10-CM | POA: Insufficient documentation

## 2024-07-13 DIAGNOSIS — R252 Cramp and spasm: Secondary | ICD-10-CM | POA: Insufficient documentation

## 2024-07-13 DIAGNOSIS — M6281 Muscle weakness (generalized): Secondary | ICD-10-CM | POA: Insufficient documentation

## 2024-07-13 DIAGNOSIS — R2681 Unsteadiness on feet: Secondary | ICD-10-CM | POA: Insufficient documentation

## 2024-07-13 NOTE — Therapy (Signed)
 OUTPATIENT PHYSICAL THERAPY TREATMENT      Patient Name: [name] Trujillo MRN: [id number] DOB:[birth year], 83 y.o., female Today's Date: 07/13/2024  END OF SESSION:  PT End of Session - 07/13/24 1445     Visit Number 12    Date for Recertification  08/31/24    Authorization Type [employer]    Authorization Time Period 07/07/24 - 08/31/24    Authorization - Visit Number 2    Authorization - Number of Visits 8    Progress Note Due on Visit 20    PT Start Time 1445    PT Stop Time 1536    PT Time Calculation (min) 51 min    Activity Tolerance Patient tolerated treatment well    Behavior During Therapy [facility] for tasks assessed/performed              Past Medical History:  Diagnosis Date   High cholesterol    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   She had ACUTE TAKOTSUBO (STRESS) CARDIOMYOPATHY with elevated Troponin Levels - this would be considered Demand Ischemia - Demand Infarction & NOT associated with ACS/CAD.     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  - Cath with NO CAD. ==> RESOLVED: f/u Echo 03/2022: EF 60-65%.   Past Surgical History:  Procedure Laterality Date   APPEN[age]CTOMY     51-18 yo   BACK SURGERY     Age 89   CESAREAN SECTION     ECTOPIC PREGNANCY SURGERY     LAPAROSCOPIC HYSTERECTOMY     LEFT HEART CATH AND CORONARY ANGIOGRAPHY N/A 01/09/2022   Procedure: LEFT HEART CATH AND CORONARY ANGIOGRAPHY;  Surgeon: Wonda Sharper, MD;  Location: [facility] INVASIVE CV LAB;  Service: CV:: Widely patent coronaries with mild nonobstructive LAD plaquing.  Right dominant system.  Normal LVEDP.  Based on clinical presentation, findings are consistent with acute Takotsubo  Cardiomyopathy Syndrome   POSTERIOR FUSION LUMBAR SPINE  05/24/2022   [facility], [place]; [name], MD): L2-L3 XLIF, L2-L3 POSTERIOR DECOMPRESSION AND FUSION   TRANSTHORACIC ECHOCARDIOGRAM  01/08/2022   Severely decreased LV function-EF 25 to 30%.  Mid to apical (mostly anterior) with normal basal motion.  GR 2 DD-elevated LAP.  Mildly dilated LA.  Aortic sclerosis with no stenosis.  No AI.  Normal MV with mild to moderate TR.  Mildly elevated RAP, and PAP (estimated 49 mg). If LAD CAD ruled out - consistent with Takotsubo CM Syndrome.   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) 12/30/2023   New onset atrial fibrillation (HCC) 12/30/2023   Myopia of both eyes with astigmatism and presbyopia 05/06/2023   Dry eye syndrome of both eyes 05/06/2023   Mild tricuspid regurgitation 05/06/2022   Mild mitral regurgitation 05/06/2022   Hypertension 05/06/2022   History of seizure 05/06/2022   Takotsubo cardiomyopathy-resolved 01/10/2022  Hyperlipidemia with target LDL less than 100 01/10/2022   Hypothyroid 01/10/2022   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) 01/08/2022   Numbness in both legs 04/10/2021   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   Posterior vitreous detachment of both eyes 02/05/2018   Ventricular premature beats 10/31/2015   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   Pure hypercholesterolemia 06/28/2014   Plantar fasciitis 06/28/2014    PCP: Windy Coy, MD   REFERRING PROVIDER: Vernetta Lonni GRADE, MD  REFERRING DIAG:  [phone] (ICD-10-CM) - Pain in right hip   M54.50 (ICD-10-CM) - Low back pain, unspecified  Eval and treat low back/right pelvis and right hip/ishium Any modalities per therapist   THERAPY DIAG:  Radiculopathy, lumbar region  Pain in right hip  Muscle weakness (generalized)  Cramp and spasm  Other abnormalities of gait and mobility  Unsteadiness on feet  RATIONALE FOR EVALUATION AND TREATMENT: Rehabilitation  ONSET DATE: Memorial Day weekend  NEXT MD VISIT: 07/28/2024    SUBJECTIVE:                                                                                                                                                                                                         SUBJECTIVE STATEMENT: Pt reports she tried to go for a walk with her friend yesterday but only made it to the end of her driveway before the pain got so bad (6-7/10) that she had to have her husband come out and help her back into the house.  She had been walking around in the house before going outside w/o any pain.  EVAL: [name] Trujillo is well know to me from several prior PT episodes for foot drop, cervical and lumbar radiculopathy.  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  Woke up the next morning with severe R hip/buttock pain.  Pain mostly localized to sciatic and ischial area of her R buttock.  PCP provider told her it was likely hip bursitis but ortho MD feels like it is more likely radicular from her back.  Some relief from steroid injection around her right hip ischial area provided by ortho MD but pain has never fully gone away.   She does have a significant lurch in her gait which is somewhat more pronounced than on prior PT episodes with this PT.  PAIN: Are you having pain? No and Yes: NPRS scale: 2/10 up to 4/10  Pain location: R lower buttock  Pain description: constant  Aggravating factors: walking, certain sitting positions, bending over to tie her  shoes  Relieving factors: Theraworx cream   PERTINENT HISTORY:  L2-3 XLIF/PSF 05/24/22; C5-7 ACDF 09/21/20; idiopathic progressive neuropathy; hypthyroidism; DDD; remote h/o back surgery at age 49; h/o scoliosis; Takotsubo cardiomyopathy 01/10/22; CAD; paroxysmal afib; mitral and tricuspid regurgitation; myalgia due to statin; recent cataract surgery  PRECAUTIONS: Fall  RED FLAGS: Bowel or bladder incontinence: Yes: chronic bladder issues, bowel urgency - somewhat worse than before   WEIGHT BEARING RESTRICTIONS: No  FALLS:  Has patient fallen in last 6 months? No  LIVING ENVIRONMENT: Lives with: lives with their spouse Lives in: House/apartment Stairs: Yes: External: 5-6 steps; on left going up Has following equipment at home: Walker - 2 wheeled, shower chair, and hiking/walking poles  OCCUPATION: Retired runner, broadcasting/film/video  PLOF: Independent and Leisure: walking ~30 minutes 1x/day (3x around the court near where she lives); sewing/quilting; likes working in the yard    PATIENT GOALS: To start walking again w/o doing more damage.   OBJECTIVE: (objective measures completed at initial evaluation unless otherwise dated)  DIAGNOSTIC FINDINGS:  04/21/24 - XR RIGHT HIP: An AP pelvis and lateral of the right hip shows well-maintained joint space with no cortical irregularities around the hip or the trochanteric area of the hip.  The lower lumbar spine shows severe degenerative changes at L4-L5 and L5-S1 on the AP view.   08/27/22 - LUMBAR SPINE - COMPLETE 4+ VIEW FINDINGS: There is mild levoconvex curvature of the lumbar spine, unchanged. Alignment is anatomic. There are bilateral transpedicular screws in posterior fusion rods present at L2-L3, new from prior. Disc spacers present. Alignment is anatomic. No fractures are identified. There is moderate severe disc space narrowing at L4-L5 and L5-S1 as well as L1-L2 similar to the prior study. There are atherosclerotic calcifications of the aorta.    IMPRESSION: 1. Status post posterior fusion at L2-L3. Alignment is anatomic. 2. Multilevel degenerative disc disease, similar to prior study.  PATIENT SURVEYS:  Modified Oswestry:  MODIFIED OSWESTRY DISABILITY SCALE Date:  05/11/2024 07/06/24  Pain intensity 2 =  Pain medication provides me with complete relief from pain. 2  2. Personal care (washing, dressing, etc.) 1 =  I can take care of myself normally, but it increases my pain. 2  3. Lifting 4 = I can lift only very light weights 4  4. Walking 1 = Pain prevents me from walking more than 1 mile. 3  5. Sitting 1 =  I can only sit in my favorite chair as long as I like. 1  6. Standing 2 =  Pain prevents me from standing more than 1 hour 3  7. Sleeping 2 =  Even when I take pain medication, I sleep less than 6 hours 1  8. Social Life 2 = Pain prevents me from participating in more energetic activities (eg. sports, dancing). 3  9. Traveling 2 =  My pain restricts my travel over 2 hours. 3  10. Employment/ Homemaking 2 = I can perform most of my homemaking/job duties, but pain prevents me from performing more physically stressful activities (eg, lifting, vacuuming). 2  Total 17 / 50 = 34.0 % 24 / 50 = 48.0 %  Interpretation Moderate disability Severe disability   Interpretation of scores: Score Category Description  0-20% Minimal Disability The patient can cope with most living activities. Usually no treatment is indicated apart from advice on lifting, sitting and exercise  21-40% Moderate Disability The patient experiences more pain and difficulty  with sitting, lifting and standing. Travel and social life are more difficult and they may be disabled from work. Personal care, sexual activity and sleeping are not grossly affected, and the patient can usually be managed by conservative means  41-60% Severe Disability Pain remains the main problem in this group, but activities of daily living are affected. These patients require a detailed  investigation  61-80% Crippled Back pain impinges on all aspects of the patient's life. Positive intervention is required  81-100% Bed-bound These patients are either bed-bound or exaggerating their symptoms  Bluford FORBES Zoe DELENA Karon DELENA, et al. Surgery versus conservative management of stable thoracolumbar fracture: the PRESTO feasibility RCT. [place] ([place]): [employer]; 2021 Nov. [facility] Technology Assessment, No. 25.62.) Appendix 3, Oswestry Disability Index category descriptors. Available from: Findjewel[employer]cz Minimally Clinically Important Difference (MCID) = 12.8%  LEFS  Extreme difficulty/unable (0), Quite a bit of difficulty (1), Moderate difficulty (2), Little difficulty (3), No difficulty (4) Survey date:  05/11/24 07/06/24   Any of your usual work, housework or school activities 1 2  2. Usual hobbies, recreational or sporting activities 2 2  3. Getting into/out of the bath 2 2  4. Walking between rooms 2 4  5. Putting on socks/shoes 2 4  6. Squatting  0 2  7. Lifting an object, like a bag of groceries from the floor 2 3  8. Performing light activities around your home 2 2  9. Performing heavy activities around your home 0 1  10. Getting into/out of a car 2 1  11. Walking 2 blocks 0 1  12. Walking 1 mile 2 0  13. Going up/down 10 stairs (1 flight) 2 3  14. Standing for 1 hour 2 1  15.  sitting for 1 hour 3 4  16. Running on even ground 0 0  17. Running on uneven ground 0 0  18. Making sharp turns while running fast 0 0  19. Hopping  0 0  20. Rolling over in bed 2 3  Score total:   26 / 80 = 32.5 % 35 / 80 = 43.8 %  Functional limitation: Moderate Moderate     SCREENING FOR RED FLAGS: Bowel or bladder incontinence: Yes: chronic bladder issues, bowel urgency - somewhat worse than before Spinal tumors: No Cauda equina syndrome: No Compression fracture: No Abdominal aneurysm: No  COGNITION:  Overall cognitive status: Within  functional limits for tasks assessed    SENSATION: WFL Except B distal LE neuropathy  POSTURE:  rounded shoulders, forward head, right pelvic obliquity, and scoliosis  PALPATION: Increased muscle tension in L>R lumbar paraspinals and R>L glutes and piriformis with TTP over inferior glutes and piriformis near hamstring origin  LUMBAR ROM: *Lumbar ROM assessed with UE support for balance and close SBA/CGA of PT  Active  Eval  Flexion Hands to distal shins  Extension 50% limited  Right lateral flexion Hand to upper thigh - p!  Left lateral flexion Hand to lateral knee  Right rotation 50% limited  Left rotation 30% limited - p!  (Blank rows = not tested)  MUSCLE LENGTH: Hamstrings: mild/mod tight B ITB: mild tight B Piriformis: mod/severe tight B Hip IR: mod tight B Hip flexors: mild tight R Quads: mild tight R>L Heelcord: mild/mod tight B  LOWER EXTREMITY ROM:    Grossly WFL other than limitations indicated above due to muscle tightness   LOWER EXTREMITY MMT:    MMT Right eval Left eval R 07/06/24 L 07/06/24  Hip  flexion 3+ 4- 4- 4  Hip extension 4- 4 4 4   Hip abduction 3+ 4- 4 4  Hip adduction 3+ 4- 4- 4  Hip internal rotation 4- 4- 4+ 4  Hip external rotation 3+ 3+ 4 4-  Knee flexion 4 4 4+ 4+  Knee extension 4 4 4+ 4+  Ankle dorsiflexion 3- 3- 3+ 3-  Ankle plantarflexion      Ankle inversion      Ankle eversion       (Blank rows = not tested)  FUNCTIONAL TESTS: (Assessment completed 05/14/24 unless otherwise indicated) 5 times sit to stand: 12.37 sec w/o UE assist - 06/01/24 Timed up and go (TUG): 13.75 sec with single hiking pole 10 meter walk test: 16.00 sec with single hiking pole Gait speed: 2.05 ft/sec with single hiking pole Berg Balance Scale: 42/56; 37-45 significant (>80%) - 05/19/24 Functional gait assessment: 10/30, < 19 = high risk fall    Standardized testing results as of discharge from latest PT episode (11/11/23): 5xSTS = 11.84 sec  =  13.63 sec w/o AD Gait speed = 2.41 ft/sec w/o AD Berg = 45/56; scores of 37-45 indicate significant (>80%) fall risk  FGA = 19/30; scores of 19-24 indicate a medium risk for falls   GAIT: Distance walked: Clinic distances Assistive device utilized: Single walking/trekking pole on R Level of assistance: SBA Gait pattern: Increased sway, step through pattern, decreased stride length, trendelenburg, lateral hip instability, poor foot clearance- Right, and poor foot clearance- Left Comments: B foot slap   TODAY'S TREATMENT:   07/13/2024  THERAPEUTIC EXERCISE: To improve strength, endurance, ROM, and flexibility.  Demonstration, verbal and tactile cues throughout for technique.  Rec Bike - L4 x 4 min, L3 x 2 min Manual R hip flexor stretch x 30 Manual R TFL stretch x 30 Manual R ITB stretch x 30 S/L RTB clam x 10 bil Bridge + RTB hip ABD isometric - deferred due to increased R buttock and LE radicular pain Bridge + manual PT resisted hip ABD isometric - deferred due to increased R buttock and LE radicular pain Bridge + hip ADD ball squeeze isometric - deferred due to increased R buttock and LE radicular pain  MANUAL THERAPY: To promote movement utilizing connective tissue massage, therapeutic massage, manual TP therapy, and myofascial release. STM/DTM, manual TPR to R distal glutes, piriformis, TFL, and ITB  THERAPEUTIC ACTIVITIES: To improve functional performance.  Demonstration, verbal and tactile cues throughout for technique. Standing hip extension diagonal with looped RTB at distal thighs x 8-10 bil (limited due to fatigue) - added to HEP without RTB Standing L/R hip hike with R foot on 2 block 2 x 10 Standing L/R hip circumduction with opp foot standing on 2 block - focusing on maintaining level pelvis x ~10 each side   07/08/2024  THERAPEUTIC EXERCISE: To improve strength, endurance, ROM, and flexibility.  Demonstration, verbal and tactile cues throughout for technique.   Rec Bike - L4 x 3 min, L3 x 3 min - resistance level reduced due to increasing fatigue Supine manual stretches: R hamstring stretch x 30 R mod Thomas quad/hip flexor stretch x 30 R glute cross body stretch x 30 R ITB cross body stretch x 30 R KTOS piriformis stretch x 30 R figure-4 to chest piriformis stretch x 30  NEUROMUSCULAR RE-EDUCATION: To improve coordination, kinesthesia, and proprioception.  Hooklying R sciatic nerve glide x 10 - patient reporting feeling of less motion at toes during nerve glide Supine/long sitting  R isometric toe curls into therapist's hand x 10 Attempted long sitting R toe curls into YTB however patient unable to create AROM against resistance band  MANUAL THERAPY: To promote normalized muscle tension, improved flexibility, and reduced pain utilizing connective tissue massage, therapeutic massage, manual TP therapy, and myofascial release.  STM/DTM, manual TPR, and percussive massage with massage gun to R distal glutes, piriformis, TFL, distal iliopsoas, vastus lateralis and ITB  MODALITIES: (non-billable) Moist heat pack to R glutes x 10' at end of session   07/06/2024 - Recert THERAPEUTIC EXERCISE: To improve strength and endurance.   Rec Bike - L4 x 3 min, L3 x 3 min - resistance level reduced due to increasing fatigue  THERAPEUTIC ACTIVITIES: To improve functional performance.  Demonstration, verbal and tactile cues throughout for technique.  LE MMT - see above LE MMT table LEFS: 35/80 = 43.8% indicating moderate functional limitation Modified Oswestry: 24/50 = 48% or severe disability Gait speed = 2.08 ft/sec with single hiking pole Goal assessment  PHYSICAL PERFORMANCE TEST or MEASUREMENT: Functional Gait  Assessment  Gait Level Surface Walks 20 ft, slow speed, abnormal gait pattern, evidence for imbalance or deviates 10-15 in outside of the 12 in walkway width. Requires more than 7 sec to ambulate 20 ft.   Change in Gait Speed Makes only  minor adjustments to walking speed, or accomplishes a change in speed with significant gait deviations, deviates 10-15 in outside the 12 in walkway width, or changes speed but loses balance but is able to recover and continue walking.   Gait with Horizontal Head Turns Performs head turns with moderate changes in gait velocity, slows down, deviates 10-15 in outside 12 in walkway width but recovers, can continue to walk.   Gait with Vertical Head Turns Performs task with slight change in gait velocity (eg, minor disruption to smooth gait path), deviates 6 - 10 in outside 12 in walkway width or uses assistive device   Gait and Pivot Turn Pivot turns safely in greater than 3 sec and stops with no loss of balance, or pivot turns safely within 3 sec and stops with mild imbalance, requires small steps to catch balance.   Step Over Obstacle Is able to step over one shoe box (4.5 in total height) without changing gait speed. No evidence of imbalance.   Gait with Narrow Base of Support Ambulates less than 4 steps heel to toe or cannot perform without assistance.   Gait with Eyes Closed Cannot walk 20 ft without assistance, severe gait deviations or imbalance, deviates greater than 15 in outside 12 in walkway width or will not attempt task.   Ambulating Backwards Walks 20 ft, slow speed, abnormal gait pattern, evidence for imbalance, deviates 10-15 in outside 12 in walkway width.   Steps Alternating feet, must use rail.   Total Score 12   FGA comment: < 19 = high risk fall        07/01/2024  THERAPEUTIC EXERCISE: To improve strength, endurance, ROM, and flexibility.  Demonstration, verbal and tactile cues throughout for technique.  Rec Bike - L3 x 6 min Seated 3-way thoracolumbar flexion stretch 2 x 30 each position, using hiking pole for UE support Supine R sciatic nerve glides 2 x 10 bil Hooklying R figure-4 piriformis stretch x 30 Hooklying R KTOS piriformis stretch x 30 - pt noting increased groin  anterior thigh discomfort Mod thomas R quad/hip flexor stretch 2 x 30 - 1 rep each w/o and with strap Supine R HS stretch  with strap x 30 Supine R ITB stretch with strap x 30   MANUAL THERAPY: To promote normalized muscle tension, improved flexibility, improved joint mobility, and reduced pain utilizing connective tissue massage, therapeutic massage, manual TP therapy, and myofascial release.  STM/DTM and manual TPR to R lower glutes and piriformis   SELF CARE:  Provided education on self-STM/muscle release options and need to break up her travel to [place] with frequent stops to allow her to stretch and walk around.   MODALITIES: (non-billable) Moist heat pack to R glutes x 10' at end of session   06/24/2024 THERAPEUTIC EXERCISE: To improve strength, endurance, ROM, and flexibility.  Demonstration, verbal and tactile cues throughout for technique.  Rec Bike - L3 x 6 min Seated 3-way thoracolumbar flexion stretch 2 x 30 each position, using hiking pole for UE support, clarifying angle for lateral stretches Seated B foot/ankle arch raises x 10 Seated toe curls/towel scrunches - patient with difficulty achieving grip of towel with toes therefore suggested patient try using a small object like a pen or pencil to work on grip with toes at home  NEUROMUSCULAR RE-EDUCATION: To improve coordination, kinesthesia, and reduce nerve irritation. Seated sciatic nerve glides - difficulty coordinating movement  Supine sciatic nerve glides 2 x 10 bil Supine sciatic nerve glides with 90/90 knee flexion x 10 bil - patient reporting better response to previous exercise  THERAPEUTIC ACTIVITIES: To improve functional performance.  Demonstration, verbal and tactile cues throughout for technique. Unsupported mini-squat hovering over mat table with arms forward for balance (chair placed in front for safety with intermittent support) x 10 - attempted in bare feet today as patient feeling like she is  losing proprioceptive awareness and control in her feet, however still demonstrating frequent loss of eccentric control/uncontrolled descent onto mat table   06/22/2024 THERAPEUTIC EXERCISE: To improve strength, endurance, and flexibility.  Demonstration, verbal and tactile cues throughout for technique.  Rec Bike - L3 x 6 min Seated 3-way thoracolumbar flexion stretch 2 x 30 each position using hiking pole for UE support  THERAPEUTIC ACTIVITIES: To improve functional performance.  Demonstration, verbal and tactile cues throughout for technique. Unsupported mini-squat hovering over mat table with arms forward for balance (chair placed in front for safety with intermittent support) x 10 - more frequent loss of eccentric control/uncontrolled descent onto mat table Unsupported mini-squat with glute touch touch to rounded bolster on mat table with arms forward for balance (chair placed in front for safety with intermittent support) x 10  NEUROMUSCULAR RE-EDUCATION: To improve balance, coordination, kinesthesia, posture, proprioception, and amplitude of movement.  S/L R hip ABD/ext diagonals 2 x 5 - L knee flexed for increased ant/post stability to prevent pt from rolling backward & PT guiding motion for 1st few reps; repeated x 10 with L LE  S/L R hip circumduction arc over L LE tapping R foot in front and behind L foot x 5 - very limited eccentric control with posterior tap; repeated x 10 with L LE   06/08/2024 THERAPEUTIC EXERCISE: To improve strength, endurance, ROM, and flexibility.  Demonstration, verbal and tactile cues throughout for technique. Rec Bike - L3 x 6 min Seated 3-way thoracolumbar flexion stretch 2 x 30 each position - 1st reps with hands on green Pball, 2nd reps using hiking pole for UE support Quadruped 3-way child's pose x 30 each  THERAPEUTIC ACTIVITIES: To improve functional performance.  Demonstration, verbal and tactile cues throughout for technique. Reviewed proper  lifting mechanics using squatting  motion and golfer's lift  Unsupported mini-squat hovering over mat table with arms forward for balance (chair placed in front for safety but not needed for support) x 8  SELF CARE:  Provided education on managing muscle cramping through stretching, increased hydration, and potential Mg supplement (instructed pt to verify with PCP that there are no concerns or potential medicine interactions with trying Mg supplement).   06/03/2024  THERAPEUTIC EXERCISE: To improve strength, endurance, and flexibility.  Demonstration, verbal and tactile cues throughout for technique.  Rec Bike - L3 x 6 min Standing lateral lean R ITB over back of chair 2 x 30 Supine ITB stretch with strap 3 x 30 bil  MANUAL THERAPY: To promote normalized muscle tension, improved flexibility, and reduced pain utilizing connective tissue massage, therapeutic massage, manual TP therapy, and myofascial release.  STM/DTM, IASTM with foam roller and manual TPR to R distal glutes, HS and ITB  NEUROMUSCULAR RE-EDUCATION: To improve coordination, kinesthesia, posture, proprioception, and amplitude of movement.  S/L R hip ABD/ext diagonals x 10 - slight PT assist initially to stabilize hips to prevent anterior/posterior rotation & VC/TC to avoid hip ER S/L R hip circumduction arc over L LE tapping R foot in front and behind L foot x 5 - very limited eccentric control with posterior tap   SELF CARE:  HEP update Provided instruction in self-STM techniques to R ITB using rolling pin.    06/01/2024 THERAPEUTIC EXERCISE: To improve strength and endurance.  Demonstration, verbal and tactile cues throughout for technique. NuStep - L4 x 6 min (LE only)  THERAPEUTIC ACTIVITIES: To improve functional performance.  Demonstration, verbal and tactile cues throughout for technique. 5xSTS = 12.37 sec w/o UE assist, somewhat uncontrolled descent esp on last rep  TRIGGER POINT DRY NEEDLING: Treatment  instructions/education: Initial Treatment: Pt instructed on Dry Needling rational, procedures, and possible side effects. Pt instructed to expect mild to moderate muscle soreness later in the day and/or into the next day.  Pt instructed in methods to reduce muscle soreness. Pt instructed to continue prescribed HEP. Patient was educated on signs and symptoms of infection and other risk factors and advised to seek medical attention should they occur.  Patient verbalized understanding of these instructions and education.  Education Handout Provided: Yes Consent: Patient Verbal Consent Given: Yes Treatment: Muscles Treated: R medial and lateral piriformis, R glute medius Utilized skilled palpation to identify bony landmarks and trigger points along with monitoring of soft tissue during DN. Electrical Stimulation Performed: No Treatment Response/Outcome: Twitch response elicited, Palpable increase in muscle length, Decreased tissue resistance noted, Decreased pain/TTP, and Improved exercise tolerance  MANUAL THERAPY: To promote normalized muscle tension, improved flexibility, and reduced pain. STM/DTM, manual TPR and pin & stretch to muscles addressed with DN   NEUROMUSCULAR RE-EDUCATION: To improve balance, coordination, kinesthesia, posture, proprioception, and amplitude of movement. Mini-squat with counter support x 10   05/19/24 THERAPEUTIC EXERCISE: To improve ROM and flexibility.  Demonstration, verbal and tactile cues throughout for technique.  Bike L3x66min Standing runner stretch x 30 BLE Standing soleus stretch x 30 BLE Table piriformis stretch 2x30 RLE Supine L figure 4 stretch 2x30 LLE Supine glute stretch 2x30 BLE Supine KTOS stretch 2x30 BLE  Seated clams with GTB + trA x 10 BLE Seated marching GTB x + TrA 10 BLE Berg Balance Test  Sit to Stand Able to stand without using hands and stabilize independently   Standing Unsupported Able to stand 2 minutes with  supervision   Sitting with  Back Unsupported but Feet Supported on Floor or Stool Able to sit safely and securely 2 minutes   Stand to Sit Sits safely with minimal use of hands   Transfers Able to transfer safely, minor use of hands   Standing Unsupported with Eyes Closed Able to stand 10 seconds with supervision   Standing Unsupported with Feet Together Able to place feet together independently and stand 1 minute safely   From Standing, Reach Forward with Outstretched Arm Can reach confidently >25 cm (10)   From Standing Position, Pick up Object from Floor Able to pick up shoe safely and easily   From Standing Position, Turn to Look Behind Over each Shoulder Looks behind one side only/other side shows less weight shift   Turn 360 Degrees Able to turn 360 degrees safely but slowly   Standing Unsupported, Alternately Place Feet on Step/Stool Able to complete 4 steps without aid or supervision   Standing Unsupported, One Foot in Front Needs help to step but can hold 15 seconds   Standing on One Leg Unable to try or needs assist to prevent fall   Total Score 42       05/14/2024  THERAPEUTIC ACTIVITIES: To improve functional performance.  Demonstration, verbal and tactile cues throughout for technique.  5xSTS: Deferred due to increased pain with uncontrolled descent upon sitting TUG: 13.75 sec with single hiking pole : 16.00 sec with single hiking pole Gait speed: 2.05 ft/sec with single hiking pole  PHYSICAL PERFORMANCE TEST or MEASUREMENT: Functional Gait  Assessment  Gait Level Surface Walks 20 ft, slow speed, abnormal gait pattern, evidence for imbalance or deviates 10-15 in outside of the 12 in walkway width. Requires more than 7 sec to ambulate 20 ft.   Change in Gait Speed Makes only minor adjustments to walking speed, or accomplishes a change in speed with significant gait deviations, deviates 10-15 in outside the 12 in walkway width, or changes speed but loses balance but is able  to recover and continue walking.   Gait with Horizontal Head Turns Performs head turns with moderate changes in gait velocity, slows down, deviates 10-15 in outside 12 in walkway width but recovers, can continue to walk.   Gait with Vertical Head Turns Performs task with moderate change in gait velocity, slows down, deviates 10-15 in outside 12 in walkway width but recovers, can continue to walk.   Gait and Pivot Turn Turns slowly, requires verbal cueing, or requires several small steps to catch balance following turn and stop   Step Over Obstacle Is able to step over one shoe box (4.5 in total height) without changing gait speed. No evidence of imbalance.   Gait with Narrow Base of Support Ambulates less than 4 steps heel to toe or cannot perform without assistance.   Gait with Eyes Closed Cannot walk 20 ft without assistance, severe gait deviations or imbalance, deviates greater than 15 in outside 12 in walkway width or will not attempt task.   Ambulating Backwards Walks 20 ft, slow speed, abnormal gait pattern, evidence for imbalance, deviates 10-15 in outside 12 in walkway width.   Steps Alternating feet, must use rail.   Total Score 10   FGA comment: < 19 = high risk fall      THERAPEUTIC EXERCISE: To improve ROM and flexibility.  Demonstration, verbal and tactile cues throughout for technique.  Hooklying R HS stretch with strap hamstring 2 x 30 Supine gluteus stretch 2 x 30 bil Hooklying figure-4 piriformis stretch x 30 on  L, deferred d/t increased pain on R Hooklying KTOS piriformis stretch x 30 on L, deferred d/t increased pain on R Side-sitting R modified pigeon piriformis stretch 2 x 30 - good stretch noted without increased pain Seated R KTOS piriformis stretch 2 x 30 - good stretch noted without increased pain  SELF CARE:   Modified HEP to address concerns regarding increased R buttock/hip pain with piriformis stretching, with patient reporting better tolerance for seated  piriformis stretching.  Patient cautioned not to force any painful ROM/stretching and report back to PT next visit if HEP continues to irritate or cause increased pain. Provided instruction in self-STM techniques to R glutes/piriformis using tennis ball against wall or in chair.    05/04/2024  SELF CARE:  Reviewed eval findings and role of PT in addressing identified deficits as well as instruction in initial HEP (see below).    PATIENT EDUCATION:  Education details: continue with current HEP  Person educated: Patient Education method: Explanation Education comprehension: verbalized understanding  HOME EXERCISE PROGRAM: Access Code: [facility] URL: [email or web address] Date: 07/13/2024 Prepared by: Elijah Hidden  Exercises - Standing Soleus Stretch  - 1 x daily - 7 x weekly - 3 sets - 3 reps - 30 sec hold - Hooklying Hamstring Stretch with Strap  - 2 x daily - 7 x weekly - 3 reps - 30 sec hold - Supine Gluteus Stretch  - 2 x daily - 7 x weekly - 3 reps - 30 sec hold - Supine Figure 4 Piriformis Stretch  - 2 x daily - 7 x weekly - 3 reps - 30 sec hold - Supine Piriformis Stretch with Foot on Ground  - 2 x daily - 7 x weekly - 3 reps - 30 sec hold - Seated Table Piriformis Stretch  - 2 x daily - 7 x weekly - 3 reps - 30 sec hold - Seated Piriformis Stretch  - 2 x daily - 7 x weekly - 3 reps - 30 sec hold - Seated March with Resistance  - 1 x daily - 7 x weekly - 2 sets - 10 reps - Seated Isometric Hip Abduction with Resistance  - 1 x daily - 7 x weekly - 2 sets - 10 reps - Glute Max Release With [employer] Against Wall  - 1 x daily - 7 x weekly - 1-2 min hold - Mini Squat with Counter Support  - 1 x daily - 3 x weekly - 2 sets - 10 reps - 3-5 sec hold - Standing ITB Stretch (Mirrored)  - 2 x daily - 7 x weekly - 3 reps - 30 sec hold - Supine Iliotibial Band Stretch with Strap  - 2 x daily - 7 x weekly - 3 reps - 30 sec hold - Sidelying Diagonal Hip Abduction  - 1 x daily  - 3 x weekly - 2 sets - 10 reps - 3 sec hold - Sidelying Hip Abduction and Extension (Mirrored)  - 1 x daily - 3 x weekly - 2 sets - 5 reps - 3 sec hold - Seated 3 Way Exercise Ball Roll Out Stretch  - 1 x daily - 7 x weekly - 2 sets - 3 reps - 30 sec hold - Mini Squat with Chair  - 1 x daily - 3 x weekly - 2 sets - 10 reps - 3 sec hold - Supine Sciatic Nerve Glide  - 1 x daily - 7 x weekly - 2 sets - 10 reps - 3 sec  hold - Hip Hiking on Step  - 1 x daily - 3 x weekly - 2 sets - 10 reps - 3 sec hold - Diagonal Hip Extension  - 1 x daily - 3 x weekly - 2 sets - 10 reps - 3 sec hold  Patient Education - Lifting Techniques   ASSESSMENT:  CLINICAL IMPRESSION: [name] Trujillo reports continued intermittent limitation with ambulation tolerance due to recurrent R buttock and proximal LE radicular pain.  Palpation revealing increased muscle tension with TTP over piriformis and distal glutes but not to the severity of pain which occurs with walking.  She continues to demonstrate B Trendelenburg with ambulation indicating ongoing glute medius weakness which may be contributing to onset of pain while walking, therefore focused on hip strengthening with emphasis on glutes in particular glute medius to promote improved pelvic stability during ambulation.  Fatigue noted during exercise performance, however pain only reported with attempts at bridges, and no pain noted with ambulation following exercise performance.  [name] Trujillo will benefit from continued skilled PT to address ongoing flexibility, strength and balance deficits deficits to improve mobility and activity tolerance with decreased pain interference and decreased risk for falls.    EVAL: [name] Trujillo is a 83 y.o. female who was referred to physical therapy for evaluation and treatment for R hip/buttock pain presumed to be the result of lumbar radiculopathy.  Patient reports onset of R buttock pain beginning Memorial Day weekend 2025.  Pain is worse with walking  and positional changes.  Patient has deficits in hip ROM, proximal LE flexibility, B LE strength, abnormal posture, and TTP with abnormal muscle tension in R lower glutes and piriformis which are interfering with ADLs and are impacting quality of life.  On Modified Oswestry patient scored 17/50 demonstrating 34% or moderate disability.  On LEFS patient scored 26/80 demonstrating moderate functional limitation.  Given her significant LE weakness and impaired gait pattern, will plan more formalized balance testing next visit.  [name] Trujillo will benefit from skilled PT to address above deficits to improve mobility and activity tolerance with decreased pain interference.     OBJECTIVE IMPAIRMENTS: Abnormal gait, decreased activity tolerance, decreased balance, decreased knowledge of condition, decreased mobility, difficulty walking, decreased ROM, decreased strength, increased fascial restrictions, impaired perceived functional ability, increased muscle spasms, impaired flexibility, impaired sensation, improper body mechanics, postural dysfunction, and pain.   ACTIVITY LIMITATIONS: carrying, lifting, bending, standing, squatting, sleeping, stairs, transfers, bed mobility, bathing, toileting, dressing, reach over head, hygiene/grooming, locomotion level, and caring for others  PARTICIPATION LIMITATIONS: meal prep, cleaning, laundry, driving, shopping, community activity, yard work, and church  PERSONAL FACTORS: Age, Past/current experiences, Time since onset of injury/illness/exacerbation, and 3+ comorbidities: L2-3 XLIF/PSF 05/24/22; C5-7 ACDF 09/21/20; idiopathic progressive neuropathy; hypthyroidism; DDD; remote h/o back surgery at age 72; h/o scoliosis; 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   REHAB POTENTIAL: Good  CLINICAL DECISION MAKING:  Unstable/unpredictable  EVALUATION COMPLEXITY: High   GOALS: Goals reviewed with patient? Yes  SHORT TERM GOALS: Target date: 06/15/2024  Patient will be independent with initial HEP to improve outcomes and carryover.  Baseline: Initial HEP provided on eval Goal status: MET - 05/19/24  2.  Patient will report 25% improvement in low back pain to improve QOL. Baseline: 2/10 on eval, up to 10/10 06/03/24 - improvement in pain noted by end of today's session Goal status: MET - 06/08/24 - Pt reports 50+% improvement in  pain thus far  3.  Complete standardized balance assessment and update LTG's as indicated. Baseline:  05/14/24 - , TUG and FGA completed; Berg and 5xSTS pending  05/19/24 GLENWOOD Levins completed Goal status: MET - 06/01/24 - 5xSTS completed  LONG TERM GOALS: Target date: 07/20/2024, extended to 08/31/2024  Patient will be independent with ongoing/advanced HEP for self-management at home.  Baseline:  06/22/24 - HEP reviewed today Goal status: IN PROGRESS - 07/06/24 - no concerns with current HEP  2.  Patient will report 50-75% improvement in R hip/pelvic pain to improve QOL.  Baseline: 2/10 on eval, up to 10/10 06/08/24 - Pt reports 50+% improvement in pain thus far Goal status: IN PROGRESS - 07/06/24 - 50-60% improvement overall, but still having periods of more intense pain  3.  Patient will demonstrate improved B proximal LE strength to >/= 4 to 4+/5 for improved stability and ease of mobility. Baseline: Refer to above LE MMT table Goal status: IN PROGRESS - 07/06/24   4.  Patient will report >/= 35/80 on LEFS (MCID = 9 pts) to demonstrate improved functional ability. Baseline: 26 / 80 = 32.5 % Goal status: MET - 07/06/24 - 35 / 80 = 43.8 %  5. Patient will report </= 22% on Modified Oswestry (MCID = 12%) to demonstrate improved functional ability with decreased pain interference. Baseline: 17 / 50 = 34.0 % Goal status: IN PROGRESS - 07/06/24 - 24 / 50 = 48.0 %  6.   Patient will resume her normal walking routine w/o increased pain to allow for  improved mobility and activity tolerance. Baseline: R ischial pain limiting walking tolerance Goal status: IN PROGRESS - 07/06/24 - attempted to try walking in her neighborhood last week but experienced increased pain while trying to walk up a incline that caused her to have to stop and return home   7.  Patient will improve Berg score by at least 8 points or to >/= 45/56 (baseline as of prior D/C from PT) to improve safety and stability with ADLs in standing and reduce risk for falls.  Baseline: 42/56 (05/19/24) Goal status: IN PROGRESS   8.  Patient will improve FGA score by at least 4 points or to >/= 19/30 (baseline as of prior D/C from PT) to improve gait stability and reduce risk for falls.   Baseline: 10/30 (05/14/24) Goal status: IN PROGRESS - 07/06/24 - 12/30  9.  Patient will improve gait velocity to at least 2.62 ft/sec for improved gait efficiency and safety with community ambulation.  Baseline: 2.05 ft/sec with single hiking pole (05/14/24) Goal status: IN PROGRESS - 07/06/24 - 2.08 ft/sec with single hiking pole   PLAN:  PT FREQUENCY: 2x/week  PT DURATION: 8 weeks  PLANNED INTERVENTIONS: 97164- PT Re-evaluation, 97750- Physical Performance Testing, 97110-Therapeutic exercises, 97530- Therapeutic activity, 97112- Neuromuscular re-education, 97535- Self Care, [id number]- Manual therapy, [phone]- Gait training, [phone]- Electrical stimulation (unattended), 97035- Ultrasound, 02966- Ionotophoresis 4mg /ml Dexamethasone, 79439 (1-2 muscles), 20561 (3+ muscles)- Dry Needling, Patient/Family education, Balance training, Stair training, Taping, Joint mobilization, DME instructions, Cryotherapy, and Moist heat  PLAN FOR NEXT SESSION: Reassess Berg; stretch progression as indicated; progress gentle lumbopelvic/proximal LE strengthening as pain allows; MT +/- TPDN to address abnormal muscle tension in glutes and piriformis;  potential trial of TENS unit if pain persisting; balance training to decrease fall risk   Elijah CHRISTELLA Hidden, PT 07/13/2024, 3:42 PM

## 2024-07-15 ENCOUNTER — Ambulatory Visit: Admitting: Physical Therapy

## 2024-07-15 ENCOUNTER — Encounter: Payer: Self-pay | Admitting: Physical Therapy

## 2024-07-15 DIAGNOSIS — R252 Cramp and spasm: Secondary | ICD-10-CM

## 2024-07-15 DIAGNOSIS — M5416 Radiculopathy, lumbar region: Secondary | ICD-10-CM

## 2024-07-15 DIAGNOSIS — R2681 Unsteadiness on feet: Secondary | ICD-10-CM

## 2024-07-15 DIAGNOSIS — M6281 Muscle weakness (generalized): Secondary | ICD-10-CM

## 2024-07-15 DIAGNOSIS — M25551 Pain in right hip: Secondary | ICD-10-CM

## 2024-07-15 DIAGNOSIS — R2689 Other abnormalities of gait and mobility: Secondary | ICD-10-CM

## 2024-07-15 NOTE — Therapy (Signed)
 OUTPATIENT PHYSICAL THERAPY TREATMENT      Patient Name: Ariday Brinker MRN: [id number] DOB:Aug 17, 1941, 83 y.o., female Today's Date: 07/15/2024  END OF SESSION:  PT End of Session - 07/15/24 1450     Visit Number 13    Date for Recertification  08/31/24    Authorization Type [employer]    Authorization Time Period 07/07/24 - 08/31/24    Authorization - Visit Number 3    Authorization - Number of Visits 8    Progress Note Due on Visit 20    PT Start Time 1450    PT Stop Time 1530    PT Time Calculation (min) 40 min    Activity Tolerance Patient tolerated treatment well    Behavior During Therapy [facility] for tasks assessed/performed               Past Medical History:  Diagnosis Date   High cholesterol    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   She had ACUTE TAKOTSUBO (STRESS) CARDIOMYOPATHY with elevated Troponin Levels - this would be considered Demand Ischemia - Demand Infarction & NOT associated with ACS/CAD.     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  - Cath with NO CAD. ==> RESOLVED: f/u Echo 03/2022: EF 60-65%.   Past Surgical History:  Procedure Laterality Date   APPEN[age]CTOMY     76-20 yo   BACK SURGERY     Age 78   CESAREAN SECTION     ECTOPIC PREGNANCY SURGERY     LAPAROSCOPIC HYSTERECTOMY     LEFT HEART CATH AND CORONARY ANGIOGRAPHY N/A 01/09/2022   Procedure: LEFT HEART CATH AND CORONARY ANGIOGRAPHY;  Surgeon: Wonda Sharper, MD;  Location: [facility] INVASIVE CV LAB;  Service: CV:: Widely patent coronaries with mild nonobstructive LAD plaquing.  Right dominant system.  Normal LVEDP.  Based on clinical presentation, findings are consistent with acute  Takotsubo Cardiomyopathy Syndrome   POSTERIOR FUSION LUMBAR SPINE  05/24/2022   [facility], [place]; [name], MD): L2-L3 XLIF, L2-L3 POSTERIOR DECOMPRESSION AND FUSION   TRANSTHORACIC ECHOCARDIOGRAM  01/08/2022   Severely decreased LV function-EF 25 to 30%.  Mid to apical (mostly anterior) with normal basal motion.  GR 2 DD-elevated LAP.  Mildly dilated LA.  Aortic sclerosis with no stenosis.  No AI.  Normal MV with mild to moderate TR.  Mildly elevated RAP, and PAP (estimated 49 mg). If LAD CAD ruled out - consistent with Takotsubo CM Syndrome.   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) 12/30/2023   New onset atrial fibrillation (HCC) 12/30/2023   Myopia of both eyes with astigmatism and presbyopia 05/06/2023   Dry eye syndrome of both eyes 05/06/2023   Mild tricuspid regurgitation 05/06/2022   Mild mitral regurgitation 05/06/2022   Hypertension 05/06/2022   History of seizure 05/06/2022   Takotsubo cardiomyopathy-resolved 01/10/2022  Hyperlipidemia with target LDL less than 100 01/10/2022   Hypothyroid 01/10/2022   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) 01/08/2022   Numbness in both legs 04/10/2021   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   Posterior vitreous detachment of both eyes 02/05/2018   Ventricular premature beats 10/31/2015   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   Pure hypercholesterolemia 06/28/2014   Plantar fasciitis 06/28/2014    PCP: Windy Coy, MD   REFERRING PROVIDER: Vernetta Lonni GRADE, MD  REFERRING DIAG:  [phone] (ICD-10-CM) - Pain in  right hip  M54.50 (ICD-10-CM) - Low back pain, unspecified  Eval and treat low back/right pelvis and right hip/ishium Any modalities per therapist   THERAPY DIAG:  Radiculopathy, lumbar region  Pain in right hip  Muscle weakness (generalized)  Cramp and spasm  Other abnormalities of gait and mobility  Unsteadiness on feet  RATIONALE FOR EVALUATION AND TREATMENT: Rehabilitation  ONSET DATE: Memorial Day weekend  NEXT MD VISIT: 07/28/2024    SUBJECTIVE:                                                                                                                                                                                                         SUBJECTIVE STATEMENT: Xiara reports she had a really good day yesterday and was able to go for a walk on level ground with her friend yesterday but having more of her recent R buttock and thigh pain with walking today.  Pt reports she tried to go for a walk with her friend yesterday but only made it to the end of her driveway before the pain got so bad (6-7/10) that she had to have her husband come out and help her back into the house.  She had been walking around in the house before going outside w/o any pain.  EVAL: Avie is well know to me from several prior PT episodes for foot drop, cervical and lumbar radiculopathy.  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  Woke up the next morning with severe R hip/buttock pain.  Pain mostly localized to sciatic and ischial area of her R buttock.  PCP provider told her it was likely hip bursitis but ortho MD feels like it is more likely radicular from her back.  Some relief from steroid injection around her right hip ischial area provided by ortho MD but pain has never fully gone away.   She does have a significant lurch in her gait  which is somewhat more pronounced than on prior PT episodes with this  PT.  PAIN: Are you having pain? No and Yes: NPRS scale: 3-4/10 with walking  Pain location: R lower buttock  Pain description: constant   Aggravating factors: walking  Relieving factors: Theraworx cream   PERTINENT HISTORY:  L2-3 XLIF/PSF 05/24/22; C5-7 ACDF 09/21/20; idiopathic progressive neuropathy; hypthyroidism; DDD; remote h/o back surgery at age 69; h/o scoliosis; Takotsubo cardiomyopathy 01/10/22; CAD; paroxysmal afib; mitral and tricuspid regurgitation; myalgia due to statin; recent cataract surgery  PRECAUTIONS: Fall  RED FLAGS: Bowel or bladder incontinence: Yes: chronic bladder issues, bowel urgency - somewhat worse than before   WEIGHT BEARING RESTRICTIONS: No  FALLS:  Has patient fallen in last 6 months? No  LIVING ENVIRONMENT: Lives with: lives with their spouse Lives in: House/apartment Stairs: Yes: External: 5-6 steps; on left going up Has following equipment at home: Walker - 2 wheeled, shower chair, and hiking/walking poles  OCCUPATION: Retired runner, broadcasting/film/video  PLOF: Independent and Leisure: walking ~30 minutes 1x/day (3x around the court near where she lives); sewing/quilting; likes working in the yard    PATIENT GOALS: To start walking again w/o doing more damage.   OBJECTIVE: (objective measures completed at initial evaluation unless otherwise dated)  DIAGNOSTIC FINDINGS:  04/21/24 - XR RIGHT HIP: An AP pelvis and lateral of the right hip shows well-maintained joint space with no cortical irregularities around the hip or the trochanteric area of the hip.  The lower lumbar spine shows severe degenerative changes at L4-L5 and L5-S1 on the AP view.   08/27/22 - LUMBAR SPINE - COMPLETE 4+ VIEW FINDINGS: There is mild levoconvex curvature of the lumbar spine, unchanged. Alignment is anatomic. There are bilateral transpedicular screws in posterior fusion rods present at L2-L3, new from prior. Disc spacers present. Alignment is anatomic. No fractures are identified.  There is moderate severe disc space narrowing at L4-L5 and L5-S1 as well as L1-L2 similar to the prior study. There are atherosclerotic calcifications of the aorta.   IMPRESSION: 1. Status post posterior fusion at L2-L3. Alignment is anatomic. 2. Multilevel degenerative disc disease, similar to prior study.  PATIENT SURVEYS:  Modified Oswestry:  MODIFIED OSWESTRY DISABILITY SCALE Date:  05/11/2024 07/06/24  Pain intensity 2 =  Pain medication provides me with complete relief from pain. 2  2. Personal care (washing, dressing, etc.) 1 =  I can take care of myself normally, but it increases my pain. 2  3. Lifting 4 = I can lift only very light weights 4  4. Walking 1 = Pain prevents me from walking more than 1 mile. 3  5. Sitting 1 =  I can only sit in my favorite chair as long as I like. 1  6. Standing 2 =  Pain prevents me from standing more than 1 hour 3  7. Sleeping 2 =  Even when I take pain medication, I sleep less than 6 hours 1  8. Social Life 2 = Pain prevents me from participating in more energetic activities (eg. sports, dancing). 3  9. Traveling 2 =  My pain restricts my travel over 2 hours. 3  10. Employment/ Homemaking 2 = I can perform most of my homemaking/job duties, but pain prevents me from performing more physically stressful activities (eg, lifting, vacuuming). 2  Total 17 / 50 = 34.0 % 24 / 50 = 48.0 %  Interpretation Moderate disability Severe disability   Interpretation of scores: Score Category Description  0-20% Minimal Disability The patient  can cope with most living activities. Usually no treatment is indicated apart from advice on lifting, sitting and exercise  21-40% Moderate Disability The patient experiences more pain and difficulty with sitting, lifting and standing. Travel and social life are more difficult and they may be disabled from work. Personal care, sexual activity and sleeping are not grossly affected, and the patient can usually be managed by  conservative means  41-60% Severe Disability Pain remains the main problem in this group, but activities of daily living are affected. These patients require a detailed investigation  61-80% Crippled Back pain impinges on all aspects of the patient's life. Positive intervention is required  81-100% Bed-bound These patients are either bed-bound or exaggerating their symptoms  Bluford FORBES Zoe DELENA Karon DELENA, et al. Surgery versus conservative management of stable thoracolumbar fracture: the PRESTO feasibility RCT. [place] ([place]): [employer]; 2021 Nov. [facility] Technology Assessment, No. 25.62.) Appendix 3, Oswestry Disability Index category descriptors. Available from: Findjewel[employer]cz Minimally Clinically Important Difference (MCID) = 12.8%  LEFS  Extreme difficulty/unable (0), Quite a bit of difficulty (1), Moderate difficulty (2), Little difficulty (3), No difficulty (4) Survey date:  05/11/24 07/06/24   Any of your usual work, housework or school activities 1 2  2. Usual hobbies, recreational or sporting activities 2 2  3. Getting into/out of the bath 2 2  4. Walking between rooms 2 4  5. Putting on socks/shoes 2 4  6. Squatting  0 2  7. Lifting an object, like a bag of groceries from the floor 2 3  8. Performing light activities around your home 2 2  9. Performing heavy activities around your home 0 1  10. Getting into/out of a car 2 1  11. Walking 2 blocks 0 1  12. Walking 1 mile 2 0  13. Going up/down 10 stairs (1 flight) 2 3  14. Standing for 1 hour 2 1  15.  sitting for 1 hour 3 4  16. Running on even ground 0 0  17. Running on uneven ground 0 0  18. Making sharp turns while running fast 0 0  19. Hopping  0 0  20. Rolling over in bed 2 3  Score total:   26 / 80 = 32.5 % 35 / 80 = 43.8 %  Functional limitation: Moderate Moderate     SCREENING FOR RED FLAGS: Bowel or bladder incontinence: Yes: chronic bladder issues, bowel urgency -  somewhat worse than before Spinal tumors: No Cauda equina syndrome: No Compression fracture: No Abdominal aneurysm: No  COGNITION:  Overall cognitive status: Within functional limits for tasks assessed    SENSATION: WFL Except B distal LE neuropathy  POSTURE:  rounded shoulders, forward head, right pelvic obliquity, and scoliosis  PALPATION: Increased muscle tension in L>R lumbar paraspinals and R>L glutes and piriformis with TTP over inferior glutes and piriformis near hamstring origin  LUMBAR ROM: *Lumbar ROM assessed with UE support for balance and close SBA/CGA of PT  Active  Eval  Flexion Hands to distal shins  Extension 50% limited  Right lateral flexion Hand to upper thigh - p!  Left lateral flexion Hand to lateral knee  Right rotation 50% limited  Left rotation 30% limited - p!  (Blank rows = not tested)  MUSCLE LENGTH: Hamstrings: mild/mod tight B ITB: mild tight B Piriformis: mod/severe tight B Hip IR: mod tight B Hip flexors: mild tight R Quads: mild tight R>L Heelcord: mild/mod tight B  LOWER EXTREMITY ROM:  Grossly WFL other than limitations indicated above due to muscle tightness   LOWER EXTREMITY MMT:    MMT Right eval Left eval R 07/06/24 L 07/06/24  Hip flexion 3+ 4- 4- 4  Hip extension 4- 4 4 4   Hip abduction 3+ 4- 4 4  Hip adduction 3+ 4- 4- 4  Hip internal rotation 4- 4- 4+ 4  Hip external rotation 3+ 3+ 4 4-  Knee flexion 4 4 4+ 4+  Knee extension 4 4 4+ 4+  Ankle dorsiflexion 3- 3- 3+ 3-  Ankle plantarflexion      Ankle inversion      Ankle eversion       (Blank rows = not tested)  FUNCTIONAL TESTS: (Assessment completed 05/14/24 unless otherwise indicated) 5 times sit to stand: 12.37 sec w/o UE assist - 06/01/24 Timed up and go (TUG): 13.75 sec with single hiking pole 10 meter walk test: 16.00 sec with single hiking pole Gait speed: 2.05 ft/sec with single hiking pole Berg Balance Scale: 42/56; 37-45 significant (>80%) -  05/19/24 Functional gait assessment: 10/30, < 19 = high risk fall    Standardized testing results as of discharge from latest PT episode (11/11/23): 5xSTS = 11.84 sec  = 13.63 sec w/o AD Gait speed = 2.41 ft/sec w/o AD Berg = 45/56; scores of 37-45 indicate significant (>80%) fall risk  FGA = 19/30; scores of 19-24 indicate a medium risk for falls   GAIT: Distance walked: Clinic distances Assistive device utilized: Single walking/trekking pole on R Level of assistance: SBA Gait pattern: Increased sway, step through pattern, decreased stride length, trendelenburg, lateral hip instability, poor foot clearance- Right, and poor foot clearance- Left Comments: B foot slap   TODAY'S TREATMENT:   07/15/2024 THERAPEUTIC EXERCISE: To improve strength and endurance.    Rec Bike - L4 x 6 min  THERAPEUTIC ACTIVITIES: To improve functional performance.  Demonstration, verbal and tactile cues throughout for technique. Standing R hip hike with L foot on 2 block x 10 Standing L hip hike with R foot on 2 block x 5 - limited by increased pain Standing R hip extension diagonal with L foot on 2 block x 10 - mild increased R hip pain Standing L hip extension diagonal with R foot on 2 block x 10 - mod increased R hip pain with more limited ROM Standing R hip extension diagonal with L foot on 2 block and torso leaning pillow + white wedge on elevated mat table x 10 - less pain noted Standing L hip extension diagonal with R foot on 2 block and torso leaning pillow + white wedge on elevated mat table x 10 - less pain noted  Eccentric squat touch to elevated mat table with UE support on back of chair x 10 Lateral step & lift over cone x 10 bil (simulating stepping over edge of tub) - mild increased pain in R buttock with L step-over   07/13/2024  THERAPEUTIC EXERCISE: To improve strength, endurance, ROM, and flexibility.  Demonstration, verbal and tactile cues throughout for technique.  Rec Bike -  L4 x 4 min, L3 x 2 min Manual R hip flexor stretch x 30 Manual R TFL stretch x 30 Manual R ITB stretch x 30 S/L RTB clam x 10 bil Bridge + RTB hip ABD isometric - deferred due to increased R buttock and LE radicular pain Bridge + manual PT resisted hip ABD isometric - deferred due to increased R buttock and LE radicular pain Bridge +  hip ADD ball squeeze isometric - deferred due to increased R buttock and LE radicular pain  MANUAL THERAPY: To promote movement utilizing connective tissue massage, therapeutic massage, manual TP therapy, and myofascial release. STM/DTM, manual TPR to R distal glutes, piriformis, TFL, and ITB  THERAPEUTIC ACTIVITIES: To improve functional performance.  Demonstration, verbal and tactile cues throughout for technique. Standing hip extension diagonal with looped RTB at distal thighs x 8-10 bil (limited due to fatigue) - added to HEP without RTB Standing L/R hip hike with R foot on 2 block 2 x 10 Standing L/R hip circumduction with opp foot standing on 2 block - focusing on maintaining level pelvis x ~10 each side   07/08/2024  THERAPEUTIC EXERCISE: To improve strength, endurance, ROM, and flexibility.  Demonstration, verbal and tactile cues throughout for technique.  Rec Bike - L4 x 3 min, L3 x 3 min - resistance level reduced due to increasing fatigue Supine manual stretches: R hamstring stretch x 30 R mod Thomas quad/hip flexor stretch x 30 R glute cross body stretch x 30 R ITB cross body stretch x 30 R KTOS piriformis stretch x 30 R figure-4 to chest piriformis stretch x 30  NEUROMUSCULAR RE-EDUCATION: To improve coordination, kinesthesia, and proprioception.  Hooklying R sciatic nerve glide x 10 - patient reporting feeling of less motion at toes during nerve glide Supine/long sitting R isometric toe curls into therapist's hand x 10 Attempted long sitting R toe curls into YTB however patient unable to create AROM against resistance  band  MANUAL THERAPY: To promote normalized muscle tension, improved flexibility, and reduced pain utilizing connective tissue massage, therapeutic massage, manual TP therapy, and myofascial release.  STM/DTM, manual TPR, and percussive massage with massage gun to R distal glutes, piriformis, TFL, distal iliopsoas, vastus lateralis and ITB  MODALITIES: (non-billable) Moist heat pack to R glutes x 10' at end of session   07/06/2024 - Recert THERAPEUTIC EXERCISE: To improve strength and endurance.   Rec Bike - L4 x 3 min, L3 x 3 min - resistance level reduced due to increasing fatigue  THERAPEUTIC ACTIVITIES: To improve functional performance.  Demonstration, verbal and tactile cues throughout for technique.  LE MMT - see above LE MMT table LEFS: 35/80 = 43.8% indicating moderate functional limitation Modified Oswestry: 24/50 = 48% or severe disability Gait speed = 2.08 ft/sec with single hiking pole Goal assessment  PHYSICAL PERFORMANCE TEST or MEASUREMENT: Functional Gait  Assessment  Gait Level Surface Walks 20 ft, slow speed, abnormal gait pattern, evidence for imbalance or deviates 10-15 in outside of the 12 in walkway width. Requires more than 7 sec to ambulate 20 ft.   Change in Gait Speed Makes only minor adjustments to walking speed, or accomplishes a change in speed with significant gait deviations, deviates 10-15 in outside the 12 in walkway width, or changes speed but loses balance but is able to recover and continue walking.   Gait with Horizontal Head Turns Performs head turns with moderate changes in gait velocity, slows down, deviates 10-15 in outside 12 in walkway width but recovers, can continue to walk.   Gait with Vertical Head Turns Performs task with slight change in gait velocity (eg, minor disruption to smooth gait path), deviates 6 - 10 in outside 12 in walkway width or uses assistive device   Gait and Pivot Turn Pivot turns safely in greater than 3 sec and stops with  no loss of balance, or pivot turns safely within 3 sec and stops with  mild imbalance, requires small steps to catch balance.   Step Over Obstacle Is able to step over one shoe box (4.5 in total height) without changing gait speed. No evidence of imbalance.   Gait with Narrow Base of Support Ambulates less than 4 steps heel to toe or cannot perform without assistance.   Gait with Eyes Closed Cannot walk 20 ft without assistance, severe gait deviations or imbalance, deviates greater than 15 in outside 12 in walkway width or will not attempt task.   Ambulating Backwards Walks 20 ft, slow speed, abnormal gait pattern, evidence for imbalance, deviates 10-15 in outside 12 in walkway width.   Steps Alternating feet, must use rail.   Total Score 12   FGA comment: < 19 = high risk fall        07/01/2024  THERAPEUTIC EXERCISE: To improve strength, endurance, ROM, and flexibility.  Demonstration, verbal and tactile cues throughout for technique.  Rec Bike - L3 x 6 min Seated 3-way thoracolumbar flexion stretch 2 x 30 each position, using hiking pole for UE support Supine R sciatic nerve glides 2 x 10 bil Hooklying R figure-4 piriformis stretch x 30 Hooklying R KTOS piriformis stretch x 30 - pt noting increased groin anterior thigh discomfort Mod thomas R quad/hip flexor stretch 2 x 30 - 1 rep each w/o and with strap Supine R HS stretch with strap x 30 Supine R ITB stretch with strap x 30   MANUAL THERAPY: To promote normalized muscle tension, improved flexibility, improved joint mobility, and reduced pain utilizing connective tissue massage, therapeutic massage, manual TP therapy, and myofascial release.  STM/DTM and manual TPR to R lower glutes and piriformis   SELF CARE:  Provided education on self-STM/muscle release options and need to break up her travel to [place] with frequent stops to allow her to stretch and walk around.   MODALITIES: (non-billable) Moist heat pack to R glutes x  10' at end of session   06/24/2024 THERAPEUTIC EXERCISE: To improve strength, endurance, ROM, and flexibility.  Demonstration, verbal and tactile cues throughout for technique.  Rec Bike - L3 x 6 min Seated 3-way thoracolumbar flexion stretch 2 x 30 each position, using hiking pole for UE support, clarifying angle for lateral stretches Seated B foot/ankle arch raises x 10 Seated toe curls/towel scrunches - patient with difficulty achieving grip of towel with toes therefore suggested patient try using a small object like a pen or pencil to work on grip with toes at home  NEUROMUSCULAR RE-EDUCATION: To improve coordination, kinesthesia, and reduce nerve irritation. Seated sciatic nerve glides - difficulty coordinating movement  Supine sciatic nerve glides 2 x 10 bil Supine sciatic nerve glides with 90/90 knee flexion x 10 bil - patient reporting better response to previous exercise  THERAPEUTIC ACTIVITIES: To improve functional performance.  Demonstration, verbal and tactile cues throughout for technique. Unsupported mini-squat hovering over mat table with arms forward for balance (chair placed in front for safety with intermittent support) x 10 - attempted in bare feet today as patient feeling like she is losing proprioceptive awareness and control in her feet, however still demonstrating frequent loss of eccentric control/uncontrolled descent onto mat table   06/22/2024 THERAPEUTIC EXERCISE: To improve strength, endurance, and flexibility.  Demonstration, verbal and tactile cues throughout for technique.  Rec Bike - L3 x 6 min Seated 3-way thoracolumbar flexion stretch 2 x 30 each position using hiking pole for UE support  THERAPEUTIC ACTIVITIES: To improve functional performance.  Demonstration, verbal and  tactile cues throughout for technique. Unsupported mini-squat hovering over mat table with arms forward for balance (chair placed in front for safety with intermittent support) x 10 -  more frequent loss of eccentric control/uncontrolled descent onto mat table Unsupported mini-squat with glute touch touch to rounded bolster on mat table with arms forward for balance (chair placed in front for safety with intermittent support) x 10  NEUROMUSCULAR RE-EDUCATION: To improve balance, coordination, kinesthesia, posture, proprioception, and amplitude of movement.  S/L R hip ABD/ext diagonals 2 x 5 - L knee flexed for increased ant/post stability to prevent pt from rolling backward & PT guiding motion for 1st few reps; repeated x 10 with L LE  S/L R hip circumduction arc over L LE tapping R foot in front and behind L foot x 5 - very limited eccentric control with posterior tap; repeated x 10 with L LE   06/08/2024 THERAPEUTIC EXERCISE: To improve strength, endurance, ROM, and flexibility.  Demonstration, verbal and tactile cues throughout for technique. Rec Bike - L3 x 6 min Seated 3-way thoracolumbar flexion stretch 2 x 30 each position - 1st reps with hands on green Pball, 2nd reps using hiking pole for UE support Quadruped 3-way child's pose x 30 each  THERAPEUTIC ACTIVITIES: To improve functional performance.  Demonstration, verbal and tactile cues throughout for technique. Reviewed proper lifting mechanics using squatting motion and golfer's lift  Unsupported mini-squat hovering over mat table with arms forward for balance (chair placed in front for safety but not needed for support) x 8  SELF CARE:  Provided education on managing muscle cramping through stretching, increased hydration, and potential Mg supplement (instructed pt to verify with PCP that there are no concerns or potential medicine interactions with trying Mg supplement).   06/03/2024  THERAPEUTIC EXERCISE: To improve strength, endurance, and flexibility.  Demonstration, verbal and tactile cues throughout for technique.  Rec Bike - L3 x 6 min Standing lateral lean R ITB over back of chair 2 x 30 Supine ITB  stretch with strap 3 x 30 bil  MANUAL THERAPY: To promote normalized muscle tension, improved flexibility, and reduced pain utilizing connective tissue massage, therapeutic massage, manual TP therapy, and myofascial release.  STM/DTM, IASTM with foam roller and manual TPR to R distal glutes, HS and ITB  NEUROMUSCULAR RE-EDUCATION: To improve coordination, kinesthesia, posture, proprioception, and amplitude of movement.  S/L R hip ABD/ext diagonals x 10 - slight PT assist initially to stabilize hips to prevent anterior/posterior rotation & VC/TC to avoid hip ER S/L R hip circumduction arc over L LE tapping R foot in front and behind L foot x 5 - very limited eccentric control with posterior tap   SELF CARE:  HEP update Provided instruction in self-STM techniques to R ITB using rolling pin.    06/01/2024 THERAPEUTIC EXERCISE: To improve strength and endurance.  Demonstration, verbal and tactile cues throughout for technique. NuStep - L4 x 6 min (LE only)  THERAPEUTIC ACTIVITIES: To improve functional performance.  Demonstration, verbal and tactile cues throughout for technique. 5xSTS = 12.37 sec w/o UE assist, somewhat uncontrolled descent esp on last rep  TRIGGER POINT DRY NEEDLING: Treatment instructions/education: Initial Treatment: Pt instructed on Dry Needling rational, procedures, and possible side effects. Pt instructed to expect mild to moderate muscle soreness later in the day and/or into the next day.  Pt instructed in methods to reduce muscle soreness. Pt instructed to continue prescribed HEP. Patient was educated on signs and symptoms of infection and other risk  factors and advised to seek medical attention should they occur.  Patient verbalized understanding of these instructions and education.  Education Handout Provided: Yes Consent: Patient Verbal Consent Given: Yes Treatment: Muscles Treated: R medial and lateral piriformis, R glute medius Utilized skilled palpation  to identify bony landmarks and trigger points along with monitoring of soft tissue during DN. Electrical Stimulation Performed: No Treatment Response/Outcome: Twitch response elicited, Palpable increase in muscle length, Decreased tissue resistance noted, Decreased pain/TTP, and Improved exercise tolerance  MANUAL THERAPY: To promote normalized muscle tension, improved flexibility, and reduced pain. STM/DTM, manual TPR and pin & stretch to muscles addressed with DN   NEUROMUSCULAR RE-EDUCATION: To improve balance, coordination, kinesthesia, posture, proprioception, and amplitude of movement. Mini-squat with counter support x 10   05/19/24 THERAPEUTIC EXERCISE: To improve ROM and flexibility.  Demonstration, verbal and tactile cues throughout for technique.  Bike L3x36min Standing runner stretch x 30 BLE Standing soleus stretch x 30 BLE Table piriformis stretch 2x30 RLE Supine L figure 4 stretch 2x30 LLE Supine glute stretch 2x30 BLE Supine KTOS stretch 2x30 BLE  Seated clams with GTB + trA x 10 BLE Seated marching GTB x + TrA 10 BLE Berg Balance Test  Sit to Stand Able to stand without using hands and stabilize independently   Standing Unsupported Able to stand 2 minutes with supervision   Sitting with Back Unsupported but Feet Supported on Floor or Stool Able to sit safely and securely 2 minutes   Stand to Sit Sits safely with minimal use of hands   Transfers Able to transfer safely, minor use of hands   Standing Unsupported with Eyes Closed Able to stand 10 seconds with supervision   Standing Unsupported with Feet Together Able to place feet together independently and stand 1 minute safely   From Standing, Reach Forward with Outstretched Arm Can reach confidently >25 cm (10)   From Standing Position, Pick up Object from Floor Able to pick up shoe safely and easily   From Standing Position, Turn to Look Behind Over each Shoulder Looks behind one side only/other side shows less  weight shift   Turn 360 Degrees Able to turn 360 degrees safely but slowly   Standing Unsupported, Alternately Place Feet on Step/Stool Able to complete 4 steps without aid or supervision   Standing Unsupported, One Foot in Front Needs help to step but can hold 15 seconds   Standing on One Leg Unable to try or needs assist to prevent fall   Total Score 42       05/14/2024  THERAPEUTIC ACTIVITIES: To improve functional performance.  Demonstration, verbal and tactile cues throughout for technique.  5xSTS: Deferred due to increased pain with uncontrolled descent upon sitting TUG: 13.75 sec with single hiking pole : 16.00 sec with single hiking pole Gait speed: 2.05 ft/sec with single hiking pole  PHYSICAL PERFORMANCE TEST or MEASUREMENT: Functional Gait  Assessment  Gait Level Surface Walks 20 ft, slow speed, abnormal gait pattern, evidence for imbalance or deviates 10-15 in outside of the 12 in walkway width. Requires more than 7 sec to ambulate 20 ft.   Change in Gait Speed Makes only minor adjustments to walking speed, or accomplishes a change in speed with significant gait deviations, deviates 10-15 in outside the 12 in walkway width, or changes speed but loses balance but is able to recover and continue walking.   Gait with Horizontal Head Turns Performs head turns with moderate changes in gait velocity, slows down, deviates 10-15 in  outside 12 in walkway width but recovers, can continue to walk.   Gait with Vertical Head Turns Performs task with moderate change in gait velocity, slows down, deviates 10-15 in outside 12 in walkway width but recovers, can continue to walk.   Gait and Pivot Turn Turns slowly, requires verbal cueing, or requires several small steps to catch balance following turn and stop   Step Over Obstacle Is able to step over one shoe box (4.5 in total height) without changing gait speed. No evidence of imbalance.   Gait with Narrow Base of Support Ambulates less than 4  steps heel to toe or cannot perform without assistance.   Gait with Eyes Closed Cannot walk 20 ft without assistance, severe gait deviations or imbalance, deviates greater than 15 in outside 12 in walkway width or will not attempt task.   Ambulating Backwards Walks 20 ft, slow speed, abnormal gait pattern, evidence for imbalance, deviates 10-15 in outside 12 in walkway width.   Steps Alternating feet, must use rail.   Total Score 10   FGA comment: < 19 = high risk fall      THERAPEUTIC EXERCISE: To improve ROM and flexibility.  Demonstration, verbal and tactile cues throughout for technique.  Hooklying R HS stretch with strap hamstring 2 x 30 Supine gluteus stretch 2 x 30 bil Hooklying figure-4 piriformis stretch x 30 on L, deferred d/t increased pain on R Hooklying KTOS piriformis stretch x 30 on L, deferred d/t increased pain on R Side-sitting R modified pigeon piriformis stretch 2 x 30 - good stretch noted without increased pain Seated R KTOS piriformis stretch 2 x 30 - good stretch noted without increased pain  SELF CARE:   Modified HEP to address concerns regarding increased R buttock/hip pain with piriformis stretching, with patient reporting better tolerance for seated piriformis stretching.  Patient cautioned not to force any painful ROM/stretching and report back to PT next visit if HEP continues to irritate or cause increased pain. Provided instruction in self-STM techniques to R glutes/piriformis using tennis ball against wall or in chair.    05/04/2024  SELF CARE:  Reviewed eval findings and role of PT in addressing identified deficits as well as instruction in initial HEP (see below).    PATIENT EDUCATION:  Education details: continue with current HEP  Person educated: Patient Education method: Explanation Education comprehension: verbalized understanding  HOME EXERCISE PROGRAM: Access Code: [facility] URL: [email or web address] Date:  07/13/2024 Prepared by: Elijah Hidden  Exercises - Standing Soleus Stretch  - 1 x daily - 7 x weekly - 3 sets - 3 reps - 30 sec hold - Hooklying Hamstring Stretch with Strap  - 2 x daily - 7 x weekly - 3 reps - 30 sec hold - Supine Gluteus Stretch  - 2 x daily - 7 x weekly - 3 reps - 30 sec hold - Supine Figure 4 Piriformis Stretch  - 2 x daily - 7 x weekly - 3 reps - 30 sec hold - Supine Piriformis Stretch with Foot on Ground  - 2 x daily - 7 x weekly - 3 reps - 30 sec hold - Seated Table Piriformis Stretch  - 2 x daily - 7 x weekly - 3 reps - 30 sec hold - Seated Piriformis Stretch  - 2 x daily - 7 x weekly - 3 reps - 30 sec hold - Seated March with Resistance  - 1 x daily - 7 x weekly - 2 sets - 10 reps - Seated Isometric  Hip Abduction with Resistance  - 1 x daily - 7 x weekly - 2 sets - 10 reps - Glute Max Release With [employer] Against Wall  - 1 x daily - 7 x weekly - 1-2 min hold - Mini Squat with Counter Support  - 1 x daily - 3 x weekly - 2 sets - 10 reps - 3-5 sec hold - Standing ITB Stretch (Mirrored)  - 2 x daily - 7 x weekly - 3 reps - 30 sec hold - Supine Iliotibial Band Stretch with Strap  - 2 x daily - 7 x weekly - 3 reps - 30 sec hold - Sidelying Diagonal Hip Abduction  - 1 x daily - 3 x weekly - 2 sets - 10 reps - 3 sec hold - Sidelying Hip Abduction and Extension (Mirrored)  - 1 x daily - 3 x weekly - 2 sets - 5 reps - 3 sec hold - Seated 3 Way Exercise Ball Roll Out Stretch  - 1 x daily - 7 x weekly - 2 sets - 3 reps - 30 sec hold - Mini Squat with Chair  - 1 x daily - 3 x weekly - 2 sets - 10 reps - 3 sec hold - Supine Sciatic Nerve Glide  - 1 x daily - 7 x weekly - 2 sets - 10 reps - 3 sec hold - Hip Hiking on Step  - 1 x daily - 3 x weekly - 2 sets - 10 reps - 3 sec hold - Diagonal Hip Extension  - 1 x daily - 3 x weekly - 2 sets - 10 reps - 3 sec hold  Patient Education - Lifting Techniques   ASSESSMENT:  CLINICAL IMPRESSION: Sumner reports she was feeling  better yesterday and was able to go for a walk with her neighbor w/o increased pain, however today she reports increased pain in the same location as in recent visits while walking into the PT clinic, with increased reliance on external support for balance during ambulation noted with patient reaching for therapist arm at times.  Her pain seems to respond better to strengthening of affected muscles rather than stretching or soft tissue mobilization therefore continued strengthening for glutes, especially glutes medius, modifying exercises/positioning to minimize pain with patient able to ambulate with improved stability by end of session.  Berg reassessment deferred today due to increased pain but will hopefully be able to reattempt next visit.  Shyla will benefit from continued skilled PT to address ongoing flexibility, strength and balance deficits deficits to improve mobility and activity tolerance with decreased pain interference and decreased risk for falls.    EVAL: Emryn Bia is a 83 y.o. female who was referred to physical therapy for evaluation and treatment for R hip/buttock pain presumed to be the result of lumbar radiculopathy.  Patient reports onset of R buttock pain beginning Memorial Day weekend 2025.  Pain is worse with walking and positional changes.  Patient has deficits in hip ROM, proximal LE flexibility, B LE strength, abnormal posture, and TTP with abnormal muscle tension in R lower glutes and piriformis which are interfering with ADLs and are impacting quality of life.  On Modified Oswestry patient scored 17/50 demonstrating 34% or moderate disability.  On LEFS patient scored 26/80 demonstrating moderate functional limitation.  Given her significant LE weakness and impaired gait pattern, will plan more formalized balance testing next visit.  Halea will benefit from skilled PT to address above deficits to improve mobility and activity tolerance  with decreased pain interference.      OBJECTIVE IMPAIRMENTS: Abnormal gait, decreased activity tolerance, decreased balance, decreased knowledge of condition, decreased mobility, difficulty walking, decreased ROM, decreased strength, increased fascial restrictions, impaired perceived functional ability, increased muscle spasms, impaired flexibility, impaired sensation, improper body mechanics, postural dysfunction, and pain.   ACTIVITY LIMITATIONS: carrying, lifting, bending, standing, squatting, sleeping, stairs, transfers, bed mobility, bathing, toileting, dressing, reach over head, hygiene/grooming, locomotion level, and caring for others  PARTICIPATION LIMITATIONS: meal prep, cleaning, laundry, driving, shopping, community activity, yard work, and church  PERSONAL FACTORS: Age, Past/current experiences, Time since onset of injury/illness/exacerbation, and 3+ comorbidities: L2-3 XLIF/PSF 05/24/22; C5-7 ACDF 09/21/20; idiopathic progressive neuropathy; hypthyroidism; DDD; remote h/o back surgery at age 9; h/o scoliosis; 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   REHAB POTENTIAL: Good  CLINICAL DECISION MAKING: Unstable/unpredictable  EVALUATION COMPLEXITY: High   GOALS: Goals reviewed with patient? Yes  SHORT TERM GOALS: Target date: 06/15/2024  Patient will be independent with initial HEP to improve outcomes and carryover.  Baseline: Initial HEP provided on eval Goal status: MET - 05/19/24  2.  Patient will report 25% improvement in low back pain to improve QOL. Baseline: 2/10 on eval, up to 10/10 06/03/24 - improvement in pain noted by end of today's session Goal status: MET - 06/08/24 - Pt reports 50+% improvement in pain thus far  3.  Complete standardized balance assessment and update LTG's as indicated. Baseline:  05/14/24 - , TUG and FGA completed; Berg and 5xSTS  pending  05/19/24 GLENWOOD Levins completed Goal status: MET - 06/01/24 - 5xSTS completed  LONG TERM GOALS: Target date: 07/20/2024, extended to 08/31/2024  Patient will be independent with ongoing/advanced HEP for self-management at home.  Baseline:  06/22/24 - HEP reviewed today Goal status: IN PROGRESS - 07/06/24 - no concerns with current HEP  2.  Patient will report 50-75% improvement in R hip/pelvic pain to improve QOL.  Baseline: 2/10 on eval, up to 10/10 06/08/24 - Pt reports 50+% improvement in pain thus far Goal status: IN PROGRESS - 07/06/24 - 50-60% improvement overall, but still having periods of more intense pain  3.  Patient will demonstrate improved B proximal LE strength to >/= 4 to 4+/5 for improved stability and ease of mobility. Baseline: Refer to above LE MMT table Goal status: IN PROGRESS - 07/06/24   4.  Patient will report >/= 35/80 on LEFS (MCID = 9 pts) to demonstrate improved functional ability. Baseline: 26 / 80 = 32.5 % Goal status: MET - 07/06/24 - 35 / 80 = 43.8 %  5. Patient will report </= 22% on Modified Oswestry (MCID = 12%) to demonstrate improved functional ability with decreased pain interference. Baseline: 17 / 50 = 34.0 % Goal status: IN PROGRESS - 07/06/24 - 24 / 50 = 48.0 %  6.  Patient will resume her normal walking routine w/o increased pain to allow for  improved mobility and activity tolerance. Baseline: R ischial pain limiting walking tolerance Goal status: IN PROGRESS - 07/06/24 - attempted to try walking in her neighborhood last week but experienced increased pain while trying to walk up a incline that caused her to have to stop and return home   7.  Patient will improve Berg score by at least 8 points or to >/= 45/56 (baseline as of prior D/C from PT) to improve safety and stability with ADLs in standing  and reduce risk for falls.  Baseline: 42/56 (05/19/24) Goal status: IN PROGRESS   8.  Patient will improve FGA score by at least 4 points or  to >/= 19/30 (baseline as of prior D/C from PT) to improve gait stability and reduce risk for falls.   Baseline: 10/30 (05/14/24) Goal status: IN PROGRESS - 07/06/24 - 12/30  9.  Patient will improve gait velocity to at least 2.62 ft/sec for improved gait efficiency and safety with community ambulation.  Baseline: 2.05 ft/sec with single hiking pole (05/14/24) Goal status: IN PROGRESS - 07/06/24 - 2.08 ft/sec with single hiking pole   PLAN:  PT FREQUENCY: 2x/week  PT DURATION: 8 weeks  PLANNED INTERVENTIONS: 97164- PT Re-evaluation, 97750- Physical Performance Testing, 97110-Therapeutic exercises, 97530- Therapeutic activity, 97112- Neuromuscular re-education, 97535- Self Care, [id number]- Manual therapy, [phone]- Gait training, [phone]- Electrical stimulation (unattended), 97035- Ultrasound, 02966- Ionotophoresis 4mg /ml Dexamethasone, 79439 (1-2 muscles), 20561 (3+ muscles)- Dry Needling, Patient/Family education, Balance training, Stair training, Taping, Joint mobilization, DME instructions, Cryotherapy, and Moist heat  PLAN FOR NEXT SESSION: Reassess Berg; stretch progression as indicated; progress gentle lumbopelvic/proximal LE strengthening as pain allows; MT +/- TPDN to address abnormal muscle tension in glutes and piriformis; potential trial of TENS unit if pain persisting; balance training to decrease fall risk   Elijah CHRISTELLA Hidden, PT 07/15/2024, 3:33 PM

## 2024-07-20 ENCOUNTER — Encounter: Admitting: Physical Therapy

## 2024-07-21 ENCOUNTER — Ambulatory Visit: Admitting: Orthopaedic Surgery

## 2024-07-22 ENCOUNTER — Ambulatory Visit: Admitting: Physical Therapy

## 2024-07-27 ENCOUNTER — Ambulatory Visit: Admitting: Physical Therapy

## 2024-07-27 ENCOUNTER — Encounter: Payer: Self-pay | Admitting: Physical Therapy

## 2024-07-27 DIAGNOSIS — M5416 Radiculopathy, lumbar region: Secondary | ICD-10-CM

## 2024-07-27 DIAGNOSIS — R2689 Other abnormalities of gait and mobility: Secondary | ICD-10-CM

## 2024-07-27 DIAGNOSIS — M25551 Pain in right hip: Secondary | ICD-10-CM

## 2024-07-27 DIAGNOSIS — R2681 Unsteadiness on feet: Secondary | ICD-10-CM

## 2024-07-27 DIAGNOSIS — M6281 Muscle weakness (generalized): Secondary | ICD-10-CM

## 2024-07-27 DIAGNOSIS — R252 Cramp and spasm: Secondary | ICD-10-CM

## 2024-07-27 NOTE — Therapy (Signed)
 OUTPATIENT PHYSICAL THERAPY TREATMENT    Progress Note  Reporting Period 07/06/2024 to 07/27/2024   See note below for Objective Data and Assessment of Progress/Goals.     Patient Name: [name] Trujillo MRN: [id number] DOB:[birth year], 83 y.o., female Today's Date: 07/27/2024  END OF SESSION:  PT End of Session - 07/27/24 1404     Visit Number 14    Date for Recertification  08/31/24    Authorization Type [employer]    Authorization Time Period 07/07/24 - 08/31/24    Authorization - Visit Number 4    Authorization - Number of Visits 8    Progress Note Due on Visit 20    PT Start Time 1404    PT Stop Time 1448    PT Time Calculation (min) 44 min    Activity Tolerance Patient tolerated treatment well    Behavior During Therapy [facility] for tasks assessed/performed                Past Medical History:  Diagnosis Date   High cholesterol    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   She had ACUTE TAKOTSUBO (STRESS) CARDIOMYOPATHY with elevated Troponin Levels - this would be considered Demand Ischemia - Demand Infarction & NOT associated with ACS/CAD.     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  - Cath with NO CAD. ==> RESOLVED: f/u Echo 03/2022: EF 60-65%.   Past Surgical History:  Procedure Laterality Date   APPENDECTOMY     50-18 yo   BACK SURGERY     Age 23   CESAREAN SECTION     ECTOPIC PREGNANCY SURGERY     LAPAROSCOPIC HYSTERECTOMY     LEFT HEART CATH AND CORONARY ANGIOGRAPHY N/A 01/09/2022   Procedure: LEFT HEART CATH AND CORONARY ANGIOGRAPHY;  Surgeon: Wonda Sharper, MD;  Location: [facility] INVASIVE CV LAB;  Service: CV:: Widely patent coronaries with mild  nonobstructive LAD plaquing.  Right dominant system.  Normal LVEDP.  Based on clinical presentation, findings are consistent with acute Takotsubo Cardiomyopathy Syndrome   POSTERIOR FUSION LUMBAR SPINE  05/24/2022   [facility], [place]; [name], MD): L2-L3 XLIF, L2-L3 POSTERIOR DECOMPRESSION AND FUSION   TRANSTHORACIC ECHOCARDIOGRAM  01/08/2022   Severely decreased LV function-EF 25 to 30%.  Mid to apical (mostly anterior) with normal basal motion.  GR 2 DD-elevated LAP.  Mildly dilated LA.  Aortic sclerosis with no stenosis.  No AI.  Normal MV with mild to moderate TR.  Mildly elevated RAP, and PAP (estimated 49 mg). If LAD CAD ruled out - consistent with Takotsubo CM Syndrome.   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) 12/30/2023   New onset atrial fibrillation (HCC) 12/30/2023   Myopia of both eyes with astigmatism and presbyopia 05/06/2023   Dry eye syndrome of both eyes 05/06/2023   Mild tricuspid  regurgitation 05/06/2022   Mild mitral regurgitation 05/06/2022   Hypertension 05/06/2022   History of seizure 05/06/2022   Takotsubo cardiomyopathy-resolved 01/10/2022   Hyperlipidemia with target LDL less than 100 01/10/2022   Hypothyroid 01/10/2022   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) 01/08/2022   Numbness in both legs 04/10/2021   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   Posterior vitreous detachment of both eyes 02/05/2018   Ventricular premature beats 10/31/2015   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   Pure hypercholesterolemia 06/28/2014   Plantar fasciitis 06/28/2014     PCP: [name] Coy, MD   REFERRING PROVIDER: Vernetta Lonni GRADE, MD  REFERRING DIAG:  [phone] (ICD-10-CM) - Pain in right hip  M54.50 (ICD-10-CM) - Low back pain, unspecified  Eval and treat low back/right pelvis and right hip/ishium Any modalities per therapist   THERAPY DIAG:  Radiculopathy, lumbar region  Pain in right hip  Muscle weakness (generalized)  Cramp and spasm  Other abnormalities of gait and mobility  Unsteadiness on feet  RATIONALE FOR EVALUATION AND TREATMENT: Rehabilitation  ONSET DATE: Memorial Day weekend  NEXT MD VISIT: 07/28/2024    SUBJECTIVE:                                                                                                                                                                                                         SUBJECTIVE STATEMENT: [name] Trujillo reports her R hip/buttock pain continues to come and go w/o predictable trigger.  She does feel her R LE is number than it used to be. She has been able to walk some in her neighborhood but is still limiting her distance.  EVAL: [name] Trujillo is well know to me from several prior PT episodes for foot drop, cervical and lumbar radiculopathy.  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  Woke up the next morning with severe R hip/buttock pain.  Pain mostly localized to sciatic and ischial area of her R buttock.  PCP provider told her it was likely hip bursitis but ortho MD feels like it is more likely radicular from her back.  Some relief from steroid injection around her right hip ischial area provided by ortho MD but pain has never fully gone away.   She does have a significant lurch in her gait which is somewhat more pronounced than on prior PT episodes with this PT.  PAIN: Are you having pain? No and Yes: NPRS scale: 0/10   Pain location: R lower  buttock   PERTINENT HISTORY:  L2-3 XLIF/PSF  05/24/22; C5-7 ACDF 09/21/20; idiopathic progressive neuropathy; hypthyroidism; DDD; remote h/o back surgery at age 19; h/o scoliosis; Takotsubo cardiomyopathy 01/10/22; CAD; paroxysmal afib; mitral and tricuspid regurgitation; myalgia due to statin; recent cataract surgery  PRECAUTIONS: Fall  RED FLAGS: Bowel or bladder incontinence: Yes: chronic bladder issues, bowel urgency - somewhat worse than before   WEIGHT BEARING RESTRICTIONS: No  FALLS:  Has patient fallen in last 6 months? No  LIVING ENVIRONMENT: Lives with: lives with their spouse Lives in: House/apartment Stairs: Yes: External: 5-6 steps; on left going up Has following equipment at home: Walker - 2 wheeled, shower chair, and hiking/walking poles  OCCUPATION: Retired runner, broadcasting/film/video  PLOF: Independent and Leisure: walking ~30 minutes 1x/day (3x around the court near where she lives); sewing/quilting; likes working in the yard    PATIENT GOALS: To start walking again w/o doing more damage.   OBJECTIVE: (objective measures completed at initial evaluation unless otherwise dated)  DIAGNOSTIC FINDINGS:  04/21/24 - XR RIGHT HIP: An AP pelvis and lateral of the right hip shows well-maintained joint space with no cortical irregularities around the hip or the trochanteric area of the hip.  The lower lumbar spine shows severe degenerative changes at L4-L5 and L5-S1 on the AP view.   08/27/22 - LUMBAR SPINE - COMPLETE 4+ VIEW FINDINGS: There is mild levoconvex curvature of the lumbar spine, unchanged. Alignment is anatomic. There are bilateral transpedicular screws in posterior fusion rods present at L2-L3, new from prior. Disc spacers present. Alignment is anatomic. No fractures are identified. There is moderate severe disc space narrowing at L4-L5 and L5-S1 as well as L1-L2 similar to the prior study. There are atherosclerotic calcifications of the aorta.   IMPRESSION: 1. Status post posterior fusion at L2-L3. Alignment is anatomic. 2.  Multilevel degenerative disc disease, similar to prior study.  PATIENT SURVEYS:  Modified Oswestry:  MODIFIED OSWESTRY DISABILITY SCALE Date:  05/11/2024 07/06/24  Pain intensity 2 =  Pain medication provides me with complete relief from pain. 2  2. Personal care (washing, dressing, etc.) 1 =  I can take care of myself normally, but it increases my pain. 2  3. Lifting 4 = I can lift only very light weights 4  4. Walking 1 = Pain prevents me from walking more than 1 mile. 3  5. Sitting 1 =  I can only sit in my favorite chair as long as I like. 1  6. Standing 2 =  Pain prevents me from standing more than 1 hour 3  7. Sleeping 2 =  Even when I take pain medication, I sleep less than 6 hours 1  8. Social Life 2 = Pain prevents me from participating in more energetic activities (eg. sports, dancing). 3  9. Traveling 2 =  My pain restricts my travel over 2 hours. 3  10. Employment/ Homemaking 2 = I can perform most of my homemaking/job duties, but pain prevents me from performing more physically stressful activities (eg, lifting, vacuuming). 2  Total 17 / 50 = 34.0 % 24 / 50 = 48.0 %  Interpretation Moderate disability Severe disability   Interpretation of scores: Score Category Description  0-20% Minimal Disability The patient can cope with most living activities. Usually no treatment is indicated apart from advice on lifting, sitting and exercise  21-40% Moderate Disability The patient experiences more pain and difficulty with sitting, lifting and standing. Travel and social life are more difficult and they may be  disabled from work. Personal care, sexual activity and sleeping are not grossly affected, and the patient can usually be managed by conservative means  41-60% Severe Disability Pain remains the main problem in this group, but activities of daily living are affected. These patients require a detailed investigation  61-80% Crippled Back pain impinges on all aspects of the patient's life.  Positive intervention is required  81-100% Bed-bound These patients are either bed-bound or exaggerating their symptoms  Bluford FORBES Zoe DELENA Karon DELENA, et al. Surgery versus conservative management of stable thoracolumbar fracture: the PRESTO feasibility RCT. [place] ([place]): [employer]; 2021 Nov. [facility] Technology Assessment, No. 25.62.) Appendix 3, Oswestry Disability Index category descriptors. Available from: Findjewelers.cz Minimally Clinically Important Difference (MCID) = 12.8%  LEFS  Extreme difficulty/unable (0), Quite a bit of difficulty (1), Moderate difficulty (2), Little difficulty (3), No difficulty (4) Survey date:  05/11/24 07/06/24   Any of your usual work, housework or school activities 1 2  2. Usual hobbies, recreational or sporting activities 2 2  3. Getting into/out of the bath 2 2  4. Walking between rooms 2 4  5. Putting on socks/shoes 2 4  6. Squatting  0 2  7. Lifting an object, like a bag of groceries from the floor 2 3  8. Performing light activities around your home 2 2  9. Performing heavy activities around your home 0 1  10. Getting into/out of a car 2 1  11. Walking 2 blocks 0 1  12. Walking 1 mile 2 0  13. Going up/down 10 stairs (1 flight) 2 3  14. Standing for 1 hour 2 1  15.  sitting for 1 hour 3 4  16. Running on even ground 0 0  17. Running on uneven ground 0 0  18. Making sharp turns while running fast 0 0  19. Hopping  0 0  20. Rolling over in bed 2 3  Score total:   26 / 80 = 32.5 % 35 / 80 = 43.8 %  Functional limitation: Moderate Moderate     SCREENING FOR RED FLAGS: Bowel or bladder incontinence: Yes: chronic bladder issues, bowel urgency - somewhat worse than before Spinal tumors: No Cauda equina syndrome: No Compression fracture: No Abdominal aneurysm: No  COGNITION:  Overall cognitive status: Within functional limits for tasks assessed    SENSATION: WFL Except B distal LE  neuropathy  POSTURE:  rounded shoulders, forward head, right pelvic obliquity, and scoliosis  PALPATION: Increased muscle tension in L>R lumbar paraspinals and R>L glutes and piriformis with TTP over inferior glutes and piriformis near hamstring origin  LUMBAR ROM: *Lumbar ROM assessed with UE support for balance and close SBA/CGA of PT  Active  Eval  Flexion Hands to distal shins  Extension 50% limited  Right lateral flexion Hand to upper thigh - p!  Left lateral flexion Hand to lateral knee  Right rotation 50% limited  Left rotation 30% limited - p!  (Blank rows = not tested)  MUSCLE LENGTH: Hamstrings: mild/mod tight B ITB: mild tight B Piriformis: mod/severe tight B Hip IR: mod tight B Hip flexors: mild tight R Quads: mild tight R>L Heelcord: mild/mod tight B  LOWER EXTREMITY ROM:    Grossly WFL other than limitations indicated above due to muscle tightness   LOWER EXTREMITY MMT:    MMT Right eval Left eval R 07/06/24 L 07/06/24  Hip flexion 3+ 4- 4- 4  Hip extension 4- 4 4 4   Hip abduction 3+  4- 4 4  Hip adduction 3+ 4- 4- 4  Hip internal rotation 4- 4- 4+ 4  Hip external rotation 3+ 3+ 4 4-  Knee flexion 4 4 4+ 4+  Knee extension 4 4 4+ 4+  Ankle dorsiflexion 3- 3- 3+ 3-  Ankle plantarflexion      Ankle inversion      Ankle eversion       (Blank rows = not tested)  FUNCTIONAL TESTS: (Assessment completed 05/14/24 unless otherwise indicated) 5 times sit to stand: 12.37 sec w/o UE assist - 06/01/24 Timed up and go (TUG): 13.75 sec with single hiking pole 10 meter walk test: 16.00 sec with single hiking pole Gait speed: 2.05 ft/sec with single hiking pole Berg Balance Scale: 42/56; 37-45 significant (>80%) - 05/19/24 Functional gait assessment: 10/30, < 19 = high risk fall    Standardized testing results as of discharge from latest PT episode (11/11/23): 5xSTS = 11.84 sec  = 13.63 sec w/o AD Gait speed = 2.41 ft/sec w/o AD Berg = 45/56; scores of 37-45  indicate significant (>80%) fall risk  FGA = 19/30; scores of 19-24 indicate a medium risk for falls   GAIT: Distance walked: Clinic distances Assistive device utilized: Single walking/trekking pole on R Level of assistance: SBA Gait pattern: Increased sway, step through pattern, decreased stride length, trendelenburg, lateral hip instability, poor foot clearance- Right, and poor foot clearance- Left Comments: B foot slap   TODAY'S TREATMENT:   07/27/2024  THERAPEUTIC EXERCISE: To improve strength and endurance.   Rec Bike - L4 x 4', L3 x 2'  PHYSICAL PERFORMANCE TEST or MEASUREMENT: Berg Balance Test  Sit to Stand Able to stand without using hands and stabilize independently   Standing Unsupported Able to stand safely 2 minutes   Sitting with Back Unsupported but Feet Supported on Floor or Stool Able to sit safely and securely 2 minutes   Stand to Sit Sits safely with minimal use of hands   Transfers Able to transfer safely, minor use of hands   Standing Unsupported with Eyes Closed Able to stand 3 seconds   Standing Unsupported with Feet Together Able to place feet together independently but unable to hold for 30 seconds   From Standing, Reach Forward with Outstretched Arm Can reach forward >12 cm safely (5)   From Standing Position, Pick up Object from Floor Able to pick up shoe safely and easily   From Standing Position, Turn to Look Behind Over each Shoulder Turn sideways only but maintains balance   Turn 360 Degrees Able to turn 360 degrees safely one side only in 4 seconds or less   Standing Unsupported, Alternately Place Feet on Step/Stool Able to stand independently and complete 8 steps >20 seconds   Standing Unsupported, One Foot in [employer] balance while stepping or standing   Standing on One Leg Tries to lift leg/unable to hold 3 seconds but remains standing independently   Total Score 40   Berg comment: 37-45 = Significant (>80%) fall risk       THERAPEUTIC  ACTIVITIES: To improve functional performance.  Demonstration, verbal and tactile cues throughout for technique.  = 13.56 sec with single trekking pole, 13.10 sec w/o AD Gait speed = 2.42 ft/sec with single trekking pole, 2.50 ft/sec w/o AD Goal assessment   07/15/2024 THERAPEUTIC EXERCISE: To improve strength and endurance.    Rec Bike - L4 x 6 min  THERAPEUTIC ACTIVITIES: To improve functional performance.  Demonstration, verbal and tactile  cues throughout for technique. Standing R hip hike with L foot on 2 block x 10 Standing L hip hike with R foot on 2 block x 5 - limited by increased pain Standing R hip extension diagonal with L foot on 2 block x 10 - mild increased R hip pain Standing L hip extension diagonal with R foot on 2 block x 10 - mod increased R hip pain with more limited ROM Standing R hip extension diagonal with L foot on 2 block and torso leaning pillow + white wedge on elevated mat table x 10 - less pain noted Standing L hip extension diagonal with R foot on 2 block and torso leaning pillow + white wedge on elevated mat table x 10 - less pain noted  Eccentric squat touch to elevated mat table with UE support on back of chair x 10 Lateral step & lift over cone x 10 bil (simulating stepping over edge of tub) - mild increased pain in R buttock with L step-over   07/13/2024  THERAPEUTIC EXERCISE: To improve strength, endurance, ROM, and flexibility.  Demonstration, verbal and tactile cues throughout for technique.  Rec Bike - L4 x 4 min, L3 x 2 min Manual R hip flexor stretch x 30 Manual R TFL stretch x 30 Manual R ITB stretch x 30 S/L RTB clam x 10 bil Bridge + RTB hip ABD isometric - deferred due to increased R buttock and LE radicular pain Bridge + manual PT resisted hip ABD isometric - deferred due to increased R buttock and LE radicular pain Bridge + hip ADD ball squeeze isometric - deferred due to increased R buttock and LE radicular pain  MANUAL  THERAPY: To promote movement utilizing connective tissue massage, therapeutic massage, manual TP therapy, and myofascial release. STM/DTM, manual TPR to R distal glutes, piriformis, TFL, and ITB  THERAPEUTIC ACTIVITIES: To improve functional performance.  Demonstration, verbal and tactile cues throughout for technique. Standing hip extension diagonal with looped RTB at distal thighs x 8-10 bil (limited due to fatigue) - added to HEP without RTB Standing L/R hip hike with R foot on 2 block 2 x 10 Standing L/R hip circumduction with opp foot standing on 2 block - focusing on maintaining level pelvis x ~10 each side   07/08/2024  THERAPEUTIC EXERCISE: To improve strength, endurance, ROM, and flexibility.  Demonstration, verbal and tactile cues throughout for technique.  Rec Bike - L4 x 3 min, L3 x 3 min - resistance level reduced due to increasing fatigue Supine manual stretches: R hamstring stretch x 30 R mod Thomas quad/hip flexor stretch x 30 R glute cross body stretch x 30 R ITB cross body stretch x 30 R KTOS piriformis stretch x 30 R figure-4 to chest piriformis stretch x 30  NEUROMUSCULAR RE-EDUCATION: To improve coordination, kinesthesia, and proprioception.  Hooklying R sciatic nerve glide x 10 - patient reporting feeling of less motion at toes during nerve glide Supine/long sitting R isometric toe curls into therapist's hand x 10 Attempted long sitting R toe curls into YTB however patient unable to create AROM against resistance band  MANUAL THERAPY: To promote normalized muscle tension, improved flexibility, and reduced pain utilizing connective tissue massage, therapeutic massage, manual TP therapy, and myofascial release.  STM/DTM, manual TPR, and percussive massage with massage gun to R distal glutes, piriformis, TFL, distal iliopsoas, vastus lateralis and ITB  MODALITIES: (non-billable) Moist heat pack to R glutes x 10' at end of session   07/06/2024 -  Recert  THERAPEUTIC EXERCISE: To improve strength and endurance.   Rec Bike - L4 x 3 min, L3 x 3 min - resistance level reduced due to increasing fatigue  THERAPEUTIC ACTIVITIES: To improve functional performance.  Demonstration, verbal and tactile cues throughout for technique.  LE MMT - see above LE MMT table LEFS: 35/80 = 43.8% indicating moderate functional limitation Modified Oswestry: 24/50 = 48% or severe disability Gait speed = 2.08 ft/sec with single hiking pole Goal assessment  PHYSICAL PERFORMANCE TEST or MEASUREMENT: Functional Gait  Assessment  Gait Level Surface Walks 20 ft, slow speed, abnormal gait pattern, evidence for imbalance or deviates 10-15 in outside of the 12 in walkway width. Requires more than 7 sec to ambulate 20 ft.   Change in Gait Speed Makes only minor adjustments to walking speed, or accomplishes a change in speed with significant gait deviations, deviates 10-15 in outside the 12 in walkway width, or changes speed but loses balance but is able to recover and continue walking.   Gait with Horizontal Head Turns Performs head turns with moderate changes in gait velocity, slows down, deviates 10-15 in outside 12 in walkway width but recovers, can continue to walk.   Gait with Vertical Head Turns Performs task with slight change in gait velocity (eg, minor disruption to smooth gait path), deviates 6 - 10 in outside 12 in walkway width or uses assistive device   Gait and Pivot Turn Pivot turns safely in greater than 3 sec and stops with no loss of balance, or pivot turns safely within 3 sec and stops with mild imbalance, requires small steps to catch balance.   Step Over Obstacle Is able to step over one shoe box (4.5 in total height) without changing gait speed. No evidence of imbalance.   Gait with Narrow Base of Support Ambulates less than 4 steps heel to toe or cannot perform without assistance.   Gait with Eyes Closed Cannot walk 20 ft without assistance, severe  gait deviations or imbalance, deviates greater than 15 in outside 12 in walkway width or will not attempt task.   Ambulating Backwards Walks 20 ft, slow speed, abnormal gait pattern, evidence for imbalance, deviates 10-15 in outside 12 in walkway width.   Steps Alternating feet, must use rail.   Total Score 12   FGA comment: < 19 = high risk fall        07/01/2024  THERAPEUTIC EXERCISE: To improve strength, endurance, ROM, and flexibility.  Demonstration, verbal and tactile cues throughout for technique.  Rec Bike - L3 x 6 min Seated 3-way thoracolumbar flexion stretch 2 x 30 each position, using hiking pole for UE support Supine R sciatic nerve glides 2 x 10 bil Hooklying R figure-4 piriformis stretch x 30 Hooklying R KTOS piriformis stretch x 30 - pt noting increased groin anterior thigh discomfort Mod thomas R quad/hip flexor stretch 2 x 30 - 1 rep each w/o and with strap Supine R HS stretch with strap x 30 Supine R ITB stretch with strap x 30   MANUAL THERAPY: To promote normalized muscle tension, improved flexibility, improved joint mobility, and reduced pain utilizing connective tissue massage, therapeutic massage, manual TP therapy, and myofascial release.  STM/DTM and manual TPR to R lower glutes and piriformis   SELF CARE:  Provided education on self-STM/muscle release options and need to break up her travel to [place] with frequent stops to allow her to stretch and walk around.   MODALITIES: (non-billable) Moist heat pack to R glutes  x 10' at end of session   06/24/2024 THERAPEUTIC EXERCISE: To improve strength, endurance, ROM, and flexibility.  Demonstration, verbal and tactile cues throughout for technique.  Rec Bike - L3 x 6 min Seated 3-way thoracolumbar flexion stretch 2 x 30 each position, using hiking pole for UE support, clarifying angle for lateral stretches Seated B foot/ankle arch raises x 10 Seated toe curls/towel scrunches - patient with  difficulty achieving grip of towel with toes therefore suggested patient try using a small object like a pen or pencil to work on grip with toes at home  NEUROMUSCULAR RE-EDUCATION: To improve coordination, kinesthesia, and reduce nerve irritation. Seated sciatic nerve glides - difficulty coordinating movement  Supine sciatic nerve glides 2 x 10 bil Supine sciatic nerve glides with 90/90 knee flexion x 10 bil - patient reporting better response to previous exercise  THERAPEUTIC ACTIVITIES: To improve functional performance.  Demonstration, verbal and tactile cues throughout for technique. Unsupported mini-squat hovering over mat table with arms forward for balance (chair placed in front for safety with intermittent support) x 10 - attempted in bare feet today as patient feeling like she is losing proprioceptive awareness and control in her feet, however still demonstrating frequent loss of eccentric control/uncontrolled descent onto mat table   06/22/2024 THERAPEUTIC EXERCISE: To improve strength, endurance, and flexibility.  Demonstration, verbal and tactile cues throughout for technique.  Rec Bike - L3 x 6 min Seated 3-way thoracolumbar flexion stretch 2 x 30 each position using hiking pole for UE support  THERAPEUTIC ACTIVITIES: To improve functional performance.  Demonstration, verbal and tactile cues throughout for technique. Unsupported mini-squat hovering over mat table with arms forward for balance (chair placed in front for safety with intermittent support) x 10 - more frequent loss of eccentric control/uncontrolled descent onto mat table Unsupported mini-squat with glute touch touch to rounded bolster on mat table with arms forward for balance (chair placed in front for safety with intermittent support) x 10  NEUROMUSCULAR RE-EDUCATION: To improve balance, coordination, kinesthesia, posture, proprioception, and amplitude of movement.  S/L R hip ABD/ext diagonals 2 x 5 - L knee flexed  for increased ant/post stability to prevent pt from rolling backward & PT guiding motion for 1st few reps; repeated x 10 with L LE  S/L R hip circumduction arc over L LE tapping R foot in front and behind L foot x 5 - very limited eccentric control with posterior tap; repeated x 10 with L LE   06/08/2024 THERAPEUTIC EXERCISE: To improve strength, endurance, ROM, and flexibility.  Demonstration, verbal and tactile cues throughout for technique. Rec Bike - L3 x 6 min Seated 3-way thoracolumbar flexion stretch 2 x 30 each position - 1st reps with hands on green Pball, 2nd reps using hiking pole for UE support Quadruped 3-way child's pose x 30 each  THERAPEUTIC ACTIVITIES: To improve functional performance.  Demonstration, verbal and tactile cues throughout for technique. Reviewed proper lifting mechanics using squatting motion and golfer's lift  Unsupported mini-squat hovering over mat table with arms forward for balance (chair placed in front for safety but not needed for support) x 8  SELF CARE:  Provided education on managing muscle cramping through stretching, increased hydration, and potential Mg supplement (instructed pt to verify with PCP that there are no concerns or potential medicine interactions with trying Mg supplement).   06/03/2024  THERAPEUTIC EXERCISE: To improve strength, endurance, and flexibility.  Demonstration, verbal and tactile cues throughout for technique.  Rec Bike - L3 x 6  min Standing lateral lean R ITB over back of chair 2 x 30 Supine ITB stretch with strap 3 x 30 bil  MANUAL THERAPY: To promote normalized muscle tension, improved flexibility, and reduced pain utilizing connective tissue massage, therapeutic massage, manual TP therapy, and myofascial release.  STM/DTM, IASTM with foam roller and manual TPR to R distal glutes, HS and ITB  NEUROMUSCULAR RE-EDUCATION: To improve coordination, kinesthesia, posture, proprioception, and amplitude of movement.  S/L R  hip ABD/ext diagonals x 10 - slight PT assist initially to stabilize hips to prevent anterior/posterior rotation & VC/TC to avoid hip ER S/L R hip circumduction arc over L LE tapping R foot in front and behind L foot x 5 - very limited eccentric control with posterior tap   SELF CARE:  HEP update Provided instruction in self-STM techniques to R ITB using rolling pin.    06/01/2024 THERAPEUTIC EXERCISE: To improve strength and endurance.  Demonstration, verbal and tactile cues throughout for technique. NuStep - L4 x 6 min (LE only)  THERAPEUTIC ACTIVITIES: To improve functional performance.  Demonstration, verbal and tactile cues throughout for technique. 5xSTS = 12.37 sec w/o UE assist, somewhat uncontrolled descent esp on last rep  TRIGGER POINT DRY NEEDLING: Treatment instructions/education: Initial Treatment: Pt instructed on Dry Needling rational, procedures, and possible side effects. Pt instructed to expect mild to moderate muscle soreness later in the day and/or into the next day.  Pt instructed in methods to reduce muscle soreness. Pt instructed to continue prescribed HEP. Patient was educated on signs and symptoms of infection and other risk factors and advised to seek medical attention should they occur.  Patient verbalized understanding of these instructions and education.  Education Handout Provided: Yes Consent: Patient Verbal Consent Given: Yes Treatment: Muscles Treated: R medial and lateral piriformis, R glute medius Utilized skilled palpation to identify bony landmarks and trigger points along with monitoring of soft tissue during DN. Electrical Stimulation Performed: No Treatment Response/Outcome: Twitch response elicited, Palpable increase in muscle length, Decreased tissue resistance noted, Decreased pain/TTP, and Improved exercise tolerance  MANUAL THERAPY: To promote normalized muscle tension, improved flexibility, and reduced pain. STM/DTM, manual TPR and pin &  stretch to muscles addressed with DN   NEUROMUSCULAR RE-EDUCATION: To improve balance, coordination, kinesthesia, posture, proprioception, and amplitude of movement. Mini-squat with counter support x 10   05/19/24 THERAPEUTIC EXERCISE: To improve ROM and flexibility.  Demonstration, verbal and tactile cues throughout for technique.  Bike L3x54min Standing runner stretch x 30 BLE Standing soleus stretch x 30 BLE Table piriformis stretch 2x30 RLE Supine L figure 4 stretch 2x30 LLE Supine glute stretch 2x30 BLE Supine KTOS stretch 2x30 BLE  Seated clams with GTB + trA x 10 BLE Seated marching GTB x + TrA 10 BLE Berg Balance Test  Sit to Stand Able to stand without using hands and stabilize independently   Standing Unsupported Able to stand 2 minutes with supervision   Sitting with Back Unsupported but Feet Supported on Floor or Stool Able to sit safely and securely 2 minutes   Stand to Sit Sits safely with minimal use of hands   Transfers Able to transfer safely, minor use of hands   Standing Unsupported with Eyes Closed Able to stand 10 seconds with supervision   Standing Unsupported with Feet Together Able to place feet together independently and stand 1 minute safely   From Standing, Reach Forward with Outstretched Arm Can reach confidently >25 cm (10)   From Standing Position, Pick up  Object from Floor Able to pick up shoe safely and easily   From Standing Position, Turn to Look Behind Over each Shoulder Looks behind one side only/other side shows less weight shift   Turn 360 Degrees Able to turn 360 degrees safely but slowly   Standing Unsupported, Alternately Place Feet on Step/Stool Able to complete 4 steps without aid or supervision   Standing Unsupported, One Foot in Front Needs help to step but can hold 15 seconds   Standing on One Leg Unable to try or needs assist to prevent fall   Total Score 42       05/14/2024  THERAPEUTIC ACTIVITIES: To improve functional  performance.  Demonstration, verbal and tactile cues throughout for technique.  5xSTS: Deferred due to increased pain with uncontrolled descent upon sitting TUG: 13.75 sec with single hiking pole : 16.00 sec with single hiking pole Gait speed: 2.05 ft/sec with single hiking pole  PHYSICAL PERFORMANCE TEST or MEASUREMENT: Functional Gait  Assessment  Gait Level Surface Walks 20 ft, slow speed, abnormal gait pattern, evidence for imbalance or deviates 10-15 in outside of the 12 in walkway width. Requires more than 7 sec to ambulate 20 ft.   Change in Gait Speed Makes only minor adjustments to walking speed, or accomplishes a change in speed with significant gait deviations, deviates 10-15 in outside the 12 in walkway width, or changes speed but loses balance but is able to recover and continue walking.   Gait with Horizontal Head Turns Performs head turns with moderate changes in gait velocity, slows down, deviates 10-15 in outside 12 in walkway width but recovers, can continue to walk.   Gait with Vertical Head Turns Performs task with moderate change in gait velocity, slows down, deviates 10-15 in outside 12 in walkway width but recovers, can continue to walk.   Gait and Pivot Turn Turns slowly, requires verbal cueing, or requires several small steps to catch balance following turn and stop   Step Over Obstacle Is able to step over one shoe box (4.5 in total height) without changing gait speed. No evidence of imbalance.   Gait with Narrow Base of Support Ambulates less than 4 steps heel to toe or cannot perform without assistance.   Gait with Eyes Closed Cannot walk 20 ft without assistance, severe gait deviations or imbalance, deviates greater than 15 in outside 12 in walkway width or will not attempt task.   Ambulating Backwards Walks 20 ft, slow speed, abnormal gait pattern, evidence for imbalance, deviates 10-15 in outside 12 in walkway width.   Steps Alternating feet, must use rail.    Total Score 10   FGA comment: < 19 = high risk fall      THERAPEUTIC EXERCISE: To improve ROM and flexibility.  Demonstration, verbal and tactile cues throughout for technique.  Hooklying R HS stretch with strap hamstring 2 x 30 Supine gluteus stretch 2 x 30 bil Hooklying figure-4 piriformis stretch x 30 on L, deferred d/t increased pain on R Hooklying KTOS piriformis stretch x 30 on L, deferred d/t increased pain on R Side-sitting R modified pigeon piriformis stretch 2 x 30 - good stretch noted without increased pain Seated R KTOS piriformis stretch 2 x 30 - good stretch noted without increased pain  SELF CARE:   Modified HEP to address concerns regarding increased R buttock/hip pain with piriformis stretching, with patient reporting better tolerance for seated piriformis stretching.  Patient cautioned not to force any painful ROM/stretching and report back to PT  next visit if HEP continues to irritate or cause increased pain. Provided instruction in self-STM techniques to R glutes/piriformis using tennis ball against wall or in chair.    05/04/2024  SELF CARE:  Reviewed eval findings and role of PT in addressing identified deficits as well as instruction in initial HEP (see below).    PATIENT EDUCATION:  Education details: continue with current HEP  Person educated: Patient Education method: Explanation Education comprehension: verbalized understanding  HOME EXERCISE PROGRAM: Access Code: [facility] URL: [email or web address] Date: 07/13/2024 Prepared by: Elijah Hidden  Exercises - Standing Soleus Stretch  - 1 x daily - 7 x weekly - 3 sets - 3 reps - 30 sec hold - Hooklying Hamstring Stretch with Strap  - 2 x daily - 7 x weekly - 3 reps - 30 sec hold - Supine Gluteus Stretch  - 2 x daily - 7 x weekly - 3 reps - 30 sec hold - Supine Figure 4 Piriformis Stretch  - 2 x daily - 7 x weekly - 3 reps - 30 sec hold - Supine Piriformis Stretch with Foot on Ground  - 2  x daily - 7 x weekly - 3 reps - 30 sec hold - Seated Table Piriformis Stretch  - 2 x daily - 7 x weekly - 3 reps - 30 sec hold - Seated Piriformis Stretch  - 2 x daily - 7 x weekly - 3 reps - 30 sec hold - Seated March with Resistance  - 1 x daily - 7 x weekly - 2 sets - 10 reps - Seated Isometric Hip Abduction with Resistance  - 1 x daily - 7 x weekly - 2 sets - 10 reps - Glute Max Release With [employer] Against Wall  - 1 x daily - 7 x weekly - 1-2 min hold - Mini Squat with Counter Support  - 1 x daily - 3 x weekly - 2 sets - 10 reps - 3-5 sec hold - Standing ITB Stretch (Mirrored)  - 2 x daily - 7 x weekly - 3 reps - 30 sec hold - Supine Iliotibial Band Stretch with Strap  - 2 x daily - 7 x weekly - 3 reps - 30 sec hold - Sidelying Diagonal Hip Abduction  - 1 x daily - 3 x weekly - 2 sets - 10 reps - 3 sec hold - Sidelying Hip Abduction and Extension (Mirrored)  - 1 x daily - 3 x weekly - 2 sets - 5 reps - 3 sec hold - Seated 3 Way Exercise Ball Roll Out Stretch  - 1 x daily - 7 x weekly - 2 sets - 3 reps - 30 sec hold - Mini Squat with Chair  - 1 x daily - 3 x weekly - 2 sets - 10 reps - 3 sec hold - Supine Sciatic Nerve Glide  - 1 x daily - 7 x weekly - 2 sets - 10 reps - 3 sec hold - Hip Hiking on Step  - 1 x daily - 3 x weekly - 2 sets - 10 reps - 3 sec hold - Diagonal Hip Extension  - 1 x daily - 3 x weekly - 2 sets - 10 reps - 3 sec hold  Patient Education - Lifting Techniques   ASSESSMENT:  CLINICAL IMPRESSION: Dejae reports her pain continues to fluctuate with some good days and some more painful days, although pain has been less common and severe of late with 50% improvement in overall pain noted.  Decreased pain allowing for increased gait speed, with current gait speed 2.42 ft/sec with single hiking pole and 2.50 ft/sec without AD.  She reports increasing numbness in her R LE which is affecting her proprioception and balance.  As such, her overall Berg score declined to  40/56 although increased variability also noted during individual item testing.  Recent MMT revealing overall strength gains since start of PT but continued weakness evident throughout B LE musculature.  In most respects Takya is progressing with PT despite variability in pain and activity tolerance, and she will benefit from continued skilled PT to address ongoing flexibility, strength and balance deficits deficits to improve mobility and activity tolerance with decreased pain interference and decreased risk for falls.    EVAL: Jozette Manigo is a 83 y.o. female who was referred to physical therapy for evaluation and treatment for R hip/buttock pain presumed to be the result of lumbar radiculopathy.  Patient reports onset of R buttock pain beginning Memorial Day weekend 2025.  Pain is worse with walking and positional changes.  Patient has deficits in hip ROM, proximal LE flexibility, B LE strength, abnormal posture, and TTP with abnormal muscle tension in R lower glutes and piriformis which are interfering with ADLs and are impacting quality of life.  On Modified Oswestry patient scored 17/50 demonstrating 34% or moderate disability.  On LEFS patient scored 26/80 demonstrating moderate functional limitation.  Given her significant LE weakness and impaired gait pattern, will plan more formalized balance testing next visit.  Hadlea will benefit from skilled PT to address above deficits to improve mobility and activity tolerance with decreased pain interference.     OBJECTIVE IMPAIRMENTS: Abnormal gait, decreased activity tolerance, decreased balance, decreased knowledge of condition, decreased mobility, difficulty walking, decreased ROM, decreased strength, increased fascial restrictions, impaired perceived functional ability, increased muscle spasms, impaired flexibility, impaired sensation, improper body mechanics, postural dysfunction, and pain.   ACTIVITY LIMITATIONS: carrying, lifting, bending,  standing, squatting, sleeping, stairs, transfers, bed mobility, bathing, toileting, dressing, reach over head, hygiene/grooming, locomotion level, and caring for others  PARTICIPATION LIMITATIONS: meal prep, cleaning, laundry, driving, shopping, community activity, yard work, and church  PERSONAL FACTORS: Age, Past/current experiences, Time since onset of injury/illness/exacerbation, and 3+ comorbidities: L2-3 XLIF/PSF 05/24/22; C5-7 ACDF 09/21/20; idiopathic progressive neuropathy; hypthyroidism; DDD; remote h/o back surgery at age 19; h/o scoliosis; 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   REHAB POTENTIAL: Good  CLINICAL DECISION MAKING: Unstable/unpredictable  EVALUATION COMPLEXITY: High   GOALS: Goals reviewed with patient? Yes  SHORT TERM GOALS: Target date: 06/15/2024  Patient will be independent with initial HEP to improve outcomes and carryover.  Baseline: Initial HEP provided on eval Goal status: MET - 05/19/24  2.  Patient will report 25% improvement in low back pain to improve QOL. Baseline: 2/10 on eval, up to 10/10 06/03/24 - improvement in pain noted by end of today's session Goal status: MET - 06/08/24 - Pt reports 50+% improvement in pain thus far  3.  Complete standardized balance assessment and update LTG's as indicated. Baseline:  05/14/24 - , TUG and FGA completed; Berg and 5xSTS pending  05/19/24 [facility]ENWO[facility] Levins completed Goal status: MET - 06/01/24 - 5xSTS completed  LONG TERM GOALS: Target date: 07/20/2024, extended to 08/31/2024  Patient will be independent with ongoing/advanced HEP for self-management at home.  Baseline:  06/22/24 - HEP reviewed today Goal status: IN PROGRESS - 07/06/24 -  no concerns with current HEP  2.  Patient will report 50-75% improvement in R hip/pelvic pain to improve QOL.  Baseline: 2/10 on  eval, up to 10/10 06/08/24 - Pt reports 50+% improvement in pain thus far 07/06/24 - 50-60% improvement overall, but still having periods of more intense pain Goal status: IN PROGRESS - 07/27/24 - still at least 50% improvement overall, but pain still fluctuating   3.  Patient will demonstrate improved B proximal LE strength to >/= 4 to 4+/5 for improved stability and ease of mobility. Baseline: Refer to above LE MMT table Goal status: IN PROGRESS - 07/06/24   4.  Patient will report >/= 35/80 on LEFS (MCID = 9 pts) to demonstrate improved functional ability. Baseline: 26 / 80 = 32.5 % Goal status: MET - 07/06/24 - 35 / 80 = 43.8 %  5. Patient will report </= 22% on Modified Oswestry (MCID = 12%) to demonstrate improved functional ability with decreased pain interference. Baseline: 17 / 50 = 34.0 % Goal status: IN PROGRESS - 07/06/24 - 24 / 50 = 48.0 %  6.  Patient will resume her normal walking routine w/o increased pain to allow for  improved mobility and activity tolerance. Baseline: R ischial pain limiting walking tolerance 07/06/24 - attempted to try walking in her neighborhood last week but experienced increased pain while trying to walk up a incline that caused her to have to stop and return home  Goal status: IN PROGRESS - 07/27/24 - patient reports she has been walking ~1/4 of her normal distance staying on level ground and not walking nearly as often as normal (was usually walking daily)  7.  Patient will improve Berg score by at least 8 points or to >/= 45/56 (baseline as of prior D/C from PT) to improve safety and stability with ADLs in standing and reduce risk for falls.  Baseline: 42/56 (05/19/24) Goal status: IN PROGRESS - 07/27/24 - individual item scores fluctuating but overall score decreased by 2 points to 40/56  8.  Patient will improve FGA score by at least 4 points or to >/= 19/30 (baseline as of prior D/C from PT) to improve gait stability and reduce risk for falls.    Baseline: 10/30 (05/14/24) Goal status: IN PROGRESS - 07/06/24 - 12/30  9.  Patient will improve gait velocity to at least 2.62 ft/sec for improved gait efficiency and safety with community ambulation.  Baseline: 2.05 ft/sec with single hiking pole (05/14/24) 07/06/24 - 2.08 ft/sec with single hiking pole Goal status: IN PROGRESS - 07/27/24 - 07/27/24 - 2.42 ft/sec with single trekking pole, 2.50 ft/sec w/o AD   PLAN:  PT FREQUENCY: 2x/week  PT DURATION: 8 weeks  PLANNED INTERVENTIONS: 97164- PT Re-evaluation, 97750- Physical Performance Testing, 97110-Therapeutic exercises, 97530- Therapeutic activity, 97112- Neuromuscular re-education, 97535- Self Care, [id number]- Manual therapy, [id number]- Gait training, [phone]- Electrical stimulation (unattended), 97035- Ultrasound, 02966- Ionotophoresis 4mg /ml Dexamethasone, 79439 (1-2 muscles), 20561 (3+ muscles)- Dry Needling, Patient/Family education, Balance training, Stair training, Taping, Joint mobilization, DME instructions, Cryotherapy, and Moist heat  PLAN FOR NEXT SESSION: stretch progression as indicated; progress gentle lumbopelvic/proximal LE strengthening as pain allows; MT +/- TPDN to address abnormal muscle tension in glutes and piriformis; potential trial of TENS unit if pain persisting; balance training to decrease fall risk   Elijah CHRISTELLA Hidden, PT 07/27/2024, 3:04 PM

## 2024-07-28 ENCOUNTER — Other Ambulatory Visit: Payer: Self-pay

## 2024-07-28 ENCOUNTER — Encounter: Payer: Self-pay | Admitting: Orthopaedic Surgery

## 2024-07-28 ENCOUNTER — Ambulatory Visit: Admitting: Orthopaedic Surgery

## 2024-07-28 DIAGNOSIS — M5441 Lumbago with sciatica, right side: Secondary | ICD-10-CM | POA: Diagnosis not present

## 2024-07-28 DIAGNOSIS — M25551 Pain in right hip: Secondary | ICD-10-CM

## 2024-07-28 DIAGNOSIS — G8929 Other chronic pain: Secondary | ICD-10-CM | POA: Diagnosis not present

## 2024-07-28 NOTE — Progress Notes (Addendum)
 The patient is a very active 83 year old female that we have been seeing for a while as a relates to significant right sided ischial pain.  She has been through physical therapy and even had a steroid injection around her ischial area.  However now she is developing right leg and foot weakness as well as numbness and tingling.  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  When we were first treating her we felt this was likely a chronic hamstring issue given her ischial pain but now this seems to be more sciatic related and radicular related that is getting worse.  On exam she does have a positive straight leg raise now on the right side that she did not have before.  She also has weakness with foot plantarflexion and dorsiflexion of the right side and subjective numbness and tingling in the L4 and L5 distribution.  All this is new and seems to be slightly getting worse.  At this point it is medically necessary we obtain an MRI of her lumbar spine to rule out nerve compression.  She has now been through rest, anti-inflammatory medications, steroid injection and walking with assistive device.  She is also been through 6 weeks of physical therapy as well and with her radicular symptoms worsening, a MRI is definitely warranted of the lumbar spine.  She agrees with this treatment plan.  We will see her in follow-up once we have that MRI.

## 2024-07-29 ENCOUNTER — Ambulatory Visit: Admitting: Physical Therapy

## 2024-07-29 ENCOUNTER — Encounter: Payer: Self-pay | Admitting: Physical Therapy

## 2024-07-29 DIAGNOSIS — M5416 Radiculopathy, lumbar region: Secondary | ICD-10-CM

## 2024-07-29 DIAGNOSIS — R2689 Other abnormalities of gait and mobility: Secondary | ICD-10-CM

## 2024-07-29 DIAGNOSIS — R2681 Unsteadiness on feet: Secondary | ICD-10-CM

## 2024-07-29 DIAGNOSIS — M6281 Muscle weakness (generalized): Secondary | ICD-10-CM

## 2024-07-29 DIAGNOSIS — R252 Cramp and spasm: Secondary | ICD-10-CM

## 2024-07-29 DIAGNOSIS — M25551 Pain in right hip: Secondary | ICD-10-CM

## 2024-07-29 NOTE — Therapy (Signed)
 OUTPATIENT PHYSICAL THERAPY TREATMENT       Patient Name: [name] Trujillo MRN: [id number] DOB:02-May-1941, 83 y.o., female Today's Date: 07/29/2024  END OF SESSION:  PT End of Session - 07/29/24 1446     Visit Number 15    Date for Recertification  08/31/24    Authorization Type [employer]    Authorization Time Period 07/07/24 - 08/31/24    Authorization - Visit Number 5    Authorization - Number of Visits 8    Progress Note Due on Visit 24   MD PN for 07/28/24 on visit #14 (07/27/24)   PT Start Time 1446    PT Stop Time 1531    PT Time Calculation (min) 45 min    Activity Tolerance Patient tolerated treatment well    Behavior During Therapy [facility] for tasks assessed/performed                 Past Medical History:  Diagnosis Date   High cholesterol    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   She had ACUTE TAKOTSUBO (STRESS) CARDIOMYOPATHY with elevated Troponin Levels - this would be considered Demand Ischemia - Demand Infarction & NOT associated with ACS/CAD.     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  - Cath with NO CAD. ==> RESOLVED: f/u Echo 03/2022: EF 60-65%.   Past Surgical History:  Procedure Laterality Date   APPENDECTOMY     60-18 yo   BACK SURGERY     Age 70   CESAREAN SECTION     ECTOPIC PREGNANCY SURGERY     LAPAROSCOPIC HYSTERECTOMY     LEFT HEART CATH AND CORONARY ANGIOGRAPHY N/A 01/09/2022   Procedure: LEFT HEART CATH AND CORONARY ANGIOGRAPHY;  Surgeon: Wonda Sharper, MD;  Location: [facility] INVASIVE CV LAB;  Service: CV:: Widely patent coronaries with mild nonobstructive LAD plaquing.  Right dominant system.  Normal LVEDP.  Based on clinical  presentation, findings are consistent with acute Takotsubo Cardiomyopathy Syndrome   POSTERIOR FUSION LUMBAR SPINE  05/24/2022   [facility], [place]; [name], MD): L2-L3 XLIF, L2-L3 POSTERIOR DECOMPRESSION AND FUSION   TRANSTHORACIC ECHOCARDIOGRAM  01/08/2022   Severely decreased LV function-EF 25 to 30%.  Mid to apical (mostly anterior) with normal basal motion.  GR 2 DD-elevated LAP.  Mildly dilated LA.  Aortic sclerosis with no stenosis.  No AI.  Normal MV with mild to moderate TR.  Mildly elevated RAP, and PAP (estimated 49 mg). If LAD CAD ruled out - consistent with Takotsubo CM Syndrome.   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) 12/30/2023   New onset atrial fibrillation (HCC) 12/30/2023   Myopia of both eyes with astigmatism and presbyopia 05/06/2023   Dry eye syndrome of both eyes 05/06/2023   Mild tricuspid regurgitation 05/06/2022   Mild mitral regurgitation 05/06/2022   Hypertension  05/06/2022   History of seizure 05/06/2022   Takotsubo cardiomyopathy-resolved 01/10/2022   Hyperlipidemia with target LDL less than 100 01/10/2022   Hypothyroid 01/10/2022   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) 01/08/2022   Numbness in both legs 04/10/2021   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   Posterior vitreous detachment of both eyes 02/05/2018   Ventricular premature beats 10/31/2015   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   Pure hypercholesterolemia 06/28/2014   Plantar fasciitis 06/28/2014    PCP: Windy Coy, MD   REFERRING PROVIDER: Vernetta Lonni GRADE,  MD  REFERRING DIAG:  [phone] (ICD-10-CM) - Pain in right hip  M54.50 (ICD-10-CM) - Low back pain, unspecified  Eval and treat low back/right pelvis and right hip/ishium Any modalities per therapist   THERAPY DIAG:  Radiculopathy, lumbar region  Pain in right hip  Muscle weakness (generalized)  Cramp and spasm  Other abnormalities of gait and mobility  Unsteadiness on feet  RATIONALE FOR EVALUATION AND TREATMENT: Rehabilitation  ONSET DATE: Memorial Day weekend  NEXT MD VISIT:  To be scheduled 8-10 days s/p lumbar MRI    SUBJECTIVE:                                                                                                                                                                                                         SUBJECTIVE STATEMENT: [name] Trujillo reports Dr. 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   EVAL: [name] Trujillo is well know to me from several prior PT episodes for foot drop, cervical and lumbar radiculopathy.  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  Woke up the next morning with severe R hip/buttock pain.  Pain mostly localized to sciatic and ischial area of her R buttock.  PCP provider told her it was likely hip bursitis but ortho MD feels like it is more likely radicular from her back.  Some relief from steroid injection around her right hip ischial area provided by ortho MD but pain has never fully gone away.   She does have a significant lurch in her gait which is somewhat more pronounced than on prior PT episodes with this PT.  PAIN: Are you having pain? Yes: NPRS scale: 2/10 at rest/sitting; 4/10 walking  Pain location: R lower buttock  Pain description: burning, numb pain  PERTINENT HISTORY:  L2-3 XLIF/PSF 05/24/22; C5-7 ACDF 09/21/20; idiopathic progressive neuropathy; hypthyroidism; DDD; remote  h/o  back surgery at age 19; h/o scoliosis; Takotsubo cardiomyopathy 01/10/22; CAD; paroxysmal afib; mitral and tricuspid regurgitation; myalgia due to statin; recent cataract surgery  PRECAUTIONS: Fall  RED FLAGS: Bowel or bladder incontinence: Yes: chronic bladder issues, bowel urgency - somewhat worse than before   WEIGHT BEARING RESTRICTIONS: No  FALLS:  Has patient fallen in last 6 months? No  LIVING ENVIRONMENT: Lives with: lives with their spouse Lives in: House/apartment Stairs: Yes: External: 5-6 steps; on left going up Has following equipment at home: Walker - 2 wheeled, shower chair, and hiking/walking poles  OCCUPATION: Retired runner, broadcasting/film/video  PLOF: Independent and Leisure: walking ~30 minutes 1x/day (3x around the court near where she lives); sewing/quilting; likes working in the yard    PATIENT GOALS: To start walking again w/o doing more damage.   OBJECTIVE: (objective measures completed at initial evaluation unless otherwise dated)  DIAGNOSTIC FINDINGS:  04/21/24 - XR RIGHT HIP: An AP pelvis and lateral of the right hip shows well-maintained joint space with no cortical irregularities around the hip or the trochanteric area of the hip.  The lower lumbar spine shows severe degenerative changes at L4-L5 and L5-S1 on the AP view.   08/27/22 - LUMBAR SPINE - COMPLETE 4+ VIEW FINDINGS: There is mild levoconvex curvature of the lumbar spine, unchanged. Alignment is anatomic. There are bilateral transpedicular screws in posterior fusion rods present at L2-L3, new from prior. Disc spacers present. Alignment is anatomic. No fractures are identified. There is moderate severe disc space narrowing at L4-L5 and L5-S1 as well as L1-L2 similar to the prior study. There are atherosclerotic calcifications of the aorta.   IMPRESSION: 1. Status post posterior fusion at L2-L3. Alignment is anatomic. 2. Multilevel degenerative disc disease, similar to prior study.  PATIENT SURVEYS:  Modified  Oswestry:  MODIFIED OSWESTRY DISABILITY SCALE Date:  05/11/2024 07/06/24  Pain intensity 2 =  Pain medication provides me with complete relief from pain. 2  2. Personal care (washing, dressing, etc.) 1 =  I can take care of myself normally, but it increases my pain. 2  3. Lifting 4 = I can lift only very light weights 4  4. Walking 1 = Pain prevents me from walking more than 1 mile. 3  5. Sitting 1 =  I can only sit in my favorite chair as long as I like. 1  6. Standing 2 =  Pain prevents me from standing more than 1 hour 3  7. Sleeping 2 =  Even when I take pain medication, I sleep less than 6 hours 1  8. Social Life 2 = Pain prevents me from participating in more energetic activities (eg. sports, dancing). 3  9. Traveling 2 =  My pain restricts my travel over 2 hours. 3  10. Employment/ Homemaking 2 = I can perform most of my homemaking/job duties, but pain prevents me from performing more physically stressful activities (eg, lifting, vacuuming). 2  Total 17 / 50 = 34.0 % 24 / 50 = 48.0 %  Interpretation Moderate disability Severe disability   Interpretation of scores: Score Category Description  0-20% Minimal Disability The patient can cope with most living activities. Usually no treatment is indicated apart from advice on lifting, sitting and exercise  21-40% Moderate Disability The patient experiences more pain and difficulty with sitting, lifting and standing. Travel and social life are more difficult and they may be disabled from work. Personal care, sexual activity and sleeping are not grossly affected, and the patient can usually  be managed by conservative means  41-60% Severe Disability Pain remains the main problem in this group, but activities of daily living are affected. These patients require a detailed investigation  61-80% Crippled Back pain impinges on all aspects of the patient's life. Positive intervention is required  81-100% Bed-bound These patients are either bed-bound or  exaggerating their symptoms  Bluford FORBES Zoe DELENA Karon DELENA, et al. Surgery versus conservative management of stable thoracolumbar fracture: the PRESTO feasibility RCT. [place] ([place]): [employer]; 2021 Nov. [facility] Assessment, No. 25.62.) Appendix 3, Oswestry Disability Index category descriptors. Available from: Findjewel[employer]cz Minimally Clinically Important Difference (MCID) = 12.8%  LEFS  Extreme difficulty/unable (0), Quite a bit of difficulty (1), Moderate difficulty (2), Little difficulty (3), No difficulty (4) Survey date:  05/11/24 07/06/24   Any of your usual work, housework or school activities 1 2  2. Usual hobbies, recreational or sporting activities 2 2  3. Getting into/out of the bath 2 2  4. Walking between rooms 2 4  5. Putting on socks/shoes 2 4  6. Squatting  0 2  7. Lifting an object, like a bag of groceries from the floor 2 3  8. Performing light activities around your home 2 2  9. Performing heavy activities around your home 0 1  10. Getting into/out of a car 2 1  11. Walking 2 blocks 0 1  12. Walking 1 mile 2 0  13. Going up/down 10 stairs (1 flight) 2 3  14. Standing for 1 hour 2 1  15.  sitting for 1 hour 3 4  16. Running on even ground 0 0  17. Running on uneven ground 0 0  18. Making sharp turns while running fast 0 0  19. Hopping  0 0  20. Rolling over in bed 2 3  Score total:   26 / 80 = 32.5 % 35 / 80 = 43.8 %  Functional limitation: Moderate Moderate     SCREENING FOR RED FLAGS: Bowel or bladder incontinence: Yes: chronic bladder issues, bowel urgency - somewhat worse than before Spinal tumors: No Cauda equina syndrome: No Compression fracture: No Abdominal aneurysm: No  COGNITION:  Overall cognitive status: Within functional limits for tasks assessed    SENSATION: WFL Except B distal LE neuropathy  POSTURE:  rounded shoulders, forward head, right pelvic obliquity, and  scoliosis  PALPATION: Increased muscle tension in L>R lumbar paraspinals and R>L glutes and piriformis with TTP over inferior glutes and piriformis near hamstring origin  LUMBAR ROM: *Lumbar ROM assessed with UE support for balance and close SBA/CGA of PT  Active  Eval  Flexion Hands to distal shins  Extension 50% limited  Right lateral flexion Hand to upper thigh - p!  Left lateral flexion Hand to lateral knee  Right rotation 50% limited  Left rotation 30% limited - p!  (Blank rows = not tested)  MUSCLE LENGTH: Hamstrings: mild/mod tight B ITB: mild tight B Piriformis: mod/severe tight B Hip IR: mod tight B Hip flexors: mild tight R Quads: mild tight R>L Heelcord: mild/mod tight B  LOWER EXTREMITY ROM:    Grossly WFL other than limitations indicated above due to muscle tightness   LOWER EXTREMITY MMT:    MMT Right eval Left eval R 07/06/24 L 07/06/24  Hip flexion 3+ 4- 4- 4  Hip extension 4- 4 4 4   Hip abduction 3+ 4- 4 4  Hip adduction 3+ 4- 4- 4  Hip internal rotation 4- 4- 4+ 4  Hip external rotation 3+ 3+ 4 4-  Knee flexion 4 4 4+ 4+  Knee extension 4 4 4+ 4+  Ankle dorsiflexion 3- 3- 3+ 3-  Ankle plantarflexion      Ankle inversion      Ankle eversion       (Blank rows = not tested)  FUNCTIONAL TESTS: (Assessment completed 05/14/24 unless otherwise indicated) 5 times sit to stand: 12.37 sec w/o UE assist - 06/01/24 Timed up and go (TUG): 13.75 sec with single hiking pole 10 meter walk test: 16.00 sec with single hiking pole Gait speed: 2.05 ft/sec with single hiking pole Berg Balance Scale: 42/56; 37-45 significant (>80%) - 05/19/24 Functional gait assessment: 10/30, < 19 = high risk fall    Standardized testing results as of discharge from latest PT episode (11/11/23): 5xSTS = 11.84 sec  = 13.63 sec w/o AD Gait speed = 2.41 ft/sec w/o AD Berg = 45/56; scores of 37-45 indicate significant (>80%) fall risk  FGA = 19/30; scores of 19-24 indicate a medium  risk for falls   GAIT: Distance walked: Clinic distances Assistive device utilized: Single walking/trekking pole on R Level of assistance: SBA Gait pattern: Increased sway, step through pattern, decreased stride length, trendelenburg, lateral hip instability, poor foot clearance- Right, and poor foot clearance- Left Comments: B foot slap   TODAY'S TREATMENT:    07/29/2024  THERAPEUTIC EXERCISE: To improve strength, endurance, and flexibility.   Rec Bike - L4 x 6'  SELF CARE:   Answered patient's questions and provided education regarding what MRI can determine, explaining difference between spinal canal stenosis and foraminal stenosis which are both present on her past MRIs and how her current symptoms correlate.  NEUROMUSCULAR RE-EDUCATION: To improve balance, coordination, kinesthesia, posture, proprioception, and reduce fall risk. Corner balance progression with narrow BOS on firm surface with arms crossed on chest            Eyes open: static stance 3 x 30 sec horiz head turns x 5 vertical head nods x 5 Pt noted to center majority of weight on L LE   THERAPEUTIC ACTIVITIES: To improve functional performance.  Demonstration, verbal and tactile cues throughout for technique. Standing R hip extension diagonal with L foot on 2 block x 10 - mild to no increased R hip pain Standing L hip extension diagonal with R foot on 2 block x 10 - mod increased R hip pain with more limited ROM and increased difficulty maintaining level pelvis Standing L hip hike with R foot on 2 block x 5 - limited by increased pain   07/27/2024  THERAPEUTIC EXERCISE: To improve strength and endurance.   Rec Bike - L4 x 4', L3 x 2'  PHYSICAL PERFORMANCE TEST or MEASUREMENT: Berg Balance Test  Sit to Stand Able to stand without using hands and stabilize independently   Standing Unsupported Able to stand safely 2 minutes   Sitting with Back Unsupported but Feet Supported on Floor or Stool Able to sit  safely and securely 2 minutes   Stand to Sit Sits safely with minimal use of hands   Transfers Able to transfer safely, minor use of hands   Standing Unsupported with Eyes Closed Able to stand 3 seconds   Standing Unsupported with Feet Together Able to place feet together independently but unable to hold for 30 seconds   From Standing, Reach Forward with Outstretched Arm Can reach forward >12 cm safely (5)   From Standing Position, Pick up Object from Floor  Able to pick up shoe safely and easily   From Standing Position, Turn to Look Behind Over each Shoulder Turn sideways only but maintains balance   Turn 360 Degrees Able to turn 360 degrees safely one side only in 4 seconds or less   Standing Unsupported, Alternately Place Feet on Step/Stool Able to stand independently and complete 8 steps >20 seconds   Standing Unsupported, One Foot in [employer] balance while stepping or standing   Standing on One Leg Tries to lift leg/unable to hold 3 seconds but remains standing independently   Total Score 40   Berg comment: 37-45 = Significant (>80%) fall risk       THERAPEUTIC ACTIVITIES: To improve functional performance.  Demonstration, verbal and tactile cues throughout for technique.  = 13.56 sec with single trekking pole, 13.10 sec w/o AD Gait speed = 2.42 ft/sec with single trekking pole, 2.50 ft/sec w/o AD Goal assessment   07/15/2024 THERAPEUTIC EXERCISE: To improve strength and endurance.    Rec Bike - L4 x 6 min  THERAPEUTIC ACTIVITIES: To improve functional performance.  Demonstration, verbal and tactile cues throughout for technique. Standing R hip hike with L foot on 2 block x 10 Standing L hip hike with R foot on 2 block x 5 - limited by increased pain Standing R hip extension diagonal with L foot on 2 block x 10 - mild increased R hip pain Standing L hip extension diagonal with R foot on 2 block x 10 - mod increased R hip pain with more limited ROM Standing R hip  extension diagonal with L foot on 2 block and torso leaning on pillow + white wedge on elevated mat table x 10 - less pain noted Standing L hip extension diagonal with R foot on 2 block and torso leaning on pillow + white wedge on elevated mat table x 10 - less pain noted  Eccentric squat touch to elevated mat table with UE support on back of chair x 10 Lateral step & lift over cone x 10 bil (simulating stepping over edge of tub) - mild increased pain in R buttock with L step-over   07/13/2024  THERAPEUTIC EXERCISE: To improve strength, endurance, ROM, and flexibility.  Demonstration, verbal and tactile cues throughout for technique.  Rec Bike - L4 x 4 min, L3 x 2 min Manual R hip flexor stretch x 30 Manual R TFL stretch x 30 Manual R ITB stretch x 30 S/L RTB clam x 10 bil Bridge + RTB hip ABD isometric - deferred due to increased R buttock and LE radicular pain Bridge + manual PT resisted hip ABD isometric - deferred due to increased R buttock and LE radicular pain Bridge + hip ADD ball squeeze isometric - deferred due to increased R buttock and LE radicular pain  MANUAL THERAPY: To promote movement utilizing connective tissue massage, therapeutic massage, manual TP therapy, and myofascial release. STM/DTM, manual TPR to R distal glutes, piriformis, TFL, and ITB  THERAPEUTIC ACTIVITIES: To improve functional performance.  Demonstration, verbal and tactile cues throughout for technique. Standing hip extension diagonal with looped RTB at distal thighs x 8-10 bil (limited due to fatigue) - added to HEP without RTB Standing L/R hip hike with R foot on 2 block 2 x 10 Standing L/R hip circumduction with opp foot standing on 2 block - focusing on maintaining level pelvis x ~10 each side   07/08/2024  THERAPEUTIC EXERCISE: To improve strength, endurance, ROM, and flexibility.  Demonstration, verbal and  tactile cues throughout for technique.  Rec Bike - L4 x 3 min, L3 x 3 min - resistance  level reduced due to increasing fatigue Supine manual stretches: R hamstring stretch x 30 R mod Thomas quad/hip flexor stretch x 30 R glute cross body stretch x 30 R ITB cross body stretch x 30 R KTOS piriformis stretch x 30 R figure-4 to chest piriformis stretch x 30  NEUROMUSCULAR RE-EDUCATION: To improve coordination, kinesthesia, and proprioception.  Hooklying R sciatic nerve glide x 10 - patient reporting feeling of less motion at toes during nerve glide Supine/long sitting R isometric toe curls into therapist's hand x 10 Attempted long sitting R toe curls into YTB however patient unable to create AROM against resistance band  MANUAL THERAPY: To promote normalized muscle tension, improved flexibility, and reduced pain utilizing connective tissue massage, therapeutic massage, manual TP therapy, and myofascial release.  STM/DTM, manual TPR, and percussive massage with massage gun to R distal glutes, piriformis, TFL, distal iliopsoas, vastus lateralis and ITB  MODALITIES: (non-billable) Moist heat pack to R glutes x 10' at end of session   07/06/2024 - Recert THERAPEUTIC EXERCISE: To improve strength and endurance.   Rec Bike - L4 x 3 min, L3 x 3 min - resistance level reduced due to increasing fatigue  THERAPEUTIC ACTIVITIES: To improve functional performance.  Demonstration, verbal and tactile cues throughout for technique.  LE MMT - see above LE MMT table LEFS: 35/80 = 43.8% indicating moderate functional limitation Modified Oswestry: 24/50 = 48% or severe disability Gait speed = 2.08 ft/sec with single hiking pole Goal assessment  PHYSICAL PERFORMANCE TEST or MEASUREMENT: Functional Gait  Assessment  Gait Level Surface Walks 20 ft, slow speed, abnormal gait pattern, evidence for imbalance or deviates 10-15 in outside of the 12 in walkway width. Requires more than 7 sec to ambulate 20 ft.   Change in Gait Speed Makes only minor adjustments to walking speed, or  accomplishes a change in speed with significant gait deviations, deviates 10-15 in outside the 12 in walkway width, or changes speed but loses balance but is able to recover and continue walking.   Gait with Horizontal Head Turns Performs head turns with moderate changes in gait velocity, slows down, deviates 10-15 in outside 12 in walkway width but recovers, can continue to walk.   Gait with Vertical Head Turns Performs task with slight change in gait velocity (eg, minor disruption to smooth gait path), deviates 6 - 10 in outside 12 in walkway width or uses assistive device   Gait and Pivot Turn Pivot turns safely in greater than 3 sec and stops with no loss of balance, or pivot turns safely within 3 sec and stops with mild imbalance, requires small steps to catch balance.   Step Over Obstacle Is able to step over one shoe box (4.5 in total height) without changing gait speed. No evidence of imbalance.   Gait with Narrow Base of Support Ambulates less than 4 steps heel to toe or cannot perform without assistance.   Gait with Eyes Closed Cannot walk 20 ft without assistance, severe gait deviations or imbalance, deviates greater than 15 in outside 12 in walkway width or will not attempt task.   Ambulating Backwards Walks 20 ft, slow speed, abnormal gait pattern, evidence for imbalance, deviates 10-15 in outside 12 in walkway width.   Steps Alternating feet, must use rail.   Total Score 12   FGA comment: < 19 = high risk fall  07/01/2024  THERAPEUTIC EXERCISE: To improve strength, endurance, ROM, and flexibility.  Demonstration, verbal and tactile cues throughout for technique.  Rec Bike - L3 x 6 min Seated 3-way thoracolumbar flexion stretch 2 x 30 each position, using hiking pole for UE support Supine R sciatic nerve glides 2 x 10 bil Hooklying R figure-4 piriformis stretch x 30 Hooklying R KTOS piriformis stretch x 30 - pt noting increased groin anterior thigh discomfort Mod thomas R  quad/hip flexor stretch 2 x 30 - 1 rep each w/o and with strap Supine R HS stretch with strap x 30 Supine R ITB stretch with strap x 30   MANUAL THERAPY: To promote normalized muscle tension, improved flexibility, improved joint mobility, and reduced pain utilizing connective tissue massage, therapeutic massage, manual TP therapy, and myofascial release.  STM/DTM and manual TPR to R lower glutes and piriformis   SELF CARE:  Provided education on self-STM/muscle release options and need to break up her travel to [place] with frequent stops to allow her to stretch and walk around.   MODALITIES: (non-billable) Moist heat pack to R glutes x 10' at end of session   06/24/2024 THERAPEUTIC EXERCISE: To improve strength, endurance, ROM, and flexibility.  Demonstration, verbal and tactile cues throughout for technique.  Rec Bike - L3 x 6 min Seated 3-way thoracolumbar flexion stretch 2 x 30 each position, using hiking pole for UE support, clarifying angle for lateral stretches Seated B foot/ankle arch raises x 10 Seated toe curls/towel scrunches - patient with difficulty achieving grip of towel with toes therefore suggested patient try using a small object like a pen or pencil to work on grip with toes at home  NEUROMUSCULAR RE-EDUCATION: To improve coordination, kinesthesia, and reduce nerve irritation. Seated sciatic nerve glides - difficulty coordinating movement  Supine sciatic nerve glides 2 x 10 bil Supine sciatic nerve glides with 90/90 knee flexion x 10 bil - patient reporting better response to previous exercise  THERAPEUTIC ACTIVITIES: To improve functional performance.  Demonstration, verbal and tactile cues throughout for technique. Unsupported mini-squat hovering over mat table with arms forward for balance (chair placed in front for safety with intermittent support) x 10 - attempted in bare feet today as patient feeling like she is losing proprioceptive awareness and control  in her feet, however still demonstrating frequent loss of eccentric control/uncontrolled descent onto mat table   06/22/2024 THERAPEUTIC EXERCISE: To improve strength, endurance, and flexibility.  Demonstration, verbal and tactile cues throughout for technique.  Rec Bike - L3 x 6 min Seated 3-way thoracolumbar flexion stretch 2 x 30 each position using hiking pole for UE support  THERAPEUTIC ACTIVITIES: To improve functional performance.  Demonstration, verbal and tactile cues throughout for technique. Unsupported mini-squat hovering over mat table with arms forward for balance (chair placed in front for safety with intermittent support) x 10 - more frequent loss of eccentric control/uncontrolled descent onto mat table Unsupported mini-squat with glute touch touch to rounded bolster on mat table with arms forward for balance (chair placed in front for safety with intermittent support) x 10  NEUROMUSCULAR RE-EDUCATION: To improve balance, coordination, kinesthesia, posture, proprioception, and amplitude of movement.  S/L R hip ABD/ext diagonals 2 x 5 - L knee flexed for increased ant/post stability to prevent pt from rolling backward & PT guiding motion for 1st few reps; repeated x 10 with L LE  S/L R hip circumduction arc over L LE tapping R foot in front and behind L foot x 5 -  very limited eccentric control with posterior tap; repeated x 10 with L LE   06/08/2024 THERAPEUTIC EXERCISE: To improve strength, endurance, ROM, and flexibility.  Demonstration, verbal and tactile cues throughout for technique. Rec Bike - L3 x 6 min Seated 3-way thoracolumbar flexion stretch 2 x 30 each position - 1st reps with hands on green Pball, 2nd reps using hiking pole for UE support Quadruped 3-way child's pose x 30 each  THERAPEUTIC ACTIVITIES: To improve functional performance.  Demonstration, verbal and tactile cues throughout for technique. Reviewed proper lifting mechanics using squatting motion and  golfer's lift  Unsupported mini-squat hovering over mat table with arms forward for balance (chair placed in front for safety but not needed for support) x 8  SELF CARE:  Provided education on managing muscle cramping through stretching, increased hydration, and potential Mg supplement (instructed pt to verify with PCP that there are no concerns or potential medicine interactions with trying Mg supplement).   06/03/2024  THERAPEUTIC EXERCISE: To improve strength, endurance, and flexibility.  Demonstration, verbal and tactile cues throughout for technique.  Rec Bike - L3 x 6 min Standing lateral lean R ITB over back of chair 2 x 30 Supine ITB stretch with strap 3 x 30 bil  MANUAL THERAPY: To promote normalized muscle tension, improved flexibility, and reduced pain utilizing connective tissue massage, therapeutic massage, manual TP therapy, and myofascial release.  STM/DTM, IASTM with foam roller and manual TPR to R distal glutes, HS and ITB  NEUROMUSCULAR RE-EDUCATION: To improve coordination, kinesthesia, posture, proprioception, and amplitude of movement.  S/L R hip ABD/ext diagonals x 10 - slight PT assist initially to stabilize hips to prevent anterior/posterior rotation & VC/TC to avoid hip ER S/L R hip circumduction arc over L LE tapping R foot in front and behind L foot x 5 - very limited eccentric control with posterior tap   SELF CARE:  HEP update Provided instruction in self-STM techniques to R ITB using rolling pin.    06/01/2024 THERAPEUTIC EXERCISE: To improve strength and endurance.  Demonstration, verbal and tactile cues throughout for technique. NuStep - L4 x 6 min (LE only)  THERAPEUTIC ACTIVITIES: To improve functional performance.  Demonstration, verbal and tactile cues throughout for technique. 5xSTS = 12.37 sec w/o UE assist, somewhat uncontrolled descent esp on last rep  TRIGGER POINT DRY NEEDLING: Treatment instructions/education: Initial Treatment: Pt  instructed on Dry Needling rational, procedures, and possible side effects. Pt instructed to expect mild to moderate muscle soreness later in the day and/or into the next day.  Pt instructed in methods to reduce muscle soreness. Pt instructed to continue prescribed HEP. Patient was educated on signs and symptoms of infection and other risk factors and advised to seek medical attention should they occur.  Patient verbalized understanding of these instructions and education.  Education Handout Provided: Yes Consent: Patient Verbal Consent Given: Yes Treatment: Muscles Treated: R medial and lateral piriformis, R glute medius Utilized skilled palpation to identify bony landmarks and trigger points along with monitoring of soft tissue during DN. Electrical Stimulation Performed: No Treatment Response/Outcome: Twitch response elicited, Palpable increase in muscle length, Decreased tissue resistance noted, Decreased pain/TTP, and Improved exercise tolerance  MANUAL THERAPY: To promote normalized muscle tension, improved flexibility, and reduced pain. STM/DTM, manual TPR and pin & stretch to muscles addressed with DN   NEUROMUSCULAR RE-EDUCATION: To improve balance, coordination, kinesthesia, posture, proprioception, and amplitude of movement. Mini-squat with counter support x 10   05/19/24 THERAPEUTIC EXERCISE: To improve ROM and  flexibility.  Demonstration, verbal and tactile cues throughout for technique.  Bike L3x54min Standing runner stretch x 30 BLE Standing soleus stretch x 30 BLE Table piriformis stretch 2x30 RLE Supine L figure 4 stretch 2x30 LLE Supine glute stretch 2x30 BLE Supine KTOS stretch 2x30 BLE  Seated clams with GTB + trA x 10 BLE Seated marching GTB x + TrA 10 BLE Berg Balance Test  Sit to Stand Able to stand without using hands and stabilize independently   Standing Unsupported Able to stand 2 minutes with supervision   Sitting with Back Unsupported but Feet  Supported on Floor or Stool Able to sit safely and securely 2 minutes   Stand to Sit Sits safely with minimal use of hands   Transfers Able to transfer safely, minor use of hands   Standing Unsupported with Eyes Closed Able to stand 10 seconds with supervision   Standing Unsupported with Feet Together Able to place feet together independently and stand 1 minute safely   From Standing, Reach Forward with Outstretched Arm Can reach confidently >25 cm (10)   From Standing Position, Pick up Object from Floor Able to pick up shoe safely and easily   From Standing Position, Turn to Look Behind Over each Shoulder Looks behind one side only/other side shows less weight shift   Turn 360 Degrees Able to turn 360 degrees safely but slowly   Standing Unsupported, Alternately Place Feet on Step/Stool Able to complete 4 steps without aid or supervision   Standing Unsupported, One Foot in Front Needs help to step but can hold 15 seconds   Standing on One Leg Unable to try or needs assist to prevent fall   Total Score 42       05/14/2024  THERAPEUTIC ACTIVITIES: To improve functional performance.  Demonstration, verbal and tactile cues throughout for technique.  5xSTS: Deferred due to increased pain with uncontrolled descent upon sitting TUG: 13.75 sec with single hiking pole : 16.00 sec with single hiking pole Gait speed: 2.05 ft/sec with single hiking pole  PHYSICAL PERFORMANCE TEST or MEASUREMENT: Functional Gait  Assessment  Gait Level Surface Walks 20 ft, slow speed, abnormal gait pattern, evidence for imbalance or deviates 10-15 in outside of the 12 in walkway width. Requires more than 7 sec to ambulate 20 ft.   Change in Gait Speed Makes only minor adjustments to walking speed, or accomplishes a change in speed with significant gait deviations, deviates 10-15 in outside the 12 in walkway width, or changes speed but loses balance but is able to recover and continue walking.   Gait with  Horizontal Head Turns Performs head turns with moderate changes in gait velocity, slows down, deviates 10-15 in outside 12 in walkway width but recovers, can continue to walk.   Gait with Vertical Head Turns Performs task with moderate change in gait velocity, slows down, deviates 10-15 in outside 12 in walkway width but recovers, can continue to walk.   Gait and Pivot Turn Turns slowly, requires verbal cueing, or requires several small steps to catch balance following turn and stop   Step Over Obstacle Is able to step over one shoe box (4.5 in total height) without changing gait speed. No evidence of imbalance.   Gait with Narrow Base of Support Ambulates less than 4 steps heel to toe or cannot perform without assistance.   Gait with Eyes Closed Cannot walk 20 ft without assistance, severe gait deviations or imbalance, deviates greater than 15 in outside 12 in walkway width  or will not attempt task.   Ambulating Backwards Walks 20 ft, slow speed, abnormal gait pattern, evidence for imbalance, deviates 10-15 in outside 12 in walkway width.   Steps Alternating feet, must use rail.   Total Score 10   FGA comment: < 19 = high risk fall      THERAPEUTIC EXERCISE: To improve ROM and flexibility.  Demonstration, verbal and tactile cues throughout for technique.  Hooklying R HS stretch with strap hamstring 2 x 30 Supine gluteus stretch 2 x 30 bil Hooklying figure-4 piriformis stretch x 30 on L, deferred d/t increased pain on R Hooklying KTOS piriformis stretch x 30 on L, deferred d/t increased pain on R Side-sitting R modified pigeon piriformis stretch 2 x 30 - good stretch noted without increased pain Seated R KTOS piriformis stretch 2 x 30 - good stretch noted without increased pain  SELF CARE:   Modified HEP to address concerns regarding increased R buttock/hip pain with piriformis stretching, with patient reporting better tolerance for seated piriformis stretching.  Patient cautioned not to  force any painful ROM/stretching and report back to PT next visit if HEP continues to irritate or cause increased pain. Provided instruction in self-STM techniques to R glutes/piriformis using tennis ball against wall or in chair.    05/04/2024  SELF CARE:  Reviewed eval findings and role of PT in addressing identified deficits as well as instruction in initial HEP (see below).    PATIENT EDUCATION:  Education details: continue with current HEP  Person educated: Patient Education method: Explanation Education comprehension: verbalized understanding  HOME EXERCISE PROGRAM: Access Code: [facility] URL: [email or web address] Date: 07/13/2024 Prepared by: Elijah Hidden  Exercises - Standing Soleus Stretch  - 1 x daily - 7 x weekly - 3 sets - 3 reps - 30 sec hold - Hooklying Hamstring Stretch with Strap  - 2 x daily - 7 x weekly - 3 reps - 30 sec hold - Supine Gluteus Stretch  - 2 x daily - 7 x weekly - 3 reps - 30 sec hold - Supine Figure 4 Piriformis Stretch  - 2 x daily - 7 x weekly - 3 reps - 30 sec hold - Supine Piriformis Stretch with Foot on Ground  - 2 x daily - 7 x weekly - 3 reps - 30 sec hold - Seated Table Piriformis Stretch  - 2 x daily - 7 x weekly - 3 reps - 30 sec hold - Seated Piriformis Stretch  - 2 x daily - 7 x weekly - 3 reps - 30 sec hold - Seated March with Resistance  - 1 x daily - 7 x weekly - 2 sets - 10 reps - Seated Isometric Hip Abduction with Resistance  - 1 x daily - 7 x weekly - 2 sets - 10 reps - Glute Max Release With [employer] Against Wall  - 1 x daily - 7 x weekly - 1-2 min hold - Mini Squat with Counter Support  - 1 x daily - 3 x weekly - 2 sets - 10 reps - 3-5 sec hold - Standing ITB Stretch (Mirrored)  - 2 x daily - 7 x weekly - 3 reps - 30 sec hold - Supine Iliotibial Band Stretch with Strap  - 2 x daily - 7 x weekly - 3 reps - 30 sec hold - Sidelying Diagonal Hip Abduction  - 1 x daily - 3 x weekly - 2 sets - 10 reps - 3 sec hold -  Sidelying Hip Abduction  and Extension (Mirrored)  - 1 x daily - 3 x weekly - 2 sets - 5 reps - 3 sec hold - Seated 3 Way Exercise Ball Roll Out Stretch  - 1 x daily - 7 x weekly - 2 sets - 3 reps - 30 sec hold - Mini Squat with Chair  - 1 x daily - 3 x weekly - 2 sets - 10 reps - 3 sec hold - Supine Sciatic Nerve Glide  - 1 x daily - 7 x weekly - 2 sets - 10 reps - 3 sec hold - Hip Hiking on Step  - 1 x daily - 3 x weekly - 2 sets - 10 reps - 3 sec hold - Diagonal Hip Extension  - 1 x daily - 3 x weekly - 2 sets - 10 reps - 3 sec hold  Patient Education - Lifting Techniques   ASSESSMENT:  CLINICAL IMPRESSION: [name] Trujillo reports Dr. 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  She had several questions related to the MRI and what it might be able to determine, therefore provided education on what a MRI reveals in relation to nerve impingement as result of either spinal canal stenosis and/or foraminal stenosis, which are both present on her past MRIs, and how her current symptoms correlate.  She requested to review the corner balance progression as she has forgotten what she was to be doing at home and feels like her balance is becoming more challenging for her.  During the corner balance activities, it was noted that she shifts the majority of her weight centered over her L LE in order to maintain her balance, therefore continued to work on glute medius strengthening to promote better pelvic symmetry in stance but somewhat limited by pain today.  [name] Trujillo will benefit from continued skilled PT to address ongoing flexibility, strength and balance deficits deficits to improve mobility and activity tolerance with decreased pain interference and decreased risk for falls.    EVAL: [name] Trujillo is a 83 y.o. female who was referred to physical therapy for evaluation and treatment for R hip/buttock pain presumed to be the result of lumbar  radiculopathy.  Patient reports onset of R buttock pain beginning Memorial Day weekend 2025.  Pain is worse with walking and positional changes.  Patient has deficits in hip ROM, proximal LE flexibility, B LE strength, abnormal posture, and TTP with abnormal muscle tension in R lower glutes and piriformis which are interfering with ADLs and are impacting quality of life.  On Modified Oswestry patient scored 17/50 demonstrating 34% or moderate disability.  On LEFS patient scored 26/80 demonstrating moderate functional limitation.  Given her significant LE weakness and impaired gait pattern, will plan more formalized balance testing next visit.  Arnett will benefit from skilled PT to address above deficits to improve mobility and activity tolerance with decreased pain interference.     OBJECTIVE IMPAIRMENTS: Abnormal gait, decreased activity tolerance, decreased balance, decreased knowledge of condition, decreased mobility, difficulty walking, decreased ROM, decreased strength, increased fascial restrictions, impaired perceived functional ability, increased muscle spasms, impaired flexibility, impaired sensation, improper body mechanics, postural dysfunction, and pain.   ACTIVITY LIMITATIONS: carrying, lifting, bending, standing, squatting, sleeping, stairs, transfers, bed mobility, bathing, toileting, dressing, reach over head, hygiene/grooming, locomotion level, and caring for others  PARTICIPATION LIMITATIONS: meal prep, cleaning, laundry, driving, shopping, community activity, yard work, and church  PERSONAL FACTORS: Age, Past/current experiences, Time since onset of injury/illness/exacerbation,  and 3+ comorbidities: L2-3 XLIF/PSF 05/24/22; C5-7 ACDF 09/21/20; idiopathic progressive neuropathy; hypthyroidism; DDD; remote h/o back surgery at age 9; h/o scoliosis; 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   REHAB POTENTIAL: Good  CLINICAL DECISION MAKING: Unstable/unpredictable  EVALUATION COMPLEXITY: High   GOALS: Goals reviewed with patient? Yes  SHORT TERM GOALS: Target date: 06/15/2024  Patient will be independent with initial HEP to improve outcomes and carryover.  Baseline: Initial HEP provided on eval Goal status: MET - 05/19/24  2.  Patient will report 25% improvement in low back pain to improve QOL. Baseline: 2/10 on eval, up to 10/10 06/03/24 - improvement in pain noted by end of today's session Goal status: MET - 06/08/24 - Pt reports 50+% improvement in pain thus far  3.  Complete standardized balance assessment and update LTG's as indicated. Baseline:  05/14/24 - , TUG and FGA completed; Berg and 5xSTS pending  05/19/24 GLENWOOD Levins completed Goal status: MET - 06/01/24 - 5xSTS completed  LONG TERM GOALS: Target date: 07/20/2024, extended to 08/31/2024  Patient will be independent with ongoing/advanced HEP for self-management at home.  Baseline:  06/22/24 - HEP reviewed today Goal status: IN PROGRESS - 07/06/24 - no concerns with current HEP  2.  Patient will report 50-75% improvement in R hip/pelvic pain to improve QOL.  Baseline: 2/10 on eval, up to 10/10 06/08/24 - Pt reports 50+% improvement in pain thus far 07/06/24 - 50-60% improvement overall, but still having periods of more intense pain Goal status: IN PROGRESS - 07/27/24 - still at least 50% improvement overall, but pain still fluctuating   3.  Patient will demonstrate improved B proximal LE strength to >/= 4 to 4+/5 for improved stability and ease of mobility. Baseline: Refer to above LE MMT table Goal status: IN PROGRESS - 07/06/24   4.  Patient will report >/= 35/80 on LEFS (MCID = 9 pts) to demonstrate improved functional ability. Baseline: 26 / 80 = 32.5 % Goal status: MET - 07/06/24 - 35 / 80 = 43.8 %  5. Patient will report </= 22% on Modified Oswestry (MCID  = 12%) to demonstrate improved functional ability with decreased pain interference. Baseline: 17 / 50 = 34.0 % Goal status: IN PROGRESS - 07/06/24 - 24 / 50 = 48.0 %  6.  Patient will resume her normal walking routine w/o increased pain to allow for  improved mobility and activity tolerance. Baseline: R ischial pain limiting walking tolerance 07/06/24 - attempted to try walking in her neighborhood last week but experienced increased pain while trying to walk up a incline that caused her to have to stop and return home  Goal status: IN PROGRESS - 07/27/24 - patient reports she has been walking ~1/4 of her normal distance staying on level ground and not walking nearly as often as normal (was usually walking daily)  7.  Patient will improve Berg score by at least 8 points or to >/= 45/56 (baseline as of prior D/C from PT) to improve safety and stability with ADLs in standing and reduce risk for falls.  Baseline: 42/56 (05/19/24) Goal status: IN PROGRESS - 07/27/24 - individual item scores fluctuating but overall score decreased by 2 points to 40/56  8.  Patient will improve FGA score by at least 4 points or to >/= 19/30 (baseline as of prior D/C from PT) to improve gait stability and reduce risk for  falls.   Baseline: 10/30 (05/14/24) Goal status: IN PROGRESS - 07/06/24 - 12/30  9.  Patient will improve gait velocity to at least 2.62 ft/sec for improved gait efficiency and safety with community ambulation.  Baseline: 2.05 ft/sec with single hiking pole (05/14/24) 07/06/24 - 2.08 ft/sec with single hiking pole Goal status: IN PROGRESS - 07/27/24 - 07/27/24 - 2.42 ft/sec with single trekking pole, 2.50 ft/sec w/o AD   PLAN:  PT FREQUENCY: 2x/week  PT DURATION: 8 weeks  PLANNED INTERVENTIONS: 97164- PT Re-evaluation, 97750- Physical Performance Testing, 97110-Therapeutic exercises, 97530- Therapeutic activity, 97112- Neuromuscular re-education, 97535- Self Care, [id number]- Manual therapy, [id number]- Gait  training, [phone]- Electrical stimulation (unattended), 97035- Ultrasound, 02966- Ionotophoresis 4mg /ml Dexamethasone, 79439 (1-2 muscles), 20561 (3+ muscles)- Dry Needling, Patient/Family education, Balance training, Stair training, Taping, Joint mobilization, DME instructions, Cryotherapy, and Moist heat  PLAN FOR NEXT SESSION: stretch progression as indicated; progress gentle lumbopelvic/proximal LE strengthening as pain allows; MT +/- TPDN to address abnormal muscle tension in glutes and piriformis; potential trial of TENS unit if pain persisting; balance training to decrease fall risk   Elijah CHRISTELLA Hidden, PT 07/29/2024, 4:41 PM

## 2024-08-02 ENCOUNTER — Ambulatory Visit: Admitting: Physical Therapy

## 2024-08-10 ENCOUNTER — Encounter: Payer: Self-pay | Admitting: Physical Therapy

## 2024-08-10 ENCOUNTER — Ambulatory Visit: Attending: Orthopaedic Surgery | Admitting: Physical Therapy

## 2024-08-10 DIAGNOSIS — R2681 Unsteadiness on feet: Secondary | ICD-10-CM | POA: Insufficient documentation

## 2024-08-10 DIAGNOSIS — M6281 Muscle weakness (generalized): Secondary | ICD-10-CM | POA: Insufficient documentation

## 2024-08-10 DIAGNOSIS — M25551 Pain in right hip: Secondary | ICD-10-CM | POA: Insufficient documentation

## 2024-08-10 DIAGNOSIS — M5416 Radiculopathy, lumbar region: Secondary | ICD-10-CM | POA: Diagnosis present

## 2024-08-10 DIAGNOSIS — R2689 Other abnormalities of gait and mobility: Secondary | ICD-10-CM | POA: Diagnosis present

## 2024-08-10 DIAGNOSIS — R252 Cramp and spasm: Secondary | ICD-10-CM | POA: Insufficient documentation

## 2024-08-10 NOTE — Therapy (Signed)
 OUTPATIENT PHYSICAL THERAPY TREATMENT       Patient Name: [name] Trujillo MRN: [id number] DOB:05-Dec-1940, 83 y.o., female Today's Date: 08/10/2024  END OF SESSION:  PT End of Session - 08/10/24 1447     Visit Number 16    Date for Recertification  08/31/24    Authorization Type [employer]    Authorization Time Period 07/07/24 - 08/31/24    Authorization - Visit Number 6    Authorization - Number of Visits 8    Progress Note Due on Visit 24   MD PN for 07/28/24 on visit #14 (07/27/24)   PT Start Time 1447    PT Stop Time 1531    PT Time Calculation (min) 44 min    Activity Tolerance Patient tolerated treatment well    Behavior During Therapy [facility] for tasks assessed/performed                  Past Medical History:  Diagnosis Date   High cholesterol    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   She had ACUTE TAKOTSUBO (STRESS) CARDIOMYOPATHY with elevated Troponin Levels - this would be considered Demand Ischemia - Demand Infarction & NOT associated with ACS/CAD.     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  - Cath with NO CAD. ==> RESOLVED: f/u Echo 03/2022: EF 60-65%.   Past Surgical History:  Procedure Laterality Date   APPENDECTOMY     14-18 yo   BACK SURGERY     Age 45   CESAREAN SECTION     ECTOPIC PREGNANCY SURGERY     LAPAROSCOPIC HYSTERECTOMY     LEFT HEART CATH AND CORONARY ANGIOGRAPHY N/A 01/09/2022   Procedure: LEFT HEART CATH AND CORONARY ANGIOGRAPHY;  Surgeon: Wonda Sharper, MD;  Location: [facility] INVASIVE CV LAB;  Service: CV:: Widely patent coronaries with mild nonobstructive LAD plaquing.  Right dominant system.  Normal LVEDP.  Based on clinical  presentation, findings are consistent with acute Takotsubo Cardiomyopathy Syndrome   POSTERIOR FUSION LUMBAR SPINE  05/24/2022   [facility], [place]; [name], MD): L2-L3 XLIF, L2-L3 POSTERIOR DECOMPRESSION AND FUSION   TRANSTHORACIC ECHOCARDIOGRAM  01/08/2022   Severely decreased LV function-EF 25 to 30%.  Mid to apical (mostly anterior) with normal basal motion.  GR 2 DD-elevated LAP.  Mildly dilated LA.  Aortic sclerosis with no stenosis.  No AI.  Normal MV with mild to moderate TR.  Mildly elevated RAP, and PAP (estimated 49 mg). If LAD CAD ruled out - consistent with Takotsubo CM Syndrome.   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) 12/30/2023   New onset atrial fibrillation (HCC) 12/30/2023   Myopia of both eyes with astigmatism and presbyopia 05/06/2023   Dry eye syndrome of both eyes 05/06/2023   Mild tricuspid regurgitation 05/06/2022   Mild mitral regurgitation 05/06/2022  Hypertension 05/06/2022   History of seizure 05/06/2022   Takotsubo cardiomyopathy-resolved 01/10/2022   Hyperlipidemia with target LDL less than 100 01/10/2022   Hypothyroid 01/10/2022   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) 01/08/2022   Numbness in both legs 04/10/2021   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   Posterior vitreous detachment of both eyes 02/05/2018   Ventricular premature beats 10/31/2015   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   Pure hypercholesterolemia 06/28/2014   Plantar fasciitis 06/28/2014    PCP: Windy Coy, MD   REFERRING PROVIDER: Vernetta Lonni GRADE,  MD  REFERRING DIAG:  [phone] (ICD-10-CM) - Pain in right hip  M54.50 (ICD-10-CM) - Low back pain, unspecified  Eval and treat low back/right pelvis and right hip/ishium Any modalities per therapist   THERAPY DIAG:  Radiculopathy, lumbar region  Pain in right hip  Muscle weakness (generalized)  Cramp and spasm  Other abnormalities of gait and mobility  Unsteadiness on feet  RATIONALE FOR EVALUATION AND TREATMENT: Rehabilitation  ONSET DATE: Memorial Day weekend  NEXT MD VISIT:  To be scheduled 8-10 days s/p lumbar MRI    SUBJECTIVE:                                                                                                                                                                                                         SUBJECTIVE STATEMENT: Pt reports her pain continues to fluctuate with today not being a good day.  She states her daughter, Landry (MD), wants her to go back up to [place] where her neurosurgeon is to have her MRI to allow for better comparison to her prior studies.  She is also scheduled to have her next f/u with the neurosurgeon, Dr. Emmitt on 09/21/24.  She notes her incontinence has gotten worse in recent weeks.   EVAL: [name] Trujillo is well know to me from several prior PT episodes for foot drop, cervical and lumbar radiculopathy.  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  Woke up the next morning with severe R hip/buttock pain.  Pain mostly localized to sciatic and ischial area of her R buttock.  PCP provider told her it was likely hip bursitis but ortho MD feels like it is more likely radicular from her back.  Some relief from steroid injection around her right hip ischial area provided by ortho MD but pain has never fully gone away.   She does have a significant lurch in her gait which is  somewhat more pronounced than on prior PT episodes with this PT.  PAIN: Are you having  pain? Yes: NPRS scale: 3/10 at rest/sitting; 5/10 with standing or walking  Pain location: R lower buttock  Pain description: burning, numb pain  PERTINENT HISTORY:  L2-3 XLIF/PSF 05/24/22; C5-7 ACDF 09/21/20; idiopathic progressive neuropathy; hypthyroidism; DDD; remote h/o back surgery at age 58; h/o scoliosis; Takotsubo cardiomyopathy 01/10/22; CAD; paroxysmal afib; mitral and tricuspid regurgitation; myalgia due to statin; recent cataract surgery  PRECAUTIONS: Fall  RED FLAGS: Bowel or bladder incontinence: Yes: chronic bladder issues, bowel urgency - somewhat worse than before   WEIGHT BEARING RESTRICTIONS: No  FALLS:  Has patient fallen in last 6 months? No  LIVING ENVIRONMENT: Lives with: lives with their spouse Lives in: House/apartment Stairs: Yes: External: 5-6 steps; on left going up Has following equipment at home: Walker - 2 wheeled, shower chair, and hiking/walking poles  OCCUPATION: Retired runner, broadcasting/film/video  PLOF: Independent and Leisure: walking ~30 minutes 1x/day (3x around the court near where she lives); sewing/quilting; likes working in the yard    PATIENT GOALS: To start walking again w/o doing more damage.   OBJECTIVE: (objective measures completed at initial evaluation unless otherwise dated)  DIAGNOSTIC FINDINGS:  04/21/24 - XR RIGHT HIP: An AP pelvis and lateral of the right hip shows well-maintained joint space with no cortical irregularities around the hip or the trochanteric area of the hip.  The lower lumbar spine shows severe degenerative changes at L4-L5 and L5-S1 on the AP view.   08/27/22 - LUMBAR SPINE - COMPLETE 4+ VIEW FINDINGS: There is mild levoconvex curvature of the lumbar spine, unchanged. Alignment is anatomic. There are bilateral transpedicular screws in posterior fusion rods present at L2-L3, new from prior. Disc spacers present. Alignment is anatomic. No fractures are identified. There is moderate severe disc space narrowing at L4-L5 and L5-S1 as  well as L1-L2 similar to the prior study. There are atherosclerotic calcifications of the aorta.   IMPRESSION: 1. Status post posterior fusion at L2-L3. Alignment is anatomic. 2. Multilevel degenerative disc disease, similar to prior study.  PATIENT SURVEYS:  Modified Oswestry:  MODIFIED OSWESTRY DISABILITY SCALE Date:  05/11/2024 07/06/24  Pain intensity 2 =  Pain medication provides me with complete relief from pain. 2  2. Personal care (washing, dressing, etc.) 1 =  I can take care of myself normally, but it increases my pain. 2  3. Lifting 4 = I can lift only very light weights 4  4. Walking 1 = Pain prevents me from walking more than 1 mile. 3  5. Sitting 1 =  I can only sit in my favorite chair as long as I like. 1  6. Standing 2 =  Pain prevents me from standing more than 1 hour 3  7. Sleeping 2 =  Even when I take pain medication, I sleep less than 6 hours 1  8. Social Life 2 = Pain prevents me from participating in more energetic activities (eg. sports, dancing). 3  9. Traveling 2 =  My pain restricts my travel over 2 hours. 3  10. Employment/ Homemaking 2 = I can perform most of my homemaking/job duties, but pain prevents me from performing more physically stressful activities (eg, lifting, vacuuming). 2  Total 17 / 50 = 34.0 % 24 / 50 = 48.0 %  Interpretation Moderate disability Severe disability   Interpretation of scores: Score Category Description  0-20% Minimal Disability The patient can cope with most living activities. Usually no  treatment is indicated apart from advice on lifting, sitting and exercise  21-40% Moderate Disability The patient experiences more pain and difficulty with sitting, lifting and standing. Travel and social life are more difficult and they may be disabled from work. Personal care, sexual activity and sleeping are not grossly affected, and the patient can usually be managed by conservative means  41-60% Severe Disability Pain remains the main problem  in this group, but activities of daily living are affected. These patients require a detailed investigation  61-80% Crippled Back pain impinges on all aspects of the patient's life. Positive intervention is required  81-100% Bed-bound These patients are either bed-bound or exaggerating their symptoms  Bluford FORBES Zoe DELENA Karon DELENA, et al. Surgery versus conservative management of stable thoracolumbar fracture: the PRESTO feasibility RCT. [place] ([place]): [employer]; 2021 Nov. [facility] Technology Assessment, No. 25.62.) Appendix 3, Oswestry Disability Index category descriptors. Available from: Findjewel[employer]cz Minimally Clinically Important Difference (MCID) = 12.8%  LEFS  Extreme difficulty/unable (0), Quite a bit of difficulty (1), Moderate difficulty (2), Little difficulty (3), No difficulty (4) Survey date:  05/11/24 07/06/24   Any of your usual work, housework or school activities 1 2  2. Usual hobbies, recreational or sporting activities 2 2  3. Getting into/out of the bath 2 2  4. Walking between rooms 2 4  5. Putting on socks/shoes 2 4  6. Squatting  0 2  7. Lifting an object, like a bag of groceries from the floor 2 3  8. Performing light activities around your home 2 2  9. Performing heavy activities around your home 0 1  10. Getting into/out of a car 2 1  11. Walking 2 blocks 0 1  12. Walking 1 mile 2 0  13. Going up/down 10 stairs (1 flight) 2 3  14. Standing for 1 hour 2 1  15.  sitting for 1 hour 3 4  16. Running on even ground 0 0  17. Running on uneven ground 0 0  18. Making sharp turns while running fast 0 0  19. Hopping  0 0  20. Rolling over in bed 2 3  Score total:   26 / 80 = 32.5 % 35 / 80 = 43.8 %  Functional limitation: Moderate Moderate     SCREENING FOR RED FLAGS: Bowel or bladder incontinence: Yes: chronic bladder issues, bowel urgency - somewhat worse than before Spinal tumors: No Cauda equina syndrome:  No Compression fracture: No Abdominal aneurysm: No  COGNITION:  Overall cognitive status: Within functional limits for tasks assessed    SENSATION: WFL Except B distal LE neuropathy  POSTURE:  rounded shoulders, forward head, right pelvic obliquity, and scoliosis  PALPATION: Increased muscle tension in L>R lumbar paraspinals and R>L glutes and piriformis with TTP over inferior glutes and piriformis near hamstring origin  LUMBAR ROM: *Lumbar ROM assessed with UE support for balance and close SBA/CGA of PT  Active  Eval  Flexion Hands to distal shins  Extension 50% limited  Right lateral flexion Hand to upper thigh - p!  Left lateral flexion Hand to lateral knee  Right rotation 50% limited  Left rotation 30% limited - p!  (Blank rows = not tested)  MUSCLE LENGTH: Hamstrings: mild/mod tight B ITB: mild tight B Piriformis: mod/severe tight B Hip IR: mod tight B Hip flexors: mild tight R Quads: mild tight R>L Heelcord: mild/mod tight B  LOWER EXTREMITY ROM:    Grossly WFL other than limitations indicated above due  to muscle tightness   LOWER EXTREMITY MMT:    MMT Right eval Left eval R 07/06/24 L 07/06/24  Hip flexion 3+ 4- 4- 4  Hip extension 4- 4 4 4   Hip abduction 3+ 4- 4 4  Hip adduction 3+ 4- 4- 4  Hip internal rotation 4- 4- 4+ 4  Hip external rotation 3+ 3+ 4 4-  Knee flexion 4 4 4+ 4+  Knee extension 4 4 4+ 4+  Ankle dorsiflexion 3- 3- 3+ 3-  Ankle plantarflexion      Ankle inversion      Ankle eversion       (Blank rows = not tested)  FUNCTIONAL TESTS: (Assessment completed 05/14/24 unless otherwise indicated) 5 times sit to stand: 12.37 sec w/o UE assist - 06/01/24 Timed up and go (TUG): 13.75 sec with single hiking pole 10 meter walk test: 16.00 sec with single hiking pole Gait speed: 2.05 ft/sec with single hiking pole Berg Balance Scale: 42/56; 37-45 significant (>80%) - 05/19/24 Functional gait assessment: 10/30, < 19 = high risk fall     Standardized testing results as of discharge from latest PT episode (11/11/23): 5xSTS = 11.84 sec  = 13.63 sec w/o AD Gait speed = 2.41 ft/sec w/o AD Berg = 45/56; scores of 37-45 indicate significant (>80%) fall risk  FGA = 19/30; scores of 19-24 indicate a medium risk for falls   GAIT: Distance walked: Clinic distances Assistive device utilized: Single walking/trekking pole on R Level of assistance: SBA Gait pattern: Increased sway, step through pattern, decreased stride length, trendelenburg, lateral hip instability, poor foot clearance- Right, and poor foot clearance- Left Comments: B foot slap   TODAY'S TREATMENT:    08/10/2024  THERAPEUTIC EXERCISE: To improve strength, endurance, ROM, and flexibility.  Demonstration, verbal and tactile cues throughout for technique.  Rec Bike - L4 x 6' Seated 3-way lumbar flexion stretch with UE support on hiking pole 3 x 30 - center and L stretch triggering R anterolateral thigh pain, no radicular pain with R-sided stretch Standing lateral lean ITB/QL stretch holding onto wall ladder 2 x 30, 2nd rep adding slight trunk rotation toward ladder Standing lumbar flexion stretch holding onto wall ladder 2 x 30 leaning straight back 2 x 30 with slight shift of hips to R  THERAPEUTIC ACTIVITIES: To improve functional performance.  Demonstration, verbal and tactile cues throughout for technique. Standing R hip extension diagonal with L foot on 2 block x 5 - mod R hip pain - discontinued d/t increased pain Standing L hip extension diagonal with R foot on 2 block x 5 - mod/severe increased R hip pain with more limited ROM and increased difficulty maintaining level pelvis - discontinued Mini squat x 5 - discontinued d/t increased pain Supported squat (re-attempted after stretches w/o increased pain) x 10  MANUAL THERAPY: To promote improved flexibility, pain modulation, and reduced pain utilizing manual TP therapy.  Attempted self-STM/release  to R glutes with lacrosse ball on wall but pt reporting increased pain and not the normal release/relief that she typically experiences with this.   07/29/2024  THERAPEUTIC EXERCISE: To improve strength, endurance, and flexibility.   Rec Bike - L4 x 6'  SELF CARE:   Answered patient's questions and provided education regarding what MRI can determine, explaining difference between spinal canal stenosis and foraminal stenosis which are both present on her past MRIs and how her current symptoms correlate.  NEUROMUSCULAR RE-EDUCATION: To improve balance, coordination, kinesthesia, posture, proprioception, and reduce fall risk. Corner  balance progression with narrow BOS on firm surface with arms crossed on chest            Eyes open: static stance 3 x 30 sec horiz head turns x 5 vertical head nods x 5 Pt noted to center majority of weight on L LE   THERAPEUTIC ACTIVITIES: To improve functional performance.  Demonstration, verbal and tactile cues throughout for technique. Standing R hip extension diagonal with L foot on 2 block x 10 - mild to no increased R hip pain Standing L hip extension diagonal with R foot on 2 block x 10 - mod increased R hip pain with more limited ROM and increased difficulty maintaining level pelvis Standing L hip hike with R foot on 2 block x 5 - limited by increased pain   07/27/2024  THERAPEUTIC EXERCISE: To improve strength and endurance.   Rec Bike - L4 x 4', L3 x 2'  PHYSICAL PERFORMANCE TEST or MEASUREMENT: Berg Balance Test  Sit to Stand Able to stand without using hands and stabilize independently   Standing Unsupported Able to stand safely 2 minutes   Sitting with Back Unsupported but Feet Supported on Floor or Stool Able to sit safely and securely 2 minutes   Stand to Sit Sits safely with minimal use of hands   Transfers Able to transfer safely, minor use of hands   Standing Unsupported with Eyes Closed Able to stand 3 seconds   Standing  Unsupported with Feet Together Able to place feet together independently but unable to hold for 30 seconds   From Standing, Reach Forward with Outstretched Arm Can reach forward >12 cm safely (5)   From Standing Position, Pick up Object from Floor Able to pick up shoe safely and easily   From Standing Position, Turn to Look Behind Over each Shoulder Turn sideways only but maintains balance   Turn 360 Degrees Able to turn 360 degrees safely one side only in 4 seconds or less   Standing Unsupported, Alternately Place Feet on Step/Stool Able to stand independently and complete 8 steps >20 seconds   Standing Unsupported, One Foot in [employer] balance while stepping or standing   Standing on One Leg Tries to lift leg/unable to hold 3 seconds but remains standing independently   Total Score 40   Berg comment: 37-45 = Significant (>80%) fall risk       THERAPEUTIC ACTIVITIES: To improve functional performance.  Demonstration, verbal and tactile cues throughout for technique.  = 13.56 sec with single trekking pole, 13.10 sec w/o AD Gait speed = 2.42 ft/sec with single trekking pole, 2.50 ft/sec w/o AD Goal assessment   07/15/2024 THERAPEUTIC EXERCISE: To improve strength and endurance.    Rec Bike - L4 x 6 min  THERAPEUTIC ACTIVITIES: To improve functional performance.  Demonstration, verbal and tactile cues throughout for technique. Standing R hip hike with L foot on 2 block x 10 Standing L hip hike with R foot on 2 block x 5 - limited by increased pain Standing R hip extension diagonal with L foot on 2 block x 10 - mild increased R hip pain Standing L hip extension diagonal with R foot on 2 block x 10 - mod increased R hip pain with more limited ROM Standing R hip extension diagonal with L foot on 2 block and torso leaning on pillow + white wedge on elevated mat table x 10 - less pain noted Standing L hip extension diagonal with R foot on 2 block  and torso leaning on pillow +  white wedge on elevated mat table x 10 - less pain noted  Eccentric squat touch to elevated mat table with UE support on back of chair x 10 Lateral step & lift over cone x 10 bil (simulating stepping over edge of tub) - mild increased pain in R buttock with L step-over   07/13/2024  THERAPEUTIC EXERCISE: To improve strength, endurance, ROM, and flexibility.  Demonstration, verbal and tactile cues throughout for technique.  Rec Bike - L4 x 4 min, L3 x 2 min Manual R hip flexor stretch x 30 Manual R TFL stretch x 30 Manual R ITB stretch x 30 S/L RTB clam x 10 bil Bridge + RTB hip ABD isometric - deferred due to increased R buttock and LE radicular pain Bridge + manual PT resisted hip ABD isometric - deferred due to increased R buttock and LE radicular pain Bridge + hip ADD ball squeeze isometric - deferred due to increased R buttock and LE radicular pain  MANUAL THERAPY: To promote movement utilizing connective tissue massage, therapeutic massage, manual TP therapy, and myofascial release. STM/DTM, manual TPR to R distal glutes, piriformis, TFL, and ITB  THERAPEUTIC ACTIVITIES: To improve functional performance.  Demonstration, verbal and tactile cues throughout for technique. Standing hip extension diagonal with looped RTB at distal thighs x 8-10 bil (limited due to fatigue) - added to HEP without RTB Standing L/R hip hike with R foot on 2 block 2 x 10 Standing L/R hip circumduction with opp foot standing on 2 block - focusing on maintaining level pelvis x ~10 each side   07/08/2024  THERAPEUTIC EXERCISE: To improve strength, endurance, ROM, and flexibility.  Demonstration, verbal and tactile cues throughout for technique.  Rec Bike - L4 x 3 min, L3 x 3 min - resistance level reduced due to increasing fatigue Supine manual stretches: R hamstring stretch x 30 R mod Thomas quad/hip flexor stretch x 30 R glute cross body stretch x 30 R ITB cross body stretch x 30 R KTOS  piriformis stretch x 30 R figure-4 to chest piriformis stretch x 30  NEUROMUSCULAR RE-EDUCATION: To improve coordination, kinesthesia, and proprioception.  Hooklying R sciatic nerve glide x 10 - patient reporting feeling of less motion at toes during nerve glide Supine/long sitting R isometric toe curls into therapist's hand x 10 Attempted long sitting R toe curls into YTB however patient unable to create AROM against resistance band  MANUAL THERAPY: To promote normalized muscle tension, improved flexibility, and reduced pain utilizing connective tissue massage, therapeutic massage, manual TP therapy, and myofascial release.  STM/DTM, manual TPR, and percussive massage with massage gun to R distal glutes, piriformis, TFL, distal iliopsoas, vastus lateralis and ITB  MODALITIES: (non-billable) Moist heat pack to R glutes x 10' at end of session   07/06/2024 - Recert THERAPEUTIC EXERCISE: To improve strength and endurance.   Rec Bike - L4 x 3 min, L3 x 3 min - resistance level reduced due to increasing fatigue  THERAPEUTIC ACTIVITIES: To improve functional performance.  Demonstration, verbal and tactile cues throughout for technique.  LE MMT - see above LE MMT table LEFS: 35/80 = 43.8% indicating moderate functional limitation Modified Oswestry: 24/50 = 48% or severe disability Gait speed = 2.08 ft/sec with single hiking pole Goal assessment  PHYSICAL PERFORMANCE TEST or MEASUREMENT: Functional Gait  Assessment  Gait Level Surface Walks 20 ft, slow speed, abnormal gait pattern, evidence for imbalance or deviates 10-15 in outside of the 12  in walkway width. Requires more than 7 sec to ambulate 20 ft.   Change in Gait Speed Makes only minor adjustments to walking speed, or accomplishes a change in speed with significant gait deviations, deviates 10-15 in outside the 12 in walkway width, or changes speed but loses balance but is able to recover and continue walking.   Gait with Horizontal  Head Turns Performs head turns with moderate changes in gait velocity, slows down, deviates 10-15 in outside 12 in walkway width but recovers, can continue to walk.   Gait with Vertical Head Turns Performs task with slight change in gait velocity (eg, minor disruption to smooth gait path), deviates 6 - 10 in outside 12 in walkway width or uses assistive device   Gait and Pivot Turn Pivot turns safely in greater than 3 sec and stops with no loss of balance, or pivot turns safely within 3 sec and stops with mild imbalance, requires small steps to catch balance.   Step Over Obstacle Is able to step over one shoe box (4.5 in total height) without changing gait speed. No evidence of imbalance.   Gait with Narrow Base of Support Ambulates less than 4 steps heel to toe or cannot perform without assistance.   Gait with Eyes Closed Cannot walk 20 ft without assistance, severe gait deviations or imbalance, deviates greater than 15 in outside 12 in walkway width or will not attempt task.   Ambulating Backwards Walks 20 ft, slow speed, abnormal gait pattern, evidence for imbalance, deviates 10-15 in outside 12 in walkway width.   Steps Alternating feet, must use rail.   Total Score 12   FGA comment: < 19 = high risk fall        07/01/2024  THERAPEUTIC EXERCISE: To improve strength, endurance, ROM, and flexibility.  Demonstration, verbal and tactile cues throughout for technique.  Rec Bike - L3 x 6 min Seated 3-way thoracolumbar flexion stretch 2 x 30 each position, using hiking pole for UE support Supine R sciatic nerve glides 2 x 10 bil Hooklying R figure-4 piriformis stretch x 30 Hooklying R KTOS piriformis stretch x 30 - pt noting increased groin anterior thigh discomfort Mod thomas R quad/hip flexor stretch 2 x 30 - 1 rep each w/o and with strap Supine R HS stretch with strap x 30 Supine R ITB stretch with strap x 30   MANUAL THERAPY: To promote normalized muscle tension, improved flexibility,  improved joint mobility, and reduced pain utilizing connective tissue massage, therapeutic massage, manual TP therapy, and myofascial release.  STM/DTM and manual TPR to R lower glutes and piriformis   SELF CARE:  Provided education on self-STM/muscle release options and need to break up her travel to [place] with frequent stops to allow her to stretch and walk around.   MODALITIES: (non-billable) Moist heat pack to R glutes x 10' at end of session   06/24/2024 THERAPEUTIC EXERCISE: To improve strength, endurance, ROM, and flexibility.  Demonstration, verbal and tactile cues throughout for technique.  Rec Bike - L3 x 6 min Seated 3-way thoracolumbar flexion stretch 2 x 30 each position, using hiking pole for UE support, clarifying angle for lateral stretches Seated B foot/ankle arch raises x 10 Seated toe curls/towel scrunches - patient with difficulty achieving grip of towel with toes therefore suggested patient try using a small object like a pen or pencil to work on grip with toes at home  NEUROMUSCULAR RE-EDUCATION: To improve coordination, kinesthesia, and reduce nerve irritation. Seated sciatic nerve  glides - difficulty coordinating movement  Supine sciatic nerve glides 2 x 10 bil Supine sciatic nerve glides with 90/90 knee flexion x 10 bil - patient reporting better response to previous exercise  THERAPEUTIC ACTIVITIES: To improve functional performance.  Demonstration, verbal and tactile cues throughout for technique. Unsupported mini-squat hovering over mat table with arms forward for balance (chair placed in front for safety with intermittent support) x 10 - attempted in bare feet today as patient feeling like she is losing proprioceptive awareness and control in her feet, however still demonstrating frequent loss of eccentric control/uncontrolled descent onto mat table   06/22/2024 THERAPEUTIC EXERCISE: To improve strength, endurance, and flexibility.  Demonstration,  verbal and tactile cues throughout for technique.  Rec Bike - L3 x 6 min Seated 3-way thoracolumbar flexion stretch 2 x 30 each position using hiking pole for UE support  THERAPEUTIC ACTIVITIES: To improve functional performance.  Demonstration, verbal and tactile cues throughout for technique. Unsupported mini-squat hovering over mat table with arms forward for balance (chair placed in front for safety with intermittent support) x 10 - more frequent loss of eccentric control/uncontrolled descent onto mat table Unsupported mini-squat with glute touch touch to rounded bolster on mat table with arms forward for balance (chair placed in front for safety with intermittent support) x 10  NEUROMUSCULAR RE-EDUCATION: To improve balance, coordination, kinesthesia, posture, proprioception, and amplitude of movement.  S/L R hip ABD/ext diagonals 2 x 5 - L knee flexed for increased ant/post stability to prevent pt from rolling backward & PT guiding motion for 1st few reps; repeated x 10 with L LE  S/L R hip circumduction arc over L LE tapping R foot in front and behind L foot x 5 - very limited eccentric control with posterior tap; repeated x 10 with L LE   06/08/2024 THERAPEUTIC EXERCISE: To improve strength, endurance, ROM, and flexibility.  Demonstration, verbal and tactile cues throughout for technique. Rec Bike - L3 x 6 min Seated 3-way thoracolumbar flexion stretch 2 x 30 each position - 1st reps with hands on green Pball, 2nd reps using hiking pole for UE support Quadruped 3-way child's pose x 30 each  THERAPEUTIC ACTIVITIES: To improve functional performance.  Demonstration, verbal and tactile cues throughout for technique. Reviewed proper lifting mechanics using squatting motion and golfer's lift  Unsupported mini-squat hovering over mat table with arms forward for balance (chair placed in front for safety but not needed for support) x 8  SELF CARE:  Provided education on managing muscle  cramping through stretching, increased hydration, and potential Mg supplement (instructed pt to verify with PCP that there are no concerns or potential medicine interactions with trying Mg supplement).   06/03/2024  THERAPEUTIC EXERCISE: To improve strength, endurance, and flexibility.  Demonstration, verbal and tactile cues throughout for technique.  Rec Bike - L3 x 6 min Standing lateral lean R ITB over back of chair 2 x 30 Supine ITB stretch with strap 3 x 30 bil  MANUAL THERAPY: To promote normalized muscle tension, improved flexibility, and reduced pain utilizing connective tissue massage, therapeutic massage, manual TP therapy, and myofascial release.  STM/DTM, IASTM with foam roller and manual TPR to R distal glutes, HS and ITB  NEUROMUSCULAR RE-EDUCATION: To improve coordination, kinesthesia, posture, proprioception, and amplitude of movement.  S/L R hip ABD/ext diagonals x 10 - slight PT assist initially to stabilize hips to prevent anterior/posterior rotation & VC/TC to avoid hip ER S/L R hip circumduction arc over L LE tapping R  foot in front and behind L foot x 5 - very limited eccentric control with posterior tap   SELF CARE:  HEP update Provided instruction in self-STM techniques to R ITB using rolling pin.    06/01/2024 THERAPEUTIC EXERCISE: To improve strength and endurance.  Demonstration, verbal and tactile cues throughout for technique. NuStep - L4 x 6 min (LE only)  THERAPEUTIC ACTIVITIES: To improve functional performance.  Demonstration, verbal and tactile cues throughout for technique. 5xSTS = 12.37 sec w/o UE assist, somewhat uncontrolled descent esp on last rep  TRIGGER POINT DRY NEEDLING: Treatment instructions/education: Initial Treatment: Pt instructed on Dry Needling rational, procedures, and possible side effects. Pt instructed to expect mild to moderate muscle soreness later in the day and/or into the next day.  Pt instructed in methods to reduce muscle  soreness. Pt instructed to continue prescribed HEP. Patient was educated on signs and symptoms of infection and other risk factors and advised to seek medical attention should they occur.  Patient verbalized understanding of these instructions and education.  Education Handout Provided: Yes Consent: Patient Verbal Consent Given: Yes Treatment: Muscles Treated: R medial and lateral piriformis, R glute medius Utilized skilled palpation to identify bony landmarks and trigger points along with monitoring of soft tissue during DN. Electrical Stimulation Performed: No Treatment Response/Outcome: Twitch response elicited, Palpable increase in muscle length, Decreased tissue resistance noted, Decreased pain/TTP, and Improved exercise tolerance  MANUAL THERAPY: To promote normalized muscle tension, improved flexibility, and reduced pain. STM/DTM, manual TPR and pin & stretch to muscles addressed with DN   NEUROMUSCULAR RE-EDUCATION: To improve balance, coordination, kinesthesia, posture, proprioception, and amplitude of movement. Mini-squat with counter support x 10   05/19/24 THERAPEUTIC EXERCISE: To improve ROM and flexibility.  Demonstration, verbal and tactile cues throughout for technique.  Bike L3x72min Standing runner stretch x 30 BLE Standing soleus stretch x 30 BLE Table piriformis stretch 2x30 RLE Supine L figure 4 stretch 2x30 LLE Supine glute stretch 2x30 BLE Supine KTOS stretch 2x30 BLE  Seated clams with GTB + trA x 10 BLE Seated marching GTB x + TrA 10 BLE Berg Balance Test  Sit to Stand Able to stand without using hands and stabilize independently   Standing Unsupported Able to stand 2 minutes with supervision   Sitting with Back Unsupported but Feet Supported on Floor or Stool Able to sit safely and securely 2 minutes   Stand to Sit Sits safely with minimal use of hands   Transfers Able to transfer safely, minor use of hands   Standing Unsupported with Eyes Closed  Able to stand 10 seconds with supervision   Standing Unsupported with Feet Together Able to place feet together independently and stand 1 minute safely   From Standing, Reach Forward with Outstretched Arm Can reach confidently >25 cm (10)   From Standing Position, Pick up Object from Floor Able to pick up shoe safely and easily   From Standing Position, Turn to Look Behind Over each Shoulder Looks behind one side only/other side shows less weight shift   Turn 360 Degrees Able to turn 360 degrees safely but slowly   Standing Unsupported, Alternately Place Feet on Step/Stool Able to complete 4 steps without aid or supervision   Standing Unsupported, One Foot in Front Needs help to step but can hold 15 seconds   Standing on One Leg Unable to try or needs assist to prevent fall   Total Score 42       05/14/2024  THERAPEUTIC ACTIVITIES: To  improve functional performance.  Demonstration, verbal and tactile cues throughout for technique.  5xSTS: Deferred due to increased pain with uncontrolled descent upon sitting TUG: 13.75 sec with single hiking pole : 16.00 sec with single hiking pole Gait speed: 2.05 ft/sec with single hiking pole  PHYSICAL PERFORMANCE TEST or MEASUREMENT: Functional Gait  Assessment  Gait Level Surface Walks 20 ft, slow speed, abnormal gait pattern, evidence for imbalance or deviates 10-15 in outside of the 12 in walkway width. Requires more than 7 sec to ambulate 20 ft.   Change in Gait Speed Makes only minor adjustments to walking speed, or accomplishes a change in speed with significant gait deviations, deviates 10-15 in outside the 12 in walkway width, or changes speed but loses balance but is able to recover and continue walking.   Gait with Horizontal Head Turns Performs head turns with moderate changes in gait velocity, slows down, deviates 10-15 in outside 12 in walkway width but recovers, can continue to walk.   Gait with Vertical Head Turns Performs task with  moderate change in gait velocity, slows down, deviates 10-15 in outside 12 in walkway width but recovers, can continue to walk.   Gait and Pivot Turn Turns slowly, requires verbal cueing, or requires several small steps to catch balance following turn and stop   Step Over Obstacle Is able to step over one shoe box (4.5 in total height) without changing gait speed. No evidence of imbalance.   Gait with Narrow Base of Support Ambulates less than 4 steps heel to toe or cannot perform without assistance.   Gait with Eyes Closed Cannot walk 20 ft without assistance, severe gait deviations or imbalance, deviates greater than 15 in outside 12 in walkway width or will not attempt task.   Ambulating Backwards Walks 20 ft, slow speed, abnormal gait pattern, evidence for imbalance, deviates 10-15 in outside 12 in walkway width.   Steps Alternating feet, must use rail.   Total Score 10   FGA comment: < 19 = high risk fall      THERAPEUTIC EXERCISE: To improve ROM and flexibility.  Demonstration, verbal and tactile cues throughout for technique.  Hooklying R HS stretch with strap hamstring 2 x 30 Supine gluteus stretch 2 x 30 bil Hooklying figure-4 piriformis stretch x 30 on L, deferred d/t increased pain on R Hooklying KTOS piriformis stretch x 30 on L, deferred d/t increased pain on R Side-sitting R modified pigeon piriformis stretch 2 x 30 - good stretch noted without increased pain Seated R KTOS piriformis stretch 2 x 30 - good stretch noted without increased pain  SELF CARE:   Modified HEP to address concerns regarding increased R buttock/hip pain with piriformis stretching, with patient reporting better tolerance for seated piriformis stretching.  Patient cautioned not to force any painful ROM/stretching and report back to PT next visit if HEP continues to irritate or cause increased pain. Provided instruction in self-STM techniques to R glutes/piriformis using tennis ball against wall or in  chair.    05/04/2024  SELF CARE:  Reviewed eval findings and role of PT in addressing identified deficits as well as instruction in initial HEP (see below).    PATIENT EDUCATION:  Education details: continue with current HEP  Person educated: Patient Education method: Explanation Education comprehension: verbalized understanding  HOME EXERCISE PROGRAM: Access Code: [facility] URL: [email or web address] Date: 07/13/2024 Prepared by: Elijah Hidden  Exercises - Standing Soleus Stretch  - 1 x daily - 7 x weekly - 3 sets -  3 reps - 30 sec hold - Hooklying Hamstring Stretch with Strap  - 2 x daily - 7 x weekly - 3 reps - 30 sec hold - Supine Gluteus Stretch  - 2 x daily - 7 x weekly - 3 reps - 30 sec hold - Supine Figure 4 Piriformis Stretch  - 2 x daily - 7 x weekly - 3 reps - 30 sec hold - Supine Piriformis Stretch with Foot on Ground  - 2 x daily - 7 x weekly - 3 reps - 30 sec hold - Seated Table Piriformis Stretch  - 2 x daily - 7 x weekly - 3 reps - 30 sec hold - Seated Piriformis Stretch  - 2 x daily - 7 x weekly - 3 reps - 30 sec hold - Seated March with Resistance  - 1 x daily - 7 x weekly - 2 sets - 10 reps - Seated Isometric Hip Abduction with Resistance  - 1 x daily - 7 x weekly - 2 sets - 10 reps - Glute Max Release With [employer] Against Wall  - 1 x daily - 7 x weekly - 1-2 min hold - Mini Squat with Counter Support  - 1 x daily - 3 x weekly - 2 sets - 10 reps - 3-5 sec hold - Standing ITB Stretch (Mirrored)  - 2 x daily - 7 x weekly - 3 reps - 30 sec hold - Supine Iliotibial Band Stretch with Strap  - 2 x daily - 7 x weekly - 3 reps - 30 sec hold - Sidelying Diagonal Hip Abduction  - 1 x daily - 3 x weekly - 2 sets - 10 reps - 3 sec hold - Sidelying Hip Abduction and Extension (Mirrored)  - 1 x daily - 3 x weekly - 2 sets - 5 reps - 3 sec hold - Seated 3 Way Exercise Ball Roll Out Stretch  - 1 x daily - 7 x weekly - 2 sets - 3 reps - 30 sec hold - Mini Squat  with Chair  - 1 x daily - 3 x weekly - 2 sets - 10 reps - 3 sec hold - Supine Sciatic Nerve Glide  - 1 x daily - 7 x weekly - 2 sets - 10 reps - 3 sec hold - Hip Hiking on Step  - 1 x daily - 3 x weekly - 2 sets - 10 reps - 3 sec hold - Diagonal Hip Extension  - 1 x daily - 3 x weekly - 2 sets - 10 reps - 3 sec hold  Patient Education - Lifting Techniques   ASSESSMENT:  CLINICAL IMPRESSION: [name] Trujillo reports her pain continues to fluctuate with pain increased today after increased activity over the Thanksgiving holiday - pain predominantly in the R buttock but also extending into R anterolateral thigh at times.  She reports worsening of her urinary incontinence over the past few weeks, with increased incidence of accidents and difficulty getting to the bathroom in time - patient encouraged to make sure to let Dr. Vernetta and her neurosurgeon, Dr. Emmitt, aware of this especially if symptoms continue to worsen.  More limited tolerance for therapeutic exercises and interventions today, with exercises and activities that did not typically trigger increased pain on previous visits being more painful today.  We were able to achieve some reduction in her pain with self tractioned lumbar flexion stretch holding onto wall ladder for stability, however pain recurred as patient starting to walk out of the  PT clinic at end of session.  She seems to be demonstrating increased gait instability, therefore we will plan for reassessment of B LE strength next visit.  [name] Trujillo will benefit from continued skilled PT to address ongoing flexibility, strength and balance deficits deficits to improve mobility and activity tolerance with decreased pain interference and decreased risk for falls.  She will reach the end of her current POC this month and only has 2 visits remaining and her current [facility] authorization.  Given continued significant fluctuation in symptoms and pending MRI, we will probably place her on hold  for PT until further evaluation completed by orthopedic surgeon and neurosurgeon.    EVAL: [name] Trujillo is a 83 y.o. female who was referred to physical therapy for evaluation and treatment for R hip/buttock pain presumed to be the result of lumbar radiculopathy.  Patient reports onset of R buttock pain beginning Memorial Day weekend 2025.  Pain is worse with walking and positional changes.  Patient has deficits in hip ROM, proximal LE flexibility, B LE strength, abnormal posture, and TTP with abnormal muscle tension in R lower glutes and piriformis which are interfering with ADLs and are impacting quality of life.  On Modified Oswestry patient scored 17/50 demonstrating 34% or moderate disability.  On LEFS patient scored 26/80 demonstrating moderate functional limitation.  Given her significant LE weakness and impaired gait pattern, will plan more formalized balance testing next visit.  [place] will benefit from skilled PT to address above deficits to improve mobility and activity tolerance with decreased pain interference.     OBJECTIVE IMPAIRMENTS: Abnormal gait, decreased activity tolerance, decreased balance, decreased knowledge of condition, decreased mobility, difficulty walking, decreased ROM, decreased strength, increased fascial restrictions, impaired perceived functional ability, increased muscle spasms, impaired flexibility, impaired sensation, improper body mechanics, postural dysfunction, and pain.   ACTIVITY LIMITATIONS: carrying, lifting, bending, standing, squatting, sleeping, stairs, transfers, bed mobility, bathing, toileting, dressing, reach over head, hygiene/grooming, locomotion level, and caring for others  PARTICIPATION LIMITATIONS: meal prep, cleaning, laundry, driving, shopping, community activity, yard work, and church  PERSONAL FACTORS: Age, Past/current experiences, Time since onset of injury/illness/exacerbation, and 3+ comorbidities: L2-3 XLIF/PSF 05/24/22; C5-7 ACDF  09/21/20; idiopathic progressive neuropathy; hypthyroidism; DDD; remote h/o back surgery at age 85; h/o scoliosis; 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   REHAB POTENTIAL: Good  CLINICAL DECISION MAKING: Unstable/unpredictable  EVALUATION COMPLEXITY: High   GOALS: Goals reviewed with patient? Yes  SHORT TERM GOALS: Target date: 06/15/2024  Patient will be independent with initial HEP to improve outcomes and carryover.  Baseline: Initial HEP provided on eval Goal status: MET - 05/19/24  2.  Patient will report 25% improvement in low back pain to improve QOL. Baseline: 2/10 on eval, up to 10/10 06/03/24 - improvement in pain noted by end of today's session Goal status: MET - 06/08/24 - Pt reports 50+% improvement in pain thus far  3.  Complete standardized balance assessment and update LTG's as indicated. Baseline:  05/14/24 - , TUG and FGA completed; Berg and 5xSTS pending  05/19/24 GLENWOOD Levins completed Goal status: MET - 06/01/24 - 5xSTS completed  LONG TERM GOALS: Target date: 07/20/2024, extended to 08/31/2024  Patient will be independent with ongoing/advanced HEP for self-management at home.  Baseline:  06/22/24 - HEP reviewed today 07/06/24 - no concerns with current HEP Goal status: IN PROGRESS - 08/10/24 - Encouraged patient to continue to  work on HEP without increased pain as tolerated  2.  Patient will report 50-75% improvement in R hip/pelvic pain to improve QOL.  Baseline: 2/10 on eval, up to 10/10 06/08/24 - Pt reports 50+% improvement in pain thus far 07/06/24 - 50-60% improvement overall, but still having periods of more intense pain 07/27/24 - still at least 50% improvement overall, but pain still fluctuating  Goal status: IN PROGRESS - 08/10/24 - pain still fluctuating, but slightly worse after increased activity over  Thanksgiving holiday  3.  Patient will demonstrate improved B proximal LE strength to >/= 4 to 4+/5 for improved stability and ease of mobility. Baseline: Refer to above LE MMT table Goal status: IN PROGRESS - 07/06/24   4.  Patient will report >/= 35/80 on LEFS (MCID = 9 pts) to demonstrate improved functional ability. Baseline: 26 / 80 = 32.5 % Goal status: MET - 07/06/24 - 35 / 80 = 43.8 %  5. Patient will report </= 22% on Modified Oswestry (MCID = 12%) to demonstrate improved functional ability with decreased pain interference. Baseline: 17 / 50 = 34.0 % Goal status: IN PROGRESS - 07/06/24 - 24 / 50 = 48.0 %  6.  Patient will resume her normal walking routine w/o increased pain to allow for  improved mobility and activity tolerance. Baseline: R ischial pain limiting walking tolerance 07/06/24 - attempted to try walking in her neighborhood last week but experienced increased pain while trying to walk up a incline that caused her to have to stop and return home  Goal status: IN PROGRESS - 07/27/24 - patient reports she has been walking ~1/4 of her normal distance staying on level ground and not walking nearly as often as normal (was usually walking daily)  7.  Patient will improve Berg score by at least 8 points or to >/= 45/56 (baseline as of prior D/C from PT) to improve safety and stability with ADLs in standing and reduce risk for falls.  Baseline: 42/56 (05/19/24) Goal status: IN PROGRESS - 07/27/24 - individual item scores fluctuating but overall score decreased by 2 points to 40/56  8.  Patient will improve FGA score by at least 4 points or to >/= 19/30 (baseline as of prior D/C from PT) to improve gait stability and reduce risk for falls.   Baseline: 10/30 (05/14/24) Goal status: IN PROGRESS - 07/06/24 - 12/30  9.  Patient will improve gait velocity to at least 2.62 ft/sec for improved gait efficiency and safety with community ambulation.  Baseline: 2.05 ft/sec with single hiking  pole (05/14/24) 07/06/24 - 2.08 ft/sec with single hiking pole Goal status: IN PROGRESS - 07/27/24 - 07/27/24 - 2.42 ft/sec with single trekking pole, 2.50 ft/sec w/o AD   PLAN:  PT FREQUENCY: 2x/week  PT DURATION: 8 weeks  PLANNED INTERVENTIONS: 97164- PT Re-evaluation, 97750- Physical Performance Testing, 97110-Therapeutic exercises, 97530- Therapeutic activity, 97112- Neuromuscular re-education, 97535- Self Care, [id number]- Manual therapy, [id number]- Gait training, [phone]- Electrical stimulation (unattended), 97035- Ultrasound, [id number]- Ionotophoresis 4mg /ml Dexamethasone, 79439 (1-2 muscles), 20561 (3+ muscles)- Dry Needling, Patient/Family education, Balance training, Stair training, Taping, Joint mobilization, DME instructions, Cryotherapy, and Moist heat  PLAN FOR NEXT SESSION: Reassess LE MMT and remaining LTG's as able; stretch progression as indicated; progress gentle lumbopelvic/proximal LE strengthening as pain allows; MT +/- TPDN to address abnormal muscle tension in glutes and piriformis; potential trial of TENS unit if pain persisting; balance training to decrease fall risk   Elijah CHRISTELLA Hidden, PT 08/10/2024, 6:45 PM

## 2024-08-12 ENCOUNTER — Encounter: Payer: Self-pay | Admitting: Physical Therapy

## 2024-08-12 ENCOUNTER — Ambulatory Visit: Admitting: Physical Therapy

## 2024-08-12 ENCOUNTER — Other Ambulatory Visit: Payer: Self-pay | Admitting: Physician Assistant

## 2024-08-12 DIAGNOSIS — R2681 Unsteadiness on feet: Secondary | ICD-10-CM

## 2024-08-12 DIAGNOSIS — M5416 Radiculopathy, lumbar region: Secondary | ICD-10-CM

## 2024-08-12 DIAGNOSIS — R2689 Other abnormalities of gait and mobility: Secondary | ICD-10-CM

## 2024-08-12 DIAGNOSIS — M6281 Muscle weakness (generalized): Secondary | ICD-10-CM

## 2024-08-12 DIAGNOSIS — R252 Cramp and spasm: Secondary | ICD-10-CM

## 2024-08-12 DIAGNOSIS — M25551 Pain in right hip: Secondary | ICD-10-CM

## 2024-08-12 NOTE — Therapy (Signed)
 OUTPATIENT PHYSICAL THERAPY TREATMENT       Patient Name: Shakeia Krus MRN: [id number] DOB:1941-02-02, 83 y.o., female Today's Date: 08/12/2024  END OF SESSION:  PT End of Session - 08/12/24 1456     Visit Number 17    Date for Recertification  08/31/24    Authorization Type [employer]    Authorization Time Period 07/07/24 - 08/31/24    Authorization - Visit Number 7    Authorization - Number of Visits 8    Progress Note Due on Visit 24   MD PN for 07/28/24 on visit #14 (07/27/24)   PT Start Time 1446    PT Stop Time 1540    PT Time Calculation (min) 54 min    Activity Tolerance Patient tolerated treatment well    Behavior During Therapy [facility] for tasks assessed/performed            Past Medical History:  Diagnosis Date   High cholesterol    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   She had ACUTE TAKOTSUBO (STRESS) CARDIOMYOPATHY with elevated Troponin Levels - this would be considered Demand Ischemia - Demand Infarction & NOT associated with ACS/CAD.     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  - Cath with NO CAD. ==> RESOLVED: f/u Echo 03/2022: EF 60-65%.   Past Surgical History:  Procedure Laterality Date   APPENDECTOMY     81-20 yo   BACK SURGERY     Age 83   CESAREAN SECTION     ECTOPIC PREGNANCY SURGERY     LAPAROSCOPIC HYSTERECTOMY     LEFT HEART CATH AND CORONARY ANGIOGRAPHY N/A 01/09/2022   Procedure: LEFT HEART CATH AND CORONARY ANGIOGRAPHY;  Surgeon: Wonda Sharper, MD;  Location: [facility] INVASIVE CV LAB;  Service: CV:: Widely patent coronaries with mild nonobstructive LAD plaquing.  Right dominant system.  Normal LVEDP.  Based on clinical presentation,  findings are consistent with acute Takotsubo Cardiomyopathy Syndrome   POSTERIOR FUSION LUMBAR SPINE  05/24/2022   [facility], [place]; [name], MD): L2-L3 XLIF, L2-L3 POSTERIOR DECOMPRESSION AND FUSION   TRANSTHORACIC ECHOCARDIOGRAM  01/08/2022   Severely decreased LV function-EF 25 to 30%.  Mid to apical (mostly anterior) with normal basal motion.  GR 2 DD-elevated LAP.  Mildly dilated LA.  Aortic sclerosis with no stenosis.  No AI.  Normal MV with mild to moderate TR.  Mildly elevated RAP, and PAP (estimated 49 mg). If LAD CAD ruled out - consistent with Takotsubo CM Syndrome.   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) 12/30/2023   New onset atrial fibrillation (HCC) 12/30/2023   Myopia of both eyes with astigmatism and presbyopia 05/06/2023   Dry eye syndrome of both eyes 05/06/2023   Mild tricuspid regurgitation 05/06/2022   Mild mitral regurgitation 05/06/2022   Hypertension 05/06/2022   History of  seizure 05/06/2022   Takotsubo cardiomyopathy-resolved 01/10/2022   Hyperlipidemia with target LDL less than 100 01/10/2022   Hypothyroid 01/10/2022   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) 01/08/2022   Numbness in both legs 04/10/2021   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   Posterior vitreous detachment of both eyes 02/05/2018   Ventricular premature beats 10/31/2015   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   Pure hypercholesterolemia 06/28/2014   Plantar fasciitis 06/28/2014    PCP: Windy Coy, MD   REFERRING PROVIDER: Vernetta Lonni GRADE, MD  REFERRING DIAG:   [phone] (ICD-10-CM) - Pain in right hip  M54.50 (ICD-10-CM) - Low back pain, unspecified  Eval and treat low back/right pelvis and right hip/ishium Any modalities per therapist   THERAPY DIAG:  Radiculopathy, lumbar region  Pain in right hip  Muscle weakness (generalized)  Cramp and spasm  Other abnormalities of gait and mobility  Unsteadiness on feet  RATIONALE FOR EVALUATION AND TREATMENT: Rehabilitation  ONSET DATE: Memorial Day weekend  NEXT MD VISIT:  To be scheduled 81-10 days s/p lumbar MRI    SUBJECTIVE:                                                                                                                                                                                                         SUBJECTIVE STATEMENT: Pt reports her pain is not as bad today as it was earlier this week but still continues to fluctuate.  She inquired about possibility of trying TPDN again today.  She reports she is still trying to coordinate scheduling of her lumbar MRI, and states her daughter, Landry (MD), is trying to help her with this.  EVAL: Maclaine is well know to me from several prior PT episodes for foot drop, cervical and lumbar radiculopathy.  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  Woke up the next morning with severe R hip/buttock pain.  Pain mostly localized to sciatic and ischial area of her R buttock.  PCP provider told her it was likely hip bursitis but ortho MD feels like it is more likely radicular from her back.  Some relief from steroid injection around her right hip ischial area provided by ortho MD but pain has never fully gone away.   She does have a significant lurch in her gait which is somewhat more pronounced than on prior PT episodes with this PT.  PAIN: Are you having pain? Yes: NPRS scale: 2/10 at rest/sitting; 3/10  with standing or walking  Pain location: R lower buttock   Pain description: burning, numb pain  PERTINENT HISTORY:  L2-3 XLIF/PSF 05/24/22; C5-7 ACDF 09/21/20; idiopathic progressive neuropathy; hypthyroidism; DDD; remote h/o back surgery at age 39; h/o scoliosis; Takotsubo cardiomyopathy 01/10/22; CAD; paroxysmal afib; mitral and tricuspid regurgitation; myalgia due to statin; recent cataract surgery  PRECAUTIONS: Fall  RED FLAGS: Bowel or bladder incontinence: Yes: chronic bladder issues, bowel urgency - somewhat worse than before   WEIGHT BEARING RESTRICTIONS: No  FALLS:  Has patient fallen in last 6 months? No  LIVING ENVIRONMENT: Lives with: lives with their spouse Lives in: House/apartment Stairs: Yes: External: 5-6 steps; on left going up Has following equipment at home: Walker - 2 wheeled, shower chair, and hiking/walking poles  OCCUPATION: Retired runner, broadcasting/film/video  PLOF: Independent and Leisure: walking ~30 minutes 1x/day (3x around the court near where she lives); sewing/quilting; likes working in the yard    PATIENT GOALS: To start walking again w/o doing more damage.   OBJECTIVE: (objective measures completed at initial evaluation unless otherwise dated)  DIAGNOSTIC FINDINGS:  04/21/24 - XR RIGHT HIP: An AP pelvis and lateral of the right hip shows well-maintained joint space with no cortical irregularities around the hip or the trochanteric area of the hip.  The lower lumbar spine shows severe degenerative changes at L4-L5 and L5-S1 on the AP view.   08/27/22 - LUMBAR SPINE - COMPLETE 4+ VIEW FINDINGS: There is mild levoconvex curvature of the lumbar spine, unchanged. Alignment is anatomic. There are bilateral transpedicular screws in posterior fusion rods present at L2-L3, new from prior. Disc spacers present. Alignment is anatomic. No fractures are identified. There is moderate severe disc space narrowing at L4-L5 and L5-S1 as well as L1-L2 similar to the prior study. There are atherosclerotic calcifications of the aorta.    IMPRESSION: 1. Status post posterior fusion at L2-L3. Alignment is anatomic. 2. Multilevel degenerative disc disease, similar to prior study.  PATIENT SURVEYS:  Modified Oswestry:  MODIFIED OSWESTRY DISABILITY SCALE Date:  05/11/2024 07/06/24  Pain intensity 2 =  Pain medication provides me with complete relief from pain. 2  2. Personal care (washing, dressing, etc.) 1 =  I can take care of myself normally, but it increases my pain. 2  3. Lifting 4 = I can lift only very light weights 4  4. Walking 1 = Pain prevents me from walking more than 1 mile. 3  5. Sitting 1 =  I can only sit in my favorite chair as long as I like. 1  6. Standing 2 =  Pain prevents me from standing more than 1 hour 3  7. Sleeping 2 =  Even when I take pain medication, I sleep less than 6 hours 1  8. Social Life 2 = Pain prevents me from participating in more energetic activities (eg. sports, dancing). 3  9. Traveling 2 =  My pain restricts my travel over 2 hours. 3  10. Employment/ Homemaking 2 = I can perform most of my homemaking/job duties, but pain prevents me from performing more physically stressful activities (eg, lifting, vacuuming). 2  Total 17 / 50 = 34.0 % 24 / 50 = 48.0 %  Interpretation Moderate disability Severe disability   Interpretation of scores: Score Category Description  0-20% Minimal Disability The patient can cope with most living activities. Usually no treatment is indicated apart from advice on lifting, sitting and exercise  21-40% Moderate Disability The patient experiences more pain and difficulty with sitting,  lifting and standing. Travel and social life are more difficult and they may be disabled from work. Personal care, sexual activity and sleeping are not grossly affected, and the patient can usually be managed by conservative means  41-60% Severe Disability Pain remains the main problem in this group, but activities of daily living are affected. These patients require a detailed  investigation  61-80% Crippled Back pain impinges on all aspects of the patient's life. Positive intervention is required  81-100% Bed-bound These patients are either bed-bound or exaggerating their symptoms  Bluford FORBES Zoe DELENA Karon DELENA, et al. Surgery versus conservative management of stable thoracolumbar fracture: the PRESTO feasibility RCT. [place] ([place]): [employer]; 2021 Nov. [facility] Technology Assessment, No. 25.62.) Appendix 3, Oswestry Disability Index category descriptors. Available from: Findjewelers.cz Minimally Clinically Important Difference (MCID) = 12.8%  LEFS  Extreme difficulty/unable (0), Quite a bit of difficulty (1), Moderate difficulty (2), Little difficulty (3), No difficulty (4) Survey date:  05/11/24 07/06/24   Any of your usual work, housework or school activities 1 2  2. Usual hobbies, recreational or sporting activities 2 2  3. Getting into/out of the bath 2 2  4. Walking between rooms 2 4  5. Putting on socks/shoes 2 4  6. Squatting  0 2  7. Lifting an object, like a bag of groceries from the floor 2 3  8. Performing light activities around your home 2 2  9. Performing heavy activities around your home 0 1  10. Getting into/out of a car 2 1  11. Walking 2 blocks 0 1  12. Walking 1 mile 2 0  13. Going up/down 10 stairs (1 flight) 2 3  14. Standing for 1 hour 2 1  15.  sitting for 1 hour 3 4  16. Running on even ground 0 0  17. Running on uneven ground 0 0  18. Making sharp turns while running fast 0 0  19. Hopping  0 0  20. Rolling over in bed 2 3  Score total:   26 / 80 = 32.5 % 35 / 80 = 43.8 %  Functional limitation: Moderate Moderate     SCREENING FOR RED FLAGS: Bowel or bladder incontinence: Yes: chronic bladder issues, bowel urgency - somewhat worse than before Spinal tumors: No Cauda equina syndrome: No Compression fracture: No Abdominal aneurysm: No  COGNITION:  Overall cognitive status: Within  functional limits for tasks assessed    SENSATION: WFL Except B distal LE neuropathy  POSTURE:  rounded shoulders, forward head, right pelvic obliquity, and scoliosis  PALPATION: Increased muscle tension in L>R lumbar paraspinals and R>L glutes and piriformis with TTP over inferior glutes and piriformis near hamstring origin  LUMBAR ROM: *Lumbar ROM assessed with UE support for balance and close SBA/CGA of PT  Active  Eval  Flexion Hands to distal shins  Extension 50% limited  Right lateral flexion Hand to upper thigh - p!  Left lateral flexion Hand to lateral knee  Right rotation 50% limited  Left rotation 30% limited - p!  (Blank rows = not tested)  MUSCLE LENGTH: Hamstrings: mild/mod tight B ITB: mild tight B Piriformis: mod/severe tight B Hip IR: mod tight B Hip flexors: mild tight R Quads: mild tight R>L Heelcord: mild/mod tight B  LOWER EXTREMITY ROM:    Grossly WFL other than limitations indicated above due to muscle tightness   LOWER EXTREMITY MMT:    MMT Right eval Left eval R 07/06/24 L 07/06/24  Hip flexion 3+  4- 4- 4  Hip extension 4- 4 4 4   Hip abduction 3+ 4- 4 4  Hip adduction 3+ 4- 4- 4  Hip internal rotation 4- 4- 4+ 4  Hip external rotation 3+ 3+ 4 4-  Knee flexion 4 4 4+ 4+  Knee extension 4 4 4+ 4+  Ankle dorsiflexion 3- 3- 3+ 3-  Ankle plantarflexion      Ankle inversion      Ankle eversion       (Blank rows = not tested)  FUNCTIONAL TESTS: (Assessment completed 05/14/24 unless otherwise indicated) 5 times sit to stand: 12.37 sec w/o UE assist - 06/01/24 Timed up and go (TUG): 13.75 sec with single hiking pole 10 meter walk test: 16.00 sec with single hiking pole Gait speed: 2.05 ft/sec with single hiking pole Berg Balance Scale: 42/56; 37-45 significant (>80%) - 05/19/24 Functional gait assessment: 10/30, < 19 = high risk fall    Standardized testing results as of discharge from latest PT episode (11/11/23): 5xSTS = 11.84 sec  =  13.63 sec w/o AD Gait speed = 2.41 ft/sec w/o AD Berg = 45/56; scores of 37-45 indicate significant (>80%) fall risk  FGA = 19/30; scores of 19-24 indicate a medium risk for falls   GAIT: Distance walked: Clinic distances Assistive device utilized: Single walking/trekking pole on R Level of assistance: SBA Gait pattern: Increased sway, step through pattern, decreased stride length, trendelenburg, lateral hip instability, poor foot clearance- Right, and poor foot clearance- Left Comments: B foot slap   TODAY'S TREATMENT:    08/12/2024 THERAPEUTIC EXERCISE: To improve strength, endurance, ROM, and flexibility.  Demonstration, verbal and tactile cues throughout for technique.  Rec Bike - L4 x 6'  Supine R SKTC stretch x 30 Hooklying R KTOS piriformis stretch x 30 Hooklying R figure-4 piriformis stretch x 30 - patient reporting much better tolerance for this stretch after TPDN today than in recent attempts Hooklying R sciatic nerve glide 2 x 10  TRIGGER POINT DRY NEEDLING: Treatment instructions/education: Subsequent Treatment: Instructions provided previously at initial dry needling treatment.  Instructions reviewed, if requested by the patient, prior to subsequent dry needling treatment.  Education Handout Provided: Previously Provided Consent: Patient Verbal Consent Given: Yes Treatment: Muscles Treated: R glute medius and piriformis Utilized skilled palpation to identify bony landmarks and trigger points along with monitoring of soft tissue during DN. Electrical Stimulation Performed: No Treatment Response/Outcome: Twitch response elicited, Palpable increase in muscle length, Decreased tissue resistance noted, Decreased pain/TTP, and Improved exercise tolerance  MANUAL THERAPY: To promote normalized muscle tension, improved flexibility, improved joint mobility, increased ROM, pain modulation, and reduced pain. STM/DTM, manual TPR and pin & stretch to muscles addressed with DN    SELF CARE: Provided education for HEP review including recommended frequency for home performance.  Verbally reviewed and consolidated remainder of current HEP, removing some of the more triggering exercises and clarifying recommended frequency for ongoing performance of HEP, emphasizing avoiding pushing any exercises or stretches into painful range and avoiding trying to push exercises past the point of fatigue.  Discussed recent increasing variability in pain and limited tolerance for PT as well as status in current POC with recertification due by 08/31/2024 however only 1 more [facility] Medicare visit currently authorized.  Explained that limited progress may indicate a need for transition to HEP pending MRI and follow-up consultations with orthopedic and neurosurgery, with full determination of plan TBD pending goal assessment and assessment response to TPDN.   08/10/2024  THERAPEUTIC  EXERCISE: To improve strength, endurance, ROM, and flexibility.  Demonstration, verbal and tactile cues throughout for technique.  Rec Bike - L4 x 6' Seated 3-way lumbar flexion stretch with UE support on hiking pole 3 x 30 - center and L stretch triggering R anterolateral thigh pain, no radicular pain with R-sided stretch Standing lateral lean ITB/QL stretch holding onto wall ladder 2 x 30, 2nd rep adding slight trunk rotation toward ladder Standing lumbar flexion stretch holding onto wall ladder 2 x 30 leaning straight back 2 x 30 with slight shift of hips to R  THERAPEUTIC ACTIVITIES: To improve functional performance.  Demonstration, verbal and tactile cues throughout for technique. Standing R hip extension diagonal with L foot on 2 block x 5 - mod R hip pain - discontinued d/t increased pain Standing L hip extension diagonal with R foot on 2 block x 5 - mod/severe increased R hip pain with more limited ROM and increased difficulty maintaining level pelvis - discontinued Mini squat x 5 - discontinued d/t  increased pain Supported squat (re-attempted after stretches w/o increased pain) x 10  MANUAL THERAPY: To promote improved flexibility, pain modulation, and reduced pain utilizing manual TP therapy.  Attempted self-STM/release to R glutes with lacrosse ball on wall but pt reporting increased pain and not the normal release/relief that she typically experiences with this.   07/29/2024  THERAPEUTIC EXERCISE: To improve strength, endurance, and flexibility.   Rec Bike - L4 x 6'  SELF CARE:   Answered patient's questions and provided education regarding what MRI can determine, explaining difference between spinal canal stenosis and foraminal stenosis which are both present on her past MRIs and how her current symptoms correlate.  NEUROMUSCULAR RE-EDUCATION: To improve balance, coordination, kinesthesia, posture, proprioception, and reduce fall risk. Corner balance progression with narrow BOS on firm surface with arms crossed on chest            Eyes open: static stance 3 x 30 sec horiz head turns x 5 vertical head nods x 5 Pt noted to center majority of weight on L LE   THERAPEUTIC ACTIVITIES: To improve functional performance.  Demonstration, verbal and tactile cues throughout for technique. Standing R hip extension diagonal with L foot on 2 block x 10 - mild to no increased R hip pain Standing L hip extension diagonal with R foot on 2 block x 10 - mod increased R hip pain with more limited ROM and increased difficulty maintaining level pelvis Standing L hip hike with R foot on 2 block x 5 - limited by increased pain   07/27/2024  THERAPEUTIC EXERCISE: To improve strength and endurance.   Rec Bike - L4 x 4', L3 x 2'  PHYSICAL PERFORMANCE TEST or MEASUREMENT: Berg Balance Test  Sit to Stand Able to stand without using hands and stabilize independently   Standing Unsupported Able to stand safely 2 minutes   Sitting with Back Unsupported but Feet Supported on Floor or Stool Able to  sit safely and securely 2 minutes   Stand to Sit Sits safely with minimal use of hands   Transfers Able to transfer safely, minor use of hands   Standing Unsupported with Eyes Closed Able to stand 3 seconds   Standing Unsupported with Feet Together Able to place feet together independently but unable to hold for 30 seconds   From Standing, Reach Forward with Outstretched Arm Can reach forward >12 cm safely (5)   From Standing Position, Pick up Object from Floor Able to  pick up shoe safely and easily   From Standing Position, Turn to Look Behind Over each Shoulder Turn sideways only but maintains balance   Turn 360 Degrees Able to turn 360 degrees safely one side only in 4 seconds or less   Standing Unsupported, Alternately Place Feet on Step/Stool Able to stand independently and complete 8 steps >20 seconds   Standing Unsupported, One Foot in [employer] balance while stepping or standing   Standing on One Leg Tries to lift leg/unable to hold 3 seconds but remains standing independently   Total Score 40   Berg comment: 37-45 = Significant (>80%) fall risk       THERAPEUTIC ACTIVITIES: To improve functional performance.  Demonstration, verbal and tactile cues throughout for technique.  = 13.56 sec with single trekking pole, 13.10 sec w/o AD Gait speed = 2.42 ft/sec with single trekking pole, 2.50 ft/sec w/o AD Goal assessment   07/15/2024 THERAPEUTIC EXERCISE: To improve strength and endurance.    Rec Bike - L4 x 6 min  THERAPEUTIC ACTIVITIES: To improve functional performance.  Demonstration, verbal and tactile cues throughout for technique. Standing R hip hike with L foot on 2 block x 10 Standing L hip hike with R foot on 2 block x 5 - limited by increased pain Standing R hip extension diagonal with L foot on 2 block x 10 - mild increased R hip pain Standing L hip extension diagonal with R foot on 2 block x 10 - mod increased R hip pain with more limited ROM Standing R hip  extension diagonal with L foot on 2 block and torso leaning on pillow + white wedge on elevated mat table x 10 - less pain noted Standing L hip extension diagonal with R foot on 2 block and torso leaning on pillow + white wedge on elevated mat table x 10 - less pain noted  Eccentric squat touch to elevated mat table with UE support on back of chair x 10 Lateral step & lift over cone x 10 bil (simulating stepping over edge of tub) - mild increased pain in R buttock with L step-over   07/13/2024  THERAPEUTIC EXERCISE: To improve strength, endurance, ROM, and flexibility.  Demonstration, verbal and tactile cues throughout for technique.  Rec Bike - L4 x 4 min, L3 x 2 min Manual R hip flexor stretch x 30 Manual R TFL stretch x 30 Manual R ITB stretch x 30 S/L RTB clam x 10 bil Bridge + RTB hip ABD isometric - deferred due to increased R buttock and LE radicular pain Bridge + manual PT resisted hip ABD isometric - deferred due to increased R buttock and LE radicular pain Bridge + hip ADD ball squeeze isometric - deferred due to increased R buttock and LE radicular pain  MANUAL THERAPY: To promote movement utilizing connective tissue massage, therapeutic massage, manual TP therapy, and myofascial release. STM/DTM, manual TPR to R distal glutes, piriformis, TFL, and ITB  THERAPEUTIC ACTIVITIES: To improve functional performance.  Demonstration, verbal and tactile cues throughout for technique. Standing hip extension diagonal with looped RTB at distal thighs x 8-10 bil (limited due to fatigue) - added to HEP without RTB Standing L/R hip hike with R foot on 2 block 2 x 10 Standing L/R hip circumduction with opp foot standing on 2 block - focusing on maintaining level pelvis x ~10 each side   07/08/2024  THERAPEUTIC EXERCISE: To improve strength, endurance, ROM, and flexibility.  Demonstration, verbal and tactile cues  throughout for technique.  Rec Bike - L4 x 3 min, L3 x 3 min - resistance  level reduced due to increasing fatigue Supine manual stretches: R hamstring stretch x 30 R mod Thomas quad/hip flexor stretch x 30 R glute cross body stretch x 30 R ITB cross body stretch x 30 R KTOS piriformis stretch x 30 R figure-4 to chest piriformis stretch x 30  NEUROMUSCULAR RE-EDUCATION: To improve coordination, kinesthesia, and proprioception.  Hooklying R sciatic nerve glide x 10 - patient reporting feeling of less motion at toes during nerve glide Supine/long sitting R isometric toe curls into therapist's hand x 10 Attempted long sitting R toe curls into YTB however patient unable to create AROM against resistance band  MANUAL THERAPY: To promote normalized muscle tension, improved flexibility, and reduced pain utilizing connective tissue massage, therapeutic massage, manual TP therapy, and myofascial release.  STM/DTM, manual TPR, and percussive massage with massage gun to R distal glutes, piriformis, TFL, distal iliopsoas, vastus lateralis and ITB  MODALITIES: (non-billable) Moist heat pack to R glutes x 10' at end of session   07/06/2024 - Recert THERAPEUTIC EXERCISE: To improve strength and endurance.   Rec Bike - L4 x 3 min, L3 x 3 min - resistance level reduced due to increasing fatigue  THERAPEUTIC ACTIVITIES: To improve functional performance.  Demonstration, verbal and tactile cues throughout for technique.  LE MMT - see above LE MMT table LEFS: 35/80 = 43.8% indicating moderate functional limitation Modified Oswestry: 24/50 = 48% or severe disability Gait speed = 2.08 ft/sec with single hiking pole Goal assessment  PHYSICAL PERFORMANCE TEST or MEASUREMENT: Functional Gait  Assessment  Gait Level Surface Walks 20 ft, slow speed, abnormal gait pattern, evidence for imbalance or deviates 10-15 in outside of the 12 in walkway width. Requires more than 7 sec to ambulate 20 ft.   Change in Gait Speed Makes only minor adjustments to walking speed, or  accomplishes a change in speed with significant gait deviations, deviates 10-15 in outside the 12 in walkway width, or changes speed but loses balance but is able to recover and continue walking.   Gait with Horizontal Head Turns Performs head turns with moderate changes in gait velocity, slows down, deviates 10-15 in outside 12 in walkway width but recovers, can continue to walk.   Gait with Vertical Head Turns Performs task with slight change in gait velocity (eg, minor disruption to smooth gait path), deviates 6 - 10 in outside 12 in walkway width or uses assistive device   Gait and Pivot Turn Pivot turns safely in greater than 3 sec and stops with no loss of balance, or pivot turns safely within 3 sec and stops with mild imbalance, requires small steps to catch balance.   Step Over Obstacle Is able to step over one shoe box (4.5 in total height) without changing gait speed. No evidence of imbalance.   Gait with Narrow Base of Support Ambulates less than 4 steps heel to toe or cannot perform without assistance.   Gait with Eyes Closed Cannot walk 20 ft without assistance, severe gait deviations or imbalance, deviates greater than 15 in outside 12 in walkway width or will not attempt task.   Ambulating Backwards Walks 20 ft, slow speed, abnormal gait pattern, evidence for imbalance, deviates 10-15 in outside 12 in walkway width.   Steps Alternating feet, must use rail.   Total Score 12   FGA comment: < 19 = high risk fall  07/01/2024  THERAPEUTIC EXERCISE: To improve strength, endurance, ROM, and flexibility.  Demonstration, verbal and tactile cues throughout for technique.  Rec Bike - L3 x 6 min Seated 3-way thoracolumbar flexion stretch 2 x 30 each position, using hiking pole for UE support Supine R sciatic nerve glides 2 x 10 bil Hooklying R figure-4 piriformis stretch x 30 Hooklying R KTOS piriformis stretch x 30 - pt noting increased groin anterior thigh discomfort Mod thomas R  quad/hip flexor stretch 2 x 30 - 1 rep each w/o and with strap Supine R HS stretch with strap x 30 Supine R ITB stretch with strap x 30   MANUAL THERAPY: To promote normalized muscle tension, improved flexibility, improved joint mobility, and reduced pain utilizing connective tissue massage, therapeutic massage, manual TP therapy, and myofascial release.  STM/DTM and manual TPR to R lower glutes and piriformis   SELF CARE:  Provided education on self-STM/muscle release options and need to break up her travel to [place] with frequent stops to allow her to stretch and walk around.   MODALITIES: (non-billable) Moist heat pack to R glutes x 10' at end of session   06/24/2024 THERAPEUTIC EXERCISE: To improve strength, endurance, ROM, and flexibility.  Demonstration, verbal and tactile cues throughout for technique.  Rec Bike - L3 x 6 min Seated 3-way thoracolumbar flexion stretch 2 x 30 each position, using hiking pole for UE support, clarifying angle for lateral stretches Seated B foot/ankle arch raises x 10 Seated toe curls/towel scrunches - patient with difficulty achieving grip of towel with toes therefore suggested patient try using a small object like a pen or pencil to work on grip with toes at home  NEUROMUSCULAR RE-EDUCATION: To improve coordination, kinesthesia, and reduce nerve irritation. Seated sciatic nerve glides - difficulty coordinating movement  Supine sciatic nerve glides 2 x 10 bil Supine sciatic nerve glides with 90/90 knee flexion x 10 bil - patient reporting better response to previous exercise  THERAPEUTIC ACTIVITIES: To improve functional performance.  Demonstration, verbal and tactile cues throughout for technique. Unsupported mini-squat hovering over mat table with arms forward for balance (chair placed in front for safety with intermittent support) x 10 - attempted in bare feet today as patient feeling like she is losing proprioceptive awareness and control  in her feet, however still demonstrating frequent loss of eccentric control/uncontrolled descent onto mat table   06/22/2024 THERAPEUTIC EXERCISE: To improve strength, endurance, and flexibility.  Demonstration, verbal and tactile cues throughout for technique.  Rec Bike - L3 x 6 min Seated 3-way thoracolumbar flexion stretch 2 x 30 each position using hiking pole for UE support  THERAPEUTIC ACTIVITIES: To improve functional performance.  Demonstration, verbal and tactile cues throughout for technique. Unsupported mini-squat hovering over mat table with arms forward for balance (chair placed in front for safety with intermittent support) x 10 - more frequent loss of eccentric control/uncontrolled descent onto mat table Unsupported mini-squat with glute touch touch to rounded bolster on mat table with arms forward for balance (chair placed in front for safety with intermittent support) x 10  NEUROMUSCULAR RE-EDUCATION: To improve balance, coordination, kinesthesia, posture, proprioception, and amplitude of movement.  S/L R hip ABD/ext diagonals 2 x 5 - L knee flexed for increased ant/post stability to prevent pt from rolling backward & PT guiding motion for 1st few reps; repeated x 10 with L LE  S/L R hip circumduction arc over L LE tapping R foot in front and behind L foot x 5 -  very limited eccentric control with posterior tap; repeated x 10 with L LE   06/08/2024 THERAPEUTIC EXERCISE: To improve strength, endurance, ROM, and flexibility.  Demonstration, verbal and tactile cues throughout for technique. Rec Bike - L3 x 6 min Seated 3-way thoracolumbar flexion stretch 2 x 30 each position - 1st reps with hands on green Pball, 2nd reps using hiking pole for UE support Quadruped 3-way child's pose x 30 each  THERAPEUTIC ACTIVITIES: To improve functional performance.  Demonstration, verbal and tactile cues throughout for technique. Reviewed proper lifting mechanics using squatting motion and  golfer's lift  Unsupported mini-squat hovering over mat table with arms forward for balance (chair placed in front for safety but not needed for support) x 8  SELF CARE:  Provided education on managing muscle cramping through stretching, increased hydration, and potential Mg supplement (instructed pt to verify with PCP that there are no concerns or potential medicine interactions with trying Mg supplement).   06/03/2024  THERAPEUTIC EXERCISE: To improve strength, endurance, and flexibility.  Demonstration, verbal and tactile cues throughout for technique.  Rec Bike - L3 x 6 min Standing lateral lean R ITB over back of chair 2 x 30 Supine ITB stretch with strap 3 x 30 bil  MANUAL THERAPY: To promote normalized muscle tension, improved flexibility, and reduced pain utilizing connective tissue massage, therapeutic massage, manual TP therapy, and myofascial release.  STM/DTM, IASTM with foam roller and manual TPR to R distal glutes, HS and ITB  NEUROMUSCULAR RE-EDUCATION: To improve coordination, kinesthesia, posture, proprioception, and amplitude of movement.  S/L R hip ABD/ext diagonals x 10 - slight PT assist initially to stabilize hips to prevent anterior/posterior rotation & VC/TC to avoid hip ER S/L R hip circumduction arc over L LE tapping R foot in front and behind L foot x 5 - very limited eccentric control with posterior tap   SELF CARE:  HEP update Provided instruction in self-STM techniques to R ITB using rolling pin.    06/01/2024 THERAPEUTIC EXERCISE: To improve strength and endurance.  Demonstration, verbal and tactile cues throughout for technique. NuStep - L4 x 6 min (LE only)  THERAPEUTIC ACTIVITIES: To improve functional performance.  Demonstration, verbal and tactile cues throughout for technique. 5xSTS = 12.37 sec w/o UE assist, somewhat uncontrolled descent esp on last rep  TRIGGER POINT DRY NEEDLING: Treatment instructions/education: Initial Treatment: Pt  instructed on Dry Needling rational, procedures, and possible side effects. Pt instructed to expect mild to moderate muscle soreness later in the day and/or into the next day.  Pt instructed in methods to reduce muscle soreness. Pt instructed to continue prescribed HEP. Patient was educated on signs and symptoms of infection and other risk factors and advised to seek medical attention should they occur.  Patient verbalized understanding of these instructions and education.  Education Handout Provided: Yes Consent: Patient Verbal Consent Given: Yes Treatment: Muscles Treated: R medial and lateral piriformis, R glute medius Utilized skilled palpation to identify bony landmarks and trigger points along with monitoring of soft tissue during DN. Electrical Stimulation Performed: No Treatment Response/Outcome: Twitch response elicited, Palpable increase in muscle length, Decreased tissue resistance noted, Decreased pain/TTP, and Improved exercise tolerance  MANUAL THERAPY: To promote normalized muscle tension, improved flexibility, and reduced pain. STM/DTM, manual TPR and pin & stretch to muscles addressed with DN   NEUROMUSCULAR RE-EDUCATION: To improve balance, coordination, kinesthesia, posture, proprioception, and amplitude of movement. Mini-squat with counter support x 10   05/19/24 THERAPEUTIC EXERCISE: To improve ROM and  flexibility.  Demonstration, verbal and tactile cues throughout for technique.  Bike L3x68min Standing runner stretch x 30 BLE Standing soleus stretch x 30 BLE Table piriformis stretch 2x30 RLE Supine L figure 4 stretch 2x30 LLE Supine glute stretch 2x30 BLE Supine KTOS stretch 2x30 BLE  Seated clams with GTB + trA x 10 BLE Seated marching GTB x + TrA 10 BLE Berg Balance Test  Sit to Stand Able to stand without using hands and stabilize independently   Standing Unsupported Able to stand 2 minutes with supervision   Sitting with Back Unsupported but Feet  Supported on Floor or Stool Able to sit safely and securely 2 minutes   Stand to Sit Sits safely with minimal use of hands   Transfers Able to transfer safely, minor use of hands   Standing Unsupported with Eyes Closed Able to stand 10 seconds with supervision   Standing Unsupported with Feet Together Able to place feet together independently and stand 1 minute safely   From Standing, Reach Forward with Outstretched Arm Can reach confidently >25 cm (10)   From Standing Position, Pick up Object from Floor Able to pick up shoe safely and easily   From Standing Position, Turn to Look Behind Over each Shoulder Looks behind one side only/other side shows less weight shift   Turn 360 Degrees Able to turn 360 degrees safely but slowly   Standing Unsupported, Alternately Place Feet on Step/Stool Able to complete 4 steps without aid or supervision   Standing Unsupported, One Foot in Front Needs help to step but can hold 15 seconds   Standing on One Leg Unable to try or needs assist to prevent fall   Total Score 42       05/14/2024  THERAPEUTIC ACTIVITIES: To improve functional performance.  Demonstration, verbal and tactile cues throughout for technique.  5xSTS: Deferred due to increased pain with uncontrolled descent upon sitting TUG: 13.75 sec with single hiking pole : 16.00 sec with single hiking pole Gait speed: 2.05 ft/sec with single hiking pole  PHYSICAL PERFORMANCE TEST or MEASUREMENT: Functional Gait  Assessment  Gait Level Surface Walks 20 ft, slow speed, abnormal gait pattern, evidence for imbalance or deviates 10-15 in outside of the 12 in walkway width. Requires more than 7 sec to ambulate 20 ft.   Change in Gait Speed Makes only minor adjustments to walking speed, or accomplishes a change in speed with significant gait deviations, deviates 10-15 in outside the 12 in walkway width, or changes speed but loses balance but is able to recover and continue walking.   Gait with  Horizontal Head Turns Performs head turns with moderate changes in gait velocity, slows down, deviates 10-15 in outside 12 in walkway width but recovers, can continue to walk.   Gait with Vertical Head Turns Performs task with moderate change in gait velocity, slows down, deviates 10-15 in outside 12 in walkway width but recovers, can continue to walk.   Gait and Pivot Turn Turns slowly, requires verbal cueing, or requires several small steps to catch balance following turn and stop   Step Over Obstacle Is able to step over one shoe box (4.5 in total height) without changing gait speed. No evidence of imbalance.   Gait with Narrow Base of Support Ambulates less than 4 steps heel to toe or cannot perform without assistance.   Gait with Eyes Closed Cannot walk 20 ft without assistance, severe gait deviations or imbalance, deviates greater than 15 in outside 12 in walkway width  or will not attempt task.   Ambulating Backwards Walks 20 ft, slow speed, abnormal gait pattern, evidence for imbalance, deviates 10-15 in outside 12 in walkway width.   Steps Alternating feet, must use rail.   Total Score 10   FGA comment: < 19 = high risk fall      THERAPEUTIC EXERCISE: To improve ROM and flexibility.  Demonstration, verbal and tactile cues throughout for technique.  Hooklying R HS stretch with strap hamstring 2 x 30 Supine gluteus stretch 2 x 30 bil Hooklying figure-4 piriformis stretch x 30 on L, deferred d/t increased pain on R Hooklying KTOS piriformis stretch x 30 on L, deferred d/t increased pain on R Side-sitting R modified pigeon piriformis stretch 2 x 30 - good stretch noted without increased pain Seated R KTOS piriformis stretch 2 x 30 - good stretch noted without increased pain  SELF CARE:   Modified HEP to address concerns regarding increased R buttock/hip pain with piriformis stretching, with patient reporting better tolerance for seated piriformis stretching.  Patient cautioned not to  force any painful ROM/stretching and report back to PT next visit if HEP continues to irritate or cause increased pain. Provided instruction in self-STM techniques to R glutes/piriformis using tennis ball against wall or in chair.    05/04/2024  SELF CARE:  Reviewed eval findings and role of PT in addressing identified deficits as well as instruction in initial HEP (see below).    PATIENT EDUCATION:  Education details: continue with current HEP  Person educated: Patient Education method: Explanation Education comprehension: verbalized understanding  HOME EXERCISE PROGRAM: Access Code: [facility] URL: [email or web address] Date: 08/12/2024 Prepared by: Elijah Hidden  Exercises - Standing Soleus Stretch  - 1 x daily - 7 x weekly - 3 sets - 3 reps - 30 sec hold - Hooklying Hamstring Stretch with Strap  - 2 x daily - 7 x weekly - 3 reps - 30 sec hold - Supine Gluteus Stretch  - 2 x daily - 7 x weekly - 3 reps - 30 sec hold - Supine Figure 4 Piriformis Stretch  - 2 x daily - 7 x weekly - 3 reps - 30 sec hold - Supine Piriformis Stretch with Foot on Ground  - 2 x daily - 7 x weekly - 3 reps - 30 sec hold - Seated Table Piriformis Stretch  - 2 x daily - 7 x weekly - 3 reps - 30 sec hold - Seated Piriformis Stretch  - 2 x daily - 7 x weekly - 3 reps - 30 sec hold - Seated March with Resistance  - 1 x daily - 7 x weekly - 2 sets - 10 reps - Seated Isometric Hip Abduction with Resistance  - 1 x daily - 7 x weekly - 2 sets - 10 reps - Glute Max Release With [employer] Against Wall  - 1 x daily - 7 x weekly - 1-2 min hold - Mini Squat with Counter Support  - 1 x daily - 3 x weekly - 2 sets - 10 reps - 3-5 sec hold - Supine Iliotibial Band Stretch with Strap  - 2 x daily - 7 x weekly - 3 reps - 30 sec hold - Sidelying Diagonal Hip Abduction  - 1 x daily - 3 x weekly - 2 sets - 10 reps - 3 sec hold - Seated 3 Way Exercise Ball Roll Out Stretch  - 1 x daily - 7 x weekly - 2 sets - 3  reps -  30 sec hold - Supine Sciatic Nerve Glide  - 2-3 x daily - 7 x weekly - 2 sets - 10 reps - 3 sec hold - Hip Hiking on Step  - 1 x daily - 3 x weekly - 2 sets - 10 reps - 3 sec hold - Diagonal Hip Extension  - 1 x daily - 3 x weekly - 2 sets - 10 reps - 3 sec hold  Patient Education - Lifting Techniques   ASSESSMENT:  CLINICAL IMPRESSION: Jeronica reports reports her pain is not as bad today but still continues to fluctuate without predictable triggers.  She states she and her daughter still feel like her pain may be at least partially muscular in origin and inquired about possibility of benefit from further TPDN.  Palpation revealing significant increased tension and taut bands in R lateral glutes and piriformis.  After review of explanation of TPDN rational, procedures, outcomes and potential side effects, patient verbalized consent to TPDN treatment in conjunction with manual STM/DTM and TPR to reduce ttp/muscle tension. Muscles treated as indicated above. TPDN produced normal response with good twitches elicited resulting in palpable reduction in pain/ttp and muscle tension, as well as improved tolerance for stretches and exercises that previously had become more painful for her. Pt educated to expect mild to moderate muscle soreness for up to 24-48 hrs and instructed to continue prescribed HEP and current activity level with pt verbalizing understanding of these instructions.  We verbally reviewed the remainder of her HEP with PT reinforcing avoidance of forcing painful ROM or exercises that increase pain as well as respecting fatigue and stopping exercises even if shy of a full set.  Pamelyn will benefit from continued skilled PT to address ongoing flexibility, strength and balance deficits deficits to improve mobility and activity tolerance with decreased pain interference and decreased risk for falls.  We discussed that she will reach the end of her current POC this month and only has 1 visit  remaining and her current [facility] authorization.  Will reassess response to TPDN today and if good benefit noted may attempt to extend POC and request further insurance authorization.  If no significant benefit noted and given continued significant fluctuation in symptoms and pending MRI, we will probably place her on hold for PT until further evaluation completed by orthopedic surgeon and neurosurgeon.    EVAL: Chrystine West is a 83 y.o. female who was referred to physical therapy for evaluation and treatment for R hip/buttock pain presumed to be the result of lumbar radiculopathy.  Patient reports onset of R buttock pain beginning Memorial Day weekend 2025.  Pain is worse with walking and positional changes.  Patient has deficits in hip ROM, proximal LE flexibility, B LE strength, abnormal posture, and TTP with abnormal muscle tension in R lower glutes and piriformis which are interfering with ADLs and are impacting quality of life.  On Modified Oswestry patient scored 17/50 demonstrating 34% or moderate disability.  On LEFS patient scored 26/80 demonstrating moderate functional limitation.  Given her significant LE weakness and impaired gait pattern, will plan more formalized balance testing next visit.  [place]y will benefit from skilled PT to address above deficits to improve mobility and activity tolerance with decreased pain interference.     OBJECTIVE IMPAIRMENTS: Abnormal gait, decreased activity tolerance, decreased balance, decreased knowledge of condition, decreased mobility, difficulty walking, decreased ROM, decreased strength, increased fascial restrictions, impaired perceived functional ability, increased muscle spasms, impaired flexibility, impaired sensation, improper body mechanics, postural dysfunction,  and pain.   ACTIVITY LIMITATIONS: carrying, lifting, bending, standing, squatting, sleeping, stairs, transfers, bed mobility, bathing, toileting, dressing, reach over head,  hygiene/grooming, locomotion level, and caring for others  PARTICIPATION LIMITATIONS: meal prep, cleaning, laundry, driving, shopping, community activity, yard work, and church  PERSONAL FACTORS: Age, Past/current experiences, Time since onset of injury/illness/exacerbation, and 3+ comorbidities: L2-3 XLIF/PSF 05/24/22; C5-7 ACDF 09/21/20; idiopathic progressive neuropathy; hypthyroidism; DDD; remote h/o back surgery at age 19; h/o scoliosis; 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   REHAB POTENTIAL: Good  CLINICAL DECISION MAKING: Unstable/unpredictable  EVALUATION COMPLEXITY: High   GOALS: Goals reviewed with patient? Yes  SHORT TERM GOALS: Target date: 06/15/2024  Patient will be independent with initial HEP to improve outcomes and carryover.  Baseline: Initial HEP provided on eval Goal status: MET - 05/19/24  2.  Patient will report 25% improvement in low back pain to improve QOL. Baseline: 2/10 on eval, up to 10/10 06/03/24 - improvement in pain noted by end of today's session Goal status: MET - 06/08/24 - Pt reports 50+% improvement in pain thus far  3.  Complete standardized balance assessment and update LTG's as indicated. Baseline:  05/14/24 - , TUG and FGA completed; Berg and 5xSTS pending  05/19/24 GLENWOOD Levins completed Goal status: MET - 06/01/24 - 5xSTS completed  LONG TERM GOALS: Target date: 07/20/2024, extended to 08/31/2024  Patient will be independent with ongoing/advanced HEP for self-management at home.  Baseline:  06/22/24 - HEP reviewed today 07/06/24 - no concerns with current HEP Goal status: IN PROGRESS - 08/10/24 - Encouraged patient to continue to work on HEP without increased pain as tolerated  2.  Patient will report 50-75% improvement in R hip/pelvic pain to improve QOL.  Baseline: 2/10 on eval, up to  10/10 06/08/24 - Pt reports 50+% improvement in pain thus far 07/06/24 - 50-60% improvement overall, but still having periods of more intense pain 07/27/24 - still at least 50% improvement overall, but pain still fluctuating  Goal status: IN PROGRESS - 08/10/24 - pain still fluctuating, but slightly worse after increased activity over Thanksgiving holiday  3.  Patient will demonstrate improved B proximal LE strength to >/= 4 to 4+/5 for improved stability and ease of mobility. Baseline: Refer to above LE MMT table Goal status: IN PROGRESS - 07/06/24   4.  Patient will report >/= 35/80 on LEFS (MCID = 9 pts) to demonstrate improved functional ability. Baseline: 26 / 80 = 32.5 % Goal status: MET - 07/06/24 - 35 / 80 = 43.8 %  5. Patient will report </= 22% on Modified Oswestry (MCID = 12%) to demonstrate improved functional ability with decreased pain interference. Baseline: 17 / 50 = 34.0 % Goal status: IN PROGRESS - 07/06/24 - 24 / 50 = 48.0 %  6.  Patient will resume her normal walking routine w/o increased pain to allow for  improved mobility and activity tolerance. Baseline: R ischial pain limiting walking tolerance 07/06/24 - attempted to try walking in her neighborhood last week but experienced increased pain while trying to walk up a incline that caused her to have to stop and return home  Goal status: IN PROGRESS - 07/27/24 - patient reports she has been walking ~1/4 of her normal distance staying on level ground and not walking nearly as often as normal (was usually walking daily)  7.  Patient will improve Berg score by at  least 8 points or to >/= 45/56 (baseline as of prior D/C from PT) to improve safety and stability with ADLs in standing and reduce risk for falls.  Baseline: 42/56 (05/19/24) Goal status: IN PROGRESS - 07/27/24 - individual item scores fluctuating but overall score decreased by 2 points to 40/56  8.  Patient will improve FGA score by at least 4 points or to >/=  19/30 (baseline as of prior D/C from PT) to improve gait stability and reduce risk for falls.   Baseline: 10/30 (05/14/24) Goal status: IN PROGRESS - 07/06/24 - 12/30  9.  Patient will improve gait velocity to at least 2.62 ft/sec for improved gait efficiency and safety with community ambulation.  Baseline: 2.05 ft/sec with single hiking pole (05/14/24) 07/06/24 - 2.08 ft/sec with single hiking pole Goal status: IN PROGRESS - 07/27/24 - 07/27/24 - 2.42 ft/sec with single trekking pole, 2.50 ft/sec w/o AD   PLAN:  PT FREQUENCY: 2x/week  PT DURATION: 8 weeks  PLANNED INTERVENTIONS: 97164- PT Re-evaluation, 97750- Physical Performance Testing, 97110-Therapeutic exercises, 97530- Therapeutic activity, 97112- Neuromuscular re-education, 97535- Self Care, 02859- Manual therapy, [id number]- Gait training, [phone]- Electrical stimulation (unattended), 97035- Ultrasound, 02966- Ionotophoresis 4mg /ml Dexamethasone, 79439 (1-2 muscles), 20561 (3+ muscles)- Dry Needling, Patient/Family education, Balance training, Stair training, Taping, Joint mobilization, DME instructions, Cryotherapy, and Moist heat  PLAN FOR NEXT SESSION: Reassess LE MMT and remaining LTG's as able - determine need for recert versus transition to HEP +/- 30-day hold pending outcome of MRI and follow-up with orthopedic and neurosurgeons; potential trial of TENS unit if pain persisting, providing information on home unit if benefit noted  Elijah CHRISTELLA Hidden, PT 08/12/2024, 4:07 PM

## 2024-08-15 LAB — LAB REPORT - SCANNED: EGFR: 70

## 2024-08-17 ENCOUNTER — Ambulatory Visit: Admitting: Physical Therapy

## 2024-08-19 ENCOUNTER — Ambulatory Visit: Admitting: Physical Therapy

## 2024-08-23 ENCOUNTER — Encounter: Payer: Self-pay | Admitting: Orthopaedic Surgery

## 2024-08-24 ENCOUNTER — Encounter: Payer: Self-pay | Admitting: Physical Therapy

## 2024-08-24 ENCOUNTER — Ambulatory Visit: Admitting: Physical Therapy

## 2024-08-24 DIAGNOSIS — R2689 Other abnormalities of gait and mobility: Secondary | ICD-10-CM

## 2024-08-24 DIAGNOSIS — M6281 Muscle weakness (generalized): Secondary | ICD-10-CM

## 2024-08-24 DIAGNOSIS — R2681 Unsteadiness on feet: Secondary | ICD-10-CM

## 2024-08-24 DIAGNOSIS — M5416 Radiculopathy, lumbar region: Secondary | ICD-10-CM

## 2024-08-24 DIAGNOSIS — M25551 Pain in right hip: Secondary | ICD-10-CM

## 2024-08-24 DIAGNOSIS — R252 Cramp and spasm: Secondary | ICD-10-CM

## 2024-08-24 NOTE — Therapy (Signed)
 OUTPATIENT PHYSICAL THERAPY TREATMENT / RECERTIFICATION  Progress Note  Reporting Period 07/27/2024 to 08/24/2024   See note below for Objective Data and Assessment of Progress/Goals.      Patient Name: [name] Trujillo MRN: [id number] DOB:12-11-1940, 83 y.o., female Today's Date: 08/24/2024  END OF SESSION:  PT End of Session - 08/24/24 1447     Visit Number 18    Date for Recertification  10/19/24    Authorization Type [employer]    Authorization Time Period 07/07/24 - 08/31/24    Authorization - Visit Number 8    Authorization - Number of Visits 8    Progress Note Due on Visit 28   Recert/PN on visit #18 (08/24/24)   PT Start Time 1447    PT Stop Time 1530    PT Time Calculation (min) 43 min    Activity Tolerance Patient tolerated treatment well    Behavior During Therapy [facility] for tasks assessed/performed             Past Medical History:  Diagnosis Date   High cholesterol    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   She had ACUTE TAKOTSUBO (STRESS) CARDIOMYOPATHY with elevated Troponin Levels - this would be considered Demand Ischemia - Demand Infarction & NOT associated with ACS/CAD.     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  - Cath with NO CAD. ==> RESOLVED: f/u Echo 03/2022: EF 60-65%.   Past Surgical History:  Procedure Laterality Date   APPENDECTOMY     10-18 yo   BACK SURGERY     Age 21   CESAREAN SECTION     ECTOPIC PREGNANCY SURGERY     LAPAROSCOPIC HYSTERECTOMY     LEFT HEART CATH AND CORONARY ANGIOGRAPHY N/A 01/09/2022   Procedure: LEFT HEART CATH AND CORONARY ANGIOGRAPHY;  Surgeon: Wonda Sharper, MD;  Location: [facility] INVASIVE CV LAB;  Service:  CV:: Widely patent coronaries with mild nonobstructive LAD plaquing.  Right dominant system.  Normal LVEDP.  Based on clinical presentation, findings are consistent with acute Takotsubo Cardiomyopathy Syndrome   POSTERIOR FUSION LUMBAR SPINE  05/24/2022   [facility], [place]; [name], MD): L2-L3 XLIF, L2-L3 POSTERIOR DECOMPRESSION AND FUSION   TRANSTHORACIC ECHOCARDIOGRAM  01/08/2022   Severely decreased LV function-EF 25 to 30%.  Mid to apical (mostly anterior) with normal basal motion.  GR 2 DD-elevated LAP.  Mildly dilated LA.  Aortic sclerosis with no stenosis.  No AI.  Normal MV with mild to moderate TR.  Mildly elevated RAP, and PAP (estimated 49 mg). If LAD CAD ruled out - consistent with Takotsubo CM Syndrome.   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) 12/30/2023   New onset atrial fibrillation (HCC) 12/30/2023   Myopia of both eyes with astigmatism and presbyopia 05/06/2023   Dry eye syndrome of both eyes 05/06/2023  Mild tricuspid regurgitation 05/06/2022   Mild mitral regurgitation 05/06/2022   Hypertension 05/06/2022   History of seizure 05/06/2022   Takotsubo cardiomyopathy-resolved 01/10/2022   Hyperlipidemia with target LDL less than 100 01/10/2022   Hypothyroid 01/10/2022   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) 01/08/2022   Numbness in both legs 04/10/2021   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   Posterior vitreous detachment of both eyes 02/05/2018   Ventricular premature beats 10/31/2015   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   Pure hypercholesterolemia  06/28/2014   Plantar fasciitis 06/28/2014    PCP: Windy Coy, MD   REFERRING PROVIDER: Vernetta Lonni GRADE, MD  REFERRING DIAG:  [phone] (ICD-10-CM) - Pain in right hip  M54.50 (ICD-10-CM) - Low back pain, unspecified  Eval and treat low back/right pelvis and right hip/ishium Any modalities per therapist   THERAPY DIAG:  Radiculopathy, lumbar region  Pain in right hip  Muscle weakness (generalized)  Cramp and spasm  Other abnormalities of gait and mobility  Unsteadiness on feet  RATIONALE FOR EVALUATION AND TREATMENT: Rehabilitation  ONSET DATE: Memorial Day weekend  NEXT MD VISIT:  To be scheduled 8-10 days s/p lumbar MRI    SUBJECTIVE:                                                                                                                                                                                                         SUBJECTIVE STATEMENT: Pt reports she got the best relief so far from the TPDN last visit after the initial increased soreness for ~36 hrs. Her R lower buttock still hurts but not as bad.  She notes last night she was able to roll over in bed w/o triggering her pain there way it normally would.  EVAL: [name] Trujillo is well know to me from several prior PT episodes for foot drop, cervical and lumbar radiculopathy.  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  Woke up the next morning with severe R hip/buttock pain.  Pain mostly localized to sciatic and ischial area of her R buttock.  PCP provider told her it was likely hip bursitis but ortho MD feels like it is more likely radicular from her back.  Some relief from steroid injection around her right hip ischial area provided by ortho MD but pain has never fully gone away.   She does have a significant lurch in her gait which is somewhat more pronounced than on prior PT episodes with  this PT.  PAIN: Are you  having pain? Yes: NPRS scale: 0/10 at rest/sitting; 1/10 with standing or walking  Pain location: R lower buttock  Pain description: burning, numb pain  PERTINENT HISTORY:  L2-3 XLIF/PSF 05/24/22; C5-7 ACDF 09/21/20; idiopathic progressive neuropathy; hypthyroidism; DDD; remote h/o back surgery at age 50; h/o scoliosis; Takotsubo cardiomyopathy 01/10/22; CAD; paroxysmal afib; mitral and tricuspid regurgitation; myalgia due to statin; recent cataract surgery  PRECAUTIONS: Fall  RED FLAGS: Bowel or bladder incontinence: Yes: chronic bladder issues, bowel urgency - somewhat worse than before   WEIGHT BEARING RESTRICTIONS: No  FALLS:  Has patient fallen in last 6 months? No  LIVING ENVIRONMENT: Lives with: lives with their spouse Lives in: House/apartment Stairs: Yes: External: 5-6 steps; on left going up Has following equipment at home: Walker - 2 wheeled, shower chair, and hiking/walking poles  OCCUPATION: Retired runner, broadcasting/film/video  PLOF: Independent and Leisure: walking ~30 minutes 1x/day (3x around the court near where she lives); sewing/quilting; likes working in the yard    PATIENT GOALS: To start walking again w/o doing more damage.   OBJECTIVE: (objective measures completed at initial evaluation unless otherwise dated)  DIAGNOSTIC FINDINGS:  04/21/24 - XR RIGHT HIP: An AP pelvis and lateral of the right hip shows well-maintained joint space with no cortical irregularities around the hip or the trochanteric area of the hip.  The lower lumbar spine shows severe degenerative changes at L4-L5 and L5-S1 on the AP view.   08/27/22 - LUMBAR SPINE - COMPLETE 4+ VIEW FINDINGS: There is mild levoconvex curvature of the lumbar spine, unchanged. Alignment is anatomic. There are bilateral transpedicular screws in posterior fusion rods present at L2-L3, new from prior. Disc spacers present. Alignment is anatomic. No fractures are identified. There is moderate severe disc space narrowing at L4-L5 and  L5-S1 as well as L1-L2 similar to the prior study. There are atherosclerotic calcifications of the aorta.   IMPRESSION: 1. Status post posterior fusion at L2-L3. Alignment is anatomic. 2. Multilevel degenerative disc disease, similar to prior study.  PATIENT SURVEYS:  Modified Oswestry:  MODIFIED OSWESTRY DISABILITY SCALE Date:  05/11/2024 07/06/24 08/24/2024   Pain intensity 2 =  Pain medication provides me with complete relief from pain. 2 0   2. Personal care (washing, dressing, etc.) 1 =  I can take care of myself normally, but it increases my pain. 2 0  3. Lifting 4 = I can lift only very light weights 4 2  4. Walking 1 = Pain prevents me from walking more than 1 mile. 3 3  5. Sitting 1 =  I can only sit in my favorite chair as long as I like. 1 1  6. Standing 2 =  Pain prevents me from standing more than 1 hour 3 1  7. Sleeping 2 =  Even when I take pain medication, I sleep less than 6 hours 1 1  8. Social Life 2 = Pain prevents me from participating in more energetic activities (eg. sports, dancing). 3 2  9. Traveling 2 =  My pain restricts my travel over 2 hours. 3 2  10. Employment/ Homemaking 2 = I can perform most of my homemaking/job duties, but pain prevents me from performing more physically stressful activities (eg, lifting, vacuuming). 2 2  Total 17 / 50 = 34.0 % 24 / 50 = 48.0 % 14 / 50 = 28.0 %  Interpretation Moderate disability Severe disability Moderate disability   Interpretation of scores: Score Category Description  0-20%  Minimal Disability The patient can cope with most living activities. Usually no treatment is indicated apart from advice on lifting, sitting and exercise  21-40% Moderate Disability The patient experiences more pain and difficulty with sitting, lifting and standing. Travel and social life are more difficult and they may be disabled from work. Personal care, sexual activity and sleeping are not grossly affected, and the patient can usually be managed  by conservative means  41-60% Severe Disability Pain remains the main problem in this group, but activities of daily living are affected. These patients require a detailed investigation  61-80% Crippled Back pain impinges on all aspects of the patients life. Positive intervention is required  81-100% Bed-bound These patients are either bed-bound or exaggerating their symptoms  [name] Trujillo, et al. Surgery versus conservative management of stable thoracolumbar fracture: the PRESTO feasibility RCT. [place] ([place]): [employer]; 2021 Nov. [facility] Technology Assessment, No. 25.62.) Appendix 3, Oswestry Disability Index category descriptors. Available from: Findjewel[employer]cz Minimally Clinically Important Difference (MCID) = 12.8%  LEFS  Extreme difficulty/unable (0), Quite a bit of difficulty (1), Moderate difficulty (2), Little difficulty (3), No difficulty (4) Survey date:  05/11/24 07/06/24   Any of your usual work, housework or school activities 1 2  2. Usual hobbies, recreational or sporting activities 2 2  3. Getting into/out of the bath 2 2  4. Walking between rooms 2 4  5. Putting on socks/shoes 2 4  6. Squatting  0 2  7. Lifting an object, like a bag of groceries from the floor 2 3  8. Performing light activities around your home 2 2  9. Performing heavy activities around your home 0 1  10. Getting into/out of a car 2 1  11. Walking 2 blocks 0 1  12. Walking 1 mile 2 0  13. Going up/down 10 stairs (1 flight) 2 3  14. Standing for 1 hour 2 1  15.  sitting for 1 hour 3 4  16. Running on even ground 0 0  17. Running on uneven ground 0 0  18. Making sharp turns while running fast 0 0  19. Hopping  0 0  20. Rolling over in bed 2 3  Score total:   26 / 80 = 32.5 % 35 / 80 = 43.8 %  Functional limitation: Moderate Moderate     SCREENING FOR RED FLAGS: Bowel or bladder incontinence: Yes: chronic bladder issues, bowel urgency -  somewhat worse than before Spinal tumors: No Cauda equina syndrome: No Compression fracture: No Abdominal aneurysm: No  COGNITION:  Overall cognitive status: Within functional limits for tasks assessed    SENSATION: WFL Except B distal LE neuropathy  POSTURE:  rounded shoulders, forward head, right pelvic obliquity, and scoliosis  PALPATION: Increased muscle tension in L>R lumbar paraspinals and R>L glutes and piriformis with TTP over inferior glutes and piriformis near hamstring origin  LUMBAR ROM: *Lumbar ROM assessed with UE support for balance and close SBA/CGA of PT  Active  Eval  Flexion Hands to distal shins  Extension 50% limited  Right lateral flexion Hand to upper thigh - p!  Left lateral flexion Hand to lateral knee  Right rotation 50% limited  Left rotation 30% limited - p!  (Blank rows = not tested)  MUSCLE LENGTH: Hamstrings: mild/mod tight B ITB: mild tight B Piriformis: mod/severe tight B Hip IR: mod tight B Hip flexors: mild tight R Quads: mild tight R>L Heelcord: mild/mod tight B  LOWER EXTREMITY  ROM:    Grossly WFL other than limitations indicated above due to muscle tightness   LOWER EXTREMITY MMT:    MMT Right eval Left eval R 07/06/24 L 07/06/24 R 08/24/24 L 08/24/24  Hip flexion 3+ 4- 4- 4 4 4+  Hip extension 4- 4 4 4 4  4+  Hip abduction 3+ 4- 4 4 4 4   Hip adduction 3+ 4- 4- 4 4- 4-  Hip internal rotation 4- 4- 4+ 4 4+ 4  Hip external rotation 3+ 3+ 4 4- 4 p! 4-  Knee flexion 4 4 4+ 4+ 5 5  Knee extension 4 4 4+ 4+ 5 5  Ankle dorsiflexion 3- 3- 3+ 3- 3 3-  Ankle plantarflexion        Ankle inversion        Ankle eversion         (Blank rows = not tested)  FUNCTIONAL TESTS: (Assessment completed 05/14/24 unless otherwise indicated) 5 times sit to stand: 12.37 sec w/o UE assist - 06/01/24 Timed up and go (TUG): 13.75 sec with single hiking pole 10 meter walk test: 16.00 sec with single hiking pole Gait speed: 2.05 ft/sec with single  hiking pole Berg Balance Scale: 42/56; 37-45 significant (>80%) - 05/19/24 Functional gait assessment: 10/30, < 19 = high risk fall    Standardized testing results as of discharge from latest PT episode (11/11/23): 5xSTS = 11.84 sec  = 13.63 sec w/o AD Gait speed = 2.41 ft/sec w/o AD Berg = 45/56; scores of 37-45 indicate significant (>80%) fall risk  FGA = 19/30; scores of 19-24 indicate a medium risk for falls   GAIT: Distance walked: Clinic distances Assistive device utilized: Single walking/trekking pole on R Level of assistance: SBA Gait pattern: Increased sway, step through pattern, decreased stride length, trendelenburg, lateral hip instability, poor foot clearance- Right, and poor foot clearance- Left Comments: B foot slap   TODAY'S TREATMENT:    08/24/2024 - Recert THERAPEUTIC EXERCISE: To improve strength and endurance.   Rec Bike - L4 x 6'   THERAPEUTIC ACTIVITIES: To improve functional performance.  Demonstration, verbal and tactile cues throughout for technique. Modified Oswestry: 14 / 50 = 28.0 % LE MMT  PHYSICAL PERFORMANCE TEST or MEASUREMENT: Functional Gait  Assessment  Gait Level Surface Walks 20 ft, slow speed, abnormal gait pattern, evidence for imbalance or deviates 10-15 in outside of the 12 in walkway width. Requires more than 7 sec to ambulate 20 ft.   Change in Gait Speed Able to change speed, demonstrates mild gait deviations, deviates 6-10 in outside of the 12 in walkway width, or no gait deviations, unable to achieve a major change in velocity, or uses a change in velocity, or uses an assistive device.   Gait with Horizontal Head Turns Performs head turns with moderate changes in gait velocity, slows down, deviates 10-15 in outside 12 in walkway width but recovers, can continue to walk.   Gait with Vertical Head Turns Performs task with slight change in gait velocity (eg, minor disruption to smooth gait path), deviates 6 - 10 in outside 12 in walkway  width or uses assistive device   Gait and Pivot Turn Pivot turns safely in greater than 3 sec and stops with no loss of balance, or pivot turns safely within 3 sec and stops with mild imbalance, requires small steps to catch balance.   Step Over Obstacle Is able to step over one shoe box (4.5 in total height) without changing gait speed. No  evidence of imbalance.   Gait with Narrow Base of Support Ambulates less than 4 steps heel to toe or cannot perform without assistance.   Gait with Eyes Closed Cannot walk 20 ft without assistance, severe gait deviations or imbalance, deviates greater than 15 in outside 12 in walkway width or will not attempt task.   Ambulating Backwards Walks 20 ft, uses assistive device, slower speed, mild gait deviations, deviates 6-10 in outside 12 in walkway width.   Steps Alternating feet, must use rail.   Total Score 14   FGA comment: < 19 = high risk fall      SELF CARE:  Review of progress with PT, status of goals, and plans for ongoing PT POC requesting patient input and feedback to ensure PT is addressing all of her concerns.    08/12/2024 THERAPEUTIC EXERCISE: To improve strength, endurance, ROM, and flexibility.  Demonstration, verbal and tactile cues throughout for technique.  Rec Bike - L4 x 6'  Supine R SKTC stretch x 30 Hooklying R KTOS piriformis stretch x 30 Hooklying R figure-4 piriformis stretch x 30 - patient reporting much better tolerance for this stretch after TPDN today than in recent attempts Hooklying R sciatic nerve glide 2 x 10  TRIGGER POINT DRY NEEDLING: Treatment instructions/education: Subsequent Treatment: Instructions provided previously at initial dry needling treatment.  Instructions reviewed, if requested by the patient, prior to subsequent dry needling treatment.  Education Handout Provided: Previously Provided Consent: Patient Verbal Consent Given: Yes Treatment: Muscles Treated: R glute medius and piriformis Utilized  skilled palpation to identify bony landmarks and trigger points along with monitoring of soft tissue during DN. Electrical Stimulation Performed: No Treatment Response/Outcome: Twitch response elicited, Palpable increase in muscle length, Decreased tissue resistance noted, Decreased pain/TTP, and Improved exercise tolerance  MANUAL THERAPY: To promote normalized muscle tension, improved flexibility, improved joint mobility, increased ROM, pain modulation, and reduced pain. STM/DTM, manual TPR and pin & stretch to muscles addressed with DN   SELF CARE: Provided education for HEP review including recommended frequency for home performance.  Verbally reviewed and consolidated remainder of current HEP, removing some of the more triggering exercises and clarifying recommended frequency for ongoing performance of HEP, emphasizing avoiding pushing any exercises or stretches into painful range and avoiding trying to push exercises past the point of fatigue.  Discussed recent increasing variability in pain and limited tolerance for PT as well as status in current POC with recertification due by 08/31/2024 however only 1 more [facility] Medicare visit currently authorized.  Explained that limited progress may indicate a need for transition to HEP pending MRI and follow-up consultations with orthopedic and neurosurgery, with full determination of plan TBD pending goal assessment and assessment response to TPDN.   08/10/2024  THERAPEUTIC EXERCISE: To improve strength, endurance, ROM, and flexibility.  Demonstration, verbal and tactile cues throughout for technique.  Rec Bike - L4 x 6' Seated 3-way lumbar flexion stretch with UE support on hiking pole 3 x 30 - center and L stretch triggering R anterolateral thigh pain, no radicular pain with R-sided stretch Standing lateral lean ITB/QL stretch holding onto wall ladder 2 x 30, 2nd rep adding slight trunk rotation toward ladder Standing lumbar flexion stretch holding  onto wall ladder 2 x 30 leaning straight back 2 x 30 with slight shift of hips to R  THERAPEUTIC ACTIVITIES: To improve functional performance.  Demonstration, verbal and tactile cues throughout for technique. Standing R hip extension diagonal with L foot on 2 block x  5 - mod R hip pain - discontinued d/t increased pain Standing L hip extension diagonal with R foot on 2 block x 5 - mod/severe increased R hip pain with more limited ROM and increased difficulty maintaining level pelvis - discontinued Mini squat x 5 - discontinued d/t increased pain Supported squat (re-attempted after stretches w/o increased pain) x 10  MANUAL THERAPY: To promote improved flexibility, pain modulation, and reduced pain utilizing manual TP therapy.  Attempted self-STM/release to R glutes with lacrosse ball on wall but pt reporting increased pain and not the normal release/relief that she typically experiences with this.   07/29/2024  THERAPEUTIC EXERCISE: To improve strength, endurance, and flexibility.   Rec Bike - L4 x 6'  SELF CARE:   Answered patient's questions and provided education regarding what MRI can determine, explaining difference between spinal canal stenosis and foraminal stenosis which are both present on her past MRIs and how her current symptoms correlate.  NEUROMUSCULAR RE-EDUCATION: To improve balance, coordination, kinesthesia, posture, proprioception, and reduce fall risk. Corner balance progression with narrow BOS on firm surface with arms crossed on chest            Eyes open: static stance 3 x 30 sec horiz head turns x 5 vertical head nods x 5 Pt noted to center majority of weight on L LE   THERAPEUTIC ACTIVITIES: To improve functional performance.  Demonstration, verbal and tactile cues throughout for technique. Standing R hip extension diagonal with L foot on 2 block x 10 - mild to no increased R hip pain Standing L hip extension diagonal with R foot on 2 block x 10 - mod  increased R hip pain with more limited ROM and increased difficulty maintaining level pelvis Standing L hip hike with R foot on 2 block x 5 - limited by increased pain   07/27/2024  THERAPEUTIC EXERCISE: To improve strength and endurance.   Rec Bike - L4 x 4', L3 x 2'  PHYSICAL PERFORMANCE TEST or MEASUREMENT: Berg Balance Test  Sit to Stand Able to stand without using hands and stabilize independently   Standing Unsupported Able to stand safely 2 minutes   Sitting with Back Unsupported but Feet Supported on Floor or Stool Able to sit safely and securely 2 minutes   Stand to Sit Sits safely with minimal use of hands   Transfers Able to transfer safely, minor use of hands   Standing Unsupported with Eyes Closed Able to stand 3 seconds   Standing Unsupported with Feet Together Able to place feet together independently but unable to hold for 30 seconds   From Standing, Reach Forward with Outstretched Arm Can reach forward >12 cm safely (5)   From Standing Position, Pick up Object from Floor Able to pick up shoe safely and easily   From Standing Position, Turn to Look Behind Over each Shoulder Turn sideways only but maintains balance   Turn 360 Degrees Able to turn 360 degrees safely one side only in 4 seconds or less   Standing Unsupported, Alternately Place Feet on Step/Stool Able to stand independently and complete 8 steps >20 seconds   Standing Unsupported, One Foot in [employer] balance while stepping or standing   Standing on One Leg Tries to lift leg/unable to hold 3 seconds but remains standing independently   Total Score 40   Berg comment: 37-45 = Significant (>80%) fall risk       THERAPEUTIC ACTIVITIES: To improve functional performance.  Demonstration, verbal and tactile cues  throughout for technique.  = 13.56 sec with single trekking pole, 13.10 sec w/o AD Gait speed = 2.42 ft/sec with single trekking pole, 2.50 ft/sec w/o AD Goal  assessment   07/15/2024 THERAPEUTIC EXERCISE: To improve strength and endurance.    Rec Bike - L4 x 6 min  THERAPEUTIC ACTIVITIES: To improve functional performance.  Demonstration, verbal and tactile cues throughout for technique. Standing R hip hike with L foot on 2 block x 10 Standing L hip hike with R foot on 2 block x 5 - limited by increased pain Standing R hip extension diagonal with L foot on 2 block x 10 - mild increased R hip pain Standing L hip extension diagonal with R foot on 2 block x 10 - mod increased R hip pain with more limited ROM Standing R hip extension diagonal with L foot on 2 block and torso leaning on pillow + white wedge on elevated mat table x 10 - less pain noted Standing L hip extension diagonal with R foot on 2 block and torso leaning on pillow + white wedge on elevated mat table x 10 - less pain noted  Eccentric squat touch to elevated mat table with UE support on back of chair x 10 Lateral step & lift over cone x 10 bil (simulating stepping over edge of tub) - mild increased pain in R buttock with L step-over   07/13/2024  THERAPEUTIC EXERCISE: To improve strength, endurance, ROM, and flexibility.  Demonstration, verbal and tactile cues throughout for technique.  Rec Bike - L4 x 4 min, L3 x 2 min Manual R hip flexor stretch x 30 Manual R TFL stretch x 30 Manual R ITB stretch x 30 S/L RTB clam x 10 bil Bridge + RTB hip ABD isometric - deferred due to increased R buttock and LE radicular pain Bridge + manual PT resisted hip ABD isometric - deferred due to increased R buttock and LE radicular pain Bridge + hip ADD ball squeeze isometric - deferred due to increased R buttock and LE radicular pain  MANUAL THERAPY: To promote movement utilizing connective tissue massage, therapeutic massage, manual TP therapy, and myofascial release. STM/DTM, manual TPR to R distal glutes, piriformis, TFL, and ITB  THERAPEUTIC ACTIVITIES: To improve functional  performance.  Demonstration, verbal and tactile cues throughout for technique. Standing hip extension diagonal with looped RTB at distal thighs x 8-10 bil (limited due to fatigue) - added to HEP without RTB Standing L/R hip hike with R foot on 2 block 2 x 10 Standing L/R hip circumduction with opp foot standing on 2 block - focusing on maintaining level pelvis x ~10 each side   07/08/2024  THERAPEUTIC EXERCISE: To improve strength, endurance, ROM, and flexibility.  Demonstration, verbal and tactile cues throughout for technique.  Rec Bike - L4 x 3 min, L3 x 3 min - resistance level reduced due to increasing fatigue Supine manual stretches: R hamstring stretch x 30 R mod Thomas quad/hip flexor stretch x 30 R glute cross body stretch x 30 R ITB cross body stretch x 30 R KTOS piriformis stretch x 30 R figure-4 to chest piriformis stretch x 30  NEUROMUSCULAR RE-EDUCATION: To improve coordination, kinesthesia, and proprioception.  Hooklying R sciatic nerve glide x 10 - patient reporting feeling of less motion at toes during nerve glide Supine/long sitting R isometric toe curls into therapist's hand x 10 Attempted long sitting R toe curls into YTB however patient unable to create AROM against resistance band  MANUAL  THERAPY: To promote normalized muscle tension, improved flexibility, and reduced pain utilizing connective tissue massage, therapeutic massage, manual TP therapy, and myofascial release.  STM/DTM, manual TPR, and percussive massage with massage gun to R distal glutes, piriformis, TFL, distal iliopsoas, vastus lateralis and ITB  MODALITIES: (non-billable) Moist heat pack to R glutes x 10' at end of session   07/06/2024 - Recert THERAPEUTIC EXERCISE: To improve strength and endurance.   Rec Bike - L4 x 3 min, L3 x 3 min - resistance level reduced due to increasing fatigue  THERAPEUTIC ACTIVITIES: To improve functional performance.  Demonstration, verbal and tactile cues  throughout for technique.  LE MMT - see above LE MMT table LEFS: 35/80 = 43.8% indicating moderate functional limitation Modified Oswestry: 24/50 = 48% or severe disability Gait speed = 2.08 ft/sec with single hiking pole Goal assessment  PHYSICAL PERFORMANCE TEST or MEASUREMENT: Functional Gait  Assessment  Gait Level Surface Walks 20 ft, slow speed, abnormal gait pattern, evidence for imbalance or deviates 10-15 in outside of the 12 in walkway width. Requires more than 7 sec to ambulate 20 ft.   Change in Gait Speed Makes only minor adjustments to walking speed, or accomplishes a change in speed with significant gait deviations, deviates 10-15 in outside the 12 in walkway width, or changes speed but loses balance but is able to recover and continue walking.   Gait with Horizontal Head Turns Performs head turns with moderate changes in gait velocity, slows down, deviates 10-15 in outside 12 in walkway width but recovers, can continue to walk.   Gait with Vertical Head Turns Performs task with slight change in gait velocity (eg, minor disruption to smooth gait path), deviates 6 - 10 in outside 12 in walkway width or uses assistive device   Gait and Pivot Turn Pivot turns safely in greater than 3 sec and stops with no loss of balance, or pivot turns safely within 3 sec and stops with mild imbalance, requires small steps to catch balance.   Step Over Obstacle Is able to step over one shoe box (4.5 in total height) without changing gait speed. No evidence of imbalance.   Gait with Narrow Base of Support Ambulates less than 4 steps heel to toe or cannot perform without assistance.   Gait with Eyes Closed Cannot walk 20 ft without assistance, severe gait deviations or imbalance, deviates greater than 15 in outside 12 in walkway width or will not attempt task.   Ambulating Backwards Walks 20 ft, slow speed, abnormal gait pattern, evidence for imbalance, deviates 10-15 in outside 12 in walkway width.    Steps Alternating feet, must use rail.   Total Score 12   FGA comment: < 19 = high risk fall        07/01/2024  THERAPEUTIC EXERCISE: To improve strength, endurance, ROM, and flexibility.  Demonstration, verbal and tactile cues throughout for technique.  Rec Bike - L3 x 6 min Seated 3-way thoracolumbar flexion stretch 2 x 30 each position, using hiking pole for UE support Supine R sciatic nerve glides 2 x 10 bil Hooklying R figure-4 piriformis stretch x 30 Hooklying R KTOS piriformis stretch x 30 - pt noting increased groin anterior thigh discomfort Mod thomas R quad/hip flexor stretch 2 x 30 - 1 rep each w/o and with strap Supine R HS stretch with strap x 30 Supine R ITB stretch with strap x 30   MANUAL THERAPY: To promote normalized muscle tension, improved flexibility, improved joint mobility, and reduced  pain utilizing connective tissue massage, therapeutic massage, manual TP therapy, and myofascial release.  STM/DTM and manual TPR to R lower glutes and piriformis   SELF CARE:  Provided education on self-STM/muscle release options and need to break up her travel to [place] with frequent stops to allow her to stretch and walk around.   MODALITIES: (non-billable) Moist heat pack to R glutes x 10' at end of session   06/24/2024 THERAPEUTIC EXERCISE: To improve strength, endurance, ROM, and flexibility.  Demonstration, verbal and tactile cues throughout for technique.  Rec Bike - L3 x 6 min Seated 3-way thoracolumbar flexion stretch 2 x 30 each position, using hiking pole for UE support, clarifying angle for lateral stretches Seated B foot/ankle arch raises x 10 Seated toe curls/towel scrunches - patient with difficulty achieving grip of towel with toes therefore suggested patient try using a small object like a pen or pencil to work on grip with toes at home  NEUROMUSCULAR RE-EDUCATION: To improve coordination, kinesthesia, and reduce nerve irritation. Seated  sciatic nerve glides - difficulty coordinating movement  Supine sciatic nerve glides 2 x 10 bil Supine sciatic nerve glides with 90/90 knee flexion x 10 bil - patient reporting better response to previous exercise  THERAPEUTIC ACTIVITIES: To improve functional performance.  Demonstration, verbal and tactile cues throughout for technique. Unsupported mini-squat hovering over mat table with arms forward for balance (chair placed in front for safety with intermittent support) x 10 - attempted in bare feet today as patient feeling like she is losing proprioceptive awareness and control in her feet, however still demonstrating frequent loss of eccentric control/uncontrolled descent onto mat table   06/22/2024 THERAPEUTIC EXERCISE: To improve strength, endurance, and flexibility.  Demonstration, verbal and tactile cues throughout for technique.  Rec Bike - L3 x 6 min Seated 3-way thoracolumbar flexion stretch 2 x 30 each position using hiking pole for UE support  THERAPEUTIC ACTIVITIES: To improve functional performance.  Demonstration, verbal and tactile cues throughout for technique. Unsupported mini-squat hovering over mat table with arms forward for balance (chair placed in front for safety with intermittent support) x 10 - more frequent loss of eccentric control/uncontrolled descent onto mat table Unsupported mini-squat with glute touch touch to rounded bolster on mat table with arms forward for balance (chair placed in front for safety with intermittent support) x 10  NEUROMUSCULAR RE-EDUCATION: To improve balance, coordination, kinesthesia, posture, proprioception, and amplitude of movement.  S/L R hip ABD/ext diagonals 2 x 5 - L knee flexed for increased ant/post stability to prevent pt from rolling backward & PT guiding motion for 1st few reps; repeated x 10 with L LE  S/L R hip circumduction arc over L LE tapping R foot in front and behind L foot x 5 - very limited eccentric control with  posterior tap; repeated x 10 with L LE   06/08/2024 THERAPEUTIC EXERCISE: To improve strength, endurance, ROM, and flexibility.  Demonstration, verbal and tactile cues throughout for technique. Rec Bike - L3 x 6 min Seated 3-way thoracolumbar flexion stretch 2 x 30 each position - 1st reps with hands on green Pball, 2nd reps using hiking pole for UE support Quadruped 3-way child's pose x 30 each  THERAPEUTIC ACTIVITIES: To improve functional performance.  Demonstration, verbal and tactile cues throughout for technique. Reviewed proper lifting mechanics using squatting motion and golfer's lift  Unsupported mini-squat hovering over mat table with arms forward for balance (chair placed in front for safety but not needed for support) x  8  SELF CARE:  Provided education on managing muscle cramping through stretching, increased hydration, and potential Mg supplement (instructed pt to verify with PCP that there are no concerns or potential medicine interactions with trying Mg supplement).   06/03/2024  THERAPEUTIC EXERCISE: To improve strength, endurance, and flexibility.  Demonstration, verbal and tactile cues throughout for technique.  Rec Bike - L3 x 6 min Standing lateral lean R ITB over back of chair 2 x 30 Supine ITB stretch with strap 3 x 30 bil  MANUAL THERAPY: To promote normalized muscle tension, improved flexibility, and reduced pain utilizing connective tissue massage, therapeutic massage, manual TP therapy, and myofascial release.  STM/DTM, IASTM with foam roller and manual TPR to R distal glutes, HS and ITB  NEUROMUSCULAR RE-EDUCATION: To improve coordination, kinesthesia, posture, proprioception, and amplitude of movement.  S/L R hip ABD/ext diagonals x 10 - slight PT assist initially to stabilize hips to prevent anterior/posterior rotation & VC/TC to avoid hip ER S/L R hip circumduction arc over L LE tapping R foot in front and behind L foot x 5 - very limited eccentric control  with posterior tap   SELF CARE:  HEP update Provided instruction in self-STM techniques to R ITB using rolling pin.    06/01/2024 THERAPEUTIC EXERCISE: To improve strength and endurance.  Demonstration, verbal and tactile cues throughout for technique. NuStep - L4 x 6 min (LE only)  THERAPEUTIC ACTIVITIES: To improve functional performance.  Demonstration, verbal and tactile cues throughout for technique. 5xSTS = 12.37 sec w/o UE assist, somewhat uncontrolled descent esp on last rep  TRIGGER POINT DRY NEEDLING: Treatment instructions/education: Initial Treatment: Pt instructed on Dry Needling rational, procedures, and possible side effects. Pt instructed to expect mild to moderate muscle soreness later in the day and/or into the next day.  Pt instructed in methods to reduce muscle soreness. Pt instructed to continue prescribed HEP. Patient was educated on signs and symptoms of infection and other risk factors and advised to seek medical attention should they occur.  Patient verbalized understanding of these instructions and education.  Education Handout Provided: Yes Consent: Patient Verbal Consent Given: Yes Treatment: Muscles Treated: R medial and lateral piriformis, R glute medius Utilized skilled palpation to identify bony landmarks and trigger points along with monitoring of soft tissue during DN. Electrical Stimulation Performed: No Treatment Response/Outcome: Twitch response elicited, Palpable increase in muscle length, Decreased tissue resistance noted, Decreased pain/TTP, and Improved exercise tolerance  MANUAL THERAPY: To promote normalized muscle tension, improved flexibility, and reduced pain. STM/DTM, manual TPR and pin & stretch to muscles addressed with DN   NEUROMUSCULAR RE-EDUCATION: To improve balance, coordination, kinesthesia, posture, proprioception, and amplitude of movement. Mini-squat with counter support x 10   05/19/24 THERAPEUTIC EXERCISE: To improve  ROM and flexibility.  Demonstration, verbal and tactile cues throughout for technique.  Bike L3x23min Standing runner stretch x 30 BLE Standing soleus stretch x 30 BLE Table piriformis stretch 2x30 RLE Supine L figure 4 stretch 2x30 LLE Supine glute stretch 2x30 BLE Supine KTOS stretch 2x30 BLE  Seated clams with GTB + trA x 10 BLE Seated marching GTB x + TrA 10 BLE Berg Balance Test  Sit to Stand Able to stand without using hands and stabilize independently   Standing Unsupported Able to stand 2 minutes with supervision   Sitting with Back Unsupported but Feet Supported on Floor or Stool Able to sit safely and securely 2 minutes   Stand to Sit Sits safely with minimal use of  hands   Transfers Able to transfer safely, minor use of hands   Standing Unsupported with Eyes Closed Able to stand 10 seconds with supervision   Standing Unsupported with Feet Together Able to place feet together independently and stand 1 minute safely   From Standing, Reach Forward with Outstretched Arm Can reach confidently >25 cm (10)   From Standing Position, Pick up Object from Floor Able to pick up shoe safely and easily   From Standing Position, Turn to Look Behind Over each Shoulder Looks behind one side only/other side shows less weight shift   Turn 360 Degrees Able to turn 360 degrees safely but slowly   Standing Unsupported, Alternately Place Feet on Step/Stool Able to complete 4 steps without aid or supervision   Standing Unsupported, One Foot in Front Needs help to step but can hold 15 seconds   Standing on One Leg Unable to try or needs assist to prevent fall   Total Score 42       05/14/2024  THERAPEUTIC ACTIVITIES: To improve functional performance.  Demonstration, verbal and tactile cues throughout for technique.  5xSTS: Deferred due to increased pain with uncontrolled descent upon sitting TUG: 13.75 sec with single hiking pole : 16.00 sec with single hiking pole Gait speed: 2.05  ft/sec with single hiking pole  PHYSICAL PERFORMANCE TEST or MEASUREMENT: Functional Gait  Assessment  Gait Level Surface Walks 20 ft, slow speed, abnormal gait pattern, evidence for imbalance or deviates 10-15 in outside of the 12 in walkway width. Requires more than 7 sec to ambulate 20 ft.   Change in Gait Speed Makes only minor adjustments to walking speed, or accomplishes a change in speed with significant gait deviations, deviates 10-15 in outside the 12 in walkway width, or changes speed but loses balance but is able to recover and continue walking.   Gait with Horizontal Head Turns Performs head turns with moderate changes in gait velocity, slows down, deviates 10-15 in outside 12 in walkway width but recovers, can continue to walk.   Gait with Vertical Head Turns Performs task with moderate change in gait velocity, slows down, deviates 10-15 in outside 12 in walkway width but recovers, can continue to walk.   Gait and Pivot Turn Turns slowly, requires verbal cueing, or requires several small steps to catch balance following turn and stop   Step Over Obstacle Is able to step over one shoe box (4.5 in total height) without changing gait speed. No evidence of imbalance.   Gait with Narrow Base of Support Ambulates less than 4 steps heel to toe or cannot perform without assistance.   Gait with Eyes Closed Cannot walk 20 ft without assistance, severe gait deviations or imbalance, deviates greater than 15 in outside 12 in walkway width or will not attempt task.   Ambulating Backwards Walks 20 ft, slow speed, abnormal gait pattern, evidence for imbalance, deviates 10-15 in outside 12 in walkway width.   Steps Alternating feet, must use rail.   Total Score 10   FGA comment: < 19 = high risk fall      THERAPEUTIC EXERCISE: To improve ROM and flexibility.  Demonstration, verbal and tactile cues throughout for technique.  Hooklying R HS stretch with strap hamstring 2 x 30 Supine gluteus stretch 2  x 30 bil Hooklying figure-4 piriformis stretch x 30 on L, deferred d/t increased pain on R Hooklying KTOS piriformis stretch x 30 on L, deferred d/t increased pain on R Side-sitting R modified pigeon piriformis stretch  2 x 30 - good stretch noted without increased pain Seated R KTOS piriformis stretch 2 x 30 - good stretch noted without increased pain  SELF CARE:   Modified HEP to address concerns regarding increased R buttock/hip pain with piriformis stretching, with patient reporting better tolerance for seated piriformis stretching.  Patient cautioned not to force any painful ROM/stretching and report back to PT next visit if HEP continues to irritate or cause increased pain. Provided instruction in self-STM techniques to R glutes/piriformis using tennis ball against wall or in chair.    05/04/2024  SELF CARE:  Reviewed eval findings and role of PT in addressing identified deficits as well as instruction in initial HEP (see below).    PATIENT EDUCATION:  Education details: standardized testing results and interpretation, progress with PT, ongoing PT POC, and continue with current HEP  Person educated: Patient Education method: Explanation Education comprehension: verbalized understanding  HOME EXERCISE PROGRAM: Access Code: [facility] URL: [email or web address] Date: 08/12/2024 Prepared by: Elijah Hidden  Exercises - Standing Soleus Stretch  - 1 x daily - 7 x weekly - 3 sets - 3 reps - 30 sec hold - Hooklying Hamstring Stretch with Strap  - 2 x daily - 7 x weekly - 3 reps - 30 sec hold - Supine Gluteus Stretch  - 2 x daily - 7 x weekly - 3 reps - 30 sec hold - Supine Figure 4 Piriformis Stretch  - 2 x daily - 7 x weekly - 3 reps - 30 sec hold - Supine Piriformis Stretch with Foot on Ground  - 2 x daily - 7 x weekly - 3 reps - 30 sec hold - Seated Table Piriformis Stretch  - 2 x daily - 7 x weekly - 3 reps - 30 sec hold - Seated Piriformis Stretch  - 2 x daily - 7 x  weekly - 3 reps - 30 sec hold - Seated March with Resistance  - 1 x daily - 7 x weekly - 2 sets - 10 reps - Seated Isometric Hip Abduction with Resistance  - 1 x daily - 7 x weekly - 2 sets - 10 reps - Glute Max Release With [employer] Against Wall  - 1 x daily - 7 x weekly - 1-2 min hold - Mini Squat with Counter Support  - 1 x daily - 3 x weekly - 2 sets - 10 reps - 3-5 sec hold - Supine Iliotibial Band Stretch with Strap  - 2 x daily - 7 x weekly - 3 reps - 30 sec hold - Sidelying Diagonal Hip Abduction  - 1 x daily - 3 x weekly - 2 sets - 10 reps - 3 sec hold - Seated 3 Way Exercise Ball Roll Out Stretch  - 1 x daily - 7 x weekly - 2 sets - 3 reps - 30 sec hold - Supine Sciatic Nerve Glide  - 2-3 x daily - 7 x weekly - 2 sets - 10 reps - 3 sec hold - Hip Hiking on Step  - 1 x daily - 3 x weekly - 2 sets - 10 reps - 3 sec hold - Diagonal Hip Extension  - 1 x daily - 3 x weekly - 2 sets - 10 reps - 3 sec hold  Patient Education - Lifting Techniques   ASSESSMENT:  CLINICAL IMPRESSION: [name] Trujillo reports the best relief so far from the TPDN last visit once the initial expected post-procedure soreness wore off, noting improving tolerance for ambulation around her  home and less pain with rolling over in bed.  Modified Oswestry score reflects the decrease in pain interference with activities with score improved by 20% to 28%, although still shy of the goal of 22% disability.  Improving balance also observed on the FGA with score increased to 14/30 today, although still indicating a high risk for falls.  Unfortunately during the testing for the FGA, [name] Trujillo began to experience increased pain in her R buttock with the unsupported walking, limiting further testing today other than MMT.  MMT more variable with knees now increased to 5/5 and some improvement noted at hips, although ankles have regressed slightly.  Given positive response to TPDN and overall progress toward goals, Evolett will benefit from  continued skilled PT to address ongoing abnormal muscle tension, flexibility, strength and balance deficits to improve mobility and activity tolerance with decreased pain interference and decreased risk for falls, therefore will recommend recert for additional 1-2x/wk for up to 8 weeks.  Her lumbar MRI is scheduled for 09/07/24 and her f/u with her surgeon, Dr. Emmitt, will occur on 09/21/24 - we may adjust/modify her POC pending the results/outcome of these visits.   EVAL: [name] Trujillo is a 83 y.o. female who was referred to physical therapy for evaluation and treatment for R hip/buttock pain presumed to be the result of lumbar radiculopathy.  Patient reports onset of R buttock pain beginning Memorial Day weekend 2025.  Pain is worse with walking and positional changes.  Patient has deficits in hip ROM, proximal LE flexibility, B LE strength, abnormal posture, and TTP with abnormal muscle tension in R lower glutes and piriformis which are interfering with ADLs and are impacting quality of life.  On Modified Oswestry patient scored 17/50 demonstrating 34% or moderate disability.  On LEFS patient scored 26/80 demonstrating moderate functional limitation.  Given her significant LE weakness and impaired gait pattern, will plan more formalized balance testing next visit.  [name] Trujillo will benefit from skilled PT to address above deficits to improve mobility and activity tolerance with decreased pain interference.     OBJECTIVE IMPAIRMENTS: Abnormal gait, decreased activity tolerance, decreased balance, decreased knowledge of condition, decreased mobility, difficulty walking, decreased ROM, decreased strength, increased fascial restrictions, impaired perceived functional ability, increased muscle spasms, impaired flexibility, impaired sensation, improper body mechanics, postural dysfunction, and pain.   ACTIVITY LIMITATIONS: carrying, lifting, bending, standing, squatting, sleeping, stairs, transfers, bed mobility,  bathing, toileting, dressing, reach over head, hygiene/grooming, locomotion level, and caring for others  PARTICIPATION LIMITATIONS: meal prep, cleaning, laundry, driving, shopping, community activity, yard work, and church  PERSONAL FACTORS: Age, Past/current experiences, Time since onset of injury/illness/exacerbation, and 3+ comorbidities: L2-3 XLIF/PSF 05/24/22; C5-7 ACDF 09/21/20; idiopathic progressive neuropathy; hypthyroidism; DDD; remote h/o back surgery at age 41; h/o scoliosis; 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   REHAB POTENTIAL: Good  CLINICAL DECISION MAKING: Unstable/unpredictable  EVALUATION COMPLEXITY: High   GOALS: Goals reviewed with patient? Yes  SHORT TERM GOALS: Target date: 06/15/2024  Patient will be independent with initial HEP to improve outcomes and carryover.  Baseline: Initial HEP provided on eval Goal status: MET - 05/19/24  2.  Patient will report 25% improvement in low back pain to improve QOL. Baseline: 2/10 on eval, up to 10/10 06/03/24 - improvement in pain noted by end of today's session Goal status: MET - 06/08/24 - Pt reports 50+% improvement in pain thus far  3.  Complete standardized balance assessment and update LTG's as indicated. Baseline:  05/14/24 - , TUG and FGA completed; Berg and 5xSTS pending  05/19/24 [name] Trujillo completed Goal status: MET - 06/01/24 - 5xSTS completed  LONG TERM GOALS: Target date: 07/20/2024, extended to 08/31/2024  Patient will be independent with ongoing/advanced HEP for self-management at home.  Baseline:  06/22/24 - HEP reviewed today 07/06/24 - no concerns with current HEP Goal status: IN PROGRESS - 08/10/24 - Encouraged patient to continue to work on HEP without increased pain as tolerated  2.  Patient will report 50-75% improvement in R hip/pelvic pain to improve  QOL.  Baseline: 2/10 on eval, up to 10/10 06/08/24 - Pt reports 50+% improvement in pain thus far 07/06/24 - 50-60% improvement overall, but still having periods of more intense pain 07/27/24 - still at least 50% improvement overall, but pain still fluctuating  Goal status: IN PROGRESS - 08/10/24 - pain still fluctuating, but slightly worse after increased activity over Thanksgiving holiday  3.  Patient will demonstrate improved B proximal LE strength to >/= 4 to 4+/5 for improved stability and ease of mobility. Baseline: Refer to above LE MMT table 07/06/24 Goal status: IN PROGRESS - 08/24/25   4.  Patient will report >/= 35/80 on LEFS (MCID = 9 pts) to demonstrate improved functional ability. Baseline: 26 / 80 = 32.5 % Goal status: MET - 07/06/24 - 35 / 80 = 43.8 %  5. Patient will report </= 22% on Modified Oswestry (MCID = 12%) to demonstrate improved functional ability with decreased pain interference. Baseline: 17 / 50 = 34.0 % 07/06/24 - 24 / 50 = 48.0 % Goal status: IN PROGRESS - 08/24/24 - 14 / 50 = 28.0 % - improving  6.  Patient will resume her normal walking routine w/o increased pain to allow for  improved mobility and activity tolerance. Baseline: R ischial pain limiting walking tolerance 07/06/24 - attempted to try walking in her neighborhood last week but experienced increased pain while trying to walk up a incline that caused her to have to stop and return home  Goal status: IN PROGRESS - 07/27/24 - patient reports she has been walking ~1/4 of her normal distance staying on level ground and not walking nearly as often as normal (was usually walking daily)  7.  Patient will improve Berg score by at least 8 points or to >/= 45/56 (baseline as of prior D/C from PT) to improve safety and stability with ADLs in standing and reduce risk for falls.  Baseline: 42/56 (05/19/24) Goal status: IN PROGRESS - 07/27/24 - individual item scores fluctuating but overall score decreased by 2  points to 40/56  8.  Patient will improve FGA score by at least 4 points or to >/= 19/30 (baseline as of prior D/C from PT) to improve gait stability and reduce risk for falls.   Baseline: 10/30 (05/14/24) 07/06/24 - 12/30 Goal status: PARTIALLY MET - 08/24/24 - 14/30  9.  Patient will improve gait velocity to at least 2.62 ft/sec for improved gait efficiency and safety with community ambulation.  Baseline: 2.05 ft/sec with single hiking pole (05/14/24) 07/06/24 - 2.08 ft/sec with single hiking pole Goal status: IN PROGRESS - 07/27/24 - 07/27/24 - 2.42 ft/sec with single trekking pole, 2.50 ft/sec w/o AD   PLAN:  PT FREQUENCY: 2x/week  PT DURATION: 8 weeks  PLANNED INTERVENTIONS: 97164- PT Re-evaluation, 97750- Physical Performance Testing, 97110-Therapeutic exercises, 97530- Therapeutic activity, 97112- Neuromuscular re-education, 97535- Self Care, 02859- Manual  therapy, [phone]- Gait training, [phone]- Electrical stimulation (unattended), [phone]- Ultrasound, 02966- Ionotophoresis 4mg /ml Dexamethasone, 20560 (1-2 muscles), 20561 (3+ muscles)- Dry Needling, Patient/Family education, Balance training, Stair training, Taping, Joint mobilization, DME instructions, Cryotherapy, and Moist heat  PLAN FOR NEXT SESSION: Reassess remaining LTG's as able; MT +/- TPDN to address abnormal muscle tension in glutes/piriformis; progress core/LE strengthening; balance training; potential trial of TENS unit if pain persisting, providing information on home unit if benefit noted  Elijah CHRISTELLA Hidden, PT 08/24/2024, 3:36 PM

## 2024-08-26 ENCOUNTER — Ambulatory Visit: Admitting: Physical Therapy

## 2024-08-26 ENCOUNTER — Other Ambulatory Visit: Payer: Self-pay | Admitting: General Practice

## 2024-08-26 DIAGNOSIS — E785 Hyperlipidemia, unspecified: Secondary | ICD-10-CM

## 2024-08-26 DIAGNOSIS — I2510 Atherosclerotic heart disease of native coronary artery without angina pectoris: Secondary | ICD-10-CM

## 2024-08-30 ENCOUNTER — Ambulatory Visit: Admitting: Physical Therapy

## 2024-08-30 ENCOUNTER — Encounter: Payer: Self-pay | Admitting: Physical Therapy

## 2024-08-30 DIAGNOSIS — M6281 Muscle weakness (generalized): Secondary | ICD-10-CM

## 2024-08-30 DIAGNOSIS — R2689 Other abnormalities of gait and mobility: Secondary | ICD-10-CM

## 2024-08-30 DIAGNOSIS — R252 Cramp and spasm: Secondary | ICD-10-CM

## 2024-08-30 DIAGNOSIS — M5416 Radiculopathy, lumbar region: Secondary | ICD-10-CM

## 2024-08-30 DIAGNOSIS — M25551 Pain in right hip: Secondary | ICD-10-CM

## 2024-08-30 DIAGNOSIS — R2681 Unsteadiness on feet: Secondary | ICD-10-CM

## 2024-08-30 NOTE — Therapy (Signed)
 " OUTPATIENT PHYSICAL THERAPY TREATMENT      Patient Name: [name] Trujillo MRN: [id number] DOB:07-24-1941, 83 y.o., female Today's Date: 08/30/2024  END OF SESSION:  PT End of Session - 08/30/24 1444     Visit Number 19    Date for Recertification  10/19/24    Authorization Type [employer]    Authorization Time Period 07/07/24 - 09/08/24    Authorization - Visit Number 9    Authorization - Number of Visits 10    Progress Note Due on Visit 28   Recert/PN on visit #18 (08/24/24)   PT Start Time 1444    PT Stop Time 1527    PT Time Calculation (min) 43 min    Activity Tolerance Patient tolerated treatment well    Behavior During Therapy [facility] for tasks assessed/performed             Past Medical History:  Diagnosis Date   High cholesterol    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   She had ACUTE TAKOTSUBO (STRESS) CARDIOMYOPATHY with elevated Troponin Levels - this would be considered Demand Ischemia - Demand Infarction & NOT associated with ACS/CAD.     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  - Cath with NO CAD. ==> RESOLVED: f/u Echo 03/2022: EF 60-65%.   Past Surgical History:  Procedure Laterality Date   APPEN[age]CTOMY     71-18 yo   BACK SURGERY     Age 40   CESAREAN SECTION     ECTOPIC PREGNANCY SURGERY     LAPAROSCOPIC HYSTERECTOMY     LEFT HEART CATH AND CORONARY ANGIOGRAPHY N/A 01/09/2022   Procedure: LEFT HEART CATH AND CORONARY ANGIOGRAPHY;  Surgeon: Wonda Sharper, MD;  Location: [facility] INVASIVE CV LAB;  Service: CV:: Widely patent coronaries with mild nonobstructive LAD plaquing.  Right dominant system.  Normal LVEDP.  Based on clinical presentation, findings  are consistent with acute Takotsubo Cardiomyopathy Syndrome   POSTERIOR FUSION LUMBAR SPINE  05/24/2022   [facility], [place]; [name], MD): L2-L3 XLIF, L2-L3 POSTERIOR DECOMPRESSION AND FUSION   TRANSTHORACIC ECHOCARDIOGRAM  01/08/2022   Severely decreased LV function-EF 25 to 30%.  Mid to apical (mostly anterior) with normal basal motion.  GR 2 DD-elevated LAP.  Mildly dilated LA.  Aortic sclerosis with no stenosis.  No AI.  Normal MV with mild to moderate TR.  Mildly elevated RAP, and PAP (estimated 49 mg). If LAD CAD ruled out - consistent with Takotsubo CM Syndrome.   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) 12/30/2023   New onset atrial fibrillation (HCC) 12/30/2023   Myopia of both eyes with astigmatism and presbyopia 05/06/2023   Dry eye syndrome of both eyes 05/06/2023   Mild tricuspid regurgitation 05/06/2022   Mild mitral regurgitation 05/06/2022   Hypertension 05/06/2022   History of seizure 05/06/2022  Takotsubo cardiomyopathy-resolved 01/10/2022   Hyperlipidemia with target LDL less than 100 01/10/2022   Hypothyroid 01/10/2022   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) 01/08/2022   Numbness in both legs 04/10/2021   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   Posterior vitreous detachment of both eyes 02/05/2018   Ventricular premature beats 10/31/2015   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   Pure hypercholesterolemia 06/28/2014   Plantar fasciitis 06/28/2014    PCP: Windy Coy, MD   REFERRING PROVIDER: Vernetta Lonni GRADE, MD  REFERRING DIAG:  [phone]  (ICD-10-CM) - Pain in right hip  M54.50 (ICD-10-CM) - Low back pain, unspecified  Eval and treat low back/right pelvis and right hip/ishium Any modalities per therapist   THERAPY DIAG:  Radiculopathy, lumbar region  Pain in right hip  Muscle weakness (generalized)  Cramp and spasm  Other abnormalities of gait and mobility  Unsteadiness on feet  RATIONALE FOR EVALUATION AND TREATMENT: Rehabilitation  ONSET DATE: Memorial Day weekend  NEXT MD VISIT:  09/20/24 with Lonni Vernetta, MD & 09/22/23? with [name], MD   SUBJECTIVE:                                                                                                                                                                                                         SUBJECTIVE STATEMENT: Pt reports she was feeling rough when she left after her last visit but this subsided in ~1 day.  She has been able to walk in her neighborhood some but ~1/4 of her normal distance.  Still having some intermittent buttock pain, but has also noted some pain across her low back.  EVAL: Raynell is well know to me from several prior PT episodes for foot drop, cervical and lumbar radiculopathy.  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  Woke up the next morning with severe R hip/buttock pain.  Pain mostly localized to sciatic and ischial area of her R buttock.  PCP provider told her it was likely hip bursitis but ortho MD feels like it is more likely radicular from her back.  Some relief from steroid injection around her right hip ischial area provided by ortho MD but pain has never fully gone away.   She does have a significant lurch in her gait which is somewhat more pronounced than on prior PT episodes with this PT.  PAIN: Are you having pain? Yes: NPRS scale: 0/10 at rest/sitting; <1/10 with standing or walking  Pain  location: R lower buttock  Pain  description: burning, numb pain  PERTINENT HISTORY:  L2-3 XLIF/PSF 05/24/22; C5-7 ACDF 09/21/20; idiopathic progressive neuropathy; hypthyroidism; DDD; remote h/o back surgery at age 63; h/o scoliosis; Takotsubo cardiomyopathy 01/10/22; CAD; paroxysmal afib; mitral and tricuspid regurgitation; myalgia due to statin; recent cataract surgery  PRECAUTIONS: Fall  RED FLAGS: Bowel or bladder incontinence: Yes: chronic bladder issues, bowel urgency - somewhat worse than before   WEIGHT BEARING RESTRICTIONS: No  FALLS:  Has patient fallen in last 6 months? No  LIVING ENVIRONMENT: Lives with: lives with their spouse Lives in: House/apartment Stairs: Yes: External: 5-6 steps; on left going up Has following equipment at home: Walker - 2 wheeled, shower chair, and hiking/walking poles  OCCUPATION: Retired runner, broadcasting/film/video  PLOF: Independent and Leisure: walking ~30 minutes 1x/day (3x around the court near where she lives); sewing/quilting; likes working in the yard    PATIENT GOALS: To start walking again w/o doing more damage.   OBJECTIVE: (objective measures completed at initial evaluation unless otherwise dated)  DIAGNOSTIC FINDINGS:  04/21/24 - XR RIGHT HIP: An AP pelvis and lateral of the right hip shows well-maintained joint space with no cortical irregularities around the hip or the trochanteric area of the hip.  The lower lumbar spine shows severe degenerative changes at L4-L5 and L5-S1 on the AP view.   08/27/22 - LUMBAR SPINE - COMPLETE 4+ VIEW FINDINGS: There is mild levoconvex curvature of the lumbar spine, unchanged. Alignment is anatomic. There are bilateral transpedicular screws in posterior fusion rods present at L2-L3, new from prior. Disc spacers present. Alignment is anatomic. No fractures are identified. There is moderate severe disc space narrowing at L4-L5 and L5-S1 as well as L1-L2 similar to the prior study. There are atherosclerotic calcifications of the aorta.    IMPRESSION: 1. Status post posterior fusion at L2-L3. Alignment is anatomic. 2. Multilevel degenerative disc disease, similar to prior study.  PATIENT SURVEYS:  Modified Oswestry:  MODIFIED OSWESTRY DISABILITY SCALE Date:  05/11/2024 07/06/24 08/24/2024   Pain intensity 2 =  Pain medication provides me with complete relief from pain. 2 0   2. Personal care (washing, dressing, etc.) 1 =  I can take care of myself normally, but it increases my pain. 2 0  3. Lifting 4 = I can lift only very light weights 4 2  4. Walking 1 = Pain prevents me from walking more than 1 mile. 3 3  5. Sitting 1 =  I can only sit in my favorite chair as long as I like. 1 1  6. Standing 2 =  Pain prevents me from standing more than 1 hour 3 1  7. Sleeping 2 =  Even when I take pain medication, I sleep less than 6 hours 1 1  8. Social Life 2 = Pain prevents me from participating in more energetic activities (eg. sports, dancing). 3 2  9. Traveling 2 =  My pain restricts my travel over 2 hours. 3 2  10. Employment/ Homemaking 2 = I can perform most of my homemaking/job duties, but pain prevents me from performing more physically stressful activities (eg, lifting, vacuuming). 2 2  Total 17 / 50 = 34.0 % 24 / 50 = 48.0 % 14 / 50 = 28.0 %  Interpretation Moderate disability Severe disability Moderate disability   Interpretation of scores: Score Category Description  0-20% Minimal Disability The patient can cope with most living activities. Usually no treatment is indicated apart from advice on lifting, sitting  and exercise  21-40% Moderate Disability The patient experiences more pain and difficulty with sitting, lifting and standing. Travel and social life are more difficult and they may be disabled from work. Personal care, sexual activity and sleeping are not grossly affected, and the patient can usually be managed by conservative means  41-60% Severe Disability Pain remains the main problem in this group, but activities  of daily living are affected. These patients require a detailed investigation  61-80% Crippled Back pain impinges on all aspects of the patients life. Positive intervention is required  81-100% Bed-bound These patients are either bed-bound or exaggerating their symptoms  Bluford FORBES Zoe DELENA Karon DELENA, et al. Surgery versus conservative management of stable thoracolumbar fracture: the PRESTO feasibility RCT. [place] ([place]): [employer]; 2021 Nov. [facility] Technology Assessment, No. 25.62.) Appendix 3, Oswestry Disability Index category descriptors. Available from: Findjewelers.cz Minimally Clinically Important Difference (MCID) = 12.8%  LEFS  Extreme difficulty/unable (0), Quite a bit of difficulty (1), Moderate difficulty (2), Little difficulty (3), No difficulty (4) Survey date:  05/11/24 07/06/24   Any of your usual work, housework or school activities 1 2  2. Usual hobbies, recreational or sporting activities 2 2  3. Getting into/out of the bath 2 2  4. Walking between rooms 2 4  5. Putting on socks/shoes 2 4  6. Squatting  0 2  7. Lifting an object, like a bag of groceries from the floor 2 3  8. Performing light activities around your home 2 2  9. Performing heavy activities around your home 0 1  10. Getting into/out of a car 2 1  11. Walking 2 blocks 0 1  12. Walking 1 mile 2 0  13. Going up/down 10 stairs (1 flight) 2 3  14. Standing for 1 hour 2 1  15.  sitting for 1 hour 3 4  16. Running on even ground 0 0  17. Running on uneven ground 0 0  18. Making sharp turns while running fast 0 0  19. Hopping  0 0  20. Rolling over in bed 2 3  Score total:   26 / 80 = 32.5 % 35 / 80 = 43.8 %  Functional limitation: Moderate Moderate     SCREENING FOR RED FLAGS: Bowel or bladder incontinence: Yes: chronic bladder issues, bowel urgency - somewhat worse than before Spinal tumors: No Cauda equina syndrome: No Compression fracture: No Abdominal  aneurysm: No  COGNITION:  Overall cognitive status: Within functional limits for tasks assessed    SENSATION: WFL Except B distal LE neuropathy  POSTURE:  rounded shoulders, forward head, right pelvic obliquity, and scoliosis  PALPATION: Increased muscle tension in L>R lumbar paraspinals and R>L glutes and piriformis with TTP over inferior glutes and piriformis near hamstring origin  LUMBAR ROM: *Lumbar ROM assessed with UE support for balance and close SBA/CGA of PT  Active  Eval  Flexion Hands to distal shins  Extension 50% limited  Right lateral flexion Hand to upper thigh - p!  Left lateral flexion Hand to lateral knee  Right rotation 50% limited  Left rotation 30% limited - p!  (Blank rows = not tested)  MUSCLE LENGTH: Hamstrings: mild/mod tight B ITB: mild tight B Piriformis: mod/severe tight B Hip IR: mod tight B Hip flexors: mild tight R Quads: mild tight R>L Heelcord: mild/mod tight B  LOWER EXTREMITY ROM:    Grossly WFL other than limitations indicated above due to muscle tightness   LOWER EXTREMITY MMT:  MMT Right eval Left eval R 07/06/24 L 07/06/24 R 08/24/24 L 08/24/24  Hip flexion 3+ 4- 4- 4 4 4+  Hip extension 4- 4 4 4 4  4+  Hip abduction 3+ 4- 4 4 4 4   Hip adduction 3+ 4- 4- 4 4- 4-  Hip internal rotation 4- 4- 4+ 4 4+ 4  Hip external rotation 3+ 3+ 4 4- 4 p! 4-  Knee flexion 4 4 4+ 4+ 5 5  Knee extension 4 4 4+ 4+ 5 5  Ankle dorsiflexion 3- 3- 3+ 3- 3 3-  Ankle plantarflexion        Ankle inversion        Ankle eversion         (Blank rows = not tested)  FUNCTIONAL TESTS: (Assessment completed 05/14/24 unless otherwise indicated) 5 times sit to stand: 12.37 sec w/o UE assist - 06/01/24 Timed up and go (TUG): 13.75 sec with single hiking pole 10 meter walk test: 16.00 sec with single hiking pole Gait speed: 2.05 ft/sec with single hiking pole Berg Balance Scale: 42/56; 37-45 significant (>80%) - 05/19/24 Functional gait assessment: 10/30, <  19 = high risk fall    Standardized testing results as of discharge from latest PT episode (11/11/23): 5xSTS = 11.84 sec  = 13.63 sec w/o AD Gait speed = 2.41 ft/sec w/o AD Berg = 45/56; scores of 37-45 indicate significant (>80%) fall risk  FGA = 19/30; scores of 19-24 indicate a medium risk for falls   GAIT: Distance walked: Clinic distances Assistive device utilized: Single walking/trekking pole on R Level of assistance: SBA Gait pattern: Increased sway, step through pattern, decreased stride length, trendelenburg, lateral hip instability, poor foot clearance- Right, and poor foot clearance- Left Comments: B foot slap   TODAY'S TREATMENT:    08/30/2024 THERAPEUTIC EXERCISE: To improve strength and endurance.   Rec Bike - L5 x 4', L4 x 2'  THERAPEUTIC ACTIVITIES: To improve functional performance.  Demonstration, verbal and tactile cues throughout for technique.  = 14.03 sec w/o AD, 13.10 sec w/ single trekking pole Gait speed = 2.34 ft/sec w/o AD,  2.50 ft/sec w/ single trekking pole  PHYSICAL PERFORMANCE TEST or MEASUREMENT: Berg Balance Test  Sit to Stand Able to stand without using hands and stabilize independently   Standing Unsupported Able to stand 30 seconds unsupported   1 min 21 sec  Sitting with Back Unsupported but Feet Supported on Floor or Stool Able to sit safely and securely 2 minutes   Stand to Sit Sits safely with minimal use of hands   Transfers Able to transfer safely, minor use of hands   Standing Unsupported with Eyes Closed Able to stand 3 seconds   Standing Unsupported with Feet Together Able to place feet together independently and stand 1 minute safely   From Standing, Reach Forward with Outstretched Arm Can reach forward >12 cm safely (5)   From Standing Position, Pick up Object from Floor Able to pick up shoe safely and easily   From Standing Position, Turn to Look Behind Over each Shoulder Needs supervision when turning   Turn 360  Degrees Able to turn 360 degrees safely one side only in 4 seconds or less   Standing Unsupported, Alternately Place Feet on Step/Stool Able to stand independently and complete 8 steps >20 seconds   Standing Unsupported, One Foot in [employer] balance while stepping or standing   Standing on One Leg Tries to lift leg/unable to hold 3 seconds  but remains standing independently   Total Score 39   Berg comment: 37-45 = Significant (>80%) fall risk      SELF CARE: Provided education to reduce fall risk. Verbally reviewed current HEP clarifying recommended frequency for ongoing performance to maintain gains achieved with PT and prevent future decline in function.    08/24/2024 - Recert THERAPEUTIC EXERCISE: To improve strength and endurance.   Rec Bike - L4 x 6'   THERAPEUTIC ACTIVITIES: To improve functional performance.  Demonstration, verbal and tactile cues throughout for technique. Modified Oswestry: 14 / 50 = 28.0 % LE MMT  PHYSICAL PERFORMANCE TEST or MEASUREMENT: Functional Gait  Assessment  Gait Level Surface Walks 20 ft, slow speed, abnormal gait pattern, evidence for imbalance or deviates 10-15 in outside of the 12 in walkway width. Requires more than 7 sec to ambulate 20 ft.   Change in Gait Speed Able to change speed, demonstrates mild gait deviations, deviates 6-10 in outside of the 12 in walkway width, or no gait deviations, unable to achieve a major change in velocity, or uses a change in velocity, or uses an assistive device.   Gait with Horizontal Head Turns Performs head turns with moderate changes in gait velocity, slows down, deviates 10-15 in outside 12 in walkway width but recovers, can continue to walk.   Gait with Vertical Head Turns Performs task with slight change in gait velocity (eg, minor disruption to smooth gait path), deviates 6 - 10 in outside 12 in walkway width or uses assistive device   Gait and Pivot Turn Pivot turns safely in greater than 3 sec and stops  with no loss of balance, or pivot turns safely within 3 sec and stops with mild imbalance, requires small steps to catch balance.   Step Over Obstacle Is able to step over one shoe box (4.5 in total height) without changing gait speed. No evidence of imbalance.   Gait with Narrow Base of Support Ambulates less than 4 steps heel to toe or cannot perform without assistance.   Gait with Eyes Closed Cannot walk 20 ft without assistance, severe gait deviations or imbalance, deviates greater than 15 in outside 12 in walkway width or will not attempt task.   Ambulating Backwards Walks 20 ft, uses assistive device, slower speed, mild gait deviations, deviates 6-10 in outside 12 in walkway width.   Steps Alternating feet, must use rail.   Total Score 14   FGA comment: < 19 = high risk fall      SELF CARE:  Review of progress with PT, status of goals, and plans for ongoing PT POC requesting patient input and feedback to ensure PT is addressing all of her concerns.    08/12/2024 THERAPEUTIC EXERCISE: To improve strength, endurance, ROM, and flexibility.  Demonstration, verbal and tactile cues throughout for technique.  Rec Bike - L4 x 6'  Supine R SKTC stretch x 30 Hooklying R KTOS piriformis stretch x 30 Hooklying R figure-4 piriformis stretch x 30 - patient reporting much better tolerance for this stretch after TPDN today than in recent attempts Hooklying R sciatic nerve glide 2 x 10  TRIGGER POINT DRY NEEDLING: Treatment instructions/education: Subsequent Treatment: Instructions provided previously at initial dry needling treatment.  Instructions reviewed, if requested by the patient, prior to subsequent dry needling treatment.  Education Handout Provided: Previously Provided Consent: Patient Verbal Consent Given: Yes Treatment: Muscles Treated: R glute medius and piriformis Utilized skilled palpation to identify bony landmarks and trigger points along with monitoring  of soft tissue during  DN. Electrical Stimulation Performed: No Treatment Response/Outcome: Twitch response elicited, Palpable increase in muscle length, Decreased tissue resistance noted, Decreased pain/TTP, and Improved exercise tolerance  MANUAL THERAPY: To promote normalized muscle tension, improved flexibility, improved joint mobility, increased ROM, pain modulation, and reduced pain. STM/DTM, manual TPR and pin & stretch to muscles addressed with DN   SELF CARE: Provided education for HEP review including recommended frequency for home performance.  Verbally reviewed and consolidated remainder of current HEP, removing some of the more triggering exercises and clarifying recommended frequency for ongoing performance of HEP, emphasizing avoiding pushing any exercises or stretches into painful range and avoiding trying to push exercises past the point of fatigue.  Discussed recent increasing variability in pain and limited tolerance for PT as well as status in current POC with recertification due by 08/31/2024 however only 1 more [facility] Medicare visit currently authorized.  Explained that limited progress may indicate a need for transition to HEP pending MRI and follow-up consultations with orthopedic and neurosurgery, with full determination of plan TBD pending goal assessment and assessment response to TPDN.   08/10/2024  THERAPEUTIC EXERCISE: To improve strength, endurance, ROM, and flexibility.  Demonstration, verbal and tactile cues throughout for technique.  Rec Bike - L4 x 6' Seated 3-way lumbar flexion stretch with UE support on hiking pole 3 x 30 - center and L stretch triggering R anterolateral thigh pain, no radicular pain with R-sided stretch Standing lateral lean ITB/QL stretch holding onto wall ladder 2 x 30, 2nd rep adding slight trunk rotation toward ladder Standing lumbar flexion stretch holding onto wall ladder 2 x 30 leaning straight back 2 x 30 with slight shift of hips to R  THERAPEUTIC  ACTIVITIES: To improve functional performance.  Demonstration, verbal and tactile cues throughout for technique. Standing R hip extension diagonal with L foot on 2 block x 5 - mod R hip pain - discontinued d/t increased pain Standing L hip extension diagonal with R foot on 2 block x 5 - mod/severe increased R hip pain with more limited ROM and increased difficulty maintaining level pelvis - discontinued Mini squat x 5 - discontinued d/t increased pain Supported squat (re-attempted after stretches w/o increased pain) x 10  MANUAL THERAPY: To promote improved flexibility, pain modulation, and reduced pain utilizing manual TP therapy.  Attempted self-STM/release to R glutes with lacrosse ball on wall but pt reporting increased pain and not the normal release/relief that she typically experiences with this.   07/29/2024  THERAPEUTIC EXERCISE: To improve strength, endurance, and flexibility.   Rec Bike - L4 x 6'  SELF CARE:   Answered patient's questions and provided education regarding what MRI can determine, explaining difference between spinal canal stenosis and foraminal stenosis which are both present on her past MRIs and how her current symptoms correlate.  NEUROMUSCULAR RE-EDUCATION: To improve balance, coordination, kinesthesia, posture, proprioception, and reduce fall risk. Corner balance progression with narrow BOS on firm surface with arms crossed on chest            Eyes open: static stance 3 x 30 sec horiz head turns x 5 vertical head nods x 5 Pt noted to center majority of weight on L LE   THERAPEUTIC ACTIVITIES: To improve functional performance.  Demonstration, verbal and tactile cues throughout for technique. Standing R hip extension diagonal with L foot on 2 block x 10 - mild to no increased R hip pain Standing L hip extension diagonal with R foot  on 2 block x 10 - mod increased R hip pain with more limited ROM and increased difficulty maintaining level pelvis Standing L  hip hike with R foot on 2 block x 5 - limited by increased pain   07/27/2024  THERAPEUTIC EXERCISE: To improve strength and endurance.   Rec Bike - L4 x 4', L3 x 2'  PHYSICAL PERFORMANCE TEST or MEASUREMENT: Berg Balance Test  Sit to Stand Able to stand without using hands and stabilize independently   Standing Unsupported Able to stand safely 2 minutes   Sitting with Back Unsupported but Feet Supported on Floor or Stool Able to sit safely and securely 2 minutes   Stand to Sit Sits safely with minimal use of hands   Transfers Able to transfer safely, minor use of hands   Standing Unsupported with Eyes Closed Able to stand 3 seconds   Standing Unsupported with Feet Together Able to place feet together independently but unable to hold for 30 seconds   From Standing, Reach Forward with Outstretched Arm Can reach forward >12 cm safely (5)   From Standing Position, Pick up Object from Floor Able to pick up shoe safely and easily   From Standing Position, Turn to Look Behind Over each Shoulder Turn sideways only but maintains balance   Turn 360 Degrees Able to turn 360 degrees safely one side only in 4 seconds or less   Standing Unsupported, Alternately Place Feet on Step/Stool Able to stand independently and complete 8 steps >20 seconds   Standing Unsupported, One Foot in [employer] balance while stepping or standing   Standing on One Leg Tries to lift leg/unable to hold 3 seconds but remains standing independently   Total Score 40   Berg comment: 37-45 = Significant (>80%) fall risk       THERAPEUTIC ACTIVITIES: To improve functional performance.  Demonstration, verbal and tactile cues throughout for technique.  = 13.56 sec with single trekking pole, 13.10 sec w/o AD Gait speed = 2.42 ft/sec with single trekking pole, 2.50 ft/sec w/o AD Goal assessment   07/15/2024 THERAPEUTIC EXERCISE: To improve strength and endurance.    Rec Bike - L4 x 6 min  THERAPEUTIC ACTIVITIES: To  improve functional performance.  Demonstration, verbal and tactile cues throughout for technique. Standing R hip hike with L foot on 2 block x 10 Standing L hip hike with R foot on 2 block x 5 - limited by increased pain Standing R hip extension diagonal with L foot on 2 block x 10 - mild increased R hip pain Standing L hip extension diagonal with R foot on 2 block x 10 - mod increased R hip pain with more limited ROM Standing R hip extension diagonal with L foot on 2 block and torso leaning on pillow + white wedge on elevated mat table x 10 - less pain noted Standing L hip extension diagonal with R foot on 2 block and torso leaning on pillow + white wedge on elevated mat table x 10 - less pain noted  Eccentric squat touch to elevated mat table with UE support on back of chair x 10 Lateral step & lift over cone x 10 bil (simulating stepping over edge of tub) - mild increased pain in R buttock with L step-over   07/13/2024  THERAPEUTIC EXERCISE: To improve strength, endurance, ROM, and flexibility.  Demonstration, verbal and tactile cues throughout for technique.  Rec Bike - L4 x 4 min, L3 x 2 min Manual R hip  flexor stretch x 30 Manual R TFL stretch x 30 Manual R ITB stretch x 30 S/L RTB clam x 10 bil Bridge + RTB hip ABD isometric - deferred due to increased R buttock and LE radicular pain Bridge + manual PT resisted hip ABD isometric - deferred due to increased R buttock and LE radicular pain Bridge + hip ADD ball squeeze isometric - deferred due to increased R buttock and LE radicular pain  MANUAL THERAPY: To promote movement utilizing connective tissue massage, therapeutic massage, manual TP therapy, and myofascial release. STM/DTM, manual TPR to R distal glutes, piriformis, TFL, and ITB  THERAPEUTIC ACTIVITIES: To improve functional performance.  Demonstration, verbal and tactile cues throughout for technique. Standing hip extension diagonal with looped RTB at distal thighs x  8-10 bil (limited due to fatigue) - added to HEP without RTB Standing L/R hip hike with R foot on 2 block 2 x 10 Standing L/R hip circumduction with opp foot standing on 2 block - focusing on maintaining level pelvis x ~10 each side   07/08/2024  THERAPEUTIC EXERCISE: To improve strength, endurance, ROM, and flexibility.  Demonstration, verbal and tactile cues throughout for technique.  Rec Bike - L4 x 3 min, L3 x 3 min - resistance level reduced due to increasing fatigue Supine manual stretches: R hamstring stretch x 30 R mod Thomas quad/hip flexor stretch x 30 R glute cross body stretch x 30 R ITB cross body stretch x 30 R KTOS piriformis stretch x 30 R figure-4 to chest piriformis stretch x 30  NEUROMUSCULAR RE-EDUCATION: To improve coordination, kinesthesia, and proprioception.  Hooklying R sciatic nerve glide x 10 - patient reporting feeling of less motion at toes during nerve glide Supine/long sitting R isometric toe curls into therapist's hand x 10 Attempted long sitting R toe curls into YTB however patient unable to create AROM against resistance band  MANUAL THERAPY: To promote normalized muscle tension, improved flexibility, and reduced pain utilizing connective tissue massage, therapeutic massage, manual TP therapy, and myofascial release.  STM/DTM, manual TPR, and percussive massage with massage gun to R distal glutes, piriformis, TFL, distal iliopsoas, vastus lateralis and ITB  MODALITIES: (non-billable) Moist heat pack to R glutes x 10' at end of session   07/06/2024 - Recert THERAPEUTIC EXERCISE: To improve strength and endurance.   Rec Bike - L4 x 3 min, L3 x 3 min - resistance level reduced due to increasing fatigue  THERAPEUTIC ACTIVITIES: To improve functional performance.  Demonstration, verbal and tactile cues throughout for technique.  LE MMT - see above LE MMT table LEFS: 35/80 = 43.8% indicating moderate functional limitation Modified Oswestry: 24/50  = 48% or severe disability Gait speed = 2.08 ft/sec with single hiking pole Goal assessment  PHYSICAL PERFORMANCE TEST or MEASUREMENT: Functional Gait  Assessment  Gait Level Surface Walks 20 ft, slow speed, abnormal gait pattern, evidence for imbalance or deviates 10-15 in outside of the 12 in walkway width. Requires more than 7 sec to ambulate 20 ft.   Change in Gait Speed Makes only minor adjustments to walking speed, or accomplishes a change in speed with significant gait deviations, deviates 10-15 in outside the 12 in walkway width, or changes speed but loses balance but is able to recover and continue walking.   Gait with Horizontal Head Turns Performs head turns with moderate changes in gait velocity, slows down, deviates 10-15 in outside 12 in walkway width but recovers, can continue to walk.   Gait with Vertical Head Turns  Performs task with slight change in gait velocity (eg, minor disruption to smooth gait path), deviates 6 - 10 in outside 12 in walkway width or uses assistive device   Gait and Pivot Turn Pivot turns safely in greater than 3 sec and stops with no loss of balance, or pivot turns safely within 3 sec and stops with mild imbalance, requires small steps to catch balance.   Step Over Obstacle Is able to step over one shoe box (4.5 in total height) without changing gait speed. No evidence of imbalance.   Gait with Narrow Base of Support Ambulates less than 4 steps heel to toe or cannot perform without assistance.   Gait with Eyes Closed Cannot walk 20 ft without assistance, severe gait deviations or imbalance, deviates greater than 15 in outside 12 in walkway width or will not attempt task.   Ambulating Backwards Walks 20 ft, slow speed, abnormal gait pattern, evidence for imbalance, deviates 10-15 in outside 12 in walkway width.   Steps Alternating feet, must use rail.   Total Score 12   FGA comment: < 19 = high risk fall         PATIENT EDUCATION:  Education details:  standardized testing results and interpretation, progress with PT, ongoing PT POC, and continue with current HEP  Person educated: Patient Education method: Explanation Education comprehension: verbalized understanding  HOME EXERCISE PROGRAM: Access Code: [facility] URL: [email or web address] Date: 08/12/2024 Prepared by: Elijah Hidden  Exercises - Standing Soleus Stretch  - 1 x daily - 7 x weekly - 3 sets - 3 reps - 30 sec hold - Hooklying Hamstring Stretch with Strap  - 2 x daily - 7 x weekly - 3 reps - 30 sec hold - Supine Gluteus Stretch  - 2 x daily - 7 x weekly - 3 reps - 30 sec hold - Supine Figure 4 Piriformis Stretch  - 2 x daily - 7 x weekly - 3 reps - 30 sec hold - Supine Piriformis Stretch with Foot on Ground  - 2 x daily - 7 x weekly - 3 reps - 30 sec hold - Seated Table Piriformis Stretch  - 2 x daily - 7 x weekly - 3 reps - 30 sec hold - Seated Piriformis Stretch  - 2 x daily - 7 x weekly - 3 reps - 30 sec hold - Seated March with Resistance  - 1 x daily - 7 x weekly - 2 sets - 10 reps - Seated Isometric Hip Abduction with Resistance  - 1 x daily - 7 x weekly - 2 sets - 10 reps - Glute Max Release With [employer] Against Wall  - 1 x daily - 7 x weekly - 1-2 min hold - Mini Squat with Counter Support  - 1 x daily - 3 x weekly - 2 sets - 10 reps - 3-5 sec hold - Supine Iliotibial Band Stretch with Strap  - 2 x daily - 7 x weekly - 3 reps - 30 sec hold - Sidelying Diagonal Hip Abduction  - 1 x daily - 3 x weekly - 2 sets - 10 reps - 3 sec hold - Seated 3 Way Exercise Ball Roll Out Stretch  - 1 x daily - 7 x weekly - 2 sets - 3 reps - 30 sec hold - Supine Sciatic Nerve Glide  - 2-3 x daily - 7 x weekly - 2 sets - 10 reps - 3 sec hold - Hip Hiking on Step  - 1 x daily -  3 x weekly - 2 sets - 10 reps - 3 sec hold - Diagonal Hip Extension  - 1 x daily - 3 x weekly - 2 sets - 10 reps - 3 sec hold  Patient Education - Lifting Techniques   ASSESSMENT:  CLINICAL  IMPRESSION: Avry reports reports her buttock pain has been better since the TPDN but still experiences random instances of pain occasionally upon initiation of walking.  She states she has been able to try walking in her neighborhood 2 times recently but continues to be limited to approximately 1/4 of her normal distances.  She notes increased difficulty with static stance activities such as getting dressed and performing ADLs therefore reassessed Berg with slight decline noted from 40/56 to 39/56, with current score still indicating a significant (>80%) risk for falls.  Gait speed essentially on par with most recent assessment, however speed to today better with use of trekking pole where last time it was better w/o her AD.  Kindell take a break from PT to focus on her HEP until her MRI and her f/u's with Dr. Vernetta and Dr. Emmitt but would like to remain on hold for 30 days in the event that her pain would flare back up and she would need to try the TPDN again or if either MD would want her to resume PT.  We verbally reviewed her HEP and discussed ongoing performance of strengthening and balance exercises as well as continued attempts at walking for exercise to help prevent decline in function, with patient verbalizing understanding of recommendations.   EVAL: Nimrit Tinnell is a 83 y.o. female who was referred to physical therapy for evaluation and treatment for R hip/buttock pain presumed to be the result of lumbar radiculopathy.  Patient reports onset of R buttock pain beginning Memorial Day weekend 2025.  Pain is worse with walking and positional changes.  Patient has deficits in hip ROM, proximal LE flexibility, B LE strength, abnormal posture, and TTP with abnormal muscle tension in R lower glutes and piriformis which are interfering with ADLs and are impacting quality of life.  On Modified Oswestry patient scored 17/50 demonstrating 34% or moderate disability.  On LEFS patient scored 26/80  demonstrating moderate functional limitation.  Given her significant LE weakness and impaired gait pattern, will plan more formalized balance testing next visit.  [facility] will benefit from skilled PT to address above deficits to improve mobility and activity tolerance with decreased pain interference.     OBJECTIVE IMPAIRMENTS: Abnormal gait, decreased activity tolerance, decreased balance, decreased knowledge of condition, decreased mobility, difficulty walking, decreased ROM, decreased strength, increased fascial restrictions, impaired perceived functional ability, increased muscle spasms, impaired flexibility, impaired sensation, improper body mechanics, postural dysfunction, and pain.   ACTIVITY LIMITATIONS: carrying, lifting, bending, standing, squatting, sleeping, stairs, transfers, bed mobility, bathing, toileting, dressing, reach over head, hygiene/grooming, locomotion level, and caring for others  PARTICIPATION LIMITATIONS: meal prep, cleaning, laundry, driving, shopping, community activity, yard work, and church  PERSONAL FACTORS: Age, Past/current experiences, Time since onset of injury/illness/exacerbation, and 3+ comorbidities: L2-3 XLIF/PSF 05/24/22; C5-7 ACDF 09/21/20; idiopathic progressive neuropathy; hypthyroidism; DDD; remote h/o back surgery at age 77; h/o scoliosis; 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   REHAB POTENTIAL: Good  CLINICAL DECISION MAKING: Unstable/unpredictable  EVALUATION COMPLEXITY: High   GOALS: Goals reviewed with patient? Yes  SHORT TERM GOALS: Target date: 06/15/2024  Patient will be independent  with initial HEP to improve outcomes and carryover.  Baseline: Initial HEP provided on eval Goal status: MET - 05/19/24  2.  Patient will report 25% improvement in low back pain to improve QOL. Baseline: 2/10 on  eval, up to 10/10 06/03/24 - improvement in pain noted by end of today's session Goal status: MET - 06/08/24 - Pt reports 50+% improvement in pain thus far  3.  Complete standardized balance assessment and update LTG's as indicated. Baseline:  05/14/24 - , TUG and FGA completed; Berg and 5xSTS pending  05/19/24 GLENWOOD Levins completed Goal status: MET - 06/01/24 - 5xSTS completed  LONG TERM GOALS: Target date: 07/20/2024, extended to 08/31/2024  Patient will be independent with ongoing/advanced HEP for self-management at home.  Baseline:  06/22/24 - HEP reviewed today 07/06/24 - no concerns with current HEP 08/10/24 - Encouraged patient to continue to work on HEP without increased pain as tolerated Goal status: PARTIALLY MET - 08/30/24 - met for current HEP   2.  Patient will report 50-75% improvement in R hip/pelvic pain to improve QOL.  Baseline: 2/10 on eval, up to 10/10 06/08/24 - Pt reports 50+% improvement in pain thus far 07/06/24 - 50-60% improvement overall, but still having periods of more intense pain 07/27/24 - still at least 50% improvement overall, but pain still fluctuating  08/10/24 - pain still fluctuating, but slightly worse after increased activity over Thanksgiving holiday Goal status: IN PROGRESS - 08/30/24 - R hip/buttock pain remains better since TPDN but still variable, patient now noting some discomfort across B low back  3.  Patient will demonstrate improved B proximal LE strength to >/= 4 to 4+/5 for improved stability and ease of mobility. Baseline: Refer to above LE MMT table 07/06/24 Goal status: IN PROGRESS - 08/24/25 - B hips 4- to 4+/5, B knees 5/5, B ankle DF 3- to 3/5  4.  Patient will report >/= 35/80 on LEFS (MCID = 9 pts) to demonstrate improved functional ability. Baseline: 26 / 80 = 32.5 % Goal status: MET - 07/06/24 - 35 / 80 = 43.8 %  5. Patient will report </= 22% on Modified Oswestry (MCID = 12%) to demonstrate improved functional ability with  decreased pain interference. Baseline: 17 / 50 = 34.0 % 07/06/24 - 24 / 50 = 48.0 % Goal status: IN PROGRESS - 08/24/24 - 14 / 50 = 28.0 % - improving  6.  Patient will resume her normal walking routine w/o increased pain to allow for  improved mobility and activity tolerance. Baseline: R ischial pain limiting walking tolerance 07/06/24 - attempted to try walking in her neighborhood last week but experienced increased pain while trying to walk up a incline that caused her to have to stop and return home  07/27/24 - patient reports she has been walking ~1/4 of her normal distance staying on level ground and not walking nearly as often as normal (was usually walking daily) Goal status: IN PROGRESS - 08/30/24 - patient reports she still has been walking ~1/4 of her normal distance and staying on level ground, but has only been able to do these walks x 2 days recently (was usually walking daily)  7.  Patient will improve Berg score by at least 8 points or to >/= 45/56 (baseline as of prior D/C from PT) to improve safety and stability with ADLs in standing and reduce risk for falls.  Baseline: 42/56 (05/19/24) 07/27/24 - individual item scores fluctuating but overall score decreased by 2 points to  40/56 Goal status: IN PROGRESS - 08/30/24 - slight decline to 39/56  8.  Patient will improve FGA score by at least 4 points or to >/= 19/30 (baseline as of prior D/C from PT) to improve gait stability and reduce risk for falls.   Baseline: 10/30 (05/14/24) 07/06/24 - 12/30 Goal status: PARTIALLY MET - 08/24/24 - 14/30  9.  Patient will improve gait velocity to at least 2.62 ft/sec for improved gait efficiency and safety with community ambulation.  Baseline: 2.05 ft/sec with single hiking pole (05/14/24) 07/06/24 - 2.08 ft/sec with single hiking pole 07/27/24 - 2.42 ft/sec with single trekking pole, 2.50 ft/sec w/o AD Goal status: IN PROGRESS - 08/30/24 - 2.34 ft/sec w/o AD,  2.50 ft/sec w/ single trekking  pole   PLAN:  PT FREQUENCY: 2x/week  PT DURATION: 8 weeks  PLANNED INTERVENTIONS: 97164- PT Re-evaluation, 97750- Physical Performance Testing, 97110-Therapeutic exercises, 97530- Therapeutic activity, 97112- Neuromuscular re-education, 97535- Self Care, [id number]- Manual therapy, [id number]- Gait training, [phone]- Electrical stimulation (unattended), 97035- Ultrasound, 02966- Ionotophoresis 4mg /ml Dexamethasone, 79439 (1-2 muscles), 20561 (3+ muscles)- Dry Needling, Patient/Family education, Balance training, Stair training, Taping, Joint mobilization, DME instructions, Cryotherapy, and Moist heat  PLAN FOR NEXT SESSION: 30-day hold pending lumbar MRI and follow-up with orthopedic and neurosurgeons; if patient were to return to PT: MT +/- TPDN to address abnormal muscle tension in glutes/piriformis; progress core/LE strengthening; balance training; potential trial of TENS unit if pain persisting, providing information on home unit if benefit noted  Elijah CHRISTELLA Hidden, PT 08/30/2024, 3:35 PM  "

## 2024-09-07 ENCOUNTER — Ambulatory Visit
Admission: RE | Admit: 2024-09-07 | Discharge: 2024-09-07 | Disposition: A | Discharge status: Home / Self Care | Source: Ambulatory Visit | Attending: Orthopaedic Surgery | Admitting: Orthopaedic Surgery

## 2024-09-07 DIAGNOSIS — M25551 Pain in right hip: Secondary | ICD-10-CM

## 2024-09-07 DIAGNOSIS — G8929 Other chronic pain: Secondary | ICD-10-CM

## 2024-09-20 ENCOUNTER — Telehealth: Payer: Self-pay | Admitting: Orthopaedic Surgery

## 2024-09-20 ENCOUNTER — Ambulatory Visit: Admitting: Orthopaedic Surgery

## 2024-09-20 DIAGNOSIS — M5441 Lumbago with sciatica, right side: Secondary | ICD-10-CM

## 2024-09-20 DIAGNOSIS — G8929 Other chronic pain: Secondary | ICD-10-CM | POA: Diagnosis not present

## 2024-09-20 NOTE — Progress Notes (Signed)
 Patient comes in today to go over the MRI of her lumbar spine.  Since I saw her last she said that the ischial pain has improved quite a bit with physical therapy and dry needling.  There were some physical therapy motions that were making things worse but then they stopped doing those type of stretching exercises and worked on dry needling and she said that is calm down things a lot.  She has had previous lumbar spine surgery with an instrumented fusion at L2-L3 and then previous noninstrumented surgery at the lower lumbar spine done by 2 different surgeons years ago.  She said things are much better now than when we sent her for the MRI.  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  It is moderate.  I did give her a copy of her MRI findings.  On exam today she still has a slightly positive straight leg raise to the right side but there is no pain of the trochanteric area of her hip and her hip moves smoothly and some mild pain over the ischium.  Since she is doing much better overall we will just watch this conservatively.  The next step would be consider sending her to Dr. Eldonna for an epidural steroid injection.  Even though her chart lists that she is on Eliquis  she is no longer on blood thinning medication.  If things worsen and she would like to be set up for an epidural steroid injection she will give us  a call.

## 2024-09-20 NOTE — Telephone Encounter (Signed)
 Pt called stating she has an appt to go over MRI but her husband that is bring her does feel well and asked if she can get over the phone results. Explained to pt that provider does not do over the phone results but I will inform Vernetta to see what he wants to do. Pt number is [phone].

## 2024-09-21 ENCOUNTER — Ambulatory Visit: Admitting: Neurological Surgery

## 2024-09-25 ENCOUNTER — Observation Stay (HOSPITAL_COMMUNITY)

## 2024-09-25 ENCOUNTER — Emergency Department (HOSPITAL_COMMUNITY)

## 2024-09-25 ENCOUNTER — Observation Stay (HOSPITAL_COMMUNITY)
Admission: EM | Admit: 2024-09-25 | Discharge: 2024-09-26 | Disposition: A | Attending: Family Medicine | Admitting: Family Medicine

## 2024-09-25 ENCOUNTER — Other Ambulatory Visit: Payer: Self-pay

## 2024-09-25 ENCOUNTER — Encounter (HOSPITAL_COMMUNITY): Payer: Self-pay

## 2024-09-25 DIAGNOSIS — R479 Unspecified speech disturbances: Secondary | ICD-10-CM | POA: Insufficient documentation

## 2024-09-25 DIAGNOSIS — G459 Transient cerebral ischemic attack, unspecified: Secondary | ICD-10-CM | POA: Diagnosis not present

## 2024-09-25 DIAGNOSIS — R42 Dizziness and giddiness: Secondary | ICD-10-CM | POA: Diagnosis not present

## 2024-09-25 DIAGNOSIS — I5032 Chronic diastolic (congestive) heart failure: Secondary | ICD-10-CM | POA: Insufficient documentation

## 2024-09-25 DIAGNOSIS — E785 Hyperlipidemia, unspecified: Secondary | ICD-10-CM | POA: Insufficient documentation

## 2024-09-25 DIAGNOSIS — E039 Hypothyroidism, unspecified: Secondary | ICD-10-CM | POA: Diagnosis not present

## 2024-09-25 DIAGNOSIS — Z79899 Other long term (current) drug therapy: Secondary | ICD-10-CM | POA: Insufficient documentation

## 2024-09-25 DIAGNOSIS — I159 Secondary hypertension, unspecified: Secondary | ICD-10-CM | POA: Diagnosis not present

## 2024-09-25 DIAGNOSIS — I480 Paroxysmal atrial fibrillation: Secondary | ICD-10-CM | POA: Diagnosis present

## 2024-09-25 DIAGNOSIS — H532 Diplopia: Secondary | ICD-10-CM | POA: Diagnosis present

## 2024-09-25 DIAGNOSIS — I110 Hypertensive heart disease with heart failure: Secondary | ICD-10-CM | POA: Insufficient documentation

## 2024-09-25 DIAGNOSIS — I5020 Unspecified systolic (congestive) heart failure: Secondary | ICD-10-CM | POA: Diagnosis not present

## 2024-09-25 DIAGNOSIS — I10 Essential (primary) hypertension: Secondary | ICD-10-CM | POA: Diagnosis present

## 2024-09-25 LAB — URINE DRUG SCREEN
Amphetamines: NEGATIVE
Barbiturates: NEGATIVE
Benzodiazepines: NEGATIVE
Cocaine: NEGATIVE
Fentanyl: NEGATIVE
Methadone Scn, Ur: NEGATIVE
Opiates: NEGATIVE
Tetrahydrocannabinol: NEGATIVE

## 2024-09-25 LAB — CBC
HCT: 42.6 % (ref 36.0–46.0)
Hemoglobin: 14 g/dL (ref 12.0–15.0)
MCH: 32.2 pg (ref 26.0–34.0)
MCHC: 32.9 g/dL (ref 30.0–36.0)
MCV: 97.9 fL (ref 80.0–100.0)
Platelets: 297 K/uL (ref 150–400)
RBC: 4.35 MIL/uL (ref 3.87–5.11)
RDW: 11.5 % (ref 11.5–15.5)
WBC: 7.3 K/uL (ref 4.0–10.5)
nRBC: 0 % (ref 0.0–0.2)

## 2024-09-25 LAB — URINALYSIS, ROUTINE W REFLEX MICROSCOPIC
Bilirubin Urine: NEGATIVE
Glucose, UA: NEGATIVE mg/dL
Ketones, ur: 5 mg/dL — AB
Nitrite: NEGATIVE
Protein, ur: NEGATIVE mg/dL
Specific Gravity, Urine: 1.004 — ABNORMAL LOW (ref 1.005–1.030)
pH: 7 (ref 5.0–8.0)

## 2024-09-25 LAB — COMPREHENSIVE METABOLIC PANEL WITH GFR
ALT: 8 U/L (ref 0–44)
AST: 16 U/L (ref 15–41)
Albumin: 4.3 g/dL (ref 3.5–5.0)
Alkaline Phosphatase: 80 U/L (ref 38–126)
Anion gap: 13 (ref 5–15)
BUN: 17 mg/dL (ref 8–23)
CO2: 22 mmol/L (ref 22–32)
Calcium: 9.9 mg/dL (ref 8.9–10.3)
Chloride: 103 mmol/L (ref 98–111)
Creatinine, Ser: 0.86 mg/dL (ref 0.44–1.00)
GFR, Estimated: >60 mL/min
Glucose, Bld: 118 mg/dL — ABNORMAL HIGH (ref 70–99)
Potassium: 4.2 mmol/L (ref 3.5–5.1)
Sodium: 138 mmol/L (ref 135–145)
Total Bilirubin: 0.4 mg/dL (ref 0.0–1.2)
Total Protein: 6.9 g/dL (ref 6.5–8.1)

## 2024-09-25 LAB — DIFFERENTIAL
Abs Immature Granulocytes: 0.02 K/uL (ref 0.00–0.07)
Basophils Absolute: 0.1 K/uL (ref 0.0–0.1)
Basophils Relative: 1 %
Eosinophils Absolute: 0.1 K/uL (ref 0.0–0.5)
Eosinophils Relative: 2 %
Immature Granulocytes: 0 %
Lymphocytes Relative: 27 %
Lymphs Abs: 2 K/uL (ref 0.7–4.0)
Monocytes Absolute: 0.6 K/uL (ref 0.1–1.0)
Monocytes Relative: 8 %
Neutro Abs: 4.5 K/uL (ref 1.7–7.7)
Neutrophils Relative %: 62 %

## 2024-09-25 LAB — I-STAT CHEM 8, ED
BUN: 18 mg/dL (ref 8–23)
Calcium, Ion: 1.21 mmol/L (ref 1.15–1.40)
Chloride: 105 mmol/L (ref 98–111)
Creatinine, Ser: 0.9 mg/dL (ref 0.44–1.00)
Glucose, Bld: 104 mg/dL — ABNORMAL HIGH (ref 70–99)
HCT: 41 % (ref 36.0–46.0)
Hemoglobin: 13.9 g/dL (ref 12.0–15.0)
Potassium: 4.1 mmol/L (ref 3.5–5.1)
Sodium: 141 mmol/L (ref 135–145)
TCO2: 22 mmol/L (ref 22–32)

## 2024-09-25 LAB — ETHANOL: Alcohol, Ethyl (B): <15 mg/dL

## 2024-09-25 LAB — CBG MONITORING, ED: Glucose-Capillary: 117 mg/dL — ABNORMAL HIGH (ref 70–99)

## 2024-09-25 LAB — HEMOGLOBIN A1C
Hgb A1c MFr Bld: 5.5 % (ref 4.8–5.6)
Mean Plasma Glucose: 111.15 mg/dL

## 2024-09-25 LAB — TSH: TSH: 0.489 u[IU]/mL (ref 0.350–4.500)

## 2024-09-25 LAB — PROTIME-INR
INR: 0.9 (ref 0.8–1.2)
Prothrombin Time: 13 s (ref 11.4–15.2)

## 2024-09-25 LAB — APTT: aPTT: 25 s (ref 24–36)

## 2024-09-25 MED ORDER — ENOXAPARIN SODIUM 40 MG/0.4ML IJ SOSY
40.0000 mg | PREFILLED_SYRINGE | INTRAMUSCULAR | Status: DC
  Administered 2024-09-26: 40 mg via SUBCUTANEOUS
  Filled 2024-09-25: qty 0.4

## 2024-09-25 MED ORDER — HYDRALAZINE HCL 20 MG/ML IJ SOLN: 10.0000 mg | INTRAMUSCULAR | Status: DC | PRN

## 2024-09-25 MED ORDER — ONDANSETRON HCL 4 MG/2ML IJ SOLN: 4.0000 mg | Freq: Four times a day (QID) | INTRAMUSCULAR | Status: DC | PRN

## 2024-09-25 MED ORDER — IOHEXOL 350 MG/ML SOLN
75.0000 mL | Freq: Once | INTRAVENOUS | Status: AC | PRN
  Administered 2024-09-25: 75 mL via INTRAVENOUS

## 2024-09-25 MED ORDER — ACETAMINOPHEN 325 MG PO TABS: 650.0000 mg | ORAL_TABLET | Freq: Four times a day (QID) | ORAL | Status: DC | PRN

## 2024-09-25 MED ORDER — STROKE: EARLY STAGES OF RECOVERY BOOK
Freq: Once | Status: AC
  Filled 2024-09-25: qty 1

## 2024-09-25 MED ORDER — ASPIRIN 81 MG PO TBEC
81.0000 mg | DELAYED_RELEASE_TABLET | Freq: Every day | ORAL | Status: DC
  Administered 2024-09-25: 81 mg via ORAL
  Filled 2024-09-25 (×2): qty 1

## 2024-09-25 MED ORDER — CLOPIDOGREL BISULFATE 75 MG PO TABS
75.0000 mg | ORAL_TABLET | Freq: Every day | ORAL | Status: DC
  Administered 2024-09-25: 75 mg via ORAL
  Filled 2024-09-25 (×2): qty 1

## 2024-09-25 MED ORDER — SODIUM CHLORIDE 0.9% FLUSH
3.0000 mL | Freq: Once | INTRAVENOUS | Status: AC
  Administered 2024-09-25: 3 mL via INTRAVENOUS

## 2024-09-25 MED ORDER — ACETAMINOPHEN 650 MG RE SUPP: 650.0000 mg | Freq: Four times a day (QID) | RECTAL | Status: DC | PRN

## 2024-09-25 MED ORDER — ONDANSETRON HCL 4 MG PO TABS: 4.0000 mg | ORAL_TABLET | Freq: Four times a day (QID) | ORAL | Status: DC | PRN

## 2024-09-25 MED ORDER — LEVOTHYROXINE SODIUM 88 MCG PO TABS
88.0000 ug | ORAL_TABLET | Freq: Every day | ORAL | Status: DC
  Administered 2024-09-26: 88 ug via ORAL
  Filled 2024-09-25: qty 1

## 2024-09-25 MED ORDER — ALBUTEROL SULFATE (2.5 MG/3ML) 0.083% IN NEBU: 2.5000 mg | INHALATION_SOLUTION | Freq: Four times a day (QID) | RESPIRATORY_TRACT | Status: DC | PRN

## 2024-09-25 NOTE — ED Triage Notes (Signed)
 Patient BIB [facility] from home after daughter called for stroke like symptoms. Daughter was on the phone with patient at 1400 and noticed she was aphasic and called EMS. Patient reports dizziness and bilateral blurred vision at 1330 when she got out of the shower. Patient reported nausea en route and got 4mg  zofran , minimal improvement noted. Patient has 18g L AC and L forearm. A&Ox4 on arrival. BP 144/74 HR 100 R 18 99% RA CBG 118

## 2024-09-25 NOTE — Consult Note (Addendum)
 NEUROLOGY CONSULT NOTE   Date of service: September 25, 2024 Patient Name: [name] Trujillo MRN:  [id number] DOB:  19-Aug-1941 Chief Complaint: CODE STROKE Requesting Provider: Emil Share, DO  History of Present Illness  [name] Trujillo is a 84 y.o. female with hx of MI, Afib not on OAC, Takotsubo Cardiomyopathy, joint pain, neuropathy who was BIB EMS as an activated CODE STROKE d/t aphasia, blurred vision and dizziness. On EMS arrival, symptoms were noted to be improving.   On exam at ED bridge, patient alert, oriented, denies headache or blurry vision, vff, no focal weakness or sensory deficits. Patient endorsed she was back to baseline. CTH negative.   Spoke with daughter over the phone. Patient was taken off her OAC due to only having 2 Afib episodes in 25 years, requiring NSAIDS for chronic joint pain, and high fall risk d/t chronic gait disturbance.  I spoke with her daughter over phone who noticed word finding difficulty and requested calling EMS.  Patient has a known hx of afibb but not on anticoagulation and upon further discussion with daughter, appears to have had brief and rare episodes of afibb and with her hx of arthritis, was placed on NSAIDS and therefore not on eliquis .  LKW: 1330 Modified rankin score: 0-Completely asymptomatic and back to baseline post- stroke IV Thrombolysis: No, resolved symptoms EVT: No, no LVO suspected  NIHSS components Score: Comment  1a Level of Conscious 0[x]  1[]  2[]  3[]      1b LOC Questions 0[x]  1[]  2[]       1c LOC Commands 0[x]  1[]  2[]       2 Best Gaze 0[x]  1[]  2[]       3 Visual 0[x]  1[]  2[]  3[]      4 Facial Palsy 0[x]  1[]  2[]  3[]      5a Motor Arm - left 0[x]  1[]  2[]  3[]  4[]  UN[]    5b Motor Arm - Right 0[x]  1[]  2[]  3[]  4[]  UN[]    6a Motor Leg - Left 0[x]  1[]  2[]  3[]  4[]  UN[]    6b Motor Leg - Right 0[x]  1[]  2[]  3[]  4[]  UN[]    7 Limb Ataxia 0[x]  1[]  2[]  UN[]      8 Sensory 0[x]  1[]  2[]  UN[]      9 Best Language 0[x]  1[]  2[]  3[]      10  Dysarthria 0[x]  1[]  2[]  UN[]      11 Extinct. 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   She had ACUTE TAKOTSUBO (STRESS) CARDIOMYOPATHY with elevated Troponin Levels - this would be considered Demand Ischemia - Demand Infarction & NOT associated with ACS/CAD.     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  - Cath with NO CAD. ==> RESOLVED: f/u Echo 03/2022: EF 60-65%.    Past Surgical History:  Procedure Laterality Date   APPENDECTOMY     34-18 yo   BACK SURGERY     Age 72   CESAREAN SECTION  ECTOPIC PREGNANCY SURGERY     LAPAROSCOPIC HYSTERECTOMY     LEFT HEART CATH AND CORONARY ANGIOGRAPHY N/A 01/09/2022   Procedure: LEFT HEART CATH AND CORONARY ANGIOGRAPHY;  Surgeon: Wonda Sharper, MD;  Location: [facility] INVASIVE CV LAB;  Service: CV:: Widely patent coronaries with mild nonobstructive LAD plaquing.  Right dominant system.  Normal LVEDP.  Based on clinical presentation, findings are consistent with acute Takotsubo Cardiomyopathy Syndrome   POSTERIOR FUSION LUMBAR SPINE  05/24/2022   [facility], [place]; [name], MD): L2-L3 XLIF, L2-L3 POSTERIOR DECOMPRESSION AND FUSION   TRANSTHORACIC ECHOCARDIOGRAM  01/08/2022   Severely decreased LV function-EF 25 to 30%.  Mid to apical (mostly anterior) with normal basal motion.  GR 2 DD-elevated LAP.  Mildly dilated LA.  Aortic sclerosis with no stenosis.  No AI.  Normal MV with mild to moderate TR.  Mildly elevated RAP, and PAP  (estimated 49 mg). If LAD CAD ruled out - consistent with Takotsubo CM Syndrome.   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 She has never used smokeless tobacco. She reports current alcohol use. She reports that she does not use drugs.  Allergies[1]  Medications  Current Medications[2]  Vitals   Vitals:   2024-10-09 1554 09-Oct-2024 1615  BP:  (!) 157/69  Resp:  20  Weight: 58 kg     Body mass index is 24.16 kg/m.   Physical Exam   Constitutional: Appears well-developed and well-nourished.   Neurologic Examination   Neuro: Mental Status: Patient is awake, alert, oriented to person, place, month, year, and situation. Patient is able to give a clear and coherent history. No signs of aphasia or neglect Cranial Nerves: II: Visual Fields are full. Pupils are equal, round, and reactive to light.   III,IV, VI: EOMI without ptosis or diploplia.  V: Facial sensation is symmetric to light touch VII: Facial movement is symmetric.  VIII: hearing is intact to voice X: Uvula elevates symmetrically XI: Shoulder shrug is symmetric. XII: tongue is midline without atrophy or fasciculations.  Motor: Tone is normal. Bulk is normal. 5/5 strength was present in all four extremities.  Sensory: Sensation is symmetric to light touch in the arms and legs. Cerebellar: FNF and HKS are intact bilaterally with bilateral upper extremity intentional tremors (baseline)   Labs/Imaging/Neurodiagnostic studies   CBC:  Recent Labs  Lab October 09, 2024 1553 10-09-2024 1555  WBC 7.3  --   NEUTROABS 4.5  --   HGB 14.0 13.9  HCT 42.6 41.0  MCV 97.9  --   PLT 297  --    Basic Metabolic  Panel:  Lab Results  Component Value Date   NA 141 October 09, 2024   K 4.1 2024-10-09   CO2 22 12/21/2023   GLUCOSE 104 (H) 2024/10/09   BUN 18 10/09/2024   CREATININE 0.90 October 09, 2024   CALCIUM  10.0 12/21/2023   GFRNONAA >60 12/21/2023   GFRAA 86 04/05/2020   Lipid Panel:  Lab Results  Component Value Date   LDLCALC 81 01/08/2022   HgbA1c:  Lab Results  Component Value Date   HGBA1C 5.3 01/08/2022   Urine Drug Screen:  No results found for: LABOPIA, COCAINSCRNUR, LABBENZ, AMPHETMU, THCU, LABBARB  Alcohol Level No results found for: [facility] INR  Lab Results  Component Value Date   INR 0.9 09/25/2024   APTT  Lab Results  Component Value Date   APTT 25 09/25/2024   AED levels: No results found for: PHENYTOIN, ZONISAMIDE, LAMOTRIGINE, LEVETIRACETA  CT Head without contrast(Personally reviewed): No acute intracranial abnormality. ASPECTS score 10.  MRI Brain(Personally reviewed): ordered   ASSESSMENT   [name] Trujillo is a 84 y.o. female with hx of MI, Afib not on OAC, Takotsubo Cardiomyopathy, joint pain, neuropathy who was BIB EMS as an activated CODE STROKE d/t aphasia, blurred vision and dizziness. On assessment, all symptoms are resolved.   CTH negative. Patient has been off Eliquis  for a few months now. Taken off due to having 2 afib episodes in 25 years, needing NSAIDS for her chronic joint pain and for being a high fall risk secondary to her chronic gait disturbance. Workup is warranted for a TIA and to revisit risks versus benefits of OAC.   Impression: TIA likely due to resolution of symptoms  RECOMMENDATIONS    - Frequent Neuro checks per stroke unit protocol - MRI Brain stroke protocol - Vascular imaging - CT Angio - TTE - Lipid panel - Statin - will be started if LDL>70 or otherwise medically indicated - A1C - Antithrombotic - Aspirin  and Plavix .  - DVT ppx - ok for lovenox  - Smoking cessation - will counsel patient - SBP goal -  <220, PRN labetalol if HR>60 and PRN Hydralazine  if HR<60 - Telemetry monitoring for arrhythmia - 72h - Swallow screen - will be performed prior to PO intake - Stroke education - will be given - PT/OT/SLP - Dispo: admit for TIA/Stroke workup anmd re-evaluate anticoagulation risks/benefits.   Stroke team will follow beginning 1/18 am.   ______________________________________________________________________    Signed, Rocky JAYSON Likes, NP [facility]    NEUROHOSPITALIST ADDENDUM Performed a face to face diagnostic evaluation.   I have reviewed the contents of history and physical exam as documented by PA/ARNP/Resident and agree with above documentation.  I have discussed and formulated the above plan as documented. Edits to the note have been made as needed.  Judye Lorino, MD [facility] [phone]   If 7pm to 7am, please call on call as listed on AMION.     [1]  Allergies Allergen Reactions   Crestor  [Rosuvastatin  Calcium ] Other (See Comments)   Lipitor [Atorvastatin ] Other (See Comments)    myalgias   Oxycodone Hcl Nausea And Vomiting   Primidone    Propranolol    Tolterodine    Zetia [Ezetimibe] Other (See Comments)    myalgias   Hydrocodone Nausea And Vomiting  [2]  Current Facility-Administered Medications:    aspirin  EC tablet 81 mg, 81 mg, Oral, Daily, Alexas Basulto, MD   sodium chloride  flush (NS) 0.9 % injection 3 mL, 3 mL, Intravenous, Once, Emil Share, DO  Current Outpatient Medications:    acetaminophen  (TYLENOL ) 325 MG tablet, Take 975 mg by mouth daily as needed for moderate pain., Disp: , Rfl:    apixaban  (ELIQUIS ) 2.5 MG TABS tablet, Take 1 tablet (2.5 mg total) by mouth 2 (two) times daily., Disp: 180 tablet, Rfl: 1   Carboxymethylcellulose Sodium (REFRESH TEARS OP), Place 2 drops into both eyes 3 (three) times daily., Disp: , Rfl:    levothyroxine  (SYNTHROID ) 88 MCG tablet, Take 88 mcg by mouth daily before  breakfast., Disp: , Rfl:  losartan  (COZAAR ) 25 MG tablet, Take 1/2 tablet (12.5 mg total) by mouth daily., Disp: 45 tablet, Rfl: 2   metoprolol  succinate (TOPROL  XL) 25 MG 24 hr tablet, Take 0.5 tablets (12.5 mg total) by mouth daily., Disp: 45 tablet, Rfl: 1   REPATHA  SURECLICK 140 MG/ML SOAJ, INJECT 140 MG UNDER THE SKIN EVERY TWO WEEKS INTO THE THIGH, STOMACH, OR UPPER ARM, Disp: 6 mL, Rfl: 1   solifenacin (VESICARE) 10 MG tablet, Take 10 mg by mouth daily., Disp: , Rfl:

## 2024-09-25 NOTE — H&P (Signed)
 " History and Physical    Patient: [name] Trujillo FMW:[id number] DOB: 1940/09/21 DOA: 09/25/2024 DOS: the patient was seen and examined on 09/25/2024 PCP: Windy Coy, MD  Patient coming from: Home via EMS  Chief Complaint:  Chief Complaint  Patient presents with   Code Stroke   HPI: [name] Trujillo is a 84 y.o. female with medical history significant of hyperlipidemia, paroxysmal atrial fibrillation not on anticoagulation, heart failure with recovered ejection fraction, and hypothyroidism, presents with double vision and difficulty speaking.  She experienced double vision after taking a regular shower. Although she did not feel dizzy, she felt out of balance and unsteady, prompting her to sit down. The double vision persisted, and she also had difficulty finding words during a phone conversation with her daughter, which is unusual for her.  The double vision lasted for quite a while, and she had difficulty speaking until after emergency personnel arrived. She did not experience any headache but felt nauseous. She did not feel her heart beating irregularly and checked her heart rate with a watch, which was normal at the time.  She has a history of Takotsubo cardiomyopathy diagnosed back in 2023, with a previous episode of a high heart rate reaching 183 bpm. She was previously on Eliquis  twice daily but discontinued it after cataract surgeries due to being a high fall risk and having significant neuropathy. She has had two spinal fusions and experiences neuropathy, which contributes to her fall risk.  She recently underwent an MRI for right leg pain, initially suspected to be bursitis, which lasted about six months before resolving. No recent leg swelling, travel, or weakness on one side of the body.  In the emergency department patient seen as a code stroke.  CT scan of the head did not reveal any acute abnormality.  Patient was noted to be afebrile with blood pressures elevated up to 178/73,  and all other vital signs maintained.  Labs were relatively unremarkable.  Patient was not a candidate for thrombolytics due to mild symptoms.  Review of Systems: As mentioned in the history of present illness. All other systems reviewed and are negative. 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   She had ACUTE TAKOTSUBO (STRESS) CARDIOMYOPATHY with elevated Troponin Levels - this would be considered Demand Ischemia - Demand Infarction & NOT associated with ACS/CAD.     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  - Cath with NO CAD. ==> RESOLVED: f/u Echo 03/2022: EF 60-65%.   Past Surgical History:  Procedure Laterality Date   APPENDECTOMY     32-18 yo   BACK SURGERY     Age 79   CESAREAN SECTION     ECTOPIC PREGNANCY SURGERY     LAPAROSCOPIC HYSTERECTOMY     LEFT HEART CATH AND CORONARY ANGIOGRAPHY N/A 01/09/2022   Procedure: LEFT HEART CATH AND CORONARY ANGIOGRAPHY;  Surgeon: Wonda Sharper, MD;  Location: [facility] INVASIVE CV LAB;  Service: CV:: Widely patent coronaries with mild nonobstructive LAD plaquing.  Right dominant system.  Normal LVEDP.  Based on clinical presentation, findings are consistent with acute Takotsubo Cardiomyopathy Syndrome  POSTERIOR FUSION LUMBAR SPINE  05/24/2022   [facility], [place]; [name], MD): L2-L3 XLIF, L2-L3 POSTERIOR DECOMPRESSION AND FUSION   TRANSTHORACIC ECHOCARDIOGRAM  01/08/2022   Severely decreased LV function-EF 25 to 30%.  Mid to apical (mostly anterior) with normal basal motion.  GR 2 DD-elevated LAP.  Mildly dilated LA.  Aortic sclerosis with no stenosis.  No AI.   Normal MV with mild to moderate TR.  Mildly elevated RAP, and PAP (estimated 49 mg). If LAD CAD ruled out - consistent with Takotsubo CM Syndrome.   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 She has never used smokeless tobacco. She reports current alcohol use. She reports that she does not use drugs.  Allergies[1]  Family History  Problem Relation Age of Onset   Stroke Mother    Heart attack Father    Alcohol abuse Father    Neuropathy Neg Hx     Prior to Admission medications  Medication Sig Start Date End Date Taking? Authorizing Provider  acetaminophen  (TYLENOL ) 325 MG tablet Take 975 mg by mouth daily as needed for moderate pain.    [provider]  apixaban  (ELIQUIS ) 2.5 MG TABS tablet Take 1 tablet (2.5 mg total) by mouth 2 (two) times daily. 03/26/24   Anner Alm ORN, MD  Carboxymethylcellulose Sodium (REFRESH TEARS OP) Place 2 drops into both eyes 3 (three) times daily.    [provider]  levothyroxine  (SYNTHROID ) 88 MCG tablet Take 88 mcg by mouth daily before breakfast.    [provider]  losartan  (COZAAR ) 25 MG tablet Take 1/2 tablet (12.5 mg total) by mouth daily. 04/02/24   Anner Alm ORN, MD  metoprolol  succinate (TOPROL  XL) 25 MG 24 hr tablet Take 0.5 tablets (12.5 mg total) by mouth daily. 06/17/24   Anner Alm ORN, MD  REPATHA  SURECLICK 140 MG/ML SOAJ INJECT 140 MG UNDER THE SKIN EVERY TWO WEEKS INTO THE THIGH, STOMACH, OR UPPER ARM 08/27/24   Emelia Josefa HERO, NP  solifenacin (VESICARE) 10 MG tablet Take 10 mg by mouth daily.    [provider]    Physical Exam: Vitals:   09/25/24 1554 09/25/24 1615  BP:  (!) 157/69  Resp:  20  Weight: 58 kg    Constitutional: Female currently in no acute distress Eyes: PERRL, lids and conjunctivae normal ENMT: Mucous  membranes are moist.  .Normal dentition.  Neck: normal, supple  Respiratory: clear to auscultation bilaterally, no wheezing, no crackles. Normal respiratory effort. No accessory muscle use.  Cardiovascular: Regular rate and rhythm, no murmurs / rubs / gallops. No extremity edema. 2+ pedal pulses.  Abdomen: no tenderness, no masses palpated.  . Bowel sounds positive.  Musculoskeletal: no clubbing / cyanosis. No joint deformity upper and lower extremities. Good ROM, no contractures. Normal muscle tone.  Skin: no rashes, lesions, ulcers. No induration Neurologic: CN 2-12 grossly intact.  Appears able to move all extremities. Psychiatric: Normal judgment and insight. Alert and oriented x 3. Normal mood.   Data Reviewed:  EKG reveals sinus rhythm at 89 bpm.  Reviewed labs, imaging, and pertinent records as documented.  Assessment and Plan:  Suspected TIA Patient presents with complaints of double vision after taking a shower and difficulty finding words this morning.  CT scan of the head did not note any acute abnormality.  Patient was not a thrombolytic candidate due to symptoms resolving. - Admit to telemetry bed - Stroke order set initiated - Neuro checks - Add-on Hemoglobin A1c and lipid panel in a.m. - Check  MRI  head w/o contrast   - Check echocardiogram - PT/OT/Speech to eval and treat - ASA and Plavix  - Follow-up telemetry overnight - Appreciate neurology consultative services, will follow-up    Essential hypertension Blood pressures elevated up to 178/73. - Allowing for permissive hypertension - Treat blood pressures greater than 220 or diastolics greater than 1  Paroxysmal atrial fibrillation Patient appears to be in sinus rhythm at this time.  Records note prior episodes paroxysmal atrial fibrillation.  She is no longer on anticoagulation due to preference and she reports being at increased risk for falls with neuropathy. - Follow-up telemetry  Heart failure with  recovered ejection fraction Patient appears to be euvolemic on physical exam.  Noted to have a prior history of Takotsubo cardiomyopathy temporarily as low as 25 to 30% back in 2023.  Subsequent echocardiogram showing 60 to 65% with grade 1 diastolic dysfunction when last checked 10/24/2023. - Continue to monitor   Hypothyroidism - Check TSH - Continue Levothyroxine   DVT prophylaxis: Lovenox  Advance Care Planning:   Code Status: Full Code    Consults: Neurology  Family Communication: Family updated at bedside  Severity of Illness: The appropriate patient status for this patient is OBSERVATION. Observation status is judged to be reasonable and necessary in order to provide the required intensity of service to ensure the patient's safety. The patient's presenting symptoms, physical exam findings, and initial radiographic and laboratory data in the context of their medical condition is felt to place them at decreased risk for further clinical deterioration. Furthermore, it is anticipated that the patient will be medically stable for discharge from the hospital within 2 midnights of admission.   Author: Maximino DELENA Sharps, MD 09/25/2024 4:27 PM  For on call review [email or web address].      [1]  Allergies Allergen Reactions   Crestor  [Rosuvastatin  Calcium ] Other (See Comments)   Lipitor [Atorvastatin ] Other (See Comments)    myalgias   Oxycodone Hcl Nausea And Vomiting   Primidone    Propranolol    Tolterodine    Zetia [Ezetimibe] Other (See Comments)    myalgias   Hydrocodone Nausea And Vomiting   "

## 2024-09-25 NOTE — ED Notes (Signed)
 Patient transported to CT

## 2024-09-25 NOTE — Code Documentation (Signed)
 Stroke Response Nurse Documentation Code Documentation  [name] Trujillo is a 84 y.o. female arriving to [facility]  via [facility] on 09/25/2024 with past medical hx of Takotsubo cardiomyopathy, hypothyroidism, HLD, pAfib. On No antithrombotic. Code stroke was activated by EMS.   Patient from home where she was LKW at 1330 and now complaining of dizzy, blurred vision, and aphasia. Per EMS, patient reports she had dizziness and blurred vision bilaterally when she got out of the shower. She was on the phone with her daughter at 20 when her daughter noticed she was aphasic and called EMS.  Stroke team at the bedside on patient arrival. Labs drawn and patient cleared for CT by EDP. Patient to CT with team. NIHSS 0, see documentation for details and code stroke times. The following imaging was completed:  CT Head. Patient is not a candidate for IV Thrombolytic due to patient asymptomatic and back to baseline per MD. Patient is not a candidate for IR due to exam not consistent with LVO per MD.   Care Plan: VS/NIHSS q2hr x12hr, then q4hr; BP Goal <220/120; NPO until swallow screen passed.  Process Delays Noted: N/A   Bedside handoff with ED RN Paden.    [name] Trujillo  Stroke Response RN

## 2024-09-25 NOTE — ED Provider Notes (Signed)
 " [place] [facility] Provider Note   CSN: [id number] Arrival date & time: 09/25/24  1547     Patient presents with: No chief complaint on file.   [name] Trujillo is a 84 y.o. female with a past medical history notable for Takotsubo cardiomyopathy, hypothyroidism, hyperlipidemia, paroxysmal atrial fibrillation who presents today for evaluation of dizziness and blurry vision.  Approximately hour prior to arrival patient was getting out of the shower when she had sudden onset dizziness as well as blurred vision.  Was on the phone with her daughter who noted that she was having some expressive aphasia.  EMS was subsequent called for further evaluation.  Patient reports improvement of symptoms in the interim however is still having some mild dizziness and blurred vision.  Transferred to our facility for further eval ration.  Patient has otherwise been in her normal state of health without any nausea, vomiting, fevers, chills, cough, congestion, chest pain or shortness of breath.  HPI     Prior to Admission medications  Medication Sig Start Date End Date Taking? Authorizing Provider  acetaminophen  (TYLENOL ) 325 MG tablet Take 975 mg by mouth daily as needed for moderate pain.    [provider]  apixaban  (ELIQUIS ) 2.5 MG TABS tablet Take 1 tablet (2.5 mg total) by mouth 2 (two) times daily. 03/26/24   Anner Alm ORN, MD  Carboxymethylcellulose Sodium (REFRESH TEARS OP) Place 2 drops into both eyes 3 (three) times daily.    [provider]  levothyroxine  (SYNTHROID ) 88 MCG tablet Take 88 mcg by mouth daily before breakfast.    [provider]  losartan  (COZAAR ) 25 MG tablet Take 1/2 tablet (12.5 mg total) by mouth daily. 04/02/24   Anner Alm ORN, MD  metoprolol  succinate (TOPROL  XL) 25 MG 24 hr tablet Take 0.5 tablets (12.5 mg total) by mouth daily. 06/17/24   Anner Alm ORN, MD  REPATHA  SURECLICK 140 MG/ML SOAJ INJECT 140 MG UNDER  THE SKIN EVERY TWO WEEKS INTO THE THIGH, STOMACH, OR UPPER ARM 08/27/24   Emelia Josefa HERO, NP  solifenacin (VESICARE) 10 MG tablet Take 10 mg by mouth daily.    [provider]    Allergies: Crestor  [rosuvastatin  calcium ], Lipitor [atorvastatin ], Oxycodone hcl, Primidone, Propranolol, Tolterodine, Zetia [ezetimibe], and Hydrocodone    Review of Systems  Updated Vital Signs There were no vitals taken for this visit.  Physical Exam Constitutional:      General: She is not in acute distress.    Appearance: Normal appearance.  HENT:     Head: Normocephalic.     Right Ear: External ear normal.     Left Ear: External ear normal.     Nose: Nose normal.     Mouth/Throat:     Mouth: Mucous membranes are moist.  Eyes:     Pupils: Pupils are equal, round, and reactive to light.  Cardiovascular:     Pulses: Normal pulses.  Pulmonary:     Effort: Pulmonary effort is normal.  Abdominal:     General: Abdomen is flat.  Musculoskeletal:        General: Normal range of motion.     Cervical back: Normal range of motion.  Skin:    General: Skin is warm.     Capillary Refill: Capillary refill takes less than 2 seconds.  Neurological:     General: No focal deficit present.     Mental Status: She is alert and oriented to person, place, and time.  Cranial Nerves: No cranial nerve deficit.     Sensory: No sensory deficit.     Motor: No weakness.  Psychiatric:        Mood and Affect: Mood normal.     (all labs ordered are listed, but only abnormal results are displayed) Labs Reviewed  CBG MONITORING, ED - Abnormal; Notable for the following components:      Result Value   Glucose-Capillary 117 (*)    All other components within normal limits  PROTIME-INR  APTT  CBC  DIFFERENTIAL  COMPREHENSIVE METABOLIC PANEL WITH GFR  ETHANOL  I-STAT CHEM 8, ED    EKG: None  Radiology: No results found. Procedures   Medications Ordered in the ED  sodium chloride  flush (NS)  0.9 % injection 3 mL (has no administration in time range)                                  Medical Decision Making Amount and/or Complexity of Data Reviewed Labs: ordered. Radiology: ordered.  Risk Decision regarding hospitalization.   Patient is an 84 year old female who presents today for evaluation of sudden onset dizziness, blurry vision and aphasia.  On initial assessment patient was noted to be  Patient was seen as a stroke alert and seen in conjunction with neurology.  Son at bedside assessment patient was found to be resting comfortably without acute distress.  Patient without any obvious neurologic deficits at this point in time. Baseline intention tremor and gait instability after prior decompressive spinal surgery.  Sensorium intact throughout.  No obvious aphasia.  No confusion.  Oriented x 4.  No dysmetria.  No obvious abnormalities on code stroke imaging.  Initial laboratory evaluation without acute findings.  On further discussion with neurology will admit to the hospitalist team for TIA workup.  Patient was admitted in stable condition.    Final diagnoses:  TIA (transient ischemic attack)  Dizziness    ED Discharge Orders     None          Laurita Sieving, MD 09/25/24 2301    Emil Share, DO 09/25/24 2310  "

## 2024-09-25 NOTE — ED Notes (Signed)
 Patient transported to MRI

## 2024-09-25 NOTE — ED Notes (Signed)
 Pt ambulated to and from restroom with walker and 1 person assist. Pt tolerated well.

## 2024-09-26 ENCOUNTER — Observation Stay (HOSPITAL_COMMUNITY)

## 2024-09-26 DIAGNOSIS — R42 Dizziness and giddiness: Secondary | ICD-10-CM

## 2024-09-26 DIAGNOSIS — I10 Essential (primary) hypertension: Secondary | ICD-10-CM

## 2024-09-26 DIAGNOSIS — I159 Secondary hypertension, unspecified: Secondary | ICD-10-CM

## 2024-09-26 DIAGNOSIS — R569 Unspecified convulsions: Secondary | ICD-10-CM

## 2024-09-26 DIAGNOSIS — E039 Hypothyroidism, unspecified: Secondary | ICD-10-CM | POA: Diagnosis not present

## 2024-09-26 DIAGNOSIS — G459 Transient cerebral ischemic attack, unspecified: Secondary | ICD-10-CM | POA: Diagnosis not present

## 2024-09-26 DIAGNOSIS — I5020 Unspecified systolic (congestive) heart failure: Secondary | ICD-10-CM | POA: Diagnosis not present

## 2024-09-26 DIAGNOSIS — I480 Paroxysmal atrial fibrillation: Secondary | ICD-10-CM | POA: Diagnosis not present

## 2024-09-26 LAB — ECHOCARDIOGRAM COMPLETE
Area-P 1/2: 4.49 cm2
Calc EF: 58.3 %
MV M vel: 4.25 m/s
MV Peak grad: 72.1 mmHg
S' Lateral: 2.5 cm
Single Plane A2C EF: 62.4 %
Single Plane A4C EF: 58 %
Weight: 2081.14 [oz_av]

## 2024-09-26 LAB — LIPID PANEL
Cholesterol: 125 mg/dL (ref 0–200)
HDL: 47 mg/dL
LDL Cholesterol: 63 mg/dL (ref 0–99)
Total CHOL/HDL Ratio: 2.7 ratio
Triglycerides: 76 mg/dL
VLDL: 15 mg/dL (ref 0–40)

## 2024-09-26 LAB — CBC
HCT: 38.1 % (ref 36.0–46.0)
Hemoglobin: 12.6 g/dL (ref 12.0–15.0)
MCH: 32.1 pg (ref 26.0–34.0)
MCHC: 33.1 g/dL (ref 30.0–36.0)
MCV: 96.9 fL (ref 80.0–100.0)
Platelets: 248 K/uL (ref 150–400)
RBC: 3.93 MIL/uL (ref 3.87–5.11)
RDW: 11.5 % (ref 11.5–15.5)
WBC: 6.3 K/uL (ref 4.0–10.5)
nRBC: 0 % (ref 0.0–0.2)

## 2024-09-26 LAB — BASIC METABOLIC PANEL WITH GFR
Anion gap: 9 (ref 5–15)
BUN: 13 mg/dL (ref 8–23)
CO2: 26 mmol/L (ref 22–32)
Calcium: 8.8 mg/dL — ABNORMAL LOW (ref 8.9–10.3)
Chloride: 109 mmol/L (ref 98–111)
Creatinine, Ser: 0.8 mg/dL (ref 0.44–1.00)
GFR, Estimated: >60 mL/min
Glucose, Bld: 105 mg/dL — ABNORMAL HIGH (ref 70–99)
Potassium: 3.7 mmol/L (ref 3.5–5.1)
Sodium: 143 mmol/L (ref 135–145)

## 2024-09-26 MED ORDER — ASPIRIN 81 MG PO TBEC
81.0000 mg | DELAYED_RELEASE_TABLET | Freq: Every day | ORAL | Status: DC
  Administered 2024-09-26: 81 mg via ORAL
  Filled 2024-09-26: qty 1

## 2024-09-26 MED ORDER — APIXABAN 2.5 MG PO TABS: 2.5000 mg | ORAL_TABLET | Freq: Two times a day (BID) | ORAL | Status: DC

## 2024-09-26 MED ORDER — ENOXAPARIN SODIUM 40 MG/0.4ML IJ SOSY: 40.0000 mg | PREFILLED_SYRINGE | INTRAMUSCULAR | Status: DC

## 2024-09-26 MED ORDER — CLOPIDOGREL BISULFATE 75 MG PO TABS
75.0000 mg | ORAL_TABLET | Freq: Every day | ORAL | Status: DC
  Administered 2024-09-26: 75 mg via ORAL
  Filled 2024-09-26: qty 1

## 2024-09-26 MED ORDER — ASPIRIN 81 MG PO TBEC: 81.0000 mg | DELAYED_RELEASE_TABLET | Freq: Every day | ORAL | 12 refills | Status: AC

## 2024-09-26 MED ORDER — CLOPIDOGREL BISULFATE 75 MG PO TABS: 75.0000 mg | ORAL_TABLET | Freq: Every day | ORAL | 0 refills | Status: AC

## 2024-09-26 NOTE — Progress Notes (Signed)
 PHARMACY - ANTICOAGULATION CONSULT NOTE  Pharmacy Consult for Apixaban  Indication: atrial fibrillation  Allergies[1]  Patient Measurements: Weight: 59 kg (130 lb 1.1 oz)  Vital Signs: Temp: 98 F (36.7 C) (01/18 1015) Temp Source: Oral (01/18 1015) BP: 120/67 (01/18 0800) Pulse Rate: 99 (01/18 0800)  Labs: Recent Labs    09/25/24 1553 09/25/24 1555 09/26/24 0252  HGB 14.0 13.9 12.6  HCT 42.6 41.0 38.1  PLT 297  --  248  APTT 25  --   --   LABPROT 13.0  --   --   INR 0.9  --   --   CREATININE 0.86 0.90 0.80    CrCl cannot be calculated (Unknown ideal weight.).   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   She had ACUTE TAKOTSUBO (STRESS) CARDIOMYOPATHY with elevated Troponin Levels - this would be considered Demand Ischemia - Demand Infarction & NOT associated with ACS/CAD.     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  - Cath with NO CAD. ==> RESOLVED: f/u Echo 03/2022: EF 60-65%.    Assessment: 73 yof with a history of HLD, AF not on AC, HF, hypothyroidism presenting as a code stroke on 1/17. Pharmacy consult placed for apixaban  for AF indication.  Hgb 13.9>12.6; plt 297>248  Dose reduction criteria (must meet 2 of 3): Age >/= 80: Yes Scr >/= 1.5: No Weight </= 60 kg: Yes (59 kg)  Goal of Therapy:  Monitor platelets by anticoagulation protocol: Yes   Plan:  Start apixaban  2.5 mg BID Monitor weight and for s/s of hemorrhage Educate on use prior to discharge  Dorn Buttner, PharmD, BCPS 09/26/2024 11:05 AM ED Clinical Pharmacist -  [phone]       [1]   Allergies Allergen Reactions   Crestor  [Rosuvastatin  Calcium ] Other (See Comments)    Myalgia- severe leg cramps   Lipitor [Atorvastatin ] Other (See Comments)    myalgias   Oxycodone Hcl Nausea And Vomiting   Primidone Nausea And Vomiting   Propranolol     Unknown reaction   Tolterodine     Unknown reaction   Zetia [Ezetimibe] Other (See Comments)    myalgias   Hydrocodone Nausea And Vomiting

## 2024-09-26 NOTE — ED Notes (Signed)
 OT at bedside.

## 2024-09-26 NOTE — ED Notes (Signed)
 Pt ambulated to restroom w/ walker, tolerated well.

## 2024-09-26 NOTE — Discharge Summary (Signed)
 " Physician Discharge Summary   Patient: [name] Trujillo MRN: [id number] DOB: 1940-11-07  Admit date:     09/25/2024  Discharge date: 09/26/24  Discharge Physician: Sabas GORMAN Brod   PCP: Windy Coy, MD   Recommendations at discharge:    Take aspirin  and plavix  for 3 weeks, then stop plavix  and continue with aspirin  Follow up neurology in 4 weeks  Discharge Diagnoses: Principal Problem:   TIA (transient ischemic attack) Active Problems:   Hypertension   PAF (paroxysmal atrial fibrillation) (HCC)   Heart failure with recovered ejection fraction (HFrecEF) (HCC)   Hypothyroid  Resolved Problems:   * No resolved hospital problems. *  Hospital Course: 84 y.o. female with medical history significant of hyperlipidemia, paroxysmal atrial fibrillation not on anticoagulation, heart failure with recovered ejection fraction, and hypothyroidism, presents with double vision and difficulty speaking.   She experienced double vision after taking a regular shower. Although she did not feel dizzy, she felt out of balance and unsteady, prompting her to sit down. The double vision persisted, and she also had difficulty finding words during a phone conversation with her daughter, which is unusual for her.   The double vision lasted for quite a while, and she had difficulty speaking until after emergency personnel arrived. She did not experience any headache but felt nauseous. She did not feel her heart beating irregularly and checked her heart rate with a watch, which was normal at the time.   She has a history of Takotsubo cardiomyopathy diagnosed back in 2023, with a previous episode of a high heart rate reaching 183 bpm. She was previously on Eliquis  twice daily but discontinued it after cataract surgeries due to being a high fall risk and having significant neuropathy. She has had two spinal fusions and experiences neuropathy, which contributes to her fall risk.   She recently underwent an MRI for  right leg pain, initially suspected to be bursitis, which lasted about six months before resolving. No recent leg swelling, travel, or weakness on one side of the body.   In the emergency department patient seen as a code stroke.  CT scan of the head did not reveal any acute abnormality.  Patient was noted to be afebrile with blood pressures elevated up to 178/73, and all other vital signs maintained.  Labs were relatively unremarkable.  Patient was not a candidate for thrombolytics due to mild symptoms.  Assessment and Plan:  TIA -Presented with double vision after taking shower and difficulty finding words CT head was unremarkable- -MRI was negative -Patient seen by started on aspirin  and Plavix  for 3 weeks and then aspirin  alone - Initiated on Eliquis  as patient has history of PAF, however after discussion with patient's daughter who is interested in knowing [place] .  She was switched back to Plavix  and aspirin . Follow-up echocardiogram showed EF 55-60%, grade 1 diastolic dysfn -A1c is 5.5, LDL 63   Hyperlipidemia -continue repatha   Essential hypertension Blood pressure is stable Continue home medications   Paroxysmal atrial fibrillation Patient appears to be in sinus rhythm at this time.   Records note prior episodes paroxysmal atrial fibrillation.   She is no longer on anticoagulation due to preference and she reports being at increased risk for falls with neuropathy. Neurology recommended treatment with Eliquis , however patient's daughter who is internist did not want her to be on Eliquis .  Patient started on aspirin  and Plavix  as above.   Heart failure with recovered ejection fraction Patient appears to be euvolemic on physical exam.  Noted to have a prior history of Takotsubo cardiomyopathy temporarily as low as 25 to 30% back in 2023.  Subsequent echocardiogram showing 60 to 65% with grade 1 diastolic dysfunction when last checked 10/24/2023. -Echo today showed EF 55-60 %, GD1  DD      Hypothyroidism - TSH 0.489 - Continue Levothyroxine           Consultants: Neurology Procedures performed: Disposition: Home Diet recommendation:  Regular diet DISCHARGE MEDICATION: Allergies as of 09/26/2024       Reactions   Crestor  [rosuvastatin  Calcium ] Other (See Comments)   Myalgia- severe leg cramps   Lipitor [atorvastatin ] Other (See Comments)   myalgias   Oxycodone Hcl Nausea And Vomiting   Primidone Nausea And Vomiting   Propranolol    Unknown reaction   Tolterodine    Unknown reaction   Zetia [ezetimibe] Other (See Comments)   myalgias   Hydrocodone Nausea And Vomiting        Medication List     STOP taking these medications    apixaban  2.5 MG Tabs tablet Commonly known as: ELIQUIS        TAKE these medications    acetaminophen  325 MG tablet Commonly known as: TYLENOL  Take 975 mg by mouth daily as needed for moderate pain.   aspirin  EC 81 MG tablet Take 1 tablet (81 mg total) by mouth daily. Swallow whole. Start taking on: September 27, 2024   clopidogrel  75 MG tablet Commonly known as: PLAVIX  Take 1 tablet (75 mg total) by mouth daily for 21 days. Take aspirin  and Plavix  together for 21 days, then stop Plavix  and continue aspirin  Start taking on: September 27, 2024   levothyroxine  88 MCG tablet Commonly known as: SYNTHROID  Take 88 mcg by mouth daily before breakfast.   losartan  25 MG tablet Commonly known as: COZAAR  Take 1/2 tablet (12.5 mg total) by mouth daily.   metoprolol  succinate 25 MG 24 hr tablet Commonly known as: Toprol  XL Take 0.5 tablets (12.5 mg total) by mouth daily. What changed: when to take this   REFRESH TEARS OP Place 2 drops into both eyes 3 (three) times daily.   Repatha  SureClick 140 MG/ML Soaj Generic drug: Evolocumab  INJECT 140 MG UNDER THE SKIN EVERY TWO WEEKS INTO THE THIGH, STOMACH, OR UPPER ARM   solifenacin 10 MG tablet Commonly known as: VESICARE Take 10 mg by mouth daily.         Follow-up Information     Anner Alm ORN, MD. Schedule an appointment as soon as possible for a visit in 2 week(s).   Specialty: Cardiology Contact information: [address] [place] [address] [phone]         [facility]. Schedule an appointment as soon as possible for a visit in 1 month(s).   Specialty: Neurology Why: stroke clinic Contact information: [address] [place]  [address] [phone]               Discharge Exam: Fredricka Weights   09/25/24 1554 09/25/24 1638  Weight: 58 kg 59 kg   Appear in no acute distress S1s2 RRR Lungs CTA b/L  Condition at discharge: good  The results of significant diagnostics from this hospitalization (including imaging, microbiology, ancillary and laboratory) are listed below for reference.   Imaging Studies: ECHOCARDIOGRAM COMPLETE Result Date: 09/26/2024    ECHOCARDIOGRAM REPORT   Patient Name:   [name] Trujillo Date of Exam: 09/26/2024 Medical Rec #:  [id number]      Height:  61.0 in Accession #:    [id number]     Weight:       130.1 lb Date of Birth:  1940/09/30      BSA:          1.573 m Patient Age:    83 years       BP:           120/67 mmHg Patient Gender: F              HR:           74 bpm. Exam Location:  Inpatient Procedure: 2D Echo, Cardiac Doppler and Color Doppler (Both Spectral and Color            Flow Doppler were utilized during procedure). Indications:    Stroke I63.9  History:        Patient has prior history of Echocardiogram examinations, most                 recent 10/24/2023. Hx of Takotsubo CMO, HFrecEF, Stroke; Risk                 Factors:Hypertension and Dyslipidemia.  Sonographer:    Koleen Popper RDCS Referring Phys: ERIN C LEHNER IMPRESSIONS  1. Left ventricular ejection fraction, by estimation, is 55 to 60%. The left ventricle has normal function. Left ventricular endocardial border not optimally defined to evaluate regional  wall motion. Left ventricular diastolic parameters are consistent with Grade I diastolic dysfunction (impaired relaxation).  2. Right ventricular systolic function is normal. The right ventricular size is normal. There is normal pulmonary artery systolic pressure. The estimated right ventricular systolic pressure is 10.8 mmHg.  3. The mitral valve is grossly normal. Mild mitral valve regurgitation. No evidence of mitral stenosis.  4. The aortic valve is tricuspid. Aortic valve regurgitation is not visualized. Aortic valve sclerosis is present, with no evidence of aortic valve stenosis.  5. The inferior vena cava is normal in size with greater than 50% respiratory variability, suggesting right atrial pressure of 3 mmHg. Comparison(s): No significant change from prior study. Conclusion(s)/Recommendation(s): No intracardiac source of embolism detected on this transthoracic study. Consider a transesophageal echocardiogram to exclude cardiac source of embolism if clinically indicated. FINDINGS  Left Ventricle: Left ventricular ejection fraction, by estimation, is 55 to 60%. The left ventricle has normal function. Left ventricular endocardial border not optimally defined to evaluate regional wall motion. The left ventricular internal cavity size was normal in size. There is no left ventricular hypertrophy. Left ventricular diastolic parameters are consistent with Grade I diastolic dysfunction (impaired relaxation). Right Ventricle: The right ventricular size is normal. No increase in right ventricular wall thickness. Right ventricular systolic function is normal. There is normal pulmonary artery systolic pressure. The tricuspid regurgitant velocity is 1.40 m/s, and  with an assumed right atrial pressure of 3 mmHg, the estimated right ventricular systolic pressure is 10.8 mmHg. Left Atrium: Left atrial size was normal in size. Right Atrium: Right atrial size was normal in size. Pericardium: Trivial pericardial effusion is  present. Presence of epicardial fat layer. Mitral Valve: The mitral valve is grossly normal. Mild mitral valve regurgitation. No evidence of mitral valve stenosis. Tricuspid Valve: The tricuspid valve is grossly normal. Tricuspid valve regurgitation is mild . No evidence of tricuspid stenosis. Aortic Valve: The aortic valve is tricuspid. Aortic valve regurgitation is not visualized. Aortic valve sclerosis is present, with no evidence of aortic valve stenosis. Pulmonic Valve: The pulmonic valve was grossly normal. Pulmonic valve  regurgitation is not visualized. No evidence of pulmonic stenosis. Aorta: The aortic root and ascending aorta are structurally normal, with no evidence of dilitation. Venous: The inferior vena cava is normal in size with greater than 50% respiratory variability, suggesting right atrial pressure of 3 mmHg. IAS/Shunts: The atrial septum is grossly normal.  LEFT VENTRICLE PLAX 2D LVIDd:         4.50 cm      Diastology LVIDs:         2.50 cm      LV e' medial:    7.62 cm/s LV PW:         0.80 cm      LV E/e' medial:  9.6 LV IVS:        1.00 cm      LV e' lateral:   7.83 cm/s LVOT diam:     2.00 cm      LV E/e' lateral: 9.4 LV SV:         62 LV SV Index:   39 LVOT Area:     3.14 cm  LV Volumes (MOD) LV vol d, MOD A2C: 110.0 ml LV vol d, MOD A4C: 108.0 ml LV vol s, MOD A2C: 41.4 ml LV vol s, MOD A4C: 45.4 ml LV SV MOD A2C:     68.6 ml LV SV MOD A4C:     108.0 ml LV SV MOD BP:      63.4 ml RIGHT VENTRICLE             IVC RV S prime:     20.30 cm/s  IVC diam: 1.40 cm TAPSE (M-mode): 2.3 cm LEFT ATRIUM             Index LA diam:        4.30 cm 2.73 cm/m LA Vol (A2C):   28.0 ml 17.80 ml/m LA Vol (A4C):   35.7 ml 22.69 ml/m LA Biplane Vol: 33.1 ml 21.04 ml/m  AORTIC VALVE LVOT Vmax:   101.00 cm/s LVOT Vmean:  69.200 cm/s LVOT VTI:    0.196 m  AORTA Ao Root diam: 2.70 cm Ao Asc diam:  2.80 cm MITRAL VALVE               TRICUSPID VALVE MV Area (PHT): 4.49 cm    TR Peak grad:   7.8 mmHg MV Decel  Time: 169 msec    TR Vmax:        140.00 cm/s MR Peak grad: 72.1 mmHg MR Vmax:      424.50 cm/s  SHUNTS MV E velocity: 73.40 cm/s  Systemic VTI:  0.20 m MV A velocity: 80.70 cm/s  Systemic Diam: 2.00 cm MV E/A ratio:  0.91 Darryle Decent MD Electronically signed by Darryle Decent MD Signature Date/Time: 09/26/2024/4:37:35 PM    Final    EEG adult Result Date: 09/26/2024 Shelton Arlin KIDD, MD     09/26/2024  9:46 AM Patient Name: [name] Trujillo MRN: [id number] Epilepsy Attending: Arlin KIDD Shelton Referring Physician/Provider: [name], MD Date: 09/26/2024 Duration: 22.34 mins Patient history: 84yo F with complaints of double vision after taking a shower and difficulty finding words this morning. EEG to evaluate for seizure Level of alertness: Awake AEDs during EEG study: None Technical aspects: This EEG study was done with scalp electrodes positioned according to the 10-20 International system of electrode placement. Electrical activity was reviewed with band pass filter of 1-70Hz , sensitivity of 7 uV/mm, display speed of 59mm/sec with a 60Hz  notched filter applied as  appropriate. EEG data were recorded continuously and digitally stored.  Video monitoring was available and reviewed as appropriate. Description: The posterior dominant rhythm consists of 9 Hz activity of moderate voltage (25-35 uV) seen predominantly in posterior head regions, symmetric and reactive to eye opening and eye closing. There is 15 to 18 Hz beta activity distributed symmetrically and diffusely. Hyperventilation and photic stimulation were not performed.   IMPRESSION: This study is within normal limits. No seizures or epileptiform discharges were seen throughout the recording. A normal interictal EEG does not exclude the diagnosis of epilepsy. Arlin MALVA Krebs   MR BRAIN WO CONTRAST Result Date: 09/25/2024 EXAM: MRI BRAIN WITHOUT CONTRAST 09/25/2024 06:45:07 PM TECHNIQUE: Multiplanar multisequence MRI of the head/brain was performed without  the administration of intravenous contrast. COMPARISON: CT head without contrast 09/25/2024 17:26 and MR head 11/22/2016. CLINICAL HISTORY: Stroke, follow up. Acute onset of dizziness, blurred vision and aphasia. FINDINGS: BRAIN AND VENTRICLES: No acute infarct. No intracranial hemorrhage. No mass. No midline shift. No hydrocephalus. The sella is unremarkable. Normal flow voids. Mild atrophy and white matter changes are within normal limits for age. Mild progression is noted since the prior MRI. ORBITS: No significant abnormality. SINUSES AND MASTOIDS: No significant abnormality. BONES AND SOFT TISSUES: Normal marrow signal. No soft tissue abnormality. IMPRESSION: 1. Mild atrophy and white matter changes within normal limits for age, with mild progression since the prior MRI. Electronically signed by: Lonni Necessary MD 09/25/2024 06:59 PM EST RP Workstation: [id number]   CT ANGIO HEAD NECK W WO CM Result Date: 09/25/2024 EXAM: CTA HEAD AND NECK WITHOUT AND WITH 09/25/2024 05:59:30 PM TECHNIQUE: CTA of the head and neck was performed without and with the administration of 75 mL iohexol  (OMNIPAQUE ) 350 MG/ML injection. Multiplanar 2D and/or 3D reformatted images are provided for review. Automated exposure control, iterative reconstruction, and/or weight based adjustment of the mA/kV was utilized to reduce the radiation dose to as low as reasonably achievable. Stenosis of the internal carotid arteries measured using NASCET criteria. COMPARISON: CT head without contrast 09/25/2024 and MR head without contrast 11/22/2016. CLINICAL HISTORY: Stroke/TIA, determine embolic source. Dizziness, blurred vision and aphasia. FINDINGS: CTA NECK: AORTIC ARCH AND ARCH VESSELS: No dissection or arterial injury. No significant stenosis of the brachiocephalic or subclavian arteries. CERVICAL CAROTID ARTERIES: Atherosclerotic changes are present at the right carotid bifurcation and proximal right ICA without significant stenosis  of greater than 50% relative to the more distal vessel. Atherosclerotic changes are present at the left carotid bifurcation and proximal left ICA without significant stenosis of greater than 50% relative to the more distal vessel. No dissection or arterial injury. CERVICAL VERTEBRAL ARTERIES: The right vertebral artery is the dominant vessel. No dissection, arterial injury, or significant stenosis. LUNGS AND MEDIASTINUM: Unremarkable. SOFT TISSUES: No acute abnormality. BONES: No acute abnormality. CTA HEAD: ANTERIOR CIRCULATION: No significant stenosis of the internal carotid arteries. No significant stenosis of the anterior cerebral arteries. No significant stenosis of the middle cerebral arteries. No aneurysm. POSTERIOR CIRCULATION: Fetal type posterior cerebral arteries are present bilaterally. A right P1 segment contributes. No significant stenosis of the basilar artery. No significant stenosis of the vertebral arteries. No aneurysm. OTHER: No dural venous sinus thrombosis on this non-dedicated study. IMPRESSION: 1. No large vessel occlusion or aneurysm in the head or neck. 2. Atherosclerotic changes at the right carotid bifurcation and proximal right ICA, and at the left carotid bifurcation and proximal left ICA, without stenosis greater than 50%. Electronically signed by: Lonni Necessary MD 09/25/2024  06:15 PM EST RP Workstation: [id number]   CT HEAD CODE STROKE WO CONTRAST Result Date: 09/25/2024 EXAM: CT HEAD WITHOUT CONTRAST 09/25/2024 03:57:45 PM TECHNIQUE: CT of the head was performed without the administration of intravenous contrast. Automated exposure control, iterative reconstruction, and/or weight based adjustment of the mA/kV was utilized to reduce the radiation dose to as low as reasonably achievable. COMPARISON: None available. CLINICAL HISTORY: Neuro deficit, acute, stroke suspected. FINDINGS: BRAIN AND VENTRICLES: Periventricular and scattered subcortical white matter changes bilaterally  are likely within normal limits for age. No acute hemorrhage. No evidence of acute infarct. No hydrocephalus. No extra-axial collection. No mass effect or midline shift. ALBERTA STROKE PROGRAM EARLY CT SCORE (ASPECTS) Ganglionic (caudate, IC, lentiform nucleus, insula, M1-M3): 7 Supraganglionic (M4-M6): 3 Total: 10 ORBITS: No acute abnormality. SINUSES: No acute abnormality. SOFT TISSUES AND SKULL: No acute soft tissue abnormality. No skull fracture. IMPRESSION: 1. No acute intracranial abnormality. 2. ASPECTS score 10. 3. These results were communicated to Dr. Vanessa at 4:01 PM on 09/25/2024 by secure text page via the [facility] messaging system. Electronically signed by: Lonni Necessary MD 09/25/2024 04:02 PM EST RP Workstation: [id number]   MR Lumbar Spine w/o contrast Result Date: 09/20/2024 EXAM: MRI LUMBAR SPINE 09/07/2024 04:52:41 PM TECHNIQUE: Multiplanar multisequence MRI of the lumbar spine was performed without the administration of intravenous contrast. COMPARISON: MR Lumbar Spine 12/29/2016 (Images only). 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 FINDINGS: BONES AND ALIGNMENT: Grade 1 retrolisthesis at L1-L2. PLIF at L2-L3. Normal vertebral body heights. Bone marrow signal is unremarkable. SPINAL CORD: The conus terminates normally. SOFT TISSUES: No paraspinal mass. L1-L2: Small disc bulge with endplate spurring. Right greater than left lateral recess narrowing. Moderate right neural foraminal stenosis. No central spinal canal stenosis. L2-L3: PLIF. No central spinal canal or neural foraminal stenosis. L3-L4: Small right asymmetric disc bulge. Narrowing of both lateral recesses. Mild central spinal canal stenosis. Moderate right neural foraminal stenosis is worsened. L4-L5: Small disc bulge. No spinal canal stenosis. Mild bilateral foraminal stenosis. L5-S1: Moderate left facet hypertrophy and moderate left  foraminal stenosis. Small disc bulge with endplate spurring. No spinal canal stenosis. IMPRESSION: 1. L3-L4 small right asymmetric disc bulge causing mild central spinal canal stenosis, narrowing of both lateral recesses, and moderate right neural foraminal stenosis, worsened compared to prior study. 2. L1-L2 small disc bulge with endplate spurring causing moderate right neural foraminal stenosis and right greater than left lateral recess narrowing, without central spinal canal stenosis. 3. L5-S1 small disc bulge with endplate spurring and moderate left foraminal stenosis, without spinal canal stenosis. 4. L4-L5 small disc bulge with mild bilateral foraminal stenosis, without spinal canal stenosis. 5. Status post L2-L3 posterior lumbar interbody fusion, without residual central spinal canal or neural foraminal stenosis. Electronically signed by: Franky Stanford MD MD 09/20/2024 01:13 AM EST RP Workstation: [id number]    Microbiology: No results found for this or any previous visit.  Labs: CBC: Recent Labs  Lab 09/25/24 1553 09/25/24 1555 09/26/24 0252  WBC 7.3  --  6.3  NEUTROABS 4.5  --   --   HGB 14.0 13.9 12.6  HCT 42.6 41.0 38.1  MCV 97.9  --  96.9  PLT 297  --  248   Basic Metabolic Panel: Recent Labs  Lab 09/25/24 1553 09/25/24 1555 09/26/24 0252  NA 138 141 143  K 4.2 4.1 3.7  CL 103 105 109  CO2 22  --  26  GLUCOSE 118* 104* 105*  BUN 17 18 13   CREATININE 0.86 0.90 0.80  CALCIUM  9.9  --  8.8*   Liver Function Tests: Recent Labs  Lab 09/25/24 1553  AST 16  ALT 8  ALKPHOS 80  BILITOT 0.4  PROT 6.9  ALBUMIN 4.3   CBG: Recent Labs  Lab 09/25/24 1548  GLUCAP 117*    Discharge time spent: greater than 30 minutes.  Signed: Sabas GORMAN Brod, MD [facility] 09/26/2024 "

## 2024-09-26 NOTE — Progress Notes (Signed)
 [facility]  PROGRESS NOTE  [name] Trujillo FMW:[id number] DOB: 31-Mar-1941 DOA: 09/25/2024 PCP: [name] Coy, MD   Brief HPI:    84 y.o. female with medical history significant of hyperlipidemia, paroxysmal atrial fibrillation not on anticoagulation, heart failure with recovered ejection fraction, and hypothyroidism, presents with double vision and difficulty speaking.   She experienced double vision after taking a regular shower. Although she did not feel dizzy, she felt out of balance and unsteady, prompting her to sit down. The double vision persisted, and she also had difficulty finding words during a phone conversation with her daughter, which is unusual for her.   The double vision lasted for quite a while, and she had difficulty speaking until after emergency personnel arrived. She did not experience any headache but felt nauseous. She did not feel her heart beating irregularly and checked her heart rate with a watch, which was normal at the time.   She has a history of Takotsubo cardiomyopathy diagnosed back in 2023, with a previous episode of a high heart rate reaching 183 bpm. She was previously on Eliquis  twice daily but discontinued it after cataract surgeries due to being a high fall risk and having significant neuropathy. She has had two spinal fusions and experiences neuropathy, which contributes to her fall risk.   She recently underwent an MRI for right leg pain, initially suspected to be bursitis, which lasted about six months before resolving. No recent leg swelling, travel, or weakness on one side of the body.   In the emergency department patient seen as a code stroke.  CT scan of the head did not reveal any acute abnormality.  Patient was noted to be afebrile with blood pressures elevated up to 178/73, and all other vital signs maintained.  Labs were relatively unremarkable.  Patient was not a candidate for thrombolytics due to mild symptoms.    Assessment/Plan:     TIA -Presented with double vision after taking shower and difficulty finding words CT head was unremarkable- -MRI was negative -Patient seen by started on aspirin  and Plavix  for 3 weeks and then aspirin  alone - Initiated on Eliquis  as patient has history of PAF, however after discussion with patient'[name] who is interested in knowing [name] Trujillo .  She was switched back to Plavix  and aspirin . Follow-up echocardiogram -A1c is 5.5, LDL 63  Essential hypertension Blood pressure is stable   Paroxysmal atrial fibrillation Patient appears to be in sinus rhythm at this time.   Records note prior episodes paroxysmal atrial fibrillation.   She is no longer on anticoagulation due to preference and she reports being at increased risk for falls with neuropathy. Neurology recommended treatment with Eliquis , however patient'[name] who is internist did not want her to be on Eliquis .  Patient started on aspirin  and Plavix  as above.   Heart failure with recovered ejection fraction Patient appears to be euvolemic on physical exam.  Noted to have a prior history of Takotsubo cardiomyopathy temporarily as low as 25 to 30% back in 2023.  Subsequent echocardiogram showing 60 to 65% with grade 1 diastolic dysfunction when last checked 10/24/2023. - Repeat echo ordered today     Hypothyroidism - TSH 0.489 - Continue Levothyroxine          DVT prophylaxis: Given apixaban  today.  Will start Lovenox  from tomorrow morning  Medications      stroke: early stages of recovery book   Does not apply Once   aspirin  EC  81 mg Oral Daily   clopidogrel   75  mg Oral Daily   enoxaparin  (LOVENOX ) injection  40 mg Subcutaneous Q24H   levothyroxine   88 mcg Oral Q0600     Data Reviewed:   CBG:  Recent Labs  Lab 09/25/24 1548  GLUCAP 117*    SpO2: 95 %    Vitals:   09/26/24 0330 09/26/24 0615 09/26/24 0617 09/26/24 0620  BP:  (!) 111/59  (!) 111/59  Pulse: 71   79  Resp: 18 17 17 16   Temp:   97.7  F (36.5 C)   TempSrc:   Oral   SpO2: 94%   95%  Weight:          Data Reviewed:  Basic Metabolic Panel: Recent Labs  Lab 09/25/24 1553 09/25/24 1555 09/26/24 0252  NA 138 141 143  K 4.2 4.1 3.7  CL 103 105 109  CO2 22  --  26  GLUCOSE 118* 104* 105*  BUN 17 18 13   CREATININE 0.86 0.90 0.80  CALCIUM  9.9  --  8.8*    CBC: Recent Labs  Lab 09/25/24 1553 09/25/24 1555 09/26/24 0252  WBC 7.3  --  6.3  NEUTROABS 4.5  --   --   HGB 14.0 13.9 12.6  HCT 42.6 41.0 38.1  MCV 97.9  --  96.9  PLT 297  --  248    LFT Recent Labs  Lab 09/25/24 1553  AST 16  ALT 8  ALKPHOS 80  BILITOT 0.4  PROT 6.9  ALBUMIN 4.3     Antibiotics: Anti-infectives (From admission, onward)    None        CONSULTS neurology  Code Status: Full code  Family Communication: Discussed with patient'[name] on phone     Subjective   Patient seen and examined, back to baseline.  Denies any weakness   Objective    Physical Examination:  General-appears in no acute distress Heart-S1-S2, regular, no murmur auscultated Lungs-clear to auscultation bilaterally, no wheezing or crackles auscultated Abdomen-soft, nontender, no organomegaly Extremities-no edema in the lower extremities Neuro-alert, oriented x3, no focal deficit noted         [name] Trujillo [name] Trujillo   [facility] If 7PM-7AM, please contact night-coverage at [email or web address], Office  [phone]   09/26/2024, 7:50 AM  LOS: 0 days

## 2024-09-26 NOTE — ED Notes (Addendum)
 Neurologist at bedside.

## 2024-09-26 NOTE — Progress Notes (Signed)
" °  Echocardiogram 2D Echocardiogram has been performed.  Koleen KANDICE Popper 09/26/2024, 5:23 PM "

## 2024-09-26 NOTE — Evaluation (Signed)
 Occupational Therapy Evaluation Patient Details Name: [name] Trujillo MRN: [id number] DOB: Jul 09, 1941 Today's Date: 09/26/2024   History of Present Illness   84 y.o. F presented to [facility] ED 09/25/24 as CODE STROKE d/t double vision and difficulty speaking. CTH (-) acute intracranial abnormality. NIHSS 0. MRI (-). PMHx: HLD, paroxysmal A-Fib not on AC, heart failure with recovered ejection fraction, Takotsubo cardiomyopathy (2023), spinal fusions, neuropathy, and hypothyroidism.     Clinical Impressions [name] was evaluated s/p the above admission list. She is indep at baseline. Upon evaluation, pt demonstrated mod I/indep ability to complete mobility and ADLs. Pts vision was assessed and was [facility] BE FAST education reviewed, pt receptive. Pt does not require further acute, or follow up OT services. Recommend discharge back to pt's environment with assist as needed. OT to sign off with appreciation of order, please re-consult if needed.       If plan is discharge home, recommend the following:   Assist for transportation     Functional Status Assessment   Patient has had a recent decline in their functional status and demonstrates the ability to make significant improvements in function in a reasonable and predictable amount of time.     Equipment Recommendations   None recommended by OT      Precautions/Restrictions   Precautions Precautions: Fall (low) Recall of Precautions/Restrictions: Intact Restrictions Weight Bearing Restrictions Per Provider Order: No     Mobility Bed Mobility Overal bed mobility: Independent                  Transfers Overall transfer level: Independent                        Balance Overall balance assessment: No apparent balance deficits (not formally assessed)         ADL either performed or assessed with clinical judgement   ADL Overall ADL's : At baseline;Modified independent           General ADL Comments: pt  is performing ADLs at mod I baseline, no LOB or safety concerns noted. Vision is back to normal. no dizziness noted.     Vision Baseline Vision/History: 1 Wears glasses Vision Assessment?: Yes Eye Alignment: Within Functional Limits Ocular Range of Motion: Within Functional Limits Alignment/Gaze Preference: Within Defined Limits Tracking/Visual Pursuits: Able to track stimulus in all quads without difficulty Saccades: Within functional limits Convergence: Within functional limits Visual Fields: No apparent deficits Diplopia Assessment:  (resolved) Additional Comments: pt completed clock draw and scanning tasks [facility]     Perception Perception: Within Functional Limits       Praxis Praxis: WFL       Pertinent Vitals/Pain Pain Assessment Pain Assessment: No/denies pain     Extremity/Trunk Assessment Upper Extremity Assessment Upper Extremity Assessment: Overall WFL for tasks assessed   Lower Extremity Assessment Lower Extremity Assessment: Defer to PT evaluation   Cervical / Trunk Assessment Cervical / Trunk Assessment: Normal   Communication Communication Communication: No apparent difficulties   Cognition Arousal: Alert Behavior During Therapy: WFL for tasks assessed/performed Cognition: No apparent impairments             Following commands: Intact       Cueing  General Comments   Cueing Techniques: Verbal cues  VSS - reviewed BE FAST           Home Living Family/patient expects to be discharged to:: Private residence Living Arrangements: Spouse/significant other Available Help at Discharge: Family;Available 24 hours/day;Available PRN/intermittently (  Husband (50 y.o.) 24/7; Son lives in [place] prn) Type of Home: House Home Access: Stairs to enter [employer] of Steps: 5 Entrance Stairs-Rails: Right Home Layout: Two level;Laundry or work area in basement Alternate Teacher, Music of Steps: flight   Bathroom Shower/Tub: Walk-in  shower         Home Equipment: Agricultural Consultant (2 wheels);Other (comment) (walking stick)          Prior Functioning/Environment Prior Level of Function : Independent/Modified Independent             Mobility Comments: Ambulates using walking stick. She reports using RW at night to go to the bathroom. Denies fall hx. Reports just finishing OPPT for her back. Pt reports a hx of balance issues. ADLs Comments: Doesn't drive. She manages her own meds and helps her spouse with his.    OT Problem List: Impaired vision/perception   OT Treatment/Interventions:        OT Goals(Current goals can be found in the care plan section)   Acute Rehab OT Goals Patient Stated Goal: home OT Goal Formulation: With patient Time For Goal Achievement: 09/26/24 Potential to Achieve Goals: Good   AM-PAC OT 6 Clicks Daily Activity     Outcome Measure Help from another person eating meals?: None Help from another person taking care of personal grooming?: None Help from another person toileting, which includes using toliet, bedpan, or urinal?: None Help from another person bathing (including washing, rinsing, drying)?: None Help from another person to put on and taking off regular upper body clothing?: None Help from another person to put on and taking off regular lower body clothing?: None 6 Click Score: 24   End of Session Equipment Utilized During Treatment: Gait belt Nurse Communication: Mobility status  Activity Tolerance: Patient tolerated treatment well Patient left: in bed;with call bell/phone within reach  OT Visit Diagnosis: Dizziness and giddiness (R42)                Time: 9184-9168 OT Time Calculation (min): 16 min Charges:  OT General Charges $OT Visit: 1 Visit OT Evaluation $OT Eval Low Complexity: 1 Low  Lucie Kendall, OTR/L Acute Rehabilitation Services Office [phone] Secure Chat Communication Preferred   Lucie JONETTA Kendall 09/26/2024, 10:17 AM

## 2024-09-26 NOTE — ED Notes (Signed)
 Bed board called and informed that Dr. Drusilla wants this pt admitted and that she does need a room

## 2024-09-26 NOTE — Care Management Obs Status (Signed)
 MEDICARE OBSERVATION STATUS NOTIFICATION   Patient Details  Name: [name] Trujillo MRN: [id number] Date of Birth: 1941/06/20   Medicare Observation Status Notification Given:  Yes    Corean JAYSON Canary, RN 09/26/2024, 3:51 PM

## 2024-09-26 NOTE — Care Management (Signed)
" °  Transition of Care [facility]) Screening Note   Patient Details  Name: Liliani Bobo Date of Birth: 05/09/1941   Transition of Care [facility]) CM/SW Contact:    Corean JAYSON Canary, RN Phone Number: 09/26/2024, 11:46 AM    Transition of Care Department [facility]) has reviewed patient and no TOC needs have been identified at this time. We will continue to monitor patient advancement through interdisciplinary progression rounds. If new patient transition needs arise, please place a TOC consult.   Back to baseline no HH needed according to evaluations "

## 2024-09-26 NOTE — Plan of Care (Addendum)
 Talked with pt daughter Dr. Lenon, who is an internist in [place]. She concerned that pt had only two episode in 25 years and not high indicated for [facility]. I told her that pt ED visit on 12/21/23 for palpitations found to be in new Afib with RVR by EMS. She converted to sinus tachycardia after being given diltiazem x 2 by EMS. Patient was put on Eliquis  2.5 mg BID for a CHADS2VASC score of at least 3 at that time. After that, the longest recording for heart monitoring was 14 days in 4-01/2024 which was negative. No longer cardiac monitoring has been performed. Dr. Lenon felt that pt afib would always been symptomatic which I disagreed. Considering current event as TIA, her CHADS2VASc score would be at least 5, which is indicated for [facility]. Dr. Lenon also felt pt has severe spinal stenosis, with high fall risk, although has not had real falls frequently. Earlier I discussed fall risk with pt and she also told me that she was not falling frequently. Anyway, in this benefit vs. Risk scenario, Dr. Lenon felt the risk is overweighs the benefit. I respect her opinion, and we will switch eliquis   back to DAPT with ASA and palvix for 3 weeks and then ASA alone. I relayed the conversation to Dr. Drusilla.   Ary Cummins, MD PhD Stroke Neurology 09/26/2024 2:49 PM

## 2024-09-26 NOTE — Progress Notes (Signed)
 Routine EEG complete. Results pending.

## 2024-09-26 NOTE — Evaluation (Signed)
 Physical Therapy Evaluation Patient Details Name: [name] Trujillo MRN: [id number] DOB: November 13, 1940 Today's Date: 09/26/2024  History of Present Illness  84 y.o. F presented to [facility] ED 09/25/24 as CODE STROKE d/t double vision and difficulty speaking. CTH (-) acute intracranial abnormality. NIHSS 0. MRI and EEG (-). PMHx: HLD, paroxysmal A-Fib not on AC, heart failure with recovered ejection fraction, Takotsubo cardiomyopathy (2023), spinal fusions, neuropathy, and hypothyroidism.   Clinical Impression  Pt greeted supine in bed, pleasant and agreeable to PT evaluation. PTA, pt was modI for functional mobility using a walking stick vs. RW. She lives with her husband in a two story house with 5 STE. 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 Pt attempted to mobilize within the room without an AD and had 2 LOB requiring CGA to stabilize. Introduced RW and instructed pt to utilize AD at all times while mobilizing currently. She performed transfers with modI and gait with supervision-modI. Pt completed the DGI which assessed her ability to maintain walking balance while responding to different task demands, through various dynamic conditions. She scored a 15/24, which is below the cut-off for increased fall risk (<19/24). Recommended OPPT for balance training, which pt declined. She reports she is back to her baseline function. No further acute PT needs. Recommend discharge back to pt's environment with assist as needed. PT to sign off with appreciation of order, please re-consult if needed.     If plan is discharge home, recommend the following: Assist for transportation;Help with stairs or ramp for entrance   Can travel by private vehicle        Equipment Recommendations None recommended by PT  Recommendations for Other Services       Functional Status Assessment Patient has not had a recent decline in their functional status     Precautions / Restrictions Precautions Precautions:  Fall Recall of Precautions/Restrictions: Intact Restrictions Weight Bearing Restrictions Per Provider Order: No      Mobility  Bed Mobility Overal bed mobility: Modified Independent             General bed mobility comments: Head of stretcher bed elevated.    Transfers Overall transfer level: Modified independent Equipment used: Rolling walker (2 wheels)               General transfer comment: Pt performed sit<>stand using RW. She demonstrated proper hand placement. Powered up without assist. Good eccentric control.    Ambulation/Gait Ambulation/Gait assistance: Supervision, Modified independent (Device/Increase time) Gait Distance (Feet): 250 Feet Assistive device: Rolling walker (2 wheels) Gait Pattern/deviations: Step-through pattern, Decreased stride length, Drifts right/left Gait velocity: reduced Gait velocity interpretation: <1.8 ft/sec, indicate of risk for recurrent falls   General Gait Details: Pt ambulated with short slow steps. She demonstrated increase hip/knee flex bilat to clear feet 2/2 drop foot. Flat foot contact. Pt unsteady, occasionally running into obstacles on her left. Cues to survery the enviroment and to help with navigation. She reports ambulating to/from the bathroom on her own.  Stairs            Wheelchair Mobility     Tilt Bed    Modified Rankin (Stroke Patients Only)       Balance Overall balance assessment: No apparent balance deficits (not formally assessed)                               Standardized Balance Assessment Standardized Balance Assessment : Dynamic  Gait Index   Dynamic Gait Index Level Surface: Mild Impairment Change in Gait Speed: Mild Impairment Gait with Horizontal Head Turns: Mild Impairment Gait with Vertical Head Turns: Mild Impairment Gait and Pivot Turn: Mild Impairment Step Over Obstacle: Moderate Impairment Step Around Obstacles: Mild Impairment Steps: Mild Impairment (Per pt  report she alternates steps, but holds on to the railings.) Total Score: 15       Pertinent Vitals/Pain Pain Assessment Pain Assessment: No/denies pain    Home Living Family/patient expects to be discharged to:: Private residence Living Arrangements: Spouse/significant other Available Help at Discharge: Family;Available 24 hours/day;Available PRN/intermittently (Husband (32 y.o.) 24/7; Son lives in [place] prn) Type of Home: House Home Access: Stairs to enter Entrance Stairs-Rails: Right Entrance Stairs-Number of Steps: 5 Alternate Level Stairs-Number of Steps: flight Home Layout: Two level;Laundry or work area in [employer] Equipment: Agricultural Consultant (2 wheels);Other (comment) (walking stick)      Prior Function Prior Level of Function : Independent/Modified Independent             Mobility Comments: Ambulates using walking stick. She reports using RW at night to go to the bathroom. Denies fall hx. Reports just finishing OPPT for her back. Pt reports a hx of balance issues. ADLs Comments: Indep with self-care. Doesn't drive. She manages her own meds and helps her spouse with his.     Extremity/Trunk Assessment   Upper Extremity Assessment Upper Extremity Assessment: Defer to OT evaluation    Lower Extremity Assessment Lower Extremity Assessment: Overall WFL for tasks assessed;RLE deficits/detail;LLE deficits/detail RLE Deficits / Details: Pt reports hx of drop foot, minimal DF, grossly 2-/5 RLE Sensation: history of peripheral neuropathy RLE Coordination: decreased gross motor (unable to complete RAMs of toe taps, increased time to complete heel-to-shin test) LLE Deficits / Details: Pt reports hx of drop foot, minimal DF, grossly 2-/5 LLE Sensation: history of peripheral neuropathy LLE Coordination: decreased gross motor (unable to complete RAMs of toe taps, increased time to complete heel-to-shin test)    Cervical / Trunk Assessment Cervical / Trunk  Assessment: Normal  Communication   Communication Communication: No apparent difficulties    Cognition Arousal: Alert Behavior During Therapy: WFL for tasks assessed/performed   PT - Cognitive impairments: No apparent impairments, No family/caregiver present to determine baseline                       PT - Cognition Comments: Pt A,Ox4 Following commands: Intact       Cueing Cueing Techniques: Verbal cues     General Comments General comments (skin integrity, edema, etc.): VSS on RA.    Exercises General Exercises - Lower Extremity Hip Flexion/Marching: Standing, Both, AROM, 10 reps (Pt holding onto RW with BUE)   Assessment/Plan    PT Assessment Patient does not need any further PT services  PT Problem List         PT Treatment Interventions      PT Goals (Current goals can be found in the Care Plan section)  Acute Rehab PT Goals PT Goal Formulation: All assessment and education complete, DC therapy    Frequency       Co-evaluation               AM-PAC PT 6 Clicks Mobility  Outcome Measure Help needed turning from your back to your side while in a flat bed without using bedrails?: None Help needed moving from lying on your back to sitting on the side  of a flat bed without using bedrails?: None Help needed moving to and from a bed to a chair (including a wheelchair)?: None Help needed standing up from a chair using your arms (e.g., wheelchair or bedside chair)?: None Help needed to walk in hospital room?: None Help needed climbing 3-5 steps with a railing? : A Little 6 Click Score: 23    End of Session Equipment Utilized During Treatment: Gait belt Activity Tolerance: Patient tolerated treatment well Patient left: in bed;with call bell/phone within reach Nurse Communication: Mobility status PT Visit Diagnosis: Unsteadiness on feet (R26.81);Other abnormalities of gait and mobility (R26.89)    Time: 9247-9188 PT Time Calculation (min)  (ACUTE ONLY): 19 min   Charges:   PT Evaluation $PT Eval Low Complexity: 1 Low   PT General Charges $$ ACUTE PT VISIT: 1 Visit         Randall SAUNDERS, PT, DPT Acute Rehabilitation Services Office: [phone] Secure Chat Preferred  Delon CHRISTELLA Callander 09/26/2024, 11:38 AM

## 2024-09-26 NOTE — ED Notes (Signed)
 EEG at bedside.

## 2024-09-26 NOTE — ED Notes (Addendum)
 PT at bedside.

## 2024-09-26 NOTE — Progress Notes (Signed)
 STROKE TEAM PROGRESS NOTE   SUBJECTIVE (INTERVAL HISTORY) No family is at the bedside.  Overall her condition is completely resolved. Pt stated that yesterday after she had shower, she felt double vision and blurry vision worse than baseline post cataract surgery. Her daughter was calling her at that time, she picked up the call but not able to talk. Daughter called 911. Episode lasted about and resolved. Today she is at baseline. MRI and EEG neg. She did say that she had two bad seizures in the remote past with shaking jerking but this episode was nowhere like it.    OBJECTIVE Temp:  [97.7 F (36.5 C)-98.6 F (37 C)] 98 F (36.7 C) (01/18 1015) Pulse Rate:  [65-99] 99 (01/18 0800) Cardiac Rhythm: Normal sinus rhythm (01/17 2341) Resp:  [15-25] 22 (01/18 0800) BP: (111-178)/(50-81) 120/67 (01/18 0800) SpO2:  [94 %-100 %] 100 % (01/18 0800) Weight:  [58 kg-59 kg] 59 kg (01/17 1638)  Recent Labs  Lab 09/25/24 1548  GLUCAP 117*   Recent Labs  Lab 09/25/24 1553 09/25/24 1555 09/26/24 0252  NA 138 141 143  K 4.2 4.1 3.7  CL 103 105 109  CO2 22  --  26  GLUCOSE 118* 104* 105*  BUN 17 18 13   CREATININE 0.86 0.90 0.80  CALCIUM  9.9  --  8.8*   Recent Labs  Lab 09/25/24 1553  AST 16  ALT 8  ALKPHOS 80  BILITOT 0.4  PROT 6.9  ALBUMIN 4.3   Recent Labs  Lab 09/25/24 1553 09/25/24 1555 09/26/24 0252  WBC 7.3  --  6.3  NEUTROABS 4.5  --   --   HGB 14.0 13.9 12.6  HCT 42.6 41.0 38.1  MCV 97.9  --  96.9  PLT 297  --  248   No results for input(s): CKTOTAL, CKMB, CKMBINDEX, TROPONINI in the last 168 hours. Recent Labs    09/25/24 1553  LABPROT 13.0  INR 0.9   Recent Labs    09/25/24 1643  COLORURINE STRAW*  LABSPEC 1.004*  PHURINE 7.0  GLUCOSEU NEGATIVE  HGBUR SMALL*  BILIRUBINUR NEGATIVE  KETONESUR 5*  PROTEINUR NEGATIVE  NITRITE NEGATIVE  LEUKOCYTESUR LARGE*       Component Value Date/Time   CHOL 125 09/26/2024 0252   TRIG 76  09/26/2024 0252   HDL 47 09/26/2024 0252   CHOLHDL 2.7 09/26/2024 0252   VLDL 15 09/26/2024 0252   LDLCALC 63 09/26/2024 0252   Lab Results  Component Value Date   HGBA1C 5.5 09/25/2024      Component Value Date/Time   LABOPIA NEGATIVE 09/25/2024 1643   COCAINSCRNUR NEGATIVE 09/25/2024 1643   LABBENZ NEGATIVE 09/25/2024 1643   AMPHETMU NEGATIVE 09/25/2024 1643   THCU NEGATIVE 09/25/2024 1643   LABBARB NEGATIVE 09/25/2024 1643    Recent Labs  Lab 09/25/24 1553  ETH <15    I have personally reviewed the radiological images below and agree with the radiology interpretations.  EEG adult Result Date: 09/26/2024 Shelton Arlin KIDD, MD     09/26/2024  9:46 AM Patient Name: [name] Trujillo MRN: [id number] Epilepsy Attending: Arlin KIDD Shelton Referring Physician/Provider: [name], MD Date: 09/26/2024 Duration: 22.34 mins Patient history: 84yo F with complaints of double vision after taking a shower and difficulty finding words this morning. EEG to evaluate for seizure Level of alertness: Awake AEDs during EEG study: None Technical aspects: This EEG study was done with scalp electrodes positioned according to the 10-20 International system of electrode placement. Lobbyist  activity was reviewed with band pass filter of 1-70Hz , sensitivity of 7 uV/mm, display speed of 78mm/sec with a 60Hz  notched filter applied as appropriate. EEG data were recorded continuously and digitally stored.  Video monitoring was available and reviewed as appropriate. Description: The posterior dominant rhythm consists of 9 Hz activity of moderate voltage (25-35 uV) seen predominantly in posterior head regions, symmetric and reactive to eye opening and eye closing. There is 15 to 18 Hz beta activity distributed symmetrically and diffusely. Hyperventilation and photic stimulation were not performed.   IMPRESSION: This study is within normal limits. No seizures or epileptiform discharges were seen throughout the recording. A  normal interictal EEG does not exclude the diagnosis of epilepsy. Arlin MALVA Krebs   MR BRAIN WO CONTRAST Result Date: 09/25/2024 EXAM: MRI BRAIN WITHOUT CONTRAST 09/25/2024 06:45:07 PM TECHNIQUE: Multiplanar multisequence MRI of the head/brain was performed without the administration of intravenous contrast. COMPARISON: CT head without contrast 09/25/2024 17:26 and MR head 11/22/2016. CLINICAL HISTORY: Stroke, follow up. Acute onset of dizziness, blurred vision and aphasia. FINDINGS: BRAIN AND VENTRICLES: No acute infarct. No intracranial hemorrhage. No mass. No midline shift. No hydrocephalus. The sella is unremarkable. Normal flow voids. Mild atrophy and white matter changes are within normal limits for age. Mild progression is noted since the prior MRI. ORBITS: No significant abnormality. SINUSES AND MASTOIDS: No significant abnormality. BONES AND SOFT TISSUES: Normal marrow signal. No soft tissue abnormality. IMPRESSION: 1. Mild atrophy and white matter changes within normal limits for age, with mild progression since the prior MRI. Electronically signed by: Lonni Necessary MD 09/25/2024 06:59 PM EST RP Workstation: [id number]   CT ANGIO HEAD NECK W WO CM Result Date: 09/25/2024 EXAM: CTA HEAD AND NECK WITHOUT AND WITH 09/25/2024 05:59:30 PM TECHNIQUE: CTA of the head and neck was performed without and with the administration of 75 mL iohexol  (OMNIPAQUE ) 350 MG/ML injection. Multiplanar 2D and/or 3D reformatted images are provided for review. Automated exposure control, iterative reconstruction, and/or weight based adjustment of the mA/kV was utilized to reduce the radiation dose to as low as reasonably achievable. Stenosis of the internal carotid arteries measured using NASCET criteria. COMPARISON: CT head without contrast 09/25/2024 and MR head without contrast 11/22/2016. CLINICAL HISTORY: Stroke/TIA, determine embolic source. Dizziness, blurred vision and aphasia. FINDINGS: CTA NECK: AORTIC ARCH  AND ARCH VESSELS: No dissection or arterial injury. No significant stenosis of the brachiocephalic or subclavian arteries. CERVICAL CAROTID ARTERIES: Atherosclerotic changes are present at the right carotid bifurcation and proximal right ICA without significant stenosis of greater than 50% relative to the more distal vessel. Atherosclerotic changes are present at the left carotid bifurcation and proximal left ICA without significant stenosis of greater than 50% relative to the more distal vessel. No dissection or arterial injury. CERVICAL VERTEBRAL ARTERIES: The right vertebral artery is the dominant vessel. No dissection, arterial injury, or significant stenosis. LUNGS AND MEDIASTINUM: Unremarkable. SOFT TISSUES: No acute abnormality. BONES: No acute abnormality. CTA HEAD: ANTERIOR CIRCULATION: No significant stenosis of the internal carotid arteries. No significant stenosis of the anterior cerebral arteries. No significant stenosis of the middle cerebral arteries. No aneurysm. POSTERIOR CIRCULATION: Fetal type posterior cerebral arteries are present bilaterally. A right P1 segment contributes. No significant stenosis of the basilar artery. No significant stenosis of the vertebral arteries. No aneurysm. OTHER: No dural venous sinus thrombosis on this non-dedicated study. IMPRESSION: 1. No large vessel occlusion or aneurysm in the head or neck. 2. Atherosclerotic changes at the right carotid bifurcation and proximal  right ICA, and at the left carotid bifurcation and proximal left ICA, without stenosis greater than 50%. Electronically signed by: Lonni Necessary MD 09/25/2024 06:15 PM EST RP Workstation: [id number]   CT HEAD CODE STROKE WO CONTRAST Result Date: 09/25/2024 EXAM: CT HEAD WITHOUT CONTRAST 09/25/2024 03:57:45 PM TECHNIQUE: CT of the head was performed without the administration of intravenous contrast. Automated exposure control, iterative reconstruction, and/or weight based adjustment of the mA/kV  was utilized to reduce the radiation dose to as low as reasonably achievable. COMPARISON: None available. CLINICAL HISTORY: Neuro deficit, acute, stroke suspected. FINDINGS: BRAIN AND VENTRICLES: Periventricular and scattered subcortical white matter changes bilaterally are likely within normal limits for age. No acute hemorrhage. No evidence of acute infarct. No hydrocephalus. No extra-axial collection. No mass effect or midline shift. ALBERTA STROKE PROGRAM EARLY CT SCORE (ASPECTS) Ganglionic (caudate, IC, lentiform nucleus, insula, M1-M3): 7 Supraganglionic (M4-M6): 3 Total: 10 ORBITS: No acute abnormality. SINUSES: No acute abnormality. SOFT TISSUES AND SKULL: No acute soft tissue abnormality. No skull fracture. IMPRESSION: 1. No acute intracranial abnormality. 2. ASPECTS score 10. 3. These results were communicated to Dr. Vanessa at 4:01 PM on 09/25/2024 by secure text page via the [facility] messaging system. Electronically signed by: Lonni Necessary MD 09/25/2024 04:02 PM EST RP Workstation: [id number]   MR Lumbar Spine w/o contrast Result Date: 09/20/2024 EXAM: MRI LUMBAR SPINE 09/07/2024 04:52:41 PM TECHNIQUE: Multiplanar multisequence MRI of the lumbar spine was performed without the administration of intravenous contrast. COMPARISON: MR Lumbar Spine 12/29/2016 (Images only). 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 FINDINGS: BONES AND ALIGNMENT: Grade 1 retrolisthesis at L1-L2. PLIF at L2-L3. Normal vertebral body heights. Bone marrow signal is unremarkable. SPINAL CORD: The conus terminates normally. SOFT TISSUES: No paraspinal mass. L1-L2: Small disc bulge with endplate spurring. Right greater than left lateral recess narrowing. Moderate right neural foraminal stenosis. No central spinal canal stenosis. L2-L3: PLIF. No central spinal canal or neural foraminal stenosis. L3-L4: Small right asymmetric disc bulge.  Narrowing of both lateral recesses. Mild central spinal canal stenosis. Moderate right neural foraminal stenosis is worsened. L4-L5: Small disc bulge. No spinal canal stenosis. Mild bilateral foraminal stenosis. L5-S1: Moderate left facet hypertrophy and moderate left foraminal stenosis. Small disc bulge with endplate spurring. No spinal canal stenosis. IMPRESSION: 1. L3-L4 small right asymmetric disc bulge causing mild central spinal canal stenosis, narrowing of both lateral recesses, and moderate right neural foraminal stenosis, worsened compared to prior study. 2. L1-L2 small disc bulge with endplate spurring causing moderate right neural foraminal stenosis and right greater than left lateral recess narrowing, without central spinal canal stenosis. 3. L5-S1 small disc bulge with endplate spurring and moderate left foraminal stenosis, without spinal canal stenosis. 4. L4-L5 small disc bulge with mild bilateral foraminal stenosis, without spinal canal stenosis. 5. Status post L2-L3 posterior lumbar interbody fusion, without residual central spinal canal or neural foraminal stenosis. Electronically signed by: Franky Stanford MD MD 09/20/2024 01:13 AM EST RP Workstation: [id number]     PHYSICAL EXAM  Temp:  [97.7 F (36.5 C)-98.6 F (37 C)] 98 F (36.7 C) (01/18 1015) Pulse Rate:  [65-99] 99 (01/18 0800) Resp:  [15-25] 22 (01/18 0800) BP: (111-178)/(50-81) 120/67 (01/18 0800) SpO2:  [94 %-100 %] 100 % (01/18 0800) Weight:  [58 kg-59 kg] 59 kg (01/17 1638)  General - Well nourished, well developed, in no apparent distress.  Ophthalmologic - fundi not visualized due to  noncooperation.  Cardiovascular - Regular rhythm and rate.  Mental Status -  Level of arousal and orientation to time, place, and person were intact. Language including expression, naming, repetition, comprehension was assessed and found intact. Attention span and concentration were normal. Fund of Knowledge was assessed and was  intact.  Cranial Nerves II - XII - II - Visual field intact OU. III, IV, VI - Extraocular movements intact. V - Facial sensation intact bilaterally. VII - Facial movement intact bilaterally. VIII - Hearing & vestibular intact bilaterally. X - Palate elevates symmetrically. XI - Chin turning & shoulder shrug intact bilaterally. XII - Tongue protrusion intact.  Motor Strength - The patients strength was normal in all extremities and pronator drift was absent.  Bulk was normal and fasciculations were absent.   Motor Tone - Muscle tone was assessed at the neck and appendages and was normal.  Reflexes - The patients reflexes were symmetrical in all extremities and she had no pathological reflexes.  Sensory - Light touch, temperature/pinprick were assessed and were symmetrical except LLE sensation mildly decreased due to chronic neuropathy.    Coordination - The patient had normal movements in the hands and feet with no ataxia or dysmetria.  Tremor was absent.  Gait and Station - deferred.   ASSESSMENT/PLAN Ms. Sebrena Zollars is a 84 y.o. female with history of PAF off AC, takotsubo cardiomyopathy, MI, neuropathy admitted for episode of aphasia, double vision, blurry vision. No TNK given due to symptos resolved.    TIA:  likely left brain TIA could be secondary to PAF off AC CT no acute finding CTA head and neck b/l ICA bulb atherosclerosis MRI  no acute infarct 2D Echo  pending LDL 63 HgbA1c 5.5 UDS neg Lovenox  for VTE prophylaxis No antithrombotic prior to admission, now on aspirin  81 mg daily and clopidogrel  75 mg daily. Will switch DAPT to eliquis  2.5mg  bid for stroke prevention Patient counseled to be compliant with her antithrombotic medications Ongoing aggressive stroke risk factor management Therapy recommendations:  none Disposition:  home  PAF Found to have PAF in 12/2023, was put on eliquis  2.5 bid Had cataract surgery in 06/2024, did not resume eliquis  later Given  current possible TIA episode, pt CHA2DS2-VASc score = 5 Will resume eliquis  2.5 bid for stroke prevention Pt is in agreement Follow up with Dr. Anner cardiology  Hypertension High on presentation Stable now Long term BP goal normotensive  Hyperlipidemia Home meds:  Repatha   LDL 63, goal < 70 Continue home Repatha  at discharge  Other Stroke Risk Factors Advanced age  Other Active Problems Neuropathy - low risk of fall, fall precautions  Hospital day # 0  Neurology will sign off. Please call with questions. Pt will follow up with stroke clinic NP at [facility] in about 4 weeks. Thanks for the consult.   Ary Cummins, MD PhD Stroke Neurology 09/26/2024 10:42 AM    To contact Stroke Continuity provider, please refer to [email or web address]. After hours, contact General Neurology

## 2024-09-26 NOTE — Procedures (Signed)
 Patient Name: [name] Trujillo  MRN: [id number]  Epilepsy Attending: Arlin MALVA Krebs  Referring Physician/Provider: [name], MD Date: 09/26/2024 Duration: 22.34 mins  Patient history: 84yo F with complaints of double vision after taking a shower and difficulty finding words this morning. EEG to evaluate for seizure  Level of alertness: Awake  AEDs during EEG study: None  Technical aspects: This EEG study was done with scalp electrodes positioned according to the 10-20 International system of electrode placement. Electrical activity was reviewed with band pass filter of 1-70Hz , sensitivity of 7 uV/mm, display speed of 60mm/sec with a 60Hz  notched filter applied as appropriate. EEG data were recorded continuously and digitally stored.  Video monitoring was available and reviewed as appropriate.  Description: The posterior dominant rhythm consists of 9 Hz activity of moderate voltage (25-35 uV) seen predominantly in posterior head regions, symmetric and reactive to eye opening and eye closing. There is 15 to 18 Hz beta activity distributed symmetrically and diffusely. Hyperventilation and photic stimulation were not performed.     IMPRESSION: This study is within normal limits. No seizures or epileptiform discharges were seen throughout the recording.  A normal interictal EEG does not exclude the diagnosis of epilepsy.   Maelys Kinnick O Charlisa Cham

## 2024-09-28 ENCOUNTER — Encounter: Payer: Self-pay | Admitting: Cardiology

## 2024-09-29 ENCOUNTER — Ambulatory Visit: Attending: Cardiology | Admitting: Cardiology

## 2024-09-29 ENCOUNTER — Encounter: Payer: Self-pay | Admitting: Cardiology

## 2024-09-29 VITALS — BP 131/62 | HR 79 | Ht 61.0 in | Wt 126.7 lb

## 2024-09-29 DIAGNOSIS — G459 Transient cerebral ischemic attack, unspecified: Secondary | ICD-10-CM

## 2024-09-29 DIAGNOSIS — T466X5D Adverse effect of antihyperlipidemic and antiarteriosclerotic drugs, subsequent encounter: Secondary | ICD-10-CM

## 2024-09-29 DIAGNOSIS — I3489 Other nonrheumatic mitral valve disorders: Secondary | ICD-10-CM

## 2024-09-29 DIAGNOSIS — M791 Myalgia, unspecified site: Secondary | ICD-10-CM | POA: Diagnosis not present

## 2024-09-29 DIAGNOSIS — I6523 Occlusion and stenosis of bilateral carotid arteries: Secondary | ICD-10-CM | POA: Diagnosis not present

## 2024-09-29 DIAGNOSIS — E785 Hyperlipidemia, unspecified: Secondary | ICD-10-CM

## 2024-09-29 DIAGNOSIS — D6869 Other thrombophilia: Secondary | ICD-10-CM | POA: Diagnosis not present

## 2024-09-29 DIAGNOSIS — I5181 Takotsubo syndrome: Secondary | ICD-10-CM | POA: Diagnosis not present

## 2024-09-29 DIAGNOSIS — I2510 Atherosclerotic heart disease of native coronary artery without angina pectoris: Secondary | ICD-10-CM

## 2024-09-29 DIAGNOSIS — I480 Paroxysmal atrial fibrillation: Secondary | ICD-10-CM | POA: Diagnosis not present

## 2024-09-29 MED ORDER — APIXABAN 2.5 MG PO TABS: 2.5000 mg | ORAL_TABLET | Freq: Two times a day (BID) | ORAL | 2 refills | Status: AC

## 2024-09-29 NOTE — Patient Instructions (Signed)
 Medication Instructions:   Continue Aspirin   81 mg And Clopidogrel  for next 21 days once completed  Then Start Eliquis  2.5 mg twice a day   *If you need a refill on your cardiac medications before your next appointment, please call your pharmacy*   Lab Work: Not needed    Testing/Procedures:  Not needed  Follow-Up: At [facility], you and your health needs are our priority.  As part of our continuing mission to provide you with exceptional heart care, we have created designated Provider Care Teams.  These Care Teams include your primary Cardiologist (physician) and Advanced Practice Providers (APPs -  Physician Assistants and Nurse Practitioners) who all work together to provide you with the care you need, when you need it.     Your next appointment:   6 month(s)  The format for your next appointment:   In Person  Provider:   Alm Clay, MD

## 2024-09-29 NOTE — Progress Notes (Signed)
 " Cardiology Office Note:  .   Date:  10/03/2024  ID:  [name] Trujillo, DOB September 08, 1941, MRN [id number] PCP: [name] Coy, MD  [facility] Providers Cardiologist:  [name] Clay, MD Cardiology APP:  [name] Trujillo, [name] Trujillo     Chief Complaint  Patient presents with   Hospitalization Follow-up    Recent TIA    Follow-up    68-month general cardiology follow-up    Patient Profile: .     [name] Trujillo is a  84 y.o. 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  Developed significant chest pain.  EF was 25 to 30%. => EF improved to 60 to 65% as of August 2023. Mild Mitral Prolapse Hyperlipidemia PVCs  I last saw [name] Trujillo in May 2024.  She is doing well.  She is already using a cane for balance while walking but when using wheelchair for further walking.  No exertional dyspnea.  She then saw [name] Beauvais, NP as a work in visit for exertional dyspnea on 10/03/2023.  She also noted having some episodes of feeling dizzy and fatigue.  They decided to check a 2D echo, CMP and CBC.  She continued doing physical therapy and was walking maybe half a mile a day on a graded incline. She saw [name] Trujillo in follow-up on 12/18/2023 and was still walking a 1/2-3/4 miles per day, doing yard work etc.  Exertional dyspnea notably improved.  Echo was reassuring.  PCP had recommended stress test evaluation.  This was deferred since she was feeling better.  She denied any palpitations.  3 days after this visit, she went to the ER not feeling well.  She was found to be in A-fib RVR by EMS that converted spontaneously to sinus rhythm after being given diltiazem x 2 by EMS.  In  the ER she was started on 2.5 mg twice daily Eliquis  (CHA2DS2-VASc score~3).  Her most recent cardiology visit was on December 30, 2023 with [name] Heinrich, PA in the A-fib clinic.  She was noted to be in sinus rhythm and had not felt any palpitations.  Was not a fan of taking Eliquis , but agreed to take it.  She was referred to [facility] lipid clinic and was seen by [name] Trujillo, [facility] for lipid management on March 05, 2024 based on her labs.  She has been intolerant of statins due to to myalgias and memory issues (pravastatin, atorvastatin , rosuvastatin  and ezetimibe).  She was started on Repatha .  Somewhat from PCP recommendations, with new diagnosis of A-fib she underwent a Cardiac Stress PET-(reviewed below) that was normal.  She was admitted on 09/25/2024 with what sounds like it was a TIA: She had a brief episode of dizziness and blurry vision with some expressive aphasia per her daughter.  Her previous notably improved upon EMS arrival but was still having a little bit of dizziness and blurred vision upon arrival to the ER.  No other symptoms noted.  She was admitted for monitoring with TIA workup (discharged the next day). Head CTA and brain MRI performed along with CTA head and neck were checked along with a 2D echo (although not echo with bubble). After discussion with the patient and her daughter,  original plans for her to stay on Eliquis  prophylaxis with A-fib diagnosis, there was a voice concern of possible falls due to unsteady gait. => The on-call neurologist then converted the plan to 3 weeks combination aspirin  plus Plavix  and then aspirin  alone.  This was with the acknowledgment that CHA2DS2-VASc score would be increased with a diagnosis of TIA to 5-6 (depending on whether history of CHF is counter)    Subjective  Discussed the use of AI scribe software for clinical note transcription with the patient, who gave verbal consent to proceed.  History of Present Illness [name] Trujillo is an  84 year old female who presents for follow-up after hospitalization for a transient ischemic attack.  She was hospitalized over the weekend due to a suspected transient ischemic attack (TIA) after experiencing diplopia and dysarthria. Her daughter called for medical attention. She had recently undergone cataract surgery and initially attributed her symptoms to this. During her hospital stay, she underwent an MRI, CT, EEG, and echocardiogram, all of which returned normal results.  She was initially started on Plavix  and aspirin , but there was a subsequent change to Eliquis , which was later reverted back to Plavix  and aspirin  after discussion with her daughter. She previously stopped taking Eliquis  due to concerns about balance and falling.  She has a history of atrial fibrillation, which was documented during a previous episode when her heart rate reached 190 bpm, requiring paramedic intervention. She was previously monitored for a month without findings of atrial fibrillation. No recent episodes of palpitations or irregular heartbeats have been noted.  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 She is very cautious to avoid falls, as her daughter is concerned about her balance and potential for falling.  Her daughter [name] Derry, MD is an Administrator, [employer] in [place] ). Per daughter's letter mentions orthopedic issues that impaired her balance and is high risk of falling.  Also with a consideration of need for intermittent anti-inflammatories, Eliquis  was stopped in October 2025. She then discussed the hospitalization surrounding the possible TIA.  She indicated that there were reassuring findings as far as MRI etc., PAF was felt to be the culprit.  She discussed her concerns about risk of falls etc. with the neurologist and hospitalist caring for [name] Trujillo and voiced her concern about being placed on Eliquis .  They therefore decided to  treat with aspirin  and Plavix .     Objective   Current Medications Include: Aspirin  and now Plavix  to complete 21 days.;  Losartan  12.5 mg daily, Toprol  12.5 mg daily, Repatha  140 mg daily.  Tylenol  is listed, but NSAIDs are not listed.  Studies Reviewed: SABRA   EKG Interpretation Date/Time:  Wednesday September 29 2024 14:46:37 EST Ventricular Rate:  80 PR Interval:  154 QRS Duration:  86 QT Interval:  356 QTC Calculation: 410 R Axis:   74  Text Interpretation: Normal sinus rhythm Normal ECG DELAYED R WAVE PROGRESSION When compared with ECG of 25-Sep-2024 16:18, Confirmed by Anner Lenis ([id number]) on 09/29/2024 3:26:55 PM    Labs scanned from PCP-August 11, 2024:  Has NMR panel, chemistries. Total cholesterol 157, LDL 78.  HDL 58.  LDL pattern is a.  LP(a) looks good at 23 as the apolipoprotein B.  LDL particle number of 1159 is just mildly elevated.  Lab Results  Component Value Date   CHOL 125 09/26/2024   HDL 47 09/26/2024   LDLCALC 63 09/26/2024   TRIG 76 09/26/2024  CHOLHDL 2.7 09/26/2024   Lab Results  Component Value Date   NA 143 09/26/2024   K 3.7 09/26/2024   CREATININE 0.80 09/26/2024   GFRNONAA >60 09/26/2024   GLUCOSE 105 (H) 09/26/2024   Lab Results  Component Value Date   HGBA1C 5.5 09/25/2024   Lab Results  Component Value Date   WBC 6.3 09/26/2024   HGB 12.6 09/26/2024   HCT 38.1 09/26/2024   MCV 96.9 09/26/2024   PLT 248 09/26/2024   Results Radiology Brain MRI (09/25/2024): No acute infarct Head CT (09/25/2024): No acute infarct CT angiography neck (09/25/2024): Atherosclerotic changes at right carotid bifurcation, proximal right internal carotid artery, and left carotid bifurcation without stenosis >50%; no large vessel occlusion  Diagnostic Transthoracic Echocardiogram (09/26/2024): Normal LV size and function with EF 55 to 60%.  No RWMA.  G1 DD.  Normal PAP.  Mild MR.  AV sclerosis.  Echocardiogram 10/24/2023: Normal EF 60 to 65%.  No RWMA.   G1 DD.  Moderate late bileaflet MVP.  Normal RAP. Stress PET 9(/2025): NORMAL STUDY.  LOW RISK.  Normal rest and stress myocardial blood flow.  No high risk findings.  No ischemia or infarction.  Rest EF 69%.  Stress EF 71%.  Mild coronary calcification noted in LAD. Zio patch monitor (May 2025): Patient was in sinus rhythm with a rate range of 49 to 118 bpm and average of 73 bpm.  Rare isolated PACs and PVCs.  22 brief atrial runs ranging 4-15 beats with a rate range of 71 to 185 bpm and an average of 131 bpm.  Fastest was 4 beats with a max rate 185 bpm, longest was 15 beats with an average rate of 98 bpm.  No prolonged or sustained arrhythmias.  No A-fib.  Risk Assessment/Calculations:    CHA2DS2-VASc Score = 5   This indicates a 7.2% annual risk of stroke. The patient's score is based upon: CHF History: 0 HTN History: 0 Diabetes History: 0 Stroke History: 2 Vascular Disease History: 0 Age Score: 2 Gender Score: 1         Physical Exam:   VS:  BP 131/62 (BP Location: Left Arm, Patient Position: Sitting, Cuff Size: Normal)   Pulse 79   Ht 5' 1 (1.549 m)   Wt 126 lb 11.2 oz (57.5 kg)   SpO2 93%   BMI 23.94 kg/m    Wt Readings from Last 3 Encounters:  09/29/24 126 lb 11.2 oz (57.5 kg)  09/25/24 130 lb 1.1 oz (59 kg)  12/30/23 123 lb 3.2 oz (55.9 kg)     GEN: Well nourished, well groomed.  She is in no acute distress .  Appears younger than stated age NECK: No JVD; No carotid bruits CARDIAC: Normal S1, S2; RRR, no obvious murmurs.  No, rubs, gallops RESPIRATORY:  Clear to auscultation without rales, wheezing or rhonchi ; nonlabored, good air movement. ABDOMEN: Soft, non-tender, non-distended EXTREMITIES:  No edema; No deformity     ASSESSMENT AND PLAN: .   Takotsubo cardiomyopathy-resolved Resolved cardiomyopathy with no further issues.  Technically does not have CHF anymore. She remains on low-dose losartan  and and Toprol  at 12.5 mg each. She is not on a diuretic-either  loop diuretic or spironolactone .  Myxomatous degeneration of mitral valve No significant murmur heard.  1 echo suggested mild for leaflet prolapse, but other echoes have not shown.  Most recent echo did not suggest prolapse, only simply noted mild MR.  Will continue to monitor for change in murmur but  I suspect if there is no true MVP.  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  She has now recently had an episode that was suggestive of TIA (transient neurologic deficits that are subsequently recovered with no finding of CVA on MRI).  Given the recent diagnosis of TIA and well-documented A-fib, her CHA2DS2-VASc score is now at least 5.  The indication for aspirin  with short-term Plavix  therapy is clearly based on neurology data that is not familiar to me, but the risk of bleeding on combination of aspirin  Plavix  or even aspirin  alone is no more than it is on Eliquis  at a dose adjusted for her age, weight and renal function.   Unfortunately, aspirin  does not provide stroke prevention in the setting of A-fib to the extent that Eliquis  does and therefore is not a viable option for CVA protection.  [name] Trujillo also expressed to me that she has not had falls recently although her balance is somewhat unstable she is now using a walker more.  We had a long discussion about risks and benefits.  While I totally understand her daughters' concerns, I explained the logic on aspirin  versus Eliquis  or even aspirin  and Plavix  versus Eliquis  and that any of these combinations or options increased risk of bleeding concerns with falls with Eliquis  having likely better safety data than aspirin  (and certainly combination of aspirin  and Plavix ).  She understands both sides of concern and we compromised with the plan for at least 6 months on Eliquis  to  ensure no further events.  We can then readdress the issue in 6 months. I plan to contact her daughter to discuss our plan.  Recent TIA with documented AFib. No recent AFib episodes. Concerns about bleeding risk with anticoagulation due to fall risk. Eliquis  preferred for stroke prevention over aspirin  or Plavix  due to efficacy and safety. - Finish current course of Plavix  and aspirin . - Convert to Eliquis  for 6 months. - Reassess in 6 months for potential discontinuation of Eliquis  and resumption of aspirin .  PAF (paroxysmal atrial fibrillation) (HCC) 1 documented episode of PAF that was quite symptomatic.  Unfortunately we have not been or document any further episodes of A-fib.  The concerning feature of A-fib is that once the patient has shown substrate for A-fib, the very nature of a paroxysmal condition is that it recurs.  Not always are patient symptomatic with short episodes of A-fib, which is a concerning issue when it comes to her recent TIA.  She at baseline has well-controlled heart rate on very low-dose Toprol  12.5 mg daily. See discussion above about recommendations for returning to Eliquis  monotherapy as opposed to aspirin .  - Continue Toprol -XL 12.5 mg daily and monitor for bradycardia -Complete current 3-week course of Plavix , and then discontinue aspirin /Plavix  combination and revert to Eliquis  2.5 mg twice daily - Readdress anticoagulation concerns in 6 months based on recurrence of A-fib, stability of gait and/or other neurologic events.  TIA (transient ischemic attack) Recent TIA with no residual deficits. MRI and tests negative for stroke. Possible factors include cataract surgery and post-shower hypotension. Neurology recommended DAPT, but Eliquis  preferred for AFib-related stroke prevention. - Continue current management plan as discussed for AFib. - Continue Repatha  for lipid management  Coronary artery disease involving native heart without angina pectoris Artery  calcification noted.  Stress PET nonischemic. She is on Repatha  for lipid management  as well as low-dose ARB and beta-blocker. Continue current management.  Hyperlipidemia with target LDL less than 100 Previously she was doing well on rosuvastatin , but finally ended up allowing for statin holiday and had improvement in symptoms.  She therefore stopped statin.  After diagnosis of A-fib and concern for CAD she was referred to lipid clinic and started on Repatha .  Most recent lipids show LDL of 63 which was improved from 78 in December.  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  Also intolerant of Zetia. -Now on Repatha  140 mg daily.  Carotid artery disease Atherosclerotic changes without stenosis >50%. Recent TIA unlikely related to carotid stenosis. - Continue blood pressure control and lipid management for carotid-related stroke prevention.   Orders Placed This Encounter  Procedures   EKG 12-Lead          Follow-Up: Return in about 6 months (around 03/29/2025) for 6 month follow-up with me, [employer].  I spent 69 minutes in the care of [name] Trujillo today including reviewing labs (1 minute), reviewing outside labs from PCP scanned (1 minute), reviewing studies (2 echoes, stress PET, event monitor, brain MRI and head CT/CTA reviewed.  Personally reviewed images of the echocardiograms and stress PET--15 minutes:), face to face time discussing treatment options (23 minutes), reviewing records from my last note as well as 3 APP notes, pharmacist note and hospitalization for TIA as well as the patient's daughter's letter (12 minutes), 17 minutes dictating, and documenting in the encounter.   Portions of this note were dictated using DRAGON voice recognition software. Please disregard any errors in transcription. This record has been created using Manufacturing engineer. Errors have been sought and corrected, but may not always be located. Such creation errors do not reflect on the standard of medical care.      Signed, [name] MICAEL Clay, MD, MS [name] Trujillo, M.D., M.S. Interventional Cardiologist  [facility] Pager # [phone]      "

## 2024-10-02 ENCOUNTER — Encounter: Payer: Self-pay | Admitting: Cardiology

## 2024-10-02 NOTE — Assessment & Plan Note (Signed)
 Resolved cardiomyopathy with no further issues.  Technically does not have CHF anymore. She remains on low-dose losartan  and and Toprol  at 12.5 mg each. She is not on a diuretic-either loop diuretic or spironolactone .

## 2024-10-02 NOTE — Assessment & Plan Note (Signed)
 No significant murmur heard.  1 echo suggested mild for leaflet prolapse, but other echoes have not shown.  Most recent echo did not suggest prolapse, only simply noted mild MR.  Will continue to monitor for change in murmur but I suspect if there is no true MVP.

## 2024-10-02 NOTE — Progress Notes (Incomplete)
 " Cardiology Office Note:  .   Date:  10/02/2024  ID:  [name] Trujillo, DOB 1941-08-11, MRN [id number] PCP: [name] Coy, MD  [facility] Providers Cardiologist:  [name] Clay, MD Cardiology APP:  [name] Jon Garre, PA { Click to update primary MD,subspecialty MD or APP then REFRESH:1}    Chief Complaint  Patient presents with   Hospitalization Follow-up    Recent TIA    Follow-up    66-month general cardiology follow-up    Patient Profile: .     [name] Trujillo is a *** 84 y.o. 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  Developed significant chest pain.  EF was 25 to 30%. => EF improved to 60 to 65% as of August 2023. Mild Mitral Prolapse Hyperlipidemia PVCs  I last saw [name] Trujillo in May 2024.  She is doing well.  She is already using a cane for balance while walking but when using wheelchair for further walking.  No exertional dyspnea.  She then saw [name] Beauvais, NP as a work in visit for exertional dyspnea on 10/03/2023.  She also noted having some episodes of feeling dizzy and fatigue.  They decided to check a 2D echo, CMP and CBC.  She continued doing physical therapy and was walking maybe half a mile a day on a graded incline. She saw [name] Trujillo in follow-up on 12/18/2023 and was still walking a 1/2-3/4 miles per day, doing yard work etc.  Exertional dyspnea notably improved.  Echo was reassuring.  PCP had recommended stress test evaluation.  This was deferred since she was feeling better.  She denied any palpitations.  3 days after this visit, she went to the ER not feeling well.  She was found to be in A-fib RVR by EMS that converted spontaneously to sinus rhythm after  being given diltiazem x 2 by EMS.  In the ER she was started on 2.5 mg twice daily Eliquis  (CHA2DS2-VASc score~3).  Her most recent cardiology visit was on December 30, 2023 with [name] Heinrich, PA in the A-fib clinic.  She was noted to be in sinus rhythm and had not felt any palpitations.  Was not a fan of taking Eliquis , but agreed to take it.  She was referred to [facility] lipid clinic and was seen by [name] Trujillo, [facility] for lipid management on March 05, 2024 based on her labs.  She has been intolerant of statins due to to myalgias and memory issues (pravastatin, atorvastatin , rosuvastatin  and ezetimibe).  She was started on Repatha .  Somewhat from PCP recommendations, with new diagnosis of A-fib she underwent a Cardiac Stress PET-(reviewed below) that was normal.  She was admitted on 09/25/2024 with what sounds like it was a TIA: She had a brief episode of dizziness and blurry vision with some expressive aphasia per her daughter.  Her previous notably improved upon EMS arrival but was still having a little bit of dizziness and blurred vision upon arrival to the ER.  No other symptoms noted.  She was admitted for monitoring with TIA workup (discharged the next day). Head CTA and brain MRI performed along with CTA head and neck were checked along with a 2D echo (although not echo with bubble). After  discussion with the patient and her daughter, original plans for her to stay on Eliquis  prophylaxis with A-fib diagnosis, there was a voice concern of possible falls due to unsteady gait. => The on-call neurologist then converted the plan to 3 weeks combination aspirin  plus Plavix  and then aspirin  alone.  This was with the acknowledgment that CHA2DS2-VASc score would be increased with a diagnosis of TIA to 5-6 (depending on whether history of CHF is counter)       Subjective  Discussed the use of AI scribe software for clinical note transcription with the patient, who gave verbal consent to  proceed.  History of Present Illness [name] Trujillo is an 84 year old female who presents for follow-up after hospitalization for a transient ischemic attack.  She was hospitalized over the weekend due to a suspected transient ischemic attack (TIA) after experiencing diplopia and dysarthria. Her daughter called for medical attention. She had recently undergone cataract surgery and initially attributed her symptoms to this. During her hospital stay, she underwent an MRI, CT, EEG, and echocardiogram, all of which returned normal results.  She was initially started on Plavix  and aspirin , but there was a subsequent change to Eliquis , which was later reverted back to Plavix  and aspirin  after discussion with her daughter. She previously stopped taking Eliquis  due to concerns about balance and falling.  She has a history of atrial fibrillation, which was documented during a previous episode when her heart rate reached 190 bpm, requiring paramedic intervention. She was previously monitored for a month without findings of atrial fibrillation. No recent episodes of palpitations or irregular heartbeats have been noted.  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 She is very cautious to avoid falls, as her daughter is concerned about her balance and potential for falling.  Her daughter [name] Derry, MD is an Internist in [place] ). Per daughter's letter mentions orthopedic issues that impaired her balance and is high risk of falling.  Also with a consideration of need for intermittent anti-inflammatories, Eliquis  was stopped in October 2025. She then discussed the hospitalization surrounding the possible TIA.  She indicated that there were reassuring findings as far as MRI etc., PAF was felt to be the culprit.  She discussed her concerns about risk of falls etc. with the neurologist and hospitalist caring for [name] Trujillo and voiced her  concern about being placed on Eliquis .  They therefore decided to treat with aspirin  and Plavix .     Objective   Current Medications Include: Aspirin  and now Plavix  to complete 21 days.;  Losartan  12.5 mg daily, Toprol  12.5 mg daily, Repatha  140 mg daily.  Tylenol  is listed, but NSAIDs are not listed.  Studies Reviewed: SABRA   EKG Interpretation Date/Time:  Wednesday September 29 2024 14:46:37 EST Ventricular Rate:  80 PR Interval:  154 QRS Duration:  86 QT Interval:  356 QTC Calculation: 410 R Axis:   74  Text Interpretation: Normal sinus rhythm Normal ECG DELAYED R WAVE PROGRESSION When compared with ECG of 25-Sep-2024 16:18, Confirmed by [name] Trujillo ([id number]) on 09/29/2024 3:26:55 PM    Labs scanned from PCP-August 11, 2024:  Has NMR panel, chemistries. Total cholesterol 157, LDL 78.  HDL 58.  LDL pattern is a.  LP(a) looks good at 23 as the apolipoprotein B.  LDL particle number of 1159 is just mildly elevated.  Lab Results  Component Value Date   CHOL 125 09/26/2024   HDL 47 09/26/2024  LDLCALC 63 09/26/2024   TRIG 76 09/26/2024   CHOLHDL 2.7 09/26/2024   Lab Results  Component Value Date   NA 143 09/26/2024   K 3.7 09/26/2024   CREATININE 0.80 09/26/2024   GFRNONAA >60 09/26/2024   GLUCOSE 105 (H) 09/26/2024   Lab Results  Component Value Date   HGBA1C 5.5 09/25/2024   Lab Results  Component Value Date   WBC 6.3 09/26/2024   HGB 12.6 09/26/2024   HCT 38.1 09/26/2024   MCV 96.9 09/26/2024   PLT 248 09/26/2024   Results Radiology Brain MRI (09/25/2024): No acute infarct Head CT (09/25/2024): No acute infarct CT angiography neck (09/25/2024): Atherosclerotic changes at right carotid bifurcation, proximal right internal carotid artery, and left carotid bifurcation without stenosis >50%; no large vessel occlusion  Diagnostic Transthoracic Echocardiogram (09/26/2024): Normal LV size and function with EF 55 to 60%.  No RWMA.  G1 DD.  Normal PAP.  Mild MR.  AV  sclerosis.  Echocardiogram 10/24/2023: Normal EF 60 to 65%.  No RWMA.  G1 DD.  Moderate late bileaflet MVP.  Normal RAP. Stress PET 9(/2025): NORMAL STUDY.  LOW RISK.  Normal rest and stress myocardial blood flow.  No high risk findings.  No ischemia or infarction.  Rest EF 69%.  Stress EF 71%.  Mild coronary calcification noted in LAD. Zio patch monitor (May 2025): Patient was in sinus rhythm with a rate range of 49 to 118 bpm and average of 73 bpm.  Rare isolated PACs and PVCs.  22 brief atrial runs ranging 4-15 beats with a rate range of 71 to 185 bpm and an average of 131 bpm.  Fastest was 4 beats with a max rate 185 bpm, longest was 15 beats with an average rate of 98 bpm.  No prolonged or sustained arrhythmias.  No A-fib.  Risk Assessment/Calculations:    CHA2DS2-VASc Score = 5  {Confirm score is correct.  If not, click here to update score.  REFRESH note.  :1} This indicates a 7.2% annual risk of stroke. The patient's score is based upon: CHF History: 0 HTN History: 0 Diabetes History: 0 Stroke History: 2 Vascular Disease History: 0 Age Score: 2 Gender Score: 1   {This patient has a significant risk of stroke if diagnosed with atrial fibrillation.  Please consider VKA or DOAC agent for anticoagulation if the bleeding risk is acceptable.   You can also use the SmartPhrase .[email or web address] for documentation.   :[phone]}       Physical Exam:   VS:  BP 131/62 (BP Location: Left Arm, Patient Position: Sitting, Cuff Size: Normal)   Pulse 79   Ht 5' 1 (1.549 m)   Wt 126 lb 11.2 oz (57.5 kg)   SpO2 93%   BMI 23.94 kg/m    Wt Readings from Last 3 Encounters:  09/29/24 126 lb 11.2 oz (57.5 kg)  09/25/24 130 lb 1.1 oz (59 kg)  12/30/23 123 lb 3.2 oz (55.9 kg)     GEN: Well nourished, well groomed.  She is in no acute distress .  Appears younger than stated age NECK: No JVD; No carotid bruits CARDIAC: Normal S1, S2; RRR, no obvious murmurs.  No, rubs, gallops RESPIRATORY:  Clear to  auscultation without rales, wheezing or rhonchi ; nonlabored, good air movement. ABDOMEN: Soft, non-tender, non-distended EXTREMITIES:  No edema; No deformity      ASSESSMENT AND PLAN: .   No problem-specific Assessment & Plan notes found for this encounter.   Orders Placed This  Encounter  Procedures   EKG 12-Lead    Assessment and Plan Assessment & Plan Paroxysmal atrial fibrillation   Transient ischemic attack Recent TIA with no residual deficits. MRI and tests negative for stroke. Possible factors include cataract surgery and post-shower hypotension. Neurology recommended DAPT, but Eliquis  preferred for AFib-related stroke prevention. - Continue current management plan as discussed for AFib.  Carotid atherosclerosis without significant stenosis Atherosclerotic changes without stenosis >50%. Recent TIA unlikely related to carotid stenosis. - Continue aspirin  for carotid-related stroke prevention.  Recording duration: 23 minutes          Follow-Up: Return in about 6 months (around 03/29/2025).  I spent *** minutes in the care of [name] Trujillo today including {CHL AMB CAR Time Based Billing Options STW (Optional):[phone]::documenting in the encounter.}   Portions of this note were dictated using DRAGON voice recognition software. Please disregard any errors in transcription. This record has been created using Conservation officer, historic buildings. Errors have been sought and corrected, but may not always be located. Such creation errors do not reflect on the standard of medical care.    Signed, [name] MICAEL Clay, MD, MS [name] Trujillo, M.D., M.S. Interventional Cardiologist  [facility] Pager # [phone]      "

## 2024-10-03 ENCOUNTER — Encounter: Payer: Self-pay | Admitting: Cardiology

## 2024-10-03 DIAGNOSIS — I779 Disorder of arteries and arterioles, unspecified: Secondary | ICD-10-CM | POA: Insufficient documentation

## 2024-10-03 NOTE — Assessment & Plan Note (Signed)
 Difficult scenario in a patient who had a clearly documented episode of A-fib that was quite symptomatic, but has not any further symptomatic episodes.  She has now recently had an episode that was suggestive of TIA (transient neurologic deficits that are subsequently recovered with no finding of CVA on MRI).  Given the recent diagnosis of TIA and well-documented A-fib, her CHA2DS2-VASc score is now at least 5.  The indication for aspirin  with short-term Plavix  therapy is clearly based on neurology data that is not familiar to me, but the risk of bleeding on combination of aspirin  Plavix  or even aspirin  alone is no more than it is on Eliquis  at a dose adjusted for her age, weight and renal function.   Unfortunately, aspirin  does not provide stroke prevention in the setting of A-fib to the extent that Eliquis  does and therefore is not a viable option for CVA protection.  Niels also expressed to me that she has not had falls recently although her balance is somewhat unstable she is now using a walker more.  We had a long discussion about risks and benefits.  While I totally understand her daughters' concerns, I explained the logic on aspirin  versus Eliquis  or even aspirin  and Plavix  versus Eliquis  and that any of these combinations or options increased risk of bleeding concerns with falls with Eliquis  having likely better safety data than aspirin  (and certainly combination of aspirin  and Plavix ).  She understands both sides of concern and we compromised with the plan for at least 6 months on Eliquis  to ensure no further events.  We can then readdress the issue in 6 months. I plan to contact her daughter to discuss our plan.  Recent TIA with documented AFib. No recent AFib episodes. Concerns about bleeding risk with anticoagulation due to fall risk. Eliquis  preferred for stroke prevention over aspirin  or Plavix  due to efficacy and safety. - Finish current course of Plavix  and aspirin . - Convert to Eliquis   for 6 months. - Reassess in 6 months for potential discontinuation of Eliquis  and resumption of aspirin .

## 2024-10-03 NOTE — Assessment & Plan Note (Signed)
 1 documented episode of PAF that was quite symptomatic.  Unfortunately we have not been or document any further episodes of A-fib.  The concerning feature of A-fib is that once the patient has shown substrate for A-fib, the very nature of a paroxysmal condition is that it recurs.  Not always are patient symptomatic with short episodes of A-fib, which is a concerning issue when it comes to her recent TIA.  She at baseline has well-controlled heart rate on very low-dose Toprol  12.5 mg daily. See discussion above about recommendations for returning to Eliquis  monotherapy as opposed to aspirin .  - Continue Toprol -XL 12.5 mg daily and monitor for bradycardia -Complete current 3-week course of Plavix , and then discontinue aspirin /Plavix  combination and revert to Eliquis  2.5 mg twice daily - Readdress anticoagulation concerns in 6 months based on recurrence of A-fib, stability of gait and/or other neurologic events.

## 2024-10-03 NOTE — Assessment & Plan Note (Signed)
 Intolerant of multiple statins (pravastatin, atorvastatin  and rosuvastatin ).  Also intolerant of Zetia. -Now on Repatha  140 mg daily.

## 2024-10-03 NOTE — Assessment & Plan Note (Signed)
 Previously she was doing well on rosuvastatin , but finally ended up allowing for statin holiday and had improvement in symptoms.  She therefore stopped statin.  After diagnosis of A-fib and concern for CAD she was referred to lipid clinic and started on Repatha .  Most recent lipids show LDL of 63 which was improved from 78 in December.  -Continue Repatha  140 mg every 2 weeks

## 2024-10-03 NOTE — Assessment & Plan Note (Signed)
 Artery calcification noted.  Stress PET nonischemic. She is on Repatha  for lipid management as well as low-dose ARB and beta-blocker. Continue current management.

## 2024-10-03 NOTE — Assessment & Plan Note (Addendum)
 Recent TIA with no residual deficits. MRI and tests negative for stroke. Possible factors include cataract surgery and post-shower hypotension. Neurology recommended DAPT, but Eliquis  preferred for AFib-related stroke prevention. - Continue current management plan as discussed for AFib. - Continue Repatha  for lipid management

## 2024-10-03 NOTE — Assessment & Plan Note (Signed)
 Atherosclerotic changes without stenosis >50%. Recent TIA unlikely related to carotid stenosis. - Continue blood pressure control and lipid management for carotid-related stroke prevention.

## 2024-10-18 ENCOUNTER — Ambulatory Visit: Admitting: General Practice

## 2024-11-02 ENCOUNTER — Ambulatory Visit: Admitting: Neurological Surgery

## 2024-11-11 ENCOUNTER — Inpatient Hospital Stay: Admitting: Adult Health
